# Patient Record
Sex: Female | Born: 1970
Health system: Southern US, Community
[De-identification: ages and names within clinical notes are randomized; demographics above are authoritative.]

## PROBLEM LIST (undated history)

## (undated) DIAGNOSIS — M797 Fibromyalgia: Secondary | ICD-10-CM

## (undated) DIAGNOSIS — F32A Depression, unspecified: Secondary | ICD-10-CM

## (undated) DIAGNOSIS — I2699 Other pulmonary embolism without acute cor pulmonale: Secondary | ICD-10-CM

## (undated) DIAGNOSIS — G479 Sleep disorder, unspecified: Secondary | ICD-10-CM

## (undated) DIAGNOSIS — M2550 Pain in unspecified joint: Secondary | ICD-10-CM

## (undated) DIAGNOSIS — F419 Anxiety disorder, unspecified: Secondary | ICD-10-CM

## (undated) DIAGNOSIS — M1990 Unspecified osteoarthritis, unspecified site: Secondary | ICD-10-CM

## (undated) DIAGNOSIS — Z973 Presence of spectacles and contact lenses: Secondary | ICD-10-CM

## (undated) DIAGNOSIS — I639 Cerebral infarction, unspecified: Secondary | ICD-10-CM

## (undated) DIAGNOSIS — G459 Transient cerebral ischemic attack, unspecified: Secondary | ICD-10-CM

## (undated) DIAGNOSIS — N200 Calculus of kidney: Secondary | ICD-10-CM

## (undated) DIAGNOSIS — Z87442 Personal history of urinary calculi: Secondary | ICD-10-CM

## (undated) DIAGNOSIS — R112 Nausea with vomiting, unspecified: Secondary | ICD-10-CM

## (undated) DIAGNOSIS — S161XXA Strain of muscle, fascia and tendon at neck level, initial encounter: Secondary | ICD-10-CM

## (undated) DIAGNOSIS — Z9989 Dependence on other enabling machines and devices: Principal | ICD-10-CM

## (undated) DIAGNOSIS — F329 Major depressive disorder, single episode, unspecified: Secondary | ICD-10-CM

## (undated) DIAGNOSIS — J189 Pneumonia, unspecified organism: Secondary | ICD-10-CM

## (undated) DIAGNOSIS — S0990XA Unspecified injury of head, initial encounter: Secondary | ICD-10-CM

## (undated) DIAGNOSIS — I82409 Acute embolism and thrombosis of unspecified deep veins of unspecified lower extremity: Secondary | ICD-10-CM

## (undated) DIAGNOSIS — R7989 Other specified abnormal findings of blood chemistry: Secondary | ICD-10-CM

## (undated) DIAGNOSIS — R21 Rash and other nonspecific skin eruption: Secondary | ICD-10-CM

## (undated) DIAGNOSIS — E1042 Type 1 diabetes mellitus with diabetic polyneuropathy: Secondary | ICD-10-CM

## (undated) DIAGNOSIS — K219 Gastro-esophageal reflux disease without esophagitis: Secondary | ICD-10-CM

## (undated) DIAGNOSIS — R945 Abnormal results of liver function studies: Secondary | ICD-10-CM

## (undated) DIAGNOSIS — K828 Other specified diseases of gallbladder: Secondary | ICD-10-CM

## (undated) DIAGNOSIS — E785 Hyperlipidemia, unspecified: Secondary | ICD-10-CM

## (undated) DIAGNOSIS — M6702 Short Achilles tendon (acquired), left ankle: Secondary | ICD-10-CM

## (undated) DIAGNOSIS — F4310 Post-traumatic stress disorder, unspecified: Secondary | ICD-10-CM

## (undated) DIAGNOSIS — G4733 Obstructive sleep apnea (adult) (pediatric): Secondary | ICD-10-CM

## (undated) DIAGNOSIS — E162 Hypoglycemia, unspecified: Secondary | ICD-10-CM

## (undated) HISTORY — DX: Obstructive sleep apnea (adult) (pediatric): G47.33

## (undated) HISTORY — DX: Nausea with vomiting, unspecified: R11.2

## (undated) HISTORY — DX: Hyperlipidemia, unspecified: E78.5

## (undated) HISTORY — PX: COLONOSCOPY: SHX174

## (undated) HISTORY — PX: LIVER BIOPSY: SHX301

## (undated) HISTORY — PX: APPENDECTOMY: SHX54

## (undated) HISTORY — DX: Dependence on other enabling machines and devices: Z99.89

## (undated) HISTORY — PX: OTHER SURGICAL HISTORY: SHX169

## (undated) HISTORY — DX: Anxiety disorder, unspecified: F41.9

## (undated) HISTORY — DX: Other specified diseases of gallbladder: K82.8

## (undated) HISTORY — PX: DILATION AND CURETTAGE OF UTERUS: SHX78

---

## 1997-03-29 ENCOUNTER — Ambulatory Visit (HOSPITAL_COMMUNITY): Admission: RE | Admit: 1997-03-29 | Discharge: 1997-03-29 | Payer: Self-pay | Admitting: Obstetrics & Gynecology

## 1997-04-07 ENCOUNTER — Inpatient Hospital Stay (HOSPITAL_COMMUNITY): Admission: AD | Admit: 1997-04-07 | Discharge: 1997-04-07 | Payer: Self-pay | Admitting: *Deleted

## 1997-04-19 ENCOUNTER — Encounter: Admission: RE | Admit: 1997-04-19 | Discharge: 1997-07-18 | Payer: Self-pay | Admitting: Obstetrics

## 1997-04-24 ENCOUNTER — Inpatient Hospital Stay (HOSPITAL_COMMUNITY): Admission: AD | Admit: 1997-04-24 | Discharge: 1997-04-24 | Payer: Self-pay | Admitting: *Deleted

## 1997-05-02 ENCOUNTER — Ambulatory Visit (HOSPITAL_COMMUNITY): Admission: RE | Admit: 1997-05-02 | Discharge: 1997-05-02 | Payer: Self-pay | Admitting: Obstetrics and Gynecology

## 1997-07-11 ENCOUNTER — Inpatient Hospital Stay (HOSPITAL_COMMUNITY): Admission: AD | Admit: 1997-07-11 | Discharge: 1997-07-11 | Payer: Self-pay | Admitting: Obstetrics

## 1997-07-12 ENCOUNTER — Ambulatory Visit (HOSPITAL_COMMUNITY): Admission: RE | Admit: 1997-07-12 | Discharge: 1997-07-12 | Payer: Self-pay | Admitting: Obstetrics

## 1997-08-01 ENCOUNTER — Encounter: Admission: RE | Admit: 1997-08-01 | Discharge: 1997-10-30 | Payer: Self-pay | Admitting: Obstetrics & Gynecology

## 1997-08-06 ENCOUNTER — Encounter (HOSPITAL_COMMUNITY): Admission: RE | Admit: 1997-08-06 | Discharge: 1997-08-28 | Payer: Self-pay | Admitting: Obstetrics

## 1997-08-07 ENCOUNTER — Inpatient Hospital Stay (HOSPITAL_COMMUNITY): Admission: AD | Admit: 1997-08-07 | Discharge: 1997-08-07 | Payer: Self-pay | Admitting: *Deleted

## 1997-08-08 ENCOUNTER — Inpatient Hospital Stay (HOSPITAL_COMMUNITY): Admission: AD | Admit: 1997-08-08 | Discharge: 1997-08-13 | Payer: Self-pay | Admitting: Obstetrics

## 1997-08-17 ENCOUNTER — Ambulatory Visit (HOSPITAL_COMMUNITY): Admission: RE | Admit: 1997-08-17 | Discharge: 1997-08-17 | Payer: Self-pay | Admitting: Obstetrics & Gynecology

## 1997-08-21 ENCOUNTER — Observation Stay (HOSPITAL_COMMUNITY): Admission: AD | Admit: 1997-08-21 | Discharge: 1997-08-22 | Payer: Self-pay | Admitting: *Deleted

## 1997-08-25 ENCOUNTER — Inpatient Hospital Stay (HOSPITAL_COMMUNITY): Admission: AD | Admit: 1997-08-25 | Discharge: 1997-08-31 | Payer: Self-pay | Admitting: *Deleted

## 1997-10-16 ENCOUNTER — Encounter: Admission: RE | Admit: 1997-10-16 | Discharge: 1997-10-16 | Payer: Self-pay | Admitting: Obstetrics & Gynecology

## 1997-10-16 ENCOUNTER — Other Ambulatory Visit: Admission: RE | Admit: 1997-10-16 | Discharge: 1997-10-16 | Payer: Self-pay | Admitting: Obstetrics & Gynecology

## 1997-11-09 ENCOUNTER — Emergency Department (HOSPITAL_COMMUNITY): Admission: EM | Admit: 1997-11-09 | Discharge: 1997-11-09 | Payer: Self-pay | Admitting: Emergency Medicine

## 1997-12-22 ENCOUNTER — Emergency Department (HOSPITAL_COMMUNITY): Admission: EM | Admit: 1997-12-22 | Discharge: 1997-12-22 | Payer: Self-pay | Admitting: Emergency Medicine

## 1998-05-12 ENCOUNTER — Emergency Department (HOSPITAL_COMMUNITY): Admission: EM | Admit: 1998-05-12 | Discharge: 1998-05-12 | Payer: Self-pay | Admitting: Emergency Medicine

## 1998-05-15 ENCOUNTER — Encounter: Admission: RE | Admit: 1998-05-15 | Discharge: 1998-08-13 | Payer: Self-pay | Admitting: *Deleted

## 1999-01-20 HISTORY — PX: TONSILLECTOMY: SUR1361

## 1999-01-20 HISTORY — PX: ABDOMINAL HYSTERECTOMY: SHX81

## 1999-03-29 ENCOUNTER — Emergency Department (HOSPITAL_COMMUNITY): Admission: EM | Admit: 1999-03-29 | Discharge: 1999-03-29 | Payer: Self-pay | Admitting: Emergency Medicine

## 1999-04-18 ENCOUNTER — Emergency Department (HOSPITAL_COMMUNITY): Admission: EM | Admit: 1999-04-18 | Discharge: 1999-04-18 | Payer: Self-pay | Admitting: Emergency Medicine

## 2000-02-26 ENCOUNTER — Encounter: Payer: Self-pay | Admitting: Emergency Medicine

## 2000-02-26 ENCOUNTER — Emergency Department (HOSPITAL_COMMUNITY): Admission: EM | Admit: 2000-02-26 | Discharge: 2000-02-26 | Payer: Self-pay | Admitting: Emergency Medicine

## 2000-05-22 ENCOUNTER — Emergency Department (HOSPITAL_COMMUNITY): Admission: EM | Admit: 2000-05-22 | Discharge: 2000-05-22 | Payer: Self-pay | Admitting: Emergency Medicine

## 2000-11-03 ENCOUNTER — Emergency Department (HOSPITAL_COMMUNITY): Admission: EM | Admit: 2000-11-03 | Discharge: 2000-11-03 | Payer: Self-pay | Admitting: Emergency Medicine

## 2001-01-02 ENCOUNTER — Emergency Department (HOSPITAL_COMMUNITY): Admission: EM | Admit: 2001-01-02 | Discharge: 2001-01-02 | Payer: Self-pay | Admitting: Emergency Medicine

## 2001-01-02 ENCOUNTER — Encounter: Payer: Self-pay | Admitting: Emergency Medicine

## 2001-02-24 ENCOUNTER — Ambulatory Visit (HOSPITAL_COMMUNITY): Admission: RE | Admit: 2001-02-24 | Discharge: 2001-02-24 | Payer: Self-pay | Admitting: General Surgery

## 2001-02-24 ENCOUNTER — Encounter: Payer: Self-pay | Admitting: General Surgery

## 2001-04-25 ENCOUNTER — Ambulatory Visit (HOSPITAL_BASED_OUTPATIENT_CLINIC_OR_DEPARTMENT_OTHER): Admission: RE | Admit: 2001-04-25 | Discharge: 2001-04-25 | Payer: Self-pay | Admitting: General Surgery

## 2001-04-25 ENCOUNTER — Encounter (INDEPENDENT_AMBULATORY_CARE_PROVIDER_SITE_OTHER): Payer: Self-pay | Admitting: *Deleted

## 2001-05-18 ENCOUNTER — Emergency Department (HOSPITAL_COMMUNITY): Admission: EM | Admit: 2001-05-18 | Discharge: 2001-05-18 | Payer: Self-pay | Admitting: Emergency Medicine

## 2004-01-20 HISTORY — PX: EYE SURGERY: SHX253

## 2005-02-09 ENCOUNTER — Emergency Department (HOSPITAL_COMMUNITY): Admission: EM | Admit: 2005-02-09 | Discharge: 2005-02-10 | Payer: Self-pay | Admitting: *Deleted

## 2005-02-18 ENCOUNTER — Other Ambulatory Visit: Admission: RE | Admit: 2005-02-18 | Discharge: 2005-02-18 | Payer: Self-pay | Admitting: Gynecology

## 2005-03-27 ENCOUNTER — Ambulatory Visit (HOSPITAL_COMMUNITY): Admission: RE | Admit: 2005-03-27 | Discharge: 2005-03-27 | Payer: Self-pay | Admitting: *Deleted

## 2005-03-30 ENCOUNTER — Ambulatory Visit (HOSPITAL_COMMUNITY): Admission: RE | Admit: 2005-03-30 | Discharge: 2005-03-31 | Payer: Self-pay | Admitting: *Deleted

## 2005-03-30 ENCOUNTER — Encounter (INDEPENDENT_AMBULATORY_CARE_PROVIDER_SITE_OTHER): Payer: Self-pay | Admitting: *Deleted

## 2005-05-15 ENCOUNTER — Emergency Department (HOSPITAL_COMMUNITY): Admission: EM | Admit: 2005-05-15 | Discharge: 2005-05-15 | Payer: Self-pay | Admitting: Emergency Medicine

## 2005-05-16 ENCOUNTER — Ambulatory Visit (HOSPITAL_COMMUNITY): Admission: RE | Admit: 2005-05-16 | Discharge: 2005-05-16 | Payer: Self-pay | Admitting: Emergency Medicine

## 2005-05-16 ENCOUNTER — Encounter: Payer: Self-pay | Admitting: Vascular Surgery

## 2005-10-03 ENCOUNTER — Emergency Department (HOSPITAL_COMMUNITY): Admission: EM | Admit: 2005-10-03 | Discharge: 2005-10-03 | Payer: Self-pay | Admitting: Emergency Medicine

## 2005-10-16 ENCOUNTER — Encounter (INDEPENDENT_AMBULATORY_CARE_PROVIDER_SITE_OTHER): Payer: Self-pay | Admitting: Specialist

## 2005-10-16 ENCOUNTER — Ambulatory Visit (HOSPITAL_COMMUNITY): Admission: RE | Admit: 2005-10-16 | Discharge: 2005-10-17 | Payer: Self-pay | Admitting: Otolaryngology

## 2005-11-15 ENCOUNTER — Emergency Department (HOSPITAL_COMMUNITY): Admission: EM | Admit: 2005-11-15 | Discharge: 2005-11-15 | Payer: Self-pay | Admitting: Emergency Medicine

## 2005-12-14 ENCOUNTER — Encounter (INDEPENDENT_AMBULATORY_CARE_PROVIDER_SITE_OTHER): Payer: Self-pay | Admitting: Cardiovascular Disease

## 2005-12-14 ENCOUNTER — Observation Stay (HOSPITAL_COMMUNITY): Admission: EM | Admit: 2005-12-14 | Discharge: 2005-12-15 | Payer: Self-pay | Admitting: *Deleted

## 2005-12-14 ENCOUNTER — Ambulatory Visit: Payer: Self-pay | Admitting: *Deleted

## 2005-12-24 ENCOUNTER — Emergency Department (HOSPITAL_COMMUNITY): Admission: EM | Admit: 2005-12-24 | Discharge: 2005-12-24 | Payer: Self-pay | Admitting: Family Medicine

## 2006-01-16 ENCOUNTER — Emergency Department (HOSPITAL_COMMUNITY): Admission: EM | Admit: 2006-01-16 | Discharge: 2006-01-16 | Payer: Self-pay | Admitting: Emergency Medicine

## 2006-05-10 ENCOUNTER — Emergency Department (HOSPITAL_COMMUNITY): Admission: EM | Admit: 2006-05-10 | Discharge: 2006-05-10 | Payer: Self-pay | Admitting: Emergency Medicine

## 2006-05-13 ENCOUNTER — Emergency Department (HOSPITAL_COMMUNITY): Admission: EM | Admit: 2006-05-13 | Discharge: 2006-05-13 | Payer: Self-pay | Admitting: Emergency Medicine

## 2006-07-22 ENCOUNTER — Emergency Department (HOSPITAL_COMMUNITY): Admission: EM | Admit: 2006-07-22 | Discharge: 2006-07-23 | Payer: Self-pay | Admitting: Emergency Medicine

## 2006-08-24 ENCOUNTER — Emergency Department (HOSPITAL_COMMUNITY): Admission: EM | Admit: 2006-08-24 | Discharge: 2006-08-24 | Payer: Self-pay | Admitting: Emergency Medicine

## 2006-08-28 ENCOUNTER — Emergency Department (HOSPITAL_COMMUNITY): Admission: EM | Admit: 2006-08-28 | Discharge: 2006-08-28 | Payer: Self-pay | Admitting: Family Medicine

## 2006-12-13 ENCOUNTER — Emergency Department (HOSPITAL_COMMUNITY): Admission: EM | Admit: 2006-12-13 | Discharge: 2006-12-13 | Payer: Self-pay | Admitting: Family Medicine

## 2006-12-14 IMAGING — CT CT ABDOMEN W/ CM
3 of 5 series · 14 of 32 positions shown, 19 images · IV contrast (ORAL OMNI 350 & 100 ML OMNI 300)
Comparison: 11/03/00.

CLINICAL DATA: Abdominal pain.  Diabetic.  Patient with back pain.
ABDOMEN CT WITH CONTRAST:
TECHNIQUE: Multidetector CT imaging of the abdomen was performed following the standard protocol during bolus administration of intravenous contrast.
Contrast:  100 cc Omnipaque 300.
TECHNIQUE: Multidetector CT imaging of the pelvis was performed following the standard protocol during bolus administration of intravenous contrast.

[Series 2: routine abdomen · axial · 0.70mm/px · z∈[-355,-115]mm · 4 of 82 slices shown, 9 images]
[im 17/82  soft-tissue]
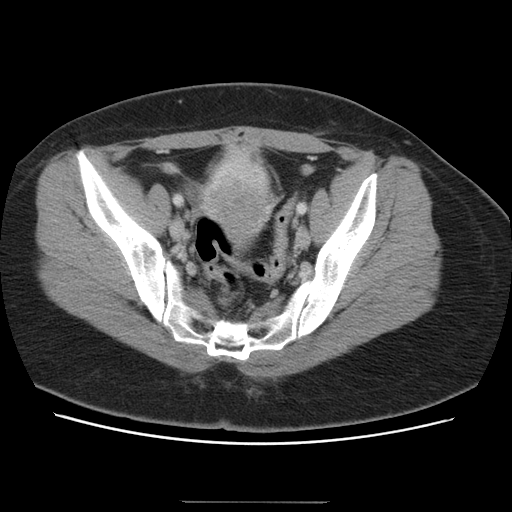
[im 17/82  lung]
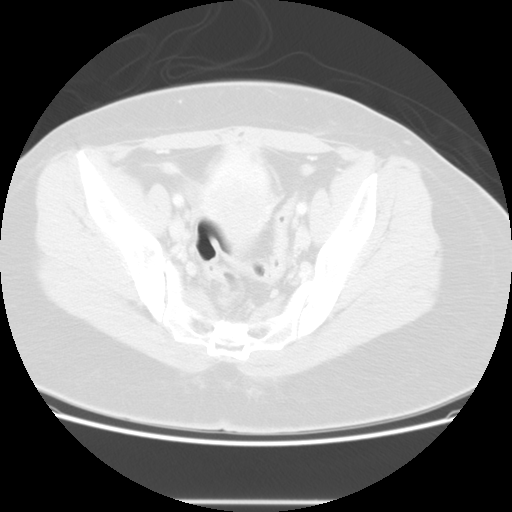
[im 17/82  bone]
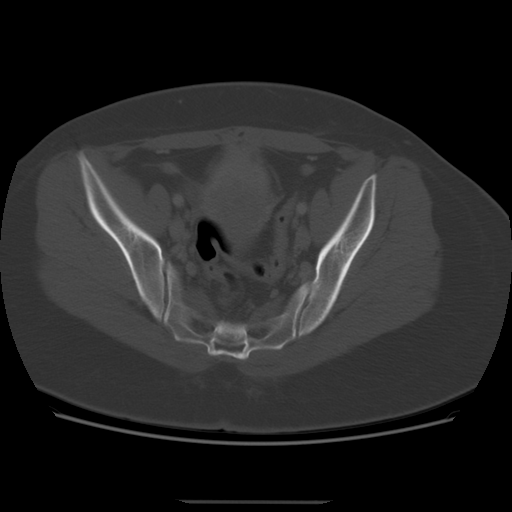
[im 33/82  soft-tissue]
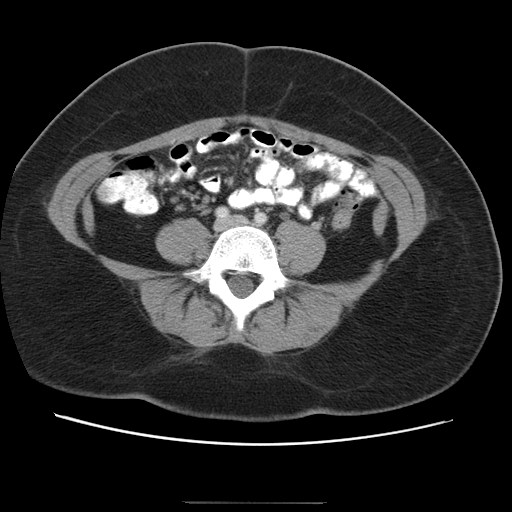
[im 33/82  lung]
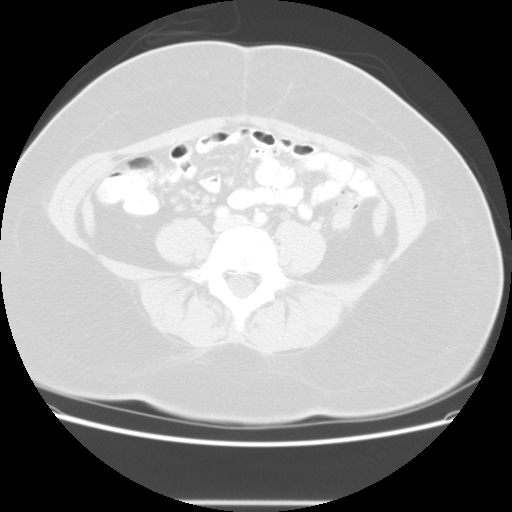
[im 49/82  soft-tissue]
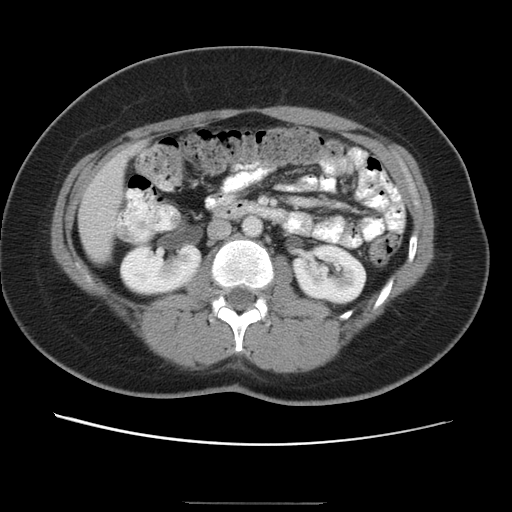
[im 49/82  lung]
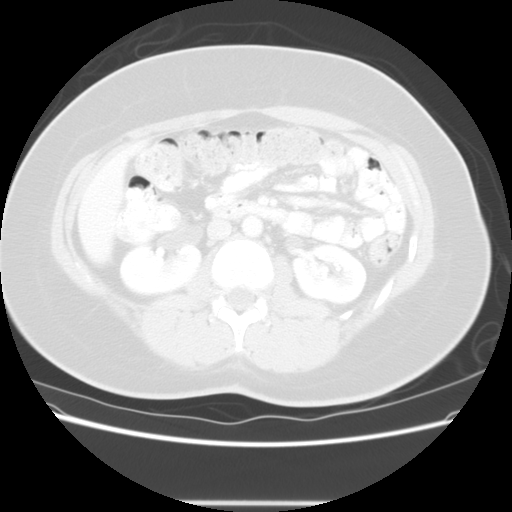
[im 65/82  soft-tissue]
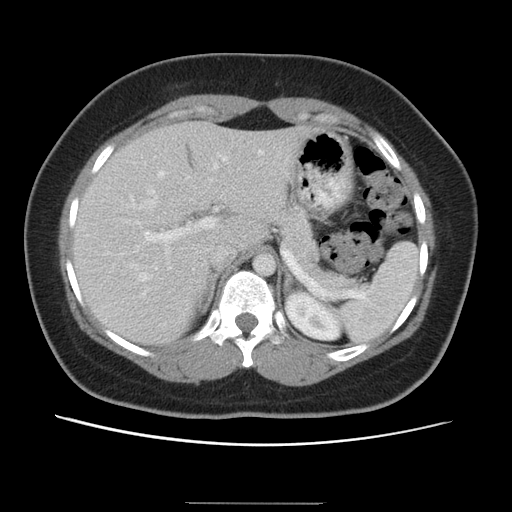
[im 65/82  lung]
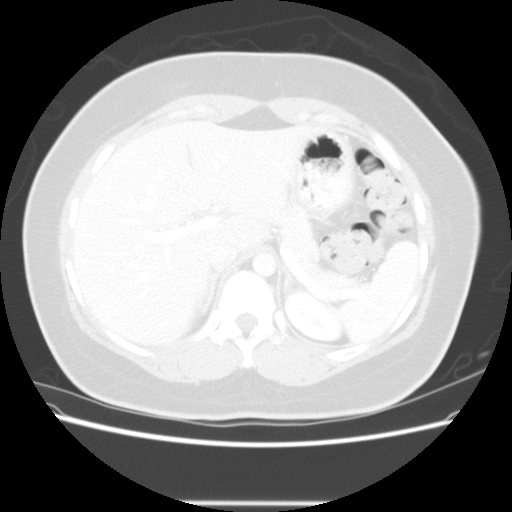

[Series 104: reformatted · sagittal · 0.88mm/px · 8 of 149 slices shown (1 of 2)]
[im 14/149  soft-tissue]
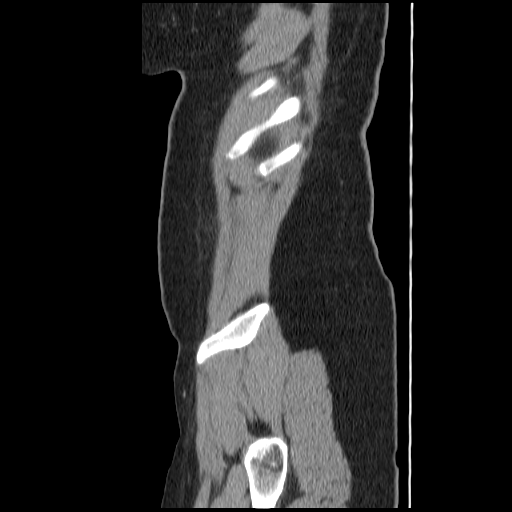
[im 27/149  soft-tissue]
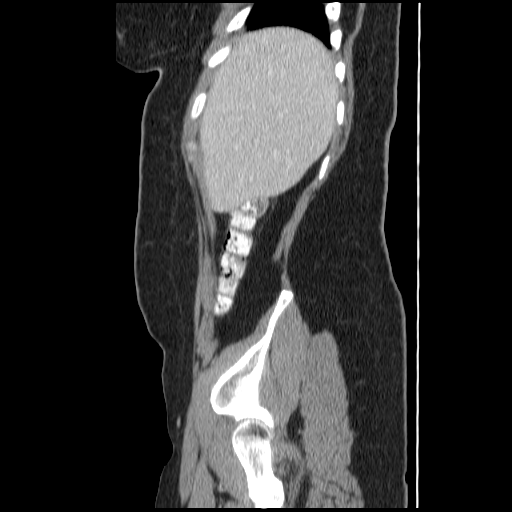
[im 54/149  soft-tissue]
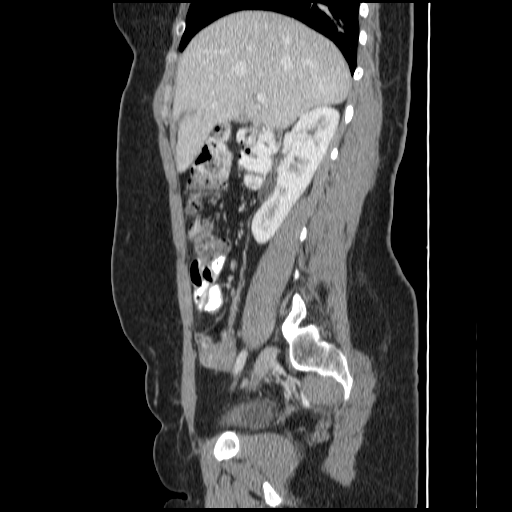
[im 68/149  soft-tissue]
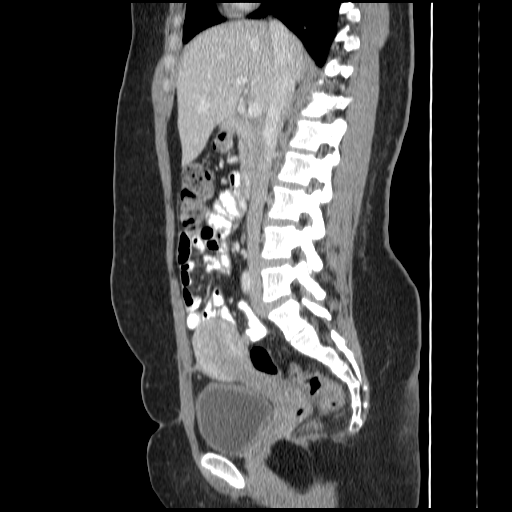
[im 81/149  soft-tissue]
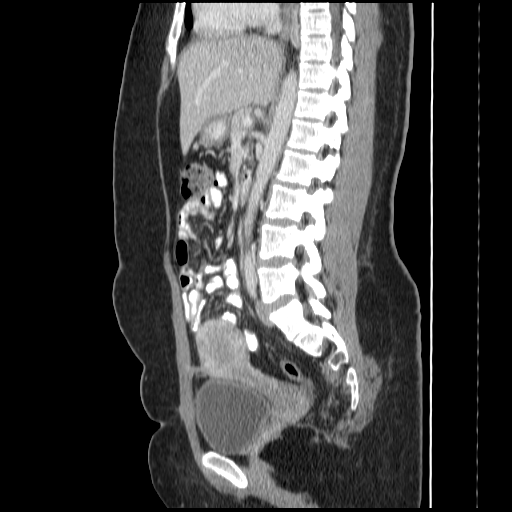
[im 95/149  soft-tissue]
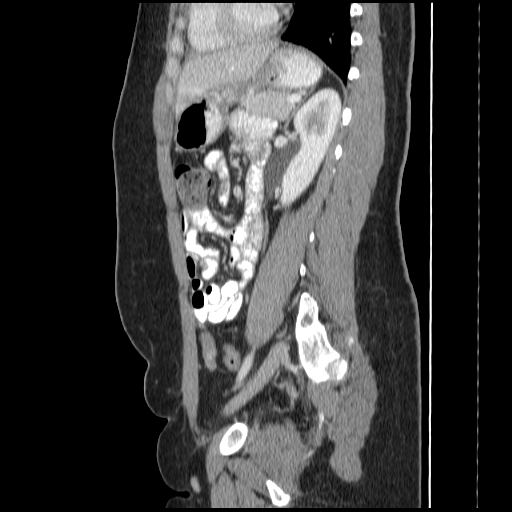
[im 122/149  soft-tissue]
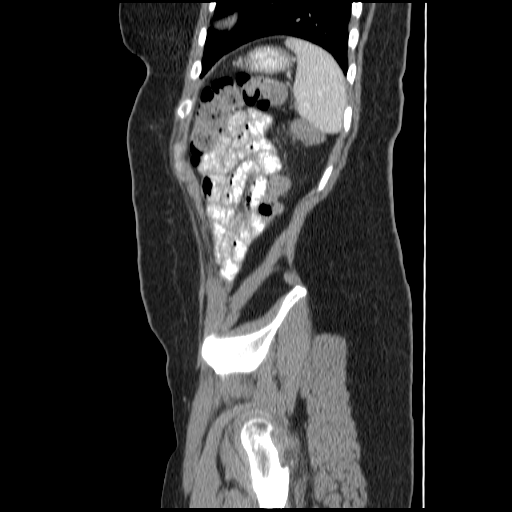
[im 135/149  soft-tissue]
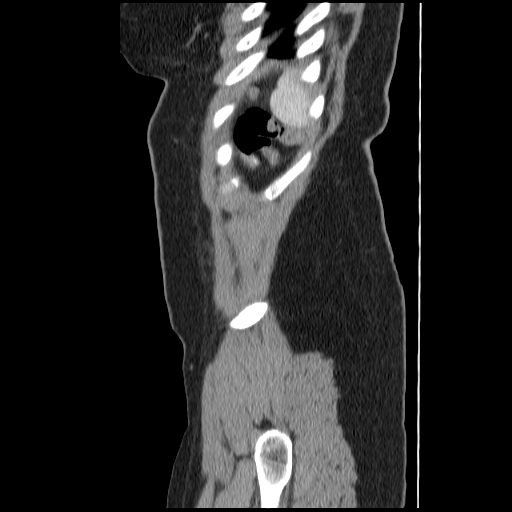

[Series 105: reformatted · coronal · 0.88mm/px · 2 of 111 slices shown (2 of 2)]
[im 14/111  soft-tissue]
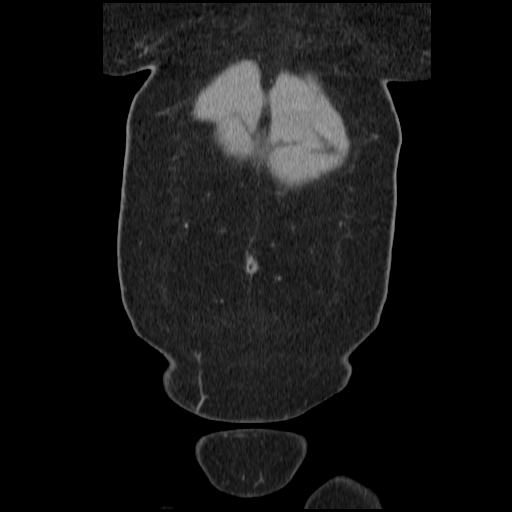
[im 28/111  soft-tissue]
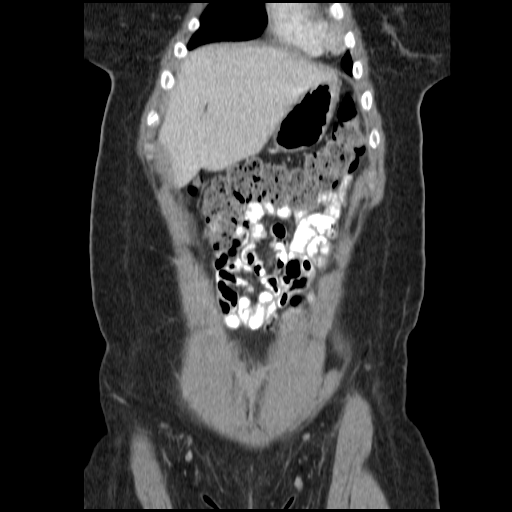

[14 of 32 positions shown; findings below may reference images not displayed]

FINDINGS: There is minimal dependent bibasilar subsegmental atelectasis.  No pleural or pericardial effusion.
The spleen, adrenal glands, gallbladder, pancreas, and liver all appear normal.  Right kidney demonstrates a somewhat anterior axis rotation, but is otherwise unremarkable.   Left kidney appears normal.  No abdominal lymphadenopathy or fluid collections.
IMPRESSION: Negative abdomen CT scan.
PELVIS CT WITH CONTRAST:
FINDINGS: A small amount of free pelvic fluid is identified which is likely physiologic.  Several small lymph nodes are identified in the right lower quadrant.  The appendix is visualized and is unremarkable.  No focal bony abnormality is identified.
IMPRESSION: No acute finding in the pelvis.

## 2007-01-28 IMAGING — CR DG CHEST 2V
2 series · 2 of 2 positions shown · non-contrast
Comparison: None.

CLINICAL DATA: Preoperative respiratory evaluation prior to gynecologic surgery.

CHEST - 2 VIEW  03/27/2005:

[view not recorded (1 of 2)]
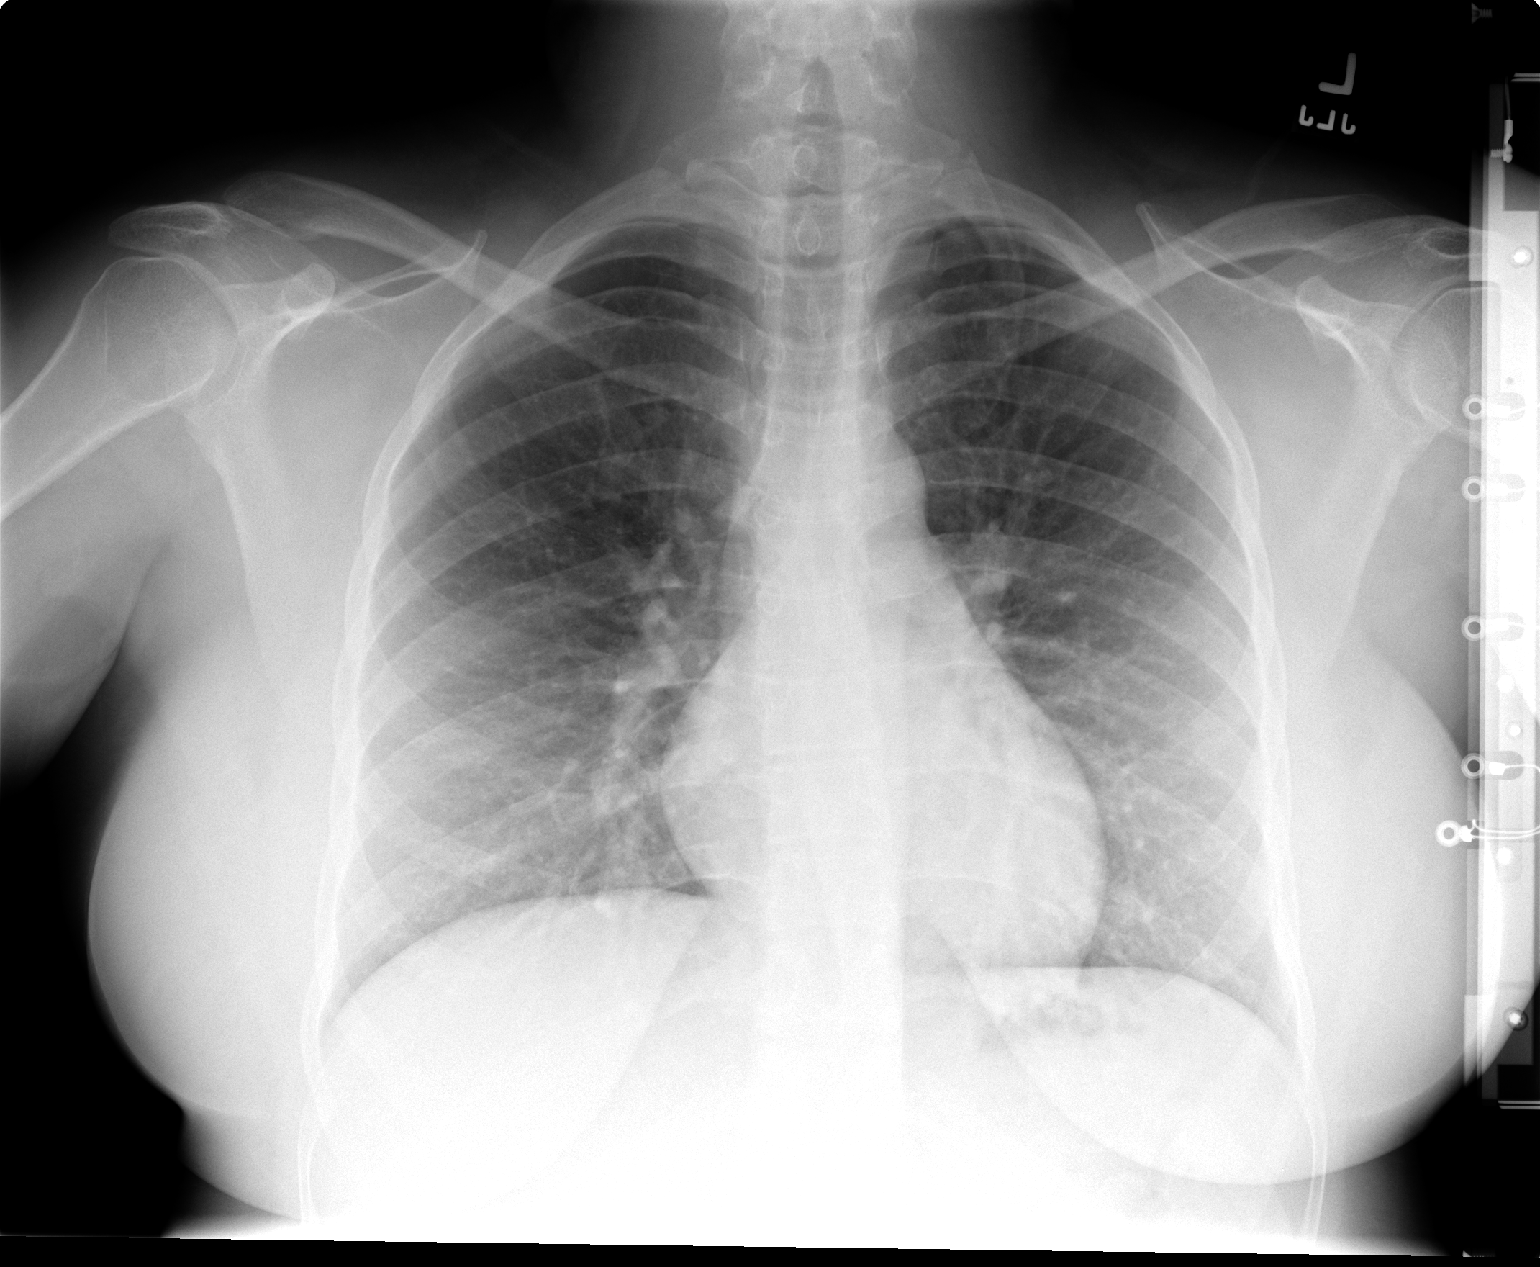

[view not recorded (2 of 2)]
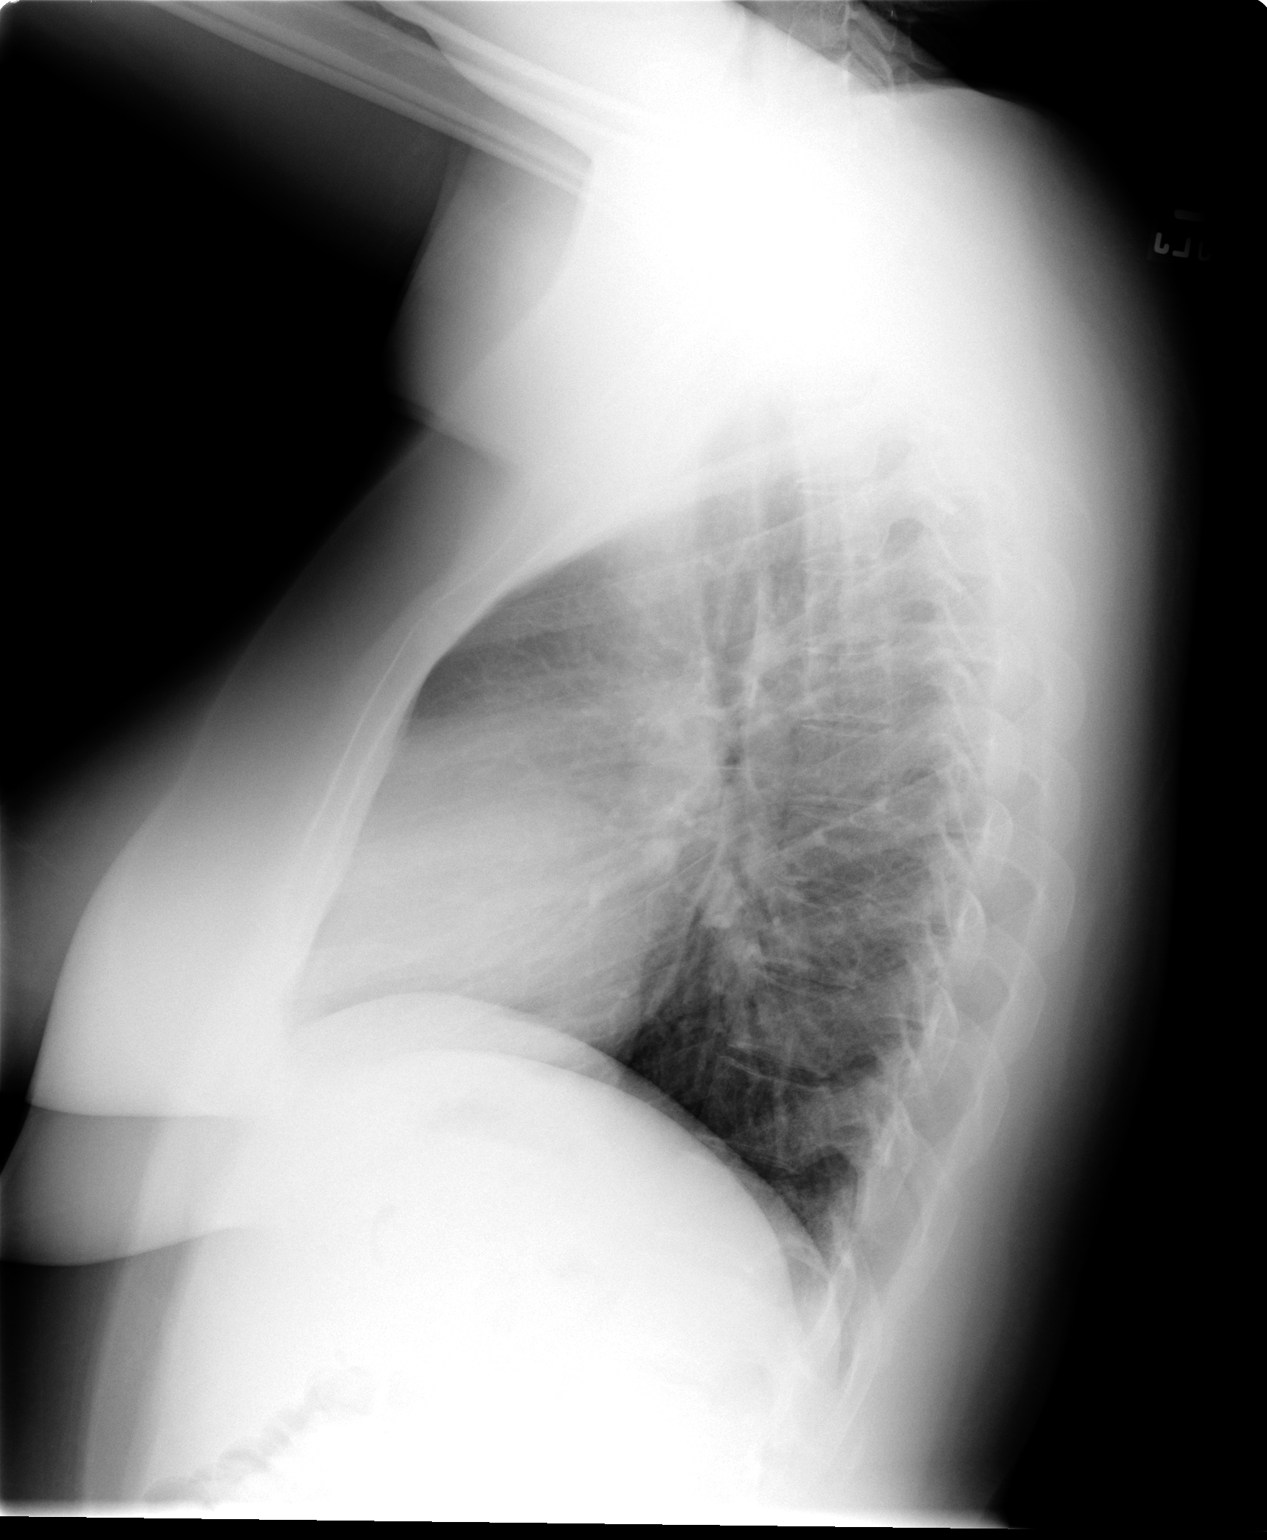

[2 of 2 positions shown; findings below may reference images not displayed]

FINDINGS: The cardiomediastinal silhouette is unremarkable. The lungs are
clear. There are no pleural effusions. The visualized bony thorax appears
intact.
IMPRESSION: Normal chest.

## 2007-01-28 IMAGING — CT CT PELVIS W/ CM
1 of 4 series · 14 of 32 positions shown, 19 images · IV contrast (omnipaque)
Comparison: 02/10/2005

ABDOMEN CT WITH CONTRAST

CLINICAL DATA: Elevated LFTs. Abnormal uterine bleeding with fibroids. Pelvic
pain.
TECHNIQUE: Multidetector CT imaging of the abdomen and pelvis was performed
following the standard protocol during bolus administration of intravenous
contrast.

Contrast:  125 cc Omnipaque 300

[Series 2: abd/pel 5.0 b30f · axial · 0.68mm/px · z∈[-438,-44]mm · 14 of 91 slices shown, 19 images]
[im 6/91  soft-tissue]
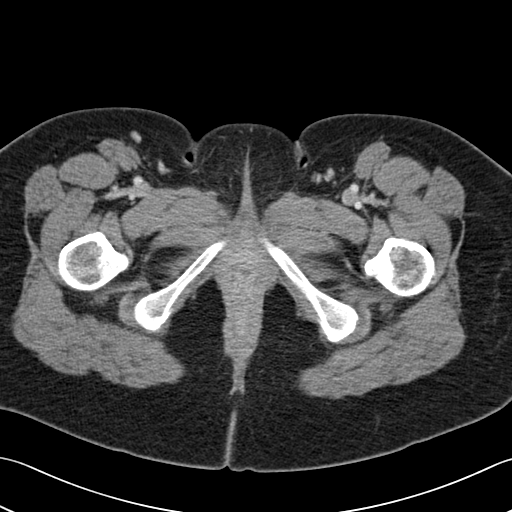
[im 6/91  bone]
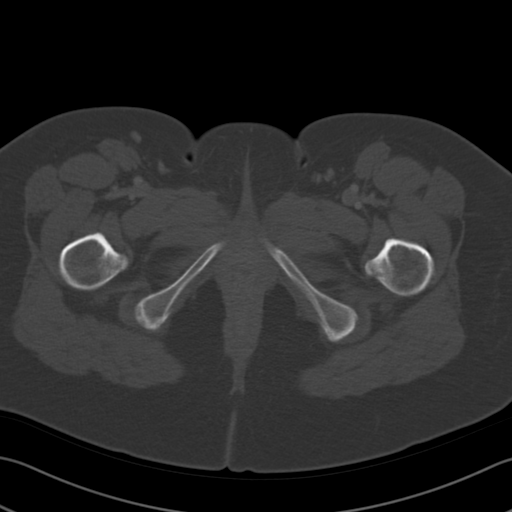
[im 12/91  soft-tissue]
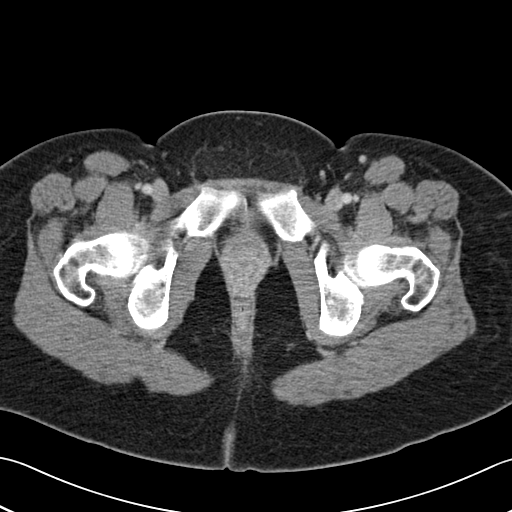
[im 17/91  soft-tissue]
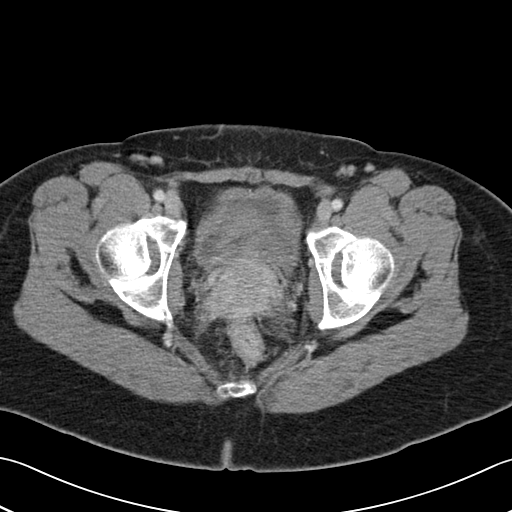
[im 29/91  soft-tissue]
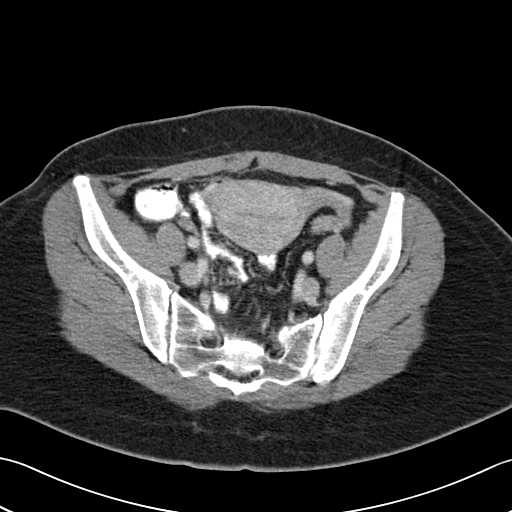
[im 34/91  soft-tissue]
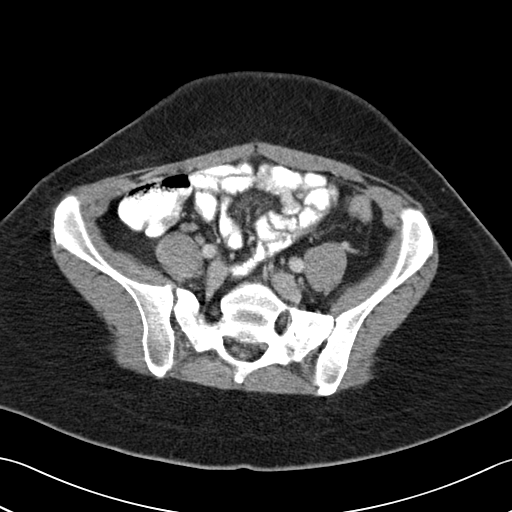
[im 40/91  soft-tissue]
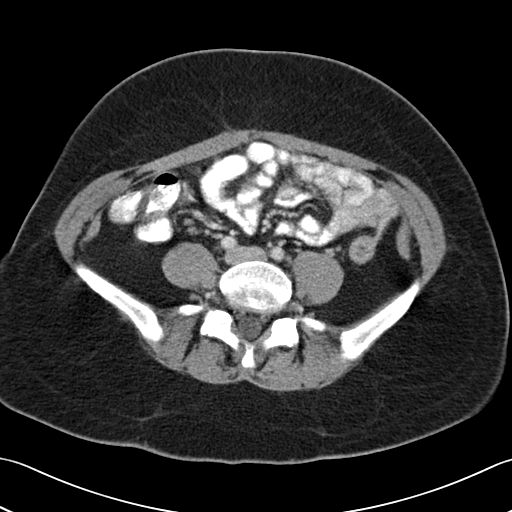
[im 46/91  soft-tissue]
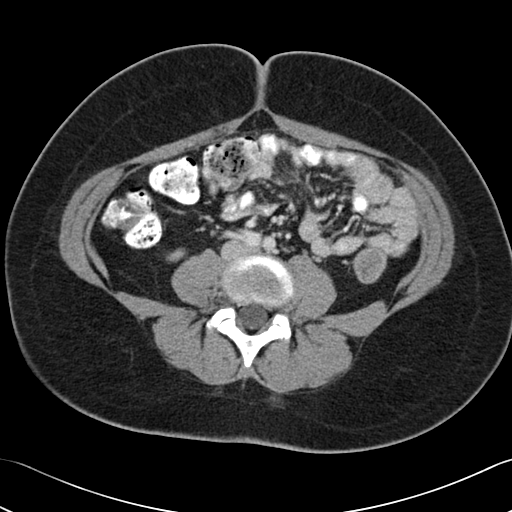
[im 51/91  soft-tissue]
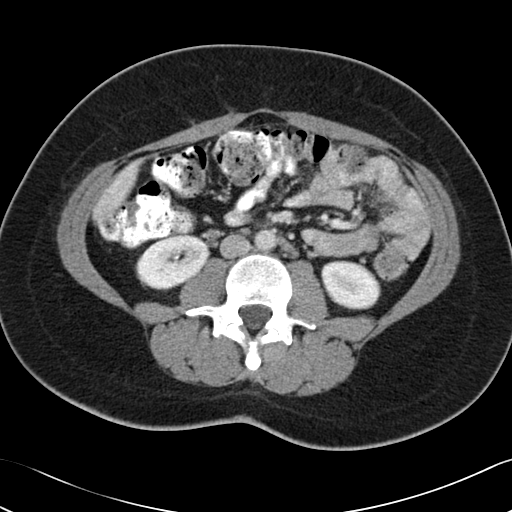
[im 57/91  soft-tissue]
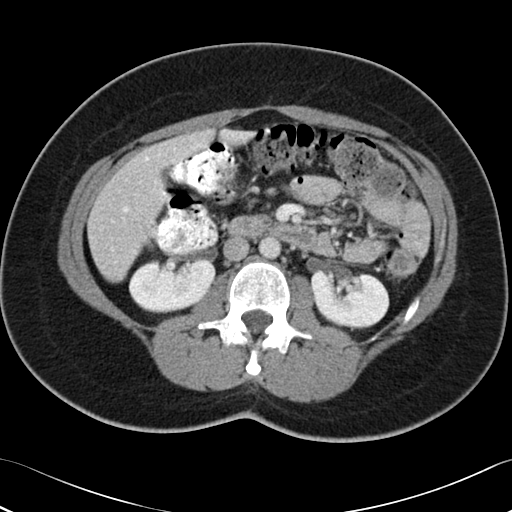
[im 57/91  bone]
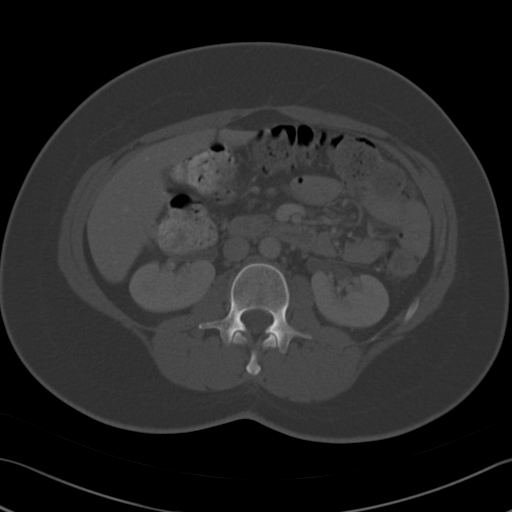
[im 62/91  soft-tissue]
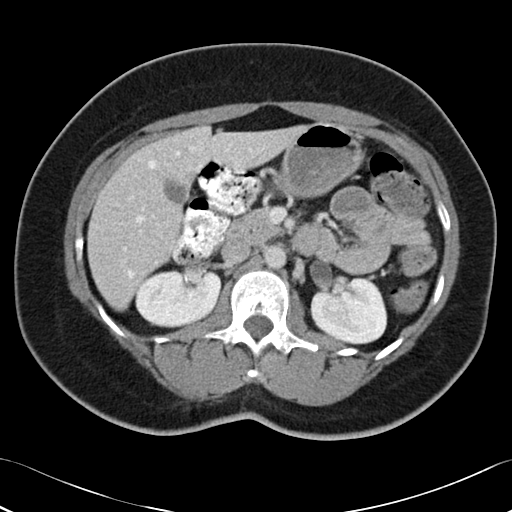
[im 68/91  lung]
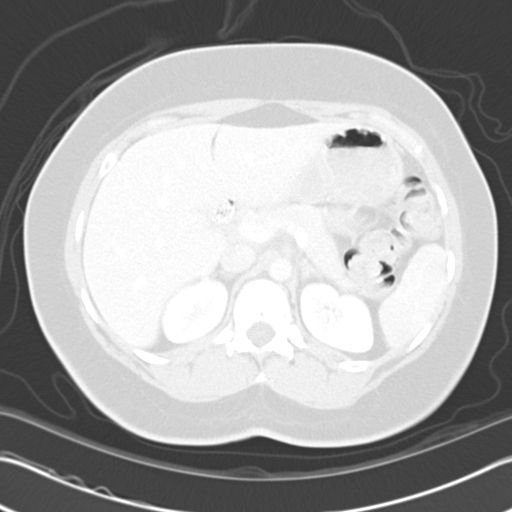
[im 74/91  soft-tissue]
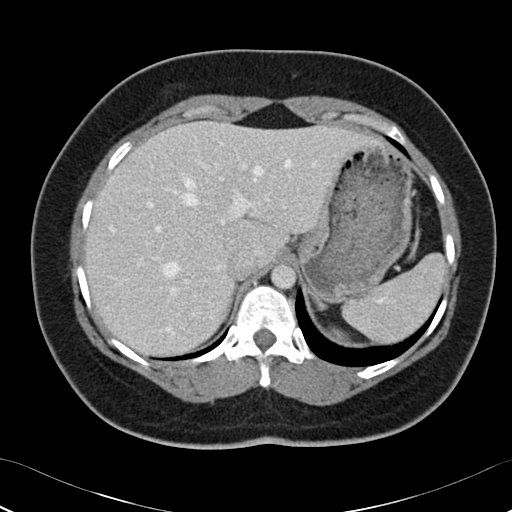
[im 74/91  lung]
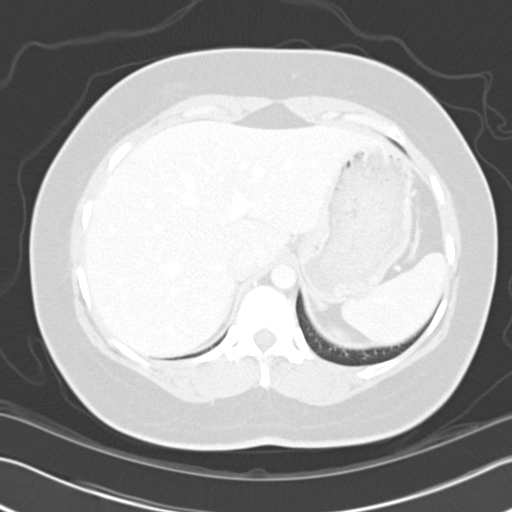
[im 79/91  soft-tissue]
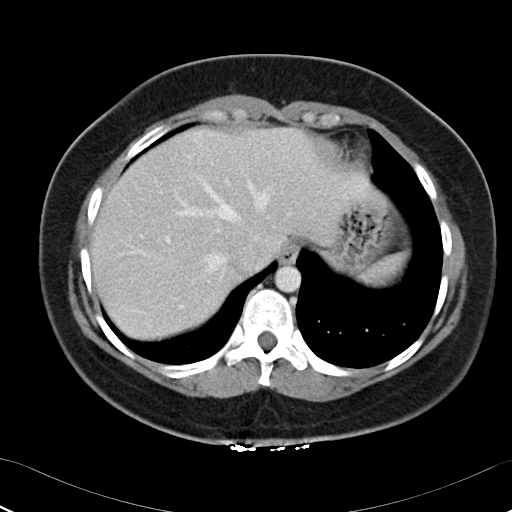
[im 79/91  lung]
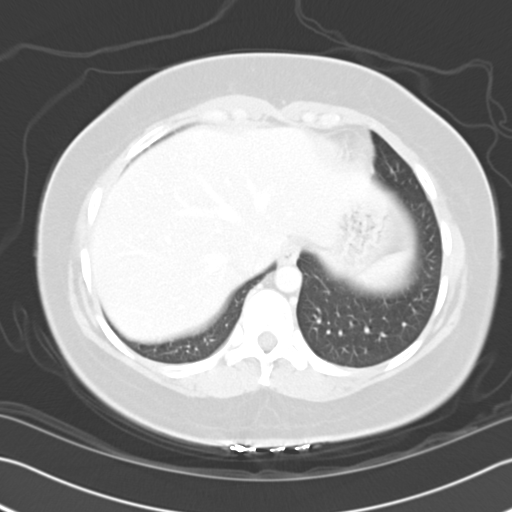
[im 85/91  soft-tissue]
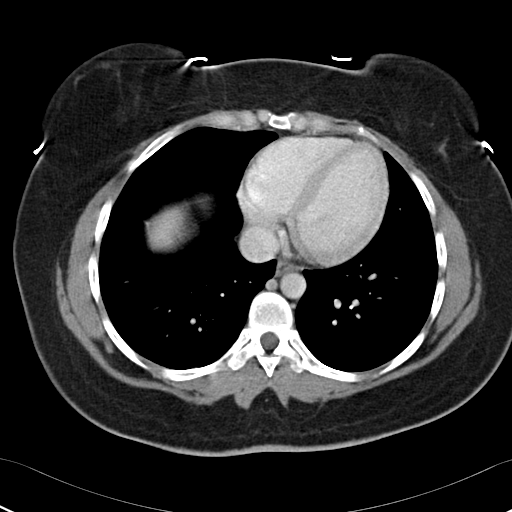
[im 85/91  lung]
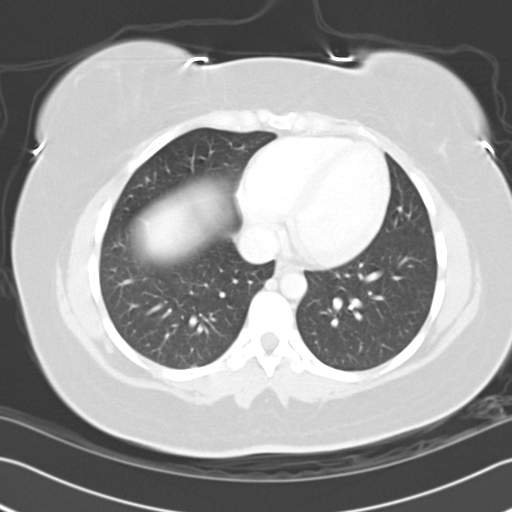

[14 of 32 positions shown; findings below may reference images not displayed]

FINDINGS: Liver, spleen, pancreas, adrenals, kidneys unremarkable. Bowel and
gallbladder grossly unremarkable. No free fluid, free air, or adenopathy.
Visualized skeleton and lung base is unremarkable.

IMPRESSION

No acute findings in the abdomen.

PELVIS CT WITH CONTRAST
FINDINGS: Uterus and adnexa have an unremarkable CT appearance. Small amount of
free fluid in the pelvis. Appendix is normal.

IMPRESSION

Small amount of free fluid, likely physiologic. No acute findings.

## 2007-02-02 ENCOUNTER — Ambulatory Visit (HOSPITAL_COMMUNITY): Admission: RE | Admit: 2007-02-02 | Discharge: 2007-02-03 | Payer: Self-pay | Admitting: Emergency Medicine

## 2007-02-24 ENCOUNTER — Emergency Department (HOSPITAL_COMMUNITY): Admission: EM | Admit: 2007-02-24 | Discharge: 2007-02-24 | Payer: Self-pay | Admitting: Emergency Medicine

## 2007-03-03 ENCOUNTER — Encounter: Admission: RE | Admit: 2007-03-03 | Discharge: 2007-03-03 | Payer: Self-pay | Admitting: Gastroenterology

## 2007-03-07 ENCOUNTER — Emergency Department (HOSPITAL_COMMUNITY): Admission: EM | Admit: 2007-03-07 | Discharge: 2007-03-07 | Payer: Self-pay | Admitting: Emergency Medicine

## 2008-01-04 ENCOUNTER — Ambulatory Visit: Payer: Self-pay | Admitting: Internal Medicine

## 2008-01-04 ENCOUNTER — Observation Stay (HOSPITAL_COMMUNITY): Admission: EM | Admit: 2008-01-04 | Discharge: 2008-01-05 | Payer: Self-pay | Admitting: Emergency Medicine

## 2008-01-20 DIAGNOSIS — S0990XA Unspecified injury of head, initial encounter: Secondary | ICD-10-CM

## 2008-01-20 HISTORY — DX: Unspecified injury of head, initial encounter: S09.90XA

## 2008-03-02 ENCOUNTER — Encounter: Payer: Self-pay | Admitting: Emergency Medicine

## 2008-03-02 ENCOUNTER — Ambulatory Visit: Payer: Self-pay | Admitting: Surgery

## 2008-03-02 ENCOUNTER — Ambulatory Visit (HOSPITAL_COMMUNITY): Admission: RE | Admit: 2008-03-02 | Discharge: 2008-03-02 | Payer: Self-pay | Admitting: Emergency Medicine

## 2008-03-12 IMAGING — CT CT ABDOMEN W/O CM
2 of 4 series · 13 of 32 positions shown, 18 images · IV contrast (agent unspecified)
Comparison: 03/27/05.

CLINICAL DATA: Acute onset of right back and flank pain.
 ABDOMEN CT WITHOUT CONTRAST:
TECHNIQUE: Multidetector CT imaging of the abdomen was performed following the standard protocol without IV contrast.   Routine stone protocol with no oral or IV contrast.
TECHNIQUE: Multidetector CT imaging of the pelvis was performed following the standard protocol without IV contrast.

[Series 2: abd pelvis · axial · 0.70mm/px · z∈[-385,-75]mm · 7 of 84 slices shown, 12 images]
[im 11/84  soft-tissue]
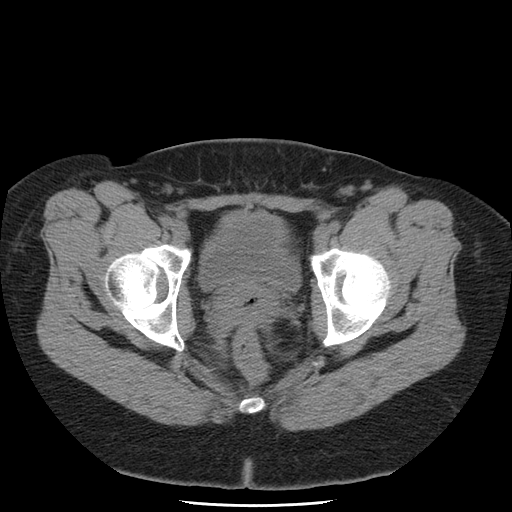
[im 11/84  bone]
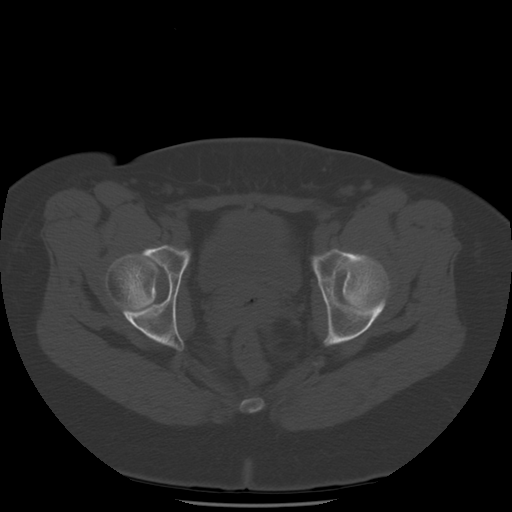
[im 21/84  soft-tissue]
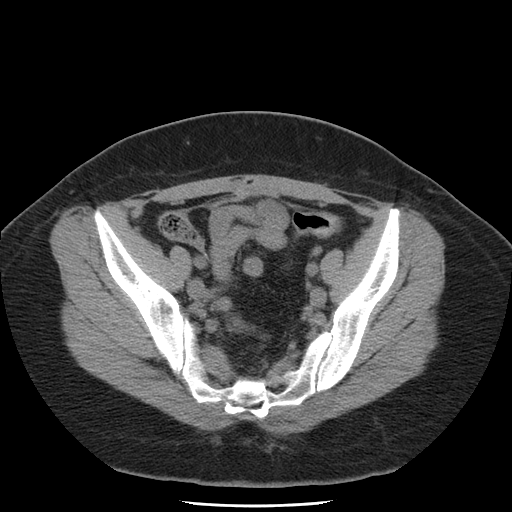
[im 32/84  soft-tissue]
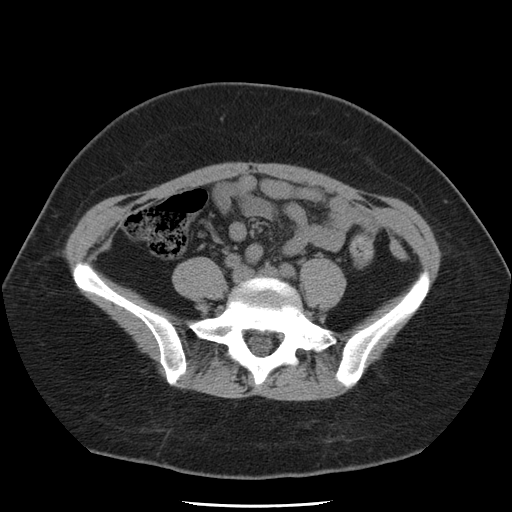
[im 42/84  soft-tissue]
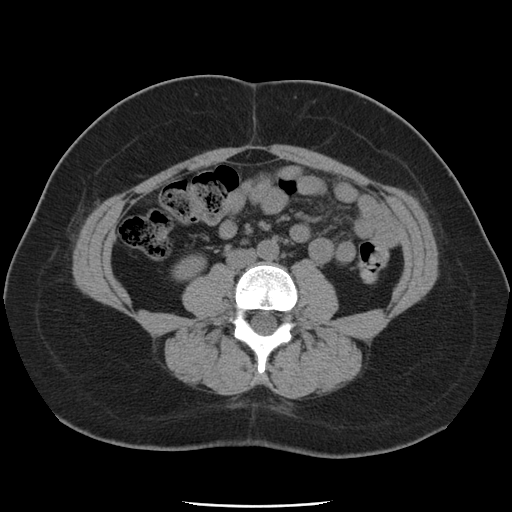
[im 42/84  lung]
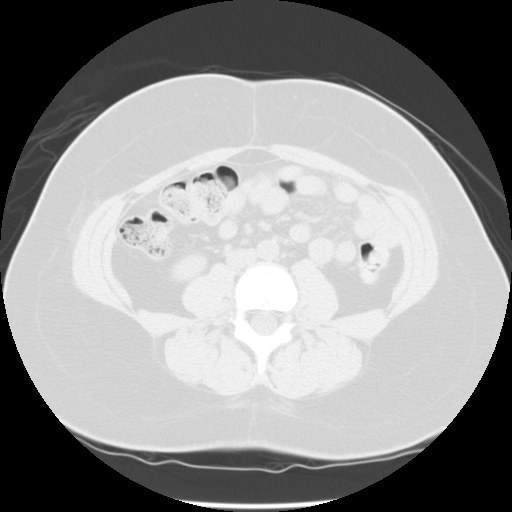
[im 52/84  soft-tissue]
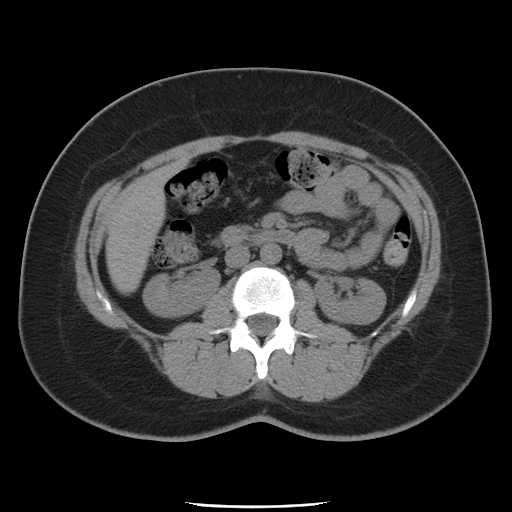
[im 52/84  lung]
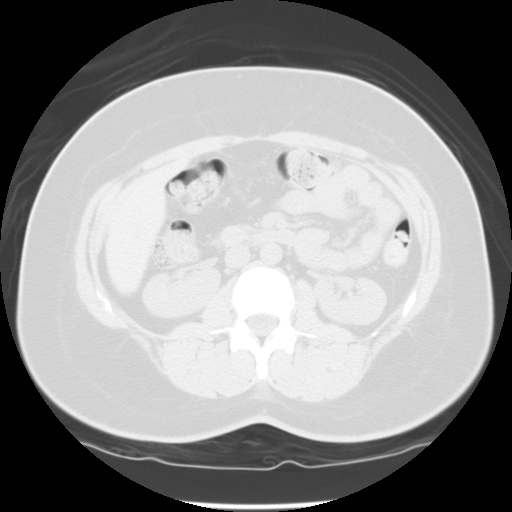
[im 63/84  soft-tissue]
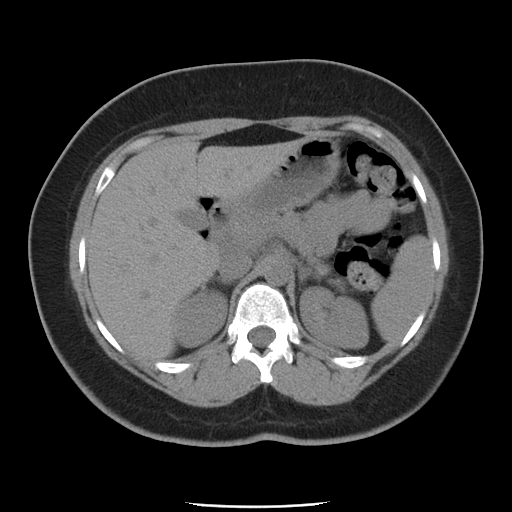
[im 63/84  lung]
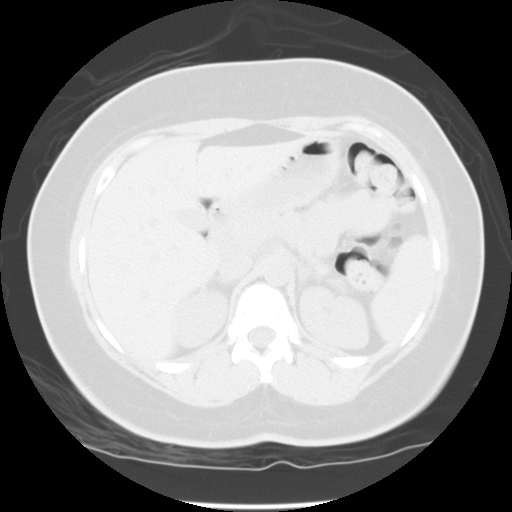
[im 73/84  soft-tissue]
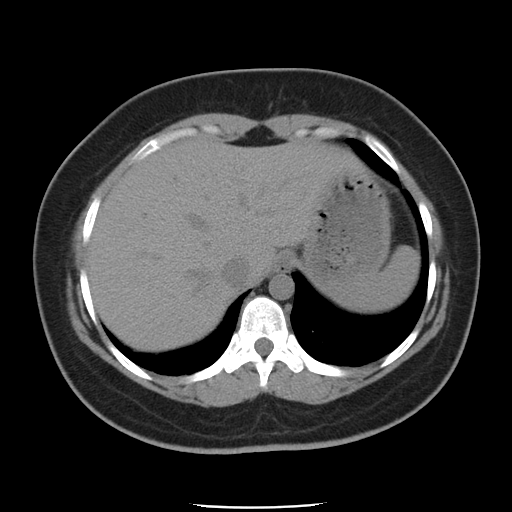
[im 73/84  lung]
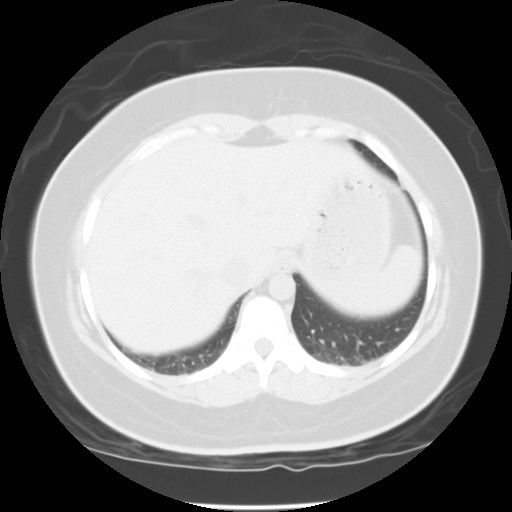

[Series 401: cor a/p · coronal · 0.86mm/px · 6 of 67 slices shown]
[im 10/67  soft-tissue]
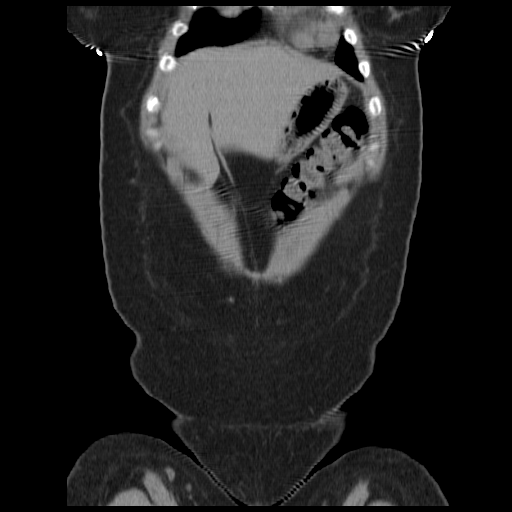
[im 19/67  soft-tissue]
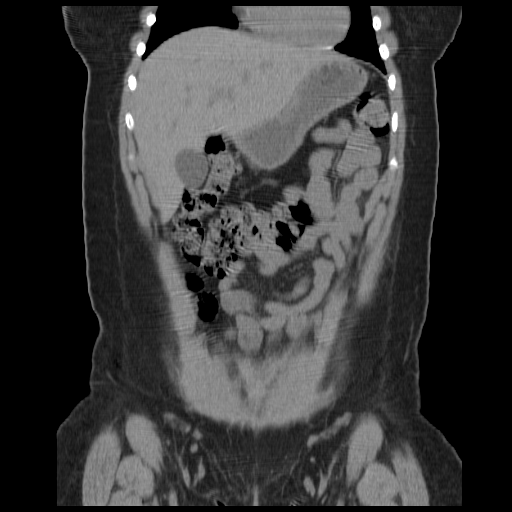
[im 29/67  soft-tissue]
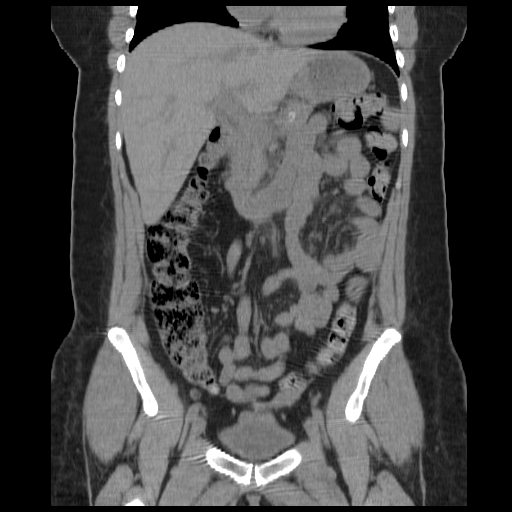
[im 38/67  soft-tissue]
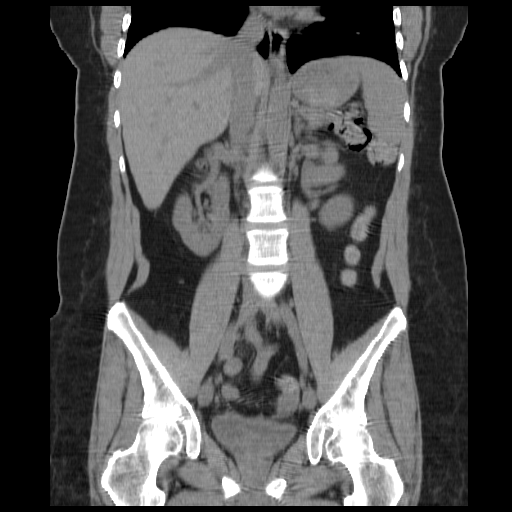
[im 48/67  soft-tissue]
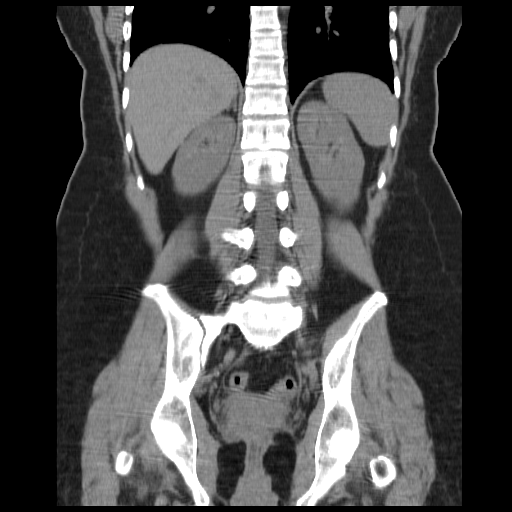
[im 57/67  soft-tissue]
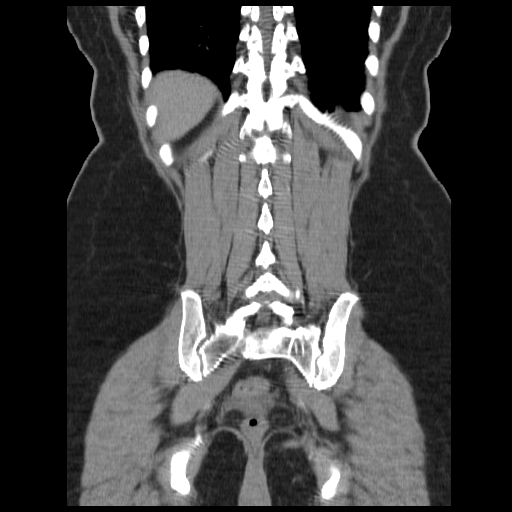

[13 of 32 positions shown; findings below may reference images not displayed]

FINDINGS: The lung bases are clear.  There are no renal calculi.  No hydronephrosis, hydroureter or perinephric stranding.  Liver, spleen, pancreas and adrenals appear normal in the unenhanced state.  No calcified gallstones.  No bile duct obstruction.  
 No adenopathy or ascites.  
 There is some calcification noted in the splenic artery. I do not see any aortoiliac calcification or other vascular calcifications.
IMPRESSION: No acute or significant findings.  
 PELVIS CT WITHOUT CONTRAST:
FINDINGS: No ureteral calculi or dilatation.  The bladder is unremarkable given the limitations of scanning without contrast.  The appendix is normal.  
 No obvious inflammatory bowel disease.   The lumbar spine is unremarkable.  There are mild degenerative changes of the SI joints.  No acute findings.
IMPRESSION: 1. No evidence for urinary tract stones or obstruction.
 2. Appendix normal.
 3. No acute or specific findings.

## 2008-03-15 IMAGING — US US ABDOMEN COMPLETE
1 series · 14 of 25 positions shown · non-contrast
Comparison: none

CLINICAL DATA: Back pain, nausea, vomiting

ABDOMEN ULTRASOUND:
TECHNIQUE: Complete abdominal ultrasound examination was performed including
evaluation of the liver, gallbladder, bile ducts, pancreas, kidneys, spleen,
IVC, and abdominal aorta.

[Series 1: unknown · 0.33mm/px · 14 of 92 slices shown]
[im 1/92]
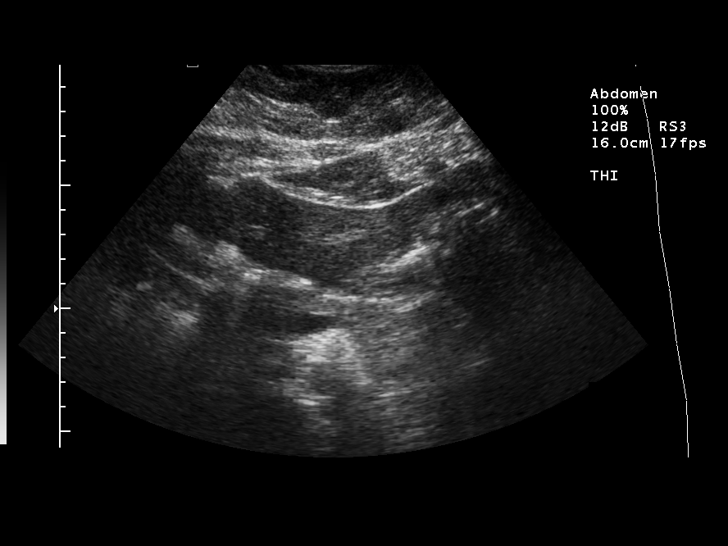
[im 8/92]
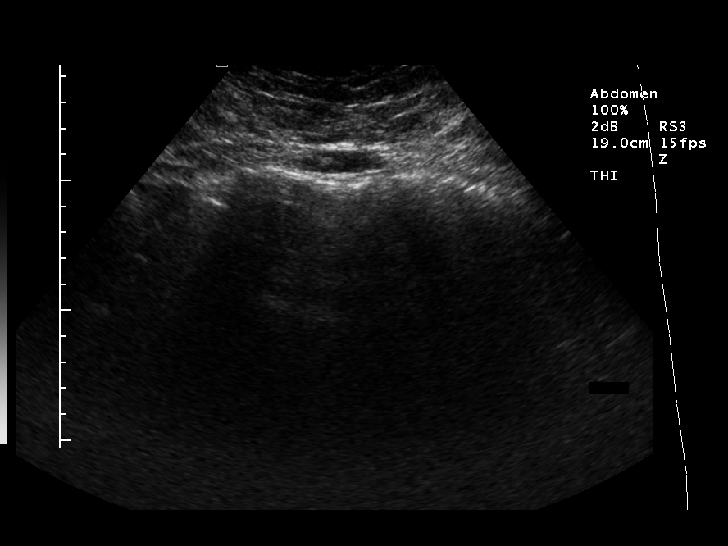
[im 16/92]
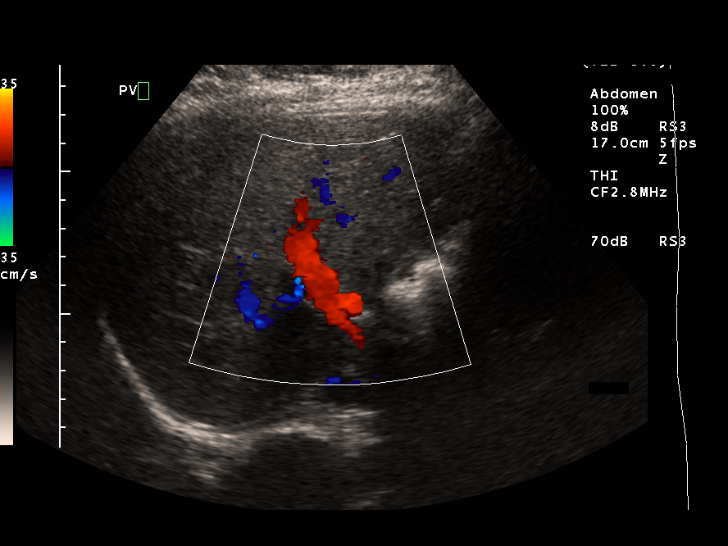
[im 23/92]
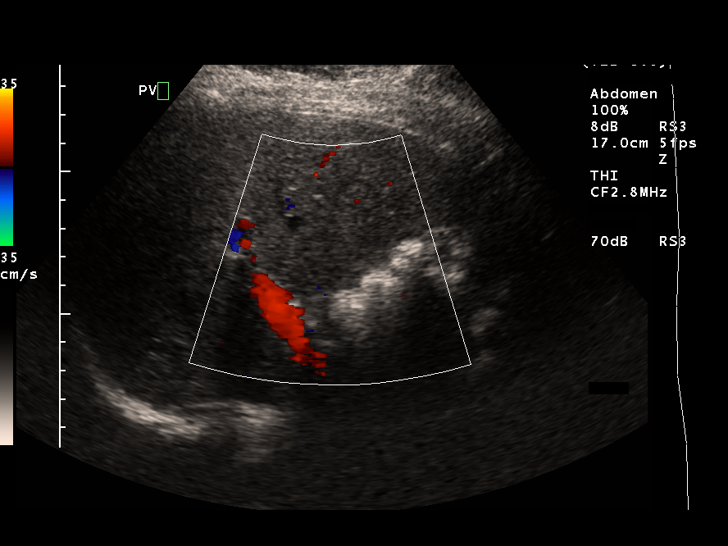
[im 31/92]
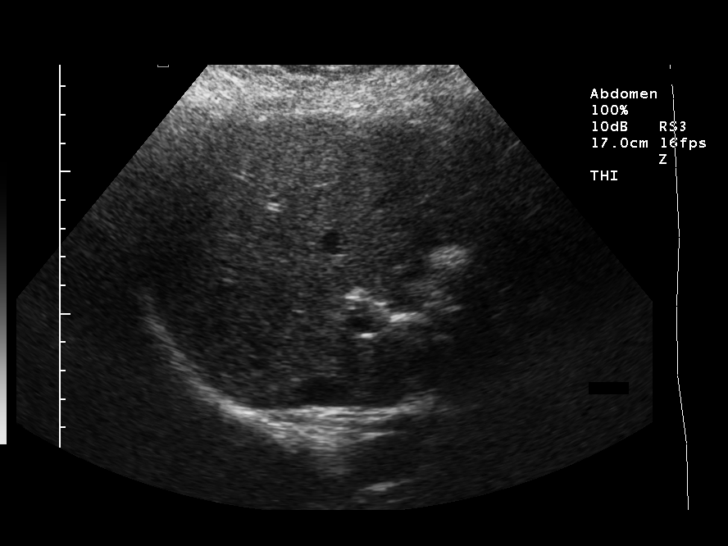
[im 35/92]
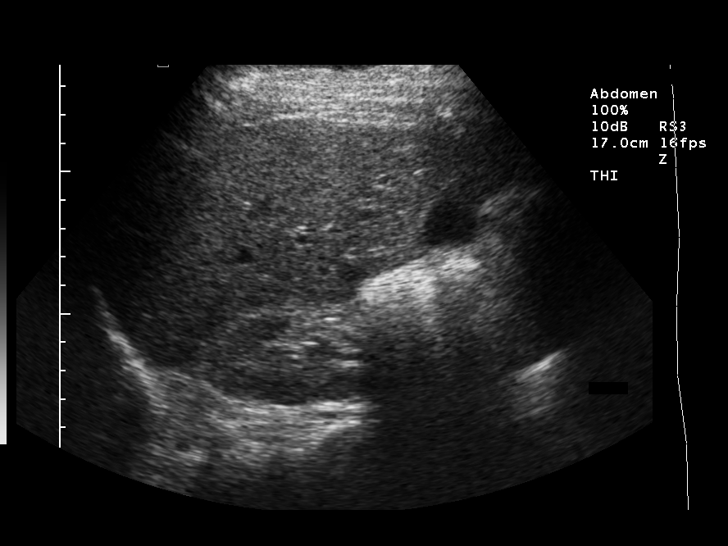
[im 42/92]
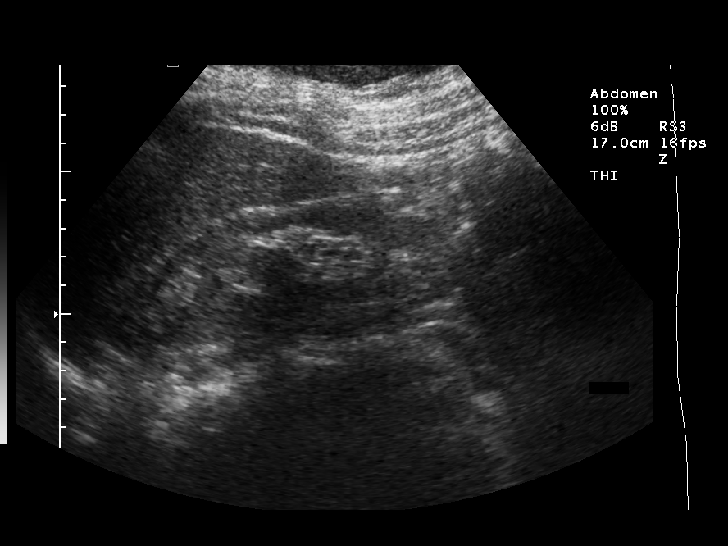
[im 50/92]
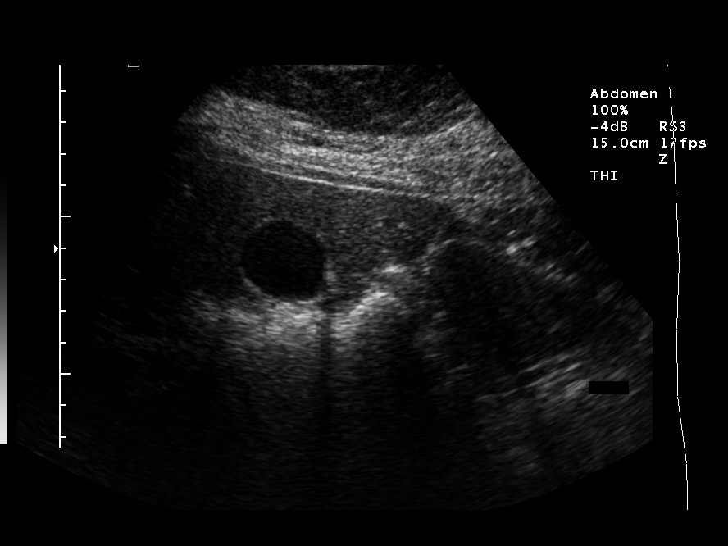
[im 57/92]
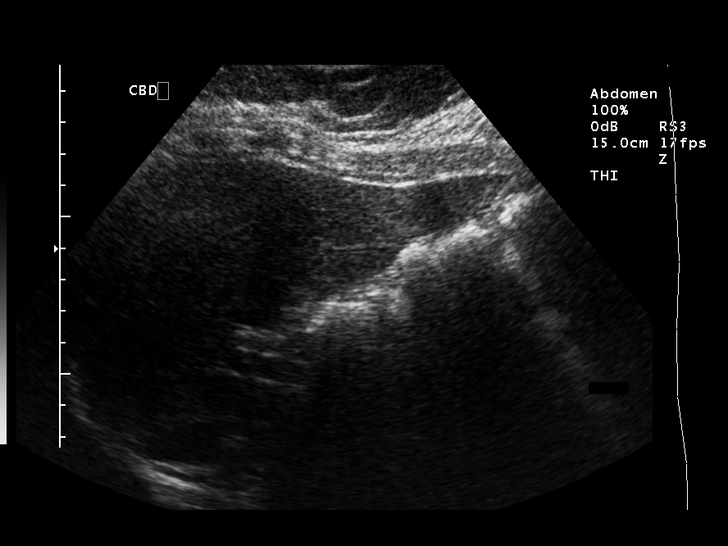
[im 61/92]
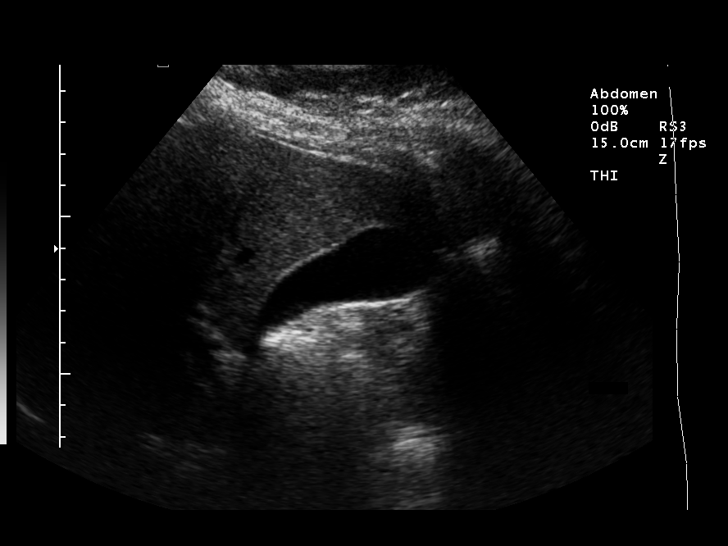
[im 69/92]
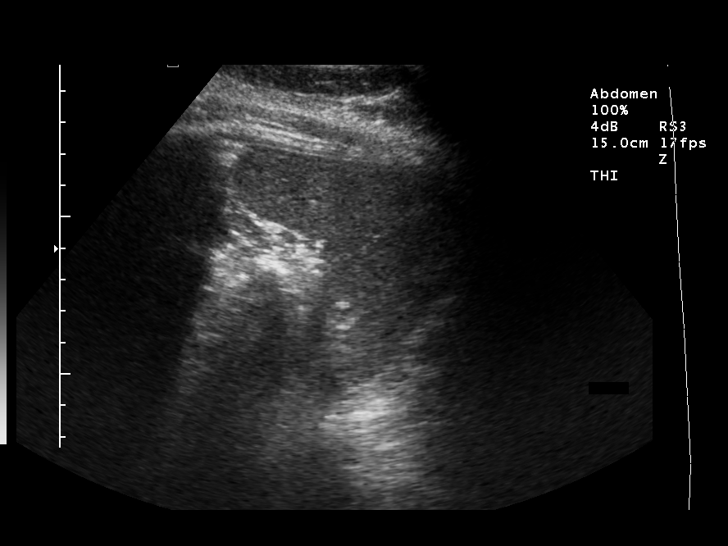
[im 76/92]
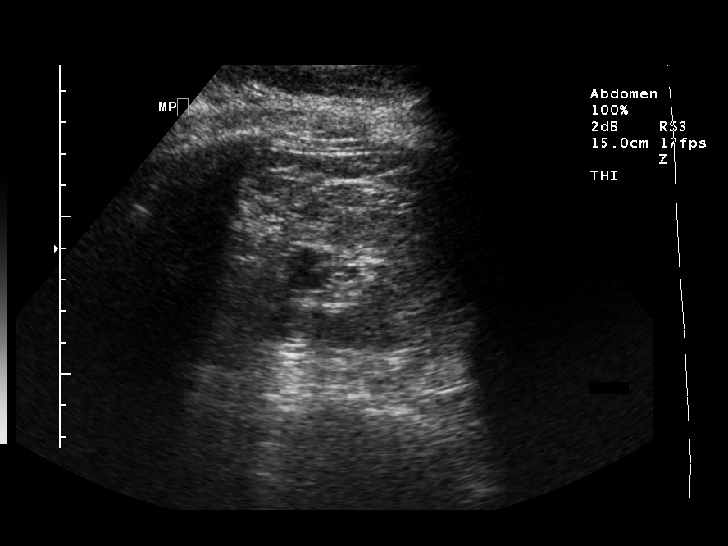
[im 84/92]
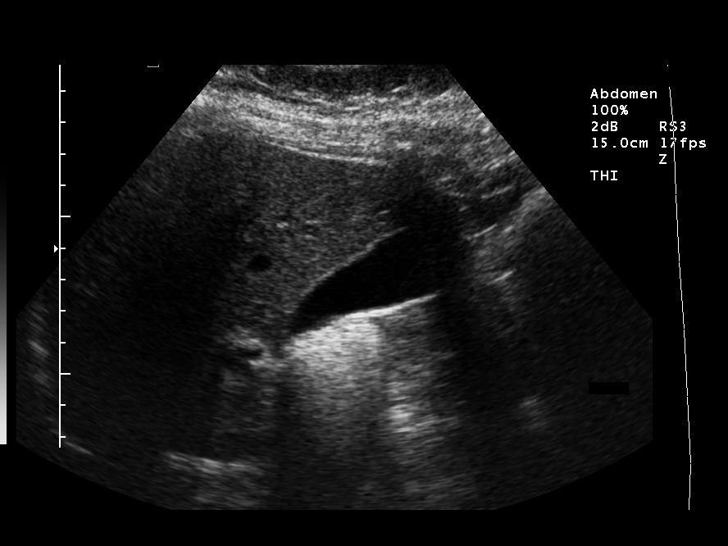
[im 92/92]
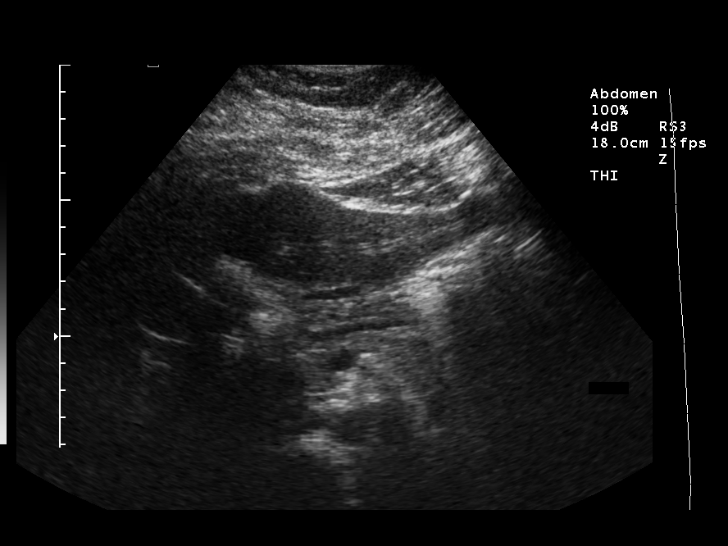

[14 of 25 positions shown; findings below may reference images not displayed]

FINDINGS: There is no evidence of gallstones or biliary ductal dilatation.  The
liver is within normal limits in echogenicity, and no focal liver lesions are
seen.  The visualized portions of the IVC and pancreas are unremarkable.

There is no evidence of splenomegaly.  The kidneys are unremarkable, and there
is no evidence of hydronephrosis.  The abdominal aorta is non-dilated.
IMPRESSION: Negative abdominal ultrasound.

## 2008-05-10 ENCOUNTER — Ambulatory Visit: Payer: Self-pay | Admitting: Hematology & Oncology

## 2008-05-11 ENCOUNTER — Encounter: Admission: RE | Admit: 2008-05-11 | Discharge: 2008-05-11 | Payer: Self-pay | Admitting: Family Medicine

## 2008-05-16 LAB — CBC WITH DIFFERENTIAL (CANCER CENTER ONLY)
BASO#: 0.1 10*3/uL (ref 0.0–0.2)
BASO%: 0.8 % (ref 0.0–2.0)
EOS%: 2.5 % (ref 0.0–7.0)
Eosinophils Absolute: 0.3 10*3/uL (ref 0.0–0.5)
HCT: 42.4 % (ref 34.8–46.6)
HGB: 14.2 g/dL (ref 11.6–15.9)
LYMPH#: 2.6 10*3/uL (ref 0.9–3.3)
LYMPH%: 22.4 % (ref 14.0–48.0)
MCH: 32.4 pg (ref 26.0–34.0)
MCHC: 33.4 g/dL (ref 32.0–36.0)
MCV: 97 fL (ref 81–101)
MONO#: 0.6 10*3/uL (ref 0.1–0.9)
MONO%: 5.2 % (ref 0.0–13.0)
NEUT#: 8 10*3/uL — ABNORMAL HIGH (ref 1.5–6.5)
NEUT%: 69.1 % (ref 39.6–80.0)
Platelets: 503 10*3/uL — ABNORMAL HIGH (ref 145–400)
RBC: 4.37 10*6/uL (ref 3.70–5.32)
RDW: 11.5 % (ref 10.5–14.6)
WBC: 11.6 10*3/uL — ABNORMAL HIGH (ref 3.9–10.0)

## 2008-05-16 LAB — TSH: TSH: 2.01 u[IU]/mL (ref 0.350–4.500)

## 2008-05-16 LAB — LACTATE DEHYDROGENASE: LDH: 229 U/L (ref 94–250)

## 2008-05-16 LAB — LUTEINIZING HORMONE: LH: 20.3 m[IU]/mL

## 2008-05-16 LAB — SEDIMENTATION RATE: Sed Rate: 48 mm/hr — ABNORMAL HIGH (ref 0–22)

## 2008-05-16 LAB — CHCC SATELLITE - SMEAR

## 2008-05-16 LAB — FOLLICLE STIMULATING HORMONE: FSH: 8.9 m[IU]/mL

## 2008-05-29 ENCOUNTER — Ambulatory Visit (HOSPITAL_COMMUNITY): Admission: RE | Admit: 2008-05-29 | Discharge: 2008-05-29 | Payer: Self-pay | Admitting: Hematology & Oncology

## 2008-05-29 ENCOUNTER — Encounter: Payer: Self-pay | Admitting: Hematology & Oncology

## 2008-06-30 IMAGING — CR DG CERVICAL SPINE 2 OR 3 VIEWS
3 series · 3 of 3 positions shown · non-contrast
Comparison: none

CLINICAL DATA: 35-year-old with neck and shoulder pain. Patient has been having neck pain for two weeks and extends to both shoulders. No known injury. 
 CERVICAL SPINE 2-3 VIEW:

[view not recorded (1 of 3)]
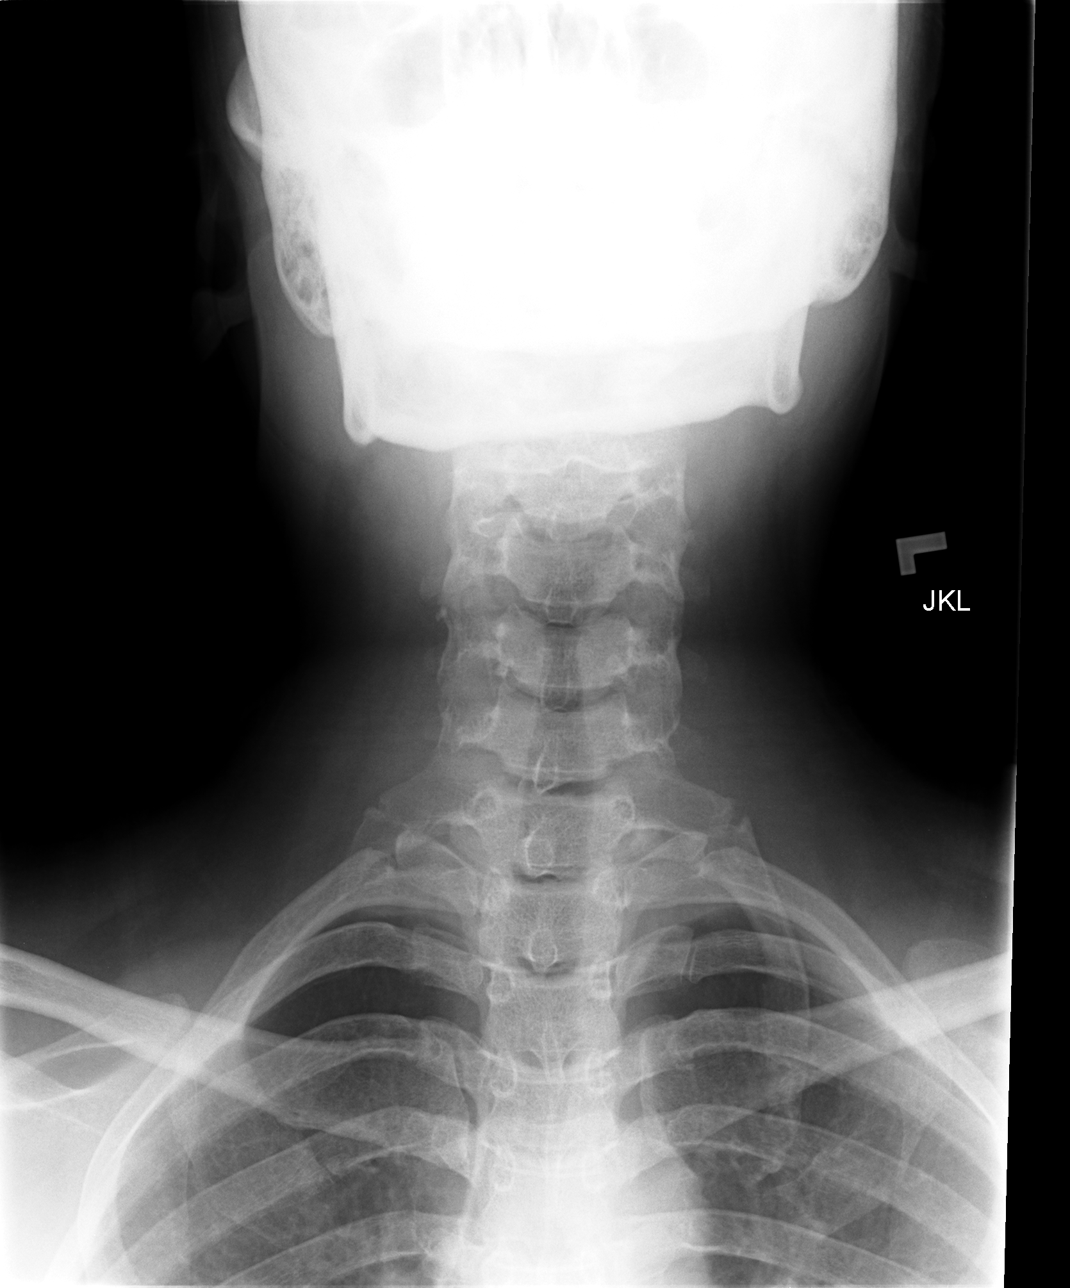

[view not recorded (2 of 3)]
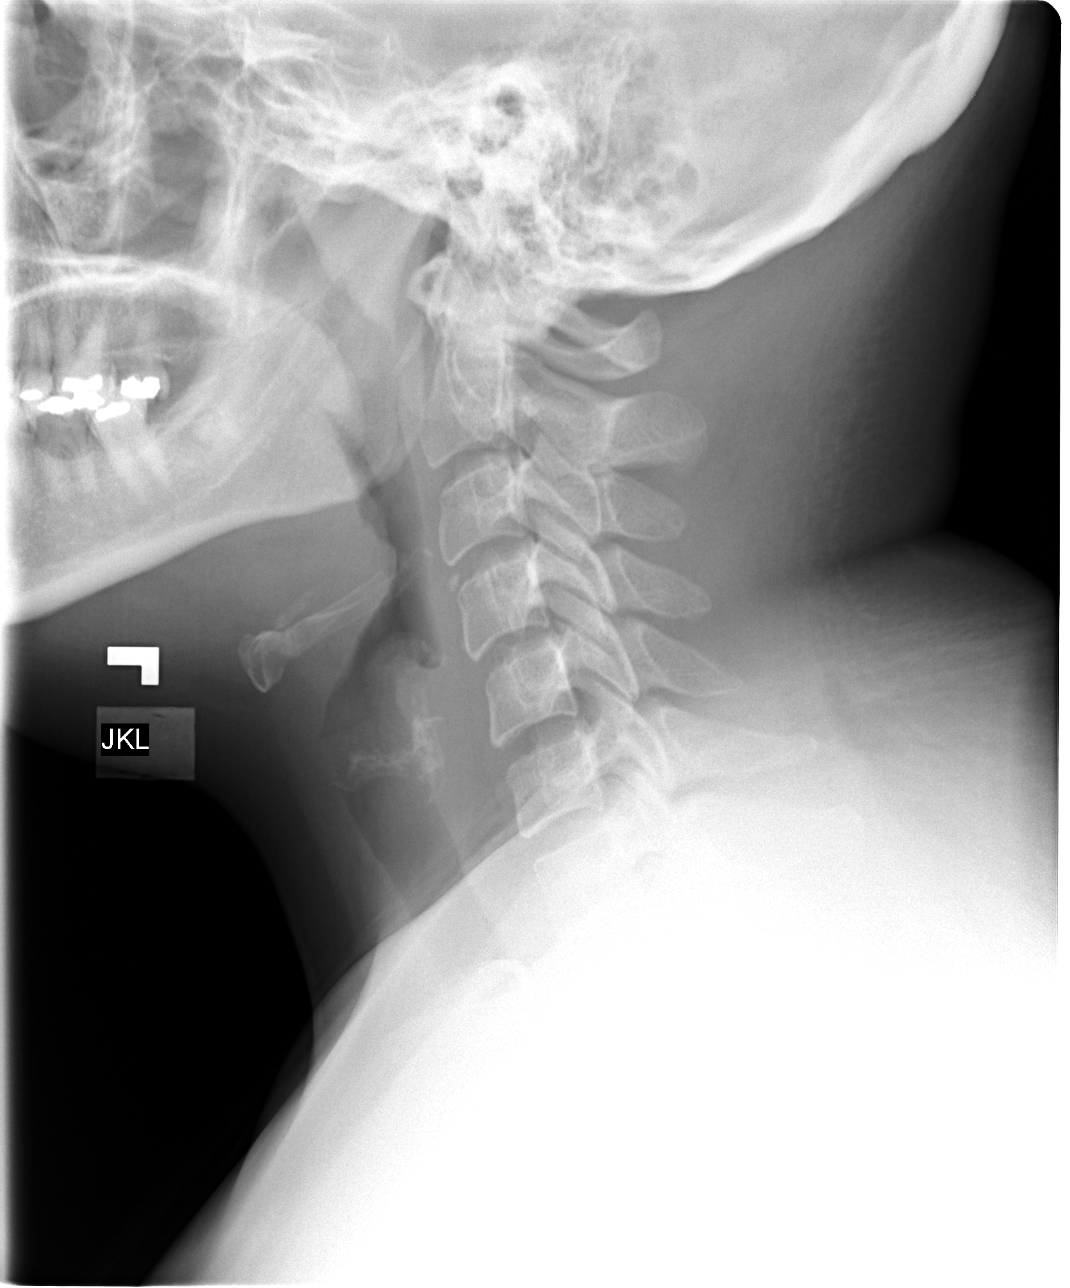

[view not recorded (3 of 3)]
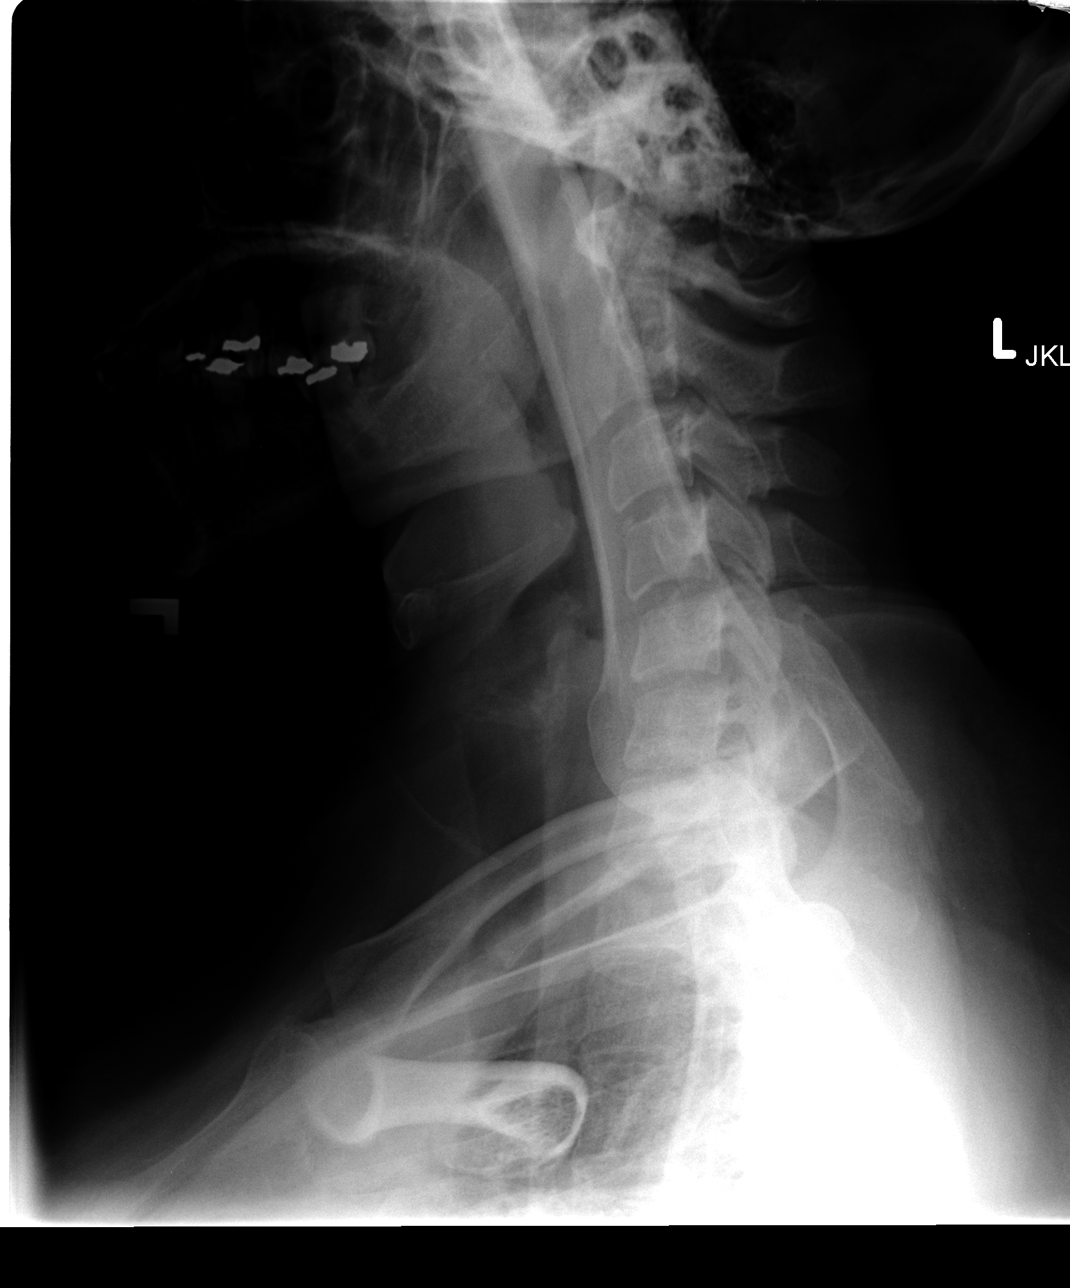

[3 of 3 positions shown; findings below may reference images not displayed]

FINDINGS: There is loss of cervical lordosis, which may be secondary to splinting, soft tissue injury or positioning. Note is made of bilateral cervical ribs at C7. No obvious fracture or dislocation identified. Lung apices are clear.
IMPRESSION: 1.  No evidence for acute abnormality.
 2.  Loss of lordosis.

## 2008-07-02 ENCOUNTER — Encounter: Admission: RE | Admit: 2008-07-02 | Discharge: 2008-07-02 | Payer: Self-pay | Admitting: Family Medicine

## 2008-07-03 ENCOUNTER — Encounter: Admission: RE | Admit: 2008-07-03 | Discharge: 2008-07-03 | Payer: Self-pay | Admitting: Family Medicine

## 2008-09-04 ENCOUNTER — Emergency Department (HOSPITAL_COMMUNITY): Admission: EM | Admit: 2008-09-04 | Discharge: 2008-09-04 | Payer: Self-pay | Admitting: Emergency Medicine

## 2008-12-05 IMAGING — CT CT PELVIS W/ CM
2 of 5 series · 17 of 46 positions shown, 19 images · IV contrast (omnipaque)
Comparison: 03/27/05.

CLINICAL DATA: Abdominal and pelvic pain.  Nausea.  Previous hysterectomy.  
 ABDOMEN CT WITH CONTRAST:
TECHNIQUE: Multidetector CT imaging of the abdomen was performed following the standard protocol during bolus administration of intravenous contrast.
 Contrast:  100 cc Omnipaque 300 IV and oral contrast.
TECHNIQUE: Multidetector CT imaging of the pelvis was performed following the standard protocol during bolus administration of intravenous contrast.

[Series 2: abd_pel 5.0 b40s · axial · 0.59mm/px · z∈[-358,+16]mm · 14 of 85 slices shown, 16 images]
[im 5/85  soft-tissue]
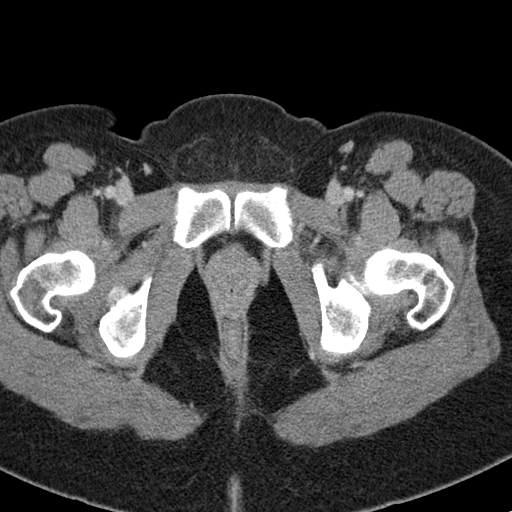
[im 5/85  bone]
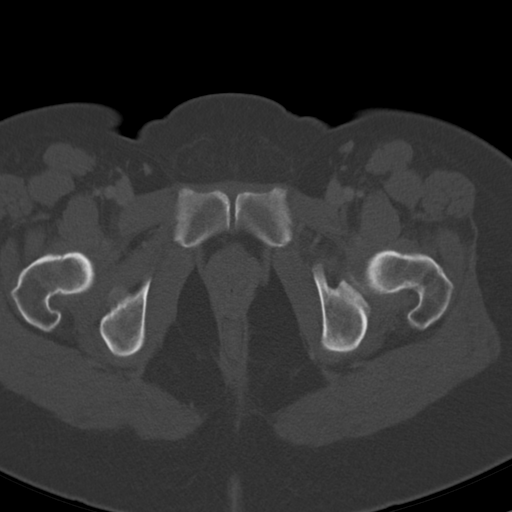
[im 10/85  soft-tissue]
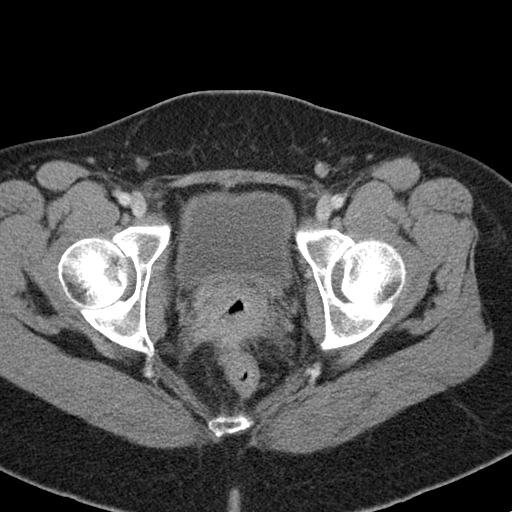
[im 19/85  soft-tissue]
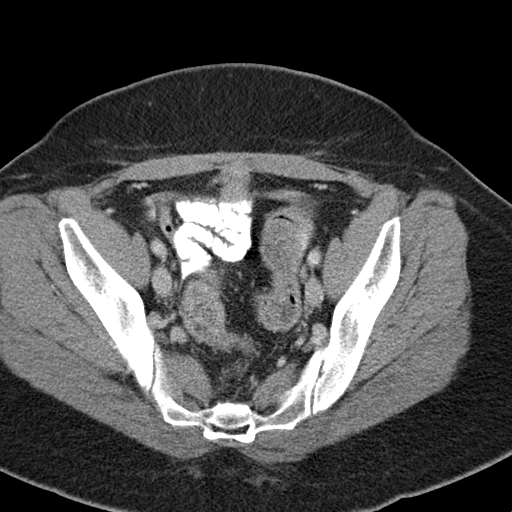
[im 24/85  soft-tissue]
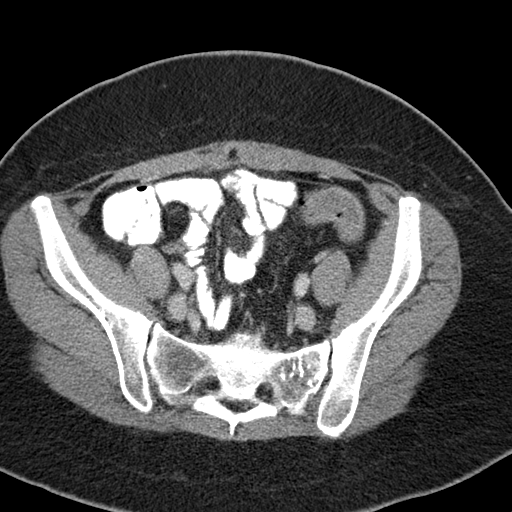
[im 29/85  soft-tissue]
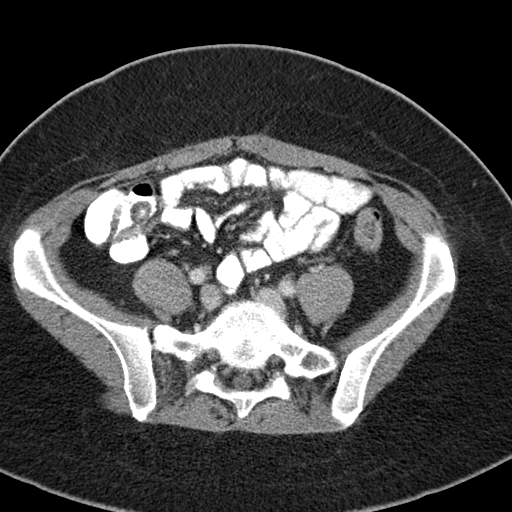
[im 33/85  soft-tissue]
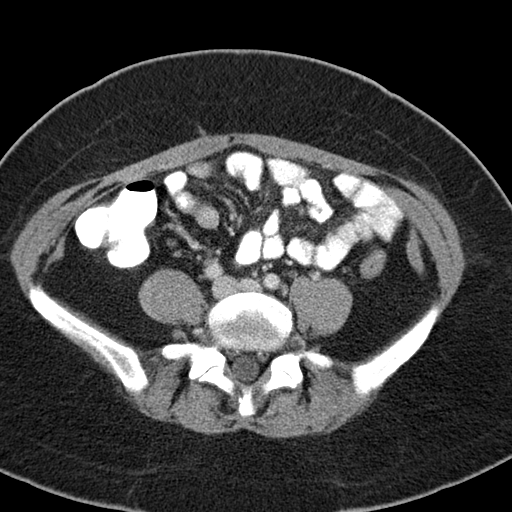
[im 38/85  soft-tissue]
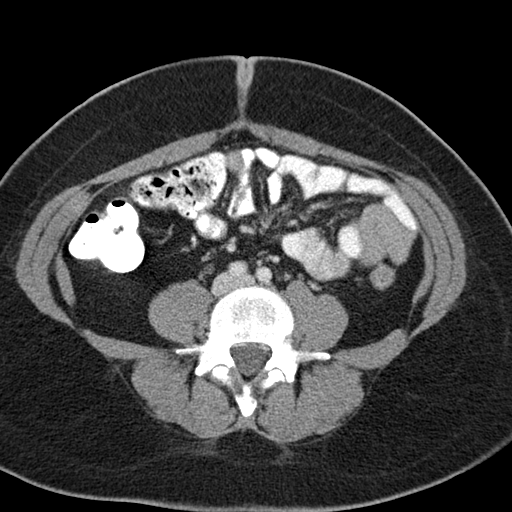
[im 47/85  soft-tissue]
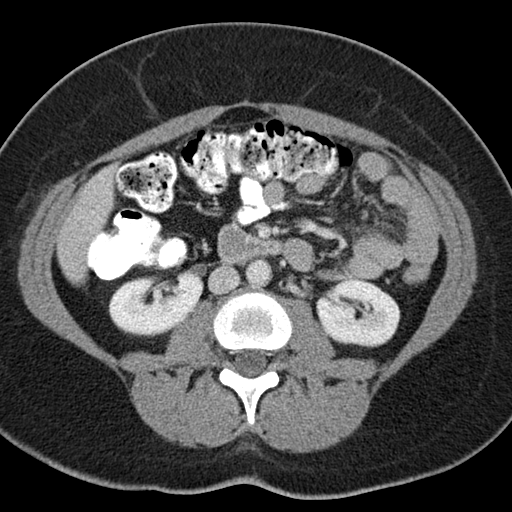
[im 52/85  soft-tissue]
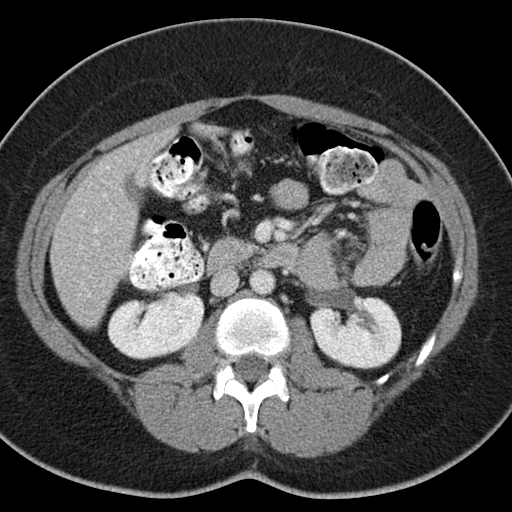
[im 52/85  bone]
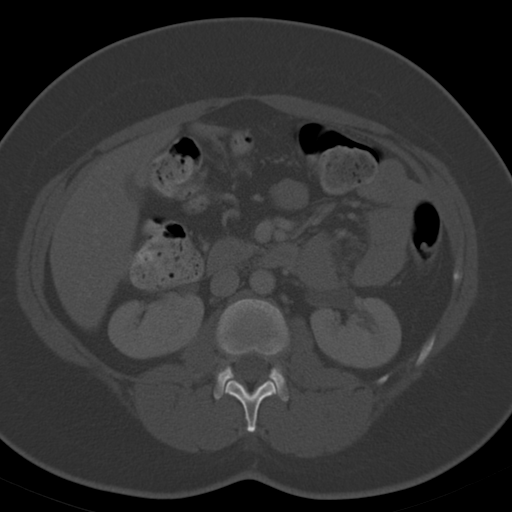
[im 57/85  soft-tissue]
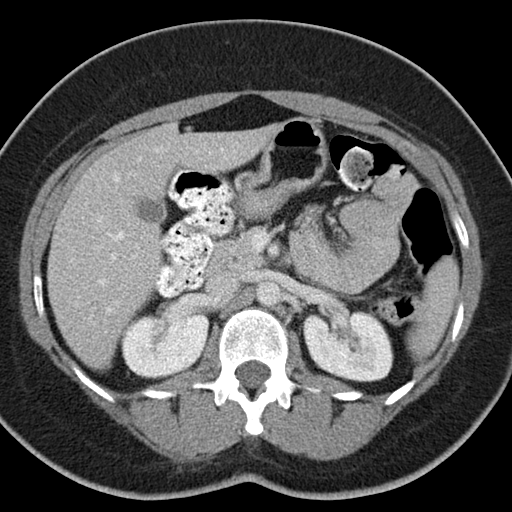
[im 61/85  soft-tissue]
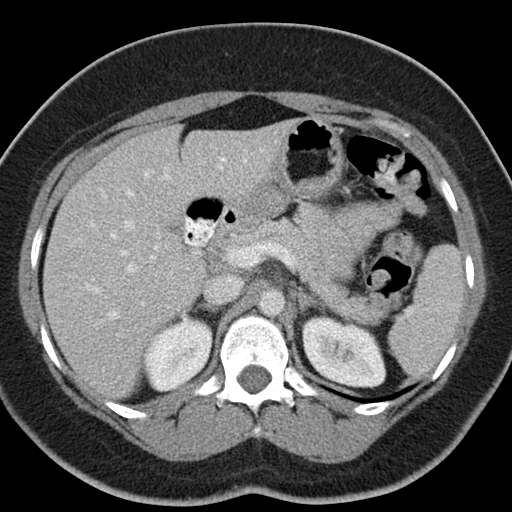
[im 66/85  soft-tissue]
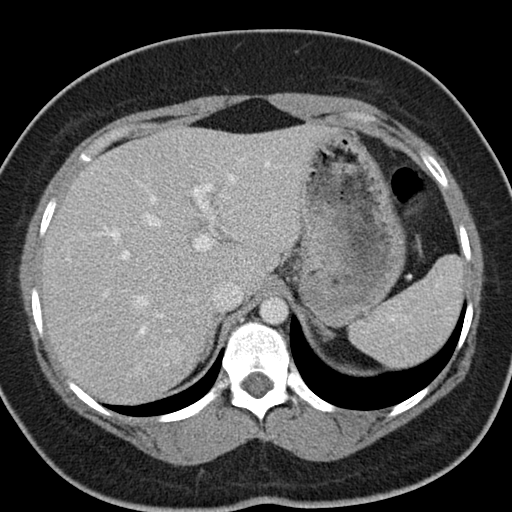
[im 75/85  soft-tissue]
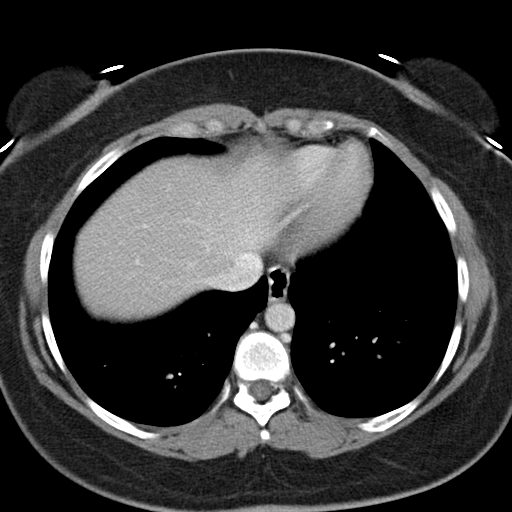
[im 80/85  soft-tissue]
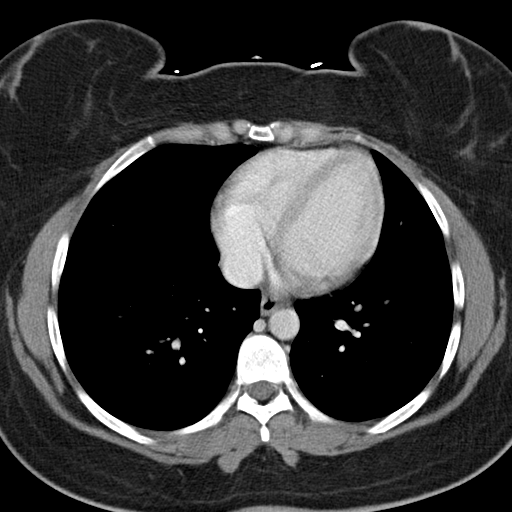

[Series 602: <mpr thick range> · coronal · 0.83mm/px · 3 of 66 slices shown]
[im 22/66  soft-tissue]
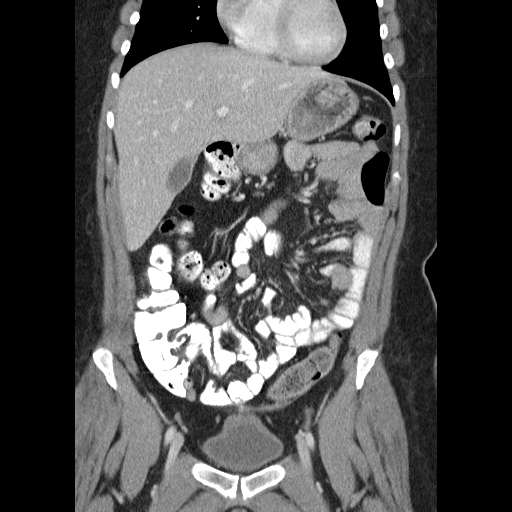
[im 29/66  soft-tissue]
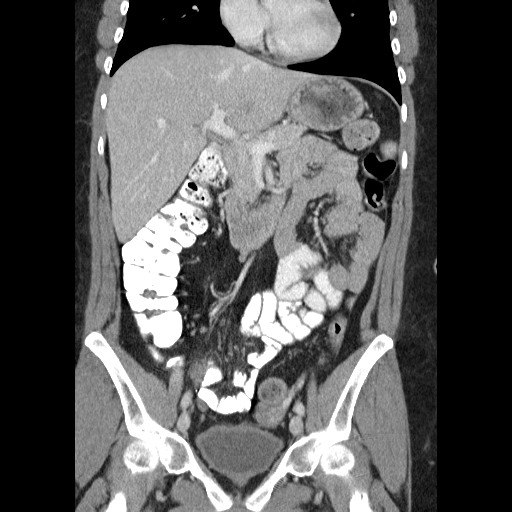
[im 37/66  soft-tissue]
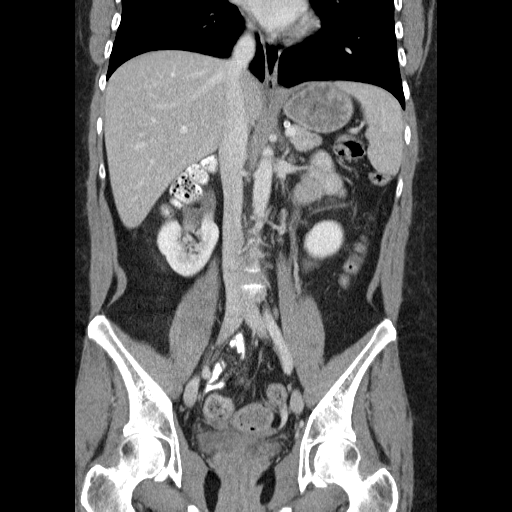

[17 of 46 positions shown; findings below may reference images not displayed]

FINDINGS: The abdominal parenchymal organs are normal in appearance.  The gallbladder is unremarkable.  There is no evidence of mass or adenopathy.  There is no evidence of inflammatory process or abnormal fluid collections.  Abdominal bowel loops are unremarkable.
IMPRESSION: Negative abdomen CT. 
 PELVIS CT WITH CONTRAST:
FINDINGS: The patient has undergone a hysterectomy since the prior study.  There is no evidence of pelvic mass or inflammatory process.  There is no evidence of bowel wall thickening or dilatation.  Appendix is normal.  No other significant abnormality identified within the pelvis.
IMPRESSION: No evidence of appendicitis or other acute findings.  Prior hysterectomy noted.

## 2008-12-18 ENCOUNTER — Emergency Department (HOSPITAL_COMMUNITY): Admission: EM | Admit: 2008-12-18 | Discharge: 2008-12-18 | Payer: Self-pay | Admitting: Family Medicine

## 2009-01-03 IMAGING — US US ABDOMEN COMPLETE
1 series · 14 of 25 positions shown · non-contrast
Comparison: none

CLINICAL DATA: Nausea, vomiting, right sided abdominal pain.   Diabetes.  
COMPLETE ABDOMINAL ULTRASOUND:
TECHNIQUE: Complete abdominal ultrasound examination was performed including evaluation of the liver, gallbladder, bile ducts, pancreas, kidneys, spleen, IVC, and abdominal aorta.

[Series 1: us abdomen complete · 0.29mm/px · 14 of 91 slices shown]
[im 1/91]
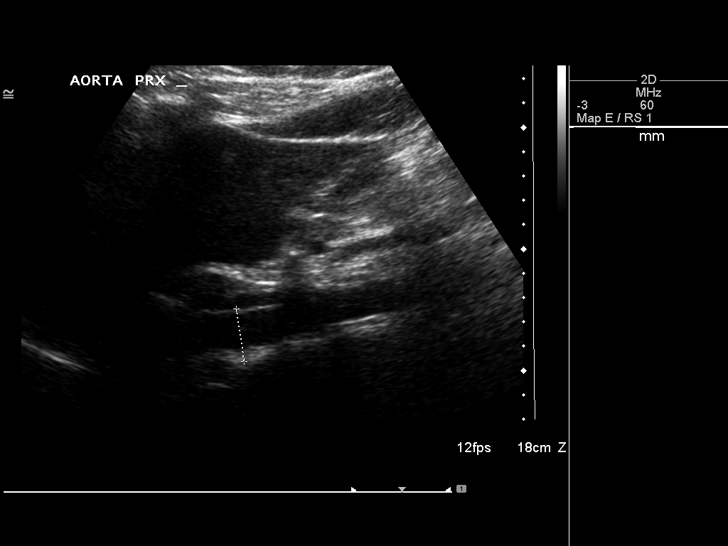
[im 8/91]
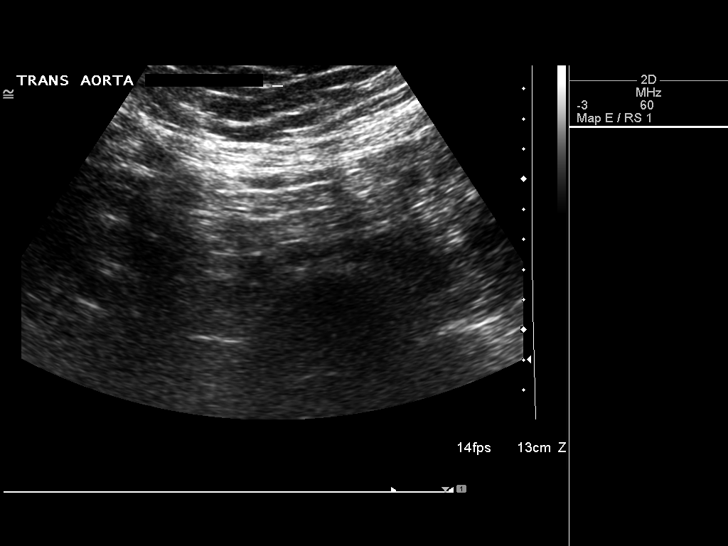
[im 16/91]
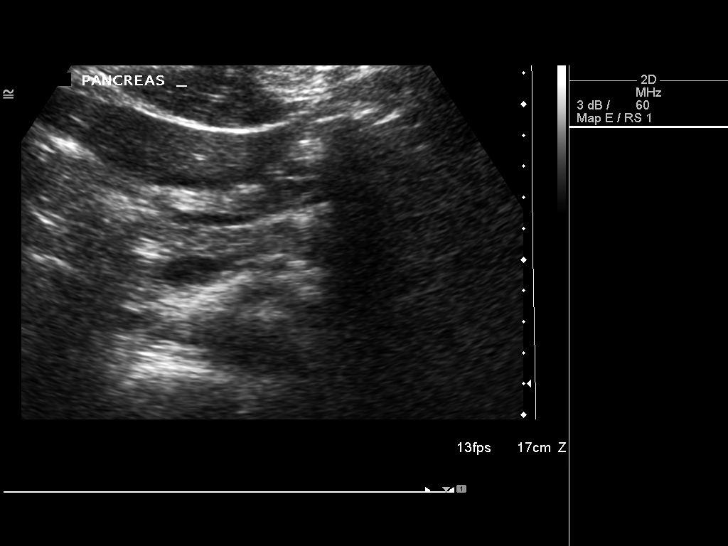
[im 23/91]
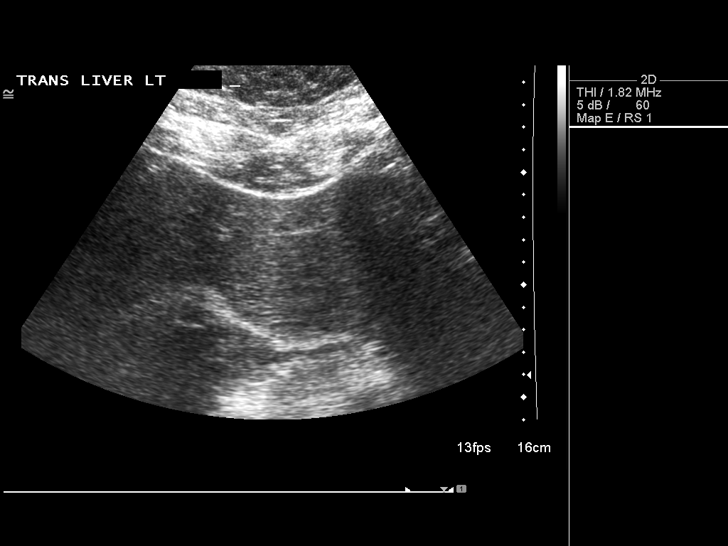
[im 31/91]
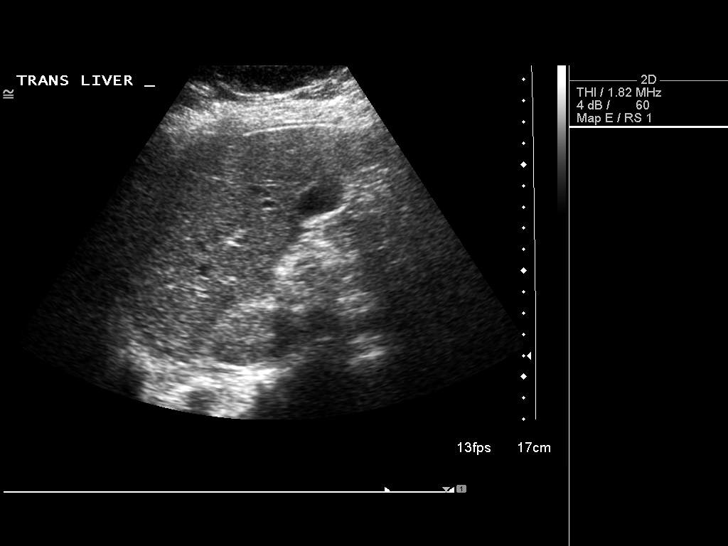
[im 34/91]
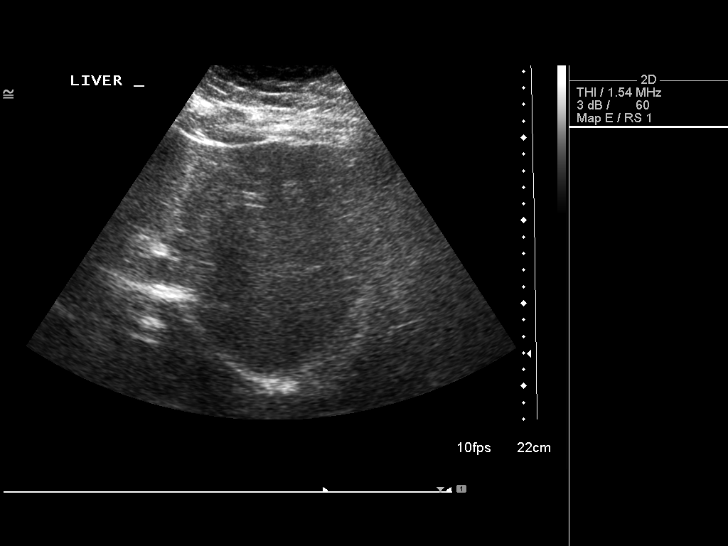
[im 42/91]
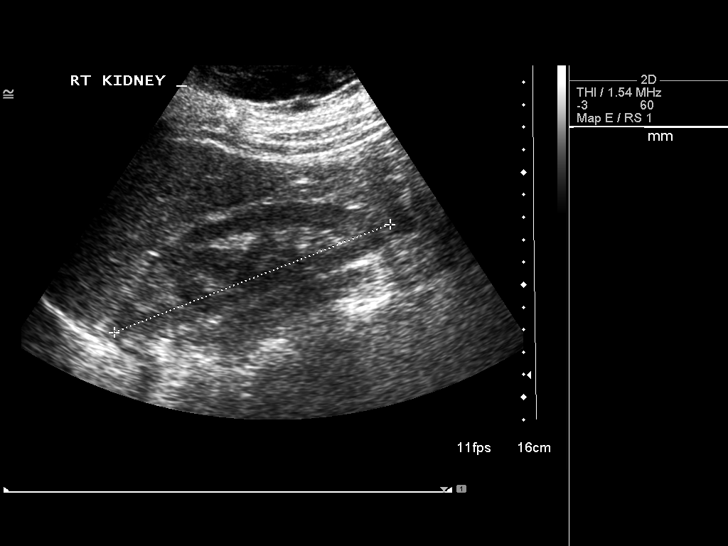
[im 49/91]
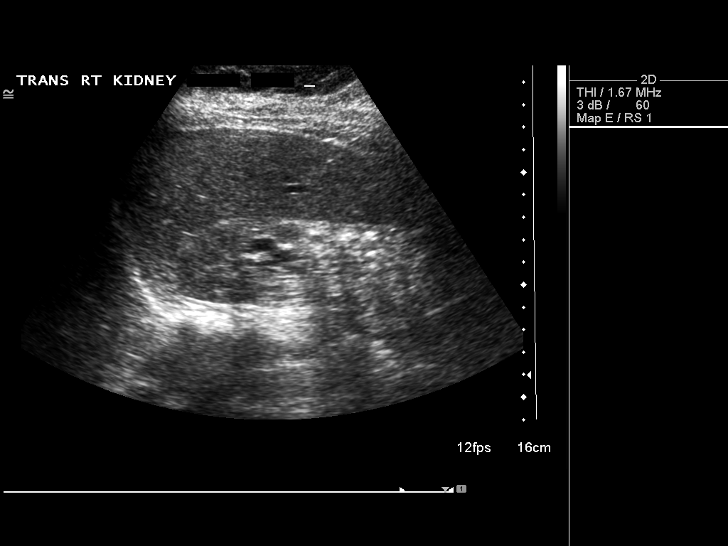
[im 57/91]
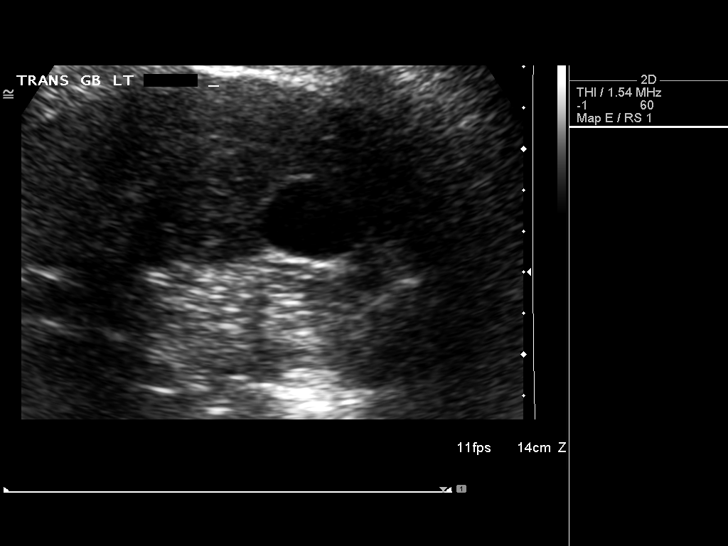
[im 61/91]
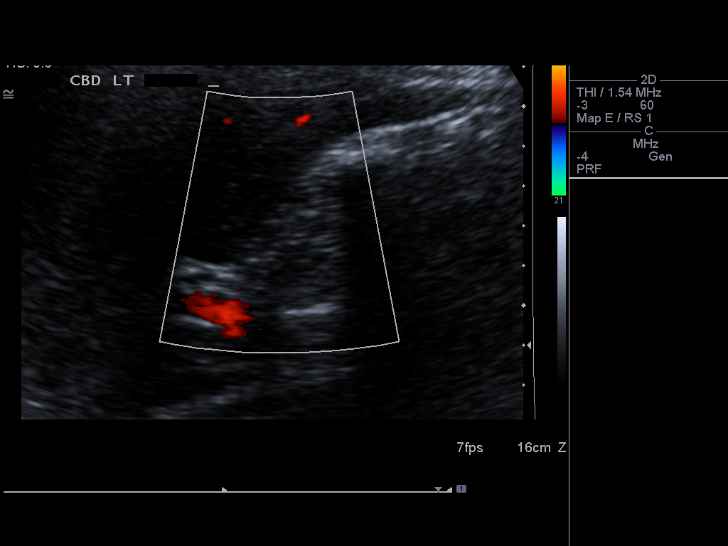
[im 68/91]
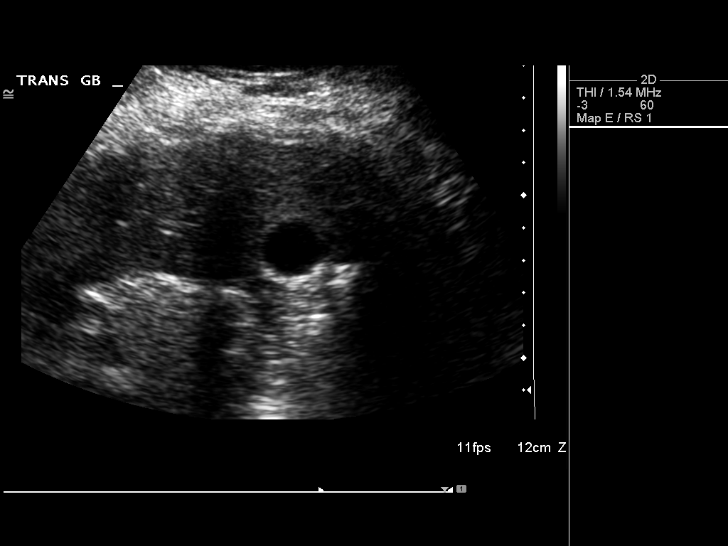
[im 76/91]
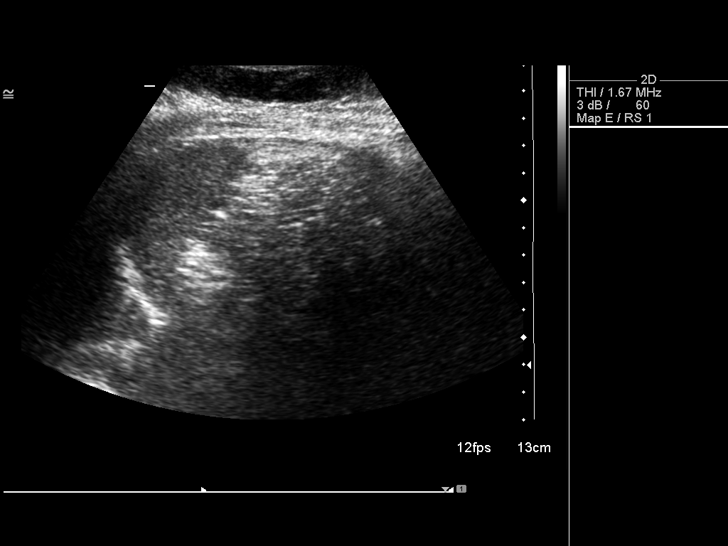
[im 83/91]
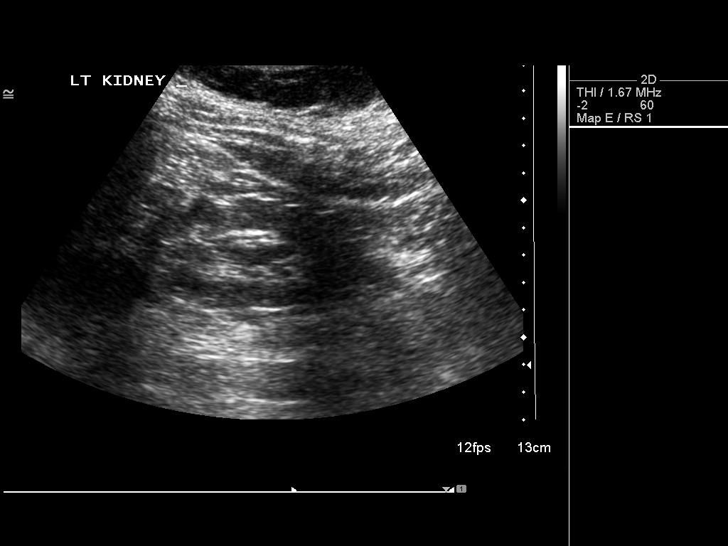
[im 91/91]
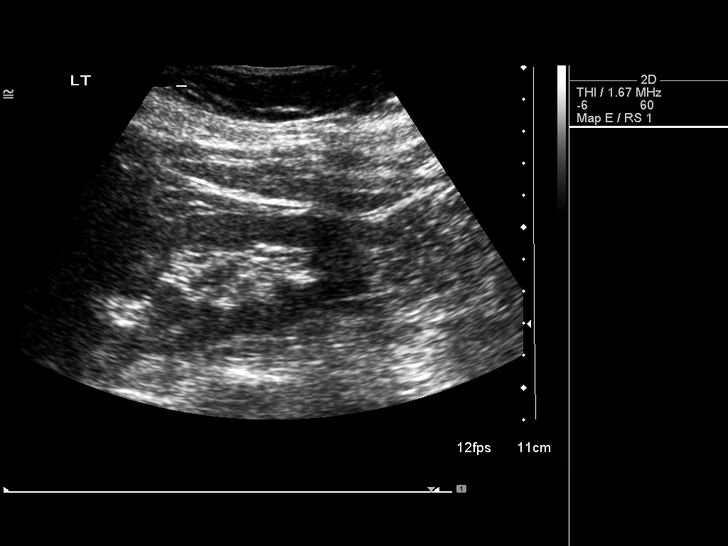

[14 of 25 positions shown; findings below may reference images not displayed]

FINDINGS: Normal gallbladder.  Gallbladder wall thickness is 1.3mm.  Common duct measures 3mm in diameter which is well within normal limits.  Mild diffuse increase in hepatic echogenicity compatible with fatty infiltration of the liver which is also appreciated on 02/02/07 CT.  That exam revealed mild fatty infiltration of the liver.  Patent IVC.  Abdominal aorta maximum diameter is 2.2cm.  Pancreatic head and body unremarkable.  Poor visualization of pancreatic tail due to adjacent bowel gas.  The spleen measures 8.6cm in length and appears unremarkable.  Unremarkable appearance of the kidneys.  The right and left kidneys measure 13.2cm and 12.5cm in length, respectively.  No evidence of abdominal mass or ascites.
IMPRESSION: Normal gallbladder.  No biliary ductal dilatation.  Mild diffuse fatty infiltration of the liver.

## 2009-01-07 IMAGING — CT CT ABDOMEN W/O CM
2 of 4 series · 17 of 46 positions shown, 19 images · non-contrast
Comparison: none

CLINICAL DATA: Diabetic.  Abdominal pain and diarrhea.
ABDOMEN CT WITHOUT CONTRAST ? 03/07/07:
TECHNIQUE: Multidetector CT imaging of the abdomen was performed following the standard protocol without IV contrast. 
Comparison is made to the 02/02/07 CT.
TECHNIQUE: Multidetector CT imaging of the pelvis was performed following the standard protocol without IV contrast.

[Series 2: <(id) w/ a/p <(id) · axial · 0.70mm/px · z∈[-465,-105]mm · 14 of 80 slices shown, 16 images]
[im 4/80  soft-tissue]
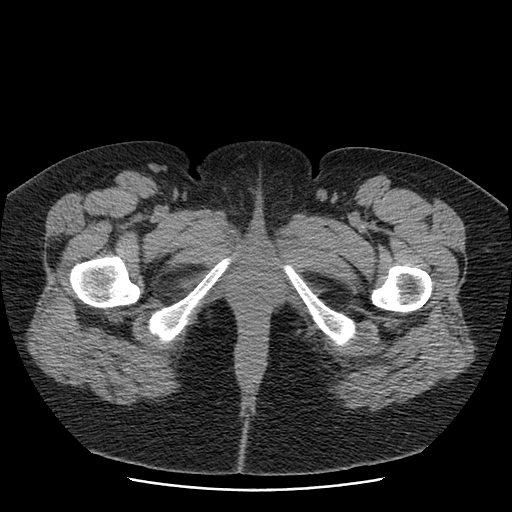
[im 4/80  bone]
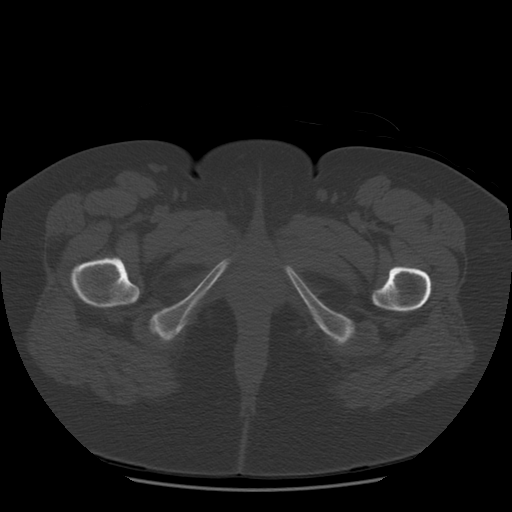
[im 10/80  soft-tissue]
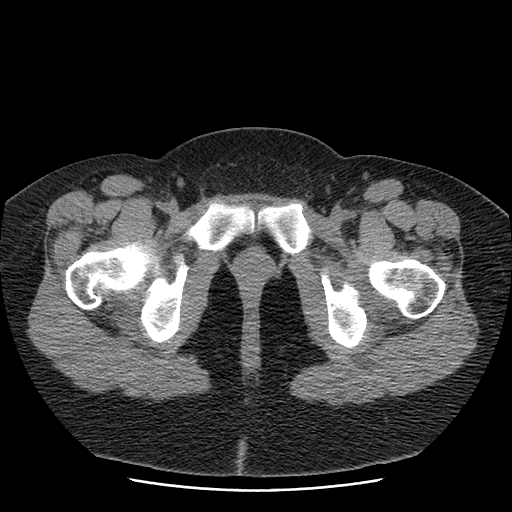
[im 16/80  soft-tissue]
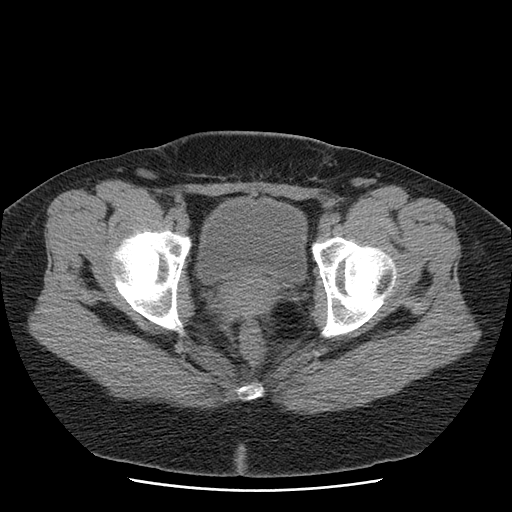
[im 22/80  soft-tissue]
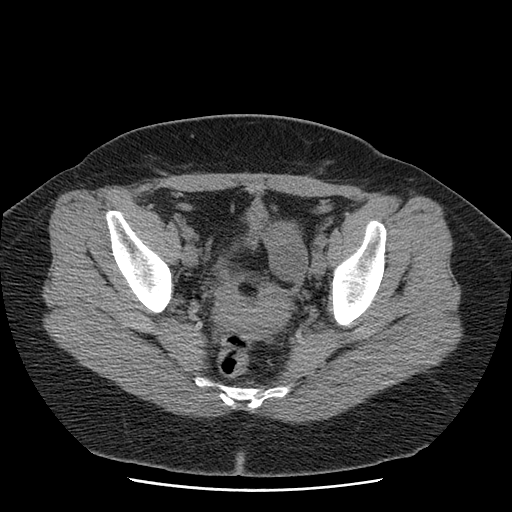
[im 28/80  soft-tissue]
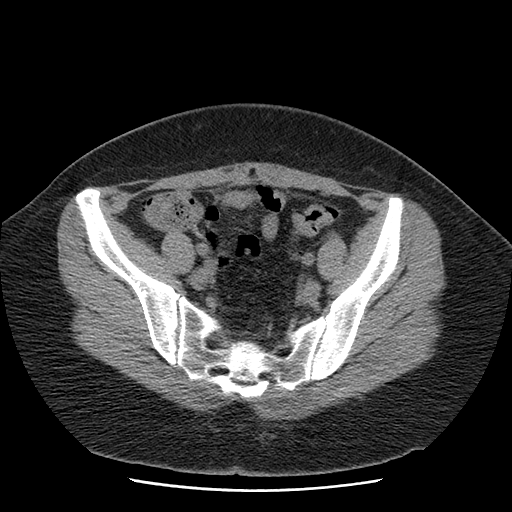
[im 31/80  soft-tissue]
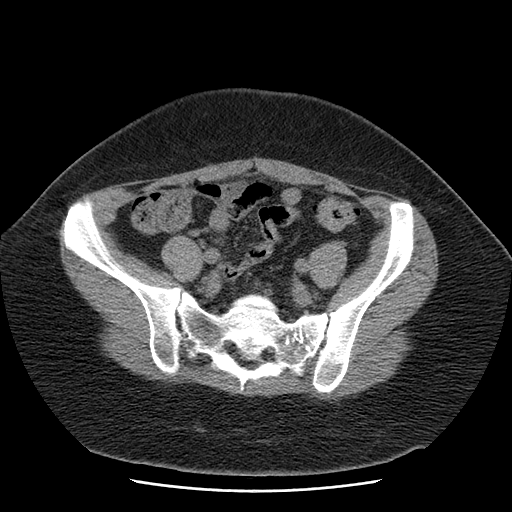
[im 37/80  soft-tissue]
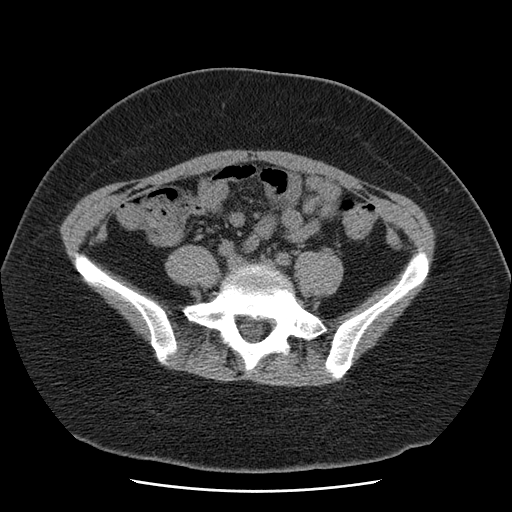
[im 43/80  soft-tissue]
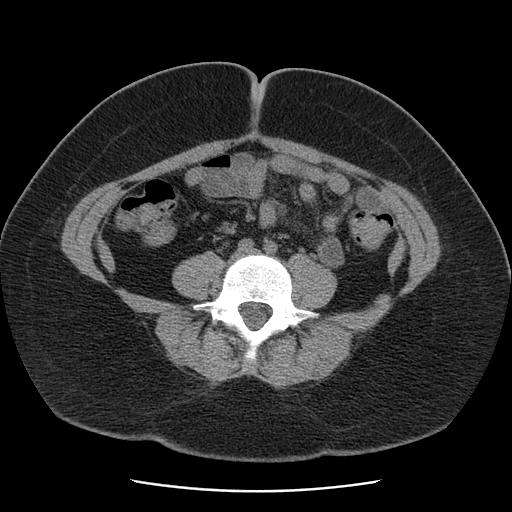
[im 49/80  soft-tissue]
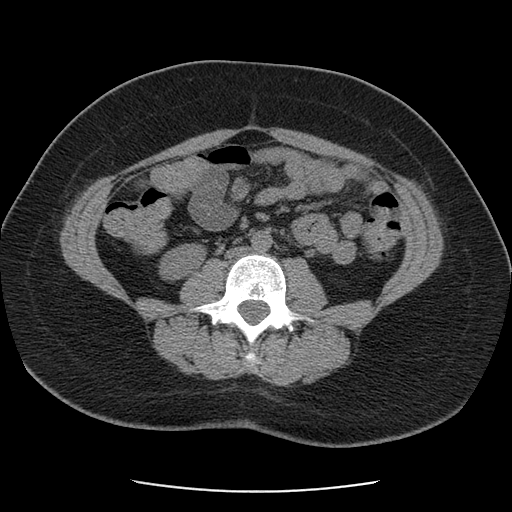
[im 49/80  bone]
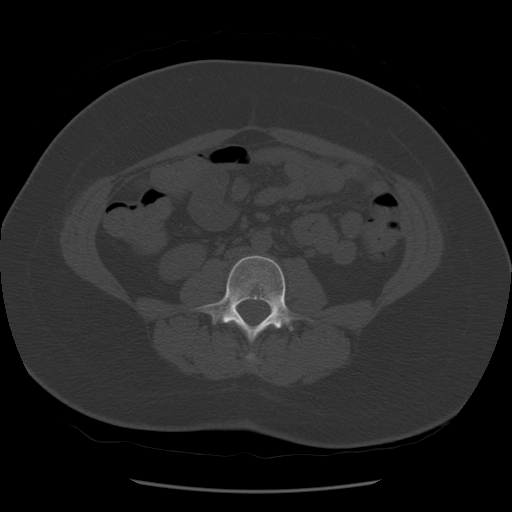
[im 52/80  soft-tissue]
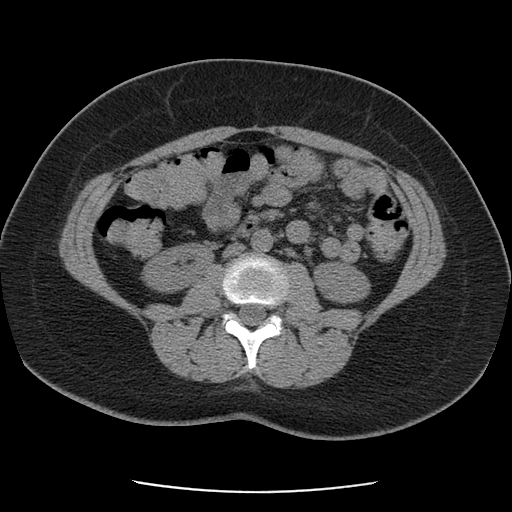
[im 58/80  soft-tissue]
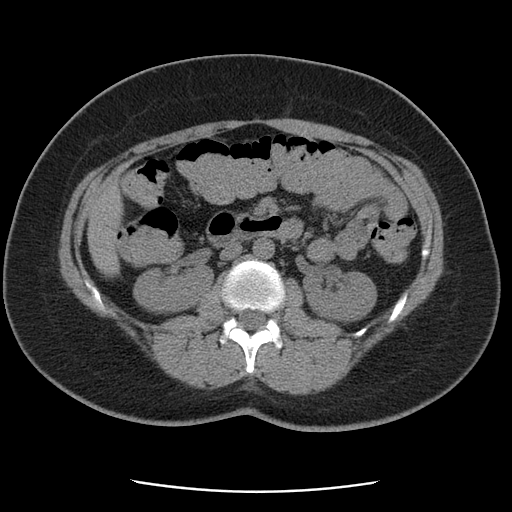
[im 64/80  soft-tissue]
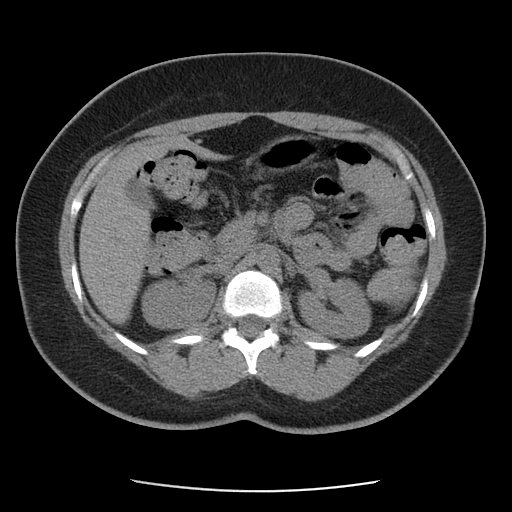
[im 70/80  soft-tissue]
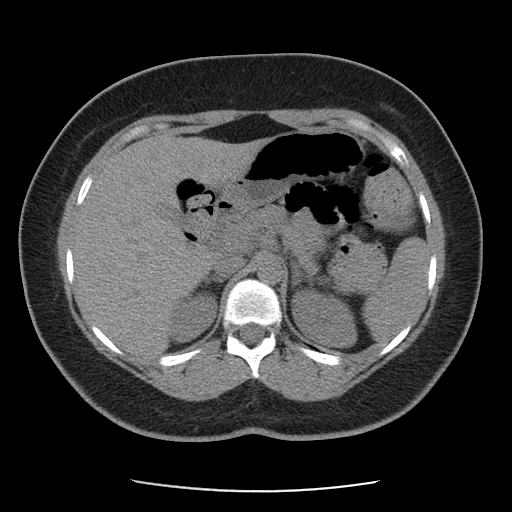
[im 76/80  soft-tissue]
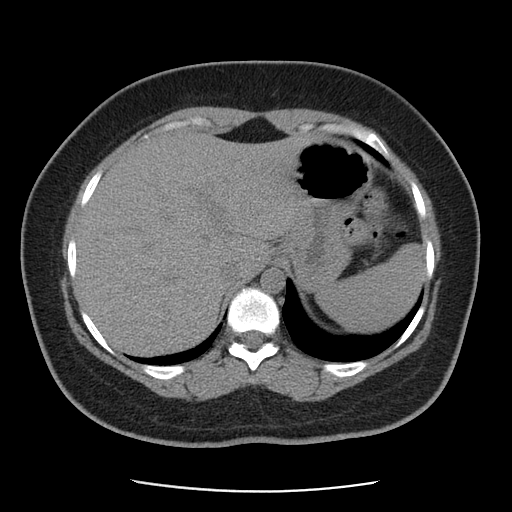

[Series 401: reformatted · coronal · 0.78mm/px · 3 of 158 slices shown]
[im 53/158  soft-tissue]
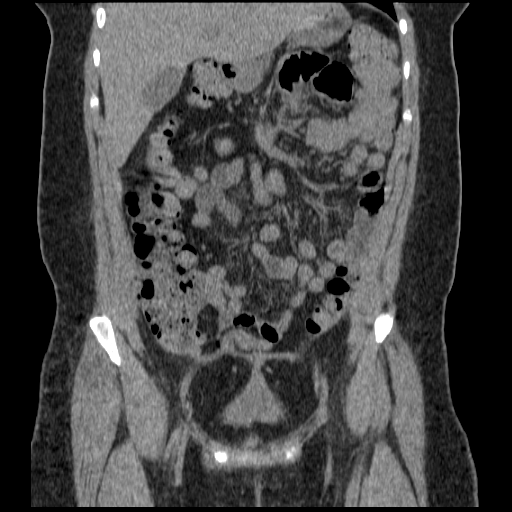
[im 70/158  soft-tissue]
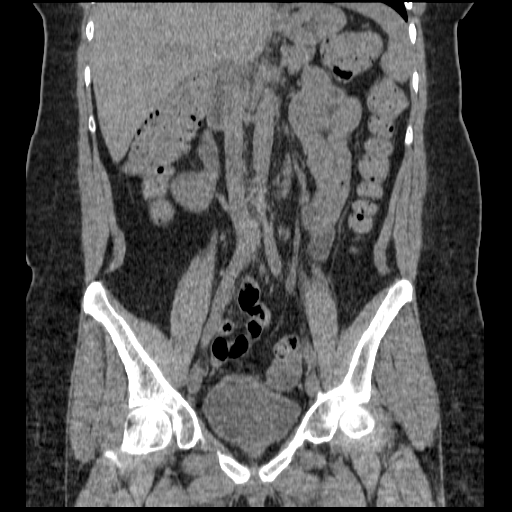
[im 88/158  soft-tissue]
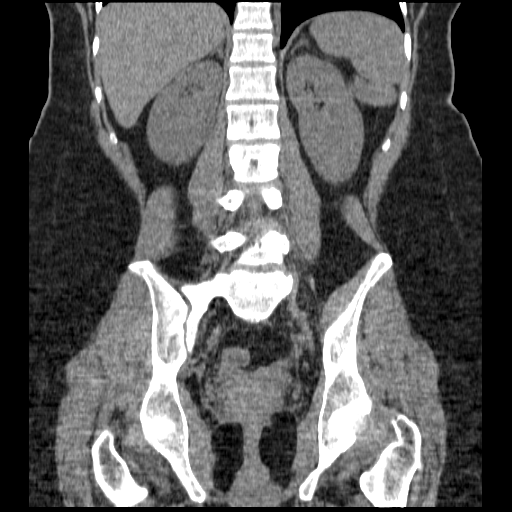

[17 of 46 positions shown; findings below may reference images not displayed]

FINDINGS: No acute renal or perirenal abnormality.  No hydroureteronephrosis.  No acute abdominal findings.  There is faint calcification of the splenic artery.
IMPRESSION: Negative abdominal CT for acute disease.
PELVIS CT WITHOUT CONTRAST ? 03/07/07:
FINDINGS: No free pelvic fluid.  There is a 2.5 cm left ovarian cyst.  The uterus and bladder are unremarkable.  There are a few sigmoid colon diverticula.
IMPRESSION: No acute pelvic findings.  Left ovarian cyst.

## 2009-01-19 ENCOUNTER — Emergency Department (HOSPITAL_COMMUNITY): Admission: EM | Admit: 2009-01-19 | Discharge: 2009-01-20 | Payer: Self-pay | Admitting: Emergency Medicine

## 2009-03-14 ENCOUNTER — Emergency Department (HOSPITAL_COMMUNITY): Admission: EM | Admit: 2009-03-14 | Discharge: 2009-03-14 | Payer: Self-pay | Admitting: Family Medicine

## 2009-04-12 ENCOUNTER — Emergency Department (HOSPITAL_COMMUNITY): Admission: EM | Admit: 2009-04-12 | Discharge: 2009-04-12 | Payer: Self-pay | Admitting: Family Medicine

## 2009-05-22 ENCOUNTER — Emergency Department (HOSPITAL_COMMUNITY): Admission: EM | Admit: 2009-05-22 | Discharge: 2009-05-22 | Payer: Self-pay | Admitting: Emergency Medicine

## 2009-06-07 ENCOUNTER — Emergency Department (HOSPITAL_COMMUNITY): Admission: EM | Admit: 2009-06-07 | Discharge: 2009-06-07 | Payer: Self-pay | Admitting: Family Medicine

## 2009-06-24 ENCOUNTER — Emergency Department (HOSPITAL_COMMUNITY): Admission: EM | Admit: 2009-06-24 | Discharge: 2009-06-25 | Payer: Self-pay | Admitting: Emergency Medicine

## 2009-11-06 IMAGING — CR DG CHEST 1V PORT
1 series · 1 of 1 positions shown · non-contrast
Comparison: 12/14/2005

CLINICAL DATA: Chest pain and shortness of breath.

PORTABLE CHEST - 1 VIEW

[view not recorded]
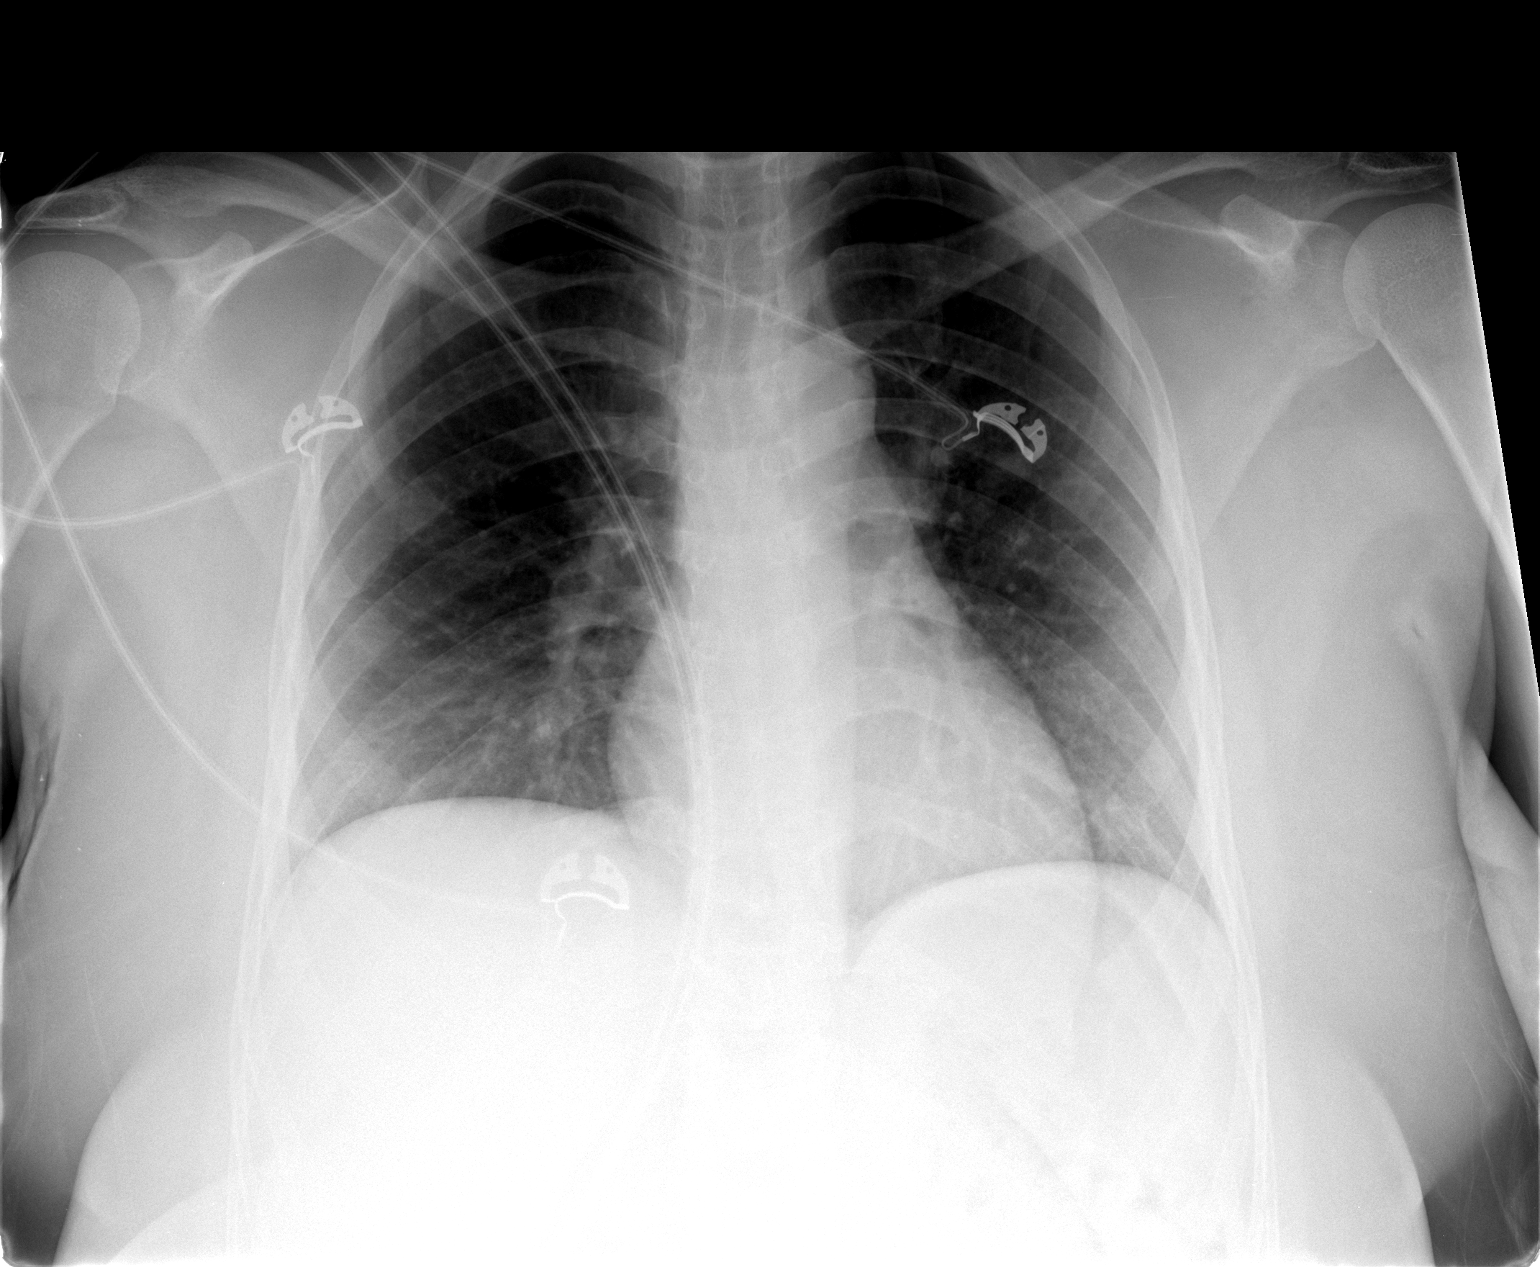

[1 of 1 positions shown; findings below may reference images not displayed]

FINDINGS: The cardiac silhouette, mediastinal and hilar contours
are within normal limits and stable. The lungs are clear. Minimal
streaky left basilar atelectasis. No pleural effusions. The bony
thorax is intact.  There is a cervical rib noted on the left.
IMPRESSION: Minimal streaky basilar atelectasis.  No infiltrates, edema or
effusions.

## 2009-11-06 IMAGING — CT CT ANGIO CHEST
2 of 7 series · 19 of 36 positions shown · IV contrast (APPLIED)
Comparison: 05/13/2006

CLINICAL DATA: Chest pain/short of breath

CT ANGIOGRAPHY CHEST
TECHNIQUE: Multidetector CT imaging of the chest using the
standard protocol during bolus administration of intravenous
contrast. Multiplanar reconstructed images including MIPs were
obtained and reviewed to evaluate the vascular anatomy.
Contrast: 80 ml [id number]-[id number]

[Series 10: pulm embolism 1.0 b25f thins · axial · 0.63mm/px · z∈[+696,+942]mm · 18 of 277 slices shown]
[im 15/277  lung]
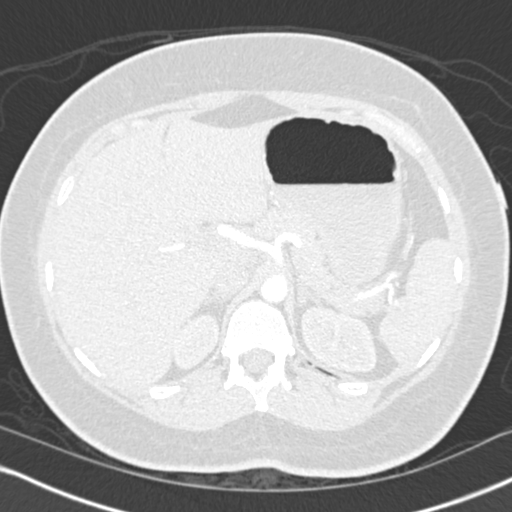
[im 30/277  mediastinal]
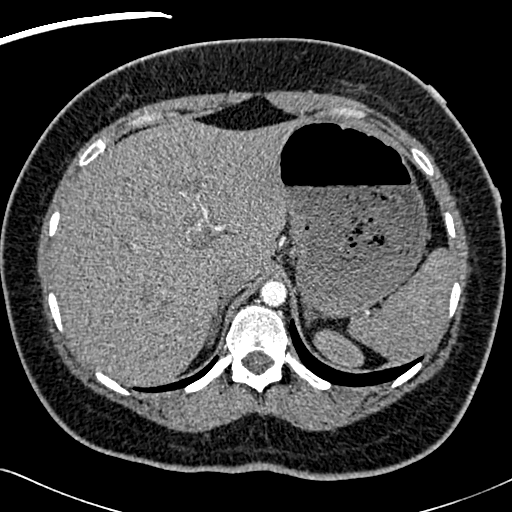
[im 44/277  lung]
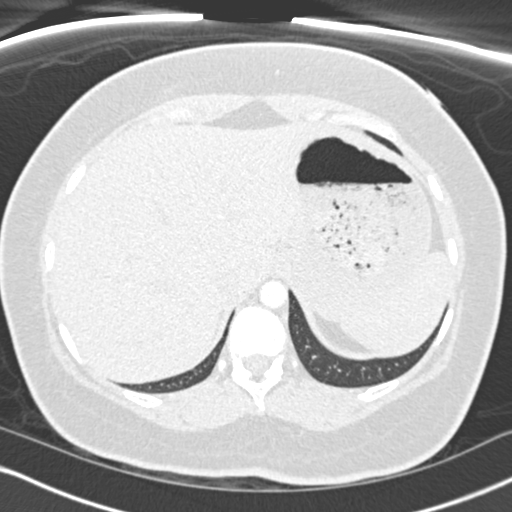
[im 59/277  mediastinal]
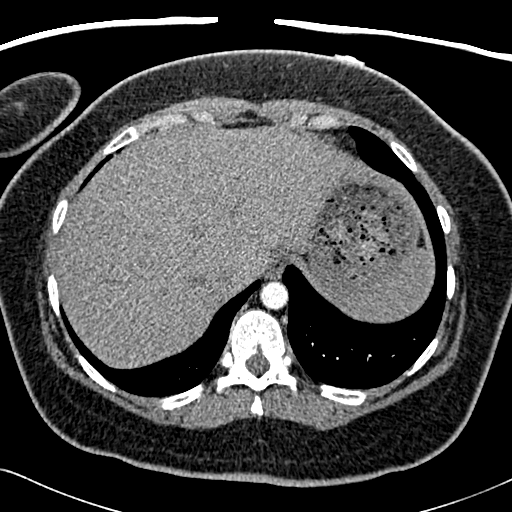
[im 73/277  lung]
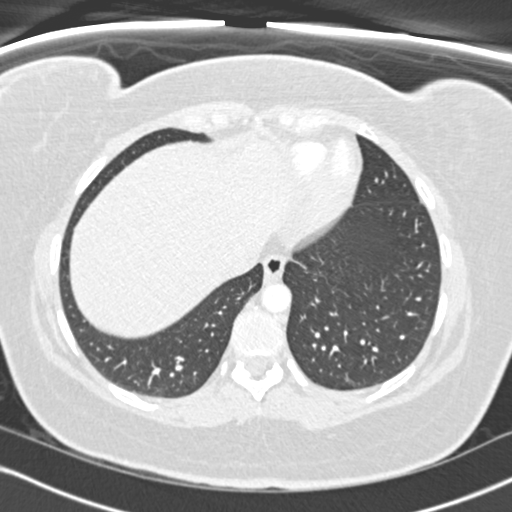
[im 88/277  mediastinal]
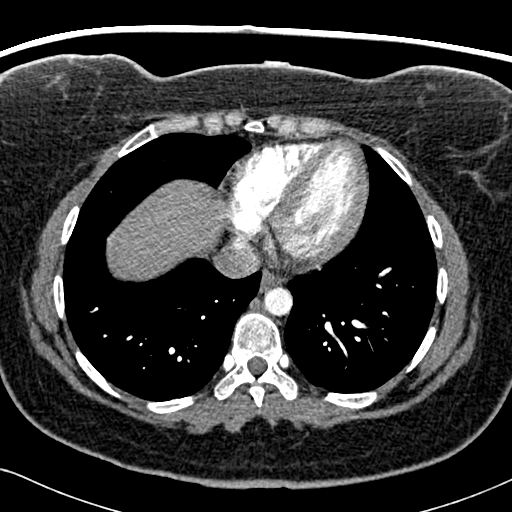
[im 102/277  lung]
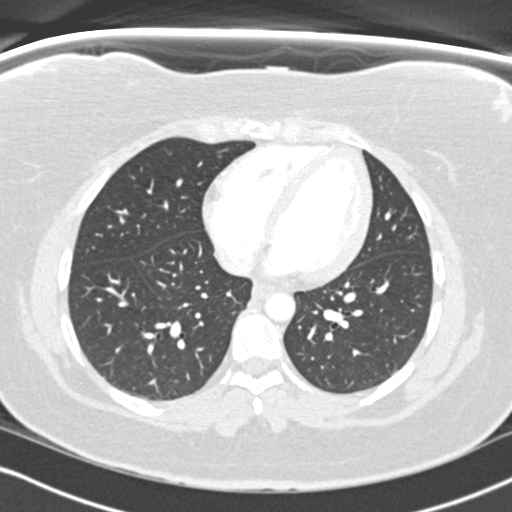
[im 117/277  mediastinal]
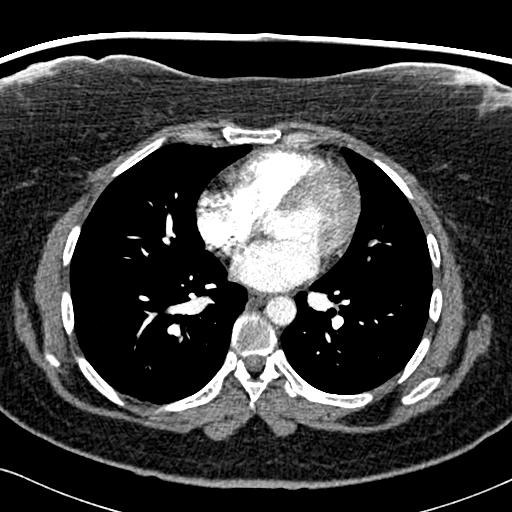
[im 131/277  lung]
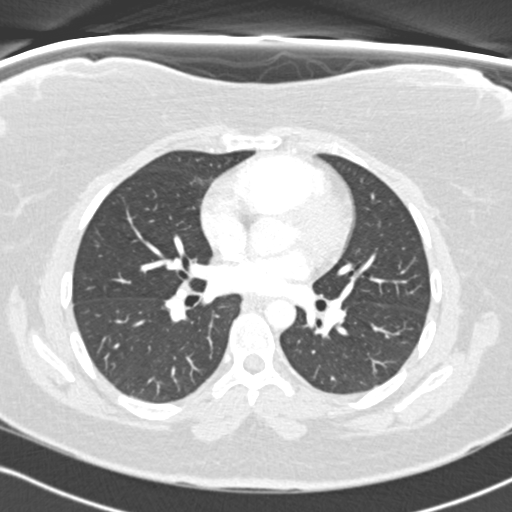
[im 146/277  mediastinal]
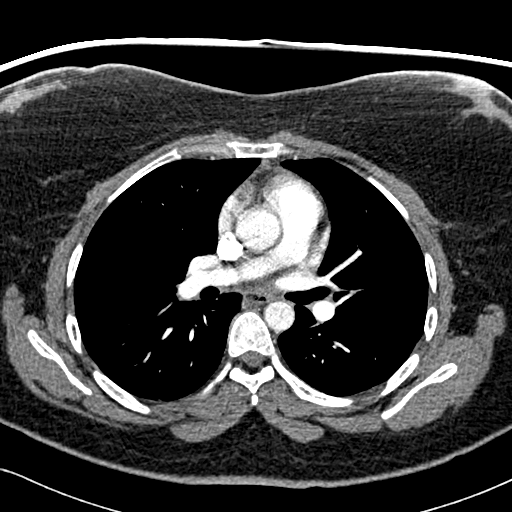
[im 160/277  lung]
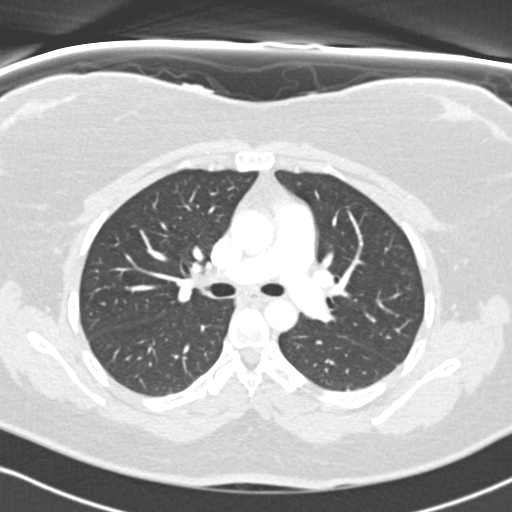
[im 175/277  mediastinal]
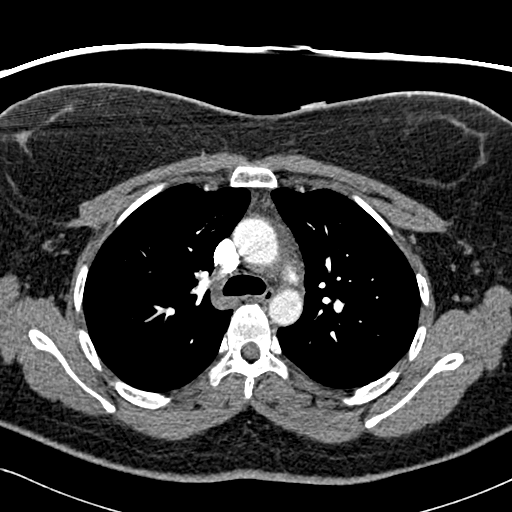
[im 189/277  lung]
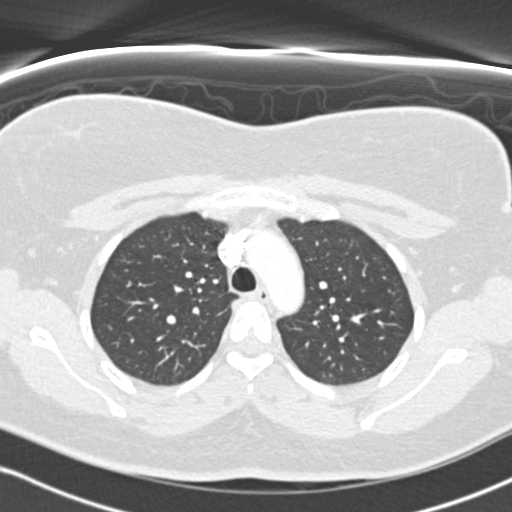
[im 204/277  mediastinal]
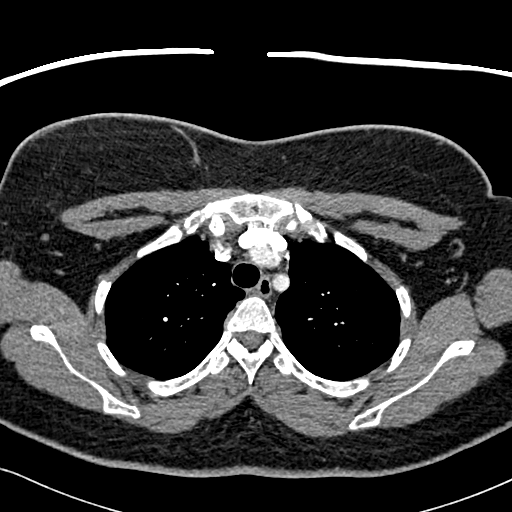
[im 218/277  lung]
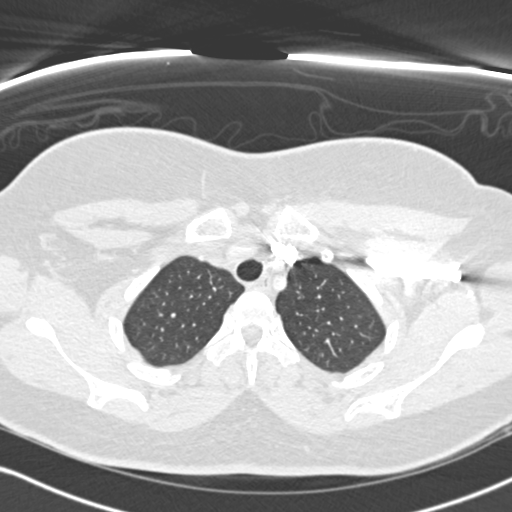
[im 233/277  mediastinal]
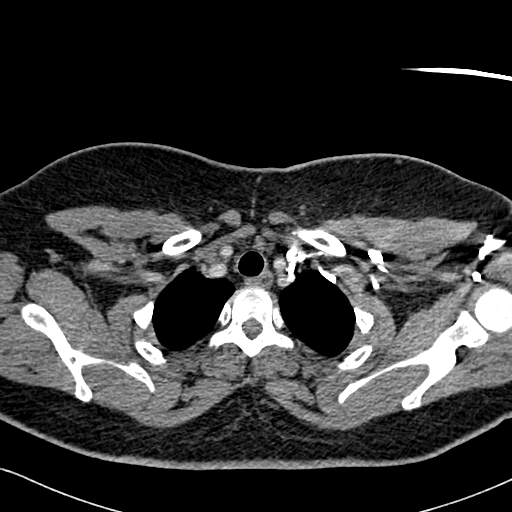
[im 247/277  lung]
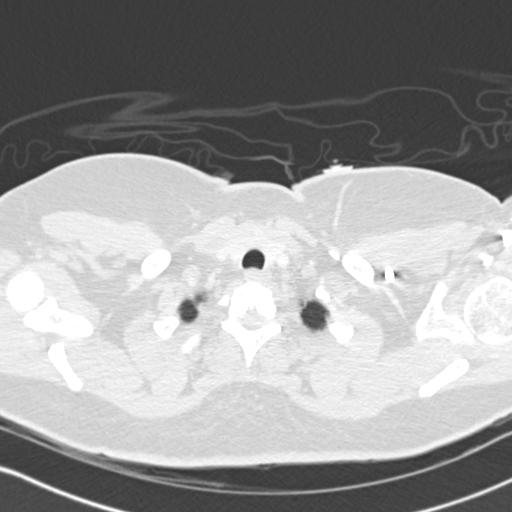
[im 262/277  mediastinal]
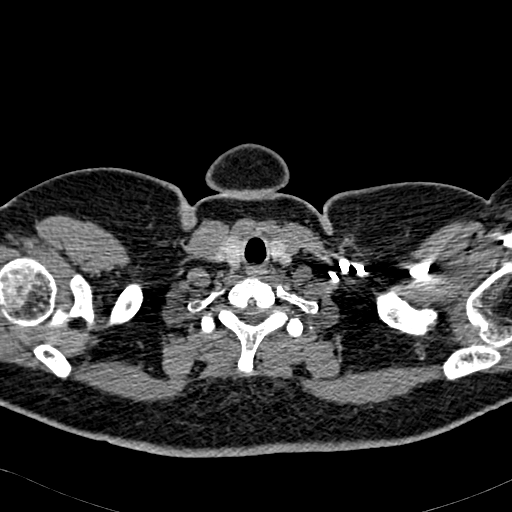

[Series 602: coronals · coronal · 0.63mm/px · 1 of 64 slices shown]
[im 32/64  mediastinal]
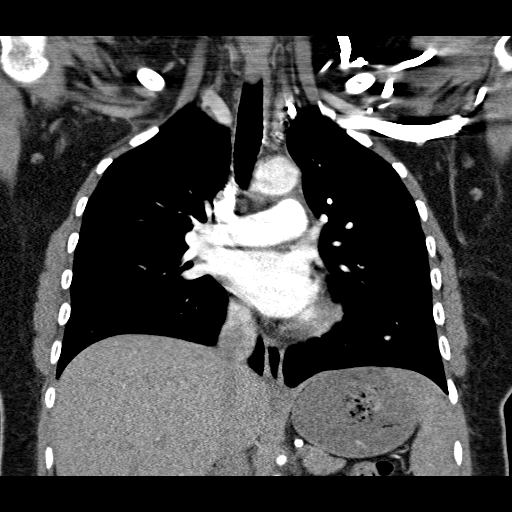

[19 of 36 positions shown; findings below may reference images not displayed]

FINDINGS: In three planes there are no pulmonary emboli.  No
pleural or pericardial fluid.  No mediastinal or hilar adenopathy.
No enlarged axillary nodes or chest wall masses.

No dissection or focal aneurysm of the thoracic aorta. The lungs
are clear.  No osseous lesions.
IMPRESSION: 1.  No evidence for pulmonary emboli.
2.  Thoracic aorta normal.
3.  Lungs clear.
4.  No acute findings.

## 2009-11-17 ENCOUNTER — Emergency Department (HOSPITAL_COMMUNITY): Admission: EM | Admit: 2009-11-17 | Discharge: 2009-11-18 | Payer: Self-pay | Admitting: Emergency Medicine

## 2009-11-17 ENCOUNTER — Emergency Department (HOSPITAL_COMMUNITY)
Admission: EM | Admit: 2009-11-17 | Discharge: 2009-11-17 | Payer: Self-pay | Source: Home / Self Care | Admitting: Family Medicine

## 2010-01-19 HISTORY — PX: KNEE SURGERY: SHX244

## 2010-03-14 IMAGING — CT CT HEAD W/O CM
2 series · 16 of 30 positions shown, 18 images · non-contrast
Comparison: None

CLINICAL DATA: Headaches and dizziness since falling 05/02/2008.

CT HEAD WITHOUT CONTRAST
TECHNIQUE: Contiguous axial images were obtained from the base of
the skull through the vertex without contrast.

[Series 2: brain · axial · 0.49mm/px · z∈[-390,-279]mm · 8 of 30 slices shown, 10 images]
[im 4/30  brain]
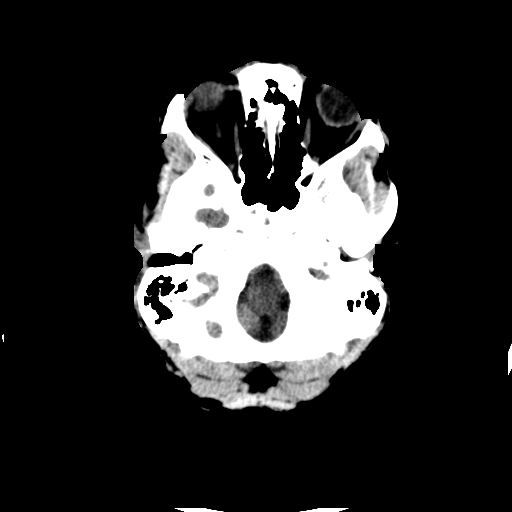
[im 4/30  bone]
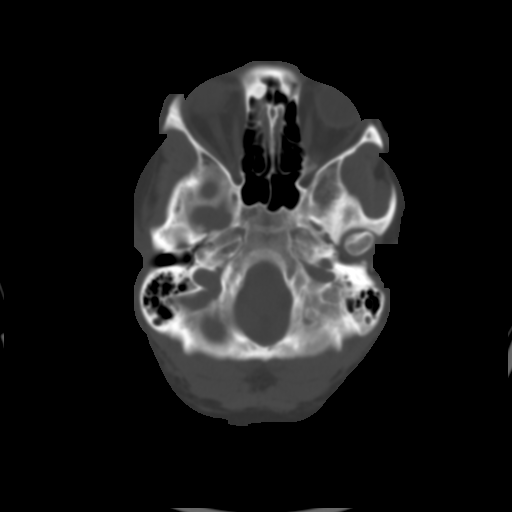
[im 7/30  brain]
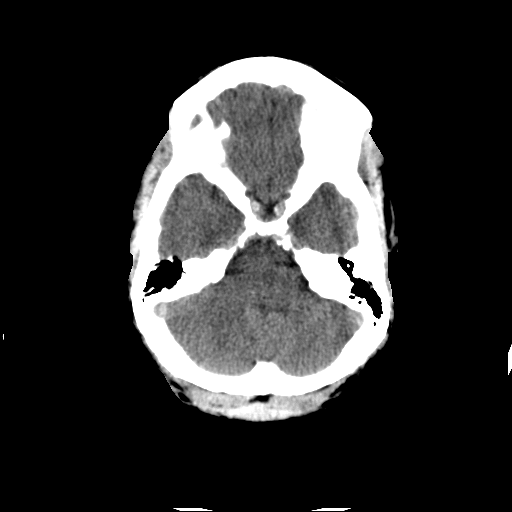
[im 10/30  brain]
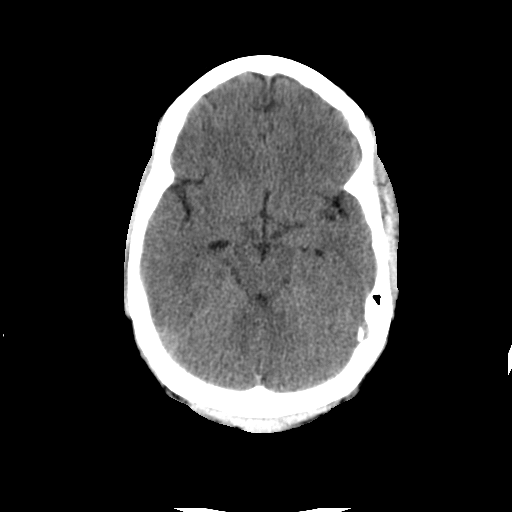
[im 13/30  brain]
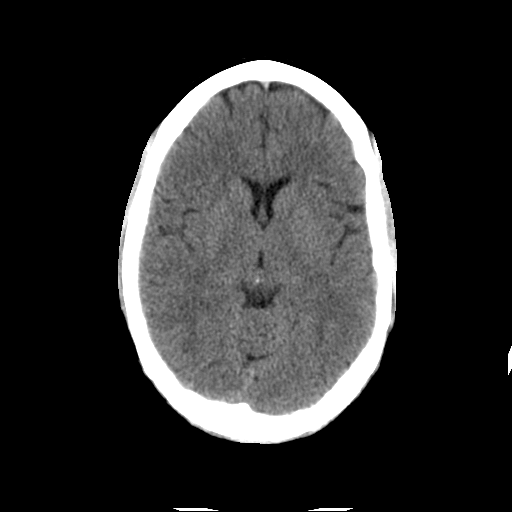
[im 17/30  brain]
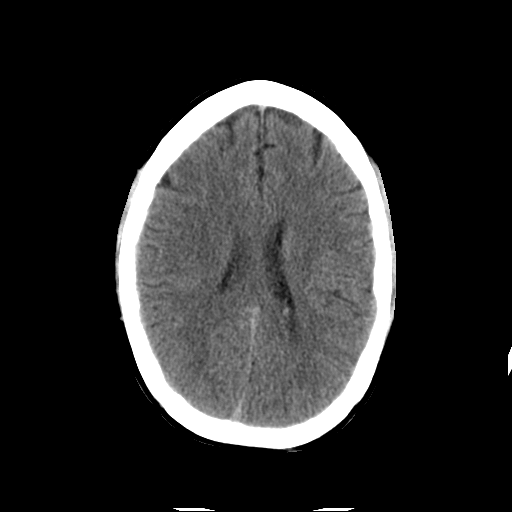
[im 17/30  bone]
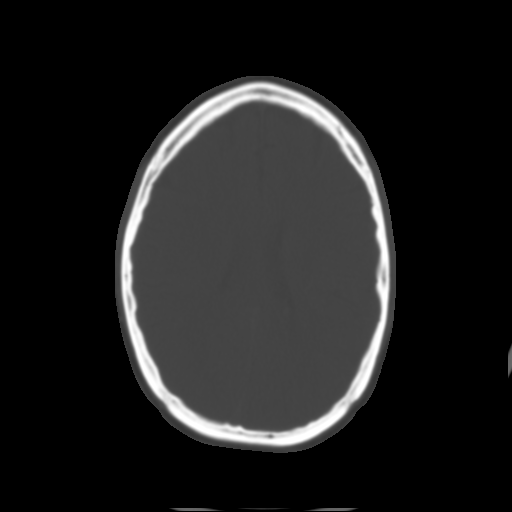
[im 20/30  brain]
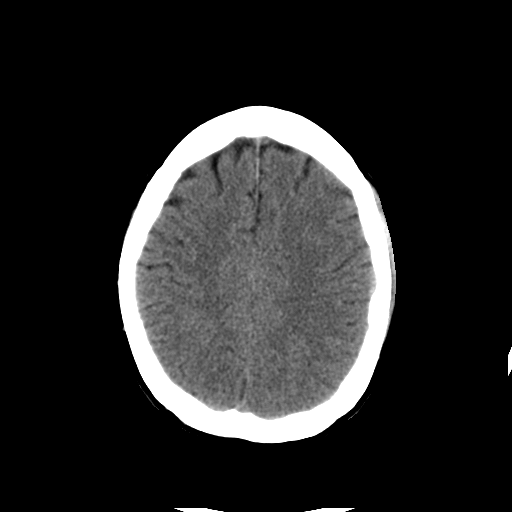
[im 23/30  brain]
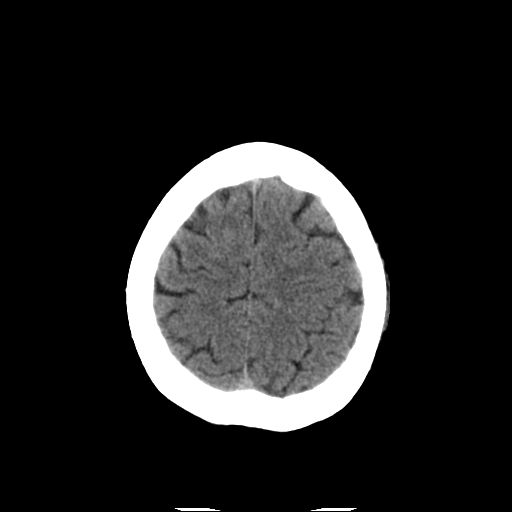
[im 26/30  brain]
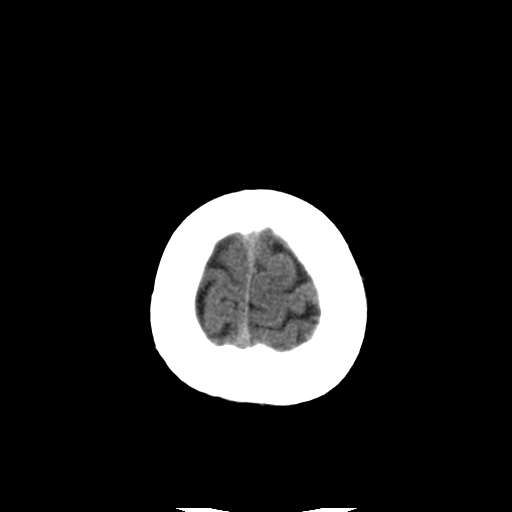

[Series 3: bone windows · axial · 0.49mm/px · z∈[-392,-275]mm · 8 of 60 slices shown]
[im 7/60  bone]
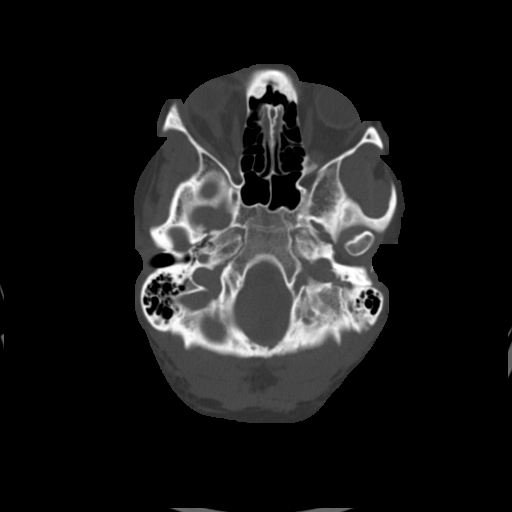
[im 13/60  bone]
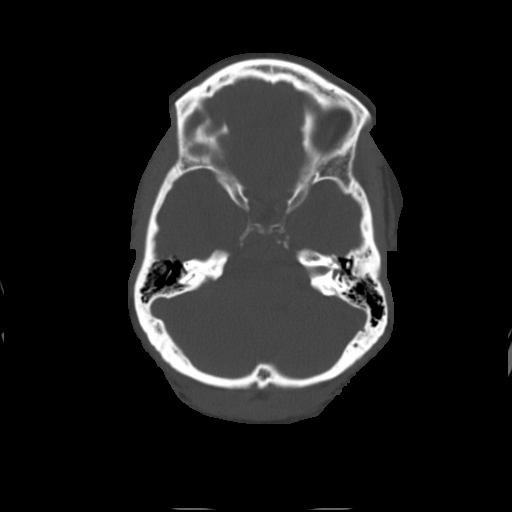
[im 19/60  bone]
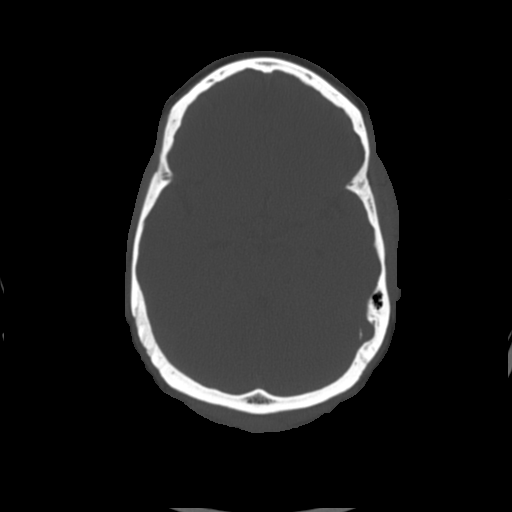
[im 25/60  bone]
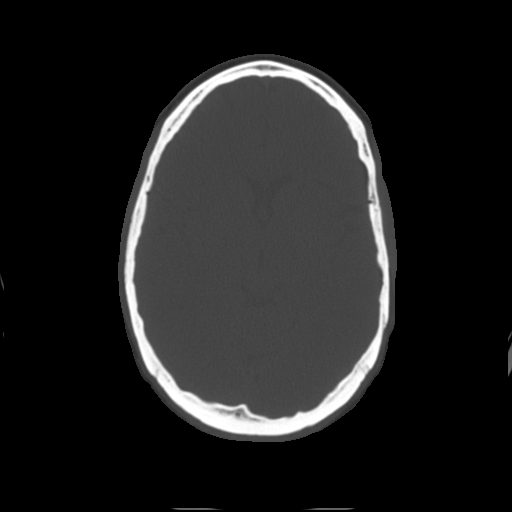
[im 35/60  bone]
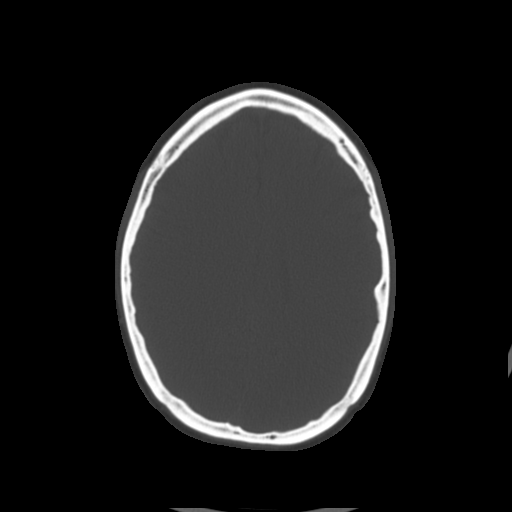
[im 41/60  bone]
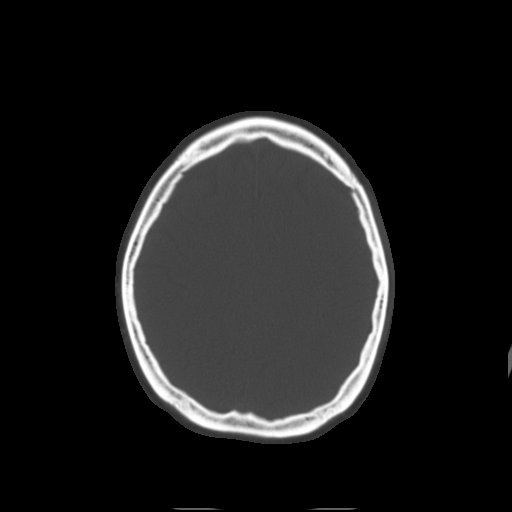
[im 47/60  bone]
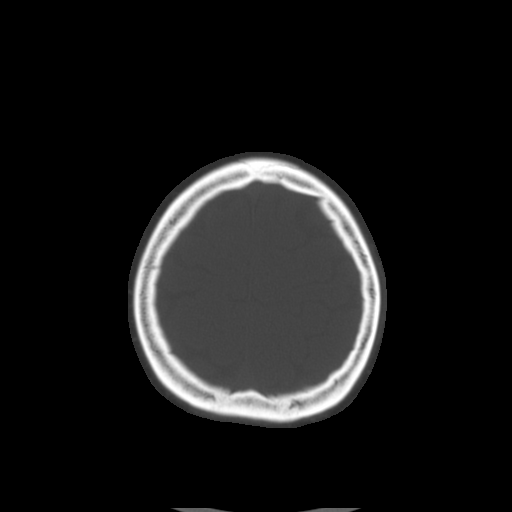
[im 53/60  bone]
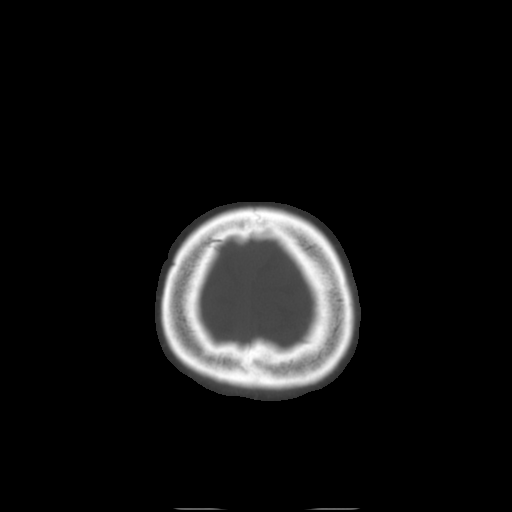

[16 of 30 positions shown; findings below may reference images not displayed]

FINDINGS: There is no evidence of acute intracranial hemorrhage,
mass lesion, brain edema or extra-axial fluid collection.  The
ventricles and subarachnoid spaces are appropriately sized for age.
There is no CT evidence of acute cortical infarction.

The visualized paranasal sinuses are clear.  The calvarium is
intact.  Mild soft tissue swelling is noted at the left parietal
scalp.
IMPRESSION: No acute intracranial or calvarial findings.  No evidence of acute
intracranial hemorrhage.

## 2010-04-02 LAB — CBC
HCT: 45.7 % (ref 36.0–46.0)
Hemoglobin: 16.1 g/dL — ABNORMAL HIGH (ref 12.0–15.0)
MCH: 33.8 pg (ref 26.0–34.0)
MCHC: 35.2 g/dL (ref 30.0–36.0)
MCV: 95.8 fL (ref 78.0–100.0)
Platelets: 351 10*3/uL (ref 150–400)
RBC: 4.77 MIL/uL (ref 3.87–5.11)
RDW: 12.3 % (ref 11.5–15.5)
WBC: 12.9 10*3/uL — ABNORMAL HIGH (ref 4.0–10.5)

## 2010-04-02 LAB — GLUCOSE, CAPILLARY
Glucose-Capillary: 68 mg/dL — ABNORMAL LOW (ref 70–99)
Glucose-Capillary: 75 mg/dL (ref 70–99)

## 2010-04-02 LAB — URINALYSIS, ROUTINE W REFLEX MICROSCOPIC
Glucose, UA: >1000 mg/dL — AB
Hgb urine dipstick: NEGATIVE
Ketones, ur: 15 mg/dL — AB
Leukocytes, UA: NEGATIVE
Nitrite: NEGATIVE
Protein, ur: 30 mg/dL — AB
Specific Gravity, Urine: 1.031 — ABNORMAL HIGH (ref 1.005–1.030)
Urobilinogen, UA: 0.2 mg/dL (ref 0.0–1.0)
pH: 5 (ref 5.0–8.0)

## 2010-04-02 LAB — COMPREHENSIVE METABOLIC PANEL
ALT: 16 U/L (ref 0–35)
AST: 17 U/L (ref 0–37)
Albumin: 3.8 g/dL (ref 3.5–5.2)
Alkaline Phosphatase: 81 U/L (ref 39–117)
BUN: 11 mg/dL (ref 6–23)
CO2: 29 mEq/L (ref 19–32)
Calcium: 9.6 mg/dL (ref 8.4–10.5)
Chloride: 106 mEq/L (ref 96–112)
Creatinine, Ser: 0.88 mg/dL (ref 0.4–1.2)
GFR calc Af Amer: >60 mL/min (ref >60)
GFR calc non Af Amer: >60 mL/min (ref >60)
Glucose, Bld: 65 mg/dL — ABNORMAL LOW (ref 70–99)
Potassium: 4.1 mEq/L (ref 3.5–5.1)
Sodium: 141 mEq/L (ref 135–145)
Total Bilirubin: 0.6 mg/dL (ref 0.3–1.2)
Total Protein: 7.3 g/dL (ref 6.0–8.3)

## 2010-04-02 LAB — URINE MICROSCOPIC-ADD ON

## 2010-04-02 LAB — DIFFERENTIAL
Basophils Absolute: 0 10*3/uL (ref 0.0–0.1)
Basophils Relative: 0 % (ref 0–1)
Eosinophils Absolute: 0.2 10*3/uL (ref 0.0–0.7)
Eosinophils Relative: 2 % (ref 0–5)
Lymphocytes Relative: 31 % (ref 12–46)
Lymphs Abs: 4 10*3/uL (ref 0.7–4.0)
Monocytes Absolute: 0.9 10*3/uL (ref 0.1–1.0)
Monocytes Relative: 7 % (ref 3–12)
Neutro Abs: 7.8 10*3/uL — ABNORMAL HIGH (ref 1.7–7.7)
Neutrophils Relative %: 60 % (ref 43–77)

## 2010-04-02 LAB — LIPASE, BLOOD: Lipase: 27 U/L (ref 11–59)

## 2010-04-02 LAB — D-DIMER, QUANTITATIVE: D-Dimer, Quant: 0.36 ug/mL-FEU (ref 0.00–0.48)

## 2010-04-03 ENCOUNTER — Emergency Department (HOSPITAL_COMMUNITY)
Admission: EM | Admit: 2010-04-03 | Discharge: 2010-04-04 | Disposition: A | Discharge status: Home / Self Care | Payer: Managed Care, Other (non HMO) | Attending: Emergency Medicine | Admitting: Emergency Medicine

## 2010-04-03 DIAGNOSIS — IMO0001 Reserved for inherently not codable concepts without codable children: Secondary | ICD-10-CM | POA: Insufficient documentation

## 2010-04-03 DIAGNOSIS — L03221 Cellulitis of neck: Secondary | ICD-10-CM | POA: Insufficient documentation

## 2010-04-03 DIAGNOSIS — R509 Fever, unspecified: Secondary | ICD-10-CM | POA: Insufficient documentation

## 2010-04-03 DIAGNOSIS — R112 Nausea with vomiting, unspecified: Secondary | ICD-10-CM | POA: Insufficient documentation

## 2010-04-03 DIAGNOSIS — Z794 Long term (current) use of insulin: Secondary | ICD-10-CM | POA: Insufficient documentation

## 2010-04-03 DIAGNOSIS — L0211 Cutaneous abscess of neck: Secondary | ICD-10-CM | POA: Insufficient documentation

## 2010-04-03 DIAGNOSIS — E119 Type 2 diabetes mellitus without complications: Secondary | ICD-10-CM | POA: Insufficient documentation

## 2010-04-03 DIAGNOSIS — R109 Unspecified abdominal pain: Secondary | ICD-10-CM | POA: Insufficient documentation

## 2010-04-03 LAB — POCT I-STAT, CHEM 8
BUN: 9 mg/dL (ref 6–23)
Calcium, Ion: 1.05 mmol/L — ABNORMAL LOW (ref 1.12–1.32)
Chloride: 103 mEq/L (ref 96–112)
Creatinine, Ser: 1 mg/dL (ref 0.4–1.2)
Glucose, Bld: 139 mg/dL — ABNORMAL HIGH (ref 70–99)
HCT: 49 % — ABNORMAL HIGH (ref 36.0–46.0)
Hemoglobin: 16.7 g/dL — ABNORMAL HIGH (ref 12.0–15.0)
Potassium: 4.4 mEq/L (ref 3.5–5.1)
Sodium: 137 mEq/L (ref 135–145)
TCO2: 25 mmol/L (ref 0–100)

## 2010-04-03 LAB — CBC
HCT: 45 % (ref 36.0–46.0)
Hemoglobin: 15.8 g/dL — ABNORMAL HIGH (ref 12.0–15.0)
MCH: 33.3 pg (ref 26.0–34.0)
MCHC: 35.1 g/dL (ref 30.0–36.0)
MCV: 94.9 fL (ref 78.0–100.0)
Platelets: 339 10*3/uL (ref 150–400)
RBC: 4.74 MIL/uL (ref 3.87–5.11)
RDW: 12.2 % (ref 11.5–15.5)
WBC: 10.4 10*3/uL (ref 4.0–10.5)

## 2010-04-03 LAB — URINALYSIS, ROUTINE W REFLEX MICROSCOPIC
Bilirubin Urine: NEGATIVE
Glucose, UA: 250 mg/dL — AB
Hgb urine dipstick: NEGATIVE
Ketones, ur: 15 mg/dL — AB
Leukocytes, UA: NEGATIVE
Nitrite: NEGATIVE
Protein, ur: 30 mg/dL — AB
Specific Gravity, Urine: 1.025 (ref 1.005–1.030)
Urobilinogen, UA: 0.2 mg/dL (ref 0.0–1.0)
pH: 5 (ref 5.0–8.0)

## 2010-04-03 LAB — URINE MICROSCOPIC-ADD ON

## 2010-04-03 LAB — GLUCOSE, CAPILLARY: Glucose-Capillary: 180 mg/dL — ABNORMAL HIGH (ref 70–99)

## 2010-04-03 LAB — POCT PREGNANCY, URINE: Preg Test, Ur: NEGATIVE

## 2010-04-06 LAB — CBC
HCT: 42.8 % (ref 36.0–46.0)
Hemoglobin: 14.8 g/dL (ref 12.0–15.0)
MCHC: 34.7 g/dL (ref 30.0–36.0)
MCV: 96.9 fL (ref 78.0–100.0)
Platelets: 383 10*3/uL (ref 150–400)
RBC: 4.42 MIL/uL (ref 3.87–5.11)
RDW: 12.4 % (ref 11.5–15.5)
WBC: 13 10*3/uL — ABNORMAL HIGH (ref 4.0–10.5)

## 2010-04-06 LAB — POCT PREGNANCY, URINE: Preg Test, Ur: NEGATIVE

## 2010-04-06 LAB — DIFFERENTIAL
Basophils Absolute: 0 10*3/uL (ref 0.0–0.1)
Basophils Relative: 0 % (ref 0–1)
Eosinophils Absolute: 0.1 10*3/uL (ref 0.0–0.7)
Eosinophils Relative: 1 % (ref 0–5)
Lymphocytes Relative: 19 % (ref 12–46)
Lymphs Abs: 2.5 10*3/uL (ref 0.7–4.0)
Monocytes Absolute: 0.2 10*3/uL (ref 0.1–1.0)
Monocytes Relative: 2 % — ABNORMAL LOW (ref 3–12)
Neutro Abs: 10.1 10*3/uL — ABNORMAL HIGH (ref 1.7–7.7)
Neutrophils Relative %: 78 % — ABNORMAL HIGH (ref 43–77)

## 2010-04-06 LAB — BASIC METABOLIC PANEL
BUN: 9 mg/dL (ref 6–23)
CO2: 29 mEq/L (ref 19–32)
Calcium: 9.8 mg/dL (ref 8.4–10.5)
Chloride: 98 mEq/L (ref 96–112)
Creatinine, Ser: 0.93 mg/dL (ref 0.4–1.2)
GFR calc Af Amer: >60 mL/min (ref >60)
GFR calc non Af Amer: >60 mL/min (ref >60)
Glucose, Bld: 372 mg/dL — ABNORMAL HIGH (ref 70–99)
Potassium: 4.2 mEq/L (ref 3.5–5.1)
Sodium: 136 mEq/L (ref 135–145)

## 2010-04-06 LAB — URINALYSIS, ROUTINE W REFLEX MICROSCOPIC
Bilirubin Urine: NEGATIVE
Glucose, UA: >1000 mg/dL — AB
Hgb urine dipstick: NEGATIVE
Ketones, ur: NEGATIVE mg/dL
Leukocytes, UA: NEGATIVE
Nitrite: NEGATIVE
Protein, ur: NEGATIVE mg/dL
Specific Gravity, Urine: 1.039 — ABNORMAL HIGH (ref 1.005–1.030)
Urobilinogen, UA: 0.2 mg/dL (ref 0.0–1.0)
pH: 5.5 (ref 5.0–8.0)

## 2010-04-06 LAB — URINE MICROSCOPIC-ADD ON

## 2010-04-23 LAB — CBC
HCT: 43.8 % (ref 36.0–46.0)
Hemoglobin: 14.9 g/dL (ref 12.0–15.0)
MCHC: 34 g/dL (ref 30.0–36.0)
MCV: 97.7 fL (ref 78.0–100.0)
Platelets: 355 10*3/uL (ref 150–400)
RBC: 4.48 MIL/uL (ref 3.87–5.11)
RDW: 12.2 % (ref 11.5–15.5)
WBC: 11.2 10*3/uL — ABNORMAL HIGH (ref 4.0–10.5)

## 2010-04-23 LAB — DIFFERENTIAL
Basophils Absolute: 0.1 10*3/uL (ref 0.0–0.1)
Basophils Relative: 1 % (ref 0–1)
Eosinophils Absolute: 0.1 10*3/uL (ref 0.0–0.7)
Eosinophils Relative: 1 % (ref 0–5)
Lymphocytes Relative: 26 % (ref 12–46)
Lymphs Abs: 2.9 10*3/uL (ref 0.7–4.0)
Monocytes Absolute: 0.6 10*3/uL (ref 0.1–1.0)
Monocytes Relative: 6 % (ref 3–12)
Neutro Abs: 7.5 10*3/uL (ref 1.7–7.7)
Neutrophils Relative %: 67 % (ref 43–77)

## 2010-04-23 LAB — POCT URINALYSIS DIP (DEVICE)
Bilirubin Urine: NEGATIVE
Glucose, UA: 500 mg/dL — AB
Hgb urine dipstick: NEGATIVE
Ketones, ur: NEGATIVE mg/dL
Nitrite: NEGATIVE
Protein, ur: NEGATIVE mg/dL
Specific Gravity, Urine: <=1.005 (ref 1.005–1.030)
Urobilinogen, UA: 0.2 mg/dL (ref 0.0–1.0)
pH: 6 (ref 5.0–8.0)

## 2010-04-23 LAB — COMPREHENSIVE METABOLIC PANEL
ALT: 16 U/L (ref 0–35)
AST: 18 U/L (ref 0–37)
Albumin: 3.6 g/dL (ref 3.5–5.2)
Alkaline Phosphatase: 102 U/L (ref 39–117)
BUN: 6 mg/dL (ref 6–23)
CO2: 27 mEq/L (ref 19–32)
Calcium: 9.4 mg/dL (ref 8.4–10.5)
Chloride: 97 mEq/L (ref 96–112)
Creatinine, Ser: 0.87 mg/dL (ref 0.4–1.2)
GFR calc Af Amer: >60 mL/min (ref >60)
GFR calc non Af Amer: >60 mL/min (ref >60)
Glucose, Bld: 406 mg/dL — ABNORMAL HIGH (ref 70–99)
Potassium: 4.1 mEq/L (ref 3.5–5.1)
Sodium: 133 mEq/L — ABNORMAL LOW (ref 135–145)
Total Bilirubin: 0.4 mg/dL (ref 0.3–1.2)
Total Protein: 7.5 g/dL (ref 6.0–8.3)

## 2010-04-23 LAB — LIPASE, BLOOD: Lipase: 17 U/L (ref 11–59)

## 2010-04-26 LAB — GLUCOSE, CAPILLARY
Glucose-Capillary: 85 mg/dL (ref 70–99)
Glucose-Capillary: 87 mg/dL (ref 70–99)

## 2010-04-29 LAB — CBC
HCT: 41.3 % (ref 36.0–46.0)
Hemoglobin: 14.5 g/dL (ref 12.0–15.0)
MCHC: 35 g/dL (ref 30.0–36.0)
MCV: 97.8 fL (ref 78.0–100.0)
Platelets: 373 10*3/uL (ref 150–400)
RBC: 4.22 MIL/uL (ref 3.87–5.11)
RDW: 13.5 % (ref 11.5–15.5)
WBC: 12.4 10*3/uL — ABNORMAL HIGH (ref 4.0–10.5)

## 2010-04-29 LAB — BONE MARROW EXAM

## 2010-04-29 LAB — DIFFERENTIAL
Basophils Absolute: 0.1 10*3/uL (ref 0.0–0.1)
Basophils Relative: 1 % (ref 0–1)
Eosinophils Absolute: 0.3 10*3/uL (ref 0.0–0.7)
Eosinophils Relative: 3 % (ref 0–5)
Lymphocytes Relative: 16 % (ref 12–46)
Lymphs Abs: 1.9 10*3/uL (ref 0.7–4.0)
Monocytes Absolute: 0.6 10*3/uL (ref 0.1–1.0)
Monocytes Relative: 5 % (ref 3–12)
Neutro Abs: 9.4 10*3/uL — ABNORMAL HIGH (ref 1.7–7.7)
Neutrophils Relative %: 76 % (ref 43–77)

## 2010-04-29 LAB — GLUCOSE, CAPILLARY: Glucose-Capillary: 84 mg/dL (ref 70–99)

## 2010-04-29 LAB — CHROMOSOME ANALYSIS, BONE MARROW

## 2010-05-25 IMAGING — MR MR [PERSON_NAME] UP JT W/O CM*R*
4 of 8 series · 20 of 40 positions shown · non-contrast
Comparison: None

CLINICAL DATA: Fell.  Persistent pain.

MRI RIGHT WRIST WITHOUT CONTRAST
TECHNIQUE: Multiplanar, multisequence MR imaging of the right
wrist was performed.  No intravenous contrast was administered.

[Series 4: T2 fat-sat · coronal · 3.0mm · 0.20mm/px · 4 of 15 slices shown (1 of 2)]
[im 1/15]
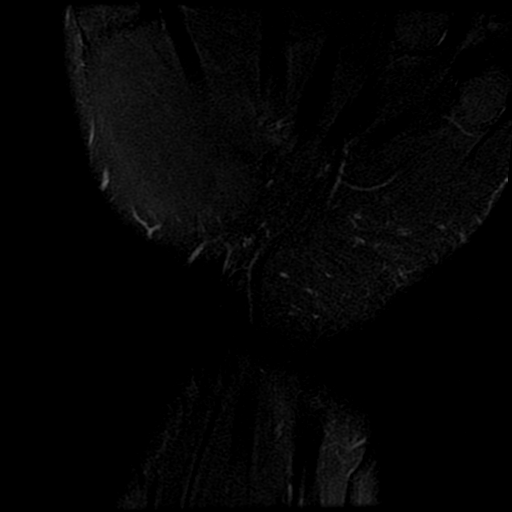
[im 5/15]
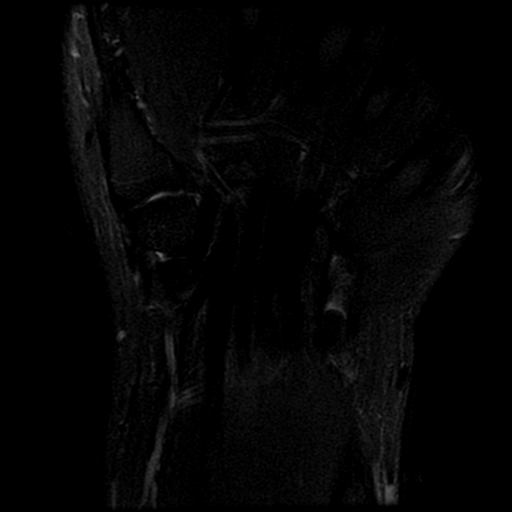
[im 10/15]
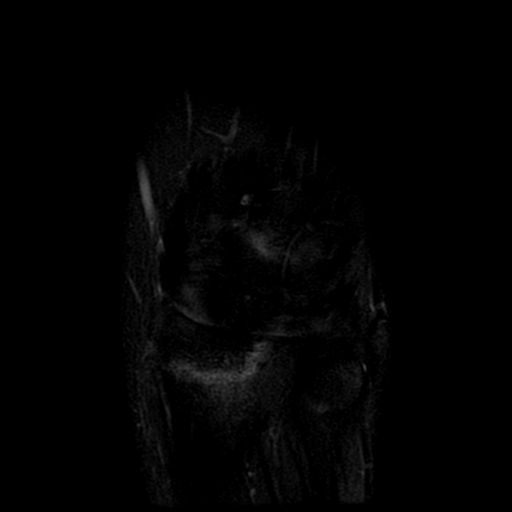
[im 15/15]
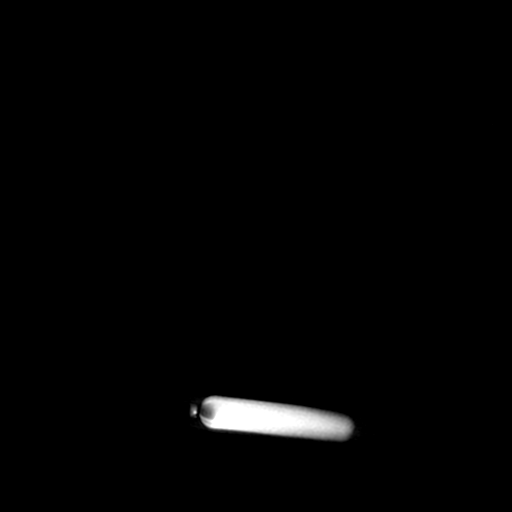

[Series 5: PD fat-sat · coronal · 3.0mm · 0.20mm/px · 5 of 16 slices shown (1 of 2)]
[im 1/16]
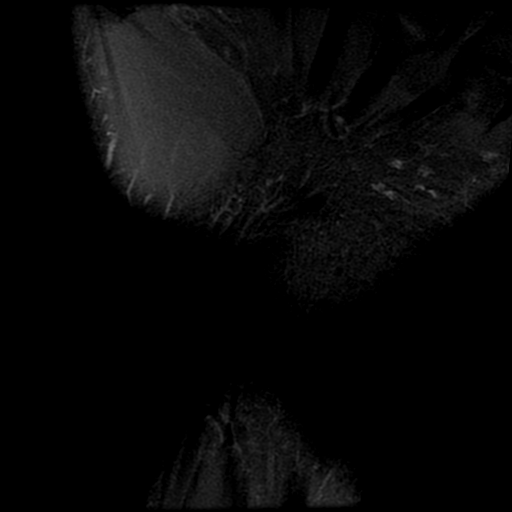
[im 4/16]
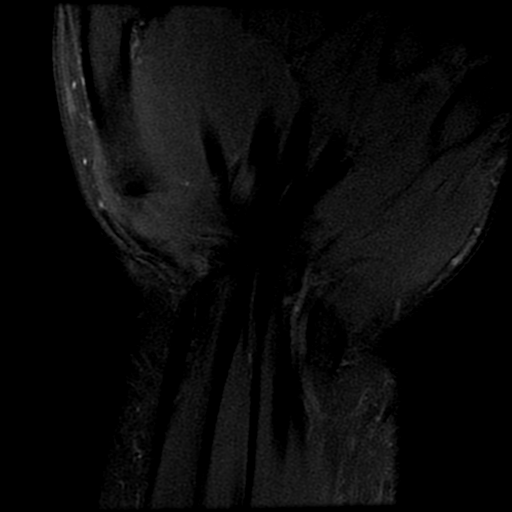
[im 8/16]
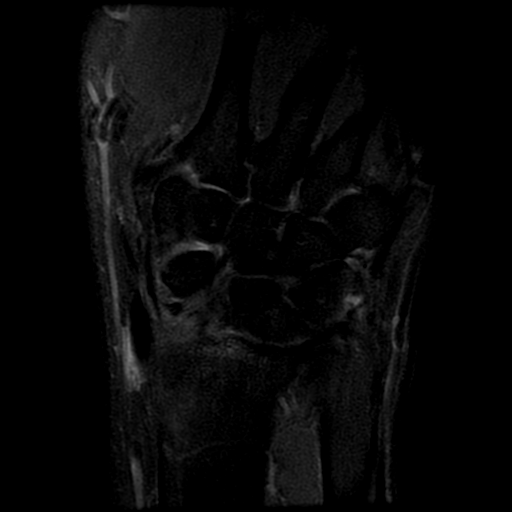
[im 12/16]
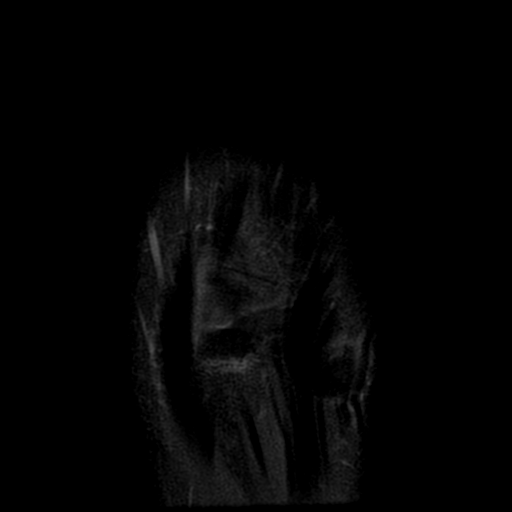
[im 16/16]
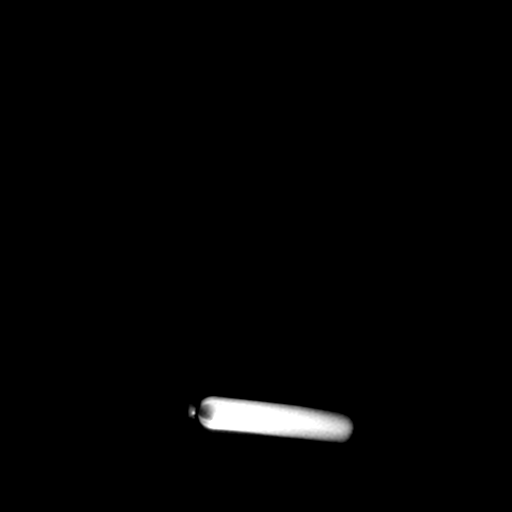

[Series 7: T2 fat-sat · axial · 3.0mm · 0.20mm/px · z∈[-25,+38]mm · 4 of 20 slices shown (2 of 2)]
[im 1/20]
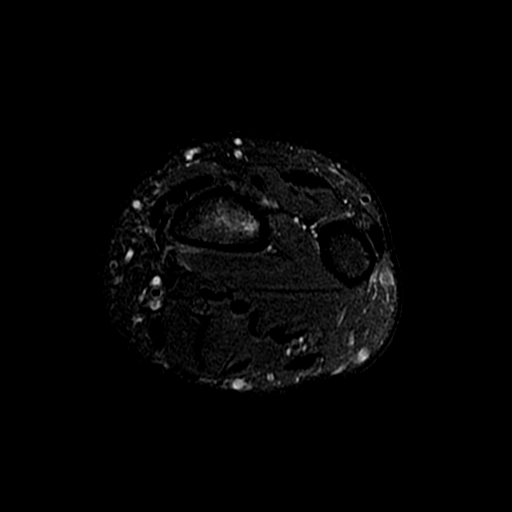
[im 4/20]
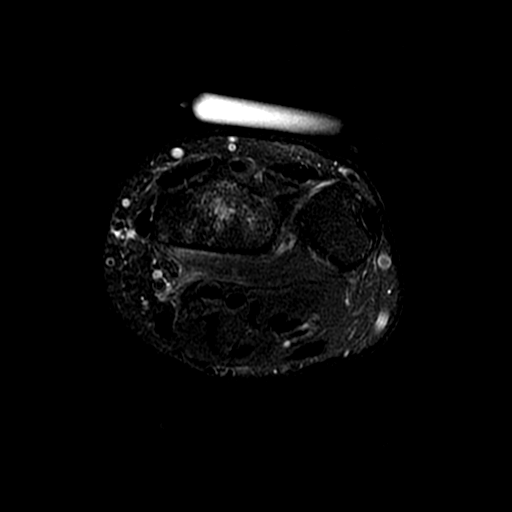
[im 12/20]
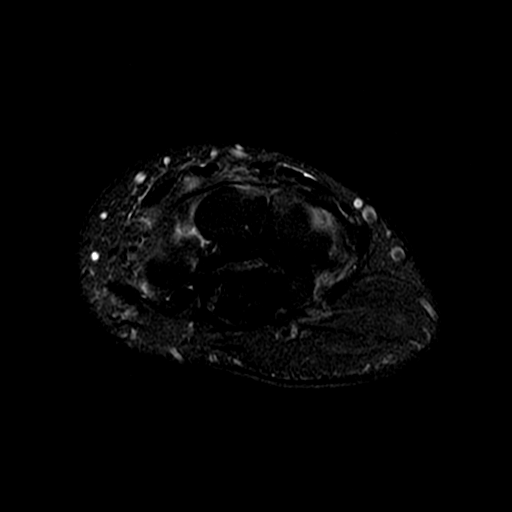
[im 20/20]
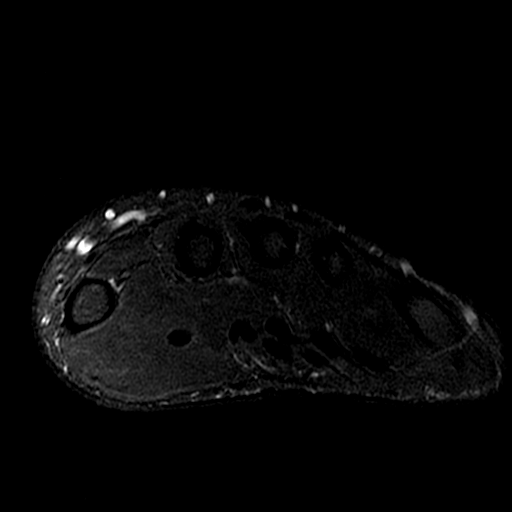

[Series 9: PD fat-sat · sagittal · 3.0mm · 0.20mm/px · 7 of 24 slices shown (2 of 2)]
[im 1/24]
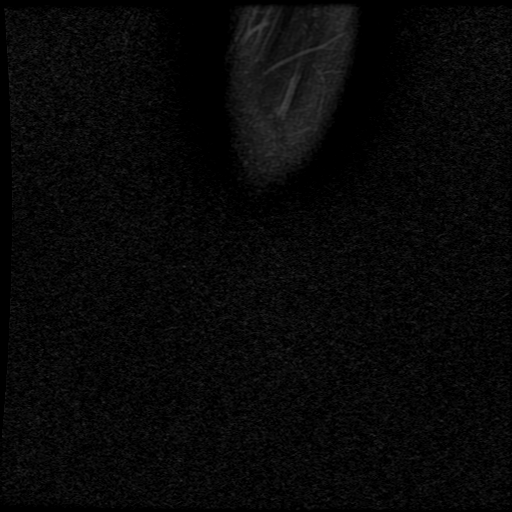
[im 4/24]
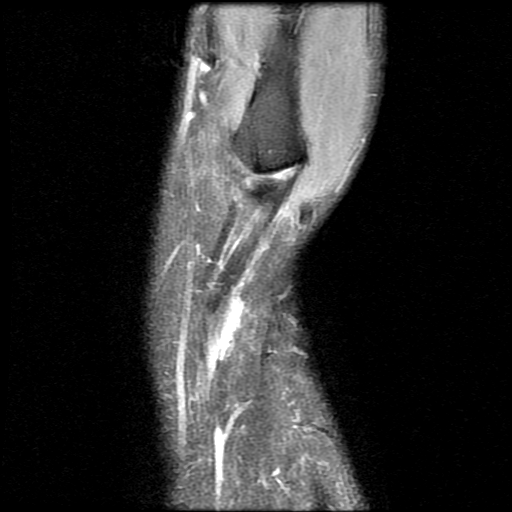
[im 8/24]
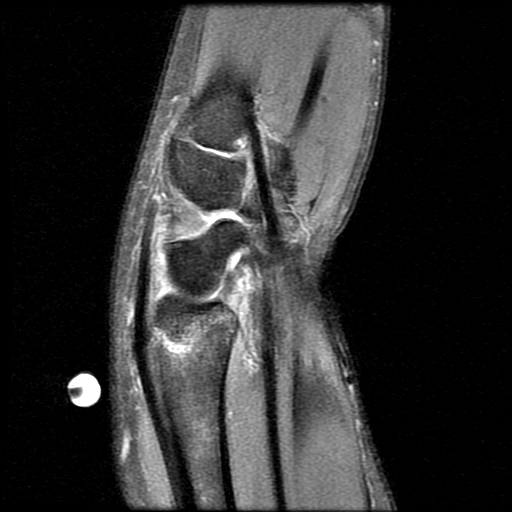
[im 12/24]
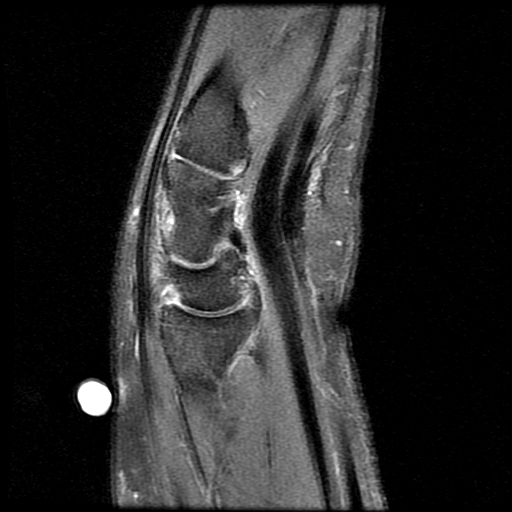
[im 16/24]
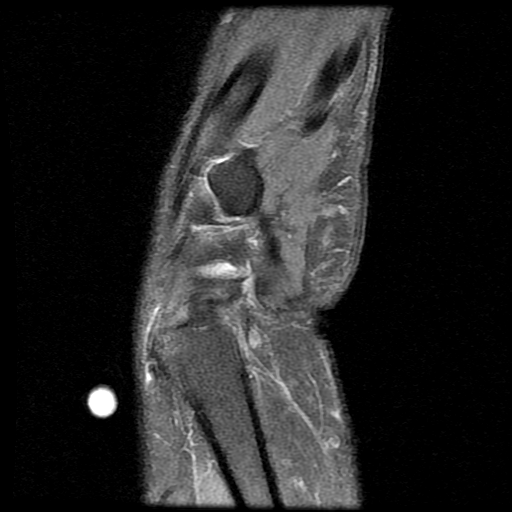
[im 20/24]
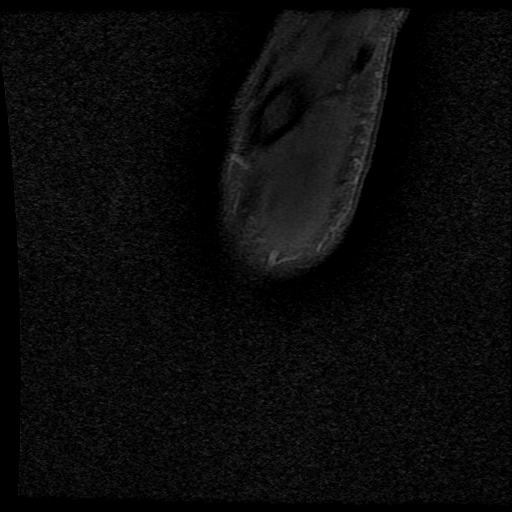
[im 24/24]
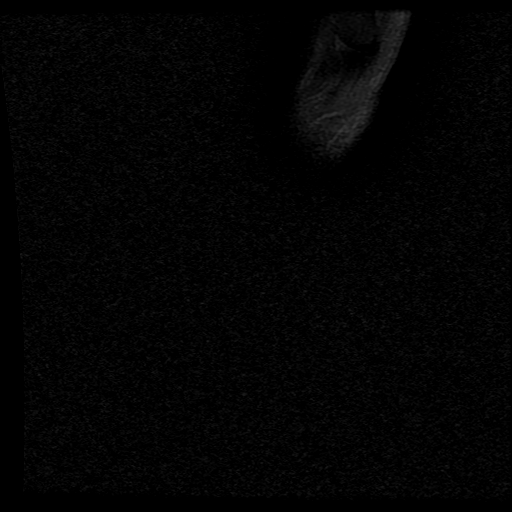

[20 of 40 positions shown; findings below may reference images not displayed]

FINDINGS: There is a nondisplaced intra-articular fracture of the
distal radius.  The radioulnar joint is intact.  No ulnar styloid
fracture is seen.  The intercarpal ligaments are intact.  The major
dorsal and volar wrist tendons are intact.
IMPRESSION: Nondisplaced intra-articular fracture of the distal radius.

## 2010-06-03 NOTE — Op Note (Signed)
NAME:  Maureen [name], Maureen [name]  ACCOUNT NO.:  [id number]   MEDICAL RECORD NO.:  [id number]          PATIENT TYPE:  AMB   LOCATION:  [facility]                         FACILITY:  [facility]   PHYSICIAN:  Rose Phi. Myna Hidalgo, M.D. DATE OF BIRTH:  1970/06/23   DATE OF PROCEDURE:  05/29/2008  DATE OF DISCHARGE:                               OPERATIVE REPORT   NATURE OF PROCEDURE:  Left posterior iliac crest bone marrow biopsy and  aspirate.   [name] was brought to the short stay unit.  She had an  IV placed without any difficulty.  Her ASA class was 1.  Her Mallampati  score was 1.   She was placed onto her right side.  She received a total of 5 mg of  Versed and 25 mg of Demerol for IV sedation.   The left posterior iliac crest region was prepped and draped in a  sterile fashion.  Ten mL of 2% lidocaine was infiltrated on the skin  down to the periosteum.  We needed to use 22 gauge spinal needles to  reach the periosteum.   A #11 scalpel was used to make an incision into the skin.  Two bone  marrow aspirates were obtained without difficulty.   We then obtained an excellent bone marrow biopsy core without  complications.   The patient tolerated the procedure well.  There were no problems.      Rose Phi. Myna Hidalgo, M.D.  Electronically Signed     PRE/MEDQ  D:  05/29/2008  T:  05/29/2008  Job:  [id number]

## 2010-06-03 NOTE — H&P (Signed)
NAME:  Maureen, Maureen [name]  ACCOUNT NO.:  [id number]   MEDICAL RECORD NO.:  [id number]          PATIENT TYPE:  INP   LOCATION:  4715                         FACILITY:  [facility]   PHYSICIAN:  Bevelyn Buckles. Bensimhon, MDDATE OF BIRTH:  04-17-1970   DATE OF ADMISSION:  01/04/2008  DATE OF DISCHARGE:                              HISTORY & PHYSICAL   PRIMARY CARDIOLOGIST:  New will be Bevelyn Buckles. Bensimhon, MD   PRIMARY CARE:  Stan Head. Cleta Alberts, MD over at [facility].   NEUROLOGY:  Marlan Palau, MD   Maureen Maureen [name] is a very pleasant 40 year old African American  female with no known history of coronary artery disease, apparently had  a stress test done in 2004 that was read by [facility].  Maureen Maureen [name] states it was negative.  She reports she was under a lot  of stress since that time and had some chest discomfort.  Thus the  stress Myoview was obtained.  She states the chest discomfort she has  been having recently is not anything like the chest discomfort she had  back then.  This chest discomfort started yesterday morning.  She was  helping her kids get ready for school.  She states she just felt sort of  achy.  She is followed with her fibromyalgia.  Her chest felt sore.  She felt more fatigued than usual and took it easy yesterday, and did  not really do much of anything and then early this morning, she had  sudden onset of substernal chest discomfort.  She states it felt like a  kicking, something was kicking her in her chest.  This lasted for  approximately 2 hours.  It occurred while walking up her steps in her  home.  She states she was talking on her cell phone, she walked up the  steps, became very dyspneic which was unusual for her, and then  experienced the chest discomfort.  She states she broke out in a sweat,  was very diaphoretic and states the back of her neck was wet.  She  checked her sugar, it was 268.  She took some extra insulin.  Her  CBG  has been elevated in the past but has Maureen [name] had symptoms of this nature  associated with high blood sugar.  She states she became a little dizzy,  denied any nausea or vomiting.  Chest discomfort was nonradiating.  Ms.  Caryl Maureen [name] is normally a very active female.  She walks her dog about a  mile every day.  She does her housework.  She does the Wii Fit and  states she Maureen [name] become short of breath or diaphoretic with activities.  She came to the ER here at [facility] around 5 o'clock this morning  after her husband came home from work.  She states her husband told her  that she looked very flushed.  Her chest pain initially was 8 here in  the emergency room.  She relieved with nitroglycerin sublingual x2 which  brought her pain down to a 2 and currently is pain free.   PAST MEDICAL HISTORY:  1. Insulin-dependent diabetes for 25 years.  2. Dyslipidemia for 10 years.  3. Peripheral neuropathy.  4. Migraines.  5. Insomnia.  6. Fibromyalgia.   Maureen Maureen [name] lives here in [place] with her husband.  She  is a stay-at-home mother.  She has 2 children.  Denies any tobacco or  EtOH drug use.  Tries to follow ADA diet.  For exercise, she does Wii  Fit and walks her dog daily.   FAMILY HISTORY:  Positive for CHF in her mother, onset in the 74s,  unclear etiology.  Father alive with history of hypertension.  Her  paternal grandfather died in his 38s secondary to MI.  She has a sibling  with a murmur.   REVIEW OF SYSTEMS:  Positive for sweats, nasal discharge, chest pain,  shortness of breath, dyspnea on exertion as stated above, and general  body aches.   ALLERGIES:  KEFLEX.   MEDICINES:  1. Lipitor 20.  2. Aspirin 81.  3. Sliding scale insulin, regular (NPH insulin 18 units in the a.m.      and 10 units in the evening). 4.  Gabapentin 100 mg at bedtime.  4. Lunesta 3 mg nightly.  5. Propranolol 10 mg daily.   PHYSICAL EXAMINATION:  VITAL SIGNS:  Temperature 97.2,  heart rate 80,  respirations 15, blood pressure 140/75, sat 98% on room air.  GENERAL:  In no acute distress.  HEENT:  Unremarkable.  NECK:  Supple without lymphadenopathy.  No bruits or no negative JVD.  CARDIOVASCULAR:  S1 and S2.  Regular rate and rhythm.  LUNGS:  Clear to auscultation bilaterally.  SKIN:  Warm and dry.  ABDOMEN:  Soft, nontender, positive bowel sounds.  LOWER EXTREMITIES:  Without clubbing, cyanosis, or edema.  NEUROLOGIC:  Alert and oriented x3.   Chest x-ray showing minimal streaky basilar atelectasis without  infiltrates, edema, or effusions.  CT of the chest negative for  pulmonary emboli or acute findings.  EKG, sinus rhythm at a rate of 80  without acute ST or T-wave changes.  I-STAT lab work H&H 15.6 and 46,  potassium 4.3, creatinine 0.8.  D-dimer 0.55.  First point of cares  negative.   IMPRESSION:  Chest pain associated with diaphoresis and dyspnea with  acute onset, relieved with nitroglycerin.  EKG without acute findings.  The patient with insulin-dependent diabetes and dyslipidemia.  The  patient will be admitted for observation, rule out cardiac  catheterization versus stress Myoview.  Options have been discussed with  the patient.  If the patient's cardiac enzymes are positive or she has  recurrent chest pain, we will proceed with cardiac catheterization;  otherwise, we will plan on discharge with outpatient Myoview.  Continue  nitroglycerin  anticoagulate.  Check fasting lipids and a hemoglobin A1c.  Continue  home meds.  Monitor blood pressure and may need to initiate  antihypertensive medication.  Dr. Arvilla Meres has been into examine  and assessed the patient and agrees with the plan of care.      Dorian Pod, ACNP      Bevelyn Buckles. Bensimhon, MD  Electronically Signed    MB/MEDQ  D:  01/04/2008  T:  01/04/2008  Job:  [id number]

## 2010-06-03 NOTE — Discharge Summary (Signed)
NAME:  Maureen [name], Maureen [name]  ACCOUNT NO.:  [id number]   MEDICAL RECORD NO.:  [id number]          PATIENT TYPE:  INP   LOCATION:  4715                         FACILITY:  [facility]   PHYSICIAN:  Bevelyn Buckles. Bensimhon, MDDATE OF BIRTH:  09-Aug-1970   DATE OF ADMISSION:  01/04/2008  DATE OF DISCHARGE:  01/05/2008                               DISCHARGE SUMMARY   PROCEDURE:  CT angiogram of the chest.   PRIMARY FINAL DISCHARGE DIAGNOSIS:  Chest pain, cardiac enzymes negative  for myocardial infarction, CT negative for pulmonary embolism, an  outpatient Myoview planned.   SECONDARY DIAGNOSES:  1. Type 1 diabetes.  2. Hyperlipidemia with a total cholesterol of 250, triglycerides 167,      HDL 55, LDL 162 on this admission.  3. Slightly elevated TSH of 5.454, free T3 and T4 pending at the time      of dictation.  4. Peripheral neuropathy.  5. History of migraines.  6. Insomnia.  7. Fibromyalgia.  8. Allergy or intolerance to [facility].  9. Family history of coronary artery disease in her grandfather at an      young age.   TIME AT DISCHARGE:  Thirty five minutes.   HOSPITAL COURSE:  Maureen [name] is a 40 year old female with no  previous history of coronary artery disease.  She had chest pain and  came to the hospital where she was admitted for further evaluation and  treatment.   Her cardiac enzymes were negative for MI.  Her hemoglobin A1c was 7.8.  Urine pregnancy was negative, and urine drug screen was positive only  for opiates.  A D-dimer was elevated at 0.55, so she had a CT angiogram  of the chest.  It showed no evidence for pulmonary emboli and clear  lungs.   On January 05, 2008, Maureen [name] was evaluated by Dr.  Gala Romney.  He felt that since her cardiac enzymes were negative and her  symptoms had resolved, she could be safely discharged home with  outpatient followup and stress testing.   DISCHARGE INSTRUCTIONS:  1. Her activity level is to be  increased gradually.  2. She is to stick to a low-sodium diabetic diet.  3. She is to follow up with Dr. Gala Romney and with Dr. Cleta Alberts and Dr.      Anne Hahn as well.   DISCHARGE MEDICATIONS:  1. Lipitor 40 mg daily.  2. Aspirin 81 mg daily.  3. Cymbalta 60 mg daily.  4. Neurontin 100 mg a day.  5. Insulin sliding scale, as at home.  6. Phenergan p.r.n.  7. Propranolol 10 mg nightly.  8. NPH insulin 18 units a.m. and 12 units p.m.  9. Lunesta 3 mg nightly.      Theodore Demark, PA-C      Bevelyn Buckles. Bensimhon, MD  Electronically Signed    RB/MEDQ  D:  01/05/2008  T:  01/05/2008  Job:  [id number]   cc:   Brett Canales A. Cleta Alberts, M.D.  Marlan Palau, M.D.

## 2010-06-06 NOTE — Op Note (Signed)
NAME:  Maureen [name], Maureen [name]  ACCOUNT NO.:  [id number]   MEDICAL RECORD NO.:  [id number]          PATIENT TYPE:  OIB   LOCATION:  1006                         FACILITY:  [facility]   PHYSICIAN:  Almedia Balls. Fore, M.D.   DATE OF BIRTH:  1970/09/27   DATE OF PROCEDURE:  03/30/2005  DATE OF DISCHARGE:                                 OPERATIVE REPORT   PREOPERATIVE DIAGNOSES:  Abnormal uterine bleeding, pelvic pain, uterine  enlargement. Slight squamous atypia of cervix.   POSTOPERATIVE DIAGNOSES:  Abnormal uterine bleeding, pelvic pain, uterine  enlargement. Slight squamous atypia of cervix, pending pathology. Extensive  pelvic adhesions, keloid of abdomen.   OPERATION:  Abdominal supracervical hysterectomy with removal of endocervix,  right salpingo-oophorectomy, extensive enterolysis, revision of abdominal  wall keloid scar.   ANESTHESIA:  General orotracheal.   OPERATOR:  Almedia Balls. Randell Patient, M.D.   FIRST ASSISTANT:  Gretta Cool, M.D.   INDICATIONS FOR SURGERY:  The patient is a 40 year old with the above-noted  problems who was fully counseled as to the need for surgery and the nature  of the procedure and the risks involved. She was fully counseled as to the  risks to include anesthesia, injury to bowel, bladder, blood vessels,  ureters, postoperative hemorrhage, infection, recuperation and possible  hormone replacement should her ovaries be removed. She is also diabetic and  has been cautioned as to the need for tight glucose control preoperatively  and postoperatively. She fully understands all these considerations and has  signed informed consent to proceed on March 30, 2005.   OPERATIVE FINDINGS:  On entry into the peritoneal cavity, it was noted that  the uterus was quite adherent to the anterior peritoneal surface and that  the bladder flap was quite adherent and advanced up over the lower uterine  segment. There were adhesions involving omentum and loops of bowel to  the  anterior peritoneal surface as well. After entry into the peritoneum and  lysis of the adhesions on the anterior abdominal wall, it was noted that the  uterus was enlarged and that the ovaries were normal except that the right  ovary had several small cysts present. Upper abdominal viscera were normal  to palpation.   PROCEDURE:  With the patient under general anesthesia, prepared and draped  in the usual sterile fashion, a Foley catheter was placed in the bladder,  and a lower abdominal transverse incision was made after excising a previous  surgical scar which was quite indurated. This incision was carried down to  the peritoneum which was entered just under the upper flap. It was then  possible to gradually dissect off the omental adhesions from the anterior  peritoneal surface and to free the loops of bowel as well. The uterus was  then elevated out of the incision so that the correct dissection planes  could be ascertained. Sharp and blunt dissection were used as well as Bovie  dissection were used to free up the uterus from the anterior peritoneal  surface initially. The round ligaments were then suture ligated and divided  using Bovie electrocoagulation. Further lysis of the adhesion from the  uterus to the anterior peritoneal surface and bladder  was then accomplished  using sharp dissection. Care was taken to avoid injury to the bladder. It  was impossible to place self-retaining retractor at which time the bowel was  packed off as well. Kelly clamps were used to clamp the uterine ovarian  anastomoses and tubes bilaterally for traction and hemostasis. The utero-  ovarian ligaments were then clamped, cut free, and doubly ligated with #1  chromic catgut. It was then possible to elevate the uterus well out of the  pelvis; the uterine vessels bilaterally were then skeletonized, clamped,  cut, and suture ligated with #0 Vicryl. Cardinal ligaments bilaterally were  then clamped,  cut, and suture ligated with #0 Vicryl. It was impossible to  core out the endocervix all the way to the exocervix leaving just a rim of  fascial tissue behind for support. This area of cervical tissue was then  reapproximated and rendered hemostatic with interrupted figure-of-eight  sutures of #0 Vicryl. The area was lavaged with copious amounts of lactated  Ringer solution, and it was noted that there was hemorrhage from the  infundibulopelvic ligament on the right. Several attempts were made  unsuccessfully to secure the vessels in this area; because of the inability  to adequately secure the vessels to ensure that the pedicle would be a  viable, Heaney clamp was placed across the infundibulopelvic ligament on the  right with excision of the right tube and ovary. Right infundibulopelvic  ligament was then doubly tied with #0 Vicryl. The surgical area in the  pelvis was reperitonealized with a continuous suture of 3-0 PDS with  elevation of the left tube and ovary out of the pelvic floor. After noting  that hemostasis was maintained and that sponge and instrument counts were  correct, the peritoneum was closed in the lower pole initially because of  the irregularity and difficulty in reapproximating the peritoneum in this  area. The upper portion of the peritoneal incision was then closed as was  the lower one with a continuous suture of #0 Vicryl each tied separately at  the mid portion. The fascia was then reapproximated with two sutures of #0  PDS which were tied together in the midline. The subcutaneous fat was  reapproximated with interrupted sutures of #0 Vicryl. The skin was closed  with subcuticular suture of 3-0 plain catgut. Each layer was lavaged with  copious amounts of lactated Ringer's solution prior to full closure.  Estimated blood loss 600 mL. The patient was taken to the recovery room in good condition with clear urine and a Foley catheter tubing. She will be  admitted  as an outpatient with extended recovery following surgery.           ______________________________  Almedia Balls Randell Patient, M.D.     SRF/MEDQ  D:  03/30/2005  T:  03/31/2005  Job:  [id number]   cc:   Gretta Cool, M.D.  Fax: [phone]

## 2010-06-06 NOTE — Discharge Summary (Signed)
NAMEROBYN, GALATI NO.:  [id number]   MEDICAL RECORD NO.:  [id number]          PATIENT TYPE:  INP   LOCATION:  2036                         FACILITY:  [facility]   PHYSICIAN:  Chauncey Reading, D.O.  DATE OF BIRTH:  09-Mar-1970   DATE OF ADMISSION:  DATE OF DISCHARGE:  12/15/2005                               DISCHARGE SUMMARY   DISCHARGE DIAGNOSES:  1. Atypical chest pain.  2. Type 1 diabetes mellitus x23 years, well controlled, on insulin      pump with Humalog.  3. Proteinuria.  4. Macular swelling, status post laser eye surgery, October 2007.  5. Dyslipidemia.  6. History of headaches, followed by neurologist in past.  7. Status post hysterectomy and right oophorectomy, March 2007,      secondary to endometriosis and fibroids.  8. Status post tonsillectomy, September 2007.  9. Status post cesarean section x2.   DISCHARGE MEDICATIONS:  1. Insulin pump Humalog.  Continue with home regimen.  2. Enalapril 10 mg by mouth once a day.  3. Lipitor 20 mg by mouth once a day.  4. Aspirin 81 mg by mouth once a day.  5. Aleve 200-mg tablets, take 1-2 tablets every 12 hours as needed for      pain.  6. Over-the-counter H2 blocker.   DISPOSITION AND FOLLOWUP:  Maureen [name] was discharged after  overnight observation in the telemetry unit with negative serial cardiac  enzymes and normal serial EKGs.  She saw Dr. Alanda Amass, who will be  contacting with an appointment for further cardiac testing.  As the pain  had resolved, patient was not discharged on a proton pump inhibitor or  an H2 blocker; however, patient has been informed that if chest pain  persists, she should start taking one of these medications over-the-  counter.  If it is persistent then after that, recommend considering an  endoscopy procedure to evaluate for possible peptic ulcer disease.  She  also has a followup appointment on December 4th at 8 a.m. with her  primary care physician, Dr.  Andi Devon, at [facility].  Her blood glucoses  have been variable during hospitalization with highs in the 300s and low  of 58.  Patient attributes this variability to her change from her home  diet, and as she is very adept at her diabetic management, will not  change her insulin regimen for now.  Patient will follow up with primary  Yosef Krogh if her elevated glucose persists.  Her LDL was tested during  hospitalization and was found to be 74 which is near goal for a diabetic  woman.  Therefore, will continue patient's current dose of Lipitor 20  mg, but her fasting lipid levels should be rechecked in about 6 months.   PROCEDURES PERFORMED:  1. Lower extremity Dopplers demonstrated no evidence of deep vein      thrombosis, SVT, Baker's cyst of the right lower extremity veins.  2. Cardiac echo demonstrated overall left ventricular systolic      function was normal, and left ventricular ejection fraction was      estimated to be 55%.  Left ventricular diastolic function      parameters __________ , with  no evidence for a mitral valve      prolapse.  Overall impression was normal echocardiographic study.   CONSULTATIONS:  Susa Griffins, cardiology.   ADMITTING HISTORY AND PHYSICAL:  This was a 40 year old with type 2  diabetes x3 years with evidence of end-organ damage, dyslipidemia, and a  strong family history of heart disease including mother with myocardial  infarction at age 65 and grandfather with myocardial infarction and  death at age 15.  She presented with new-onset chest pain of 2 days'  duration.  She also endorsed mild shortness of breath and some radiation  of pain to her shoulders and lower back.  She denied associated symptoms  but did note that she had been having decrease in appetite and nausea  for the past few weeks.   PHYSICAL EXAMINATION:  VITAL SIGNS:  Vitals in the emergency department  were:  Temperature 97.8, blood pressure 118/70, pulse 82, respiratory  rate  19, O2 sat 99% on room air.  Blood pressure in right arm was  rechecked to be 139/75 and in the left arm, 127/76.  GENERAL:  The patient is a young-appearing African American woman in no  apparent distress sitting comfortably on the edge of the bed.  HEENT:  Pupils equal, round, reactive to light.  Extraocular movements  intact.  Oropharynx is clear.  NECK:  Supple with no JVD.  LUNGS:  Respirations demonstrated good air movement.  Lungs were clear  to auscultation bilaterally.  CARDIOVASCULAR EXAM:  Reveals regular rate and rhythm with normal S1 and  physiologic splitting S2, with no murmurs, rubs, gallops.  GI EXAM:  Soft, nontender.  Normal bowel sounds.  Negative Murphy sign.  RECTAL:  Unremarkable rectal exam with negative Hemoccult.  EXTREMITIES:  The patient's right lower calf was 2 cm larger in diameter  than her left calf, and she complained of some pain in that area.  However, there was no peripheral edema nor erythema over the right calf.  SKIN:  Warm and dry, with no lesions.  MENTAL STATUS:  Normal, with appropriate affect.   ADMISSION LABORATORY DATA:  Sodium 139, potassium 3.8, chloride 107,  bicarb 27, BUN 7, creatinine 0.9, glucose 118.  White blood cell count  10.9, hemoglobin 14, hematocrit 41, platelets 366.  AST 7.  MCV 93.  Bilirubin 0.5, alk phos 100, AST 21, ALT 24, protein 7.4, albumin 3.6,  calcium 9.3.   Chest x-ray demonstrated mild bibasilar atelectasis but no acute  disease.  Patient's point-of-care enzymes in the emergency department  were within normal limits.   HOSPITAL COURSE:  1. Chest pain:  Although the patient was young and had atypical chest      pain, it is still worrisome for acute coronary syndrome in a      diabetic young female patient.  Therefore, serial EKGs, serial      cardiac enzymes, telemetry monitoring, and cardiography were      performed, all of which were negative.  Cardiology was consulted,     and they agreed with the plan.   Patient was given metoprolol 25      b.i.d., subcu Lovenox b.i.d., and 1 aspirin of 325 mg.  Patient's      Lipitor was increased to 80 mg.  Given patient's complaint of right      lower extremity pain and mild swelling, D-dimers and peripheral      venous Dopplers were performed to help rule out pulmonary embolism,      both  of which were normal.  The patient's chest x-ray showed no      signs of pneumonia.  Patient was given Protonix in the emergency      department if the pain was due to  peptic ulcer disease or GERD.      Patient was also given NSAIDs to help with pain.  Patient's pain      had resolved by the morning, and given negative results on all      tests, she was discharged with followup with cardiology and her      PCP.  2. Type 1 diabetes mellitus:  Patient's blood glucose was normal on      admission but showed some elevation, up to 300, and depressions      found to 50s during her hospitalization.  Patient states that the      hospitalization caused a change in her eating habits, and she      attributes the variation to this.  The patient showed no other      signs or symptoms of difficulty with her diabetes and has been sent      home on her previous home regimen.  Patient will follow up with her      primary care physician for further monitoring.  3. Dyslipidemia:  Patient's LDL was near goal of 74 for a patient with      diabetes with her recently increased dose of Lipitor 20 mg.      Patient will continue with her home dose and will follow up to      monitor this.  4. Right calf pain:  Resolved with bed rest and pain medication.      Given normal lower extremity Dopplers and D-dimer, this was felt to      be musculoskeletal pain requiring no further action at this time.   DISCHARGE LABORATORIES:  A CBG of 331.  CK total 64, CPK-MB 0.6,  troponin 0.02.  Urinalysis:  CD urine with urine glucose of greater than  1000 mg/dL, trace blood, 30 mg/dL protein, with many  squamous epithelial  cells, a few bacteria.  CBC:  White blood count 10.9, hemoglobin 12.7,  hematocrit 37.4, platelets 354.  PT 13.3, INR 1.0.  Chemistries:  Sodium  136, potassium 4.1, chloride 104, bicarb 26, BUN 9, creatinine 0.9,  glucose 307.  Calcium 8.7.  Cholesterol 140, triglycerides 63, HDL 53,  LDL 74, VLDL 13.     ______________________________  Emilie Rutter, MS-IV    ______________________________  Chauncey Reading, D.O.    MI/MEDQ  D:  12/15/2005  T:  12/16/2005  Job:  [id number]   cc:   Gerlene Burdock A. Alanda Amass, M.D.  Gabriel Earing, M.D.

## 2010-06-06 NOTE — H&P (Signed)
NAME:  Maureen [name], Maureen [name]  ACCOUNT NO.:  [id number]   MEDICAL RECORD NO.:  [id number]           PATIENT TYPE:   LOCATION:                                 FACILITY:   PHYSICIAN:  Almedia Balls. Fore, M.D.   DATE OF BIRTH:  1970/04/30   DATE OF ADMISSION:  03/30/2005  DATE OF DISCHARGE:                                HISTORY & PHYSICAL   HISTORY OF PRESENT ILLNESS:  The patient is a 40 year old gravida 6, para 2  with known fibroid tumors and dysplasia who has been advised that she  undergo a hysterectomy by Dr. Chevis Pretty. She was referred to [place] by him for this  procedure, and we concur that she does need this surgery. Her last menstrual  period was March 19, 2005. 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 She has been fully counseled as to the nature of the  procedure and the risks involved including risks of anesthesia, injury to  bowel, bladder, blood vessels, ureters, postoperative hemorrhage, infection,  recuperation, possible hormone replacement should her ovaries be removed.  She fully understands all these considerations and wishes to proceed on  March 30, 2005.   PAST MEDICAL HISTORY:  History of and current diabetes mellitus for which  she uses an insulin pump. Her latest hemoglobin A1C was 6.9. She also has  persisting proteinuria and takes enalapril for this problem.   ALLERGIES:  KEFLEX.   PAST SURGICAL HISTORY:  She underwent a C-section in 1997 and 1999, removal  of fatty tumor in 2002.   FAMILY HISTORY:  Include brother with diabetes mellitus. Mother, father,  grandparents with cardiovascular disease. Father with cancer of unknown  type.   REVIEW OF SYSTEMS:  HEENT: Headaches. CARDIORESPIRATORY: Negative.  GASTROINTESTINAL: Constipation. GENITOURINARY: As in present illness.  NEUROMUSCULAR:  Negative.   PHYSICAL EXAMINATION:  VITAL SIGNS:  Height 5 feet, 3-1/2 inches tall,  weight 180, blood pressure 110/60, pulse 84, respirations 18.  GENERAL: Well-developed black female in no acute distress.  HEENT: Within normal limits.  NECK: Supple without masses, adenopathy or bruits.  HEART: Regular rate and rhythm without murmurs.  LUNGS: Clear to P&A.  BREASTS:  Examined sitting and lying without mass.  ABDOMEN: Flat and soft without mass or tenderness.  PELVIC: External genitalia, Bartholin's, urethra and Skene's glands within  normal limits. Cervix is slightly inflamed and status post biopsy which is  healing well. Uterus is mid position, approximately 14-[redacted] weeks gestational  size, irregular, somewhat tender. There are no palpable adnexal masses.  EXTREMITIES: Within normal limits.  CENTRAL NERVOUS SYSTEM: Grossly intact.  SKIN: Without suspicious lesions.   IMPRESSION:  Abnormal uterine bleeding, uterine enlargement, probable  fibroids, dysplasia of cervix.   DISPOSITION:  As noted above.           ______________________________  Almedia Balls. Randell Patient, M.D.     SRF/MEDQ  D:  03/23/2005  T:  03/24/2005  Job:  [id number]

## 2010-06-06 NOTE — Op Note (Signed)
NAME:  Maureen [name], Maureen [name]  ACCOUNT NO.:  [id number]   MEDICAL RECORD NO.:  [id number]          PATIENT TYPE:  OIB   LOCATION:  2550                         FACILITY:  [facility]   PHYSICIAN:  Jefry H. Pollyann Kennedy, MD     DATE OF BIRTH:  [birth year]   DATE OF PROCEDURE:  10/16/2005  DATE OF DISCHARGE:                                 OPERATIVE REPORT   PREOPERATIVE DIAGNOSIS:  Chronic tonsillitis.   POSTOPERATIVE DIAGNOSIS:  Chronic tonsillitis.   PROCEDURE:  Tonsillectomy.   SURGEON:  Jefry H. Pollyann Kennedy, MD.   ANESTHESIA:  General endotracheal anesthesia was used.   COMPLICATIONS:  None.   BLOOD LOSS:  None.   FINDINGS:  Moderately to severely enlarged tonsils with deep cryptic spaces,  cryptic debris and microabscess seen on the left side superiorly.   HISTORY:  A 40 year old with a history of chronic and recurring  tonsillopharyngitis.  The risks, benefits, alternatives and complications of  the procedure were explained to the patient, who seemed to understand and  agreed to surgery.   DESCRIPTION OF PROCEDURE:  The patient was taken to the operating room,  placed on the operating table in the supine position.  Following induction  of general endotracheal anesthesia, the table was turned and the patient was  draped in the standard fashion.  A Crowe-Davis mouth gag was inserted into  the oral cavity and used to retract the tongue and mandible and attach the  Mayo stand.  Inspection of the palate revealed no evidence of a submucous  cleft or shortening of the soft palate.  A red rubber catheter was inserted  through the right side of the nose, which was brought out through the mouth  and used to retract the soft palate and uvula.  Tonsillectomy was performed  using electrocautery dissection bilaterally, carefully dissecting the  relatively avascular plane between the capsule  and the constrictor muscles.  The tonsils were sent together for pathologic  evaluation.  The pharynx was  suctioned of blood and secretions, irrigated  with saline solution.  An orogastric tube was used to aspirate the contents  of the stomach.  The patient was then awakened, extubated and transferred to  recovery in stable condition.      Jefry H. Pollyann Kennedy, MD  Electronically Signed     JHR/MEDQ  D:  10/16/2005  T:  10/16/2005  Job:  [id number]   cc:   Gabriel Earing, M.D.

## 2010-06-06 NOTE — Discharge Summary (Signed)
NAME:  Maureen [name], Maureen [name]  ACCOUNT NO.:  [id number]   MEDICAL RECORD NO.:  [id number]          PATIENT TYPE:  OIB   LOCATION:  1006                         FACILITY:  [facility]   PHYSICIAN:  Almedia Balls. Fore, M.D.   DATE OF BIRTH:  January 24, 1970   DATE OF ADMISSION:  03/30/2005  DATE OF DISCHARGE:  03/31/2005                                 DISCHARGE SUMMARY   HISTORY:  The patient is a 40 year old with abnormal uterine bleeding,  uterine enlargement and pelvic pain for a hysterectomy and possible  bilateral salpingo-oophorectomy on March 30, 2005.  She also has diabetes,  type 1, for which she uses an insulin pump, and continuing proteinuria, for  which she takes enalapril.   LABORATORY DATA:  Include preoperative hemoglobin of 13.3, 11,800 white  blood cells with a normal differential.  Urinalysis showed glucose at 100  mg/dL, protein greater than 213 mg/dL.  BMET panel showed glucose elevated  at 154.  Chest x-ray, CT scan and electrocardiogram were normal.   HOSPITAL COURSE:  The patient was taken to the operating room on 12 March,  2007, at which time abdominal supracervical hysterectomy, extensive  enterolysis, right salpingo-oophorectomy and revision of abdominal wall  keloid were performed.  The patient did well postoperatively.  Diet and  ambulation were progressed over the evening of 12 March and early morning of  13 March.  On the morning of 13 March, she was afebrile and experiencing no  problems, and it was felt that she could be discharged at this time.   FINAL DIAGNOSES:  1.  Abnormal uterine bleeding.  2.  Pelvic pain.  3.  Uterine enlargement.  4.  Pelvic adhesions.  5.  Abdominal wall keloid.  6.  Diabetes mellitus.  7.  Proteinuria.   OPERATION:  Abdominal supracervical hysterectomy, right salpingo-  oophorectomy, extensive enterolysis and revision of abdominal wall keloid.  Pathology report unavailable at the time of dictation.   DISPOSITION:  Discharged  home to return to the office in two weeks for  followup.  She was instructed to gradually progress her activities over  several weeks at home and to limit lifting and driving for two weeks.  She  was  fully ambulatory, on a regular low-glycemic diet and in good condition at  the time of discharge.  She was given a prescription for Percocet 10/325 mg,  #30, to be taken 1 q.6h. p.r.n. pain and doxycycline 100 mg, #12, to be  taken 1 b.i.d..           ______________________________  Almedia Balls Randell Patient, M.D.     SRF/MEDQ  D:  03/31/2005  T:  04/01/2005  Job:  [id number]   cc:   Leatha Gilding. Mezer, M.D.  Fax: [phone]

## 2010-06-06 NOTE — Op Note (Signed)
[place]  Patient:    Maureen [name], Maureen [name] Visit Number: [id number] MRN: [id number]          Service Type: DSU Location: [facility] Attending Physician:  Caleen Essex Dictated by:   Chevis Pretty, M.D. Proc. Date: 04/26/01 Admit Date:  04/25/2001 Discharge Date: 04/25/2001                             Operative Report  PREOPERATIVE DIAGNOSIS:  Lipoma on the back.  POSTOPERATIVE DIAGNOSIS:  Lipoma on the back.  OPERATION PERFORMED:  Excision of a 4 cm lipoma of the back.  SURGEON:  Chevis Pretty, M.D.  ANESTHESIA:  General endotracheal.  DESCRIPTION OF PROCEDURE:  After informed consent was obtained, the patient was brought to the operating room and left in the supine position on the hospital bed.  After adequate induction of general anesthesia, the patient was flipped into prone position and all pressure points were padded.  The patients back was then prepped with Betadine and draped in the usual sterile manner.  A small transverse incision was made overlying the mass in question with a 15 blade knife.  This incision was carried down through the skin and subcutaneous tissues using a Bovie electrocautery.  A combination of blunt finger dissection and sharp dissection with the Bovie electrocautery was then carried out until the mass in question was freed from the rest of the subcutaneous tissues and was sent to pathology.  The wound was then examined and found to be hemostatic.  The incision was then closed, the deep layer with interrupted 3-0 Vicryl and the skin was closed with a running 4-0 Monocryl subcuticular stitch.  Benzoin and Steri-Strips were applied.  The patient tolerated the procedure well.  At the end of the case all sponge, needle and instrument counts were correct.  The patient was then placed back in supine position and awakened without difficulty and taken to recovery room in stable condition. Dictated by:   Chevis Pretty,  M.D. Attending Physician:  Caleen Essex DD:  04/26/01 TD:  04/27/01 Job: [id number]

## 2010-10-10 LAB — CBC
HCT: 48.5 — ABNORMAL HIGH
Hemoglobin: 16.4 — ABNORMAL HIGH
MCHC: 33.8
MCV: 96.7
Platelets: 449 — ABNORMAL HIGH
RBC: 5.02
RDW: 12.9
WBC: 14.6 — ABNORMAL HIGH

## 2010-10-10 LAB — DIFFERENTIAL
Basophils Absolute: 0
Basophils Relative: 0
Eosinophils Absolute: 0.1
Eosinophils Relative: 1
Lymphocytes Relative: 15
Lymphs Abs: 2.1
Monocytes Absolute: 0.3
Monocytes Relative: 2 — ABNORMAL LOW
Neutro Abs: 12.1 — ABNORMAL HIGH
Neutrophils Relative %: 83 — ABNORMAL HIGH

## 2010-10-13 LAB — I-STAT 8, (EC8 V) (CONVERTED LAB)
Acid-Base Excess: 2
BUN: 7
Bicarbonate: 27.2 — ABNORMAL HIGH
Chloride: 104
Glucose, Bld: 280 — ABNORMAL HIGH
HCT: 50 — ABNORMAL HIGH
Hemoglobin: 17 — ABNORMAL HIGH
Operator id: 198171
Potassium: 4.1
Sodium: 136
TCO2: 29
pCO2, Ven: 44.8 — ABNORMAL LOW
pH, Ven: 7.391 — ABNORMAL HIGH

## 2010-10-13 LAB — POCT PREGNANCY, URINE
Operator id: 198171
Preg Test, Ur: NEGATIVE

## 2010-10-13 LAB — URINALYSIS, ROUTINE W REFLEX MICROSCOPIC
Bilirubin Urine: NEGATIVE
Glucose, UA: >1000 — AB
Ketones, ur: NEGATIVE
Leukocytes, UA: NEGATIVE
Nitrite: NEGATIVE
Protein, ur: 100 — AB
Specific Gravity, Urine: 1.03
Urobilinogen, UA: 0.2
pH: 5

## 2010-10-13 LAB — URINE MICROSCOPIC-ADD ON

## 2010-10-13 LAB — POCT I-STAT CREATININE
Creatinine, Ser: 0.9
Operator id: 198171

## 2010-10-21 IMAGING — CR DG ABDOMEN ACUTE W/ 1V CHEST
3 series · 3 of 3 positions shown · non-contrast
Comparison: CT of the abdomen and pelvis performed 03/07/2007

CLINICAL DATA: Gradual onset of right-sided flank and back pain for
3 days; nausea, vomiting and diarrhea.

ACUTE ABDOMEN SERIES (ABDOMEN 2 VIEW & CHEST 1 VIEW)

[view not recorded (1 of 3)]
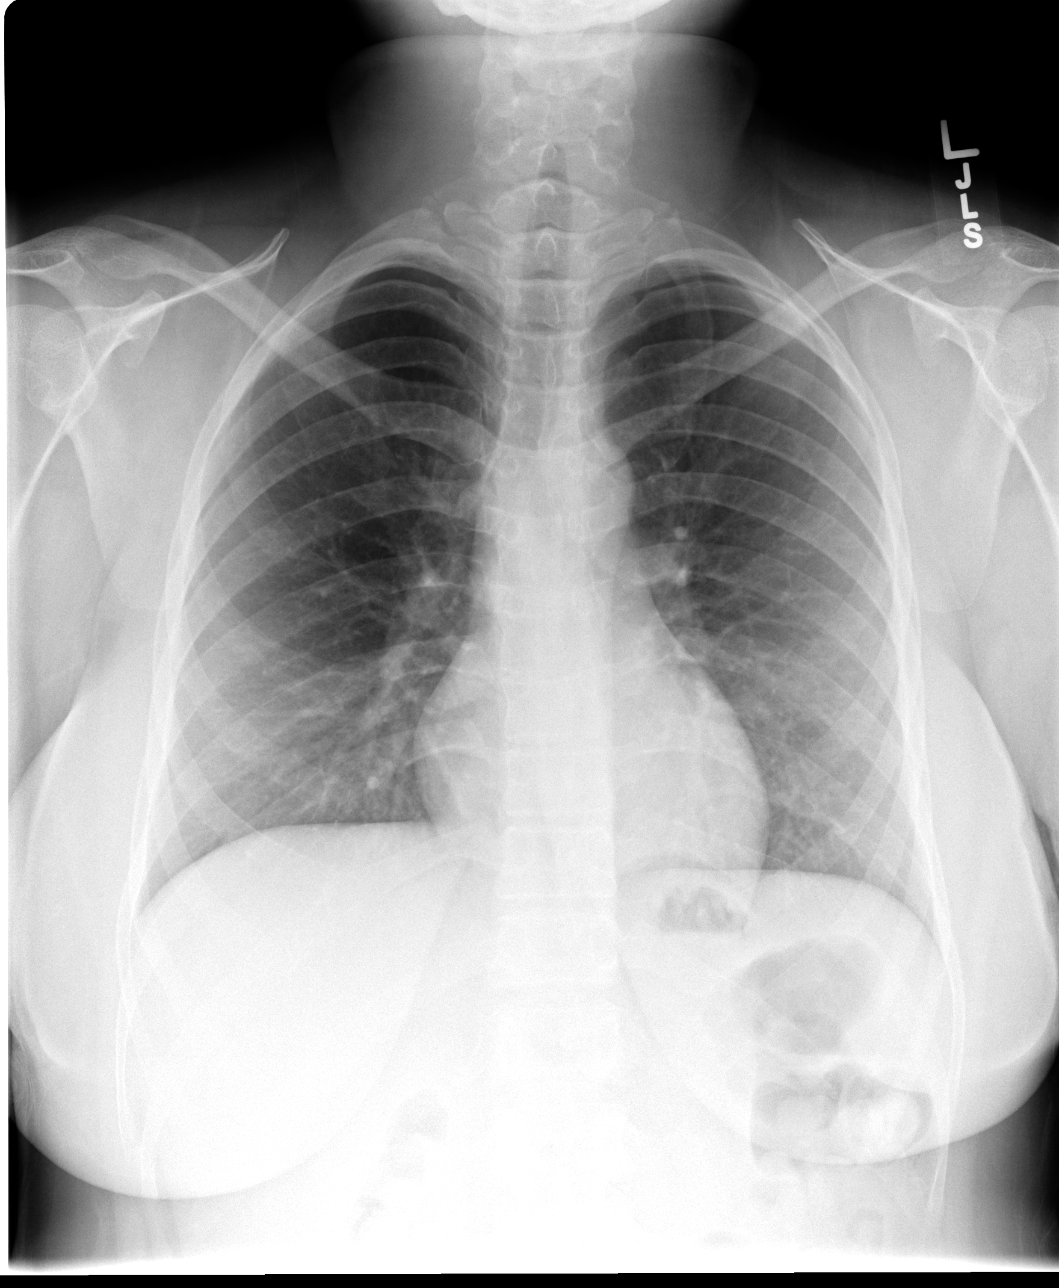

[view not recorded (2 of 3)]
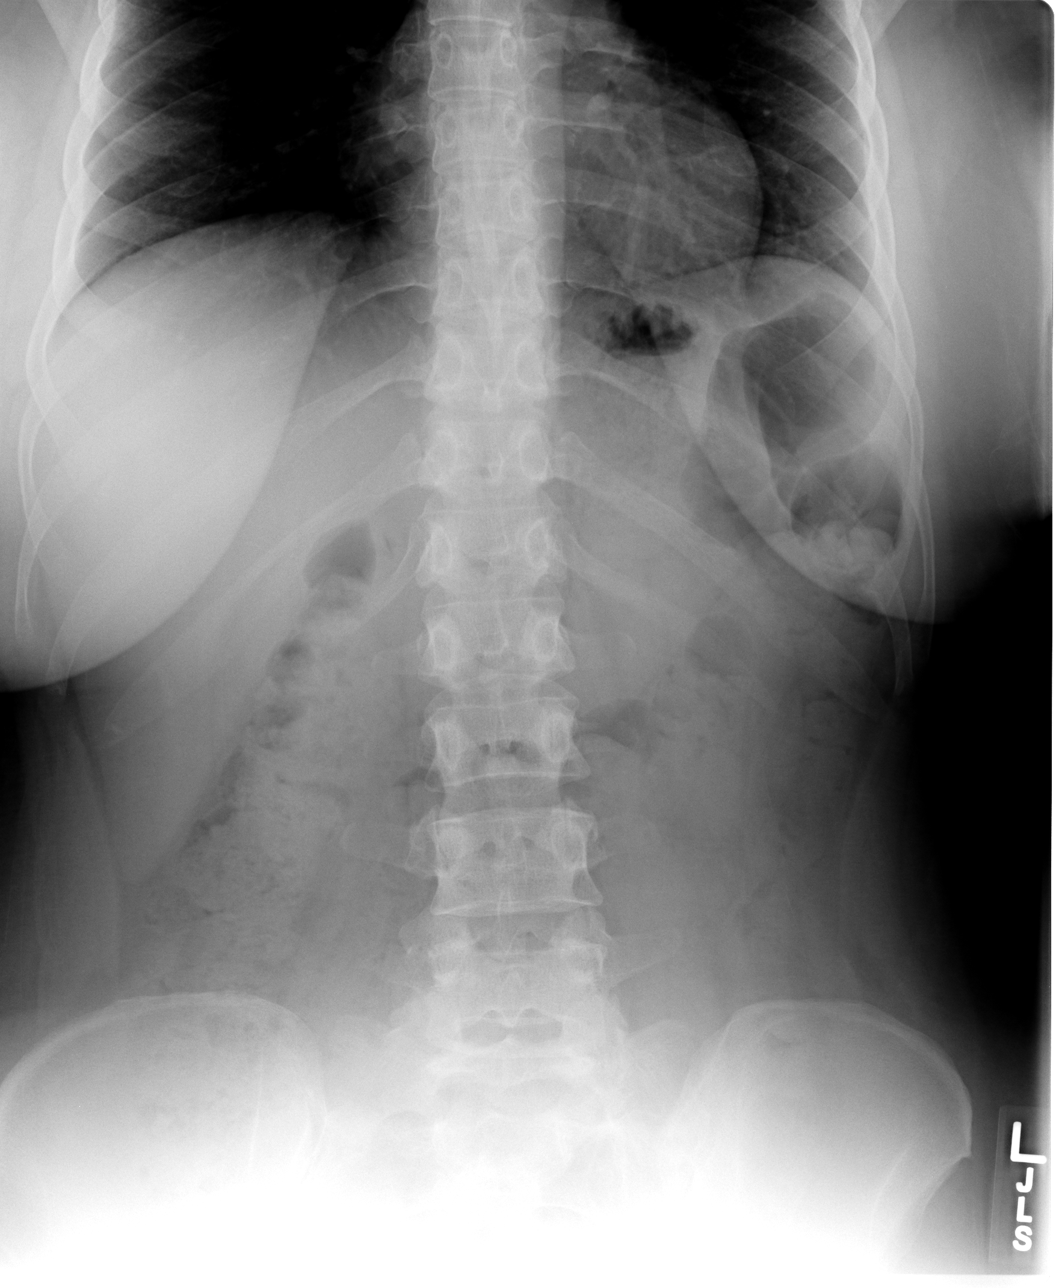

[view not recorded (3 of 3)]
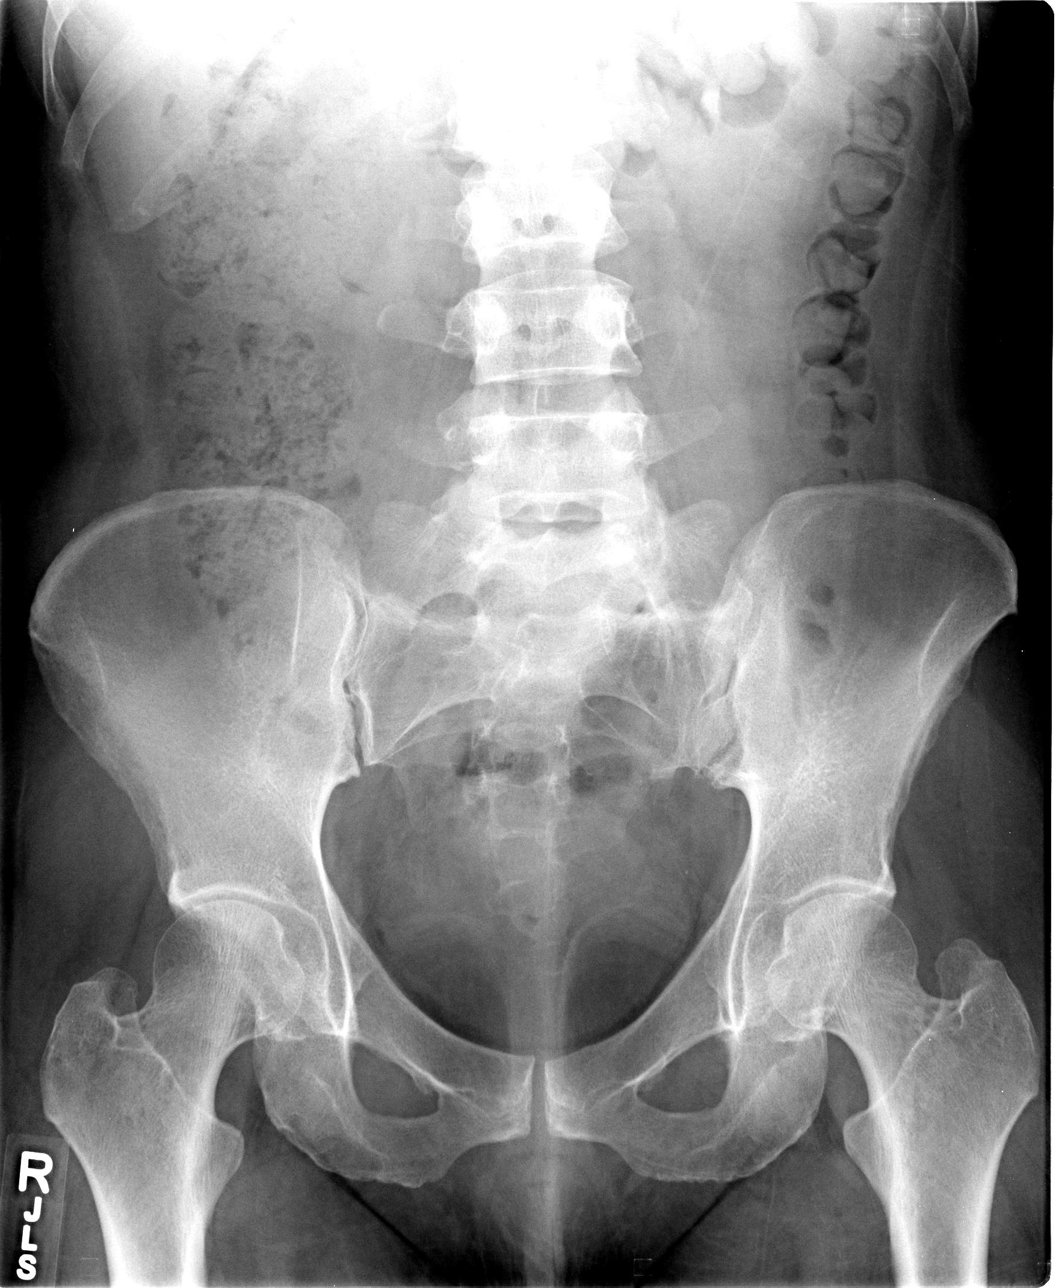

[3 of 3 positions shown; findings below may reference images not displayed]

FINDINGS: The lungs are well-aerated and clear.  There is no
evidence of focal opacification, pleural effusion or pneumothorax.
The cardiomediastinal silhouette is within normal limits.

The visualized bowel gas pattern is unremarkable. Stool and air are
noted throughout the colon; there is no evidence of small bowel
dilatation to suggest obstruction.  No free intra-abdominal air is
identified on the provided upright view.

No acute osseous abnormalities are seen; the sacroiliac joints are
unremarkable in appearance. There is partial sacralization of L5,
relatively symmetric in appearance.
IMPRESSION: 1.  Unremarkable bowel gas pattern; no free intra-abdominal air
seen.
2.  No acute cardiopulmonary process identified.

## 2010-10-24 LAB — POCT I-STAT, CHEM 8
BUN: 13 mg/dL (ref 6–23)
Calcium, Ion: 1.15 mmol/L (ref 1.12–1.32)
Chloride: 105 mEq/L (ref 96–112)
Creatinine, Ser: 0.8 mg/dL (ref 0.4–1.2)
Glucose, Bld: 200 mg/dL — ABNORMAL HIGH (ref 70–99)
HCT: 46 % (ref 36.0–46.0)
Hemoglobin: 15.6 g/dL — ABNORMAL HIGH (ref 12.0–15.0)
Potassium: 4.3 mEq/L (ref 3.5–5.1)
Sodium: 140 mEq/L (ref 135–145)
TCO2: 29 mmol/L (ref 0–100)

## 2010-10-24 LAB — HEPARIN LEVEL (UNFRACTIONATED)
Heparin Unfractionated: 0.58 IU/mL (ref 0.30–0.70)
Heparin Unfractionated: 0.87 IU/mL — ABNORMAL HIGH (ref 0.30–0.70)

## 2010-10-24 LAB — LIPID PANEL
Cholesterol: 250 mg/dL — ABNORMAL HIGH (ref 0–200)
HDL: 55 mg/dL (ref >39)
LDL Cholesterol: 162 mg/dL — ABNORMAL HIGH (ref 0–99)
Total CHOL/HDL Ratio: 4.5 RATIO
Triglycerides: 167 mg/dL — ABNORMAL HIGH (ref <150)
VLDL: 33 mg/dL (ref 0–40)

## 2010-10-24 LAB — CBC
HCT: 41.4 % (ref 36.0–46.0)
HCT: 42.9 % (ref 36.0–46.0)
Hemoglobin: 13.8 g/dL (ref 12.0–15.0)
Hemoglobin: 14.3 g/dL (ref 12.0–15.0)
MCHC: 33.2 g/dL (ref 30.0–36.0)
MCHC: 33.3 g/dL (ref 30.0–36.0)
MCV: 96.7 fL (ref 78.0–100.0)
MCV: 98.1 fL (ref 78.0–100.0)
Platelets: 354 10*3/uL (ref 150–400)
Platelets: 392 10*3/uL (ref 150–400)
RBC: 4.22 MIL/uL (ref 3.87–5.11)
RBC: 4.44 MIL/uL (ref 3.87–5.11)
RDW: 12.4 % (ref 11.5–15.5)
RDW: 12.8 % (ref 11.5–15.5)
WBC: 12.1 10*3/uL — ABNORMAL HIGH (ref 4.0–10.5)
WBC: 13.9 10*3/uL — ABNORMAL HIGH (ref 4.0–10.5)

## 2010-10-24 LAB — D-DIMER, QUANTITATIVE: D-Dimer, Quant: 0.55 ug/mL-FEU — ABNORMAL HIGH (ref 0.00–0.48)

## 2010-10-24 LAB — RAPID URINE DRUG SCREEN, HOSP PERFORMED
Amphetamines: NOT DETECTED
Barbiturates: NOT DETECTED
Benzodiazepines: NOT DETECTED
Cocaine: NOT DETECTED
Opiates: POSITIVE — AB
Tetrahydrocannabinol: NOT DETECTED

## 2010-10-24 LAB — HEMOGLOBIN A1C
Hgb A1c MFr Bld: 7.8 % — ABNORMAL HIGH (ref 4.6–6.1)
Mean Plasma Glucose: 177 mg/dL

## 2010-10-24 LAB — CARDIAC PANEL(CRET KIN+CKTOT+MB+TROPI)
CK, MB: 0.5 ng/mL (ref 0.3–4.0)
CK, MB: 0.5 ng/mL (ref 0.3–4.0)
Relative Index: INVALID (ref 0.0–2.5)
Relative Index: INVALID (ref 0.0–2.5)
Total CK: 49 U/L (ref 7–177)
Total CK: 50 U/L (ref 7–177)
Troponin I: <0.01 ng/mL (ref 0.00–0.06)
Troponin I: <0.01 ng/mL (ref 0.00–0.06)

## 2010-10-24 LAB — POCT CARDIAC MARKERS
CKMB, poc: <1 ng/mL — ABNORMAL LOW (ref 1.0–8.0)
Myoglobin, poc: 61.7 ng/mL (ref 12–200)
Troponin i, poc: <0.05 ng/mL (ref 0.00–0.09)

## 2010-10-24 LAB — GLUCOSE, CAPILLARY
Glucose-Capillary: 227 mg/dL — ABNORMAL HIGH (ref 70–99)
Glucose-Capillary: 264 mg/dL — ABNORMAL HIGH (ref 70–99)
Glucose-Capillary: 364 mg/dL — ABNORMAL HIGH (ref 70–99)

## 2010-10-24 LAB — COMPREHENSIVE METABOLIC PANEL
ALT: 16 U/L (ref 0–35)
AST: 25 U/L (ref 0–37)
Albumin: 3.1 g/dL — ABNORMAL LOW (ref 3.5–5.2)
Alkaline Phosphatase: 117 U/L (ref 39–117)
BUN: 11 mg/dL (ref 6–23)
CO2: 26 mEq/L (ref 19–32)
Calcium: 9.1 mg/dL (ref 8.4–10.5)
Chloride: 104 mEq/L (ref 96–112)
Creatinine, Ser: 0.85 mg/dL (ref 0.4–1.2)
GFR calc Af Amer: >60 mL/min (ref >60)
GFR calc non Af Amer: >60 mL/min (ref >60)
Glucose, Bld: 102 mg/dL — ABNORMAL HIGH (ref 70–99)
Potassium: 4.1 mEq/L (ref 3.5–5.1)
Sodium: 138 mEq/L (ref 135–145)
Total Bilirubin: 0.5 mg/dL (ref 0.3–1.2)
Total Protein: 6.5 g/dL (ref 6.0–8.3)

## 2010-10-24 LAB — CK TOTAL AND CKMB (NOT AT ARMC)
CK, MB: 0.5 ng/mL (ref 0.3–4.0)
Relative Index: INVALID (ref 0.0–2.5)
Total CK: 66 U/L (ref 7–177)

## 2010-10-24 LAB — TSH: TSH: 5.454 u[IU]/mL — ABNORMAL HIGH (ref 0.350–4.500)

## 2010-10-24 LAB — T3, FREE: T3, Free: 2.8 pg/mL (ref 2.3–4.2)

## 2010-10-24 LAB — APTT: aPTT: 26 seconds (ref 24–37)

## 2010-10-24 LAB — POCT PREGNANCY, URINE: Preg Test, Ur: NEGATIVE

## 2010-10-24 LAB — TROPONIN I: Troponin I: <0.01 ng/mL (ref 0.00–0.06)

## 2010-10-24 LAB — PROTIME-INR
INR: 0.8 (ref 0.00–1.49)
Prothrombin Time: 11.3 seconds — ABNORMAL LOW (ref 11.6–15.2)

## 2010-10-24 LAB — T4, FREE: Free T4: 1.01 ng/dL (ref 0.89–1.80)

## 2010-11-04 LAB — I-STAT 8, (EC8 V) (CONVERTED LAB)
Acid-Base Excess: 5 — ABNORMAL HIGH
BUN: 12
Bicarbonate: 25.6 — ABNORMAL HIGH
Chloride: 102
Glucose, Bld: 270 — ABNORMAL HIGH
HCT: 44
Hemoglobin: 15
Operator id: 284251
Potassium: 4.8
Sodium: 132 — ABNORMAL LOW
TCO2: 26
pCO2, Ven: 25.6 — ABNORMAL LOW
pH, Ven: 7.607

## 2010-11-04 LAB — CBC
HCT: 42.5
Hemoglobin: 14.2
MCHC: 33.5
MCV: 96.6
Platelets: 383
RBC: 4.41
RDW: 12.5
WBC: 13.4 — ABNORMAL HIGH

## 2010-11-04 LAB — DIFFERENTIAL
Basophils Absolute: 0
Basophils Relative: 0
Eosinophils Absolute: 0.3
Eosinophils Relative: 2
Lymphocytes Relative: 20
Lymphs Abs: 2.7
Monocytes Absolute: 0.9 — ABNORMAL HIGH
Monocytes Relative: 7
Neutro Abs: 9.4 — ABNORMAL HIGH
Neutrophils Relative %: 70

## 2010-11-04 LAB — POCT I-STAT CREATININE
Creatinine, Ser: 1
Operator id: 284251

## 2010-11-04 LAB — D-DIMER, QUANTITATIVE (NOT AT ARMC): D-Dimer, Quant: 0.32

## 2010-11-20 ENCOUNTER — Emergency Department (HOSPITAL_COMMUNITY)
Admission: EM | Admit: 2010-11-20 | Discharge: 2010-11-20 | Disposition: A | Discharge status: Home / Self Care | Payer: Managed Care, Other (non HMO) | Attending: Emergency Medicine | Admitting: Emergency Medicine

## 2010-11-20 DIAGNOSIS — Y92009 Unspecified place in unspecified non-institutional (private) residence as the place of occurrence of the external cause: Secondary | ICD-10-CM | POA: Insufficient documentation

## 2010-11-20 DIAGNOSIS — M25569 Pain in unspecified knee: Secondary | ICD-10-CM | POA: Insufficient documentation

## 2010-11-20 DIAGNOSIS — E119 Type 2 diabetes mellitus without complications: Secondary | ICD-10-CM | POA: Insufficient documentation

## 2010-11-20 DIAGNOSIS — W010XXA Fall on same level from slipping, tripping and stumbling without subsequent striking against object, initial encounter: Secondary | ICD-10-CM | POA: Insufficient documentation

## 2010-11-20 DIAGNOSIS — IMO0002 Reserved for concepts with insufficient information to code with codable children: Secondary | ICD-10-CM | POA: Insufficient documentation

## 2010-11-20 DIAGNOSIS — R51 Headache: Secondary | ICD-10-CM | POA: Insufficient documentation

## 2010-11-20 DIAGNOSIS — Z794 Long term (current) use of insulin: Secondary | ICD-10-CM | POA: Insufficient documentation

## 2010-11-20 DIAGNOSIS — IMO0001 Reserved for inherently not codable concepts without codable children: Secondary | ICD-10-CM | POA: Insufficient documentation

## 2010-11-20 DIAGNOSIS — S0990XA Unspecified injury of head, initial encounter: Secondary | ICD-10-CM | POA: Insufficient documentation

## 2010-11-23 IMAGING — US US PELVIS COMPLETE MODIFY
1 series · 14 of 25 positions shown · non-contrast
Comparison: None.

CLINICAL DATA: Pelvic pain.

TRANSABDOMINAL AND TRANSVAGINAL ULTRASOUND OF PELVIS
TECHNIQUE: Both transabdominal and transvaginal ultrasound
examinations of the pelvis were performed including evaluation of
the uterus, ovaries, adnexal regions, and pelvic cul-de-sac.

[Series 1: us pelvis complete modify · 0.33mm/px · 14 of 32 slices shown]
[im 1/32]
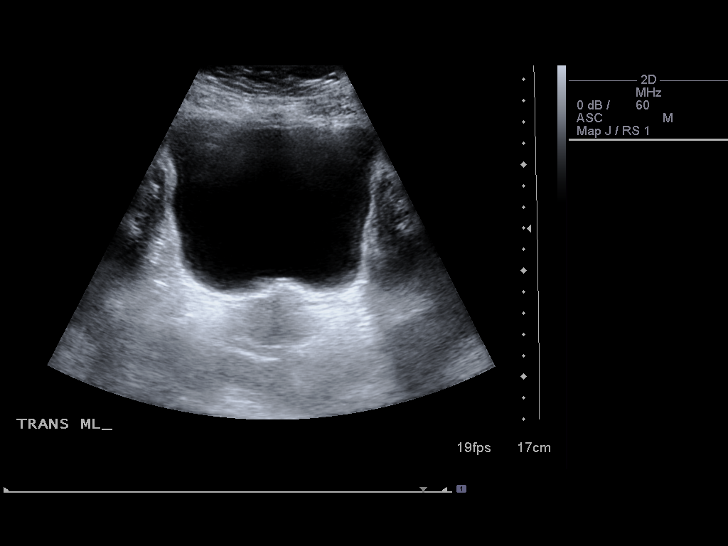
[im 3/32]
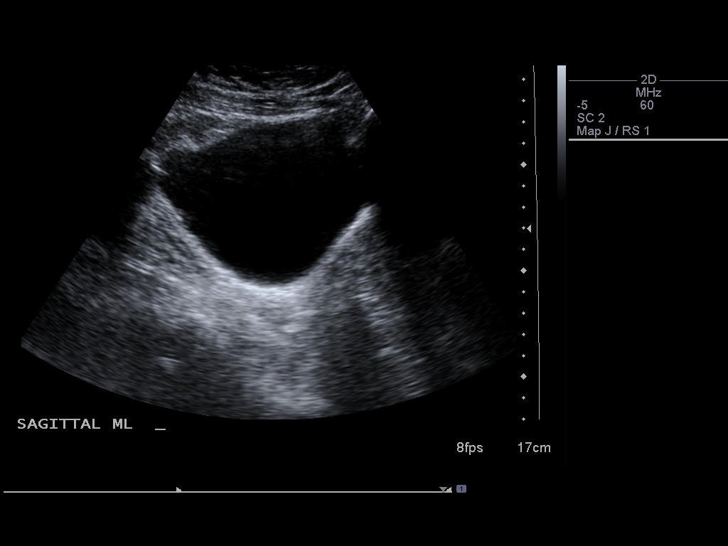
[im 6/32]
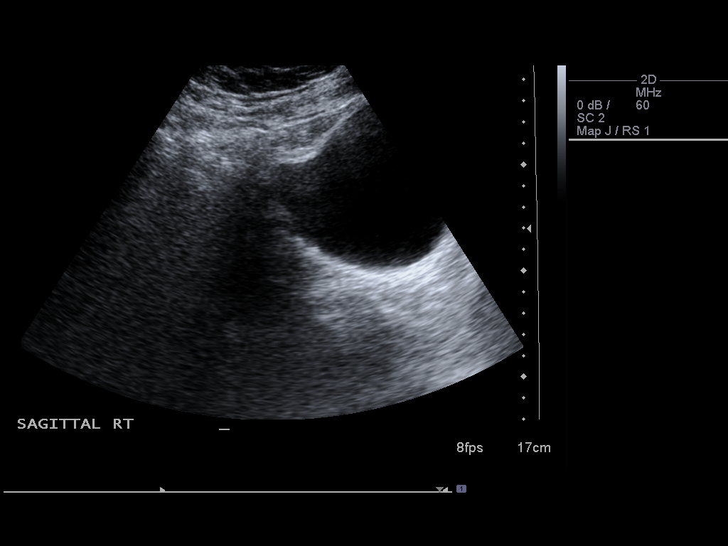
[im 8/32]
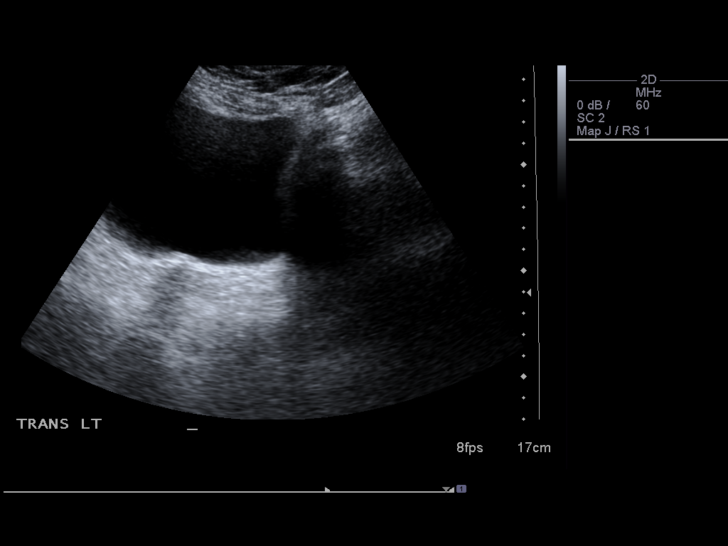
[im 11/32]
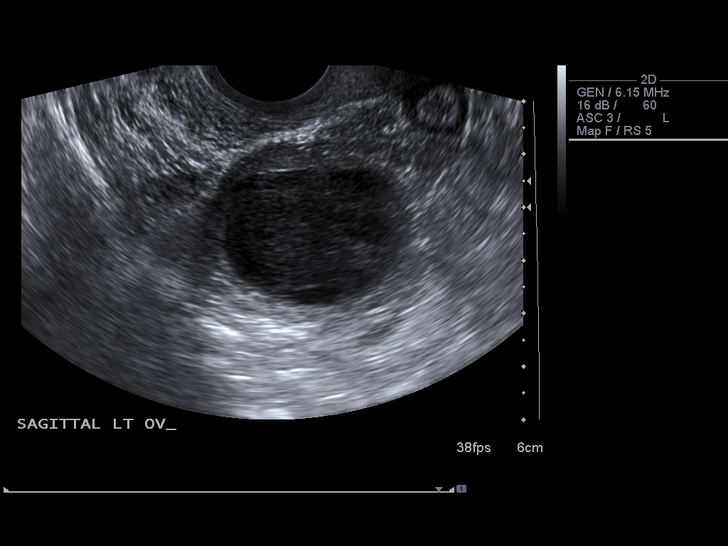
[im 12/32]
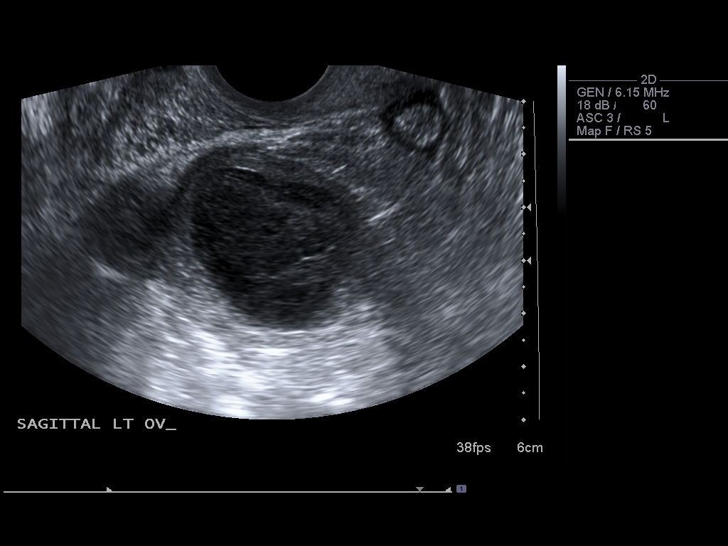
[im 15/32]
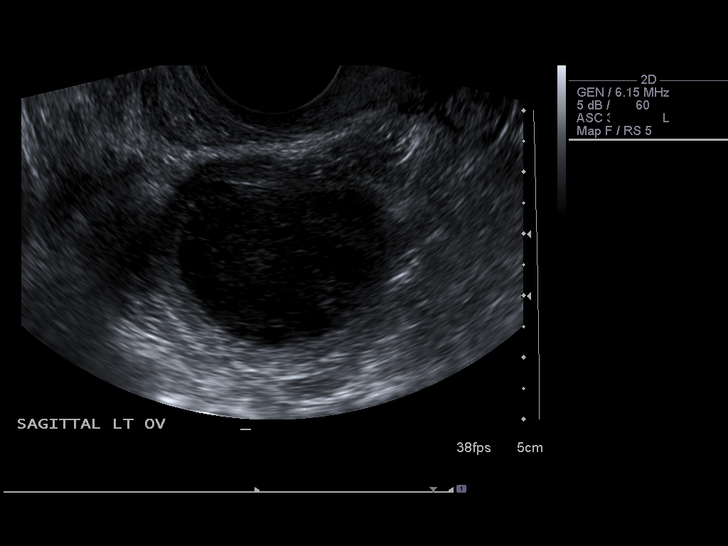
[im 17/32]
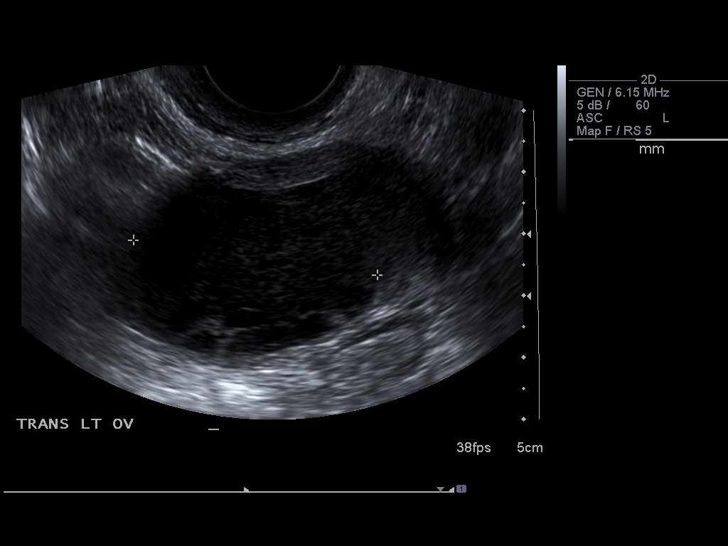
[im 20/32]
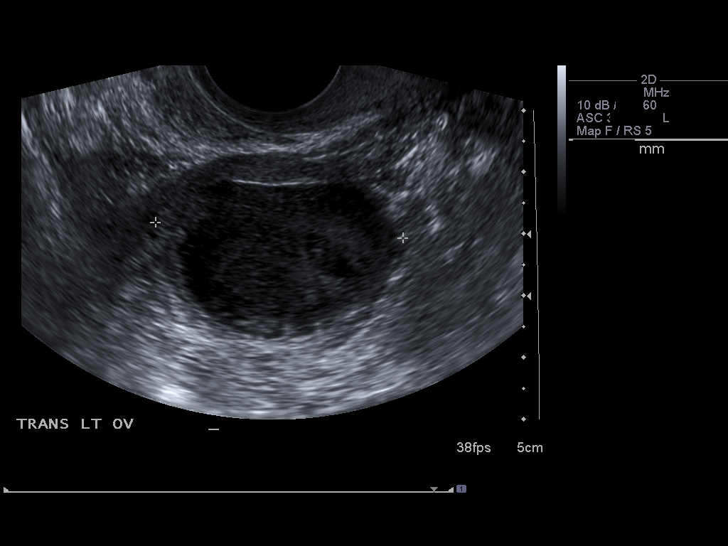
[im 21/32]
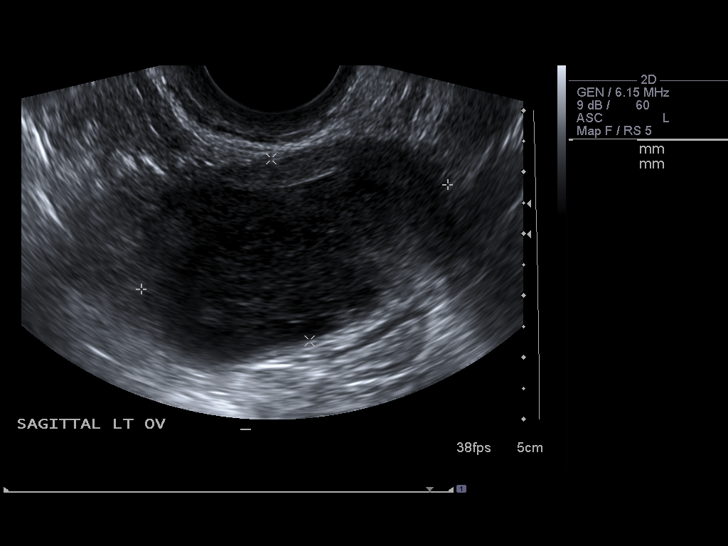
[im 24/32]
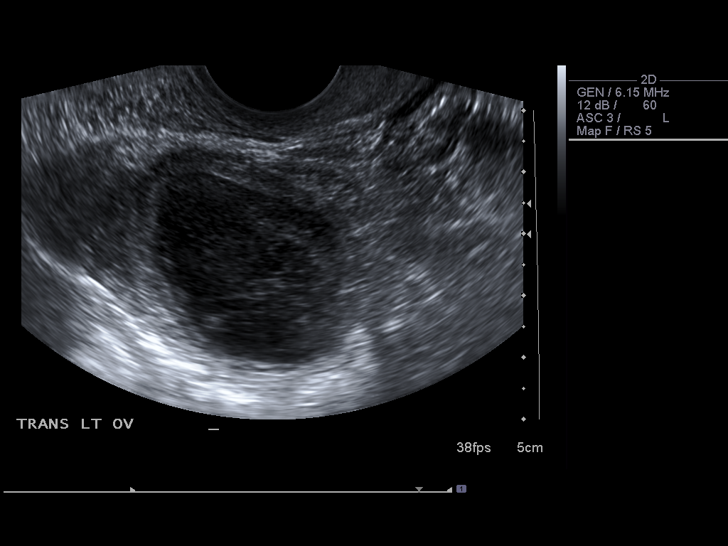
[im 26/32]
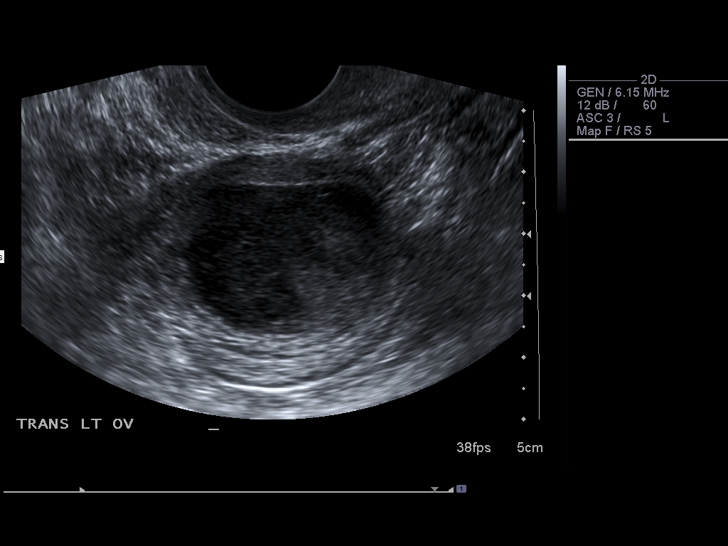
[im 29/32]
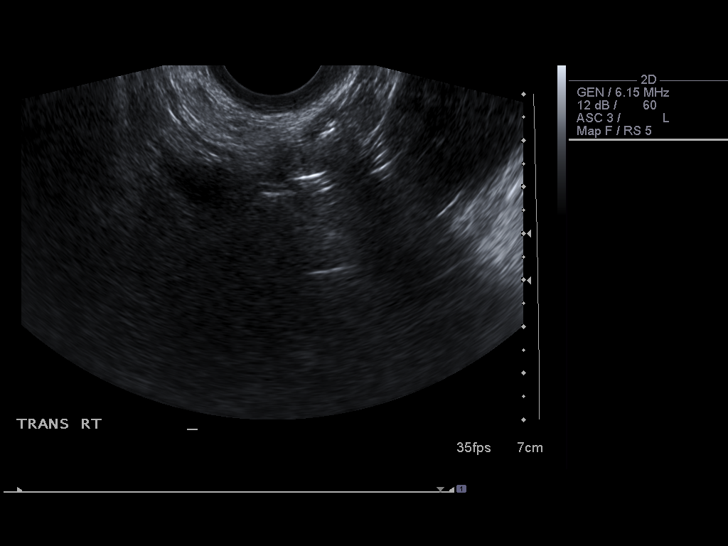
[im 32/32]
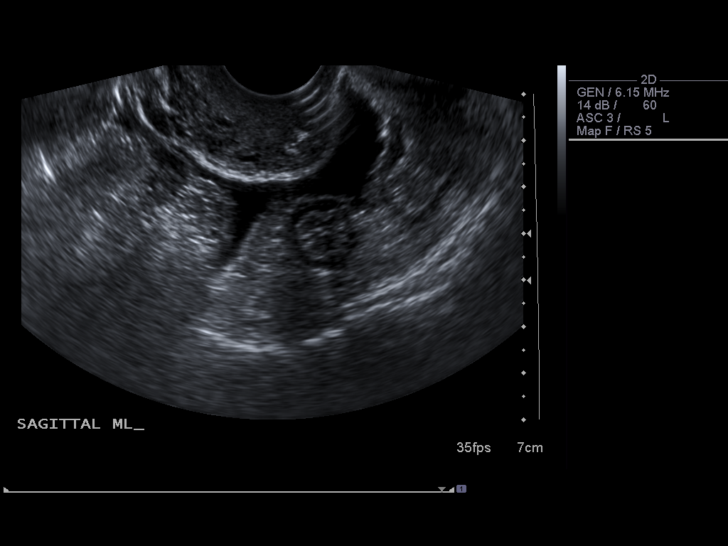

[14 of 25 positions shown; findings below may reference images not displayed]

FINDINGS: Uterus is surgically absent.

Right Ovary is not directly visualized by transabdominal or
transvaginal sonography, however no adnexal mass identified.

Left Ovary measures 5.2 x 3.0 x 4.0 cm.  A complex cyst with low-
level internal echoes and a few thin peripheral septations is seen,
measuring 3.3 x 2.5 x 4.0 cm.  No internal blood flow is seen
within this lesion on color Doppler ultrasound.

Other Findings:  Tiny amount of free fluid noted in the cul-de-sac.
IMPRESSION: 4 cm complex left ovarian cyst with indeterminate, but probably
benign features.  Main differential diagnosis includes hemorrhagic
ovarian cyst and endometrioma.  Follow-up with pelvic ultrasound is
recommended in 6-12 weeks.

## 2010-11-23 IMAGING — US US ABDOMEN COMPLETE
1 series · 14 of 25 positions shown · non-contrast
Comparison: None.

CLINICAL DATA: Abdominal pain.

ABDOMINAL ULTRASOUND COMPLETE

[Series 1: us abdomen complete · 0.37mm/px · 14 of 77 slices shown]
[im 1/77]
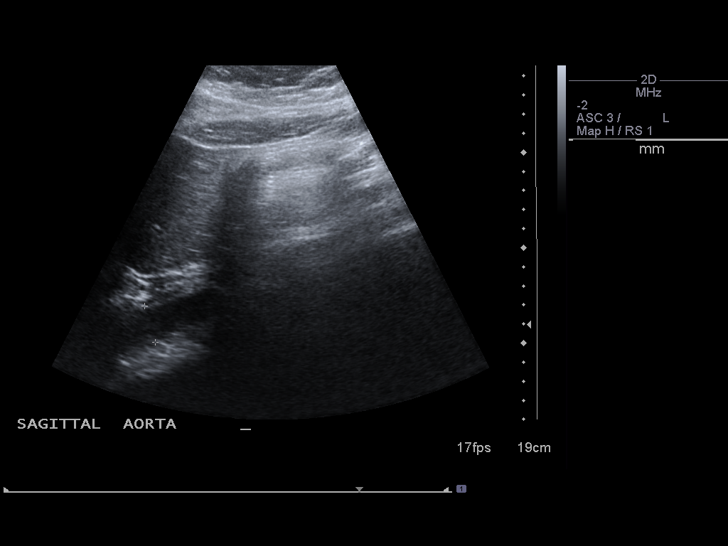
[im 7/77]
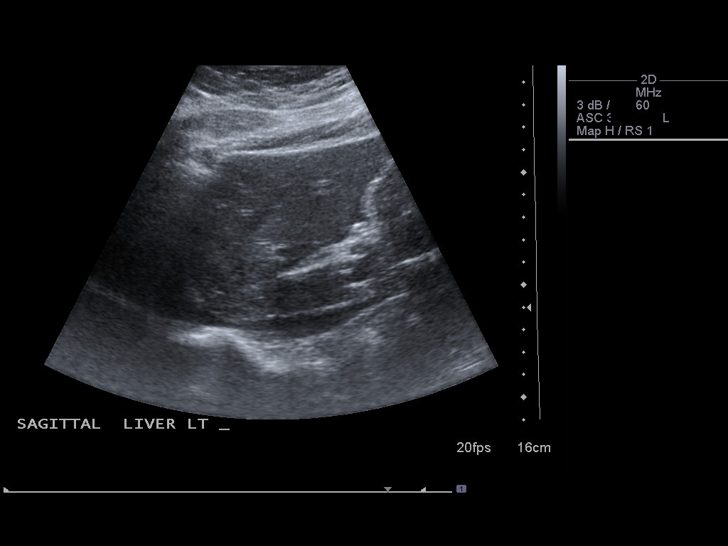
[im 13/77]
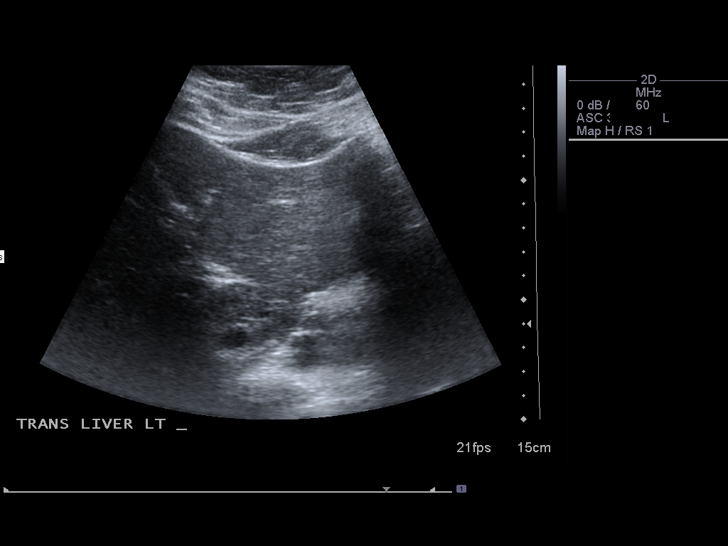
[im 20/77]
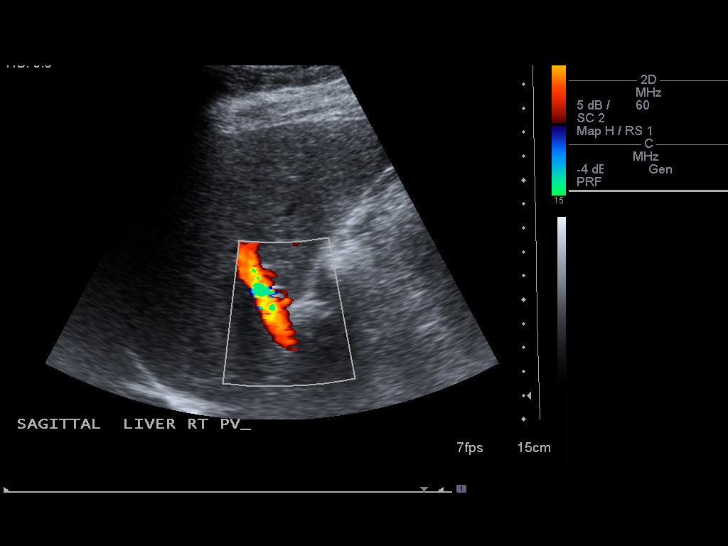
[im 26/77]
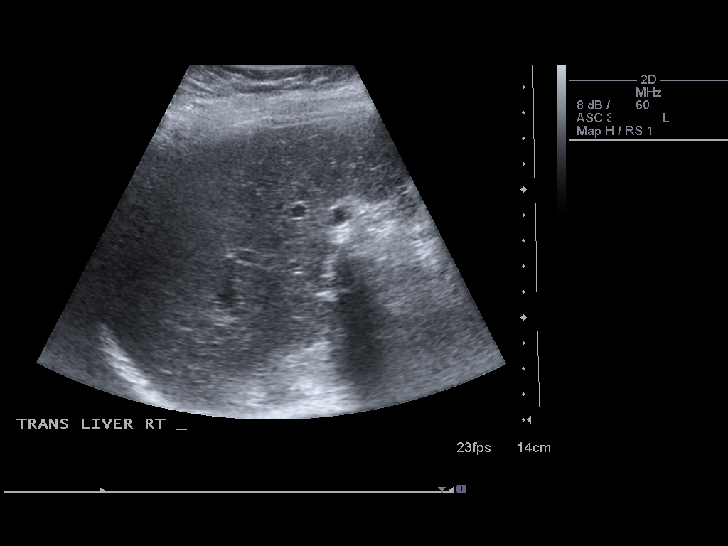
[im 29/77]
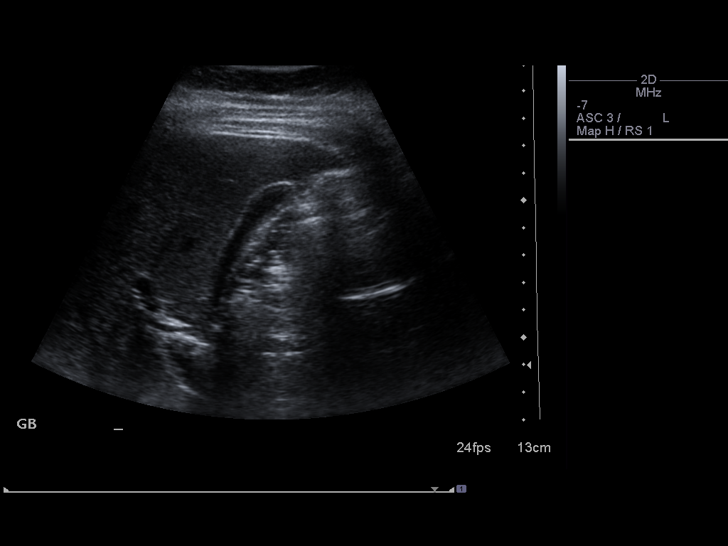
[im 35/77]
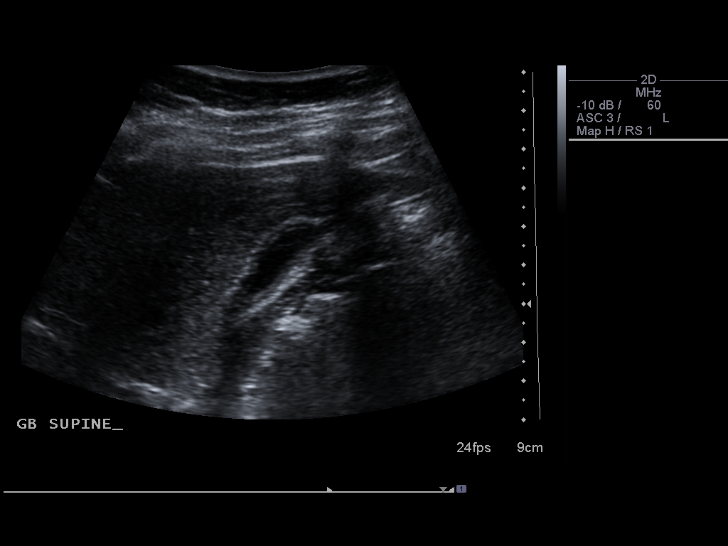
[im 42/77]
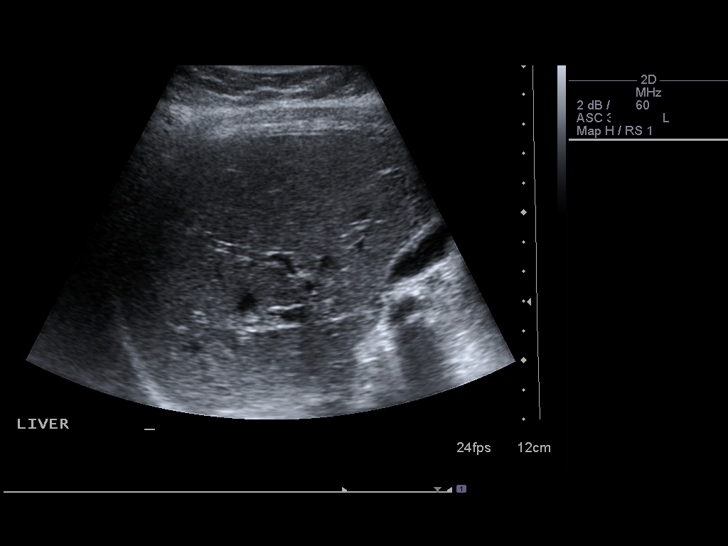
[im 48/77]
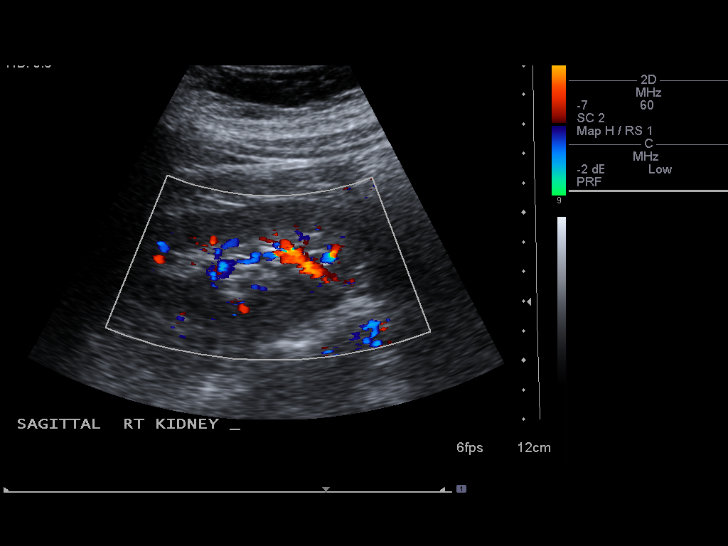
[im 51/77]
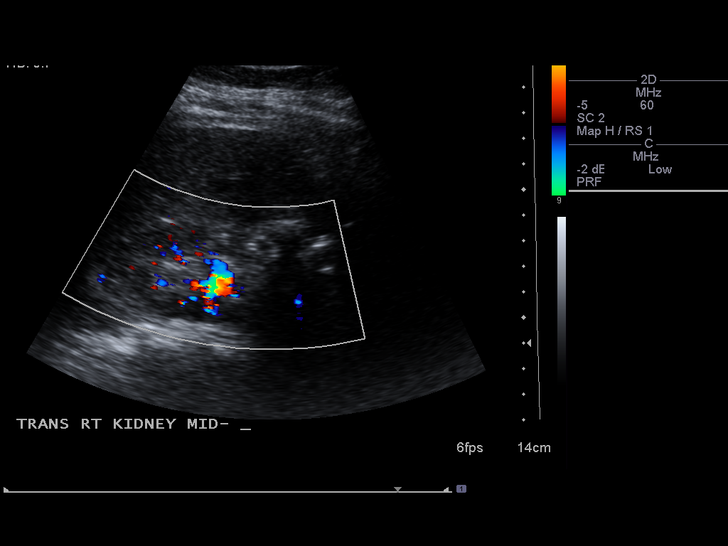
[im 58/77]
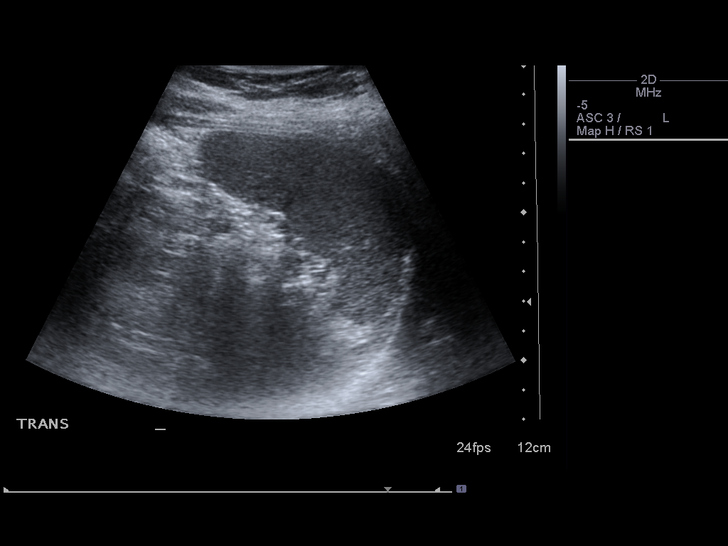
[im 64/77]
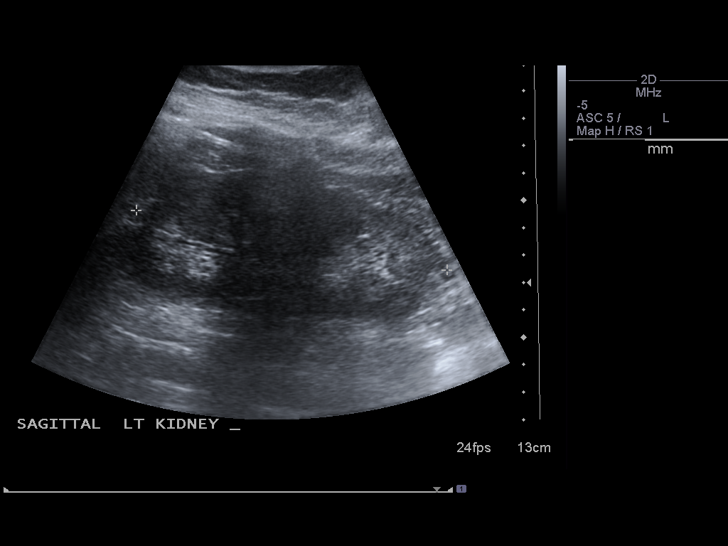
[im 70/77]
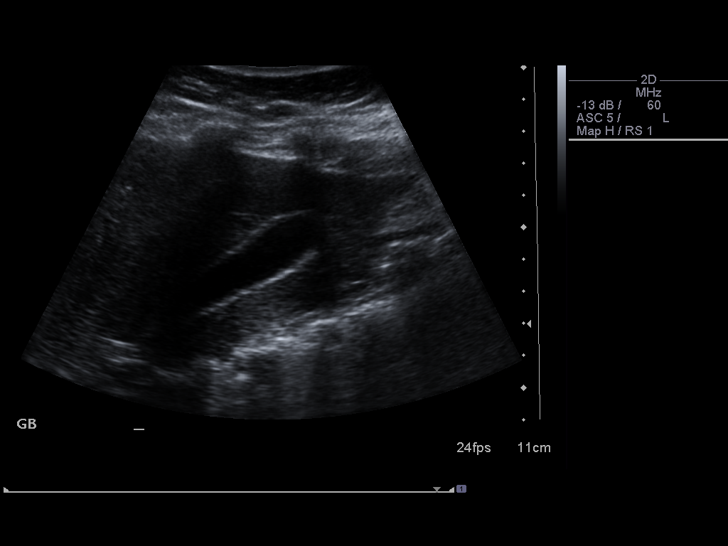
[im 77/77]
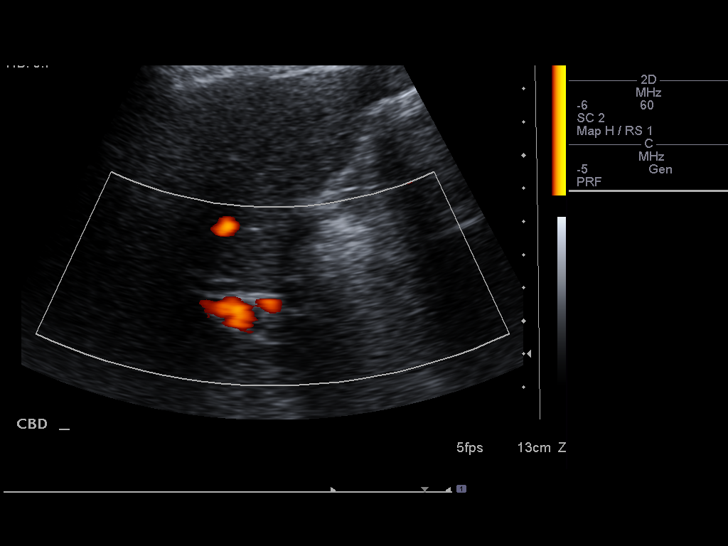

[14 of 25 positions shown; findings below may reference images not displayed]

FINDINGS: Gallbladder:  No gallstones, gallbladder wall thickening, or
pericholecystic fluid.  Incompletely distended state of gallbladder
noted.

Common Bile Duct:  Within normal limits in caliber.  Measures 4 mm
in diameter.

Liver:  No focal lesion identified.  Within normal limits in
parenchymal echogenicity.

IVC:  Appears normal.

Pancreas:  Although the pancreas is difficult to visualize in its
entirety, no focal pancreatic abnormality is identified.

Spleen:  Within normal limits in size and echotexture.

Right kidney:  Normal in size and parenchymal echogenicity.  No
evidence of mass or hydronephrosis.

Left kidney:  Normal in size and parenchymal echogenicity.  No
evidence of mass or hydronephrosis.

Abdominal Aorta:  No aneurysm identified.
IMPRESSION: Negative abdominal ultrasound.

## 2010-12-12 ENCOUNTER — Other Ambulatory Visit: Payer: Self-pay | Admitting: Emergency Medicine

## 2010-12-12 ENCOUNTER — Ambulatory Visit (HOSPITAL_COMMUNITY)
Admission: RE | Admit: 2010-12-12 | Discharge: 2010-12-12 | Disposition: A | Discharge status: Home / Self Care | Payer: Managed Care, Other (non HMO) | Source: Ambulatory Visit | Attending: Emergency Medicine | Admitting: Emergency Medicine

## 2010-12-12 DIAGNOSIS — R109 Unspecified abdominal pain: Secondary | ICD-10-CM | POA: Insufficient documentation

## 2010-12-23 ENCOUNTER — Other Ambulatory Visit (HOSPITAL_COMMUNITY): Payer: Self-pay | Admitting: Rheumatology

## 2010-12-23 ENCOUNTER — Ambulatory Visit (HOSPITAL_COMMUNITY)
Admission: RE | Admit: 2010-12-23 | Discharge: 2010-12-23 | Disposition: A | Discharge status: Home / Self Care | Payer: Managed Care, Other (non HMO) | Source: Ambulatory Visit | Attending: Rheumatology | Admitting: Rheumatology

## 2010-12-23 DIAGNOSIS — D869 Sarcoidosis, unspecified: Secondary | ICD-10-CM

## 2010-12-23 DIAGNOSIS — E119 Type 2 diabetes mellitus without complications: Secondary | ICD-10-CM | POA: Insufficient documentation

## 2010-12-29 ENCOUNTER — Other Ambulatory Visit: Payer: Self-pay | Admitting: Gastroenterology

## 2010-12-29 DIAGNOSIS — R109 Unspecified abdominal pain: Secondary | ICD-10-CM

## 2010-12-31 ENCOUNTER — Encounter: Payer: Self-pay | Admitting: *Deleted

## 2010-12-31 ENCOUNTER — Emergency Department (HOSPITAL_BASED_OUTPATIENT_CLINIC_OR_DEPARTMENT_OTHER)
Admission: EM | Admit: 2010-12-31 | Discharge: 2010-12-31 | Disposition: A | Discharge status: Home / Self Care | Payer: Managed Care, Other (non HMO) | Attending: Emergency Medicine | Admitting: Emergency Medicine

## 2010-12-31 DIAGNOSIS — E162 Hypoglycemia, unspecified: Secondary | ICD-10-CM

## 2010-12-31 DIAGNOSIS — E108 Type 1 diabetes mellitus with unspecified complications: Secondary | ICD-10-CM | POA: Insufficient documentation

## 2010-12-31 DIAGNOSIS — IMO0001 Reserved for inherently not codable concepts without codable children: Secondary | ICD-10-CM | POA: Insufficient documentation

## 2010-12-31 HISTORY — DX: Fibromyalgia: M79.7

## 2010-12-31 HISTORY — DX: Unspecified osteoarthritis, unspecified site: M19.90

## 2010-12-31 LAB — COMPREHENSIVE METABOLIC PANEL
ALT: 31 U/L (ref 0–35)
AST: 22 U/L (ref 0–37)
Albumin: 3.8 g/dL (ref 3.5–5.2)
Alkaline Phosphatase: 155 U/L — ABNORMAL HIGH (ref 39–117)
BUN: 7 mg/dL (ref 6–23)
CO2: 29 mEq/L (ref 19–32)
Calcium: 9.7 mg/dL (ref 8.4–10.5)
Chloride: 96 mEq/L (ref 96–112)
Creatinine, Ser: 0.8 mg/dL (ref 0.50–1.10)
GFR calc Af Amer: >90 mL/min (ref >90)
GFR calc non Af Amer: >90 mL/min (ref >90)
Glucose, Bld: 177 mg/dL — ABNORMAL HIGH (ref 70–99)
Potassium: 4.1 mEq/L (ref 3.5–5.1)
Sodium: 136 mEq/L (ref 135–145)
Total Bilirubin: 0.4 mg/dL (ref 0.3–1.2)
Total Protein: 8 g/dL (ref 6.0–8.3)

## 2010-12-31 LAB — DIFFERENTIAL
Basophils Absolute: 0.1 10*3/uL (ref 0.0–0.1)
Basophils Relative: 0 % (ref 0–1)
Eosinophils Absolute: 0.1 10*3/uL (ref 0.0–0.7)
Eosinophils Relative: 0 % (ref 0–5)
Lymphocytes Relative: 13 % (ref 12–46)
Lymphs Abs: 2 10*3/uL (ref 0.7–4.0)
Monocytes Absolute: 0.7 10*3/uL (ref 0.1–1.0)
Monocytes Relative: 4 % (ref 3–12)
Neutro Abs: 12.9 10*3/uL — ABNORMAL HIGH (ref 1.7–7.7)
Neutrophils Relative %: 82 % — ABNORMAL HIGH (ref 43–77)

## 2010-12-31 LAB — URINALYSIS, ROUTINE W REFLEX MICROSCOPIC
Bilirubin Urine: NEGATIVE
Glucose, UA: 500 mg/dL — AB
Hgb urine dipstick: NEGATIVE
Ketones, ur: NEGATIVE mg/dL
Leukocytes, UA: NEGATIVE
Nitrite: NEGATIVE
Protein, ur: NEGATIVE mg/dL
Specific Gravity, Urine: 1.012 (ref 1.005–1.030)
Urobilinogen, UA: 0.2 mg/dL (ref 0.0–1.0)
pH: 6 (ref 5.0–8.0)

## 2010-12-31 LAB — GLUCOSE, CAPILLARY: Glucose-Capillary: 86 mg/dL (ref 70–99)

## 2010-12-31 LAB — CBC
HCT: 41.3 % (ref 36.0–46.0)
Hemoglobin: 14.3 g/dL (ref 12.0–15.0)
MCH: 32.6 pg (ref 26.0–34.0)
MCHC: 34.6 g/dL (ref 30.0–36.0)
MCV: 94.3 fL (ref 78.0–100.0)
Platelets: 435 10*3/uL — ABNORMAL HIGH (ref 150–400)
RBC: 4.38 MIL/uL (ref 3.87–5.11)
RDW: 11.6 % (ref 11.5–15.5)
WBC: 15.8 10*3/uL — ABNORMAL HIGH (ref 4.0–10.5)

## 2010-12-31 LAB — LIPASE, BLOOD: Lipase: 10 U/L — ABNORMAL LOW (ref 11–59)

## 2010-12-31 NOTE — ED Provider Notes (Signed)
History     CSN: [id number] Arrival date & time: 12/31/2010  8:30 PM   First MD Initiated Contact with Patient 12/31/10 2128      Chief Complaint  Patient presents with  . Hypoglycemia    (Consider location/radiation/quality/duration/timing/severity/associated sxs/prior treatment) Patient is a 40 y.o. female presenting with diabetes problem. The history is provided by the patient. No language interpreter was used.  Diabetes She has type 1 diabetes mellitus. Hypoglycemia symptoms include confusion, dizziness and pallor. Hypoglycemia complications include blackouts. There are no diabetic complications. Risk factors for coronary artery disease include family history. Current diabetic treatment includes insulin injections. She is compliant with treatment all of the time. Her weight is stable. Her home blood glucose trend is decreasing rapidly.   Pt is being evaluated for possible gallbladder disease.   Pt reports she has a hidascan scheduled for Tuesday.  Pt reports her glucose dropped while she was shopping at [employer].  Pt takes 8 units of regular before meals,  26 lantus at bedtime.  She is followed b Dr. Lurene Shadow.  Pt reports she is eating less due to pain. Past Medical History  Diagnosis Date  . Diabetes mellitus   . Fibromyalgia   . Arthritis     Past Surgical History  Procedure Date  . Cesarean section   . Abdominal hysterectomy   . Tonsillectomy   . Lipoma removed   . Knee surgery     No family history on file.  History  Substance Use Topics  . Smoking status: Never Smoker   . Smokeless tobacco: Not on file  . Alcohol Use: No    OB History    Grav Para Term Preterm Abortions TAB SAB Ect Mult Living                  Review of Systems  Skin: Positive for pallor.  Neurological: Positive for dizziness.  Psychiatric/Behavioral: Positive for confusion.  All other systems reviewed and are negative.    Allergies  Ibuprofen; Tylenol; and Keflex  Home Medications    No current outpatient prescriptions on file.  BP 153/78  Pulse 93  Temp(Src) 97.8 F (36.6 C) (Oral)  Resp 18  SpO2 95%  Physical Exam  Vitals reviewed. Constitutional: She appears well-developed and well-nourished.  HENT:  Head: Normocephalic and atraumatic.  Right Ear: External ear normal.  Left Ear: External ear normal.  Eyes: Conjunctivae are normal. Pupils are equal, round, and reactive to light.  Neck: Normal range of motion. Neck supple.  Cardiovascular: Normal rate and normal heart sounds.   Pulmonary/Chest: Effort normal.  Abdominal: Soft. Bowel sounds are normal.  Musculoskeletal: Normal range of motion.  Neurological: She is alert.  Skin: Skin is warm.  Psychiatric: She has a normal mood and affect.    ED Course  Procedures (including critical care time)   Labs Reviewed  GLUCOSE, CAPILLARY  POCT CBG MONITORING   No results found.   No diagnosis found.    MDM  Pt tolerating food and drinking,  Labs pending.  I advised meal,  Drop reg insulin to 5 units before meals,  Call dr. Lurene Shadow to make sure she agrees.          Langston Masker, [place] 12/31/10 2155

## 2010-12-31 NOTE — ED Provider Notes (Signed)
Medical screening examination/treatment/procedure(s) were performed by non-physician practitioner and as supervising physician I was immediately available for consultation/collaboration.   Rajon Bisig A. Patrica Duel, MD 12/31/10 2256

## 2010-12-31 NOTE — ED Notes (Addendum)
See arrival notes.

## 2010-12-31 NOTE — ED Notes (Signed)
K. Sofia, PA at bedside.  

## 2010-12-31 NOTE — ED Notes (Signed)
Pt given soda and peanut butter/crackers to eat.

## 2010-12-31 NOTE — ED Provider Notes (Signed)
Patient was notified of normal liver function.  She was told to hold her novolog until she speaks to her PCP.  She was discharged home in good condition.  Cyndra Numbers, MD 12/31/10 [phone]

## 2010-12-31 NOTE — ED Notes (Signed)
Pt lying in bed eating peanut butter and crackers. CAO and in NAD. Family at bedside.

## 2010-12-31 NOTE — ED Notes (Signed)
Pt given frozen meal per MD order.

## 2011-01-01 MED FILL — Glucose Gel 40%: ORAL | Qty: 0.83 | Status: AC

## 2011-01-06 ENCOUNTER — Other Ambulatory Visit: Payer: Self-pay | Admitting: Gastroenterology

## 2011-01-06 ENCOUNTER — Encounter (HOSPITAL_COMMUNITY)
Admission: RE | Admit: 2011-01-06 | Discharge: 2011-01-06 | Disposition: A | Discharge status: Home / Self Care | Payer: Managed Care, Other (non HMO) | Source: Ambulatory Visit | Attending: Gastroenterology | Admitting: Gastroenterology

## 2011-01-06 DIAGNOSIS — R1013 Epigastric pain: Secondary | ICD-10-CM

## 2011-01-06 DIAGNOSIS — R109 Unspecified abdominal pain: Secondary | ICD-10-CM

## 2011-01-06 MED ORDER — SINCALIDE 5 MCG IJ SOLR
INTRAMUSCULAR | Status: AC
  Administered 2011-01-06: 1.62 ug via INTRAVENOUS
  Filled 2011-01-06: qty 5

## 2011-01-06 MED ORDER — TECHNETIUM TC 99M MEBROFENIN IV KIT: 5.0000 | PACK | Freq: Once | INTRAVENOUS | Status: AC | PRN

## 2011-01-28 ENCOUNTER — Telehealth (INDEPENDENT_AMBULATORY_CARE_PROVIDER_SITE_OTHER): Payer: Self-pay

## 2011-01-28 ENCOUNTER — Emergency Department (HOSPITAL_COMMUNITY)
Admission: EM | Admit: 2011-01-28 | Discharge: 2011-01-28 | Disposition: A | Discharge status: Home / Self Care | Payer: Managed Care, Other (non HMO) | Attending: Emergency Medicine | Admitting: Emergency Medicine

## 2011-01-28 ENCOUNTER — Encounter (HOSPITAL_COMMUNITY): Payer: Self-pay

## 2011-01-28 DIAGNOSIS — R109 Unspecified abdominal pain: Secondary | ICD-10-CM | POA: Insufficient documentation

## 2011-01-28 DIAGNOSIS — R10819 Abdominal tenderness, unspecified site: Secondary | ICD-10-CM | POA: Insufficient documentation

## 2011-01-28 DIAGNOSIS — R112 Nausea with vomiting, unspecified: Secondary | ICD-10-CM | POA: Insufficient documentation

## 2011-01-28 LAB — COMPREHENSIVE METABOLIC PANEL
ALT: 26 U/L (ref 0–35)
AST: 16 U/L (ref 0–37)
Albumin: 3.1 g/dL — ABNORMAL LOW (ref 3.5–5.2)
Alkaline Phosphatase: 146 U/L — ABNORMAL HIGH (ref 39–117)
BUN: 5 mg/dL — ABNORMAL LOW (ref 6–23)
CO2: 29 mEq/L (ref 19–32)
Calcium: 9.5 mg/dL (ref 8.4–10.5)
Chloride: 96 mEq/L (ref 96–112)
Creatinine, Ser: 0.91 mg/dL (ref 0.50–1.10)
GFR calc Af Amer: >90 mL/min (ref >90)
GFR calc non Af Amer: 79 mL/min — ABNORMAL LOW (ref >90)
Glucose, Bld: 156 mg/dL — ABNORMAL HIGH (ref 70–99)
Potassium: 3.6 mEq/L (ref 3.5–5.1)
Sodium: 138 mEq/L (ref 135–145)
Total Bilirubin: 0.3 mg/dL (ref 0.3–1.2)
Total Protein: 7.5 g/dL (ref 6.0–8.3)

## 2011-01-28 LAB — URINALYSIS, ROUTINE W REFLEX MICROSCOPIC
Bilirubin Urine: NEGATIVE
Glucose, UA: 500 mg/dL — AB
Hgb urine dipstick: NEGATIVE
Ketones, ur: NEGATIVE mg/dL
Leukocytes, UA: NEGATIVE
Nitrite: NEGATIVE
Protein, ur: NEGATIVE mg/dL
Specific Gravity, Urine: 1.013 (ref 1.005–1.030)
Urobilinogen, UA: 0.2 mg/dL (ref 0.0–1.0)
pH: 5.5 (ref 5.0–8.0)

## 2011-01-28 LAB — CBC
HCT: 42.3 % (ref 36.0–46.0)
Hemoglobin: 14.6 g/dL (ref 12.0–15.0)
MCH: 33 pg (ref 26.0–34.0)
MCHC: 34.5 g/dL (ref 30.0–36.0)
MCV: 95.5 fL (ref 78.0–100.0)
Platelets: 411 10*3/uL — ABNORMAL HIGH (ref 150–400)
RBC: 4.43 MIL/uL (ref 3.87–5.11)
RDW: 12.8 % (ref 11.5–15.5)
WBC: 13.6 10*3/uL — ABNORMAL HIGH (ref 4.0–10.5)

## 2011-01-28 LAB — DIFFERENTIAL
Basophils Absolute: 0.1 10*3/uL (ref 0.0–0.1)
Basophils Relative: 1 % (ref 0–1)
Eosinophils Absolute: 0.5 10*3/uL (ref 0.0–0.7)
Eosinophils Relative: 4 % (ref 0–5)
Lymphocytes Relative: 25 % (ref 12–46)
Lymphs Abs: 3.4 10*3/uL (ref 0.7–4.0)
Monocytes Absolute: 1.1 10*3/uL — ABNORMAL HIGH (ref 0.1–1.0)
Monocytes Relative: 8 % (ref 3–12)
Neutro Abs: 8.5 10*3/uL — ABNORMAL HIGH (ref 1.7–7.7)
Neutrophils Relative %: 62 % (ref 43–77)

## 2011-01-28 LAB — LIPASE, BLOOD: Lipase: 14 U/L (ref 11–59)

## 2011-01-28 LAB — PREGNANCY, URINE: Preg Test, Ur: NEGATIVE

## 2011-01-28 MED ORDER — OXYCODONE HCL 5 MG PO CAPS: 5.0000 mg | ORAL_CAPSULE | ORAL | Status: DC | PRN

## 2011-01-28 MED ORDER — HYDROMORPHONE HCL PF 1 MG/ML IJ SOLN
1.0000 mg | Freq: Once | INTRAMUSCULAR | Status: AC
  Administered 2011-01-28: 1 mg via INTRAVENOUS
  Filled 2011-01-28: qty 1

## 2011-01-28 MED ORDER — ONDANSETRON 8 MG PO TBDP: 8.0000 mg | ORAL_TABLET | Freq: Three times a day (TID) | ORAL | Status: DC | PRN

## 2011-01-28 MED ORDER — SODIUM CHLORIDE 0.9 % IV BOLUS (SEPSIS)
1000.0000 mL | Freq: Once | INTRAVENOUS | Status: AC
  Administered 2011-01-28: 1000 mL via INTRAVENOUS

## 2011-01-28 MED ORDER — ONDANSETRON HCL 4 MG/2ML IJ SOLN
4.0000 mg | Freq: Once | INTRAMUSCULAR | Status: AC
  Administered 2011-01-28: 4 mg via INTRAVENOUS
  Filled 2011-01-28: qty 2

## 2011-01-28 NOTE — ED Provider Notes (Deleted)
Patient presents to the emergency room with complaints of right-sided abdominal pain. She's been having trouble for an extended period of time. This has been ongoing for at least since last month. Patient states anything she eats seems to make her have this pain. She also gets nausea and vomiting. Patient has been seeing her GI doctor as well as a rheumatologist. She has had elevated liver enzymes. Her GI doctor feels that she might need to have her gallbladder taken out and she is supposed to be seeing a Careers adviser on the 15th of this month. Patient had worsening symptoms today including vomiting and pain. She called her doctors and they told her to come to the emergency room Physical Exam  BP 133/83  Pulse 101  Temp(Src) 98.3 F (36.8 C) (Oral)  Resp 16  Ht 5\' 4"  (1.626 m)  Wt 178 lb (80.74 kg)  BMI 30.55 kg/m2  SpO2 99%  Physical Exam  Constitutional: She appears well-developed and well-nourished.  HENT:  Head: Normocephalic and atraumatic.  Neck: Normal range of motion. Neck supple.  Cardiovascular: Normal rate and regular rhythm.   Pulmonary/Chest: No respiratory distress.  Abdominal: Soft. There is tenderness. There is no rebound and no guarding.    ED Course  Procedures  MDM Labs have been ordered. MS he has been initiated  1:06 PM       Celene Kras, MD 01/28/11 1306

## 2011-01-28 NOTE — Telephone Encounter (Signed)
Pt called c/o severe abd pain and vomiting. Pt has appt 1-15 with our practice for eval gb. I advised pt if she was having uncontrolled pain and vomiting she needs to go to ER for workup. Pt states Dr Elnoria Howard advised her also she may need to go to ER. Pt states she will go to ER to eval abd pain and vomiting.

## 2011-01-28 NOTE — ED Provider Notes (Signed)
History     CSN: [id number]  Arrival date & time 01/28/11  1148   First MD Initiated Contact with Patient 01/28/11 1307      Chief Complaint  Patient presents with  . Abdominal Pain    (Consider location/radiation/quality/duration/timing/severity/associated sxs/prior treatment) HPI Patient presents to the emergency room with complaints of right-sided abdominal pain. She's been having trouble for an extended period of time. This has been ongoing for at least since last month. Patient states anything she eats seems to make her have this pain. She also gets nausea and vomiting. Patient has been seeing her GI doctor as well as a rheumatologist. She has had elevated liver enzymes. Her GI doctor feels that she might need to have her gallbladder taken out and she is supposed to be seeing a Careers adviser on the 15th of this month. Patient had worsening symptoms today including vomiting and pain. She called her doctors and they told her to come to the emergency room Past Medical History  Diagnosis Date  . Diabetes mellitus   . Fibromyalgia   . Arthritis     Past Surgical History  Procedure Date  . Cesarean section   . Abdominal hysterectomy   . Tonsillectomy   . Lipoma removed   . Knee surgery     No family history on file.  History  Substance Use Topics  . Smoking status: Never Smoker   . Smokeless tobacco: Not on file  . Alcohol Use: No    OB History    Grav Para Term Preterm Abortions TAB SAB Ect Mult Living                  Review of Systems  All other systems reviewed and are negative.    Allergies  Ibuprofen; Tylenol; and Keflex  Home Medications   Current Outpatient Rx  Name Route Sig Dispense Refill  . CITALOPRAM HYDROBROMIDE 40 MG PO TABS Oral Take 40 mg by mouth daily.      . INSULIN ASPART 100 UNIT/ML [place] SOLN Subcutaneous Inject 8 Units into the skin 3 (three) times daily before meals.      . INSULIN GLARGINE 100 UNIT/ML [place] SOLN Subcutaneous Inject 28 Units  into the skin at bedtime.      Marland Kitchen ONDANSETRON 8 MG PO TBDP Oral Take 1 tablet (8 mg total) by mouth every 8 (eight) hours as needed for nausea. 20 tablet 0  . OXYCODONE HCL 5 MG PO CAPS Oral Take 1 capsule (5 mg total) by mouth every 4 (four) hours as needed. For pain 16 capsule 0    BP 133/83  Pulse 101  Temp(Src) 98.3 F (36.8 C) (Oral)  Resp 16  Ht 5\' 4"  (1.626 m)  Wt 178 lb (80.74 kg)  BMI 30.55 kg/m2  SpO2 99%  Physical Exam  Nursing note and vitals reviewed. Constitutional: She appears well-developed and well-nourished. No distress.  HENT:  Head: Normocephalic and atraumatic.  Right Ear: External ear normal.  Left Ear: External ear normal.  Eyes: Conjunctivae are normal. Right eye exhibits no discharge. Left eye exhibits no discharge. No scleral icterus.  Neck: Neck supple. No tracheal deviation present.  Cardiovascular: Normal rate, regular rhythm and intact distal pulses.   Pulmonary/Chest: Effort normal and breath sounds normal. No stridor. No respiratory distress. She has no wheezes. She has no rales.  Abdominal: Soft. Bowel sounds are normal. She exhibits no distension. There is tenderness in the right upper quadrant. There is no rebound and no guarding.  Musculoskeletal: She exhibits no edema and no tenderness.  Neurological: She is alert. She has normal strength. No sensory deficit. Cranial nerve deficit:  no gross defecits noted. She exhibits normal muscle tone. She displays no seizure activity. Coordination normal.  Skin: Skin is warm and dry. No rash noted.  Psychiatric: She has a normal mood and affect.    ED Course  Procedures (including critical care time) Patient was given pain meds and antinausea medications. I did review her old records which indicate she had a normal HIDA scan as well as a normal gallbladder ultrasound Labs Reviewed  CBC - Abnormal; Notable for the following:    WBC 13.6 (*)    Platelets 411 (*)    All other components within normal  limits  DIFFERENTIAL - Abnormal; Notable for the following:    Neutro Abs 8.5 (*)    Monocytes Absolute 1.1 (*)    All other components within normal limits  COMPREHENSIVE METABOLIC PANEL - Abnormal; Notable for the following:    Glucose, Bld 156 (*)    BUN 5 (*)    Albumin 3.1 (*)    Alkaline Phosphatase 146 (*)    GFR calc non Af Amer 79 (*)    All other components within normal limits  URINALYSIS, ROUTINE W REFLEX MICROSCOPIC - Abnormal; Notable for the following:    Glucose, UA 500 (*)    All other components within normal limits  LIPASE, BLOOD  PREGNANCY, URINE   No results found.   1. Abdominal pain       MDM  Patient's labs are reassuring today. I discussed the case with the general surgeon on call. I spoke with the PA who discussed the case with Dr. Dwain Sarna. They feel it is reasonable for the patient to followup as an outpatient. I discussed the findings with the patient and she is comfortable with discharge.        Celene Kras, MD 01/28/11 [phone]

## 2011-01-28 NOTE — ED Notes (Signed)
Rt. Side abdominal pain and n/v. Pt. Has an appointment with Dr. Rolley Sims on the 15th of this month, for an evaluation for chole and liver biopsy,  Pt. 's  Pain level has increased. Also having n/v

## 2011-01-31 ENCOUNTER — Ambulatory Visit (INDEPENDENT_AMBULATORY_CARE_PROVIDER_SITE_OTHER): Payer: Managed Care, Other (non HMO)

## 2011-01-31 DIAGNOSIS — M79609 Pain in unspecified limb: Secondary | ICD-10-CM

## 2011-01-31 DIAGNOSIS — S9030XA Contusion of unspecified foot, initial encounter: Secondary | ICD-10-CM

## 2011-02-03 ENCOUNTER — Encounter (INDEPENDENT_AMBULATORY_CARE_PROVIDER_SITE_OTHER): Payer: Self-pay | Admitting: General Surgery

## 2011-02-03 ENCOUNTER — Ambulatory Visit (INDEPENDENT_AMBULATORY_CARE_PROVIDER_SITE_OTHER): Payer: Managed Care, Other (non HMO) | Admitting: General Surgery

## 2011-02-03 ENCOUNTER — Encounter (HOSPITAL_COMMUNITY): Payer: Self-pay | Admitting: Pharmacy Technician

## 2011-02-03 DIAGNOSIS — R1011 Right upper quadrant pain: Secondary | ICD-10-CM

## 2011-02-03 NOTE — Progress Notes (Signed)
Patient ID: Maureen [name], female   DOB: 05/13/1970, 40 y.o.   MRN: [id number]  Chief Complaint  Patient presents with  . Other    Eval biliary dyskinesia    HPI Maureen [name] is a 41 y.o. female.  The patient is referred by Dr. Hung for evaluation of abdominal pain, nausea and vomiting and elevated LFTs. She has been evaluated by her primary physician and by Dr. Hung for some time working up the elevated LFTs. She states that she has nausea and vomiting and right-sided flank pain with some radiation to the front which she described as constant and "kicking" pain. She states that this is worse after eating fatty foods or creamy foods or fried foods. She states that this has been on-and-off for several years but has been pretty much nonstop since mid-December. She has lost 10 pounds over the last month due to her fear of eating. She had an EGD and a colonoscopy 6 years ago for burning chest pain and bloody stools but this was felt to be hemorrhoids and no other obvious pathology was identified per the patient report. She has had an ultrasound which demonstrated no gallstones. She had a HIDA scan which demonstrated a 53% ejection fraction. She also has elevated LFTs but hepatitis workup has been negative. She takes oxycodone daily for relief of this discomfort. HPI  Past Medical History  Diagnosis Date  . Diabetes mellitus   . Fibromyalgia   . Arthritis   . Hyperlipidemia   . Chills   . Fever   . Abdominal pain   . Nausea & vomiting   . Biliary dyskinesia     Past Surgical History  Procedure Date  . Tonsillectomy 2001  . Lipoma removed   . Knee surgery   . Cesarean section 1997, 1999  . Abdominal hysterectomy 2001  . Tumor removal 2000    lower back    Family History  Problem Relation Age of Onset  . Cancer Father     lymphoma  . Cancer Maternal Grandmother     colon    Social History History  Substance Use Topics  . Smoking status: Never Smoker   . Smokeless  tobacco: Never Used  . Alcohol Use: No    Allergies  Allergen Reactions  . Ibuprofen Nausea And Vomiting  . Tylenol (Acetaminophen) Other (See Comments)    Liver enzymes elevated.  . Keflex Rash    Current Outpatient Prescriptions  Medication Sig Dispense Refill  . carisoprodol (SOMA) 350 MG tablet as needed.      . imipramine (TOFRANIL-PM) 100 MG capsule Daily.      . citalopram (CELEXA) 40 MG tablet Take 40 mg by mouth daily.        . insulin aspart (NOVOLOG) 100 UNIT/ML injection Inject 8 Units into the skin 3 (three) times daily before meals.        . insulin glargine (LANTUS) 100 UNIT/ML injection Inject 28 Units into the skin at bedtime.        . ondansetron (ZOFRAN ODT) 8 MG disintegrating tablet Take 1 tablet (8 mg total) by mouth every 8 (eight) hours as needed for nausea.  20 tablet  0  . oxycodone (OXY-IR) 5 MG capsule Take 1 capsule (5 mg total) by mouth every 4 (four) hours as needed. For pain  16 capsule  0    Review of Systems Review of Systems All other review of systems negative or noncontributory except as stated in the HPI  Blood pressure   130/76, pulse 68, temperature 98.2 F (36.8 C), temperature source Temporal, resp. rate 18, height 5' 4" (1.626 m), weight 179 lb 6.4 oz (81.375 kg).  Physical Exam Physical Exam Physical Exam  Vitals reviewed. Constitutional: He is oriented to person, place, and time. He appears well-developed and well-nourished. No distress.  HENT:  Head: Normocephalic and atraumatic.  Mouth/Throat: No oropharyngeal exudate.  Eyes: Conjunctivae and EOM are normal. Pupils are equal, round, and reactive to light. Right eye exhibits no discharge. Left eye exhibits no discharge. No scleral icterus.  Neck: Normal range of motion. No tracheal deviation present.  Cardiovascular: Normal rate, regular rhythm and normal heart sounds.   Pulmonary/Chest: Effort normal and breath sounds normal. No stridor. No respiratory distress. He has no wheezes.  He has no rales. He exhibits no tenderness.  Abdominal: Soft. Bowel sounds are normal. He exhibits no distension and no mass. There is no tenderness. There is no rebound and no guarding.  Musculoskeletal: Normal range of motion. He exhibits no edema and no tenderness.  Neurological: He is alert and oriented to person, place, and time.  Skin: Skin is warm and dry. No rash noted. He is not diaphoretic. No erythema. No pallor.  Psychiatric: He has a normal mood and affect. His behavior is normal. Judgment and thought content normal.    Data Reviewed [place], [place], I have personally discussed this case with Dr. Hung  Assessment    Right-sided abdominal pain of uncertain etiology. I have discussed this case with her gastroenterologist and he is concerned that this may be due to biliary dyskinesia or due to sphincter of Odi dysfunction. I had a long discussion with the patient regarding the options of continued observation versus cholecystectomy versus ERCP for treatment of this problem. I am not convinced that this is due to her gallbladder although she does have some symptoms which certainly could be due to this given her postprandial pain and nausea. We discussed the option for cholecystectomy and I explained that we did not know if this will fix her problem and would really be 50-50 chance of success. I explained that her biggest risk of the surgery would be that she has persistent symptoms. We also discussed the risk of diarrhea, bile duct injury, need for open surgery, infection, bleeding, pain, scarring, need for future surgery, injury to the bowels and she states understanding and would like to proceed with cholecystectomy. Again, she did express understanding that treatment is based solely on her symptoms and the hoped for relief of her symptoms. Given her LFTs, we will also plan for a liver biopsy as requested by her gastroenterologist at the same time of her procedure.    Plan    We will plan for  fluoroscopic cholecystectomy with cholangiogram and liver biopsy when available.       Naje Rice DAVID 02/03/2011, 2:23 PM    

## 2011-02-06 ENCOUNTER — Inpatient Hospital Stay (HOSPITAL_COMMUNITY)
Admission: RE | Admit: 2011-02-06 | Discharge: 2011-02-06 | Payer: Managed Care, Other (non HMO) | Source: Ambulatory Visit

## 2011-02-06 ENCOUNTER — Other Ambulatory Visit (HOSPITAL_COMMUNITY): Payer: Self-pay | Admitting: *Deleted

## 2011-02-06 NOTE — Pre-Procedure Instructions (Signed)
20 Maureen [name]  02/06/2011   Your procedure is scheduled on: Tuesday, January 22nd.  Report to [facility] at 8:00 AM.   Call this number if you have problems the morning of surgery: [phone]   Remember:   Do not eat food:After Midnight.   May have clear liquids: up to 4 Hours before arrival.    Clear liquids include soda, tea, black coffee, apple or grape juice, broth.    Take these medicines the morning of surgery with A SIP OF WATER: Zofran and Oxycodone, if needed.    Do not wear jewelry, make-up or nail polish.  Do not wear lotions, powders, or perfumes. You may wear deodorant.  Do not shave 48 hours prior to surgery.  Do not bring valuables to the hospital.  Contacts, dentures or bridgework may not be worn into surgery.  Leave suitcase in the car. After surgery it may be brought to your room.  For patients admitted to the hospital, checkout time is 11:00 AM the day of discharge.   Patients discharged the day of surgery will not be allowed to drive home.  Name and phone number of your driver: --  Special Instructions: CHG Shower Use Special Wash: 1/2 bottle night before surgery and 1/2 bottle morning of surgery.   Please read over the following fact sheets that you were given: Pain Booklet, Coughing and Deep Breathing, MRSA Information and Surgical Site Infection Prevention

## 2011-02-09 ENCOUNTER — Ambulatory Visit (HOSPITAL_COMMUNITY): Payer: Managed Care, Other (non HMO)

## 2011-02-09 ENCOUNTER — Encounter (HOSPITAL_COMMUNITY): Payer: Self-pay

## 2011-02-09 ENCOUNTER — Other Ambulatory Visit: Payer: Self-pay

## 2011-02-09 LAB — CBC
HCT: 40.9 % (ref 36.0–46.0)
Hemoglobin: 13.9 g/dL (ref 12.0–15.0)
MCH: 32.9 pg (ref 26.0–34.0)
MCHC: 34 g/dL (ref 30.0–36.0)
MCV: 96.7 fL (ref 78.0–100.0)
Platelets: 467 10*3/uL — ABNORMAL HIGH (ref 150–400)
RBC: 4.23 MIL/uL (ref 3.87–5.11)
RDW: 12.5 % (ref 11.5–15.5)
WBC: 12.4 10*3/uL — ABNORMAL HIGH (ref 4.0–10.5)

## 2011-02-09 LAB — SURGICAL PCR SCREEN
MRSA, PCR: NEGATIVE
Staphylococcus aureus: NEGATIVE

## 2011-02-09 LAB — BASIC METABOLIC PANEL
BUN: 9 mg/dL (ref 6–23)
CO2: 29 mEq/L (ref 19–32)
Calcium: 9.3 mg/dL (ref 8.4–10.5)
Chloride: 97 mEq/L (ref 96–112)
Creatinine, Ser: 0.75 mg/dL (ref 0.50–1.10)
GFR calc Af Amer: >90 mL/min (ref >90)
GFR calc non Af Amer: >90 mL/min (ref >90)
Glucose, Bld: 333 mg/dL — ABNORMAL HIGH (ref 70–99)
Potassium: 4 mEq/L (ref 3.5–5.1)
Sodium: 133 mEq/L — ABNORMAL LOW (ref 135–145)

## 2011-02-09 MED ORDER — CIPROFLOXACIN IN D5W 400 MG/200ML IV SOLN: 400.0000 mg | INTRAVENOUS | Status: DC

## 2011-02-09 NOTE — Consult Note (Signed)
Anesthesia:  Patient is a 41 year old female scheduled for a lap cholecystectomy, possible open and liver biopsy.  Her history includes DM on insulin, fibromyalgia, HLD, depression, migraines, PTSD, and "sleep trouble".  Her Gastroenterologist is Dr.Hung, who has requested the liver biopsy due to a history of elevated LFTs.  Preoperative labs are significant for a glucose of 333 (were previously 139, 177 and 156 in 2012).  WBC were 12.4, PLT 467.  LFTs weren't done today, but we do have results that were done within the last 14 days that show a normal AST and ALT of 16 and 26, respectively, on 01/28/11.  Alk Ph was elevated, but stable at 146.  No coags were done.  Since she is having a liver biopsy, I will order a PT/PTT for the day of surgery.  She will also get her glucose rechecked on arrival.  I called her glucose result to [facility], Triage nurse at [facility].  EKG showed NSR, possible anterior infarct, age undetermined.  No CV symptoms reported at PAT. Clinical correlation on the day of surgery.  She had a normal echo on 12/14/05.  CXR on 12/23/10 showed no acute abnormalities.  If follow-up labs reasonable, and she remains asymptomatic from a CV standpoint, then anticipate she can proceed.

## 2011-02-09 NOTE — Pre-Procedure Instructions (Signed)
Maureen [name]  02/09/2011   Your procedure is scheduled on: Tuesday, January 22nd.  Report to [facility] at 8:00 AM.   Call this number if you have problems the morning of surgery: [phone]   Remember:   Do not eat food:After Midnight.   May have clear liquids: up to 4 Hours before arrival.    Clear liquids include soda, tea, black coffee, apple or grape juice, broth.    Take these medicines the morning of surgery with A SIP OF WATER: Zofran and Oxycodone, if needed.    Do not wear jewelry, make-up or nail polish.  Do not wear lotions, powders, or perfumes. You may wear deodorant.  Do not shave 48 hours prior to surgery.  Do not bring valuables to the hospital.  Contacts, dentures or bridgework may not be worn into surgery.  Leave suitcase in the car. After surgery it may be brought to your room.  For patients admitted to the hospital, checkout time is 11:00 AM the day of discharge.   Patients discharged the day of surgery will not be allowed to drive home.  Name and phone number of your driver: Maureen [name]  [phone]  Special Instructions: CHG Shower Use Special Wash: 1/2 bottle night before surgery and 1/2 bottle morning of surgery.   Please read over the following fact sheets that you were given: Pain Booklet, Coughing and Deep Breathing, MRSA Information and Surgical Site Infection Prevention

## 2011-02-10 ENCOUNTER — Encounter (HOSPITAL_COMMUNITY)
Admission: RE | Disposition: A | Discharge status: Home / Self Care | Payer: Self-pay | Source: Ambulatory Visit | Attending: General Surgery

## 2011-02-10 ENCOUNTER — Encounter (HOSPITAL_COMMUNITY): Payer: Self-pay | Admitting: Vascular Surgery

## 2011-02-10 ENCOUNTER — Encounter (HOSPITAL_COMMUNITY): Payer: Self-pay | Admitting: *Deleted

## 2011-02-10 ENCOUNTER — Encounter (HOSPITAL_COMMUNITY): Payer: Self-pay | Admitting: Anesthesiology

## 2011-02-10 ENCOUNTER — Other Ambulatory Visit (INDEPENDENT_AMBULATORY_CARE_PROVIDER_SITE_OTHER): Payer: Self-pay | Admitting: General Surgery

## 2011-02-10 ENCOUNTER — Ambulatory Visit (HOSPITAL_COMMUNITY)
Admission: RE | Admit: 2011-02-10 | Discharge: 2011-02-10 | Disposition: A | Discharge status: Home / Self Care | Payer: Managed Care, Other (non HMO) | Source: Ambulatory Visit | Attending: General Surgery | Admitting: General Surgery

## 2011-02-10 ENCOUNTER — Ambulatory Visit (HOSPITAL_COMMUNITY): Payer: Managed Care, Other (non HMO) | Admitting: Vascular Surgery

## 2011-02-10 ENCOUNTER — Ambulatory Visit (HOSPITAL_COMMUNITY): Payer: Managed Care, Other (non HMO)

## 2011-02-10 DIAGNOSIS — R112 Nausea with vomiting, unspecified: Secondary | ICD-10-CM | POA: Insufficient documentation

## 2011-02-10 DIAGNOSIS — R945 Abnormal results of liver function studies: Secondary | ICD-10-CM

## 2011-02-10 DIAGNOSIS — R109 Unspecified abdominal pain: Secondary | ICD-10-CM | POA: Insufficient documentation

## 2011-02-10 DIAGNOSIS — K811 Chronic cholecystitis: Secondary | ICD-10-CM

## 2011-02-10 DIAGNOSIS — K828 Other specified diseases of gallbladder: Secondary | ICD-10-CM | POA: Insufficient documentation

## 2011-02-10 DIAGNOSIS — Z01812 Encounter for preprocedural laboratory examination: Secondary | ICD-10-CM | POA: Insufficient documentation

## 2011-02-10 DIAGNOSIS — IMO0001 Reserved for inherently not codable concepts without codable children: Secondary | ICD-10-CM | POA: Insufficient documentation

## 2011-02-10 DIAGNOSIS — E785 Hyperlipidemia, unspecified: Secondary | ICD-10-CM | POA: Insufficient documentation

## 2011-02-10 DIAGNOSIS — E119 Type 2 diabetes mellitus without complications: Secondary | ICD-10-CM | POA: Insufficient documentation

## 2011-02-10 HISTORY — DX: Pain in unspecified joint: M25.50

## 2011-02-10 HISTORY — DX: Sleep disorder, unspecified: G47.9

## 2011-02-10 HISTORY — DX: Depression, unspecified: F32.A

## 2011-02-10 HISTORY — DX: Post-traumatic stress disorder, unspecified: F43.10

## 2011-02-10 HISTORY — DX: Major depressive disorder, single episode, unspecified: F32.9

## 2011-02-10 HISTORY — PX: CHOLECYSTECTOMY: SHX55

## 2011-02-10 HISTORY — DX: Rash and other nonspecific skin eruption: R21

## 2011-02-10 LAB — GLUCOSE, CAPILLARY
Glucose-Capillary: 119 mg/dL — ABNORMAL HIGH (ref 70–99)
Glucose-Capillary: 243 mg/dL — ABNORMAL HIGH (ref 70–99)
Glucose-Capillary: 306 mg/dL — ABNORMAL HIGH (ref 70–99)

## 2011-02-10 LAB — PROTIME-INR
INR: 0.89 (ref 0.00–1.49)
Prothrombin Time: 12.2 seconds (ref 11.6–15.2)

## 2011-02-10 LAB — APTT: aPTT: 28 seconds (ref 24–37)

## 2011-02-10 SURGERY — LAPAROSCOPIC CHOLECYSTECTOMY WITH INTRAOPERATIVE CHOLANGIOGRAM
Anesthesia: General | Site: Abdomen | Wound class: Clean Contaminated

## 2011-02-10 MED ORDER — MIDAZOLAM HCL 5 MG/5ML IJ SOLN
INTRAMUSCULAR | Status: DC | PRN
  Administered 2011-02-10: 1 mg via INTRAVENOUS

## 2011-02-10 MED ORDER — CIPROFLOXACIN IN D5W 400 MG/200ML IV SOLN
400.0000 mg | INTRAVENOUS | Status: AC
  Administered 2011-02-10: 400 mg via INTRAVENOUS
  Filled 2011-02-10: qty 200

## 2011-02-10 MED ORDER — LABETALOL HCL 5 MG/ML IV SOLN
INTRAVENOUS | Status: DC | PRN
  Administered 2011-02-10: 5 mg via INTRAVENOUS

## 2011-02-10 MED ORDER — LACTATED RINGERS IV SOLN
INTRAVENOUS | Status: DC | PRN
  Administered 2011-02-10 (×2): via INTRAVENOUS

## 2011-02-10 MED ORDER — LIDOCAINE HCL 4 % MT SOLN
OROMUCOSAL | Status: DC | PRN
  Administered 2011-02-10: 4 mL via TOPICAL

## 2011-02-10 MED ORDER — 0.9 % SODIUM CHLORIDE (POUR BTL) OPTIME
TOPICAL | Status: DC | PRN
  Administered 2011-02-10: 1000 mL

## 2011-02-10 MED ORDER — IOHEXOL 300 MG/ML  SOLN
INTRAMUSCULAR | Status: DC | PRN
  Administered 2011-02-10: 12 mL

## 2011-02-10 MED ORDER — FENTANYL CITRATE 0.05 MG/ML IJ SOLN
INTRAMUSCULAR | Status: DC | PRN
  Administered 2011-02-10 (×3): 100 ug via INTRAVENOUS

## 2011-02-10 MED ORDER — PROPOFOL 10 MG/ML IV EMUL
INTRAVENOUS | Status: DC | PRN
  Administered 2011-02-10: 200 mg via INTRAVENOUS

## 2011-02-10 MED ORDER — LIDOCAINE-EPINEPHRINE 1 %-1:100000 IJ SOLN
INTRAMUSCULAR | Status: DC | PRN
  Administered 2011-02-10: 17 mL

## 2011-02-10 MED ORDER — SODIUM CHLORIDE 0.9 % IR SOLN
Status: DC | PRN
  Administered 2011-02-10: 1

## 2011-02-10 MED ORDER — ROCURONIUM BROMIDE 100 MG/10ML IV SOLN
INTRAVENOUS | Status: DC | PRN
  Administered 2011-02-10 (×2): 10 mg via INTRAVENOUS
  Administered 2011-02-10: 30 mg via INTRAVENOUS

## 2011-02-10 MED ORDER — OXYCODONE HCL 5 MG PO TABS: 5.0000 mg | ORAL_TABLET | ORAL | Status: AC | PRN

## 2011-02-10 MED ORDER — NEOSTIGMINE METHYLSULFATE 1 MG/ML IJ SOLN
INTRAMUSCULAR | Status: DC | PRN
  Administered 2011-02-10: 4 mg via INTRAVENOUS

## 2011-02-10 MED ORDER — HYDROMORPHONE HCL PF 1 MG/ML IJ SOLN
0.2500 mg | INTRAMUSCULAR | Status: DC | PRN
  Administered 2011-02-10 (×4): 0.5 mg via INTRAVENOUS

## 2011-02-10 MED ORDER — BUPIVACAINE HCL (PF) 0.25 % IJ SOLN
INTRAMUSCULAR | Status: DC | PRN
  Administered 2011-02-10: 17 mL

## 2011-02-10 MED ORDER — ONDANSETRON HCL 4 MG/2ML IJ SOLN: 4.0000 mg | Freq: Once | INTRAMUSCULAR | Status: DC | PRN

## 2011-02-10 MED ORDER — HEMOSTATIC AGENTS (NO CHARGE) OPTIME
TOPICAL | Status: DC | PRN
  Administered 2011-02-10: 1 via TOPICAL

## 2011-02-10 MED ORDER — LIDOCAINE HCL (CARDIAC) 20 MG/ML IV SOLN
INTRAVENOUS | Status: DC | PRN
  Administered 2011-02-10: 40 mg via INTRAVENOUS

## 2011-02-10 MED ORDER — GLYCOPYRROLATE 0.2 MG/ML IJ SOLN
INTRAMUSCULAR | Status: DC | PRN
  Administered 2011-02-10: .6 mg via INTRAVENOUS

## 2011-02-10 MED ORDER — HYDROMORPHONE HCL PF 1 MG/ML IJ SOLN
INTRAMUSCULAR | Status: AC
  Filled 2011-02-10: qty 1

## 2011-02-10 MED ORDER — OXYCODONE HCL 5 MG PO TABS
5.0000 mg | ORAL_TABLET | Freq: Once | ORAL | Status: AC
  Administered 2011-02-10: 5 mg via ORAL

## 2011-02-10 MED ORDER — ONDANSETRON HCL 4 MG/2ML IJ SOLN
INTRAMUSCULAR | Status: DC | PRN
  Administered 2011-02-10: 4 mg via INTRAVENOUS

## 2011-02-10 MED ORDER — OXYCODONE HCL 5 MG PO TABS
ORAL_TABLET | ORAL | Status: AC
  Filled 2011-02-10: qty 1

## 2011-02-10 SURGICAL SUPPLY — 54 items
APPLIER CLIP ROT 10 11.4 M/L (STAPLE) ×2
BLADE SURG ROTATE 9660 (MISCELLANEOUS) IMPLANT
CANISTER SUCTION 2500CC (MISCELLANEOUS) ×2 IMPLANT
CATH REDDICK CHOLANGI 4FR 50CM (CATHETERS) ×2 IMPLANT
CHLORAPREP W/TINT 26ML (MISCELLANEOUS) ×2 IMPLANT
CLIP APPLIE ROT 10 11.4 M/L (STAPLE) ×1 IMPLANT
CLOTH BEACON ORANGE TIMEOUT ST (SAFETY) ×2 IMPLANT
COVER SURGICAL LIGHT HANDLE (MISCELLANEOUS) ×4 IMPLANT
DECANTER SPIKE VIAL GLASS SM (MISCELLANEOUS) ×4 IMPLANT
DERMABOND ADVANCED (GAUZE/BANDAGES/DRESSINGS)
DERMABOND ADVANCED .7 DNX12 (GAUZE/BANDAGES/DRESSINGS) IMPLANT
DRAPE C-ARM 42X72 X-RAY (DRAPES) ×2 IMPLANT
ELECT CAUTERY BLADE 6.4 (BLADE) ×2 IMPLANT
ELECT REM PT RETURN 9FT ADLT (ELECTROSURGICAL) ×2
ELECTRODE REM PT RTRN 9FT ADLT (ELECTROSURGICAL) ×1 IMPLANT
GLOVE BIO SURGEON STRL SZ7 (GLOVE) ×2 IMPLANT
GLOVE BIO SURGEON STRL SZ7.5 (GLOVE) ×2 IMPLANT
GLOVE BIO SURGEON STRL SZ8 (GLOVE) ×2 IMPLANT
GLOVE BIOGEL PI IND STRL 6.5 (GLOVE) ×1 IMPLANT
GLOVE BIOGEL PI IND STRL 7.0 (GLOVE) ×1 IMPLANT
GLOVE BIOGEL PI IND STRL 7.5 (GLOVE) ×1 IMPLANT
GLOVE BIOGEL PI IND STRL 8 (GLOVE) ×1 IMPLANT
GLOVE BIOGEL PI INDICATOR 6.5 (GLOVE) ×1
GLOVE BIOGEL PI INDICATOR 7.0 (GLOVE) ×1
GLOVE BIOGEL PI INDICATOR 7.5 (GLOVE) ×1
GLOVE BIOGEL PI INDICATOR 8 (GLOVE) ×1
GLOVE ECLIPSE 6.5 STRL STRAW (GLOVE) ×2 IMPLANT
GLOVE SURG SS PI 6.5 STRL IVOR (GLOVE) ×2 IMPLANT
GLOVE SURG SS PI 7.5 STRL IVOR (GLOVE) ×4 IMPLANT
GOWN PREVENTION PLUS XLARGE (GOWN DISPOSABLE) ×2 IMPLANT
GOWN STRL NON-REIN LRG LVL3 (GOWN DISPOSABLE) ×4 IMPLANT
HEMOSTAT NU-KNIT SURGICAL 3X4 (HEMOSTASIS) ×2 IMPLANT
IV CATH 14GX2 1/4 (CATHETERS) ×2 IMPLANT
KIT BASIN OR (CUSTOM PROCEDURE TRAY) ×2 IMPLANT
KIT ROOM TURNOVER OR (KITS) ×2 IMPLANT
NS IRRIG 1000ML POUR BTL (IV SOLUTION) ×2 IMPLANT
PAD ARMBOARD 7.5X6 YLW CONV (MISCELLANEOUS) ×4 IMPLANT
PENCIL BUTTON HOLSTER BLD 10FT (ELECTRODE) ×2 IMPLANT
POUCH SPECIMEN RETRIEVAL 10MM (ENDOMECHANICALS) ×2 IMPLANT
SCISSORS LAP 5X35 DISP (ENDOMECHANICALS) IMPLANT
SET CHOLANGIOGRAPH 5 50 .035 (SET/KITS/TRAYS/PACK) ×2 IMPLANT
SET IRRIG TUBING LAPAROSCOPIC (IRRIGATION / IRRIGATOR) ×2 IMPLANT
SLEEVE ENDOPATH XCEL 5M (ENDOMECHANICALS) ×2 IMPLANT
SPECIMEN JAR SMALL (MISCELLANEOUS) ×2 IMPLANT
SUT MNCRL AB 4-0 PS2 18 (SUTURE) ×4 IMPLANT
SUT VICRYL 0 UR6 27IN ABS (SUTURE) ×4 IMPLANT
TOWEL OR 17X24 6PK STRL BLUE (TOWEL DISPOSABLE) ×2 IMPLANT
TOWEL OR 17X26 10 PK STRL BLUE (TOWEL DISPOSABLE) ×2 IMPLANT
TRAY FOLEY CATH 14FR (SET/KITS/TRAYS/PACK) IMPLANT
TRAY LAPAROSCOPIC (CUSTOM PROCEDURE TRAY) ×2 IMPLANT
TROCAR HASSON GELL 12X100 (TROCAR) ×2 IMPLANT
TROCAR XCEL NON-BLD 11X100MML (ENDOMECHANICALS) ×2 IMPLANT
TROCAR XCEL NON-BLD 5MMX100MML (ENDOMECHANICALS) ×2 IMPLANT
WATER STERILE IRR 1000ML POUR (IV SOLUTION) IMPLANT

## 2011-02-10 NOTE — H&P (View-Only) (Signed)
Patient ID: Maureen [name], female   DOB: May 19, 1970, 41 y.o.   MRN: [id number]  Chief Complaint  Patient presents with  . Other    Eval biliary dyskinesia    HPI Maureen [name] is a 41 y.o. female.  The patient is referred by Dr. Elnoria Howard for evaluation of abdominal pain, nausea and vomiting and elevated LFTs. She has been evaluated by her primary physician and by Dr. Elnoria Howard for some time working up the elevated LFTs. She states that she has nausea and vomiting and right-sided flank pain with some radiation to the front which she described as constant and "kicking" pain. She states that this is worse after eating fatty foods or creamy foods or fried foods. She states that this has been on-and-off for several years but has been pretty much nonstop since mid-December. She has lost 10 pounds over the last month due to her fear of eating. She had an EGD and a colonoscopy 6 years ago for burning chest pain and bloody stools but this was felt to be hemorrhoids and no other obvious pathology was identified per the patient report. She has had an ultrasound which demonstrated no gallstones. She had a HIDA scan which demonstrated a 53% ejection fraction. She also has elevated LFTs but hepatitis workup has been negative. She takes oxycodone daily for relief of this discomfort. HPI  Past Medical History  Diagnosis Date  . Diabetes mellitus   . Fibromyalgia   . Arthritis   . Hyperlipidemia   . Chills   . Fever   . Abdominal pain   . Nausea & vomiting   . Biliary dyskinesia     Past Surgical History  Procedure Date  . Tonsillectomy 2001  . Lipoma removed   . Knee surgery   . Cesarean section 1997, 1999  . Abdominal hysterectomy 2001  . Tumor removal 2000    lower back    Family History  Problem Relation Age of Onset  . Cancer Father     lymphoma  . Cancer Maternal Grandmother     colon    Social History History  Substance Use Topics  . Smoking status: Never Smoker   . Smokeless  tobacco: Never Used  . Alcohol Use: No    Allergies  Allergen Reactions  . Ibuprofen Nausea And Vomiting  . Tylenol (Acetaminophen) Other (See Comments)    Liver enzymes elevated.  Marland Kitchen Keflex Rash    Current Outpatient Prescriptions  Medication Sig Dispense Refill  . carisoprodol (SOMA) 350 MG tablet as needed.      Marland Kitchen imipramine (TOFRANIL-PM) 100 MG capsule Daily.      . citalopram (CELEXA) 40 MG tablet Take 40 mg by mouth daily.        . insulin aspart (NOVOLOG) 100 UNIT/ML injection Inject 8 Units into the skin 3 (three) times daily before meals.        . insulin glargine (LANTUS) 100 UNIT/ML injection Inject 28 Units into the skin at bedtime.        . ondansetron (ZOFRAN ODT) 8 MG disintegrating tablet Take 1 tablet (8 mg total) by mouth every 8 (eight) hours as needed for nausea.  20 tablet  0  . oxycodone (OXY-IR) 5 MG capsule Take 1 capsule (5 mg total) by mouth every 4 (four) hours as needed. For pain  16 capsule  0    Review of Systems Review of Systems All other review of systems negative or noncontributory except as stated in the HPI  Blood pressure  130/76, pulse 68, temperature 98.2 F (36.8 C), temperature source Temporal, resp. rate 18, height 5\' 4"  (1.626 m), weight 179 lb 6.4 oz (81.375 kg).  Physical Exam Physical Exam Physical Exam  Vitals reviewed. Constitutional: He is oriented to person, place, and time. He appears well-developed and well-nourished. No distress.  HENT:  Head: Normocephalic and atraumatic.  Mouth/Throat: No oropharyngeal exudate.  Eyes: Conjunctivae and EOM are normal. Pupils are equal, round, and reactive to light. Right eye exhibits no discharge. Left eye exhibits no discharge. No scleral icterus.  Neck: Normal range of motion. No tracheal deviation present.  Cardiovascular: Normal rate, regular rhythm and normal heart sounds.   Pulmonary/Chest: Effort normal and breath sounds normal. No stridor. No respiratory distress. He has no wheezes.  He has no rales. He exhibits no tenderness.  Abdominal: Soft. Bowel sounds are normal. He exhibits no distension and no mass. There is no tenderness. There is no rebound and no guarding.  Musculoskeletal: Normal range of motion. He exhibits no edema and no tenderness.  Neurological: He is alert and oriented to person, place, and time.  Skin: Skin is warm and dry. No rash noted. He is not diaphoretic. No erythema. No pallor.  Psychiatric: He has a normal mood and affect. His behavior is normal. Judgment and thought content normal.    Data Reviewed [place], [place], I have personally discussed this case with Dr. Elnoria Howard  Assessment    Right-sided abdominal pain of uncertain etiology. I have discussed this case with her gastroenterologist and he is concerned that this may be due to biliary dyskinesia or due to sphincter of Odi dysfunction. I had a long discussion with the patient regarding the options of continued observation versus cholecystectomy versus ERCP for treatment of this problem. I am not convinced that this is due to her gallbladder although she does have some symptoms which certainly could be due to this given her postprandial pain and nausea. We discussed the option for cholecystectomy and I explained that we did not know if this will fix her problem and would really be 50-50 chance of success. I explained that her biggest risk of the surgery would be that she has persistent symptoms. We also discussed the risk of diarrhea, bile duct injury, need for open surgery, infection, bleeding, pain, scarring, need for future surgery, injury to the bowels and she states understanding and would like to proceed with cholecystectomy. Again, she did express understanding that treatment is based solely on her symptoms and the hoped for relief of her symptoms. Given her LFTs, we will also plan for a liver biopsy as requested by her gastroenterologist at the same time of her procedure.    Plan    We will plan for  fluoroscopic cholecystectomy with cholangiogram and liver biopsy when available.       Lodema Pilot DAVID 02/03/2011, 2:23 PM

## 2011-02-10 NOTE — Preoperative (Signed)
Beta Blockers   Reason not to administer Beta Blockers:Not Applicable 

## 2011-02-10 NOTE — Brief Op Note (Signed)
02/10/2011  12:03 PM  PATIENT:  Coralie Carpen  41 y.o. female  PRE-OPERATIVE DIAGNOSIS:  Gallstones  POST-OPERATIVE DIAGNOSIS:  Gallstones  PROCEDURE:  Procedure(s): LAPAROSCOPIC CHOLECYSTECTOMY WITH INTRAOPERATIVE CHOLANGIOGRAM DIAGNOSTIC LAPAROSCOPIC LIVER BIOPSY  SURGEON:  Surgeon(s): Rulon Abide, DO  PHYSICIAN ASSISTANT:   ASSISTANTS: none   ANESTHESIA:   general  EBL:  Total I/O In: 1000 [I.V.:1000] Out: 30 [Blood:30]  BLOOD ADMINISTERED:none  DRAINS: none   LOCAL MEDICATIONS USED:  MARCAINE 17CC and LIDOCAINE 17CC  SPECIMEN:  Source of Specimen:  gallbladder and liver wedge biopsy  DISPOSITION OF SPECIMEN:  PATHOLOGY  COUNTS:  YES  TOURNIQUET:  * No tourniquets in log *  DICTATION: .Other Dictation: Dictation Number [id number]  PLAN OF CARE: Discharge to home after PACU  PATIENT DISPOSITION:  PACU - hemodynamically stable.   Delay start of Pharmacological VTE agent (>24hrs) due to surgical blood loss or risk of bleeding:  {YES/NO/NOT APPLICABLE:20182

## 2011-02-10 NOTE — Transfer of Care (Signed)
Immediate Anesthesia Transfer of Care Note  Patient: Maureen [name]  Procedure(s) Performed:  LAPAROSCOPIC CHOLECYSTECTOMY WITH INTRAOPERATIVE CHOLANGIOGRAM; DIAGNOSTIC LAPAROSCOPIC LIVER BIOPSY  Patient Location: PACU  Anesthesia Type: General  Level of Consciousness: awake and alert   Airway & Oxygen Therapy: Patient Spontanous Breathing and Patient connected to face mask oxygen  Post-op Assessment: Report given to PACU RN  Post vital signs: Reviewed and stable  Complications: No apparent anesthesia complications

## 2011-02-10 NOTE — Interval H&P Note (Signed)
History and Physical Interval Note:  02/10/2011 9:37 AM  Maureen [name]  has presented today for surgery, with the diagnosis of Gallstones.  The various methods of treatment have been discussed with the patient and family. After consideration of risks, benefits and other options for treatment, the patient has consented to  Procedure(s): LAPAROSCOPIC CHOLECYSTECTOMY WITH INTRAOPERATIVE CHOLANGIOGRAM as a surgical intervention .  The patients' history has been reviewed, patient examined, no change in status, stable for surgery.  I have reviewed the patients' chart and labs.  Questions were answered to the patient's satisfaction.  The risks of infection, bleeding, pain, persistent symptoms, scarring, injury to bowel or bile ducts, retained stone, diarrhea, need for additional procedures, and need for open surgery discussed with the patient. I explained that her biggest risk is persistent symptoms given the negative workup to date.  I also explained that since we are adding a liver biopsy, her risk of bleeding is increased as well.  She desires to proceed with lap chole, IOC, and liver biopsy.    Lodema Pilot DAVID

## 2011-02-10 NOTE — Anesthesia Preprocedure Evaluation (Signed)
Anesthesia Evaluation  Patient identified by MRN, date of birth, ID band Patient awake    Reviewed: Allergy & Precautions, H&P , NPO status , Patient's Chart, lab work & pertinent test results, Unable to perform ROS - Chart review only  History of Anesthesia Complications (+) AWARENESS UNDER ANESTHESIA  Airway Mallampati: I TM Distance: >3 FB Neck ROM: full    Dental  (+) Teeth Intact   Pulmonary          Cardiovascular Exercise Tolerance: Good regular Normal    Neuro/Psych  Headaches,  Neuromuscular disease    GI/Hepatic negative GI ROS, Neg liver ROS,   Endo/Other  Well Controlled, Type 1, Insulin Dependent  Renal/GU negative Renal ROS  Genitourinary negative   Musculoskeletal   Abdominal   Peds  Hematology negative hematology ROS (+)   Anesthesia Other Findings   Reproductive/Obstetrics                           Anesthesia Physical Anesthesia Plan  ASA: III  Anesthesia Plan: General   Post-op Pain Management:    Induction: Intravenous  Airway Management Planned: Oral ETT  Additional Equipment:   Intra-op Plan:   Post-operative Plan: Extubation in OR  Informed Consent: I have reviewed the patients History and Physical, chart, labs and discussed the procedure including the risks, benefits and alternatives for the proposed anesthesia with the patient or authorized representative who has indicated his/her understanding and acceptance.     Plan Discussed with: CRNA, Anesthesiologist and Surgeon  Anesthesia Plan Comments:         Anesthesia Quick Evaluation

## 2011-02-10 NOTE — Progress Notes (Signed)
Called Dr. Biagio Quint and reported pt's CBG of 306.   Dr. Biagio Quint okay with pt being discharged and for pt to cover herself at home if that is what the pt requests.  I spoke with the pt and she is comfortable covering her blood sugar at home once she is able to eat.  She stated that she would recheck her sugar once she got home.  Reviewed pt D/C instructions with her and she verbalized understanding. Pt d/c via wheelchair with family.

## 2011-02-10 NOTE — Op Note (Signed)
NAME:  Maureen [name], Maureen [name] NO.:  [id number]  MEDICAL RECORD NO.:  [id number]  LOCATION:  [facility]                         FACILITY:  [facility]  PHYSICIAN:  Lodema Pilot, MD       DATE OF BIRTH:  [birth year]  DATE OF PROCEDURE:  02/10/2011 DATE OF DISCHARGE:  02/10/2011                              OPERATIVE REPORT   PROCEDURE:  Laparoscopic cholecystectomy with intraoperative cholangiogram and liver biopsy.  PREOPERATIVE DIAGNOSES:  Abdominal pain, nausea, and vomiting.  POSTOP DIAGNOSIS:  Abdominal pain, nausea, and vomiting.  SURGEON:  Lodema Pilot, MD  ASSISTANT:  None.  ANESTHESIA:  General endotracheal tube anesthesia with 34 mL of 1% lidocaine with epinephrine and 0.25% Marcaine in a 50:50 mixture.  FLUIDS:  1500 mL of crystalloid.  ESTIMATED BLOOD LOSS:  Minimal.  DRAINS:  None.  SPECIMENS: 1. Gallbladder and contents sent to Pathology for permanent     sectioning. 2. Liver biopsy.  This was sent to Pathology for permanent pathology.  FINDINGS:  Normal-appearing gallbladder and anatomy.  No evidence of cholelithiasis.  Normal-appearing liver, some lower abdominal pelvic adhesions, but no other intra-abdominal pathologies identified.  Normal cholangiogram.  Surgicel gauze left in the gallbladder fossa and at the liver biopsy site.  INDICATION FOR PROCEDURE:  Ms. Mchale is a 41 year old female with nausea, vomiting, and abdominal pain.  Her workup has been negative including ultrasound and HIDA scan.  However, she has been followed by Dr. Elnoria Howard, her gastroenterologist for evaluation of this, and he was concerned that this might be her symptoms may be due to gallbladder or sphincter of Oddi dysfunction.  She was offered cholecystectomy or ERCP, and she elected for cholecystectomy.  She had elevated LFTs as reason for her liver biopsy.  OPERATIVE DETAILS:  Ms. Gaultney was seen and evaluated in the preoperative area, and risks and benefits of procedure  were again discussed in lay terms.  Informed consent was obtained.  I again discussed with her the risk of persistent symptoms and especially given the negative workup to this day and discussed the risk of bleeding from liver biopsy and she expressed understanding and desire to proceed, prophylactic antibiotics were given, and she was taken to the operating room, placed on table in supine position and general endotracheal anesthesia was obtained.  Her abdomen was prepped and draped in a standard surgical fashion, and procedure time-out was performed with all operative team members to confirm proper patient and procedure.  A semicircular infraumbilical incision was made in the skin and dissection carried down through the subcutaneous tissue using blunt dissection. The umbilical stalk was elevated and sharply incised, and the peritoneum entered bluntly.  An 0 Vicryl suture was placed for a stay stitch, and a Hasson trocar was placed.  An 11-mm epigastric trocar was placed under direct visualization.  Two 5-mm right lateral abdominal trocars were placed, and the gallbladder was retracted cephalad.  She had some adhesions from the colon to the gallbladder, which were taken down with sharp dissection.  No cautery was used for the adhesiolysis.  This appeared to be fairly thin adhesions, although colon was nearby, was not immediately stuck to the gallbladder.  After the gallbladder was freed of  the peritoneal adhesions, the Hartmann's pouch was retracted laterally, and she had fairly clear anatomy with a single cystic artery coursing up onto the gallbladder in its usual medial anatomic position. This was skeletonized and divided between hemoclips, and this opened up further the triangle of Calot, and I was able to visualize a single cystic duct coursing into the gallbladder and liver parenchyma visualized through the triangle of Calot.  A clip was placed on the gallbladder side, and a  cholangiogram catheter was placed through a separate stab incision through the abdominal wall, and a small cystic ductotomy was made, and the cholangiogram catheter placed in the cystic duct and cholangiogram was performed, which demonstrated normal cystic duct with no filling defects in the common bile duct, and normal right and left hepatic ducts, and free flow of bile into the duodenum. Catheters were removed, and two clips were placed on the stay side of the cystic duct, and the duct was transected.  Then, the gallbladder was removed from the gallbladder fossa using Bovie electrocautery, and the gallbladder was not entered during the dissection.  Gallbladder was completely removed and placed in an EndoCatch bag.  The gallbladder fossa was inspected for hemostasis, and hemostasis was obtained with Bovie electrocautery from the venous bleeding from the gallbladder fossa.  After the gallbladder fossa was noted be hemostatic, liver biopsy was performed.  At the edge of the liver near the gallbladder fossa, the edge of the liver was elevated and took a wedge of liver from the edge of the liver with sharp dissection and removed, and passed off the table and sent to Pathology for permanent sectioning.  Then, hemostasis obtained with Bovie electrocautery at the cut edge of the liver, which is noted to be adequate.  The gallbladder fossa was again noted to be hemostatic.  The right upper quadrant was irrigated with sterile saline solution until the irrigation returned clear, and a Surgicel gauze was placed in the gallbladder fossa as well at the wedge biopsy site of the liver.  The gallbladder was removed from the umbilical trocar site, and there was no palpable stones, and the gallbladder was sent to Pathology for permanent sectioning.  The umbilical fascia was approximated with interrupted 0 Vicryl sutures, and the abdomen was re-insufflated with carbon dioxide gas to the epigastric trocar  site, and the umbilical closure was noted be adequate without any evidence of bowel injury.  The right upper quadrant appeared hemostatic with no evidence of bile or bowel injury as well.  The 5-mm trocars were removed under direct visualization and abdomen also noted to be hemostatic.  The gas was removed, and the epigastric trocar was removed. The wounds were injected with a total of 34 mL of 1% lidocaine with epinephrine, 0.25% Marcaine in a 50:50 mixture, and the skin edges were approximated with 4-0 Monocryl subcuticular suture.  Skin was washed and dried and Dermabond was applied.  All sponge, needle, and instrument counts were correct at the end of the case.  The patient tolerated the procedure well without apparent complications.          ______________________________ Lodema Pilot, MD     BL/MEDQ  D:  02/10/2011  T:  02/10/2011  Job:  [id number]

## 2011-02-10 NOTE — Anesthesia Postprocedure Evaluation (Signed)
  Anesthesia Post-op Note  Patient: Maureen [name]  Procedure(s) Performed:  LAPAROSCOPIC CHOLECYSTECTOMY WITH INTRAOPERATIVE CHOLANGIOGRAM; DIAGNOSTIC LAPAROSCOPIC LIVER BIOPSY  Patient Location: PACU  Anesthesia Type: General  Level of Consciousness: awake, oriented, sedated and patient cooperative  Airway and Oxygen Therapy: Patient Spontanous Breathing and Patient connected to nasal cannula oxygen  Post-op Pain: mild  Post-op Assessment: Post-op Vital signs reviewed, Patient's Cardiovascular Status Stable, Respiratory Function Stable, Patent Airway, No signs of Nausea or vomiting and Pain level controlled  Post-op Vital Signs: stable  Complications: No apparent anesthesia complications

## 2011-02-11 ENCOUNTER — Encounter (HOSPITAL_COMMUNITY): Payer: Self-pay | Admitting: General Surgery

## 2011-02-12 ENCOUNTER — Telehealth (INDEPENDENT_AMBULATORY_CARE_PROVIDER_SITE_OTHER): Payer: Self-pay | Admitting: General Surgery

## 2011-02-12 NOTE — Telephone Encounter (Signed)
Patient called status post lap chole on Tuesday. Doing okay, but states when she takes the pain medicine it is only lasting for about an hour before she starts having pain again. She is taking one oxycodone 5 mg every 4 hours. She can not take tylenol or ibuprofen due to elevated liver enzymes. I advised patient to try to take two of her pills every 4 hours to see if that helps with the pain. Patient was instructed that if that does not help to call us back. She agrees with this plan and will call back if needed.

## 2011-02-18 ENCOUNTER — Telehealth (INDEPENDENT_AMBULATORY_CARE_PROVIDER_SITE_OTHER): Payer: Self-pay | Admitting: General Surgery

## 2011-02-19 ENCOUNTER — Telehealth (INDEPENDENT_AMBULATORY_CARE_PROVIDER_SITE_OTHER): Payer: Self-pay

## 2011-02-19 NOTE — Telephone Encounter (Signed)
Patient called requesting a refill on her pain medication, she need's to be seen per Dr. Biagio Quint.  Pt scheduled w/Dr. Biagio Quint on 02/20/11 @ 9:15.

## 2011-02-19 NOTE — Telephone Encounter (Signed)
Spoke to patient she's having nausea, vomiting, fever and pain.  Scheduled appointment for 02/20/11 @ 9:15 w/Dr. Biagio Quint.  Patient states Dr. Elnoria Howard called with pathology results from liver biopsy.

## 2011-02-20 ENCOUNTER — Encounter (INDEPENDENT_AMBULATORY_CARE_PROVIDER_SITE_OTHER): Payer: Self-pay | Admitting: General Surgery

## 2011-02-20 ENCOUNTER — Ambulatory Visit (INDEPENDENT_AMBULATORY_CARE_PROVIDER_SITE_OTHER): Payer: Managed Care, Other (non HMO) | Admitting: General Surgery

## 2011-02-20 VITALS — BP 132/80 | HR 104 | Temp 97.3°F | Ht 64.0 in | Wt 176.0 lb

## 2011-02-20 DIAGNOSIS — Z4889 Encounter for other specified surgical aftercare: Secondary | ICD-10-CM

## 2011-02-20 LAB — CBC WITH DIFFERENTIAL/PLATELET
Basophils Absolute: 0 10*3/uL (ref 0.0–0.1)
Basophils Relative: 0 % (ref 0–1)
Eosinophils Absolute: 0.2 10*3/uL (ref 0.0–0.7)
Eosinophils Relative: 1 % (ref 0–5)
HCT: 43.7 % (ref 36.0–46.0)
Hemoglobin: 14.7 g/dL (ref 12.0–15.0)
Lymphocytes Relative: 17 % (ref 12–46)
Lymphs Abs: 2.1 10*3/uL (ref 0.7–4.0)
MCH: 32.7 pg (ref 26.0–34.0)
MCHC: 33.6 g/dL (ref 30.0–36.0)
MCV: 97.3 fL (ref 78.0–100.0)
Monocytes Absolute: 0.6 10*3/uL (ref 0.1–1.0)
Monocytes Relative: 5 % (ref 3–12)
Neutro Abs: 9.8 10*3/uL — ABNORMAL HIGH (ref 1.7–7.7)
Neutrophils Relative %: 77 % (ref 43–77)
Platelets: 632 10*3/uL — ABNORMAL HIGH (ref 150–400)
RBC: 4.49 MIL/uL (ref 3.87–5.11)
RDW: 12.5 % (ref 11.5–15.5)
WBC: 12.7 10*3/uL — ABNORMAL HIGH (ref 4.0–10.5)

## 2011-02-20 MED ORDER — OXYCODONE HCL 5 MG PO TABS: 5.0000 mg | ORAL_TABLET | Freq: Four times a day (QID) | ORAL | Status: DC | PRN

## 2011-02-20 NOTE — Progress Notes (Unsigned)
Subjective:     Patient ID: Maureen [name], female   DOB: [birth year], 41 y.o.   MRN: [id number]  HPI This patient follows up status post microscopic cholecystectomy with intraoperative cholangiogram for suspected biliary dyskinesia. She has had some nausea and vomiting and continued abdominal pain since her surgery as well as some fevers on Wednesday which she measured at 102 but she says she feels fine today. However, she states that she actually feels better since her surgery and she did prior to her surgery. She did have some vomiting after eating some [employer]. And she has been out of her pain medication.  Review of Systems     Objective:   Physical Exam She is in no acute distress and nontoxic-appearing she sitting up comfortably on the table. Her abdominal exam is benign. Her incisions are healing well without sign of infection she has no significant abdominal tenderness or distention on exam today. There's no peritoneal signs and no evidence of any postoperative complication.    Assessment:     Status post arthroscopic cholecystectomy-improving When she really described her symptoms I was very concerning for a possible postoperative complication. However, she says that she feels better actually since her surgery. She did prior to her procedure. She is now taken pain medications since Tuesday and today she says that she feels well. I am not convinced that her gallbladder was her only issue since she still is having significant symptoms even though she is saying that she has improved. She is already scheduled to followup with her gastroenterologist for further evaluation and she will likely proceed with ERCP to evaluate for possible sphincter of Odie dysfunction. I have recommended laboratory studies today to ensure that there is no evidence of any postoperative complications although as I stated, she looks very well today. We will check a CBC and liver enzymes and she will follow up  with her gastroenterologist for further evaluation.     Plan:     We will check her laboratory studies today and she will follow up with Dr. Elnoria Howard for further evaluation. She will likely need ERCP to evaluate for sphincter and of Oddi and dysfunction.

## 2011-02-21 LAB — COMPREHENSIVE METABOLIC PANEL
ALT: 16 U/L (ref 0–35)
AST: 14 U/L (ref 0–37)
Albumin: 4.3 g/dL (ref 3.5–5.2)
Alkaline Phosphatase: 144 U/L — ABNORMAL HIGH (ref 39–117)
BUN: 9 mg/dL (ref 6–23)
CO2: 28 mEq/L (ref 19–32)
Calcium: 9.7 mg/dL (ref 8.4–10.5)
Chloride: 96 mEq/L (ref 96–112)
Creat: 0.95 mg/dL (ref 0.50–1.10)
Glucose, Bld: 252 mg/dL — ABNORMAL HIGH (ref 70–99)
Potassium: 4.4 mEq/L (ref 3.5–5.3)
Sodium: 134 mEq/L — ABNORMAL LOW (ref 135–145)
Total Bilirubin: 0.4 mg/dL (ref 0.3–1.2)
Total Protein: 7.5 g/dL (ref 6.0–8.3)

## 2011-02-25 ENCOUNTER — Telehealth (INDEPENDENT_AMBULATORY_CARE_PROVIDER_SITE_OTHER): Payer: Self-pay

## 2011-02-25 NOTE — Telephone Encounter (Signed)
Left message for patient to call our office in the morning regarding her appointment w/Dr. Biagio Quint.

## 2011-02-26 ENCOUNTER — Encounter (INDEPENDENT_AMBULATORY_CARE_PROVIDER_SITE_OTHER): Payer: Managed Care, Other (non HMO) | Admitting: General Surgery

## 2011-03-03 ENCOUNTER — Telehealth (INDEPENDENT_AMBULATORY_CARE_PROVIDER_SITE_OTHER): Payer: Self-pay

## 2011-03-03 NOTE — Telephone Encounter (Signed)
Patient called requesting a refill on her Zofran, she continues to have nausea and states the Zofran seem's to help.  Patient pharmacy [facility] on [address]

## 2011-03-16 ENCOUNTER — Encounter (INDEPENDENT_AMBULATORY_CARE_PROVIDER_SITE_OTHER): Payer: Self-pay

## 2011-04-13 ENCOUNTER — Encounter (HOSPITAL_COMMUNITY): Payer: Self-pay

## 2011-04-13 ENCOUNTER — Emergency Department (INDEPENDENT_AMBULATORY_CARE_PROVIDER_SITE_OTHER)
Admission: EM | Admit: 2011-04-13 | Discharge: 2011-04-13 | Disposition: A | Discharge status: Home / Self Care | Payer: Managed Care, Other (non HMO) | Source: Home / Self Care | Attending: Family Medicine | Admitting: Family Medicine

## 2011-04-13 DIAGNOSIS — R059 Cough, unspecified: Secondary | ICD-10-CM

## 2011-04-13 DIAGNOSIS — R05 Cough: Secondary | ICD-10-CM

## 2011-04-13 MED ORDER — HYDROCOD POLST-CHLORPHEN POLST 10-8 MG/5ML PO LQCR: 5.0000 mL | Freq: Two times a day (BID) | ORAL | Status: DC | PRN

## 2011-04-13 NOTE — ED Notes (Signed)
Pt. C/o of pain in left ear, sore throat, and cough X2 weeks. States that symptoms got worse last night - "I couldn't stop coughing". Reports low grade fevers 99.9, but currently afebrile. Denies congestion or productive cough.  Has tried hot tea and warm cloth on neck for sore throat with some relief.  Has taken Diabetitussin for cough - last dose yesterday PM.  Denies ear drainage and states that it feels more "itchy" than painful. Uses cuetips to scratch ear.  Reports pressure and pain in head/sinus.

## 2011-04-13 NOTE — ED Provider Notes (Cosign Needed)
History     CSN: [id number]  Arrival date & time 04/13/11  1321   First MD Initiated Contact with Patient 04/13/11 1414      Chief Complaint  Patient presents with  . Sore Throat  . Otalgia    (Consider location/radiation/quality/duration/timing/severity/associated sxs/prior treatment) HPI Comments: Maureen [name] presents for evaluation of persistent cough, and congestion, over the last 2 weeks. She is taking some diabetic cough medicine over-the-counter without much relief. She reports she came in because she an episode of coughing. Last night. That was more severe than the others. Stating that she "could not stop coughing." She denies any high fevers, but does report some low-grade fevers. She cannot take ibuprofen or Tylenol, secondary to some elevated liver enzymes for an unknown reason. This was discovered to her cholecystectomy.  Patient is a 41 y.o. female presenting with cough. The history is provided by the patient.  Cough This is a new problem. The current episode started more than 1 week ago. The problem occurs constantly. The cough is productive of sputum. There has been no fever. Associated symptoms include rhinorrhea. Pertinent negatives include no chest pain, no chills, no sore throat and no shortness of breath. She has tried decongestants and cough syrup for the symptoms. The treatment provided no relief.    Past Medical History  Diagnosis Date  . Diabetes mellitus   . Fibromyalgia   . Arthritis   . Hyperlipidemia   . Chills   . Abdominal pain   . Nausea & vomiting   . Biliary dyskinesia   . Depression   . Migraine   . Joint pain   . Skin rash     due to medications  . Urination, excessive at night   . Post traumatic stress disorder (PTSD)   . Sleep trouble     Past Surgical History  Procedure Date  . Tonsillectomy 2001  . Lipoma removed   . Knee surgery   . Cesarean section 1997, 1999  . Abdominal hysterectomy 2001  . Tumor removal 2000    lower back  .  Eye surgery 2006    laser eye surgery left eye  . Cholecystectomy 02/10/2011    Procedure: LAPAROSCOPIC CHOLECYSTECTOMY WITH INTRAOPERATIVE CHOLANGIOGRAM;  Surgeon: Rulon Abide, DO;  Location: [facility] OR;  Service: General;  Laterality: N/A;    Family History  Problem Relation Age of Onset  . Cancer Father     lymphoma  . Cancer Maternal Grandmother     colon    History  Substance Use Topics  . Smoking status: Never Smoker   . Smokeless tobacco: Never Used  . Alcohol Use: No    OB History    Grav Para Term Preterm Abortions TAB SAB Ect Mult Living                  Review of Systems  Constitutional: Positive for fever. Negative for chills.  HENT: Positive for congestion and rhinorrhea. Negative for sore throat.   Eyes: Negative.   Respiratory: Positive for cough. Negative for shortness of breath.   Cardiovascular: Negative for chest pain.  Gastrointestinal: Negative.   Genitourinary: Negative.   Skin: Negative.   Neurological: Negative.     Allergies  Ibuprofen; Tylenol; and Keflex  Home Medications   Current Outpatient Rx  Name Route Sig Dispense Refill  . INSULIN ASPART 100 UNIT/ML [place] SOLN Subcutaneous Inject 0-5 Units into the skin 3 (three) times daily before meals.     . INSULIN GLARGINE 100  UNIT/ML [place] SOLN Subcutaneous Inject 24 Units into the skin at bedtime.     Marland Kitchen CARISOPRODOL 350 MG PO TABS Oral Take 350 mg by mouth daily as needed. For sleep    . HYDROCOD POLST-CPM POLST ER 10-8 MG/5ML PO LQCR Oral Take 5 mLs by mouth every 12 (twelve) hours as needed. 140 mL 0  . CITALOPRAM HYDROBROMIDE 40 MG PO TABS Oral Take 40 mg by mouth at bedtime.     . IMIPRAMINE PAMOATE 100 MG PO CAPS Oral Take 100 mg by mouth at bedtime.     Marland Kitchen ONDANSETRON HCL 4 MG PO TABS Oral Take 4 mg by mouth every 8 (eight) hours as needed. For nausea      BP 140/73  Pulse 83  Temp(Src) 98.3 F (36.8 C) (Oral)  Resp 14  SpO2 99%  Physical Exam  Nursing note and vitals  reviewed. Constitutional: She is oriented to person, place, and time. She appears well-developed and well-nourished.  HENT:  Head: Normocephalic and atraumatic.  Right Ear: Tympanic membrane normal.  Left Ear: Tympanic membrane normal.  Mouth/Throat: Uvula is midline, oropharynx is clear and moist and mucous membranes are normal.  Eyes: EOM are normal.  Neck: Normal range of motion.  Cardiovascular: Normal rate and regular rhythm.   Pulmonary/Chest: Effort normal and breath sounds normal. She has no decreased breath sounds. She has no wheezes. She has no rhonchi.  Musculoskeletal: Normal range of motion.  Neurological: She is alert and oriented to person, place, and time.  Skin: Skin is warm and dry.  Psychiatric: Her behavior is normal.    ED Course  Procedures (including critical care time)  Labs Reviewed - No data to display No results found.   1. Cough       MDM  Given rx for Tussionex syrup; supportive care        Renaee Munda, MD 04/13/11 1609

## 2011-04-13 NOTE — Discharge Instructions (Signed)
Use an over the counter nasal saline spray as directed for congestion as needed. Use the prescription cough syrup, as needed and as directed. Also, stay hydrated with clear liquids. Return to care should your symptoms not improve, or worsen in any way

## 2011-04-22 ENCOUNTER — Telehealth: Payer: Self-pay

## 2011-04-22 DIAGNOSIS — M797 Fibromyalgia: Secondary | ICD-10-CM

## 2011-04-22 NOTE — Telephone Encounter (Signed)
Pt just got her sed rate back from the rhematologist that we referred her to and she would like to talk with someone here about it since we did send her  Best number [phone]

## 2011-04-22 NOTE — Telephone Encounter (Signed)
Pt states that she had received a message from rheumatologist office that her sed rate was better and that she has fibromyalgia.  They told her to take ibuprofen for her pain and pt cannot take it because it upsets her stomach and vomiting.  She feels that their office is not listening to her or paying attention to her chart.  She would like some explanation (answers) on what is causing her pain.  Will call rheumatologist in the morning to get chart to see where we can go from there.  Chart is at nurses station.

## 2011-04-23 NOTE — Telephone Encounter (Signed)
We have not seen her for this. Not sure what we can offer. Advise her that she can come in for evaluation if she would like, but since we do not have any data on this from our evaluation not sure what we can offer.  Maureen [name]

## 2011-04-23 NOTE — Telephone Encounter (Signed)
SPOKE WITH PT AND SHE WANTS TO BE REFERRED TO [facility]. SHE ALSO WANTS ANOTHER KIND OF MUSCLE RELAXER THAT IS NON-NARCOTIC BECAUSE THE SOMA AND THE SKELAXIN DIDN'T HELP HER. PLEASE ADVISE.

## 2011-04-23 NOTE — Telephone Encounter (Signed)
It looks like pt was diagnosed with fibromyalgia. There are many things that can be done that pt is not currently doing.  If pt was not happy with [facility] and the explaination or care, I would be more than happy to send to [facility] I have had good success with them.  Other options are a different muscle relaxer, nerve pain meds (not narcotics), meds that help with chronic pain syndromes (not narcotics).  Ibuprofen is typically not a meds that we use routinely and esp for her with her allergy that would not be a good option.

## 2011-04-23 NOTE — Telephone Encounter (Signed)
Received notes from Rheumatologist.  Please review and see what we can do for her.

## 2011-04-24 MED ORDER — CYCLOBENZAPRINE HCL 5 MG PO TABS: 5.0000 mg | ORAL_TABLET | Freq: Three times a day (TID) | ORAL | Status: AC | PRN

## 2011-04-24 NOTE — Telephone Encounter (Signed)
Left message on cell of info

## 2011-04-24 NOTE — Telephone Encounter (Signed)
I sent in flexeril - I have many patients on it and it really helps.  I did not give her a full month but if it works and she runs out before she gets an appt let me know I am can refill.  It may make her sleepy, please advise her.

## 2011-05-17 ENCOUNTER — Emergency Department (HOSPITAL_COMMUNITY)
Admission: EM | Admit: 2011-05-17 | Discharge: 2011-05-18 | Disposition: A | Discharge status: Home / Self Care | Payer: Managed Care, Other (non HMO) | Attending: Emergency Medicine | Admitting: Emergency Medicine

## 2011-05-17 ENCOUNTER — Encounter (HOSPITAL_COMMUNITY): Payer: Self-pay | Admitting: Emergency Medicine

## 2011-05-17 ENCOUNTER — Emergency Department (HOSPITAL_COMMUNITY): Payer: Managed Care, Other (non HMO)

## 2011-05-17 DIAGNOSIS — R079 Chest pain, unspecified: Secondary | ICD-10-CM | POA: Insufficient documentation

## 2011-05-17 DIAGNOSIS — E785 Hyperlipidemia, unspecified: Secondary | ICD-10-CM | POA: Insufficient documentation

## 2011-05-17 DIAGNOSIS — R739 Hyperglycemia, unspecified: Secondary | ICD-10-CM

## 2011-05-17 DIAGNOSIS — R0602 Shortness of breath: Secondary | ICD-10-CM | POA: Insufficient documentation

## 2011-05-17 DIAGNOSIS — R7309 Other abnormal glucose: Secondary | ICD-10-CM | POA: Insufficient documentation

## 2011-05-17 DIAGNOSIS — IMO0001 Reserved for inherently not codable concepts without codable children: Secondary | ICD-10-CM | POA: Insufficient documentation

## 2011-05-17 LAB — DIFFERENTIAL
Basophils Absolute: 0.1 10*3/uL (ref 0.0–0.1)
Basophils Relative: 1 % (ref 0–1)
Eosinophils Absolute: 0.2 10*3/uL (ref 0.0–0.7)
Eosinophils Relative: 2 % (ref 0–5)
Lymphocytes Relative: 31 % (ref 12–46)
Lymphs Abs: 4.4 10*3/uL — ABNORMAL HIGH (ref 0.7–4.0)
Monocytes Absolute: 0.7 10*3/uL (ref 0.1–1.0)
Monocytes Relative: 5 % (ref 3–12)
Neutro Abs: 9 10*3/uL — ABNORMAL HIGH (ref 1.7–7.7)
Neutrophils Relative %: 62 % (ref 43–77)

## 2011-05-17 LAB — COMPREHENSIVE METABOLIC PANEL
ALT: 36 U/L — ABNORMAL HIGH (ref 0–35)
AST: 31 U/L (ref 0–37)
Albumin: 3.5 g/dL (ref 3.5–5.2)
Alkaline Phosphatase: 148 U/L — ABNORMAL HIGH (ref 39–117)
BUN: 9 mg/dL (ref 6–23)
CO2: 27 mEq/L (ref 19–32)
Calcium: 9.5 mg/dL (ref 8.4–10.5)
Chloride: 94 mEq/L — ABNORMAL LOW (ref 96–112)
Creatinine, Ser: 0.81 mg/dL (ref 0.50–1.10)
GFR calc Af Amer: >90 mL/min (ref >90)
GFR calc non Af Amer: >90 mL/min (ref >90)
Glucose, Bld: 341 mg/dL — ABNORMAL HIGH (ref 70–99)
Potassium: 3.8 mEq/L (ref 3.5–5.1)
Sodium: 134 mEq/L — ABNORMAL LOW (ref 135–145)
Total Bilirubin: 0.2 mg/dL — ABNORMAL LOW (ref 0.3–1.2)
Total Protein: 7.8 g/dL (ref 6.0–8.3)

## 2011-05-17 LAB — CBC
HCT: 42.3 % (ref 36.0–46.0)
Hemoglobin: 15.2 g/dL — ABNORMAL HIGH (ref 12.0–15.0)
MCH: 33 pg (ref 26.0–34.0)
MCHC: 35.9 g/dL (ref 30.0–36.0)
MCV: 92 fL (ref 78.0–100.0)
Platelets: 392 10*3/uL (ref 150–400)
RBC: 4.6 MIL/uL (ref 3.87–5.11)
RDW: 12.3 % (ref 11.5–15.5)
WBC: 14.4 10*3/uL — ABNORMAL HIGH (ref 4.0–10.5)

## 2011-05-17 LAB — POCT I-STAT TROPONIN I: Troponin i, poc: 0.01 ng/mL (ref 0.00–0.08)

## 2011-05-17 LAB — LIPASE, BLOOD: Lipase: 19 U/L (ref 11–59)

## 2011-05-17 MED ORDER — SODIUM CHLORIDE 0.9 % IV BOLUS (SEPSIS)
1000.0000 mL | Freq: Once | INTRAVENOUS | Status: AC
  Administered 2011-05-17: 1000 mL via INTRAVENOUS

## 2011-05-17 MED ORDER — INSULIN ASPART 100 UNIT/ML ~~LOC~~ SOLN
5.0000 [IU] | Freq: Once | SUBCUTANEOUS | Status: AC
  Administered 2011-05-17: 5 [IU] via INTRAVENOUS
  Filled 2011-05-17: qty 1

## 2011-05-17 MED ORDER — ASPIRIN 81 MG PO CHEW
162.0000 mg | CHEWABLE_TABLET | Freq: Once | ORAL | Status: AC
  Administered 2011-05-17: 162 mg via ORAL
  Filled 2011-05-17: qty 2

## 2011-05-17 MED ORDER — SODIUM CHLORIDE 0.9 % IV SOLN
Freq: Once | INTRAVENOUS | Status: AC
  Administered 2011-05-17: 22:00:00 via INTRAVENOUS

## 2011-05-17 NOTE — ED Notes (Signed)
Patient with chest pain that started last night, went away this am and came back this afternoon.  Patient does have some shortness of breath, nausea.

## 2011-05-17 NOTE — ED Notes (Signed)
Pt states she awoke last night with chest pain located underneath left breast. Pt states "It feels like a pen is being stabbed into my chest." Pt states she has associated SOB and nausea. Pt states she does feel some of the pain in her left shoulder. Pain does not inc with exertion, but it does inc with deep inspiration.

## 2011-05-17 NOTE — ED Provider Notes (Signed)
History     CSN: [id number]  Arrival date & time 05/17/11  2046   First MD Initiated Contact with Patient 05/17/11 2132      Chief Complaint  Patient presents with  . Chest Pain    (Consider location/radiation/quality/duration/timing/severity/associated sxs/prior treatment) HPI Comments: Patient is an insulin-dependent diabetic, who has had intermittent left chest pain since yesterday associated with some mild shortness of breath.  When the pain hits.  She also reports 3 days of loose stools worse on Friday decreasing in frequency since then.  Denies nausea or vomiting.  Patient is a 41 y.o. female presenting with chest pain. The history is provided by the patient.  Chest Pain The chest pain began 12 - 24 hours ago. Chest pain occurs intermittently. The chest pain is improving. At its most intense, the pain is at 7/10. The pain is currently at 2/10. The severity of the pain is moderate. The quality of the pain is described as aching. The pain does not radiate. Primary symptoms include shortness of breath. Pertinent negatives for primary symptoms include no fever, no syncope, no cough, no wheezing, no palpitations, no abdominal pain, no nausea, no vomiting and no dizziness.  Pertinent negatives for associated symptoms include no weakness. She tried nothing for the symptoms.     Past Medical History  Diagnosis Date  . Diabetes mellitus   . Fibromyalgia   . Arthritis   . Hyperlipidemia   . Chills   . Abdominal pain   . Nausea & vomiting   . Biliary dyskinesia   . Depression   . Migraine   . Joint pain   . Skin rash     due to medications  . Urination, excessive at night   . Post traumatic stress disorder (PTSD)   . Sleep trouble     Past Surgical History  Procedure Date  . Tonsillectomy 2001  . Lipoma removed   . Knee surgery   . Cesarean section 1997, 1999  . Abdominal hysterectomy 2001  . Tumor removal 2000    lower back  . Eye surgery 2006    laser eye surgery  left eye  . Cholecystectomy 02/10/2011    Procedure: LAPAROSCOPIC CHOLECYSTECTOMY WITH INTRAOPERATIVE CHOLANGIOGRAM;  Surgeon: Rulon Abide, DO;  Location: [facility] OR;  Service: General;  Laterality: N/A;    Family History  Problem Relation Age of Onset  . Cancer Father     lymphoma  . Cancer Maternal Grandmother     colon    History  Substance Use Topics  . Smoking status: Never Smoker   . Smokeless tobacco: Never Used  . Alcohol Use: No    OB History    Grav Para Term Preterm Abortions TAB SAB Ect Mult Living                  Review of Systems  Constitutional: Negative for fever.  Respiratory: Positive for shortness of breath. Negative for cough and wheezing.   Cardiovascular: Positive for chest pain. Negative for palpitations and syncope.  Gastrointestinal: Negative for nausea, vomiting and abdominal pain.  Genitourinary: Negative for dysuria.  Neurological: Negative for dizziness, weakness and headaches.    Allergies  Ibuprofen; Tylenol; and Keflex  Home Medications   Current Outpatient Rx  Name Route Sig Dispense Refill  . CITALOPRAM HYDROBROMIDE 40 MG PO TABS Oral Take 40 mg by mouth at bedtime.     . CYCLOBENZAPRINE HCL 10 MG PO TABS Oral Take 10 mg by mouth 3 (three)  times daily as needed. For muscle spasms and pain    . IMIPRAMINE PAMOATE 100 MG PO CAPS Oral Take 100 mg by mouth at bedtime.     . INSULIN ASPART 100 UNIT/ML [place] SOLN Subcutaneous Inject 0-5 Units into the skin 3 (three) times daily before meals.     . INSULIN GLARGINE 100 UNIT/ML  SOLN Subcutaneous Inject 24 Units into the skin at bedtime.       BP 148/97  Pulse 101  Temp(Src) 98.8 F (37.1 C) (Oral)  Resp 17  SpO2 100%  Physical Exam  Constitutional: She is oriented to person, place, and time. She appears well-developed and well-nourished.  HENT:  Head: Normocephalic.  Eyes: Pupils are equal, round, and reactive to light.  Neck: Normal range of motion.  Cardiovascular: Normal  rate and regular rhythm.   Pulmonary/Chest: Effort normal and breath sounds normal.  Abdominal: Soft. Bowel sounds are normal. She exhibits no distension.    Musculoskeletal: Normal range of motion.  Neurological: She is alert and oriented to person, place, and time.  Skin: Skin is warm.  ED ECG REPORT   Date: 05/17/2011  EKG Time: 9:50 PM  Rate: 99  Rhythm: normal sinus rhythm,  normal EKG, normal sinus rhythm, unchanged from previous tracings  Axis: right  Intervals:none  ST&T Change: none  Narrative Interpretation: abnormal but stable            ED Course  Procedures (including critical care time)   Labs Reviewed  CBC  DIFFERENTIAL  COMPREHENSIVE METABOLIC PANEL  LIPASE, BLOOD   No results found.   No diagnosis found.  Of note, patient does not have an allergy to Tylenol or ibuprofen.  She was told before she had her gallbladder removed.  Due to moderately elevated LFTs to avoid these products  MDM  Will evaluate for coronary cause for her chest pain, but this is tender in her left upper quadrant.  She has been evaluated for pancreatitis in the past.  All this was negative and she had her gallbladder and it is to have the episodes have become less frequent in nature, but this is the same type of pain she was having before her surgery.  12:37 a.m. labs reviewed.  EKG is unchanged.  Chest x-ray is normal cardiac troponin is negative.  She was given a liter of fluid, and 5 units of IV insulin.  Her blood sugar is about 188.  An early discharge.  This patient home to followup with her primary care provider       Arman Filter, NP 05/18/11 [phone]

## 2011-05-18 LAB — GLUCOSE, CAPILLARY: Glucose-Capillary: 186 mg/dL — ABNORMAL HIGH (ref 70–99)

## 2011-05-18 MED ORDER — HYDROCODONE-ACETAMINOPHEN 5-325 MG PO TABS
1.0000 | ORAL_TABLET | Freq: Once | ORAL | Status: AC
  Administered 2011-05-18: 1 via ORAL
  Filled 2011-05-18: qty 1

## 2011-05-18 MED ORDER — HYDROCODONE-ACETAMINOPHEN 5-325 MG PO TABS: 1.0000 | ORAL_TABLET | Freq: Once | ORAL | Status: AC

## 2011-05-18 NOTE — ED Notes (Signed)
PharmD at [facility] on [address] called to see what the instructions where for the [facility] Rx.  Maureen Spry NP sts Take 1 tab q 6 hrs PRN pain.  This information was given to the pharmacist at [phone].

## 2011-05-18 NOTE — Discharge Instructions (Signed)
Chest Pain (Nonspecific) Chest pain has many causes. Your pain could be caused by something serious, such as a heart attack or a blood clot in the lungs. It could also be caused by something less serious, such as a chest bruise or a virus. Follow up with your doctor. More lab tests or other studies may be needed to find the cause of your pain. Most of the time, nonspecific chest pain will improve within 2 to 3 days of rest and mild pain medicine. HOME CARE  For chest bruises, you may put ice on the sore area for 15 to 20 minutes, 3 to 4 times a day. Do this only if it makes you or your child feel better.   Put ice in a plastic bag.   Place a towel between the skin and the bag.   Rest for the next 2 to 3 days.   Go back to work if the pain improves.   See your doctor if the pain lasts longer than 1 to 2 weeks.   Only take medicine as told by your doctor.   Quit smoking if you smoke.  GET HELP RIGHT AWAY IF:   There is more pain or pain that spreads to the arm, neck, jaw, back, or belly (abdomen).   You or your child has shortness of breath.   You or your child coughs more than usual or coughs up blood.   You or your child has very bad back or belly pain, feels sick to his or her stomach (nauseous), or throws up (vomits).   You or your child has very bad weakness.   You or your child passes out (faints).   You or your child has a temperature by mouth above 102 F (38.9 C), not controlled by medicine.  Any of these problems may be serious and may be an emergency. Do not wait to see if the problems will go away. Get medical help right away. Call your local emergency services 911 in [place] Do not drive yourself to the hospital. MAKE SURE YOU:   Understand these instructions.   Will watch this condition.   Will get help right away if you or your child is not doing well or gets worse.  Document Released: 06/24/2007 Document Revised: 12/25/2010 Document Reviewed:  06/24/2007 [employer] Patient Information 2012 [employer]. 

## 2011-05-18 NOTE — ED Notes (Signed)
RX x1 given to pt

## 2011-05-19 NOTE — ED Provider Notes (Signed)
Medical screening examination/treatment/procedure(s) were performed by non-physician practitioner and as supervising physician I was immediately available for consultation/collaboration.   Celene Kras, MD 05/19/11 [phone]

## 2011-05-21 ENCOUNTER — Telehealth: Payer: Self-pay

## 2011-05-21 NOTE — Telephone Encounter (Signed)
Pt is needing a refill on flexeril   Please call [phone]

## 2011-05-22 MED ORDER — CYCLOBENZAPRINE HCL 10 MG PO TABS: 10.0000 mg | ORAL_TABLET | Freq: Three times a day (TID) | ORAL | Status: DC | PRN

## 2011-05-22 NOTE — Telephone Encounter (Signed)
rx authorized

## 2011-05-24 NOTE — Telephone Encounter (Signed)
LMOM THAT RX WAS SENT INTO PHARMACY 

## 2011-06-05 ENCOUNTER — Encounter (HOSPITAL_BASED_OUTPATIENT_CLINIC_OR_DEPARTMENT_OTHER): Payer: Self-pay

## 2011-06-05 ENCOUNTER — Telehealth: Payer: Self-pay

## 2011-06-05 ENCOUNTER — Emergency Department (HOSPITAL_BASED_OUTPATIENT_CLINIC_OR_DEPARTMENT_OTHER)
Admission: EM | Admit: 2011-06-05 | Discharge: 2011-06-05 | Disposition: A | Discharge status: Home / Self Care | Payer: Managed Care, Other (non HMO) | Attending: Emergency Medicine | Admitting: Emergency Medicine

## 2011-06-05 DIAGNOSIS — M129 Arthropathy, unspecified: Secondary | ICD-10-CM | POA: Insufficient documentation

## 2011-06-05 DIAGNOSIS — E162 Hypoglycemia, unspecified: Secondary | ICD-10-CM

## 2011-06-05 DIAGNOSIS — F4310 Post-traumatic stress disorder, unspecified: Secondary | ICD-10-CM | POA: Insufficient documentation

## 2011-06-05 DIAGNOSIS — E119 Type 2 diabetes mellitus without complications: Secondary | ICD-10-CM | POA: Insufficient documentation

## 2011-06-05 DIAGNOSIS — G43909 Migraine, unspecified, not intractable, without status migrainosus: Secondary | ICD-10-CM

## 2011-06-05 DIAGNOSIS — M542 Cervicalgia: Secondary | ICD-10-CM | POA: Insufficient documentation

## 2011-06-05 DIAGNOSIS — E785 Hyperlipidemia, unspecified: Secondary | ICD-10-CM | POA: Insufficient documentation

## 2011-06-05 DIAGNOSIS — IMO0001 Reserved for inherently not codable concepts without codable children: Secondary | ICD-10-CM | POA: Insufficient documentation

## 2011-06-05 DIAGNOSIS — Z794 Long term (current) use of insulin: Secondary | ICD-10-CM | POA: Insufficient documentation

## 2011-06-05 DIAGNOSIS — Z79899 Other long term (current) drug therapy: Secondary | ICD-10-CM | POA: Insufficient documentation

## 2011-06-05 LAB — GLUCOSE, CAPILLARY
Glucose-Capillary: 42 mg/dL — CL (ref 70–99)
Glucose-Capillary: 126 mg/dL — ABNORMAL HIGH (ref 70–99)

## 2011-06-05 MED ORDER — DIPHENHYDRAMINE HCL 50 MG/ML IJ SOLN
25.0000 mg | Freq: Once | INTRAMUSCULAR | Status: AC
  Administered 2011-06-05: 25 mg via INTRAVENOUS
  Filled 2011-06-05: qty 1

## 2011-06-05 MED ORDER — DEXTROSE 50 % IV SOLN
12.5000 g | Freq: Once | INTRAVENOUS | Status: AC
  Administered 2011-06-05: 12.5 g via INTRAVENOUS
  Filled 2011-06-05: qty 50

## 2011-06-05 MED ORDER — KETOROLAC TROMETHAMINE 30 MG/ML IJ SOLN
30.0000 mg | Freq: Once | INTRAMUSCULAR | Status: AC
  Administered 2011-06-05: 30 mg via INTRAVENOUS
  Filled 2011-06-05: qty 1

## 2011-06-05 MED ORDER — PROMETHAZINE HCL 25 MG PO TABS: 25.0000 mg | ORAL_TABLET | Freq: Four times a day (QID) | ORAL | Status: DC | PRN

## 2011-06-05 MED ORDER — METOCLOPRAMIDE HCL 5 MG/ML IJ SOLN
10.0000 mg | Freq: Once | INTRAMUSCULAR | Status: AC
  Administered 2011-06-05: 10 mg via INTRAVENOUS
  Filled 2011-06-05: qty 2

## 2011-06-05 MED ORDER — SODIUM CHLORIDE 0.9 % IV BOLUS (SEPSIS)
1000.0000 mL | Freq: Once | INTRAVENOUS | Status: AC
  Administered 2011-06-05: 1000 mL via INTRAVENOUS

## 2011-06-05 NOTE — Discharge Instructions (Signed)

## 2011-06-05 NOTE — Telephone Encounter (Signed)
Patient saw [place] and referred her to a rheumatologist but hasn't heard from [place] for the referral.  She also says the Flexeril is not helping so while she waits for the referral is there anything else she can take

## 2011-06-05 NOTE — ED Provider Notes (Signed)
History     CSN: [id number]  Arrival date & time 06/05/11  1340   First MD Initiated Contact with Patient 06/05/11 1352      Chief Complaint  Patient presents with  . Migraine  . Neck Pain    (Consider location/radiation/quality/duration/timing/severity/associated sxs/prior treatment) HPI Comments: Consistent with prior migraine HA.  Gradually worsening  Patient is a 41 y.o. female presenting with migraine.  Migraine This is a new problem. The current episode started 2 days ago. The problem occurs constantly. The problem has been gradually worsening. Pertinent negatives include no chest pain, no abdominal pain, no headaches and no shortness of breath. Exacerbated by: light and sound. The symptoms are relieved by nothing. She has tried nothing for the symptoms. The treatment provided no relief.    Past Medical History  Diagnosis Date  . Diabetes mellitus   . Fibromyalgia   . Arthritis   . Hyperlipidemia   . Chills   . Abdominal pain   . Nausea & vomiting   . Biliary dyskinesia   . Depression   . Migraine   . Joint pain   . Skin rash     due to medications  . Urination, excessive at night   . Post traumatic stress disorder (PTSD)   . Sleep trouble     Past Surgical History  Procedure Date  . Tonsillectomy 2001  . Lipoma removed   . Knee surgery   . Cesarean section 1997, 1999  . Abdominal hysterectomy 2001  . Tumor removal 2000    lower back  . Eye surgery 2006    laser eye surgery left eye  . Cholecystectomy 02/10/2011    Procedure: LAPAROSCOPIC CHOLECYSTECTOMY WITH INTRAOPERATIVE CHOLANGIOGRAM;  Surgeon: Rulon Abide, DO;  Location: [facility] OR;  Service: General;  Laterality: N/A;    Family History  Problem Relation Age of Onset  . Cancer Father     lymphoma  . Cancer Maternal Grandmother     colon    History  Substance Use Topics  . Smoking status: Never Smoker   . Smokeless tobacco: Never Used  . Alcohol Use: No    OB History    Grav Para  Term Preterm Abortions TAB SAB Ect Mult Living                  Review of Systems  Constitutional: Negative for fever, chills, activity change, appetite change and fatigue.  HENT: Positive for neck pain. Negative for congestion, sore throat, rhinorrhea and neck stiffness.   Eyes: Positive for photophobia. Negative for pain, redness and visual disturbance.  Respiratory: Negative for cough and shortness of breath.   Cardiovascular: Negative for chest pain and palpitations.  Gastrointestinal: Negative for nausea, vomiting and abdominal pain.  Genitourinary: Negative for dysuria, urgency, frequency and flank pain.  Musculoskeletal: Negative for myalgias, back pain and arthralgias.  Neurological: Negative for dizziness, weakness, light-headedness, numbness and headaches.  All other systems reviewed and are negative.    Allergies  Ibuprofen; Tylenol; and Cephalexin  Home Medications   Current Outpatient Rx  Name Route Sig Dispense Refill  . CITALOPRAM HYDROBROMIDE 40 MG PO TABS Oral Take 40 mg by mouth at bedtime.     . CYCLOBENZAPRINE HCL 10 MG PO TABS Oral Take 1 tablet (10 mg total) by mouth 3 (three) times daily as needed. For muscle spasms and pain 30 tablet 0  . IMIPRAMINE PAMOATE 100 MG PO CAPS Oral Take 100 mg by mouth at bedtime.     Marland Kitchen  INSULIN ASPART 100 UNIT/ML [place] SOLN Subcutaneous Inject 0-5 Units into the skin 3 (three) times daily before meals.     . INSULIN GLARGINE 100 UNIT/ML [place] SOLN Subcutaneous Inject 24 Units into the skin at bedtime.       BP 156/69  Pulse 92  Temp(Src) 98.5 F (36.9 C) (Oral)  Resp 16  Ht 5\' 4"  (1.626 m)  Wt 175 lb (79.379 kg)  BMI 30.04 kg/m2  SpO2 99%  Physical Exam  Nursing note and vitals reviewed. Constitutional: She is oriented to person, place, and time. She appears well-developed and well-nourished. No distress.  HENT:  Head: Normocephalic and atraumatic.  Mouth/Throat: Oropharynx is clear and moist.  Eyes: Conjunctivae and EOM  are normal. Pupils are equal, round, and reactive to light.  Neck: Normal range of motion. Neck supple.  Cardiovascular: Normal rate, regular rhythm, normal heart sounds and intact distal pulses.  Exam reveals no gallop and no friction rub.   No murmur heard. Pulmonary/Chest: Effort normal and breath sounds normal. No respiratory distress. She exhibits no tenderness.  Abdominal: Soft. Bowel sounds are normal. There is no tenderness.  Musculoskeletal: Normal range of motion.  Neurological: She is alert and oriented to person, place, and time. She has normal strength. No cranial nerve deficit or sensory deficit.  Skin: Skin is warm and dry. No rash noted.    ED Course  Procedures (including critical care time)  Labs Reviewed  GLUCOSE, CAPILLARY - Abnormal; Notable for the following:    Glucose-Capillary 42 (*)    All other components within normal limits   No results found.   1. Migraine headache       MDM  Consistent with a migraine-type headache. Consistent with her prior migraines. There is no indication for imaging at this time. I have no concern about a malignant cause of headache such as subarachnoid hemorrhage or meningitis. She was found to be hypoglycemic. She will receive a dose of dextrose as well as food. Will be discharged home with instructions to followup with her primary care physician. Recheck improved CBG        Dayton Bailiff, MD 06/05/11 1525

## 2011-06-05 NOTE — ED Notes (Signed)
Pt c/o migraine onset today, neck pain for past 3 days.  Pt has HX of migraine but this is 1st episode in months.  Pt states she used to take Phenergan but it made her sleepy, pt has had no meds PTA.

## 2011-06-06 MED ORDER — TRAMADOL HCL 50 MG PO TABS: 50.0000 mg | ORAL_TABLET | Freq: Three times a day (TID) | ORAL | Status: DC | PRN

## 2011-06-06 NOTE — Telephone Encounter (Signed)
PLEASE ADVISE ON PT'S REFERRAL. LOOKS LIKE THIS HAS BEEN GOING ON FOR TOO LONG. ALSO, IS THERE SOMETHING ELSE WE CAN PRESCRIBE BESIDES THE FLEXERIL?

## 2011-06-06 NOTE — Telephone Encounter (Signed)
Referral has been made. Referrals please check on this. Will send in some ultram.

## 2011-06-10 ENCOUNTER — Ambulatory Visit (INDEPENDENT_AMBULATORY_CARE_PROVIDER_SITE_OTHER): Payer: Managed Care, Other (non HMO) | Admitting: Family Medicine

## 2011-06-10 VITALS — BP 160/92 | HR 67 | Temp 99.0°F | Resp 16 | Ht 64.0 in | Wt 168.0 lb

## 2011-06-10 DIAGNOSIS — B370 Candidal stomatitis: Secondary | ICD-10-CM

## 2011-06-10 DIAGNOSIS — J029 Acute pharyngitis, unspecified: Secondary | ICD-10-CM

## 2011-06-10 DIAGNOSIS — G8929 Other chronic pain: Secondary | ICD-10-CM

## 2011-06-10 LAB — POCT RAPID STREP A (OFFICE): Rapid Strep A Screen: NEGATIVE

## 2011-06-10 LAB — POCT SKIN KOH: Skin KOH, POC: POSITIVE

## 2011-06-10 MED ORDER — OXYCODONE HCL 5 MG PO TABS: 5.0000 mg | ORAL_TABLET | Freq: Two times a day (BID) | ORAL | Status: DC | PRN

## 2011-06-10 MED ORDER — CLOTRIMAZOLE 10 MG MT TROC: 10.0000 mg | Freq: Every day | OROMUCOSAL | Status: DC

## 2011-06-10 NOTE — Progress Notes (Signed)
Patient Name: Maureen [name] Date of Birth: 1970-12-13 Medical Record Number: [id number] Gender: female Date of Encounter: 06/10/2011  History of Present Illness:  Maureen [name] is a 41 y.o. very pleasant female patient who presents with the following:  She has had a ST for the last couple of days.  She has not noted a fever but she has not checked.  She has noted a runny nose but no cough.  No GI symptoms.  She feels that her ST is more in the roof of her mouth than in the back of her throat.  She sees an endocrinologist for her DM- she was last there about 3 months ago.  Her last A1c was 7.3.  She feels that her glucose has been worse over the last couple of weeks- this morning it was 223 fasting.    She also is supposed to be seeing a rheumatologist for her FBM but has not heard about this yet.  She is still having a lot of FBM pain especially in her neck. She would also like to have a few more oxycodone if possible  She has been HIV tested in the past when she had shingles and has no new sexual partners or other risk factors when asked.   There is no problem list on file for this patient.  Past Medical History  Diagnosis Date  . Diabetes mellitus   . Fibromyalgia   . Arthritis   . Hyperlipidemia   . Chills   . Abdominal pain   . Nausea & vomiting   . Biliary dyskinesia   . Depression   . Migraine   . Joint pain   . Skin rash     due to medications  . Urination, excessive at night   . Post traumatic stress disorder (PTSD)   . Sleep trouble    Past Surgical History  Procedure Date  . Tonsillectomy 2001  . Lipoma removed   . Knee surgery   . Cesarean section 1997, 1999  . Abdominal hysterectomy 2001  . Tumor removal 2000    lower back  . Eye surgery 2006    laser eye surgery left eye  . Cholecystectomy 02/10/2011    Procedure: LAPAROSCOPIC CHOLECYSTECTOMY WITH INTRAOPERATIVE CHOLANGIOGRAM;  Surgeon: Rulon Abide, DO;  Location: [facility] OR;  Service: General;   Laterality: N/A;   History  Substance Use Topics  . Smoking status: Never Smoker   . Smokeless tobacco: Never Used  . Alcohol Use: No   Family History  Problem Relation Age of Onset  . Cancer Father     lymphoma  . Cancer Maternal Grandmother     colon   Allergies  Allergen Reactions  . Ibuprofen Nausea And Vomiting  . Tylenol (Acetaminophen) Other (See Comments)    Liver enzymes elevated.  . Cephalexin Rash    Medication list has been reviewed and updated.  Review of Systems: As per HPI- otherwise negative.Marland Kitchen   Physical Examination: Filed Vitals:   06/10/11 1927  BP: 160/92  Pulse: 67  Temp: 99 F (37.2 C)  TempSrc: Oral  Resp: 16  Height: 5\' 4"  (1.626 m)  Weight: 168 lb (76.204 kg)  SpO2: 98%    Body mass index is 28.84 kg/(m^2).  GEN: WDWN, NAD, Non-toxic, A & O x 3, overweight HEENT: Atraumatic, Normocephalic. Neck supple. No masses, No LAD.  TM wnl bilaterally, oropharynx appears consistent with oral thrush.  No exudate or tonsillar enlargement Ears and Nose: No external deformity. CV: RRR,  No M/G/R. No JVD. No thrill. No extra heart sounds. PULM: CTA B, no wheezes, crackles, rhonchi. No retractions. No resp. distress. No accessory muscle use. ABD: S, NT, ND, +BS. No rebound. No HSM. EXTR: No c/c/e NEURO Normal gait.  PSYCH: Normally interactive. Conversant. Not depressed or anxious appearing.  Calm demeanor.  Stiffness and soreness in her trapezius muscles which she attributes to her FBM- this is chroinic  Results for orders placed in visit on 06/10/11  POCT RAPID STREP A (OFFICE)      Component Value Range   Rapid Strep A Screen Negative  Negative   POCT SKIN KOH      Component Value Range   Skin KOH, POC Positive    did KOH of throat today Assessment and Plan: 1. Sore throat  POCT rapid strep A, POCT Skin KOH  2. Oral thrush  clotrimazole (MYCELEX) 10 MG troche  3. Chronic pain  oxyCODONE (ROXICODONE) 5 MG immediate release tablet   Treat  oral thrush as above- presume due to her poorly controlled DM. Patient (or parent if minor) instructed to return to clinic or call if not better in 3-4 day(s).  Did give a small supply of oxycodone- will work on seeing what happened with her rheumatology referral- she has not yet heard anything from a referral a month ago.

## 2011-06-15 ENCOUNTER — Telehealth: Payer: Self-pay

## 2011-06-15 DIAGNOSIS — B370 Candidal stomatitis: Secondary | ICD-10-CM

## 2011-06-15 MED ORDER — FLUCONAZOLE 100 MG PO TABS: 100.0000 mg | ORAL_TABLET | Freq: Every day | ORAL | Status: DC

## 2011-06-15 NOTE — Telephone Encounter (Signed)
Called and tried to reach her- I will call her tomorrow and we can try to work something out.  Also, take 1/2 of her celexa while she is on the diflucan as it can increase SSRI levels.

## 2011-06-15 NOTE — Telephone Encounter (Signed)
Pt called back to ask about message she left yesterday. She had left message on 5/26 and it was put into a duplicate chart for the pt under #[id number] (sent message asking for chart to be combined). Pt had stated in yesterdays message that she still had a fever and that she needed a RF on med. When I spoke w/pt today she clarified that she does not have a fever today, but did yesterday. She had to stop taking the clotrimazole today  bc it is causing her a lot of nausea and abd pain. Also, she wondered if Dr Patsy Lager can get her a RF on the Oxycodone since she has about #6 left out of the #20 Rx'd and she doesn't think she will get in to see rheumatologist this week? Dr Patsy Lager, please advise.

## 2011-06-15 NOTE — Telephone Encounter (Signed)
Advised pt that Rx for Diflucan has been sent in and pt stated that there is no chance that she is pregnant. D/W pt referral w/rheumatologist and that [facility] told our Referral dept that they were going to schedule pt to be seen in June, and that they received the orig referral last month but records didn't go through clearly. Told pt per Dr Patsy Lager that we can not Rx any more Oxycodone, and that she should RTC if the #6 she has left will not last her until appt. Pt asked if she would have to pay another co-pay and stated that she just can't afford another co-pay, bc when she didn't get the referral last month she had to go to the ED which cost $150 and then she was here on 5/22. Is there anything else we can Rx for her pain that might help w/out another OV?

## 2011-06-16 ENCOUNTER — Emergency Department (HOSPITAL_COMMUNITY)
Admission: EM | Admit: 2011-06-16 | Discharge: 2011-06-16 | Disposition: A | Discharge status: Home / Self Care | Payer: Managed Care, Other (non HMO) | Attending: Emergency Medicine | Admitting: Emergency Medicine

## 2011-06-16 DIAGNOSIS — T50905A Adverse effect of unspecified drugs, medicaments and biological substances, initial encounter: Secondary | ICD-10-CM

## 2011-06-16 DIAGNOSIS — I10 Essential (primary) hypertension: Secondary | ICD-10-CM | POA: Insufficient documentation

## 2011-06-16 DIAGNOSIS — E119 Type 2 diabetes mellitus without complications: Secondary | ICD-10-CM | POA: Insufficient documentation

## 2011-06-16 DIAGNOSIS — T887XXA Unspecified adverse effect of drug or medicament, initial encounter: Secondary | ICD-10-CM | POA: Insufficient documentation

## 2011-06-16 DIAGNOSIS — Z794 Long term (current) use of insulin: Secondary | ICD-10-CM | POA: Insufficient documentation

## 2011-06-16 DIAGNOSIS — N390 Urinary tract infection, site not specified: Secondary | ICD-10-CM

## 2011-06-16 DIAGNOSIS — R197 Diarrhea, unspecified: Secondary | ICD-10-CM | POA: Insufficient documentation

## 2011-06-16 DIAGNOSIS — R109 Unspecified abdominal pain: Secondary | ICD-10-CM | POA: Insufficient documentation

## 2011-06-16 DIAGNOSIS — Z9089 Acquired absence of other organs: Secondary | ICD-10-CM | POA: Insufficient documentation

## 2011-06-16 DIAGNOSIS — R10819 Abdominal tenderness, unspecified site: Secondary | ICD-10-CM | POA: Insufficient documentation

## 2011-06-16 DIAGNOSIS — R110 Nausea: Secondary | ICD-10-CM | POA: Insufficient documentation

## 2011-06-16 LAB — URINALYSIS, ROUTINE W REFLEX MICROSCOPIC
Bilirubin Urine: NEGATIVE
Glucose, UA: >1000 mg/dL — AB
Ketones, ur: NEGATIVE mg/dL
Nitrite: NEGATIVE
Protein, ur: 30 mg/dL — AB
Specific Gravity, Urine: 1.03 (ref 1.005–1.030)
Urobilinogen, UA: 1 mg/dL (ref 0.0–1.0)
pH: 6.5 (ref 5.0–8.0)

## 2011-06-16 LAB — PREGNANCY, URINE: Preg Test, Ur: NEGATIVE

## 2011-06-16 LAB — URINE MICROSCOPIC-ADD ON

## 2011-06-16 MED ORDER — TRAMADOL HCL 50 MG PO TABS: 50.0000 mg | ORAL_TABLET | Freq: Four times a day (QID) | ORAL | Status: DC | PRN

## 2011-06-16 MED ORDER — OXYCODONE HCL 5 MG PO TABS: 5.0000 mg | ORAL_TABLET | ORAL | Status: AC | PRN

## 2011-06-16 MED ORDER — PROMETHAZINE HCL 25 MG PO TABS: 25.0000 mg | ORAL_TABLET | Freq: Four times a day (QID) | ORAL | Status: DC | PRN

## 2011-06-16 MED ORDER — NITROFURANTOIN MONOHYD MACRO 100 MG PO CAPS: 100.0000 mg | ORAL_CAPSULE | Freq: Two times a day (BID) | ORAL | Status: AC

## 2011-06-16 NOTE — Discharge Instructions (Signed)
You appear to have a urinary tract infection. We will give you a prescription for antibiotics to treat this. You have also been given a medicaiton for nausea to take. Take ALL of the antibiotic until gone. STOP the medication for thrush. Return to the ED with worsening or changing abdominal pain, increased nausea/vomiting, or any other worrisome symptoms.  RESOURCE GUIDE  Dental Problems  Patients with Medicaid: [facility] [phone]                                           [phone] [address] Phone:  [phone]                                                  Phone:  [phone]  If unable to pay or uninsured, contact:  [facility] or [facility]. to become qualified for the adult dental clinic.  Chronic Pain Problems Contact [facility]  [phone] Patients need to be referred by their primary care doctor.  Insufficient Money for Medicine Contact United Way:  call "211" or [facility] 312 043 3451.  No Primary Care Doctor Call [facility]  [phone] Other agencies that provide inexpensive medical care    [facility]  [phone]    [facility]  [phone]    [facility]  [phone]    [facility]  [phone]    [facility]  [phone]    [facility]  [phone]  [facility]  [phone] [facility]  5201220591 [facility]   [phone] (emergency services [phone])  Substance Abuse Resources [facility]  801-676-5668 [facility] 334-510-4392 [facility] [phone] Floydene Flock [phone] Residential & Outpatient Substance Abuse Program  [phone]  Abuse/Neglect [facility] Child Abuse Hotline 864-692-8120 [facility] Child Abuse Hotline 325-814-8442 (After Hours)  Emergency Shelter [facility]  [phone]  [facility] [phone] [facility] [phone]) [phone]  MRSA Hotline #:   906-142-2302    [facility] Resources  [facility]     United Way                          [facility] Dept. [address] [place]                       [address]      [address]  [place]                                                Cristobal Goldmann Phone:  [phone]  Phone:  [phone]                 Phone:  [phone]  [facility] Mental [facility] Phone:  [phone]  [facility] Child Abuse Hotline 650-878-4822 279-614-3007 (After Hours)  Urinary Tract Infection Infections of the urinary tract can start in several places. A bladder infection (cystitis), a kidney infection (pyelonephritis), and a prostate infection (prostatitis) are different types of urinary tract infections (UTIs). They usually get better if treated with medicines (antibiotics) that kill germs. Take all the medicine until it is gone. You or your child may feel better in a few days, but TAKE ALL MEDICINE or the infection may not respond and may become more difficult to treat. HOME CARE INSTRUCTIONS   Drink enough water and fluids to keep the urine clear or pale yellow. Cranberry juice is especially recommended, in addition to large amounts of water.   Avoid caffeine, tea, and carbonated beverages. They tend to irritate the bladder.   Alcohol may irritate the prostate.   Only take over-the-counter or prescription medicines for pain, discomfort, or fever as directed by your caregiver.  To prevent further infections:  Empty the bladder often. Avoid holding urine for long periods of time.   After a bowel movement, women should cleanse from front to back. Use each tissue only once.   Empty the bladder before and after sexual intercourse.  FINDING  OUT THE RESULTS OF YOUR TEST Not all test results are available during your visit. If your or your child's test results are not back during the visit, make an appointment with your caregiver to find out the results. Do not assume everything is normal if you have not heard from your caregiver or the medical facility. It is important for you to follow up on all test results. SEEK MEDICAL CARE IF:   There is back pain.   Your baby is older than 3 months with a rectal temperature of 100.5 F (38.1 C) or higher for more than 1 day.   Your or your child's problems (symptoms) are no better in 3 days. Return sooner if you or your child is getting worse.  SEEK IMMEDIATE MEDICAL CARE IF:   There is severe back pain or lower abdominal pain.   You or your child develops chills.   You have a fever.   Your baby is older than 3 months with a rectal temperature of 102 F (38.9 C) or higher.   Your baby is 27 months old or younger with a rectal temperature of 100.4 F (38 C) or higher.   There is nausea or vomiting.   There is continued burning or discomfort with urination.  MAKE SURE YOU:   Understand these instructions.   Will watch your condition.   Will get help right away if you are not doing well or get worse.  Document Released: 10/15/2004 Document Revised: 12/25/2010 Document Reviewed: 05/20/2006 [facility] Patient Information 2012 [place], [place].

## 2011-06-16 NOTE — ED Provider Notes (Signed)
Medical screening examination/treatment/procedure(s) were performed by non-physician practitioner and as supervising physician I was immediately available for consultation/collaboration.    Klinton Candelas R Pavel Gadd, MD 06/16/11 2207 

## 2011-06-16 NOTE — ED Notes (Signed)
Pt was given antibiotics for thrush and after taking the meds she became nausea, no vomiting and stopped taking the medication yesterday unsure of the medication. No c/o of urinary sx,

## 2011-06-16 NOTE — ED Provider Notes (Signed)
History     CSN: [id number]  Arrival date & time 06/16/11  1351   First MD Initiated Contact with Patient 06/16/11 1505      Chief Complaint  Patient presents with  . Abdominal Pain    (Consider location/radiation/quality/duration/timing/severity/associated sxs/prior treatment) HPI History from patient. 41 year old female who presents with complaint of abdominal pain. States she was seen in urgent care about a week ago and given a prescription for oral antibiotics, Lotrimin, to treat oropharyngeal thrush. States that she began to have nausea and diarrhea after this. Has not had any episodes of emesis. States that this is accompanied by a sensation of discomfort to her lower abdomen and at the umbilicus, described as a grabbing sensation. Pain is not significantly changed since onset and does not radiate. She has had some accompanying increase in urination without any pyuria. Denies a strong odor to urine. She is status post hysterectomy and has not had any vaginal bleeding or discharge. Surgical history pertinent for cholecystectomy.  She stopped taking the medication yesterday but has not had any relief of her symptoms.  Past Medical History  Diagnosis Date  . Diabetes mellitus   . Fibromyalgia   . Arthritis   . Hyperlipidemia   . Chills   . Abdominal pain   . Nausea & vomiting   . Biliary dyskinesia   . Depression   . Migraine   . Joint pain   . Skin rash     due to medications  . Urination, excessive at night   . Post traumatic stress disorder (PTSD)   . Sleep trouble     Past Surgical History  Procedure Date  . Tonsillectomy 2001  . Lipoma removed   . Knee surgery   . Cesarean section 1997, 1999  . Abdominal hysterectomy 2001  . Tumor removal 2000    lower back  . Eye surgery 2006    laser eye surgery left eye  . Cholecystectomy 02/10/2011    Procedure: LAPAROSCOPIC CHOLECYSTECTOMY WITH INTRAOPERATIVE CHOLANGIOGRAM;  Surgeon: Rulon Abide, DO;  Location:  [facility] OR;  Service: General;  Laterality: N/A;    Family History  Problem Relation Age of Onset  . Cancer Father     lymphoma  . Cancer Maternal Grandmother     colon    History  Substance Use Topics  . Smoking status: Never Smoker   . Smokeless tobacco: Never Used  . Alcohol Use: No    OB History    Grav Para Term Preterm Abortions TAB SAB Ect Mult Living                  Review of Systems  Constitutional: Positive for appetite change. Negative for fever, chills and activity change.  Respiratory: Negative for cough and shortness of breath.   Cardiovascular: Negative for chest pain and palpitations.  Gastrointestinal: Positive for nausea, abdominal pain and diarrhea. Negative for vomiting, blood in stool, anal bleeding and rectal pain.  Genitourinary: Positive for frequency. Negative for urgency, decreased urine volume, vaginal bleeding and vaginal discharge.  Musculoskeletal: Negative for myalgias and joint swelling.  Skin: Negative for color change.  Neurological: Negative for dizziness and weakness.    Allergies  Ibuprofen; Tylenol; and Cephalexin  Home Medications   Current Outpatient Rx  Name Route Sig Dispense Refill  . CITALOPRAM HYDROBROMIDE 40 MG PO TABS Oral Take 40 mg by mouth at bedtime.     . INSULIN ASPART 100 UNIT/ML [place] SOLN Subcutaneous Inject 0-5 Units into the  skin 3 (three) times daily before meals.     . INSULIN GLARGINE 100 UNIT/ML [place] SOLN Subcutaneous Inject 24 Units into the skin at bedtime.     . OXYCODONE HCL 5 MG PO TABS Oral Take 5 mg by mouth 2 (two) times daily as needed.    Marland Kitchen PROMETHAZINE HCL 25 MG PO TABS Oral Take 1 tablet (25 mg total) by mouth every 6 (six) hours as needed for nausea. 20 tablet 0    BP 143/90  Pulse 101  Temp(Src) 98.9 F (37.2 C) (Oral)  Resp 14  SpO2 100%  Physical Exam  Nursing note and vitals reviewed. Constitutional: She appears well-developed and well-nourished. No distress.  HENT:  Head: Normocephalic  and atraumatic.  Mouth/Throat: Oropharynx is clear and moist. No oropharyngeal exudate.       No evidence of thrush in the oropharynx.  Neck: Normal range of motion.  Cardiovascular: Normal rate, regular rhythm and normal heart sounds.   Pulmonary/Chest: Effort normal and breath sounds normal. She exhibits no tenderness.  Abdominal: Soft. Bowel sounds are normal. She exhibits no distension. There is tenderness. There is no rebound and no guarding.       Very slight tenderness to suprapubic region  Musculoskeletal: Normal range of motion.  Neurological: She is alert.  Skin: Skin is warm and dry. She is not diaphoretic.  Psychiatric: She has a normal mood and affect.    ED Course  Procedures (including critical care time)  Labs Reviewed  URINALYSIS, ROUTINE W REFLEX MICROSCOPIC - Abnormal; Notable for the following:    APPearance CLOUDY (*)    Glucose, UA >1000 (*)    Hgb urine dipstick SMALL (*)    Protein, ur 30 (*)    Leukocytes, UA MODERATE (*)    All other components within normal limits  URINE MICROSCOPIC-ADD ON - Abnormal; Notable for the following:    Bacteria, UA FEW (*)    All other components within normal limits  PREGNANCY, URINE   No results found.   1. Medication reaction   2. Urinary tract infection       MDM  Patient presents with nausea and diarrhea and slight abdominal discomfort after being started on an antifungal for possible thrush last week. There is slight tenderness to exam in the suprapubic area. Urine appears consistent with UTI. Will treat. Reasons to return to the ED discussed.     Vali Capano, [place] 06/16/11 1732

## 2011-06-16 NOTE — Telephone Encounter (Signed)
Called no answer.  Left message on machine.

## 2011-06-18 LAB — URINE CULTURE
Colony Count: >=100000
Culture  Setup Time: 201305290346

## 2011-06-19 NOTE — ED Notes (Signed)
+  Urine. Patient treated with Macrobid. Sensitive to same. Per protocol MD. °

## 2011-06-23 ENCOUNTER — Emergency Department (HOSPITAL_COMMUNITY)
Admission: EM | Admit: 2011-06-23 | Discharge: 2011-06-24 | Disposition: A | Discharge status: Home / Self Care | Payer: Managed Care, Other (non HMO) | Attending: Emergency Medicine | Admitting: Emergency Medicine

## 2011-06-23 ENCOUNTER — Encounter (HOSPITAL_COMMUNITY): Payer: Self-pay | Admitting: *Deleted

## 2011-06-23 ENCOUNTER — Emergency Department (HOSPITAL_COMMUNITY): Payer: Managed Care, Other (non HMO)

## 2011-06-23 DIAGNOSIS — E785 Hyperlipidemia, unspecified: Secondary | ICD-10-CM | POA: Insufficient documentation

## 2011-06-23 DIAGNOSIS — E109 Type 1 diabetes mellitus without complications: Secondary | ICD-10-CM | POA: Insufficient documentation

## 2011-06-23 DIAGNOSIS — R0602 Shortness of breath: Secondary | ICD-10-CM | POA: Insufficient documentation

## 2011-06-23 DIAGNOSIS — Z79899 Other long term (current) drug therapy: Secondary | ICD-10-CM | POA: Insufficient documentation

## 2011-06-23 DIAGNOSIS — R071 Chest pain on breathing: Secondary | ICD-10-CM | POA: Insufficient documentation

## 2011-06-23 DIAGNOSIS — M549 Dorsalgia, unspecified: Secondary | ICD-10-CM | POA: Insufficient documentation

## 2011-06-23 DIAGNOSIS — R110 Nausea: Secondary | ICD-10-CM | POA: Insufficient documentation

## 2011-06-23 DIAGNOSIS — Z8739 Personal history of other diseases of the musculoskeletal system and connective tissue: Secondary | ICD-10-CM | POA: Insufficient documentation

## 2011-06-23 DIAGNOSIS — R0789 Other chest pain: Secondary | ICD-10-CM

## 2011-06-23 DIAGNOSIS — IMO0001 Reserved for inherently not codable concepts without codable children: Secondary | ICD-10-CM | POA: Insufficient documentation

## 2011-06-23 LAB — URINALYSIS, ROUTINE W REFLEX MICROSCOPIC
Bilirubin Urine: NEGATIVE
Glucose, UA: >1000 mg/dL — AB
Hgb urine dipstick: NEGATIVE
Ketones, ur: NEGATIVE mg/dL
Leukocytes, UA: NEGATIVE
Nitrite: NEGATIVE
Protein, ur: 30 mg/dL — AB
Specific Gravity, Urine: 1.037 — ABNORMAL HIGH (ref 1.005–1.030)
Urobilinogen, UA: 0.2 mg/dL (ref 0.0–1.0)
pH: 5.5 (ref 5.0–8.0)

## 2011-06-23 LAB — BASIC METABOLIC PANEL
BUN: 8 mg/dL (ref 6–23)
CO2: 26 mEq/L (ref 19–32)
Calcium: 9.5 mg/dL (ref 8.4–10.5)
Chloride: 94 mEq/L — ABNORMAL LOW (ref 96–112)
Creatinine, Ser: 0.74 mg/dL (ref 0.50–1.10)
GFR calc Af Amer: >90 mL/min (ref >90)
GFR calc non Af Amer: >90 mL/min (ref >90)
Glucose, Bld: 204 mg/dL — ABNORMAL HIGH (ref 70–99)
Potassium: 5.9 mEq/L — ABNORMAL HIGH (ref 3.5–5.1)
Sodium: 131 mEq/L — ABNORMAL LOW (ref 135–145)

## 2011-06-23 LAB — URINE MICROSCOPIC-ADD ON

## 2011-06-23 LAB — DIFFERENTIAL
Basophils Absolute: 0.1 10*3/uL (ref 0.0–0.1)
Basophils Relative: 1 % (ref 0–1)
Eosinophils Absolute: 0.2 10*3/uL (ref 0.0–0.7)
Eosinophils Relative: 1 % (ref 0–5)
Lymphocytes Relative: 29 % (ref 12–46)
Lymphs Abs: 3.8 10*3/uL (ref 0.7–4.0)
Monocytes Absolute: 0.9 10*3/uL (ref 0.1–1.0)
Monocytes Relative: 6 % (ref 3–12)
Neutro Abs: 8.3 10*3/uL — ABNORMAL HIGH (ref 1.7–7.7)
Neutrophils Relative %: 63 % (ref 43–77)

## 2011-06-23 LAB — CBC
HCT: 45.7 % (ref 36.0–46.0)
Hemoglobin: 16.2 g/dL — ABNORMAL HIGH (ref 12.0–15.0)
MCH: 32.9 pg (ref 26.0–34.0)
MCHC: 35.4 g/dL (ref 30.0–36.0)
MCV: 92.7 fL (ref 78.0–100.0)
Platelets: 417 10*3/uL — ABNORMAL HIGH (ref 150–400)
RBC: 4.93 MIL/uL (ref 3.87–5.11)
RDW: 12.2 % (ref 11.5–15.5)
WBC: 13.3 10*3/uL — ABNORMAL HIGH (ref 4.0–10.5)

## 2011-06-23 LAB — POCT I-STAT TROPONIN I: Troponin i, poc: 0.01 ng/mL (ref 0.00–0.08)

## 2011-06-23 MED ORDER — SODIUM CHLORIDE 0.9 % IV BOLUS (SEPSIS)
1000.0000 mL | Freq: Once | INTRAVENOUS | Status: AC
  Administered 2011-06-23: 1000 mL via INTRAVENOUS

## 2011-06-23 NOTE — ED Notes (Signed)
Pt reports recent treatment for UTI, states finished course of antibiotics on Sunday. States fever persists with low back pain and nausea.

## 2011-06-23 NOTE — ED Notes (Signed)
Attempt IVx2 no success. Iv therapy paged.

## 2011-06-23 NOTE — ED Provider Notes (Addendum)
History     CSN: [id number]  Arrival date & time 06/23/11  2057   First MD Initiated Contact with Patient 06/23/11 2151      Chief Complaint  Patient presents with  . Back Pain  . Fever    (Consider location/radiation/quality/duration/timing/severity/associated sxs/prior treatment) HPI Comments: Patient states, that for the last week, and a half.  She has been ill with wanting or the other started with oral thrush.  She was treated with an oral medication, but really didn't help her sore throat, then.  She was seen in the emergency room and diagnosed with a UTI treated with Macrobid, which was the appropriate medication.  Once the culture was reviewed.  This did initially improve her symptoms until she finished the medication.  One day later, she awakened I can with left posterior upper chest pain, cough, nausea, and subjective fevers with diaphoresis.  She is a well controlled type I diabetic, who is well-versed in her medication.  She states that her sugars have been running over 300 despite her not eating as well as normal and increasing her insulin coverage.  The history is provided by the patient.    Past Medical History  Diagnosis Date  . Diabetes mellitus   . Fibromyalgia   . Arthritis   . Hyperlipidemia   . Chills   . Abdominal pain   . Nausea & vomiting   . Biliary dyskinesia   . Depression   . Migraine   . Joint pain   . Skin rash     due to medications  . Urination, excessive at night   . Post traumatic stress disorder (PTSD)   . Sleep trouble     Past Surgical History  Procedure Date  . Tonsillectomy 2001  . Lipoma removed   . Knee surgery   . Cesarean section 1997, 1999  . Abdominal hysterectomy 2001  . Tumor removal 2000    lower back  . Eye surgery 2006    laser eye surgery left eye  . Cholecystectomy 02/10/2011    Procedure: LAPAROSCOPIC CHOLECYSTECTOMY WITH INTRAOPERATIVE CHOLANGIOGRAM;  Surgeon: Rulon Abide, DO;  Location: [facility] OR;  Service:  General;  Laterality: N/A;    Family History  Problem Relation Age of Onset  . Cancer Father     lymphoma  . Cancer Maternal Grandmother     colon    History  Substance Use Topics  . Smoking status: Never Smoker   . Smokeless tobacco: Never Used  . Alcohol Use: No    OB History    Grav Para Term Preterm Abortions TAB SAB Ect Mult Living                  Review of Systems  Constitutional: Positive for fever and chills.  HENT: Negative for rhinorrhea.   Eyes: Negative for visual disturbance.  Respiratory: Positive for cough and shortness of breath. Negative for wheezing.   Cardiovascular: Positive for chest pain. Negative for palpitations.  Gastrointestinal: Positive for nausea. Negative for vomiting, abdominal pain, diarrhea and constipation.  Genitourinary: Negative for dysuria, frequency and vaginal discharge.  Musculoskeletal: Positive for myalgias and back pain.  Skin: Positive for pallor. Negative for rash and wound.  Neurological: Negative for dizziness, weakness and headaches.    Allergies  Ibuprofen; Tylenol; and Cephalexin  Home Medications   Current Outpatient Rx  Name Route Sig Dispense Refill  . CITALOPRAM HYDROBROMIDE 40 MG PO TABS Oral Take 40 mg by mouth at bedtime.     Marland Kitchen  INSULIN ASPART 100 UNIT/ML [place] SOLN Subcutaneous Inject 0-5 Units into the skin 3 (three) times daily before meals.     . INSULIN GLARGINE 100 UNIT/ML [place] SOLN Subcutaneous Inject 24 Units into the skin at bedtime.     Marland Kitchen NAPROXEN SODIUM 220 MG PO TABS Oral Take 220 mg by mouth 2 (two) times daily as needed. For pain.    . OXYCODONE HCL 5 MG PO TABS Oral Take 1 tablet (5 mg total) by mouth every 4 (four) hours as needed for pain. 9 tablet 0  . PROMETHAZINE HCL 25 MG PO TABS Oral Take 1 tablet (25 mg total) by mouth every 6 (six) hours as needed for nausea. 30 tablet 0  . NITROFURANTOIN MONOHYD MACRO 100 MG PO CAPS Oral Take 1 capsule (100 mg total) by mouth 2 (two) times daily. 10 capsule  0    BP 138/63  Pulse 81  Temp(Src) 98.6 F (37 C) (Oral)  Resp 17  SpO2 100%  Physical Exam  Constitutional: She appears well-developed.  Eyes: Pupils are equal, round, and reactive to light.  Neck: Normal range of motion.  Cardiovascular: Normal rate.   Abdominal: Soft. She exhibits no distension. There is no tenderness.  Musculoskeletal: Normal range of motion.  Neurological: She is alert.  Skin: Skin is warm.    ED Course  Procedures (including critical care time)  Labs Reviewed  CBC - Abnormal; Notable for the following:    WBC 13.3 (*)    Hemoglobin 16.2 (*)    Platelets 417 (*)    All other components within normal limits  DIFFERENTIAL - Abnormal; Notable for the following:    Neutro Abs 8.3 (*)    All other components within normal limits  BASIC METABOLIC PANEL - Abnormal; Notable for the following:    Sodium 131 (*)    Potassium 5.9 (*) MODERATE HEMOLYSIS   Chloride 94 (*)    Glucose, Bld 204 (*)    All other components within normal limits  URINALYSIS, ROUTINE W REFLEX MICROSCOPIC - Abnormal; Notable for the following:    APPearance CLOUDY (*)    Specific Gravity, Urine 1.037 (*)    Glucose, UA >1000 (*)    Protein, ur 30 (*)    All other components within normal limits  URINE MICROSCOPIC-ADD ON - Abnormal; Notable for the following:    Squamous Epithelial / LPF MANY (*)    All other components within normal limits  GLUCOSE, CAPILLARY - Abnormal; Notable for the following:    Glucose-Capillary 56 (*)    All other components within normal limits  GLUCOSE, CAPILLARY - Abnormal; Notable for the following:    Glucose-Capillary 108 (*)    All other components within normal limits  POCT I-STAT TROPONIN I  LIPASE, BLOOD  POTASSIUM   Dg Chest 2 View  06/23/2011  *RADIOLOGY REPORT*  Clinical Data: The left posterior back pain.  Fever and shortness of breath.  CHEST - 2 VIEW  Comparison: Chest x-ray 05/17/2011.  Findings: Lung volumes are normal.  No  consolidative airspace disease.  No pleural effusions.  No pneumothorax.  No pulmonary nodule or mass noted.  Pulmonary vasculature and the cardiomediastinal silhouette are within normal limits.  Surgical clips project over the right upper quadrant of the abdomen, likely from prior cholecystectomy.  IMPRESSION: 1. No radiographic evidence of acute cardiopulmonary disease.  Original Report Authenticated By: Florencia Reasons, M.D.     1. Chest wall pain   2. Nausea  Patient says she feels much, better, informed her that her labs and x-ray were normal.  She is relieved.  She will follow up with her primary care physician as needed   MDM   Is some concern for pneumonia with this patient, despite her recent antibiotic use 2 to posterior left upper chest pain with cough and subjective feeling of shortness of breath, myalgias, fever, and diaphoresis        Arman Filter, NP 06/25/11 0331  Arman Filter, NP 06/25/11 0331  Arman Filter, NP 10/02/11 0130

## 2011-06-23 NOTE — ED Notes (Signed)
Pt states seen for the same symptoms last week. Pt c/o of upper and lower back pain since Monday. Pt states had fever at home 99.8. Denies vomiting states had nausea.

## 2011-06-24 ENCOUNTER — Telehealth: Payer: Self-pay

## 2011-06-24 LAB — GLUCOSE, CAPILLARY
Glucose-Capillary: 56 mg/dL — ABNORMAL LOW (ref 70–99)
Glucose-Capillary: 108 mg/dL — ABNORMAL HIGH (ref 70–99)

## 2011-06-24 LAB — POTASSIUM: Potassium: 4.1 mEq/L (ref 3.5–5.1)

## 2011-06-24 LAB — LIPASE, BLOOD: Lipase: 17 U/L (ref 11–59)

## 2011-06-24 NOTE — ED Notes (Signed)
Fsbs 108

## 2011-06-24 NOTE — ED Notes (Signed)
Pt called per call light states BS is dropping. Pt alert x3 skin clammy. Pt given OJ x2 and sandwich. Dondra Spry PA informed.

## 2011-06-24 NOTE — ED Notes (Signed)
Pt states feeling better. Skin w/d alert x3

## 2011-06-24 NOTE — ED Notes (Signed)
Ambulate without distress with husband.

## 2011-06-24 NOTE — Telephone Encounter (Signed)
Pt went to ER last night, requesting refill on oxyCODONE (ROXICODONE) 5 MG immediate release tablet For pain.

## 2011-06-24 NOTE — Telephone Encounter (Signed)
Please advise, Dr. Patsy Lager OOT.

## 2011-06-25 NOTE — Telephone Encounter (Signed)
Sorry.  We can not prescribe this.  Dr. Patsy Lager will have to address.

## 2011-06-25 NOTE — Telephone Encounter (Signed)
It looks like she received more from the ED on 5/28.  I really do not think that narcotics are appropriate for fibromyalgia pain.  She may come in for a recheck if she wants, but I am not going to refill this medication especially without seeing her

## 2011-06-25 NOTE — Telephone Encounter (Signed)
Spoke with patient and notified info below.  Patient does not have appt with rheum until next Wednesday.  Will go to ER in meanwhile if pain gets too severe--note to MD to advise.

## 2011-06-25 NOTE — Telephone Encounter (Signed)
Called patient, n/a or VM. 

## 2011-06-26 NOTE — ED Provider Notes (Signed)
Medical screening examination/treatment/procedure(s) were performed by non-physician practitioner and as supervising physician I was immediately available for consultation/collaboration.   Lyanne Co, MD 06/26/11 0130

## 2011-06-26 NOTE — Telephone Encounter (Signed)
Dr Patsy Lager, this was in my Open Encounters folder, but I didn't see it still in your In Basket when I checked. Are you through w/this message?

## 2011-06-26 NOTE — Telephone Encounter (Signed)
Lm on mobile to cb.

## 2011-06-26 NOTE — Telephone Encounter (Signed)
Yes- I had sent a reply back to I think Maureen [name]- she had called requesting more oxycodone for her fibromyalgia pain.  If she has not been called back yet please give her a call.  I am sorry that she is having pain, but I am not comfortable giving her more oxycodone.  I saw that she had gotten more oxycodone at the ED since our last clinic vist.  I do not think that she should be regularly using oxycodone for this pain, and had meant her to use it only for emergencies.  If she would like to come in for a recheck we are glad to see her, but again I do not think that twice daily oxycodone is a good plan for her pain.

## 2011-06-28 NOTE — Telephone Encounter (Signed)
Called patient, no VM set up.

## 2011-06-29 NOTE — Telephone Encounter (Signed)
Gave pt info from Dr Patsy Lager, and she said that she understands and luckily she doesn't hurt today and it was a good weekend. She is frustrated bc she doesn't have the money to keep coming in here and to the ED, and she has 4 chronic conditions that cause her pain. States she doesn't need anything QD but when she has flare-ups she does, and she feels like the MDs do not look at the entire picture. I advised pt that we would be happy to refer her to a pain clinic if she does not get resolution from rheum, and advised pt to CB if she would like [place] to do that. Pt agreed.

## 2011-07-16 ENCOUNTER — Encounter (HOSPITAL_COMMUNITY): Payer: Self-pay | Admitting: *Deleted

## 2011-07-16 ENCOUNTER — Emergency Department (HOSPITAL_COMMUNITY)
Admission: EM | Admit: 2011-07-16 | Discharge: 2011-07-17 | Disposition: A | Discharge status: Home / Self Care | Payer: Managed Care, Other (non HMO) | Attending: Emergency Medicine | Admitting: Emergency Medicine

## 2011-07-16 DIAGNOSIS — Z8739 Personal history of other diseases of the musculoskeletal system and connective tissue: Secondary | ICD-10-CM | POA: Insufficient documentation

## 2011-07-16 DIAGNOSIS — R109 Unspecified abdominal pain: Secondary | ICD-10-CM

## 2011-07-16 DIAGNOSIS — R112 Nausea with vomiting, unspecified: Secondary | ICD-10-CM | POA: Insufficient documentation

## 2011-07-16 DIAGNOSIS — E785 Hyperlipidemia, unspecified: Secondary | ICD-10-CM | POA: Insufficient documentation

## 2011-07-16 DIAGNOSIS — IMO0001 Reserved for inherently not codable concepts without codable children: Secondary | ICD-10-CM | POA: Insufficient documentation

## 2011-07-16 DIAGNOSIS — Z794 Long term (current) use of insulin: Secondary | ICD-10-CM | POA: Insufficient documentation

## 2011-07-16 DIAGNOSIS — E119 Type 2 diabetes mellitus without complications: Secondary | ICD-10-CM | POA: Insufficient documentation

## 2011-07-16 LAB — URINALYSIS, ROUTINE W REFLEX MICROSCOPIC
Bilirubin Urine: NEGATIVE
Glucose, UA: >1000 mg/dL — AB
Ketones, ur: NEGATIVE mg/dL
Leukocytes, UA: NEGATIVE
Nitrite: NEGATIVE
Protein, ur: 100 mg/dL — AB
Specific Gravity, Urine: 1.031 — ABNORMAL HIGH (ref 1.005–1.030)
Urobilinogen, UA: 0.2 mg/dL (ref 0.0–1.0)
pH: 5 (ref 5.0–8.0)

## 2011-07-16 LAB — CBC WITH DIFFERENTIAL/PLATELET
Basophils Absolute: 0.1 10*3/uL (ref 0.0–0.1)
Basophils Relative: 0 % (ref 0–1)
Eosinophils Absolute: 0.2 10*3/uL (ref 0.0–0.7)
Eosinophils Relative: 1 % (ref 0–5)
HCT: 41.6 % (ref 36.0–46.0)
Hemoglobin: 14.6 g/dL (ref 12.0–15.0)
Lymphocytes Relative: 26 % (ref 12–46)
Lymphs Abs: 3.7 10*3/uL (ref 0.7–4.0)
MCH: 32.7 pg (ref 26.0–34.0)
MCHC: 35.1 g/dL (ref 30.0–36.0)
MCV: 93.3 fL (ref 78.0–100.0)
Monocytes Absolute: 0.9 10*3/uL (ref 0.1–1.0)
Monocytes Relative: 6 % (ref 3–12)
Neutro Abs: 9.5 10*3/uL — ABNORMAL HIGH (ref 1.7–7.7)
Neutrophils Relative %: 67 % (ref 43–77)
Platelets: 431 10*3/uL — ABNORMAL HIGH (ref 150–400)
RBC: 4.46 MIL/uL (ref 3.87–5.11)
RDW: 12.4 % (ref 11.5–15.5)
WBC: 14.3 10*3/uL — ABNORMAL HIGH (ref 4.0–10.5)

## 2011-07-16 LAB — COMPREHENSIVE METABOLIC PANEL
ALT: 23 U/L (ref 0–35)
AST: 14 U/L (ref 0–37)
Albumin: 3.5 g/dL (ref 3.5–5.2)
Alkaline Phosphatase: 130 U/L — ABNORMAL HIGH (ref 39–117)
BUN: 8 mg/dL (ref 6–23)
CO2: 25 mEq/L (ref 19–32)
Calcium: 9.5 mg/dL (ref 8.4–10.5)
Chloride: 99 mEq/L (ref 96–112)
Creatinine, Ser: 0.77 mg/dL (ref 0.50–1.10)
GFR calc Af Amer: >90 mL/min (ref >90)
GFR calc non Af Amer: >90 mL/min (ref >90)
Glucose, Bld: 209 mg/dL — ABNORMAL HIGH (ref 70–99)
Potassium: 3.6 mEq/L (ref 3.5–5.1)
Sodium: 134 mEq/L — ABNORMAL LOW (ref 135–145)
Total Bilirubin: 0.3 mg/dL (ref 0.3–1.2)
Total Protein: 7.7 g/dL (ref 6.0–8.3)

## 2011-07-16 LAB — URINE MICROSCOPIC-ADD ON

## 2011-07-16 MED ORDER — ONDANSETRON 8 MG PO TBDP
8.0000 mg | ORAL_TABLET | Freq: Once | ORAL | Status: AC
  Administered 2011-07-16: 8 mg via ORAL
  Filled 2011-07-16: qty 1

## 2011-07-16 NOTE — ED Notes (Signed)
Pt states she started to have right side flank pain last night. Pt states she is having nausea but no emesis. Pt states pain radiates to her pelvic area

## 2011-07-17 ENCOUNTER — Emergency Department (HOSPITAL_COMMUNITY): Payer: Managed Care, Other (non HMO)

## 2011-07-17 MED ORDER — OXYCODONE HCL 5 MG PO TABS: 5.0000 mg | ORAL_TABLET | ORAL | Status: DC | PRN

## 2011-07-17 NOTE — Discharge Instructions (Signed)
Your ct scan was read as follows:   Pelvis: The uterus is retroverted and is not enlarged. There is gas in the lower uterine segment endometrium. Cystic structure in the left adnexal region measuring 2.5 cm and likely representing an ovarian cyst. This is stable since previous study. The bladder wall appears mildly thickened and there is tenting of the dome of the bladder. This suggest urachal remnant. There is infiltration in the soft tissues anterior to the dome of the bladder with focal nodular enlargement of the urachal remnant. This could represent infection, hematoma, or mass lesion. Urachal cancer should be excluded. Changes demonstrate some progression since the previous study. No free pelvic fluid collections. The appendix is normal. No evidence of diverticulitis. Normal alignment of the lumbar vertebrae.  IMPRESSION: Punctate nonobstructing stone in the left kidney. No obstructing ureteral stones are demonstrated. The bladder wall is thickened which may suggest cystitis or bladder hypertrophy. 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 Urachal carcinoma or infection should be excluded. Gas in the lower uterine segment endometrium.   For concern of and further evaluation of the noted bladder wall/urachal thickening, follow up with urologist in the next couple weeks - call office tomorrow to arrange follow up - have them review your ct scan at follow up.  Your blood sugar is high (209). Drink plenty of water, follow diabetic diet, take your diabetic medication as prescribed, check sugars regularly and record values, and follow up with primary care doctor for recheck in the next couple days.  You may take oxycodone as need for pain - no driving when taking this medication.  Follow up with your doctor in the next 1-2 days for recheck.  Return to ER if worse, worsening or severe pain, fevers,  vomiting, other concern.     Flank Pain Flank pain refers to pain that is located on the side of the body between the upper abdomen and the back. It can be caused by many things. CAUSES  Some of the more common causes of flank pain include:  Muscle strain.   Muscle spasms.   A disease of your spine (vertebral disk disease).   A lung infection (pneumonia).   Fluid around your lungs (pulmonary edema).   A kidney infection.   Kidney stones.   A very painful skin rash on only one side of your body (shingles).   Gallbladder disease.  DIAGNOSIS  Blood tests, urine tests, and X-rays may help your caregiver determine what is wrong. TREATMENT  The treatment of pain depends on the cause. Your caregiver will determine what treatment will work best for you. HOME CARE INSTRUCTIONS   Home care will depend on the cause of your pain.   Some medications may help relieve the pain. Take medication for relief of pain as directed by your caregiver.   Tell your caregiver about any changes in your pain.   Follow up with your caregiver.  SEEK IMMEDIATE MEDICAL CARE IF:   Your pain is not controlled with medication.   The pain increases.   You have abdominal pain.   You have shortness of breath.   You have persistent nausea or vomiting.   You have swelling in your abdomen.   You feel faint or pass out.   You have a temperature by mouth above 102 F (38.9 C), not controlled by medicine.  MAKE SURE YOU:   Understand these instructions.   Will watch  your condition.   Will get help right away if you are not doing well or get worse.  Document Released: 02/26/2005 Document Revised: 12/25/2010 Document Reviewed: 06/22/2009 [facility] Patient Information 2012 [place], [place].      Hyperglycemia Hyperglycemia occurs when the glucose (sugar) in your blood is too high. Hyperglycemia can happen for many reasons, but it most often happens to people who do not know they have diabetes  or are not managing their diabetes properly.  CAUSES  Whether you have diabetes or not, there are other causes of hyperglycemia. Hyperglycemia can occur when you have diabetes, but it can also occur in other situations that you might not be as aware of, such as: Diabetes  If you have diabetes and are having problems controlling your blood glucose, hyperglycemia could occur because of some of the following reasons:   Not following your meal plan.   Not taking your diabetes medications or not taking it properly.   Exercising less or doing less activity than you normally do.   Being sick.  Pre-diabetes  This cannot be ignored. Before people develop Type 2 diabetes, they almost always have "pre-diabetes." This is when your blood glucose levels are higher than normal, but not yet high enough to be diagnosed as diabetes. Research has shown that some long-term damage to the body, especially the heart and circulatory system, may already be occurring during pre-diabetes. If you take action to manage your blood glucose when you have pre-diabetes, you may delay or prevent Type 2 diabetes from developing.  Stress  If you have diabetes, you may be "diet" controlled or on oral medications or insulin to control your diabetes. However, you may find that your blood glucose is higher than usual in the hospital whether you have diabetes or not. This is often referred to as "stress hyperglycemia." Stress can elevate your blood glucose. This happens because of hormones put out by the body during times of stress. If stress has been the cause of your high blood glucose, it can be followed regularly by your caregiver. That way he/she can make sure your hyperglycemia does not continue to get worse or progress to diabetes.  Steroids  Steroids are medications that act on the infection fighting system (immune system) to block inflammation or infection. One side effect can be a rise in blood glucose. Most people can  produce enough extra insulin to allow for this rise, but for those who cannot, steroids make blood glucose levels go even higher. It is not unusual for steroid treatments to "uncover" diabetes that is developing. It is not always possible to determine if the hyperglycemia will go away after the steroids are stopped. A special blood test called an A1c is sometimes done to determine if your blood glucose was elevated before the steroids were started.  SYMPTOMS  Thirsty.   Frequent urination.   Dry mouth.   Blurred vision.   Tired or fatigue.   Weakness.   Sleepy.   Tingling in feet or leg.  DIAGNOSIS  Diagnosis is made by monitoring blood glucose in one or all of the following ways:  A1c test. This is a chemical found in your blood.   Fingerstick blood glucose monitoring.   Laboratory results.  TREATMENT  First, knowing the cause of the hyperglycemia is important before the hyperglycemia can be treated. Treatment may include, but is not be limited to:  Education.   Change or adjustment in medications.   Change or adjustment in meal plan.  Treatment for an illness, infection, etc.   More frequent blood glucose monitoring.   Change in exercise plan.   Decreasing or stopping steroids.   Lifestyle changes.  HOME CARE INSTRUCTIONS   Test your blood glucose as directed.   Exercise regularly. Your caregiver will give you instructions about exercise. Pre-diabetes or diabetes which comes on with stress is helped by exercising.   Eat wholesome, balanced meals. Eat often and at regular, fixed times. Your caregiver or nutritionist will give you a meal plan to guide your sugar intake.   Being at an ideal weight is important. If needed, losing as little as 10 to 15 pounds may help improve blood glucose levels.  SEEK MEDICAL CARE IF:   You have questions about medicine, activity, or diet.   You continue to have symptoms (problems such as increased thirst, urination, or weight  gain).  SEEK IMMEDIATE MEDICAL CARE IF:   You are vomiting or have diarrhea.   Your breath smells fruity.   You are breathing faster or slower.   You are very sleepy or incoherent.   You have numbness, tingling, or pain in your feet or hands.   You have chest pain.   Your symptoms get worse even though you have been following your caregiver's orders.   If you have any other questions or concerns.  Document Released: 07/01/2000 Document Revised: 12/25/2010 Document Reviewed: 08/27/2008 [facility] Patient Information 2012 [place], [place].     Ovarian Cyst The ovaries are small organs that are on each side of the uterus. The ovaries are the organs that produce the female hormones, estrogen and progesterone. An ovarian cyst is a sac filled with fluid that can vary in its size. It is normal for a small cyst to form in women who are in the childbearing age and who have menstrual periods. This type of cyst is called a follicle cyst that becomes an ovulation cyst (corpus luteum cyst) after it produces the women's egg. It later goes away on its own if the woman does not become pregnant. There are other kinds of ovarian cysts that may cause problems and may need to be treated. The most serious problem is a cyst with cancer. It should be noted that menopausal women who have an ovarian cyst are at a higher risk of it being a cancer cyst. They should be evaluated very quickly, thoroughly and followed closely. This is especially true in menopausal women because of the high rate of ovarian cancer in women in menopause. CAUSES AND TYPES OF OVARIAN CYSTS:  FUNCTIONAL CYST: The follicle/corpus luteum cyst is a functional cyst that occurs every month during ovulation with the menstrual cycle. They go away with the next menstrual cycle if the woman does not get pregnant. Usually, there are no symptoms with a functional cyst.   ENDOMETRIOMA CYST: This cyst develops from the lining of the uterus tissue. This  cyst gets in or on the ovary. It grows every month from the bleeding during the menstrual period. It is also called a "chocolate cyst" because it becomes filled with blood that turns brown. This cyst can cause pain in the lower abdomen during intercourse and with your menstrual period.   CYSTADENOMA CYST: This cyst develops from the cells on the outside of the ovary. They usually are not cancerous. They can get very big and cause lower abdomen pain and pain with intercourse. This type of cyst can twist on itself, cut off its blood supply and cause severe pain. It  also can easily rupture and cause a lot of pain.   DERMOID CYST: This type of cyst is sometimes found in both ovaries. They are found to have different kinds of body tissue in the cyst. The tissue includes skin, teeth, hair, and/or cartilage. They usually do not have symptoms unless they get very big. Dermoid cysts are rarely cancerous.   POLYCYSTIC OVARY: This is a rare condition with hormone problems that produces many small cysts on both ovaries. The cysts are follicle-like cysts that never produce an egg and become a corpus luteum. It can cause an increase in body weight, infertility, acne, increase in body and facial hair and lack of menstrual periods or rare menstrual periods. Many women with this problem develop type 2 diabetes. The exact cause of this problem is unknown. A polycystic ovary is rarely cancerous.   THECA LUTEIN CYST: Occurs when too much hormone (human chorionic gonadotropin) is produced and over-stimulates the ovaries to produce an egg. They are frequently seen when doctors stimulate the ovaries for invitro-fertilization (test tube babies).   LUTEOMA CYST: This cyst is seen during pregnancy. Rarely it can cause an obstruction to the birth canal during labor and delivery. They usually go away after delivery.  SYMPTOMS   Pelvic pain or pressure.   Pain during sexual intercourse.   Increasing girth (swelling) of the  abdomen.   Abnormal menstrual periods.   Increasing pain with menstrual periods.   You stop having menstrual periods and you are not pregnant.  DIAGNOSIS  The diagnosis can be made during:  Routine or annual pelvic examination (common).   Ultrasound.   X-ray of the pelvis.   CT Scan.   MRI.   Blood tests.  TREATMENT   Treatment may only be to follow the cyst monthly for 2 to 3 months with your caregiver. Many go away on their own, especially functional cysts.   May be aspirated (drained) with a long needle with ultrasound, or by laparoscopy (inserting a tube into the pelvis through a small incision).   The whole cyst can be removed by laparoscopy.   Sometimes the cyst may need to be removed through an incision in the lower abdomen.   Hormone treatment is sometimes used to help dissolve certain cysts.   Birth control pills are sometimes used to help dissolve certain cysts.  HOME CARE INSTRUCTIONS  Follow your caregiver's advice regarding:  Medicine.   Follow up visits to evaluate and treat the cyst.   You may need to come back or make an appointment with another caregiver, to find the exact cause of your cyst, if your caregiver is not a gynecologist.   Get your yearly and recommended pelvic examinations and Pap tests.   Let your caregiver know if you have had an ovarian cyst in the past.  SEEK MEDICAL CARE IF:   Your periods are late, irregular, they stop, or are painful.   Your stomach (abdomen) or pelvic pain does not go away.   Your stomach becomes larger or swollen.   You have pressure on your bladder or trouble emptying your bladder completely.   You have painful sexual intercourse.   You have feelings of fullness, pressure, or discomfort in your stomach.   You lose weight for no apparent reason.   You feel generally ill.   You become constipated.   You lose your appetite.   You develop acne.   You have an increase in body and facial hair.    You are  gaining weight, without changing your exercise and eating habits.   You think you are pregnant.  SEEK IMMEDIATE MEDICAL CARE IF:   You have increasing abdominal pain.   You feel sick to your stomach (nausea) and/or vomit.   You develop a fever that comes on suddenly.   You develop abdominal pain during a bowel movement.   Your menstrual periods become heavier than usual.  Document Released: 01/05/2005 Document Revised: 12/25/2010 Document Reviewed: 11/08/2008 [facility] Patient Information 2012 [place], [place].

## 2011-07-17 NOTE — ED Provider Notes (Signed)
History     CSN: [id number]  Arrival date & time 07/16/11  2217   First MD Initiated Contact with Patient 07/17/11 0000      Chief Complaint  Patient presents with  . Flank Pain    (Consider location/radiation/quality/duration/timing/severity/associated sxs/prior treatment) Patient is a 41 y.o. female presenting with flank pain. The history is provided by the patient.  Flank Pain Pertinent negatives include no chest pain, no abdominal pain, no headaches and no shortness of breath.  pt c/o right flank pain laterally/post onset last night. Acute onset. Constant. Dull. No specific exacerbating or alleviating factors. No anterior abd/pelvic pain. Hx hysterectomy, cholecystectomy. No hx kidney stones. No dysuria or hematuria. No vaginal discharge or bleeding. No abd or back trauma or strain. No cp or sob. No cough or uri c/o. Denies fever or chills. Normal appetite.   Past Medical History  Diagnosis Date  . Diabetes mellitus   . Fibromyalgia   . Arthritis   . Hyperlipidemia   . Chills   . Abdominal pain   . Nausea & vomiting   . Biliary dyskinesia   . Depression   . Migraine   . Joint pain   . Skin rash     due to medications  . Urination, excessive at night   . Post traumatic stress disorder (PTSD)   . Sleep trouble     Past Surgical History  Procedure Date  . Tonsillectomy 2001  . Lipoma removed   . Knee surgery   . Cesarean section 1997, 1999  . Abdominal hysterectomy 2001  . Tumor removal 2000    lower back  . Eye surgery 2006    laser eye surgery left eye  . Cholecystectomy 02/10/2011    Procedure: LAPAROSCOPIC CHOLECYSTECTOMY WITH INTRAOPERATIVE CHOLANGIOGRAM;  Surgeon: Rulon Abide, DO;  Location: [facility] OR;  Service: General;  Laterality: N/A;    Family History  Problem Relation Age of Onset  . Cancer Father     lymphoma  . Cancer Maternal Grandmother     colon    History  Substance Use Topics  . Smoking status: Never Smoker   . Smokeless tobacco:  Never Used  . Alcohol Use: No    OB History    Grav Para Term Preterm Abortions TAB SAB Ect Mult Living                  Review of Systems  Constitutional: Negative for fever and chills.  HENT: Negative for neck pain.   Eyes: Negative for redness.  Respiratory: Negative for cough and shortness of breath.   Cardiovascular: Negative for chest pain and leg swelling.  Gastrointestinal: Negative for vomiting, abdominal pain, diarrhea and constipation.  Genitourinary: Positive for flank pain. Negative for dysuria.  Musculoskeletal: Negative for arthralgias.  Skin: Negative for rash.  Neurological: Negative for headaches.  Hematological: Does not bruise/bleed easily.  Psychiatric/Behavioral: Negative for confusion.    Allergies  Ibuprofen; Tylenol; and Cephalexin  Home Medications   Current Outpatient Rx  Name Route Sig Dispense Refill  . DULOXETINE HCL 60 MG PO CPEP Oral Take 60 mg by mouth daily.    . INSULIN ASPART 100 UNIT/ML [place] SOLN Subcutaneous Inject 0-5 Units into the skin 3 (three) times daily before meals.     . INSULIN GLARGINE 100 UNIT/ML [place] SOLN Subcutaneous Inject 24 Units into the skin at bedtime.       BP 154/78  Pulse 83  Temp 99.2 F (37.3 C) (Oral)  Resp 22  Ht 5\' 4"  (1.626 m)  Wt 180 lb (81.647 kg)  BMI 30.90 kg/m2  SpO2 100%  Physical Exam  Nursing note and vitals reviewed. Constitutional: She is oriented to person, place, and time. She appears well-developed and well-nourished. No distress.  HENT:  Nose: Nose normal.  Mouth/Throat: Oropharynx is clear and moist.  Eyes: Conjunctivae are normal. No scleral icterus.  Neck: Neck supple. No tracheal deviation present.  Cardiovascular: Normal rate, regular rhythm, normal heart sounds and intact distal pulses.  Exam reveals no gallop and no friction rub.   No murmur heard. Pulmonary/Chest: Effort normal and breath sounds normal. No respiratory distress.  Abdominal: Soft. Normal appearance and bowel  sounds are normal. She exhibits no distension and no mass. There is no tenderness. There is no rebound and no guarding.       No hernia.   Genitourinary:       No cva tenderness  Musculoskeletal: She exhibits no edema and no tenderness.       Spine non tender  Neurological: She is alert and oriented to person, place, and time.       Motor intact bil.   Skin: Skin is warm and dry. No rash noted.       No skin changes, rash/vesicles, or erythema in area of pain  Psychiatric: She has a normal mood and affect.    ED Course  Procedures (including critical care time)  Results for orders placed during the hospital encounter of 07/16/11  CBC WITH DIFFERENTIAL      Component Value Range   WBC 14.3 (*) 4.0 - 10.5 K/uL   RBC 4.46  3.87 - 5.11 MIL/uL   Hemoglobin 14.6  12.0 - 15.0 g/dL   HCT 16.1  09.6 - 04.5 %   MCV 93.3  78.0 - 100.0 fL   MCH 32.7  26.0 - 34.0 pg   MCHC 35.1  30.0 - 36.0 g/dL   RDW 40.9  81.1 - 91.4 %   Platelets 431 (*) 150 - 400 K/uL   Neutrophils Relative 67  43 - 77 %   Neutro Abs 9.5 (*) 1.7 - 7.7 K/uL   Lymphocytes Relative 26  12 - 46 %   Lymphs Abs 3.7  0.7 - 4.0 K/uL   Monocytes Relative 6  3 - 12 %   Monocytes Absolute 0.9  0.1 - 1.0 K/uL   Eosinophils Relative 1  0 - 5 %   Eosinophils Absolute 0.2  0.0 - 0.7 K/uL   Basophils Relative 0  0 - 1 %   Basophils Absolute 0.1  0.0 - 0.1 K/uL  COMPREHENSIVE METABOLIC PANEL      Component Value Range   Sodium 134 (*) 135 - 145 mEq/L   Potassium 3.6  3.5 - 5.1 mEq/L   Chloride 99  96 - 112 mEq/L   CO2 25  19 - 32 mEq/L   Glucose, Bld 209 (*) 70 - 99 mg/dL   BUN 8  6 - 23 mg/dL   Creatinine, Ser 7.82  0.50 - 1.10 mg/dL   Calcium 9.5  8.4 - 95.6 mg/dL   Total Protein 7.7  6.0 - 8.3 g/dL   Albumin 3.5  3.5 - 5.2 g/dL   AST 14  0 - 37 U/L   ALT 23  0 - 35 U/L   Alkaline Phosphatase 130 (*) 39 - 117 U/L   Total Bilirubin 0.3  0.3 - 1.2 mg/dL   GFR calc non Af Amer >90  >  90 mL/min   GFR calc Af Amer >90  >90  mL/min  URINALYSIS, ROUTINE W REFLEX MICROSCOPIC      Component Value Range   Color, Urine YELLOW  YELLOW   APPearance CLEAR  CLEAR   Specific Gravity, Urine 1.031 (*) 1.005 - 1.030   pH 5.0  5.0 - 8.0   Glucose, UA >1000 (*) NEGATIVE mg/dL   Hgb urine dipstick TRACE (*) NEGATIVE   Bilirubin Urine NEGATIVE  NEGATIVE   Ketones, ur NEGATIVE  NEGATIVE mg/dL   Protein, ur 454 (*) NEGATIVE mg/dL   Urobilinogen, UA 0.2  0.0 - 1.0 mg/dL   Nitrite NEGATIVE  NEGATIVE   Leukocytes, UA NEGATIVE  NEGATIVE  URINE MICROSCOPIC-ADD ON      Component Value Range   Squamous Epithelial / LPF FEW (*) RARE   RBC / HPF 0-2  <3 RBC/hpf   Bacteria, UA RARE  RARE   Urine-Other MUCOUS PRESENT     Ct Abdomen Pelvis Wo Contrast  07/17/2011  *RADIOLOGY REPORT*  Clinical Data: Right flank pain.  Nausea and vomiting.  White cell count 14.3.  Microhematuria.  CT ABDOMEN AND PELVIS WITHOUT CONTRAST  Technique:  Multidetector CT imaging of the abdomen and pelvis was performed following the standard protocol without intravenous contrast.  Comparison: 03/07/2007  Findings: The lung bases are clear.  The kidneys appear symmetrical in size and shape.  There is a punctate stone in the upper mid pole of the left kidney.  No pyelocaliectasis or ureterectasis on either side.  No ureteral stones or bladder stones are visualized.  Surgical absence of the gallbladder.  The unenhanced appearance of the liver, spleen, pancreas, adrenal glands, abdominal aorta, and retroperitoneal lymph nodes is unremarkable.  The stomach and small bowel are mostly decompressed.  Stool filled colon without distension.  No free air or free fluid in the abdomen.  Scattered mesenteric lymph nodes are not pathologically enlarged and may be reactive.  Minimal mesenteric infiltration or edema.  Pelvis:  The uterus is retroverted and is not enlarged.  There is gas in the lower uterine segment endometrium.  Cystic structure in the left adnexal region measuring 2.5  cm and likely representing an ovarian cyst.  This is stable since previous study.  The bladder wall appears mildly thickened and there is tenting of the dome of the bladder.  This suggest urachal remnant.  There is infiltration in the soft tissues anterior to the dome of the bladder with focal nodular enlargement of the urachal remnant.  This could represent infection, hematoma, or mass lesion. Urachal cancer should be excluded.  Changes demonstrate some progression since the previous study.  No free pelvic fluid collections.  The appendix is normal. No evidence of diverticulitis.  Normal alignment of the lumbar vertebrae.  IMPRESSION: Punctate nonobstructing stone in the left kidney.  No obstructing ureteral stones are demonstrated.  The bladder wall is thickened which may suggest cystitis or bladder hypertrophy.  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  Urachal carcinoma or infection should be excluded.  Gas in the lower uterine segment endometrium.  Original Report Authenticated By: Marlon Pel, M.D.   Dg Chest 2 View  06/23/2011  *RADIOLOGY REPORT*  Clinical Data: The left posterior back pain.  Fever and shortness of breath.  CHEST - 2 VIEW  Comparison: Chest x-ray 05/17/2011.  Findings: Lung volumes are normal.  No consolidative airspace disease.  No pleural  effusions.  No pneumothorax.  No pulmonary nodule or mass noted.  Pulmonary vasculature and the cardiomediastinal silhouette are within normal limits.  Surgical clips project over the right upper quadrant of the abdomen, likely from prior cholecystectomy.  IMPRESSION: 1. No radiographic evidence of acute cardiopulmonary disease.  Original Report Authenticated By: Florencia Reasons, M.D.       MDM  Labs. Ct.    Po fluids, no nv. Pt drove self and has to drive home, reports allergy to nsaid and tylenol, will defer pain rx for now.  Recheck abd soft  nt. Pt comfortable, talking on cell phone, no distress.  Discussed radiologist interpretation of ct, and need for urology follow up.  bs elev, on insulin, hco3 normal. No nv. abd soft nt on recheck.   Discussed pcp follow up. Requests pain rx for home.        Suzi Roots, MD 07/18/11 [phone]

## 2011-07-18 ENCOUNTER — Telehealth: Payer: Self-pay

## 2011-07-18 ENCOUNTER — Ambulatory Visit (INDEPENDENT_AMBULATORY_CARE_PROVIDER_SITE_OTHER): Payer: Managed Care, Other (non HMO) | Admitting: Family Medicine

## 2011-07-18 VITALS — BP 139/84 | HR 89 | Temp 98.3°F | Resp 16 | Ht 65.0 in | Wt 175.0 lb

## 2011-07-18 DIAGNOSIS — R109 Unspecified abdominal pain: Secondary | ICD-10-CM

## 2011-07-18 DIAGNOSIS — N200 Calculus of kidney: Secondary | ICD-10-CM

## 2011-07-18 DIAGNOSIS — R110 Nausea: Secondary | ICD-10-CM

## 2011-07-18 DIAGNOSIS — E1165 Type 2 diabetes mellitus with hyperglycemia: Secondary | ICD-10-CM | POA: Insufficient documentation

## 2011-07-18 DIAGNOSIS — R300 Dysuria: Secondary | ICD-10-CM

## 2011-07-18 DIAGNOSIS — E109 Type 1 diabetes mellitus without complications: Secondary | ICD-10-CM

## 2011-07-18 LAB — POCT CBC
Granulocyte percent: 69.2 %G (ref 37–80)
HCT, POC: 47.4 % (ref 37.7–47.9)
Hemoglobin: 14.9 g/dL (ref 12.2–16.2)
Lymph, poc: 3.2 (ref 0.6–3.4)
MCH, POC: 31.6 pg — AB (ref 27–31.2)
MCHC: 31.4 g/dL — AB (ref 31.8–35.4)
MCV: 100.4 fL — AB (ref 80–97)
MID (cbc): 0.6 (ref 0–0.9)
MPV: 8.8 fL (ref 0–99.8)
POC Granulocyte: 8.4 — AB (ref 2–6.9)
POC LYMPH PERCENT: 26 %L (ref 10–50)
POC MID %: 4.8 %M (ref 0–12)
Platelet Count, POC: 543 10*3/uL — AB (ref 142–424)
RBC: 4.72 M/uL (ref 4.04–5.48)
RDW, POC: 12.8 %
WBC: 12.2 10*3/uL — AB (ref 4.6–10.2)

## 2011-07-18 LAB — GLUCOSE, POCT (MANUAL RESULT ENTRY): POC Glucose: 90 mg/dl (ref 70–99)

## 2011-07-18 LAB — POCT URINALYSIS DIPSTICK
Glucose, UA: NEGATIVE
Ketones, UA: NEGATIVE
Leukocytes, UA: NEGATIVE
Nitrite, UA: NEGATIVE
Protein, UA: >=300
Spec Grav, UA: >=1.03
Urobilinogen, UA: 0.2
pH, UA: 5.5

## 2011-07-18 MED ORDER — TAMSULOSIN HCL 0.4 MG PO CAPS: 0.4000 mg | ORAL_CAPSULE | Freq: Every day | ORAL | Status: DC

## 2011-07-18 MED ORDER — ONDANSETRON HCL 4 MG/2ML IJ SOLN
4.0000 mg | Freq: Once | INTRAMUSCULAR | Status: AC
  Administered 2011-07-18: 4 mg via INTRAMUSCULAR

## 2011-07-18 MED ORDER — CIPROFLOXACIN HCL 250 MG PO TABS: 250.0000 mg | ORAL_TABLET | Freq: Two times a day (BID) | ORAL | Status: AC

## 2011-07-18 MED ORDER — OXYCODONE HCL 5 MG PO TABS: 5.0000 mg | ORAL_TABLET | ORAL | Status: DC | PRN

## 2011-07-18 NOTE — Patient Instructions (Signed)

## 2011-07-18 NOTE — Addendum Note (Signed)
Addended by: Marinus Maw on: 07/18/2011 06:30 PM   Modules accepted: Orders

## 2011-07-18 NOTE — Progress Notes (Signed)
41 yo woman with kidney stone pain beginning 4 days ago which worsened 2 days ago and was evaluated at [facility] ED with CT showing the stone.  Since then, the pain decreased and the Zofran helped with the Zofran.  Urologist cannot see for 1 week.  Pain recommenced on left side this morning.  She tried to get a urologist to see her but they said that since she is not a patient there, she would have to see someone else.  Low grade fever last Wednesday.   Nausea was significant as was the pain this morning.  No vomiting.  Dysuria started today.  Color of urine became cloudy with reddish tint today.  Sugar has not been well-controlled. Diabetes x 30 years.  Had h/o left retina laser rx 5 years ago.  Her fibromyalgia was better controlled on the Cymbalta until last couple days.  O: NAD, alert and cooperative HEENT: no abnormalities noted on fundoscopic, TM's, oroph Neck supple no adenop or thyromegaly Chest:  Clear Heart:  Reg with IVI SEM Abdomen:  Soft, ;nontender without HSM No cvat  Results for orders placed in visit on 07/18/11  POCT URINALYSIS DIPSTICK      Component Value Range   Color, UA dark yellow     Clarity, UA       Glucose, UA neg     Bilirubin, UA small     Ketones, UA neg     Spec Grav, UA >=1.030     Blood, UA small     pH, UA 5.5     Protein, UA >=300     Urobilinogen, UA 0.2     Nitrite, UA neg     Leukocytes, UA Negative    GLUCOSE, POCT (MANUAL RESULT ENTRY)      Component Value Range   POC Glucose 90  70 - 99 mg/dl  POCT CBC      Component Value Range   WBC 12.2 (*) 4.6 - 10.2 K/uL   Lymph, poc 3.2  0.6 - 3.4   POC LYMPH PERCENT 26.0  10 - 50 %L   MID (cbc) 0.6  0 - 0.9   POC MID % 4.8  0 - 12 %M   POC Granulocyte 8.4 (*) 2 - 6.9   Granulocyte percent 69.2  37 - 80 %G   RBC 4.72  4.04 - 5.48 M/uL   Hemoglobin 14.9  12.2 - 16.2 g/dL   HCT, POC 16.1  09.6 - 47.9 %   MCV 100.4 (*) 80 - 97 fL   MCH, POC 31.6 (*) 27 - 31.2 pg   MCHC 31.4 (*) 31.8 - 35.4  g/dL   RDW, POC 04.5     Platelet Count, POC 543 (*) 142 - 424 K/uL   MPV 8.8  0 - 99.8 fL  patient vomited in office Observed for 45 minutes, intermittently face to face  Assessment:  Presumed kidney stone, nausea.  Plan:   1. Flank pain  POCT UA - Microscopic Only, POCT urinalysis dipstick, POCT glucose (manual entry), POCT CBC, Urine culture, Ambulatory referral to Urology, Tamsulosin HCl (FLOMAX) 0.4 MG CAPS  2. Dysuria  POCT UA - Microscopic Only, POCT urinalysis dipstick, POCT glucose (manual entry), POCT CBC, Urine culture, Ambulatory referral to Urology, ciprofloxacin (CIPRO) 250 MG tablet  3. Nausea  POCT UA - Microscopic Only, POCT urinalysis dipstick, POCT glucose (manual entry), POCT CBC, Urine culture, Ambulatory referral to Urology  4. Type 2 diabetes mellitus, uncontrolled    5.  Kidney stone on left side  Ambulatory referral to Urology, Tamsulosin HCl (FLOMAX) 0.4 MG CAPS, oxyCODONE (ROXICODONE) 5 MG immediate release tablet  to ER if symptoms worsen

## 2011-07-18 NOTE — Telephone Encounter (Signed)
Pt was seen at ER this past Thursday and was diagnosed with kidney stone and is supposed to go to urologist Friday 07-24-11 for sidoscopy, she is not in a lot of pain but wants to know if we could do a refill for oxycodone she feels that she already knows what going on and doesn't have the money to keep coming to dr office. Please contact.

## 2011-07-24 ENCOUNTER — Other Ambulatory Visit: Payer: Self-pay | Admitting: Urology

## 2011-07-24 ENCOUNTER — Telehealth: Payer: Self-pay

## 2011-07-24 DIAGNOSIS — R109 Unspecified abdominal pain: Secondary | ICD-10-CM

## 2011-07-24 DIAGNOSIS — N200 Calculus of kidney: Secondary | ICD-10-CM

## 2011-07-24 NOTE — Telephone Encounter (Signed)
PT REQUESTING OXY. REFILL   BEST PHONE [phone]  [facility]

## 2011-07-25 NOTE — Telephone Encounter (Signed)
Dr. Milus Glazier, please review.

## 2011-07-26 MED ORDER — OXYCODONE HCL 5 MG PO TABS: ORAL_TABLET | ORAL | Status: DC

## 2011-07-26 NOTE — Telephone Encounter (Signed)
Please call in percocet 5/325 #12 to be taken 1 q6h prn pain.  I would like patient to go to ED for a CT scan today

## 2011-07-26 NOTE — Telephone Encounter (Signed)
CAN YOU WRITE AN RX FOR PATIENT

## 2011-07-26 NOTE — Telephone Encounter (Signed)
I did the prescription for 5mg  oxycodone due to acetaminophen listed as an allergy and it is ready for patient to pick up.

## 2011-07-26 NOTE — Telephone Encounter (Signed)
LMOM THAT RX IS READY FOR PICKUP 

## 2011-07-28 ENCOUNTER — Ambulatory Visit (HOSPITAL_COMMUNITY)
Admission: RE | Admit: 2011-07-28 | Discharge: 2011-07-28 | Disposition: A | Discharge status: Home / Self Care | Payer: Managed Care, Other (non HMO) | Source: Ambulatory Visit | Attending: Urology | Admitting: Urology

## 2011-07-28 DIAGNOSIS — R109 Unspecified abdominal pain: Secondary | ICD-10-CM

## 2011-07-28 DIAGNOSIS — Z90710 Acquired absence of both cervix and uterus: Secondary | ICD-10-CM | POA: Insufficient documentation

## 2011-07-28 MED ORDER — GADOBENATE DIMEGLUMINE 529 MG/ML IV SOLN
17.0000 mL | Freq: Once | INTRAVENOUS | Status: AC | PRN
  Administered 2011-07-28: 17 mL via INTRAVENOUS

## 2011-07-31 ENCOUNTER — Ambulatory Visit (INDEPENDENT_AMBULATORY_CARE_PROVIDER_SITE_OTHER): Payer: Managed Care, Other (non HMO) | Admitting: Family Medicine

## 2011-07-31 ENCOUNTER — Telehealth: Payer: Self-pay

## 2011-07-31 VITALS — BP 158/80 | HR 88 | Temp 99.0°F | Resp 18 | Ht 64.8 in | Wt 175.0 lb

## 2011-07-31 DIAGNOSIS — N200 Calculus of kidney: Secondary | ICD-10-CM

## 2011-07-31 DIAGNOSIS — R1084 Generalized abdominal pain: Secondary | ICD-10-CM

## 2011-07-31 DIAGNOSIS — R110 Nausea: Secondary | ICD-10-CM

## 2011-07-31 MED ORDER — ONDANSETRON 8 MG PO TBDP: 8.0000 mg | ORAL_TABLET | Freq: Three times a day (TID) | ORAL | Status: AC | PRN

## 2011-07-31 MED ORDER — OXYCODONE HCL 5 MG PO TABS: ORAL_TABLET | ORAL | Status: DC

## 2011-07-31 NOTE — Telephone Encounter (Signed)
NEEDS A PRESCRIPTION REFILL,DOESN'T KNOW IF SHE NEEDS TO SEE A PHYSICIAN AGAIN. HAS A QUESTION ABOUT BEING REFERRED TO A UROLOGIST.

## 2011-07-31 NOTE — Progress Notes (Signed)
41 yo with abdominal pain.  She recently saw the urologist who said he did not think the pain was coming from a stone and the bladder cystoscopy showed no abnormality.  The flomax seems to help  Currently, patient is having intermittent pain.  Today, the pain is worse.  She also notes nausea.  The oxycodone does make her sleepy.  Blood sugars are well controlled.  Objective:  NAD, alert Neck: supple Chest:  Clear Heart;  Reg, no murmur Abdomen: soft without masses or HSM, nontender.

## 2011-08-10 ENCOUNTER — Telehealth: Payer: Self-pay

## 2011-08-10 NOTE — Telephone Encounter (Signed)
Pt states that she is currently out of flomax pt would like to know if she should refill this rx or not, pt states that she is feeling a little better but she is still in pain.  [phone]

## 2011-08-11 NOTE — Telephone Encounter (Signed)
She should check with the pharmacy - looks like there are 3 refills on the Flomax written on 07/18/11

## 2011-08-11 NOTE — Telephone Encounter (Signed)
Called pt Maureen [name] to CB. 

## 2011-08-12 ENCOUNTER — Emergency Department (HOSPITAL_COMMUNITY)
Admission: EM | Admit: 2011-08-12 | Discharge: 2011-08-12 | Disposition: A | Discharge status: Home / Self Care | Payer: Managed Care, Other (non HMO) | Attending: Emergency Medicine | Admitting: Emergency Medicine

## 2011-08-12 ENCOUNTER — Encounter (HOSPITAL_COMMUNITY): Payer: Self-pay | Admitting: *Deleted

## 2011-08-12 ENCOUNTER — Emergency Department (HOSPITAL_COMMUNITY): Payer: Managed Care, Other (non HMO)

## 2011-08-12 DIAGNOSIS — IMO0001 Reserved for inherently not codable concepts without codable children: Secondary | ICD-10-CM | POA: Insufficient documentation

## 2011-08-12 DIAGNOSIS — R197 Diarrhea, unspecified: Secondary | ICD-10-CM | POA: Insufficient documentation

## 2011-08-12 DIAGNOSIS — D72829 Elevated white blood cell count, unspecified: Secondary | ICD-10-CM | POA: Insufficient documentation

## 2011-08-12 DIAGNOSIS — Z79899 Other long term (current) drug therapy: Secondary | ICD-10-CM | POA: Insufficient documentation

## 2011-08-12 DIAGNOSIS — Z794 Long term (current) use of insulin: Secondary | ICD-10-CM | POA: Insufficient documentation

## 2011-08-12 DIAGNOSIS — R109 Unspecified abdominal pain: Secondary | ICD-10-CM | POA: Insufficient documentation

## 2011-08-12 DIAGNOSIS — F329 Major depressive disorder, single episode, unspecified: Secondary | ICD-10-CM | POA: Insufficient documentation

## 2011-08-12 DIAGNOSIS — E785 Hyperlipidemia, unspecified: Secondary | ICD-10-CM | POA: Insufficient documentation

## 2011-08-12 DIAGNOSIS — F3289 Other specified depressive episodes: Secondary | ICD-10-CM | POA: Insufficient documentation

## 2011-08-12 DIAGNOSIS — E119 Type 2 diabetes mellitus without complications: Secondary | ICD-10-CM | POA: Insufficient documentation

## 2011-08-12 DIAGNOSIS — R110 Nausea: Secondary | ICD-10-CM | POA: Insufficient documentation

## 2011-08-12 LAB — COMPREHENSIVE METABOLIC PANEL
ALT: 13 U/L (ref 0–35)
AST: 12 U/L (ref 0–37)
Albumin: 4 g/dL (ref 3.5–5.2)
Alkaline Phosphatase: 132 U/L — ABNORMAL HIGH (ref 39–117)
BUN: 13 mg/dL (ref 6–23)
CO2: 25 mEq/L (ref 19–32)
Calcium: 9.8 mg/dL (ref 8.4–10.5)
Chloride: 92 mEq/L — ABNORMAL LOW (ref 96–112)
Creatinine, Ser: 0.89 mg/dL (ref 0.50–1.10)
GFR calc Af Amer: >90 mL/min (ref >90)
GFR calc non Af Amer: 81 mL/min — ABNORMAL LOW (ref >90)
Glucose, Bld: 369 mg/dL — ABNORMAL HIGH (ref 70–99)
Potassium: 3.6 mEq/L (ref 3.5–5.1)
Sodium: 131 mEq/L — ABNORMAL LOW (ref 135–145)
Total Bilirubin: 0.5 mg/dL (ref 0.3–1.2)
Total Protein: 8.3 g/dL (ref 6.0–8.3)

## 2011-08-12 LAB — CBC WITH DIFFERENTIAL/PLATELET
Basophils Absolute: 0 10*3/uL (ref 0.0–0.1)
Basophils Relative: 0 % (ref 0–1)
Eosinophils Absolute: 0.1 10*3/uL (ref 0.0–0.7)
Eosinophils Relative: 0 % (ref 0–5)
HCT: 41.1 % (ref 36.0–46.0)
Hemoglobin: 14.3 g/dL (ref 12.0–15.0)
Lymphocytes Relative: 26 % (ref 12–46)
Lymphs Abs: 4.1 10*3/uL — ABNORMAL HIGH (ref 0.7–4.0)
MCH: 31.6 pg (ref 26.0–34.0)
MCHC: 34.8 g/dL (ref 30.0–36.0)
MCV: 90.7 fL (ref 78.0–100.0)
Monocytes Absolute: 1 10*3/uL (ref 0.1–1.0)
Monocytes Relative: 6 % (ref 3–12)
Neutro Abs: 10.7 10*3/uL — ABNORMAL HIGH (ref 1.7–7.7)
Neutrophils Relative %: 68 % (ref 43–77)
Platelets: 483 10*3/uL — ABNORMAL HIGH (ref 150–400)
RBC: 4.53 MIL/uL (ref 3.87–5.11)
RDW: 12.3 % (ref 11.5–15.5)
WBC: 15.8 10*3/uL — ABNORMAL HIGH (ref 4.0–10.5)

## 2011-08-12 LAB — URINALYSIS, ROUTINE W REFLEX MICROSCOPIC
Bilirubin Urine: NEGATIVE
Glucose, UA: >1000 mg/dL — AB
Hgb urine dipstick: NEGATIVE
Ketones, ur: NEGATIVE mg/dL
Leukocytes, UA: NEGATIVE
Nitrite: NEGATIVE
Protein, ur: 100 mg/dL — AB
Specific Gravity, Urine: 1.036 — ABNORMAL HIGH (ref 1.005–1.030)
Urobilinogen, UA: 0.2 mg/dL (ref 0.0–1.0)
pH: 6 (ref 5.0–8.0)

## 2011-08-12 LAB — GLUCOSE, CAPILLARY
Glucose-Capillary: 51 mg/dL — ABNORMAL LOW (ref 70–99)
Glucose-Capillary: 100 mg/dL — ABNORMAL HIGH (ref 70–99)
Glucose-Capillary: 44 mg/dL — CL (ref 70–99)
Glucose-Capillary: 158 mg/dL — ABNORMAL HIGH (ref 70–99)

## 2011-08-12 LAB — URINE MICROSCOPIC-ADD ON

## 2011-08-12 MED ORDER — SODIUM CHLORIDE 0.9 % IV BOLUS (SEPSIS)
1000.0000 mL | Freq: Once | INTRAVENOUS | Status: AC
  Administered 2011-08-12: 1000 mL via INTRAVENOUS

## 2011-08-12 MED ORDER — INSULIN REGULAR HUMAN 100 UNIT/ML IJ SOLN: 10.0000 [IU] | Freq: Once | INTRAMUSCULAR | Status: DC

## 2011-08-12 MED ORDER — TRAMADOL HCL 50 MG PO TABS
50.0000 mg | ORAL_TABLET | Freq: Four times a day (QID) | ORAL | Status: DC | PRN
Start: 2011-08-12 — End: 2011-08-19

## 2011-08-12 MED ORDER — HYDROMORPHONE HCL PF 1 MG/ML IJ SOLN
1.0000 mg | Freq: Once | INTRAMUSCULAR | Status: AC
Start: 2011-08-12 — End: 2011-08-12
  Administered 2011-08-12: 1 mg via INTRAVENOUS
  Filled 2011-08-12: qty 1

## 2011-08-12 MED ORDER — IOHEXOL 300 MG/ML  SOLN
100.0000 mL | Freq: Once | INTRAMUSCULAR | Status: AC | PRN
  Administered 2011-08-12: 100 mL via INTRAVENOUS

## 2011-08-12 MED ORDER — DICYCLOMINE HCL 10 MG PO CAPS
20.0000 mg | ORAL_CAPSULE | Freq: Once | ORAL | Status: AC
  Administered 2011-08-12: 20 mg via ORAL
  Filled 2011-08-12: qty 2

## 2011-08-12 MED ORDER — DICYCLOMINE HCL 20 MG PO TABS: 20.0000 mg | ORAL_TABLET | Freq: Four times a day (QID) | ORAL | Status: DC | PRN

## 2011-08-12 MED ORDER — INSULIN ASPART 100 UNIT/ML ~~LOC~~ SOLN
10.0000 [IU] | Freq: Once | SUBCUTANEOUS | Status: AC
  Administered 2011-08-12: 10 [IU] via SUBCUTANEOUS
  Filled 2011-08-12 (×2): qty 10

## 2011-08-12 NOTE — ED Provider Notes (Signed)
History     CSN: [id number]  Arrival date & time 08/12/11  4098   First MD Initiated Contact with Patient 08/12/11 0144      Chief Complaint  Patient presents with  . Abdominal Pain    (Consider location/radiation/quality/duration/timing/severity/associated sxs/prior treatment) HPI 41 year old female presents from his part complaining of abdominal pain. Patient reports pain is on her right side. Abdominal pain has been going on for some time worse over the last 3 days. She now has nausea and diarrhea associated with the pain. Patient seen recently for kidney stone, reports she's been seen by urology and told she had scarring of her right kidney. Patient reports she thinks she might have a urinary tract infection, has urgency and frequency and some burning. Patient reports her blood sugars have been over 200, which is unusual for her. She denies any vomiting. She's been taking her medications as prescribed. Patient is concerned that "something is going wrong with my body and no one is fixing it."  Past Medical History  Diagnosis Date  . Diabetes mellitus   . Fibromyalgia   . Arthritis   . Hyperlipidemia   . Chills   . Abdominal pain   . Nausea & vomiting   . Biliary dyskinesia   . Depression   . Migraine   . Joint pain   . Skin rash     due to medications  . Urination, excessive at night   . Post traumatic stress disorder (PTSD)   . Sleep trouble     Past Surgical History  Procedure Date  . Tonsillectomy 2001  . Lipoma removed   . Knee surgery   . Cesarean section 1997, 1999  . Abdominal hysterectomy 2001  . Tumor removal 2000    lower back  . Eye surgery 2006    laser eye surgery left eye  . Cholecystectomy 02/10/2011    Procedure: LAPAROSCOPIC CHOLECYSTECTOMY WITH INTRAOPERATIVE CHOLANGIOGRAM;  Surgeon: Rulon Abide, DO;  Location: [facility] OR;  Service: General;  Laterality: N/A;    Family History  Problem Relation Age of Onset  . Cancer Father     lymphoma    . Nephrolithiasis Father   . Cancer Maternal Grandmother     colon  . Hypertension Mother   . Heart disease Mother   . Diabetes Brother     History  Substance Use Topics  . Smoking status: Never Smoker   . Smokeless tobacco: Never Used  . Alcohol Use: No    OB History    Grav Para Term Preterm Abortions TAB SAB Ect Mult Living                  Review of Systems  Constitutional: Positive for fatigue.  Genitourinary: Positive for dysuria, urgency, frequency, flank pain and difficulty urinating.  Musculoskeletal: Positive for myalgias and arthralgias.    Allergies  Ibuprofen; Tylenol; and Cephalexin  Home Medications   Current Outpatient Rx  Name Route Sig Dispense Refill  . INSULIN ASPART 100 UNIT/ML [place] SOLN Subcutaneous Inject 0-5 Units into the skin 3 (three) times daily before meals.     . INSULIN GLARGINE 100 UNIT/ML [place] SOLN Subcutaneous Inject 24 Units into the skin at bedtime.     . TAMSULOSIN HCL 0.4 MG PO CAPS Oral Take 1 capsule (0.4 mg total) by mouth daily. 30 capsule 3  . DICYCLOMINE HCL 20 MG PO TABS Oral Take 1 tablet (20 mg total) by mouth every 6 (six) hours as needed (for abdominal  pain and cramping). 20 tablet 0    BP 143/79  Pulse 83  Temp 98.5 F (36.9 C)  Resp 20  SpO2 99%  Physical Exam  Constitutional: She is oriented to person, place, and time. She appears well-developed and well-nourished. She appears distressed (uncomfortable appearing).       Obesity noted  HENT:  Head: Normocephalic and atraumatic.  Nose: Nose normal.  Mouth/Throat: Oropharynx is clear and moist. No oropharyngeal exudate.  Neck: Normal range of motion. Neck supple. No JVD present. No tracheal deviation present. No thyromegaly present.  Cardiovascular: Normal rate, regular rhythm, normal heart sounds and intact distal pulses.  Exam reveals no gallop and no friction rub.   No murmur heard. Pulmonary/Chest: Effort normal and breath sounds normal. No stridor. No  respiratory distress. She has no wheezes. She has no rales. She exhibits no tenderness.  Abdominal: Soft. Bowel sounds are normal. She exhibits no distension and no mass. There is tenderness (Diffuse tenderness worse on the right, suprapubic and right CVA area). There is no rebound and no guarding.  Musculoskeletal: Normal range of motion. She exhibits no edema and no tenderness.  Lymphadenopathy:    She has no cervical adenopathy.  Neurological: She is alert and oriented to person, place, and time.  Skin: Skin is warm and dry. No rash noted. No erythema. No pallor.  Psychiatric: Her behavior is normal. Judgment and thought content normal.       Anxious    ED Course  Procedures (including critical care time)  Labs Reviewed  CBC WITH DIFFERENTIAL - Abnormal; Notable for the following:    WBC 15.8 (*)     Platelets 483 (*)     Neutro Abs 10.7 (*)     Lymphs Abs 4.1 (*)     All other components within normal limits  COMPREHENSIVE METABOLIC PANEL - Abnormal; Notable for the following:    Sodium 131 (*)     Chloride 92 (*)     Glucose, Bld 369 (*)     Alkaline Phosphatase 132 (*)     GFR calc non Af Amer 81 (*)     All other components within normal limits  URINALYSIS, ROUTINE W REFLEX MICROSCOPIC - Abnormal; Notable for the following:    Specific Gravity, Urine 1.036 (*)     Glucose, UA >1000 (*)     Protein, ur 100 (*)     All other components within normal limits  URINE MICROSCOPIC-ADD ON - Abnormal; Notable for the following:    Squamous Epithelial / LPF FEW (*)     All other components within normal limits   Ct Abdomen Pelvis W Contrast  08/12/2011  *RADIOLOGY REPORT*  Clinical Data: Nausea, abdominal pain, and diarrhea.  Elevated white count.  CT ABDOMEN AND PELVIS WITH CONTRAST  Technique:  Multidetector CT imaging of the abdomen and pelvis was performed following the standard protocol during bolus administration of intravenous contrast.  Contrast: OMNIPAQUE IOHEXOL 300  MG/ML  SOLN  Comparison: MRI pelvis 07/28/2011.  CT abdomen 07/17/2011.  Findings: Clear lung bases.  No focal hepatic lesions.  No biliary ductal dilatation.  Prior cholecystectomy.  Normal spleen, adrenal glands, and pancreas.  No renal masses or obstruction.  Small calculus noted previously not clearly identified.  Nonaneurysmal atherosclerotic calcification of the aorta.  No retroperitoneal adenopathy.  No bowel obstruction, free fluid, or free air. Stomach, small bowel and large bowel show no inflammatory process or mass.  No appendiceal inflammation.  Postoperative scarring anterior  abdominal wall continuous with the bladder redemonstrated and stable.  Slightly greater than 3 cm left ovarian cyst, slightly increased from priors.  Normal alignment of the lumbar spine.  No evidence for diverticulitis.  Air in the lower uterine segment persists with slight pelvic stranding similar to priors, status post hysterectomy.  IMPRESSION: No acute abdominal or pelvic findings.  No bowel obstruction, free fluid or free air. Slight increase size left ovarian cyst. Anterior abdominal wall scarring stable compared with prior CT and MR.  Original Report Authenticated By: Elsie Stain, M.D.     1. Abdominal pain       MDM  41 year old female with history of diabetes and ongoing abdominal pain with recent passed kidney stone. Ration with slight leukocytosis, reports she has history of same. She has had some worsening diarrhea and abdominal pain. Some concern for colitis given her symptoms and tenderness on exam. CT scan unremarkable. We'll start Bentyl for abdominal cramping and pain. Patient has good followup with local primary care doctor.  Pt was updated on findings and plan.        Olivia Mackie, MD 08/12/11 [id number]

## 2011-08-12 NOTE — ED Notes (Signed)
CBG 51. MD [facility] notified. Advised to give pt. OJ and crackers with peanut butter. -- Will check CBG again at 09:45.

## 2011-08-12 NOTE — ED Notes (Signed)
Pt diagnosed a few wks ago with kidney stones; three days ago started with abd pain; worse since then; nausea; diarrhea

## 2011-08-12 NOTE — ED Provider Notes (Signed)
Patient turned over to me by Dr. Ruthann Cancer her.  She was initially hyperglycemic given a dose of insulin.  Her blood sugar decreased to 50s and then the 40s.  Patient was able to maintain by mouth intake and has now been monitored for several hours after that.  She has had 2 sugars that are now 100 and in the 150s.  Patient feels well and is tolerating by mouth and I believe is safe for discharge home.  Nat Christen, MD 08/12/11 1146

## 2011-08-12 NOTE — ED Notes (Signed)
Advised RN French Ana of situation

## 2011-08-13 NOTE — Telephone Encounter (Signed)
FYI: Lauenstein poke with pt in regards to her phone message from early this week.  She states that she ended up having to go to [facility] ER, because her pain just kept getting worse.  They did CT A/P. She states report was normal.  They diagnosed her with possible Irritable Bowel, she will be coming in office Friday to see Lauenstein to follow up because she is still having nausea and vomiting sxs.

## 2011-08-13 NOTE — Telephone Encounter (Signed)
Called, no answer. Try again later.

## 2011-08-14 ENCOUNTER — Ambulatory Visit (INDEPENDENT_AMBULATORY_CARE_PROVIDER_SITE_OTHER): Payer: Managed Care, Other (non HMO) | Admitting: Family Medicine

## 2011-08-14 VITALS — BP 118/60 | HR 78 | Temp 98.5°F | Resp 16 | Ht 64.25 in | Wt 177.6 lb

## 2011-08-14 DIAGNOSIS — R109 Unspecified abdominal pain: Secondary | ICD-10-CM

## 2011-08-14 LAB — POCT URINALYSIS DIPSTICK
Bilirubin, UA: NEGATIVE
Glucose, UA: NEGATIVE
Ketones, UA: NEGATIVE
Leukocytes, UA: NEGATIVE
Nitrite, UA: NEGATIVE
Protein, UA: 100
Spec Grav, UA: >=1.03
Urobilinogen, UA: 0.2
pH, UA: 5

## 2011-08-14 LAB — IRON: Iron: 37 ug/dL — ABNORMAL LOW (ref 42–145)

## 2011-08-14 LAB — POCT CBC
Granulocyte percent: 60.3 %G (ref 37–80)
HCT, POC: 43.1 % (ref 37.7–47.9)
Hemoglobin: 13.5 g/dL (ref 12.2–16.2)
Lymph, poc: 3.9 — AB (ref 0.6–3.4)
MCH, POC: 31.3 pg — AB (ref 27–31.2)
MCHC: 31.3 g/dL — AB (ref 31.8–35.4)
MCV: 99.9 fL — AB (ref 80–97)
MID (cbc): 0.9 (ref 0–0.9)
MPV: 9.1 fL (ref 0–99.8)
POC Granulocyte: 7.3 — AB (ref 2–6.9)
POC LYMPH PERCENT: 32.4 %L (ref 10–50)
POC MID %: 7.3 %M (ref 0–12)
Platelet Count, POC: 484 10*3/uL — AB (ref 142–424)
RBC: 4.31 M/uL (ref 4.04–5.48)
RDW, POC: 12.9 %
WBC: 12.1 10*3/uL — AB (ref 4.6–10.2)

## 2011-08-14 LAB — POCT UA - MICROSCOPIC ONLY
Casts, Ur, LPF, POC: NEGATIVE
Crystals, Ur, HPF, POC: NEGATIVE
Mucus, UA: POSITIVE
Yeast, UA: NEGATIVE

## 2011-08-14 LAB — COMPREHENSIVE METABOLIC PANEL
ALT: 12 U/L (ref 0–35)
AST: 11 U/L (ref 0–37)
Albumin: 3.8 g/dL (ref 3.5–5.2)
Alkaline Phosphatase: 102 U/L (ref 39–117)
BUN: 11 mg/dL (ref 6–23)
CO2: 29 mEq/L (ref 19–32)
Calcium: 9.3 mg/dL (ref 8.4–10.5)
Chloride: 101 mEq/L (ref 96–112)
Creat: 0.78 mg/dL (ref 0.50–1.10)
Glucose, Bld: 97 mg/dL (ref 70–99)
Potassium: 4.4 mEq/L (ref 3.5–5.3)
Sodium: 138 mEq/L (ref 135–145)
Total Bilirubin: 0.4 mg/dL (ref 0.3–1.2)
Total Protein: 6.6 g/dL (ref 6.0–8.3)

## 2011-08-14 LAB — POCT SEDIMENTATION RATE: POCT SED RATE: 45 mm/hr — AB (ref 0–22)

## 2011-08-14 LAB — GLUCOSE, POCT (MANUAL RESULT ENTRY): POC Glucose: 94 mg/dl (ref 70–99)

## 2011-08-14 LAB — LIPASE: Lipase: 41 U/L (ref 0–75)

## 2011-08-14 MED ORDER — PROMETHAZINE HCL 25 MG PO TABS: 25.0000 mg | ORAL_TABLET | Freq: Three times a day (TID) | ORAL | Status: DC | PRN

## 2011-08-14 NOTE — Progress Notes (Signed)
41 yo with abdominal pain, nausea, vomiting intermittently for 2 weeks.  Evaluated last Sunday at the ED with CT scan negative.  Nausea, vomiting and diarrhea have persisted all week along with intermittent abdominal pain.  Blood sugars have fluctuated markedly.  She has been worked up with colonoscopy, lupus work-up in [place].  No unusual vaginal bleeding.  She has had episodes like this in the past.  She has had elevated WBC in past as well with negative bone marrow biopsy.  Itchy papular shoulder rash, mild chest pain No shortness of breath, cough, dysuria, blurry vision,   Objective:  NAD, alert and a good historian Chest: clear Heart:  Reg, no murmur Abdomen: tender left side, no mass, no guarding, no rebound, no HSM  Ext:  No edema, good pulses Skin:  Excoriated shoulders  Results for orders placed during the hospital encounter of 08/12/11  CBC WITH DIFFERENTIAL      Component Value Range   WBC 15.8 (*) 4.0 - 10.5 K/uL   RBC 4.53  3.87 - 5.11 MIL/uL   Hemoglobin 14.3  12.0 - 15.0 g/dL   HCT 16.1  09.6 - 04.5 %   MCV 90.7  78.0 - 100.0 fL   MCH 31.6  26.0 - 34.0 pg   MCHC 34.8  30.0 - 36.0 g/dL   RDW 40.9  81.1 - 91.4 %   Platelets 483 (*) 150 - 400 K/uL   Neutrophils Relative 68  43 - 77 %   Neutro Abs 10.7 (*) 1.7 - 7.7 K/uL   Lymphocytes Relative 26  12 - 46 %   Lymphs Abs 4.1 (*) 0.7 - 4.0 K/uL   Monocytes Relative 6  3 - 12 %   Monocytes Absolute 1.0  0.1 - 1.0 K/uL   Eosinophils Relative 0  0 - 5 %   Eosinophils Absolute 0.1  0.0 - 0.7 K/uL   Basophils Relative 0  0 - 1 %   Basophils Absolute 0.0  0.0 - 0.1 K/uL  COMPREHENSIVE METABOLIC PANEL      Component Value Range   Sodium 131 (*) 135 - 145 mEq/L   Potassium 3.6  3.5 - 5.1 mEq/L   Chloride 92 (*) 96 - 112 mEq/L   CO2 25  19 - 32 mEq/L   Glucose, Bld 369 (*) 70 - 99 mg/dL   BUN 13  6 - 23 mg/dL   Creatinine, Ser 7.82  0.50 - 1.10 mg/dL   Calcium 9.8  8.4 - 95.6 mg/dL   Total Protein 8.3  6.0 - 8.3  g/dL   Albumin 4.0  3.5 - 5.2 g/dL   AST 12  0 - 37 U/L   ALT 13  0 - 35 U/L   Alkaline Phosphatase 132 (*) 39 - 117 U/L   Total Bilirubin 0.5  0.3 - 1.2 mg/dL   GFR calc non Af Amer 81 (*) >90 mL/min   GFR calc Af Amer >90  >90 mL/min  URINALYSIS, ROUTINE W REFLEX MICROSCOPIC      Component Value Range   Color, Urine YELLOW  YELLOW   APPearance CLEAR  CLEAR   Specific Gravity, Urine 1.036 (*) 1.005 - 1.030   pH 6.0  5.0 - 8.0   Glucose, UA >1000 (*) NEGATIVE mg/dL   Hgb urine dipstick NEGATIVE  NEGATIVE   Bilirubin Urine NEGATIVE  NEGATIVE   Ketones, ur NEGATIVE  NEGATIVE mg/dL   Protein, ur 213 (*) NEGATIVE mg/dL   Urobilinogen, UA 0.2  0.0 -  1.0 mg/dL   Nitrite NEGATIVE  NEGATIVE   Leukocytes, UA NEGATIVE  NEGATIVE  URINE MICROSCOPIC-ADD ON      Component Value Range   Squamous Epithelial / LPF FEW (*) RARE  GLUCOSE, CAPILLARY      Component Value Range   Glucose-Capillary 51 (*) 70 - 99 mg/dL   Comment 1 Notify RN    GLUCOSE, CAPILLARY      Component Value Range   Glucose-Capillary 44 (*) 70 - 99 mg/dL  GLUCOSE, CAPILLARY      Component Value Range   Glucose-Capillary 100 (*) 70 - 99 mg/dL  GLUCOSE, CAPILLARY      Component Value Range   Glucose-Capillary 158 (*) 70 - 99 mg/dL    Results for orders placed in visit on 08/14/11  POCT CBC      Component Value Range   WBC 12.1 (*) 4.6 - 10.2 K/uL   Lymph, poc 3.9 (*) 0.6 - 3.4   POC LYMPH PERCENT 32.4  10 - 50 %L   MID (cbc) 0.9  0 - 0.9   POC MID % 7.3  0 - 12 %M   POC Granulocyte 7.3 (*) 2 - 6.9   Granulocyte percent 60.3  37 - 80 %G   RBC 4.31  4.04 - 5.48 M/uL   Hemoglobin 13.5  12.2 - 16.2 g/dL   HCT, POC 16.1  09.6 - 47.9 %   MCV 99.9 (*) 80 - 97 fL   MCH, POC 31.3 (*) 27 - 31.2 pg   MCHC 31.3 (*) 31.8 - 35.4 g/dL   RDW, POC 04.5     Platelet Count, POC 484 (*) 142 - 424 K/uL   MPV 9.1  0 - 99.8 fL  POCT UA - MICROSCOPIC ONLY      Component Value Range   WBC, Ur, HPF, POC 0-1     RBC, urine,  microscopic rare     Bacteria, U Microscopic 1+     Mucus, UA positive     Epithelial cells, urine per micros 3-6     Crystals, Ur, HPF, POC neg     Casts, Ur, LPF, POC neg     Yeast, UA neg    POCT URINALYSIS DIPSTICK      Component Value Range   Color, UA yellow     Clarity, UA sl cloudy     Glucose, UA neg     Bilirubin, UA neg     Ketones, UA neg     Spec Grav, UA >=1.030     Blood, UA trace lysed     pH, UA 5.0     Protein, UA 100     Urobilinogen, UA 0.2     Nitrite, UA neg     Leukocytes, UA Negative    GLUCOSE, POCT (MANUAL RESULT ENTRY)      Component Value Range   POC Glucose 94  70 - 99 mg/dl    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  I am concerned about a hereditary  Problem like porphyria, familial mediterranean fever  P: 1. Abdominal pain  POCT CBC, POCT UA - Microscopic Only, POCT urinalysis dipstick, POCT glucose (manual entry), Comprehensive metabolic panel, Lipase, Celiac panel, Iron, POCT SEDIMENTATION RATE   Trial of Benadryl tid to see if this could be histamine induced problem

## 2011-08-14 NOTE — Addendum Note (Signed)
Addended by: Elvina Sidle on: 08/14/2011 08:57 AM   Modules accepted: Orders

## 2011-08-19 ENCOUNTER — Encounter (HOSPITAL_COMMUNITY): Payer: Self-pay | Admitting: Emergency Medicine

## 2011-08-19 ENCOUNTER — Emergency Department (HOSPITAL_COMMUNITY)
Admission: EM | Admit: 2011-08-19 | Discharge: 2011-08-19 | Disposition: A | Discharge status: Home / Self Care | Payer: Managed Care, Other (non HMO) | Attending: Emergency Medicine | Admitting: Emergency Medicine

## 2011-08-19 DIAGNOSIS — R109 Unspecified abdominal pain: Secondary | ICD-10-CM

## 2011-08-19 DIAGNOSIS — M129 Arthropathy, unspecified: Secondary | ICD-10-CM | POA: Insufficient documentation

## 2011-08-19 DIAGNOSIS — D72829 Elevated white blood cell count, unspecified: Secondary | ICD-10-CM | POA: Insufficient documentation

## 2011-08-19 DIAGNOSIS — F329 Major depressive disorder, single episode, unspecified: Secondary | ICD-10-CM | POA: Insufficient documentation

## 2011-08-19 DIAGNOSIS — R1013 Epigastric pain: Secondary | ICD-10-CM | POA: Insufficient documentation

## 2011-08-19 DIAGNOSIS — F4310 Post-traumatic stress disorder, unspecified: Secondary | ICD-10-CM | POA: Insufficient documentation

## 2011-08-19 DIAGNOSIS — E119 Type 2 diabetes mellitus without complications: Secondary | ICD-10-CM | POA: Insufficient documentation

## 2011-08-19 DIAGNOSIS — Z794 Long term (current) use of insulin: Secondary | ICD-10-CM | POA: Insufficient documentation

## 2011-08-19 DIAGNOSIS — IMO0001 Reserved for inherently not codable concepts without codable children: Secondary | ICD-10-CM | POA: Insufficient documentation

## 2011-08-19 DIAGNOSIS — F3289 Other specified depressive episodes: Secondary | ICD-10-CM | POA: Insufficient documentation

## 2011-08-19 DIAGNOSIS — E785 Hyperlipidemia, unspecified: Secondary | ICD-10-CM | POA: Insufficient documentation

## 2011-08-19 LAB — CBC WITH DIFFERENTIAL/PLATELET
Basophils Absolute: 0 10*3/uL (ref 0.0–0.1)
Basophils Relative: 0 % (ref 0–1)
Eosinophils Absolute: 0.2 10*3/uL (ref 0.0–0.7)
Eosinophils Relative: 2 % (ref 0–5)
HCT: 45.1 % (ref 36.0–46.0)
Hemoglobin: 16 g/dL — ABNORMAL HIGH (ref 12.0–15.0)
Lymphocytes Relative: 21 % (ref 12–46)
Lymphs Abs: 2.8 10*3/uL (ref 0.7–4.0)
MCH: 33.4 pg (ref 26.0–34.0)
MCHC: 35.5 g/dL (ref 30.0–36.0)
MCV: 94.2 fL (ref 78.0–100.0)
Monocytes Absolute: 0.7 10*3/uL (ref 0.1–1.0)
Monocytes Relative: 5 % (ref 3–12)
Neutro Abs: 9.7 10*3/uL — ABNORMAL HIGH (ref 1.7–7.7)
Neutrophils Relative %: 72 % (ref 43–77)
Platelets: 382 10*3/uL (ref 150–400)
RBC: 4.79 MIL/uL (ref 3.87–5.11)
RDW: 12.7 % (ref 11.5–15.5)
WBC: 13.5 10*3/uL — ABNORMAL HIGH (ref 4.0–10.5)

## 2011-08-19 LAB — COMPREHENSIVE METABOLIC PANEL
ALT: 10 U/L (ref 0–35)
AST: 14 U/L (ref 0–37)
Albumin: 3.7 g/dL (ref 3.5–5.2)
Alkaline Phosphatase: 116 U/L (ref 39–117)
BUN: 7 mg/dL (ref 6–23)
CO2: 27 mEq/L (ref 19–32)
Calcium: 9.7 mg/dL (ref 8.4–10.5)
Chloride: 99 mEq/L (ref 96–112)
Creatinine, Ser: 0.73 mg/dL (ref 0.50–1.10)
GFR calc Af Amer: >90 mL/min (ref >90)
GFR calc non Af Amer: >90 mL/min (ref >90)
Glucose, Bld: 201 mg/dL — ABNORMAL HIGH (ref 70–99)
Potassium: 3.9 mEq/L (ref 3.5–5.1)
Sodium: 138 mEq/L (ref 135–145)
Total Bilirubin: 0.5 mg/dL (ref 0.3–1.2)
Total Protein: 8.4 g/dL — ABNORMAL HIGH (ref 6.0–8.3)

## 2011-08-19 LAB — URINE MICROSCOPIC-ADD ON

## 2011-08-19 LAB — URINALYSIS, ROUTINE W REFLEX MICROSCOPIC
Bilirubin Urine: NEGATIVE
Glucose, UA: >1000 mg/dL — AB
Hgb urine dipstick: NEGATIVE
Ketones, ur: NEGATIVE mg/dL
Leukocytes, UA: NEGATIVE
Nitrite: NEGATIVE
Protein, ur: NEGATIVE mg/dL
Specific Gravity, Urine: 1.037 — ABNORMAL HIGH (ref 1.005–1.030)
Urobilinogen, UA: 0.2 mg/dL (ref 0.0–1.0)
pH: 5.5 (ref 5.0–8.0)

## 2011-08-19 LAB — LIPASE, BLOOD: Lipase: 19 U/L (ref 11–59)

## 2011-08-19 MED ORDER — METOCLOPRAMIDE HCL 10 MG PO TABS
10.0000 mg | ORAL_TABLET | Freq: Once | ORAL | Status: AC
  Administered 2011-08-19: 10 mg via ORAL
  Filled 2011-08-19: qty 1

## 2011-08-19 MED ORDER — METOCLOPRAMIDE HCL 10 MG PO TABS: 10.0000 mg | ORAL_TABLET | Freq: Four times a day (QID) | ORAL | Status: DC

## 2011-08-19 NOTE — ED Notes (Signed)
Pt reports ongoing abdominal pain with nausea. Pt reports seen at [facility] for same last week. Pt reports pain got worse yesterday.

## 2011-08-19 NOTE — ED Notes (Signed)
Pt d/c home in NAD. Pt voiced understanding of d/c instructions and follow up care. Pt ambulated with quick steady gait,

## 2011-08-20 ENCOUNTER — Other Ambulatory Visit: Payer: Self-pay | Admitting: Family Medicine

## 2011-08-20 ENCOUNTER — Telehealth: Payer: Self-pay

## 2011-08-20 DIAGNOSIS — R109 Unspecified abdominal pain: Secondary | ICD-10-CM

## 2011-08-20 LAB — GLIA (IGA/G) + TTG IGA
Gliadin IgA: 9.4 U/mL (ref <20)
Gliadin IgG: 4.3 U/mL (ref <20)
Tissue Transglutaminase Ab, IgA: 7.5 U/mL (ref <20)

## 2011-08-20 NOTE — Telephone Encounter (Signed)
Pt states that she still has the abdominal pain even when her blood sugar is under 100. Pt wants to know if you still think it's her diabetes. Pt feels frustrated because no one knows why she's having the abd pain and diarrhea. Her BS today is 133 and she is in a lot of pain today. States she doesn't want any pain meds. Please advise

## 2011-08-20 NOTE — ED Provider Notes (Signed)
History     CSN: [id number]  Arrival date & time 08/19/11  7829   First MD Initiated Contact with Patient 08/19/11 [phone]      Chief Complaint  Patient presents with  . Abdominal Pain    (Consider location/radiation/quality/duration/timing/severity/associated sxs/prior treatment) HPI Comments: Maureen [name] presents with a 2 week history of near constant right sided and epigastric abdominal pain and nausea. Pain is aching with intermittent escalations of sharp pain which worsens her nausea.  She was seen here 1 week ago at which time her lab tests were unremarkable except for leukocytosis,  Which she states is chronic.  A Ct scan that day was positive for a left lowr ovarian cyst and anterior abdominal wall scarring.  Her surgical history is significant for cholecystectomy and abdominal hysterectomy.  She was prescribed bentyl at her last visit which she states made her pain worse.  She denies fevers and chills and reports has a reduced appetite, but no weight loss.  She has not noticed a pattern suggesting eating makes her pain worse.   She does use phenergan prn nausea. She was also started on benadryl at her last pcp visit to rule out a histamine type reaction as the source of symptoms which she states does not seem to have helped.  The history is provided by the patient.    Past Medical History  Diagnosis Date  . Diabetes mellitus   . Fibromyalgia   . Arthritis   . Hyperlipidemia   . Chills   . Abdominal pain   . Nausea & vomiting   . Biliary dyskinesia   . Depression   . Migraine   . Joint pain   . Skin rash     due to medications  . Post traumatic stress disorder (PTSD)   . Sleep trouble     Past Surgical History  Procedure Date  . Tonsillectomy 2001  . Lipoma removed   . Knee surgery   . Cesarean section 1997, 1999  . Abdominal hysterectomy 2001  . Tumor removal 2000    lower back  . Eye surgery 2006    laser eye surgery left eye  . Cholecystectomy  02/10/2011    Procedure: LAPAROSCOPIC CHOLECYSTECTOMY WITH INTRAOPERATIVE CHOLANGIOGRAM;  Surgeon: Rulon Abide, DO;  Location: [facility] OR;  Service: General;  Laterality: N/A;    Family History  Problem Relation Age of Onset  . Cancer Father     lymphoma  . Nephrolithiasis Father   . Cancer Maternal Grandmother     colon  . Hypertension Mother   . Heart disease Mother   . Diabetes Brother     History  Substance Use Topics  . Smoking status: Never Smoker   . Smokeless tobacco: Never Used  . Alcohol Use: No    OB History    Grav Para Term Preterm Abortions TAB SAB Ect Mult Living                  Review of Systems  Constitutional: Negative for fever and diaphoresis.  HENT: Negative for congestion, sore throat and neck pain.   Eyes: Negative.   Respiratory: Negative for chest tightness and shortness of breath.   Cardiovascular: Negative for chest pain.  Gastrointestinal: Positive for nausea and abdominal pain. Negative for vomiting and abdominal distention.  Genitourinary: Negative.   Musculoskeletal: Negative for joint swelling and arthralgias.  Skin: Negative.  Negative for rash and wound.  Neurological: Negative for dizziness, weakness, light-headedness, numbness and headaches.  Hematological: Negative.   Psychiatric/Behavioral: Negative.     Allergies  Ibuprofen; Tylenol; and Cephalexin  Home Medications   Current Outpatient Rx  Name Route Sig Dispense Refill  . INSULIN ASPART 100 UNIT/ML [place] SOLN Subcutaneous Inject 0-8 Units into the skin 3 (three) times daily before meals. Sliding scale per MD    . INSULIN GLARGINE 100 UNIT/ML [place] SOLN Subcutaneous Inject 28 Units into the skin at bedtime.     Marland Kitchen METOCLOPRAMIDE HCL 10 MG PO TABS Oral Take 1 tablet (10 mg total) by mouth every 6 (six) hours. 30 tablet 0    BP 112/72  Pulse 82  Temp 98.8 F (37.1 C) (Oral)  Resp 20  SpO2 98%  Physical Exam  Nursing note and vitals reviewed. Constitutional: She appears  well-developed and well-nourished.       obese  HENT:  Head: Normocephalic and atraumatic.  Eyes: Conjunctivae are normal.  Neck: Normal range of motion.  Cardiovascular: Normal rate, regular rhythm, normal heart sounds and intact distal pulses.   Pulmonary/Chest: Effort normal and breath sounds normal. She has no wheezes.  Abdominal: Soft. Bowel sounds are normal. She exhibits no distension and no mass. There is tenderness. There is no rebound and no guarding.       Diffuse tenderness epigastric,  Right upper quadrant and periumbilical without focal tenderness.  Musculoskeletal: Normal range of motion.  Neurological: She is alert.  Skin: Skin is warm and dry.  Psychiatric: She has a normal mood and affect.    ED Course  Procedures (including critical care time)  Labs Reviewed  COMPREHENSIVE METABOLIC PANEL - Abnormal; Notable for the following:    Glucose, Bld 201 (*)     Total Protein 8.4 (*)     All other components within normal limits  URINALYSIS, ROUTINE W REFLEX MICROSCOPIC - Abnormal; Notable for the following:    APPearance CLOUDY (*)     Specific Gravity, Urine 1.037 (*)     Glucose, UA >1000 (*)     All other components within normal limits  CBC WITH DIFFERENTIAL - Abnormal; Notable for the following:    WBC 13.5 (*)     Hemoglobin 16.0 (*)     Neutro Abs 9.7 (*)     All other components within normal limits  URINE MICROSCOPIC-ADD ON - Abnormal; Notable for the following:    Squamous Epithelial / LPF MANY (*)     Bacteria, UA FEW (*)     All other components within normal limits  LIPASE, BLOOD  LAB REPORT - SCANNED   No results found.   1. Abdominal pain      MDM  Chronic abdominal pain with no significant changes in labs,  Leukocytosis which is less than previous visit,  Pt reports chronic.  ?stress/pain reaction.  Recent CT scan reviewed.  No exam or lab findings suggesting need for further imaging.  Pt reports bentyl worsened pain ,  She was given dose  of reglan here with modest improvement.  Possible gastroparesis , will prescribe reglan to use in place of her phenergan.  Pt to f/u with her pcp for a recheck within 1 week.     Burgess Amor, PA 08/20/11 1328

## 2011-08-20 NOTE — Telephone Encounter (Signed)
Clearly this is frustrating for everyone.  Often, diabetes can cause problems when it has not been controlled for awhile and it takes time for the symptoms to go away after the sugars have been so chaotic.  I will refer patient back to gastroenterology to see if they have any other suggestions.

## 2011-08-21 NOTE — ED Provider Notes (Signed)
Medical screening examination/treatment/procedure(s) were performed by non-physician practitioner and as supervising physician I was immediately available for consultation/collaboration.   Carleene Cooper III, MD 08/21/11 [phone]

## 2011-09-19 ENCOUNTER — Encounter (HOSPITAL_COMMUNITY): Payer: Self-pay | Admitting: *Deleted

## 2011-09-19 DIAGNOSIS — R070 Pain in throat: Secondary | ICD-10-CM | POA: Insufficient documentation

## 2011-09-19 DIAGNOSIS — R51 Headache: Secondary | ICD-10-CM | POA: Insufficient documentation

## 2011-09-19 LAB — COMPREHENSIVE METABOLIC PANEL
ALT: 45 U/L — ABNORMAL HIGH (ref 0–35)
AST: 62 U/L — ABNORMAL HIGH (ref 0–37)
Albumin: 3.3 g/dL — ABNORMAL LOW (ref 3.5–5.2)
Alkaline Phosphatase: 121 U/L — ABNORMAL HIGH (ref 39–117)
BUN: 8 mg/dL (ref 6–23)
CO2: 24 mEq/L (ref 19–32)
Calcium: 9.5 mg/dL (ref 8.4–10.5)
Chloride: 100 mEq/L (ref 96–112)
Creatinine, Ser: 0.73 mg/dL (ref 0.50–1.10)
GFR calc Af Amer: >90 mL/min (ref >90)
GFR calc non Af Amer: >90 mL/min (ref >90)
Glucose, Bld: 170 mg/dL — ABNORMAL HIGH (ref 70–99)
Potassium: 4.4 mEq/L (ref 3.5–5.1)
Sodium: 136 mEq/L (ref 135–145)
Total Bilirubin: 0.3 mg/dL (ref 0.3–1.2)
Total Protein: 7.5 g/dL (ref 6.0–8.3)

## 2011-09-19 LAB — CBC WITH DIFFERENTIAL/PLATELET
Basophils Absolute: 0 10*3/uL (ref 0.0–0.1)
Basophils Relative: 0 % (ref 0–1)
Eosinophils Absolute: 0.3 10*3/uL (ref 0.0–0.7)
Eosinophils Relative: 2 % (ref 0–5)
HCT: 43.1 % (ref 36.0–46.0)
Hemoglobin: 15.5 g/dL — ABNORMAL HIGH (ref 12.0–15.0)
Lymphocytes Relative: 23 % (ref 12–46)
Lymphs Abs: 2.9 10*3/uL (ref 0.7–4.0)
MCH: 34.1 pg — ABNORMAL HIGH (ref 26.0–34.0)
MCHC: 36 g/dL (ref 30.0–36.0)
MCV: 94.9 fL (ref 78.0–100.0)
Monocytes Absolute: 0.7 10*3/uL (ref 0.1–1.0)
Monocytes Relative: 6 % (ref 3–12)
Neutro Abs: 8.8 10*3/uL — ABNORMAL HIGH (ref 1.7–7.7)
Neutrophils Relative %: 69 % (ref 43–77)
Platelets: 335 10*3/uL (ref 150–400)
RBC: 4.54 MIL/uL (ref 3.87–5.11)
RDW: 13 % (ref 11.5–15.5)
WBC: 12.7 10*3/uL — ABNORMAL HIGH (ref 4.0–10.5)

## 2011-09-19 LAB — URINE MICROSCOPIC-ADD ON

## 2011-09-19 LAB — URINALYSIS, ROUTINE W REFLEX MICROSCOPIC
Bilirubin Urine: NEGATIVE
Glucose, UA: >1000 mg/dL — AB
Ketones, ur: NEGATIVE mg/dL
Leukocytes, UA: NEGATIVE
Nitrite: NEGATIVE
Protein, ur: 30 mg/dL — AB
Specific Gravity, Urine: 1.028 (ref 1.005–1.030)
Urobilinogen, UA: 0.2 mg/dL (ref 0.0–1.0)
pH: 5 (ref 5.0–8.0)

## 2011-09-19 LAB — RAPID STREP SCREEN (MED CTR MEBANE ONLY): Streptococcus, Group A Screen (Direct): NEGATIVE

## 2011-09-19 LAB — GLUCOSE, CAPILLARY: Glucose-Capillary: 178 mg/dL — ABNORMAL HIGH (ref 70–99)

## 2011-09-19 NOTE — ED Notes (Signed)
The pt has had a headache with a sorethroat an elevated temp and her bld sugar has been elevated for 2 days

## 2011-09-20 ENCOUNTER — Emergency Department (HOSPITAL_COMMUNITY)
Admission: EM | Admit: 2011-09-20 | Discharge: 2011-09-20 | Discharge status: Against Medical Advice | Payer: Managed Care, Other (non HMO) | Attending: Emergency Medicine | Admitting: Emergency Medicine

## 2011-09-20 ENCOUNTER — Encounter (HOSPITAL_COMMUNITY): Payer: Self-pay | Admitting: *Deleted

## 2011-09-20 ENCOUNTER — Emergency Department (HOSPITAL_COMMUNITY)
Admission: EM | Admit: 2011-09-20 | Discharge: 2011-09-20 | Disposition: A | Discharge status: Home / Self Care | Payer: Managed Care, Other (non HMO) | Source: Home / Self Care | Attending: Family Medicine | Admitting: Family Medicine

## 2011-09-20 DIAGNOSIS — J029 Acute pharyngitis, unspecified: Secondary | ICD-10-CM

## 2011-09-20 DIAGNOSIS — J069 Acute upper respiratory infection, unspecified: Secondary | ICD-10-CM

## 2011-09-20 HISTORY — DX: Strain of muscle, fascia and tendon at neck level, initial encounter: S16.1XXA

## 2011-09-20 HISTORY — DX: Major depressive disorder, single episode, unspecified: F32.9

## 2011-09-20 IMAGING — CR DG CHEST 2V
2 series · 2 of 2 positions shown · non-contrast
Comparison: CT and radiographs 01/04/2008. Cervical radiographs
08/28/2006.

CLINICAL DATA: Right chest pain with cough and shortness of breath.

CHEST - 2 VIEW

[view not recorded (1 of 2)]
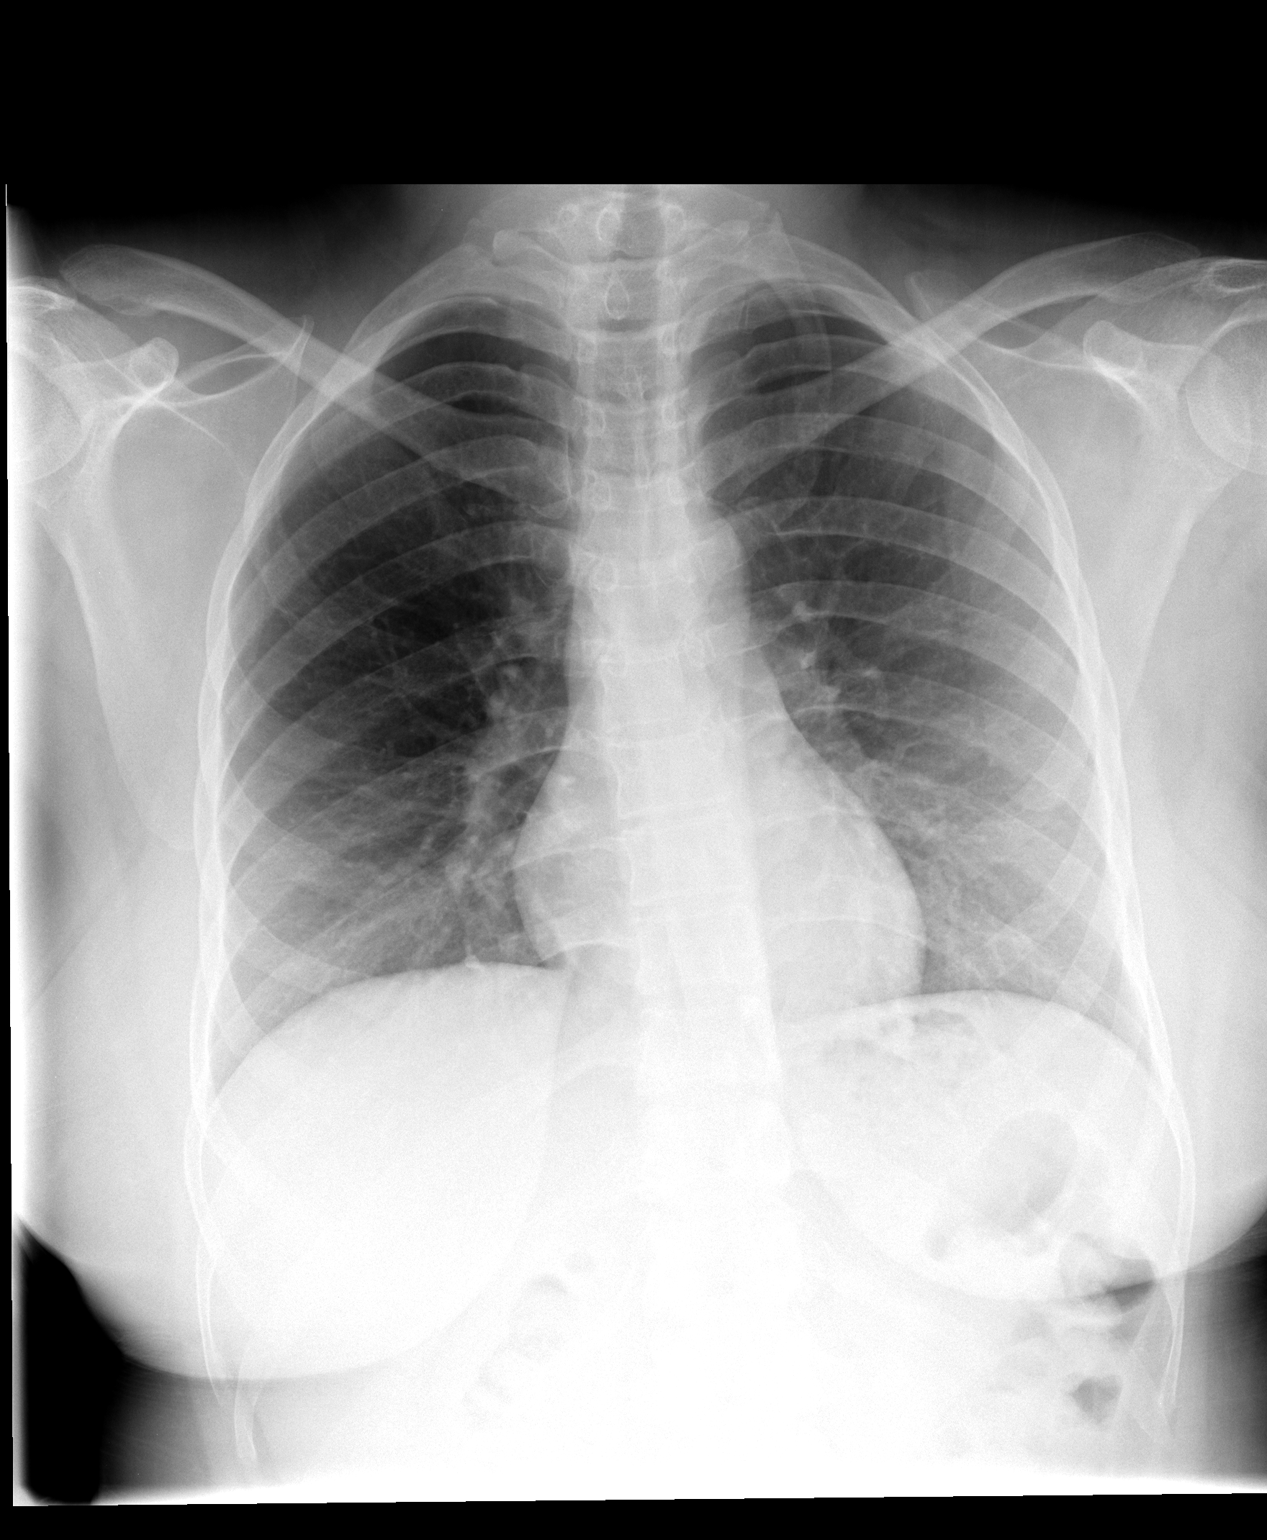

[view not recorded (2 of 2)]
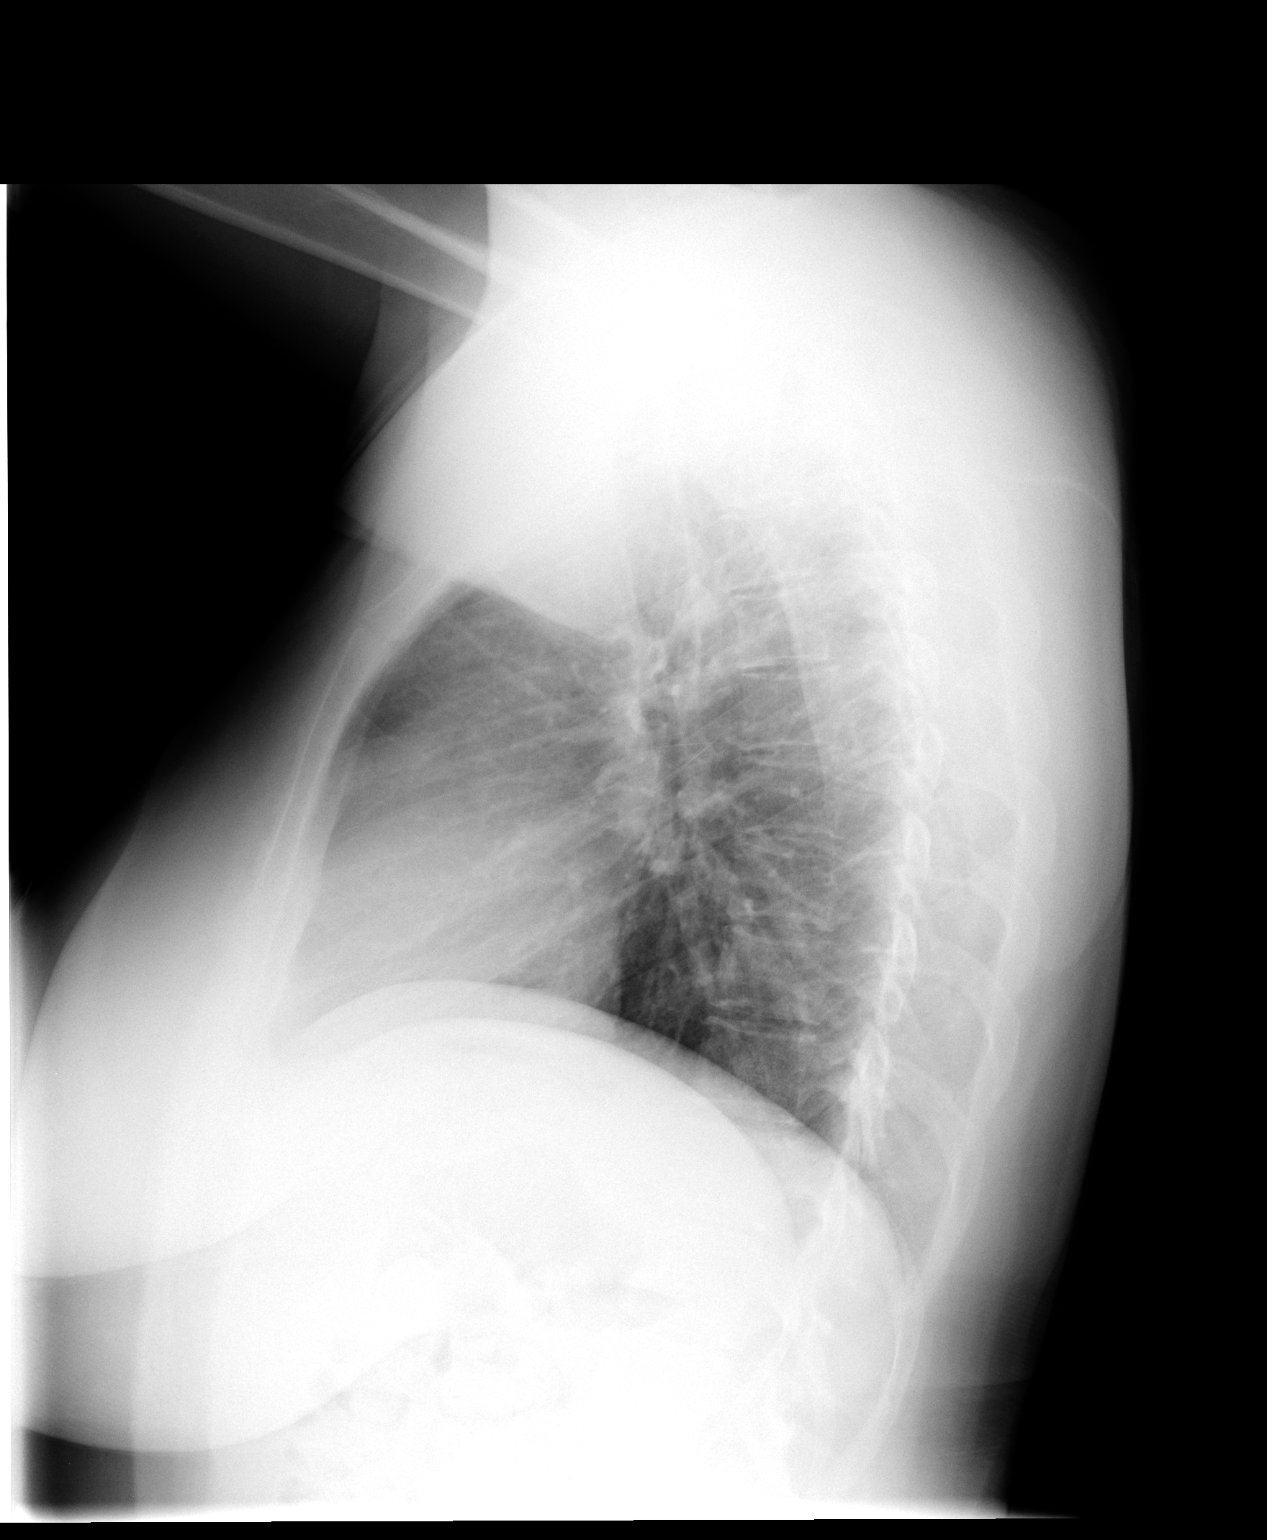

[2 of 2 positions shown; findings below may reference images not displayed]

FINDINGS: The heart size and mediastinal contours are stable.  The
lungs are clear.  There is no pleural effusion or pneumothorax.
There is a large left-sided cervical rib which appears stable.
IMPRESSION: No acute cardiopulmonary process.  Large left-sided cervical rib.

## 2011-09-20 MED ORDER — SALINE NASAL SPRAY 0.65 % NA SOLN: 1.0000 | NASAL | Status: DC | PRN

## 2011-09-20 MED ORDER — CETIRIZINE-PSEUDOEPHEDRINE ER 5-120 MG PO TB12: 1.0000 | ORAL_TABLET | Freq: Two times a day (BID) | ORAL | Status: DC

## 2011-09-20 NOTE — ED Notes (Signed)
Call to waiting area without reply pt assumed to have eloped 

## 2011-09-20 NOTE — Discharge Instructions (Signed)
Pharyngitis, Viral and Bacterial Pharyngitis is soreness (inflammation) or infection of the pharynx. It is also called a sore throat. CAUSES  Most sore throats are caused by viruses and are part of a cold. However, some sore throats are caused by strep and other bacteria. Sore throats can also be caused by post nasal drip from draining sinuses, allergies and sometimes from sleeping with an open mouth. Infectious sore throats can be spread from person to person by coughing, sneezing and sharing cups or eating utensils. TREATMENT  Sore throats that are viral usually last 3-4 days. Viral illness will get better without medications (antibiotics). Strep throat and other bacterial infections will usually begin to get better about 24-48 hours after you begin to take antibiotics. HOME CARE INSTRUCTIONS   If the caregiver feels there is a bacterial infection or if there is a positive strep test, they will prescribe an antibiotic. The full course of antibiotics must be taken. If the full course of antibiotic is not taken, you or your child may become ill again. If you or your child has strep throat and do not finish all of the medication, serious heart or kidney diseases may develop.   Drink enough water and fluids to keep your urine clear or pale yellow.   Only take over-the-counter or prescription medicines for pain, discomfort or fever as directed by your caregiver.   Get lots of rest.   Gargle with salt water ( tsp. of salt in a glass of water) as often as every 1-2 hours as you need for comfort.   Hard candies may soothe the throat if individual is not at risk for choking. Throat sprays or lozenges may also be used.  SEEK MEDICAL CARE IF:   Large, tender lumps in the neck develop.   A rash develops.   Green, yellow-brown or bloody sputum is coughed up.   Your baby is older than 3 months with a rectal temperature of 100.5 F (38.1 C) or higher for more than 1 day.  SEEK IMMEDIATE MEDICAL CARE  IF:   A stiff neck develops.   You or your child are drooling or unable to swallow liquids.   You or your child are vomiting, unable to keep medications or liquids down.   You or your child has severe pain, unrelieved with recommended medications.   You or your child are having difficulty breathing (not due to stuffy nose).   You or your child are unable to fully open your mouth.   You or your child develop redness, swelling, or severe pain anywhere on the neck.   You have a fever.   Your baby is older than 3 months with a rectal temperature of 102 F (38.9 C) or higher.   Your baby is 49 months old or younger with a rectal temperature of 100.4 F (38 C) or higher.  MAKE SURE YOU:   Understand these instructions.   Will watch your condition.   Will get help right away if you are not doing well or get worse.  Document Released: 01/05/2005 Document Revised: 12/25/2010 Document Reviewed: 04/04/2007 [facility] Patient Information 2012 [place], [place].Upper Respiratory Infection, Adult An upper respiratory infection (URI) is also known as the common cold. It is often caused by a type of germ (virus). Colds are easily spread (contagious). You can pass it to others by kissing, coughing, sneezing, or drinking out of the same glass. Usually, you get better in 1 or 2 weeks.  HOME CARE   Only take  medicine as told by your doctor.   Use a warm mist humidifier or breathe in steam from a hot shower.   Drink enough water and fluids to keep your pee (urine) clear or pale yellow.   Get plenty of rest.   Return to work when your temperature is back to normal or as told by your doctor. You may use a face mask and wash your hands to stop your cold from spreading.  GET HELP RIGHT AWAY IF:   After the first few days, you feel you are getting worse.   You have questions about your medicine.   You have chills, shortness of breath, or brown or red spit (mucus).   You have yellow or brown snot  (nasal discharge) or pain in the face, especially when you bend forward.   You have a fever, puffy (swollen) neck, pain when you swallow, or white spots in the back of your throat.   You have a bad headache, ear pain, sinus pain, or chest pain.   You have a high-pitched whistling sound when you breathe in and out (wheezing).   You have a lasting cough or cough up blood.   You have sore muscles or a stiff neck.  MAKE SURE YOU:   Understand these instructions.   Will watch your condition.   Will get help right away if you are not doing well or get worse.  Document Released: 06/24/2007 Document Revised: 12/25/2010 Document Reviewed: 05/12/2010 [facility] Patient Information 2012 [place], [place].

## 2011-09-20 NOTE — ED Provider Notes (Signed)
History     CSN: [id number]  Arrival date & time 09/20/11  1125   First MD Initiated Contact with Patient 09/20/11 1324      Chief Complaint  Patient presents with  . Sore Throat  . Headache    (Consider location/radiation/quality/duration/timing/severity/associated sxs/prior treatment) Patient is a 41 y.o. female presenting with pharyngitis and headaches. The history is provided by the patient.  Sore Throat This is a new problem. Associated symptoms include headaches.  Headache The primary symptoms include headaches.  Maureen [name] is a 41 y.o. female who complains of onset of sore throat and headache symptoms for 2 days.  Scratcy throat + cough, non productive No pleuritic pain No wheezing + nasal congestion No post-nasal drainage + sinus pain/pressure No voice changes No chest congestion No itchy/red eyes No earache No hemoptysis No SOB + chills/sweats No fever No nausea No vomiting No abdominal pain No diarrhea No skin rashes + fatigue No myalgias + headache  No ill contacts  Past Medical History  Diagnosis Date  . Diabetes mellitus   . Fibromyalgia   . Arthritis   . Hyperlipidemia   . Chills   . Abdominal pain   . Nausea & vomiting   . Biliary dyskinesia   . Depression   . Migraine   . Joint pain   . Skin rash     due to medications  . Post traumatic stress disorder (PTSD)   . Sleep trouble   . Major depressive disorder   . Cervical strain     Past Surgical History  Procedure Date  . Tonsillectomy 2001  . Lipoma removed   . Knee surgery   . Cesarean section 1997, 1999  . Abdominal hysterectomy 2001  . Tumor removal 2000    lower back  . Eye surgery 2006    laser eye surgery left eye  . Cholecystectomy 02/10/2011    Procedure: LAPAROSCOPIC CHOLECYSTECTOMY WITH INTRAOPERATIVE CHOLANGIOGRAM;  Surgeon: Rulon Abide, DO;  Location: [facility] OR;  Service: General;  Laterality: N/A;    Family History  Problem Relation Age of Onset  . Cancer  Father     lymphoma  . Nephrolithiasis Father   . Cancer Maternal Grandmother     colon  . Hypertension Mother   . Heart disease Mother   . Diabetes Brother     History  Substance Use Topics  . Smoking status: Never Smoker   . Smokeless tobacco: Never Used  . Alcohol Use: No    OB History    Grav Para Term Preterm Abortions TAB SAB Ect Mult Living                  Review of Systems  Neurological: Positive for headaches.  All other systems reviewed and are negative.    Allergies  Tylenol; Cephalexin; and Ibuprofen  Home Medications   Current Outpatient Rx  Name Route Sig Dispense Refill  . CETIRIZINE-PSEUDOEPHEDRINE ER 5-120 MG PO TB12 Oral Take 1 tablet by mouth 2 (two) times daily. 30 tablet 0  . INSULIN ASPART 100 UNIT/ML [place] SOLN Subcutaneous Inject 0-8 Units into the skin 3 (three) times daily before meals. Sliding scale per MD    . INSULIN GLARGINE 100 UNIT/ML [place] SOLN Subcutaneous Inject 24 Units into the skin at bedtime.     Marland Kitchen METOCLOPRAMIDE HCL 10 MG PO TABS Oral Take 1 tablet (10 mg total) by mouth every 6 (six) hours. 30 tablet 0  . SALINE NASAL SPRAY 0.65 % NA SOLN  Nasal Place 1 spray into the nose as needed for congestion. 30 mL 12    BP 131/72  Pulse 90  Temp 98.6 F (37 C) (Oral)  Resp 18  SpO2 100%  Physical Exam  Nursing note and vitals reviewed. Constitutional: She is oriented to person, place, and time. Vital signs are normal. She appears well-developed and well-nourished. She is active and cooperative.  HENT:  Head: Normocephalic.  Right Ear: Hearing, tympanic membrane, external ear and ear canal normal.  Left Ear: Hearing, tympanic membrane, external ear and ear canal normal.  Nose: Rhinorrhea present.  Mouth/Throat: Uvula is midline, oropharynx is clear and moist and mucous membranes are normal.  Eyes: Conjunctivae are normal. Pupils are equal, round, and reactive to light. No scleral icterus.  Neck: Trachea normal. Neck supple.    Cardiovascular: Normal rate, regular rhythm, normal heart sounds and normal pulses.   Pulmonary/Chest: Effort normal and breath sounds normal.  Lymphadenopathy:       Head (right side): No submental, no submandibular, no tonsillar, no preauricular, no posterior auricular and no occipital adenopathy present.       Head (left side): No submental, no submandibular, no tonsillar, no preauricular, no posterior auricular and no occipital adenopathy present.    She has no cervical adenopathy.  Neurological: She is alert and oriented to person, place, and time. No cranial nerve deficit or sensory deficit.  Skin: Skin is warm and dry.  Psychiatric: She has a normal mood and affect. Her speech is normal and behavior is normal. Judgment and thought content normal. Cognition and memory are normal.    ED Course  Procedures (including critical care time)  Labs Reviewed - No data to display No results found.   1. URI (upper respiratory infection)   2. Pharyngitis       MDM  Labs reviewed from ER visit, pt left without being seen by provider.  Increase fluid intake, rest.  NO antibiotics indicated.  Begin expectorant/decongestant, saline nasal spray and/or saline irrigation, and cough suppressant at bedtime. Antihistamines of your choice (Claritin or Zyrtec).  Tylenol or Motrin for fever/discomfort.  Followup with PCP if not improving 7 to 10 days.        Maureen Kindred, NP 09/20/11 1418

## 2011-09-20 NOTE — ED Notes (Signed)
C/O sore throat and HA on Friday, which resolved by yesterday.  Last night, started w/ severe sore throat again along with HA.  Had "low-grade" fevers.  Went to ED last night - had labs, rapid strep done, but left prior to discharge due to very long wait.  Has been applying ice packs and resting.  CBG = 183 @ 0900 this AM.  Reports long-standing hx of chronic pain, neck pain, abd pain - states no different than usual.

## 2011-09-22 NOTE — ED Provider Notes (Signed)
Medical screening examination/treatment/procedure(s) were performed by non-physician practitioner and as supervising physician I was immediately available for consultation/collaboration.   [facility]; MD   Sharin Grave, MD 09/22/11 [phone]

## 2011-10-02 NOTE — ED Provider Notes (Signed)
Medical screening examination/treatment/procedure(s) were performed by non-physician practitioner and as supervising physician I was immediately available for consultation/collaboration.   Lyanne Co, MD 10/02/11 [phone]

## 2011-10-12 ENCOUNTER — Ambulatory Visit (INDEPENDENT_AMBULATORY_CARE_PROVIDER_SITE_OTHER): Payer: Managed Care, Other (non HMO) | Admitting: Physician Assistant

## 2011-10-12 VITALS — BP 142/88 | HR 85 | Temp 98.4°F | Resp 16 | Ht 64.5 in | Wt 191.2 lb

## 2011-10-12 DIAGNOSIS — W57XXXA Bitten or stung by nonvenomous insect and other nonvenomous arthropods, initial encounter: Secondary | ICD-10-CM

## 2011-10-12 DIAGNOSIS — M7989 Other specified soft tissue disorders: Secondary | ICD-10-CM

## 2011-10-12 DIAGNOSIS — T148 Other injury of unspecified body region: Secondary | ICD-10-CM

## 2011-10-13 ENCOUNTER — Encounter: Payer: Self-pay | Admitting: Physician Assistant

## 2011-10-13 DIAGNOSIS — E109 Type 1 diabetes mellitus without complications: Secondary | ICD-10-CM | POA: Insufficient documentation

## 2011-10-13 NOTE — Progress Notes (Signed)
  Subjective:    Patient ID: Maureen [name], female    DOB: 12-19-1970, 41 y.o.   MRN: [id number]  HPI  Pt presents to clinic with leg swelling and ant bites to boot lower legs and feet.  She got the ant bites 2 days ago and since then she has been itching and her feet have been swelling.  This has happened to her before with ant bites.  She has been using no oral otc medications, she has tried some otc benadryl cream to help with itching.  Her glucose readings have been ok.  She has no SOB, CP.  She has noticed that the swelling is significantly better today than yesterday.  Her feet hurt because the skin is tight.  Review of Systems  Respiratory: Negative for chest tightness and shortness of breath.   Cardiovascular: Positive for leg swelling. Negative for chest pain.  Skin: Positive for rash.       Itching       Objective:   Physical Exam  Vitals reviewed. Constitutional: She appears well-developed and well-nourished.  HENT:  Head: Normocephalic and atraumatic.  Right Ear: External ear normal.  Left Ear: External ear normal.  Eyes: Conjunctivae normal are normal.  Cardiovascular: Normal rate, regular rhythm and normal heart sounds.   Pulses:      Dorsalis pedis pulses are 2+ on the right side, and 2+ on the left side.  Pulmonary/Chest: Effort normal and breath sounds normal.  Musculoskeletal:       Right foot: She exhibits swelling (with her feet, nonpitting.). She exhibits normal capillary refill.       Left foot: She exhibits swelling. She exhibits normal capillary refill.  Skin:       Multiple insect bites seen on bilateral legs.  Some are excoriated and others are just papules.  No signs of infection.          Assessment & Plan:   1. Insect bite   2. Bilateral swelling of feet    Due to her past history of this reaction to ant bites and the fact that her swelling has improved today.  Will have pt use OTC zyrtec daily and benadryl at night to help with the  histamine release and reaction. Pt is to RTC in 48h if not almost completely resolved for a LE edema work-up. If pt developed SOB or CP she should RTC sooner.  Pt understands and agrees.

## 2011-10-23 ENCOUNTER — Encounter: Payer: Self-pay | Admitting: Family Medicine

## 2011-10-23 DIAGNOSIS — M797 Fibromyalgia: Secondary | ICD-10-CM | POA: Insufficient documentation

## 2011-10-23 DIAGNOSIS — G8929 Other chronic pain: Secondary | ICD-10-CM | POA: Insufficient documentation

## 2011-10-31 ENCOUNTER — Emergency Department (HOSPITAL_COMMUNITY)
Admission: EM | Admit: 2011-10-31 | Discharge: 2011-10-31 | Disposition: A | Discharge status: Home / Self Care | Payer: Managed Care, Other (non HMO) | Source: Home / Self Care | Attending: Family Medicine | Admitting: Family Medicine

## 2011-10-31 ENCOUNTER — Encounter (HOSPITAL_COMMUNITY): Payer: Self-pay | Admitting: Emergency Medicine

## 2011-10-31 DIAGNOSIS — R102 Pelvic and perineal pain: Secondary | ICD-10-CM

## 2011-10-31 DIAGNOSIS — N949 Unspecified condition associated with female genital organs and menstrual cycle: Secondary | ICD-10-CM

## 2011-10-31 MED ORDER — DICLOFENAC POTASSIUM 50 MG PO TABS: 50.0000 mg | ORAL_TABLET | Freq: Three times a day (TID) | ORAL | Status: DC

## 2011-10-31 NOTE — ED Notes (Signed)
Pt c/o lower left/flank pain x 3 days that comes and goes. Pt has hx of kidney stones a month ago.  Pt denies nausea, fever, and pain with urinating.

## 2011-10-31 NOTE — ED Provider Notes (Signed)
History     CSN: [id number]  Arrival date & time 10/31/11  1844   First MD Initiated Contact with Patient 10/31/11 1855      Chief Complaint  Patient presents with  . Back Pain    (Consider location/radiation/quality/duration/timing/severity/associated sxs/prior treatment) Patient is a 41 y.o. female presenting with abdominal pain. The history is provided by the patient.  Abdominal Pain The primary symptoms of the illness include abdominal pain. The primary symptoms of the illness do not include fever, nausea, vomiting, diarrhea, hematemesis, vaginal discharge or vaginal bleeding. The current episode started more than 2 days ago. The onset of the illness was gradual. The problem has not changed since onset. The patient states that she believes she is currently not pregnant. The patient has not had a change in bowel habit. Additional symptoms associated with the illness include back pain. Symptoms associated with the illness do not include urgency or frequency. Associated medical issues comments: sev mos ago had ct for presumed stone and told she had a lot of scar tissue from hysterectomy,current sx not as bad as that episode..    Past Medical History  Diagnosis Date  . Diabetes mellitus   . Fibromyalgia   . Arthritis   . Hyperlipidemia   . Chills   . Abdominal pain   . Nausea & vomiting   . Biliary dyskinesia   . Depression   . Migraine   . Joint pain   . Skin rash     due to medications  . Post traumatic stress disorder (PTSD)   . Sleep trouble   . Major depressive disorder   . Cervical strain     Past Surgical History  Procedure Date  . Tonsillectomy 2001  . Lipoma removed   . Knee surgery   . Cesarean section 1997, 1999  . Abdominal hysterectomy 2001  . Tumor removal 2000    lower back  . Eye surgery 2006    laser eye surgery left eye  . Cholecystectomy 02/10/2011    Procedure: LAPAROSCOPIC CHOLECYSTECTOMY WITH INTRAOPERATIVE CHOLANGIOGRAM;  Surgeon: Rulon Abide, DO;  Location: [facility] OR;  Service: General;  Laterality: N/A;    Family History  Problem Relation Age of Onset  . Cancer Father     lymphoma  . Nephrolithiasis Father   . Cancer Maternal Grandmother     colon  . Hypertension Mother   . Heart disease Mother   . Diabetes Brother     History  Substance Use Topics  . Smoking status: Never Smoker   . Smokeless tobacco: Never Used  . Alcohol Use: No    OB History    Grav Para Term Preterm Abortions TAB SAB Ect Mult Living                  Review of Systems  Constitutional: Negative.  Negative for fever.  Gastrointestinal: Positive for abdominal pain. Negative for nausea, vomiting, diarrhea and hematemesis.  Genitourinary: Positive for pelvic pain. Negative for urgency, frequency, flank pain, decreased urine volume, vaginal bleeding, vaginal discharge and vaginal pain.  Musculoskeletal: Positive for back pain.    Allergies  Tylenol; Cephalexin; and Ibuprofen  Home Medications   Current Outpatient Rx  Name Route Sig Dispense Refill  . CETIRIZINE-PSEUDOEPHEDRINE ER 5-120 MG PO TB12 Oral Take 1 tablet by mouth 2 (two) times daily. 30 tablet 0  . DICLOFENAC POTASSIUM 50 MG PO TABS Oral Take 1 tablet (50 mg total) by mouth 3 (three) times daily. As needed  for pain 30 tablet 0  . INSULIN ASPART 100 UNIT/ML [place] SOLN Subcutaneous Inject 0-8 Units into the skin 3 (three) times daily before meals. Sliding scale per MD    . INSULIN GLARGINE 100 UNIT/ML [place] SOLN Subcutaneous Inject 24 Units into the skin at bedtime.     Marland Kitchen METOCLOPRAMIDE HCL 10 MG PO TABS Oral Take 1 tablet (10 mg total) by mouth every 6 (six) hours. 30 tablet 0  . SALINE NASAL SPRAY 0.65 % NA SOLN Nasal Place 1 spray into the nose as needed for congestion. 30 mL 12    BP 156/71  Pulse 89  Temp 98.1 F (36.7 C) (Oral)  Resp 18  SpO2 99%  Physical Exam  Nursing note and vitals reviewed. Constitutional: She is oriented to person, place, and time. She  appears well-developed and well-nourished. No distress.  Neck: Normal range of motion. Neck supple.  Abdominal: Soft. Bowel sounds are normal. She exhibits no distension and no mass. There is no hepatosplenomegaly. There is tenderness in the right lower quadrant, suprapubic area and left lower quadrant. There is no rigidity, no rebound, no guarding, no CVA tenderness and no tenderness at McBurney's point.  Lymphadenopathy:    She has no cervical adenopathy.  Neurological: She is alert and oriented to person, place, and time.  Skin: Skin is warm and dry.    ED Course  Procedures (including critical care time)  Labs Reviewed - No data to display No results found.   1. Pelvic pain in female       MDM          Linna Hoff, MD 10/31/11 [phone]

## 2011-11-01 LAB — POCT URINALYSIS DIP (DEVICE)
Bilirubin Urine: NEGATIVE
Glucose, UA: 500 mg/dL — AB
Hgb urine dipstick: NEGATIVE
Ketones, ur: NEGATIVE mg/dL
Leukocytes, UA: NEGATIVE
Nitrite: NEGATIVE
Protein, ur: 30 mg/dL — AB
Specific Gravity, Urine: 1.015 (ref 1.005–1.030)
Urobilinogen, UA: 0.2 mg/dL (ref 0.0–1.0)
pH: 6 (ref 5.0–8.0)

## 2011-11-26 ENCOUNTER — Ambulatory Visit: Payer: Managed Care, Other (non HMO) | Admitting: Family Medicine

## 2011-12-08 ENCOUNTER — Ambulatory Visit (INDEPENDENT_AMBULATORY_CARE_PROVIDER_SITE_OTHER): Payer: Managed Care, Other (non HMO) | Admitting: Family Medicine

## 2011-12-08 ENCOUNTER — Encounter: Payer: Self-pay | Admitting: Family Medicine

## 2011-12-08 VITALS — BP 138/74 | HR 86 | Temp 98.3°F | Resp 16 | Ht 64.5 in | Wt 194.0 lb

## 2011-12-08 DIAGNOSIS — E11319 Type 2 diabetes mellitus with unspecified diabetic retinopathy without macular edema: Secondary | ICD-10-CM | POA: Insufficient documentation

## 2011-12-08 DIAGNOSIS — Z7189 Other specified counseling: Secondary | ICD-10-CM

## 2011-12-08 DIAGNOSIS — M1712 Unilateral primary osteoarthritis, left knee: Secondary | ICD-10-CM

## 2011-12-08 DIAGNOSIS — E109 Type 1 diabetes mellitus without complications: Secondary | ICD-10-CM

## 2011-12-08 DIAGNOSIS — N809 Endometriosis, unspecified: Secondary | ICD-10-CM | POA: Insufficient documentation

## 2011-12-08 DIAGNOSIS — Z23 Encounter for immunization: Secondary | ICD-10-CM

## 2011-12-08 DIAGNOSIS — E785 Hyperlipidemia, unspecified: Secondary | ICD-10-CM

## 2011-12-08 MED ORDER — PNEUMOCOCCAL VAC POLYVALENT 25 MCG/0.5ML IJ INJ: 0.5000 mL | INJECTION | INTRAMUSCULAR | Status: DC

## 2011-12-08 NOTE — Progress Notes (Signed)
@[email or web address] @  Patient ID: Maureen [name] MRN: [id number], DOB: 12/22/1970, 41 y.o. Date of Encounter: 12/08/2011, 2:34 PM  Primary Physician: Elvina Sidle, MD  Chief Complaint: Diabetes follow up  HPI: 41 y.o. year old female with history below presents for follow up of diabetes mellitus. Doing well. No issues or complaints. Taking medications daily without adverse effects. No polydipsia, polyphagia, polyuria, or nocturia.  Blood sugars at home: vary, but most are under 150 Diet consists of: some indiscretions lately Exercising regularly now with son Last A1C:  3 months ago \\IDDM  x 30 years with positive F/H, diabetic retinopathy  Eye MD: due this December DDS: dental works on Radiation protection practitioner, last seen last summer Influenza vaccine: this month Pneumococcal vaccine: Last CPE: last year Seeing Noland Fordyce for the abdominal pain.  Has a h/o kidney stone, hysterectomy and R oophorectomy.  Has Left ovarian cyst and had U/S 3 weeks ago with another due in 3 weeks.  Past Medical History  Diagnosis Date  . Diabetes mellitus   . Fibromyalgia   . Arthritis   . Hyperlipidemia   . Chills   . Abdominal pain   . Nausea & vomiting   . Biliary dyskinesia   . Depression   . Migraine   . Joint pain   . Skin rash     due to medications  . Post traumatic stress disorder (PTSD)   . Sleep trouble   . Major depressive disorder   . Cervical strain      Home Meds: Prior to Admission medications   Medication Sig Start Date End Date Taking? Authorizing Provider  insulin aspart (NOVOLOG) 100 UNIT/ML injection Inject 0-8 Units into the skin 3 (three) times daily before meals. Sliding scale per MD   Yes Historical Provider, MD  cetirizine-pseudoephedrine (ZYRTEC-D) 5-120 MG per tablet Take 1 tablet by mouth 2 (two) times daily. 09/20/11 09/19/12  Johnsie Kindred, NP  diclofenac (CATAFLAM) 50 MG tablet Take 1 tablet (50 mg total) by mouth 3 (three) times daily. As needed for pain 10/31/11   Linna Hoff, MD  insulin glargine (LANTUS) 100 UNIT/ML injection Inject 24 Units into the skin at bedtime.     Historical Provider, MD  metoCLOPramide (REGLAN) 10 MG tablet Take 1 tablet (10 mg total) by mouth every 6 (six) hours. 08/19/11 08/29/11  Burgess Amor, PA  sodium chloride (OCEAN NASAL SPRAY) 0.65 % nasal spray Place 1 spray into the nose as needed for congestion. 09/20/11 09/19/12  Johnsie Kindred, NP    Allergies:  Allergies  Allergen Reactions  . Tylenol (Acetaminophen) Other (See Comments)    Liver enzymes elevated.  . Cephalexin Rash  . Ibuprofen Nausea And Vomiting and Rash    History   Social History  . Marital Status: Married    Spouse Name: N/A    Number of Children: N/A  . Years of Education: N/A   Occupational History  . Not on file.   Social History Main Topics  . Smoking status: Never Smoker   . Smokeless tobacco: Never Used  . Alcohol Use: No  . Drug Use: No  . Sexually Active: Yes    Birth Control/ Protection: Surgical   Other Topics Concern  . Not on file   Social History Narrative  . No narrative on file     Review of Systems: Constitutional: negative for chills, fever, night sweats, weight changes, or fatigue  HEENT: negative for vision changes, hearing loss, congestion, rhinorrhea, or epistaxis Cardiovascular: negative for chest  pain, palpitations, diaphoresis, DOE, orthopnea, or edema Respiratory: negative for hemoptysis, wheezing, shortness of breath, dyspnea, or cough Abdominal: negative for abdominal pain, nausea, vomiting, diarrhea, or constipation Dermatological: negative for rash, erythema, or wounds Positive for abdominal pain (endometriosis) Neurologic: negative for headache, dizziness, or syncope Renal:  Negative for polyuria, polydipsia, or dysuria All other systems reviewed and are otherwise negative with the exception to those above and in the HPI.   Physical Exam: Blood pressure 138/74, pulse 86, temperature 98.3 F (36.8 C),  resp. rate 16, height 5' 4.5" (1.638 m), weight 194 lb (87.998 kg)., Body mass index is 32.79 kg/(m^2). General: Well developed, well nourished, in no acute distress. Head: Normocephalic, atraumatic, eyes without discharge, sclera non-icteric, nares are without discharge. Old scars from laser, increased cupping bilaterally, no hemorrhages. Bilateral auditory canals clear, TM's are without perforation, pearly grey and translucent with reflective cone of light bilaterally. Oral cavity moist, posterior pharynx without exudate, erythema, peritonsillar abscess, or post nasal drip.  Neck: Supple. No thyromegaly. Full ROM. No lymphadenopathy. Lungs: Clear bilaterally to auscultation without wheezes, rales, or rhonchi. Breathing is unlabored. Heart: RRR with S1 S2. No murmurs, rubs, or gallops appreciated. Abdomen: Soft, non-tender, non-distended with normoactive bowel sounds. No hepatosplenomegaly. No rebound/guarding. No obvious abdominal masses. Msk:  Strength and tone normal for age. Extremities/Skin: Warm and dry. No clubbing or cyanosis. No edema. No rashes, wounds, or suspicious lesions. Monofilament exam unremarkable bilaterally.  Neuro: Alert and oriented X 3. Moves all extremities spontaneously. Gait is normal. CNII-XII grossly in tact.  Normal filament test Psych:  Responds to questions appropriately with a normal affect.   Labs:   ASSESSMENT AND PLAN:  40 y.o. year old female with type I diabetes.  -  Signed, Elvina Sidle, MD 12/08/2011 2:34 PM

## 2011-12-09 LAB — MICROALBUMIN, URINE: Microalb, Ur: 71.7 mg/dL — ABNORMAL HIGH (ref 0.00–1.89)

## 2011-12-25 ENCOUNTER — Emergency Department (HOSPITAL_COMMUNITY)
Admission: EM | Admit: 2011-12-25 | Discharge: 2011-12-25 | Disposition: A | Discharge status: Home / Self Care | Payer: Managed Care, Other (non HMO) | Attending: Emergency Medicine | Admitting: Emergency Medicine

## 2011-12-25 ENCOUNTER — Emergency Department (HOSPITAL_COMMUNITY): Payer: Managed Care, Other (non HMO)

## 2011-12-25 ENCOUNTER — Encounter (HOSPITAL_COMMUNITY): Payer: Self-pay | Admitting: *Deleted

## 2011-12-25 DIAGNOSIS — Z794 Long term (current) use of insulin: Secondary | ICD-10-CM | POA: Insufficient documentation

## 2011-12-25 DIAGNOSIS — M5412 Radiculopathy, cervical region: Secondary | ICD-10-CM | POA: Insufficient documentation

## 2011-12-25 DIAGNOSIS — Z79899 Other long term (current) drug therapy: Secondary | ICD-10-CM | POA: Insufficient documentation

## 2011-12-25 DIAGNOSIS — Z8659 Personal history of other mental and behavioral disorders: Secondary | ICD-10-CM | POA: Insufficient documentation

## 2011-12-25 DIAGNOSIS — Z8739 Personal history of other diseases of the musculoskeletal system and connective tissue: Secondary | ICD-10-CM | POA: Insufficient documentation

## 2011-12-25 DIAGNOSIS — E119 Type 2 diabetes mellitus without complications: Secondary | ICD-10-CM | POA: Insufficient documentation

## 2011-12-25 DIAGNOSIS — Z9889 Other specified postprocedural states: Secondary | ICD-10-CM | POA: Insufficient documentation

## 2011-12-25 DIAGNOSIS — M79609 Pain in unspecified limb: Secondary | ICD-10-CM

## 2011-12-25 HISTORY — DX: Unspecified injury of head, initial encounter: S09.90XA

## 2011-12-25 LAB — GLUCOSE, CAPILLARY: Glucose-Capillary: 86 mg/dL (ref 70–99)

## 2011-12-25 MED ORDER — OXYCODONE HCL 5 MG PO TABS
5.0000 mg | ORAL_TABLET | Freq: Once | ORAL | Status: AC
  Administered 2011-12-25: 5 mg via ORAL
  Filled 2011-12-25: qty 1

## 2011-12-25 MED ORDER — OXYCODONE HCL 5 MG PO TABS: 5.0000 mg | ORAL_TABLET | ORAL | Status: DC | PRN

## 2011-12-25 NOTE — ED Notes (Signed)
Pt c/o numbness in a "strip" down inner aspect of right arm.Fingers are numb. Also has right neck pain and headache. Hx of CHI 4 yrs ago.

## 2011-12-25 NOTE — Progress Notes (Signed)
VASCULAR LAB PRELIMINARY  PRELIMINARY  PRELIMINARY  PRELIMINARY  Right upper extremity venous duplex completed.    Preliminary report:  Right:  No evidence of DVT or superficial thrombosis.    Reon Hunley, RVT 12/25/2011, 5:10 PM

## 2011-12-25 NOTE — ED Provider Notes (Signed)
History   This chart was scribed for Glynn Octave, MD by Toya Smothers, ED Scribe. The patient was seen in room TR07C/TR07C. Patient's care was started at 1325.  CSN: [id number]  Arrival date & time 12/25/11  1325   First MD Initiated Contact with Patient 12/25/11 1355      Chief Complaint  Patient presents with  . Numbness  . Tingling    HPI   Maureen [name] is a 41 y.o. female who presents to the Emergency Department complaining of 12 hours of gradual onset numbness and tingling to the right arm, with associate intermittent nause. Pain is unlike any before, worse with exertion, and described as a "swelling soreness" that radiates from the neck to the wrist. No injury mechanism is denoted. No fever, chills, cough, congestion, rhinorrhea, chest pain, SOB, or n/v/d. Pt denies use of tobacco products, consumption of alcohol, and use of illicit drugs. Medical Hx includes Diabetes mellitus, fibromyalgia, joint pain, and cervical strain. Surgical Hx includes Cholecystectomy, eye surgery, tumor removal, hysterectomy, and knee surgery.      Past Medical History  Diagnosis Date  . Diabetes mellitus   . Fibromyalgia   . Arthritis   . Hyperlipidemia   . Chills   . Abdominal pain   . Nausea & vomiting   . Biliary dyskinesia   . Depression   . Migraine   . Joint pain   . Skin rash     due to medications  . Post traumatic stress disorder (PTSD)   . Sleep trouble   . Major depressive disorder   . Cervical strain   . Closed head injury     Past Surgical History  Procedure Date  . Tonsillectomy 2001  . Lipoma removed   . Knee surgery   . Cesarean section 1997, 1999  . Abdominal hysterectomy 2001  . Tumor removal 2000    lower back  . Eye surgery 2006    laser eye surgery left eye  . Cholecystectomy 02/10/2011    Procedure: LAPAROSCOPIC CHOLECYSTECTOMY WITH INTRAOPERATIVE CHOLANGIOGRAM;  Surgeon: Rulon Abide, DO;  Location: [facility] OR;  Service: General;  Laterality: N/A;     Family History  Problem Relation Age of Onset  . Cancer Father     lymphoma  . Nephrolithiasis Father   . Cancer Maternal Grandmother     colon  . Hypertension Mother   . Heart disease Mother   . Diabetes Brother     History  Substance Use Topics  . Smoking status: Never Smoker   . Smokeless tobacco: Never Used  . Alcohol Use: No   Review of Systems  Cardiovascular: Negative for chest pain.  Gastrointestinal: Positive for nausea.    Allergies  Tylenol; Cephalexin; and Ibuprofen  Home Medications   Current Outpatient Rx  Name  Route  Sig  Dispense  Refill  . INSULIN ASPART 100 UNIT/ML [place] SOLN   Subcutaneous   Inject 0-8 Units into the skin 3 (three) times daily before meals. Sliding scale per MD         . INSULIN GLARGINE 100 UNIT/ML [place] SOLN   Subcutaneous   Inject 24 Units into the skin at bedtime.          . OXYCODONE HCL 5 MG PO TABS   Oral   Take 5 mg by mouth every 4 (four) hours as needed. For pain         . PRESCRIPTION MEDICATION   Oral   Take 1 tablet by  mouth daily. Estrogen pill           BP 173/91  Pulse 94  Temp 98.4 F (36.9 C) (Oral)  Resp 16  SpO2 99%  Physical Exam  Nursing note and vitals reviewed. Constitutional: She appears well-developed and well-nourished.  HENT:  Head: Normocephalic and atraumatic.  Eyes: Conjunctivae normal are normal. Pupils are equal, round, and reactive to light.  Neck: Neck supple. No tracheal deviation present. No thyromegaly present.  Cardiovascular: Normal rate and regular rhythm.   No murmur heard.      +2 radial pulse. Less than 2 sec capillary refill.  Pulmonary/Chest: Effort normal and breath sounds normal.  Abdominal: Soft. Bowel sounds are normal. She exhibits no distension. There is no tenderness.  Musculoskeletal: Normal range of motion. She exhibits no edema.       Grip strength is good.Pain to palpation over proximal forearm.   Neurological: She is alert. Coordination normal.        Cardinal hand movements are intact.   Skin: Skin is warm and dry. No rash noted.  Psychiatric: She has a normal mood and affect.    ED Course  Procedures DIAGNOSTIC STUDIES: Oxygen Saturation is 99% on room air, normal by my interpretation.    COORDINATION OF CARE: 13:57- Ordered POCT CBG monitoring Once. 14:03- Evaluated Pt. Pt is awake, alert, and without distress. 14:08- Patient understand and agree with initial ED impression and plan with expectations set for ED visit. 14:10- Ordered DG Cervical Spine Complete 1 time imaging.    Labs Reviewed  GLUCOSE, CAPILLARY   Dg Cervical Spine Complete  12/25/2011  *RADIOLOGY REPORT*  Clinical Data: Right arm pain and weakness.  CERVICAL SPINE - COMPLETE 4+ VIEW  Comparison: MRI of the cervical spine 10/17/2009.  Findings: The cervical spine is visualized from skull base through the cervicothoracic junction.  The prevertebral soft tissues are normal.  Vertebral body heights and alignment are normal. Straightening of the normal cervical lordosis is similar to the prior exam.  The foramina are patent bilaterally.  Bilateral cervical ribs are noted at C7.  The lung apices are clear.  IMPRESSION:  1.  The no acute abnormality. 2.  Bilateral cervical ribs.   Original Report Authenticated By: Marin Roberts, M.D.      No diagnosis found.    MDM  Right arm pain with tingling and numbness for the past 2 days.  Denies injury. Pain radiates from neck down arm.  TTP proximal forearm. No neuro or pulse deficits. Equal grip strengths.   Suspect cervical radiculopathy. No weakness on exam. C spine Xray neg.  Doppler neg for DVT.\ EKG nonischemic. History not consistent with ACS or PE. Avoiding steroids in setting of DM.  Antiinflammatories and steroids given.    Date: 12/25/2011  Rate: 85  Rhythm: normal sinus rhythm  QRS Axis: normal  Intervals: normal  ST/T Wave abnormalities: normal  Conduction Disutrbances:none  Narrative  Interpretation:   Old EKG Reviewed: none available     I personally performed the services described in this documentation, which was scribed in my presence. The recorded information has been reviewed and is accurate.    Glynn Octave, MD 12/25/11 [phone]

## 2011-12-25 NOTE — ED Notes (Signed)
Pt asking for snack to prevent hypoglycemia. Given PB crackers. MD made aware.

## 2011-12-25 NOTE — ED Notes (Signed)
To ED for eval of intermittent right arm tingling and pain. States sensation changes with position. Denies neck injury. Mae x4 freely. Radial pulses equal and strong.

## 2011-12-25 NOTE — ED Notes (Signed)
Patient transported to X-ray 

## 2011-12-25 NOTE — ED Notes (Signed)
US at bedside

## 2011-12-28 ENCOUNTER — Ambulatory Visit (INDEPENDENT_AMBULATORY_CARE_PROVIDER_SITE_OTHER): Payer: Managed Care, Other (non HMO) | Admitting: Internal Medicine

## 2011-12-28 VITALS — BP 173/90 | HR 92 | Temp 98.7°F | Resp 18 | Ht 64.0 in | Wt 191.8 lb

## 2011-12-28 DIAGNOSIS — M5412 Radiculopathy, cervical region: Secondary | ICD-10-CM

## 2011-12-28 MED ORDER — OXYCODONE HCL 5 MG PO TABS: 5.0000 mg | ORAL_TABLET | ORAL | Status: DC | PRN

## 2011-12-28 MED ORDER — CYCLOBENZAPRINE HCL 5 MG PO TABS: 5.0000 mg | ORAL_TABLET | Freq: Three times a day (TID) | ORAL | Status: DC | PRN

## 2011-12-28 NOTE — Progress Notes (Signed)
  Subjective:    Patient ID: Maureen [name], female    DOB: 02-04-1970, 41 y.o.   MRN: [id number]  HPI Neck pain with radiation down the right arm for the past week. Was seen in the er on 12/6 and rx with narcotic pain meds for 15 doses . Is to f/up with dr Milus Glazier. Not started on steroids because of diabetes. Pain was improving now is increased again. Tingling in fingers on the left hand. Pain 5/10. No increase with motion.    Review of Systems  Constitutional: Negative.   HENT: Negative.   Eyes: Negative.   Respiratory: Negative.   Cardiovascular: Negative.   Gastrointestinal: Negative.   Genitourinary: Negative.   Musculoskeletal: Positive for back pain.  Skin: Negative.   Neurological: Positive for numbness.  Hematological: Negative.   Psychiatric/Behavioral: Negative.   All other systems reviewed and are negative.       Objective:   Physical Exam  Constitutional: She is oriented to person, place, and time. She appears well-developed and well-nourished.  HENT:  Head: Normocephalic and atraumatic.  Right Ear: External ear normal.  Left Ear: External ear normal.  Nose: Nose normal.  Mouth/Throat: Oropharynx is clear and moist.  Eyes: Conjunctivae normal and EOM are normal. Pupils are equal, round, and reactive to light.  Neck: Normal range of motion. Neck supple.       Tender muscle spasm of the cervical spine. Lipoma at nape of neck. Acanthosis nigrans  Cardiovascular: Normal rate, regular rhythm, normal heart sounds and intact distal pulses.   Pulmonary/Chest: Effort normal and breath sounds normal.  Abdominal: Soft.  Musculoskeletal: She exhibits tenderness.       muscle spasm cervical muscles  Lymphadenopathy:    She has no cervical adenopathy.  Neurological: She is alert and oriented to person, place, and time. She has normal reflexes.  Skin: Skin is warm and dry.  Psychiatric: She has a normal mood and affect. Her behavior is normal. Judgment and thought  content normal.          Assessment & Plan:  Cervical radiculopathy. Will rx with narcotic analgesics and muscle relaxants. Will avoid steroids because  of diabetes. If no improvement with meds suggest mri c spine.

## 2011-12-28 NOTE — Patient Instructions (Signed)
Take meds as directed. followup in one week .

## 2011-12-31 ENCOUNTER — Telehealth: Payer: Self-pay

## 2011-12-31 NOTE — Telephone Encounter (Signed)
This patient was seen Monday 12/28/11 by dr Mindi Junker, and has scheduled an appointment on 01/05/12 to see chelle jeffrey for follow up. However, patient states she will be out of oxycontin and wants to know if rx can be refilled

## 2011-12-31 NOTE — Telephone Encounter (Signed)
Chelle, please advise 

## 2011-12-31 NOTE — Telephone Encounter (Signed)
I believe the patient should RTC for re-evaluation.  1. I haven't seen her (at least not since 02/17/2011), so I'm not comfortable refilling oxycodone.  It appears that Dr. Milus Glazier did her last chronic illness evaluation last month, and is listed as her PCP.  2. She scheduled an appointment with me for today, but didn't come/cancelled and did not reschedule.

## 2012-01-01 NOTE — Telephone Encounter (Signed)
Called patient to advise. She is in a lot of pain with her neck, she will try to come in and see you soon. She is given Chelle's hours. Patient is asking if we can proceed with a MRI Scan of her neck, she states she is in severe pain, has seen Dr Glenis Smoker and ER.

## 2012-01-02 NOTE — Telephone Encounter (Signed)
She'll still need to come in.  If an MRI is appropriate on re-evaluation, we can schedule that.

## 2012-01-04 NOTE — Telephone Encounter (Signed)
Called patient and she was advised.

## 2012-01-04 NOTE — Telephone Encounter (Signed)
Left message on machine to call back  

## 2012-01-05 ENCOUNTER — Encounter: Payer: Self-pay | Admitting: Physician Assistant

## 2012-01-05 ENCOUNTER — Ambulatory Visit (INDEPENDENT_AMBULATORY_CARE_PROVIDER_SITE_OTHER): Payer: Managed Care, Other (non HMO) | Admitting: Physician Assistant

## 2012-01-05 VITALS — BP 144/86 | HR 98 | Temp 98.6°F | Resp 18 | Ht 64.0 in | Wt 190.4 lb

## 2012-01-05 DIAGNOSIS — M5412 Radiculopathy, cervical region: Secondary | ICD-10-CM

## 2012-01-05 DIAGNOSIS — L723 Sebaceous cyst: Secondary | ICD-10-CM

## 2012-01-05 MED ORDER — CELECOXIB 200 MG PO CAPS: 200.0000 mg | ORAL_CAPSULE | Freq: Two times a day (BID) | ORAL | Status: DC

## 2012-01-05 MED ORDER — OXYCODONE HCL 5 MG PO TABS: 30.0000 mg | ORAL_TABLET | Freq: Three times a day (TID) | ORAL | Status: DC | PRN

## 2012-01-05 NOTE — Progress Notes (Signed)
Subjective:    Patient ID: Maureen [name], female    DOB: 29-May-1970, 41 y.o.   MRN: [id number]  HPI This 41 y.o. female presents for evaluation of RIGHT arm pain and numbess.  Symptoms began several days prior to an ED visit 12/25/2011.  A cspine series there was negative, and the diagnosis was cervical radiculopathy.  She was given OxyIR for pain relief (she doesn't tolerate acetaminophen or ibuprofen, nor meloxicam).  She followed up here on 12/28/2011 and the OxyIR was refilled and she was also given Flexeril, which is ineffective, even at 10 mg TID.  The pain and numbness are intermittent, but can get severe and wake her from sleep.  She wants an MRI of her neck.  She is not dropping things, can feel with her finger tips though the sensation is diminished compared with the LEFT.  No swelling.  She wonders if a lump under her RIGHT arm could be causing the pain.  The lump has been there for several months, not changing in size or draining.  Has had problems with her neck previously, and has seen ortho previously, but doesn't recall which practice.   Past Medical History  Diagnosis Date  . Diabetes mellitus-type 1   . Fibromyalgia   . Arthritis   . Hyperlipidemia   . Chills   . Abdominal pain   . Nausea & vomiting   . Biliary dyskinesia   . Depression   . Migraine   . Joint pain   . Skin rash     due to medications  . Post traumatic stress disorder (PTSD)   . Sleep trouble   . Major depressive disorder   . Cervical strain   . Closed head injury     Past Surgical History  Procedure Date  . Tonsillectomy 2001  . Lipoma removed   . Knee surgery   . Cesarean section 1997, 1999  . Abdominal hysterectomy 2001  . Tumor removal 2000    lower back  . Eye surgery 2006    laser eye surgery left eye  . Cholecystectomy 02/10/2011    Procedure: LAPAROSCOPIC CHOLECYSTECTOMY WITH INTRAOPERATIVE CHOLANGIOGRAM;  Surgeon: Rulon Abide, DO;  Location: [facility] OR;  Service: General;   Laterality: N/A;    Prior to Admission medications   Medication Sig Start Date End Date Taking? Authorizing Provider  cyclobenzaprine (FLEXERIL) 5 MG tablet Take 1 tablet (5 mg total) by mouth 3 (three) times daily as needed for muscle spasms. 12/28/11  Yes Sherlyn Hay, MD  insulin aspart (NOVOLOG) 100 UNIT/ML injection Inject 0-8 Units into the skin 3 (three) times daily before meals. Sliding scale per MD   Yes Historical Provider, MD  insulin glargine (LANTUS) 100 UNIT/ML injection Inject 24 Units into the skin at bedtime.    Yes Historical Provider, MD  leuprolide (LUPRON) 3.75 MG injection Inject 3.75 mg into the muscle every 28 (twenty-eight) days.   Yes Historical Provider, MD  PRESCRIPTION MEDICATION Take 1 tablet by mouth daily. Estrogen pill   Yes Historical Provider, MD    Allergies  Allergen Reactions  . Meloxicam Nausea Only  . Tylenol (Acetaminophen) Other (See Comments)    Liver enzymes elevated.  . Cephalexin Rash  . Ibuprofen Nausea And Vomiting and Rash    History   Social History  . Marital Status: Married    Spouse Name: Haani Bakula    Number of Children: 2  . Years of Education: 12+   Occupational History  . Student-[employer]   Social History Main Topics  . Smoking status: Never Smoker   . Smokeless tobacco: Never Used  . Alcohol Use: No  . Drug Use: No  . Sexually Active: Yes -- Female partner(s)    Birth Control/ Protection: Surgical   Other Topics Concern  . Not on file   Social History Narrative   Lives with husband and two sons.  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  Stop working when she was diagnosed with endometriosis and had a TIA.    Family History  Problem Relation Age of Onset  . Cancer Father     lymphoma  . Nephrolithiasis Father   . Cancer Maternal Grandmother     colon  . Hypertension Mother   . Heart disease Mother   . Diabetes Brother     Review of  Systems As above.    Objective:   Physical Exam  Blood pressure 144/86, pulse 98, temperature 98.6 F (37 C), temperature source Oral, resp. rate 18, height 5\' 4"  (1.626 m), weight 190 lb 6.4 oz (86.365 kg), SpO2 99.00%. Body mass index is 32.68 kg/(m^2). Well-developed, well nourished BF who is awake, alert and oriented, in NAD. HEENT: [place]/AT, sclera and conjunctiva are clear.   Neck: supple, non-tender, no lymphadenopathy, thyromegaly. Heart: RRR, no murmur Lungs: normal effort, CTA Abdomen: normo-active bowel sounds, supple, non-tender, no mass or organomegaly. Extremities: no cyanosis, clubbing or edema. The RIGHT axilla is notable for a pea-sized lump, consistent with sebaceous cyst.  Strength is symmetrically strong in the upper extremities and DTRs aw well.  She has subjective decreased sensation of the RIGHT upper arm and hand compared with the LEFT.  Radial pulses are symmetrically strong. Skin: warm and dry without rash. Psychologic: good mood and appropriate affect, normal speech and behavior.     Assessment & Plan:   1. Cervical radiculopathy  celecoxib (CELEBREX) 200 MG capsule, Nerve conduction test, oxyCODONE (ROXICODONE) 5 MG immediate release tablet; MRI vs. Referral pending NCS results.  2. Sebaceous cyst of right axilla  Reassured.  Anticipatory guidance provided.   Follow-up with PCP (Dr. Milus Glazier) regarding chronic medical problems.

## 2012-01-05 NOTE — Patient Instructions (Addendum)
If the lump under your arm becomes swollen, red and/or painful, return for re-evaluation. Call if you haven't heard about the nerve conduction test by Thursday, call us.  Depending on the results, we will refer you or order an MRI.

## 2012-01-11 ENCOUNTER — Telehealth: Payer: Self-pay

## 2012-01-11 DIAGNOSIS — M542 Cervicalgia: Secondary | ICD-10-CM

## 2012-01-11 MED ORDER — TRAMADOL HCL 50 MG PO TABS: 50.0000 mg | ORAL_TABLET | Freq: Three times a day (TID) | ORAL | Status: DC | PRN

## 2012-01-11 NOTE — Telephone Encounter (Signed)
Pt is needing are refill on her pain meds  Best number [phone]

## 2012-01-11 NOTE — Telephone Encounter (Signed)
Do not want to refill oxycodone

## 2012-01-11 NOTE — Telephone Encounter (Signed)
I sent the meds to the pharmacy.

## 2012-01-11 NOTE — Telephone Encounter (Signed)
Nerve study put in as referral to Neurology. Please advise on renewal of Oxycodone

## 2012-01-11 NOTE — Telephone Encounter (Signed)
Forward to Dr. Milus Glazier.  We're working on getting her the NCS, and I refilled the oxycodone at the visit she had with me, but am not sure you intended to refill it this long.  Please advise.

## 2012-01-11 NOTE — Telephone Encounter (Signed)
Tramadol 50 q8h prn #21

## 2012-01-11 NOTE — Telephone Encounter (Signed)
Do you want to try an alternative?

## 2012-01-14 NOTE — Telephone Encounter (Signed)
Called patient to advise. Left message for her on voice mail.

## 2012-01-17 ENCOUNTER — Emergency Department (HOSPITAL_COMMUNITY): Payer: Managed Care, Other (non HMO)

## 2012-01-17 ENCOUNTER — Emergency Department (HOSPITAL_COMMUNITY)
Admission: EM | Admit: 2012-01-17 | Discharge: 2012-01-18 | Disposition: A | Discharge status: Home / Self Care | Payer: Managed Care, Other (non HMO) | Attending: Emergency Medicine | Admitting: Emergency Medicine

## 2012-01-17 ENCOUNTER — Encounter (HOSPITAL_COMMUNITY): Payer: Self-pay | Admitting: *Deleted

## 2012-01-17 ENCOUNTER — Other Ambulatory Visit: Payer: Self-pay | Admitting: Physician Assistant

## 2012-01-17 DIAGNOSIS — E785 Hyperlipidemia, unspecified: Secondary | ICD-10-CM | POA: Insufficient documentation

## 2012-01-17 DIAGNOSIS — Z8739 Personal history of other diseases of the musculoskeletal system and connective tissue: Secondary | ICD-10-CM | POA: Insufficient documentation

## 2012-01-17 DIAGNOSIS — Z8719 Personal history of other diseases of the digestive system: Secondary | ICD-10-CM | POA: Insufficient documentation

## 2012-01-17 DIAGNOSIS — Z87828 Personal history of other (healed) physical injury and trauma: Secondary | ICD-10-CM | POA: Insufficient documentation

## 2012-01-17 DIAGNOSIS — E1169 Type 2 diabetes mellitus with other specified complication: Secondary | ICD-10-CM | POA: Insufficient documentation

## 2012-01-17 DIAGNOSIS — R739 Hyperglycemia, unspecified: Secondary | ICD-10-CM

## 2012-01-17 DIAGNOSIS — Z794 Long term (current) use of insulin: Secondary | ICD-10-CM | POA: Insufficient documentation

## 2012-01-17 DIAGNOSIS — Z8659 Personal history of other mental and behavioral disorders: Secondary | ICD-10-CM | POA: Insufficient documentation

## 2012-01-17 DIAGNOSIS — J069 Acute upper respiratory infection, unspecified: Secondary | ICD-10-CM

## 2012-01-17 DIAGNOSIS — Z8669 Personal history of other diseases of the nervous system and sense organs: Secondary | ICD-10-CM | POA: Insufficient documentation

## 2012-01-17 LAB — COMPREHENSIVE METABOLIC PANEL
ALT: 17 U/L (ref 0–35)
AST: 23 U/L (ref 0–37)
Albumin: 3.6 g/dL (ref 3.5–5.2)
Alkaline Phosphatase: 106 U/L (ref 39–117)
BUN: 11 mg/dL (ref 6–23)
CO2: 22 mEq/L (ref 19–32)
Calcium: 9.3 mg/dL (ref 8.4–10.5)
Chloride: 94 mEq/L — ABNORMAL LOW (ref 96–112)
Creatinine, Ser: 0.74 mg/dL (ref 0.50–1.10)
GFR calc Af Amer: >90 mL/min (ref >90)
GFR calc non Af Amer: >90 mL/min (ref >90)
Glucose, Bld: 527 mg/dL — ABNORMAL HIGH (ref 70–99)
Potassium: 6 mEq/L — ABNORMAL HIGH (ref 3.5–5.1)
Sodium: 130 mEq/L — ABNORMAL LOW (ref 135–145)
Total Bilirubin: 0.5 mg/dL (ref 0.3–1.2)
Total Protein: 8.4 g/dL — ABNORMAL HIGH (ref 6.0–8.3)

## 2012-01-17 LAB — CBC WITH DIFFERENTIAL/PLATELET
Basophils Absolute: 0.1 10*3/uL (ref 0.0–0.1)
Basophils Relative: 0 % (ref 0–1)
Eosinophils Absolute: 0.1 10*3/uL (ref 0.0–0.7)
Eosinophils Relative: 1 % (ref 0–5)
HCT: 43.8 % (ref 36.0–46.0)
Hemoglobin: 15.6 g/dL — ABNORMAL HIGH (ref 12.0–15.0)
Lymphocytes Relative: 14 % (ref 12–46)
Lymphs Abs: 2.3 10*3/uL (ref 0.7–4.0)
MCH: 33 pg (ref 26.0–34.0)
MCHC: 35.6 g/dL (ref 30.0–36.0)
MCV: 92.6 fL (ref 78.0–100.0)
Monocytes Absolute: 0.9 10*3/uL (ref 0.1–1.0)
Monocytes Relative: 5 % (ref 3–12)
Neutro Abs: 12.6 10*3/uL — ABNORMAL HIGH (ref 1.7–7.7)
Neutrophils Relative %: 79 % — ABNORMAL HIGH (ref 43–77)
Platelets: 413 10*3/uL — ABNORMAL HIGH (ref 150–400)
RBC: 4.73 MIL/uL (ref 3.87–5.11)
RDW: 12 % (ref 11.5–15.5)
WBC: 15.9 10*3/uL — ABNORMAL HIGH (ref 4.0–10.5)

## 2012-01-17 LAB — URINALYSIS, ROUTINE W REFLEX MICROSCOPIC
Bilirubin Urine: NEGATIVE
Glucose, UA: >1000 mg/dL — AB
Hgb urine dipstick: NEGATIVE
Ketones, ur: NEGATIVE mg/dL
Leukocytes, UA: NEGATIVE
Nitrite: NEGATIVE
Protein, ur: NEGATIVE mg/dL
Specific Gravity, Urine: 1.029 (ref 1.005–1.030)
Urobilinogen, UA: 0.2 mg/dL (ref 0.0–1.0)
pH: 5.5 (ref 5.0–8.0)

## 2012-01-17 LAB — URINE MICROSCOPIC-ADD ON

## 2012-01-17 LAB — GLUCOSE, CAPILLARY: Glucose-Capillary: 591 mg/dL (ref 70–99)

## 2012-01-17 LAB — TROPONIN I: Troponin I: <0.3 ng/mL (ref <0.30)

## 2012-01-17 LAB — KETONES, QUALITATIVE

## 2012-01-17 MED ORDER — SODIUM CHLORIDE 0.9 % IV SOLN
Freq: Once | INTRAVENOUS | Status: AC
  Administered 2012-01-18: 01:00:00 via INTRAVENOUS

## 2012-01-17 MED ORDER — INSULIN ASPART 100 UNIT/ML ~~LOC~~ SOLN
10.0000 [IU] | Freq: Once | SUBCUTANEOUS | Status: AC
  Administered 2012-01-17: 10 [IU] via SUBCUTANEOUS
  Filled 2012-01-17: qty 1

## 2012-01-17 MED ORDER — SODIUM CHLORIDE 0.9 % IV BOLUS (SEPSIS)
1000.0000 mL | Freq: Once | INTRAVENOUS | Status: AC
  Administered 2012-01-17: 1000 mL via INTRAVENOUS

## 2012-01-17 MED ORDER — ONDANSETRON HCL 4 MG/2ML IJ SOLN
4.0000 mg | Freq: Once | INTRAMUSCULAR | Status: AC
  Administered 2012-01-17: 4 mg via INTRAVENOUS
  Filled 2012-01-17: qty 2

## 2012-01-17 MED ORDER — SODIUM CHLORIDE 0.9 % IV SOLN
1.0000 g | Freq: Once | INTRAVENOUS | Status: AC
  Administered 2012-01-17: 1 g via INTRAVENOUS
  Filled 2012-01-17: qty 10

## 2012-01-17 NOTE — ED Provider Notes (Signed)
History     CSN: [id number]  Arrival date & time 01/17/12  2036   First MD Initiated Contact with Patient 01/17/12 2304      No chief complaint on file.   (Consider location/radiation/quality/duration/timing/severity/associated sxs/prior treatment) HPINicolle B [name] is a 41 y.o. female history of diabetes for 30 years presents with elevated blood sugars and rapid breathing. She denies any chest pain to me on my interview. She is most concerned that over the course of the evening she has developed rapid and deep breathing that was reminiscent of prior episodes of DKA. Patient has some diffuse abdominal pain as well. She does have a history of lower colic abdominal pain that she says is caused by a fusion of her cervix to latter which she is taking Lupron 4 and is being seen by Dr. Algie Coffer.  She has had troubles controlling her blood sugars over the course of the last week and complains of sneezing and cough that started on Friday and congestion of her nose which started Saturday morning. Sugars yesterday morning were 412 and this morning were 314. She is mildly nauseous and denies any vomiting or diarrhea. She denies any fevers or chills or productive cough. She says "feels like early DKA."     Past Medical History  Diagnosis Date  . Diabetes mellitus   . Fibromyalgia   . Arthritis   . Hyperlipidemia   . Chills   . Abdominal pain   . Nausea & vomiting   . Biliary dyskinesia   . Depression   . Migraine   . Joint pain   . Skin rash     due to medications  . Post traumatic stress disorder (PTSD)   . Sleep trouble   . Major depressive disorder   . Cervical strain   . Closed head injury     Past Surgical History  Procedure Date  . Tonsillectomy 2001  . Lipoma removed   . Knee surgery   . Cesarean section 1997, 1999  . Abdominal hysterectomy 2001  . Tumor removal 2000    lower back  . Eye surgery 2006    laser eye surgery left eye  . Cholecystectomy 02/10/2011   Procedure: LAPAROSCOPIC CHOLECYSTECTOMY WITH INTRAOPERATIVE CHOLANGIOGRAM;  Surgeon: Rulon Abide, DO;  Location: [facility] OR;  Service: General;  Laterality: N/A;    Family History  Problem Relation Age of Onset  . Cancer Father     lymphoma  . Nephrolithiasis Father   . Cancer Maternal Grandmother     colon  . Hypertension Mother   . Heart disease Mother   . Diabetes Brother     History  Substance Use Topics  . Smoking status: Never Smoker   . Smokeless tobacco: Never Used  . Alcohol Use: No    OB History    Grav Para Term Preterm Abortions TAB SAB Ect Mult Living                  Review of Systems At least 10pt or greater review of systems completed and are negative except where specified in the HPI.  Allergies  Meloxicam; Tylenol; Cephalexin; and Ibuprofen  Home Medications   Current Outpatient Rx  Name  Route  Sig  Dispense  Refill  . INSULIN ASPART 100 UNIT/ML [place] SOLN   Subcutaneous   Inject 6-8 Units into the skin 3 (three) times daily before meals. Sliding scale per MD         . INSULIN GLARGINE 100 UNIT/ML  [place] SOLN   Subcutaneous   Inject 26 Units into the skin at bedtime.          Marland Kitchen LEUPROLIDE ACETATE 3.75 MG IM KIT   Intramuscular   Inject 3.75 mg into the muscle every 28 (twenty-eight) days.         Marland Kitchen PRESCRIPTION MEDICATION   Oral   Take 1 tablet by mouth daily. Estrogen pill           BP 138/75  Pulse 82  Temp 97.7 F (36.5 C) (Oral)  Resp 18  SpO2 99%  Physical Exam  Nursing notes reviewed.  Electronic medical record reviewed. VITAL SIGNS:   Filed Vitals:   01/18/12 0019 01/18/12 0212 01/18/12 0337 01/18/12 0519  BP: 192/80 155/87 163/79 138/75  Pulse: 92 97  82  Temp:   98.9 F (37.2 C) 97.7 F (36.5 C)  TempSrc:   Oral Oral  Resp:  15 20 18   SpO2: 99% 100% 99% 99%   CONSTITUTIONAL: Awake, oriented, appears non-toxic HENT: Atraumatic, normocephalic, oral mucosa pink and moist, airway patent. Nares patent with some  mild congestion and erythematous turbinates. Some clear drainage the. External ears normal. EYES: Conjunctiva clear, EOMI, PERRLA NECK: Trachea midline, non-tender, supple CARDIOVASCULAR: Normal heart rate, Normal rhythm, No murmurs, rubs, gallops PULMONARY/CHEST: Clear to auscultation, no rhonchi, wheezes, or rales. Symmetrical breath sounds. Non-tender. ABDOMINAL: Non-distended, soft, obese, mildly tender to palpation in the suprapubic region and diffusely otherwise, no rebound or guarding.  BS normal. NEUROLOGIC: Non-focal, moving all four extremities, no gross sensory or motor deficits. EXTREMITIES: No clubbing, cyanosis, or edema SKIN: Warm, Dry, No erythema, No rash  ED Course  Procedures (including critical care time)  Labs Reviewed  GLUCOSE, CAPILLARY - Abnormal; Notable for the following:    Glucose-Capillary 591 (*)     All other components within normal limits  CBC WITH DIFFERENTIAL - Abnormal; Notable for the following:    WBC 15.9 (*)     Hemoglobin 15.6 (*)     Platelets 413 (*)     Neutrophils Relative 79 (*)     Neutro Abs 12.6 (*)     All other components within normal limits  COMPREHENSIVE METABOLIC PANEL - Abnormal; Notable for the following:    Sodium 130 (*)     Potassium 6.0 (*)     Chloride 94 (*)     Glucose, Bld 527 (*)     Total Protein 8.4 (*)     All other components within normal limits  KETONES, QUALITATIVE - Abnormal; Notable for the following:    Acetone, Bld SMALL (*)     All other components within normal limits  URINALYSIS, ROUTINE W REFLEX MICROSCOPIC - Abnormal; Notable for the following:    Glucose, UA >1000 (*)     All other components within normal limits  GLUCOSE, CAPILLARY - Abnormal; Notable for the following:    Glucose-Capillary 414 (*)     All other components within normal limits  GLUCOSE, CAPILLARY - Abnormal; Notable for the following:    Glucose-Capillary 255 (*)     All other components within normal limits  POCT I-STAT,  CHEM 8 - Abnormal; Notable for the following:    Glucose, Bld 230 (*)     Calcium, Ion 1.06 (*)     Hemoglobin 15.6 (*)     All other components within normal limits  TROPONIN I  URINE MICROSCOPIC-ADD ON   Dg Chest 2 View  01/17/2012  *RADIOLOGY REPORT*  Clinical  Data: Chest pain.  CHEST - 2 VIEW  Comparison: Chest radiograph performed 06/23/2011  Findings: The lungs are well-aerated and clear.  There is no evidence of focal opacification, pleural effusion or pneumothorax.  The heart is normal in size; the mediastinal contour is within normal limits.  No acute osseous abnormalities are seen.  Small nodular densities overlying the left axilla are thought to be outside the patient.  IMPRESSION: No acute cardiopulmonary process seen.   Original Report Authenticated By: Tonia Ghent, M.D.      1. Hyperglycemia   2. Viral upper respiratory illness       MDM  Maureen [name] is a 41 y.o. female presents with elevated blood sugars have symptoms consistent with viral upper respiratory syndrome. No fevers, chest x-ray is clear, clear to auscultation-do not think the patient has pneumonia, UA does not indicate urinary tract infection. Hyperglycemia may be triggered by this viral syndrome, she has no other  sources of infection, she is afebrile and nontoxic. Patient does have mild white count of 15.9, she's also hemoconcentrated with hemoglobin 15.6 and hematocrit 43.8. She is small ketones is not acidotic does not have a gap. She's dehydrated sodium of 1:30 and a potassium slightly elevated at 6.0 however there is some associated moderate hemolysis and no associated EKG changes including no peak T waves. She's given 1 g of calcium gluconate to look and obtain a new potassium. She's given fluids, and some insulin to help reduce her blood sugars.  PT BMP rechecked - BG is 230. VS normalized.  Pt given Rx for insulin and will have her f/u with [facility] clinic for DM1.  I explained the diagnosis and have  given explicit precautions to return to the ER for any other new or worsening symptoms. The patient understands and accepts the medical plan as it's been dictated and I have answered their questions. Discharge instructions concerning home care and prescriptions have been given.  The patient is STABLE and is discharged to home in good condition.       Maureen Skene, MD 01/18/12 2145

## 2012-01-17 NOTE — ED Notes (Signed)
Pt c/o elevated blood sugars x 2 wks; tonight felt like having trouble breathing and chest felt "uncomfortable"; cbg 591

## 2012-01-18 ENCOUNTER — Emergency Department (HOSPITAL_COMMUNITY): Payer: Managed Care, Other (non HMO)

## 2012-01-18 LAB — POCT I-STAT, CHEM 8
BUN: 9 mg/dL (ref 6–23)
Calcium, Ion: 1.06 mmol/L — ABNORMAL LOW (ref 1.12–1.23)
Chloride: 105 mEq/L (ref 96–112)
Creatinine, Ser: 0.8 mg/dL (ref 0.50–1.10)
Glucose, Bld: 230 mg/dL — ABNORMAL HIGH (ref 70–99)
HCT: 46 % (ref 36.0–46.0)
Hemoglobin: 15.6 g/dL — ABNORMAL HIGH (ref 12.0–15.0)
Potassium: 3.7 mEq/L (ref 3.5–5.1)
Sodium: 138 mEq/L (ref 135–145)
TCO2: 23 mmol/L (ref 0–100)

## 2012-01-18 LAB — GLUCOSE, CAPILLARY
Glucose-Capillary: 255 mg/dL — ABNORMAL HIGH (ref 70–99)
Glucose-Capillary: 414 mg/dL — ABNORMAL HIGH (ref 70–99)

## 2012-01-18 MED ORDER — PROMETHAZINE HCL 25 MG/ML IJ SOLN
25.0000 mg | Freq: Once | INTRAMUSCULAR | Status: AC
  Administered 2012-01-18: 25 mg via INTRAVENOUS
  Filled 2012-01-18: qty 1

## 2012-01-18 MED ORDER — INSULIN ASPART 100 UNIT/ML ~~LOC~~ SOLN
6.0000 [IU] | Freq: Three times a day (TID) | SUBCUTANEOUS | Status: DC
Start: 2012-01-18 — End: 2012-02-19

## 2012-01-18 MED ORDER — HYDROMORPHONE HCL PF 1 MG/ML IJ SOLN
1.0000 mg | Freq: Once | INTRAMUSCULAR | Status: AC
  Administered 2012-01-18: 1 mg via INTRAVENOUS
  Filled 2012-01-18: qty 1

## 2012-01-18 MED ORDER — IOHEXOL 300 MG/ML  SOLN
100.0000 mL | Freq: Once | INTRAMUSCULAR | Status: AC | PRN
  Administered 2012-01-18: 100 mL via INTRAVENOUS

## 2012-01-18 MED ORDER — HYDROCODONE-ACETAMINOPHEN 5-325 MG PO TABS: 1.0000 | ORAL_TABLET | Freq: Four times a day (QID) | ORAL | Status: DC | PRN

## 2012-01-18 MED ORDER — INSULIN GLARGINE 100 UNIT/ML ~~LOC~~ SOLN: 26.0000 [IU] | Freq: Every day | SUBCUTANEOUS | Status: DC

## 2012-01-18 NOTE — ED Notes (Signed)
CT notified that pt has finished contrast.  

## 2012-01-18 NOTE — ED Notes (Signed)
Patient returned from CT

## 2012-02-01 ENCOUNTER — Telehealth: Payer: Self-pay

## 2012-02-01 ENCOUNTER — Ambulatory Visit (INDEPENDENT_AMBULATORY_CARE_PROVIDER_SITE_OTHER): Payer: Managed Care, Other (non HMO) | Admitting: Family Medicine

## 2012-02-01 VITALS — BP 140/82 | HR 96 | Temp 98.8°F | Resp 18 | Ht 64.0 in | Wt 192.0 lb

## 2012-02-01 DIAGNOSIS — G8929 Other chronic pain: Secondary | ICD-10-CM

## 2012-02-01 DIAGNOSIS — R82998 Other abnormal findings in urine: Secondary | ICD-10-CM

## 2012-02-01 DIAGNOSIS — R8281 Pyuria: Secondary | ICD-10-CM

## 2012-02-01 DIAGNOSIS — R1011 Right upper quadrant pain: Secondary | ICD-10-CM

## 2012-02-01 LAB — POCT URINALYSIS DIPSTICK
Bilirubin, UA: NEGATIVE
Blood, UA: NEGATIVE
Glucose, UA: 250
Ketones, UA: NEGATIVE
Leukocytes, UA: NEGATIVE
Nitrite, UA: NEGATIVE
Protein, UA: 300
Spec Grav, UA: 1.025
Urobilinogen, UA: 0.2
pH, UA: 7

## 2012-02-01 LAB — POCT UA - MICROSCOPIC ONLY
Casts, Ur, LPF, POC: NEGATIVE
Crystals, Ur, HPF, POC: NEGATIVE
Mucus, UA: NEGATIVE
RBC, urine, microscopic: NEGATIVE
Yeast, UA: NEGATIVE

## 2012-02-01 MED ORDER — TRAMADOL HCL 50 MG PO TABS: 50.0000 mg | ORAL_TABLET | Freq: Three times a day (TID) | ORAL | Status: DC | PRN

## 2012-02-01 MED ORDER — CIPROFLOXACIN HCL 500 MG PO TABS
500.0000 mg | ORAL_TABLET | Freq: Two times a day (BID) | ORAL | Status: DC
Start: 2012-02-01 — End: 2012-02-19

## 2012-02-01 MED ORDER — DICYCLOMINE HCL 10 MG PO CAPS: 10.0000 mg | ORAL_CAPSULE | Freq: Three times a day (TID) | ORAL | Status: DC | PRN

## 2012-02-01 MED ORDER — HYDROCODONE-ACETAMINOPHEN 5-500 MG PO TABS: 1.0000 | ORAL_TABLET | Freq: Four times a day (QID) | ORAL | Status: DC | PRN

## 2012-02-01 NOTE — Telephone Encounter (Signed)
Refill sent in. No further refills without recheck.

## 2012-02-01 NOTE — Progress Notes (Signed)
42 yo with progressive episodes of right sided abdominal pain for 3 months after having had cholecystectomy and supracervical hysterectomy, thought to possibly be adhesions.  She is followed by Dr. Ernestina Penna and will have follow up ultrasound on Wednesday.  She is in school, but the pain is interfering with her studies.    Her pain is made worse by eating.  Sugars have been better since last month's ED visit   Objective:  Tearful, alert with good eye contact Fullness RUQ with some tenderness with deep palpation.  No definite mass or HSM Results for orders placed during the hospital encounter of 01/17/12  GLUCOSE, CAPILLARY      Component Value Range   Glucose-Capillary 591 (*) 70 - 99 mg/dL   Comment 1 Documented in Chart    CBC WITH DIFFERENTIAL      Component Value Range   WBC 15.9 (*) 4.0 - 10.5 K/uL   RBC 4.73  3.87 - 5.11 MIL/uL   Hemoglobin 15.6 (*) 12.0 - 15.0 g/dL   HCT 16.1  09.6 - 04.5 %   MCV 92.6  78.0 - 100.0 fL   MCH 33.0  26.0 - 34.0 pg   MCHC 35.6  30.0 - 36.0 g/dL   RDW 40.9  81.1 - 91.4 %   Platelets 413 (*) 150 - 400 K/uL   Neutrophils Relative 79 (*) 43 - 77 %   Neutro Abs 12.6 (*) 1.7 - 7.7 K/uL   Lymphocytes Relative 14  12 - 46 %   Lymphs Abs 2.3  0.7 - 4.0 K/uL   Monocytes Relative 5  3 - 12 %   Monocytes Absolute 0.9  0.1 - 1.0 K/uL   Eosinophils Relative 1  0 - 5 %   Eosinophils Absolute 0.1  0.0 - 0.7 K/uL   Basophils Relative 0  0 - 1 %   Basophils Absolute 0.1  0.0 - 0.1 K/uL  COMPREHENSIVE METABOLIC PANEL      Component Value Range   Sodium 130 (*) 135 - 145 mEq/L   Potassium 6.0 (*) 3.5 - 5.1 mEq/L   Chloride 94 (*) 96 - 112 mEq/L   CO2 22  19 - 32 mEq/L   Glucose, Bld 527 (*) 70 - 99 mg/dL   BUN 11  6 - 23 mg/dL   Creatinine, Ser 7.82  0.50 - 1.10 mg/dL   Calcium 9.3  8.4 - 95.6 mg/dL   Total Protein 8.4 (*) 6.0 - 8.3 g/dL   Albumin 3.6  3.5 - 5.2 g/dL   AST 23  0 - 37 U/L   ALT 17  0 - 35 U/L   Alkaline Phosphatase 106  39 - 117 U/L   Total Bilirubin 0.5  0.3 - 1.2 mg/dL   GFR calc non Af Amer >90  >90 mL/min   GFR calc Af Amer >90  >90 mL/min  TROPONIN I      Component Value Range   Troponin I <0.30  <0.30 ng/mL  KETONES, QUALITATIVE      Component Value Range   Acetone, Bld SMALL (*) NEGATIVE  URINALYSIS, ROUTINE W REFLEX MICROSCOPIC      Component Value Range   Color, Urine YELLOW  YELLOW   APPearance CLEAR  CLEAR   Specific Gravity, Urine 1.029  1.005 - 1.030   pH 5.5  5.0 - 8.0   Glucose, UA >1000 (*) NEGATIVE mg/dL   Hgb urine dipstick NEGATIVE  NEGATIVE   Bilirubin Urine NEGATIVE  NEGATIVE  Ketones, ur NEGATIVE  NEGATIVE mg/dL   Protein, ur NEGATIVE  NEGATIVE mg/dL   Urobilinogen, UA 0.2  0.0 - 1.0 mg/dL   Nitrite NEGATIVE  NEGATIVE   Leukocytes, UA NEGATIVE  NEGATIVE  URINE MICROSCOPIC-ADD ON      Component Value Range   Squamous Epithelial / LPF RARE  RARE   RBC / HPF 0-2  <3 RBC/hpf  GLUCOSE, CAPILLARY      Component Value Range   Glucose-Capillary 414 (*) 70 - 99 mg/dL   Comment 1 Documented in Chart     Comment 2 Notify RN    GLUCOSE, CAPILLARY      Component Value Range   Glucose-Capillary 255 (*) 70 - 99 mg/dL   Comment 1 Notify RN     Comment 2 Documented in Chart    POCT I-STAT, CHEM 8      Component Value Range   Sodium 138  135 - 145 mEq/L   Potassium 3.7  3.5 - 5.1 mEq/L   Chloride 105  96 - 112 mEq/L   BUN 9  6 - 23 mg/dL   Creatinine, Ser 1.61  0.50 - 1.10 mg/dL   Glucose, Bld 096 (*) 70 - 99 mg/dL   Calcium, Ion 0.45 (*) 1.12 - 1.23 mmol/L   TCO2 23  0 - 100 mmol/L   Hemoglobin 15.6 (*) 12.0 - 15.0 g/dL   HCT 40.9  81.1 - 91.4 %   Results for orders placed in visit on 02/01/12  POCT URINALYSIS DIPSTICK      Component Value Range   Color, UA yellow     Clarity, UA clear     Glucose, UA 250     Bilirubin, UA neg     Ketones, UA neg     Spec Grav, UA 1.025     Blood, UA neg     pH, UA 7.0     Protein, UA 300     Urobilinogen, UA 0.2     Nitrite, UA neg      Leukocytes, UA Negative    POCT UA - MICROSCOPIC ONLY      Component Value Range   WBC, Ur, HPF, POC 8-12     RBC, urine, microscopic neg     Bacteria, U Microscopic trace     Mucus, UA neg     Epithelial cells, urine per micros 0-3     Crystals, Ur, HPF, POC neg     Casts, Ur, LPF, POC neg     Yeast, UA neg       Assessment: Chronic recurrent abdominal pain, possibly related to fatty liver or adhesions.  Plan: Followup ultrasound in 2 today's, pain medicines for now I will cover the pyuria with cipro and order a urine culture as well

## 2012-02-01 NOTE — Telephone Encounter (Signed)
Called patient she is asking about her abdominal adhesions and if this can be causing her pain on her right side, I have advised her not likely to cause arm pain, she advised the pain is her entire right side. She states Tramadol not much relief, I have advised her the Oxycodone can no longer be refilled, so she is asking for the Tramadol, please advise.

## 2012-02-01 NOTE — Telephone Encounter (Signed)
She has been on several different pain medications recently from several different prescribes. Which medication is she requesting?

## 2012-02-01 NOTE — Telephone Encounter (Signed)
Pended please advise.  

## 2012-02-01 NOTE — Telephone Encounter (Signed)
Pt is needing a refill on pain medication the neuro referral has not been scheduled as of yet  Best number [phone]

## 2012-02-02 NOTE — Telephone Encounter (Signed)
Patient advised, she did come in and saw Dr Milus Glazier last pm.

## 2012-02-02 NOTE — Telephone Encounter (Signed)
Thanks, called patient left message, when we spoke yesterday she states she does plan to follow up with the Neuro surgeon about this. Will advise.

## 2012-02-04 LAB — URINE CULTURE
Colony Count: NO GROWTH
Organism ID, Bacteria: NO GROWTH

## 2012-02-19 ENCOUNTER — Encounter (HOSPITAL_COMMUNITY): Payer: Self-pay | Admitting: Pharmacist

## 2012-02-22 ENCOUNTER — Other Ambulatory Visit: Payer: Self-pay | Admitting: Obstetrics

## 2012-02-25 ENCOUNTER — Encounter (HOSPITAL_COMMUNITY): Payer: Self-pay

## 2012-02-25 ENCOUNTER — Inpatient Hospital Stay (HOSPITAL_COMMUNITY): Admission: RE | Admit: 2012-02-25 | Payer: Managed Care, Other (non HMO) | Source: Ambulatory Visit

## 2012-02-25 ENCOUNTER — Encounter (HOSPITAL_COMMUNITY)
Admission: RE | Admit: 2012-02-25 | Discharge: 2012-02-25 | Disposition: A | Discharge status: Home / Self Care | Payer: Managed Care, Other (non HMO) | Source: Ambulatory Visit | Attending: Obstetrics | Admitting: Obstetrics

## 2012-02-25 LAB — CBC
HCT: 45.5 % (ref 36.0–46.0)
Hemoglobin: 15.6 g/dL — ABNORMAL HIGH (ref 12.0–15.0)
MCH: 32.2 pg (ref 26.0–34.0)
MCHC: 34.3 g/dL (ref 30.0–36.0)
MCV: 93.8 fL (ref 78.0–100.0)
Platelets: 395 10*3/uL (ref 150–400)
RBC: 4.85 MIL/uL (ref 3.87–5.11)
RDW: 12.4 % (ref 11.5–15.5)
WBC: 13.3 10*3/uL — ABNORMAL HIGH (ref 4.0–10.5)

## 2012-02-25 LAB — BASIC METABOLIC PANEL
BUN: 9 mg/dL (ref 6–23)
CO2: 28 mEq/L (ref 19–32)
Calcium: 9.6 mg/dL (ref 8.4–10.5)
Chloride: 97 mEq/L (ref 96–112)
Creatinine, Ser: 0.82 mg/dL (ref 0.50–1.10)
GFR calc Af Amer: >90 mL/min (ref >90)
GFR calc non Af Amer: 89 mL/min — ABNORMAL LOW (ref >90)
Glucose, Bld: 51 mg/dL — ABNORMAL LOW (ref 70–99)
Potassium: 3.3 mEq/L — ABNORMAL LOW (ref 3.5–5.1)
Sodium: 137 mEq/L (ref 135–145)

## 2012-02-25 LAB — SURGICAL PCR SCREEN
MRSA, PCR: NEGATIVE
Staphylococcus aureus: NEGATIVE

## 2012-02-25 NOTE — Patient Instructions (Addendum)
[name]  02/25/2012   Your procedure is scheduled on:  03/04/12  Enter through the Main Entrance of [facility] at 6 AM.  Pick up the phone at the desk and dial [phone].   Call this number if you have problems the morning of surgery: [phone]   Remember:   Do not eat food:After Midnight.  Do not drink clear liquids: After Midnight.  Take these medicines the morning of surgery with A SIP OF WATER: NA  Use 1/2 bedtime dosage of Insulin and none the AM of surgery.   Do not wear jewelry, make-up or nail polish.  Do not wear lotions, powders, or perfumes. You may wear deodorant.  Do not shave 48 hours prior to surgery.  Do not bring valuables to the hospital.  Contacts, dentures or bridgework may not be worn into surgery.  Leave suitcase in the car. After surgery it may be brought to your room.  For patients admitted to the hospital, checkout time is 11:00 AM the day of discharge.   Patients discharged the day of surgery will not be allowed to drive home.  Name and phone number of your driver: NA  Special Instructions: Shower using CHG 2 nights before surgery and the night before surgery.  If you shower the day of surgery use CHG.  Use special wash - you have one bottle of CHG for all showers.  You should use approximately 1/3 of the bottle for each shower.   Please read over the following fact sheets that you were given: MRSA Information

## 2012-02-26 LAB — HEMOGLOBIN A1C
Hgb A1c MFr Bld: 9.1 % — ABNORMAL HIGH (ref <5.7)
Mean Plasma Glucose: 214 mg/dL — ABNORMAL HIGH (ref <117)

## 2012-03-04 ENCOUNTER — Encounter (HOSPITAL_COMMUNITY): Payer: Self-pay | Admitting: Anesthesiology

## 2012-03-04 ENCOUNTER — Encounter (HOSPITAL_COMMUNITY): Payer: Self-pay

## 2012-03-04 ENCOUNTER — Ambulatory Visit (HOSPITAL_COMMUNITY)
Admission: RE | Admit: 2012-03-04 | Discharge: 2012-03-04 | Disposition: A | Discharge status: Home / Self Care | Payer: Managed Care, Other (non HMO) | Source: Ambulatory Visit | Attending: Obstetrics | Admitting: Obstetrics

## 2012-03-04 ENCOUNTER — Ambulatory Visit (HOSPITAL_COMMUNITY): Payer: Managed Care, Other (non HMO)

## 2012-03-04 ENCOUNTER — Encounter (HOSPITAL_COMMUNITY)
Admission: RE | Disposition: A | Discharge status: Home / Self Care | Payer: Self-pay | Source: Ambulatory Visit | Attending: Obstetrics

## 2012-03-04 DIAGNOSIS — N736 Female pelvic peritoneal adhesions (postinfective): Secondary | ICD-10-CM | POA: Insufficient documentation

## 2012-03-04 DIAGNOSIS — N949 Unspecified condition associated with female genital organs and menstrual cycle: Secondary | ICD-10-CM | POA: Insufficient documentation

## 2012-03-04 DIAGNOSIS — Z90710 Acquired absence of both cervix and uterus: Secondary | ICD-10-CM | POA: Insufficient documentation

## 2012-03-04 HISTORY — PX: OTHER SURGICAL HISTORY: SHX169

## 2012-03-04 HISTORY — PX: ROBOTIC ASSISTED LAPAROSCOPIC LYSIS OF ADHESION: SHX6080

## 2012-03-04 LAB — GLUCOSE, CAPILLARY
Glucose-Capillary: 76 mg/dL (ref 70–99)
Glucose-Capillary: 61 mg/dL — ABNORMAL LOW (ref 70–99)
Glucose-Capillary: 208 mg/dL — ABNORMAL HIGH (ref 70–99)
Glucose-Capillary: 231 mg/dL — ABNORMAL HIGH (ref 70–99)
Glucose-Capillary: 233 mg/dL — ABNORMAL HIGH (ref 70–99)
Glucose-Capillary: 253 mg/dL — ABNORMAL HIGH (ref 70–99)

## 2012-03-04 LAB — ABO/RH: ABO/RH(D): B POS

## 2012-03-04 LAB — TYPE AND SCREEN
ABO/RH(D): B POS
Antibody Screen: NEGATIVE

## 2012-03-04 SURGERY — ROBOTIC ASSISTED LAPAROSCOPIC LYSIS OF ADHESION
Anesthesia: General | Wound class: Clean Contaminated

## 2012-03-04 MED ORDER — FENTANYL CITRATE 0.05 MG/ML IJ SOLN
INTRAMUSCULAR | Status: DC | PRN
  Administered 2012-03-04 (×2): 50 ug via INTRAVENOUS
  Administered 2012-03-04: 100 ug via INTRAVENOUS
  Administered 2012-03-04: 50 ug via INTRAVENOUS

## 2012-03-04 MED ORDER — ONDANSETRON HCL 4 MG/2ML IJ SOLN
INTRAMUSCULAR | Status: AC
  Filled 2012-03-04: qty 2

## 2012-03-04 MED ORDER — CLINDAMYCIN PHOSPHATE 900 MG/50ML IV SOLN: 900.0000 mg | Freq: Once | INTRAVENOUS | Status: DC

## 2012-03-04 MED ORDER — LACTATED RINGERS IV SOLN
INTRAVENOUS | Status: DC
  Administered 2012-03-04 (×2): via INTRAVENOUS

## 2012-03-04 MED ORDER — OXYCODONE-ACETAMINOPHEN 5-325 MG PO TABS: 2.0000 | ORAL_TABLET | ORAL | Status: DC | PRN

## 2012-03-04 MED ORDER — CEFAZOLIN SODIUM-DEXTROSE 2-3 GM-% IV SOLR: 2.0000 g | INTRAVENOUS | Status: DC

## 2012-03-04 MED ORDER — PHENYLEPHRINE 40 MCG/ML (10ML) SYRINGE FOR IV PUSH (FOR BLOOD PRESSURE SUPPORT)
PREFILLED_SYRINGE | INTRAVENOUS | Status: AC
  Filled 2012-03-04: qty 5

## 2012-03-04 MED ORDER — PROPOFOL 10 MG/ML IV EMUL
INTRAVENOUS | Status: AC
  Filled 2012-03-04: qty 20

## 2012-03-04 MED ORDER — FENTANYL CITRATE 0.05 MG/ML IJ SOLN
INTRAMUSCULAR | Status: AC
  Filled 2012-03-04: qty 5

## 2012-03-04 MED ORDER — LIDOCAINE HCL (CARDIAC) 20 MG/ML IV SOLN
INTRAVENOUS | Status: DC | PRN
  Administered 2012-03-04: 30 mg via INTRAVENOUS
  Administered 2012-03-04: 50 mg via INTRAVENOUS

## 2012-03-04 MED ORDER — BUPIVACAINE HCL (PF) 0.25 % IJ SOLN
INTRAMUSCULAR | Status: AC
  Filled 2012-03-04: qty 30

## 2012-03-04 MED ORDER — DEXTROSE IN LACTATED RINGERS 5 % IV SOLN
INTRAVENOUS | Status: DC | PRN
  Administered 2012-03-04: 08:00:00 via INTRAVENOUS

## 2012-03-04 MED ORDER — GLYCOPYRROLATE 0.2 MG/ML IJ SOLN
INTRAMUSCULAR | Status: AC
  Filled 2012-03-04: qty 2

## 2012-03-04 MED ORDER — FENTANYL CITRATE 0.05 MG/ML IJ SOLN
25.0000 ug | INTRAMUSCULAR | Status: DC | PRN
  Administered 2012-03-04 (×2): 50 ug via INTRAVENOUS

## 2012-03-04 MED ORDER — PROPOFOL 10 MG/ML IV EMUL
INTRAVENOUS | Status: DC | PRN
  Administered 2012-03-04: 180 mg via INTRAVENOUS

## 2012-03-04 MED ORDER — ROCURONIUM BROMIDE 100 MG/10ML IV SOLN
INTRAVENOUS | Status: DC | PRN
  Administered 2012-03-04: 50 mg via INTRAVENOUS

## 2012-03-04 MED ORDER — EPHEDRINE SULFATE 50 MG/ML IJ SOLN
INTRAMUSCULAR | Status: DC | PRN
  Administered 2012-03-04 (×2): 5 mg via INTRAVENOUS

## 2012-03-04 MED ORDER — GENTAMICIN SULFATE 40 MG/ML IJ SOLN
Freq: Once | INTRAVENOUS | Status: AC
  Administered 2012-03-04 (×2): via INTRAVENOUS
  Filled 2012-03-04: qty 3.18

## 2012-03-04 MED ORDER — ONDANSETRON HCL 4 MG/2ML IJ SOLN
INTRAMUSCULAR | Status: DC | PRN
  Administered 2012-03-04: 4 mg via INTRAVENOUS

## 2012-03-04 MED ORDER — CEFAZOLIN SODIUM-DEXTROSE 2-3 GM-% IV SOLR
INTRAVENOUS | Status: AC
  Filled 2012-03-04: qty 50

## 2012-03-04 MED ORDER — DEXAMETHASONE SODIUM PHOSPHATE 10 MG/ML IJ SOLN
INTRAMUSCULAR | Status: AC
  Filled 2012-03-04: qty 1

## 2012-03-04 MED ORDER — GLYCOPYRROLATE 0.2 MG/ML IJ SOLN
INTRAMUSCULAR | Status: DC | PRN
  Administered 2012-03-04: 0.4 mg via INTRAVENOUS

## 2012-03-04 MED ORDER — FENTANYL CITRATE 0.05 MG/ML IJ SOLN
INTRAMUSCULAR | Status: AC
  Administered 2012-03-04: 50 ug via INTRAVENOUS
  Filled 2012-03-04: qty 2

## 2012-03-04 MED ORDER — LIDOCAINE HCL (CARDIAC) 20 MG/ML IV SOLN
INTRAVENOUS | Status: AC
  Filled 2012-03-04: qty 5

## 2012-03-04 MED ORDER — DEXAMETHASONE SODIUM PHOSPHATE 4 MG/ML IJ SOLN
INTRAMUSCULAR | Status: DC | PRN
  Administered 2012-03-04: 10 mg via INTRAVENOUS

## 2012-03-04 MED ORDER — LACTATED RINGERS IR SOLN
Status: DC | PRN
  Administered 2012-03-04: 3000 mL

## 2012-03-04 MED ORDER — EPHEDRINE 5 MG/ML INJ
INTRAVENOUS | Status: AC
  Filled 2012-03-04: qty 10

## 2012-03-04 MED ORDER — BUPIVACAINE HCL (PF) 0.25 % IJ SOLN
INTRAMUSCULAR | Status: DC | PRN
  Administered 2012-03-04: 18 mL

## 2012-03-04 MED ORDER — ROCURONIUM BROMIDE 50 MG/5ML IV SOLN
INTRAVENOUS | Status: AC
  Filled 2012-03-04: qty 1

## 2012-03-04 MED ORDER — ARTIFICIAL TEARS OP OINT
TOPICAL_OINTMENT | OPHTHALMIC | Status: AC
  Filled 2012-03-04: qty 3.5

## 2012-03-04 MED ORDER — LACTATED RINGERS IV SOLN
INTRAVENOUS | Status: DC | PRN
  Administered 2012-03-04 (×2): via INTRAVENOUS

## 2012-03-04 MED ORDER — MIDAZOLAM HCL 2 MG/2ML IJ SOLN
INTRAMUSCULAR | Status: AC
  Filled 2012-03-04: qty 2

## 2012-03-04 MED ORDER — NEOSTIGMINE METHYLSULFATE 1 MG/ML IJ SOLN
INTRAMUSCULAR | Status: DC | PRN
  Administered 2012-03-04: 3 mg via INTRAVENOUS

## 2012-03-04 MED ORDER — PHENYLEPHRINE HCL 10 MG/ML IJ SOLN
INTRAMUSCULAR | Status: DC | PRN
  Administered 2012-03-04: 80 ug via INTRAVENOUS
  Administered 2012-03-04: 160 ug via INTRAVENOUS
  Administered 2012-03-04: 120 ug via INTRAVENOUS
  Administered 2012-03-04: 40 ug via INTRAVENOUS

## 2012-03-04 MED ORDER — NEOSTIGMINE METHYLSULFATE 1 MG/ML IJ SOLN
INTRAMUSCULAR | Status: AC
  Filled 2012-03-04: qty 1

## 2012-03-04 MED ORDER — MIDAZOLAM HCL 5 MG/5ML IJ SOLN
INTRAMUSCULAR | Status: DC | PRN
  Administered 2012-03-04: 2 mg via INTRAVENOUS

## 2012-03-04 SURGICAL SUPPLY — 66 items
BAG URINE DRAINAGE (UROLOGICAL SUPPLIES) ×3 IMPLANT
BARRIER ADHS 3X4 INTERCEED (GAUZE/BANDAGES/DRESSINGS) ×3 IMPLANT
BLADE LAPAROSCOPIC MORCELL KIT (BLADE) IMPLANT
CATH FOLEY 3WAY  5CC 16FR (CATHETERS) ×1
CATH FOLEY 3WAY 5CC 16FR (CATHETERS) ×2 IMPLANT
CHLORAPREP W/TINT 26ML (MISCELLANEOUS) ×3 IMPLANT
CLOTH BEACON ORANGE TIMEOUT ST (SAFETY) ×3 IMPLANT
CONT PATH 16OZ SNAP LID 3702 (MISCELLANEOUS) ×3 IMPLANT
COVER MAYO STAND STRL (DRAPES) ×3 IMPLANT
COVER TABLE BACK 60X90 (DRAPES) ×6 IMPLANT
COVER TIP SHEARS 8 DVNC (MISCELLANEOUS) ×2 IMPLANT
COVER TIP SHEARS 8MM DA VINCI (MISCELLANEOUS) ×1
DECANTER SPIKE VIAL GLASS SM (MISCELLANEOUS) ×3 IMPLANT
DERMABOND ADVANCED (GAUZE/BANDAGES/DRESSINGS) ×2
DERMABOND ADVANCED .7 DNX12 (GAUZE/BANDAGES/DRESSINGS) ×4 IMPLANT
DRAPE HUG U DISPOSABLE (DRAPE) ×3 IMPLANT
DRAPE LG THREE QUARTER DISP (DRAPES) ×6 IMPLANT
DRAPE WARM FLUID 44X44 (DRAPE) ×3 IMPLANT
ELECT REM PT RETURN 9FT ADLT (ELECTROSURGICAL) ×3
ELECTRODE REM PT RTRN 9FT ADLT (ELECTROSURGICAL) ×2 IMPLANT
EVACUATOR SMOKE 8.L (FILTER) ×3 IMPLANT
GAUZE VASELINE 3X9 (GAUZE/BANDAGES/DRESSINGS) ×3 IMPLANT
GLOVE BIO SURGEON STRL SZ 6.5 (GLOVE) ×12 IMPLANT
GLOVE BIOGEL PI IND STRL 7.0 (GLOVE) ×12 IMPLANT
GLOVE BIOGEL PI INDICATOR 7.0 (GLOVE) ×6
GOWN STRL REIN XL XLG (GOWN DISPOSABLE) ×24 IMPLANT
IV STOPCOCK 4 WAY 40  W/Y SET (IV SOLUTION) ×1
IV STOPCOCK 4 WAY 40 W/Y SET (IV SOLUTION) ×2 IMPLANT
KIT ACCESSORY DA VINCI DISP (KITS) ×1
KIT ACCESSORY DVNC DISP (KITS) ×2 IMPLANT
LEGGING LITHOTOMY PAIR STRL (DRAPES) ×3 IMPLANT
NEEDLE HYPO 22GX1.5 SAFETY (NEEDLE) IMPLANT
NEEDLE INSUFFLATION 120MM (ENDOMECHANICALS) ×3 IMPLANT
OCCLUDER COLPOPNEUMO (BALLOONS) IMPLANT
PACK LAVH (CUSTOM PROCEDURE TRAY) ×3 IMPLANT
PAD PREP 24X48 CUFFED NSTRL (MISCELLANEOUS) ×6 IMPLANT
PLUG CATH AND CAP STER (CATHETERS) ×3 IMPLANT
PROTECTOR NERVE ULNAR (MISCELLANEOUS) ×6 IMPLANT
SET CYSTO W/LG BORE CLAMP LF (SET/KITS/TRAYS/PACK) ×3 IMPLANT
SET IRRIG TUBING LAPAROSCOPIC (IRRIGATION / IRRIGATOR) ×3 IMPLANT
SOLUTION ELECTROLUBE (MISCELLANEOUS) ×3 IMPLANT
SUT VIC AB 0 CT1 27 (SUTURE) ×2
SUT VIC AB 0 CT1 27XBRD ANBCTR (SUTURE) ×4 IMPLANT
SUT VIC AB 0 CT1 27XBRD ANTBC (SUTURE) IMPLANT
SUT VIC AB 0 CT2 27 (SUTURE) IMPLANT
SUT VIC AB 2-0 CT1 27 (SUTURE)
SUT VIC AB 2-0 CT1 TAPERPNT 27 (SUTURE) IMPLANT
SUT VIC AB 4-0 PS2 27 (SUTURE) ×6 IMPLANT
SUT VICRYL 0 27 CT2 27 ABS (SUTURE) IMPLANT
SUT VICRYL 0 UR6 27IN ABS (SUTURE) ×6 IMPLANT
SYR 50ML LL SCALE MARK (SYRINGE) ×3 IMPLANT
SYSTEM CONVERTIBLE TROCAR (TROCAR) IMPLANT
TIP UTERINE 5.1X6CM LAV DISP (MISCELLANEOUS) IMPLANT
TIP UTERINE 6.7X10CM GRN DISP (MISCELLANEOUS) IMPLANT
TIP UTERINE 6.7X6CM WHT DISP (MISCELLANEOUS) IMPLANT
TIP UTERINE 6.7X8CM BLUE DISP (MISCELLANEOUS) IMPLANT
TOWEL OR 17X24 6PK STRL BLUE (TOWEL DISPOSABLE) ×9 IMPLANT
TROCAR 12M 150ML BLUNT (TROCAR) ×3 IMPLANT
TROCAR DISP BLADELESS 8 DVNC (TROCAR) ×2 IMPLANT
TROCAR DISP BLADELESS 8MM (TROCAR) ×1
TROCAR OPTI TIP 12M 100M (ENDOMECHANICALS) ×3 IMPLANT
TROCAR XCEL 12X100 BLDLESS (ENDOMECHANICALS) IMPLANT
TROCAR XCEL NON-BLD 5MMX100MML (ENDOMECHANICALS) ×3 IMPLANT
TUBING FILTER THERMOFLATOR (ELECTROSURGICAL) ×3 IMPLANT
WARMER LAPAROSCOPE (MISCELLANEOUS) ×3 IMPLANT
WATER STERILE IRR 1000ML POUR (IV SOLUTION) ×9 IMPLANT

## 2012-03-04 NOTE — Brief Op Note (Signed)
03/04/2012  11:12 AM  PATIENT:  Coralie Carpen  42 y.o. female  PRE-OPERATIVE DIAGNOSIS:  Pelvic Pain; Pelvic Adhesions  POST-OPERATIVE DIAGNOSIS:  Pelvic Pain; Pelvic Adhesions   PROCEDURE:  Procedure(s): ROBOTIC ASSISTED LAPAROSCOPIC LYSIS OF EXTENSIVE ADHESIONS (N/A)  SURGEON:  Surgeon(s) and Role:    Tresa Endo A. Ernestina Penna, MD - Primary    * Genia Del, MD - Assisting  PHYSICIAN ASSISTANT:   ASSISTANTSSeymour Bars, MA   ANESTHESIA:   general  EBL:  Total I/O In: 1100 [I.V.:1100] Out: 300 [Urine:300]  BLOOD ADMINISTERED:none  DRAINS: none   LOCAL MEDICATIONS USED:  MARCAINE     SPECIMEN:  No Specimen  DISPOSITION OF SPECIMEN:  N/A  COUNTS:  YES  TOURNIQUET:  * No tourniquets in log *  DICTATION: .Note written in EPIC  PLAN OF CARE: Discharge to home after PACU  PATIENT DISPOSITION:  PACU - hemodynamically stable.   Delay start of Pharmacological VTE agent (>24hrs) due to surgical blood loss or risk of bleeding: yes

## 2012-03-04 NOTE — Anesthesia Preprocedure Evaluation (Addendum)
Anesthesia Evaluation  Patient identified by MRN, date of birth, ID band Patient awake    Reviewed: Allergy & Precautions, H&P , Patient's Chart, lab work & pertinent test results, reviewed documented beta blocker date and time   Airway Mallampati: II TM Distance: >3 FB Neck ROM: full    Dental no notable dental hx.    Pulmonary  breath sounds clear to auscultation  Pulmonary exam normal       Cardiovascular Rhythm:regular Rate:Normal     Neuro/Psych    GI/Hepatic   Endo/Other  diabetes, Well Controlled  Renal/GU      Musculoskeletal   Abdominal   Peds  Hematology   Anesthesia Other Findings BS 78 this am  Reproductive/Obstetrics                          Anesthesia Physical Anesthesia Plan  ASA: III  Anesthesia Plan: General   Post-op Pain Management:    Induction: Intravenous  Airway Management Planned: Oral ETT  Additional Equipment:   Intra-op Plan:   Post-operative Plan:   Informed Consent: I have reviewed the patients History and Physical, chart, labs and discussed the procedure including the risks, benefits and alternatives for the proposed anesthesia with the patient or authorized representative who has indicated his/her understanding and acceptance.   Dental Advisory Given and Dental advisory given  Plan Discussed with: CRNA and Surgeon  Anesthesia Plan Comments: (  Discussed  general anesthesia, including possible nausea, instrumentation of airway, sore throat,pulmonary aspiration, etc. I asked if the were any outstanding questions, or  concerns before we proceeded. )        Anesthesia Quick Evaluation

## 2012-03-04 NOTE — Anesthesia Postprocedure Evaluation (Signed)
  Anesthesia Post-op Note  Patient: Maureen [name]  Procedure(s) Performed: Procedure(s): ROBOTIC ASSISTED LAPAROSCOPIC LYSIS OF EXTENSIVE ADHESIONS (N/A)  Patient is awake and responsive. Pain and nausea are reasonably well controlled. Vital signs are stable and clinically acceptable. Oxygen saturation is clinically acceptable. There are no apparent anesthetic complications at this time. Patient is ready for discharge.

## 2012-03-04 NOTE — H&P (Signed)
See scanned docs: pelvic pain, DM (A1C 9.1) with continued pelvic pain. S/p TLH/ RSO. Multiple other pelvic surgeries and known pelvic adhesions. For diagnostic l'scope, LOA, possible trachelectomy. Possible LSO but attempting to keep L ovary intact. Multiple risks reviewed with patient.    Maureen [name] A. 03/04/2012 7:50 AM

## 2012-03-04 NOTE — Op Note (Signed)
03/04/2012  11:12 AM  PATIENT:  Maureen [name]  42 y.o. female  PRE-OPERATIVE DIAGNOSIS:  Pelvic Pain; Pelvic Adhesions  POST-OPERATIVE DIAGNOSIS:  Pelvic Pain; Pelvic Adhesions   PROCEDURE:  Procedure(s): ROBOTIC ASSISTED LAPAROSCOPIC LYSIS OF EXTENSIVE ADHESIONS (N/A)  SURGEON:  Surgeon(s) and Role:    Tresa Endo A. Ernestina Penna, MD - Primary    * Genia Del, MD - Assisting  PHYSICIAN ASSISTANT:   ASSISTANTSSeymour Bars, MA   ANESTHESIA:   general  EBL:  Total I/O In: 1100 [I.V.:1100] Out: 300 [Urine:300]  BLOOD ADMINISTERED:none  DRAINS: none   LOCAL MEDICATIONS USED:  MARCAINE     SPECIMEN:  No Specimen  DISPOSITION OF SPECIMEN:  N/A  COUNTS:  YES  TOURNIQUET:  * No tourniquets in log *  DICTATION: .Note written in EPIC  PLAN OF CARE: Discharge to home after PACU  PATIENT DISPOSITION:  PACU - hemodynamically stable.   Delay start of Pharmacological VTE agent (>24hrs) due to surgical blood loss or risk of bleeding: yes  Antibiotics: Gentamicin and clindamycin Complications: None  Findings: Normal appendix densely  adhered to the right pelvic sidewall, multiple omental and bowel adhesions to the anterior abdominal wall, absence of uterus, no apparent cervix, absence of right ovary, normal-appearing left ovary, normal left fallopian tube densely adherent to the left pelvic sidewall, bladder densely adherent to small bowel in the midline just at the level of the vaginal cuff.   Indications: This is a 42 year old G5 P2 patient with persistent pelvic pain. Patient is status post supracervical hysterectomy and right salpingo-oophorectomy about 7 years ago. Patient was noted to have dense bowel adhesions at that time with inability to remove her cervix. Patient's pain improved postoperatively but had recurrence of pain over the past several years. Patient had her gallbladder out in January of 2013 and had improvement in pain. However sharp increase in patient's pain  in fall of 2014 to the point where the patient has been, narcotic requiring on a daily basis. Patient now admitted for lysis of adhesions with possible trachelectomy.  Procedure: After informed consent and discussion of alternatives for pain control, the patient was taken to the operating room where general anesthesia was initiated without difficulty. She was prepped and draped in normal sterile fashion in the dorsal supine lithotomy position. A three-way Foley catheter was inserted sterilely into the bladder. Rectal probe was placed in the vagina to use for manipulation to help identify vagina from bladder and rectum.  Attention was then turned to the patient's abdomen. 0.5 % marcaine was used prior to all incision.A 10 mm incision was made in the umbilicus and blunt and sharp dissection was done until the fascia was identified. Attempt was made to enter the fascia with the Mayo scissors however the fascia was palpated to be quite thick. Palpation of the surrounding tissues revealed probable omental adhesions. At this time the decision was made to place a left upper quadrant port to help with visualization. An OG tube had been placed previously. Marcaine was used and a 5 mm skin incision was made with the scalpel. The varies needle was inserted through this incision. Proper intra-abdominal placement could not be confirmed with the use of the varies needle. The varies needle was passed 3 separate times and each time we could not confirm proper placement. Attention was then turned back to the umbilicus for entry. With sharp and blunt dissection and eventual identification of the peritoneum this was entered sharply. The [facility] trocar was placed under direct  visualization and pneumoperitoneum was created. The camera was then used to survey the patient's abdomen. Multiple omental adhesions were noted surrounding the [facility] trocar. With Trendelenburg position and pneumoperitoneum a small window was noted in both the  left and right lower quadrants.  8 mm trochars were then placed under direct visualization. About 10 minutes were spent in evaluating the pelvis with findings as above prior to placing the 8 mm trochars. Decision was made to push foward with the case and robotic assistance.   The robot was brought to the patient's side and attached with the right side docking. The robotic instruments were placed under direct visualization until proper placement just over the uterus.  I then went to the robotic console. Brief survey of the patient's abdomen and pelvis revealed  findings as above. I started with the omental and bowel adhesions to the anterior abdominal wall. These were taken down with a combination of sharp dissection with the monopolar scissors. A very limited use of cautery was used at this point. Very dense adhesions were noted on the right pelvic sidewall and these were carefully removed again with a combination of monopolar cautery and scissors. After further dissection it was identified that the appendix was the structure most closely scarred to the pelvic sidewall this was taken down sharply. Good hemostasis was noted. The bladder was noted to be densely adherent to the bowel just at the level of the vaginal cuff using a combination of traction and backfilling the bladder I was able to bluntly and sharply dissected this plane this dissection took approximately one hour. In order to add additional traction in this process the decision was made to place an assistive port. Additional omental adhesions needed to be taken down on the anterior abdominal wall. Once these adhesions were free and we could find another window to advance the trocar this was done under direct visualization. With the use of the assistant port and the [place] suction irrigator additional hydrodissection of the plane between the bladder and the bowel was created. This greatly helped to keep a safe plane. Using the rectal probe in the  vagina we were able to elevate the vaginal cuff and see a free and clear plane by the end of the case between the bladder the vaginal cuff and the bowel. The left ovary was also hidden behind palate lesions. These were taken down. The ovary was identified and found to be normal in appearance. The fallopian tube was scarred quite densely to the pelvic sidewall. It was difficult to distinguish the left IP from the scarred left fallopian tube and the decision was made to leave the fallopian tube intact as I did not want to jeopardize the left ovary. Once all pelvic adhesions were taken down hemostasis was assured the robotic portion of the case was completed. Total dissection time was about 2 hours and 15 minutes. Using standard laparoscopy Interceed was placed over the vaginal cuff and on the right pelvic sidewall where the appendix was scarred.  All instruments were removed under direct visualization. Pneumoperitoneum was removed. The umbilical incision was evaluated in a figure-of-eight suture of 0 Vicryl is placed on the fascial edges. The remainder of the skin sites were closed with 4-0 Vicryl and Dermabond. A Foley catheter was removed.  Of note the patient did have the blood sugar into the 60s early in the case. D5 was given IV and the patient's blood sugar subsequently increased to the low 200s anesthesia was monitoring her blood sugars throughout  the case and plans for surveillance in the postoperative area.  The patient was taken to the recovery room in stable condition. Sponge lap and needle counts are correct x3 and patient was taken to the recovery room in a stable condition.  Nichoals Heyde A. 03/04/2012 11:39 AM

## 2012-03-04 NOTE — Transfer of Care (Signed)
Immediate Anesthesia Transfer of Care Note  Patient: Maureen [name]  Procedure(s) Performed: Procedure(s): ROBOTIC ASSISTED LAPAROSCOPIC LYSIS OF EXTENSIVE ADHESIONS (N/A)  Patient Location: PACU  Anesthesia Type:General  Level of Consciousness: awake and sedated  Airway & Oxygen Therapy: Patient Spontanous Breathing and Patient connected to nasal cannula oxygen  Post-op Assessment: Report given to PACU RN  Post vital signs: Reviewed and stable  Complications: No apparent anesthesia complications

## 2012-03-07 ENCOUNTER — Encounter (HOSPITAL_COMMUNITY): Payer: Self-pay | Admitting: Obstetrics

## 2012-03-30 ENCOUNTER — Ambulatory Visit (INDEPENDENT_AMBULATORY_CARE_PROVIDER_SITE_OTHER): Payer: Managed Care, Other (non HMO) | Admitting: Family Medicine

## 2012-03-30 VITALS — BP 148/68 | HR 90 | Temp 98.5°F | Resp 16 | Ht 64.0 in | Wt 187.6 lb

## 2012-03-30 DIAGNOSIS — R1011 Right upper quadrant pain: Secondary | ICD-10-CM

## 2012-03-30 DIAGNOSIS — Z760 Encounter for issue of repeat prescription: Secondary | ICD-10-CM

## 2012-03-30 MED ORDER — HYDROCODONE-ACETAMINOPHEN 5-325 MG PO TABS: 1.0000 | ORAL_TABLET | Freq: Three times a day (TID) | ORAL | Status: DC | PRN

## 2012-03-30 NOTE — Patient Instructions (Signed)
Very nice to meet you I will tell Dr. Elbert Ewings about your visit.  Please make her appointments with the general surgeon. I wished you the best

## 2012-03-30 NOTE — Progress Notes (Signed)
Maureen [name] is here for continued abdominal pain. Maureen [name] is a 42 year old female with a long-standing history of abdominal pain. Maureen [name] has had an multiple different workups including CAT scans and MRIs.. 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 Maureen [name] is scheduled to have a appendectomy at sometime in the near future but unfortunately her office visit with Dr. Darylene Price has been postponed. Maureen [name] is out of her pain medications at this time he would need a refill. Maureen [name] has been on pain medication for quite some time and tries to use them sparingly if possible. Maureen [name] states that the abdominal pain at this present moment seems to be doing relatively well. Maureen [name] unfortunately though when she comes off of pain medications usually has worsening pain. Maureen [name] denies any radiation, denies any nausea vomiting, and is able to tolerate food intake at this time. Please look through past medical history for further detail.  Physical exam Blood pressure 148/68, pulse 90, temperature 98.5 F (36.9 C), temperature source Oral, resp. rate 16, height 5\' 4"  (1.626 m), weight 187 lb 9.6 oz (85.095 kg), SpO2 99.00%.  General: No apparent distress the Maureen [name] is somewhat anxious Abdominal exam: Bowel sounds positive Maureen [name] is minimally tender diffusely. Maureen [name] does have some mild increased pain in the right lower quadrant. Extremities: No Maureen [name] has 5 out of 5 strength and is neurovascularly intact in all extremities..   Assessment Abdominal pain, diagnosis still not definitive.   Plan: I did look into Maureen [name]'s appointment and it was canceled as well as rescheduled. I do feel that Maureen [name] is very genuine and has had sufficient amount workup. Maureen [name] did have a refill of her hydrocodone and was given 40 tablets. Maureen [name] will follow up on an as needed basis here but more importantly with the general surgery to see if a  appendectomy could be curative.

## 2012-03-31 ENCOUNTER — Encounter (INDEPENDENT_AMBULATORY_CARE_PROVIDER_SITE_OTHER): Payer: Managed Care, Other (non HMO) | Admitting: General Surgery

## 2012-04-13 ENCOUNTER — Ambulatory Visit (INDEPENDENT_AMBULATORY_CARE_PROVIDER_SITE_OTHER): Payer: Managed Care, Other (non HMO) | Admitting: General Surgery

## 2012-04-13 ENCOUNTER — Encounter (INDEPENDENT_AMBULATORY_CARE_PROVIDER_SITE_OTHER): Payer: Self-pay | Admitting: General Surgery

## 2012-04-13 VITALS — BP 124/85 | HR 70 | Temp 98.1°F | Resp 16 | Ht 64.0 in | Wt 188.0 lb

## 2012-04-13 DIAGNOSIS — R109 Unspecified abdominal pain: Secondary | ICD-10-CM

## 2012-04-13 NOTE — Progress Notes (Signed)
Subjective:     Patient ID: Maureen [name], female   DOB: 1970-02-18, 42 y.o.   MRN: [id number]  HPI This patient comes back for evaluation of right-sided abdominal pain. This has been chronic in nature and didn't present for several years now. Year ago we took out her gallbladder with the hope that this would help with her symptoms but she had relief of her symptoms for about 6 months now she continues to have this. In February of sheath underwent diagnostic laparoscopy by Dr. Algie Coffer for evaluation of and she was noted to have some adhesions as well as a normal-appearing appendix which was adhered to the pelvic sidewall and she was referred for possible appendectomy. Currently she takes OxyContin for this chronic pain.  Review of Systems     Objective:   Physical Exam No distress and nontoxic-appearing sitting completely on the bed She has some suprapubic and right upper quadrant abdominal pain 2 palpation but this appears very mild. Her incisions are well-healed and there is no sign of hernia    Assessment:     Chronic abdominal pain I'm not certain what is causing her abdominal pain. I do not think that her appendix or adhesions would be causing this pain.  We discussed the possibility for diagnostic laparoscopy with adhesiolysis and appendectomy but again I stressed the fact that I do not feel very confident that this would make any difference in her symptoms. I would not recommend this route. She says that Dr. Algie Coffer has some video or pictures from her surgery showing an abnormal appendix and we will try to obtain these pictures although I have reviewed her operative note and she comments on a normal appendix adhered to the pelvic sidewall which may be a normal variant.    Plan:     We will try to obtain the images from Dr. Algie Coffer and we will discuss the results after this. Again, unless these photos show some gross abnormalities, I would not recommend any further surgical  intervention or appendectomy

## 2012-04-24 ENCOUNTER — Emergency Department (HOSPITAL_COMMUNITY)
Admission: EM | Admit: 2012-04-24 | Discharge: 2012-04-25 | Disposition: A | Discharge status: Home / Self Care | Payer: Managed Care, Other (non HMO) | Attending: Emergency Medicine | Admitting: Emergency Medicine

## 2012-04-24 DIAGNOSIS — Z8739 Personal history of other diseases of the musculoskeletal system and connective tissue: Secondary | ICD-10-CM | POA: Insufficient documentation

## 2012-04-24 DIAGNOSIS — Z87828 Personal history of other (healed) physical injury and trauma: Secondary | ICD-10-CM | POA: Insufficient documentation

## 2012-04-24 DIAGNOSIS — Z794 Long term (current) use of insulin: Secondary | ICD-10-CM | POA: Insufficient documentation

## 2012-04-24 DIAGNOSIS — Z8679 Personal history of other diseases of the circulatory system: Secondary | ICD-10-CM | POA: Insufficient documentation

## 2012-04-24 DIAGNOSIS — G8929 Other chronic pain: Secondary | ICD-10-CM

## 2012-04-24 DIAGNOSIS — E119 Type 2 diabetes mellitus without complications: Secondary | ICD-10-CM | POA: Insufficient documentation

## 2012-04-24 DIAGNOSIS — Z8639 Personal history of other endocrine, nutritional and metabolic disease: Secondary | ICD-10-CM | POA: Insufficient documentation

## 2012-04-24 DIAGNOSIS — R109 Unspecified abdominal pain: Secondary | ICD-10-CM | POA: Insufficient documentation

## 2012-04-24 DIAGNOSIS — Z8719 Personal history of other diseases of the digestive system: Secondary | ICD-10-CM | POA: Insufficient documentation

## 2012-04-24 DIAGNOSIS — Z862 Personal history of diseases of the blood and blood-forming organs and certain disorders involving the immune mechanism: Secondary | ICD-10-CM | POA: Insufficient documentation

## 2012-04-24 DIAGNOSIS — Z8659 Personal history of other mental and behavioral disorders: Secondary | ICD-10-CM | POA: Insufficient documentation

## 2012-04-24 DIAGNOSIS — M129 Arthropathy, unspecified: Secondary | ICD-10-CM | POA: Insufficient documentation

## 2012-04-24 DIAGNOSIS — Z9889 Other specified postprocedural states: Secondary | ICD-10-CM | POA: Insufficient documentation

## 2012-04-24 DIAGNOSIS — Z90710 Acquired absence of both cervix and uterus: Secondary | ICD-10-CM | POA: Insufficient documentation

## 2012-04-24 DIAGNOSIS — Z872 Personal history of diseases of the skin and subcutaneous tissue: Secondary | ICD-10-CM | POA: Insufficient documentation

## 2012-04-24 DIAGNOSIS — Z9089 Acquired absence of other organs: Secondary | ICD-10-CM | POA: Insufficient documentation

## 2012-04-24 LAB — CBC WITH DIFFERENTIAL/PLATELET
Basophils Absolute: 0 10*3/uL (ref 0.0–0.1)
Basophils Relative: 0 % (ref 0–1)
Eosinophils Absolute: 0.1 10*3/uL (ref 0.0–0.7)
Eosinophils Relative: 1 % (ref 0–5)
HCT: 41.6 % (ref 36.0–46.0)
Hemoglobin: 15 g/dL (ref 12.0–15.0)
Lymphocytes Relative: 24 % (ref 12–46)
Lymphs Abs: 3 10*3/uL (ref 0.7–4.0)
MCH: 33.2 pg (ref 26.0–34.0)
MCHC: 36.1 g/dL — ABNORMAL HIGH (ref 30.0–36.0)
MCV: 92 fL (ref 78.0–100.0)
Monocytes Absolute: 0.9 10*3/uL (ref 0.1–1.0)
Monocytes Relative: 7 % (ref 3–12)
Neutro Abs: 8.4 10*3/uL — ABNORMAL HIGH (ref 1.7–7.7)
Neutrophils Relative %: 68 % (ref 43–77)
Platelets: 483 10*3/uL — ABNORMAL HIGH (ref 150–400)
RBC: 4.52 MIL/uL (ref 3.87–5.11)
RDW: 12.1 % (ref 11.5–15.5)
WBC: 12.4 10*3/uL — ABNORMAL HIGH (ref 4.0–10.5)

## 2012-04-24 LAB — COMPREHENSIVE METABOLIC PANEL
ALT: 17 U/L (ref 0–35)
AST: 39 U/L — ABNORMAL HIGH (ref 0–37)
Albumin: 3.5 g/dL (ref 3.5–5.2)
Alkaline Phosphatase: 118 U/L — ABNORMAL HIGH (ref 39–117)
BUN: 9 mg/dL (ref 6–23)
CO2: 28 mEq/L (ref 19–32)
Calcium: 9.6 mg/dL (ref 8.4–10.5)
Chloride: 98 mEq/L (ref 96–112)
Creatinine, Ser: 0.8 mg/dL (ref 0.50–1.10)
GFR calc Af Amer: >90 mL/min (ref >90)
GFR calc non Af Amer: >90 mL/min (ref >90)
Glucose, Bld: 219 mg/dL — ABNORMAL HIGH (ref 70–99)
Potassium: 5 mEq/L (ref 3.5–5.1)
Sodium: 137 mEq/L (ref 135–145)
Total Bilirubin: 0.3 mg/dL (ref 0.3–1.2)
Total Protein: 8.1 g/dL (ref 6.0–8.3)

## 2012-04-24 LAB — URINALYSIS, ROUTINE W REFLEX MICROSCOPIC
Bilirubin Urine: NEGATIVE
Glucose, UA: >1000 mg/dL — AB
Ketones, ur: NEGATIVE mg/dL
Leukocytes, UA: NEGATIVE
Nitrite: NEGATIVE
Protein, ur: 100 mg/dL — AB
Specific Gravity, Urine: 1.033 — ABNORMAL HIGH (ref 1.005–1.030)
Urobilinogen, UA: 0.2 mg/dL (ref 0.0–1.0)
pH: 6 (ref 5.0–8.0)

## 2012-04-24 LAB — URINE MICROSCOPIC-ADD ON

## 2012-04-24 LAB — LIPASE, BLOOD: Lipase: 27 U/L (ref 11–59)

## 2012-04-24 MED ORDER — HYDROCODONE-ACETAMINOPHEN 5-325 MG PO TABS: 2.0000 | ORAL_TABLET | ORAL | Status: DC | PRN

## 2012-04-24 MED ORDER — MORPHINE SULFATE 4 MG/ML IJ SOLN
8.0000 mg | Freq: Once | INTRAMUSCULAR | Status: AC
  Administered 2012-04-24: 8 mg via INTRAVENOUS
  Filled 2012-04-24 (×2): qty 1

## 2012-04-24 MED ORDER — SODIUM CHLORIDE 0.9 % IV BOLUS (SEPSIS)
1000.0000 mL | Freq: Once | INTRAVENOUS | Status: AC
  Administered 2012-04-24: 1000 mL via INTRAVENOUS

## 2012-04-24 NOTE — ED Provider Notes (Signed)
History     CSN: [id number]  Arrival date & time 04/24/12  2056   First MD Initiated Contact with Patient 04/24/12 2113      No chief complaint on file.   (Consider location/radiation/quality/duration/timing/severity/associated sxs/prior treatment) The history is provided by the patient.  ZOEJANE GAULIN is a 42 y.o. female history of fibromyalgia, diabetes, chronic abdominal pain,  Adhesions from previous surgery but no history of SBO, here presenting with abdominal pain. She has chronic abdominal pain but got worse today. Says worse on the right side but also has some pain on the left. Denies any vomiting or urinary symptoms or constipation or diarrhea. She had extensive workup with previous normal CTs as well as recent adhesion repair and surgical evaluation and appendectomy not recommended. She came here because she wanted to find out why she is having this pain and is frustrated. She denies fever. She said that she has persistently elevated WBC and ESR with no cause.    Past Medical History  Diagnosis Date  . Diabetes mellitus   . Fibromyalgia   . Arthritis   . Hyperlipidemia   . Chills   . Abdominal pain   . Nausea & vomiting   . Biliary dyskinesia   . Depression   . Migraine   . Joint pain   . Skin rash     due to medications  . Post traumatic stress disorder (PTSD)   . Sleep trouble   . Major depressive disorder   . Cervical strain   . Closed head injury     Past Surgical History  Procedure Laterality Date  . Tonsillectomy  2001  . Lipoma removed    . Knee surgery    . Cesarean section  1997, 1999  . Abdominal hysterectomy  2001  . Tumor removal  2000    lower back  . Eye surgery  2006    laser eye surgery left eye  . Cholecystectomy  02/10/2011    Procedure: LAPAROSCOPIC CHOLECYSTECTOMY WITH INTRAOPERATIVE CHOLANGIOGRAM;  Surgeon: Rulon Abide, DO;  Location: [facility] OR;  Service: General;  Laterality: N/A;  . Robotic assisted laparoscopic lysis of  adhesion N/A 03/04/2012    Procedure: ROBOTIC ASSISTED LAPAROSCOPIC LYSIS OF EXTENSIVE ADHESIONS;  Surgeon: Tresa Endo A. Ernestina Penna, MD;  Location: [facility] ORS;  Service: Gynecology;  Laterality: N/A;    Family History  Problem Relation Age of Onset  . Cancer Father     lymphoma  . Nephrolithiasis Father   . Vascular Disease Father   . Cancer Maternal Grandmother     colon  . Hypertension Mother   . Heart disease Mother   . Cataracts Mother   . Diabetes Brother   . Mental illness Maternal Grandfather   . Cancer Maternal Grandfather   . Heart disease Paternal Grandmother   . Heart disease Paternal Grandfather     History  Substance Use Topics  . Smoking status: Never Smoker   . Smokeless tobacco: Never Used  . Alcohol Use: No    OB History   Grav Para Term Preterm Abortions TAB SAB Ect Mult Living                  Review of Systems  Gastrointestinal: Positive for abdominal pain.  All other systems reviewed and are negative.    Allergies  Meloxicam; Cephalexin; and Ibuprofen  Home Medications   Current Outpatient Rx  Name  Route  Sig  Dispense  Refill  . acetaminophen (TYLENOL) 500 MG tablet  Oral   Take 1,000 mg by mouth every 6 (six) hours as needed for pain.         Marland Kitchen insulin aspart (NOVOLOG) 100 UNIT/ML injection   Subcutaneous   Inject 8 Units into the skin 3 (three) times daily before meals.         . insulin glargine (LANTUS) 100 UNIT/ML injection   Subcutaneous   Inject 28 Units into the skin at bedtime.            There were no vitals taken for this visit.  Physical Exam  Nursing note and vitals reviewed. Constitutional: She is oriented to person, place, and time. She appears well-developed and well-nourished.  HENT:  Head: Normocephalic.  Mouth/Throat: Oropharynx is clear and moist.  Eyes: Conjunctivae are normal. Pupils are equal, round, and reactive to light.  Neck: Normal range of motion. Neck supple.  Cardiovascular: Normal rate, regular  rhythm and normal heart sounds.   Pulmonary/Chest: Effort normal and breath sounds normal. No respiratory distress. She has no wheezes. She has no rales.  Abdominal: Soft. Bowel sounds are normal.  Not distended, nontender. No CVAT   Musculoskeletal: Normal range of motion.  Neurological: She is alert and oriented to person, place, and time.  Skin: Skin is warm and dry.  Psychiatric: She has a normal mood and affect. Her behavior is normal. Judgment and thought content normal.    ED Course  Procedures (including critical care time)  Labs Reviewed  CBC WITH DIFFERENTIAL - Abnormal; Notable for the following:    WBC 12.4 (*)    MCHC 36.1 (*)    Platelets 483 (*)    Neutro Abs 8.4 (*)    All other components within normal limits  COMPREHENSIVE METABOLIC PANEL - Abnormal; Notable for the following:    Glucose, Bld 219 (*)    AST 39 (*)    Alkaline Phosphatase 118 (*)    All other components within normal limits  URINALYSIS, ROUTINE W REFLEX MICROSCOPIC - Abnormal; Notable for the following:    Specific Gravity, Urine 1.033 (*)    Glucose, UA >1000 (*)    Hgb urine dipstick TRACE (*)    Protein, ur 100 (*)    All other components within normal limits  URINE MICROSCOPIC-ADD ON - Abnormal; Notable for the following:    Squamous Epithelial / LPF FEW (*)    All other components within normal limits  LIPASE, BLOOD   No results found.   No diagnosis found.    MDM  IRISHA GRANDMAISON is a 42 y.o. female here with worsening chronic abdominal pain. Will give pain meds and get basic labs. I counseled her that she had many normal previous CTs. She agreed that we should do more pain control first. She agreed that if her labs are at baseline, then she doesn't want another CT and can f/u with her specialists. Her abdomen exam is benign and I think she has worsening of her chronic pain from the adhesions.   11:25 PM Pain improved. I had extensive discussions with her regarding her pain.  She has been refusing to take long acting pain meds in the past. I told her that long acting pain meds is more preferred than short acting ones. Will give some vicodin for break through pain. She will see a pain specialist and her other specialists soon. Return precautions given. Repeat abdomen exam is soft and nontender.         Richardean Canal, MD 04/24/12  2327 

## 2012-04-24 NOTE — ED Notes (Signed)
Pt presents with chronic abdominal pain recurrent today. States she had an abdominal surgery in February finding adhesions and scarring on her appendix.

## 2012-05-09 ENCOUNTER — Telehealth (INDEPENDENT_AMBULATORY_CARE_PROVIDER_SITE_OTHER): Payer: Self-pay

## 2012-05-09 ENCOUNTER — Telehealth (INDEPENDENT_AMBULATORY_CARE_PROVIDER_SITE_OTHER): Payer: Self-pay | Admitting: General Surgery

## 2012-05-09 ENCOUNTER — Encounter (INDEPENDENT_AMBULATORY_CARE_PROVIDER_SITE_OTHER): Payer: Self-pay

## 2012-05-09 NOTE — Telephone Encounter (Signed)
The pt called and said she is waiting to hear from Dr Biagio Quint after he reviewed some films.  She wants to find out if surgery can be done.  Please call

## 2012-05-09 NOTE — Telephone Encounter (Signed)
Paged Dr. Biagio Quint to call patient to address any concerns and questions she may have.

## 2012-05-09 NOTE — Telephone Encounter (Signed)
I called the patient to discuss the option for appendectomy.  I have reviewed the images that were sent over from Dr. Algie Coffer at the time of her dx laparoscopy.  I again discussed with her the option for appendectomy vs. Observation and I would not recommend appendectomy for her because I think that it would be low yield and small chance of improving her symptoms.  However, since she has had no other potential cause of her symptoms, this is an option.  I really do not have another suggestion for her.  I did offer to refer her to another surgeon for a second opinion and possible appendectomy but she declined on this for now.

## 2012-05-16 ENCOUNTER — Encounter (HOSPITAL_COMMUNITY): Payer: Self-pay | Admitting: *Deleted

## 2012-05-16 DIAGNOSIS — R1110 Vomiting, unspecified: Secondary | ICD-10-CM | POA: Insufficient documentation

## 2012-05-16 DIAGNOSIS — Z3202 Encounter for pregnancy test, result negative: Secondary | ICD-10-CM | POA: Insufficient documentation

## 2012-05-16 DIAGNOSIS — Z79899 Other long term (current) drug therapy: Secondary | ICD-10-CM | POA: Insufficient documentation

## 2012-05-16 DIAGNOSIS — E1169 Type 2 diabetes mellitus with other specified complication: Secondary | ICD-10-CM | POA: Insufficient documentation

## 2012-05-16 DIAGNOSIS — Z794 Long term (current) use of insulin: Secondary | ICD-10-CM | POA: Insufficient documentation

## 2012-05-16 DIAGNOSIS — D72829 Elevated white blood cell count, unspecified: Secondary | ICD-10-CM | POA: Insufficient documentation

## 2012-05-16 DIAGNOSIS — Z87828 Personal history of other (healed) physical injury and trauma: Secondary | ICD-10-CM | POA: Insufficient documentation

## 2012-05-16 DIAGNOSIS — R5383 Other fatigue: Secondary | ICD-10-CM | POA: Insufficient documentation

## 2012-05-16 DIAGNOSIS — Z8659 Personal history of other mental and behavioral disorders: Secondary | ICD-10-CM | POA: Insufficient documentation

## 2012-05-16 DIAGNOSIS — E785 Hyperlipidemia, unspecified: Secondary | ICD-10-CM | POA: Insufficient documentation

## 2012-05-16 DIAGNOSIS — R1031 Right lower quadrant pain: Secondary | ICD-10-CM | POA: Insufficient documentation

## 2012-05-16 DIAGNOSIS — R5381 Other malaise: Secondary | ICD-10-CM | POA: Insufficient documentation

## 2012-05-16 DIAGNOSIS — Z8739 Personal history of other diseases of the musculoskeletal system and connective tissue: Secondary | ICD-10-CM | POA: Insufficient documentation

## 2012-05-16 LAB — URINALYSIS, MICROSCOPIC ONLY
Bilirubin Urine: NEGATIVE
Glucose, UA: >1000 mg/dL — AB
Ketones, ur: NEGATIVE mg/dL
Leukocytes, UA: NEGATIVE
Nitrite: NEGATIVE
Protein, ur: 30 mg/dL — AB
Specific Gravity, Urine: 1.035 — ABNORMAL HIGH (ref 1.005–1.030)
Urobilinogen, UA: 0.2 mg/dL (ref 0.0–1.0)
pH: 5 (ref 5.0–8.0)

## 2012-05-16 LAB — COMPREHENSIVE METABOLIC PANEL
ALT: 18 U/L (ref 0–35)
AST: 14 U/L (ref 0–37)
Albumin: 3.9 g/dL (ref 3.5–5.2)
Alkaline Phosphatase: 157 U/L — ABNORMAL HIGH (ref 39–117)
BUN: 12 mg/dL (ref 6–23)
CO2: 27 mEq/L (ref 19–32)
Calcium: 9.9 mg/dL (ref 8.4–10.5)
Chloride: 94 mEq/L — ABNORMAL LOW (ref 96–112)
Creatinine, Ser: 0.93 mg/dL (ref 0.50–1.10)
GFR calc Af Amer: 89 mL/min — ABNORMAL LOW (ref >90)
GFR calc non Af Amer: 76 mL/min — ABNORMAL LOW (ref >90)
Glucose, Bld: 362 mg/dL — ABNORMAL HIGH (ref 70–99)
Potassium: 3.8 mEq/L (ref 3.5–5.1)
Sodium: 136 mEq/L (ref 135–145)
Total Bilirubin: 0.5 mg/dL (ref 0.3–1.2)
Total Protein: 8.5 g/dL — ABNORMAL HIGH (ref 6.0–8.3)

## 2012-05-16 LAB — CBC WITH DIFFERENTIAL/PLATELET
Basophils Absolute: 0.1 10*3/uL (ref 0.0–0.1)
Basophils Relative: 0 % (ref 0–1)
Eosinophils Absolute: 0.1 10*3/uL (ref 0.0–0.7)
Eosinophils Relative: 1 % (ref 0–5)
HCT: 39.5 % (ref 36.0–46.0)
Hemoglobin: 14.3 g/dL (ref 12.0–15.0)
Lymphocytes Relative: 22 % (ref 12–46)
Lymphs Abs: 3.5 10*3/uL (ref 0.7–4.0)
MCH: 32.5 pg (ref 26.0–34.0)
MCHC: 36.2 g/dL — ABNORMAL HIGH (ref 30.0–36.0)
MCV: 89.8 fL (ref 78.0–100.0)
Monocytes Absolute: 0.7 10*3/uL (ref 0.1–1.0)
Monocytes Relative: 4 % (ref 3–12)
Neutro Abs: 11.8 10*3/uL — ABNORMAL HIGH (ref 1.7–7.7)
Neutrophils Relative %: 73 % (ref 43–77)
Platelets: 367 10*3/uL (ref 150–400)
RBC: 4.4 MIL/uL (ref 3.87–5.11)
RDW: 12.2 % (ref 11.5–15.5)
WBC: 16.2 10*3/uL — ABNORMAL HIGH (ref 4.0–10.5)

## 2012-05-16 LAB — GLUCOSE, CAPILLARY
Glucose-Capillary: 338 mg/dL — ABNORMAL HIGH (ref 70–99)
Glucose-Capillary: 362 mg/dL — ABNORMAL HIGH (ref 70–99)

## 2012-05-16 LAB — LIPASE, BLOOD: Lipase: 23 U/L (ref 11–59)

## 2012-05-16 LAB — POCT PREGNANCY, URINE: Preg Test, Ur: NEGATIVE

## 2012-05-16 NOTE — ED Notes (Signed)
Pt c/o n/v and hyperglycemia since last Wednesday when her Fentanyl patch dose was increased from 12.5 mcg to 50 mcg.  Last vomiting tonight at 7 pm.

## 2012-05-17 ENCOUNTER — Observation Stay (HOSPITAL_COMMUNITY)
Admission: EM | Admit: 2012-05-17 | Discharge: 2012-05-18 | Disposition: A | Discharge status: Home / Self Care | Payer: Managed Care, Other (non HMO) | Attending: Family Medicine | Admitting: Family Medicine

## 2012-05-17 ENCOUNTER — Encounter (HOSPITAL_COMMUNITY): Payer: Self-pay | Admitting: Emergency Medicine

## 2012-05-17 ENCOUNTER — Emergency Department (HOSPITAL_COMMUNITY)
Admission: EM | Admit: 2012-05-17 | Discharge: 2012-05-17 | Disposition: A | Discharge status: Home / Self Care | Payer: Managed Care, Other (non HMO) | Attending: Emergency Medicine | Admitting: Emergency Medicine

## 2012-05-17 DIAGNOSIS — R0789 Other chest pain: Principal | ICD-10-CM | POA: Insufficient documentation

## 2012-05-17 DIAGNOSIS — E669 Obesity, unspecified: Secondary | ICD-10-CM

## 2012-05-17 DIAGNOSIS — R197 Diarrhea, unspecified: Secondary | ICD-10-CM

## 2012-05-17 DIAGNOSIS — Z794 Long term (current) use of insulin: Secondary | ICD-10-CM | POA: Insufficient documentation

## 2012-05-17 DIAGNOSIS — IMO0001 Reserved for inherently not codable concepts without codable children: Secondary | ICD-10-CM | POA: Insufficient documentation

## 2012-05-17 DIAGNOSIS — R079 Chest pain, unspecified: Secondary | ICD-10-CM | POA: Diagnosis present

## 2012-05-17 DIAGNOSIS — R739 Hyperglycemia, unspecified: Secondary | ICD-10-CM

## 2012-05-17 DIAGNOSIS — R1013 Epigastric pain: Secondary | ICD-10-CM | POA: Insufficient documentation

## 2012-05-17 DIAGNOSIS — E109 Type 1 diabetes mellitus without complications: Secondary | ICD-10-CM | POA: Insufficient documentation

## 2012-05-17 DIAGNOSIS — G8929 Other chronic pain: Secondary | ICD-10-CM | POA: Insufficient documentation

## 2012-05-17 DIAGNOSIS — F4310 Post-traumatic stress disorder, unspecified: Secondary | ICD-10-CM | POA: Insufficient documentation

## 2012-05-17 DIAGNOSIS — M797 Fibromyalgia: Secondary | ICD-10-CM

## 2012-05-17 DIAGNOSIS — E119 Type 2 diabetes mellitus without complications: Secondary | ICD-10-CM

## 2012-05-17 DIAGNOSIS — D72829 Elevated white blood cell count, unspecified: Secondary | ICD-10-CM

## 2012-05-17 DIAGNOSIS — M79609 Pain in unspecified limb: Secondary | ICD-10-CM | POA: Insufficient documentation

## 2012-05-17 DIAGNOSIS — R109 Unspecified abdominal pain: Secondary | ICD-10-CM

## 2012-05-17 LAB — GLUCOSE, CAPILLARY: Glucose-Capillary: 215 mg/dL — ABNORMAL HIGH (ref 70–99)

## 2012-05-17 LAB — URINE CULTURE
Colony Count: NO GROWTH
Culture: NO GROWTH

## 2012-05-17 MED ORDER — SODIUM CHLORIDE 0.9 % IV BOLUS (SEPSIS)
1000.0000 mL | Freq: Once | INTRAVENOUS | Status: AC
  Administered 2012-05-17: 1000 mL via INTRAVENOUS

## 2012-05-17 NOTE — ED Notes (Signed)
PT. REPORTS MID CHEST PAIN ONSET 10 PM THIS EVENING , ALSO REPORTS RIGHT/MID ABDOMINAL PAIN WITH NAUSEA AND PRODUCTIVE COUGH ONSET LAST NIGHT , SEEN HERE LAST NIGHT AND WAS DISCHARGED HOME ,  NO MEDS PTA.

## 2012-05-17 NOTE — ED Notes (Signed)
Pt st's nausea and diarrhea started her Fentanyl patch was increased from 12.31mcg to .

## 2012-05-17 NOTE — ED Provider Notes (Signed)
History     CSN: [id number]  Arrival date & time 05/16/12  2220   First MD Initiated Contact with Patient 05/17/12 0035      Chief Complaint  Patient presents with  . Emesis  . Hyperglycemia    (Consider location/radiation/quality/duration/timing/severity/associated sxs/prior treatment) HPI  Patient is a very pleasant 41 yo DM type I who presents with complaints of general malaise preceded by 5d of nonbloody, watery diarrhea. At first, patient had approx 10 episodes per day of diarrhea. This has gradually improved. The patient has had fleeting episodes of nausea and has vomited one time. No fever. PO intake mildly diminished.   Patient has some intermittent RLQ abdominal pain. However, she has had chronic pain in this region for "years" and is treated with Fentanyl patch for this. She notes that her dose was increased from to per hour at appt 5d ago.  No fever.   Patient is noted to be hyperglycemic in ED with glucose of 360 mg/dL. She skipped her evening dose of lantus because she was unable to keep down fluids. Instead, she took 10 units of Novolog for an accucheck of 250 mg/dL at 4098.  She says that this last dose of Lantus is the only dose she has missed. Patient reports that her glucose is generally very well controlled. Last episode of DKA was > 23 years ago.     Past Medical History  Diagnosis Date  . Diabetes mellitus   . Fibromyalgia   . Arthritis   . Hyperlipidemia   . Chills   . Abdominal pain   . Nausea & vomiting   . Biliary dyskinesia   . Depression   . Migraine   . Joint pain   . Skin rash     due to medications  . Post traumatic stress disorder (PTSD)   . Sleep trouble   . Major depressive disorder   . Cervical strain   . Closed head injury     Past Surgical History  Procedure Laterality Date  . Tonsillectomy  2001  . Lipoma removed    . Knee surgery    . Cesarean section  1997, 1999  . Abdominal hysterectomy  2001  . Tumor removal   2000    lower back  . Eye surgery  2006    laser eye surgery left eye  . Cholecystectomy  02/10/2011    Procedure: LAPAROSCOPIC CHOLECYSTECTOMY WITH INTRAOPERATIVE CHOLANGIOGRAM;  Surgeon: Rulon Abide, DO;  Location: [facility] OR;  Service: General;  Laterality: N/A;  . Robotic assisted laparoscopic lysis of adhesion N/A 03/04/2012    Procedure: ROBOTIC ASSISTED LAPAROSCOPIC LYSIS OF EXTENSIVE ADHESIONS;  Surgeon: Tresa Endo A. Ernestina Penna, MD;  Location: [facility] ORS;  Service: Gynecology;  Laterality: N/A;    Family History  Problem Relation Age of Onset  . Cancer Father     lymphoma  . Nephrolithiasis Father   . Vascular Disease Father   . Cancer Maternal Grandmother     colon  . Hypertension Mother   . Heart disease Mother   . Cataracts Mother   . Diabetes Brother   . Mental illness Maternal Grandfather   . Cancer Maternal Grandfather   . Heart disease Paternal Grandmother   . Heart disease Paternal Grandfather     History  Substance Use Topics  . Smoking status: Never Smoker   . Smokeless tobacco: Never Used  . Alcohol Use: No    OB History   Grav Para Term Preterm Abortions TAB SAB  Ect Mult Living                  Review of Systems  Gen: no weight loss, fevers, chills, night sweats Eyes: no discharge or drainage, no occular pain or visual changes Nose: no epistaxis or rhinorrhea Mouth: no dental pain, no sore throat Neck: no neck pain Lungs: no SOB, cough, wheezing CV: no chest pain, palpitations, dependent edema or orthopnea Abd: As per history of present illness, otherwise negative GU: no dysuria or gross hematuria MSK: no myalgias or arthralgias Neuro: no headache, no focal neurologic deficits Skin: no rash Psyche: negative. Endo: as per hpi, otherwise negative  Allergies  Meloxicam; Cephalexin; and Ibuprofen  Home Medications   Current Outpatient Rx  Name  Route  Sig  Dispense  Refill  . acetaminophen (TYLENOL) 500 MG tablet   Oral   Take 1,000 mg by mouth  every 6 (six) hours as needed for pain.         . fentaNYL (DURAGESIC - DOSED MCG/HR) 25 MCG/HR   Transdermal   Place 1 patch onto the skin every 3 (three) days.         Marland Kitchen HYDROcodone-acetaminophen (NORCO/VICODIN) 5-325 MG per tablet   Oral   Take 2 tablets by mouth every 4 (four) hours as needed for pain.   15 tablet   0   . insulin aspart (NOVOLOG) 100 UNIT/ML injection   Subcutaneous   Inject 8 Units into the skin 3 (three) times daily before meals.         . insulin glargine (LANTUS) 100 UNIT/ML injection   Subcutaneous   Inject 28 Units into the skin at bedtime.          . pregabalin (LYRICA) 25 MG capsule   Oral   Take 25 mg by mouth 2 (two) times daily.         . traZODone (DESYREL) 50 MG tablet   Oral   Take 50 mg by mouth at bedtime.           BP 148/83  Pulse 86  Temp(Src) 98.3 F (36.8 C) (Oral)  Resp 20  SpO2 97%  Physical Exam Gen: well developed and well nourished appearing Head: NCAT Eyes: PERL, EOMI Nose: no epistaixis or rhinorrhea Mouth/throat: mucosa is moist and pink Neck: supple, no stridor Lungs: CTA B, no wheezing, rhonchi or rales CV: RRR, no murmur Abd: soft, notender, mildly tender over the RLQ, obese Back: no ttp, no cva ttp Skin: no rashese, wnl Neuro: CN ii-xii grossly intact, no focal deficits Psyche; normal affect,  calm and cooperative.   ED Course  Procedures (including critical care time)  Results for orders placed during the hospital encounter of 05/17/12 (from the past 24 hour(s))  COMPREHENSIVE METABOLIC PANEL     Status: Abnormal   Collection Time    05/16/12 10:52 PM      Result Value Range   Sodium 136  135 - 145 mEq/L   Potassium 3.8  3.5 - 5.1 mEq/L   Chloride 94 (*) 96 - 112 mEq/L   CO2 27  19 - 32 mEq/L   Glucose, Bld 362 (*) 70 - 99 mg/dL   BUN 12  6 - 23 mg/dL   Creatinine, Ser 4.69  0.50 - 1.10 mg/dL   Calcium 9.9  8.4 - 62.9 mg/dL   Total Protein 8.5 (*) 6.0 - 8.3 g/dL   Albumin 3.9  3.5 -  5.2 g/dL   AST 14  0 -  37 U/L   ALT 18  0 - 35 U/L   Alkaline Phosphatase 157 (*) 39 - 117 U/L   Total Bilirubin 0.5  0.3 - 1.2 mg/dL   GFR calc non Af Amer 76 (*) >90 mL/min   GFR calc Af Amer 89 (*) >90 mL/min  CBC WITH DIFFERENTIAL     Status: Abnormal   Collection Time    05/16/12 10:52 PM      Result Value Range   WBC 16.2 (*) 4.0 - 10.5 K/uL   RBC 4.40  3.87 - 5.11 MIL/uL   Hemoglobin 14.3  12.0 - 15.0 g/dL   HCT 16.1  09.6 - 04.5 %   MCV 89.8  78.0 - 100.0 fL   MCH 32.5  26.0 - 34.0 pg   MCHC 36.2 (*) 30.0 - 36.0 g/dL   RDW 40.9  81.1 - 91.4 %   Platelets 367  150 - 400 K/uL   Neutrophils Relative 73  43 - 77 %   Neutro Abs 11.8 (*) 1.7 - 7.7 K/uL   Lymphocytes Relative 22  12 - 46 %   Lymphs Abs 3.5  0.7 - 4.0 K/uL   Monocytes Relative 4  3 - 12 %   Monocytes Absolute 0.7  0.1 - 1.0 K/uL   Eosinophils Relative 1  0 - 5 %   Eosinophils Absolute 0.1  0.0 - 0.7 K/uL   Basophils Relative 0  0 - 1 %   Basophils Absolute 0.1  0.0 - 0.1 K/uL  LIPASE, BLOOD     Status: None   Collection Time    05/16/12 10:52 PM      Result Value Range   Lipase 23  11 - 59 U/L  GLUCOSE, CAPILLARY     Status: Abnormal   Collection Time    05/16/12 11:10 PM      Result Value Range   Glucose-Capillary 338 (*) 70 - 99 mg/dL  GLUCOSE, CAPILLARY     Status: Abnormal   Collection Time    05/16/12 11:13 PM      Result Value Range   Glucose-Capillary 362 (*) 70 - 99 mg/dL   Comment 1 Documented in Chart     Comment 2 Notify RN    POCT PREGNANCY, URINE     Status: None   Collection Time    05/16/12 11:23 PM      Result Value Range   Preg Test, Ur NEGATIVE  NEGATIVE  URINALYSIS, MICROSCOPIC ONLY     Status: Abnormal   Collection Time    05/16/12 11:25 PM      Result Value Range   Color, Urine STRAW (*) YELLOW   APPearance CLEAR  CLEAR   Specific Gravity, Urine 1.035 (*) 1.005 - 1.030   pH 5.0  5.0 - 8.0   Glucose, UA >1000 (*) NEGATIVE mg/dL   Hgb urine dipstick TRACE (*) NEGATIVE    Bilirubin Urine NEGATIVE  NEGATIVE   Ketones, ur NEGATIVE  NEGATIVE mg/dL   Protein, ur 30 (*) NEGATIVE mg/dL   Urobilinogen, UA 0.2  0.0 - 1.0 mg/dL   Nitrite NEGATIVE  NEGATIVE   Leukocytes, UA NEGATIVE  NEGATIVE   WBC, UA 3-6  <3 WBC/hpf   RBC / HPF 0-2  <3 RBC/hpf   Bacteria, UA FEW (*) RARE   Squamous Epithelial / LPF FEW (*) RARE  GLUCOSE, CAPILLARY     Status: Abnormal   Collection Time    05/17/12  2:00 AM  Result Value Range   Glucose-Capillary 215 (*) 70 - 99 mg/dL   Comment 1 Documented in Chart     Comment 2 Notify RN       MDM  Patient with diarrhea and non-ketotic hyperglycemia which may be due to missed last dose of Lantus insulin. Patient has chronic abdominal pain and has a chronic leukocytosis. Noted to have WBC of 16K tonight which is within range of previous studies. We have treated with NS in the ED and the patient's BG is down to 200 mg/dL. She is able to tolerate po intake. VS normal. No C.Diff risk factors. Stable for discharge with plan for outpatient follow up. Counsel to return to the ED for red flag sx. We will send home with a specimen container for stool sample collection to help with outpatient work up should sx continue.         Brandt Loosen, MD 05/17/12 [id number]

## 2012-05-18 ENCOUNTER — Emergency Department (HOSPITAL_COMMUNITY): Payer: Managed Care, Other (non HMO)

## 2012-05-18 ENCOUNTER — Encounter (HOSPITAL_COMMUNITY): Payer: Self-pay | Admitting: General Practice

## 2012-05-18 DIAGNOSIS — M79609 Pain in unspecified limb: Secondary | ICD-10-CM

## 2012-05-18 DIAGNOSIS — M7989 Other specified soft tissue disorders: Secondary | ICD-10-CM

## 2012-05-18 DIAGNOSIS — E669 Obesity, unspecified: Secondary | ICD-10-CM

## 2012-05-18 DIAGNOSIS — E109 Type 1 diabetes mellitus without complications: Secondary | ICD-10-CM

## 2012-05-18 DIAGNOSIS — R079 Chest pain, unspecified: Secondary | ICD-10-CM

## 2012-05-18 DIAGNOSIS — IMO0001 Reserved for inherently not codable concepts without codable children: Secondary | ICD-10-CM

## 2012-05-18 LAB — D-DIMER, QUANTITATIVE (NOT AT ARMC): D-Dimer, Quant: 0.4 ug/mL-FEU (ref 0.00–0.48)

## 2012-05-18 LAB — CBC WITH DIFFERENTIAL/PLATELET
Basophils Absolute: 0.1 10*3/uL (ref 0.0–0.1)
Basophils Relative: 0 % (ref 0–1)
Eosinophils Absolute: 0.2 10*3/uL (ref 0.0–0.7)
Eosinophils Relative: 1 % (ref 0–5)
HCT: 40.5 % (ref 36.0–46.0)
Hemoglobin: 14.8 g/dL (ref 12.0–15.0)
Lymphocytes Relative: 26 % (ref 12–46)
Lymphs Abs: 3.4 10*3/uL (ref 0.7–4.0)
MCH: 32.7 pg (ref 26.0–34.0)
MCHC: 36.5 g/dL — ABNORMAL HIGH (ref 30.0–36.0)
MCV: 89.6 fL (ref 78.0–100.0)
Monocytes Absolute: 0.6 10*3/uL (ref 0.1–1.0)
Monocytes Relative: 5 % (ref 3–12)
Neutro Abs: 8.8 10*3/uL — ABNORMAL HIGH (ref 1.7–7.7)
Neutrophils Relative %: 68 % (ref 43–77)
Platelets: 256 10*3/uL (ref 150–400)
RBC: 4.52 MIL/uL (ref 3.87–5.11)
RDW: 12.2 % (ref 11.5–15.5)
WBC: 13 10*3/uL — ABNORMAL HIGH (ref 4.0–10.5)

## 2012-05-18 LAB — COMPREHENSIVE METABOLIC PANEL
ALT: 16 U/L (ref 0–35)
AST: 22 U/L (ref 0–37)
Albumin: 3.4 g/dL — ABNORMAL LOW (ref 3.5–5.2)
Alkaline Phosphatase: 145 U/L — ABNORMAL HIGH (ref 39–117)
BUN: 8 mg/dL (ref 6–23)
CO2: 26 mEq/L (ref 19–32)
Calcium: 9.6 mg/dL (ref 8.4–10.5)
Chloride: 99 mEq/L (ref 96–112)
Creatinine, Ser: 0.86 mg/dL (ref 0.50–1.10)
GFR calc Af Amer: >90 mL/min (ref >90)
GFR calc non Af Amer: 84 mL/min — ABNORMAL LOW (ref >90)
Glucose, Bld: 190 mg/dL — ABNORMAL HIGH (ref 70–99)
Potassium: 3.7 mEq/L (ref 3.5–5.1)
Sodium: 136 mEq/L (ref 135–145)
Total Bilirubin: 0.3 mg/dL (ref 0.3–1.2)
Total Protein: 7.8 g/dL (ref 6.0–8.3)

## 2012-05-18 LAB — CBC
HCT: 41.5 % (ref 36.0–46.0)
Hemoglobin: 15 g/dL (ref 12.0–15.0)
MCH: 32.5 pg (ref 26.0–34.0)
MCHC: 36.1 g/dL — ABNORMAL HIGH (ref 30.0–36.0)
MCV: 89.8 fL (ref 78.0–100.0)
Platelets: 341 10*3/uL (ref 150–400)
RBC: 4.62 MIL/uL (ref 3.87–5.11)
RDW: 12.1 % (ref 11.5–15.5)
WBC: 13.7 10*3/uL — ABNORMAL HIGH (ref 4.0–10.5)

## 2012-05-18 LAB — GLUCOSE, CAPILLARY
Glucose-Capillary: 76 mg/dL (ref 70–99)
Glucose-Capillary: 89 mg/dL (ref 70–99)
Glucose-Capillary: 147 mg/dL — ABNORMAL HIGH (ref 70–99)
Glucose-Capillary: 274 mg/dL — ABNORMAL HIGH (ref 70–99)

## 2012-05-18 LAB — TROPONIN I
Troponin I: <0.3 ng/mL (ref <0.30)
Troponin I: <0.3 ng/mL (ref <0.30)

## 2012-05-18 LAB — TSH: TSH: 5.216 u[IU]/mL — ABNORMAL HIGH (ref 0.350–4.500)

## 2012-05-18 LAB — HEMOGLOBIN A1C
Hgb A1c MFr Bld: 8.5 % — ABNORMAL HIGH (ref <5.7)
Mean Plasma Glucose: 197 mg/dL — ABNORMAL HIGH (ref <117)

## 2012-05-18 LAB — POCT I-STAT TROPONIN I: Troponin i, poc: 0 ng/mL (ref 0.00–0.08)

## 2012-05-18 LAB — LIPID PANEL
Cholesterol: 276 mg/dL — ABNORMAL HIGH (ref 0–200)
HDL: 68 mg/dL (ref >39)
LDL Cholesterol: 179 mg/dL — ABNORMAL HIGH (ref 0–99)
Total CHOL/HDL Ratio: 4.1 RATIO
Triglycerides: 145 mg/dL (ref <150)
VLDL: 29 mg/dL (ref 0–40)

## 2012-05-18 LAB — MAGNESIUM: Magnesium: 2.1 mg/dL (ref 1.5–2.5)

## 2012-05-18 LAB — LIPASE, BLOOD: Lipase: 35 U/L (ref 11–59)

## 2012-05-18 MED ORDER — INSULIN ASPART 100 UNIT/ML ~~LOC~~ SOLN: 0.0000 [IU] | Freq: Every day | SUBCUTANEOUS | Status: DC

## 2012-05-18 MED ORDER — PREGABALIN 25 MG PO CAPS
25.0000 mg | ORAL_CAPSULE | Freq: Two times a day (BID) | ORAL | Status: DC
  Administered 2012-05-18: 25 mg via ORAL
  Filled 2012-05-18: qty 1

## 2012-05-18 MED ORDER — INSULIN ASPART 100 UNIT/ML ~~LOC~~ SOLN
4.0000 [IU] | Freq: Three times a day (TID) | SUBCUTANEOUS | Status: DC
  Administered 2012-05-18: 4 [IU] via SUBCUTANEOUS

## 2012-05-18 MED ORDER — MORPHINE SULFATE 4 MG/ML IJ SOLN
4.0000 mg | Freq: Once | INTRAMUSCULAR | Status: AC
  Administered 2012-05-18: 4 mg via INTRAVENOUS
  Filled 2012-05-18: qty 1

## 2012-05-18 MED ORDER — HEPARIN SODIUM (PORCINE) 5000 UNIT/ML IJ SOLN
5000.0000 [IU] | Freq: Three times a day (TID) | INTRAMUSCULAR | Status: DC
  Filled 2012-05-18 (×4): qty 1

## 2012-05-18 MED ORDER — ASPIRIN EC 81 MG PO TBEC: 81.0000 mg | DELAYED_RELEASE_TABLET | Freq: Every day | ORAL | Status: DC

## 2012-05-18 MED ORDER — ACETAMINOPHEN 325 MG PO TABS: 650.0000 mg | ORAL_TABLET | ORAL | Status: DC | PRN

## 2012-05-18 MED ORDER — ATORVASTATIN CALCIUM 40 MG PO TABS
40.0000 mg | ORAL_TABLET | Freq: Every day | ORAL | Status: DC
  Administered 2012-05-18: 40 mg via ORAL
  Filled 2012-05-18: qty 1

## 2012-05-18 MED ORDER — ASPIRIN 81 MG PO TBEC: 81.0000 mg | DELAYED_RELEASE_TABLET | Freq: Every day | ORAL | Status: DC

## 2012-05-18 MED ORDER — INSULIN GLARGINE 100 UNIT/ML ~~LOC~~ SOLN
20.0000 [IU] | Freq: Every day | SUBCUTANEOUS | Status: DC
  Filled 2012-05-18: qty 0.2

## 2012-05-18 MED ORDER — NITROGLYCERIN 0.4 MG SL SUBL: 0.4000 mg | SUBLINGUAL_TABLET | SUBLINGUAL | Status: DC | PRN

## 2012-05-18 MED ORDER — ASPIRIN 81 MG PO CHEW
324.0000 mg | CHEWABLE_TABLET | Freq: Once | ORAL | Status: AC
  Administered 2012-05-18: 324 mg via ORAL
  Filled 2012-05-18: qty 4

## 2012-05-18 MED ORDER — PANTOPRAZOLE SODIUM 40 MG PO TBEC: 40.0000 mg | DELAYED_RELEASE_TABLET | Freq: Every day | ORAL | Status: DC

## 2012-05-18 MED ORDER — ONDANSETRON HCL 4 MG/2ML IJ SOLN
4.0000 mg | Freq: Once | INTRAMUSCULAR | Status: AC
  Administered 2012-05-18: 4 mg via INTRAVENOUS
  Filled 2012-05-18: qty 2

## 2012-05-18 MED ORDER — MORPHINE SULFATE 2 MG/ML IJ SOLN
2.0000 mg | Freq: Once | INTRAMUSCULAR | Status: AC
  Administered 2012-05-18: 2 mg via INTRAVENOUS
  Filled 2012-05-18: qty 1

## 2012-05-18 MED ORDER — TRAZODONE HCL 50 MG PO TABS
50.0000 mg | ORAL_TABLET | Freq: Every day | ORAL | Status: DC
  Filled 2012-05-18: qty 1

## 2012-05-18 MED ORDER — ATORVASTATIN CALCIUM 40 MG PO TABS: 40.0000 mg | ORAL_TABLET | Freq: Every day | ORAL | Status: DC

## 2012-05-18 MED ORDER — INSULIN ASPART 100 UNIT/ML ~~LOC~~ SOLN
0.0000 [IU] | Freq: Three times a day (TID) | SUBCUTANEOUS | Status: DC
  Administered 2012-05-18: 11 [IU] via SUBCUTANEOUS
  Administered 2012-05-18: 3 [IU] via SUBCUTANEOUS

## 2012-05-18 MED ORDER — PANTOPRAZOLE SODIUM 40 MG PO TBEC
40.0000 mg | DELAYED_RELEASE_TABLET | Freq: Every day | ORAL | Status: DC
  Administered 2012-05-18: 40 mg via ORAL
  Filled 2012-05-18: qty 1

## 2012-05-18 MED ORDER — HYDROCODONE-ACETAMINOPHEN 5-325 MG PO TABS
2.0000 | ORAL_TABLET | ORAL | Status: DC | PRN
  Administered 2012-05-18: 2 via ORAL
  Filled 2012-05-18: qty 2

## 2012-05-18 MED ORDER — ONDANSETRON HCL 4 MG/2ML IJ SOLN: 4.0000 mg | Freq: Four times a day (QID) | INTRAMUSCULAR | Status: DC | PRN

## 2012-05-18 NOTE — H&P (Signed)
I have seen and examined this patient. I have discussed with Dr Mikel Cella and Dr Ermalinda Memos.  I agree with their findings and plans as documented in their admission note.  Acute Issues 1. Chest Pain, acute, substernal - moderate CAD risk given DMT1 and hyperlipidemia.  - Events prior to chest pain onset: Pt was eating, onset of old right lumbar quadrant pain and new epigastric pain, followed by an unusual paroxysm of coughing that was followed by the substernal pain.   No dysphagia. Patient had EGD and colonoscopy several years ago (~2009) for workup of recurrent abdominal pain.  No recalled abnormalities.  - Chest pain lasted about an hour and  resolved soon after Morphine in ED - Patient noted right calf pain (new) and patient's right calf is 2 to 3 cm larger in circumference compared to left calf. Pt does not know if this is an old finding.  - Pt has long standing early satiety. -  Working Differential Diagnosis: Esophageal origin of pain (spasm, GERD with esophagitis), Stomach origin or pain (PUD, gastroparesis, functional UGI disorder), Chest Wall origin of pain (ligament/muscular strain), pancreatitis, Rule out ACS, Risk stratify for VTE  Plan: - complete ACS rule out - Start empiric full dose PPI for 8 weeks for possibility of esophagitis. - Feed patient, observe for recurrence of postprandial events from yesterday.  - Right leg Lower Extremity venous doppler to rule out DVT.  - Check Lipase. - Outpatient CAD assessment by [facility] unless patient has recurrence of the pain.  - Patient may benefit from Gastric Emptying study at some point to look for evidence of diabetic gastroparesis.

## 2012-05-18 NOTE — Progress Notes (Signed)
Right lower extremity venous duplex completed.  Right:  No evidence of DVT, superficial thrombosis, or a Baker's cyst.  Left:  Negative for DVT in the common femoral vein. 

## 2012-05-18 NOTE — H&P (Signed)
Family Medicine Teaching [facility] Admission History and Physical Service Pager: [phone]  Patient name: Maureen [name] Medical record number: [id number] Date of birth: [birth year] Age: 42 y.o. Gender: female  Primary Care Provider: Elvina Sidle, MD  Chief Complaint:Chest pain  Assessment and Plan: Maureen [name] is a 42 y.o. year old female with PMHx of T1DM, HLD, fibromyalgia, chronic abd pain, and PTSD presenting with Chest pain for 7 hours. Considering her risk factors of DM1, Family Hx of CAD, and HLD the ED has asked [place] to admit her for chest pain rule out. In addition to cardiac etiology we will consider PE, anxiety, and referred abdominal pain. PE is a consideration with her new onset leg pain however seems very unlikely given without pleuritic chest pain, tachycardia, or dyspnea. Anxiety also seems likely with concurrent PTSD. Referred abdominal pain from another chronic process is also very likely.   Chest pain - Typical and atypical features - admit to family practice teaching service to Telemetry - As above, we will plan to rule out ACS. It's likely that anxiety and/or chronic abdominal pain is playing a role in her current state.  - POC troponin negative X1, given tuime coarse we will cycle 2 more troponins - repeat EKG in 12 hours - Risk stratification labs: FAsting lipids, A1C, TSH\ - D Dimer as I feel PE is unlikely - PRN nitro, zofran, morphine - Start daily aspirin, lipitor 40 given her DM1 - monitor, consult [facility] if she rules in, consider setting up OP stress test  T1DM - Glucose 190 tonight, A1C 9.1 in 2/14 - takes lantus 28 U nightly at home + 8 u  Pre-meal novalog + SSI at home - decrease some while IP- 20 units lantus qHS + SSI - monitor CBGs  R calf pain - with grossly larger R calf but no tenderness/erythema on exam DVT is possible but still seems unlikely.  - Will consider LE venous duplex later this am pending a  - D dimer as  I feel DVT is also unlikely  Chronic Abdominal pain - Seen recently by Dr. Biagio Quint at [facility] who offered apendectomy but felt it would be low yield. Elective Chole in 02/10/2011 which resolved pain fo rabout 6 months and now hs returned - Benign abdominal exam - Alk phos 145 - monitor  Fibromyalgia - no new complaints - continue Lyrica and Vicodin per home dose  PTSD - At baseline, no treatment OP, monitor  FEN/GI: Carb modified, SLIV Prophylaxis: sub q heparin Disposition: Tele, home when ACS ruled out Code Status: Full  History of Present Illness: AURI Maureen [name] is a 42 y.o. year old female presenting with chest pain starting about 9 om last night. She had abdominal the previous night which brought her to the ED. She went out tonight to dinner with her children around 7pm at which time her abd pain returned. On the way home she began coughing (9pm) and could not stop leading to the onset of her chest pain. She describes it as pressure like pain centrally located radiating up to her neck and to her L shoulder. Sh ealso c/o neck and BL eye pain at this time. The pain resolved in the ed with aspirin, nitro, and morphine about 7 hours later. It is relieved with lying back and exacerbated with sitting up, it is not increased with deep inspiration. She denies sweating and nausea associated with this. She denies chest pain like this in the past. She has HLD and her  mother had an MI at age 7. She denies smoking and HTN. She has seen [facility] in the past.   She has had some nausea that seems to be more associated with her abdominal pain this evening. She had exploratory surgery Mar 04, 2012 for lysis of adhesions when her appendix was found to be severely adhered to the abdominal wall. She was sent to pain management who put her on lyrica, fentanyl, and trazodone. Her nausea improves with removing her fentanyl patch and so she has not been wearing it.    She endorses headache, sore throat,  and new pain in her legs. She denies vision changes, diarrhea, and dysuria. Her leg pain is R sided and localized to her calf.   Patient Active Problem List   Diagnosis Date Noted  . Endometriosis 12/08/2011  . Arthritis of left knee 12/08/2011  . Diabetic retinopathy 12/08/2011  . Fibromyalgia 10/23/2011  . DM type 1 (diabetes mellitus, type 1) 10/13/2011   Past Medical History: Past Medical History  Diagnosis Date  . Diabetes mellitus   . Fibromyalgia   . Arthritis   . Hyperlipidemia   . Chills   . Abdominal pain   . Nausea & vomiting   . Biliary dyskinesia   . Depression   . Migraine   . Joint pain   . Skin rash     due to medications  . Post traumatic stress disorder (PTSD)   . Sleep trouble   . Major depressive disorder   . Cervical strain   . Closed head injury    Past Surgical History: Past Surgical History  Procedure Laterality Date  . Tonsillectomy  2001  . Lipoma removed    . Knee surgery    . Cesarean section  1997, 1999  . Abdominal hysterectomy  2001  . Tumor removal  2000    lower back  . Eye surgery  2006    laser eye surgery left eye  . Cholecystectomy  02/10/2011    Procedure: LAPAROSCOPIC CHOLECYSTECTOMY WITH INTRAOPERATIVE CHOLANGIOGRAM;  Surgeon: Rulon Abide, DO;  Location: [facility] OR;  Service: General;  Laterality: N/A;  . Robotic assisted laparoscopic lysis of adhesion N/A 03/04/2012    Procedure: ROBOTIC ASSISTED LAPAROSCOPIC LYSIS OF EXTENSIVE ADHESIONS;  Surgeon: Tresa Endo A. Ernestina Penna, MD;  Location: [facility] ORS;  Service: Gynecology;  Laterality: N/A;   Social History: History  Substance Use Topics  . Smoking status: Never Smoker   . Smokeless tobacco: Never Used  . Alcohol Use: No   For any additional social history documentation, please refer to relevant sections of EMR.  Family History: Family History  Problem Relation Age of Onset  . Cancer Father     lymphoma  . Nephrolithiasis Father   . Vascular Disease Father   . Cancer  Maternal Grandmother     colon  . Hypertension Mother   . Heart disease Mother   . Cataracts Mother   . Diabetes Brother   . Mental illness Maternal Grandfather   . Cancer Maternal Grandfather   . Heart disease Paternal Grandmother   . Heart disease Paternal Grandfather    Allergies: Allergies  Allergen Reactions  . Meloxicam Nausea Only  . Cephalexin Rash  . Ibuprofen Nausea And Vomiting and Rash   No current facility-administered medications on file prior to encounter.   Current Outpatient Prescriptions on File Prior to Encounter  Medication Sig Dispense Refill  . acetaminophen (TYLENOL) 500 MG tablet Take 1,000 mg by mouth every 6 (six)  hours as needed for pain.      . fentaNYL (DURAGESIC - DOSED MCG/HR) 25 MCG/HR Place 1 patch onto the skin every 3 (three) days.      Marland Kitchen HYDROcodone-acetaminophen (NORCO/VICODIN) 5-325 MG per tablet Take 2 tablets by mouth every 4 (four) hours as needed for pain.  15 tablet  0  . insulin aspart (NOVOLOG) 100 UNIT/ML injection Inject 8 Units into the skin 3 (three) times daily before meals.      . insulin glargine (LANTUS) 100 UNIT/ML injection Inject 28 Units into the skin at bedtime.       . pregabalin (LYRICA) 25 MG capsule Take 25 mg by mouth 2 (two) times daily.      . traZODone (DESYREL) 50 MG tablet Take 50 mg by mouth at bedtime.       Review Of Systems: Per HPI   Physical Exam: BP 135/83  Pulse 79  Temp(Src) 97.8 F (36.6 C) (Oral)  Resp 16  SpO2 98% Exam: Gen: NAD, alert, cooperative with exam HEENT: NCAT, EOMI, PERRL CV: RRR, good S1/S2, no murmur Resp: CTABL, no wheezes, non-labored Abd: SNTND, BS present, no guarding or organomegaly, 3 well healed hyperpigmented scars consistent with previous port holes Ext: trace BL LE edema, R calf is grossly larger than L, BL calves non tender to palption, without erythema.  Neuro: Alert and oriented, No gross deficits   Labs and Imaging: CBC BMET   Recent Labs Lab 05/18/12 0040   WBC 13.0*  HGB 14.8  HCT 40.5  PLT 256    Recent Labs Lab 05/18/12 0040  NA 136  K 3.7  CL 99  CO2 26  BUN 8  CREATININE 0.86  GLUCOSE 190*  CALCIUM 9.6     POC Troponin 0.00  Recent Labs Lab 05/16/12 2252 05/18/12 0040  AST 14 22  ALT 18 16  ALKPHOS 157* 145*  BILITOT 0.5 0.3  PROT 8.5* 7.8  ALBUMIN 3.9 3.4*     DG Chest 2 view 05/18/2012 IMPRESSION:  No evidence of active pulmonary disease.   Kevin Fenton, MD 05/18/2012, 4:04 AM   I have seen and examined Ms. Mayford Knife and agree with Dr. Felipa Emory note above. She is admitted for chest pain rule out, however as far as I can tell, her HEART score is 2. She does have some unilateral leg pain on ROS so we will add on d.dimer. Continue to cycle troponins, do risk stratification labs and will likely need outpatient stress test. Amber M. Hairford, M.D. 05/18/2012 7:15 AM

## 2012-05-18 NOTE — ED Notes (Signed)
IV TEAM NOTIFIED

## 2012-05-18 NOTE — H&P (Deleted)
Family Medicine Teaching [facility] Discharge Summary  Patient name: Maureen [name] Medical record number: [id number] Date of birth: [birth year] Age: 42 y.o. Gender: female Date of Admission: 05/17/2012  Date of Discharge: 05/18/2012 Admitting Physician: Leighton Roach McDiarmid, MD  Primary Care Provider: Elvina Sidle, MD  Indication for Hospitalization:Chest pain Discharge Diagnoses:  1. Chest pain, non cardiac in origin 2. T1Dm 3. R calf pain 4. Chronic abdominal  5. Fibromyalgia 6. PTSD  Consultations: None  Significant Labs and Imaging:    Recent Labs Lab 05/18/12 0640  TROPONINI <0.30    Recent Labs Lab 05/16/12 2252 05/18/12 0040 05/18/12 0640  HGB 14.3 14.8 15.0  HCT 39.5 40.5 41.5  WBC 16.2* 13.0* 13.7*  PLT 367 256 341    Recent Labs Lab 05/16/12 2252 05/18/12 0040 05/18/12 0640  NA 136 136  --   K 3.8 3.7  --   CL 94* 99  --   CO2 27 26  --   GLUCOSE 362* 190*  --   BUN 12 8  --   CREATININE 0.93 0.86  --   CALCIUM 9.9 9.6  --   MG  --   --  2.1     Recent Labs Lab 05/16/12 2252 05/18/12 0040  AST 14 22  ALT 18 16  ALKPHOS 157* 145*  BILITOT 0.5 0.3  PROT 8.5* 7.8  ALBUMIN 3.9 3.4*    Recent Labs Lab 05/17/12 0200 05/18/12 0621 05/18/12 0728 05/18/12 1129 05/18/12 1707  GLUCAP 215* 76 89 147* 274*   Lipase 35 (11-59) D Dimer 0.40 (0-0.48)  DG chest 2 view 05/17/2012 IMPRESSION:  No evidence of active pulmonary disease.  Procedures: None  Brief Hospital Course:  Maureen [name] is a 42 y.o. year old female with PMHx of T1DM, HLD, fibromyalgia, chronic abd pain, and PTSD presenting with Chest pain for 7 hours. Ruled out for ACS after inpatient work-up.   Chest pain  Her chest pain began after eating, relieved with lying backk and worsened with sitting up, and lasted for hours. With radiation to the L shoulder and pressure like character she had typical and atypical features. She has risk factors of a mother with an  MI at age 27, HLD, and DM1. She was admitted to tele, cardiac enzymes cycled, and follow up ekg ordered. She ruled out for ACS with negative enzymes and stable EKG. She was advised to contact her cardiologist for stress test and follow up with her PCP. Additional risk stratification labs and preventative meds are detailed below.   After work up we feel that and esophageal or gastric cause is likely to blame, considering PUD, esophageal spasm, GERD with esophagitis, gastroperesis. We started her on a 8 week coarse of PPI.  - FAsting lipids TChol 276, LDL 179, HDL 68 - A1C 8.5 - TSH 5.2 - D Dimer 0.4 - Lipase 35 - Start daily aspirin and lipitor 40   T1DM  She was managed inpatient with a slightly reduced insulin from her home doses, which was restarted on DC. Her A1C was found to be 8.5.   R calf pain  Grossly larger on exam so a D dimer was drawn that was WNL. ALso a venous duplex was performed prior to dc which was preliminarily negative.   Chronic Abdominal pain  Benign abdominal exam, no acute interventions required. Started Protonix as explained above.   Fibromyalgia  We continued he home vicoden and lyrica.   PTSD - No acute treatments  Discharge Medications:  Medication List    STOP taking these medications       fentaNYL 25 MCG/HR  Commonly known as:  DURAGESIC - dosed mcg/hr      TAKE these medications       acetaminophen 500 MG tablet  Commonly known as:  TYLENOL  Take 1,000 mg by mouth every 6 (six) hours as needed for pain.     aspirin 81 MG EC tablet  Take 1 tablet (81 mg total) by mouth daily.  Start taking on:  05/19/2012     atorvastatin 40 MG tablet  Commonly known as:  LIPITOR  Take 1 tablet (40 mg total) by mouth daily at 6 PM.     HYDROcodone-acetaminophen 5-325 MG per tablet  Commonly known as:  NORCO/VICODIN  Take 2 tablets by mouth every 4 (four) hours as needed for pain.     insulin aspart 100 UNIT/ML injection  Commonly known as:  novoLOG   Inject 8 Units into the skin 3 (three) times daily before meals.     insulin glargine 100 UNIT/ML injection  Commonly known as:  LANTUS  Inject 28 Units into the skin at bedtime.     pantoprazole 40 MG tablet  Commonly known as:  PROTONIX  Take 1 tablet (40 mg total) by mouth daily at 6 (six) AM.     pregabalin 25 MG capsule  Commonly known as:  LYRICA  Take 25 mg by mouth 2 (two) times daily.     traZODone 50 MG tablet  Commonly known as:  DESYREL  Take 50 mg by mouth at bedtime.       Issues for Follow Up:  - Consider cardiac work up with stress test considering her risk factors - Consider gastric emptying study to look for diabetic gastroperesis  Outstanding Results: None  Discharge Instructions: Please refer to Patient Instructions section of EMR for full details.  Patient was counseled important signs and symptoms that should prompt return to medical care, changes in medications, dietary instructions, activity restrictions, and follow up appointments.   Follow-up Information   Follow up with Elvina Sidle, MD. (call today or tomorrow for appointment in 1 week for hospital follow up)    Contact information:   [address] [place] [address] [phone]       Follow up with [facility]. (Call for appt when available for hospital follow-up)    Contact information:   [address] [place] [address]       Discharge Condition: Carlena Sax, MD 05/18/2012, 11:57 AM

## 2012-05-18 NOTE — ED Provider Notes (Signed)
History     CSN: [id number]  Arrival date & time 05/17/12  2354   First MD Initiated Contact with Patient 05/18/12 0019      Chief Complaint  Patient presents with  . Chest Pain  . Abdominal Pain    (Consider location/radiation/quality/duration/timing/severity/associated sxs/prior treatment) HPI  Maureen [name] is a very pleasant 42 yo woman with multiple chronic medical problems including type I DM, chronic abdominal pain, fibromyalgia, migraine headaches and PTSD.   She was seen by me in the ED last night with complaints of diarrhea, abdominal pain and hyperglycemia. She was found to have nonketotic hyperglycemia which improved significantly with IVF. She ultimately felt better, asked to go home and was discharged. She called called to make f/u with her PCP and has an appt at 1315 today.   She comes in tonight because she developed chest pain after a prolonged coughing spell which began around 2000 while she was on the way home from dinner with her family. The patient says her chest pain is still lingering. It is centrally located and radiates "all the way up to my throat".  "It feels like someone is sitting there". Patient denies nausea and vomiting, diaphoresis, SOB, hemoptysis.   Pain is 8/10, has been constant in severity. The patient notes that she has a high tolerance for pain but says, "it really hurts".  She says pain in chest feels worse when she sits up. Her abdominal pain feels worse when her legs are extended.   Coughing was preceded by diffuse abdominal pain. No fever. Diarrhea has resolved.   Patient has no history of CAD. She says she had a normal cardiac stress test at approximately 88 yrs of age. She has never been a smoker. Denies history of hypertension. Has hypercholesterolemia and a positive FH. Mom had MI at age 44 yo.   Past Medical History  Diagnosis Date  . Diabetes mellitus   . Fibromyalgia   . Arthritis   . Hyperlipidemia   . Chills   . Abdominal pain    . Nausea & vomiting   . Biliary dyskinesia   . Depression   . Migraine   . Joint pain   . Skin rash     due to medications  . Post traumatic stress disorder (PTSD)   . Sleep trouble   . Major depressive disorder   . Cervical strain   . Closed head injury     Past Surgical History  Procedure Laterality Date  . Tonsillectomy  2001  . Lipoma removed    . Knee surgery    . Cesarean section  1997, 1999  . Abdominal hysterectomy  2001  . Tumor removal  2000    lower back  . Eye surgery  2006    laser eye surgery left eye  . Cholecystectomy  02/10/2011    Procedure: LAPAROSCOPIC CHOLECYSTECTOMY WITH INTRAOPERATIVE CHOLANGIOGRAM;  Surgeon: Rulon Abide, DO;  Location: [facility] OR;  Service: General;  Laterality: N/A;  . Robotic assisted laparoscopic lysis of adhesion N/A 03/04/2012    Procedure: ROBOTIC ASSISTED LAPAROSCOPIC LYSIS OF EXTENSIVE ADHESIONS;  Surgeon: Tresa Endo A. Ernestina Penna, MD;  Location: [facility] ORS;  Service: Gynecology;  Laterality: N/A;    Family History  Problem Relation Age of Onset  . Cancer Father     lymphoma  . Nephrolithiasis Father   . Vascular Disease Father   . Cancer Maternal Grandmother     colon  . Hypertension Mother   . Heart disease Mother   .  Cataracts Mother   . Diabetes Brother   . Mental illness Maternal Grandfather   . Cancer Maternal Grandfather   . Heart disease Paternal Grandmother   . Heart disease Paternal Grandfather     History  Substance Use Topics  . Smoking status: Never Smoker   . Smokeless tobacco: Never Used  . Alcohol Use: No    OB History   Grav Para Term Preterm Abortions TAB SAB Ect Mult Living                  Review of Systems Gen: no weight loss, fevers, chills, night sweats Eyes: no discharge or drainage, no occular pain or visual changes Nose: no epistaxis or rhinorrhea Mouth: no dental pain, no sore throat Neck: no neck pain Lungs: As per history of present illness, otherwise negative CV: As per history  of present illness, otherwise negative Abd: As per history of present illness, otherwise negative GU: no dysuria or gross hematuria MSK: no myalgias or arthralgias Neuro: no headache, no focal neurologic deficits Skin: no rash Psyche: negative. Endo: acuchecks have been in the 180-200 range since patient was discharged from ED  Yesterday. Patient took her dose of Lantus.   Allergies  Meloxicam; Cephalexin; and Ibuprofen  Home Medications   Current Outpatient Rx  Name  Route  Sig  Dispense  Refill  . acetaminophen (TYLENOL) 500 MG tablet   Oral   Take 1,000 mg by mouth every 6 (six) hours as needed for pain.         . fentaNYL (DURAGESIC - DOSED MCG/HR) 25 MCG/HR   Transdermal   Place 1 patch onto the skin every 3 (three) days.         Marland Kitchen HYDROcodone-acetaminophen (NORCO/VICODIN) 5-325 MG per tablet   Oral   Take 2 tablets by mouth every 4 (four) hours as needed for pain.   15 tablet   0   . insulin aspart (NOVOLOG) 100 UNIT/ML injection   Subcutaneous   Inject 8 Units into the skin 3 (three) times daily before meals.         . insulin glargine (LANTUS) 100 UNIT/ML injection   Subcutaneous   Inject 28 Units into the skin at bedtime.          . pregabalin (LYRICA) 25 MG capsule   Oral   Take 25 mg by mouth 2 (two) times daily.         . traZODone (DESYREL) 50 MG tablet   Oral   Take 50 mg by mouth at bedtime.           BP 166/71  Pulse 92  Temp(Src) 97.8 F (36.6 C) (Oral)  Resp 18  SpO2 98%  Physical Exam Gen: well developed and well nourished appearing Head: NCAT Eyes: PERL, EOMI Nose: no epistaixis or rhinorrhea Mouth/throat: mucosa is moist and pink Neck: supple, no stridor Lungs: CTA B, no wheezing, rhonchi or rales CV: RRR, no murmur, extremities well perfused Abd: soft, notender, nondistended Back: no ttp, no cva ttp Skin: no rashese, wnl Neuro: CN ii-xii grossly intact, no focal deficits Psyche; normal affect,  calm and cooperative.    ED Course  Procedures (including critical care time)  Results for orders placed during the hospital encounter of 05/17/12 (from the past 24 hour(s))  CBC WITH DIFFERENTIAL     Status: Abnormal   Collection Time    05/18/12 12:40 AM      Result Value Range   WBC 13.0 (*) 4.0 -  10.5 K/uL   RBC 4.52  3.87 - 5.11 MIL/uL   Hemoglobin 14.8  12.0 - 15.0 g/dL   HCT 40.9  81.1 - 91.4 %   MCV 89.6  78.0 - 100.0 fL   MCH 32.7  26.0 - 34.0 pg   MCHC 36.5 (*) 30.0 - 36.0 g/dL   RDW 78.2  95.6 - 21.3 %   Platelets 256  150 - 400 K/uL   Neutrophils Relative 68  43 - 77 %   Neutro Abs 8.8 (*) 1.7 - 7.7 K/uL   Lymphocytes Relative 26  12 - 46 %   Lymphs Abs 3.4  0.7 - 4.0 K/uL   Monocytes Relative 5  3 - 12 %   Monocytes Absolute 0.6  0.1 - 1.0 K/uL   Eosinophils Relative 1  0 - 5 %   Eosinophils Absolute 0.2  0.0 - 0.7 K/uL   Basophils Relative 0  0 - 1 %   Basophils Absolute 0.1  0.0 - 0.1 K/uL  COMPREHENSIVE METABOLIC PANEL     Status: Abnormal   Collection Time    05/18/12 12:40 AM      Result Value Range   Sodium 136  135 - 145 mEq/L   Potassium 3.7  3.5 - 5.1 mEq/L   Chloride 99  96 - 112 mEq/L   CO2 26  19 - 32 mEq/L   Glucose, Bld 190 (*) 70 - 99 mg/dL   BUN 8  6 - 23 mg/dL   Creatinine, Ser 0.86  0.50 - 1.10 mg/dL   Calcium 9.6  8.4 - 57.8 mg/dL   Total Protein 7.8  6.0 - 8.3 g/dL   Albumin 3.4 (*) 3.5 - 5.2 g/dL   AST 22  0 - 37 U/L   ALT 16  0 - 35 U/L   Alkaline Phosphatase 145 (*) 39 - 117 U/L   Total Bilirubin 0.3  0.3 - 1.2 mg/dL   GFR calc non Af Amer 84 (*) >90 mL/min   GFR calc Af Amer >90  >90 mL/min  POCT I-STAT TROPONIN I     Status: None   Collection Time    05/18/12  1:11 AM      Result Value Range   Troponin i, poc 0.00  0.00 - 0.08 ng/mL   Comment 3            EKG: nsr, no acute ischemic changes, normal intervals, normal axis, normal qrs complex  Dg Chest 2 View  05/18/2012  *RADIOLOGY REPORT*  Clinical Data: Complaint of abdominal pain, chest  pain, cough tonight.  CHEST - 2 VIEW  Comparison: 01/17/2012  Findings: The heart size and pulmonary vascularity are normal. The lungs appear clear and expanded without focal air space disease or consolidation. No blunting of the costophrenic angles.  No pneumothorax.  Mediastinal contours appear intact.  Cervical ribs. Right upper quadrant surgical clips.  No significant change since previous study.  IMPRESSION: No evidence of active pulmonary disease.   Original Report Authenticated By: Burman Nieves, M.D.         MDM  Emergency department workup is nondiagnostic. However, in light of the patient's complaint of new-onset chest pain this afternoon and risk factors for coronary artery disease, I believe that she will be best served by admission for a full rule out and functional study. The patient has been treated with aspirin and MS and is currently pain free. We will tx with BB.  Case discussed with FP resident  who has accepted the patient for admission.         Brandt Loosen, MD 05/18/12 [phone]

## 2012-05-20 NOTE — Discharge Summary (Signed)
Family Medicine Teaching [facility] Discharge Summary  Patient name: Maureen [name] Medical record number: [id number] Date of birth: July 17, 1970 Age: 42 y.o. Gender: female Date of Admission: 05/17/2012  Date of Discharge: 05/18/2012 Admitting Physician: Leighton Roach McDiarmid, MD  Primary Care Provider: Elvina Sidle, MD  Indication for Hospitalization: Chest pain Discharge Diagnoses:  1. Chest pain, non cardiac in origin 2. T1Dm 3. R calf pain 4. Chronic abdominal  5. Fibromyalgia 6. PTSD  Consultations: None  Significant Labs and Imaging:   Recent Labs  Lab  05/18/12 0640   TROPONINI  <0.30     Recent Labs  Lab  05/16/12 2252  05/18/12 0040  05/18/12 0640   HGB  14.3  14.8  15.0   HCT  39.5  40.5  41.5   WBC  16.2*  13.0*  13.7*   PLT  367  256  341     Recent Labs  Lab  05/16/12 2252  05/18/12 0040  05/18/12 0640   NA  136  136  --   K  3.8  3.7  --   CL  94*  99  --   CO2  27  26  --   GLUCOSE  362*  190*  --   BUN  12  8  --   CREATININE  0.93  0.86  --   CALCIUM  9.9  9.6  --   MG  --  --  2.1     Recent Labs  Lab  05/16/12 2252  05/18/12 0040   AST  14  22   ALT  18  16   ALKPHOS  157*  145*   BILITOT  0.5  0.3   PROT  8.5*  7.8   ALBUMIN  3.9  3.4*     Recent Labs  Lab  05/17/12 0200  05/18/12 0621  05/18/12 0728  05/18/12 1129  05/18/12 1707   GLUCAP  215*  76  89  147*  274*    Lipase 35 (11-59)  D Dimer 0.40 (0-0.48)  DG chest 2 view 05/17/2012  IMPRESSION:  No evidence of active pulmonary disease.   Procedures: None  Brief Hospital Course:  Maureen [name] is a 42 y.o. year old female with PMHx of T1DM, HLD, fibromyalgia, chronic abd pain, and PTSD presenting with Chest pain for 7 hours. Ruled out for ACS after inpatient work-up.   Chest pain  Her chest pain began after eating, relieved with lying backk and worsened with sitting up, and lasted for hours. With radiation to the L shoulder and pressure like  character she had typical and atypical features. She has risk factors of a mother with an MI at age 35, HLD, and DM1. She was admitted to tele, cardiac enzymes cycled, and follow up ekg ordered. She ruled out for ACS with negative enzymes and stable EKG. She was advised to contact her cardiologist for stress test and follow up with her PCP. Additional risk stratification labs and preventative meds are detailed below.   After work up we feel that and esophageal or gastric cause is likely to blame, considering PUD, esophageal spasm, GERD with esophagitis, gastroperesis. We started her on a 8 week coarse of PPI.  - Fasting lipids TChol 276, LDL 179, HDL 68  - A1C 8.5  - TSH 5.2  - D Dimer 0.4  - Lipase 35  - Start daily aspirin and lipitor 40   T1DM  She was managed inpatient with a slightly  reduced insulin from her home doses, which was restarted on DC. Her A1C was found to be 8.5.   R calf pain  Grossly larger on exam so a D dimer was drawn that was WNL. ALso a venous duplex was performed prior to dc which was preliminarily negative.   Chronic Abdominal pain  Benign abdominal exam, no acute interventions required. Started Protonix as explained above.   Fibromyalgia  We continued he home vicoden and lyrica.   PTSD - No acute treatments   Discharge Medications:    Medication List    STOP taking these medications       fentaNYL 25 MCG/HR  Commonly known as:  DURAGESIC - dosed mcg/hr      TAKE these medications       acetaminophen 500 MG tablet  Commonly known as:  TYLENOL  Take 1,000 mg by mouth every 6 (six) hours as needed for pain.     aspirin 81 MG EC tablet  Take 1 tablet (81 mg total) by mouth daily.     atorvastatin 40 MG tablet  Commonly known as:  LIPITOR  Take 1 tablet (40 mg total) by mouth daily at 6 PM.     HYDROcodone-acetaminophen 5-325 MG per tablet  Commonly known as:  NORCO/VICODIN  Take 2 tablets by mouth every 4 (four) hours as needed for pain.      insulin aspart 100 UNIT/ML injection  Commonly known as:  novoLOG  Inject 8 Units into the skin 3 (three) times daily before meals.     insulin glargine 100 UNIT/ML injection  Commonly known as:  LANTUS  Inject 28 Units into the skin at bedtime.     pantoprazole 40 MG tablet  Commonly known as:  PROTONIX  Take 1 tablet (40 mg total) by mouth daily at 6 (six) AM.     pregabalin 25 MG capsule  Commonly known as:  LYRICA  Take 25 mg by mouth 2 (two) times daily.     traZODone 50 MG tablet  Commonly known as:  DESYREL  Take 50 mg by mouth at bedtime.       Issues for Follow Up:  - Consider cardiac work up with stress test considering her risk factors  - Consider gastric emptying study to look for diabetic gastroperesis  Outstanding Results: None  Discharge Instructions: Please refer to Patient Instructions section of EMR for full details.  Patient was counseled important signs and symptoms that should prompt return to medical care, changes in medications, dietary instructions, activity restrictions, and follow up appointments.       Follow-up Information   Follow up with Elvina Sidle, MD. (call today or tomorrow for appointment in 1 week for hospital follow up)    Contact information:   [address] [place] [address] [phone]       Follow up with [facility]. (Call for appt when available for hospital follow-up)    Contact information:   [address] [place] [address]       Discharge Condition: Carlena Sax, MD 05/20/2012, 3:12 PM

## 2012-05-26 ENCOUNTER — Encounter (HOSPITAL_COMMUNITY): Payer: Self-pay | Admitting: Emergency Medicine

## 2012-05-26 ENCOUNTER — Emergency Department (HOSPITAL_COMMUNITY): Payer: Managed Care, Other (non HMO)

## 2012-05-26 ENCOUNTER — Emergency Department (HOSPITAL_COMMUNITY)
Admission: EM | Admit: 2012-05-26 | Discharge: 2012-05-26 | Disposition: A | Discharge status: Home / Self Care | Payer: Managed Care, Other (non HMO) | Attending: Emergency Medicine | Admitting: Emergency Medicine

## 2012-05-26 DIAGNOSIS — Z7982 Long term (current) use of aspirin: Secondary | ICD-10-CM | POA: Insufficient documentation

## 2012-05-26 DIAGNOSIS — E119 Type 2 diabetes mellitus without complications: Secondary | ICD-10-CM | POA: Insufficient documentation

## 2012-05-26 DIAGNOSIS — Z794 Long term (current) use of insulin: Secondary | ICD-10-CM | POA: Insufficient documentation

## 2012-05-26 DIAGNOSIS — Z8679 Personal history of other diseases of the circulatory system: Secondary | ICD-10-CM | POA: Insufficient documentation

## 2012-05-26 DIAGNOSIS — Z8639 Personal history of other endocrine, nutritional and metabolic disease: Secondary | ICD-10-CM | POA: Insufficient documentation

## 2012-05-26 DIAGNOSIS — Z8739 Personal history of other diseases of the musculoskeletal system and connective tissue: Secondary | ICD-10-CM | POA: Insufficient documentation

## 2012-05-26 DIAGNOSIS — Z3202 Encounter for pregnancy test, result negative: Secondary | ICD-10-CM | POA: Insufficient documentation

## 2012-05-26 DIAGNOSIS — R1084 Generalized abdominal pain: Secondary | ICD-10-CM | POA: Insufficient documentation

## 2012-05-26 DIAGNOSIS — Z79899 Other long term (current) drug therapy: Secondary | ICD-10-CM | POA: Insufficient documentation

## 2012-05-26 DIAGNOSIS — E785 Hyperlipidemia, unspecified: Secondary | ICD-10-CM | POA: Insufficient documentation

## 2012-05-26 DIAGNOSIS — R109 Unspecified abdominal pain: Secondary | ICD-10-CM

## 2012-05-26 DIAGNOSIS — Z862 Personal history of diseases of the blood and blood-forming organs and certain disorders involving the immune mechanism: Secondary | ICD-10-CM | POA: Insufficient documentation

## 2012-05-26 LAB — CBC WITH DIFFERENTIAL/PLATELET
Basophils Absolute: 0 10*3/uL (ref 0.0–0.1)
Basophils Relative: 0 % (ref 0–1)
Eosinophils Absolute: 0.3 10*3/uL (ref 0.0–0.7)
Eosinophils Relative: 2 % (ref 0–5)
HCT: 41 % (ref 36.0–46.0)
Hemoglobin: 14.2 g/dL (ref 12.0–15.0)
Lymphocytes Relative: 24 % (ref 12–46)
Lymphs Abs: 3.1 10*3/uL (ref 0.7–4.0)
MCH: 32.1 pg (ref 26.0–34.0)
MCHC: 34.6 g/dL (ref 30.0–36.0)
MCV: 92.6 fL (ref 78.0–100.0)
Monocytes Absolute: 1 10*3/uL (ref 0.1–1.0)
Monocytes Relative: 8 % (ref 3–12)
Neutro Abs: 8.5 10*3/uL — ABNORMAL HIGH (ref 1.7–7.7)
Neutrophils Relative %: 66 % (ref 43–77)
Platelets: 408 10*3/uL — ABNORMAL HIGH (ref 150–400)
RBC: 4.43 MIL/uL (ref 3.87–5.11)
RDW: 12.1 % (ref 11.5–15.5)
WBC: 13 10*3/uL — ABNORMAL HIGH (ref 4.0–10.5)

## 2012-05-26 LAB — COMPREHENSIVE METABOLIC PANEL
ALT: 38 U/L — ABNORMAL HIGH (ref 0–35)
AST: 24 U/L (ref 0–37)
Albumin: 3.1 g/dL — ABNORMAL LOW (ref 3.5–5.2)
Alkaline Phosphatase: 187 U/L — ABNORMAL HIGH (ref 39–117)
BUN: 7 mg/dL (ref 6–23)
CO2: 28 mEq/L (ref 19–32)
Calcium: 9.6 mg/dL (ref 8.4–10.5)
Chloride: 101 mEq/L (ref 96–112)
Creatinine, Ser: 0.75 mg/dL (ref 0.50–1.10)
GFR calc Af Amer: >90 mL/min (ref >90)
GFR calc non Af Amer: >90 mL/min (ref >90)
Glucose, Bld: 90 mg/dL (ref 70–99)
Potassium: 3.5 mEq/L (ref 3.5–5.1)
Sodium: 139 mEq/L (ref 135–145)
Total Bilirubin: 0.2 mg/dL — ABNORMAL LOW (ref 0.3–1.2)
Total Protein: 7.7 g/dL (ref 6.0–8.3)

## 2012-05-26 LAB — URINALYSIS, ROUTINE W REFLEX MICROSCOPIC
Bilirubin Urine: NEGATIVE
Glucose, UA: NEGATIVE mg/dL
Hgb urine dipstick: NEGATIVE
Ketones, ur: NEGATIVE mg/dL
Leukocytes, UA: NEGATIVE
Nitrite: NEGATIVE
Protein, ur: NEGATIVE mg/dL
Specific Gravity, Urine: 1.01 (ref 1.005–1.030)
Urobilinogen, UA: 0.2 mg/dL (ref 0.0–1.0)
pH: 8 (ref 5.0–8.0)

## 2012-05-26 LAB — POCT PREGNANCY, URINE: Preg Test, Ur: NEGATIVE

## 2012-05-26 LAB — LIPASE, BLOOD: Lipase: 10 U/L — ABNORMAL LOW (ref 11–59)

## 2012-05-26 MED ORDER — HYDROCODONE-ACETAMINOPHEN 5-325 MG PO TABS: 1.0000 | ORAL_TABLET | Freq: Four times a day (QID) | ORAL | Status: DC | PRN

## 2012-05-26 MED ORDER — HYDROMORPHONE HCL PF 1 MG/ML IJ SOLN
0.5000 mg | Freq: Once | INTRAMUSCULAR | Status: DC
  Filled 2012-05-26: qty 1

## 2012-05-26 MED ORDER — HYDROMORPHONE HCL PF 1 MG/ML IJ SOLN
0.5000 mg | Freq: Once | INTRAMUSCULAR | Status: AC
  Administered 2012-05-26: 0.5 mg via INTRAMUSCULAR

## 2012-05-26 NOTE — ED Notes (Signed)
Dr. Bonk at bedside 

## 2012-05-26 NOTE — ED Notes (Signed)
Patient transported to X-ray 

## 2012-05-26 NOTE — ED Notes (Signed)
Pt c/o RUQ pain radiating to RLQ and R flank onset last night. Denies urinary s/s, denies n/v/d.

## 2012-05-26 NOTE — ED Notes (Signed)
Pt denies any urinary symptoms.

## 2012-05-26 NOTE — ED Provider Notes (Signed)
History     CSN: [id number]  Arrival date & time 05/26/12  0109   First MD Initiated Contact with Patient 05/26/12 [phone]      Chief Complaint  Patient presents with  . Abdominal Pain    HPI Maureen [name] is a 42 y.o. female is a patient with chronic abdominal pain, diabetes, fibromyalgia, history of biliary dyskinesia status post cholecystectomy presents with 8/10 pain in the right upper quadrant it radiates to the right lower quadrant (status post appendectomy) been intermittent, turning over makes it better, she has not taken anything for it, she denies any nausea vomiting diarrhea, no shaking chills, no fever, no stiff neck, muscle aches or rash. She does describe associated throbbing mild headache. She denies any vaginal discharge, dysuria or frequency.   Past Medical History  Diagnosis Date  . Diabetes mellitus   . Fibromyalgia   . Arthritis   . Hyperlipidemia   . Chills   . Abdominal pain   . Nausea & vomiting   . Biliary dyskinesia   . Depression   . Migraine   . Joint pain   . Skin rash     due to medications  . Post traumatic stress disorder (PTSD)   . Sleep trouble   . Major depressive disorder   . Cervical strain   . Closed head injury     Past Surgical History  Procedure Laterality Date  . Tonsillectomy  2001  . Lipoma removed    . Knee surgery    . Cesarean section  1997, 1999  . Abdominal hysterectomy  2001  . Tumor removal  2000    lower back  . Eye surgery  2006    laser eye surgery left eye  . Cholecystectomy  02/10/2011    Procedure: LAPAROSCOPIC CHOLECYSTECTOMY WITH INTRAOPERATIVE CHOLANGIOGRAM;  Surgeon: Rulon Abide, DO;  Location: [facility] OR;  Service: General;  Laterality: N/A;  . Robotic assisted laparoscopic lysis of adhesion N/A 03/04/2012    Procedure: ROBOTIC ASSISTED LAPAROSCOPIC LYSIS OF EXTENSIVE ADHESIONS;  Surgeon: Tresa Endo A. Ernestina Penna, MD;  Location: [facility];  Service: Gynecology;  Laterality: N/A;    Family History  Problem  Relation Age of Onset  . Cancer Father     lymphoma  . Nephrolithiasis Father   . Vascular Disease Father   . Cancer Maternal Grandmother     colon  . Hypertension Mother   . Heart disease Mother   . Cataracts Mother   . Diabetes Brother   . Mental illness Maternal Grandfather   . Cancer Maternal Grandfather   . Heart disease Paternal Grandmother   . Heart disease Paternal Grandfather     History  Substance Use Topics  . Smoking status: Never Smoker   . Smokeless tobacco: Never Used  . Alcohol Use: No    OB History   Grav Para Term Preterm Abortions TAB SAB Ect Mult Living                  Review of Systems At least 10pt or greater review of systems completed and are negative except where specified in the HPI.  Allergies  Meloxicam; Cephalexin; and Ibuprofen  Home Medications   Current Outpatient Rx  Name  Route  Sig  Dispense  Refill  . acetaminophen (TYLENOL) 500 MG tablet   Oral   Take 1,000 mg by mouth every 6 (six) hours as needed for pain.         Marland Kitchen aspirin EC 81 MG EC  tablet   Oral   Take 1 tablet (81 mg total) by mouth daily.   30 tablet   3   . atorvastatin (LIPITOR) 40 MG tablet   Oral   Take 1 tablet (40 mg total) by mouth daily at 6 PM.   30 tablet   3   . insulin aspart (NOVOLOG) 100 UNIT/ML injection   Subcutaneous   Inject 8 Units into the skin 3 (three) times daily before meals.         . insulin glargine (LANTUS) 100 UNIT/ML injection   Subcutaneous   Inject 28 Units into the skin at bedtime.          . pantoprazole (PROTONIX) 40 MG tablet   Oral   Take 1 tablet (40 mg total) by mouth daily at 6 (six) AM.   30 tablet   1   . pregabalin (LYRICA) 25 MG capsule   Oral   Take 25 mg by mouth 2 (two) times daily.         . traZODone (DESYREL) 50 MG tablet   Oral   Take 50 mg by mouth at bedtime.           BP 167/71  Pulse 94  Temp(Src) 98.7 F (37.1 C) (Oral)  Resp 17  Ht 5\' 4"  (1.626 m)  Wt 180 lb (81.647 kg)   BMI 30.88 kg/m2  SpO2 100%  Physical Exam  Nursing notes reviewed.  Electronic medical record reviewed. VITAL SIGNS:   Filed Vitals:   05/26/12 0128 05/26/12 0548 05/26/12 0712  BP: 167/71 144/66 145/74  Pulse: 94 87 77  Temp: 98.7 F (37.1 C) 98.4 F (36.9 C)   TempSrc: Oral Oral   Resp: 17 18 18   Height: 5\' 4"  (1.626 m)    Weight: 180 lb (81.647 kg)    SpO2: 100% 100% 98%   CONSTITUTIONAL: Awake, oriented, appears non-toxic HENT: Atraumatic, normocephalic, oral mucosa pink and moist, airway patent. Nares patent without drainage. External ears normal. EYES: Conjunctiva clear, EOMI, PERRLA NECK: Trachea midline, non-tender, supple CARDIOVASCULAR: Normal heart rate, Normal rhythm, No murmurs, rubs, gallops PULMONARY/CHEST: Clear to auscultation, no rhonchi, wheezes, or rales. Symmetrical breath sounds. Non-tender. ABDOMINAL: Non-distended, soft, non-tender - no rebound or guarding.  BS normal. NEUROLOGIC: Non-focal, moving all four extremities, no gross sensory or motor deficits. EXTREMITIES: No clubbing, cyanosis, or edema SKIN: Warm, Dry, No erythema, No rash  ED Course  Procedures (including critical care time)  Labs Reviewed  CBC WITH DIFFERENTIAL - Abnormal; Notable for the following:    WBC 13.0 (*)    Platelets 408 (*)    Neutro Abs 8.5 (*)    All other components within normal limits  COMPREHENSIVE METABOLIC PANEL - Abnormal; Notable for the following:    Albumin 3.1 (*)    ALT 38 (*)    Alkaline Phosphatase 187 (*)    Total Bilirubin 0.2 (*)    All other components within normal limits  LIPASE, BLOOD - Abnormal; Notable for the following:    Lipase 10 (*)    All other components within normal limits  URINALYSIS, ROUTINE W REFLEX MICROSCOPIC  POCT PREGNANCY, URINE   Dg Abd 2 Views  05/26/2012  *RADIOLOGY REPORT*  Clinical Data: 41 year old female with increasing right flank pain.  ABDOMEN - 2 VIEW  Comparison: CT abdomen and pelvis 01/18/2012 and earlier.   Findings: Stable right upper quadrant surgical clips.  Lung bases appear negative.  No pneumoperitoneum. Nonobstructed bowel gas pattern.  Retained  stool throughout much of the colon. No acute osseous abnormality identified.  IMPRESSION: Nonobstructed bowel gas pattern, no free air.   Original Report Authenticated By: Erskine Speed, M.D.      1. Right sided abdominal pain     Medications  HYDROmorphone (DILAUDID) injection 0.5 mg (0.5 mg Intramuscular Given 05/26/12 0544)     MDM  Patient presents with right-sided abdominal pain. Patient is status post cholecystectomy and appendectomy.  Pain is diffuse, she is nontoxic, afebrile, and initially refused pain medicines.  Patient is concerned because this pain has been different than her typical chronic abdominal pains. Labs are unremarkable, and x-ray of the patient's abdomen shows a nonobstructive bowel gas pattern are quite a bit of stool in the colon.  Patient's pain may be due to constipation.  Do not think there is any surgical emergency at this time.       Jones Skene, MD 05/27/12 [phone]

## 2012-07-14 ENCOUNTER — Ambulatory Visit (INDEPENDENT_AMBULATORY_CARE_PROVIDER_SITE_OTHER): Payer: Managed Care, Other (non HMO) | Admitting: Family Medicine

## 2012-07-14 VITALS — BP 130/78 | HR 91 | Temp 97.9°F | Resp 18 | Ht 64.5 in | Wt 184.0 lb

## 2012-07-14 DIAGNOSIS — M79609 Pain in unspecified limb: Secondary | ICD-10-CM

## 2012-07-14 DIAGNOSIS — M79604 Pain in right leg: Secondary | ICD-10-CM

## 2012-07-14 DIAGNOSIS — E109 Type 1 diabetes mellitus without complications: Secondary | ICD-10-CM

## 2012-07-14 LAB — D-DIMER, QUANTITATIVE: D-Dimer, Quant: 0.33 ug/mL-FEU (ref 0.00–0.48)

## 2012-07-14 MED ORDER — METHOCARBAMOL 500 MG PO TABS: ORAL_TABLET | ORAL | Status: DC

## 2012-07-14 MED ORDER — TRAMADOL HCL 50 MG PO TABS: 50.0000 mg | ORAL_TABLET | Freq: Three times a day (TID) | ORAL | Status: DC | PRN

## 2012-07-14 NOTE — Patient Instructions (Signed)
Try to rest your legs the next couple of days  Take the muscle relaxant  I will let you know if the d-dimer shows anything of concern.  Return if worse

## 2012-07-14 NOTE — Progress Notes (Signed)
Subjective 42 year old lady who is a type I diabetic. She has been having problems also off and on with leg pains. She was in the emergency room and had a ultrasound of her right leg about 2 months ago. She has not had clots. Yesterday and today she's been hurting in the calves of both legs. Worse on the left than the right. She had a hard time resting last night due to the pain. Her sugars have been variable, but she's been having more very low blood sugars recently. Today it was down to 45. She did not pass out but she ate something and brought back up. She is not a complainer. She does exercise, walking a good deal. More this week.  Objective: Pleasant lady in no major distress. Abdomen soft without masses or tenderness. Still has the chronic abdominal pain on the right side. Straight leg raising test negative. Nose mildly tender left worse than the right. Left felt a little tighter than the right. She has no edema however. No warmth to it. Pedal pulses are adequate. Homans sign negative.  Assessment: Bilateral leg pain, left worse than right Type 1 diabetes  Plan: Although it is probably not a blood clot, we need to be certain. Will check a d-dimer rather than getting an ultrasound yet.

## 2012-07-18 ENCOUNTER — Emergency Department (HOSPITAL_COMMUNITY)
Admission: EM | Admit: 2012-07-18 | Discharge: 2012-07-19 | Disposition: A | Discharge status: Home / Self Care | Payer: Managed Care, Other (non HMO) | Attending: Emergency Medicine | Admitting: Emergency Medicine

## 2012-07-18 ENCOUNTER — Encounter (HOSPITAL_COMMUNITY): Payer: Self-pay | Admitting: Emergency Medicine

## 2012-07-18 ENCOUNTER — Emergency Department (HOSPITAL_COMMUNITY)
Admission: EM | Admit: 2012-07-18 | Discharge: 2012-07-18 | Disposition: A | Discharge status: Nursing Home (Includes ICF) | Payer: Managed Care, Other (non HMO) | Source: Home / Self Care | Attending: Emergency Medicine | Admitting: Emergency Medicine

## 2012-07-18 ENCOUNTER — Encounter (HOSPITAL_COMMUNITY): Payer: Self-pay | Admitting: *Deleted

## 2012-07-18 DIAGNOSIS — M129 Arthropathy, unspecified: Secondary | ICD-10-CM | POA: Insufficient documentation

## 2012-07-18 DIAGNOSIS — Z888 Allergy status to other drugs, medicaments and biological substances status: Secondary | ICD-10-CM | POA: Insufficient documentation

## 2012-07-18 DIAGNOSIS — R6883 Chills (without fever): Secondary | ICD-10-CM | POA: Insufficient documentation

## 2012-07-18 DIAGNOSIS — E785 Hyperlipidemia, unspecified: Secondary | ICD-10-CM | POA: Insufficient documentation

## 2012-07-18 DIAGNOSIS — F329 Major depressive disorder, single episode, unspecified: Secondary | ICD-10-CM | POA: Insufficient documentation

## 2012-07-18 DIAGNOSIS — G479 Sleep disorder, unspecified: Secondary | ICD-10-CM | POA: Insufficient documentation

## 2012-07-18 DIAGNOSIS — R197 Diarrhea, unspecified: Secondary | ICD-10-CM | POA: Insufficient documentation

## 2012-07-18 DIAGNOSIS — R109 Unspecified abdominal pain: Secondary | ICD-10-CM

## 2012-07-18 DIAGNOSIS — R110 Nausea: Secondary | ICD-10-CM | POA: Insufficient documentation

## 2012-07-18 DIAGNOSIS — Z8719 Personal history of other diseases of the digestive system: Secondary | ICD-10-CM | POA: Insufficient documentation

## 2012-07-18 DIAGNOSIS — D72829 Elevated white blood cell count, unspecified: Secondary | ICD-10-CM | POA: Insufficient documentation

## 2012-07-18 DIAGNOSIS — Z7982 Long term (current) use of aspirin: Secondary | ICD-10-CM | POA: Insufficient documentation

## 2012-07-18 DIAGNOSIS — IMO0001 Reserved for inherently not codable concepts without codable children: Secondary | ICD-10-CM | POA: Insufficient documentation

## 2012-07-18 DIAGNOSIS — Z881 Allergy status to other antibiotic agents status: Secondary | ICD-10-CM | POA: Insufficient documentation

## 2012-07-18 DIAGNOSIS — E119 Type 2 diabetes mellitus without complications: Secondary | ICD-10-CM | POA: Insufficient documentation

## 2012-07-18 DIAGNOSIS — F4310 Post-traumatic stress disorder, unspecified: Secondary | ICD-10-CM | POA: Insufficient documentation

## 2012-07-18 DIAGNOSIS — Z8679 Personal history of other diseases of the circulatory system: Secondary | ICD-10-CM | POA: Insufficient documentation

## 2012-07-18 DIAGNOSIS — Z87442 Personal history of urinary calculi: Secondary | ICD-10-CM | POA: Insufficient documentation

## 2012-07-18 DIAGNOSIS — Z79899 Other long term (current) drug therapy: Secondary | ICD-10-CM | POA: Insufficient documentation

## 2012-07-18 DIAGNOSIS — Z794 Long term (current) use of insulin: Secondary | ICD-10-CM | POA: Insufficient documentation

## 2012-07-18 HISTORY — DX: Calculus of kidney: N20.0

## 2012-07-18 LAB — COMPREHENSIVE METABOLIC PANEL
ALT: 12 U/L (ref 0–35)
AST: 16 U/L (ref 0–37)
Albumin: 3.8 g/dL (ref 3.5–5.2)
Alkaline Phosphatase: 159 U/L — ABNORMAL HIGH (ref 39–117)
BUN: 10 mg/dL (ref 6–23)
CO2: 29 mEq/L (ref 19–32)
Calcium: 9.8 mg/dL (ref 8.4–10.5)
Chloride: 97 mEq/L (ref 96–112)
Creatinine, Ser: 0.88 mg/dL (ref 0.50–1.10)
GFR calc Af Amer: >90 mL/min (ref >90)
GFR calc non Af Amer: 82 mL/min — ABNORMAL LOW (ref >90)
Glucose, Bld: 270 mg/dL — ABNORMAL HIGH (ref 70–99)
Potassium: 4 mEq/L (ref 3.5–5.1)
Sodium: 136 mEq/L (ref 135–145)
Total Bilirubin: 0.6 mg/dL (ref 0.3–1.2)
Total Protein: 8.4 g/dL — ABNORMAL HIGH (ref 6.0–8.3)

## 2012-07-18 LAB — CBC WITH DIFFERENTIAL/PLATELET
Basophils Absolute: 0.1 10*3/uL (ref 0.0–0.1)
Basophils Relative: 0 % (ref 0–1)
Eosinophils Absolute: 0.1 10*3/uL (ref 0.0–0.7)
Eosinophils Relative: 1 % (ref 0–5)
HCT: 41.1 % (ref 36.0–46.0)
Hemoglobin: 14.8 g/dL (ref 12.0–15.0)
Lymphocytes Relative: 19 % (ref 12–46)
Lymphs Abs: 2.9 10*3/uL (ref 0.7–4.0)
MCH: 32.3 pg (ref 26.0–34.0)
MCHC: 36 g/dL (ref 30.0–36.0)
MCV: 89.7 fL (ref 78.0–100.0)
Monocytes Absolute: 0.8 10*3/uL (ref 0.1–1.0)
Monocytes Relative: 5 % (ref 3–12)
Neutro Abs: 11.4 10*3/uL — ABNORMAL HIGH (ref 1.7–7.7)
Neutrophils Relative %: 75 % (ref 43–77)
Platelets: 456 10*3/uL — ABNORMAL HIGH (ref 150–400)
RBC: 4.58 MIL/uL (ref 3.87–5.11)
RDW: 12.1 % (ref 11.5–15.5)
WBC: 15.3 10*3/uL — ABNORMAL HIGH (ref 4.0–10.5)

## 2012-07-18 LAB — URINALYSIS, ROUTINE W REFLEX MICROSCOPIC
Bilirubin Urine: NEGATIVE
Glucose, UA: >1000 mg/dL — AB
Ketones, ur: NEGATIVE mg/dL
Leukocytes, UA: NEGATIVE
Nitrite: NEGATIVE
Protein, ur: 100 mg/dL — AB
Specific Gravity, Urine: 1.03 (ref 1.005–1.030)
Urobilinogen, UA: 0.2 mg/dL (ref 0.0–1.0)
pH: 5.5 (ref 5.0–8.0)

## 2012-07-18 LAB — URINE MICROSCOPIC-ADD ON

## 2012-07-18 NOTE — ED Provider Notes (Signed)
Medical screening examination/treatment/procedure(s) were performed by non-physician practitioner and as supervising physician I was immediately available for consultation/collaboration.  Leslee Home, M.D.  Reuben Likes, MD 07/18/12 2200

## 2012-07-18 NOTE — ED Provider Notes (Signed)
History    CSN: [id number] Arrival date & time 07/18/12  3557  First MD Initiated Contact with Patient 07/18/12 1958     Chief Complaint  Patient presents with  . Flank Pain   (Consider location/radiation/quality/duration/timing/severity/associated sxs/prior Treatment) HPI Comments: 42 year old female presents complaining of abdominal pain in the right mid to upper quadrant as well as right flank pain that started this morning. She has a history of chronic abdominal pain but she states this is much worse than it has ever been before. Additionally, she has subjective fever and chills as well as nausea and diarrhea.  She also has a history of kidney stones but she says this pain in the flank is also much worse than any kidney stone pain she has experienced previously. she is worried because she states she has never had abdominal pain this bad before. Denies vomiting, hematuria. She has not been able to eat all day because of the pain and nausea. Her blood sugar was 254 prior to arrival.  Patient is status post cholecystectomy but not status post appendectomy as a previous note  indicated. She had an exploratory laparoscopy and lysis of adhesions  in March of 2014. At this time she was noted that she had an inflamed appendix. She was referred to general surgeon who apparently has refused to remove the appendix.   Patient is a 42 y.o. female presenting with flank pain.  Flank Pain Associated symptoms include abdominal pain. Pertinent negatives include no chest pain and no shortness of breath.   Past Medical History  Diagnosis Date  . Diabetes mellitus   . Fibromyalgia   . Arthritis   . Hyperlipidemia   . Chills   . Abdominal pain   . Nausea & vomiting   . Biliary dyskinesia   . Depression   . Migraine   . Joint pain   . Skin rash     due to medications  . Post traumatic stress disorder (PTSD)   . Sleep trouble   . Major depressive disorder   . Cervical strain   . Closed head  injury   . Renal stones    Past Surgical History  Procedure Laterality Date  . Tonsillectomy  2001  . Lipoma removed    . Knee surgery    . Cesarean section  1997, 1999  . Abdominal hysterectomy  2001  . Tumor removal  2000    lower back  . Eye surgery  2006    laser eye surgery left eye  . Cholecystectomy  02/10/2011    Procedure: LAPAROSCOPIC CHOLECYSTECTOMY WITH INTRAOPERATIVE CHOLANGIOGRAM;  Surgeon: Rulon Abide, DO;  Location: [facility] OR;  Service: General;  Laterality: N/A;  . Robotic assisted laparoscopic lysis of adhesion N/A 03/04/2012    Procedure: ROBOTIC ASSISTED LAPAROSCOPIC LYSIS OF EXTENSIVE ADHESIONS;  Surgeon: Tresa Endo A. Ernestina Penna, MD;  Location: [facility];  Service: Gynecology;  Laterality: N/A;   Family History  Problem Relation Age of Onset  . Cancer Father     lymphoma  . Nephrolithiasis Father   . Vascular Disease Father   . Cancer Maternal Grandmother     colon  . Hypertension Mother   . Heart disease Mother   . Cataracts Mother   . Diabetes Brother   . Mental illness Maternal Grandfather   . Cancer Maternal Grandfather   . Heart disease Paternal Grandmother   . Heart disease Paternal Grandfather    History  Substance Use Topics  . Smoking status: Never Smoker   .  Smokeless tobacco: Never Used  . Alcohol Use: No   OB History   Grav Para Term Preterm Abortions TAB SAB Ect Mult Living                 Review of Systems  Constitutional: Positive for appetite change. Negative for fever and chills.  Eyes: Negative for visual disturbance.  Respiratory: Negative for cough and shortness of breath.   Cardiovascular: Negative for chest pain, palpitations and leg swelling.  Gastrointestinal: Positive for nausea, abdominal pain and diarrhea. Negative for vomiting and constipation.  Endocrine: Negative for polydipsia and polyuria.  Genitourinary: Positive for flank pain. Negative for dysuria, urgency and frequency.  Musculoskeletal: Negative for myalgias and  arthralgias.  Skin: Negative for rash.  Neurological: Negative for dizziness, weakness and light-headedness.    Allergies  Meloxicam; Cephalexin; and Ibuprofen  Home Medications   Current Outpatient Rx  Name  Route  Sig  Dispense  Refill  . acetaminophen (TYLENOL) 500 MG tablet   Oral   Take 1,000 mg by mouth every 6 (six) hours as needed for pain.         Marland Kitchen aspirin EC 81 MG EC tablet   Oral   Take 1 tablet (81 mg total) by mouth daily.   30 tablet   3   . atorvastatin (LIPITOR) 40 MG tablet   Oral   Take 1 tablet (40 mg total) by mouth daily at 6 PM.   30 tablet   3   . insulin aspart (NOVOLOG) 100 UNIT/ML injection   Subcutaneous   Inject 8 Units into the skin 3 (three) times daily before meals.         . insulin glargine (LANTUS) 100 UNIT/ML injection   Subcutaneous   Inject 28 Units into the skin at bedtime.          . pantoprazole (PROTONIX) 40 MG tablet   Oral   Take 1 tablet (40 mg total) by mouth daily at 6 (six) AM.   30 tablet   1    BP 135/75  Pulse 89  Temp(Src) 98.6 F (37 C) (Oral)  Resp 16  SpO2 100% Physical Exam  Nursing note and vitals reviewed. Constitutional: She is oriented to person, place, and time. Vital signs are normal. She appears well-developed and well-nourished. No distress.  HENT:  Head: Atraumatic.  Eyes: EOM are normal. Pupils are equal, round, and reactive to light.  Cardiovascular: Normal rate, regular rhythm and normal heart sounds.  Exam reveals no gallop and no friction rub.   No murmur heard. Pulmonary/Chest: Effort normal and breath sounds normal. No respiratory distress. She has no wheezes. She has no rales.  Abdominal: Soft. There is tenderness in the right upper quadrant and right lower quadrant. There is CVA tenderness (right sided), tenderness at McBurney's point and positive Murphy's sign. There is no rigidity and no guarding.  Neurological: She is alert and oriented to person, place, and time. She has  normal strength.  Skin: Skin is warm and dry. She is not diaphoretic.  Psychiatric: She has a normal mood and affect. Her behavior is normal. Judgment normal.    ED Course  Procedures (including critical care time) Labs Reviewed - No data to display No results found. 1. Abdominal pain   2. Flank pain     MDM  Patient sent to the ER for further workup of her abdominal pain. She'll probably need a CT scan  Graylon Good, PA-C 07/18/12 2110  Adrian Blackwater  Marvell Tamer, PA-C 07/18/12 2123

## 2012-07-18 NOTE — ED Notes (Signed)
C/o R flank pain that radiates to her R lower back onset today.  Hx. of inflammed appendix back in Feb. She had surgery Has scar tissue from 2 c-sections and GB removed and had it removed by OB-GYN.  She told her she could not take out her appendix.  C/o nausea.  Had diarrhea x 5 today in small amounts.  No vomiting.  C/o chills.  Hx. of kidney stones.

## 2012-07-18 NOTE — ED Notes (Signed)
Pt. has notified someone by phone of her transfer to the ED.

## 2012-07-18 NOTE — ED Notes (Signed)
PT. TRANSFERRED FROM [facility] , REPORTS RIGHT FLANK PAIN ONSET TODAY WITH NAUSEA , DENIES HEMATURIA /DYSURIA OR  VOMITTING . PT. STATED HISTORY OF KIDNEY STONE.

## 2012-07-19 ENCOUNTER — Encounter (HOSPITAL_COMMUNITY): Payer: Self-pay | Admitting: Radiology

## 2012-07-19 ENCOUNTER — Emergency Department (HOSPITAL_COMMUNITY): Payer: Managed Care, Other (non HMO)

## 2012-07-19 MED ORDER — IOHEXOL 300 MG/ML  SOLN
100.0000 mL | Freq: Once | INTRAMUSCULAR | Status: AC | PRN
  Administered 2012-07-19: 100 mL via INTRAVENOUS

## 2012-07-19 MED ORDER — MORPHINE SULFATE 4 MG/ML IJ SOLN
4.0000 mg | Freq: Once | INTRAMUSCULAR | Status: AC
Start: 2012-07-19 — End: 2012-07-19
  Administered 2012-07-19: 4 mg via INTRAVENOUS
  Filled 2012-07-19: qty 1

## 2012-07-19 MED ORDER — METOCLOPRAMIDE HCL 5 MG/ML IJ SOLN
10.0000 mg | Freq: Once | INTRAMUSCULAR | Status: AC
  Administered 2012-07-19: 10 mg via INTRAVENOUS
  Filled 2012-07-19: qty 2

## 2012-07-19 MED ORDER — IOHEXOL 300 MG/ML  SOLN
50.0000 mL | Freq: Once | INTRAMUSCULAR | Status: AC | PRN
  Administered 2012-07-19: 50 mL via ORAL

## 2012-07-19 MED ORDER — ONDANSETRON 4 MG PO TBDP
8.0000 mg | ORAL_TABLET | ORAL | Status: AC
  Administered 2012-07-19: 8 mg via ORAL
  Filled 2012-07-19: qty 2

## 2012-07-19 MED ORDER — ONDANSETRON 8 MG PO TBDP: ORAL_TABLET | ORAL | Status: DC

## 2012-07-19 NOTE — ED Provider Notes (Signed)
History    CSN: [id number] Arrival date & time 07/18/12  2057  First MD Initiated Contact with Patient 07/19/12 0051     Chief Complaint  Patient presents with  . Flank Pain   HPI  History provided by the patient. Patient is a 42 year old female with history of diabetes, fibromyalgia, cholecystectomy, hysterectomy who presents with complaints of worsened right lower abdomen and side pain. Pain began earlier in the day and has been worsening significantly. Pain is sharp with some waxing and waning. She denies any specific aggravating or alleviating factors. She reports having similar symptoms off and on for the past several months. Patient did have a laparoscopic procedure performed by her OB/GYN who remove adhesions from her prior hysterectomy. She was told during that surgery in February that she her appendix looked enlarged and possibly inflamed. Patient did followup with a general surgeon who did not feel she had any signs of appendicitis. She has continued to have some pains since that time. States pain has never been as worse as it has been today. Pain is associated with some chills at home and nausea. Denies any fever. No episodes of vomiting. She also reports having 5 loose soft bowel movements without blood or mucus. Denies any constipation. Denies any urinary complaints. No vaginal bleeding or discharge. No other associated symptoms.    Past Medical History  Diagnosis Date  . Diabetes mellitus   . Fibromyalgia   . Arthritis   . Hyperlipidemia   . Chills   . Abdominal pain   . Nausea & vomiting   . Biliary dyskinesia   . Depression   . Migraine   . Joint pain   . Skin rash     due to medications  . Post traumatic stress disorder (PTSD)   . Sleep trouble   . Major depressive disorder   . Cervical strain   . Closed head injury   . Renal stones    Past Surgical History  Procedure Laterality Date  . Tonsillectomy  2001  . Lipoma removed    . Knee surgery    . Cesarean  section  1997, 1999  . Abdominal hysterectomy  2001  . Tumor removal  2000    lower back  . Eye surgery  2006    laser eye surgery left eye  . Cholecystectomy  02/10/2011    Procedure: LAPAROSCOPIC CHOLECYSTECTOMY WITH INTRAOPERATIVE CHOLANGIOGRAM;  Surgeon: Rulon Abide, DO;  Location: [facility] OR;  Service: General;  Laterality: N/A;  . Robotic assisted laparoscopic lysis of adhesion N/A 03/04/2012    Procedure: ROBOTIC ASSISTED LAPAROSCOPIC LYSIS OF EXTENSIVE ADHESIONS;  Surgeon: Tresa Endo A. Ernestina Penna, MD;  Location: [facility] ORS;  Service: Gynecology;  Laterality: N/A;   Family History  Problem Relation Age of Onset  . Cancer Father     lymphoma  . Nephrolithiasis Father   . Vascular Disease Father   . Cancer Maternal Grandmother     colon  . Hypertension Mother   . Heart disease Mother   . Cataracts Mother   . Diabetes Brother   . Mental illness Maternal Grandfather   . Cancer Maternal Grandfather   . Heart disease Paternal Grandmother   . Heart disease Paternal Grandfather    History  Substance Use Topics  . Smoking status: Never Smoker   . Smokeless tobacco: Never Used  . Alcohol Use: No   OB History   Grav Para Term Preterm Abortions TAB SAB Ect Mult Living  Review of Systems  Constitutional: Positive for chills. Negative for fever.  Respiratory: Negative for shortness of breath.   Cardiovascular: Negative for chest pain.  Gastrointestinal: Positive for nausea, abdominal pain and diarrhea. Negative for vomiting, constipation and blood in stool.  Genitourinary: Negative for dysuria, frequency, hematuria, flank pain, vaginal bleeding, vaginal discharge and vaginal pain.  All other systems reviewed and are negative.    Allergies  Meloxicam; Cephalexin; and Ibuprofen  Home Medications   Current Outpatient Rx  Name  Route  Sig  Dispense  Refill  . acetaminophen (TYLENOL) 500 MG tablet   Oral   Take 1,000 mg by mouth every 6 (six) hours as needed for  pain.         Marland Kitchen aspirin EC 81 MG EC tablet   Oral   Take 1 tablet (81 mg total) by mouth daily.   30 tablet   3   . atorvastatin (LIPITOR) 40 MG tablet   Oral   Take 1 tablet (40 mg total) by mouth daily at 6 PM.   30 tablet   3   . insulin aspart (NOVOLOG) 100 UNIT/ML injection   Subcutaneous   Inject 8 Units into the skin 3 (three) times daily before meals.         . insulin glargine (LANTUS) 100 UNIT/ML injection   Subcutaneous   Inject 28 Units into the skin at bedtime.          . pantoprazole (PROTONIX) 40 MG tablet   Oral   Take 1 tablet (40 mg total) by mouth daily at 6 (six) AM.   30 tablet   1    BP 155/70  Pulse 77  Temp(Src) 98.2 F (36.8 C) (Oral)  Resp 18  SpO2 100% Physical Exam  Nursing note and vitals reviewed. Constitutional: She is oriented to person, place, and time. She appears well-developed and well-nourished. No distress.  HENT:  Head: Normocephalic.  Cardiovascular: Normal rate and regular rhythm.   Pulmonary/Chest: Effort normal and breath sounds normal. No respiratory distress. She has no wheezes. She has no rales.  Abdominal: Soft. She exhibits no distension. There is tenderness in the right lower quadrant. There is no rigidity, no rebound, no guarding, no tenderness at McBurney's point and negative Murphy's sign.    Musculoskeletal: Normal range of motion.  Neurological: She is alert and oriented to person, place, and time.  Skin: Skin is warm and dry. No rash noted.  Psychiatric: She has a normal mood and affect. Her behavior is normal.    ED Course  Procedures  Results for orders placed during the hospital encounter of 07/18/12  URINALYSIS, ROUTINE W REFLEX MICROSCOPIC      Result Value Range   Color, Urine YELLOW  YELLOW   APPearance CLEAR  CLEAR   Specific Gravity, Urine 1.030  1.005 - 1.030   pH 5.5  5.0 - 8.0   Glucose, UA >1000 (*) NEGATIVE mg/dL   Hgb urine dipstick TRACE (*) NEGATIVE   Bilirubin Urine NEGATIVE   NEGATIVE   Ketones, ur NEGATIVE  NEGATIVE mg/dL   Protein, ur 191 (*) NEGATIVE mg/dL   Urobilinogen, UA 0.2  0.0 - 1.0 mg/dL   Nitrite NEGATIVE  NEGATIVE   Leukocytes, UA NEGATIVE  NEGATIVE  CBC WITH DIFFERENTIAL      Result Value Range   WBC 15.3 (*) 4.0 - 10.5 K/uL   RBC 4.58  3.87 - 5.11 MIL/uL   Hemoglobin 14.8  12.0 - 15.0 g/dL  HCT 41.1  36.0 - 46.0 %   MCV 89.7  78.0 - 100.0 fL   MCH 32.3  26.0 - 34.0 pg   MCHC 36.0  30.0 - 36.0 g/dL   RDW 29.5  62.1 - 30.8 %   Platelets 456 (*) 150 - 400 K/uL   Neutrophils Relative % 75  43 - 77 %   Neutro Abs 11.4 (*) 1.7 - 7.7 K/uL   Lymphocytes Relative 19  12 - 46 %   Lymphs Abs 2.9  0.7 - 4.0 K/uL   Monocytes Relative 5  3 - 12 %   Monocytes Absolute 0.8  0.1 - 1.0 K/uL   Eosinophils Relative 1  0 - 5 %   Eosinophils Absolute 0.1  0.0 - 0.7 K/uL   Basophils Relative 0  0 - 1 %   Basophils Absolute 0.1  0.0 - 0.1 K/uL  COMPREHENSIVE METABOLIC PANEL      Result Value Range   Sodium 136  135 - 145 mEq/L   Potassium 4.0  3.5 - 5.1 mEq/L   Chloride 97  96 - 112 mEq/L   CO2 29  19 - 32 mEq/L   Glucose, Bld 270 (*) 70 - 99 mg/dL   BUN 10  6 - 23 mg/dL   Creatinine, Ser 6.57  0.50 - 1.10 mg/dL   Calcium 9.8  8.4 - 84.6 mg/dL   Total Protein 8.4 (*) 6.0 - 8.3 g/dL   Albumin 3.8  3.5 - 5.2 g/dL   AST 16  0 - 37 U/L   ALT 12  0 - 35 U/L   Alkaline Phosphatase 159 (*) 39 - 117 U/L   Total Bilirubin 0.6  0.3 - 1.2 mg/dL   GFR calc non Af Amer 82 (*) >90 mL/min   GFR calc Af Amer >90  >90 mL/min  URINE MICROSCOPIC-ADD ON      Result Value Range   Squamous Epithelial / LPF RARE  RARE   WBC, UA 0-2  <3 WBC/hpf   RBC / HPF 0-2  <3 RBC/hpf   Bacteria, UA RARE  RARE   Urine-Other MICROSCOPIC EXAM PERFORMED ON UNCONCENTRATED URINE          Ct Abdomen Pelvis W Contrast  07/19/2012   *RADIOLOGY REPORT*  Clinical Data: Right flank pain.  Nausea.Appendicitis.  CT ABDOMEN AND PELVIS WITH CONTRAST  Technique:  Multidetector CT imaging of  the abdomen and pelvis was performed following the standard protocol during bolus administration of intravenous contrast.  Contrast: OMNIPAQUE IOHEXOL 300 MG/ML  SOLN  Comparison: None.  Findings: Lung Bases: Clear.  Liver:  Normal.  Spleen:  Normal.  Gallbladder:  Cholecystectomy.  Common bile duct:  Normal.  Pancreas:  Normal.  Adrenal glands:  Normal.  Kidneys:  Normal renal enhancement.  Both ureters appear within normal limits.  Stomach:  Normal.  Small bowel:  Duodenum normal.  No small bowel obstruction.  No mesenteric adenopathy.  Colon:   Normal appendix.  Distal colon is decompressed.  No inflammatory changes or obstruction.  Pelvic Genitourinary:  Hysterectomy.  Mural thickening of the urinary bladder may be due to under distention.  Recommend correlation with urinalysis.  No free fluid.  No pelvic adenopathy.  Bones:  No aggressive osseous lesions.  Lumbosacral transitional anatomy.  Vasculature: Mild atherosclerosis.  Body Wall: Normal.  IMPRESSION:  1.  Mild thickening of the urinary bladder is probably due to under distention.  Correlation with urinalysis recommended to exclude cystitis.  2.  Cholecystectomy and hysterectomy. 3.  Normal appendix.   Original Report Authenticated By: Andreas Newport, M.D.     1. Right flank pain   2. Leukocytosis     MDM  Patient seen and evaluated. Patient appears uncomfortable does not appear in any acute distress.  Patient is afebrile. She does report a history of similar pain in the past however symptoms have worsened. She also has increased WBC. She had a recent CAT scan 6 months ago. I discuss options for further evaluation to rule out appendicitis. Patient is concerned about her appendix and wishes to proceed with CT scan.   CT scan unremarkable. No signs of acute or emergent condition explaining her symptoms. At this time we'll discharge with her PCP and specialty followup.  Angus Seller, PA-C 07/19/12 [phone]

## 2012-07-20 NOTE — ED Provider Notes (Signed)
Medical screening examination/treatment/procedure(s) were performed by non-physician practitioner and as supervising physician I was immediately available for consultation/collaboration.  Doug Sou, MD 07/20/12 [phone]

## 2012-08-23 ENCOUNTER — Encounter (HOSPITAL_COMMUNITY): Payer: Self-pay | Admitting: *Deleted

## 2012-08-23 ENCOUNTER — Emergency Department (HOSPITAL_COMMUNITY): Payer: Managed Care, Other (non HMO)

## 2012-08-23 ENCOUNTER — Emergency Department (HOSPITAL_COMMUNITY)
Admission: EM | Admit: 2012-08-23 | Discharge: 2012-08-23 | Disposition: A | Discharge status: Home / Self Care | Payer: Managed Care, Other (non HMO) | Attending: Emergency Medicine | Admitting: Emergency Medicine

## 2012-08-23 DIAGNOSIS — Z794 Long term (current) use of insulin: Secondary | ICD-10-CM | POA: Insufficient documentation

## 2012-08-23 DIAGNOSIS — R509 Fever, unspecified: Secondary | ICD-10-CM | POA: Insufficient documentation

## 2012-08-23 DIAGNOSIS — R059 Cough, unspecified: Secondary | ICD-10-CM | POA: Insufficient documentation

## 2012-08-23 DIAGNOSIS — Z888 Allergy status to other drugs, medicaments and biological substances status: Secondary | ICD-10-CM | POA: Insufficient documentation

## 2012-08-23 DIAGNOSIS — E860 Dehydration: Secondary | ICD-10-CM | POA: Insufficient documentation

## 2012-08-23 DIAGNOSIS — G8929 Other chronic pain: Secondary | ICD-10-CM | POA: Insufficient documentation

## 2012-08-23 DIAGNOSIS — Z79899 Other long term (current) drug therapy: Secondary | ICD-10-CM | POA: Insufficient documentation

## 2012-08-23 DIAGNOSIS — R197 Diarrhea, unspecified: Secondary | ICD-10-CM | POA: Insufficient documentation

## 2012-08-23 DIAGNOSIS — Z3202 Encounter for pregnancy test, result negative: Secondary | ICD-10-CM | POA: Insufficient documentation

## 2012-08-23 DIAGNOSIS — R109 Unspecified abdominal pain: Secondary | ICD-10-CM | POA: Insufficient documentation

## 2012-08-23 DIAGNOSIS — R42 Dizziness and giddiness: Secondary | ICD-10-CM | POA: Insufficient documentation

## 2012-08-23 DIAGNOSIS — H9209 Otalgia, unspecified ear: Secondary | ICD-10-CM | POA: Insufficient documentation

## 2012-08-23 DIAGNOSIS — Z87442 Personal history of urinary calculi: Secondary | ICD-10-CM | POA: Insufficient documentation

## 2012-08-23 DIAGNOSIS — E785 Hyperlipidemia, unspecified: Secondary | ICD-10-CM | POA: Insufficient documentation

## 2012-08-23 DIAGNOSIS — D72829 Elevated white blood cell count, unspecified: Secondary | ICD-10-CM | POA: Insufficient documentation

## 2012-08-23 DIAGNOSIS — M129 Arthropathy, unspecified: Secondary | ICD-10-CM | POA: Insufficient documentation

## 2012-08-23 DIAGNOSIS — R0602 Shortness of breath: Secondary | ICD-10-CM | POA: Insufficient documentation

## 2012-08-23 DIAGNOSIS — R51 Headache: Secondary | ICD-10-CM | POA: Insufficient documentation

## 2012-08-23 DIAGNOSIS — Z7982 Long term (current) use of aspirin: Secondary | ICD-10-CM | POA: Insufficient documentation

## 2012-08-23 DIAGNOSIS — Z881 Allergy status to other antibiotic agents status: Secondary | ICD-10-CM | POA: Insufficient documentation

## 2012-08-23 DIAGNOSIS — IMO0001 Reserved for inherently not codable concepts without codable children: Secondary | ICD-10-CM | POA: Insufficient documentation

## 2012-08-23 DIAGNOSIS — F4310 Post-traumatic stress disorder, unspecified: Secondary | ICD-10-CM | POA: Insufficient documentation

## 2012-08-23 DIAGNOSIS — F329 Major depressive disorder, single episode, unspecified: Secondary | ICD-10-CM | POA: Insufficient documentation

## 2012-08-23 DIAGNOSIS — M791 Myalgia, unspecified site: Secondary | ICD-10-CM

## 2012-08-23 DIAGNOSIS — R05 Cough: Secondary | ICD-10-CM | POA: Insufficient documentation

## 2012-08-23 DIAGNOSIS — Z8719 Personal history of other diseases of the digestive system: Secondary | ICD-10-CM | POA: Insufficient documentation

## 2012-08-23 DIAGNOSIS — Z8669 Personal history of other diseases of the nervous system and sense organs: Secondary | ICD-10-CM | POA: Insufficient documentation

## 2012-08-23 DIAGNOSIS — G479 Sleep disorder, unspecified: Secondary | ICD-10-CM | POA: Insufficient documentation

## 2012-08-23 DIAGNOSIS — R112 Nausea with vomiting, unspecified: Secondary | ICD-10-CM | POA: Insufficient documentation

## 2012-08-23 LAB — COMPREHENSIVE METABOLIC PANEL
ALT: 12 U/L (ref 0–35)
AST: 19 U/L (ref 0–37)
Albumin: 3.9 g/dL (ref 3.5–5.2)
Alkaline Phosphatase: 149 U/L — ABNORMAL HIGH (ref 39–117)
BUN: 8 mg/dL (ref 6–23)
CO2: 21 mEq/L (ref 19–32)
Calcium: 10.1 mg/dL (ref 8.4–10.5)
Chloride: 98 mEq/L (ref 96–112)
Creatinine, Ser: 0.87 mg/dL (ref 0.50–1.10)
GFR calc Af Amer: >90 mL/min (ref >90)
GFR calc non Af Amer: 83 mL/min — ABNORMAL LOW (ref >90)
Glucose, Bld: 220 mg/dL — ABNORMAL HIGH (ref 70–99)
Potassium: 4.5 mEq/L (ref 3.5–5.1)
Sodium: 135 mEq/L (ref 135–145)
Total Bilirubin: 0.7 mg/dL (ref 0.3–1.2)
Total Protein: 8.9 g/dL — ABNORMAL HIGH (ref 6.0–8.3)

## 2012-08-23 LAB — CBC WITH DIFFERENTIAL/PLATELET
Basophils Absolute: 0.1 10*3/uL (ref 0.0–0.1)
Basophils Relative: 0 % (ref 0–1)
Eosinophils Absolute: 0 10*3/uL (ref 0.0–0.7)
Eosinophils Relative: 0 % (ref 0–5)
HCT: 47.2 % — ABNORMAL HIGH (ref 36.0–46.0)
Hemoglobin: 16.5 g/dL — ABNORMAL HIGH (ref 12.0–15.0)
Lymphocytes Relative: 16 % (ref 12–46)
Lymphs Abs: 2.1 10*3/uL (ref 0.7–4.0)
MCH: 32.4 pg (ref 26.0–34.0)
MCHC: 35 g/dL (ref 30.0–36.0)
MCV: 92.5 fL (ref 78.0–100.0)
Monocytes Absolute: 0.7 10*3/uL (ref 0.1–1.0)
Monocytes Relative: 6 % (ref 3–12)
Neutro Abs: 10.4 10*3/uL — ABNORMAL HIGH (ref 1.7–7.7)
Neutrophils Relative %: 78 % — ABNORMAL HIGH (ref 43–77)
Platelets: 444 10*3/uL — ABNORMAL HIGH (ref 150–400)
RBC: 5.1 MIL/uL (ref 3.87–5.11)
RDW: 13.3 % (ref 11.5–15.5)
WBC: 13.3 10*3/uL — ABNORMAL HIGH (ref 4.0–10.5)

## 2012-08-23 LAB — POCT I-STAT TROPONIN I: Troponin i, poc: 0 ng/mL (ref 0.00–0.08)

## 2012-08-23 LAB — URINALYSIS, ROUTINE W REFLEX MICROSCOPIC
Glucose, UA: 500 mg/dL — AB
Hgb urine dipstick: NEGATIVE
Ketones, ur: 15 mg/dL — AB
Nitrite: NEGATIVE
Protein, ur: 100 mg/dL — AB
Specific Gravity, Urine: 1.036 — ABNORMAL HIGH (ref 1.005–1.030)
Urobilinogen, UA: 0.2 mg/dL (ref 0.0–1.0)
pH: 5.5 (ref 5.0–8.0)

## 2012-08-23 LAB — URINE MICROSCOPIC-ADD ON

## 2012-08-23 LAB — POCT PREGNANCY, URINE: Preg Test, Ur: NEGATIVE

## 2012-08-23 LAB — GLUCOSE, CAPILLARY: Glucose-Capillary: 202 mg/dL — ABNORMAL HIGH (ref 70–99)

## 2012-08-23 LAB — LIPASE, BLOOD: Lipase: 18 U/L (ref 11–59)

## 2012-08-23 MED ORDER — HYDROCODONE-ACETAMINOPHEN 5-325 MG PO TABS: ORAL_TABLET | ORAL | Status: DC

## 2012-08-23 MED ORDER — PROMETHAZINE HCL 25 MG PO TABS: 25.0000 mg | ORAL_TABLET | Freq: Four times a day (QID) | ORAL | Status: DC | PRN

## 2012-08-23 MED ORDER — MORPHINE SULFATE 4 MG/ML IJ SOLN
4.0000 mg | Freq: Once | INTRAMUSCULAR | Status: AC
  Administered 2012-08-23: 4 mg via INTRAVENOUS
  Filled 2012-08-23: qty 1

## 2012-08-23 MED ORDER — ACETAMINOPHEN 325 MG PO TABS
650.0000 mg | ORAL_TABLET | Freq: Once | ORAL | Status: AC
  Administered 2012-08-23: 650 mg via ORAL
  Filled 2012-08-23: qty 2

## 2012-08-23 MED ORDER — ONDANSETRON HCL 4 MG/2ML IJ SOLN
4.0000 mg | Freq: Once | INTRAMUSCULAR | Status: AC
  Administered 2012-08-23: 4 mg via INTRAVENOUS
  Filled 2012-08-23: qty 2

## 2012-08-23 MED ORDER — SODIUM CHLORIDE 0.9 % IV BOLUS (SEPSIS)
1000.0000 mL | Freq: Once | INTRAVENOUS | Status: AC
  Administered 2012-08-23: 1000 mL via INTRAVENOUS

## 2012-08-23 NOTE — ED Notes (Signed)
Iv nurse at the bedside

## 2012-08-23 NOTE — ED Notes (Signed)
Diet gingerale.

## 2012-08-23 NOTE — ED Notes (Signed)
Pt ambulated to restroom with no issues.  

## 2012-08-23 NOTE — ED Provider Notes (Signed)
CSN: [id number]     Arrival date & time 08/23/12  1316 History     First MD Initiated Contact with Patient 08/23/12 1653     No chief complaint on file.  (Consider location/radiation/quality/duration/timing/severity/associated sxs/prior Treatment) HPI  Maureen [name] is a 42 y.o. female complaining of fever (Tmax 100.5) with several episodes of nonbloody, nonbilious, no coffee ground emesis associated with loose stools and yesterday. She is tolerating by mouth today she endorses a dry cough and lightheaded sensation. Associated with myalgia, bilateral frontal headache, bilateral earaches and shortness of breath. Patient has chronic right-sided abdominal pain she states is different from normal with radiation to the back and scapular area. Patient denies rash, recent travel, camping, bug or tick bite.    Past Medical History  Diagnosis Date  . Diabetes mellitus   . Fibromyalgia   . Arthritis   . Hyperlipidemia   . Chills   . Abdominal pain   . Nausea & vomiting   . Biliary dyskinesia   . Depression   . Migraine   . Joint pain   . Skin rash     due to medications  . Post traumatic stress disorder (PTSD)   . Sleep trouble   . Major depressive disorder   . Cervical strain   . Closed head injury   . Renal stones    Past Surgical History  Procedure Laterality Date  . Tonsillectomy  2001  . Lipoma removed    . Knee surgery    . Cesarean section  1997, 1999  . Abdominal hysterectomy  2001  . Tumor removal  2000    lower back  . Eye surgery  2006    laser eye surgery left eye  . Cholecystectomy  02/10/2011    Procedure: LAPAROSCOPIC CHOLECYSTECTOMY WITH INTRAOPERATIVE CHOLANGIOGRAM;  Surgeon: Rulon Abide, DO;  Location: [facility] OR;  Service: General;  Laterality: N/A;  . Robotic assisted laparoscopic lysis of adhesion N/A 03/04/2012    Procedure: ROBOTIC ASSISTED LAPAROSCOPIC LYSIS OF EXTENSIVE ADHESIONS;  Surgeon: Tresa Endo A. Ernestina Penna, MD;  Location: [facility] ORS;  Service:  Gynecology;  Laterality: N/A;   Family History  Problem Relation Age of Onset  . Cancer Father     lymphoma  . Nephrolithiasis Father   . Vascular Disease Father   . Cancer Maternal Grandmother     colon  . Hypertension Mother   . Heart disease Mother   . Cataracts Mother   . Diabetes Brother   . Mental illness Maternal Grandfather   . Cancer Maternal Grandfather   . Heart disease Paternal Grandmother   . Heart disease Paternal Grandfather    History  Substance Use Topics  . Smoking status: Never Smoker   . Smokeless tobacco: Never Used  . Alcohol Use: No   OB History   Grav Para Term Preterm Abortions TAB SAB Ect Mult Living                 Review of Systems 10 systems reviewed and found to be negative, except as noted in the HPI  Allergies  Meloxicam; Cephalexin; and Ibuprofen  Home Medications   Current Outpatient Rx  Name  Route  Sig  Dispense  Refill  . acetaminophen (TYLENOL) 500 MG tablet   Oral   Take 1,000 mg by mouth every 6 (six) hours as needed for pain.         Marland Kitchen aspirin EC 81 MG tablet   Oral   Take 81 mg by mouth  daily.         . atorvastatin (LIPITOR) 40 MG tablet   Oral   Take 40 mg by mouth every morning.         . insulin aspart (NOVOLOG) 100 UNIT/ML injection   Subcutaneous   Inject 8 Units into the skin 3 (three) times daily before meals.         . insulin glargine (LANTUS) 100 UNIT/ML injection   Subcutaneous   Inject 28 Units into the skin at bedtime.           BP 135/76  Pulse 96  Temp(Src) 98.5 F (36.9 C) (Oral)  Resp 16  SpO2 99% Physical Exam  Nursing note and vitals reviewed. Constitutional: She is oriented to person, place, and time. She appears well-developed and well-nourished. No distress.  HENT:  Head: Normocephalic.  Mildly dry MM,   Bilateral tympanic membranes show normal architecture with no bulging and good light reflex  Eyes: Conjunctivae and EOM are normal. Pupils are equal, round, and  reactive to light.  Neck: Normal range of motion. Neck supple.  Cardiovascular: Normal rate, regular rhythm and intact distal pulses.   Pulmonary/Chest: Effort normal and breath sounds normal. No stridor. No respiratory distress. She has no wheezes. She has no rales. She exhibits no tenderness.  Abdominal: Soft. She exhibits no distension. There is no tenderness. There is no rebound and no guarding.  Musculoskeletal: Normal range of motion. She exhibits no edema.  No calf asymmetry, superficial collaterals, palpable cords, edema, Homans sign negative bilaterally.    Neurological: She is alert and oriented to person, place, and time.  Psychiatric: She has a normal mood and affect.    ED Course   Procedures (including critical care time)  Labs Reviewed  CBC WITH DIFFERENTIAL - Abnormal; Notable for the following:    WBC 13.3 (*)    Hemoglobin 16.5 (*)    HCT 47.2 (*)    Platelets 444 (*)    Neutrophils Relative % 78 (*)    Neutro Abs 10.4 (*)    All other components within normal limits  COMPREHENSIVE METABOLIC PANEL - Abnormal; Notable for the following:    Glucose, Bld 220 (*)    Total Protein 8.9 (*)    Alkaline Phosphatase 149 (*)    GFR calc non Af Amer 83 (*)    All other components within normal limits  URINALYSIS, ROUTINE W REFLEX MICROSCOPIC - Abnormal; Notable for the following:    Color, Urine AMBER (*)    APPearance TURBID (*)    Specific Gravity, Urine 1.036 (*)    Glucose, UA 500 (*)    Bilirubin Urine SMALL (*)    Ketones, ur 15 (*)    Protein, ur 100 (*)    Leukocytes, UA TRACE (*)    All other components within normal limits  GLUCOSE, CAPILLARY - Abnormal; Notable for the following:    Glucose-Capillary 202 (*)    All other components within normal limits  URINE MICROSCOPIC-ADD ON - Abnormal; Notable for the following:    Squamous Epithelial / LPF MANY (*)    Bacteria, UA MANY (*)    All other components within normal limits  URINE CULTURE  LIPASE,  BLOOD  POCT I-STAT TROPONIN I  POCT PREGNANCY, URINE   Dg Chest 2 View  08/23/2012   *RADIOLOGY REPORT*  Clinical Data: Coughing and fever with shortness of breath.  CHEST - 2 VIEW  Comparison: 05/18/2012.  Findings: The cardiac silhouette is normal size  and shape. Mediastinal and hilar contours appear stable and normal.  No pulmonary infiltrates or masses are seen. No pleural abnormality is evident. Bones appear average for age.  IMPRESSION: No acute or active cardiopulmonary or pleural abnormalities are identified.   Original Report Authenticated By: Onalee Hua Call     Date: 08/23/2012  Rate:[id number]  Rhythm: normal sinus rhythm  QRS Axis: normal  Intervals: normal  ST/T Wave abnormalities: normal  Conduction Disutrbances:none  Narrative Interpretation:   Old EKG Reviewed: None available   1. Nausea vomiting and diarrhea   2. Myalgia   3. Chronic abdominal pain   4. Dehydration, mild     MDM   Filed Vitals:   08/23/12 1331 08/23/12 1529  BP: 139/73 135/76  Pulse: 98 96  Temp: 98.5 F (36.9 C) 98.5 F (36.9 C)  TempSrc: Oral Oral  Resp: 16 16  SpO2: 100% 99%     Maureen [name] is a 42 y.o. female  with flulike symptoms, doubt [facility] spotted fever as there has been no tick exposure or rash. Patient does not meet SIRS criteria.  I think this is a viral syndrome overlaid on her chronic abdominal pain and fibromyalgia, with slight dehydration. Mild leukocytosis but blood work is otherwise unremarkable, she is a mildly elevated CBG with no anion gap, H&H appears hemoconcentrated. Chest x-ray shows no infiltrates. Plan is pain medications IV fluid and by mouth challenge.    Medications  sodium chloride 0.9 % bolus 1,000 mL (not administered)  ondansetron (ZOFRAN) injection 4 mg (not administered)  morphine 4 MG/ML injection 4 mg (not administered)  acetaminophen (TYLENOL) tablet 650 mg (not administered)    Patient is tolerating by mouth, Pt is hemodynamically stable,  appropriate for, and amenable to discharge at this time. Pt verbalized understanding and agrees with care plan. All questions answered. Outpatient follow-up and specific return precautions discussed.    New Prescriptions   HYDROCODONE-ACETAMINOPHEN (NORCO/VICODIN) 5-325 MG PER TABLET    Take 1-2 tablets by mouth every 6 hours as needed for pain.   PROMETHAZINE (PHENERGAN) 25 MG TABLET    Take 1 tablet (25 mg total) by mouth every 6 (six) hours as needed for nausea.    Note: Portions of this report may have been transcribed using voice recognition software. Every effort was made to ensure accuracy; however, inadvertent computerized transcription errors may be present    Wynetta Emery, PA-C 08/23/12 2109

## 2012-08-23 NOTE — ED Notes (Signed)
Pt. Placed in Pod A 1

## 2012-08-23 NOTE — ED Notes (Signed)
Pt states for the last couple of days has been having chills and fever.  Pt states vomiting and diarrhea yesterday.  Pt just feels weak like she is going to pass out.  Pt has chronic right abdominal pain.  Pt states the pain now radiates up to right shoulder and middle of back.  Pt reports cough.  Pt reports chest pain

## 2012-08-23 NOTE — ED Notes (Signed)
Spoke with IV nurse she will be down to insert IV

## 2012-08-23 NOTE — ED Notes (Signed)
Pt. Is aware of needing urine specimen.  Unable to go at this time.

## 2012-08-23 NOTE — ED Notes (Signed)
Pt c/o chills and fever that started on Sunday, along with muscle aches yesterday. sts this morning the aches became much worse and felt she could barely get up this morning. Pt sts she does have some right lower abd pain, but it is chronic and doesn't think its different that normal. sts she has felt a little sob since Sunday. Pt in nad , skin warm and dry, resp e/u. Ambulated to room with no issues.

## 2012-08-25 LAB — URINE CULTURE: Colony Count: >=100000

## 2012-08-26 ENCOUNTER — Ambulatory Visit: Payer: Managed Care, Other (non HMO)

## 2012-08-26 ENCOUNTER — Ambulatory Visit (INDEPENDENT_AMBULATORY_CARE_PROVIDER_SITE_OTHER): Payer: Managed Care, Other (non HMO) | Admitting: Family Medicine

## 2012-08-26 VITALS — BP 120/64 | HR 85 | Temp 98.0°F | Resp 18 | Ht 64.5 in | Wt 179.0 lb

## 2012-08-26 DIAGNOSIS — M25519 Pain in unspecified shoulder: Secondary | ICD-10-CM

## 2012-08-26 DIAGNOSIS — M25511 Pain in right shoulder: Secondary | ICD-10-CM

## 2012-08-26 DIAGNOSIS — T148XXA Other injury of unspecified body region, initial encounter: Secondary | ICD-10-CM

## 2012-08-26 MED ORDER — HYDROCODONE-ACETAMINOPHEN 5-325 MG PO TABS: ORAL_TABLET | ORAL | Status: DC

## 2012-08-26 MED ORDER — METHOCARBAMOL 500 MG PO TABS: 500.0000 mg | ORAL_TABLET | Freq: Every evening | ORAL | Status: DC | PRN

## 2012-08-26 NOTE — Progress Notes (Signed)
[facility]:  Office Visit  Chief Complaint:  Chief Complaint  Patient presents with  . Follow-up    hospital     HPI: Maureen [name] is a 42 y.o. female who complains of  Here for hosptial f/u  From 8/5 for fever, cough, vomiting, nausea and diarrhea and muscle aches. She was dx with viral illness, CBC showed mild leukocytosis and chest xray unremarkable. She feels better today. Her CBG was elevated without anion gap.The sxs are better, fever is gone, no more vomiting, nonbloody diarrhea, she still has chills intermittently. She felt better after her IVF.  She continues to have right shoulder blade pain and has a lump under her arm which is where the pain radiates from. The bump on her right armpit has been a chronic problem, she mentioned it about a year ago and was told to change her underarm deodorant. She has switched antidperessant, this was 1 year ago and the bump under her right armpit is still present and may be  bigger. She has been rubbing on it There is pain on touching it and intermittent pain with certain movements, she has sharp pain 10/10 at worse pain and can last several days. This episode started 1 week ago. She has occ numbness and tingling. She has a history of Type 1 DM, fibro, arthritis, MDD. She cannot take NSAIDs. Denies repetitive motion or heavy lifting.    Past Medical History  Diagnosis Date  . Diabetes mellitus   . Fibromyalgia   . Arthritis   . Hyperlipidemia   . Chills   . Abdominal pain   . Nausea & vomiting   . Biliary dyskinesia   . Depression   . Migraine   . Joint pain   . Skin rash     due to medications  . Post traumatic stress disorder (PTSD)   . Sleep trouble   . Major depressive disorder   . Cervical strain   . Closed head injury   . Renal stones    Past Surgical History  Procedure Laterality Date  . Tonsillectomy  2001  . Lipoma removed    . Knee surgery    . Cesarean section  1997, 1999  . Abdominal  hysterectomy  2001  . Tumor removal  2000    lower back  . Eye surgery  2006    laser eye surgery left eye  . Cholecystectomy  02/10/2011    Procedure: LAPAROSCOPIC CHOLECYSTECTOMY WITH INTRAOPERATIVE CHOLANGIOGRAM;  Surgeon: Rulon Abide, DO;  Location: [facility];  Service: General;  Laterality: N/A;  . Robotic assisted laparoscopic lysis of adhesion N/A 03/04/2012    Procedure: ROBOTIC ASSISTED LAPAROSCOPIC LYSIS OF EXTENSIVE ADHESIONS;  Surgeon: Tresa Endo A. Ernestina Penna, MD;  Location: [facility]S;  Service: Gynecology;  Laterality: N/A;   History   Social History  . Marital Status: Married    Spouse Name: Jimesha Rising    Number of Children: 2  . Years of Education: 12+   Occupational History  . Student-[employer]     [employer]   Social History Main Topics  . Smoking status: Never Smoker   . Smokeless tobacco: Never Used  . Alcohol Use: No  . Drug Use: No  . Sexually Active: Yes -- Female partner(s)    Birth Control/ Protection: Surgical   Other Topics Concern  . None   Social History Narrative   Lives with husband and two sons.  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  Stop working when she was diagnosed with endometriosis and had a TIA.   Family History  Problem Relation Age of Onset  . Cancer Father     lymphoma  . Nephrolithiasis Father   . Vascular Disease Father   . Cancer Maternal Grandmother     colon  . Hypertension Mother   . Heart disease Mother   . Cataracts Mother   . Diabetes Brother   . Mental illness Maternal Grandfather   . Cancer Maternal Grandfather   . Heart disease Paternal Grandmother   . Heart disease Paternal Grandfather    Allergies  Allergen Reactions  . Meloxicam Nausea Only  . Cephalexin Rash  . Ibuprofen Nausea And Vomiting and Rash   Prior to Admission medications   Medication Sig Start Date End Date Taking? Authorizing Provider  acetaminophen (TYLENOL) 500 MG tablet Take 1,000 mg by mouth  every 6 (six) hours as needed for pain.   Yes Historical Provider, MD  aspirin EC 81 MG tablet Take 81 mg by mouth daily.   Yes Historical Provider, MD  atorvastatin (LIPITOR) 40 MG tablet Take 40 mg by mouth every morning.   Yes Historical Provider, MD  insulin aspart (NOVOLOG) 100 UNIT/ML injection Inject 8 Units into the skin 3 (three) times daily before meals.   Yes Historical Provider, MD  insulin glargine (LANTUS) 100 UNIT/ML injection Inject 28 Units into the skin at bedtime.    Yes Historical Provider, MD  promethazine (PHENERGAN) 25 MG tablet Take 1 tablet (25 mg total) by mouth every 6 (six) hours as needed for nausea. 08/23/12  Yes Nicole Pisciotta, PA-C  HYDROcodone-acetaminophen (NORCO/VICODIN) 5-325 MG per tablet Take 1-2 tablets by mouth every 6 hours as needed for pain. 08/23/12   Nicole Pisciotta, PA-C     ROS: The patient denies fevers, chills, night sweats, unintentional weight loss, chest pain, palpitations, wheezing, dyspnea on exertion, nausea, vomiting, abdominal pain, dysuria, hematuria, melena, + numbness, or tingling.  All other systems have been reviewed and were otherwise negative with the exception of those mentioned in the HPI and as above.    PHYSICAL EXAM: Filed Vitals:   08/26/12 1005  BP: 120/64  Pulse: 85  Temp: 98 F (36.7 C)  Resp: 18   Filed Vitals:   08/26/12 1005  Height: 5' 4.5" (1.638 m)  Weight: 179 lb (81.194 kg)   Body mass index is 30.26 kg/(m^2).  General: Alert, no acute distress HEENT:  Normocephalic, atraumatic, oropharynx patent.  Cardiovascular:  Regular rate and rhythm, no rubs murmurs or gallops.  No Carotid bruits, radial pulse intact. No pedal edema.  Respiratory: Clear to auscultation bilaterally.  No wheezes, rales, or rhonchi.  No cyanosis, no use of accessory musculature GI: No organomegaly, abdomen is soft and non-tender, positive bowel sounds.  No masses. Skin: No rashes. Neurologic: Facial musculature  symmetric. Psychiatric: Patient is appropriate throughout our interaction. Lymphatic: No cervical lymphadenopathy Musculoskeletal: Gait intact. Breast exam normal Neck-full ROm, neg Spurling Right axillary-hard knot, fibrous tissue, about 1/2 inch in length and  5 mm in diameter, scar tissue or keloid like in appearance, superficial surface not LAD Right shoulder-minimal decrease ROM and pain with IR/ER/flexion, 5/5 strength, tender along scapula and also trapezius but no appreciable scapular dyskinesia, neg Speeds, neg liftoff   LABS:    EKG/XRAY:   Primary read interpreted by Dr. Conley Rolls at [facility]. No fractures or dilocation   ASSESSMENT/PLAN: Encounter Diagnoses  Name Primary?  . Right  shoulder pain Yes  . Sprain and strain    Exercises given for possible rotator cuff impingement, strengthening exercise Refilled Norco Rx Robaxin Can't take NSAIDs F/u prn if no improvement She does have an axillary lesion on right side most likely keloid or scar tissue from old wound, continue to monitor, she will go to breast center for screening mammo since she will be 42 in October Gross sideeffects, risk and benefits, and alternatives of medications d/w patient. Patient is aware that all medications have potential sideeffects and we are unable to predict every sideeffect or drug-drug interaction that may occur.     Anaissa Macfadden PHUONG, DO 08/26/2012 1:05 PM

## 2012-08-26 NOTE — Patient Instructions (Signed)

## 2012-08-27 NOTE — ED Provider Notes (Signed)
Medical screening examination/treatment/procedure(s) were performed by non-physician practitioner and as supervising physician I was immediately available for consultation/collaboration.   Shelda Jakes, MD 08/27/12 [phone]

## 2012-09-13 ENCOUNTER — Encounter (HOSPITAL_BASED_OUTPATIENT_CLINIC_OR_DEPARTMENT_OTHER): Payer: Self-pay | Admitting: *Deleted

## 2012-09-13 ENCOUNTER — Emergency Department (HOSPITAL_BASED_OUTPATIENT_CLINIC_OR_DEPARTMENT_OTHER)
Admission: EM | Admit: 2012-09-13 | Discharge: 2012-09-13 | Disposition: A | Discharge status: Home / Self Care | Payer: Managed Care, Other (non HMO) | Attending: Emergency Medicine | Admitting: Emergency Medicine

## 2012-09-13 ENCOUNTER — Ambulatory Visit (INDEPENDENT_AMBULATORY_CARE_PROVIDER_SITE_OTHER): Payer: Managed Care, Other (non HMO) | Admitting: Family Medicine

## 2012-09-13 ENCOUNTER — Emergency Department (HOSPITAL_BASED_OUTPATIENT_CLINIC_OR_DEPARTMENT_OTHER): Payer: Managed Care, Other (non HMO)

## 2012-09-13 ENCOUNTER — Ambulatory Visit: Payer: Managed Care, Other (non HMO)

## 2012-09-13 VITALS — BP 124/82 | HR 100 | Temp 98.5°F | Resp 20 | Ht 64.0 in | Wt 176.4 lb

## 2012-09-13 DIAGNOSIS — N898 Other specified noninflammatory disorders of vagina: Secondary | ICD-10-CM

## 2012-09-13 DIAGNOSIS — Z7982 Long term (current) use of aspirin: Secondary | ICD-10-CM | POA: Insufficient documentation

## 2012-09-13 DIAGNOSIS — M545 Low back pain, unspecified: Secondary | ICD-10-CM | POA: Insufficient documentation

## 2012-09-13 DIAGNOSIS — Z8719 Personal history of other diseases of the digestive system: Secondary | ICD-10-CM | POA: Insufficient documentation

## 2012-09-13 DIAGNOSIS — M549 Dorsalgia, unspecified: Secondary | ICD-10-CM

## 2012-09-13 DIAGNOSIS — E119 Type 2 diabetes mellitus without complications: Secondary | ICD-10-CM | POA: Insufficient documentation

## 2012-09-13 DIAGNOSIS — Z8739 Personal history of other diseases of the musculoskeletal system and connective tissue: Secondary | ICD-10-CM | POA: Insufficient documentation

## 2012-09-13 DIAGNOSIS — Z8679 Personal history of other diseases of the circulatory system: Secondary | ICD-10-CM | POA: Insufficient documentation

## 2012-09-13 DIAGNOSIS — R509 Fever, unspecified: Secondary | ICD-10-CM | POA: Insufficient documentation

## 2012-09-13 DIAGNOSIS — E785 Hyperlipidemia, unspecified: Secondary | ICD-10-CM | POA: Insufficient documentation

## 2012-09-13 DIAGNOSIS — Z79899 Other long term (current) drug therapy: Secondary | ICD-10-CM | POA: Insufficient documentation

## 2012-09-13 DIAGNOSIS — E109 Type 1 diabetes mellitus without complications: Secondary | ICD-10-CM

## 2012-09-13 DIAGNOSIS — Z794 Long term (current) use of insulin: Secondary | ICD-10-CM | POA: Insufficient documentation

## 2012-09-13 DIAGNOSIS — M129 Arthropathy, unspecified: Secondary | ICD-10-CM | POA: Insufficient documentation

## 2012-09-13 DIAGNOSIS — L299 Pruritus, unspecified: Secondary | ICD-10-CM

## 2012-09-13 DIAGNOSIS — Z87828 Personal history of other (healed) physical injury and trauma: Secondary | ICD-10-CM | POA: Insufficient documentation

## 2012-09-13 DIAGNOSIS — Z9889 Other specified postprocedural states: Secondary | ICD-10-CM | POA: Insufficient documentation

## 2012-09-13 DIAGNOSIS — Z87442 Personal history of urinary calculi: Secondary | ICD-10-CM | POA: Insufficient documentation

## 2012-09-13 DIAGNOSIS — Z8659 Personal history of other mental and behavioral disorders: Secondary | ICD-10-CM | POA: Insufficient documentation

## 2012-09-13 LAB — POCT UA - MICROSCOPIC ONLY
Casts, Ur, LPF, POC: NEGATIVE
Crystals, Ur, HPF, POC: NEGATIVE
Mucus, UA: NEGATIVE
Yeast, UA: NEGATIVE

## 2012-09-13 LAB — POCT URINALYSIS DIPSTICK
Glucose, UA: >1000
Ketones, UA: 40
Leukocytes, UA: NEGATIVE
Nitrite, UA: NEGATIVE
Protein, UA: >300
Spec Grav, UA: >=1.03
Urobilinogen, UA: 0.2
pH, UA: 5

## 2012-09-13 LAB — CBC WITH DIFFERENTIAL/PLATELET
Basophils Absolute: 0.1 10*3/uL (ref 0.0–0.1)
Basophils Relative: 0 % (ref 0–1)
Eosinophils Absolute: 0 10*3/uL (ref 0.0–0.7)
Eosinophils Relative: 0 % (ref 0–5)
HCT: 45.9 % (ref 36.0–46.0)
Hemoglobin: 15.9 g/dL — ABNORMAL HIGH (ref 12.0–15.0)
Lymphocytes Relative: 11 % — ABNORMAL LOW (ref 12–46)
Lymphs Abs: 1.8 10*3/uL (ref 0.7–4.0)
MCH: 31.7 pg (ref 26.0–34.0)
MCHC: 34.6 g/dL (ref 30.0–36.0)
MCV: 91.4 fL (ref 78.0–100.0)
Monocytes Absolute: 0.7 10*3/uL (ref 0.1–1.0)
Monocytes Relative: 4 % (ref 3–12)
Neutro Abs: 13.7 10*3/uL — ABNORMAL HIGH (ref 1.7–7.7)
Neutrophils Relative %: 84 % — ABNORMAL HIGH (ref 43–77)
Platelets: 432 10*3/uL — ABNORMAL HIGH (ref 150–400)
RBC: 5.02 MIL/uL (ref 3.87–5.11)
RDW: 12.9 % (ref 11.5–15.5)
WBC: 16.3 10*3/uL — ABNORMAL HIGH (ref 4.0–10.5)

## 2012-09-13 LAB — POCT WET PREP WITH KOH
Clue Cells Wet Prep HPF POC: 100
KOH Prep POC: NEGATIVE
RBC Wet Prep HPF POC: NEGATIVE
Trichomonas, UA: NEGATIVE
WBC Wet Prep HPF POC: NEGATIVE
Yeast Wet Prep HPF POC: NEGATIVE

## 2012-09-13 LAB — COMPREHENSIVE METABOLIC PANEL
ALT: 27 U/L (ref 0–35)
AST: 16 U/L (ref 0–37)
Albumin: 4 g/dL (ref 3.5–5.2)
Alkaline Phosphatase: 154 U/L — ABNORMAL HIGH (ref 39–117)
BUN: 9 mg/dL (ref 6–23)
CO2: 28 mEq/L (ref 19–32)
Calcium: 11 mg/dL — ABNORMAL HIGH (ref 8.4–10.5)
Chloride: 95 mEq/L — ABNORMAL LOW (ref 96–112)
Creatinine, Ser: 0.9 mg/dL (ref 0.50–1.10)
GFR calc Af Amer: >90 mL/min (ref >90)
GFR calc non Af Amer: 80 mL/min — ABNORMAL LOW (ref >90)
Glucose, Bld: 299 mg/dL — ABNORMAL HIGH (ref 70–99)
Potassium: 4.1 mEq/L (ref 3.5–5.1)
Sodium: 136 mEq/L (ref 135–145)
Total Bilirubin: 0.7 mg/dL (ref 0.3–1.2)
Total Protein: 8.8 g/dL — ABNORMAL HIGH (ref 6.0–8.3)

## 2012-09-13 LAB — POCT CBC
Granulocyte percent: 85.2 %G — AB (ref 37–80)
HCT, POC: 46 % (ref 37.7–47.9)
Hemoglobin: 14.8 g/dL (ref 12.2–16.2)
Lymph, poc: 1.6 (ref 0.6–3.4)
MCH, POC: 31.8 pg — AB (ref 27–31.2)
MCHC: 32.2 g/dL (ref 31.8–35.4)
MCV: 98.7 fL — AB (ref 80–97)
MID (cbc): 0.6 (ref 0–0.9)
MPV: 9 fL (ref 0–99.8)
POC Granulocyte: 13 — AB (ref 2–6.9)
POC LYMPH PERCENT: 10.8 %L (ref 10–50)
POC MID %: 4 %M (ref 0–12)
Platelet Count, POC: 450 10*3/uL — AB (ref 142–424)
RBC: 4.66 M/uL (ref 4.04–5.48)
RDW, POC: 14.6 %
WBC: 15.2 10*3/uL — AB (ref 4.6–10.2)

## 2012-09-13 LAB — GLUCOSE, POCT (MANUAL RESULT ENTRY): POC Glucose: 260 mg/dl — AB (ref 70–99)

## 2012-09-13 MED ORDER — SODIUM CHLORIDE 0.9 % IV SOLN
1000.0000 mL | Freq: Once | INTRAVENOUS | Status: AC
  Administered 2012-09-13: 1000 mL via INTRAVENOUS

## 2012-09-13 MED ORDER — HYDROMORPHONE HCL PF 1 MG/ML IJ SOLN
1.0000 mg | Freq: Once | INTRAMUSCULAR | Status: AC
  Administered 2012-09-13: 1 mg via INTRAVENOUS
  Filled 2012-09-13: qty 1

## 2012-09-13 MED ORDER — HYDROCODONE-ACETAMINOPHEN 5-325 MG PO TABS: 2.0000 | ORAL_TABLET | ORAL | Status: DC | PRN

## 2012-09-13 MED ORDER — SODIUM CHLORIDE 0.9 % IV SOLN: 1000.0000 mL | Freq: Once | INTRAVENOUS | Status: DC

## 2012-09-13 MED ORDER — ONDANSETRON HCL 4 MG/2ML IJ SOLN
4.0000 mg | Freq: Once | INTRAMUSCULAR | Status: AC
  Administered 2012-09-13: 4 mg via INTRAVENOUS
  Filled 2012-09-13: qty 2

## 2012-09-13 NOTE — ED Notes (Signed)
Patient transported to CT 

## 2012-09-13 NOTE — ED Notes (Signed)
Back pain since yesterday, SOB, headache

## 2012-09-13 NOTE — Progress Notes (Addendum)
[facility] [address], [place] [address] [phone]  Date:  09/13/2012   Name:  JANNETH KRASNER   DOB:  September 14, 1970   MRN:  [id number]  PCP:  Elvina Sidle, MD    Chief Complaint: Follow-up, Headache and General itching   History of Present Illness:  GRACELEE STEMMLER is a 42 y.o. very pleasant female patient who presents with the following:  Extensive medical history including DMI, arthritis,FBM, depression, kidney stones.  Last here on 08/26/12 for hospital follow up (at the ER 8/5) with fever, cough, nausea, diarrhea and aches.  She hd been dx with a viral illness.  She felt better at her follow- up appt.  Was noted to have a chronic lump in hr right axilla and pain in her right shoulder. She has had this lump under her arm for over a year and is concerned that it could indicate something serious.     She states that she felt better, but last week she "started to get worse" again.  States she is here today because this bump is bothering her again.  However, she has other complaints as well today; "it's everything." She states she did not sleep last night because "everything hurts."  She thinks this might be why she feels SOB.  She has noted SOB for the last few days.   She has noted a headache since yesterday.  Tylenol did help.  She has a history of migraine HA.  However, current HA is not as severe as a migraine.    She also notes she has generalized itching over her body for several days. She has scratched herself until she left marks on her skin.   She also notes that her back pain continues to hurt- this time it is more her upper back than her lower back that is painful.  She has had back pain again for less than a week.    She states she is not currently having any urinary sx.  She has had a hysterectomy.  Last week she noted blood in her urine and from her vagina.  She thought she might have had a kidney stone.  She used her strainer but did not  find anything. The blood has now resolved, no dysuria.   No ST, no cough.   She felt nauseated this am but did not vomit.  States she has been unable to take PO due to nausea.  She took a phenergan this morning.    Of note she has had a high white blood cell count intermittently for several years, and actually had a bone marrow bx in the past which she reports was normal.   She reports that her wbc count tends to go up and down.  She also reports that she was told her appendix was "inflamed" when she had her hysterectomy in 2001- however, when she followed up with a surgeon she was told her appendix did not need to be removed.  She was actually seen with RLQ tenderness and leukocytosis 2 months ago and had another CT scan which did not show appendicitis.   Patient Active Problem List   Diagnosis Date Noted  . Chest pain 05/18/2012  . Endometriosis 12/08/2011  . Arthritis of left knee 12/08/2011  . Diabetic retinopathy 12/08/2011  . Fibromyalgia 10/23/2011  . DM type 1 (diabetes mellitus, type 1) 10/13/2011    Past Medical History  Diagnosis Date  . Diabetes mellitus   . Fibromyalgia   .  Arthritis   . Hyperlipidemia   . Chills   . Abdominal pain   . Nausea & vomiting   . Biliary dyskinesia   . Depression   . Migraine   . Joint pain   . Skin rash     due to medications  . Post traumatic stress disorder (PTSD)   . Sleep trouble   . Major depressive disorder   . Cervical strain   . Closed head injury   . Renal stones     Past Surgical History  Procedure Laterality Date  . Tonsillectomy  2001  . Lipoma removed    . Knee surgery    . Cesarean section  1997, 1999  . Abdominal hysterectomy  2001  . Tumor removal  2000    lower back  . Eye surgery  2006    laser eye surgery left eye  . Cholecystectomy  02/10/2011    Procedure: LAPAROSCOPIC CHOLECYSTECTOMY WITH INTRAOPERATIVE CHOLANGIOGRAM;  Surgeon: Rulon Abide, DO;  Location: [facility] OR;  Service: General;  Laterality:  N/A;  . Robotic assisted laparoscopic lysis of adhesion N/A 03/04/2012    Procedure: ROBOTIC ASSISTED LAPAROSCOPIC LYSIS OF EXTENSIVE ADHESIONS;  Surgeon: Tresa Endo A. Ernestina Penna, MD;  Location: [facility];  Service: Gynecology;  Laterality: N/A;    History  Substance Use Topics  . Smoking status: Never Smoker   . Smokeless tobacco: Never Used  . Alcohol Use: No    Family History  Problem Relation Age of Onset  . Cancer Father     lymphoma  . Nephrolithiasis Father   . Vascular Disease Father   . Cancer Maternal Grandmother     colon  . Hypertension Mother   . Heart disease Mother   . Cataracts Mother   . Diabetes Brother   . Mental illness Maternal Grandfather   . Cancer Maternal Grandfather   . Heart disease Paternal Grandmother   . Heart disease Paternal Grandfather     Allergies  Allergen Reactions  . Meloxicam Nausea Only  . Cephalexin Rash  . Ibuprofen Nausea And Vomiting and Rash    Medication list has been reviewed and updated.  Current Outpatient Prescriptions on File Prior to Visit  Medication Sig Dispense Refill  . acetaminophen (TYLENOL) 500 MG tablet Take 1,000 mg by mouth every 6 (six) hours as needed for pain.      Marland Kitchen insulin aspart (NOVOLOG) 100 UNIT/ML injection Inject 8 Units into the skin 3 (three) times daily before meals.      . insulin glargine (LANTUS) 100 UNIT/ML injection Inject 28 Units into the skin at bedtime.       . promethazine (PHENERGAN) 25 MG tablet Take 1 tablet (25 mg total) by mouth every 6 (six) hours as needed for nausea.  12 tablet  0  . aspirin EC 81 MG tablet Take 81 mg by mouth daily.      Marland Kitchen atorvastatin (LIPITOR) 40 MG tablet Take 40 mg by mouth every morning.      Marland Kitchen HYDROcodone-acetaminophen (NORCO/VICODIN) 5-325 MG per tablet Take 1 tablets by mouth every 8  hours as needed for pain.  30 tablet  0  . methocarbamol (ROBAXIN) 500 MG tablet Take 1 tablet (500 mg total) by mouth at bedtime as needed.  30 tablet  0   No current  facility-administered medications on file prior to visit.    Review of Systems:  As per HPI- otherwise negative.   Physical Examination: Filed Vitals:   09/13/12 1313  BP: 124/82  Pulse: 100  Temp: 98.5 F (36.9 C)  Resp: 20   Filed Vitals:   09/13/12 1313  Height: 5\' 4"  (1.626 m)  Weight: 176 lb 6.4 oz (80.015 kg)   Body mass index is 30.26 kg/(m^2). Ideal Body Weight: Weight in (lb) to have BMI = 25: 145.3  GEN: WDWN, NAD, Non-toxic, A & O x 3, overweight HEENT: Atraumatic, Normocephalic. Neck supple. No masses, No LAD.  Bilateral TM wnl, oropharynx normal.  PEERL,EOMI.   Ears and Nose: No external deformity. CV: RRR, No M/G/R. No JVD. No thrill. No extra heart sounds. PULM: CTA B, no wheezes, crackles, rhonchi. No retractions. No resp. distress. No accessory muscle use. ABD: S, NT, ND, +BS. No rebound. No HSM. EXTR: No c/c/e NEURO Normal gait.  PSYCH: Normally interactive. Conversant. Not depressed or anxious appearing.  Calm demeanor.    Results for orders placed in visit on 09/13/12  POCT CBC      Result Value Range   WBC 15.2 (*) 4.6 - 10.2 K/uL   Lymph, poc 1.6  0.6 - 3.4   POC LYMPH PERCENT 10.8  10 - 50 %L   MID (cbc) 0.6  0 - 0.9   POC MID % 4.0  0 - 12 %M   POC Granulocyte 13.0 (*) 2 - 6.9   Granulocyte percent 85.2 (*) 37 - 80 %G   RBC 4.66  4.04 - 5.48 M/uL   Hemoglobin 14.8  12.2 - 16.2 g/dL   HCT, POC 16.1  09.6 - 47.9 %   MCV 98.7 (*) 80 - 97 fL   MCH, POC 31.8 (*) 27 - 31.2 pg   MCHC 32.2  31.8 - 35.4 g/dL   RDW, POC 04.5     Platelet Count, POC 450 (*) 142 - 424 K/uL   MPV 9.0  0 - 99.8 fL  GLUCOSE, POCT (MANUAL RESULT ENTRY)      Result Value Range   POC Glucose 260 (*) 70 - 99 mg/dl  POCT URINALYSIS DIPSTICK      Result Value Range   Color, UA amber     Clarity, UA cloudy     Glucose, UA >1000     Bilirubin, UA small     Ketones, UA 40     Spec Grav, UA >=1.030     Blood, UA trace-lysed     pH, UA 5.0     Protein, UA >300      Urobilinogen, UA 0.2     Nitrite, UA neg     Leukocytes, UA Negative    POCT UA - MICROSCOPIC ONLY      Result Value Range   WBC, Ur, HPF, POC 3-4     RBC, urine, microscopic 0-2     Bacteria, U Microscopic 1+     Mucus, UA neg     Epithelial cells, urine per micros 6-8     Crystals, Ur, HPF, POC neg     Casts, Ur, LPF, POC neg     Yeast, UA neg      [facility] reading (PRIMARY) by  Dr. Patsy Lager. Lumbar spine:  negative Thoracic spine:   negative  THORACIC SPINE - 2 VIEW  Comparison: None  Findings: Negative for fracture or mass. Mild thoracic disc degeneration with mild disc space narrowing and minimal spurring. No focal bony lesion. Minimal scoliosis may be positional.  IMPRESSION: Negative  Clinically significant discrepancy from primary report, if provided: None  LUMBAR SPINE - 2-3 VIEW  Comparison: None  Findings: Normal alignment. Negative for fracture or mass. No pars defect. Disc spaces are normal. Surgical clips in the gallbladder fossa.  Transitional anatomy. L5 is partially incorporated into the sacrum.  IMPRESSION: Negative lumbar spine.  Clinically significant discrepancy from primary report, if provided: None   Assessment and Plan: Back pain - Plan: DG Lumbar Spine 2-3 Views, DG Thoracic Spine 2 View, POCT urinalysis dipstick, POCT UA - Microscopic Only, Urine culture  Diabetes mellitus type 1 - Plan: POCT glucose (manual entry)  Itching - Plan: POCT CBC, CANCELED: Comprehensive metabolic panel  Vaginal discharge - Plan: POCT Wet Prep with KOH  Jaquisha is here today with vague sx of malaise, fatigue, pain all over her body, as well as ketones and glucose in her urine, nausea and poor po intake, and leukocytosis (which may be her baseline).  I am concerned that she could have ketosis and may need IV hydration or other treatment for her DM.  We are not able to do a chemistry profile here.  She will proceed to the ER for further evaluation.  Appreciate  ER care of this patient.    I have ordered a urine culture but canceled her CMP as this will not be received today   Signed Abbe Amsterdam, MD

## 2012-09-13 NOTE — Patient Instructions (Addendum)
Please proceed to the ER at [facility]  Address: [address] [place], [place] [address] [phone]  They can check your electrolytes and kidney function, and give you fluids if necessary.    I will order a culture on your urine and let you know when I receive these results.

## 2012-09-13 NOTE — ED Provider Notes (Signed)
CSN: [id number]     Arrival date & time 09/13/12  1543 History   First MD Initiated Contact with Patient 09/13/12 1625     Chief Complaint  Patient presents with  . Back Pain   (Consider location/radiation/quality/duration/timing/severity/associated sxs/prior Treatment) Patient is a 42 y.o. female presenting with back pain. The history is provided by the patient. No language interpreter was used.  Back Pain Location:  Lumbar spine Quality:  Aching and burning Pain severity:  Moderate Pain is:  Same all the time Onset quality:  Gradual Duration:  2 days Timing:  Constant Progression:  Worsening Chronicity:  New Relieved by:  Nothing Worsened by:  Nothing tried Associated symptoms: fever   Associated symptoms: no abdominal pain     Past Medical History  Diagnosis Date  . Diabetes mellitus   . Fibromyalgia   . Arthritis   . Hyperlipidemia   . Chills   . Abdominal pain   . Nausea & vomiting   . Biliary dyskinesia   . Depression   . Migraine   . Joint pain   . Skin rash     due to medications  . Post traumatic stress disorder (PTSD)   . Sleep trouble   . Major depressive disorder   . Cervical strain   . Closed head injury   . Renal stones    Past Surgical History  Procedure Laterality Date  . Tonsillectomy  2001  . Lipoma removed    . Knee surgery    . Cesarean section  1997, 1999  . Abdominal hysterectomy  2001  . Tumor removal  2000    lower back  . Eye surgery  2006    laser eye surgery left eye  . Cholecystectomy  02/10/2011    Procedure: LAPAROSCOPIC CHOLECYSTECTOMY WITH INTRAOPERATIVE CHOLANGIOGRAM;  Surgeon: Rulon Abide, DO;  Location: [facility] OR;  Service: General;  Laterality: N/A;  . Robotic assisted laparoscopic lysis of adhesion N/A 03/04/2012    Procedure: ROBOTIC ASSISTED LAPAROSCOPIC LYSIS OF EXTENSIVE ADHESIONS;  Surgeon: Tresa Endo A. Ernestina Penna, MD;  Location: [facility] OR[facility];  Service: Gynecology;  Laterality: N/A;   Family History  Problem Relation Age  of Onset  . Cancer Father     lymphoma  . Nephrolithiasis Father   . Vascular Disease Father   . Cancer Maternal Grandmother     colon  . Hypertension Mother   . Heart disease Mother   . Cataracts Mother   . Diabetes Brother   . Mental illness Maternal Grandfather   . Cancer Maternal Grandfather   . Heart disease Paternal Grandmother   . Heart disease Paternal Grandfather    History  Substance Use Topics  . Smoking status: Never Smoker   . Smokeless tobacco: Never Used  . Alcohol Use: No   OB History   Grav Para Term Preterm Abortions TAB SAB Ect Mult Living                 Review of Systems  Constitutional: Positive for fever.  Gastrointestinal: Negative for abdominal pain.  Musculoskeletal: Positive for back pain.  All other systems reviewed and are negative.    Allergies  Meloxicam; Cephalexin; and Ibuprofen  Home Medications   Current Outpatient Rx  Name  Route  Sig  Dispense  Refill  . acetaminophen (TYLENOL) 500 MG tablet   Oral   Take 1,000 mg by mouth every 6 (six) hours as needed for pain.         Marland Kitchen aspirin EC 81  MG tablet   Oral   Take 81 mg by mouth daily.         Marland Kitchen atorvastatin (LIPITOR) 40 MG tablet   Oral   Take 40 mg by mouth every morning.         Marland Kitchen HYDROcodone-acetaminophen (NORCO/VICODIN) 5-325 MG per tablet      Take 1 tablets by mouth every 8  hours as needed for pain.   30 tablet   0   . insulin aspart (NOVOLOG) 100 UNIT/ML injection   Subcutaneous   Inject 8 Units into the skin 3 (three) times daily before meals.         . insulin glargine (LANTUS) 100 UNIT/ML injection   Subcutaneous   Inject 28 Units into the skin at bedtime.          . methocarbamol (ROBAXIN) 500 MG tablet   Oral   Take 1 tablet (500 mg total) by mouth at bedtime as needed.   30 tablet   0   . promethazine (PHENERGAN) 25 MG tablet   Oral   Take 1 tablet (25 mg total) by mouth every 6 (six) hours as needed for nausea.   12 tablet   0     BP 127/84  Pulse 98  Temp(Src) 98.4 F (36.9 C) (Oral)  Resp 16  SpO2 100% Physical Exam  Nursing note and vitals reviewed. Constitutional: She appears well-developed and well-nourished.  HENT:  Head: Normocephalic.  Right Ear: External ear normal.  Left Ear: External ear normal.  Eyes: Conjunctivae are normal. Pupils are equal, round, and reactive to light.  Neck: Normal range of motion. Neck supple.  Cardiovascular: Normal rate and normal heart sounds.   Pulmonary/Chest: Effort normal and breath sounds normal.  Abdominal: Soft. There is no tenderness.  Musculoskeletal:  Multiple scratches low back,  No evidence of infection  Neurological: She is alert.  Skin: Skin is warm.  Psychiatric: She has a normal mood and affect.    ED Course  Procedures (including critical care time) Labs Review Labs Reviewed  COMPREHENSIVE METABOLIC PANEL - Abnormal; Notable for the following:    Chloride 95 (*)    Glucose, Bld 299 (*)    Calcium 11.0 (*)    Total Protein 8.8 (*)    Alkaline Phosphatase 154 (*)    GFR calc non Af Amer 80 (*)    All other components within normal limits  CBC WITH DIFFERENTIAL - Abnormal; Notable for the following:    WBC 16.3 (*)    Hemoglobin 15.9 (*)    Platelets 432 (*)    Neutrophils Relative % 84 (*)    Neutro Abs 13.7 (*)    Lymphocytes Relative 11 (*)    All other components within normal limits   Imaging Review Ct Abdomen Pelvis Wo Contrast  09/13/2012   *RADIOLOGY REPORT*  Clinical Data: Back pain, hematuria  CT ABDOMEN AND PELVIS WITHOUT CONTRAST  Technique:  Multidetector CT imaging of the abdomen and pelvis was performed following the standard protocol without intravenous contrast.  Comparison: 07/19/2012, 07/17/2011  Findings: Limited images through the lung bases demonstrate no significant appreciable abnormality. The heart size is within normal limits. No pleural or pericardial effusion.  Organ abnormality/lesion detection is limited in the  absence of intravenous contrast. Within this limitation, unremarkable liver. Cholecystectomy.  No biliary ductal dilatation.  Unremarkable spleen, pancreas, adrenal glands.  Symmetric renal size.  No hydroureteronephrosis. The left renal calcification is favored to be vascular though a tiny nonobstructing  stone is not excluded.  No ureteral calculi identified.  Decompressed colon limits evaluation.  No overt colitis.  The appendix is upper normal to mildly dilated at 7 mm as seen on coronal image 47.  No periappendiceal inflammation.  Small bowel loops are normal course and caliber.  No free intraperitoneal air. Trace free fluid within the pelvis.  No lymphadenopathy. Mild stranding within the small bowel root mesentery as seen on series 2 image 43.  There is scattered atherosclerotic calcification of the aorta and its branches. No aneurysmal dilatation.  Circumferential bladder wall thickening, nonspecific given incomplete distension.  There is nonspecific thickening at the vaginal cuff with mild surrounding fat stranding.  Nodular soft tissue along the ventral peritoneal surface at the level of the bladder image 66 series 2 is similar to prior.  No acute osseous finding.  IMPRESSION: No ureteral calculi or hydroureteronephrosis.  Appendix upper normal to mildly prominent in diameter, similar to prior.  No periappendiceal  fat stranding to favor acute inflammation.  Correlate clinically.  Mild fat stranding within the pelvis abuts the vaginal cuff and posterior bladder wall.  Correlate clinically for pelvic inflammatory/infectious conditions or cystitis.  Unchanged nodular soft tissue along the midline anterior pelvic wall.  This is favored to reflect scar tissue in the setting of prior C-sections and hysterectomy.  Endometrial implant if there is a history of endometriosis is also a consideration; appears similar to priors.  Mild mesenteric root fat stranding may be tracking superiorly from the pelvic process or  reflect a nonspecific mesenteritis or enteritis.   Original Report Authenticated By: Jearld Lesch, M.D.   Dg Thoracic Spine 2 View  09/13/2012   *RADIOLOGY REPORT*  Clinical Data: Back pain  THORACIC SPINE - 2 VIEW  Comparison: None  Findings: Negative for fracture or mass.  Mild thoracic disc degeneration with mild disc space narrowing and minimal spurring. No focal bony lesion.  Minimal scoliosis may be positional.  IMPRESSION: Negative  Clinically significant discrepancy from primary report, if provided: None   Original Report Authenticated By: Janeece Riggers, M.D.   Dg Lumbar Spine 2-3 Views  09/13/2012   *RADIOLOGY REPORT*  Clinical Data: Back pain  LUMBAR SPINE - 2-3 VIEW  Comparison: None  Findings: Normal alignment.  Negative for fracture or mass.  No pars defect.  Disc spaces are normal.  Surgical clips in the gallbladder fossa.  Transitional anatomy.  L5 is partially incorporated into the sacrum.  IMPRESSION: Negative lumbar spine.  Clinically significant discrepancy from primary report, if provided: None   Original Report Authenticated By: Janeece Riggers, M.D.    MDM   1. Back pain    Pt has slightly evevated wbc's.  Ua at Urgent care is negative.   I obtained ct no evidence of stone.   Pt does not have abdominal pain,  I don't think this is appendix as all pt is in back.   I will treat pain.   Pt advised to recheck here in 12 hours.   If rash develops may be shingles,   Pt has a history of appendix being enlarged,  Seen at [facility] for the same   Elson Areas, PA-C 09/13/12 2142  Lonia Skinner [place], PA-C 09/13/12 2158

## 2012-09-14 ENCOUNTER — Emergency Department (HOSPITAL_BASED_OUTPATIENT_CLINIC_OR_DEPARTMENT_OTHER)
Admission: EM | Admit: 2012-09-14 | Discharge: 2012-09-14 | Disposition: A | Discharge status: Home / Self Care | Payer: Managed Care, Other (non HMO) | Attending: Emergency Medicine | Admitting: Emergency Medicine

## 2012-09-14 ENCOUNTER — Encounter (HOSPITAL_BASED_OUTPATIENT_CLINIC_OR_DEPARTMENT_OTHER): Payer: Self-pay | Admitting: *Deleted

## 2012-09-14 DIAGNOSIS — Z8679 Personal history of other diseases of the circulatory system: Secondary | ICD-10-CM | POA: Insufficient documentation

## 2012-09-14 DIAGNOSIS — Z8659 Personal history of other mental and behavioral disorders: Secondary | ICD-10-CM | POA: Insufficient documentation

## 2012-09-14 DIAGNOSIS — M129 Arthropathy, unspecified: Secondary | ICD-10-CM | POA: Insufficient documentation

## 2012-09-14 DIAGNOSIS — Z872 Personal history of diseases of the skin and subcutaneous tissue: Secondary | ICD-10-CM | POA: Insufficient documentation

## 2012-09-14 DIAGNOSIS — Z794 Long term (current) use of insulin: Secondary | ICD-10-CM | POA: Insufficient documentation

## 2012-09-14 DIAGNOSIS — E785 Hyperlipidemia, unspecified: Secondary | ICD-10-CM | POA: Insufficient documentation

## 2012-09-14 DIAGNOSIS — M549 Dorsalgia, unspecified: Secondary | ICD-10-CM | POA: Insufficient documentation

## 2012-09-14 DIAGNOSIS — Z09 Encounter for follow-up examination after completed treatment for conditions other than malignant neoplasm: Secondary | ICD-10-CM | POA: Insufficient documentation

## 2012-09-14 DIAGNOSIS — Z8719 Personal history of other diseases of the digestive system: Secondary | ICD-10-CM | POA: Insufficient documentation

## 2012-09-14 DIAGNOSIS — Z87828 Personal history of other (healed) physical injury and trauma: Secondary | ICD-10-CM | POA: Insufficient documentation

## 2012-09-14 DIAGNOSIS — Z7982 Long term (current) use of aspirin: Secondary | ICD-10-CM | POA: Insufficient documentation

## 2012-09-14 DIAGNOSIS — Z79899 Other long term (current) drug therapy: Secondary | ICD-10-CM | POA: Insufficient documentation

## 2012-09-14 DIAGNOSIS — E109 Type 1 diabetes mellitus without complications: Secondary | ICD-10-CM | POA: Insufficient documentation

## 2012-09-14 DIAGNOSIS — Z87442 Personal history of urinary calculi: Secondary | ICD-10-CM | POA: Insufficient documentation

## 2012-09-14 DIAGNOSIS — Z8669 Personal history of other diseases of the nervous system and sense organs: Secondary | ICD-10-CM | POA: Insufficient documentation

## 2012-09-14 DIAGNOSIS — Z8739 Personal history of other diseases of the musculoskeletal system and connective tissue: Secondary | ICD-10-CM | POA: Insufficient documentation

## 2012-09-14 LAB — CBC WITH DIFFERENTIAL/PLATELET
Basophils Absolute: 0 10*3/uL (ref 0.0–0.1)
Basophils Relative: 0 % (ref 0–1)
Eosinophils Absolute: 0.1 10*3/uL (ref 0.0–0.7)
Eosinophils Relative: 1 % (ref 0–5)
HCT: 42.9 % (ref 36.0–46.0)
Hemoglobin: 14.7 g/dL (ref 12.0–15.0)
Lymphocytes Relative: 25 % (ref 12–46)
Lymphs Abs: 3.2 10*3/uL (ref 0.7–4.0)
MCH: 31.9 pg (ref 26.0–34.0)
MCHC: 34.3 g/dL (ref 30.0–36.0)
MCV: 93.1 fL (ref 78.0–100.0)
Monocytes Absolute: 0.8 10*3/uL (ref 0.1–1.0)
Monocytes Relative: 6 % (ref 3–12)
Neutro Abs: 8.7 10*3/uL — ABNORMAL HIGH (ref 1.7–7.7)
Neutrophils Relative %: 68 % (ref 43–77)
Platelets: 404 10*3/uL — ABNORMAL HIGH (ref 150–400)
RBC: 4.61 MIL/uL (ref 3.87–5.11)
RDW: 13 % (ref 11.5–15.5)
WBC: 12.8 10*3/uL — ABNORMAL HIGH (ref 4.0–10.5)

## 2012-09-14 NOTE — ED Provider Notes (Addendum)
CSN: [id number]     Arrival date & time 09/14/12  1253 History   First MD Initiated Contact with Patient 09/14/12 1303     Chief Complaint  Patient presents with  . sched return    (Consider location/radiation/quality/duration/timing/severity/associated sxs/prior Treatment) HPI Comments: Pt returns for recheck.  Seen here yesterday and advised to return to recheck WBC count.  Pt had CT yesterday which showed same findings as prior and no kidney stone found however pt does have type 1 DM and was having N/V/D.  Pt was hydrated and given meds and today states she feels much better.  Denies any further vomiting or diarrhea.  Was able to eat and drink at home.  Pt states back pain has significantly improved.  The history is provided by the patient.    Past Medical History  Diagnosis Date  . Diabetes mellitus   . Fibromyalgia   . Arthritis   . Hyperlipidemia   . Chills   . Abdominal pain   . Nausea & vomiting   . Biliary dyskinesia   . Depression   . Migraine   . Joint pain   . Skin rash     due to medications  . Post traumatic stress disorder (PTSD)   . Sleep trouble   . Major depressive disorder   . Cervical strain   . Closed head injury   . Renal stones    Past Surgical History  Procedure Laterality Date  . Tonsillectomy  2001  . Lipoma removed    . Knee surgery    . Cesarean section  1997, 1999  . Abdominal hysterectomy  2001  . Tumor removal  2000    lower back  . Eye surgery  2006    laser eye surgery left eye  . Cholecystectomy  02/10/2011    Procedure: LAPAROSCOPIC CHOLECYSTECTOMY WITH INTRAOPERATIVE CHOLANGIOGRAM;  Surgeon: Rulon Abide, DO;  Location: [facility] OR;  Service: General;  Laterality: N/A;  . Robotic assisted laparoscopic lysis of adhesion N/A 03/04/2012    Procedure: ROBOTIC ASSISTED LAPAROSCOPIC LYSIS OF EXTENSIVE ADHESIONS;  Surgeon: Tresa Endo A. Ernestina Penna, MD;  Location: [facility];  Service: Gynecology;  Laterality: N/A;   Family History  Problem Relation  Age of Onset  . Cancer Father     lymphoma  . Nephrolithiasis Father   . Vascular Disease Father   . Cancer Maternal Grandmother     colon  . Hypertension Mother   . Heart disease Mother   . Cataracts Mother   . Diabetes Brother   . Mental illness Maternal Grandfather   . Cancer Maternal Grandfather   . Heart disease Paternal Grandmother   . Heart disease Paternal Grandfather    History  Substance Use Topics  . Smoking status: Never Smoker   . Smokeless tobacco: Never Used  . Alcohol Use: No   OB History   Grav Para Term Preterm Abortions TAB SAB Ect Mult Living                 Review of Systems  Constitutional: Negative for fever.  Respiratory: Negative for cough and shortness of breath.   Gastrointestinal: Negative for nausea, vomiting, diarrhea and constipation.  Musculoskeletal: Positive for back pain.  All other systems reviewed and are negative.    Allergies  Meloxicam; Cephalexin; and Ibuprofen  Home Medications   Current Outpatient Rx  Name  Route  Sig  Dispense  Refill  . acetaminophen (TYLENOL) 500 MG tablet   Oral   Take 1,000  mg by mouth every 6 (six) hours as needed for pain.         Marland Kitchen aspirin EC 81 MG tablet   Oral   Take 81 mg by mouth daily.         Marland Kitchen atorvastatin (LIPITOR) 40 MG tablet   Oral   Take 40 mg by mouth every morning.         Marland Kitchen HYDROcodone-acetaminophen (NORCO/VICODIN) 5-325 MG per tablet      Take 1 tablets by mouth every 8  hours as needed for pain.   30 tablet   0   . HYDROcodone-acetaminophen (NORCO/VICODIN) 5-325 MG per tablet   Oral   Take 2 tablets by mouth every 4 (four) hours as needed.   16 tablet   0   . insulin aspart (NOVOLOG) 100 UNIT/ML injection   Subcutaneous   Inject 8 Units into the skin 3 (three) times daily before meals.         . insulin glargine (LANTUS) 100 UNIT/ML injection   Subcutaneous   Inject 28 Units into the skin at bedtime.          . methocarbamol (ROBAXIN) 500 MG  tablet   Oral   Take 1 tablet (500 mg total) by mouth at bedtime as needed.   30 tablet   0   . promethazine (PHENERGAN) 25 MG tablet   Oral   Take 1 tablet (25 mg total) by mouth every 6 (six) hours as needed for nausea.   12 tablet   0    BP 126/67  Pulse 80  Temp(Src) 98 F (36.7 C) (Oral)  Resp 16  Ht 5\' 4"  (1.626 m)  Wt 175 lb (79.379 kg)  BMI 30.02 kg/m2  SpO2 99% Physical Exam  Nursing note and vitals reviewed. Constitutional: She is oriented to person, place, and time. She appears well-developed and well-nourished. No distress.  HENT:  Head: Normocephalic and atraumatic.  Eyes: EOM are normal. Pupils are equal, round, and reactive to light.  Cardiovascular: Normal rate, regular rhythm, normal heart sounds and intact distal pulses.  Exam reveals no friction rub.   No murmur heard. Pulmonary/Chest: Effort normal and breath sounds normal. She has no wheezes. She has no rales.  Abdominal: Soft. Bowel sounds are normal. She exhibits no distension. There is no tenderness. There is no rebound and no guarding.  Musculoskeletal: Normal range of motion. She exhibits tenderness.  No edema.  Mild bilateral paralumbar tenderness.  Neurological: She is alert and oriented to person, place, and time. No cranial nerve deficit.  Skin: Skin is warm and dry. No rash noted.  Psychiatric: She has a normal mood and affect. Her behavior is normal.    ED Course  Procedures (including critical care time) Labs Review Labs Reviewed  CBC WITH DIFFERENTIAL - Abnormal; Notable for the following:    WBC 12.8 (*)    Platelets 404 (*)    Neutro Abs 8.7 (*)    All other components within normal limits   Imaging Review Ct Abdomen Pelvis Wo Contrast  09/13/2012   *RADIOLOGY REPORT*  Clinical Data: Back pain, hematuria  CT ABDOMEN AND PELVIS WITHOUT CONTRAST  Technique:  Multidetector CT imaging of the abdomen and pelvis was performed following the standard protocol without intravenous contrast.   Comparison: 07/19/2012, 07/17/2011  Findings: Limited images through the lung bases demonstrate no significant appreciable abnormality. The heart size is within normal limits. No pleural or pericardial effusion.  Organ abnormality/lesion detection is limited in  the absence of intravenous contrast. Within this limitation, unremarkable liver. Cholecystectomy.  No biliary ductal dilatation.  Unremarkable spleen, pancreas, adrenal glands.  Symmetric renal size.  No hydroureteronephrosis. The left renal calcification is favored to be vascular though a tiny nonobstructing stone is not excluded.  No ureteral calculi identified.  Decompressed colon limits evaluation.  No overt colitis.  The appendix is upper normal to mildly dilated at 7 mm as seen on coronal image 47.  No periappendiceal inflammation.  Small bowel loops are normal course and caliber.  No free intraperitoneal air. Trace free fluid within the pelvis.  No lymphadenopathy. Mild stranding within the small bowel root mesentery as seen on series 2 image 43.  There is scattered atherosclerotic calcification of the aorta and its branches. No aneurysmal dilatation.  Circumferential bladder wall thickening, nonspecific given incomplete distension.  There is nonspecific thickening at the vaginal cuff with mild surrounding fat stranding.  Nodular soft tissue along the ventral peritoneal surface at the level of the bladder image 66 series 2 is similar to prior.  No acute osseous finding.  IMPRESSION: No ureteral calculi or hydroureteronephrosis.  Appendix upper normal to mildly prominent in diameter, similar to prior.  No periappendiceal  fat stranding to favor acute inflammation.  Correlate clinically.  Mild fat stranding within the pelvis abuts the vaginal cuff and posterior bladder wall.  Correlate clinically for pelvic inflammatory/infectious conditions or cystitis.  Unchanged nodular soft tissue along the midline anterior pelvic wall.  This is favored to reflect  scar tissue in the setting of prior C-sections and hysterectomy.  Endometrial implant if there is a history of endometriosis is also a consideration; appears similar to priors.  Mild mesenteric root fat stranding may be tracking superiorly from the pelvic process or reflect a nonspecific mesenteritis or enteritis.   Original Report Authenticated By: Jearld Lesch, M.D.   Dg Thoracic Spine 2 View  09/13/2012   *RADIOLOGY REPORT*  Clinical Data: Back pain  THORACIC SPINE - 2 VIEW  Comparison: None  Findings: Negative for fracture or mass.  Mild thoracic disc degeneration with mild disc space narrowing and minimal spurring. No focal bony lesion.  Minimal scoliosis may be positional.  IMPRESSION: Negative  Clinically significant discrepancy from primary report, if provided: None   Original Report Authenticated By: Janeece Riggers, M.D.   Dg Lumbar Spine 2-3 Views  09/13/2012   *RADIOLOGY REPORT*  Clinical Data: Back pain  LUMBAR SPINE - 2-3 VIEW  Comparison: None  Findings: Normal alignment.  Negative for fracture or mass.  No pars defect.  Disc spaces are normal.  Surgical clips in the gallbladder fossa.  Transitional anatomy.  L5 is partially incorporated into the sacrum.  IMPRESSION: Negative lumbar spine.  Clinically significant discrepancy from primary report, if provided: None   Original Report Authenticated By: Janeece Riggers, M.D.    MDM   1. Follow-up exam after treatment     Pt presented today for recheck.  She is a type I DM and seen here yesterday for back pain, N/V/D with Neg CT and no findings today concerning for shingles who is presenting for 24hr recheck.  Pt is feeling better today.  No further N/V and sugars more controlled today.  Only mild pain in the back today. Back here to recheck WBC count as yesterday it was 16.  No abd pain here today and only mild lumbar tenderness.  CBC pending.  Today VS wnl and pt well appearing.  1:36 PM WBC count improved today  to 12.  Pt feeling better  and will d/c home to f/u with PCP as planned.  Gwyneth Sprout, MD 09/14/12 1336  Gwyneth Sprout, MD 09/14/12 1352

## 2012-09-14 NOTE — ED Notes (Signed)
Pt reports low back pain and states pain is manageable and better than yesterday.  MAE WNL.

## 2012-09-14 NOTE — ED Notes (Signed)
Pt here for sched return to check elevated WBC seen here yesterday

## 2012-09-14 NOTE — ED Provider Notes (Signed)
Medical screening examination/treatment/procedure(s) were performed by non-physician practitioner and as supervising physician I was immediately available for consultation/collaboration.   Audree Camel, MD 09/14/12 1150

## 2012-09-15 LAB — URINE CULTURE
Colony Count: NO GROWTH
Organism ID, Bacteria: NO GROWTH

## 2012-09-24 ENCOUNTER — Encounter: Payer: Self-pay | Admitting: Family Medicine

## 2012-09-24 ENCOUNTER — Ambulatory Visit (INDEPENDENT_AMBULATORY_CARE_PROVIDER_SITE_OTHER): Payer: Managed Care, Other (non HMO) | Admitting: Family Medicine

## 2012-09-24 VITALS — BP 118/66 | HR 74 | Temp 98.0°F | Resp 16 | Ht 62.75 in | Wt 176.4 lb

## 2012-09-24 DIAGNOSIS — G8929 Other chronic pain: Secondary | ICD-10-CM

## 2012-09-24 DIAGNOSIS — R1031 Right lower quadrant pain: Secondary | ICD-10-CM

## 2012-09-24 NOTE — Progress Notes (Signed)
42 yo woman with multiple ED visits over 2 years.  She had gynecologic surgery during which the appendix was noted to be inflamed.  She continues to run fevers and have white blood cell count elevations.  The blood sugars have been totally out of control because of chronic nausea necessitating fluids in the face of dehydration and no appetite.  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  She cannot have a personal life because of the pain and nausea.  Patient is tired of repeat ED visits, receiving pain medicine but no resolution.  Objective:  Alert, good eye contact Chest:  Clear Heart:  Regular with I/VI systolic ejection type murmur Abdomen:  Soft, some guarding right side, no rebound, tender lower abdomen-particularly right lower quadrant Extrem:  Excoriated lower legs with no edema CT ABDOMEN AND PELVIS WITHOUT CONTRAST  Technique: Multidetector CT imaging of the abdomen and pelvis was  performed following the standard protocol without intravenous  contrast.  Comparison: 07/19/2012, 07/17/2011  Findings: Limited images through the lung bases demonstrate no  significant appreciable abnormality. The heart size is within  normal limits. No pleural or pericardial effusion.  Organ abnormality/lesion detection is limited in the absence of  intravenous contrast. Within this limitation, unremarkable liver.  Cholecystectomy. No biliary ductal dilatation. Unremarkable  spleen, pancreas, adrenal glands.  Symmetric renal size. No hydroureteronephrosis. The left renal  calcification is favored to be vascular though a tiny  nonobstructing stone is not excluded. No ureteral calculi  identified.  Decompressed colon limits evaluation. No overt colitis. The  appendix is upper normal to mildly dilated at 7 mm as seen on  coronal image 47. No periappendiceal inflammation. Small bowel  loops are normal course and caliber. No free intraperitoneal air.  Trace free fluid within the pelvis. No  lymphadenopathy. Mild  stranding within the small bowel root mesentery as seen on series 2  image 43.  There is scattered atherosclerotic calcification of the aorta and  its branches. No aneurysmal dilatation.  Circumferential bladder wall thickening, nonspecific given  incomplete distension. There is nonspecific thickening at the  vaginal cuff with mild surrounding fat stranding. Nodular soft  tissue along the ventral peritoneal surface at the level of the  bladder image 66 series 2 is similar to prior.  No acute osseous finding.  IMPRESSION:  No ureteral calculi or hydroureteronephrosis.  Appendix upper normal to mildly prominent in diameter, similar to  prior. No periappendiceal fat stranding to favor acute  inflammation. Correlate clinically.  Mild fat stranding within the pelvis abuts the vaginal cuff and  posterior bladder wall. Correlate clinically for pelvic  inflammatory/infectious conditions or cystitis.  Unchanged nodular soft tissue along the midline anterior pelvic  wall. This is favored to reflect scar tissue in the setting of  prior C-sections and hysterectomy. Endometrial implant if there is  a history of endometriosis is also a consideration; appears similar  to priors.  Mild mesenteric root fat stranding may be tracking superiorly from  the pelvic process or reflect a nonspecific mesenteritis or  enteritis.  Original Report Authenticated By: Jearld Lesch, M.D.  Results for orders placed during the hospital encounter of 09/14/12  CBC WITH DIFFERENTIAL      Result Value Range   WBC 12.8 (*) 4.0 - 10.5 K/uL   RBC 4.61  3.87 - 5.11 MIL/uL   Hemoglobin 14.7  12.0 - 15.0 g/dL   HCT 96.0  45.4 - 09.8 %   MCV 93.1  78.0 - 100.0 fL  MCH 31.9  26.0 - 34.0 pg   MCHC 34.3  30.0 - 36.0 g/dL   RDW 78.2  95.6 - 21.3 %   Platelets 404 (*) 150 - 400 K/uL   Neutrophils Relative % 68  43 - 77 %   Neutro Abs 8.7 (*) 1.7 - 7.7 K/uL   Lymphocytes Relative 25  12 - 46 %    Lymphs Abs 3.2  0.7 - 4.0 K/uL   Monocytes Relative 6  3 - 12 %   Monocytes Absolute 0.8  0.1 - 1.0 K/uL   Eosinophils Relative 1  0 - 5 %   Eosinophils Absolute 0.1  0.0 - 0.7 K/uL   Basophils Relative 0  0 - 1 %   Basophils Absolute 0.0  0.0 - 0.1 K/uL   Assessment:  This really seems to me like a case of "chronic appendicitis", 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  Given Dr. Camillia Herter findings, the borderline CT scan, the chronically elevated WBC, and, most importantly, the patient's symptoms of chronic pain, nausea, fever and ED visits, surgical consideration is indicated.  Plan:  Appointment with Dr. Biagio Quint scheduled for consultation.  Signed, Elvina Sidle, MD

## 2012-09-24 NOTE — Patient Instructions (Addendum)
42 yo woman with multiple ED visits over 2 years.  She had gynecologic surgery during which the appendix was noted to be inflamed.  She continues to run fevers and have white blood cell count elevations.  The blood sugars have been totally out of control because of chronic nausea necessitating fluids in the face of dehydration and no appetite.  Married with son a senior in high school.  She cannot have a personal life because of the pain and nausea.  Patient is tired of repeat ED visits, receiving pain medicine but no resolution.  Objective:  Alert, good eye contact Chest:  Clear Heart:  Regular with I/VI systolic ejection type murmur Abdomen:  Soft, some guarding right side, no rebound, tender lower abdomen-particularly right lower quadrant Extrem:  Excoriated lower legs with no edema CT ABDOMEN AND PELVIS WITHOUT CONTRAST  Technique: Multidetector CT imaging of the abdomen and pelvis was  performed following the standard protocol without intravenous  contrast.  Comparison: 07/19/2012, 07/17/2011  Findings: Limited images through the lung bases demonstrate no  significant appreciable abnormality. The heart size is within  normal limits. No pleural or pericardial effusion.  Organ abnormality/lesion detection is limited in the absence of  intravenous contrast. Within this limitation, unremarkable liver.  Cholecystectomy. No biliary ductal dilatation. Unremarkable  spleen, pancreas, adrenal glands.  Symmetric renal size. No hydroureteronephrosis. The left renal  calcification is favored to be vascular though a tiny  nonobstructing stone is not excluded. No ureteral calculi  identified.  Decompressed colon limits evaluation. No overt colitis. The  appendix is upper normal to mildly dilated at 7 mm as seen on  coronal image 47. No periappendiceal inflammation. Small bowel  loops are normal course and caliber. No free intraperitoneal air.  Trace free fluid within the pelvis. No  lymphadenopathy. Mild  stranding within the small bowel root mesentery as seen on series 2  image 43.  There is scattered atherosclerotic calcification of the aorta and  its branches. No aneurysmal dilatation.  Circumferential bladder wall thickening, nonspecific given  incomplete distension. There is nonspecific thickening at the  vaginal cuff with mild surrounding fat stranding. Nodular soft  tissue along the ventral peritoneal surface at the level of the  bladder image 66 series 2 is similar to prior.  No acute osseous finding.  IMPRESSION:  No ureteral calculi or hydroureteronephrosis.  Appendix upper normal to mildly prominent in diameter, similar to  prior. No periappendiceal fat stranding to favor acute  inflammation. Correlate clinically.  Mild fat stranding within the pelvis abuts the vaginal cuff and  posterior bladder wall. Correlate clinically for pelvic  inflammatory/infectious conditions or cystitis.  Unchanged nodular soft tissue along the midline anterior pelvic  wall. This is favored to reflect scar tissue in the setting of  prior C-sections and hysterectomy. Endometrial implant if there is  a history of endometriosis is also a consideration; appears similar  to priors.  Mild mesenteric root fat stranding may be tracking superiorly from  the pelvic process or reflect a nonspecific mesenteritis or  enteritis.  Original Report Authenticated By: Andrew DelGaizo, M.D.  Results for orders placed during the hospital encounter of 09/14/12  CBC WITH DIFFERENTIAL      Result Value Range   WBC 12.8 (*) 4.0 - 10.5 K/uL   RBC 4.61  3.87 - 5.11 MIL/uL   Hemoglobin 14.7  12.0 - 15.0 g/dL   HCT 42.9  36.0 - 46.0 %   MCV 93.1  78.0 - 100.0 fL     MCH 31.9  26.0 - 34.0 pg   MCHC 34.3  30.0 - 36.0 g/dL   RDW 13.0  11.5 - 15.5 %   Platelets 404 (*) 150 - 400 K/uL   Neutrophils Relative % 68  43 - 77 %   Neutro Abs 8.7 (*) 1.7 - 7.7 K/uL   Lymphocytes Relative 25  12 - 46 %    Lymphs Abs 3.2  0.7 - 4.0 K/uL   Monocytes Relative 6  3 - 12 %   Monocytes Absolute 0.8  0.1 - 1.0 K/uL   Eosinophils Relative 1  0 - 5 %   Eosinophils Absolute 0.1  0.0 - 0.7 K/uL   Basophils Relative 0  0 - 1 %   Basophils Absolute 0.0  0.0 - 0.1 K/uL   Assessment:  This really seems to me like a case of "chronic appendicitis", 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  Given Dr. Mody's findings, the borderline CT scan, the chronically elevated WBC, and, most importantly, the patient's symptoms of chronic pain, nausea, fever and ED visits, surgical consideration is indicated.  Plan:  Appointment with Dr. Layton scheduled for consultation.  Signed, Daphane Odekirk, MD  

## 2012-09-25 ENCOUNTER — Emergency Department (HOSPITAL_COMMUNITY)
Admission: EM | Admit: 2012-09-25 | Discharge: 2012-09-25 | Disposition: A | Discharge status: Home / Self Care | Payer: Managed Care, Other (non HMO) | Attending: Emergency Medicine | Admitting: Emergency Medicine

## 2012-09-25 ENCOUNTER — Encounter (HOSPITAL_COMMUNITY): Payer: Self-pay | Admitting: *Deleted

## 2012-09-25 DIAGNOSIS — Z87442 Personal history of urinary calculi: Secondary | ICD-10-CM | POA: Insufficient documentation

## 2012-09-25 DIAGNOSIS — Z8719 Personal history of other diseases of the digestive system: Secondary | ICD-10-CM | POA: Insufficient documentation

## 2012-09-25 DIAGNOSIS — R109 Unspecified abdominal pain: Secondary | ICD-10-CM

## 2012-09-25 DIAGNOSIS — Z8679 Personal history of other diseases of the circulatory system: Secondary | ICD-10-CM | POA: Insufficient documentation

## 2012-09-25 DIAGNOSIS — R1013 Epigastric pain: Secondary | ICD-10-CM | POA: Insufficient documentation

## 2012-09-25 DIAGNOSIS — F411 Generalized anxiety disorder: Secondary | ICD-10-CM | POA: Insufficient documentation

## 2012-09-25 DIAGNOSIS — IMO0001 Reserved for inherently not codable concepts without codable children: Secondary | ICD-10-CM | POA: Insufficient documentation

## 2012-09-25 DIAGNOSIS — Z794 Long term (current) use of insulin: Secondary | ICD-10-CM | POA: Insufficient documentation

## 2012-09-25 DIAGNOSIS — E119 Type 2 diabetes mellitus without complications: Secondary | ICD-10-CM | POA: Insufficient documentation

## 2012-09-25 DIAGNOSIS — Z8739 Personal history of other diseases of the musculoskeletal system and connective tissue: Secondary | ICD-10-CM | POA: Insufficient documentation

## 2012-09-25 DIAGNOSIS — Z8659 Personal history of other mental and behavioral disorders: Secondary | ICD-10-CM | POA: Insufficient documentation

## 2012-09-25 DIAGNOSIS — R1011 Right upper quadrant pain: Secondary | ICD-10-CM | POA: Insufficient documentation

## 2012-09-25 DIAGNOSIS — R1033 Periumbilical pain: Secondary | ICD-10-CM | POA: Insufficient documentation

## 2012-09-25 LAB — COMPREHENSIVE METABOLIC PANEL
ALT: 10 U/L (ref 0–35)
AST: 13 U/L (ref 0–37)
Albumin: 3.6 g/dL (ref 3.5–5.2)
Alkaline Phosphatase: 119 U/L — ABNORMAL HIGH (ref 39–117)
BUN: 11 mg/dL (ref 6–23)
CO2: 25 mEq/L (ref 19–32)
Calcium: 9.4 mg/dL (ref 8.4–10.5)
Chloride: 96 mEq/L (ref 96–112)
Creatinine, Ser: 0.97 mg/dL (ref 0.50–1.10)
GFR calc Af Amer: 84 mL/min — ABNORMAL LOW (ref >90)
GFR calc non Af Amer: 73 mL/min — ABNORMAL LOW (ref >90)
Glucose, Bld: 287 mg/dL — ABNORMAL HIGH (ref 70–99)
Potassium: 3.6 mEq/L (ref 3.5–5.1)
Sodium: 134 mEq/L — ABNORMAL LOW (ref 135–145)
Total Bilirubin: 0.5 mg/dL (ref 0.3–1.2)
Total Protein: 8.1 g/dL (ref 6.0–8.3)

## 2012-09-25 LAB — CBC WITH DIFFERENTIAL/PLATELET
Basophils Absolute: 0 10*3/uL (ref 0.0–0.1)
Basophils Relative: 0 % (ref 0–1)
Eosinophils Absolute: 0.1 10*3/uL (ref 0.0–0.7)
Eosinophils Relative: 1 % (ref 0–5)
HCT: 41.3 % (ref 36.0–46.0)
Hemoglobin: 14.8 g/dL (ref 12.0–15.0)
Lymphocytes Relative: 22 % (ref 12–46)
Lymphs Abs: 2.8 10*3/uL (ref 0.7–4.0)
MCH: 32.7 pg (ref 26.0–34.0)
MCHC: 35.8 g/dL (ref 30.0–36.0)
MCV: 91.2 fL (ref 78.0–100.0)
Monocytes Absolute: 0.9 10*3/uL (ref 0.1–1.0)
Monocytes Relative: 7 % (ref 3–12)
Neutro Abs: 8.7 10*3/uL — ABNORMAL HIGH (ref 1.7–7.7)
Neutrophils Relative %: 70 % (ref 43–77)
Platelets: 429 10*3/uL — ABNORMAL HIGH (ref 150–400)
RBC: 4.53 MIL/uL (ref 3.87–5.11)
RDW: 13.6 % (ref 11.5–15.5)
WBC: 12.6 10*3/uL — ABNORMAL HIGH (ref 4.0–10.5)

## 2012-09-25 LAB — URINALYSIS, ROUTINE W REFLEX MICROSCOPIC
Bilirubin Urine: NEGATIVE
Glucose, UA: >1000 mg/dL — AB
Hgb urine dipstick: NEGATIVE
Ketones, ur: NEGATIVE mg/dL
Leukocytes, UA: NEGATIVE
Nitrite: NEGATIVE
Protein, ur: 30 mg/dL — AB
Specific Gravity, Urine: 1.039 — ABNORMAL HIGH (ref 1.005–1.030)
Urobilinogen, UA: 0.2 mg/dL (ref 0.0–1.0)
pH: 5 (ref 5.0–8.0)

## 2012-09-25 LAB — LIPASE, BLOOD: Lipase: 19 U/L (ref 11–59)

## 2012-09-25 LAB — URINE MICROSCOPIC-ADD ON

## 2012-09-25 MED ORDER — SODIUM CHLORIDE 0.9 % IV SOLN: 1000.0000 mL | INTRAVENOUS | Status: DC

## 2012-09-25 MED ORDER — HYDROCODONE-ACETAMINOPHEN 5-325 MG PO TABS: 2.0000 | ORAL_TABLET | ORAL | Status: DC | PRN

## 2012-09-25 MED ORDER — ONDANSETRON HCL 4 MG/2ML IJ SOLN
4.0000 mg | Freq: Once | INTRAMUSCULAR | Status: DC
  Filled 2012-09-25: qty 2

## 2012-09-25 MED ORDER — HYDROMORPHONE HCL PF 1 MG/ML IJ SOLN
1.0000 mg | Freq: Once | INTRAMUSCULAR | Status: AC
  Administered 2012-09-25: 1 mg via INTRAMUSCULAR
  Filled 2012-09-25: qty 1

## 2012-09-25 MED ORDER — SODIUM CHLORIDE 0.9 % IV SOLN
1000.0000 mL | Freq: Once | INTRAVENOUS | Status: AC
Start: 2012-09-25 — End: 2012-09-25
  Administered 2012-09-25: 1000 mL via INTRAVENOUS

## 2012-09-25 MED ORDER — MORPHINE SULFATE 4 MG/ML IJ SOLN
4.0000 mg | Freq: Once | INTRAMUSCULAR | Status: DC
  Filled 2012-09-25: qty 1

## 2012-09-25 MED ORDER — SODIUM CHLORIDE 0.9 % IV BOLUS (SEPSIS): 500.0000 mL | Freq: Once | INTRAVENOUS | Status: DC

## 2012-09-25 MED ORDER — ONDANSETRON 4 MG PO TBDP
8.0000 mg | ORAL_TABLET | Freq: Once | ORAL | Status: AC
  Administered 2012-09-25: 8 mg via ORAL
  Filled 2012-09-25: qty 2

## 2012-09-25 MED ORDER — SODIUM CHLORIDE 0.9 % IV SOLN: 1000.0000 mL | Freq: Once | INTRAVENOUS | Status: DC

## 2012-09-25 NOTE — ED Notes (Signed)
Morphine wasted 4mg . Witnessed by Eileen Stanford, Charity fundraiser.  Unable to get IV access so Dr. changed the order to Dilaudid 1 mg. IM. Zofran sent back to the pharmacy for credit.

## 2012-09-25 NOTE — ED Provider Notes (Signed)
CSN: [id number]     Arrival date & time 09/25/12  4098 History   First MD Initiated Contact with Patient 09/25/12 [phone]     Chief Complaint  Patient presents with  . Abdominal Pain   (Consider location/radiation/quality/duration/timing/severity/associated sxs/prior Treatment) HPI Comments: Patient presents to the ER for evaluation of abdominal pain. Patient is complaining of pain in the right flank in the upper and midabdomen region. She reports that this pain has been present since February. She is concerned because she was told that she has an inflamed appendix and is concerned that this might be the cause. Patient reports that she is to see her surgeon in 3 days. There has not been any vomiting or diarrhea. She is a diabetic, reports her blood sugars have been slightly elevated recently.  Patient is a 42 y.o. female presenting with abdominal pain.  Abdominal Pain Associated symptoms: no fever     Past Medical History  Diagnosis Date  . Diabetes mellitus   . Fibromyalgia   . Arthritis   . Hyperlipidemia   . Chills   . Abdominal pain   . Nausea & vomiting   . Biliary dyskinesia   . Depression   . Migraine   . Joint pain   . Skin rash     due to medications  . Post traumatic stress disorder (PTSD)   . Sleep trouble   . Major depressive disorder   . Cervical strain   . Closed head injury   . Renal stones    Past Surgical History  Procedure Laterality Date  . Tonsillectomy  2001  . Lipoma removed    . Knee surgery    . Cesarean section  1997, 1999  . Abdominal hysterectomy  2001  . Tumor removal  2000    lower back  . Eye surgery  2006    laser eye surgery left eye  . Cholecystectomy  02/10/2011    Procedure: LAPAROSCOPIC CHOLECYSTECTOMY WITH INTRAOPERATIVE CHOLANGIOGRAM;  Surgeon: Rulon Abide, DO;  Location: [facility] OR;  Service: General;  Laterality: N/A;  . Robotic assisted laparoscopic lysis of adhesion N/A 03/04/2012    Procedure: ROBOTIC ASSISTED LAPAROSCOPIC  LYSIS OF EXTENSIVE ADHESIONS;  Surgeon: Tresa Endo A. Ernestina Penna, MD;  Location: [facility] ORS;  Service: Gynecology;  Laterality: N/A;   Family History  Problem Relation Age of Onset  . Cancer Father     lymphoma  . Nephrolithiasis Father   . Vascular Disease Father   . Cancer Maternal Grandmother     colon  . Hypertension Mother   . Heart disease Mother   . Cataracts Mother   . Diabetes Brother   . Mental illness Maternal Grandfather   . Cancer Maternal Grandfather   . Heart disease Paternal Grandmother   . Heart disease Paternal Grandfather    History  Substance Use Topics  . Smoking status: Never Smoker   . Smokeless tobacco: Never Used  . Alcohol Use: No   OB History   Grav Para Term Preterm Abortions TAB SAB Ect Mult Living                 Review of Systems  Constitutional: Negative for fever.  Gastrointestinal: Positive for abdominal pain.  All other systems reviewed and are negative.    Allergies  Meloxicam; Cephalexin; and Ibuprofen  Home Medications   Current Outpatient Rx  Name  Route  Sig  Dispense  Refill  . acetaminophen (TYLENOL) 500 MG tablet   Oral   Take 1,000  mg by mouth every 6 (six) hours as needed for pain.         Marland Kitchen insulin aspart (NOVOLOG) 100 UNIT/ML injection   Subcutaneous   Inject 8 Units into the skin 3 (three) times daily before meals.         . insulin glargine (LANTUS) 100 UNIT/ML injection   Subcutaneous   Inject 28 Units into the skin at bedtime.           BP 128/74  Pulse 69  Temp(Src) 99.1 F (37.3 C) (Oral)  Resp 18  SpO2 99% Physical Exam  Constitutional: She is oriented to person, place, and time. She appears well-developed and well-nourished. No distress.  HENT:  Head: Normocephalic and atraumatic.  Right Ear: Hearing normal.  Left Ear: Hearing normal.  Nose: Nose normal.  Mouth/Throat: Oropharynx is clear and moist and mucous membranes are normal.  Eyes: Conjunctivae and EOM are normal. Pupils are equal, round,  and reactive to light.  Neck: Normal range of motion. Neck supple.  Cardiovascular: Regular rhythm, S1 normal and S2 normal.  Exam reveals no gallop and no friction rub.   No murmur heard. Pulmonary/Chest: Effort normal and breath sounds normal. No respiratory distress. She exhibits no tenderness.  Abdominal: Soft. Normal appearance and bowel sounds are normal. There is no hepatosplenomegaly. There is tenderness in the right upper quadrant, epigastric area and periumbilical area. There is no rebound, no guarding, no tenderness at McBurney's point and negative Murphy's sign. No hernia.  Musculoskeletal: Normal range of motion.  Neurological: She is alert and oriented to person, place, and time. She has normal strength. No cranial nerve deficit or sensory deficit. Coordination normal. GCS eye subscore is 4. GCS verbal subscore is 5. GCS motor subscore is 6.  Skin: Skin is warm, dry and intact. No rash noted. No cyanosis.  Psychiatric: Her speech is normal and behavior is normal. Thought content normal. Her mood appears anxious. She exhibits a depressed mood.    ED Course  Procedures (including critical care time) Labs Review Labs Reviewed  CBC WITH DIFFERENTIAL - Abnormal; Notable for the following:    WBC 12.6 (*)    Platelets 429 (*)    Neutro Abs 8.7 (*)    All other components within normal limits  COMPREHENSIVE METABOLIC PANEL - Abnormal; Notable for the following:    Sodium 134 (*)    Glucose, Bld 287 (*)    Alkaline Phosphatase 119 (*)    GFR calc non Af Amer 73 (*)    GFR calc Af Amer 84 (*)    All other components within normal limits  URINALYSIS, ROUTINE W REFLEX MICROSCOPIC - Abnormal; Notable for the following:    Specific Gravity, Urine 1.039 (*)    Glucose, UA >1000 (*)    Protein, ur 30 (*)    All other components within normal limits  URINE MICROSCOPIC-ADD ON - Abnormal; Notable for the following:    Squamous Epithelial / LPF FEW (*)    All other components within  normal limits  LIPASE, BLOOD   Imaging Review No results found.  MDM  Diagnosis: Abdominal pain  Patient presents to the ER for evaluation of abdominal pain. Patient has been having persistent pain since February. She is very concerned that she might have "chronic appendicitis". There does not appear to be anything today to suggest this. Patient is nontender in the right lower quadrant at McBurney's point. Pain is more right lateral and upper abdomen. She is status post cholecystectomy  already.  I do not see any reason to repeat imaging today. Patient has had 2 CAT scans recently both of which showed upper limits of normal appendix without periappendiceal inflammation or fluid. I believe that this is a chronic abnormal finding for her, but she is very concerned about the slightly elevated white blood cell count. Her WBC count was at her baseline today.  I discussed with the patient the possibility of treating her pain today and have her followup surgeon Wednesday as scheduled. She was resistant to this, asked me to talk with surgery today. Case was therefore briefly discussed with Doctor Dwain Sarna who agreed with outpatient followup. Patient will be given hydrocodone to be used as needed for pain over the 3 days before her appointment.   Gilda Crease, MD 09/25/12 [phone]

## 2012-09-25 NOTE — ED Notes (Signed)
Called IV team to start IV.

## 2012-09-25 NOTE — ED Notes (Signed)
Pt was ordered Morphine 4 mg. IV unable to get the IV access , Dr. Riley Churches the order to Dilaudid 1 mg. IM. Zofran sent to pharmacy for credit.  Morphine wasted to garbage with Eileen Stanford, Charity fundraiser and [place] , Charity fundraiser.  Instructions given by the pharmacy.

## 2012-09-25 NOTE — ED Notes (Signed)
PT STATES THAT SHE HAS BEEN HAVING THIS RIGHT LATERAL ABDOMINAL PAIN FOR A WHILE AND THEY KNOW IT IS ASSOCIATED WITH INFLAMMED APPENDIX AND SHE IS SCHEDULED WITH A SURGEON.  PT REPORTS FEVER, NAUSEA, ELEVATED WBCS

## 2012-09-25 NOTE — ED Notes (Signed)
Report to [place], [place]

## 2012-09-28 ENCOUNTER — Encounter (INDEPENDENT_AMBULATORY_CARE_PROVIDER_SITE_OTHER): Payer: Self-pay | Admitting: General Surgery

## 2012-09-28 ENCOUNTER — Ambulatory Visit (INDEPENDENT_AMBULATORY_CARE_PROVIDER_SITE_OTHER): Payer: Managed Care, Other (non HMO) | Admitting: General Surgery

## 2012-09-28 VITALS — BP 118/70 | HR 74 | Resp 16 | Ht 64.0 in | Wt 181.2 lb

## 2012-09-28 DIAGNOSIS — R1031 Right lower quadrant pain: Secondary | ICD-10-CM

## 2012-09-28 DIAGNOSIS — G8929 Other chronic pain: Secondary | ICD-10-CM

## 2012-09-28 NOTE — Progress Notes (Signed)
Patient ID: Maureen [name], female   DOB: 10/14/1970, 42 y.o.   MRN: [id number]  Chief Complaint  Patient presents with  . Routine Post Op    reck and discuss sx    HPI Maureen [name] is a 42 y.o. female.  This patient is known to me from prior evaluations for chronic right lower quadrant abdominal pain. She has had a complicated history of an extensive workup of this abdominal pain including upper and lower endoscopies several CT scans and imaging studies as well as diagnostic laparoscopy by her oncologist. The only thing that has really been positive in the workup is a mildly thickened or borderline appendix as well as some intra-abdominal  Adhesions. Since her last visit, she says that she has had some intermittent fevers of low-grade temperatures as well as elevated white blood cell count and her workup has been negative to date. She also has developed some back pain and has seen pain management Dr. And she is now currently on dilaudid. HPI  Past Medical History  Diagnosis Date  . Diabetes mellitus   . Fibromyalgia   . Arthritis   . Hyperlipidemia   . Chills   . Abdominal pain   . Nausea & vomiting   . Biliary dyskinesia   . Depression   . Migraine   . Joint pain   . Skin rash     due to medications  . Post traumatic stress disorder (PTSD)   . Sleep trouble   . Major depressive disorder   . Cervical strain   . Closed head injury   . Renal stones     Past Surgical History  Procedure Laterality Date  . Tonsillectomy  2001  . Lipoma removed    . Knee surgery    . Cesarean section  1997, 1999  . Abdominal hysterectomy  2001  . Tumor removal  2000    lower back  . Eye surgery  2006    laser eye surgery left eye  . Cholecystectomy  02/10/2011    Procedure: LAPAROSCOPIC CHOLECYSTECTOMY WITH INTRAOPERATIVE CHOLANGIOGRAM;  Surgeon: Channing Savich David Kaija Kovacevic, DO;  Location: [facility] OR;  Service: General;  Laterality: N/A;  . Robotic assisted laparoscopic lysis of adhesion N/A  03/04/2012    Procedure: ROBOTIC ASSISTED LAPAROSCOPIC LYSIS OF EXTENSIVE ADHESIONS;  Surgeon: Kelly A. Fogleman, MD;  Location: [facility] ORS;  Service: Gynecology;  Laterality: N/A;    Family History  Problem Relation Age of Onset  . Cancer Father     lymphoma  . Nephrolithiasis Father   . Vascular Disease Father   . Cancer Maternal Grandmother     colon  . Hypertension Mother   . Heart disease Mother   . Cataracts Mother   . Diabetes Brother   . Mental illness Maternal Grandfather   . Cancer Maternal Grandfather   . Heart disease Paternal Grandmother   . Heart disease Paternal Grandfather     Social History History  Substance Use Topics  . Smoking status: Never Smoker   . Smokeless tobacco: Never Used  . Alcohol Use: No    Allergies  Allergen Reactions  . Meloxicam Nausea Only  . Cephalexin Rash  . Ibuprofen Nausea And Vomiting and Rash    Current Outpatient Prescriptions  Medication Sig Dispense Refill  . acetaminophen (TYLENOL) 500 MG tablet Take 1,000 mg by mouth every 6 (six) hours as needed for pain.      . HYDROmorphone (DILAUDID) 2 MG tablet Take 2 mg by mouth every   6 (six) hours as needed for pain.      . insulin aspart (NOVOLOG) 100 UNIT/ML injection Inject 8 Units into the skin 3 (three) times daily before meals.      . insulin glargine (LANTUS) 100 UNIT/ML injection Inject 28 Units into the skin at bedtime.        No current facility-administered medications for this visit.    Review of Systems Review of Systems All other review of systems negative or noncontributory except as stated in the HPI  Blood pressure 118/70, pulse 74, resp. rate 16, height 5' 4" (1.626 m), weight 181 lb 3.2 oz (82.192 kg).  Physical Exam Physical Exam Physical Exam  Nursing note and vitals reviewed. Constitutional: She is oriented to person, place, and time. She appears well-developed and well-nourished. No distress.  HENT:  Head: Normocephalic and atraumatic.  Mouth/Throat:  No oropharyngeal exudate.  Eyes: Conjunctivae and EOM are normal. Pupils are equal, round, and reactive to light. Right eye exhibits no discharge. Left eye exhibits no discharge. No scleral icterus.  Neck: Normal range of motion. Neck supple. No tracheal deviation present.  Cardiovascular: Normal rate, regular rhythm, normal heart sounds and intact distal pulses.   Pulmonary/Chest: Effort normal and breath sounds normal. No stridor. No respiratory distress. She has no wheezes.  Abdominal: Soft. Bowel sounds are normal. She exhibits no distension and no mass. There is no tenderness. There is no rebound and no guarding.  Musculoskeletal: Normal range of motion. She exhibits no edema and no tenderness.  Neurological: She is alert and oriented to person, place, and time.  Skin: Skin is warm and dry. No rash noted. She is not diaphoretic. No erythema. No pallor.  Psychiatric: She has a normal mood and affect. Her behavior is normal. Judgment and thought content normal.    Data Reviewed CT and prior notes  Assessment    Chronic abdominal pain Again we had a long discussion regarding the the workup and the possibility of of appendectomy for potential treatment of her symptoms. I again expressed my concern that this is not from her appendix but at this point, and not sure what else to offer her and she is asking for appendectomy for possible relief of her symptoms. She has had several CT scans which demonstrated borderline abnormal appendix.  She has also been in the emergency room several times for evaluation and she has not had any definitive diagnosis. We again discussed the risks of the surgery including negative exam, bowel injury, abscess or leak, injury to surrounding structures, and persistent symptoms and she expressed understanding and would like to proceed with diagnostic laparoscopy appendectomy and possible lysis of adhesions or possible open surgery    Plan    We will set her up for a  diagnostic laparoscopy when convenient.        Daxton Nydam DAVID 09/28/2012, 2:53 PM    

## 2012-10-04 ENCOUNTER — Encounter (HOSPITAL_COMMUNITY): Payer: Self-pay | Admitting: Pharmacy Technician

## 2012-10-07 ENCOUNTER — Encounter (HOSPITAL_COMMUNITY)
Admission: RE | Admit: 2012-10-07 | Discharge: 2012-10-07 | Disposition: A | Discharge status: Home / Self Care | Payer: Managed Care, Other (non HMO) | Source: Ambulatory Visit | Attending: General Surgery | Admitting: General Surgery

## 2012-10-07 ENCOUNTER — Encounter (HOSPITAL_COMMUNITY): Payer: Self-pay

## 2012-10-07 VITALS — BP 140/84 | HR 83 | Temp 98.4°F | Resp 18 | Ht 64.0 in | Wt 177.6 lb

## 2012-10-07 DIAGNOSIS — G8929 Other chronic pain: Secondary | ICD-10-CM

## 2012-10-07 DIAGNOSIS — Z01812 Encounter for preprocedural laboratory examination: Secondary | ICD-10-CM | POA: Insufficient documentation

## 2012-10-07 DIAGNOSIS — Z01818 Encounter for other preprocedural examination: Secondary | ICD-10-CM | POA: Insufficient documentation

## 2012-10-07 NOTE — Progress Notes (Signed)
ekg 08-23-12 epic Chest 2 view xray 08-23-12 epic Cbc with dif and cmet 09-25-12 epic

## 2012-10-07 NOTE — Patient Instructions (Addendum)
20 TERAH ROBEY  10/07/2012   Your procedure is scheduled on: 10-12-2012  Report to [facility] at 930  AM.  Call this number if you have problems the morning of surgery [phone]   Remember:   Do not eat food or drink liquids :After Midnight.     Take these medicines the morning of surgery with A SIP OF WATER:  None, take 1/2 dose lantus insulin bedtime 10-11-2012                                SEE [facility] PREPARING FOR SURGERY SHEET             You may not have any metal on your body including hair pins and piercings  Do not wear jewelry, make-up.  Do not wear lotions, powders, or perfumes. You may wear deodorant.   Men may shave face and neck.  Do not bring valuables to the hospital. [facility] IS NOT RESPONSIBLE FOR VALUEABLES.  Contacts, dentures or bridgework may not be worn into surgery.  Leave suitcase in the car. After surgery it may be brought to your room.  For patients admitted to the hospital, checkout time is 11:00 AM the day of discharge.   Patients discharged the day of surgery will not be allowed to drive home.  Name and phone number of your driver:  Special Instructions: N/A   Please read over the following fact sheets that you were given:   Call Cain Sieve RN pre op nurse if needed [phone]    FAILURE TO FOLLOW THESE INSTRUCTIONS MAY RESULT IN THE CANCELLATION OF YOUR SURGERY.  PATIENT SIGNATURE___________________________________________  NURSE SIGNATURE_____________________________________________

## 2012-10-12 ENCOUNTER — Encounter (HOSPITAL_COMMUNITY): Payer: Self-pay | Admitting: Anesthesiology

## 2012-10-12 ENCOUNTER — Encounter (HOSPITAL_COMMUNITY): Payer: Self-pay

## 2012-10-12 ENCOUNTER — Ambulatory Visit (HOSPITAL_COMMUNITY): Payer: Managed Care, Other (non HMO) | Admitting: Anesthesiology

## 2012-10-12 ENCOUNTER — Encounter (HOSPITAL_COMMUNITY)
Admission: RE | Disposition: A | Discharge status: Home / Self Care | Payer: Self-pay | Source: Ambulatory Visit | Attending: General Surgery

## 2012-10-12 ENCOUNTER — Ambulatory Visit (HOSPITAL_COMMUNITY)
Admission: RE | Admit: 2012-10-12 | Discharge: 2012-10-13 | Disposition: A | Discharge status: Home / Self Care | Payer: Managed Care, Other (non HMO) | Source: Ambulatory Visit | Attending: General Surgery | Admitting: General Surgery

## 2012-10-12 DIAGNOSIS — K389 Disease of appendix, unspecified: Secondary | ICD-10-CM

## 2012-10-12 DIAGNOSIS — R1031 Right lower quadrant pain: Secondary | ICD-10-CM | POA: Insufficient documentation

## 2012-10-12 DIAGNOSIS — E785 Hyperlipidemia, unspecified: Secondary | ICD-10-CM | POA: Insufficient documentation

## 2012-10-12 DIAGNOSIS — E119 Type 2 diabetes mellitus without complications: Secondary | ICD-10-CM | POA: Insufficient documentation

## 2012-10-12 DIAGNOSIS — G8929 Other chronic pain: Secondary | ICD-10-CM | POA: Insufficient documentation

## 2012-10-12 DIAGNOSIS — IMO0001 Reserved for inherently not codable concepts without codable children: Secondary | ICD-10-CM | POA: Insufficient documentation

## 2012-10-12 DIAGNOSIS — Z794 Long term (current) use of insulin: Secondary | ICD-10-CM | POA: Insufficient documentation

## 2012-10-12 HISTORY — PX: LAPAROSCOPIC APPENDECTOMY: SHX408

## 2012-10-12 LAB — GLUCOSE, CAPILLARY
Glucose-Capillary: 94 mg/dL (ref 70–99)
Glucose-Capillary: 49 mg/dL — ABNORMAL LOW (ref 70–99)
Glucose-Capillary: 100 mg/dL — ABNORMAL HIGH (ref 70–99)
Glucose-Capillary: 159 mg/dL — ABNORMAL HIGH (ref 70–99)
Glucose-Capillary: 200 mg/dL — ABNORMAL HIGH (ref 70–99)
Glucose-Capillary: 216 mg/dL — ABNORMAL HIGH (ref 70–99)
Glucose-Capillary: 234 mg/dL — ABNORMAL HIGH (ref 70–99)

## 2012-10-12 SURGERY — APPENDECTOMY, LAPAROSCOPIC
Anesthesia: General | Site: Abdomen

## 2012-10-12 MED ORDER — ONDANSETRON HCL 4 MG PO TABS: 4.0000 mg | ORAL_TABLET | Freq: Four times a day (QID) | ORAL | Status: DC | PRN

## 2012-10-12 MED ORDER — SUCCINYLCHOLINE CHLORIDE 20 MG/ML IJ SOLN
INTRAMUSCULAR | Status: DC | PRN
  Administered 2012-10-12: 100 mg via INTRAVENOUS

## 2012-10-12 MED ORDER — HYDROMORPHONE HCL PF 1 MG/ML IJ SOLN
INTRAMUSCULAR | Status: AC
  Filled 2012-10-12: qty 1

## 2012-10-12 MED ORDER — INSULIN ASPART 100 UNIT/ML ~~LOC~~ SOLN
0.0000 [IU] | Freq: Three times a day (TID) | SUBCUTANEOUS | Status: DC
  Administered 2012-10-12: 5 [IU] via SUBCUTANEOUS
  Administered 2012-10-13: 08:00:00 via SUBCUTANEOUS

## 2012-10-12 MED ORDER — LIDOCAINE HCL (CARDIAC) 20 MG/ML IV SOLN
INTRAVENOUS | Status: DC | PRN
  Administered 2012-10-12: 60 mg via INTRAVENOUS

## 2012-10-12 MED ORDER — LACTATED RINGERS IV SOLN
INTRAVENOUS | Status: DC | PRN
  Administered 2012-10-12 (×3): via INTRAVENOUS

## 2012-10-12 MED ORDER — LACTATED RINGERS IV SOLN
INTRAVENOUS | Status: DC
  Administered 2012-10-12: 1000 mL via INTRAVENOUS

## 2012-10-12 MED ORDER — PROMETHAZINE HCL 25 MG/ML IJ SOLN
6.2500 mg | INTRAMUSCULAR | Status: DC | PRN
  Administered 2012-10-12: 12.5 mg via INTRAVENOUS

## 2012-10-12 MED ORDER — KETAMINE HCL 50 MG/ML IJ SOLN
INTRAMUSCULAR | Status: DC | PRN
  Administered 2012-10-12 (×5): 10 mg via INTRAMUSCULAR

## 2012-10-12 MED ORDER — ONDANSETRON HCL 4 MG/2ML IJ SOLN: 4.0000 mg | Freq: Four times a day (QID) | INTRAMUSCULAR | Status: DC | PRN

## 2012-10-12 MED ORDER — OXYCODONE HCL 5 MG PO TABS: 5.0000 mg | ORAL_TABLET | ORAL | Status: DC | PRN

## 2012-10-12 MED ORDER — PROPOFOL 10 MG/ML IV BOLUS
INTRAVENOUS | Status: DC | PRN
  Administered 2012-10-12: 150 mg via INTRAVENOUS

## 2012-10-12 MED ORDER — CIPROFLOXACIN IN D5W 400 MG/200ML IV SOLN
INTRAVENOUS | Status: AC
  Filled 2012-10-12: qty 200

## 2012-10-12 MED ORDER — HYDROMORPHONE HCL PF 1 MG/ML IJ SOLN
0.2500 mg | INTRAMUSCULAR | Status: DC | PRN
  Administered 2012-10-12 (×3): 0.5 mg via INTRAVENOUS

## 2012-10-12 MED ORDER — GLYCOPYRROLATE 0.2 MG/ML IJ SOLN
INTRAMUSCULAR | Status: DC | PRN
  Administered 2012-10-12: 0.6 mg via INTRAVENOUS

## 2012-10-12 MED ORDER — POTASSIUM CHLORIDE IN NACL 20-0.9 MEQ/L-% IV SOLN
INTRAVENOUS | Status: DC
  Administered 2012-10-12 – 2012-10-13 (×2): via INTRAVENOUS
  Filled 2012-10-12 (×2): qty 1000

## 2012-10-12 MED ORDER — DEXTROSE 50 % IV SOLN
25.0000 mL | Freq: Once | INTRAVENOUS | Status: AC
  Administered 2012-10-12: 25 mL via INTRAVENOUS

## 2012-10-12 MED ORDER — ENOXAPARIN SODIUM 40 MG/0.4ML ~~LOC~~ SOLN
40.0000 mg | SUBCUTANEOUS | Status: DC
  Administered 2012-10-13: 40 mg via SUBCUTANEOUS
  Filled 2012-10-12 (×2): qty 0.4

## 2012-10-12 MED ORDER — ROCURONIUM BROMIDE 100 MG/10ML IV SOLN
INTRAVENOUS | Status: DC | PRN
  Administered 2012-10-12: 30 mg via INTRAVENOUS

## 2012-10-12 MED ORDER — LACTATED RINGERS IV SOLN: INTRAVENOUS | Status: DC

## 2012-10-12 MED ORDER — HYDROMORPHONE HCL PF 1 MG/ML IJ SOLN: 0.5000 mg | INTRAMUSCULAR | Status: DC | PRN

## 2012-10-12 MED ORDER — DEXTROSE 50 % IV SOLN
INTRAVENOUS | Status: AC
  Filled 2012-10-12: qty 50

## 2012-10-12 MED ORDER — INFLUENZA VAC SPLIT QUAD 0.5 ML IM SUSP
0.5000 mL | INTRAMUSCULAR | Status: AC
  Administered 2012-10-13: 0.5 mL via INTRAMUSCULAR
  Filled 2012-10-12 (×2): qty 0.5

## 2012-10-12 MED ORDER — BUPIVACAINE HCL (PF) 0.25 % IJ SOLN
INTRAMUSCULAR | Status: AC
  Filled 2012-10-12: qty 30

## 2012-10-12 MED ORDER — CIPROFLOXACIN IN D5W 400 MG/200ML IV SOLN
400.0000 mg | Freq: Two times a day (BID) | INTRAVENOUS | Status: DC
  Administered 2012-10-12: 400 mg via INTRAVENOUS

## 2012-10-12 MED ORDER — PROMETHAZINE HCL 25 MG/ML IJ SOLN
INTRAMUSCULAR | Status: AC
  Filled 2012-10-12: qty 1

## 2012-10-12 MED ORDER — OXYCODONE-ACETAMINOPHEN 5-325 MG PO TABS
1.0000 | ORAL_TABLET | ORAL | Status: DC | PRN
  Administered 2012-10-12 – 2012-10-13 (×3): 2 via ORAL
  Filled 2012-10-12 (×3): qty 2

## 2012-10-12 MED ORDER — METRONIDAZOLE IN NACL 5-0.79 MG/ML-% IV SOLN
500.0000 mg | INTRAVENOUS | Status: AC
  Administered 2012-10-12: 500 mg via INTRAVENOUS

## 2012-10-12 MED ORDER — NEOSTIGMINE METHYLSULFATE 1 MG/ML IJ SOLN
INTRAMUSCULAR | Status: DC | PRN
  Administered 2012-10-12: 4 mg via INTRAVENOUS

## 2012-10-12 MED ORDER — ONDANSETRON HCL 4 MG/2ML IJ SOLN
INTRAMUSCULAR | Status: DC | PRN
  Administered 2012-10-12: 4 mg via INTRAVENOUS

## 2012-10-12 MED ORDER — FENTANYL CITRATE 0.05 MG/ML IJ SOLN
INTRAMUSCULAR | Status: DC | PRN
  Administered 2012-10-12: 50 ug via INTRAVENOUS
  Administered 2012-10-12: 100 ug via INTRAVENOUS

## 2012-10-12 MED ORDER — MIDAZOLAM HCL 5 MG/5ML IJ SOLN
INTRAMUSCULAR | Status: DC | PRN
  Administered 2012-10-12 (×2): 1 mg via INTRAVENOUS

## 2012-10-12 MED ORDER — LIDOCAINE-EPINEPHRINE 1 %-1:100000 IJ SOLN
INTRAMUSCULAR | Status: AC
  Filled 2012-10-12: qty 1

## 2012-10-12 MED ORDER — METRONIDAZOLE IN NACL 5-0.79 MG/ML-% IV SOLN
INTRAVENOUS | Status: AC
  Filled 2012-10-12: qty 100

## 2012-10-12 SURGICAL SUPPLY — 31 items
APPLIER CLIP ROT 10 11.4 M/L (STAPLE)
CANISTER SUCTION 2500CC (MISCELLANEOUS) ×2 IMPLANT
CHLORAPREP W/TINT 26ML (MISCELLANEOUS) ×2 IMPLANT
CLIP APPLIE ROT 10 11.4 M/L (STAPLE) IMPLANT
CLOTH BEACON ORANGE TIMEOUT ST (SAFETY) ×2 IMPLANT
COVER SURGICAL LIGHT HANDLE (MISCELLANEOUS) ×2 IMPLANT
DECANTER SPIKE VIAL GLASS SM (MISCELLANEOUS) ×6 IMPLANT
DERMABOND ADVANCED (GAUZE/BANDAGES/DRESSINGS) ×1
DERMABOND ADVANCED .7 DNX12 (GAUZE/BANDAGES/DRESSINGS) ×1 IMPLANT
DRAPE UTILITY XL STRL (DRAPES) ×2 IMPLANT
ELECT REM PT RETURN 9FT ADLT (ELECTROSURGICAL) ×2
ELECTRODE REM PT RTRN 9FT ADLT (ELECTROSURGICAL) ×1 IMPLANT
GLOVE SURG SS PI 7.5 STRL IVOR (GLOVE) ×4 IMPLANT
GOWN PREVENTION PLUS LG XLONG (DISPOSABLE) ×6 IMPLANT
GOWN STRL REIN XL XLG (GOWN DISPOSABLE) ×2 IMPLANT
HANDLE STAPLE EGIA 4 XL (STAPLE) ×2 IMPLANT
KIT BASIN OR (CUSTOM PROCEDURE TRAY) ×2 IMPLANT
NS IRRIG 1000ML POUR BTL (IV SOLUTION) ×2 IMPLANT
POUCH SPECIMEN RETRIEVAL 10MM (ENDOMECHANICALS) ×2 IMPLANT
RELOAD EGIA 45 MED/THCK PURPLE (STAPLE) ×2 IMPLANT
RELOAD EGIA 45 TAN VASC (STAPLE) ×2 IMPLANT
SCALPEL HARMONIC ACE (MISCELLANEOUS) ×2 IMPLANT
SCISSORS LAP 5X35 DISP (ENDOMECHANICALS) IMPLANT
SET IRRIG TUBING LAPAROSCOPIC (IRRIGATION / IRRIGATOR) ×2 IMPLANT
SLEEVE ENDOPATH XCEL 5M (ENDOMECHANICALS) ×2 IMPLANT
SUT MNCRL AB 4-0 PS2 18 (SUTURE) ×2 IMPLANT
TOWEL OR 17X26 10 PK STRL BLUE (TOWEL DISPOSABLE) ×2 IMPLANT
TRAY FOLEY BAG SILVER LF 14FR (CATHETERS) ×2 IMPLANT
TROCAR BALLN 12MMX100 BLUNT (TROCAR) ×2 IMPLANT
TROCAR XCEL NON-BLD 5MMX100MML (ENDOMECHANICALS) ×2 IMPLANT
WATER STERILE IRR 1000ML POUR (IV SOLUTION) IMPLANT

## 2012-10-12 NOTE — Interval H&P Note (Signed)
History and Physical Interval Note:  10/12/2012 1:27 PM  Maureen [name]  has presented today for surgery, with the diagnosis of Abdominal pain  The various methods of treatment have been discussed with the patient and family. After consideration of risks, benefits and other options for treatment, the patient has consented to  Procedure(s): DIAGNOSTIC APPENDECTOMY LAPAROSCOPIC (N/A) as a surgical intervention .  The patient's history has been reviewed, patient examined, no change in status, stable for surgery.  I have reviewed the patient's chart and labs.  Questions were answered to the patient's satisfaction.  I have seen and evaluated her.  Risks of the procedure again discussed including infection, bleeding, injury to surrounding structures, need for open surgery, and persistent symptoms and she expressed understanding and desires to proceed with dx laparoscopy, appendectomy (lap or open)   Ferne Ellingwood DAVID

## 2012-10-12 NOTE — Anesthesia Preprocedure Evaluation (Addendum)
Anesthesia Evaluation  Patient identified by MRN, date of birth, ID band Patient awake    Reviewed: Allergy & Precautions, H&P , NPO status , Patient's Chart, lab work & pertinent test results, reviewed documented beta blocker date and time   Airway Mallampati: II TM Distance: >3 FB Neck ROM: full    Dental no notable dental hx. (+) Teeth Intact and Dental Advisory Given   Pulmonary neg pulmonary ROS,  breath sounds clear to auscultation  Pulmonary exam normal       Cardiovascular negative cardio ROS  Rhythm:regular Rate:Normal     Neuro/Psych  Headaches, PSYCHIATRIC DISORDERS (PTSD) Depression Hx of closed head injury in 2010  Neuromuscular disease    GI/Hepatic negative GI ROS, Neg liver ROS,   Endo/Other  diabetes, Well Controlled, Type 1, Insulin Dependent  Renal/GU Renal disease  negative genitourinary   Musculoskeletal  (+) Fibromyalgia -  Abdominal   Peds negative pediatric ROS (+)  Hematology negative hematology ROS (+)   Anesthesia Other Findings BS 78 this am  Reproductive/Obstetrics                          Anesthesia Physical Anesthesia Plan  ASA: III  Anesthesia Plan: General   Post-op Pain Management:    Induction: Intravenous  Airway Management Planned: Oral ETT  Additional Equipment:   Intra-op Plan:   Post-operative Plan: Extubation in OR  Informed Consent: I have reviewed the patients History and Physical, chart, labs and discussed the procedure including the risks, benefits and alternatives for the proposed anesthesia with the patient or authorized representative who has indicated his/her understanding and acceptance.   Dental advisory given  Plan Discussed with: CRNA  Anesthesia Plan Comments:         Anesthesia Quick Evaluation

## 2012-10-12 NOTE — Brief Op Note (Signed)
10/12/2012  3:10 PM  PATIENT:  Maureen [name]  42 y.o. female  PRE-OPERATIVE DIAGNOSIS:  Abdominal pain  POST-OPERATIVE DIAGNOSIS:  Abdominal pain  PROCEDURE:  Procedure(s): DIAGNOSTIC APPENDECTOMY LAPAROSCOPIC (N/A)  SURGEON:  Surgeon(s) and Role:    * Lodema Pilot, DO - Primary  PHYSICIAN ASSISTANT:   ASSISTANTS: none   ANESTHESIA:   general  EBL:  Total I/O In: 1000 [I.V.:1000] Out: 450 [Urine:450]  BLOOD ADMINISTERED:none  DRAINS: none   LOCAL MEDICATIONS USED:  MARCAINE    and LIDOCAINE   SPECIMEN:  Source of Specimen:  appendix  DISPOSITION OF SPECIMEN:  PATHOLOGY  COUNTS:  YES  TOURNIQUET:  * No tourniquets in log *  DICTATION: .Other Dictation: Dictation Number [id number]  PLAN OF CARE: Admit for overnight observation  PATIENT DISPOSITION:  PACU - hemodynamically stable.   Delay start of Pharmacological VTE agent (>24hrs) due to surgical blood loss or risk of bleeding: no

## 2012-10-12 NOTE — Transfer of Care (Signed)
Immediate Anesthesia Transfer of Care Note  Patient: Maureen [name]  Procedure(s) Performed: Procedure(s) (LRB): DIAGNOSTIC APPENDECTOMY LAPAROSCOPIC (N/A)  Patient Location: PACU  Anesthesia Type: General  Level of Consciousness: sedated, patient cooperative and responds to stimulation  Airway & Oxygen Therapy: Patient Spontanous Breathing and Patient connected to face mask oxgen  Post-op Assessment: Report given to PACU RN and Post -op Vital signs reviewed and stable  Post vital signs: Reviewed and stable  Complications: No apparent anesthesia complications

## 2012-10-12 NOTE — H&P (View-Only) (Signed)
Patient ID: Maureen [name], female   DOB: [birth year], 42 y.o.   MRN: [id number]  Chief Complaint  Patient presents with  . Routine Post Op    reck and discuss sx    HPI Maureen [name] is a 42 y.o. female.  This patient is known to me from prior evaluations for chronic right lower quadrant abdominal pain. She has had a complicated history of an extensive workup of this abdominal pain including upper and lower endoscopies several CT scans and imaging studies as well as diagnostic laparoscopy by her oncologist. The only thing that has really been positive in the workup is a mildly thickened or borderline appendix as well as some intra-abdominal  Adhesions. Since her last visit, she says that she has had some intermittent fevers of low-grade temperatures as well as elevated white blood cell count and her workup has been negative to date. She also has developed some back pain and has seen pain management Dr. And she is now currently on dilaudid. HPI  Past Medical History  Diagnosis Date  . Diabetes mellitus   . Fibromyalgia   . Arthritis   . Hyperlipidemia   . Chills   . Abdominal pain   . Nausea & vomiting   . Biliary dyskinesia   . Depression   . Migraine   . Joint pain   . Skin rash     due to medications  . Post traumatic stress disorder (PTSD)   . Sleep trouble   . Major depressive disorder   . Cervical strain   . Closed head injury   . Renal stones     Past Surgical History  Procedure Laterality Date  . Tonsillectomy  2001  . Lipoma removed    . Knee surgery    . Cesarean section  1997, 1999  . Abdominal hysterectomy  2001  . Tumor removal  2000    lower back  . Eye surgery  2006    laser eye surgery left eye  . Cholecystectomy  02/10/2011    Procedure: LAPAROSCOPIC CHOLECYSTECTOMY WITH INTRAOPERATIVE CHOLANGIOGRAM;  Surgeon: Rulon Abide, DO;  Location: [facility] OR;  Service: General;  Laterality: N/A;  . Robotic assisted laparoscopic lysis of adhesion N/A  03/04/2012    Procedure: ROBOTIC ASSISTED LAPAROSCOPIC LYSIS OF EXTENSIVE ADHESIONS;  Surgeon: Tresa Endo A. Ernestina Penna, MD;  Location: [facility] ORS;  Service: Gynecology;  Laterality: N/A;    Family History  Problem Relation Age of Onset  . Cancer Father     lymphoma  . Nephrolithiasis Father   . Vascular Disease Father   . Cancer Maternal Grandmother     colon  . Hypertension Mother   . Heart disease Mother   . Cataracts Mother   . Diabetes Brother   . Mental illness Maternal Grandfather   . Cancer Maternal Grandfather   . Heart disease Paternal Grandmother   . Heart disease Paternal Grandfather     Social History History  Substance Use Topics  . Smoking status: Never Smoker   . Smokeless tobacco: Never Used  . Alcohol Use: No    Allergies  Allergen Reactions  . Meloxicam Nausea Only  . Cephalexin Rash  . Ibuprofen Nausea And Vomiting and Rash    Current Outpatient Prescriptions  Medication Sig Dispense Refill  . acetaminophen (TYLENOL) 500 MG tablet Take 1,000 mg by mouth every 6 (six) hours as needed for pain.      Marland Kitchen HYDROmorphone (DILAUDID) 2 MG tablet Take 2 mg by mouth every  6 (six) hours as needed for pain.      Marland Kitchen insulin aspart (NOVOLOG) 100 UNIT/ML injection Inject 8 Units into the skin 3 (three) times daily before meals.      . insulin glargine (LANTUS) 100 UNIT/ML injection Inject 28 Units into the skin at bedtime.        No current facility-administered medications for this visit.    Review of Systems Review of Systems All other review of systems negative or noncontributory except as stated in the HPI  Blood pressure 118/70, pulse 74, resp. rate 16, height 5\' 4"  (1.626 m), weight 181 lb 3.2 oz (82.192 kg).  Physical Exam Physical Exam Physical Exam  Nursing note and vitals reviewed. Constitutional: She is oriented to person, place, and time. She appears well-developed and well-nourished. No distress.  HENT:  Head: Normocephalic and atraumatic.  Mouth/Throat:  No oropharyngeal exudate.  Eyes: Conjunctivae and EOM are normal. Pupils are equal, round, and reactive to light. Right eye exhibits no discharge. Left eye exhibits no discharge. No scleral icterus.  Neck: Normal range of motion. Neck supple. No tracheal deviation present.  Cardiovascular: Normal rate, regular rhythm, normal heart sounds and intact distal pulses.   Pulmonary/Chest: Effort normal and breath sounds normal. No stridor. No respiratory distress. She has no wheezes.  Abdominal: Soft. Bowel sounds are normal. She exhibits no distension and no mass. There is no tenderness. There is no rebound and no guarding.  Musculoskeletal: Normal range of motion. She exhibits no edema and no tenderness.  Neurological: She is alert and oriented to person, place, and time.  Skin: Skin is warm and dry. No rash noted. She is not diaphoretic. No erythema. No pallor.  Psychiatric: She has a normal mood and affect. Her behavior is normal. Judgment and thought content normal.    Data Reviewed CT and prior notes  Assessment    Chronic abdominal pain Again we had a long discussion regarding the the workup and the possibility of of appendectomy for potential treatment of her symptoms. I again expressed my concern that this is not from her appendix but at this point, and not sure what else to offer her and she is asking for appendectomy for possible relief of her symptoms. She has had several CT scans which demonstrated borderline abnormal appendix.  She has also been in the emergency room several times for evaluation and she has not had any definitive diagnosis. We again discussed the risks of the surgery including negative exam, bowel injury, abscess or leak, injury to surrounding structures, and persistent symptoms and she expressed understanding and would like to proceed with diagnostic laparoscopy appendectomy and possible lysis of adhesions or possible open surgery    Plan    We will set her up for a  diagnostic laparoscopy when convenient.        Lodema Pilot DAVID 09/28/2012, 2:53 PM

## 2012-10-12 NOTE — Preoperative (Signed)
Beta Blockers   Reason not to administer Beta Blockers:Not Applicable 

## 2012-10-13 ENCOUNTER — Encounter (HOSPITAL_COMMUNITY): Payer: Self-pay | Admitting: General Surgery

## 2012-10-13 LAB — GLUCOSE, CAPILLARY: Glucose-Capillary: 348 mg/dL — ABNORMAL HIGH (ref 70–99)

## 2012-10-13 NOTE — Progress Notes (Signed)
1 Day Post-Op  Subjective: Doing well.  Feels different pain than preop. Tolerating diet  Objective: Vital signs in last 24 hours: Temp:  [98.4 F (36.9 C)-100.3 F (37.9 C)] 98.7 F (37.1 C) (09/25 1610) Pulse Rate:  [64-87] 81 (09/25 0608) Resp:  [8-20] 18 (09/25 0608) BP: (120-159)/(58-84) 133/80 mmHg (09/25 0608) SpO2:  [95 %-100 %] 97 % (09/25 9604) Weight:  [177 lb 9.6 oz (80.559 kg)] 177 lb 9.6 oz (80.559 kg) (09/24 1545) Last BM Date: 10/12/12  Intake/Output from previous day: 09/24 0701 - 09/25 0700 In: 3765 [P.O.:720; I.V.:3045] Out: 2900 [Urine:2900] Intake/Output this shift:    General appearance: alert, cooperative and no distress Resp: clear to auscultation bilaterally Cardio: normal rate, regular GI: soft, appropriate incisional tenderness, ND, wounds okay, no peritoneal signs  Lab Results:  No results found for this basename: WBC, HGB, HCT, PLT,  in the last 72 hours BMET No results found for this basename: NA, K, CL, CO2, GLUCOSE, BUN, CREATININE, CALCIUM,  in the last 72 hours PT/INR No results found for this basename: LABPROT, INR,  in the last 72 hours ABG No results found for this basename: PHART, PCO2, PO2, HCO3,  in the last 72 hours  Studies/Results: No results found.  Anti-infectives: Anti-infectives   Start     Dose/Rate Route Frequency Ordered Stop   10/12/12 1315  ciprofloxacin (CIPRO) IVPB 400 mg  Status:  Discontinued     400 mg 200 mL/hr over 60 Minutes Intravenous Every 12 hours 10/12/12 1313 10/12/12 1642   10/12/12 0942  metroNIDAZOLE (FLAGYL) IVPB 500 mg     500 mg 100 mL/hr over 60 Minutes Intravenous On call to O.R. 10/12/12 0942 10/12/12 1305      Assessment/Plan: s/p Procedure(s): DIAGNOSTIC APPENDECTOMY LAPAROSCOPIC (N/A) she looks good.  should be okay for discharge today  LOS: 1 day    Lodema Pilot DAVID 10/13/2012

## 2012-10-13 NOTE — Anesthesia Postprocedure Evaluation (Signed)
Anesthesia Post Note  Patient: Maureen [name]  Procedure(s) Performed: Procedure(s) (LRB): DIAGNOSTIC APPENDECTOMY LAPAROSCOPIC (N/A)  Anesthesia type: General  Patient location: PACU  Post pain: Pain level controlled  Post assessment: Post-op Vital signs reviewed  Last Vitals:  Filed Vitals:   10/13/12 0220  BP: 120/71  Pulse: 86  Temp: 37.3 C  Resp: 18    Post vital signs: Reviewed  Level of consciousness: sedated  Complications: No apparent anesthesia complications

## 2012-10-13 NOTE — Discharge Summary (Signed)
  Physician Discharge Summary  Patient ID: Maureen [name] MRN: [id number] DOB/AGE: 1970-04-13 42 y.o.  Admit date: 10/12/2012 Discharge date: 10/13/2012  Admission Diagnoses: abdominal pain  Discharge Diagnoses: same Active Problems:   * No active hospital problems. *   Discharged Condition: stable  Hospital Course: to OR 10/12/12 for lap appendectomy.  Admitted for overnight observation.  No events and she was stable for discharge on POD 1  Consults: None  Significant Diagnostic Studies: none  Treatments: surgery: lap appendectomy 10/12/12  Disposition: 01-Home or Self Care  Discharge Orders   Future Orders Complete By Expires   Call MD for:  persistant nausea and vomiting  As directed    Call MD for:  redness, tenderness, or signs of infection (pain, swelling, redness, odor or green/yellow discharge around incision site)  As directed    Call MD for:  severe uncontrolled pain  As directed    Call MD for:  temperature >100.4  As directed    Diet - low sodium heart healthy  As directed    Discharge instructions  As directed    Comments:     May shower tomorrow. Call [phone] to schedule follow up appointment with Dr. Biagio Quint in about 2-3 weeks or sooner as needed.   Increase activity slowly  As directed        Medication List         acetaminophen 500 MG tablet  Commonly known as:  TYLENOL  Take 1,000 mg by mouth every 6 (six) hours as needed for pain.     HYDROmorphone 2 MG tablet  Commonly known as:  DILAUDID  Take 2 mg by mouth every 6 (six) hours as needed for pain.     insulin aspart 100 UNIT/ML injection  Commonly known as:  novoLOG  Inject 8 Units into the skin 3 (three) times daily before meals.     insulin glargine 100 UNIT/ML injection  Commonly known as:  LANTUS  Inject 28 Units into the skin at bedtime.     oxyCODONE 5 MG immediate release tablet  Commonly known as:  ROXICODONE  Take 1-2 tablets (5-10 mg total) by mouth every 4 (four) hours as  needed for pain.         SignedLodema Pilot DAVID 10/13/2012, 7:42 AM

## 2012-10-13 NOTE — Op Note (Signed)
NAMEMarland Kitchen  AUTYM, SIESS NO.:  [id number]  MEDICAL RECORD NO.:  [id number]  LOCATION:  1530                         FACILITY:  [facility]  PHYSICIAN:  Lodema Pilot, MD       DATE OF BIRTH:  May 10, 1970  DATE OF PROCEDURE:  10/12/2012 DATE OF DISCHARGE:                              OPERATIVE REPORT   PREOPERATIVE DIAGNOSIS:  Right lower quadrant abdominal pain.  POSTOPERATIVE DIAGNOSIS:  Right lower quadrant abdominal pain.  PROCEDURE:  Diagnostic laparoscopy with lysis of adhesions, and appendectomy.  SURGEON:  Lodema Pilot, MD  ASSISTANT:  None.  ANESTHESIA:  General endotracheal tube anesthesia with 40 mL of 1% lidocaine with epinephrine and 0.25% Marcaine in a 50:50 mixture.  FLUIDS:  2 L of crystalloid.  ESTIMATED BLOOD LOSS:  Minimal.  DRAINS:  None.  SPECIMENS:  Appendix sent to Pathology for permanent sectioning.  COMPLICATIONS:  None apparent.  FINDINGS:  Multiple abdominal adhesions from the omentum, also the terminal ileum, and sigmoid colon.  No evidence of inguinal or incisional hernias, and normal-appearing appendix which was removed.  INDICATION FOR PROCEDURE:  Ms. Mastandrea is a 42 year old female with chronic right lower quadrant pain and was referred by her gynecologist for an abnormal appearing appendix which was adhesed to the surrounding structures.  She was referred for appendectomy for chronic appendicitis.  OPERATIVE DETAILS:  Ms. Bagheri was seen and evaluated in the preoperative area, and again discussed with her the risks of the procedure mainly the risk of persistent recurrent symptoms as well as the need for open surgery and bowel injury.  She expressed understanding and she desired to proceed with diagnostic laparoscopy and appendectomy. She was given prophylactic antibiotics, taken to the operating room, placed in supine position.  General endotracheal tube anesthesia was obtained.  The left arm was tucked to the side.   Foley catheter was placed.  She was taken across the chest and positioned for appendectomy. Her abdomen was prepped and draped in surgical fashion.  The procedure time-out was performed with all operative team members to confirm proper patient, procedure.  Procedure time-out was performed with all operative team members to confirm patient, procedure, and the supraumbilical midline incision was made in the skin and dissection carried down to the subcutaneous tissue using blunt dissection.  The abdominal wall fascia was elevated and sharply incised and  the peritoneum entered bluntly.  A 12-mm balloon port was placed through this incision and pneumoperitoneum was obtained.  I placed laparoscope and there was a veil of omentum which was adherent to the abdomen basically from the falciform to the umbilicus and down to the left lower quadrant.  I placed a lower midline trocar under direct visualization, and looked back at this area.  There was omentum stuck under the entry site and the transverse colon in the area, but fortunately was not close to the abdominal wall, and there was no apparent injury.  I placed a 5-mm right upper quadrant brought out through 1 of the prior trocar sites and took down the adhesions at the umbilicus and the left lower quadrant.  The appendix was easily visualized, was sitting in the right lower quadrant.  Draping over the sigmoid  and the terminal ileum.  It did not appear to be adhered to any surrounding structures, and I was able to elevate this for further visualization.  I was able to identify the base of the appendix.  The appendix appeared normal, however, was a little bit thickened.  I created a window through the mesoappendix and divided the appendix at its base with Endo-GIA purple Tri-Staple load on the stapler.  The staples appeared well formed and the staple line was hemostatic.  I made sure that I could elevate the appendix and the mesoappendix was  then divided with tan and Tri-Staple firing on the Endo GIA, taking care to avoid injury to the terminal ileum, and retroperitoneal structures.  The staple line was also hemostatic and the appendix was placed in EndoCatch bag and removed from the umbilical trocar site and sent to Pathology for permanent sectioning.  The terminal ileum was adhered to the iliac vessels with no evidence of any bowel obstruction.  She had some adhesions under hysterectomy scar with adhesions of the sigmoid colon, but there did not appear to be any hernia either under the incision or in either inguinal region.  I look around for any other obvious source, and I did not identify any other cause for her abdominal pain.  I inspected the bowel and there was no evidence of bowel injury.  The omentum which was taken down from the abdominal wall was inspected and noted to be hemostatic.  I then approximated the fascia at the umbilicus with interrupted 0 Vicryl sutures in open fashion.  The sutures were secured and the abdomen was re-insufflated and the abdominal wall closure was noted be adequate without any evidence of bleeding or bowel injury.  The other trocars were removed and the wounds were injected with a total of 40 mL of 1% lidocaine with epinephrine, and 0.25% Marcaine in 50:50 mixture.  Skin edges were approximated with 4-0 Monocryl subcuticular suture.  Skin was washed and dried, and Dermabond was applied.  All sponge, needle, and instrument counts were correct at the end of the case.  The patient tolerated the procedure well without apparent complications.          ______________________________ Lodema Pilot, MD     BL/MEDQ  D:  10/12/2012  T:  10/13/2012  Job:  [id number]

## 2012-10-14 IMAGING — US US ABDOMEN COMPLETE
1 series · 14 of 25 positions shown · non-contrast
Comparison: Abdominal ultrasound - 01/20/2009

CLINICAL DATA: Evaluate for gallstones

COMPLETE ABDOMINAL ULTRASOUND

[Series 1: us abdomen complete · 0.30mm/px · 14 of 44 slices shown]
[im 1/44]
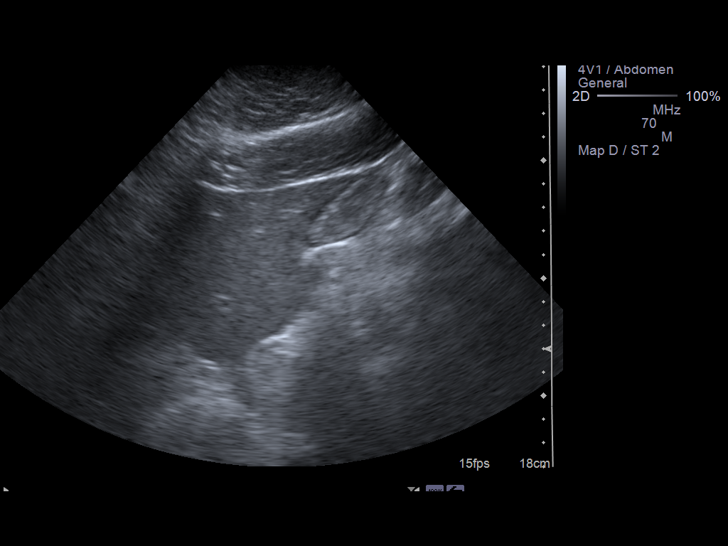
[im 4/44]
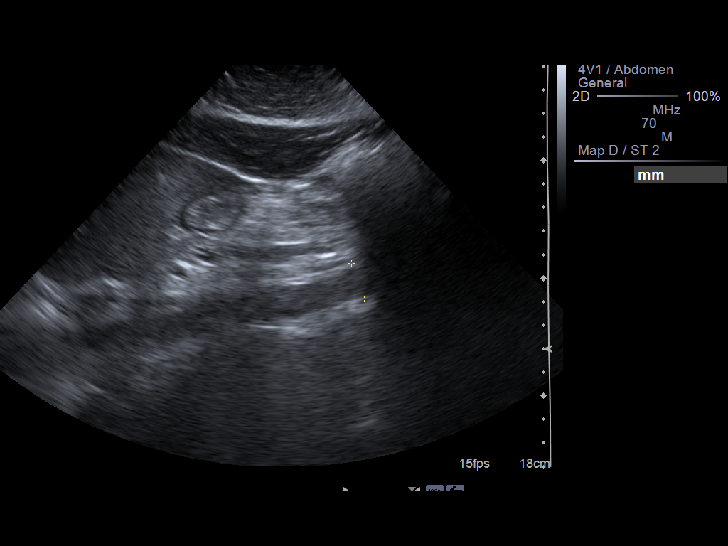
[im 8/44]
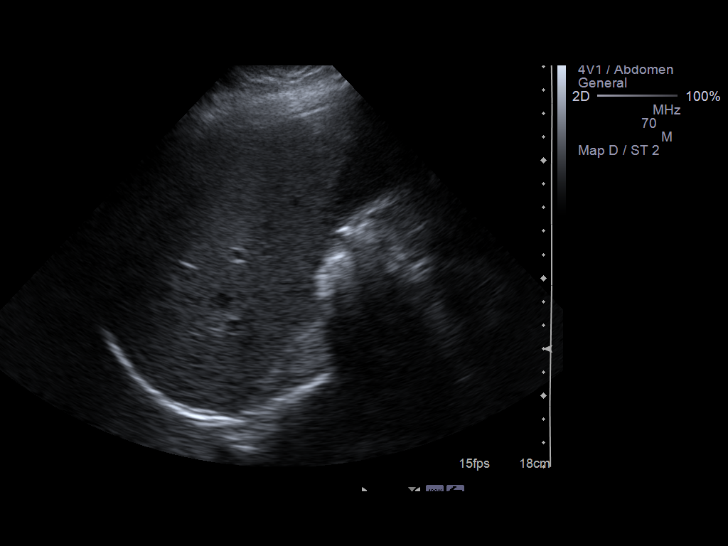
[im 11/44]
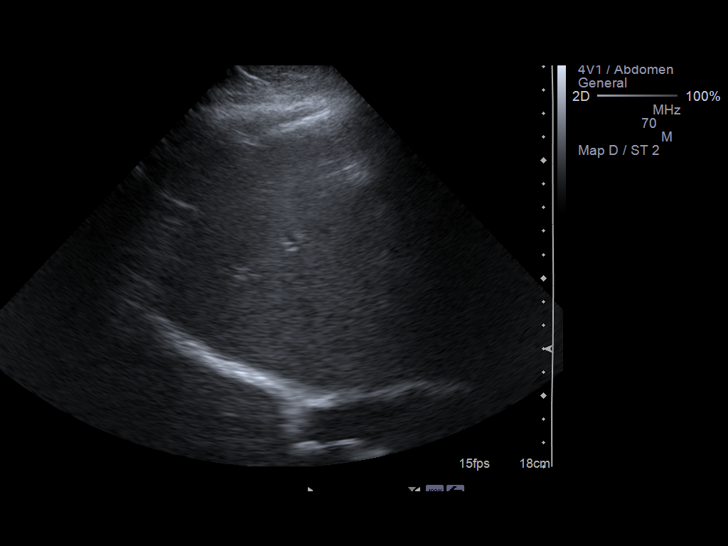
[im 15/44]
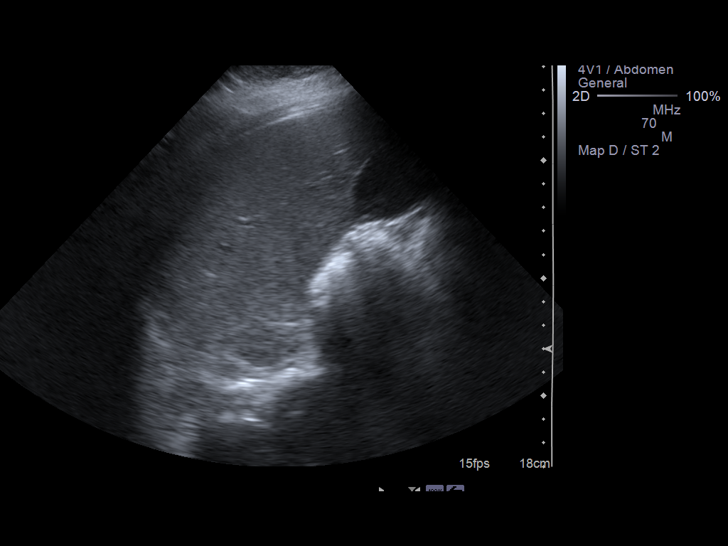
[im 17/44]
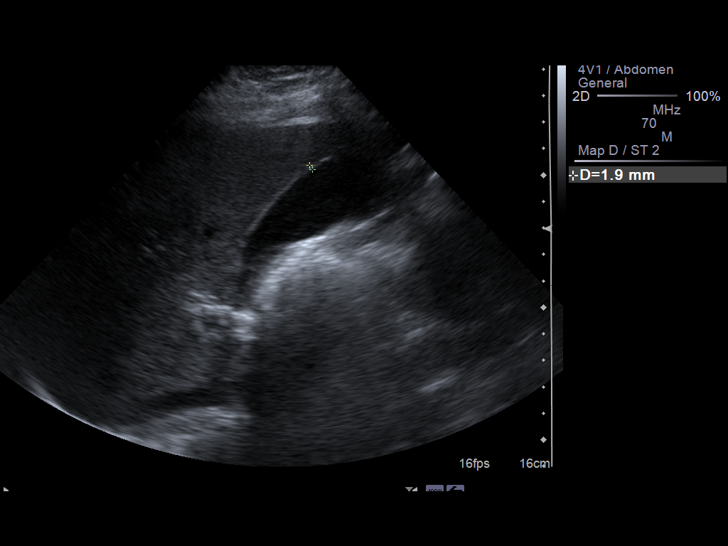
[im 20/44]
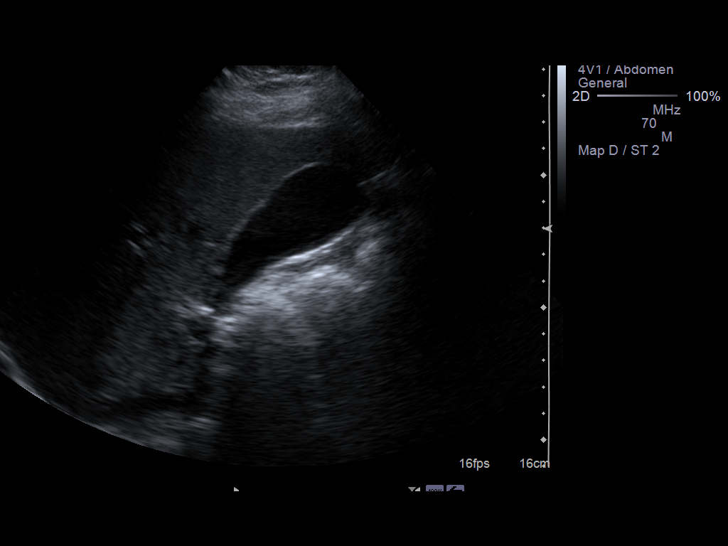
[im 24/44]
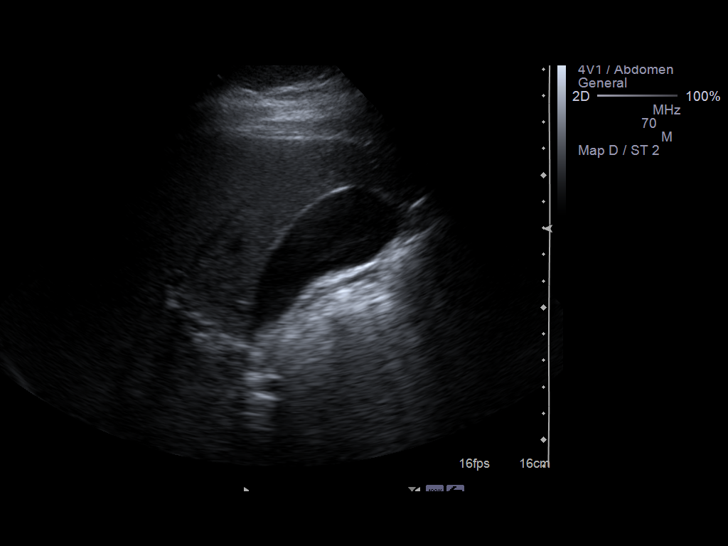
[im 27/44]
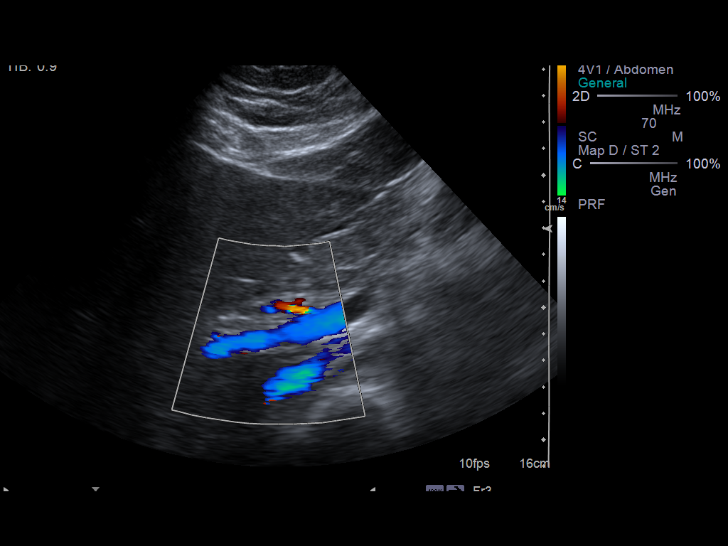
[im 29/44]
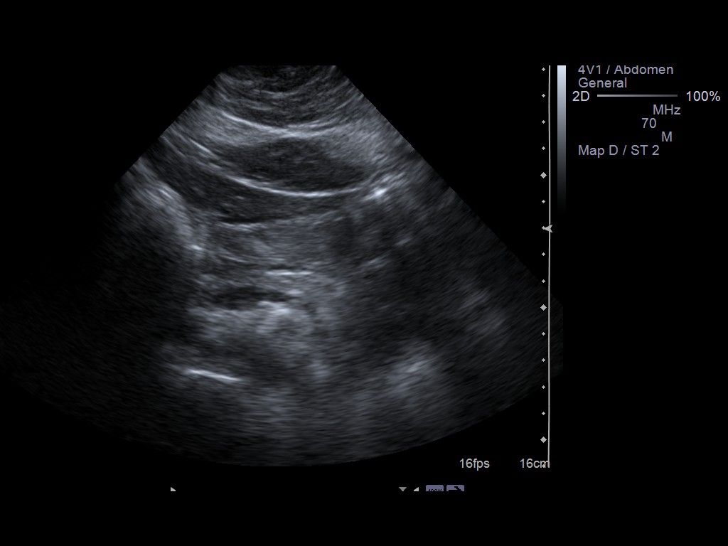
[im 33/44]
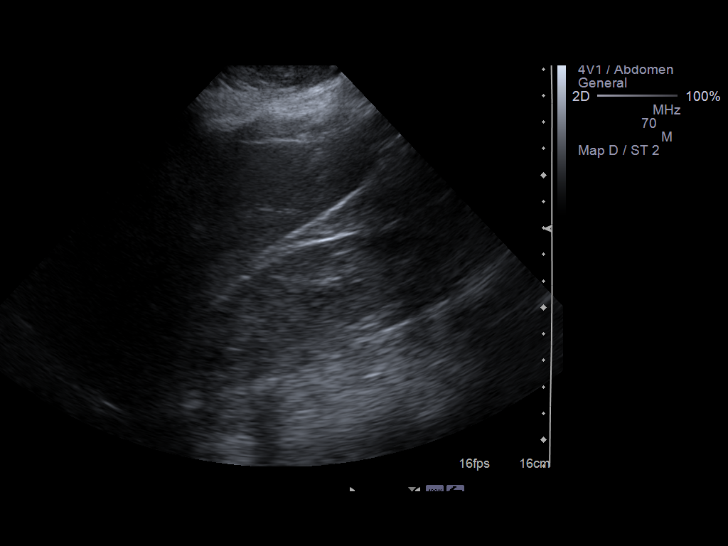
[im 36/44]
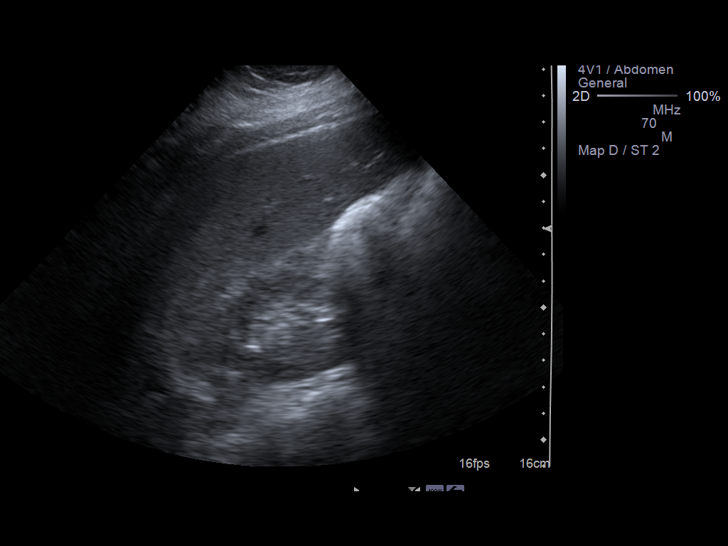
[im 40/44]
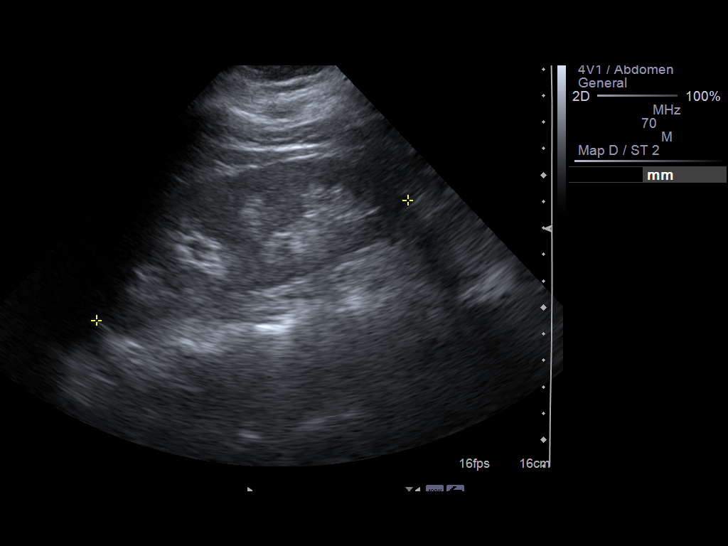
[im 44/44]
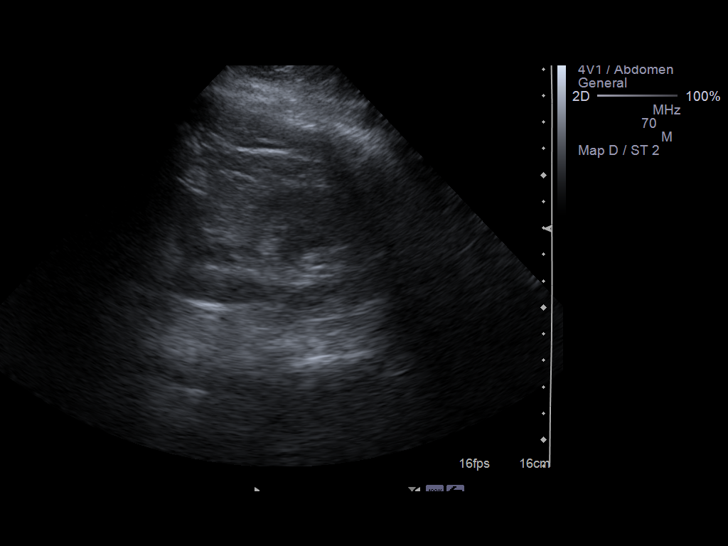

[14 of 25 positions shown; findings below may reference images not displayed]

FINDINGS: Gallbladder:  No gallstones, gallbladder wall thickening, or
pericholecystic fluid.  Negative sonographic Murphy's sign

Common bile duct:  Normal in size, measuring 2.8 mm in diameter

Liver:  Homogeneous hepatic echotexture.  No discrete hyper
hypoechoic hepatic lesions.  No ascites.

IVC:  Appears normal.

Pancreas:  Largely obscured by overlying bowel gas.

Spleen:  Normal in size, measuring 8.7 cm in length.

Right Kidney:  Normal cortical thickness, echogenicity and size,
measuring 11.2 cm in length.  No focal renal lesions.  No echogenic
renal stones.  No urinary obstruction.

Left Kidney:  Normal cortical thickness, echogenicity and size,
measuring 12.6 cm in length.  No focal renal lesions.  No echogenic
renal stones.  No urinary obstruction.

Abdominal aorta:  No aneurysm identified.
IMPRESSION: No explanation for patient's abdominal pain.  Specifically, no
evidence of cholelithiasis.

## 2012-10-19 ENCOUNTER — Telehealth (INDEPENDENT_AMBULATORY_CARE_PROVIDER_SITE_OTHER): Payer: Self-pay

## 2012-10-19 DIAGNOSIS — G8918 Other acute postprocedural pain: Secondary | ICD-10-CM

## 2012-10-19 MED ORDER — HYDROCODONE-ACETAMINOPHEN 5-325 MG PO TABS: 1.0000 | ORAL_TABLET | Freq: Four times a day (QID) | ORAL | Status: DC | PRN

## 2012-10-19 NOTE — Telephone Encounter (Signed)
RX for Norco 5/325 #30 written by Dr Sheron Nightingale at front desk. Pt advised will require signature.

## 2012-10-19 NOTE — Addendum Note (Signed)
Addended by: Joanette Gula on: 10/19/2012 02:44 PM   Modules accepted: Orders

## 2012-10-19 NOTE — Telephone Encounter (Signed)
Patient called in requesting refill of pain medicine. She is 1 week out from surgery and no refill has been given. Advised patient norco can be prescribed and we will call her back once a doctor has sign prescription. We will call when ready for pick up.

## 2012-10-25 IMAGING — CR DG CHEST 2V
2 series · 2 of 2 positions shown · non-contrast
Comparison: 11/17/2009

CLINICAL DATA: Diabetes, elevated ACE levels, question sarcoidosis

CHEST - 2 VIEW

[view not recorded (1 of 2)]
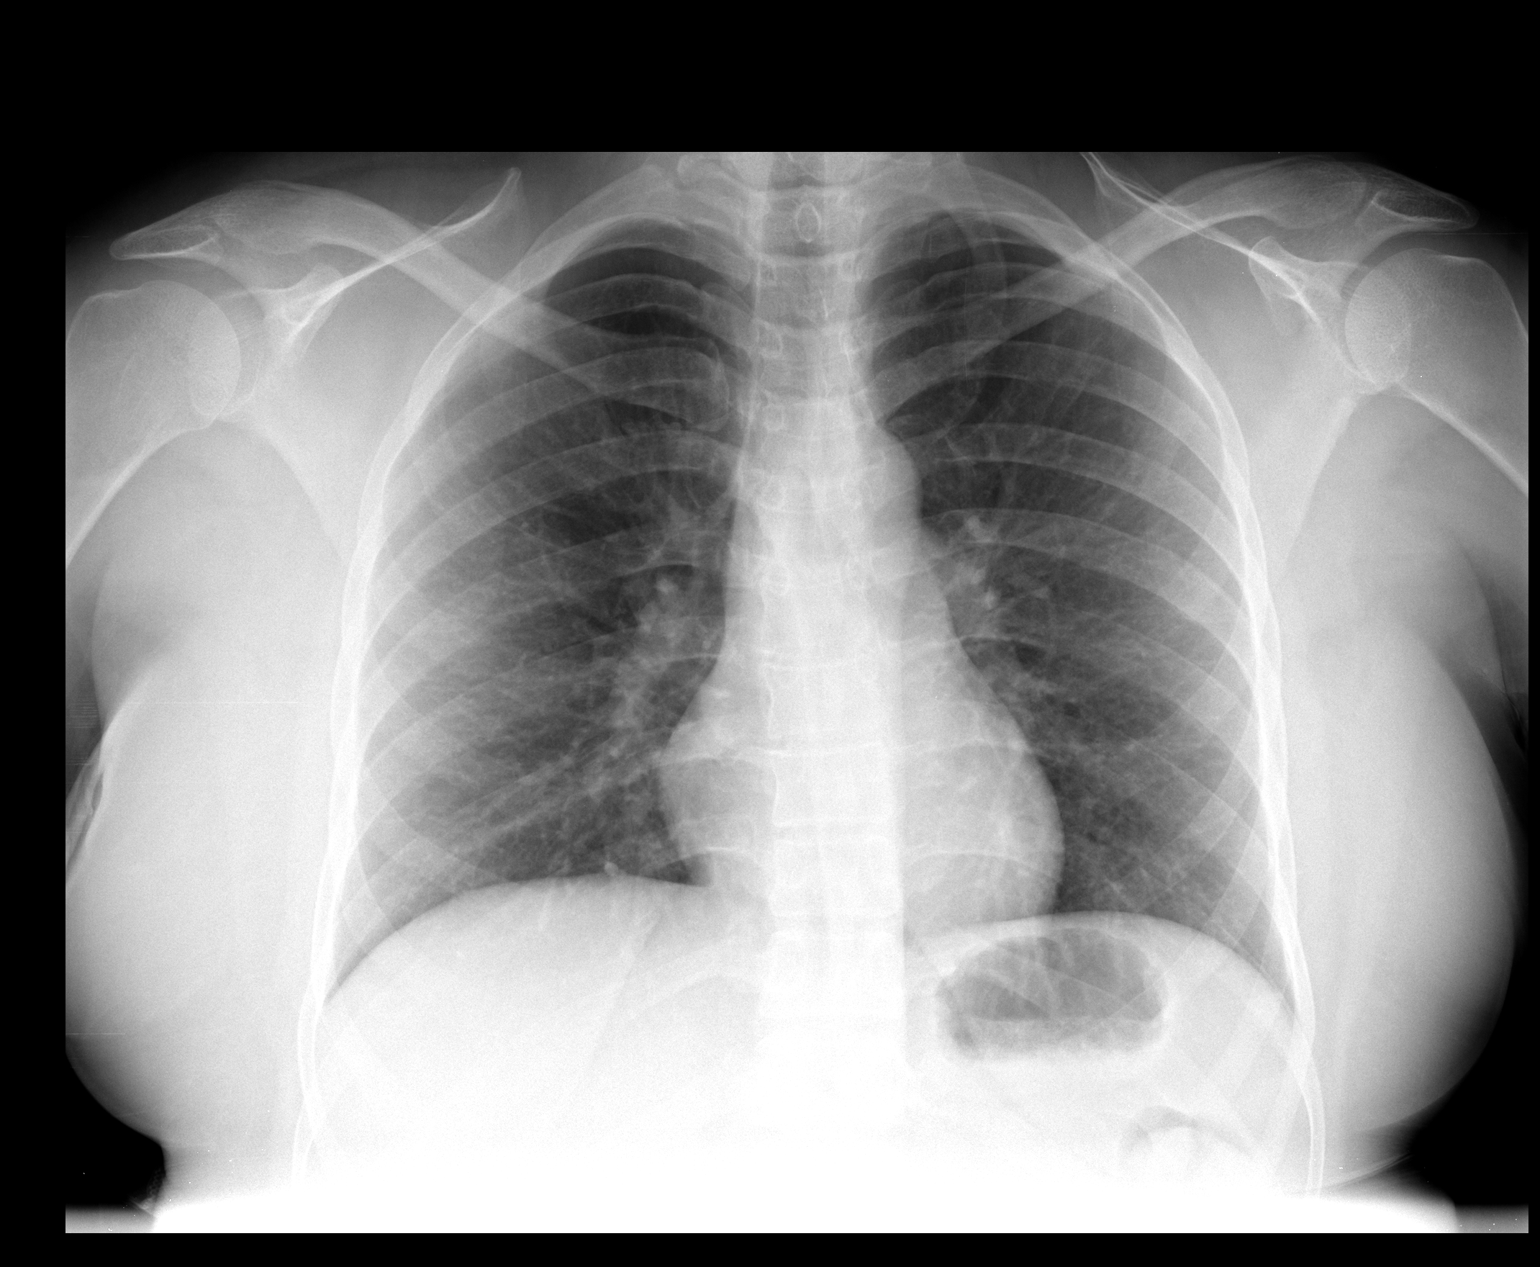

[view not recorded (2 of 2)]
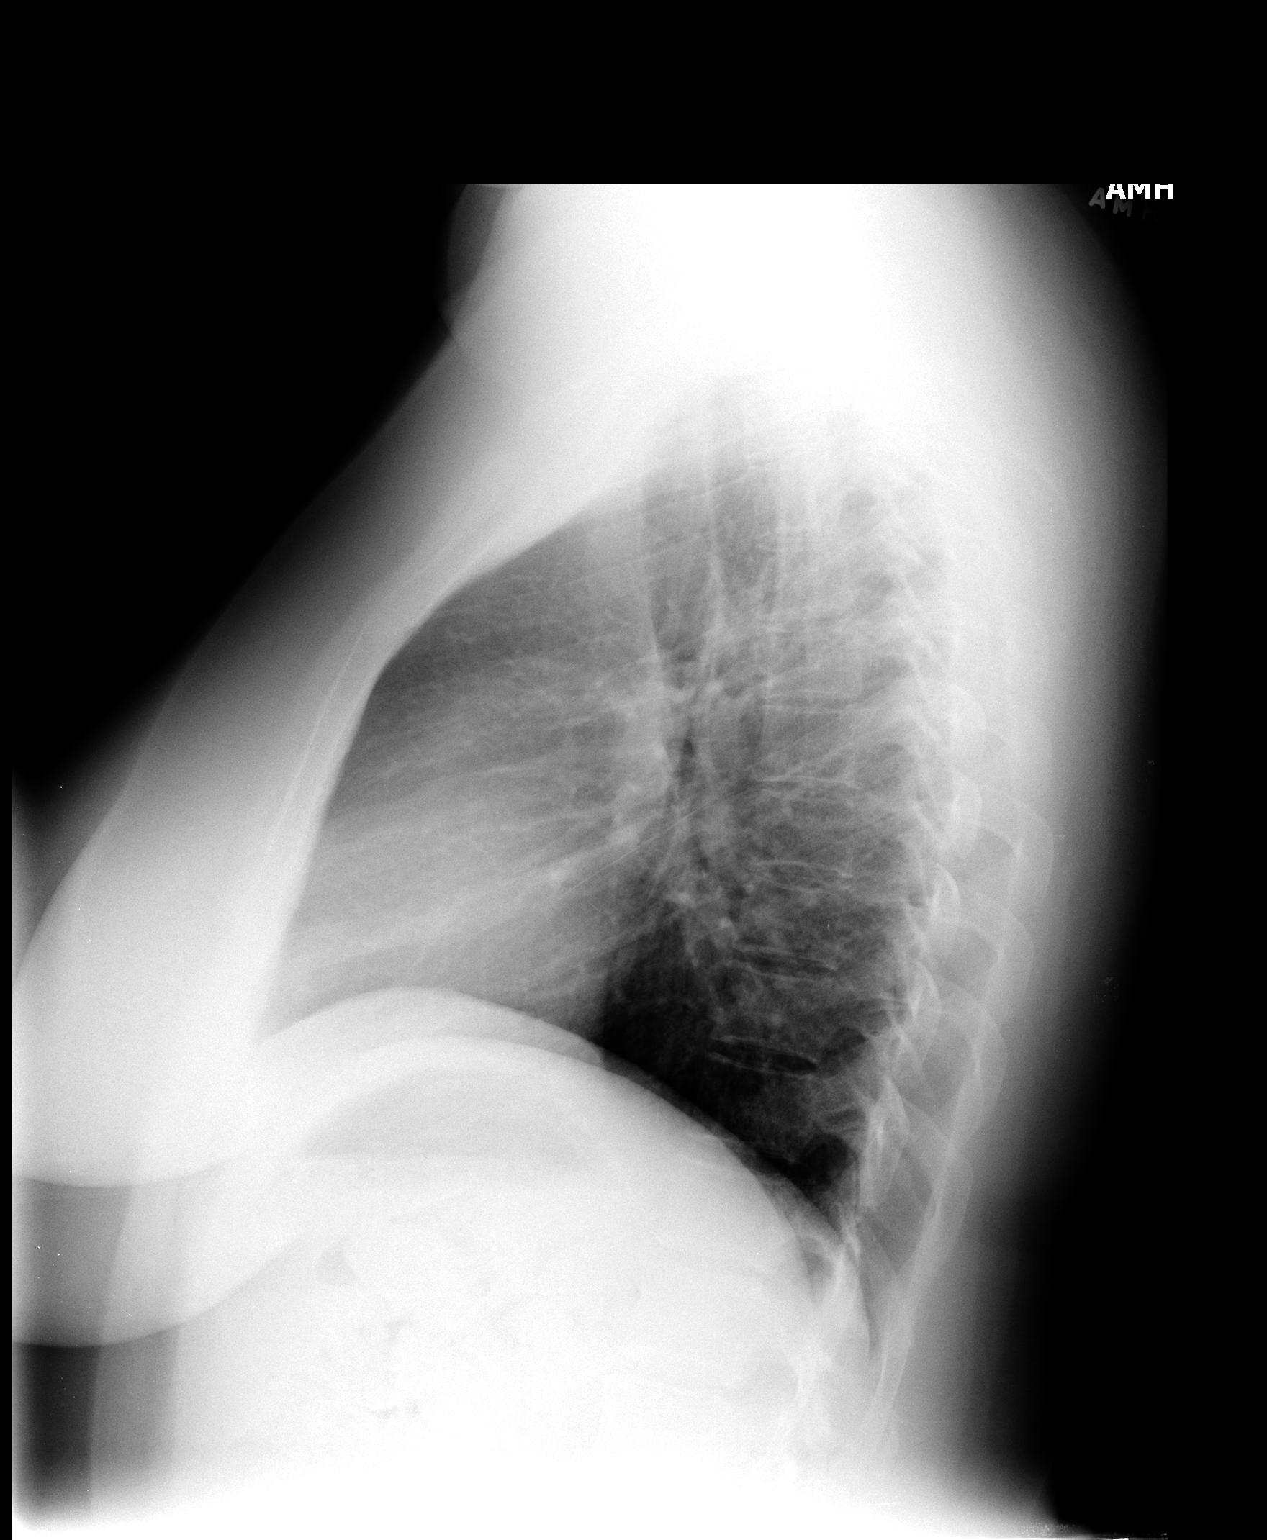

[2 of 2 positions shown; findings below may reference images not displayed]

FINDINGS: Normal heart size, mediastinal contours, and pulmonary vascularity.
Lungs clear.
No pleural effusion or pneumothorax.
No evidence of hilar or mediastinal adenopathy.
Left cervical rib.
Bones otherwise unremarkable.
IMPRESSION: No acute abnormalities.

## 2012-10-28 ENCOUNTER — Encounter (INDEPENDENT_AMBULATORY_CARE_PROVIDER_SITE_OTHER): Payer: Self-pay | Admitting: General Surgery

## 2012-10-28 ENCOUNTER — Ambulatory Visit (INDEPENDENT_AMBULATORY_CARE_PROVIDER_SITE_OTHER): Payer: Managed Care, Other (non HMO) | Admitting: General Surgery

## 2012-10-28 VITALS — BP 124/70 | HR 80 | Temp 97.0°F | Resp 15 | Ht 64.0 in | Wt 178.4 lb

## 2012-10-28 DIAGNOSIS — Z4889 Encounter for other specified surgical aftercare: Secondary | ICD-10-CM

## 2012-10-28 DIAGNOSIS — Z5189 Encounter for other specified aftercare: Secondary | ICD-10-CM

## 2012-10-28 NOTE — Progress Notes (Signed)
Subjective:     Patient ID: Maureen [name], female   DOB: [birth year], 42 y.o.   MRN: [id number]  HPI This patient follows up status post microscopic appendectomy for suspected chronic appendicitis and chronic right lower quadrant abdominal pain. She says that she feels much better and her pain is "different pain". Her pathology is benign. She is tolerating regular diet and her bowels are functioning normally and she has no complaints. She is very satisfied with the outcome.  Review of Systems     Objective:   Physical Exam No distress and nontoxic-appearing Her abdomen is soft and nontender on exam her incisions are healing nicely without any sign of infection    Assessment:     Status post microscopic appendectomy-improving She is doing well from her procedure and feels better than preoperatively. She still has some abdominal discomfort but she says that her pain feels "different" than before her surgery and she continues to improve. I have no prescriptions for her and she can gradually increase diet activity as tolerated. I explained that if her pain returns then we will at least know that it is not from her appendix and she can pursue other sources of her pain. However, she is satisfied with outcome and says that she feels better.    Plan:     Increase diet and activity as tolerated and follow up with surgery PRN

## 2012-10-31 ENCOUNTER — Telehealth (INDEPENDENT_AMBULATORY_CARE_PROVIDER_SITE_OTHER): Payer: Self-pay | Admitting: General Surgery

## 2012-10-31 NOTE — Telephone Encounter (Signed)
Patient called and wanted to have a written Rx for Norco 5/325. Patient is aware that Dr Biagio Quint is not in the office and she will have to have a written Rx . Patient call back number is [phone]

## 2012-10-31 NOTE — Telephone Encounter (Signed)
Called and spoke to patient to make her aware that the Urgent Office physician has not had a chance to review her pain medication request.  Patient advised that I may have to wait and page Dr. Biagio Quint in the morning in the event it's not addressed today.  Patient verbalized understanding and okay with plan as above.

## 2012-11-02 ENCOUNTER — Telehealth (INDEPENDENT_AMBULATORY_CARE_PROVIDER_SITE_OTHER): Payer: Self-pay

## 2012-11-02 ENCOUNTER — Telehealth: Payer: Self-pay

## 2012-11-02 DIAGNOSIS — R2230 Localized swelling, mass and lump, unspecified upper limb: Secondary | ICD-10-CM

## 2012-11-02 NOTE — Telephone Encounter (Signed)
-  Patient said when she was in last we gave her a phamplet to call and her mammogram done, since there was a lump under her arm she has left a message saying that they need a referral (breast center) for a diagnostic please call patient with questions [phone]

## 2012-11-02 NOTE — Telephone Encounter (Addendum)
Patient states she thought her norco rx would be at front desk for pick up. I advised her I did not see Rx for pick up I would have C. Moore CMA call her.

## 2012-11-02 NOTE — Telephone Encounter (Signed)
Pended order for diagnostic mammogram, but see no mention of this in your office visit, do you recall?

## 2012-11-02 NOTE — Telephone Encounter (Signed)
Paged and reviewed request with Dr. Biagio Quint.  Pain medication refill has been denied per Dr. Biagio Quint.  Patient may need to be seen by Pain Management Specialist for further treatment and management.  Called and made patient aware.  Patient states we Referred her to Pain Management back in February.  She will call and make appointment with the office.

## 2012-11-03 ENCOUNTER — Other Ambulatory Visit: Payer: Self-pay | Admitting: Family Medicine

## 2012-11-03 DIAGNOSIS — R2230 Localized swelling, mass and lump, unspecified upper limb: Secondary | ICD-10-CM

## 2012-11-15 ENCOUNTER — Other Ambulatory Visit: Payer: Managed Care, Other (non HMO)

## 2012-11-18 ENCOUNTER — Encounter (HOSPITAL_COMMUNITY): Payer: Self-pay | Admitting: Emergency Medicine

## 2012-11-18 ENCOUNTER — Other Ambulatory Visit: Payer: Self-pay

## 2012-11-18 ENCOUNTER — Emergency Department (HOSPITAL_COMMUNITY)
Admission: EM | Admit: 2012-11-18 | Discharge: 2012-11-18 | Disposition: A | Discharge status: Home / Self Care | Payer: Managed Care, Other (non HMO) | Attending: Emergency Medicine | Admitting: Emergency Medicine

## 2012-11-18 DIAGNOSIS — Z794 Long term (current) use of insulin: Secondary | ICD-10-CM | POA: Insufficient documentation

## 2012-11-18 DIAGNOSIS — IMO0001 Reserved for inherently not codable concepts without codable children: Secondary | ICD-10-CM | POA: Insufficient documentation

## 2012-11-18 DIAGNOSIS — M129 Arthropathy, unspecified: Secondary | ICD-10-CM | POA: Insufficient documentation

## 2012-11-18 DIAGNOSIS — R42 Dizziness and giddiness: Secondary | ICD-10-CM | POA: Insufficient documentation

## 2012-11-18 DIAGNOSIS — Z79899 Other long term (current) drug therapy: Secondary | ICD-10-CM | POA: Insufficient documentation

## 2012-11-18 DIAGNOSIS — F4310 Post-traumatic stress disorder, unspecified: Secondary | ICD-10-CM | POA: Insufficient documentation

## 2012-11-18 DIAGNOSIS — Z8673 Personal history of transient ischemic attack (TIA), and cerebral infarction without residual deficits: Secondary | ICD-10-CM | POA: Insufficient documentation

## 2012-11-18 DIAGNOSIS — F3289 Other specified depressive episodes: Secondary | ICD-10-CM | POA: Insufficient documentation

## 2012-11-18 DIAGNOSIS — F329 Major depressive disorder, single episode, unspecified: Secondary | ICD-10-CM | POA: Insufficient documentation

## 2012-11-18 DIAGNOSIS — Z7982 Long term (current) use of aspirin: Secondary | ICD-10-CM | POA: Insufficient documentation

## 2012-11-18 DIAGNOSIS — G43909 Migraine, unspecified, not intractable, without status migrainosus: Secondary | ICD-10-CM | POA: Insufficient documentation

## 2012-11-18 DIAGNOSIS — E119 Type 2 diabetes mellitus without complications: Secondary | ICD-10-CM | POA: Insufficient documentation

## 2012-11-18 DIAGNOSIS — R209 Unspecified disturbances of skin sensation: Secondary | ICD-10-CM | POA: Insufficient documentation

## 2012-11-18 DIAGNOSIS — H538 Other visual disturbances: Secondary | ICD-10-CM | POA: Insufficient documentation

## 2012-11-18 DIAGNOSIS — E785 Hyperlipidemia, unspecified: Secondary | ICD-10-CM | POA: Insufficient documentation

## 2012-11-18 LAB — CBC
HCT: 42.5 % (ref 36.0–46.0)
Hemoglobin: 15.1 g/dL — ABNORMAL HIGH (ref 12.0–15.0)
MCH: 33.4 pg (ref 26.0–34.0)
MCHC: 35.5 g/dL (ref 30.0–36.0)
MCV: 94 fL (ref 78.0–100.0)
Platelets: 389 10*3/uL (ref 150–400)
RBC: 4.52 MIL/uL (ref 3.87–5.11)
RDW: 13 % (ref 11.5–15.5)
WBC: 14.5 10*3/uL — ABNORMAL HIGH (ref 4.0–10.5)

## 2012-11-18 LAB — BASIC METABOLIC PANEL
BUN: 14 mg/dL (ref 6–23)
CO2: 27 mEq/L (ref 19–32)
Calcium: 9.8 mg/dL (ref 8.4–10.5)
Chloride: 100 mEq/L (ref 96–112)
Creatinine, Ser: 0.96 mg/dL (ref 0.50–1.10)
GFR calc Af Amer: 85 mL/min — ABNORMAL LOW (ref >90)
GFR calc non Af Amer: 73 mL/min — ABNORMAL LOW (ref >90)
Glucose, Bld: 105 mg/dL — ABNORMAL HIGH (ref 70–99)
Potassium: 4.4 mEq/L (ref 3.5–5.1)
Sodium: 140 mEq/L (ref 135–145)

## 2012-11-18 LAB — POCT I-STAT TROPONIN I: Troponin i, poc: 0 ng/mL (ref 0.00–0.08)

## 2012-11-18 LAB — GLUCOSE, CAPILLARY: Glucose-Capillary: 83 mg/dL (ref 70–99)

## 2012-11-18 MED ORDER — SODIUM CHLORIDE 0.9 % IV BOLUS (SEPSIS)
1000.0000 mL | Freq: Once | INTRAVENOUS | Status: AC
  Administered 2012-11-18: 1000 mL via INTRAVENOUS

## 2012-11-18 MED ORDER — HYDROMORPHONE HCL PF 1 MG/ML IJ SOLN
1.0000 mg | Freq: Once | INTRAMUSCULAR | Status: AC
  Administered 2012-11-18: 1 mg via INTRAVENOUS
  Filled 2012-11-18: qty 1

## 2012-11-18 MED ORDER — BUTALBITAL-APAP-CAFFEINE 50-325-40 MG PO TABS: 1.0000 | ORAL_TABLET | Freq: Three times a day (TID) | ORAL | Status: DC | PRN

## 2012-11-18 MED ORDER — DEXAMETHASONE SODIUM PHOSPHATE 4 MG/ML IJ SOLN
4.0000 mg | Freq: Once | INTRAMUSCULAR | Status: AC
  Administered 2012-11-18: 4 mg via INTRAVENOUS
  Filled 2012-11-18: qty 1

## 2012-11-18 MED ORDER — DIPHENHYDRAMINE HCL 50 MG/ML IJ SOLN
25.0000 mg | Freq: Once | INTRAMUSCULAR | Status: AC
  Administered 2012-11-18: 25 mg via INTRAVENOUS
  Filled 2012-11-18: qty 1

## 2012-11-18 MED ORDER — HALOPERIDOL LACTATE 5 MG/ML IJ SOLN
2.5000 mg | Freq: Once | INTRAMUSCULAR | Status: AC
  Administered 2012-11-18: 2.5 mg via INTRAVENOUS
  Filled 2012-11-18: qty 1

## 2012-11-18 MED ORDER — ONDANSETRON HCL 4 MG/2ML IJ SOLN
4.0000 mg | Freq: Once | INTRAMUSCULAR | Status: AC
  Administered 2012-11-18: 4 mg via INTRAVENOUS
  Filled 2012-11-18: qty 2

## 2012-11-18 MED ORDER — DIVALPROEX SODIUM 250 MG PO DR TAB
500.0000 mg | DELAYED_RELEASE_TABLET | Freq: Once | ORAL | Status: AC
  Administered 2012-11-18: 500 mg via ORAL
  Filled 2012-11-18: qty 2

## 2012-11-18 NOTE — ED Notes (Signed)
Attempted SL with no success.  IV team at bedside.

## 2012-11-18 NOTE — ED Notes (Signed)
Pt c/o dizziness and HA x 2 days; pt sts fluctuating CBG also

## 2012-11-18 NOTE — ED Provider Notes (Signed)
CSN: [id number]     Arrival date & time 11/18/12  1404 History   First MD Initiated Contact with Patient 11/18/12 1525     Chief Complaint  Patient presents with  . Dizziness  . Headache   (Consider location/radiation/quality/duration/timing/severity/associated sxs/prior Treatment) Patient is a 42 y.o. female presenting with headaches. The history is provided by the patient. No language interpreter was used.  Headache Pain location:  Generalized Quality:  Sharp Radiates to:  Does not radiate Context: not activity, not exposure to bright light and not loud noise   Associated symptoms: blurred vision and numbness   Associated symptoms: no fever, no nausea, no photophobia, no sinus pressure, no vomiting and no weakness    Pt is a 42 year old who presents with a headache for the last 1-2 days that has not gone away. She reports that this headache has been gradual and feels like a band the extends from one side to the other. She also reports some tingling and paresthesia to her left jaw and the left side of her face. She denies fever, chills, nausea, vomiting or diarrhea. No known sick contact or exposure. She reports that she has had migraines in the past and this one is slightly different. She reports that she has had some blurry vision with this one as well. She has a history of a TIA but she says it has been a long time ago. She denies any unilateral weakness, dizziness, or gait disturbances.  Past Medical History  Diagnosis Date  . Diabetes mellitus   . Fibromyalgia   . Hyperlipidemia   . Abdominal pain   . Nausea & vomiting   . Biliary dyskinesia   . Depression   . Joint pain   . Skin rash     due to medications  . Post traumatic stress disorder (PTSD)   . Sleep trouble   . Major depressive disorder   . Cervical strain   . Closed head injury 2010  . Renal stones   . Migraine     hx of none recent  . Arthritis    Past Surgical History  Procedure Laterality Date  . Lipoma  removed  yrs ago  . Knee surgery Left 2012  . Cesarean section  1997, 1999  . Cholecystectomy  02/10/2011    Procedure: LAPAROSCOPIC CHOLECYSTECTOMY WITH INTRAOPERATIVE CHOLANGIOGRAM;  Surgeon: Rulon Abide, DO;  Location: [facility] OR;  Service: General;  Laterality: N/A;  . Robotic assisted laparoscopic lysis of adhesion N/A 03/04/2012    Procedure: ROBOTIC ASSISTED LAPAROSCOPIC LYSIS OF EXTENSIVE ADHESIONS;  Surgeon: Tresa Endo A. Ernestina Penna, MD;  Location: [facility] ORS;  Service: Gynecology;  Laterality: N/A;  . Tonsillectomy  2001  . Abdominal hysterectomy  2001  . Eye surgery  2006    laser eye surgery left eye  . Exploratory laparomty  Mar 04, 2012  . Laparoscopic appendectomy N/A 10/12/2012    Procedure: DIAGNOSTIC APPENDECTOMY LAPAROSCOPIC;  Surgeon: Lodema Pilot, DO;  Location: [facility] ORS;  Service: General;  Laterality: N/A;  . Appendectomy     Family History  Problem Relation Age of Onset  . Cancer Father     lymphoma  . Nephrolithiasis Father   . Vascular Disease Father   . Cancer Maternal Grandmother     colon  . Hypertension Mother   . Heart disease Mother   . Cataracts Mother   . Diabetes Brother   . Mental illness Maternal Grandfather   . Cancer Maternal Grandfather   . Heart disease  Paternal Grandmother   . Heart disease Paternal Grandfather    History  Substance Use Topics  . Smoking status: Never Smoker   . Smokeless tobacco: Never Used  . Alcohol Use: No   OB History   Grav Para Term Preterm Abortions TAB SAB Ect Mult Living                 Review of Systems  Constitutional: Negative for fever.  HENT: Negative for sinus pressure.   Eyes: Positive for blurred vision. Negative for photophobia.  Gastrointestinal: Negative for nausea and vomiting.  Neurological: Positive for numbness and headaches.  All other systems reviewed and are negative.    Allergies  Meloxicam; Cephalexin; and Ibuprofen  Home Medications   Current Outpatient Rx  Name  Route  Sig   Dispense  Refill  . aspirin EC 81 MG tablet   Oral   Take 81 mg by mouth daily.         Marland Kitchen atorvastatin (LIPITOR) 40 MG tablet   Oral   Take 40 mg by mouth daily.         Marland Kitchen gabapentin (NEURONTIN) 300 MG capsule   Oral   Take 300 mg by mouth daily.         . insulin aspart (NOVOLOG) 100 UNIT/ML injection   Subcutaneous   Inject 8 Units into the skin 3 (three) times daily with meals.         . insulin glargine (LANTUS) 100 UNIT/ML injection   Subcutaneous   Inject 28 Units into the skin every evening.         Marland Kitchen HYDROcodone-acetaminophen (NORCO) 5-325 MG per tablet   Oral   Take 1 tablet by mouth every 6 (six) hours as needed for pain.   30 tablet   0     Hand written and at front desk for pick up    BP 137/66  Pulse 86  Temp(Src) 98.8 F (37.1 C) (Oral)  Resp 20  Ht 5\' 4"  (1.626 m)  Wt 175 lb (79.379 kg)  BMI 30.02 kg/m2  SpO2 100% Physical Exam  Nursing note and vitals reviewed. Constitutional: She is oriented to person, place, and time. She appears well-developed and well-nourished. No distress.  HENT:  Head: Normocephalic and atraumatic.  Right Ear: External ear normal.  Left Ear: External ear normal.  Nose: Nose normal.  Mouth/Throat: Oropharynx is clear and moist.  Eyes: Conjunctivae and EOM are normal. Pupils are equal, round, and reactive to light.  Neck: Normal range of motion. No JVD present. No thyromegaly present.  Cardiovascular: Normal rate, regular rhythm, normal heart sounds and intact distal pulses.   Pulmonary/Chest: Effort normal and breath sounds normal.  Abdominal: Soft. Bowel sounds are normal. She exhibits no distension. There is no tenderness.  Lymphadenopathy:    She has no cervical adenopathy.  Neurological: She is alert and oriented to person, place, and time. She has normal strength. No cranial nerve deficit or sensory deficit. She displays a negative Romberg sign. Coordination and gait normal. GCS eye subscore is 4. GCS verbal  subscore is 5. GCS motor subscore is 6.  Skin: Skin is warm and dry.  Psychiatric: She has a normal mood and affect. Her behavior is normal. Judgment and thought content normal.    ED Course  Procedures (including critical care time) Labs Review Labs Reviewed  CBC - Abnormal; Notable for the following:    WBC 14.5 (*)    Hemoglobin 15.1 (*)    All  other components within normal limits  BASIC METABOLIC PANEL - Abnormal; Notable for the following:    Glucose, Bld 105 (*)    GFR calc non Af Amer 73 (*)    GFR calc Af Amer 85 (*)    All other components within normal limits  GLUCOSE, CAPILLARY  POCT I-STAT TROPONIN I   Imaging Review No results found.  EKG Interpretation   None       MDM   1. Migraine    Resolution of headache with IV fluids and several meds. No focal deficits or weakness. Neuro exam reassuring. Paresthesia and tingling in jaw resolved. No fever, vomiting, neck pain or stiffness. No mental status changes. Good strength and coordination. Ambulatory in room before discharge. Discussed plan with pt and she agrees to go home and orally hydrate and rest. She also agrees to follow-up with her neurologist.       Irish Elders, NP 11/18/12 1958

## 2012-11-19 NOTE — ED Provider Notes (Signed)
Medical screening examination/treatment/procedure(s) were performed by non-physician practitioner and as supervising physician I was immediately available for consultation/collaboration.  EKG Interpretation   None         Roney Marion, MD 11/19/12 1124

## 2012-11-22 ENCOUNTER — Ambulatory Visit
Admission: RE | Admit: 2012-11-22 | Discharge: 2012-11-22 | Disposition: A | Discharge status: Home / Self Care | Payer: Managed Care, Other (non HMO) | Source: Ambulatory Visit | Attending: Family Medicine | Admitting: Family Medicine

## 2012-11-22 ENCOUNTER — Telehealth: Payer: Self-pay

## 2012-11-22 DIAGNOSIS — R2230 Localized swelling, mass and lump, unspecified upper limb: Secondary | ICD-10-CM

## 2012-11-22 DIAGNOSIS — L723 Sebaceous cyst: Secondary | ICD-10-CM

## 2012-11-22 NOTE — Telephone Encounter (Signed)
PT STATES SHE HAS A MAMMOGRAM AND AN ULTRASOUND DONE, BUT NOW NEEDS A REFERRAL TO A SURGEON AND WANTS TO KNOW IF DR LAUENSTEIN CAN MAKE THE REFERRAL FOR HER

## 2012-11-22 NOTE — Telephone Encounter (Signed)
The breast center normally handles this for our patients, unless you prefer a specific surgeon.

## 2012-12-01 ENCOUNTER — Ambulatory Visit (INDEPENDENT_AMBULATORY_CARE_PROVIDER_SITE_OTHER): Payer: Managed Care, Other (non HMO) | Admitting: Family Medicine

## 2012-12-01 VITALS — BP 138/76 | HR 89 | Temp 98.5°F | Resp 16 | Ht 64.5 in | Wt 183.8 lb

## 2012-12-01 DIAGNOSIS — E785 Hyperlipidemia, unspecified: Secondary | ICD-10-CM

## 2012-12-01 DIAGNOSIS — E109 Type 1 diabetes mellitus without complications: Secondary | ICD-10-CM

## 2012-12-01 DIAGNOSIS — L723 Sebaceous cyst: Secondary | ICD-10-CM

## 2012-12-01 LAB — POCT GLYCOSYLATED HEMOGLOBIN (HGB A1C): Hemoglobin A1C: 7.3

## 2012-12-01 MED ORDER — ATORVASTATIN CALCIUM 40 MG PO TABS: 40.0000 mg | ORAL_TABLET | Freq: Every day | ORAL | Status: DC

## 2012-12-01 NOTE — Progress Notes (Signed)
Subjective: 42 year old lady who has a right axillary cyst that she would like removed. It was previously noted here. She then had her mammogram done, at which time they also did an ultrasound of it. It confirmed that it was a complex cyst. She worries a lot about it would like it removed. It is noninflamed at this time.  She tends to worry a lot, especially the cause of a difficult episode she went through with her appendix for a long time, which did well after was finally removed.  She has a history of high lipids, and recently had lab work done through the [facility]. She forgot to bring them with her, but the numbers were excellent she says. She is very happy on the Lipitor.  She has a history of type 1 diabetes since she was 42 years old.. She is in the process of trying to find a new endocrinologist, and would like a referral. She currently takes NovoLog and Lantus.  Objective: Pleasant lady in no major distress. She has a 1.5 cm cyst in her lateral aspect of the right axilla. It is not inflamed or tender. It is a little scarring of the skin on the surface it looks like where it may have drained some in the past . It is nontender.  Assessment: Right axillary cyst Hyperlipidemia Type 1 diabetes  Plan: We'll make referral to an endocrinologist for her Check C. met and hemoglobin A1c  Eula Listen PA will excise cyst  Results for orders placed in visit on 12/01/12  POCT GLYCOSYLATED HEMOGLOBIN (HGB A1C)      Result Value Range   Hemoglobin A1C 7.3

## 2012-12-01 NOTE — Telephone Encounter (Signed)
Sebaceous cyst versus epidermal inclusion cyst in the right axilla.  No evidence of malignancy in either breast.  RECOMMENDATION:  Bilateral screening mammogram in 1 year.  I have discussed the findings and recommendations with the patient.  Results were also provided in writing at the conclusion of the visit.   Patient wants the area/cyst removed from her right axillary area, wants you to refer to surgeon, please advise.

## 2012-12-01 NOTE — Progress Notes (Signed)
   Patient ID: Maureen [name] MRN: [id number], DOB: 10-Apr-1970, 42 y.o. Date of Encounter: 12/01/2012, 7:43 PM    PROCEDURE NOTE: Verbal consent obtained. Risks and benefits of the procedure were explained to the patient. Patient made an informed decision to proceed with the procedure. Betadine prep per usual protocol. Local anesthesia obtained with 1% lidocaine with epi 2 cc.  1.5 cm incision made with 11 blade along lesion.  No culture taken. No purulence expressed. Moderate sebaceous material expressed. Cyst wall removed in its entirety.  Irrigated with normal saline.  Packed with 1/4 plain packing secondary to sutures in this area may cause some discomfort. I doubt she will need any further packing.  Dressed. Wound care instructions including precautions with patient. Patient tolerated the procedure well. Recheck in 48 hours.      Signed, Eula Listen, PA-C [facility] [place], [place] [address] [phone] 12/01/2012 7:43 PM

## 2012-12-01 NOTE — Patient Instructions (Signed)
Continue your current medications.  We will more common referral to endocrinologist on referral to endocrinologist

## 2012-12-01 NOTE — Telephone Encounter (Signed)
Pt statest that the breast center is telling her that we need to be the one to schedule her with a surgeon

## 2012-12-02 ENCOUNTER — Telehealth: Payer: Self-pay

## 2012-12-02 ENCOUNTER — Ambulatory Visit (INDEPENDENT_AMBULATORY_CARE_PROVIDER_SITE_OTHER): Payer: Managed Care, Other (non HMO) | Admitting: Physician Assistant

## 2012-12-02 VITALS — BP 132/70 | HR 70 | Temp 98.4°F | Resp 16 | Ht 64.0 in | Wt 182.0 lb

## 2012-12-02 DIAGNOSIS — L723 Sebaceous cyst: Secondary | ICD-10-CM

## 2012-12-02 LAB — COMPREHENSIVE METABOLIC PANEL
ALT: 51 U/L — ABNORMAL HIGH (ref 0–35)
AST: 16 U/L (ref 0–37)
Albumin: 4 g/dL (ref 3.5–5.2)
Alkaline Phosphatase: 167 U/L — ABNORMAL HIGH (ref 39–117)
BUN: 13 mg/dL (ref 6–23)
CO2: 27 mEq/L (ref 19–32)
Calcium: 10.4 mg/dL (ref 8.4–10.5)
Chloride: 99 mEq/L (ref 96–112)
Creat: 0.88 mg/dL (ref 0.50–1.10)
Glucose, Bld: 284 mg/dL — ABNORMAL HIGH (ref 70–99)
Potassium: 4.3 mEq/L (ref 3.5–5.3)
Sodium: 140 mEq/L (ref 135–145)
Total Bilirubin: 0.3 mg/dL (ref 0.3–1.2)
Total Protein: 7.7 g/dL (ref 6.0–8.3)

## 2012-12-02 MED ORDER — HYDROCODONE-ACETAMINOPHEN 5-325 MG PO TABS: 1.0000 | ORAL_TABLET | Freq: Three times a day (TID) | ORAL | Status: DC | PRN

## 2012-12-02 NOTE — Progress Notes (Signed)
Patient ID: Maureen [name] MRN: [id number], DOB: 02/25/1970 42 y.o. Date of Encounter: 12/02/2012, 11:52 AM  Chief Complaint: Wound care   See previous note  HPI: 42 y.o. y/o female presents for wound care s/p I&D on 12/01/12.   Cyst was a sebaceous cyst - not infected. Capsule removed but no sutures placed due to location Patient is here today because she is having "excrutiating pain" in the area.  Admits it is no better than yesterday No issues or complaints Afebrile/ no chills No nausea or vomiting Previous note reviewed  Past Medical History  Diagnosis Date  . Diabetes mellitus   . Fibromyalgia   . Hyperlipidemia   . Abdominal pain   . Nausea & vomiting   . Biliary dyskinesia   . Depression   . Joint pain   . Skin rash     due to medications  . Post traumatic stress disorder (PTSD)   . Sleep trouble   . Major depressive disorder   . Cervical strain   . Closed head injury 2010  . Renal stones   . Migraine     hx of none recent  . Arthritis      Home Meds: Prior to Admission medications   Medication Sig Start Date End Date Taking? Authorizing Provider  aspirin EC 81 MG tablet Take 81 mg by mouth daily.   Yes Historical Provider, MD  atorvastatin (LIPITOR) 40 MG tablet Take 1 tablet (40 mg total) by mouth daily. 12/01/12  Yes Peyton Najjar, MD  insulin aspart (NOVOLOG) 100 UNIT/ML injection Inject 8 Units into the skin 3 (three) times daily with meals.   Yes Historical Provider, MD  insulin glargine (LANTUS) 100 UNIT/ML injection Inject 28 Units into the skin every evening.   Yes Historical Provider, MD  butalbital-acetaminophen-caffeine (FIORICET) 50-325-40 MG per tablet Take 1-2 tablets by mouth every 8 (eight) hours as needed for headache. 11/18/12 11/18/13  Irish Elders, NP  gabapentin (NEURONTIN) 300 MG capsule Take 300 mg by mouth daily.    Historical Provider, MD  HYDROcodone-acetaminophen (NORCO) 5-325 MG per tablet Take 1 tablet by mouth every 6 (six)  hours as needed for pain. 10/19/12   Valarie Merino, MD  HYDROcodone-acetaminophen (NORCO) 5-325 MG per tablet Take 1 tablet by mouth every 8 (eight) hours as needed. 12/02/12   Nelva Nay, PA-C    Allergies:  Allergies  Allergen Reactions  . Meloxicam Nausea Only  . Cephalexin Rash  . Ibuprofen Nausea And Vomiting and Rash    ROS: Constitutional: Afebrile, no chills Cardiovascular: negative for chest pain or palpitations Dermatological: Positive for wound. Negative for erythema, pain, or warmth  GI: No nausea or vomiting   EXAM: Physical Exam: Blood pressure 132/70, pulse 70, temperature 98.4 F (36.9 C), temperature source Oral, resp. rate 16, height 5\' 4"  (1.626 m), weight 182 lb (82.555 kg), SpO2 98.00%., Body mass index is 31.22 kg/(m^2). General: Well developed, well nourished, in no acute distress. Nontoxic appearing. Head: Normocephalic, atraumatic, sclera non-icteric.  Neck: Supple. Lungs: Breathing is unlabored. Heart: Normal rate. Skin:  Warm and moist. Dressing and packing in place. No induration, erythema, or tenderness to palpation. Neuro: Alert and oriented X 3. Moves all extremities spontaneously. Normal gait.  Psych:  Responds to questions appropriately with a normal affect.       PROCEDURE: Dressing and packing removed. No purulence or sebaceous material expressed Wound bed healthy Irrigated with 1% plain lidocaine 5 cc. Repacked with 1/4 plain packing Dressing  applied  LAB: Culture:   A/P: 42 y.o. y/o female with s/p removal of sebaceous cyst of right axilla on 12/01/12.  Wound care per above Small quantity of Norco 5/325 provided.  Daily dressing changes Recheck 48 hours. Likely will not need to repack at that time.   Grier Mitts, PA-C 12/02/2012 11:52 AM

## 2012-12-02 NOTE — Telephone Encounter (Signed)
She should come back in today if it is painful. Called her to advise. She will come in, told her to let check in know I have authorized her fast track for today instead of tomorrow.

## 2012-12-02 NOTE — Telephone Encounter (Signed)
Patient states that she had a cyst removed from her under arm and that it is hurting very bad. Wants to know how to stop the pain until she comes back in to have it stitched up. Patient also needs a refill on her Lipitor sent to [facility].  [phone]

## 2012-12-04 ENCOUNTER — Ambulatory Visit (INDEPENDENT_AMBULATORY_CARE_PROVIDER_SITE_OTHER): Payer: Managed Care, Other (non HMO) | Admitting: Physician Assistant

## 2012-12-04 VITALS — BP 100/60 | HR 82 | Temp 98.8°F | Resp 16 | Ht 65.0 in | Wt 186.8 lb

## 2012-12-04 DIAGNOSIS — L723 Sebaceous cyst: Secondary | ICD-10-CM

## 2012-12-04 NOTE — Progress Notes (Signed)
Patient ID: Maureen [name] MRN: [id number], DOB: 11/05/1970 42 y.o. Date of Encounter: 12/04/2012, 2:14 PM  Chief Complaint: Wound care   See previous note  HPI: 42 y.o. y/o female presents for wound care s/p I&D on 12/01/12.  Doing well No issues or complaints Afebrile/ no chills No nausea or vomiting Not taking any antibiotic.  Pain resolved.  Daily dressing change Previous note reviewed  Past Medical History  Diagnosis Date  . Diabetes mellitus   . Fibromyalgia   . Hyperlipidemia   . Abdominal pain   . Nausea & vomiting   . Biliary dyskinesia   . Depression   . Joint pain   . Skin rash     due to medications  . Post traumatic stress disorder (PTSD)   . Sleep trouble   . Major depressive disorder   . Cervical strain   . Closed head injury 2010  . Renal stones   . Migraine     hx of none recent  . Arthritis      Home Meds: Prior to Admission medications   Medication Sig Start Date End Date Taking? Authorizing Provider  aspirin EC 81 MG tablet Take 81 mg by mouth daily.   Yes Historical Provider, MD  atorvastatin (LIPITOR) 40 MG tablet Take 1 tablet (40 mg total) by mouth daily. 12/01/12  Yes Peyton Najjar, MD  butalbital-acetaminophen-caffeine (FIORICET) 5611285429 MG per tablet Take 1-2 tablets by mouth every 8 (eight) hours as needed for headache. 11/18/12 11/18/13 Yes Irish Elders, NP  gabapentin (NEURONTIN) 300 MG capsule Take 300 mg by mouth daily.   Yes Historical Provider, MD  HYDROcodone-acetaminophen (NORCO) 5-325 MG per tablet Take 1 tablet by mouth every 6 (six) hours as needed for pain. 10/19/12  Yes Valarie Merino, MD  HYDROcodone-acetaminophen (NORCO) 5-325 MG per tablet Take 1 tablet by mouth every 8 (eight) hours as needed. 12/02/12  Yes Tysen Roesler M Kalei Mckillop, PA-C  insulin aspart (NOVOLOG) 100 UNIT/ML injection Inject 8 Units into the skin 3 (three) times daily with meals.   Yes Historical Provider, MD  insulin glargine (LANTUS) 100 UNIT/ML  injection Inject 28 Units into the skin every evening.   Yes Historical Provider, MD    Allergies:  Allergies  Allergen Reactions  . Meloxicam Nausea Only  . Cephalexin Rash  . Ibuprofen Nausea And Vomiting and Rash    ROS: Constitutional: Afebrile, no chills Cardiovascular: negative for chest pain or palpitations Dermatological: Positive for wound. Negative for erythema, pain, or warmth.  GI: No nausea or vomiting   EXAM: Physical Exam: Blood pressure 100/60, pulse 82, temperature 98.8 F (37.1 C), temperature source Oral, resp. rate 16, height 5\' 5"  (1.651 m), weight 186 lb 12.8 oz (84.732 kg), SpO2 97.00%., Body mass index is 31.09 kg/(m^2). General: Well developed, well nourished, in no acute distress. Nontoxic appearing. Head: Normocephalic, atraumatic, sclera non-icteric.  Neck: Supple. Lungs: Breathing is unlabored. Heart: Normal rate. Skin:  Warm and moist. Dressing and packing in place. No induration, erythema, or tenderness to palpation. Neuro: Alert and oriented X 3. Moves all extremities spontaneously. Normal gait.  Psych:  Responds to questions appropriately with a normal affect.       PROCEDURE: Dressing and packing removed. No purulence expressed Wound bed healthy Not repacked.  Dressing applied  LAB: Culture: not done  A/P: 42 y.o. y/o female with sebaceous cyst of axilla as above s/p I&D on 12/01/12.  Wound care per above Pain well controlled Daily dressing changes Recheck as  needed  Grier Mitts, PA-C 12/04/2012 2:14 PM

## 2012-12-05 ENCOUNTER — Encounter: Payer: Self-pay | Admitting: Family Medicine

## 2012-12-06 ENCOUNTER — Ambulatory Visit (INDEPENDENT_AMBULATORY_CARE_PROVIDER_SITE_OTHER): Payer: Managed Care, Other (non HMO) | Admitting: Emergency Medicine

## 2012-12-06 VITALS — BP 154/80 | HR 96 | Temp 99.0°F | Resp 20 | Ht 64.0 in | Wt 184.0 lb

## 2012-12-06 DIAGNOSIS — G8918 Other acute postprocedural pain: Secondary | ICD-10-CM

## 2012-12-06 DIAGNOSIS — Z23 Encounter for immunization: Secondary | ICD-10-CM

## 2012-12-06 DIAGNOSIS — N201 Calculus of ureter: Secondary | ICD-10-CM

## 2012-12-06 DIAGNOSIS — M549 Dorsalgia, unspecified: Secondary | ICD-10-CM

## 2012-12-06 LAB — POCT URINALYSIS DIPSTICK
Bilirubin, UA: NEGATIVE
Blood, UA: NEGATIVE
Glucose, UA: NEGATIVE
Ketones, UA: NEGATIVE
Leukocytes, UA: NEGATIVE
Nitrite, UA: NEGATIVE
Protein, UA: 100
Spec Grav, UA: 1.02
Urobilinogen, UA: 0.2
pH, UA: 7

## 2012-12-06 LAB — POCT UA - MICROSCOPIC ONLY
Casts, Ur, LPF, POC: NEGATIVE
Mucus, UA: POSITIVE
RBC, urine, microscopic: NEGATIVE
Yeast, UA: NEGATIVE

## 2012-12-06 MED ORDER — HYDROCODONE-ACETAMINOPHEN 5-325 MG PO TABS: 1.0000 | ORAL_TABLET | ORAL | Status: DC | PRN

## 2012-12-06 MED ORDER — HYDROCODONE-ACETAMINOPHEN 5-325 MG PO TABS: 1.0000 | ORAL_TABLET | Freq: Four times a day (QID) | ORAL | Status: DC | PRN

## 2012-12-06 NOTE — Progress Notes (Signed)
[facility] [address] [place] [address] [phone]  Date:  12/06/2012   Name:  Maureen [name]   DOB:  [birth year]   MRN:  [id number]  PCP:  Elvina Sidle, MD    Chief Complaint: Back Pain and Abdominal Pain   History of Present Illness:  Maureen [name] is a 42 y.o. very pleasant female patient who presents with the following:  Has pain in right flank. Pain is intermittent and colicy in nature.  History of kidney stones   Not radiating.  No vomiting but nauseated.  No fever or chills. No GU or GYN symptoms.    Patient Active Problem List   Diagnosis Date Noted  . Chest pain 05/18/2012  . Endometriosis 12/08/2011  . Arthritis of left knee 12/08/2011  . Diabetic retinopathy 12/08/2011  . Fibromyalgia 10/23/2011  . DM type 1 (diabetes mellitus, type 1) 10/13/2011    Past Medical History  Diagnosis Date  . Diabetes mellitus   . Fibromyalgia   . Hyperlipidemia   . Abdominal pain   . Nausea & vomiting   . Biliary dyskinesia   . Depression   . Joint pain   . Skin rash     due to medications  . Post traumatic stress disorder (PTSD)   . Sleep trouble   . Major depressive disorder   . Cervical strain   . Closed head injury 2010  . Renal stones   . Migraine     hx of none recent  . Arthritis     Past Surgical History  Procedure Laterality Date  . Lipoma removed  yrs ago  . Knee surgery Left 2012  . Cesarean section  1997, 1999  . Cholecystectomy  02/10/2011    Procedure: LAPAROSCOPIC CHOLECYSTECTOMY WITH INTRAOPERATIVE CHOLANGIOGRAM;  Surgeon: Rulon Abide, DO;  Location: [facility] OR;  Service: General;  Laterality: N/A;  . Robotic assisted laparoscopic lysis of adhesion N/A 03/04/2012    Procedure: ROBOTIC ASSISTED LAPAROSCOPIC LYSIS OF EXTENSIVE ADHESIONS;  Surgeon: Tresa Endo A. Ernestina Penna, MD;  Location: [facility] ORS;  Service: Gynecology;  Laterality: N/A;  . Tonsillectomy  2001  . Abdominal hysterectomy  2001  . Eye surgery  2006   laser eye surgery left eye  . Exploratory laparomty  Mar 04, 2012  . Laparoscopic appendectomy N/A 10/12/2012    Procedure: DIAGNOSTIC APPENDECTOMY LAPAROSCOPIC;  Surgeon: Lodema Pilot, DO;  Location: [facility] ORS;  Service: General;  Laterality: N/A;  . Appendectomy      History  Substance Use Topics  . Smoking status: Never Smoker   . Smokeless tobacco: Never Used  . Alcohol Use: No    Family History  Problem Relation Age of Onset  . Cancer Father     lymphoma  . Nephrolithiasis Father   . Vascular Disease Father   . Cancer Maternal Grandmother     colon  . Hypertension Mother   . Heart disease Mother   . Cataracts Mother   . Diabetes Brother   . Mental illness Maternal Grandfather   . Cancer Maternal Grandfather   . Heart disease Paternal Grandmother   . Heart disease Paternal Grandfather     Allergies  Allergen Reactions  . Meloxicam Nausea Only  . Cephalexin Rash  . Ibuprofen Nausea And Vomiting and Rash    Medication list has been reviewed and updated.  Current Outpatient Prescriptions on File Prior to Visit  Medication Sig Dispense Refill  . aspirin EC 81 MG tablet Take 81 mg  by mouth daily.      Marland Kitchen atorvastatin (LIPITOR) 40 MG tablet Take 1 tablet (40 mg total) by mouth daily.  90 tablet  3  . insulin aspart (NOVOLOG) 100 UNIT/ML injection Inject 8 Units into the skin 3 (three) times daily with meals.      . insulin glargine (LANTUS) 100 UNIT/ML injection Inject 28 Units into the skin every evening.      . butalbital-acetaminophen-caffeine (FIORICET) 50-325-40 MG per tablet Take 1-2 tablets by mouth every 8 (eight) hours as needed for headache.  20 tablet  0  . gabapentin (NEURONTIN) 300 MG capsule Take 300 mg by mouth daily.      Marland Kitchen HYDROcodone-acetaminophen (NORCO) 5-325 MG per tablet Take 1 tablet by mouth every 6 (six) hours as needed for pain.  30 tablet  0  . HYDROcodone-acetaminophen (NORCO) 5-325 MG per tablet Take 1 tablet by mouth every 8 (eight) hours as  needed.  20 tablet  0   No current facility-administered medications on file prior to visit.    Review of Systems:  As per HPI, otherwise negative.    Physical Examination: Filed Vitals:   12/06/12 1257  BP: 154/80  Pulse: 96  Temp: 99 F (37.2 C)  Resp: 20   Filed Vitals:   12/06/12 1257  Height: 5\' 4"  (1.626 m)  Weight: 184 lb (83.462 kg)   Body mass index is 31.57 kg/(m^2). Ideal Body Weight: Weight in (lb) to have BMI = 25: 145.3  GEN: WDWN, NAD, Non-toxic, A & O x 3 HEENT: Atraumatic, Normocephalic. Neck supple. No masses, No LAD. Ears and Nose: No external deformity. CV: RRR, No M/G/R. No JVD. No thrill. No extra heart sounds. PULM: CTA B, no wheezes, crackles, rhonchi. No retractions. No resp. distress. No accessory muscle use. ABD: S, NT, ND, +BS. No rebound. No HSM.  BACK:  Right CVA tenderness EXTR: No c/c/e NEURO Normal gait.  PSYCH: Normally interactive. Conversant. Not depressed or anxious appearing.  Calm demeanor.    Assessment and Plan: Ureterolithiasis vicodin   Signed,  Phillips Odor, MD   Results for orders placed in visit on 12/06/12  POCT UA - MICROSCOPIC ONLY      Result Value Range   WBC, Ur, HPF, POC 8-12     RBC, urine, microscopic Neg     Bacteria, U Microscopic 3+     Mucus, UA Positive     Epithelial cells, urine per micros 15-20     Crystals, Ur, HPF, POC Calcium Oxilate     Casts, Ur, LPF, POC Neg     Yeast, UA neg    POCT URINALYSIS DIPSTICK      Result Value Range   Color, UA yellow     Clarity, UA slighlty cloudy     Glucose, UA neg     Bilirubin, UA neg     Ketones, UA neg     Spec Grav, UA 1.020     Blood, UA neg     pH, UA 7.0     Protein, UA 100     Urobilinogen, UA 0.2     Nitrite, UA neg     Leukocytes, UA Negative

## 2012-12-06 NOTE — Addendum Note (Signed)
Addended by: Carmelina Dane on: 12/06/2012 01:51 PM   Modules accepted: Orders

## 2012-12-06 NOTE — Patient Instructions (Signed)
Ureteral Colic Ureteral colic is spasm-like pain from the kidney or the ureter. This is often caused by a kidney stone. The pain is caused by the stone trying to get through the tubes that pass your pee. HOME CARE   Drink enough fluids to keep your pee (urine) clear or pale yellow.  Strain all your pee. A strainer will be provided. Keep anything caught in the strainer and bring it to your doctor. The stone causing the pain may be very small.  Only take medicine as told by your doctor.  Follow up with your doctor as told. GET HELP RIGHT AWAY IF:   Pain is not controlled with medicine.  Pain continues or gets worse.  The pain changes and there is chest or belly (abdominal) pain.  You pass out (faint).  You cannot pee.  You keep throwing up (vomiting).  You have a temperature by mouth above 102 F (38.9 C), not controlled by medicine. MAKE SURE YOU:   Understand these instructions.  Will watch this condition.  Will get help right away if you are not doing well or get worse. Document Released: 06/24/2007 Document Revised: 03/30/2011 Document Reviewed: 06/24/2007 [employer] Patient Information 2014 [employer].  

## 2012-12-12 ENCOUNTER — Emergency Department (HOSPITAL_COMMUNITY): Payer: Managed Care, Other (non HMO)

## 2012-12-12 ENCOUNTER — Encounter (HOSPITAL_COMMUNITY): Payer: Self-pay | Admitting: Emergency Medicine

## 2012-12-12 ENCOUNTER — Emergency Department (HOSPITAL_COMMUNITY)
Admission: EM | Admit: 2012-12-12 | Discharge: 2012-12-12 | Disposition: A | Discharge status: Home / Self Care | Payer: Managed Care, Other (non HMO) | Attending: Emergency Medicine | Admitting: Emergency Medicine

## 2012-12-12 DIAGNOSIS — Z87442 Personal history of urinary calculi: Secondary | ICD-10-CM | POA: Insufficient documentation

## 2012-12-12 DIAGNOSIS — M129 Arthropathy, unspecified: Secondary | ICD-10-CM | POA: Insufficient documentation

## 2012-12-12 DIAGNOSIS — Z79899 Other long term (current) drug therapy: Secondary | ICD-10-CM | POA: Insufficient documentation

## 2012-12-12 DIAGNOSIS — Z794 Long term (current) use of insulin: Secondary | ICD-10-CM | POA: Insufficient documentation

## 2012-12-12 DIAGNOSIS — Z87828 Personal history of other (healed) physical injury and trauma: Secondary | ICD-10-CM | POA: Insufficient documentation

## 2012-12-12 DIAGNOSIS — E119 Type 2 diabetes mellitus without complications: Secondary | ICD-10-CM | POA: Insufficient documentation

## 2012-12-12 DIAGNOSIS — R51 Headache: Secondary | ICD-10-CM | POA: Insufficient documentation

## 2012-12-12 DIAGNOSIS — R109 Unspecified abdominal pain: Secondary | ICD-10-CM | POA: Insufficient documentation

## 2012-12-12 DIAGNOSIS — Z7982 Long term (current) use of aspirin: Secondary | ICD-10-CM | POA: Insufficient documentation

## 2012-12-12 DIAGNOSIS — Z8679 Personal history of other diseases of the circulatory system: Secondary | ICD-10-CM | POA: Insufficient documentation

## 2012-12-12 DIAGNOSIS — Z872 Personal history of diseases of the skin and subcutaneous tissue: Secondary | ICD-10-CM | POA: Insufficient documentation

## 2012-12-12 DIAGNOSIS — Z8719 Personal history of other diseases of the digestive system: Secondary | ICD-10-CM | POA: Insufficient documentation

## 2012-12-12 DIAGNOSIS — Z8659 Personal history of other mental and behavioral disorders: Secondary | ICD-10-CM | POA: Insufficient documentation

## 2012-12-12 DIAGNOSIS — E785 Hyperlipidemia, unspecified: Secondary | ICD-10-CM | POA: Insufficient documentation

## 2012-12-12 LAB — URINE MICROSCOPIC-ADD ON

## 2012-12-12 LAB — URINALYSIS, ROUTINE W REFLEX MICROSCOPIC
Glucose, UA: NEGATIVE mg/dL
Hgb urine dipstick: NEGATIVE
Ketones, ur: 15 mg/dL — AB
Leukocytes, UA: NEGATIVE
Nitrite: NEGATIVE
Protein, ur: 100 mg/dL — AB
Specific Gravity, Urine: 1.036 — ABNORMAL HIGH (ref 1.005–1.030)
Urobilinogen, UA: 0.2 mg/dL (ref 0.0–1.0)
pH: 5 (ref 5.0–8.0)

## 2012-12-12 MED ORDER — HYDROMORPHONE HCL PF 1 MG/ML IJ SOLN
1.0000 mg | Freq: Once | INTRAMUSCULAR | Status: AC
  Administered 2012-12-12: 1 mg via INTRAMUSCULAR
  Filled 2012-12-12: qty 1

## 2012-12-12 MED ORDER — ONDANSETRON 4 MG PO TBDP
4.0000 mg | ORAL_TABLET | Freq: Once | ORAL | Status: AC
  Administered 2012-12-12: 4 mg via ORAL
  Filled 2012-12-12: qty 1

## 2012-12-12 MED ORDER — KETOROLAC TROMETHAMINE 30 MG/ML IJ SOLN
30.0000 mg | Freq: Once | INTRAMUSCULAR | Status: AC
  Administered 2012-12-12: 30 mg via INTRAMUSCULAR
  Filled 2012-12-12: qty 1

## 2012-12-12 MED ORDER — HYDROCODONE-ACETAMINOPHEN 5-325 MG PO TABS: 1.0000 | ORAL_TABLET | Freq: Four times a day (QID) | ORAL | Status: DC | PRN

## 2012-12-12 NOTE — ED Provider Notes (Signed)
CSN: [id number]     Arrival date & time 12/12/12  1018 History   First MD Initiated Contact with Patient 12/12/12 1116     Chief Complaint  Patient presents with  . Flank Pain   (Consider location/radiation/quality/duration/timing/severity/associated sxs/prior Treatment) HPI Patient presents with concerns of headache.  She states that this pain began approximately one week ago.  Since onset has been waxing/waning, increasing in severity.  Pain began one week ago.  Since onset the pain has been focally about the right flank, with occasional pain towards the anterior abdomen.  There is minimal associated urinary changes, no nausea, vomiting, diarrhea, other abdominal pain, anorexia, fevers, chills.  The patient endorses a history of kidney stones.  Past Medical History  Diagnosis Date  . Diabetes mellitus   . Fibromyalgia   . Hyperlipidemia   . Abdominal pain   . Nausea & vomiting   . Biliary dyskinesia   . Depression   . Joint pain   . Skin rash     due to medications  . Post traumatic stress disorder (PTSD)   . Sleep trouble   . Major depressive disorder   . Cervical strain   . Closed head injury 2010  . Renal stones   . Migraine     hx of none recent  . Arthritis    Past Surgical History  Procedure Laterality Date  . Lipoma removed  yrs ago  . Knee surgery Left 2012  . Cesarean section  1997, 1999  . Cholecystectomy  02/10/2011    Procedure: LAPAROSCOPIC CHOLECYSTECTOMY WITH INTRAOPERATIVE CHOLANGIOGRAM;  Surgeon: Rulon Abide, DO;  Location: [facility] OR;  Service: General;  Laterality: N/A;  . Robotic assisted laparoscopic lysis of adhesion N/A 03/04/2012    Procedure: ROBOTIC ASSISTED LAPAROSCOPIC LYSIS OF EXTENSIVE ADHESIONS;  Surgeon: Tresa Endo A. Ernestina Penna, MD;  Location: [facility] ORS;  Service: Gynecology;  Laterality: N/A;  . Tonsillectomy  2001  . Abdominal hysterectomy  2001  . Eye surgery  2006    laser eye surgery left eye  . Exploratory laparomty  Mar 04, 2012  .  Laparoscopic appendectomy N/A 10/12/2012    Procedure: DIAGNOSTIC APPENDECTOMY LAPAROSCOPIC;  Surgeon: Lodema Pilot, DO;  Location: [facility] ORS;  Service: General;  Laterality: N/A;  . Appendectomy     Family History  Problem Relation Age of Onset  . Cancer Father     lymphoma  . Nephrolithiasis Father   . Vascular Disease Father   . Cancer Maternal Grandmother     colon  . Hypertension Mother   . Heart disease Mother   . Cataracts Mother   . Diabetes Brother   . Mental illness Maternal Grandfather   . Cancer Maternal Grandfather   . Heart disease Paternal Grandmother   . Heart disease Paternal Grandfather    History  Substance Use Topics  . Smoking status: Never Smoker   . Smokeless tobacco: Never Used  . Alcohol Use: No   OB History   Grav Para Term Preterm Abortions TAB SAB Ect Mult Living                 Review of Systems  All other systems reviewed and are negative.    Allergies  Meloxicam; Cephalexin; and Ibuprofen  Home Medications   Current Outpatient Rx  Name  Route  Sig  Dispense  Refill  . aspirin EC 81 MG tablet   Oral   Take 81 mg by mouth daily.         Marland Kitchen  atorvastatin (LIPITOR) 40 MG tablet   Oral   Take 1 tablet (40 mg total) by mouth daily.   90 tablet   3   . insulin aspart (NOVOLOG) 100 UNIT/ML injection   Subcutaneous   Inject 8 Units into the skin 3 (three) times daily with meals.         . insulin glargine (LANTUS) 100 UNIT/ML injection   Subcutaneous   Inject 28 Units into the skin every evening.         Marland Kitchen HYDROcodone-acetaminophen (NORCO/VICODIN) 5-325 MG per tablet   Oral   Take 1 tablet by mouth every 6 (six) hours as needed.   15 tablet   0    BP 145/70  Pulse 94  Temp(Src) 98.3 F (36.8 C)  Resp 16  Wt 183 lb (83.008 kg)  SpO2 99% Physical Exam  Nursing note and vitals reviewed. Constitutional: She is oriented to person, place, and time. She appears well-developed and well-nourished. No distress.  HENT:  Head:  Normocephalic and atraumatic.  Eyes: Conjunctivae and EOM are normal.  Cardiovascular: Normal rate and regular rhythm.   Pulmonary/Chest: Effort normal and breath sounds normal. No stridor. No respiratory distress.  Abdominal: Soft. Normal appearance and bowel sounds are normal. She exhibits no distension. There is tenderness. There is CVA tenderness. There is no rigidity, no rebound and no guarding.  Musculoskeletal: She exhibits no edema.  Neurological: She is alert and oriented to person, place, and time. No cranial nerve deficit.  Skin: Skin is warm and dry.  Psychiatric: She has a normal mood and affect.    ED Course  Procedures (including critical care time) Labs Review Labs Reviewed  URINALYSIS, ROUTINE W REFLEX MICROSCOPIC - Abnormal; Notable for the following:    Color, Urine AMBER (*)    APPearance CLOUDY (*)    Specific Gravity, Urine 1.036 (*)    Bilirubin Urine SMALL (*)    Ketones, ur 15 (*)    Protein, ur 100 (*)    All other components within normal limits  URINE MICROSCOPIC-ADD ON - Abnormal; Notable for the following:    Squamous Epithelial / LPF MANY (*)    All other components within normal limits   Imaging Review US Renal  12/12/2012   CLINICAL DATA:  Right flank pain.  History of stones.  EXAM: RENAL/URINARY TRACT ULTRASOUND COMPLETE  COMPARISON:  CT 09/13/2012.  FINDINGS: Right Kidney  Length: 13.1 cm. Tiny 3 mm hyperechoic focus noted right kidney without shadowing. 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 Tiny non shadowing stone cannot be excluded. Renal echotexture otherwise normal. No hydronephrosis.  Left Kidney  Length: 13.1 cm. Echogenicity within normal limits. No mass or hydronephrosis visualized.  Bladder  Appears normal for degree of bladder distention.  IMPRESSION: 1. 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 2. Exam otherwise normal. No evidence of hydronephrosis. No evidence of bladder distention.    Electronically Signed   By: Maisie Fus  Register   On: 12/12/2012 14:21    EKG Interpretation   None      3:38 PM On repeat exam the patient appears well. We discussed all results, the need for outpatient followup.  She is amenable to this plan, denies any ongoing pain the MDM   1. Flank pain    This generally well-appearing female with history of kidney stones presents with flank pain.  On exam she is awake alert, has mild ketonuria, but otherwise largely reassuring initial urine and labs.  Patient's evaluation does not demonstrate kidney stone, and shavers substantially here.  With her history, her ongoing pain, she was treated with analgesics and discharged to follow up with her urologist promptly.    Gerhard Munch, MD 12/12/12 1539

## 2012-12-12 NOTE — ED Notes (Signed)
Pt alert and mentating appropriately upon d/c. Pt given d/c teaching, follow up care instructions and prescriptions. Pt verbalizes understanding and has no further questions upon d/c. Pt ambulatory with steady gait leaving ER. NAD noted upon d/c. Pt instructed not to drive. Pt states she has a friend coming to get her from ER.

## 2012-12-12 NOTE — ED Notes (Signed)
Rt flank pain since last week went to her dr and was told she has kidney stones was also told to come er if she got worse and she is nauseated

## 2012-12-13 IMAGING — RF DG CHOLANGIOGRAM OPERATIVE
1 series · 4 of 4 positions shown · non-contrast
Comparison: None

CLINICAL DATA: Abdominal pain

INTRAOPERATIVE CHOLANGIOGRAM
TECHNIQUE: Cholangiographic images from the C-arm fluoroscopic
device were submitted for interpretation post-operatively.  Please
see the procedural report for the amount of contrast and the
fluoroscopy time utilized.

[Series 1: run · 4 of 21 frames shown]
[frame 4/21]
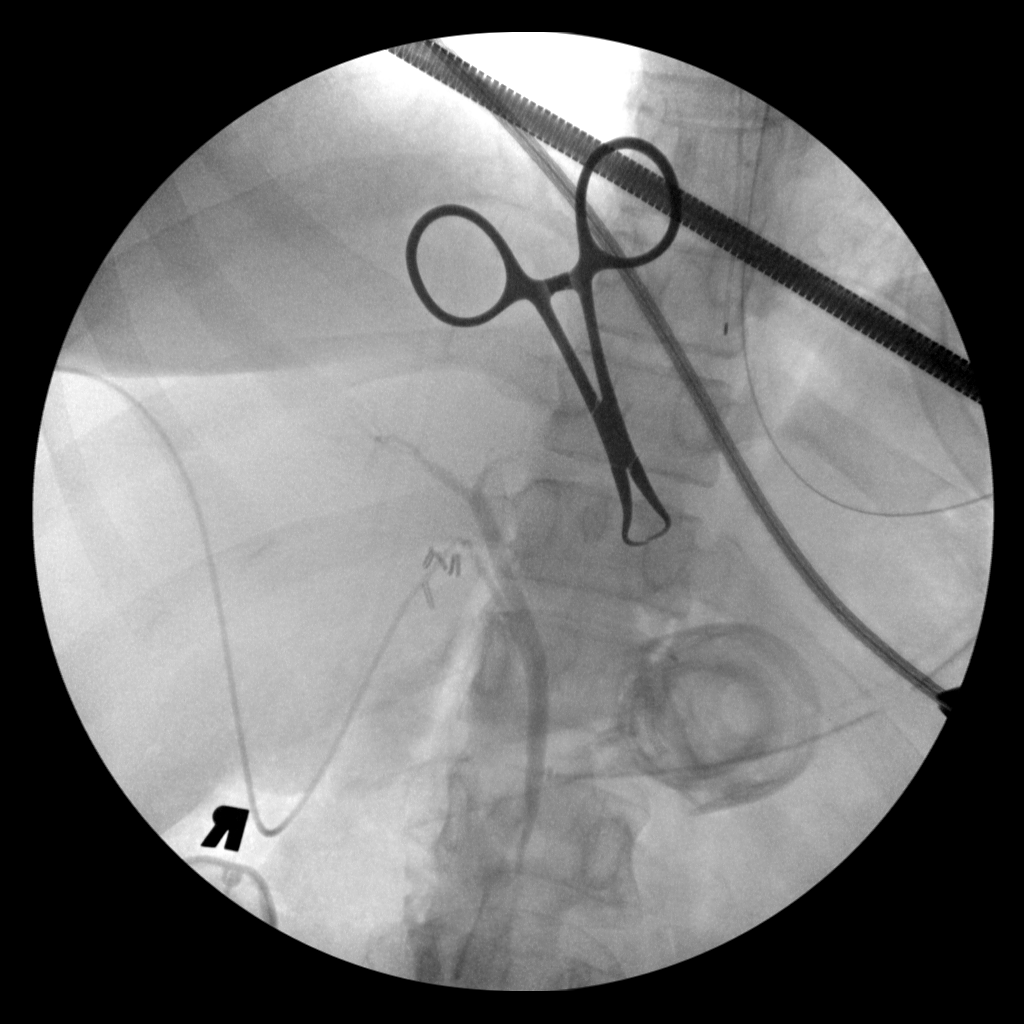
[frame 11/21]
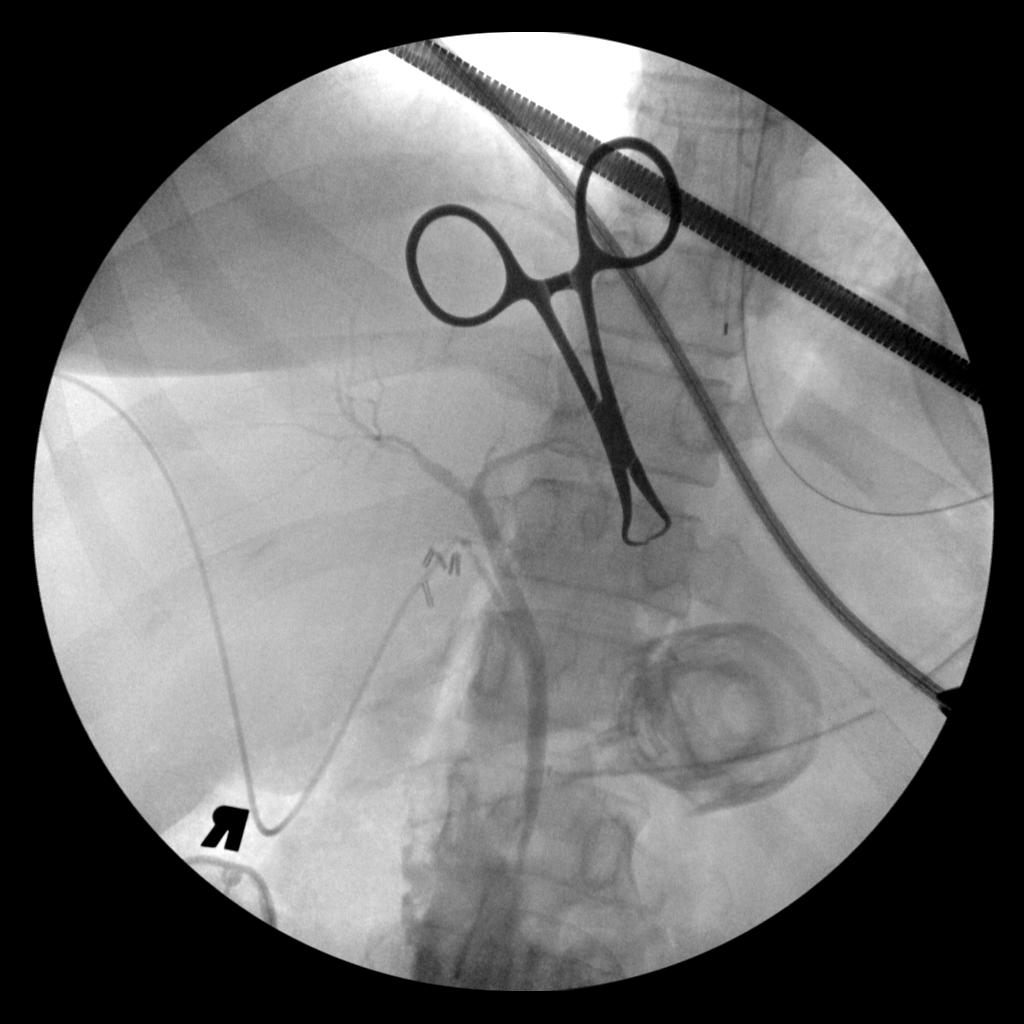
[frame 18/21]
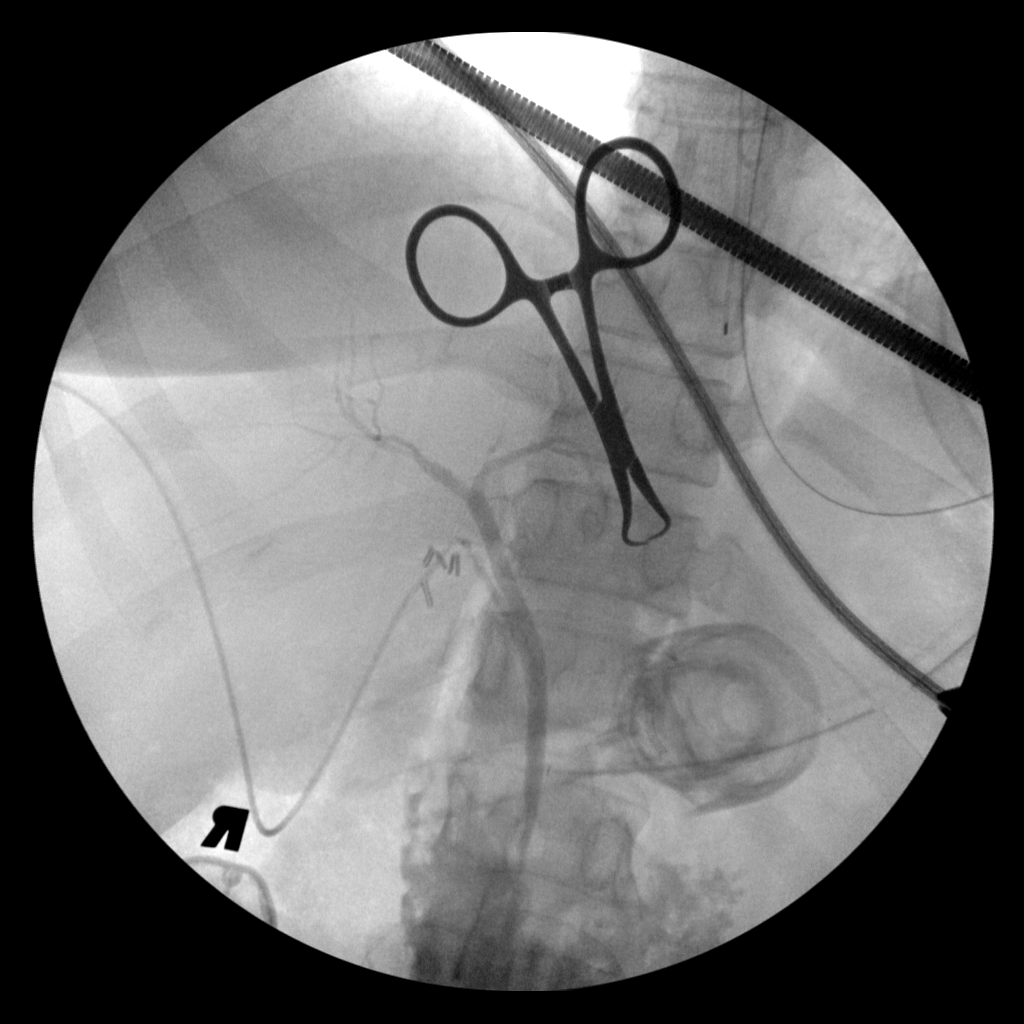
[frame 21/21]
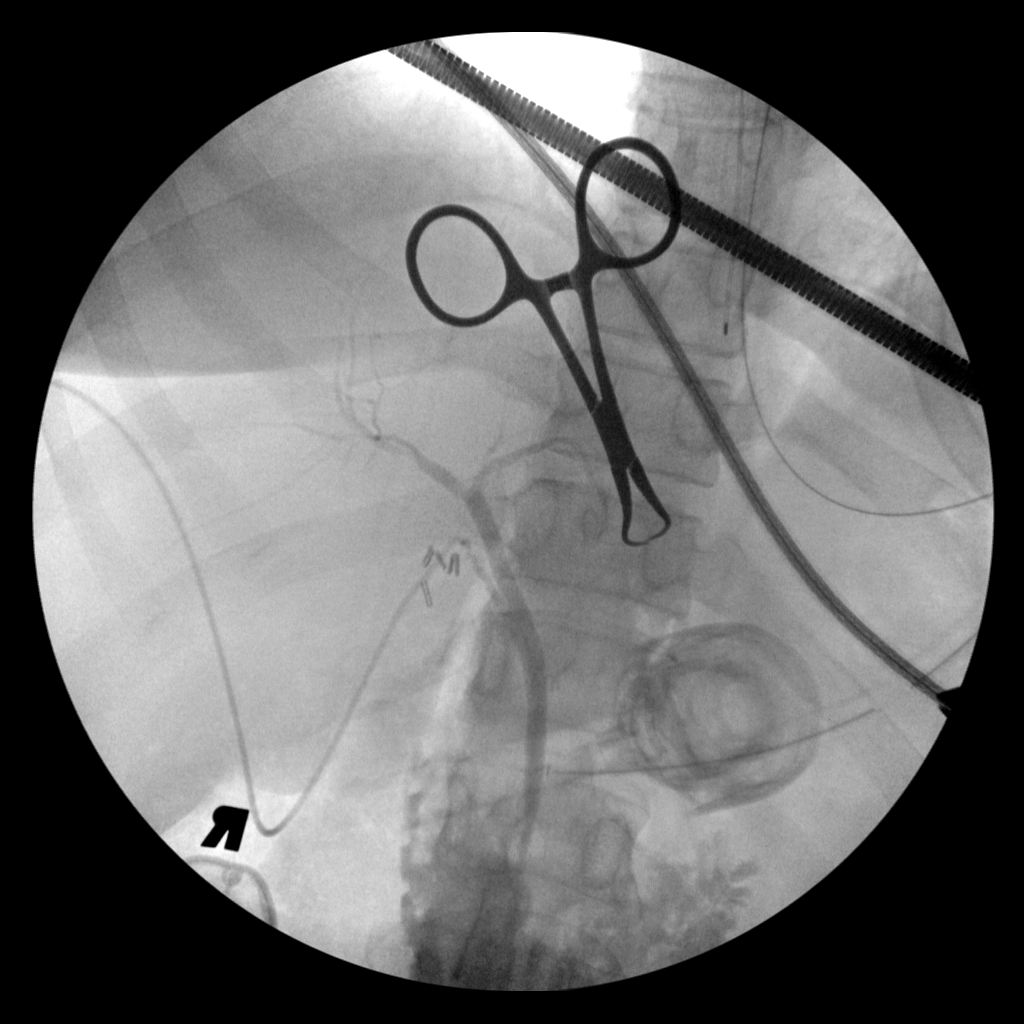

[4 of 4 positions shown; findings below may reference images not displayed]

FINDINGS: No persistent filling defects in the common duct.
Intrahepatic ducts are incompletely visualized, appearing
decompressed centrally. Contrast passes into the duodenum.

IMPRESSION

Negative for retained common duct stone.

## 2012-12-18 ENCOUNTER — Ambulatory Visit (INDEPENDENT_AMBULATORY_CARE_PROVIDER_SITE_OTHER): Payer: Managed Care, Other (non HMO) | Admitting: Internal Medicine

## 2012-12-18 VITALS — BP 162/60 | HR 87 | Temp 98.7°F | Resp 16 | Ht 64.0 in | Wt 183.0 lb

## 2012-12-18 DIAGNOSIS — R109 Unspecified abdominal pain: Secondary | ICD-10-CM

## 2012-12-18 DIAGNOSIS — R1031 Right lower quadrant pain: Secondary | ICD-10-CM

## 2012-12-18 LAB — POCT UA - MICROSCOPIC ONLY
Casts, Ur, LPF, POC: NEGATIVE
Crystals, Ur, HPF, POC: NEGATIVE
Yeast, UA: NEGATIVE

## 2012-12-18 LAB — POCT CBC
Granulocyte percent: 64.3 %G (ref 37–80)
HCT, POC: 50.3 % — AB (ref 37.7–47.9)
Hemoglobin: 15.7 g/dL (ref 12.2–16.2)
Lymph, poc: 4.8 — AB (ref 0.6–3.4)
MCH, POC: 31.7 pg — AB (ref 27–31.2)
MCHC: 31.2 g/dL — AB (ref 31.8–35.4)
MCV: 101.7 fL — AB (ref 80–97)
MID (cbc): 1.1 — AB (ref 0–0.9)
MPV: 9.1 fL (ref 0–99.8)
POC Granulocyte: 10.7 — AB (ref 2–6.9)
POC LYMPH PERCENT: 29.2 %L (ref 10–50)
POC MID %: 6.5 %M (ref 0–12)
Platelet Count, POC: 247 10*3/uL (ref 142–424)
RBC: 4.95 M/uL (ref 4.04–5.48)
RDW, POC: 13.8 %
WBC: 16.6 10*3/uL — AB (ref 4.6–10.2)

## 2012-12-18 LAB — POCT URINALYSIS DIPSTICK
Bilirubin, UA: NEGATIVE
Blood, UA: NEGATIVE
Glucose, UA: 500
Leukocytes, UA: NEGATIVE
Nitrite, UA: NEGATIVE
Protein, UA: 100
Spec Grav, UA: 1.02
Urobilinogen, UA: 1
pH, UA: 7

## 2012-12-18 MED ORDER — HYDROCODONE-ACETAMINOPHEN 5-325 MG PO TABS: 1.0000 | ORAL_TABLET | Freq: Four times a day (QID) | ORAL | Status: DC | PRN

## 2012-12-18 MED ORDER — ONDANSETRON HCL 8 MG PO TABS: 8.0000 mg | ORAL_TABLET | Freq: Three times a day (TID) | ORAL | Status: DC | PRN

## 2012-12-18 NOTE — Progress Notes (Addendum)
Subjective:    Patient ID: Maureen [name], female    DOB: 01-Apr-1970, 42 y.o.   MRN: [id number]  This chart was scribed for Ellamae Sia, MD by Greggory Stallion, Medical Scribe. This patient's care was started at 5:41 PM.  HPI HPI Comments: Maureen [name] is a 42 y.o. female with history of chronic abdominal problems, who presents to the office complaining of gradual onset, intermittent lower back pain that radiates around to her right LQ and groin, that started 2 weeks ago. The pain is brought on randomly. Comes and goes. Pt has had nausea,no vomiting and ?low grade fevers. She states she was here 11/18 for back pain and a UA was done and she was told she had a kidney stone. 11/24 had er visit for same symptoms- ultrasound  did not rule out kidney stones so she was referred to a urologist. Denies emesis, constipation, diarrhea, hematuria tho has nausea lasst few days.  No constip or diarrhea.   Chart review details extensive hx abd pain of unclear etio even after multiple surgeries. Pt has had an appendectomy and cholecystectomy and laparoscopic adhesion removal in last year.  No dx of endometr ever made. Note persistent mild elev alk phos. Also persistent leucocytosis. She states her diabetes is not out of control but it is not perfect either.  Patient Active Problem List   Diagnosis Date Noted  . Chest pain 05/18/2012  . Endometriosis 12/08/2011  . Arthritis of left knee 12/08/2011  . Diabetic retinopathy 12/08/2011  . Fibromyalgia 10/23/2011  . DM type 1 (diabetes mellitus, type 1)---A1C 7 few weeks ago 10/13/2011   Past Medical History  Diagnosis Date  . Diabetes mellitus   . Fibromyalgia   . Hyperlipidemia   . Abdominal pain   . Nausea & vomiting   . Biliary dyskinesia   . Depression   . Joint pain   . Skin rash     due to medications  . Post traumatic stress disorder (PTSD)   . Sleep trouble   . Major depressive disorder   . Cervical strain   . Closed head  injury 2010  . Renal stones   . Migraine     hx of none recent  . Arthritis    Past Surgical History  Procedure Laterality Date  . Lipoma removed  yrs ago  . Knee surgery Left 2012  . Cesarean section  1997, 1999  . Cholecystectomy  02/10/2011    Procedure: LAPAROSCOPIC CHOLECYSTECTOMY WITH INTRAOPERATIVE CHOLANGIOGRAM;  Surgeon: Rulon Abide, DO;  Location: [facility] OR;  Service: General;  Laterality: N/A;  . Robotic assisted laparoscopic lysis of adhesion N/A 03/04/2012    Procedure: ROBOTIC ASSISTED LAPAROSCOPIC LYSIS OF EXTENSIVE ADHESIONS;  Surgeon: Tresa Endo A. Ernestina Penna, MD;  Location: [facility] ORS;  Service: Gynecology;  Laterality: N/A;  . Tonsillectomy  2001  . Abdominal hysterectomy  2001  . Eye surgery  2006    laser eye surgery left eye  . Exploratory laparomty  Mar 04, 2012  . Laparoscopic appendectomy N/A 10/12/2012    Procedure: DIAGNOSTIC APPENDECTOMY LAPAROSCOPIC;  Surgeon: Lodema Pilot, DO;  Location: [facility] ORS;  Service: General;  Laterality: N/A;  . Appendectomy     Allergies  Allergen Reactions  . Meloxicam Nausea Only  . Cephalexin Rash  . Ibuprofen Nausea And Vomiting and Rash   Prior to Admission medications   Medication Sig Start Date End Date Taking? Authorizing Provider  aspirin EC 81 MG tablet Take 81 mg by mouth daily.  Yes Historical Provider, MD  atorvastatin (LIPITOR) 40 MG tablet Take 1 tablet (40 mg total) by mouth daily. 12/01/12  Yes Peyton Najjar, MD  HYDROcodone-acetaminophen (NORCO/VICODIN) 5-325 MG per tablet Take 1 tablet by mouth every 6 (six) hours as needed. 12/12/12  Yes Gerhard Munch, MD  insulin aspart (NOVOLOG) 100 UNIT/ML injection Inject 8 Units into the skin 3 (three) times daily with meals.   Yes Historical Provider, MD  insulin glargine (LANTUS) 100 UNIT/ML injection Inject 28 Units into the skin every evening.   Yes Historical Provider, MD   History   Social History  . Marital Status: Married    Spouse Name: Tamlyn Sides     Number of Children: 2  . Years of Education: 12+   Occupational History  . Student-[employer]     [employer]   Social History Main Topics  . Smoking status: Never Smoker   . Smokeless tobacco: Never Used  . Alcohol Use: No  . Drug Use: No  . Sexual Activity: Yes    Partners: Male    Birth Control/ Protection: Surgical   Other Topics Concern  . Not on file   Social History Narrative   Lives with husband and two sons.  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  Stop working when she was diagnosed with endometriosis and had a TIA.    Review of Systems  Constitutional: Positive for fever.  Gastrointestinal: Positive for nausea. Negative for vomiting, diarrhea and constipation.  Genitourinary: Positive for flank pain and pelvic pain. Negative for hematuria.  Musculoskeletal: Positive for back pain.       Objective:   Physical Exam  Vitals reviewed. Constitutional: She is oriented to person, place, and time. She appears well-developed and well-nourished. No distress.  HENT:  Head: Normocephalic and atraumatic.  Eyes: Conjunctivae and EOM are normal. Pupils are equal, round, and reactive to light.  Neck: Normal range of motion. Neck supple. No thyromegaly present.  Cardiovascular: Normal rate, regular rhythm and normal heart sounds.   No murmur heard. Pulmonary/Chest: Effort normal and breath sounds normal. No respiratory distress. She has no wheezes. She has no rales.  Abdominal: There is no rebound and no guarding.  Generally tender in RUQ, RLQ, and LLQ with no rebound or guarding. No masses or organomegaly. Tender on right flank to palpation. Tender along right costal margin.   Musculoskeletal: Normal range of motion. She exhibits no edema.  Hip ROM normal without pain. Straight leg raise to 90 degrees intact without pain.   Lymphadenopathy:    She has no cervical adenopathy.  Neurological: She is alert and oriented to person, place,  and time.  Skin: Skin is warm and dry.  Psychiatric: She has a normal mood and affect. Her behavior is normal.    Filed Vitals:   12/18/12 1719  BP: 162/60  Pulse: 87  Temp: 98.7 F (37.1 C)  TempSrc: Oral  Resp: 16  Height: 5\' 4"  (1.626 m)  Weight: 183 lb (83.008 kg)  SpO2: 98%       Assessment & Plan:  6:06 PM-Discussed treatment plan which includes lab work and Zofran with pt at bedside and pt agreed to plan.  I have completed the patient encounter in its entirety as documented by the scribe, with editing by me where necessary. Jin Shockley P. Merla Riches, M.D.   Right flank pain - Plan: POCT UA - Microscopic Only, POCT urinalysis dipstick,  RLQ abdominal pain /chronic- Plan: POCT  CBC, Comprehensive metabolic panel, Sedimentation rate  Incr BP w/out dx HTN/increased pulse pressure--Needs repeat when pain free/?etio not obvious  Elevated alk phos(reck w/lfts,ca)   Will need GI eval-?inflamm bowel dz tho path of terminal ileum might have revealed this//?new adhesions CT abd/pelvis needed again if labs indicate need Hyperparathyroidism?  Results for orders placed in visit on 12/18/12  POCT UA - MICROSCOPIC ONLY      Result Value Range   WBC, Ur, HPF, POC 0-1     RBC, urine, microscopic 0-1     Bacteria, U Microscopic small     Mucus, UA small     Epithelial cells, urine per micros 3-6     Crystals, Ur, HPF, POC neg     Casts, Ur, LPF, POC neg     Yeast, UA neg     Amorphous small    POCT URINALYSIS DIPSTICK      Result Value Range   Color, UA yellow     Clarity, UA clear     Glucose, UA 500     Bilirubin, UA neg     Ketones, UA trace     Spec Grav, UA 1.020     Blood, UA neg     pH, UA 7.0     Protein, UA 100     Urobilinogen, UA 1.0     Nitrite, UA neg     Leukocytes, UA Negative    POCT CBC      Result Value Range   WBC 16.6 (*) 4.6 - 10.2 K/uL   Lymph, poc 4.8 (*) 0.6 - 3.4   POC LYMPH PERCENT 29.2  10 - 50 %L   MID (cbc) 1.1 (*) 0 - 0.9   POC MID %  6.5  0 - 12 %M   POC Granulocyte 10.7 (*) 2 - 6.9   Granulocyte percent 64.3  37 - 80 %G   RBC 4.95  4.04 - 5.48 M/uL   Hemoglobin 15.7  12.2 - 16.2 g/dL   HCT, POC 29.5 (*) 62.1 - 47.9 %   MCV 101.7 (*) 80 - 97 fL   MCH, POC 31.7 (*) 27 - 31.2 pg   MCHC 31.2 (*) 31.8 - 35.4 g/dL   RDW, POC 30.8     Platelet Count, POC 247  142 - 424 K/uL   MPV 9.1  0 - 99.8 fL

## 2012-12-19 ENCOUNTER — Ambulatory Visit (INDEPENDENT_AMBULATORY_CARE_PROVIDER_SITE_OTHER): Payer: Managed Care, Other (non HMO) | Admitting: Surgery

## 2012-12-19 LAB — COMPREHENSIVE METABOLIC PANEL
ALT: 36 U/L — ABNORMAL HIGH (ref 0–35)
AST: 36 U/L (ref 0–37)
Albumin: 4.6 g/dL (ref 3.5–5.2)
Alkaline Phosphatase: 143 U/L — ABNORMAL HIGH (ref 39–117)
BUN: 10 mg/dL (ref 6–23)
CO2: 29 mEq/L (ref 19–32)
Calcium: 10.1 mg/dL (ref 8.4–10.5)
Chloride: 103 mEq/L (ref 96–112)
Creat: 0.9 mg/dL (ref 0.50–1.10)
Glucose, Bld: 53 mg/dL — ABNORMAL LOW (ref 70–99)
Potassium: 3.9 mEq/L (ref 3.5–5.3)
Sodium: 141 mEq/L (ref 135–145)
Total Bilirubin: 0.5 mg/dL (ref 0.3–1.2)
Total Protein: 8.2 g/dL (ref 6.0–8.3)

## 2012-12-19 LAB — SEDIMENTATION RATE: Sed Rate: 28 mm/hr — ABNORMAL HIGH (ref 0–22)

## 2012-12-20 ENCOUNTER — Telehealth: Payer: Self-pay | Admitting: Internal Medicine

## 2012-12-20 DIAGNOSIS — R109 Unspecified abdominal pain: Secondary | ICD-10-CM

## 2012-12-20 NOTE — Addendum Note (Signed)
Addended byCaffie Damme on: 12/20/2012 01:45 PM   Modules accepted: Orders

## 2012-12-20 NOTE — Telephone Encounter (Signed)
See labs 

## 2012-12-20 NOTE — Telephone Encounter (Addendum)
Patient advised/ CT and referral orders put in.

## 2012-12-24 ENCOUNTER — Other Ambulatory Visit: Payer: Self-pay | Admitting: Internal Medicine

## 2012-12-27 ENCOUNTER — Ambulatory Visit
Admission: RE | Admit: 2012-12-27 | Discharge: 2012-12-27 | Disposition: A | Discharge status: Home / Self Care | Payer: Managed Care, Other (non HMO) | Source: Ambulatory Visit | Attending: Internal Medicine | Admitting: Internal Medicine

## 2012-12-27 ENCOUNTER — Encounter: Payer: Self-pay | Admitting: Gastroenterology

## 2012-12-27 DIAGNOSIS — R109 Unspecified abdominal pain: Secondary | ICD-10-CM

## 2012-12-27 MED ORDER — IOHEXOL 300 MG/ML  SOLN
100.0000 mL | Freq: Once | INTRAMUSCULAR | Status: AC | PRN
  Administered 2012-12-27: 100 mL via INTRAVENOUS

## 2012-12-28 ENCOUNTER — Telehealth: Payer: Self-pay | Admitting: Family Medicine

## 2012-12-28 MED ORDER — HYDROCODONE-ACETAMINOPHEN 5-325 MG PO TABS: 1.0000 | ORAL_TABLET | Freq: Four times a day (QID) | ORAL | Status: DC | PRN

## 2012-12-28 NOTE — Telephone Encounter (Signed)
Yes patient needs renewal on Hydrocodone, please advise.

## 2012-12-29 ENCOUNTER — Encounter: Payer: Self-pay | Admitting: Gastroenterology

## 2012-12-29 ENCOUNTER — Ambulatory Visit (INDEPENDENT_AMBULATORY_CARE_PROVIDER_SITE_OTHER): Payer: Managed Care, Other (non HMO) | Admitting: Gastroenterology

## 2012-12-29 VITALS — BP 100/60 | HR 78 | Ht 63.5 in | Wt 186.0 lb

## 2012-12-29 DIAGNOSIS — E119 Type 2 diabetes mellitus without complications: Secondary | ICD-10-CM

## 2012-12-29 DIAGNOSIS — Z9889 Other specified postprocedural states: Secondary | ICD-10-CM

## 2012-12-29 DIAGNOSIS — Z9089 Acquired absence of other organs: Secondary | ICD-10-CM

## 2012-12-29 DIAGNOSIS — K7689 Other specified diseases of liver: Secondary | ICD-10-CM

## 2012-12-29 DIAGNOSIS — G894 Chronic pain syndrome: Secondary | ICD-10-CM

## 2012-12-29 DIAGNOSIS — Z9049 Acquired absence of other specified parts of digestive tract: Secondary | ICD-10-CM

## 2012-12-29 DIAGNOSIS — K565 Intestinal adhesions [bands], unspecified as to partial versus complete obstruction: Secondary | ICD-10-CM

## 2012-12-29 DIAGNOSIS — K760 Fatty (change of) liver, not elsewhere classified: Secondary | ICD-10-CM

## 2012-12-29 DIAGNOSIS — Z90710 Acquired absence of both cervix and uterus: Secondary | ICD-10-CM

## 2012-12-29 DIAGNOSIS — R109 Unspecified abdominal pain: Secondary | ICD-10-CM

## 2012-12-29 NOTE — Progress Notes (Signed)
History of Present Illness:  This is a very complex and very nice 42 year old African American female who has multiple abdominal and pelvic adhesions with multiple abdominal and pelvic surgeries over the last several years with cholecystectomy, appendectomy, and gynecologic mesh implantation within the last year.  She has vague abdominal pain but no nausea vomiting, reflux symptoms, dysphagia, hepatobiliary complaints, melena, hematochezia, or bowel dysfunction.  She is a patient of Dr. Jeani Hawking who has followed her for her abnormal liver function tests, and apparently did liver biopsy in February of 2013.  Patient is not sure why she has not been back to see Dr. Elnoria Howard.  To me, she denies any hepatobiliary complaints but relates her liver function tests have recently been abnormal although I cannot find these in her records.  Last liver enzymes I can find from a month ago essentially normal with an alkaline phosphatase of 143 an SGOT of 36 but normal SGPT.  Also recent CT scan of the abdomen within the last several weeks has been entirely normal.  Patient does have well-controlled insulin-dependent diabetes.  She also within the last 48 hours has been to a pain clinic when new medications have been prescribed for her chronic pain syndrome.  Besides her insulin she is on daily aspirin, Lipitor, and when necessary oxycodone.   I have reviewed this patient's present history, medical and surgical past history, allergies and medications.     ROS:   All systems were reviewed and are negative unless otherwise stated in the HPI.  Allergies  Allergen Reactions  . Meloxicam Nausea Only  . Cephalexin Rash  . Ibuprofen Nausea And Vomiting and Rash   Outpatient Prescriptions Prior to Visit  Medication Sig Dispense Refill  . aspirin EC 81 MG tablet Take 81 mg by mouth daily.      Marland Kitchen atorvastatin (LIPITOR) 40 MG tablet Take 1 tablet (40 mg total) by mouth daily.  90 tablet  3  . HYDROcodone-acetaminophen  (NORCO) 5-325 MG per tablet Take 1 tablet by mouth every 6 (six) hours as needed.  30 tablet  0  . insulin aspart (NOVOLOG) 100 UNIT/ML injection Inject 8 Units into the skin 3 (three) times daily with meals.      . insulin glargine (LANTUS) 100 UNIT/ML injection Inject 28 Units into the skin every evening.      . ondansetron (ZOFRAN) 8 MG tablet Take 1 tablet (8 mg total) by mouth every 8 (eight) hours as needed for nausea or vomiting.  20 tablet  0   No facility-administered medications prior to visit.   Past Medical History  Diagnosis Date  . Diabetes mellitus   . Fibromyalgia   . Hyperlipidemia   . Abdominal pain   . Nausea & vomiting   . Biliary dyskinesia   . Depression   . Joint pain   . Skin rash     due to medications  . Post traumatic stress disorder (PTSD)   . Sleep trouble   . Major depressive disorder   . Cervical strain   . Closed head injury 2010  . Renal stones   . Migraine     hx of none recent  . Arthritis    Past Surgical History  Procedure Laterality Date  . Lipoma removed  yrs ago  . Knee surgery Left 2012  . Cesarean section  1997, 1999  . Cholecystectomy  02/10/2011    Procedure: LAPAROSCOPIC CHOLECYSTECTOMY WITH INTRAOPERATIVE CHOLANGIOGRAM;  Surgeon: Rulon Abide, DO;  Location: [facility];  Service: General;  Laterality: N/A;  . Robotic assisted laparoscopic lysis of adhesion N/A 03/04/2012    Procedure: ROBOTIC ASSISTED LAPAROSCOPIC LYSIS OF EXTENSIVE ADHESIONS;  Surgeon: Tresa Endo A. Ernestina Penna, MD;  Location: [facility];  Service: Gynecology;  Laterality: N/A;  . Tonsillectomy  2001  . Abdominal hysterectomy  2001  . Eye surgery  2006    laser eye surgery left eye  . Exploratory laparomty  Mar 04, 2012  . Laparoscopic appendectomy N/A 10/12/2012    Procedure: DIAGNOSTIC APPENDECTOMY LAPAROSCOPIC;  Surgeon: Lodema Pilot, DO;  Location: [facility];  Service: General;  Laterality: N/A;  . Appendectomy     History   Social History  . Marital Status: Married     Spouse Name: Avila Albritton    Number of Children: 2  . Years of Education: 12+   Occupational History  . Student-[employer]     [employer]   Social History Main Topics  . Smoking status: Never Smoker   . Smokeless tobacco: Never Used  . Alcohol Use: No  . Drug Use: No  . Sexual Activity: Yes    Partners: Male    Birth Control/ Protection: Surgical   Other Topics Concern  . None   Social History Narrative   Lives with husband and two sons.  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  Stop working when she was diagnosed with endometriosis and had a TIA.   Family History  Problem Relation Age of Onset  . Cancer Father     lymphoma  . Nephrolithiasis Father   . Vascular Disease Father   . Cancer Maternal Grandmother     colon  . Hypertension Mother   . Heart disease Mother   . Cataracts Mother   . Diabetes Brother   . Mental illness Maternal Grandfather   . Cancer Maternal Grandfather   . Heart disease Paternal Grandmother   . Heart disease Paternal Grandfather        Physical Exam: General well developed well nourished patient in no acute distress, appearing their stated age Eyes PERRLA, no icterus, fundoscopic exam per opthamologist Skin no lesions noted Neck supple, no adenopathy, no thyroid enlargement, no tenderness Chest clear to percussion and auscultation Heart no significant murmurs, gallops or rubs noted Abdomen no hepatosplenomegaly masses or tenderness, BS normal.  Extremities no acute joint lesions, edema, phlebitis or evidence of cellulitis. Neurologic patient oriented x 3, cranial nerves intact, no focal neurologic deficits noted. Psychological mental status normal and normal affect.  Assessment and plan: Insulin-dependent diabetic who has had multiple abdominal and pelvic surgeries and suffers from intestinal adhesions with chronic pain syndrome.  I've advised her to try to avoid surgery is much as possible  and to continue with normal bowel movement patterns.  Also I've advised her to follow recommendations from pain clinic as closely as possible.  I have referred her back to Dr. Jeani Hawking at her request for evaluation of  mildly abnormal liver function tests which are probably reactionary in nature.  She certainly has no evidence of chronic liver disease or highly elevated liver function test at this time.  Her liver enzymes are probably result of use of Lipitor, and apparently previous liver biopsy previously suggested fatty liver without evidence of autoimmune disease.  Again, I think it is best to Dr. Elnoria Howard continue as her gastroenterologist since I am retiring next month.  I reviewed all her medications and issues with her at length, she seems  pleased with her appointment.  Hopefully in the future she will do well and not require repeat multiple surgeries.  She apparently had negative endoscopy and colonoscopy and probably within the last 5 years.  I do not think these need to be repeated at this time.   CC Dr. Ellamae Sia and Dr. Jeani Hawking

## 2013-01-03 DIAGNOSIS — Z0271 Encounter for disability determination: Secondary | ICD-10-CM

## 2013-01-19 ENCOUNTER — Telehealth: Payer: Self-pay

## 2013-01-19 DIAGNOSIS — R109 Unspecified abdominal pain: Secondary | ICD-10-CM

## 2013-01-19 NOTE — Telephone Encounter (Signed)
Patient would like to get a refill on her pain medication.  She also wanted to touch base with the referral to GI.  Advised patient I would send request for meds to Dr. Laney Pastor and note to referrals about GI.  Patient states understanding.

## 2013-01-21 MED ORDER — DICYCLOMINE HCL 20 MG PO TABS: 20.0000 mg | ORAL_TABLET | Freq: Four times a day (QID) | ORAL | Status: DC

## 2013-01-21 NOTE — Telephone Encounter (Signed)
Ref Benson Norway Change pain meds to dicyclomine as chronic use hydrocodone is a problem for the liver  Meds ordered this encounter  Medications  . dicyclomine (BENTYL) 20 MG tablet    Sig: Take 1 tablet (20 mg total) by mouth every 6 (six) hours. Prn pain    Dispense:  30 tablet    Refill:  2

## 2013-01-23 NOTE — Telephone Encounter (Signed)
Called patient., to advise. Left message for call back.

## 2013-01-23 NOTE — Telephone Encounter (Signed)
Patient advised of referral. She will call and make appt.

## 2013-02-14 ENCOUNTER — Ambulatory Visit (INDEPENDENT_AMBULATORY_CARE_PROVIDER_SITE_OTHER): Payer: 59 | Admitting: Family Medicine

## 2013-02-14 VITALS — BP 150/80 | HR 80 | Temp 99.0°F | Resp 18 | Wt 183.0 lb

## 2013-02-14 DIAGNOSIS — R1011 Right upper quadrant pain: Secondary | ICD-10-CM

## 2013-02-14 DIAGNOSIS — G8929 Other chronic pain: Secondary | ICD-10-CM

## 2013-02-14 DIAGNOSIS — R945 Abnormal results of liver function studies: Secondary | ICD-10-CM

## 2013-02-14 MED ORDER — TRAMADOL HCL 50 MG PO TABS: 50.0000 mg | ORAL_TABLET | Freq: Three times a day (TID) | ORAL | Status: DC | PRN

## 2013-02-14 NOTE — Progress Notes (Signed)
This is a very complex and very nice 43 year old African American female who has multiple abdominal and pelvic adhesions with multiple abdominal and pelvic surgeries over the last several years with cholecystectomy, appendectomy, and gynecologic mesh implantation within the last year. She has vague abdominal pain but no nausea vomiting, reflux symptoms, dysphagia, hepatobiliary complaints, melena, hematochezia, or bowel dysfunction. She is a patient of Dr. Carol Ada who has followed her for her abnormal liver function tests, and apparently did liver biopsy in February of 2013. Patient is not sure why she has not been back to see Dr. Benson Norway. To me, she denies any hepatobiliary complaints but relates her liver function tests have recently been abnormal although I cannot find these in her records. Last liver enzymes I can find from a month ago essentially normal with an alkaline phosphatase of 143 an SGOT of 36 but normal SGPT. Also recent CT scan of the abdomen within the last several weeks has been entirely normal. Patient does have well-controlled insulin-dependent diabetes. She also within the last 48 hours has been to a pain clinic when new medications have been prescribed for her chronic pain syndrome. Besides her insulin she is on daily aspirin, Lipitor, and when necessary oxycodone.  She's had lysis of adhesions by Dr. Valentino Saxon in the past and was told that she should expect chronic abdominal pain for the rest of her life. He's gone to pain clinics but she is reluctant to take anything for the pain because she feels there is something else going on.  The patient's pain is usually in the right upper quadrant and is almost daily lasting hours at a time. Radiates to her right scapula.  Objective: Patient is teary-eyed. Patient is wearing contact lenses which are slightly blue in shape. Fundi: Normal Oropharynx: Clear Chest: Clear Heart: Regular no murmur Abdomen: Mildly tender in the right upper  quadrant and epigastrium with no masses or HSM palpable. Skin: Unremarkable Joints: No swelling Extremities: No edema  Assessment: Frustrating problem for the patient with ongoing mild to moderate abdominal pain requiring multiple evaluations by gastroenterologist, emergency room physicians, and our clinic.  Patient has undergone CT scans, bone marrow biopsies, and repeated tests for pancreatitis and biliary disease. The only consistent problem is the pain and mild elevation of sedimentation rate and liver function tests.  If possible she has fatty liver disease. It's unlikely she has diabetic induced pain because her A1c's have been right on target.  Plan: We'll run a variety of tests that can cause chronic hepatitis or liver disease. If these are negative then she will need to be referred to a tertiary center.  [name], MD

## 2013-02-15 LAB — IBC PANEL
%SAT: 9 % — ABNORMAL LOW (ref 20–55)
TIBC: 264 ug/dL (ref 250–470)
UIBC: 239 ug/dL (ref 125–400)

## 2013-02-15 LAB — IRON: Iron: 25 ug/dL — ABNORMAL LOW (ref 42–145)

## 2013-02-15 LAB — SEDIMENTATION RATE: Sed Rate: 24 mm/hr — ABNORMAL HIGH (ref 0–22)

## 2013-02-16 LAB — CERULOPLASMIN: Ceruloplasmin: 40 mg/dL (ref 20–60)

## 2013-02-16 LAB — ANA: Anti Nuclear Antibody(ANA): NEGATIVE

## 2013-02-16 LAB — MITOCHONDRIAL ANTIBODIES: Mitochondrial M2 Ab, IgG: 0.45 (ref <0.91)

## 2013-03-07 ENCOUNTER — Ambulatory Visit (INDEPENDENT_AMBULATORY_CARE_PROVIDER_SITE_OTHER): Payer: 59 | Admitting: Emergency Medicine

## 2013-03-07 VITALS — BP 146/78 | HR 94 | Temp 99.0°F | Resp 18 | Ht 64.0 in | Wt 182.0 lb

## 2013-03-07 DIAGNOSIS — R109 Unspecified abdominal pain: Secondary | ICD-10-CM

## 2013-03-07 DIAGNOSIS — E109 Type 1 diabetes mellitus without complications: Secondary | ICD-10-CM

## 2013-03-07 NOTE — Progress Notes (Signed)
[facility] [address] [place] [address] [id number]  Date:  03/07/2013   Name:  Maureen [name]   DOB:  12-04-1970   MRN:  [id number]  PCP:  [name], MD    Chief Complaint: Abdominal Pain   History of Present Illness:  Maureen [name] is a 43 y.o. very pleasant female patient who presents with the following:  Here to discuss her referral to a tertiary center promised by Dr Joseph Art.  Is attempting to stay out of the ER which is laudable and is not interested in taking long term pain medications as she is concerned about being unable to care for her kids.  No nausea or vomiting.  No stool change.  No fever or chills.  Eating and sleeping. Denies other complaint or health concern today.   Patient Active Problem List   Diagnosis Date Noted  . Chest pain 05/18/2012  . Endometriosis 12/08/2011  . Arthritis of left knee 12/08/2011  . Diabetic retinopathy 12/08/2011  . Fibromyalgia 10/23/2011  . DM type 1 (diabetes mellitus, type 1) 10/13/2011    Past Medical History  Diagnosis Date  . Diabetes mellitus   . Fibromyalgia   . Hyperlipidemia   . Abdominal pain   . Nausea & vomiting   . Biliary dyskinesia   . Depression   . Joint pain   . Skin rash     due to medications  . Post traumatic stress disorder (PTSD)   . Sleep trouble   . Major depressive disorder   . Cervical strain   . Closed head injury 2010  . Renal stones   . Migraine     hx of none recent  . Arthritis     Past Surgical History  Procedure Laterality Date  . Lipoma removed  yrs ago  . Knee surgery Left 2012  . Cesarean section  1997, 1999  . Cholecystectomy  02/10/2011    Procedure: LAPAROSCOPIC CHOLECYSTECTOMY WITH INTRAOPERATIVE CHOLANGIOGRAM;  Surgeon: Judieth Keens, DO;  Location: [facility];  Service: General;  Laterality: N/A;  . Robotic assisted laparoscopic lysis of adhesion N/A 03/04/2012    Procedure: ROBOTIC ASSISTED LAPAROSCOPIC LYSIS OF EXTENSIVE  ADHESIONS;  Surgeon: Claiborne Billings A. Pamala Hurry, MD;  Location: [facility];  Service: Gynecology;  Laterality: N/A;  . Tonsillectomy  2001  . Abdominal hysterectomy  2001  . Eye surgery  2006    laser eye surgery left eye  . Exploratory laparomty  Mar 04, 2012  . Laparoscopic appendectomy N/A 10/12/2012    Procedure: DIAGNOSTIC APPENDECTOMY LAPAROSCOPIC;  Surgeon: Madilyn Hook, DO;  Location: [facility];  Service: General;  Laterality: N/A;  . Appendectomy      History  Substance Use Topics  . Smoking status: Never Smoker   . Smokeless tobacco: Never Used  . Alcohol Use: No    Family History  Problem Relation Age of Onset  . Cancer Father     lymphoma  . Nephrolithiasis Father   . Vascular Disease Father   . Cancer Maternal Grandmother     colon  . Hypertension Mother   . Heart disease Mother   . Cataracts Mother   . Diabetes Brother   . Mental illness Maternal Grandfather   . Cancer Maternal Grandfather   . Heart disease Paternal Grandmother   . Heart disease Paternal Grandfather     Allergies  Allergen Reactions  . Meloxicam Nausea Only  . Cephalexin Rash  . Ibuprofen Nausea And Vomiting and Rash  Medication list has been reviewed and updated.  Current Outpatient Prescriptions on File Prior to Visit  Medication Sig Dispense Refill  . aspirin EC 81 MG tablet Take 81 mg by mouth daily.      Marland Kitchen atorvastatin (LIPITOR) 40 MG tablet Take 1 tablet (40 mg total) by mouth daily.  90 tablet  3  . dicyclomine (BENTYL) 20 MG tablet Take 1 tablet (20 mg total) by mouth every 6 (six) hours. Prn pain  30 tablet  2  . insulin aspart (NOVOLOG) 100 UNIT/ML injection Inject 8 Units into the skin 3 (three) times daily with meals.      . insulin glargine (LANTUS) 100 UNIT/ML injection Inject 28 Units into the skin every evening.       No current facility-administered medications on file prior to visit.    Review of Systems:  As per HPI, otherwise negative.    Physical Examination: Filed  Vitals:   03/07/13 1430  BP: 146/78  Pulse: 94  Temp: 99 F (37.2 C)  Resp: 18   Filed Vitals:   03/07/13 1430  Height: 5\' 4"  (1.626 m)  Weight: 182 lb (82.555 kg)   Body mass index is 31.22 kg/(m^2). Ideal Body Weight: Weight in (lb) to have BMI = 25: 145.3  GEN: WDWN, NAD, Non-toxic, A & O x 3 HEENT: Atraumatic, Normocephalic. Neck supple. No masses, No LAD. Ears and Nose: No external deformity. CV: RRR, No M/G/R. No JVD. No thrill. No extra heart sounds. PULM: CTA B, no wheezes, crackles, rhonchi. No retractions. No resp. distress. No accessory muscle use. ABD: S, NT, ND, +BS. No rebound. No HSM. EXTR: No c/c/e NEURO Normal gait.  PSYCH: Normally interactive. Conversant. Not depressed or anxious appearing.  Calm demeanor.    Assessment and Plan: To follow up by phone with Dr L regarding referrals and check back with her pain clinic doctor about her plans as  She does NOT understand what has been proposed. Signed,  Ellison Carwin, MD

## 2013-03-08 ENCOUNTER — Encounter: Payer: Self-pay | Admitting: Internal Medicine

## 2013-03-19 IMAGING — CR DG CHEST 1V PORT
1 series · 1 of 1 positions shown · non-contrast
Comparison: 12/23/2010; 11/17/2009

CLINICAL DATA: Chest pain, shortness of breath, nausea

PORTABLE CHEST - 1 VIEW

[AP]
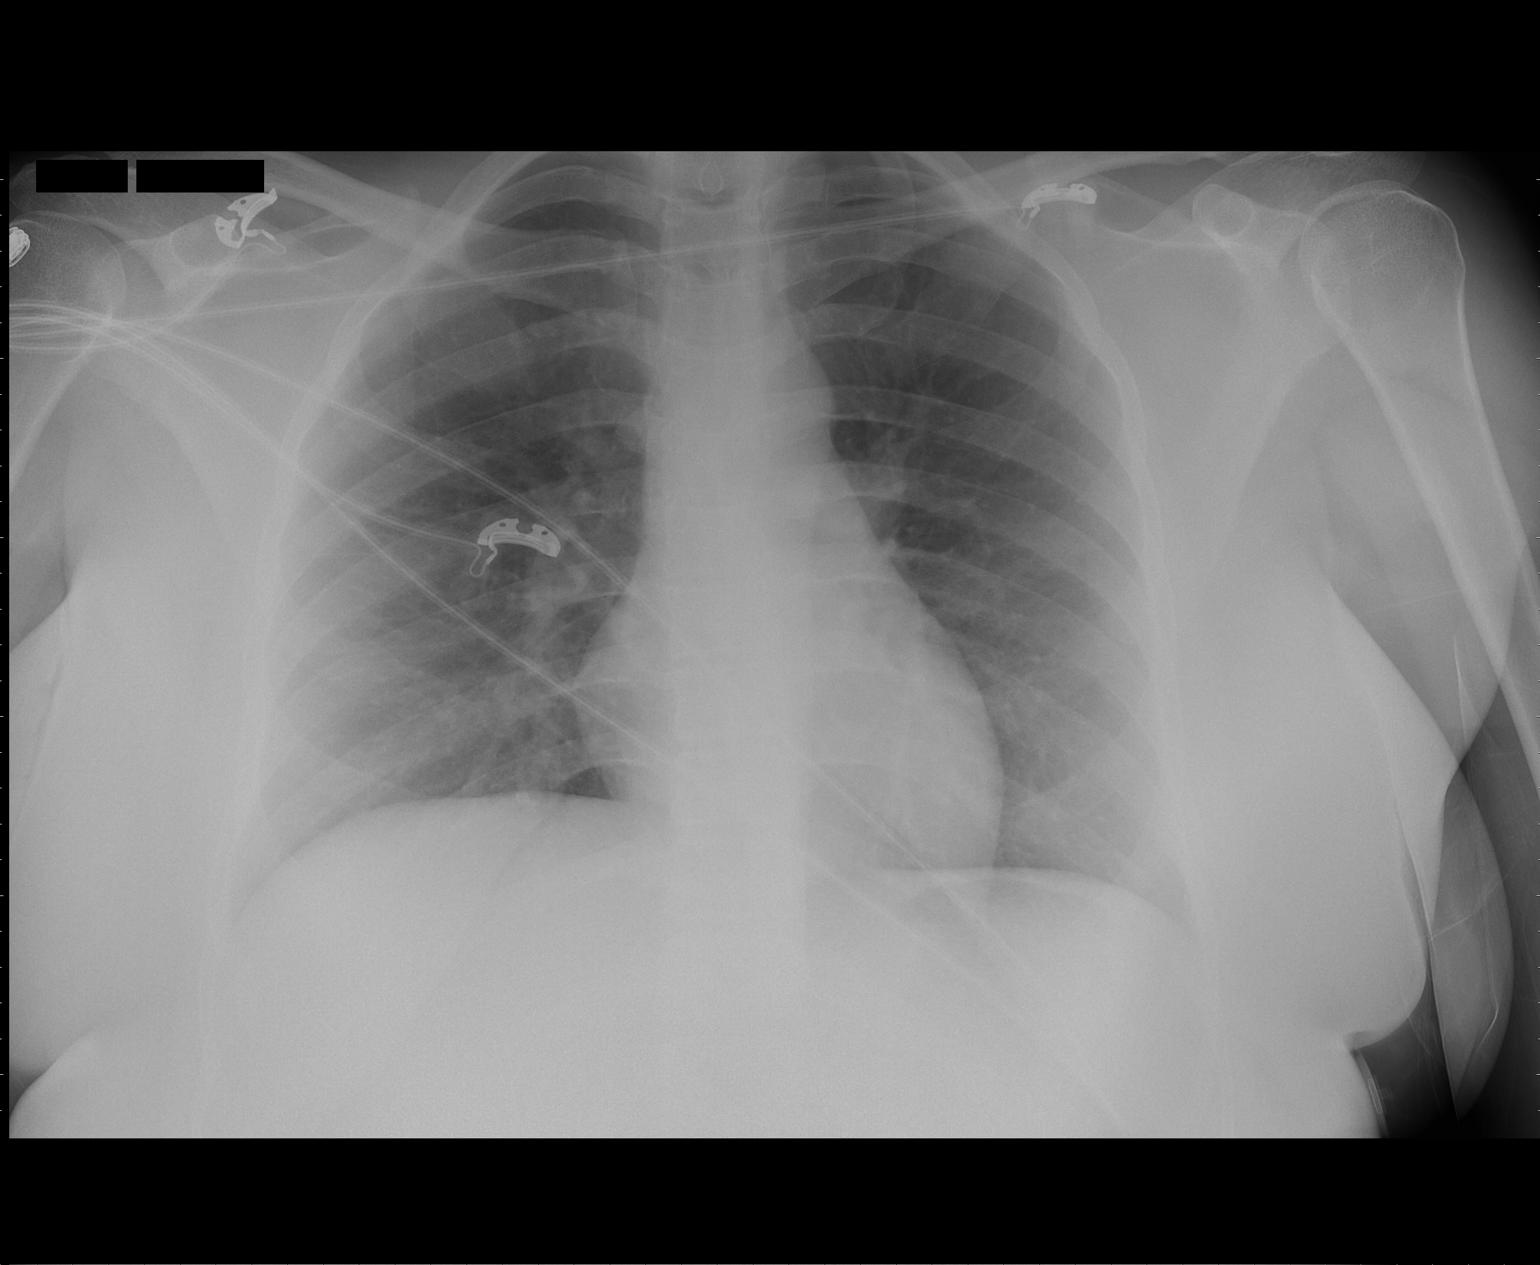

[1 of 1 positions shown; findings below may reference images not displayed]

FINDINGS: Grossly unchanged cardiac silhouette and mediastinal
contours.  No focal parenchymal opacity.  No definite pleural
effusion or pneumothorax.  Grossly unchanged bones.  Incidental
note is again made of a left-sided cervical rib.
IMPRESSION: No acute cardiopulmonary disease.

## 2013-04-14 ENCOUNTER — Emergency Department (HOSPITAL_COMMUNITY)
Admission: EM | Admit: 2013-04-14 | Discharge: 2013-04-14 | Disposition: A | Discharge status: Home / Self Care | Payer: 59 | Attending: Emergency Medicine | Admitting: Emergency Medicine

## 2013-04-14 ENCOUNTER — Encounter (HOSPITAL_COMMUNITY): Payer: Self-pay | Admitting: Emergency Medicine

## 2013-04-14 ENCOUNTER — Emergency Department (HOSPITAL_COMMUNITY): Payer: 59

## 2013-04-14 DIAGNOSIS — S59919A Unspecified injury of unspecified forearm, initial encounter: Principal | ICD-10-CM

## 2013-04-14 DIAGNOSIS — Y9389 Activity, other specified: Secondary | ICD-10-CM | POA: Insufficient documentation

## 2013-04-14 DIAGNOSIS — Z8659 Personal history of other mental and behavioral disorders: Secondary | ICD-10-CM | POA: Insufficient documentation

## 2013-04-14 DIAGNOSIS — Z794 Long term (current) use of insulin: Secondary | ICD-10-CM | POA: Insufficient documentation

## 2013-04-14 DIAGNOSIS — Z79899 Other long term (current) drug therapy: Secondary | ICD-10-CM | POA: Insufficient documentation

## 2013-04-14 DIAGNOSIS — Z872 Personal history of diseases of the skin and subcutaneous tissue: Secondary | ICD-10-CM | POA: Insufficient documentation

## 2013-04-14 DIAGNOSIS — Y9229 Other specified public building as the place of occurrence of the external cause: Secondary | ICD-10-CM | POA: Insufficient documentation

## 2013-04-14 DIAGNOSIS — S6990XA Unspecified injury of unspecified wrist, hand and finger(s), initial encounter: Principal | ICD-10-CM | POA: Insufficient documentation

## 2013-04-14 DIAGNOSIS — M25531 Pain in right wrist: Secondary | ICD-10-CM

## 2013-04-14 DIAGNOSIS — E119 Type 2 diabetes mellitus without complications: Secondary | ICD-10-CM | POA: Insufficient documentation

## 2013-04-14 DIAGNOSIS — Z7982 Long term (current) use of aspirin: Secondary | ICD-10-CM | POA: Insufficient documentation

## 2013-04-14 DIAGNOSIS — W2203XA Walked into furniture, initial encounter: Secondary | ICD-10-CM | POA: Insufficient documentation

## 2013-04-14 DIAGNOSIS — Z87442 Personal history of urinary calculi: Secondary | ICD-10-CM | POA: Insufficient documentation

## 2013-04-14 DIAGNOSIS — Z87828 Personal history of other (healed) physical injury and trauma: Secondary | ICD-10-CM | POA: Insufficient documentation

## 2013-04-14 DIAGNOSIS — M129 Arthropathy, unspecified: Secondary | ICD-10-CM | POA: Insufficient documentation

## 2013-04-14 DIAGNOSIS — E785 Hyperlipidemia, unspecified: Secondary | ICD-10-CM | POA: Insufficient documentation

## 2013-04-14 DIAGNOSIS — S59909A Unspecified injury of unspecified elbow, initial encounter: Secondary | ICD-10-CM | POA: Insufficient documentation

## 2013-04-14 MED ORDER — TRAMADOL HCL 50 MG PO TABS: 50.0000 mg | ORAL_TABLET | Freq: Four times a day (QID) | ORAL | Status: DC | PRN

## 2013-04-14 NOTE — Discharge Instructions (Signed)
Take the prescribed medication as directed for pain.  May continue icing and elevating wrist to help with pain. Follow-up with Dr. Caralyn Guile if symptoms worsen or no improvement within 1 week. Return to the ED for new or worsening symptoms.

## 2013-04-14 NOTE — ED Notes (Signed)
Patient transported to X-ray 

## 2013-04-14 NOTE — ED Notes (Signed)
Pt reports right medial wrist pain x 2 weeks. States she injured it though doesn't remember how she injured it. States she hit wrist against an object accidentally and re-injured wrist. Swelling to medial right wrist.

## 2013-04-14 NOTE — ED Provider Notes (Signed)
Medical screening examination/treatment/procedure(s) were performed by non-physician practitioner and as supervising physician I was immediately available for consultation/collaboration.   EKG Interpretation None        Blanchie Dessert, MD 04/14/13 [phone]

## 2013-04-14 NOTE — ED Notes (Signed)
Pt. reports right wrist pain for several days denies injury .

## 2013-04-14 NOTE — ED Provider Notes (Signed)
CSN: [id number]     Arrival date & time 04/14/13  0009 History   First MD Initiated Contact with Patient 04/14/13 0031     Chief Complaint  Patient presents with  . Wrist Pain     (Consider location/radiation/quality/duration/timing/severity/associated sxs/prior Treatment) Patient is a 43 y.o. female presenting with wrist pain. The history is provided by the patient and medical records.  Wrist Pain Associated symptoms include arthralgias.   This is a 43 y.o. F with PMH significant for fibromyalgia, DM, HLP, MDD, renal stones, presenting to the ED for right wrist pain.  Pt states she thinks she hit her wrist on a table a few days ago, had some pain but nothing unbearable.  States she was attending a school play tonight at her child's school and hit it again on a wooden chair, now with increased pain.  States she has broken this wrist in the past and was concerned for recurrent injury.  Pt denies numbness or paresthesias of hand or fingers.  Pt is right hand dominant.  No intervention tried PTA.  Past Medical History  Diagnosis Date  . Diabetes mellitus   . Fibromyalgia   . Hyperlipidemia   . Abdominal pain   . Nausea & vomiting   . Biliary dyskinesia   . Depression   . Joint pain   . Skin rash     due to medications  . Post traumatic stress disorder (PTSD)   . Sleep trouble   . Major depressive disorder   . Cervical strain   . Closed head injury 2010  . Renal stones   . Migraine     hx of none recent  . Arthritis    Past Surgical History  Procedure Laterality Date  . Lipoma removed  yrs ago  . Knee surgery Left 2012  . Cesarean section  1997, 1999  . Cholecystectomy  02/10/2011    Procedure: LAPAROSCOPIC CHOLECYSTECTOMY WITH INTRAOPERATIVE CHOLANGIOGRAM;  Surgeon: Judieth Keens, DO;  Location: [facility];  Service: General;  Laterality: N/A;  . Robotic assisted laparoscopic lysis of adhesion N/A 03/04/2012    Procedure: ROBOTIC ASSISTED LAPAROSCOPIC LYSIS OF EXTENSIVE  ADHESIONS;  Surgeon: Claiborne Billings A. Pamala Hurry, MD;  Location: [facility];  Service: Gynecology;  Laterality: N/A;  . Tonsillectomy  2001  . Abdominal hysterectomy  2001  . Eye surgery  2006    laser eye surgery left eye  . Exploratory laparomty  Mar 04, 2012  . Laparoscopic appendectomy N/A 10/12/2012    Procedure: DIAGNOSTIC APPENDECTOMY LAPAROSCOPIC;  Surgeon: Madilyn Hook, DO;  Location: [facility];  Service: General;  Laterality: N/A;  . Appendectomy     Family History  Problem Relation Age of Onset  . Cancer Father     lymphoma  . Nephrolithiasis Father   . Vascular Disease Father   . Cancer Maternal Grandmother     colon  . Hypertension Mother   . Heart disease Mother   . Cataracts Mother   . Diabetes Brother   . Mental illness Maternal Grandfather   . Cancer Maternal Grandfather   . Heart disease Paternal Grandmother   . Heart disease Paternal Grandfather    History  Substance Use Topics  . Smoking status: Never Smoker   . Smokeless tobacco: Never Used  . Alcohol Use: No   OB History   Grav Para Term Preterm Abortions TAB SAB Ect Mult Living                 Review of Systems  Musculoskeletal: Positive for arthralgias.  All other systems reviewed and are negative.   Allergies  Meloxicam; Cephalexin; and Ibuprofen  Home Medications   Current Outpatient Rx  Name  Route  Sig  Dispense  Refill  . acetaminophen (TYLENOL) 325 MG tablet   Oral   Take 650 mg by mouth every 6 (six) hours as needed for mild pain.         Marland Kitchen aspirin EC 81 MG tablet   Oral   Take 81 mg by mouth daily.         Marland Kitchen atorvastatin (LIPITOR) 40 MG tablet   Oral   Take 1 tablet (40 mg total) by mouth daily.   90 tablet   3   . dicyclomine (BENTYL) 20 MG tablet   Oral   Take 1 tablet (20 mg total) by mouth every 6 (six) hours. Prn pain   30 tablet   2   . insulin aspart (NOVOLOG) 100 UNIT/ML injection   Subcutaneous   Inject 8 Units into the skin 3 (three) times daily with meals.          . insulin glargine (LANTUS) 100 UNIT/ML injection   Subcutaneous   Inject 28 Units into the skin every evening.          BP 139/73  Pulse 88  Temp(Src) 98.2 F (36.8 C) (Oral)  Resp 16  Ht 5\' 4"  (1.626 m)  Wt 176 lb (79.833 kg)  BMI 30.20 kg/m2  SpO2 100%  Physical Exam  Nursing note and vitals reviewed. Constitutional: She is oriented to person, place, and time. She appears well-developed and well-nourished. No distress.  HENT:  Head: Normocephalic and atraumatic.  Mouth/Throat: Oropharynx is clear and moist.  Eyes: Conjunctivae and EOM are normal. Pupils are equal, round, and reactive to light.  Neck: Normal range of motion. Neck supple.  Cardiovascular: Normal rate, regular rhythm and normal heart sounds.   Pulmonary/Chest: Effort normal and breath sounds normal. No respiratory distress. She has no wheezes.  Musculoskeletal: Normal range of motion. She exhibits no edema.       Right wrist: She exhibits tenderness and bony tenderness. She exhibits normal range of motion, no swelling, no effusion, no crepitus, no deformity and no laceration.  Right wrist with TTP along volar radial aspect; no snuffbox tenderness; no swelling or gross deformity; limited flexion and extension due to pain; strong radial pulse and cap refill; sensation intact  Neurological: She is alert and oriented to person, place, and time.  Skin: Skin is warm and dry. She is not diaphoretic.  Psychiatric: She has a normal mood and affect.    ED Course  Procedures (including critical care time) Labs Review Labs Reviewed - No data to display Imaging Review Dg Wrist Complete Right  04/14/2013   CLINICAL DATA:  Pain in the right wrist.  EXAM: RIGHT WRIST - COMPLETE 3+ VIEW  COMPARISON:  No priors.  FINDINGS: Multiple views of the right wrist demonstrate no acute displaced fracture, subluxation, dislocation, or soft tissue abnormality.  IMPRESSION: No acute radiographic abnormality of the right wrist.    Electronically Signed   By: Vinnie Langton M.D.   On: 04/14/2013 01:54     EKG Interpretation None      MDM   Final diagnoses:  Wrist pain, right   X-ray negative for acute fracture or dislocation.  Wrist splint placed.  Pt will FU with hand surgery if symptoms worsen or no improvement in 1 week.  Tramadol for pain,  may continue icing and elevating wrist to help with pain.  Discussed plan with pt, she acknowledged understanding and agreed with plan of care.  Larene Pickett, PA-C 04/14/13 [phone]

## 2013-04-25 IMAGING — CR DG CHEST 2V
2 series · 2 of 2 positions shown · non-contrast
Comparison: Chest x-ray 05/17/2011.

CLINICAL DATA: The left posterior back pain.  Fever and shortness
of breath.

CHEST - 2 VIEW

[w chest pa]
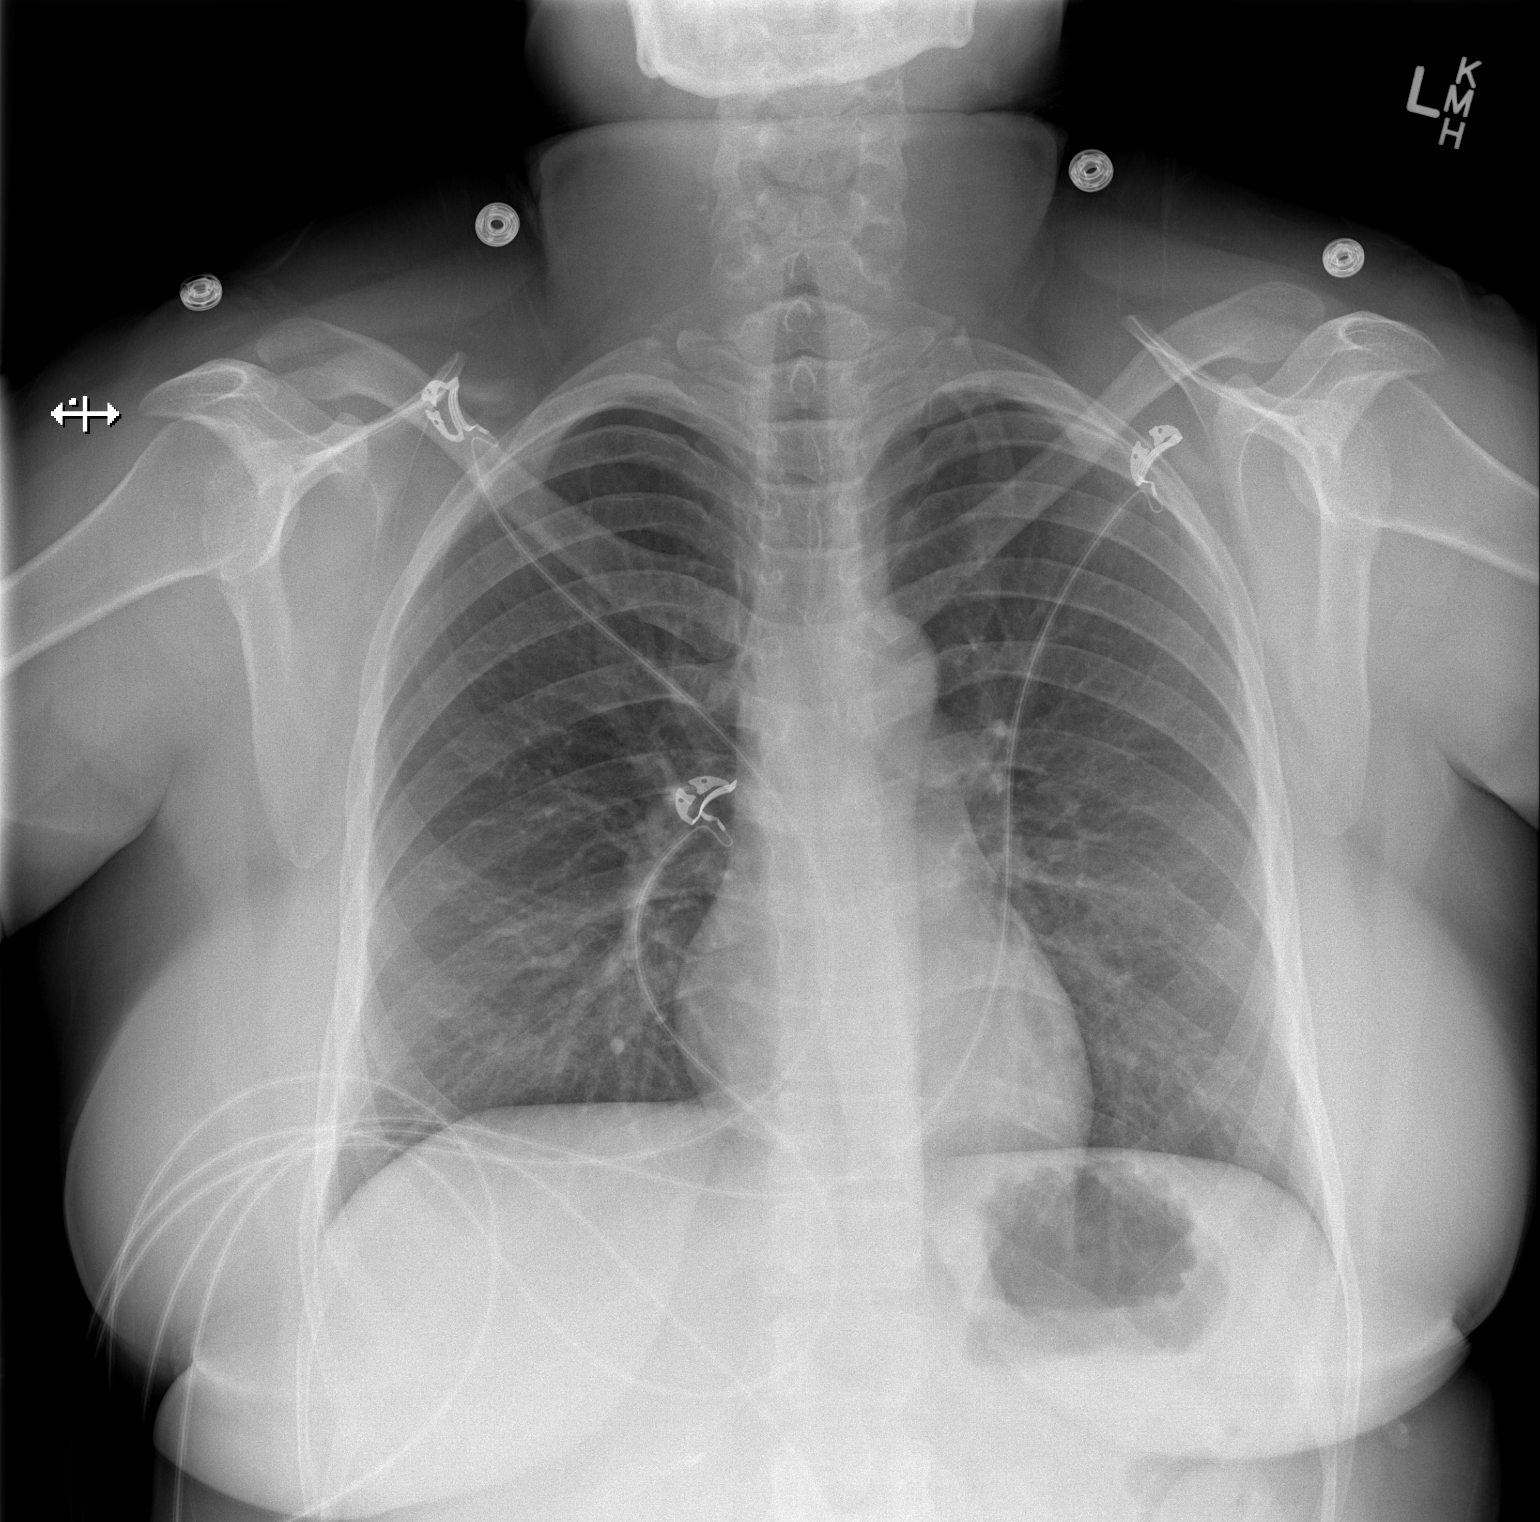

[w chest lat]
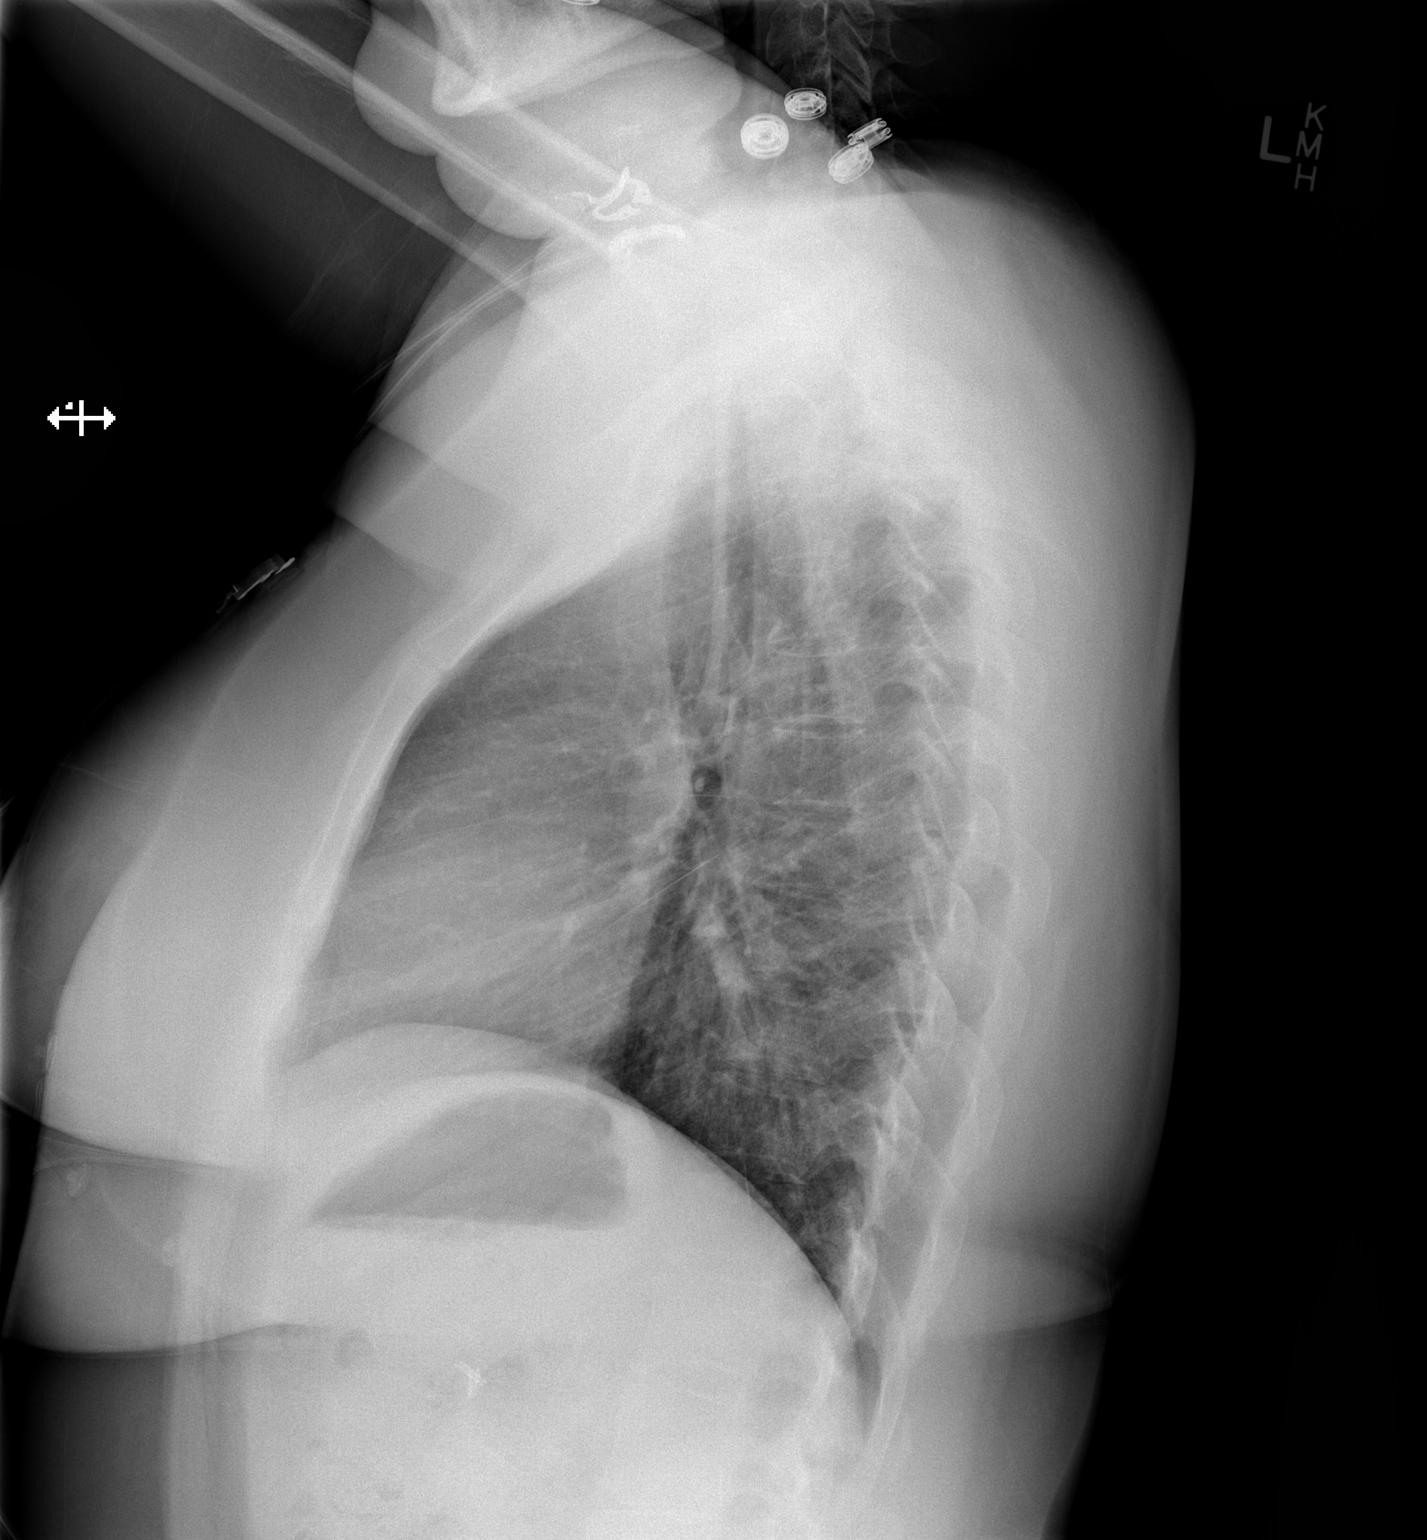

[2 of 2 positions shown; findings below may reference images not displayed]

FINDINGS: Lung volumes are normal.  No consolidative airspace
disease.  No pleural effusions.  No pneumothorax.  No pulmonary
nodule or mass noted.  Pulmonary vasculature and the
cardiomediastinal silhouette are within normal limits.  Surgical
clips project over the right upper quadrant of the abdomen, likely
from prior cholecystectomy.
IMPRESSION: 1. No radiographic evidence of acute cardiopulmonary disease.

## 2013-05-19 IMAGING — CT CT ABD-PELV W/O CM
1 series · 13 of 15 positions shown, 19 images · non-contrast
Comparison: 03/07/2007

CLINICAL DATA: Right flank pain.  Nausea and vomiting.  White cell
count 14.3.  Microhematuria.

CT ABDOMEN AND PELVIS WITHOUT CONTRAST
TECHNIQUE: Multidetector CT imaging of the abdomen and pelvis was
performed following the standard protocol without intravenous
contrast.

[Series 3: lung · axial · 0.63mm/px · z∈[-328,-268]mm · 13 of 15 slices shown, 19 images]
[im 2/15  soft-tissue]
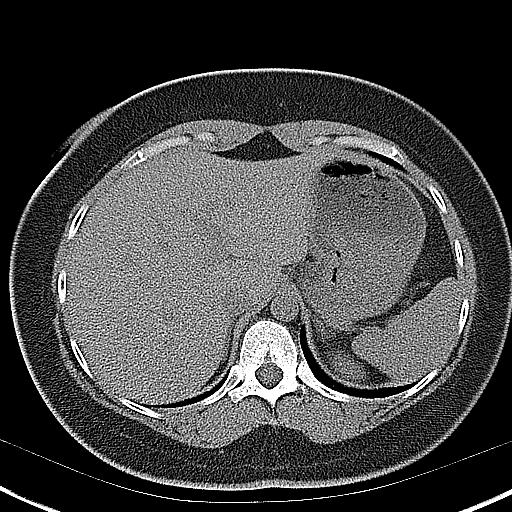
[im 2/15  bone]
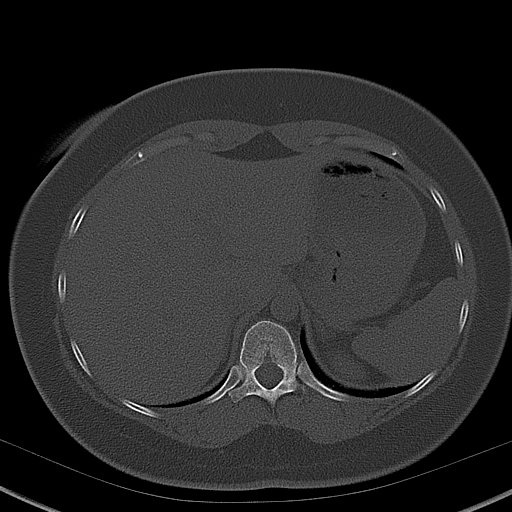
[im 3/15  soft-tissue]
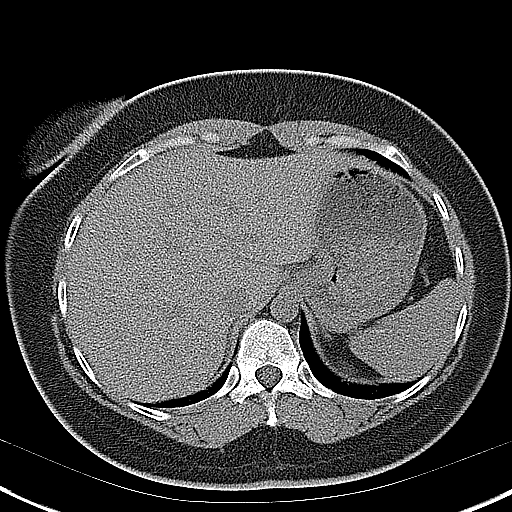
[im 4/15  soft-tissue]
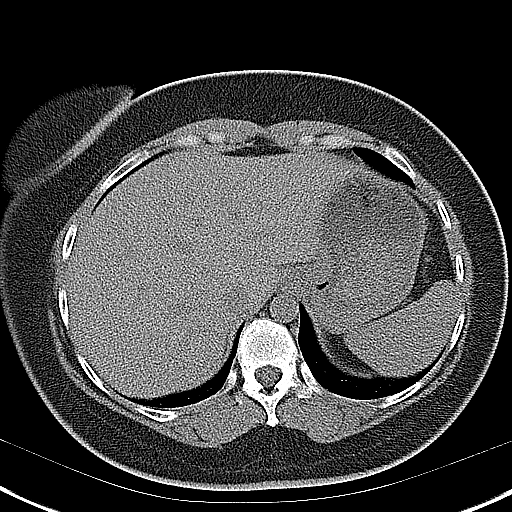
[im 5/15  soft-tissue]
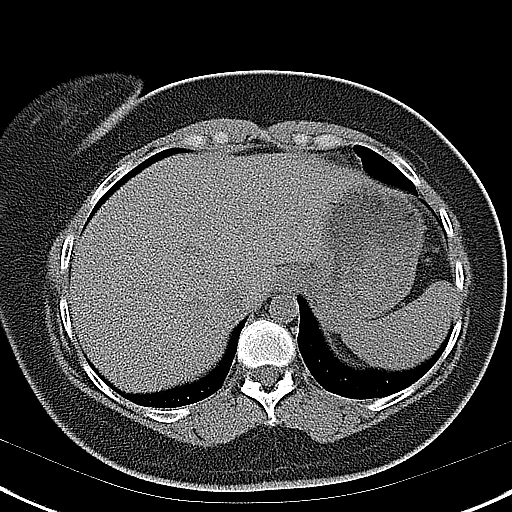
[im 6/15  soft-tissue]
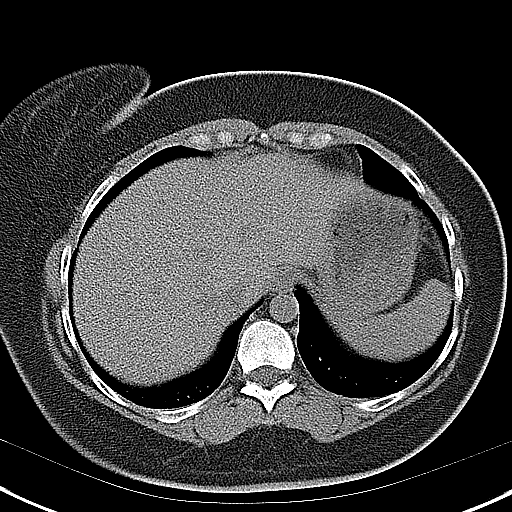
[im 7/15  soft-tissue]
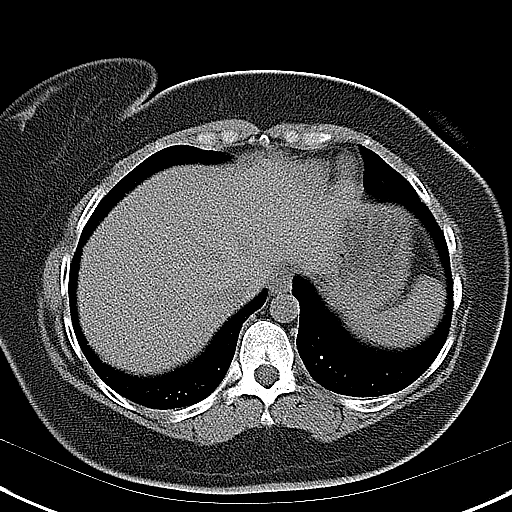
[im 8/15  soft-tissue]
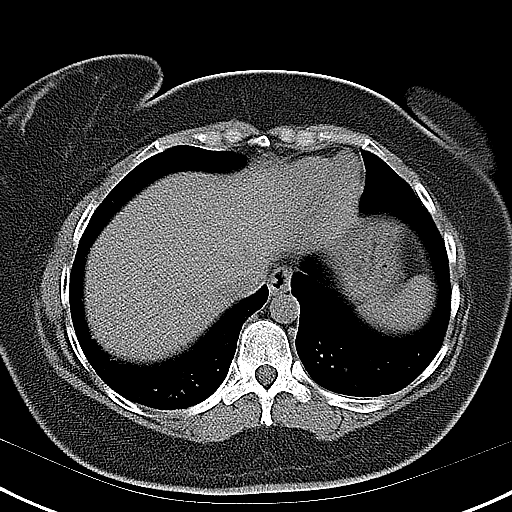
[im 9/15  soft-tissue]
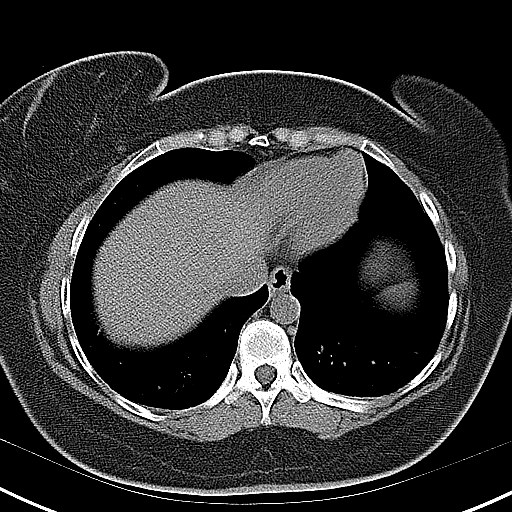
[im 10/15  soft-tissue]
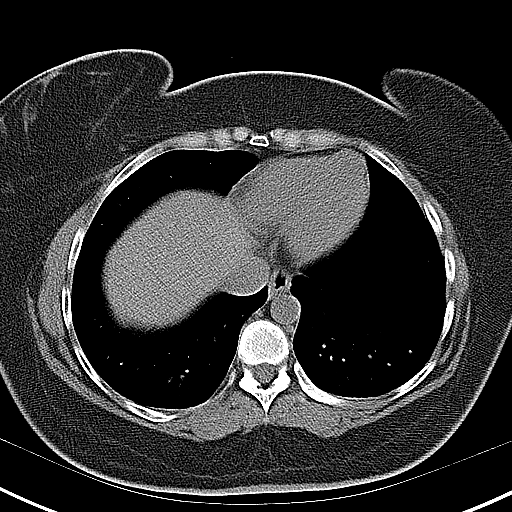
[im 10/15  bone]
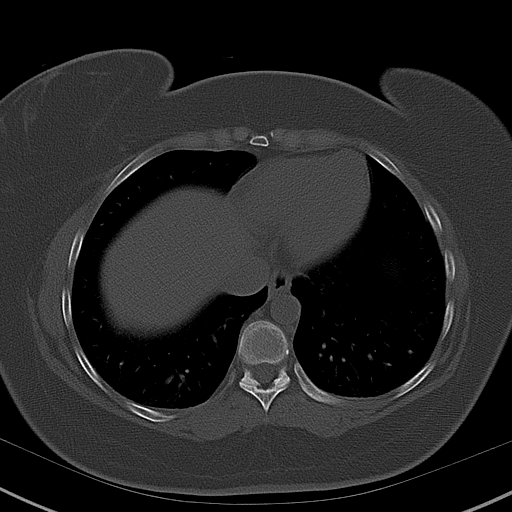
[im 11/15  soft-tissue]
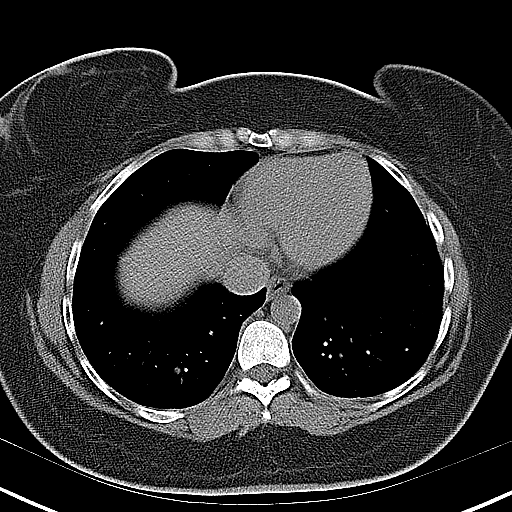
[im 11/15  lung]
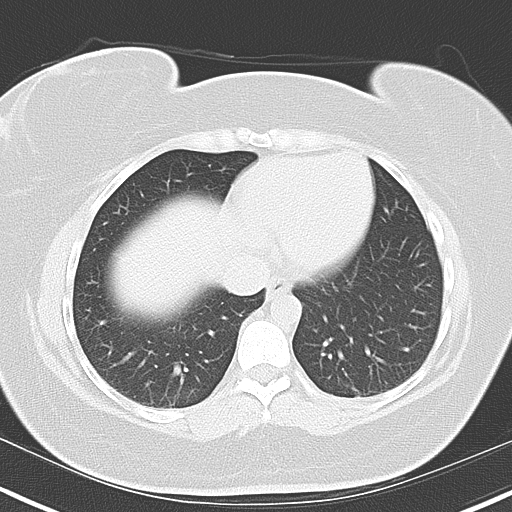
[im 12/15  soft-tissue]
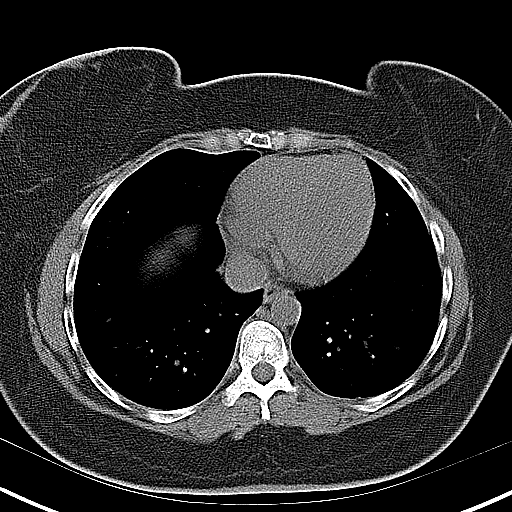
[im 12/15  lung]
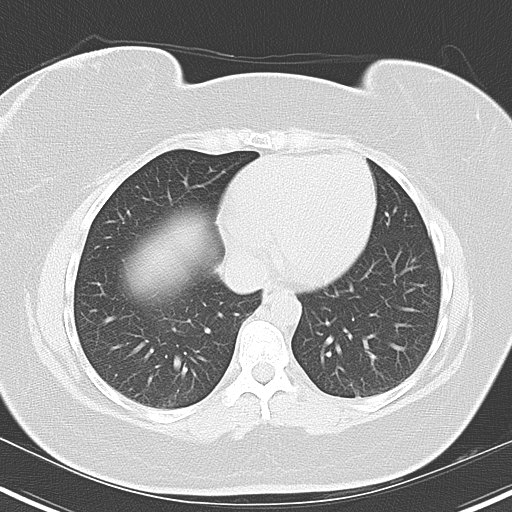
[im 13/15  soft-tissue]
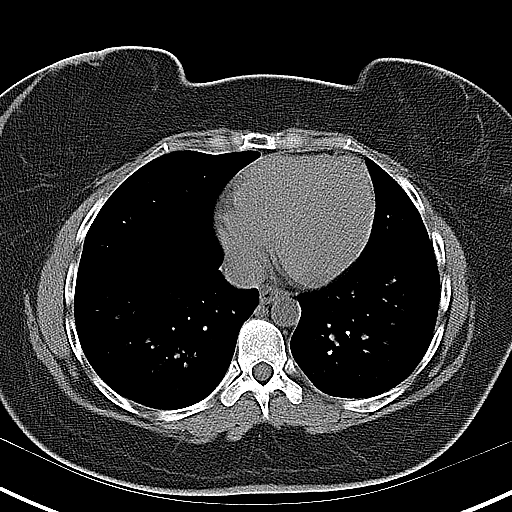
[im 13/15  lung]
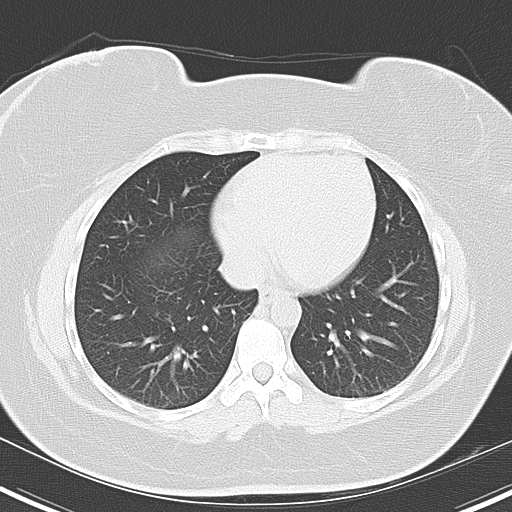
[im 14/15  soft-tissue]
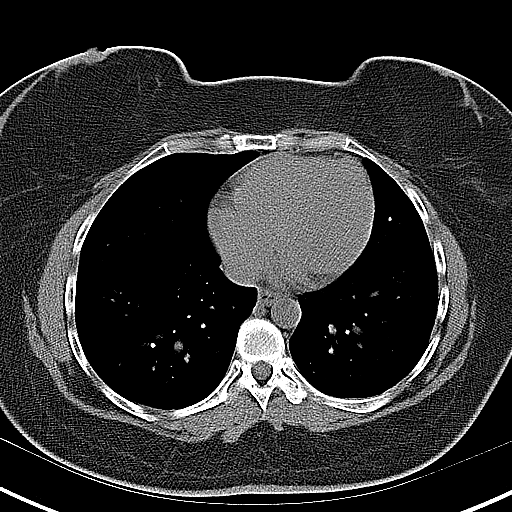
[im 14/15  lung]
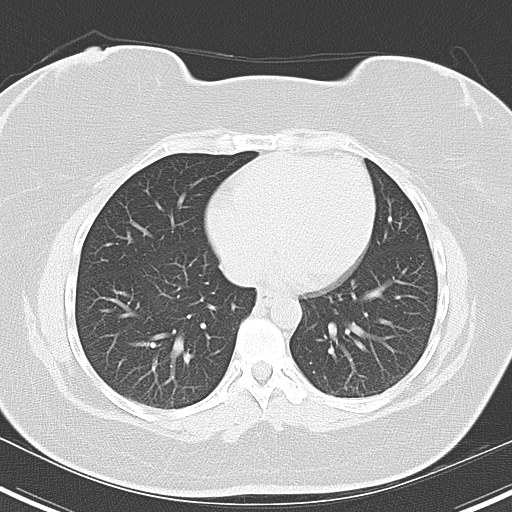

[13 of 15 positions shown; findings below may reference images not displayed]

FINDINGS: The lung bases are clear.

The kidneys appear symmetrical in size and shape.  There is a
punctate stone in the upper mid pole of the left kidney.  No
pyelocaliectasis or ureterectasis on either side.  No ureteral
stones or bladder stones are visualized.

Surgical absence of the gallbladder.  The unenhanced appearance of
the liver, spleen, pancreas, adrenal glands, abdominal aorta, and
retroperitoneal lymph nodes is unremarkable.  The stomach and small
bowel are mostly decompressed.  Stool filled colon without
distension.  No free air or free fluid in the abdomen.  Scattered
mesenteric lymph nodes are not pathologically enlarged and may be
reactive.  Minimal mesenteric infiltration or edema.

Pelvis:  The uterus is retroverted and is not enlarged.  There is
gas in the lower uterine segment endometrium.  Cystic structure in
the left adnexal region measuring 2.5 cm and likely representing an
ovarian cyst.  This is stable since previous study.  The bladder
wall appears mildly thickened and there is tenting of the dome of
the bladder.  This suggest urachal remnant.  There is infiltration
in the soft tissues anterior to the dome of the bladder with focal
nodular enlargement of the urachal remnant.  This could represent
infection, hematoma, or mass lesion. Urachal cancer should be
excluded.  Changes demonstrate some progression since the previous
study.  No free pelvic fluid collections.  The appendix is normal.
No evidence of diverticulitis.  Normal alignment of the lumbar
vertebrae.
IMPRESSION: Punctate nonobstructing stone in the left kidney.  No obstructing
ureteral stones are demonstrated.  The bladder wall is thickened
which may suggest cystitis or bladder hypertrophy.  There appears
to be a urachal remnant with irregular nodular enlargement and
infiltration in the fat around the urachal remnant.  Urachal
carcinoma or infection should be excluded.  Gas in the lower
uterine segment endometrium.

## 2013-05-20 ENCOUNTER — Emergency Department (INDEPENDENT_AMBULATORY_CARE_PROVIDER_SITE_OTHER)
Admission: EM | Admit: 2013-05-20 | Discharge: 2013-05-20 | Disposition: A | Discharge status: Home / Self Care | Payer: 59 | Source: Home / Self Care | Attending: Family Medicine | Admitting: Family Medicine

## 2013-05-20 ENCOUNTER — Encounter (HOSPITAL_COMMUNITY): Payer: Self-pay | Admitting: Emergency Medicine

## 2013-05-20 DIAGNOSIS — F418 Other specified anxiety disorders: Secondary | ICD-10-CM

## 2013-05-20 DIAGNOSIS — F341 Dysthymic disorder: Secondary | ICD-10-CM

## 2013-05-20 MED ORDER — CLONAZEPAM 1 MG PO TABS: 1.0000 mg | ORAL_TABLET | Freq: Two times a day (BID) | ORAL | Status: DC | PRN

## 2013-05-20 NOTE — ED Notes (Addendum)
Pt  Reports  A  History  Of  Depression   She  Has  Recent  Stressors         For  sev  Weeks       denys  Any  Current  Suicidal  Ideations  Thought  Processes     Are  Intact  Eye  Contact is  Fair           Reports   Not  Sleeping  Well  At  Night  As  Well  As   Anxiety  Feelings

## 2013-05-20 NOTE — ED Provider Notes (Signed)
CSN: [id number]     Arrival date & time 05/20/13  1215 History   First MD Initiated Contact with Patient 05/20/13 1332     Chief Complaint  Patient presents with  . Stress   (Consider location/radiation/quality/duration/timing/severity/associated sxs/prior Treatment) Patient is a 43 y.o. female presenting with mental health disorder. The history is provided by the patient.  Mental Health Problem Presenting symptoms: depression   Presenting symptoms: no suicidal thoughts   Degree of incapacity (severity):  Mild Onset quality:  Gradual Duration:  2 months Progression:  Worsening Chronicity:  Chronic (depression and ptsd, long h/o problems.) Associated symptoms: anxiety and insomnia   Associated symptoms comment:  Problems currently centered around husband and son. Risk factors: hx of mental illness     Past Medical History  Diagnosis Date  . Diabetes mellitus   . Fibromyalgia   . Hyperlipidemia   . Abdominal pain   . Nausea & vomiting   . Biliary dyskinesia   . Depression   . Joint pain   . Skin rash     due to medications  . Post traumatic stress disorder (PTSD)   . Sleep trouble   . Major depressive disorder   . Cervical strain   . Closed head injury 2010  . Renal stones   . Migraine     hx of none recent  . Arthritis    Past Surgical History  Procedure Laterality Date  . Lipoma removed  yrs ago  . Knee surgery Left 2012  . Cesarean section  1997, 1999  . Cholecystectomy  02/10/2011    Procedure: LAPAROSCOPIC CHOLECYSTECTOMY WITH INTRAOPERATIVE CHOLANGIOGRAM;  Surgeon: Judieth Keens, DO;  Location: [facility];  Service: General;  Laterality: N/A;  . Robotic assisted laparoscopic lysis of adhesion N/A 03/04/2012    Procedure: ROBOTIC ASSISTED LAPAROSCOPIC LYSIS OF EXTENSIVE ADHESIONS;  Surgeon: Claiborne Billings A. Pamala Hurry, MD;  Location: [facility];  Service: Gynecology;  Laterality: N/A;  . Tonsillectomy  2001  . Abdominal hysterectomy  2001  . Eye surgery  2006    laser eye  surgery left eye  . Exploratory laparomty  Mar 04, 2012  . Laparoscopic appendectomy N/A 10/12/2012    Procedure: DIAGNOSTIC APPENDECTOMY LAPAROSCOPIC;  Surgeon: Madilyn Hook, DO;  [facility];  Service: General;  Laterality: N/A;  . Appendectomy     Family History  Problem Relation Age of Onset  . Cancer Father     lymphoma  . Nephrolithiasis Father   . Vascular Disease Father   . Cancer Maternal Grandmother     colon  . Hypertension Mother   . Heart disease Mother   . Cataracts Mother   . Diabetes Brother   . Mental illness Maternal Grandfather   . Cancer Maternal Grandfather   . Heart disease Paternal Grandmother   . Heart disease Paternal Grandfather    History  Substance Use Topics  . Smoking status: Never Smoker   . Smokeless tobacco: Never Used  . Alcohol Use: No   OB History   Grav Para Term Preterm Abortions TAB SAB Ect Mult Living                 Review of Systems  Constitutional: Negative.   Psychiatric/Behavioral: Positive for dysphoric mood. Negative for suicidal ideas and sleep disturbance. The patient is nervous/anxious and has insomnia.     Allergies  Meloxicam; Cephalexin; and Ibuprofen  Home Medications   Prior to Admission medications   Medication Sig Start Date End Date Taking? Authorizing  Provider  acetaminophen (TYLENOL) 325 MG tablet Take 650 mg by mouth every 6 (six) hours as needed for mild pain.    Historical Provider, MD  aspirin EC 81 MG tablet Take 81 mg by mouth daily.    Historical Provider, MD  atorvastatin (LIPITOR) 40 MG tablet Take 1 tablet (40 mg total) by mouth daily. 12/01/12   Posey Boyer, MD  dicyclomine (BENTYL) 20 MG tablet Take 1 tablet (20 mg total) by mouth every 6 (six) hours. Prn pain 01/21/13   Leandrew Koyanagi, MD  insulin aspart (NOVOLOG) 100 UNIT/ML injection Inject 8 Units into the skin 3 (three) times daily with meals.    Historical Provider, MD  insulin glargine (LANTUS) 100 UNIT/ML injection Inject 28  Units into the skin every evening.    Historical Provider, MD  traMADol (ULTRAM) 50 MG tablet Take 1 tablet (50 mg total) by mouth every 6 (six) hours as needed. 04/14/13   Larene Pickett, PA-C   BP 146/88  Pulse 92  Temp(Src) 98.5 F (36.9 C) (Oral)  Resp 18  SpO2 99% Physical Exam  Nursing note and vitals reviewed. Constitutional: She is oriented to person, place, and time. She appears well-developed and well-nourished. No distress.  Eyes: Pupils are equal, round, and reactive to light.  Neck: Normal range of motion. Neck supple.  Neurological: She is alert and oriented to person, place, and time.  Skin: Skin is warm and dry.  Psychiatric: She has a normal mood and affect. Thought content normal.    ED Course  Procedures (including critical care time) Labs Review Labs Reviewed - No data to display  Imaging Review No results found.   MDM   1. Anxiety associated with depression        Billy Fischer, MD 05/20/13 1351

## 2013-05-20 NOTE — Discharge Instructions (Signed)
Use medicine as needed, contact dr Joseph Art on mon for further care.

## 2013-05-21 ENCOUNTER — Emergency Department (HOSPITAL_COMMUNITY)
Admission: EM | Admit: 2013-05-21 | Discharge: 2013-05-22 | Disposition: A | Discharge status: Other Acute Inpatient Hospital | Payer: 59 | Attending: Emergency Medicine | Admitting: Emergency Medicine

## 2013-05-21 ENCOUNTER — Encounter (HOSPITAL_COMMUNITY): Payer: Self-pay | Admitting: Emergency Medicine

## 2013-05-21 ENCOUNTER — Ambulatory Visit (HOSPITAL_COMMUNITY)
Admission: AD | Admit: 2013-05-21 | Discharge: 2013-05-21 | Disposition: A | Discharge status: Home / Self Care | Payer: 59 | Attending: Psychiatry | Admitting: Psychiatry

## 2013-05-21 DIAGNOSIS — Z8719 Personal history of other diseases of the digestive system: Secondary | ICD-10-CM | POA: Insufficient documentation

## 2013-05-21 DIAGNOSIS — F39 Unspecified mood [affective] disorder: Secondary | ICD-10-CM | POA: Insufficient documentation

## 2013-05-21 DIAGNOSIS — M129 Arthropathy, unspecified: Secondary | ICD-10-CM | POA: Insufficient documentation

## 2013-05-21 DIAGNOSIS — F410 Panic disorder [episodic paroxysmal anxiety] without agoraphobia: Secondary | ICD-10-CM | POA: Insufficient documentation

## 2013-05-21 DIAGNOSIS — E119 Type 2 diabetes mellitus without complications: Secondary | ICD-10-CM | POA: Insufficient documentation

## 2013-05-21 DIAGNOSIS — F339 Major depressive disorder, recurrent, unspecified: Secondary | ICD-10-CM | POA: Insufficient documentation

## 2013-05-21 DIAGNOSIS — Z87828 Personal history of other (healed) physical injury and trauma: Secondary | ICD-10-CM | POA: Insufficient documentation

## 2013-05-21 DIAGNOSIS — Z87442 Personal history of urinary calculi: Secondary | ICD-10-CM | POA: Insufficient documentation

## 2013-05-21 DIAGNOSIS — E785 Hyperlipidemia, unspecified: Secondary | ICD-10-CM | POA: Insufficient documentation

## 2013-05-21 DIAGNOSIS — Z79899 Other long term (current) drug therapy: Secondary | ICD-10-CM | POA: Insufficient documentation

## 2013-05-21 DIAGNOSIS — G43909 Migraine, unspecified, not intractable, without status migrainosus: Secondary | ICD-10-CM | POA: Insufficient documentation

## 2013-05-21 DIAGNOSIS — F329 Major depressive disorder, single episode, unspecified: Secondary | ICD-10-CM | POA: Diagnosis present

## 2013-05-21 DIAGNOSIS — Z7982 Long term (current) use of aspirin: Secondary | ICD-10-CM | POA: Insufficient documentation

## 2013-05-21 DIAGNOSIS — R45851 Suicidal ideations: Secondary | ICD-10-CM | POA: Insufficient documentation

## 2013-05-21 DIAGNOSIS — Z794 Long term (current) use of insulin: Secondary | ICD-10-CM | POA: Insufficient documentation

## 2013-05-21 LAB — CBG MONITORING, ED: Glucose-Capillary: 449 mg/dL — ABNORMAL HIGH (ref 70–99)
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
## 2013-05-21 NOTE — ED Notes (Addendum)
Pt arrived to the ED with a need of medical clearance.  Pt went yesterday to the Urgent Care for an anxiety attack.  Pt was told to come to [facility] for evaluation.  Pt went to [facility] and was sent here.  [facility] states that they have no beds but will take the pt when they do have a bed. Pt admits to having suicidal ideations but has no concrete plan.

## 2013-05-22 ENCOUNTER — Inpatient Hospital Stay (HOSPITAL_COMMUNITY)
Admission: AD | Admit: 2013-05-22 | Discharge: 2013-05-26 | DRG: 885 | Disposition: A | Discharge status: Home / Self Care | Payer: 59 | Source: Intra-hospital | Attending: Psychiatry | Admitting: Psychiatry

## 2013-05-22 ENCOUNTER — Encounter (HOSPITAL_COMMUNITY): Payer: Self-pay

## 2013-05-22 DIAGNOSIS — E119 Type 2 diabetes mellitus without complications: Secondary | ICD-10-CM | POA: Diagnosis present

## 2013-05-22 DIAGNOSIS — Z833 Family history of diabetes mellitus: Secondary | ICD-10-CM

## 2013-05-22 DIAGNOSIS — E109 Type 1 diabetes mellitus without complications: Secondary | ICD-10-CM

## 2013-05-22 DIAGNOSIS — R45851 Suicidal ideations: Secondary | ICD-10-CM

## 2013-05-22 DIAGNOSIS — IMO0001 Reserved for inherently not codable concepts without codable children: Secondary | ICD-10-CM | POA: Diagnosis present

## 2013-05-22 DIAGNOSIS — Z5989 Other problems related to housing and economic circumstances: Secondary | ICD-10-CM

## 2013-05-22 DIAGNOSIS — M129 Arthropathy, unspecified: Secondary | ICD-10-CM | POA: Diagnosis present

## 2013-05-22 DIAGNOSIS — E785 Hyperlipidemia, unspecified: Secondary | ICD-10-CM | POA: Diagnosis present

## 2013-05-22 DIAGNOSIS — E11319 Type 2 diabetes mellitus with unspecified diabetic retinopathy without macular edema: Secondary | ICD-10-CM

## 2013-05-22 DIAGNOSIS — F332 Major depressive disorder, recurrent severe without psychotic features: Principal | ICD-10-CM | POA: Diagnosis present

## 2013-05-22 DIAGNOSIS — Z87442 Personal history of urinary calculi: Secondary | ICD-10-CM

## 2013-05-22 DIAGNOSIS — Z7982 Long term (current) use of aspirin: Secondary | ICD-10-CM

## 2013-05-22 DIAGNOSIS — Z8249 Family history of ischemic heart disease and other diseases of the circulatory system: Secondary | ICD-10-CM

## 2013-05-22 DIAGNOSIS — Z807 Family history of other malignant neoplasms of lymphoid, hematopoietic and related tissues: Secondary | ICD-10-CM

## 2013-05-22 DIAGNOSIS — Z794 Long term (current) use of insulin: Secondary | ICD-10-CM

## 2013-05-22 DIAGNOSIS — F411 Generalized anxiety disorder: Secondary | ICD-10-CM | POA: Diagnosis present

## 2013-05-22 DIAGNOSIS — Z598 Other problems related to housing and economic circumstances: Secondary | ICD-10-CM

## 2013-05-22 DIAGNOSIS — F329 Major depressive disorder, single episode, unspecified: Secondary | ICD-10-CM | POA: Diagnosis present

## 2013-05-22 DIAGNOSIS — F4312 Post-traumatic stress disorder, chronic: Secondary | ICD-10-CM

## 2013-05-22 DIAGNOSIS — G4700 Insomnia, unspecified: Secondary | ICD-10-CM | POA: Diagnosis present

## 2013-05-22 DIAGNOSIS — F4310 Post-traumatic stress disorder, unspecified: Secondary | ICD-10-CM | POA: Diagnosis present

## 2013-05-22 DIAGNOSIS — Z5987 Material hardship due to limited financial resources, not elsewhere classified: Secondary | ICD-10-CM

## 2013-05-22 HISTORY — DX: Hypoglycemia, unspecified: E16.2

## 2013-05-22 LAB — SALICYLATE LEVEL: Salicylate Lvl: <2 mg/dL — ABNORMAL LOW (ref 2.8–20.0)

## 2013-05-22 LAB — CBC
HCT: 44.3 % (ref 36.0–46.0)
Hemoglobin: 15.2 g/dL — ABNORMAL HIGH (ref 12.0–15.0)
MCH: 32.8 pg (ref 26.0–34.0)
MCHC: 34.3 g/dL (ref 30.0–36.0)
MCV: 95.7 fL (ref 78.0–100.0)
Platelets: 380 10*3/uL (ref 150–400)
RBC: 4.63 MIL/uL (ref 3.87–5.11)
RDW: 12.8 % (ref 11.5–15.5)
WBC: 11.4 10*3/uL — ABNORMAL HIGH (ref 4.0–10.5)

## 2013-05-22 LAB — CBG MONITORING, ED
Glucose-Capillary: 327 mg/dL — ABNORMAL HIGH (ref 70–99)
Glucose-Capillary: 202 mg/dL — ABNORMAL HIGH (ref 70–99)
Glucose-Capillary: 341 mg/dL — ABNORMAL HIGH (ref 70–99)
Glucose-Capillary: 379 mg/dL — ABNORMAL HIGH (ref 70–99)
Glucose-Capillary: 355 mg/dL — ABNORMAL HIGH (ref 70–99)
Glucose-Capillary: 290 mg/dL — ABNORMAL HIGH (ref 70–99)
Glucose-Capillary: 216 mg/dL — ABNORMAL HIGH (ref 70–99)
Glucose-Capillary: 131 mg/dL — ABNORMAL HIGH (ref 70–99)

## 2013-05-22 LAB — COMPREHENSIVE METABOLIC PANEL
ALT: 10 U/L (ref 0–35)
AST: 12 U/L (ref 0–37)
Albumin: 3.8 g/dL (ref 3.5–5.2)
Alkaline Phosphatase: 116 U/L (ref 39–117)
BUN: 13 mg/dL (ref 6–23)
CO2: 27 mEq/L (ref 19–32)
Calcium: 9.5 mg/dL (ref 8.4–10.5)
Chloride: 94 mEq/L — ABNORMAL LOW (ref 96–112)
Creatinine, Ser: 1 mg/dL (ref 0.50–1.10)
GFR calc Af Amer: 81 mL/min — ABNORMAL LOW (ref >90)
GFR calc non Af Amer: 69 mL/min — ABNORMAL LOW (ref >90)
Glucose, Bld: 505 mg/dL — ABNORMAL HIGH (ref 70–99)
Potassium: 5 mEq/L (ref 3.7–5.3)
Sodium: 134 mEq/L — ABNORMAL LOW (ref 137–147)
Total Bilirubin: 0.6 mg/dL (ref 0.3–1.2)
Total Protein: 8.2 g/dL (ref 6.0–8.3)

## 2013-05-22 LAB — RAPID URINE DRUG SCREEN, HOSP PERFORMED
Amphetamines: NOT DETECTED
Barbiturates: NOT DETECTED
Benzodiazepines: NOT DETECTED
Cocaine: NOT DETECTED
Opiates: NOT DETECTED
Tetrahydrocannabinol: NOT DETECTED

## 2013-05-22 LAB — ACETAMINOPHEN LEVEL: Acetaminophen (Tylenol), Serum: <15 ug/mL (ref 10–30)

## 2013-05-22 LAB — GLUCOSE, CAPILLARY
Glucose-Capillary: 61 mg/dL — ABNORMAL LOW (ref 70–99)
Glucose-Capillary: 64 mg/dL — ABNORMAL LOW (ref 70–99)
Glucose-Capillary: 79 mg/dL (ref 70–99)

## 2013-05-22 LAB — ETHANOL: Alcohol, Ethyl (B): <11 mg/dL (ref 0–11)

## 2013-05-22 MED ORDER — ASPIRIN EC 81 MG PO TBEC
81.0000 mg | DELAYED_RELEASE_TABLET | Freq: Every day | ORAL | Status: DC
  Administered 2013-05-22: 81 mg via ORAL
  Filled 2013-05-22: qty 1

## 2013-05-22 MED ORDER — LIDOCAINE 5 % EX PTCH
1.0000 | MEDICATED_PATCH | Freq: Every day | CUTANEOUS | Status: DC | PRN
  Administered 2013-05-24 – 2013-05-25 (×2): 1 via TRANSDERMAL
  Filled 2013-05-22 (×3): qty 1

## 2013-05-22 MED ORDER — DIPHENHYDRAMINE HCL 25 MG PO CAPS: 25.0000 mg | ORAL_CAPSULE | Freq: Every day | ORAL | Status: DC | PRN

## 2013-05-22 MED ORDER — OXYCODONE HCL 5 MG PO TABS
5.0000 mg | ORAL_TABLET | Freq: Three times a day (TID) | ORAL | Status: DC | PRN
  Administered 2013-05-23 – 2013-05-26 (×5): 5 mg via ORAL
  Filled 2013-05-22 (×5): qty 1

## 2013-05-22 MED ORDER — CLONAZEPAM 1 MG PO TABS
1.0000 mg | ORAL_TABLET | Freq: Two times a day (BID) | ORAL | Status: DC | PRN
  Administered 2013-05-23 – 2013-05-25 (×3): 1 mg via ORAL
  Filled 2013-05-22 (×3): qty 1

## 2013-05-22 MED ORDER — SODIUM CHLORIDE 0.9 % IV BOLUS (SEPSIS)
1000.0000 mL | INTRAVENOUS | Status: AC
  Administered 2013-05-22: 1000 mL via INTRAVENOUS

## 2013-05-22 MED ORDER — INSULIN ASPART 100 UNIT/ML ~~LOC~~ SOLN
8.0000 [IU] | Freq: Three times a day (TID) | SUBCUTANEOUS | Status: DC
  Administered 2013-05-22 (×2): 8 [IU] via SUBCUTANEOUS
  Filled 2013-05-22: qty 1

## 2013-05-22 MED ORDER — MAGNESIUM HYDROXIDE 400 MG/5ML PO SUSP: 30.0000 mL | Freq: Every day | ORAL | Status: DC | PRN

## 2013-05-22 MED ORDER — INSULIN GLARGINE 100 UNIT/ML ~~LOC~~ SOLN
28.0000 [IU] | Freq: Every day | SUBCUTANEOUS | Status: DC
  Administered 2013-05-23: 28 [IU] via SUBCUTANEOUS

## 2013-05-22 MED ORDER — INSULIN GLARGINE 100 UNIT/ML ~~LOC~~ SOLN
28.0000 [IU] | Freq: Every evening | SUBCUTANEOUS | Status: DC
  Administered 2013-05-22: 28 [IU] via SUBCUTANEOUS
  Filled 2013-05-22: qty 0.28

## 2013-05-22 MED ORDER — ACETAMINOPHEN 325 MG PO TABS: 650.0000 mg | ORAL_TABLET | Freq: Four times a day (QID) | ORAL | Status: DC | PRN

## 2013-05-22 MED ORDER — ASPIRIN EC 81 MG PO TBEC
81.0000 mg | DELAYED_RELEASE_TABLET | Freq: Every day | ORAL | Status: DC
  Administered 2013-05-23 – 2013-05-26 (×4): 81 mg via ORAL
  Filled 2013-05-22 (×8): qty 1

## 2013-05-22 MED ORDER — INSULIN ASPART 100 UNIT/ML ~~LOC~~ SOLN
8.0000 [IU] | Freq: Three times a day (TID) | SUBCUTANEOUS | Status: DC
  Administered 2013-05-23 – 2013-05-24 (×3): 8 [IU] via SUBCUTANEOUS

## 2013-05-22 MED ORDER — ATORVASTATIN CALCIUM 40 MG PO TABS
40.0000 mg | ORAL_TABLET | Freq: Every day | ORAL | Status: DC
  Administered 2013-05-23 – 2013-05-26 (×4): 40 mg via ORAL
  Filled 2013-05-22 (×7): qty 1

## 2013-05-22 MED ORDER — INSULIN ASPART 100 UNIT/ML ~~LOC~~ SOLN
10.0000 [IU] | Freq: Once | SUBCUTANEOUS | Status: AC
  Administered 2013-05-22: 10 [IU] via INTRAVENOUS
  Filled 2013-05-22: qty 1

## 2013-05-22 MED ORDER — LORAZEPAM 1 MG PO TABS: 1.0000 mg | ORAL_TABLET | Freq: Three times a day (TID) | ORAL | Status: DC | PRN

## 2013-05-22 MED ORDER — OXYCODONE HCL 5 MG PO TABS
5.0000 mg | ORAL_TABLET | Freq: Three times a day (TID) | ORAL | Status: DC | PRN
  Administered 2013-05-22: 5 mg via ORAL
  Filled 2013-05-22: qty 1

## 2013-05-22 MED ORDER — ONDANSETRON HCL 4 MG PO TABS: 4.0000 mg | ORAL_TABLET | Freq: Three times a day (TID) | ORAL | Status: DC | PRN

## 2013-05-22 MED ORDER — INSULIN ASPART 100 UNIT/ML ~~LOC~~ SOLN: 10.0000 [IU] | Freq: Once | SUBCUTANEOUS | Status: DC

## 2013-05-22 MED ORDER — ALUM & MAG HYDROXIDE-SIMETH 200-200-20 MG/5ML PO SUSP: 30.0000 mL | ORAL | Status: DC | PRN

## 2013-05-22 MED ORDER — INSULIN GLARGINE 100 UNIT/ML ~~LOC~~ SOLN: 34.0000 [IU] | Freq: Every evening | SUBCUTANEOUS | Status: DC

## 2013-05-22 MED ORDER — INSULIN ASPART 100 UNIT/ML ~~LOC~~ SOLN
0.0000 [IU] | Freq: Three times a day (TID) | SUBCUTANEOUS | Status: DC
  Administered 2013-05-22: 5 [IU] via SUBCUTANEOUS
  Administered 2013-05-22: 15 [IU] via SUBCUTANEOUS
  Filled 2013-05-22 (×2): qty 1

## 2013-05-22 MED ORDER — ZOLPIDEM TARTRATE 5 MG PO TABS: 5.0000 mg | ORAL_TABLET | Freq: Every evening | ORAL | Status: DC | PRN

## 2013-05-22 MED ORDER — TRAZODONE HCL 50 MG PO TABS
50.0000 mg | ORAL_TABLET | Freq: Every evening | ORAL | Status: DC | PRN
  Administered 2013-05-22 – 2013-05-25 (×6): 50 mg via ORAL
  Filled 2013-05-22 (×13): qty 1

## 2013-05-22 MED ORDER — NICOTINE 21 MG/24HR TD PT24: 21.0000 mg | MEDICATED_PATCH | Freq: Every day | TRANSDERMAL | Status: DC

## 2013-05-22 MED ORDER — ATORVASTATIN CALCIUM 40 MG PO TABS: 40.0000 mg | ORAL_TABLET | Freq: Every day | ORAL | Status: DC

## 2013-05-22 MED ORDER — CLONAZEPAM 1 MG PO TABS
1.0000 mg | ORAL_TABLET | Freq: Two times a day (BID) | ORAL | Status: DC | PRN
  Administered 2013-05-22: 1 mg via ORAL
  Filled 2013-05-22: qty 1

## 2013-05-22 MED ORDER — INSULIN ASPART 100 UNIT/ML ~~LOC~~ SOLN
0.0000 [IU] | Freq: Three times a day (TID) | SUBCUTANEOUS | Status: DC
  Administered 2013-05-23 (×2): 7 [IU] via SUBCUTANEOUS
  Administered 2013-05-24: 11 [IU] via SUBCUTANEOUS

## 2013-05-22 MED ORDER — ATORVASTATIN CALCIUM 40 MG PO TABS
40.0000 mg | ORAL_TABLET | Freq: Every day | ORAL | Status: DC
  Administered 2013-05-22: 40 mg via ORAL
  Filled 2013-05-22: qty 1

## 2013-05-22 MED ORDER — ACETAMINOPHEN 325 MG PO TABS: 650.0000 mg | ORAL_TABLET | ORAL | Status: DC | PRN

## 2013-05-22 MED ORDER — NICOTINE 21 MG/24HR TD PT24
21.0000 mg | MEDICATED_PATCH | Freq: Every day | TRANSDERMAL | Status: DC
  Filled 2013-05-22 (×4): qty 1

## 2013-05-22 MED ORDER — HYDROXYZINE HCL 25 MG PO TABS
25.0000 mg | ORAL_TABLET | Freq: Four times a day (QID) | ORAL | Status: DC | PRN
  Administered 2013-05-25: 25 mg via ORAL
  Filled 2013-05-22 (×2): qty 1

## 2013-05-22 MED ORDER — INSULIN ASPART 100 UNIT/ML ~~LOC~~ SOLN: 8.0000 [IU] | Freq: Three times a day (TID) | SUBCUTANEOUS | Status: DC

## 2013-05-22 NOTE — [facility] Counselor (Signed)
Pt accepted to bed 506-1. Pt will not go over to [facility] until CBG within normal limits for 24 hrs. Support paperwork signed and faxed to [name]. [employer] placed in pt's chart. Per Letitia Libra [facility], pt's bed with bed held for her.   Arnold Long, [place] Assessment Counselor

## 2013-05-22 NOTE — ED Provider Notes (Signed)
Patient accepted to [facility], Dr. Kathie Dike.  Per report, no new complaints, VSS  Carmin Muskrat, MD 05/22/13 2037

## 2013-05-22 NOTE — ED Notes (Signed)
Report called to RN Katharine Look, [facility], rm [phone].Pending [facility] transport.

## 2013-05-22 NOTE — ED Notes (Signed)
Belongings placed in locker 84.

## 2013-05-22 NOTE — ED Notes (Signed)
[facility] called and made aware of need for insulin order

## 2013-05-22 NOTE — ED Notes (Signed)
Pt states her husband can have 15 min visiting time if he comes to see her so they discuss graduation plans for her son.

## 2013-05-22 NOTE — ED Provider Notes (Signed)
CSN: [id number]     Arrival date & time 05/21/13  2233 History   First MD Initiated Contact with Patient 05/22/13 0005     Chief Complaint  Patient presents with  . Medical Clearance     (Consider location/radiation/quality/duration/timing/severity/associated sxs/prior Treatment) HPI Comments: Patient is a 43 year old female with a past medical history of diabetes, fibromyalgia and depression who presents with suicidal ideations for the past few days. Patient reports worsening depression over the past few months. She reports feeling suicidal without a plan. No homicidal ideations. No drug or alcohol use. No aggravating/alleviating factors. Patient was seen at Urgent Care yesterday for a panic attack.    Past Medical History  Diagnosis Date  . Diabetes mellitus   . Fibromyalgia   . Hyperlipidemia   . Abdominal pain   . Nausea & vomiting   . Biliary dyskinesia   . Depression   . Joint pain   . Skin rash     due to medications  . Post traumatic stress disorder (PTSD)   . Sleep trouble   . Major depressive disorder   . Cervical strain   . Closed head injury 2010  . Renal stones   . Migraine     hx of none recent  . Arthritis    Past Surgical History  Procedure Laterality Date  . Lipoma removed  yrs ago  . Knee surgery Left 2012  . Cesarean section  1997, 1999  . Cholecystectomy  02/10/2011    Procedure: LAPAROSCOPIC CHOLECYSTECTOMY WITH INTRAOPERATIVE CHOLANGIOGRAM;  Surgeon: Judieth Keens, DO;  Location: [facility];  Service: General;  Laterality: N/A;  . Robotic assisted laparoscopic lysis of adhesion N/A 03/04/2012    Procedure: ROBOTIC ASSISTED LAPAROSCOPIC LYSIS OF EXTENSIVE ADHESIONS;  Surgeon: Claiborne Billings A. Pamala Hurry, MD;  Location: [facility];  Service: Gynecology;  Laterality: N/A;  . Tonsillectomy  2001  . Abdominal hysterectomy  2001  . Eye surgery  2006    laser eye surgery left eye  . Exploratory laparomty  Mar 04, 2012  . Laparoscopic appendectomy N/A 10/12/2012   Procedure: DIAGNOSTIC APPENDECTOMY LAPAROSCOPIC;  Surgeon: Madilyn Hook, DO;  Location: [facility];  Service: General;  Laterality: N/A;  . Appendectomy     Family History  Problem Relation Age of Onset  . Cancer Father     lymphoma  . Nephrolithiasis Father   . Vascular Disease Father   . Cancer Maternal Grandmother     colon  . Hypertension Mother   . Heart disease Mother   . Cataracts Mother   . Diabetes Brother   . Mental illness Maternal Grandfather   . Cancer Maternal Grandfather   . Heart disease Paternal Grandmother   . Heart disease Paternal Grandfather    History  Substance Use Topics  . Smoking status: Never Smoker   . Smokeless tobacco: Never Used  . Alcohol Use: No   OB History   Grav Para Term Preterm Abortions TAB SAB Ect Mult Living                 Review of Systems  Constitutional: Negative for fever, chills and fatigue.  HENT: Negative for trouble swallowing.   Eyes: Negative for visual disturbance.  Respiratory: Negative for shortness of breath.   Cardiovascular: Negative for chest pain and palpitations.  Gastrointestinal: Negative for nausea, vomiting, abdominal pain and diarrhea.  Genitourinary: Negative for dysuria and difficulty urinating.  Musculoskeletal: Negative for arthralgias and neck pain.  Skin: Negative for color change.  Neurological: Negative for dizziness and weakness.  Psychiatric/Behavioral: Positive for suicidal ideas. Negative for dysphoric mood. The patient is nervous/anxious.       Allergies  Meloxicam; Cephalexin; and Ibuprofen  Home Medications   Prior to Admission medications   Medication Sig Start Date End Date Taking? Authorizing Provider  acetaminophen (TYLENOL) 325 MG tablet Take 650 mg by mouth every 6 (six) hours as needed for mild pain.   Yes Historical Provider, MD  aspirin EC 81 MG tablet Take 81 mg by mouth daily.   Yes Historical Provider, MD  atorvastatin (LIPITOR) 40 MG tablet Take 1 tablet (40 mg total) by  mouth daily. 12/01/12  Yes Posey Boyer, MD  clonazePAM (KLONOPIN) 1 MG tablet Take 1 tablet (1 mg total) by mouth 2 (two) times daily as needed for anxiety. 05/20/13  Yes Billy Fischer, MD  diphenhydrAMINE (BENADRYL) 25 mg capsule Take 25 mg by mouth daily as needed for allergies.   Yes Historical Provider, MD  insulin aspart (NOVOLOG) 100 UNIT/ML injection Inject 8 Units into the skin 3 (three) times daily with meals.   Yes Historical Provider, MD  insulin glargine (LANTUS) 100 UNIT/ML injection Inject 28 Units into the skin every evening.   Yes Historical Provider, MD  lidocaine (LIDODERM) 5 % Place 1 patch onto the skin daily as needed (for pain.). Remove & Discard patch within 12 hours or as directed by MD   Yes Historical Provider, MD  Melatonin-Pyridoxine (MELATIN PO) Take 1 tablet by mouth at bedtime.   Yes Historical Provider, MD  oxyCODONE (OXY IR/ROXICODONE) 5 MG immediate release tablet Take 5 mg by mouth 3 (three) times daily as needed. For pain. 05/05/13  Yes Historical Provider, MD  VALERIAN PO Take 3 tablets by mouth at bedtime.   Yes Historical Provider, MD   BP 125/68  Pulse 81  Temp(Src) 98.7 F (37.1 C) (Oral)  Resp 17  Ht 5\' 4"  (1.626 m)  Wt 180 lb (81.647 kg)  BMI 30.88 kg/m2  SpO2 99% Physical Exam  Nursing note and vitals reviewed. Constitutional: She is oriented to person, place, and time. She appears well-developed and well-nourished. No distress.  HENT:  Head: Normocephalic and atraumatic.  Eyes: Conjunctivae are normal.  Neck: Normal range of motion.  Cardiovascular: Normal rate and regular rhythm.  Exam reveals no gallop and no friction rub.   No murmur heard. Pulmonary/Chest: Effort normal and breath sounds normal. She has no wheezes. She has no rales. She exhibits no tenderness.  Abdominal: Soft. She exhibits no distension. There is no tenderness. There is no rebound and no guarding.  Musculoskeletal: Normal range of motion.  Neurological: She is alert and  oriented to person, place, and time. Coordination normal.  Speech is goal-oriented. Moves limbs without ataxia.   Skin: Skin is warm and dry.  Psychiatric:  Dysphoric mood. Flat affect.     ED Course  Procedures (including critical care time) Labs Review Labs Reviewed  CBC - Abnormal; Notable for the following:    WBC 11.4 (*)    Hemoglobin 15.2 (*)    All other components within normal limits  COMPREHENSIVE METABOLIC PANEL - Abnormal; Notable for the following:    Sodium 134 (*)    Chloride 94 (*)    Glucose, Bld 505 (*)    GFR calc non Af Amer 69 (*)    GFR calc Af Amer 81 (*)    All other components within normal limits  SALICYLATE LEVEL - Abnormal; Notable for  the following:    Salicylate Lvl <6.9 (*)    All other components within normal limits  CBG MONITORING, ED - Abnormal; Notable for the following:    Glucose-Capillary 449 (*)    All other components within normal limits  CBG MONITORING, ED - Abnormal; Notable for the following:    Glucose-Capillary 327 (*)    All other components within normal limits  CBG MONITORING, ED - Abnormal; Notable for the following:    Glucose-Capillary 202 (*)    All other components within normal limits  CBG MONITORING, ED - Abnormal; Notable for the following:    Glucose-Capillary 341 (*)    All other components within normal limits  CBG MONITORING, ED - Abnormal; Notable for the following:    Glucose-Capillary 379 (*)    All other components within normal limits  CBG MONITORING, ED - Abnormal; Notable for the following:    Glucose-Capillary 355 (*)    All other components within normal limits  CBG MONITORING, ED - Abnormal; Notable for the following:    Glucose-Capillary 290 (*)    All other components within normal limits  CBG MONITORING, ED - Abnormal; Notable for the following:    Glucose-Capillary 216 (*)    All other components within normal limits  CBG MONITORING, ED - Abnormal; Notable for the following:     Glucose-Capillary 131 (*)    All other components within normal limits  ACETAMINOPHEN LEVEL  ETHANOL  URINE RAPID DRUG SCREEN (HOSP PERFORMED)    Imaging Review No results found.   EKG Interpretation None      MDM   Final diagnoses:  Major depressive disorder, recurrent    3:13 AM Labs show elevated glucose at 505. Patient's anion gap is 13. Patient will be treated with IV fluids and insulin. When patient's blood glucose is improved, patient will be moved to the psych ED.  5:52 AM Patient's blood glucose is now 202 and she will be moved to the psych ED.     Alvina Chou, [place] 05/24/13 [phone]

## 2013-05-22 NOTE — ED Notes (Signed)
Bed: W[facility]37 Expected date:  Expected time:  Means of arrival:  Comments: 

## 2013-05-22 NOTE — Consult Note (Signed)
[facility] Face-to-Face Psychiatry Consult   Reason for Consult:  Depression Referring Physician:  ED physician Maureen [name] is an 43 y.o. female. Total Time spent with patient: 30 minutes  Assessment: AXIS I:  Major Depression, Recurrent severe AXIS II:  Deferred AXIS III:   Past Medical History  Diagnosis Date  . Diabetes mellitus   . Fibromyalgia   . Hyperlipidemia   . Abdominal pain   . Nausea & vomiting   . Biliary dyskinesia   . Depression   . Joint pain   . Skin rash     due to medications  . Post traumatic stress disorder (PTSD)   . Sleep trouble   . Major depressive disorder   . Cervical strain   . Closed head injury 2010  . Renal stones   . Migraine     hx of none recent  . Arthritis    AXIS IV:  housing problems, other psychosocial or environmental problems, problems with access to health care services and problems with primary support group AXIS V:  41-50 serious symptoms  Plan:  Recommend psychiatric Inpatient admission when medically cleared.  Subjective:   Maureen [name] is a 43 y.o. female patient admitted with a depressive symptoms and the hopelessness.   HPI:  Patient presented with depressive symptoms and having difficulty in the relationship with her husband. She's been diagnosed with PTSD when she was young she is feeling overwhelmed depressed hopeless and there is feeling that she referred herself without getting treatment.  Disturbed sleep energy appetite and also feeling not having enough energy feeling hopeless and continues to have possible suicide.  Denies psychotic symptoms no clear history of mania. HPI Elements:   Location:  depression. Quality:  recurrent. Severity:  severe.  Past Psychiatric History: Past Medical History  Diagnosis Date  . Diabetes mellitus   . Fibromyalgia   . Hyperlipidemia   . Abdominal pain   . Nausea & vomiting   . Biliary dyskinesia   . Depression   . Joint pain   . Skin rash     due to medications   . Post traumatic stress disorder (PTSD)   . Sleep trouble   . Major depressive disorder   . Cervical strain   . Closed head injury 2010  . Renal stones   . Migraine     hx of none recent  . Arthritis     reports that she has never smoked. She has never used smokeless tobacco. She reports that she does not drink alcohol or use illicit drugs. Family History  Problem Relation Age of Onset  . Cancer Father     lymphoma  . Nephrolithiasis Father   . Vascular Disease Father   . Cancer Maternal Grandmother     colon  . Hypertension Mother   . Heart disease Mother   . Cataracts Mother   . Diabetes Brother   . Mental illness Maternal Grandfather   . Cancer Maternal Grandfather   . Heart disease Paternal Grandmother   . Heart disease Paternal Grandfather            Allergies:   Allergies  Allergen Reactions  . Meloxicam Nausea Only  . Cephalexin Rash  . Ibuprofen Nausea And Vomiting and Rash    ACT Assessment Complete:  Yes:    Educational Status    Risk to Self: Risk to self Is patient at risk for suicide?: No Substance abuse history and/or treatment for substance abuse?: No  Risk to Others:  Abuse:    Prior Inpatient Therapy:    Prior Outpatient Therapy:    Additional Information:                    Objective: Blood pressure 98/58, pulse 73, temperature 97.7 F (36.5 C), temperature source Oral, resp. rate 16, height $RemoveBe'5\' 4"'uXOElpQkA$  (1.626 m), weight 81.647 kg (180 lb), SpO2 100.00%.Body mass index is 30.88 kg/(m^2). Results for orders placed during the hospital encounter of 05/21/13 (from the past 72 hour(s))  CBG MONITORING, ED     Status: Abnormal   Collection Time    05/21/13 11:52 PM      Result Value Ref Range   Glucose-Capillary 449 (*) 70 - 99 mg/dL   Comment 1 Documented in Chart     Comment 2 Notify RN    URINE RAPID DRUG SCREEN (HOSP PERFORMED)     Status: None   Collection Time    05/21/13 11:53 PM      Result Value Ref Range   Opiates  NONE DETECTED  NONE DETECTED   Cocaine NONE DETECTED  NONE DETECTED   Benzodiazepines NONE DETECTED  NONE DETECTED   Amphetamines NONE DETECTED  NONE DETECTED   Tetrahydrocannabinol NONE DETECTED  NONE DETECTED   Barbiturates NONE DETECTED  NONE DETECTED   Comment:            DRUG SCREEN FOR MEDICAL PURPOSES     ONLY.  IF CONFIRMATION IS NEEDED     FOR ANY PURPOSE, NOTIFY LAB     WITHIN 5 DAYS.                LOWEST DETECTABLE LIMITS     FOR URINE DRUG SCREEN     Drug Class       Cutoff (ng/mL)     Amphetamine      1000     Barbiturate      200     Benzodiazepine   235     Tricyclics       361     Opiates          300     Cocaine          300     THC              50  ACETAMINOPHEN LEVEL     Status: None   Collection Time    05/22/13 12:09 AM      Result Value Ref Range   Acetaminophen (Tylenol), Serum <15.0  10 - 30 ug/mL   Comment:            THERAPEUTIC CONCENTRATIONS VARY     SIGNIFICANTLY. A RANGE OF 10-30     ug/mL MAY BE AN EFFECTIVE     CONCENTRATION FOR MANY PATIENTS.     HOWEVER, SOME ARE BEST TREATED     AT CONCENTRATIONS OUTSIDE THIS     RANGE.     ACETAMINOPHEN CONCENTRATIONS     >150 ug/mL AT 4 HOURS AFTER     INGESTION AND >50 ug/mL AT 12     HOURS AFTER INGESTION ARE     OFTEN ASSOCIATED WITH TOXIC     REACTIONS.  CBC     Status: Abnormal   Collection Time    05/22/13 12:09 AM      Result Value Ref Range   WBC 11.4 (*) 4.0 - 10.5 K/uL   RBC 4.63  3.87 - 5.11 MIL/uL   Hemoglobin 15.2 (*) 12.0 -  15.0 g/dL   HCT 44.3  36.0 - 46.0 %   MCV 95.7  78.0 - 100.0 fL   MCH 32.8  26.0 - 34.0 pg   MCHC 34.3  30.0 - 36.0 g/dL   RDW 12.8  11.5 - 15.5 %   Platelets 380  150 - 400 K/uL  COMPREHENSIVE METABOLIC PANEL     Status: Abnormal   Collection Time    05/22/13 12:09 AM      Result Value Ref Range   Sodium 134 (*) 137 - 147 mEq/L   Potassium 5.0  3.7 - 5.3 mEq/L   Chloride 94 (*) 96 - 112 mEq/L   CO2 27  19 - 32 mEq/L   Glucose, Bld 505 (*) 70 - 99  mg/dL   BUN 13  6 - 23 mg/dL   Creatinine, Ser 1.00  0.50 - 1.10 mg/dL   Calcium 9.5  8.4 - 10.5 mg/dL   Total Protein 8.2  6.0 - 8.3 g/dL   Albumin 3.8  3.5 - 5.2 g/dL   AST 12  0 - 37 U/L   ALT 10  0 - 35 U/L   Alkaline Phosphatase 116  39 - 117 U/L   Total Bilirubin 0.6  0.3 - 1.2 mg/dL   GFR calc non Af Amer 69 (*) >90 mL/min   GFR calc Af Amer 81 (*) >90 mL/min   Comment: (NOTE)     The eGFR has been calculated using the CKD EPI equation.     This calculation has not been validated in all clinical situations.     eGFR's persistently <90 mL/min signify possible Chronic Kidney     Disease.  ETHANOL     Status: None   Collection Time    05/22/13 12:09 AM      Result Value Ref Range   Alcohol, Ethyl (B) <11  0 - 11 mg/dL   Comment:            LOWEST DETECTABLE LIMIT FOR     SERUM ALCOHOL IS 11 mg/dL     FOR MEDICAL PURPOSES ONLY  SALICYLATE LEVEL     Status: Abnormal   Collection Time    05/22/13 12:09 AM      Result Value Ref Range   Salicylate Lvl <4.1 (*) 2.8 - 20.0 mg/dL  CBG MONITORING, ED     Status: Abnormal   Collection Time    05/22/13  3:53 AM      Result Value Ref Range   Glucose-Capillary 327 (*) 70 - 99 mg/dL  CBG MONITORING, ED     Status: Abnormal   Collection Time    05/22/13  5:25 AM      Result Value Ref Range   Glucose-Capillary 202 (*) 70 - 99 mg/dL   Labs are reviewed and are pertinent for see medical notes.  Current Facility-Administered Medications  Medication Dose Route Frequency Provider Last Rate Last Dose  . acetaminophen (TYLENOL) tablet 650 mg  650 mg Oral Q4H PRN Alvina Chou, PA-C      . alum & mag hydroxide-simeth (MAALOX/MYLANTA) 200-200-20 MG/5ML suspension 30 mL  30 mL Oral PRN Alvina Chou, PA-C      . LORazepam (ATIVAN) tablet 1 mg  1 mg Oral Q8H PRN Kaitlyn Szekalski, PA-C      . nicotine (NICODERM CQ - dosed in mg/24 hours) patch 21 mg  21 mg Transdermal Daily Kaitlyn Szekalski, PA-C      . ondansetron (ZOFRAN) tablet  4 mg  4 mg Oral Q8H PRN Alvina Chou, PA-C      . zolpidem (AMBIEN) tablet 5 mg  5 mg Oral QHS PRN Alvina Chou, PA-C       Current Outpatient Prescriptions  Medication Sig Dispense Refill  . acetaminophen (TYLENOL) 325 MG tablet Take 650 mg by mouth every 6 (six) hours as needed for mild pain.      Marland Kitchen aspirin EC 81 MG tablet Take 81 mg by mouth daily.      Marland Kitchen atorvastatin (LIPITOR) 40 MG tablet Take 1 tablet (40 mg total) by mouth daily.  90 tablet  3  . clonazePAM (KLONOPIN) 1 MG tablet Take 1 tablet (1 mg total) by mouth 2 (two) times daily as needed for anxiety.  20 tablet  0  . diphenhydrAMINE (BENADRYL) 25 mg capsule Take 25 mg by mouth daily as needed for allergies.      Marland Kitchen insulin aspart (NOVOLOG) 100 UNIT/ML injection Inject 8 Units into the skin 3 (three) times daily with meals.      . insulin glargine (LANTUS) 100 UNIT/ML injection Inject 28 Units into the skin every evening.      . lidocaine (LIDODERM) 5 % Place 1 patch onto the skin daily as needed (for pain.). Remove & Discard patch within 12 hours or as directed by MD      . Melatonin-Pyridoxine (MELATIN PO) Take 1 tablet by mouth at bedtime.      Marland Kitchen oxyCODONE (OXY IR/ROXICODONE) 5 MG immediate release tablet Take 5 mg by mouth 3 (three) times daily as needed. For pain.      Marland Kitchen VALERIAN PO Take 3 tablets by mouth at bedtime.        Psychiatric Specialty Exam:     Blood pressure 98/58, pulse 73, temperature 97.7 F (36.5 C), temperature source Oral, resp. rate 16, height $RemoveBe'5\' 4"'xHhzSPBvr$  (1.626 m), weight 81.647 kg (180 lb), SpO2 100.00%.Body mass index is 30.88 kg/(m^2).  General Appearance: Casual  Eye Contact::  Fair  Speech:  Slow  Volume:  Decreased  Mood:  Depressed  Affect:  Congruent  Thought Process:  Coherent  Orientation:  Full (Time, Place, and Person)  Thought Content:  Rumination  Suicidal Thoughts:  Yes.  without intent/plan  Homicidal Thoughts:  No  Memory:  Recent;   Poor  Judgement:  Poor  Insight:   Lacking  Psychomotor Activity:  Decreased  Concentration:  Fair  Recall:  [place]t: Fair  Akathisia:  Negative  Handed:  Right  AIMS (if indicated):     Assets:  Leisure Time  Sleep:      Musculoskeletal: Strength & Muscle Tone: within normal limits Gait & Station: normal Patient leans: N/A  Treatment Plan Summary: Daily contact with patient to assess and evaluate symptoms and progress in treatment Medication management Admit for stabilization. Considering her depression will be to admit her for stabilization she is not feeling safe to go back home condition dose of relationship issues which has become a significant foreboding factor.  Merian Capron MD 05/22/2013 10:59 AM

## 2013-05-22 NOTE — ED Notes (Signed)
Pt states she is not surprised her blood sugar is up. She says stress plays a large role in her sugar. She has known she was diabetic since she was 11 and has always seen the trend with her stress level.

## 2013-05-22 NOTE — ED Notes (Signed)
Maureen [name], and Maureen [name] savage per Pt requests are the only people allowed to visit pt.

## 2013-05-22 NOTE — ED Notes (Signed)
Called to give report to move pt to secure area and was told the pt would have to wait until shift change.

## 2013-05-23 ENCOUNTER — Encounter (HOSPITAL_COMMUNITY): Payer: Self-pay | Admitting: *Deleted

## 2013-05-23 DIAGNOSIS — F332 Major depressive disorder, recurrent severe without psychotic features: Principal | ICD-10-CM

## 2013-05-23 DIAGNOSIS — F4310 Post-traumatic stress disorder, unspecified: Secondary | ICD-10-CM

## 2013-05-23 DIAGNOSIS — R45851 Suicidal ideations: Secondary | ICD-10-CM

## 2013-05-23 LAB — GLUCOSE, CAPILLARY
Glucose-Capillary: 233 mg/dL — ABNORMAL HIGH (ref 70–99)
Glucose-Capillary: 201 mg/dL — ABNORMAL HIGH (ref 70–99)
Glucose-Capillary: 44 mg/dL — CL (ref 70–99)
Glucose-Capillary: 47 mg/dL — ABNORMAL LOW (ref 70–99)
Glucose-Capillary: 85 mg/dL (ref 70–99)
Glucose-Capillary: 260 mg/dL — ABNORMAL HIGH (ref 70–99)

## 2013-05-23 LAB — HEMOGLOBIN A1C
Hgb A1c MFr Bld: 8.3 % — ABNORMAL HIGH (ref <5.7)
Mean Plasma Glucose: 192 mg/dL — ABNORMAL HIGH (ref <117)

## 2013-05-23 MED ORDER — GLUCERNA SHAKE PO LIQD: 237.0000 mL | Freq: Two times a day (BID) | ORAL | Status: DC

## 2013-05-23 MED ORDER — CITALOPRAM HYDROBROMIDE 20 MG PO TABS
20.0000 mg | ORAL_TABLET | Freq: Every day | ORAL | Status: DC
  Administered 2013-05-24 – 2013-05-25 (×2): 20 mg via ORAL
  Filled 2013-05-23 (×4): qty 1

## 2013-05-23 MED ORDER — GLUCOSE 4 G PO CHEW
7.0000 | CHEWABLE_TABLET | Freq: Once | ORAL | Status: AC
  Administered 2013-05-23: 28 g via ORAL
  Filled 2013-05-23: qty 7
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
## 2013-05-23 NOTE — [facility] Suicide Risk Assessment (Signed)
[place] INPATIENT:  Family/Significant Other Suicide Prevention Education  Suicide Prevention Education:  Education Completed; Ammie Dalton, Mother - [phone]; has been identified by the patient as the family member/significant other with whom the patient will be residing, and identified as the person(s) who will aid the patient in the event of a mental health crisis (suicidal ideations/suicide attempt).  With written consent from the patient, the family member/significant other has been provided the following suicide prevention education, prior to the and/or following the discharge of the patient.  The suicide prevention education provided includes the following:  Suicide risk factors  Suicide prevention and interventions  National Suicide Hotline telephone number  [facility] assessment telephone number  [facility] Emergency Assistance [place] and/or Residential Mobile Crisis Unit telephone number  Request made of family/significant other to:  Remove weapons (e.g., guns, rifles, knives), all items previously/currently identified as safety concern.  Mother advised patient does not have access to weapons.     Remove drugs/medications (over-the-counter, prescriptions, illicit drugs), all items previously/currently identified as a safety concern.  The family member/significant other verbalizes understanding of the suicide prevention education information provided.  The family member/significant other agrees to remove the items of safety concern listed above.  Jolynn Bajorek Hairston Shomari Matusik 05/23/2013, 3:37 PM
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
## 2013-05-23 NOTE — Progress Notes (Signed)
Hypoglycemic Event  CBG: 44  Treatment: 3 glucose tabs and 15 GM carbohydrate snack  Symptoms: None  Follow-up CBG: Time:1740 CBG Result:85  Possible Reasons for Event: Change in activity  Comments/MD notified: PA informed and instructions given nurse.      Cammy Copa  Remember to initiate Hypoglycemia Order Set & complete

## 2013-05-23 NOTE — Progress Notes (Signed)
Adult Psychoeducational Group Note  Date:  05/23/2013 Time:  9:49 PM  Group Topic/Focus:  Wrap-Up Group:   The focus of this group is to help patients review their daily goal of treatment and discuss progress on daily workbooks.  Participation Level:  Active  Participation Quality:  Appropriate  Affect:  Appropriate  Cognitive:  Alert and Oriented  Insight: Appropriate  Engagement in Group:  Developing/Improving  Modes of Intervention:  Exploration  Additional Comments:  Patient rated her day as a 62.  Patient stated that her positive for today is that she was able to see her sons. Patient stated that she learned from her recovery group that she cat do everything for everybody and to learn to ask for help. Patient stated that she plans on picking up the phone when she is overwhelmed and that she is going to learn to put herself first.  Edsel Petrin 05/23/2013, 9:49 PM

## 2013-05-23 NOTE — Progress Notes (Signed)
D:  Patient's self inventory sheet, patient needs sleep medication, improving appetite, normal energy level, improving attention span.  Rated depression and hopeless 8, anxiety 9.  Denied withdrawals.  SI off/on, contracts for safety.  In the past 24 hours, has experienced pain, headaches.  Wants to feel better, worst pain #7.  After discharge, plans to do more exercise.  Better scheduling, follow ups.  Plans to return home after discharge.  No problems taking medications after discharge. A:  Medications administered per MD orders.  Emotional support and encouragement given patient. R:  Denied HI.  Denied A/V hallucinations.  SI off/on, contracts for safety.  Will continue to monitor patient for safety with 15 minute checks.  Safety maintained.

## 2013-05-23 NOTE — Progress Notes (Signed)
The focus of this group is to educate the patient on the purpose and policies of crisis stabilization and provide a format to answer questions about their admission.  The group details unit policies and expectations of patients while admitted.  Patient attended 0900 nurse education orientation group this morning.  Patient actively participated, appropriate affect, alert, appropriate insight and engagement.  Today patient will work on 3 goals for discharge.  

## 2013-05-23 NOTE — Progress Notes (Signed)
1745   Patient went outside for recreation, came back to unit.  CBG 44, denied symptoms.  PA JW informed.  Instructed nurse to give orange juice and glucose tabs which were done by nurse.    1745  CBG came up to 85.  PA JW informed.  Patient stated she felt good, went to cafeteria for dinner with her family which came back to 500 hall to visit patient after dinner.   Patient has been doing well, no problems voiced.  Enjoyed visiting her family.
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
## 2013-05-23 NOTE — BHH Group Notes (Signed)
[place] LCSW Group Therapy  Emotional Regulation 1:15 - 2: 30 PM        05/23/2013     Type of Therapy:  Group Therapy  Participation Level:  Appropriate  Participation Quality:  Appropriate  Affect:  Appropriate  Cognitive:  Attentive Appropriate  Insight:  Developing/Improving Engaged  Engagement in Therapy:  Developing/Improving Engaged  Modes of Intervention:  Discussion Exploration Problem-Solving Supportive  Summary of Progress/Problems:  Group topic was emotional regulations.  Patient participated in the discussion and was able to identify an emotion that needed to regulated.  Patient was able to identify approprite coping skills.  She shared she understands that having a mental health diagnosis does not define who she is as a person.  Maureen [name] 05/23/2013
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
## 2013-05-23 NOTE — Progress Notes (Signed)
Recreation Therapy Notes  Animal-Assisted Activity/Therapy (AAA/T) Program Checklist/Progress Notes Patient Eligibility Criteria Checklist & Daily Group note for Rec Tx Intervention  Date: 05.05.2015 Time: 2:45pm Location: [facility]    AAA/T Program Assumption of Risk Form signed by Patient/ or Parent Legal Guardian yes  Patient is free of allergies or sever asthma yes  Patient reports no fear of animals yes  Patient reports no history of cruelty to animals yes   Patient understands his/her participation is voluntary yes  Patient washes hands before animal contact yes  Patient washes hands after animal contact yes  Behavioral Response: Appropriate   Education: Hand Washing, Appropriate Animal Interaction   Education Outcome: Acknowledges understanding   Clinical Observations/Feedback: Patient interacted appropriately with therapeutic dog team, peers and staff during session.  Patient shared stories about her personal pets with group members.   Kirk Sampley L Elzora Cullins, LRT/CTRS  Dayne Chait L Shyniece Scripter 05/23/2013 5:06 PM

## 2013-05-23 NOTE — Tx Team (Signed)
Initial Interdisciplinary Treatment Plan  PATIENT STRENGTHS: (choose at least two) Ability for insight Active sense of humor Average or above average intelligence Financial means General fund of knowledge Motivation for treatment/growth Supportive family/friends  PATIENT STRESSORS: Marital or family conflict   PROBLEM LIST: Problem List/Patient Goals Date to be addressed Date deferred Reason deferred Estimated date of resolution  Increased risk for SI 5/4     Depression 5/4     Anxiety 5/4                                          DISCHARGE CRITERIA:  Improved stabilization in mood, thinking, and/or behavior Motivation to continue treatment in a less acute level of care Need for constant or close observation no longer present Reduction of life-threatening or endangering symptoms to within safe limits Verbal commitment to aftercare and medication compliance  PRELIMINARY DISCHARGE PLAN: Attend PHP/IOP Outpatient therapy  PATIENT/FAMIILY INVOLVEMENT: This treatment plan has been presented to and reviewed with the patient, Maureen [name].  The patient and family have been given the opportunity to ask questions and make suggestions.  Maureen [name] 05/23/2013, 3:17 AM

## 2013-05-24 LAB — GLUCOSE, CAPILLARY
Glucose-Capillary: 256 mg/dL — ABNORMAL HIGH (ref 70–99)
Glucose-Capillary: 114 mg/dL — ABNORMAL HIGH (ref 70–99)
Glucose-Capillary: 119 mg/dL — ABNORMAL HIGH (ref 70–99)
Glucose-Capillary: 118 mg/dL — ABNORMAL HIGH (ref 70–99)

## 2013-05-24 MED ORDER — INSULIN ASPART 100 UNIT/ML ~~LOC~~ SOLN
8.0000 [IU] | Freq: Three times a day (TID) | SUBCUTANEOUS | Status: DC
  Administered 2013-05-24 – 2013-05-26 (×6): 8 [IU] via SUBCUTANEOUS

## 2013-05-24 MED ORDER — INSULIN ASPART 100 UNIT/ML ~~LOC~~ SOLN
0.0000 [IU] | Freq: Three times a day (TID) | SUBCUTANEOUS | Status: DC
  Administered 2013-05-25: 3 [IU] via SUBCUTANEOUS
  Administered 2013-05-25: 2 [IU] via SUBCUTANEOUS

## 2013-05-24 MED ORDER — INSULIN GLARGINE 100 UNIT/ML ~~LOC~~ SOLN
28.0000 [IU] | Freq: Every day | SUBCUTANEOUS | Status: DC
Start: 2013-05-24 — End: 2013-05-26
  Administered 2013-05-24 – 2013-05-25 (×2): 28 [IU] via SUBCUTANEOUS

## 2013-05-24 NOTE — Progress Notes (Signed)
Patient ID: ISIDORA LAHAM, female   DOB: 1970/02/27, 43 y.o.   MRN: [id number]  D: Pt. Denies SI/HI and A/V Hallucinations. Patient rates her depression at 8/10 and her hopelessness at 7/10 for the day. Patient is experiencing ongoing pain in abdomen but reports that PRN Oxy helped her this morning and the pain level decreased. Patient reports that he mother is causing her anxiety to rise but reports she is going to focus on herself while here. Patient received PRN Klonopin and reported relief on reassessment.  A: Support and encouragement provided to the patient. Scheduled medications administered to patient per physician's orders.  R: Patient is receptive and cooperative. Patient is seen in the milieu and is attending groups. Q15 minute checks are maintained for safety.

## 2013-05-24 NOTE — Progress Notes (Signed)
[facility] MD Progress Note  05/24/2013 1:37 PM Maureen [name]  MRN:  [id number]  Subjective: Patient says she is feeling better today, and feels that her blood sugars being under better control and helps. Her son came and ate lunch with her today. She is anticipating seeing him graduate from early college. She is slowly working her way through the mess with her husband. She continue to endorse symptoms of depression, anxiety and feels insecure out side the hospital. She is attending the groups and learning the coping skills.   Diagnosis:   DSM5: Schizophrenia Disorders:   Obsessive-Compulsive Disorders:   Trauma-Stressor Disorders:   Substance/Addictive Disorders:   Depressive Disorders:   Total Time spent with patient: 30 minutes  Axis l. MDD severe single episode  ADL's:  Intact  Sleep: Good  Appetite:  Good  Suicidal Ideation:  denies Homicidal Ideation:  denies AEB (as evidenced by):  Psychiatric Specialty Exam: Physical Exam  ROS  Blood pressure 119/73, pulse 80, temperature 98.2 F (36.8 C), temperature source Oral, resp. rate 18, height 5\' 4"  (1.626 m), weight 80.06 kg (176 lb 8 oz).Body mass index is 30.28 kg/(m^2).  General Appearance: Fairly Groomed  Engineer, water::  Good  Speech:  Clear and Coherent  Volume:  Normal  Mood:  Depressed  Affect:  Congruent  Thought Process:  Goal Directed  Orientation:  Full (Time, Place, and Person)  Thought Content:  WDL  Suicidal Thoughts:  No  Homicidal Thoughts:  No  Memory:  NA  Judgement:  Fair  Insight:  Present  Psychomotor Activity:  Normal  Concentration:  Fair  Recall:  Glade of Knowledge:Good  Language: Good  Akathisia:  No  Handed:  Right  AIMS (if indicated):     Assets:  Communication Skills Desire for Improvement Housing  Sleep:  Number of Hours: 6   Musculoskeletal: Strength & Muscle Tone: within normal limits Gait & Station: normal Patient leans: N/A  Current Medications: Current  Facility-Administered Medications  Medication Dose Route Frequency Provider Last Rate Last Dose  . acetaminophen (TYLENOL) tablet 650 mg  650 mg Oral Q6H PRN Laverle Hobby, PA-C      . alum & mag hydroxide-simeth (MAALOX/MYLANTA) 200-200-20 MG/5ML suspension 30 mL  30 mL Oral Q4H PRN Laverle Hobby, PA-C      . aspirin EC tablet 81 mg  81 mg Oral Daily Waylan Boga, NP   81 mg at 05/24/13 0853  . atorvastatin (LIPITOR) tablet 40 mg  40 mg Oral Daily Laverle Hobby, PA-C   40 mg at 05/24/13 0853  . citalopram (CELEXA) tablet 20 mg  20 mg Oral Daily Durward Parcel, MD   20 mg at 05/24/13 0853  . clonazePAM (KLONOPIN) tablet 1 mg  1 mg Oral BID PRN Laverle Hobby, PA-C   1 mg at 05/24/13 0855  . diphenhydrAMINE (BENADRYL) capsule 25 mg  25 mg Oral Daily PRN Laverle Hobby, PA-C      . feeding supplement (GLUCERNA SHAKE) (GLUCERNA SHAKE) liquid 237 mL  237 mL Oral BID BM Christie Beckers, RD      . hydrOXYzine (ATARAX/VISTARIL) tablet 25 mg  25 mg Oral Q6H PRN Laverle Hobby, PA-C      . insulin aspart (novoLOG) injection 0-15 Units  0-15 Units Subcutaneous TID WC Nena Polio, PA-C      . insulin aspart (novoLOG) injection 8 Units  8 Units Subcutaneous TID WC Nena Polio, PA-C   8 Units at  05/24/13 1215  . insulin glargine (LANTUS) injection 28 Units  28 Units Subcutaneous QHS Nena Polio, PA-C      . lidocaine (LIDODERM) 5 % 1 patch  1 patch Transdermal Daily PRN Laverle Hobby, PA-C      . magnesium hydroxide (MILK OF MAGNESIA) suspension 30 mL  30 mL Oral Daily PRN Laverle Hobby, PA-C      . nicotine (NICODERM CQ - dosed in mg/24 hours) patch 21 mg  21 mg Transdermal Daily Waylan Boga, NP      . oxyCODONE (Oxy IR/ROXICODONE) immediate release tablet 5 mg  5 mg Oral TID PRN Laverle Hobby, PA-C   5 mg at 05/24/13 0855  . traZODone (DESYREL) tablet 50 mg  50 mg Oral QHS,MR X 1 Laverle Hobby, PA-C   50 mg at 05/23/13 2239    Lab Results:  Results for orders placed  during the hospital encounter of 05/22/13 (from the past 48 hour(s))  GLUCOSE, CAPILLARY     Status: Abnormal   Collection Time    05/22/13 10:30 PM      Result Value Ref Range   Glucose-Capillary 61 (*) 70 - 99 mg/dL   Comment 1 Notify RN    GLUCOSE, CAPILLARY     Status: Abnormal   Collection Time    05/22/13 10:46 PM      Result Value Ref Range   Glucose-Capillary 64 (*) 70 - 99 mg/dL   Comment 1 Notify RN    GLUCOSE, CAPILLARY     Status: None   Collection Time    05/22/13 11:15 PM      Result Value Ref Range   Glucose-Capillary 79  70 - 99 mg/dL  HEMOGLOBIN A1C     Status: Abnormal   Collection Time    05/23/13  6:10 AM      Result Value Ref Range   Hemoglobin A1C 8.3 (*) <5.7 %   Comment: (NOTE)                                                                               According to the ADA Clinical Practice Recommendations for 2011, when     HbA1c is used as a screening test:      >=6.5%   Diagnostic of Diabetes Mellitus               (if abnormal result is confirmed)     5.7-6.4%   Increased risk of developing Diabetes Mellitus     References:Diagnosis and Classification of Diabetes Mellitus,Diabetes     QQPY,1950,93(OIZTI 1):S62-S69 and Standards of Medical Care in             Diabetes - 2011,Diabetes Care,2011,34 (Suppl 1):S11-S61.   Mean Plasma Glucose 192 (*) <117 mg/dL   Comment: Performed at [place], CAPILLARY     Status: Abnormal   Collection Time    05/23/13  6:13 AM      Result Value Ref Range   Glucose-Capillary 233 (*) 70 - 99 mg/dL   Comment 1 Notify RN    GLUCOSE, CAPILLARY     Status: Abnormal   Collection Time    05/23/13 12:00 PM  Result Value Ref Range   Glucose-Capillary 201 (*) 70 - 99 mg/dL   Comment 1 Notify RN    GLUCOSE, CAPILLARY     Status: Abnormal   Collection Time    05/23/13  4:54 PM      Result Value Ref Range   Glucose-Capillary 47 (*) 70 - 99 mg/dL   Comment 1 Notify RN    GLUCOSE, CAPILLARY      Status: Abnormal   Collection Time    05/23/13  5:16 PM      Result Value Ref Range   Glucose-Capillary 44 (*) 70 - 99 mg/dL  GLUCOSE, CAPILLARY     Status: None   Collection Time    05/23/13  5:42 PM      Result Value Ref Range   Glucose-Capillary 85  70 - 99 mg/dL  GLUCOSE, CAPILLARY     Status: Abnormal   Collection Time    05/23/13  8:49 PM      Result Value Ref Range   Glucose-Capillary 260 (*) 70 - 99 mg/dL   Comment 1 Notify RN     Comment 2 Documented in Chart    GLUCOSE, CAPILLARY     Status: Abnormal   Collection Time    05/24/13  6:13 AM      Result Value Ref Range   Glucose-Capillary 256 (*) 70 - 99 mg/dL  GLUCOSE, CAPILLARY     Status: Abnormal   Collection Time    05/24/13 12:13 PM      Result Value Ref Range   Glucose-Capillary 114 (*) 70 - 99 mg/dL    Physical Findings: AIMS: Facial and Oral Movements Muscles of Facial Expression: None, normal Lips and Perioral Area: None, normal Jaw: None, normal Tongue: None, normal,Extremity Movements Upper (arms, wrists, hands, fingers): None, normal Lower (legs, knees, ankles, toes): None, normal, Trunk Movements Neck, shoulders, hips: None, normal, Overall Severity Severity of abnormal movements (highest score from questions above): None, normal Incapacitation due to abnormal movements: None, normal Patient's awareness of abnormal movements (rate only patient's report): No Awareness, Dental Status Current problems with teeth and/or dentures?: No Does patient usually wear dentures?: No  CIWA:  CIWA-Ar Total: 2 COWS:  COWS Total Score: 3  Treatment Plan Summary: Daily contact with patient to assess and evaluate symptoms and progress in treatment Medication management  Plan: 1. Continue crisis management and stabilization. 2. Medication management to reduce current symptoms to base line and improve the patient's overall level of functioning. 3. Treat health problems as indicated. 4. Develop treatment plan to  decrease risk of relapse upon discharge and to reduce the need for readmission. 5. Psycho-social education regarding relapse prevention and self care. 6. Health care follow up as needed for medical problems. 7. Restart home medications where appropriate. 8. Disposition in progress.  Medical Decision Making Problem Points:  Established problem, stable/improving (1) Data Points:  Review of medication regiment & side effects (2)  I certify that inpatient services furnished can reasonably be expected to improve the patient's condition.   Marlane Hatcher. [place] RPAC 1:54 PM 05/24/2013  Reviewed the information documented and agree with the treatment plan.  Parke Simmers Elio Haden 05/24/2013 3:00 PM

## 2013-05-24 NOTE — [facility] Group Notes (Signed)
Adult Psychoeducational Group Note  Date:  05/24/2013 Time:  9:23 PM  Group Topic/Focus:  Wrap-Up Group:   The focus of this group is to help patients review their daily goal of treatment and discuss progress on daily workbooks.   Participation Level:  Active  Participation Quality:  Appropriate, Attentive, Sharing and Supportive  Affect:  Appropriate  Cognitive:  Alert and Appropriate  Insight: Appropriate  Engagement in Group:  Engaged  Modes of Intervention:  Discussion, Education, Exploration and Support  Additional Comments:  She stated that her goal was to keep her blood sugar leveled off. She stated that she achieved that goal. She stated that she wanted to be more open with her family about the troubles and the stress she is facing . She stated that ways she has learned to be more open with her family and to communicate with them more effectively, was by writing down things and literally showing her family what she has wrote. 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  Jillann Charette L Nur Rabold 05/24/2013, 9:23 PM

## 2013-05-24 NOTE — Progress Notes (Signed)
D: pt stated today was a good day. Pt feels as though treatment is working and she is nowhere near the low point she was at on admission. Pt was able to have a good conversations with her husband and he used a lot of affimations and words of encouragement with her today. denies si/hi/avh. Rates pain 7/10 in abdomen. Pt is cooperative on unit. depressed affect.  A: lidocaine patch given for pain. scheduled  medications given. Support and encouragement offered. q 15 min safety checks R: pt remains safe on unit. No signs of distress noted
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
## 2013-05-24 NOTE — Progress Notes (Signed)
D: pt stated, " i got to see my sons today, so seeing them outweighs everything and made my day." denies si/hi/avh. Rates pain 7/10 abdomen. Pt is calm and cooperative. Pt not wanting Glucerna shake because glucose levels were in the 200s.  A: support and encouragement offered. q 15 min safety checks. scheduled medications given R: pt remains safe on unit. No signs of distress noted

## 2013-05-25 DIAGNOSIS — F322 Major depressive disorder, single episode, severe without psychotic features: Secondary | ICD-10-CM

## 2013-05-25 LAB — GLUCOSE, CAPILLARY
Glucose-Capillary: 184 mg/dL — ABNORMAL HIGH (ref 70–99)
Glucose-Capillary: 133 mg/dL — ABNORMAL HIGH (ref 70–99)
Glucose-Capillary: 92 mg/dL (ref 70–99)
Glucose-Capillary: 108 mg/dL — ABNORMAL HIGH (ref 70–99)

## 2013-05-25 MED ORDER — CLONAZEPAM 0.5 MG PO TABS
0.5000 mg | ORAL_TABLET | Freq: Two times a day (BID) | ORAL | Status: DC
  Administered 2013-05-25 – 2013-05-26 (×2): 0.5 mg via ORAL
  Filled 2013-05-25 (×2): qty 1

## 2013-05-25 MED ORDER — CITALOPRAM HYDROBROMIDE 40 MG PO TABS
40.0000 mg | ORAL_TABLET | Freq: Every day | ORAL | Status: DC
  Administered 2013-05-26: 40 mg via ORAL
  Filled 2013-05-25: qty 1
  Filled 2013-05-25: qty 4
  Filled 2013-05-25: qty 1

## 2013-05-25 MED ORDER — CITALOPRAM HYDROBROMIDE 20 MG PO TABS
20.0000 mg | ORAL_TABLET | Freq: Once | ORAL | Status: DC
  Administered 2013-05-25: 20 mg via ORAL
  Filled 2013-05-25 (×2): qty 1

## 2013-05-25 NOTE — Progress Notes (Signed)
Patient ID: Maureen [name], female   DOB: [birth year], 43 y.o.   MRN: [id number] [facility] MD Progress Note  05/25/2013 1:51 PM Maureen [name]  MRN:  [id number]  Subjective: Patient has been compliant with her medication management and therapeutic and milieu activities. She has been doing much better now but says she is upset and agree with Volney American who threatened me and she knows a lot about me and my family because my husband told her. Reportedly she is stalking patient and she was charged and has court date in June 20, 2013. She feels that she needs to appear. Her son and husband has been supportive to her and coming to most of the visits during lunch and supper daily. She is anticipating seeing him graduate from early college. She is slowly working her way through the mess with her husband and concern that she can work out things with counseling. She has a little hopeless, suicidal thought but no intention or homicidal thoughts. She continue to endorse symptoms of depression which rates 3/10, anxiety 3-9 / 10and feels insecure out side the hospital. She is attending the groups and learning the coping skills.   Diagnosis:   DSM5: Schizophrenia Disorders:   Obsessive-Compulsive Disorders:   Trauma-Stressor Disorders:   Substance/Addictive Disorders:   Depressive Disorders:   Total Time spent with patient: 30 minutes  Axis l. MDD, severe, single episode  ADL's:  Intact  Sleep: Good  Appetite:  Good  Suicidal Ideation:  Suicidal thoughts and denied intention or plans. Homicidal Ideation:  denies AEB (as evidenced by):  Psychiatric Specialty Exam: Physical Exam  ROS  Blood pressure 104/68, pulse 75, temperature 97.5 F (36.4 C), temperature source Oral, resp. rate 20, height 5\' 4"  (1.626 m), weight 80.06 kg (176 lb 8 oz).Body mass index is 30.28 kg/(m^2).  General Appearance: Fairly Groomed  Engineer, water::  Good  Speech:  Clear and Coherent  Volume:  Normal  Mood:  Depressed   Affect:  Congruent  Thought Process:  Goal Directed  Orientation:  Full (Time, Place, and Person)  Thought Content:  WDL  Suicidal Thoughts:  No  Homicidal Thoughts:  No  Memory:  NA  Judgement:  Fair  Insight:  Present  Psychomotor Activity:  Normal  Concentration:  Fair  Recall:  [place] of Knowledge:Good  Language: Good  Akathisia:  No  Handed:  Right  AIMS (if indicated):     Assets:  Communication Skills Desire for Improvement Housing  Sleep:  Number of Hours: 6   Musculoskeletal: Strength & Muscle Tone: within normal limits Gait & Station: normal Patient leans: N/A  Current Medications: Current Facility-Administered Medications  Medication Dose Route Frequency Provider Last Rate Last Dose  . acetaminophen (TYLENOL) tablet 650 mg  650 mg Oral Q6H PRN Laverle Hobby, PA-C      . alum & mag hydroxide-simeth (MAALOX/MYLANTA) 200-200-20 MG/5ML suspension 30 mL  30 mL Oral Q4H PRN Laverle Hobby, PA-C      . aspirin EC tablet 81 mg  81 mg Oral Daily Waylan Boga, NP   81 mg at 05/24/13 0853  . atorvastatin (LIPITOR) tablet 40 mg  40 mg Oral Daily Laverle Hobby, PA-C   40 mg at 05/24/13 0853  . citalopram (CELEXA) tablet 20 mg  20 mg Oral Daily Durward Parcel, MD   20 mg at 05/24/13 0853  . clonazePAM (KLONOPIN) tablet 1 mg  1 mg Oral BID PRN Laverle Hobby, PA-C  1 mg at 05/24/13 0855  . diphenhydrAMINE (BENADRYL) capsule 25 mg  25 mg Oral Daily PRN Laverle Hobby, PA-C      . feeding supplement (GLUCERNA SHAKE) (GLUCERNA SHAKE) liquid 237 mL  237 mL Oral BID BM Christie Beckers, RD      . hydrOXYzine (ATARAX/VISTARIL) tablet 25 mg  25 mg Oral Q6H PRN Laverle Hobby, PA-C      . insulin aspart (novoLOG) injection 0-15 Units  0-15 Units Subcutaneous TID WC Nena Polio, PA-C      . insulin aspart (novoLOG) injection 8 Units  8 Units Subcutaneous TID WC Nena Polio, PA-C   8 Units at 05/24/13 1215  . insulin glargine (LANTUS) injection 28 Units  28  Units Subcutaneous QHS Nena Polio, PA-C      . lidocaine (LIDODERM) 5 % 1 patch  1 patch Transdermal Daily PRN Laverle Hobby, PA-C      . magnesium hydroxide (MILK OF MAGNESIA) suspension 30 mL  30 mL Oral Daily PRN Laverle Hobby, PA-C      . nicotine (NICODERM CQ - dosed in mg/24 hours) patch 21 mg  21 mg Transdermal Daily Waylan Boga, NP      . oxyCODONE (Oxy IR/ROXICODONE) immediate release tablet 5 mg  5 mg Oral TID PRN Laverle Hobby, PA-C   5 mg at 05/24/13 0855  . traZODone (DESYREL) tablet 50 mg  50 mg Oral QHS,MR X 1 Laverle Hobby, PA-C   50 mg at 05/23/13 2239    Lab Results:  Results for orders placed during the hospital encounter of 05/22/13 (from the past 48 hour(s))  GLUCOSE, CAPILLARY     Status: Abnormal   Collection Time    05/22/13 10:30 PM      Result Value Ref Range   Glucose-Capillary 61 (*) 70 - 99 mg/dL   Comment 1 Notify RN    GLUCOSE, CAPILLARY     Status: Abnormal   Collection Time    05/22/13 10:46 PM      Result Value Ref Range   Glucose-Capillary 64 (*) 70 - 99 mg/dL   Comment 1 Notify RN    GLUCOSE, CAPILLARY     Status: None   Collection Time    05/22/13 11:15 PM      Result Value Ref Range   Glucose-Capillary 79  70 - 99 mg/dL  HEMOGLOBIN A1C     Status: Abnormal   Collection Time    05/23/13  6:10 AM      Result Value Ref Range   Hemoglobin A1C 8.3 (*) <5.7 %   Comment: (NOTE)                                                                               According to the ADA Clinical Practice Recommendations for 2011, when     HbA1c is used as a screening test:      >=6.5%   Diagnostic of Diabetes Mellitus               (if abnormal result is confirmed)     5.7-6.4%   Increased risk of developing Diabetes Mellitus     References:Diagnosis and Classification  of Diabetes Mellitus,Diabetes     DVVO,1607,37(TGGYI 1):S62-S69 and Standards of Medical Care in             Diabetes - 2011,Diabetes RSWN,4627,03 (Suppl 1):S11-S61.   Mean  Plasma Glucose 192 (*) <117 mg/dL   Comment: Performed at [place], CAPILLARY     Status: Abnormal   Collection Time    05/23/13  6:13 AM      Result Value Ref Range   Glucose-Capillary 233 (*) 70 - 99 mg/dL   Comment 1 Notify RN    GLUCOSE, CAPILLARY     Status: Abnormal   Collection Time    05/23/13 12:00 PM      Result Value Ref Range   Glucose-Capillary 201 (*) 70 - 99 mg/dL   Comment 1 Notify RN    GLUCOSE, CAPILLARY     Status: Abnormal   Collection Time    05/23/13  4:54 PM      Result Value Ref Range   Glucose-Capillary 47 (*) 70 - 99 mg/dL   Comment 1 Notify RN    GLUCOSE, CAPILLARY     Status: Abnormal   Collection Time    05/23/13  5:16 PM      Result Value Ref Range   Glucose-Capillary 44 (*) 70 - 99 mg/dL  GLUCOSE, CAPILLARY     Status: None   Collection Time    05/23/13  5:42 PM      Result Value Ref Range   Glucose-Capillary 85  70 - 99 mg/dL  GLUCOSE, CAPILLARY     Status: Abnormal   Collection Time    05/23/13  8:49 PM      Result Value Ref Range   Glucose-Capillary 260 (*) 70 - 99 mg/dL   Comment 1 Notify RN     Comment 2 Documented in Chart    GLUCOSE, CAPILLARY     Status: Abnormal   Collection Time    05/24/13  6:13 AM      Result Value Ref Range   Glucose-Capillary 256 (*) 70 - 99 mg/dL  GLUCOSE, CAPILLARY     Status: Abnormal   Collection Time    05/24/13 12:13 PM      Result Value Ref Range   Glucose-Capillary 114 (*) 70 - 99 mg/dL    Physical Findings: AIMS: Facial and Oral Movements Muscles of Facial Expression: None, normal Lips and Perioral Area: None, normal Jaw: None, normal Tongue: None, normal,Extremity Movements Upper (arms, wrists, hands, fingers): None, normal Lower (legs, knees, ankles, toes): None, normal, Trunk Movements Neck, shoulders, hips: None, normal, Overall Severity Severity of abnormal movements (highest score from questions above): None, normal Incapacitation due to abnormal movements:  None, normal Patient's awareness of abnormal movements (rate only patient's report): No Awareness, Dental Status Current problems with teeth and/or dentures?: No Does patient usually wear dentures?: No  CIWA:  CIWA-Ar Total: 2 COWS:  COWS Total Score: 3  Treatment Plan Summary: Daily contact with patient to assess and evaluate symptoms and progress in treatment Medication management  Plan: 1. Continue crisis management and stabilization. 2. Medication management to reduce current symptoms to base line and improve the patient's overall level of functioning. Increase Celexa 40 mg PO Qd Change klonopin 0.5 mg PO BID Continue Trazodone 50 mg PO Qhs 3. Treat health problems as indicated. 4. Develop treatment plan to decrease risk of relapse upon discharge and to reduce the need for readmission. 5. Psycho-social education regarding relapse prevention and self care. 6.  Health care follow up as needed for medical problems. 7. Restart home medications where appropriate. 8. Disposition in progress and may discharge tomorrow is she feels safe and clinically improves.  Medical Decision Making Problem Points:  Established problem, stable/improving (1) Data Points:  Review of medication regiment & side effects (2)  I certify that inpatient services furnished can reasonably be expected to improve the patient's condition.   Parke Simmers Tate Zagal 05/25/2013 1:51 PM

## 2013-05-25 NOTE — B[facility] Group Notes (Signed)
[place] LCSW Group Therapy  05/25/2013  1:15 PM   Type of Therapy:  Group Therapy  Participation Level:  Active  Participation Quality:  Attentive, Sharing and Supportive  Affect:  Depressed and Flat  Cognitive:  Alert and Oriented  Insight:  Developing/Improving and Engaged  Engagement in Therapy:  Developing/Improving and Engaged  Modes of Intervention:  Clarification, Confrontation, Discussion, Education, Exploration, Limit-setting, Orientation, Problem-solving, Rapport Building, Art therapist, Socialization and Support  Summary of Progress/Problems: The topic for group was balance in life.  Today's group focused on defining balance in one's own words, identifying things that can knock one off balance, and exploring healthy ways to maintain balance in life. Group members were asked to provide an example of a time when they felt off balance, describe how they handled that situation,and process healthier ways to regain balance in the future. Group members were asked to share the most important tool for maintaining balance that they learned while at [facility] and how they plan to apply this method after discharge.  Pt shared that her life was thrown off balance when she there was a crisis in her marriage.  Pt explained that she additional issues kept happening surrounding this crisis, including her getting arrested, which pt states has never happened.  Pt states that she has learned she needs to focus on herself and put herself first and has accepted that her marriage may not work.  Pt was insightful and positive.  Pt actively participated and was engaged in group discussion.    Regan Lemming, LCSW 05/25/2013  2:57 PM

## 2013-05-25 NOTE — Progress Notes (Signed)
Patient ID: Maureen [name], female   DOB: October 01, 1970, 43 y.o.   MRN: [id number]  Morning Wellness Group 9 A.M.  The focus of this group is to educate the patient on the purpose and policies of crisis stabilization and provide a format to answer questions about their admission.  The group details unit policies and expectations of patients while admitted. For this group nursing staff discussed the daily schedule and ways exercise and wellness can help the patient on their journey to recovery. Patients are able to identify a goal by the end of group.  Patient attended group and was engaged and appropriate. Patient states she is able to take what she learned from this group and use it when she goes home. Patient's goal for the day is to work on communication, "the right way."

## 2013-05-25 NOTE — Progress Notes (Signed)
Patient ID: Maureen [name], female   DOB: 1970-04-08, 43 y.o.   MRN: [id number]  D: Pt. Denies SI/HI and A/V Hallucinations. Patient rates her depression at 3/10 and her hopelessness at 4/10 for the day. Patient does have abdominal pain that is continuous but patient reports decrease in pain after receiving PRN Oxy. Patient also received relief from anxiety when given PRN Klonopin.  A: Support and encouragement provided to the patient. Scheduled medications administered to patient per physician's orders.   R: Patient is receptive and cooperative. Patient is seen in the milieu speaking with other patients and is attending groups. Patient reports that when she goes home she will take medication as prescribed Q15 minute checks are maintained for safety.

## 2013-05-26 LAB — GLUCOSE, CAPILLARY
Glucose-Capillary: 93 mg/dL (ref 70–99)
Glucose-Capillary: 101 mg/dL — ABNORMAL HIGH (ref 70–99)
Glucose-Capillary: 65 mg/dL — ABNORMAL LOW (ref 70–99)

## 2013-05-26 MED ORDER — TRAZODONE HCL 50 MG PO TABS
50.0000 mg | ORAL_TABLET | Freq: Every evening | ORAL | Status: DC | PRN
  Filled 2013-05-26: qty 4

## 2013-05-26 MED ORDER — ATORVASTATIN CALCIUM 40 MG PO TABS: 40.0000 mg | ORAL_TABLET | Freq: Every day | ORAL | Status: DC

## 2013-05-26 MED ORDER — OXYCODONE HCL 5 MG PO TABS: 5.0000 mg | ORAL_TABLET | Freq: Three times a day (TID) | ORAL | Status: DC | PRN

## 2013-05-26 MED ORDER — LIDOCAINE 5 % EX PTCH: 1.0000 | MEDICATED_PATCH | Freq: Every day | CUTANEOUS | Status: DC | PRN

## 2013-05-26 MED ORDER — CLONAZEPAM 0.5 MG PO TABS: 0.5000 mg | ORAL_TABLET | Freq: Two times a day (BID) | ORAL | Status: DC

## 2013-05-26 MED ORDER — CITALOPRAM HYDROBROMIDE 40 MG PO TABS: 40.0000 mg | ORAL_TABLET | Freq: Every day | ORAL | Status: DC

## 2013-05-26 MED ORDER — INSULIN GLARGINE 100 UNIT/ML ~~LOC~~ SOLN: 28.0000 [IU] | Freq: Every evening | SUBCUTANEOUS | Status: DC

## 2013-05-26 MED ORDER — INSULIN ASPART 100 UNIT/ML ~~LOC~~ SOLN: 8.0000 [IU] | Freq: Three times a day (TID) | SUBCUTANEOUS | Status: DC

## 2013-05-26 MED ORDER — TRAZODONE HCL 50 MG PO TABS: ORAL_TABLET | ORAL | Status: DC

## 2013-05-26 NOTE — Progress Notes (Signed)
[facility] Adult Case Management Discharge Plan :  Will you be returning to the same living situation after discharge: Yes,  Patient is returning to her home. At discharge, do you have transportation home?:Yes,  Patient to arrange transportation home. Do you have the ability to pay for your medications:Yes,  Patient is able to afford medications.  Release of information consent forms completed and in the chart;  Patient's signature needed at discharge.  Patient to Follow up at: Follow-up Information   Please follow up.   Contact information:          Follow up with [facility] On 06/13/2013. (Tuesday, Jun 13, 2013 at 3:00 PM.  Please arrive at 2:30 with completed registration form)    Contact information:   [place], [place]   [id number]  [phone]      Patient denies SI/HI:Patient will no longer endorse SI/other thought self harm   Safety Planning and Suicide Prevention discussed:  .Reviewed with all patients during discharge planning group  [place] 05/26/2013, 12:26 PM

## 2013-05-26 NOTE — Progress Notes (Signed)
Patient ID: Maureen [name], female   DOB: 09-Dec-1970, 43 y.o.   MRN: [id number] Patient discharged per physician order; patient denies SI/HI and A/V hallucinations; patient received samples, prescriptions, and copy of AVS after it was reviewed; patient had no other questions or concerns at this time; patient verbalized and signed that she received all belongings; patient left the unit ambulatory

## 2013-05-26 NOTE — Tx Team (Signed)
Interdisciplinary Treatment Plan Update   Date Reviewed:  05/26/2013  Time Reviewed:  8:36 AM  Progress in Treatment:   Attending groups: Yes Participating in groups: Yes Taking medication as prescribed: Yes  Tolerating medication: Yes Family/Significant other contact made:  Yes, collateral contact with mother Patient understands diagnosis: Yes  Discussing patient identified problems/goals with staff: Yes Medical problems stabilized or resolved: Yes Denies suicidal/homicidal ideation: Yes Patient has not harmed self or others: Yes  For review of initial/current patient goals, please see plan of care.  Estimated Length of Stay:  Discharge today  Reasons for Continued Hospitalization:   New Problems/Goals identified:    Discharge Plan or Barriers:   Home with outpatient follow up with  [facility].  Additional Comments:   Attendees:  Patient: Maureen [name] 05/26/2013 8:36 AM   Signature: Mylinda Latina, MD 05/26/2013 8:36 AM  Signature:  Nena Polio, PA 05/26/2013 8:36 AM  Signature:  Eduard Roux, RN 05/26/2013 8:36 AM  Signature:  Marilynne Halsted, RN 05/26/2013 8:36 AM  Signature:  05/26/2013 8:36 AM  Signature:  Joette Catching, LCSW 05/26/2013 8:36 AM  Signature:  Regan Lemming, LCSW 05/26/2013 8:36 AM  Signature:  Lucinda Dell, Care Coordinator [facility] 05/26/2013 8:36 AM  Signature:   05/26/2013 8:36 AM  Signature:   05/26/2013  8:36 AM  Signature:   Lars Pinks, RN [facility] 05/26/2013  8:36 AM  Signature: 05/26/2013  8:36 AM    Scribe for Treatment Team:   Joette Catching,  05/26/2013 8:36 AM

## 2013-05-26 NOTE — Discharge Summary (Signed)
Physician Discharge Summary Note  Patient:  Maureen [name] is an 43 y.o., female MRN:  [id number] DOB:  [birth year] Patient phone:  [phone] (home)  Patient address:   [address] [place] [address],  Total Time spent with patient: 30 minutes  Date of Admission:  05/22/2013 Date of Discharge: 05/26/2013  Reason for Admission:  Depression with SI  Discharge Diagnoses: Active Problems:   MDD (major depressive disorder)   Psychiatric Specialty Exam:  Please see D/C SRA Physical Exam  ROS  Blood pressure 118/78, pulse 74, temperature 97.9 F (36.6 C), temperature source Oral, resp. rate 18, height 5\' 4"  (1.626 m), weight 80.06 kg (176 lb 8 oz).Body mass index is 30.28 kg/(m^2).      Past Psychiatric History:  Diagnosis: Maj. Depressive disorder and posttraumatic stress disorder   Hospitalizations: None   Outpatient Care: None currently   Substance Abuse Care: Denied   Self-Mutilation: Denied   Suicidal Attempts: Denied   Violent Behaviors: None    DSM5:  Schizophrenia Disorders:   Obsessive-Compulsive Disorders:   Trauma-Stressor Disorders:   Substance/Addictive Disorders:   Depressive Disorders:    Axis Diagnosis:  Discharge Diagnoses:  AXIS I: Major Depression, Recurrent severe  AXIS II: Deferred  AXIS III:  Past Medical History   Diagnosis  Date   .  Diabetes mellitus    .  Fibromyalgia    .  Hyperlipidemia    .  Abdominal pain    .  Nausea & vomiting    .  Biliary dyskinesia    .  Depression    .  Joint pain    .  Skin rash      due to medications   .  Post traumatic stress disorder (PTSD)    .  Sleep trouble    .  Major depressive disorder    .  Cervical strain    .  Closed head injury  2010   .  Renal stones    .  Migraine      hx of none recent   .  Arthritis    .  Hypoglycemia     AXIS IV: other psychosocial or environmental problems, problems related to legal system/crime, problems related to social environment and problems  with primary support group  AXIS V: 61-70 mild symptoms     Level of Care:  OP  Hospital Course:  Maureen [name] is an 43 y.o. female, married, African-American female admitted voluntarily and emergently to [facility] with worsening symptoms of depression and anxiety and suicidal ideation. Patient has been diagnosed with major depressive disorder, recurrent and Posttraumatic Stress Disorder and was on treatment with medication management but is not currently receiving treatment.         Maureen [name] was admitted to the adult unit where she was evaluated and her symptoms were identified. Medication management was discussed and implemented. She was encouraged to participate in unit programming. Medical problems were identified and treated appropriately. Home medication was restarted as needed.                        Maureen [name] was evaluated each day by a clinical provider to ascertain the patient's response to treatment.  Improvement was noted by the patient's report of decreasing symptoms, improved sleep and appetite, affect, medication tolerance, behavior, and participation in unit programming.  The patient was asked each day to complete a self inventory noting mood, mental status, pain, new symptoms, anxiety  and concerns.         She responded well to medication and being in a therapeutic and supportive environment. Positive and appropriate behavior was noted and the patient was motivated for recovery.  Maureen [name] worked closely with the treatment team and case manager to develop a discharge plan with appropriate goals. Coping skills, problem solving as well as relaxation therapies were also part of the unit programming.         By the day of discharge Maureen [name] was in much improved condition than upon admission.  Symptoms were reported as significantly decreased or resolved completely.  The patient denied SI/HI and voiced no AVH. She was motivated to continue taking medication with a goal of continued  improvement in mental health.          Maureen [name] was discharged home with a plan to follow up as noted below. Consults:  None  Significant Diagnostic Studies:  None  Discharge Vitals:   Blood pressure 118/78, pulse 74, temperature 97.9 F (36.6 C), temperature source Oral, resp. rate 18, height 5\' 4"  (1.626 m), weight 80.06 kg (176 lb 8 oz). Body mass index is 30.28 kg/(m^2). Lab Results:   Results for orders placed during the hospital encounter of 05/22/13 (from the past 72 hour(s))  GLUCOSE, CAPILLARY     Status: Abnormal   Collection Time    05/23/13 12:00 PM      Result Value Ref Range   Glucose-Capillary 201 (*) 70 - 99 mg/dL   Comment 1 Notify RN    GLUCOSE, CAPILLARY     Status: Abnormal   Collection Time    05/23/13  4:54 PM      Result Value Ref Range   Glucose-Capillary 47 (*) 70 - 99 mg/dL   Comment 1 Notify RN    GLUCOSE, CAPILLARY     Status: Abnormal   Collection Time    05/23/13  5:16 PM      Result Value Ref Range   Glucose-Capillary 44 (*) 70 - 99 mg/dL  GLUCOSE, CAPILLARY     Status: None   Collection Time    05/23/13  5:42 PM      Result Value Ref Range   Glucose-Capillary 85  70 - 99 mg/dL  GLUCOSE, CAPILLARY     Status: Abnormal   Collection Time    05/23/13  8:49 PM      Result Value Ref Range   Glucose-Capillary 260 (*) 70 - 99 mg/dL   Comment 1 Notify RN     Comment 2 Documented in Chart    GLUCOSE, CAPILLARY     Status: Abnormal   Collection Time    05/24/13  6:13 AM      Result Value Ref Range   Glucose-Capillary 256 (*) 70 - 99 mg/dL  GLUCOSE, CAPILLARY     Status: Abnormal   Collection Time    05/24/13 12:13 PM      Result Value Ref Range   Glucose-Capillary 114 (*) 70 - 99 mg/dL  GLUCOSE, CAPILLARY     Status: Abnormal   Collection Time    05/24/13  4:51 PM      Result Value Ref Range   Glucose-Capillary 119 (*) 70 - 99 mg/dL   Comment 1 Notify RN    GLUCOSE, CAPILLARY     Status: Abnormal   Collection Time    05/24/13   7:50 PM      Result Value Ref Range   Glucose-Capillary 118 (*) 70 -  99 mg/dL   Comment 1 Notify RN     Comment 2 Documented in Chart    GLUCOSE, CAPILLARY     Status: Abnormal   Collection Time    05/25/13  6:21 AM      Result Value Ref Range   Glucose-Capillary 184 (*) 70 - 99 mg/dL  GLUCOSE, CAPILLARY     Status: Abnormal   Collection Time    05/25/13 12:04 PM      Result Value Ref Range   Glucose-Capillary 133 (*) 70 - 99 mg/dL   Comment 1 Notify RN    GLUCOSE, CAPILLARY     Status: None   Collection Time    05/25/13  4:53 PM      Result Value Ref Range   Glucose-Capillary 92  70 - 99 mg/dL   Comment 1 Notify RN    GLUCOSE, CAPILLARY     Status: Abnormal   Collection Time    05/25/13  7:58 PM      Result Value Ref Range   Glucose-Capillary 108 (*) 70 - 99 mg/dL  GLUCOSE, CAPILLARY     Status: None   Collection Time    05/26/13  6:19 AM      Result Value Ref Range   Glucose-Capillary 93  70 - 99 mg/dL    Physical Findings: AIMS: Facial and Oral Movements Muscles of Facial Expression: None, normal Lips and Perioral Area: None, normal Jaw: None, normal Tongue: None, normal,Extremity Movements Upper (arms, wrists, hands, fingers): None, normal Lower (legs, knees, ankles, toes): None, normal, Trunk Movements Neck, shoulders, hips: None, normal, Overall Severity Severity of abnormal movements (highest score from questions above): None, normal Incapacitation due to abnormal movements: None, normal Patient's awareness of abnormal movements (rate only patient's report): No Awareness, Dental Status Current problems with teeth and/or dentures?: No Does patient usually wear dentures?: No  CIWA:  CIWA-Ar Total: 2 COWS:  COWS Total Score: 3  Psychiatric Specialty Exam: See Psychiatric Specialty Exam and Suicide Risk Assessment completed by Attending Physician prior to discharge.  Discharge destination:  Home  Is patient on multiple antipsychotic therapies at discharge:   No   Has Patient had three or more failed trials of antipsychotic monotherapy by history:  No  Recommended Plan for Multiple Antipsychotic Therapies: NA  Discharge Orders   Future Orders Complete By Expires   Diet - low sodium heart healthy  As directed    Discharge instructions  As directed    Increase activity slowly  As directed        Medication List    STOP taking these medications       acetaminophen 325 MG tablet  Commonly known as:  TYLENOL     aspirin EC 81 MG tablet     diphenhydrAMINE 25 mg capsule  Commonly known as:  BENADRYL     MELATIN PO      TAKE these medications     Indication   atorvastatin 40 MG tablet  Commonly known as:  LIPITOR  Take 1 tablet (40 mg total) by mouth daily.   Indication:  Type II A Hyperlipidemia     citalopram 40 MG tablet  Commonly known as:  CELEXA  Take 1 tablet (40 mg total) by mouth daily.   Indication:  Depression     clonazePAM 0.5 MG tablet  Commonly known as:  KLONOPIN  Take 1 tablet (0.5 mg total) by mouth 2 (two) times daily.   Indication:  Panic Disorder  insulin aspart 100 UNIT/ML injection  Commonly known as:  novoLOG  Inject 8 Units into the skin 3 (three) times daily with meals.   Indication:  Type 2 Diabetes     insulin glargine 100 UNIT/ML injection  Commonly known as:  LANTUS  Inject 0.28 mLs (28 Units total) into the skin every evening.   Indication:  Type 2 Diabetes     lidocaine 5 %  Commonly known as:  LIDODERM  Place 1 patch onto the skin daily as needed (for pain.). Remove & Discard patch within 12 hours or as directed by MD      For pain   oxyCODONE 5 MG immediate release tablet  Commonly known as:  Oxy IR/ROXICODONE  Take 5 mg by mouth 3 (three) times daily as needed. For pain.     For pain   traZODone 50 MG tablet  Commonly known as:  DESYREL  Take one tablet at bedtime if needed for insomnia.   Indication:  Trouble Sleeping       Follow-up Information   Follow up with         [facility].   Contact information:   [place], [place]   [address]  [phone]      Follow-up recommendations:   Activities: Resume activity as tolerated. Diet: Heart healthy low sodium diet Tests: Follow up testing will be determined by your out patient provider. Comments:    Total Discharge Time:  Less than 30 minutes.  Signed: Nena Polio 05/26/2013, 11:10 AM  Patient was in face-to-face for psychiatric evaluation, suicide risk assessment and case discussed with the treatment team on the physician extender, make appropriate disposition plan. Reviewed the information documented and agree with the treatment plan.  Parke Simmers Vishruth Seoane 05/27/2013 2:19 PM

## 2013-05-26 NOTE — BHH Group Notes (Signed)
[facility] LCSW Aftercare Discharge Planning Group Note   05/26/2013 12:32 PM    Participation Quality:  Appropraite  Mood/Affect:  Appropriate  Depression Rating:  5  Anxiety Rating:  4  Thoughts of Suicide:  No  Will you contract for safety?   NA  Current AVH:  No  Plan for Discharge/Comments:  Patient attended discharge planning group and actively participated in group.  She will follow up with [facility].  CSW provided all participants with daily workbook.   Transportation Means: Patient has transportation.   Supports:  Patient has a support system.   Elridge Stemm, Eulas Post

## 2013-05-26 NOTE — BHH Suicide Risk Assessment (Signed)
   Demographic Factors:  Adolescent or young adult and Low socioeconomic status  Total Time spent with patient: 30 minutes  Psychiatric Specialty Exam: Physical Exam  ROS  Blood pressure 118/78, pulse 74, temperature 97.9 F (36.6 C), temperature source Oral, resp. rate 18, height 5\' 4"  (1.626 m), weight 80.06 kg (176 lb 8 oz).Body mass index is 30.28 kg/(m^2).  General Appearance: Casual  Eye Contact::  Good  Speech:  Clear and Coherent  Volume:  Normal  Mood:  Depressed  Affect:  Appropriate and Congruent  Thought Process:  Coherent and Goal Directed  Orientation:  Full (Time, Place, and Person)  Thought Content:  WDL  Suicidal Thoughts:  No  Homicidal Thoughts:  No  Memory:  Immediate;   Good Recent;   Good  Judgement:  Good  Insight:  Good  Psychomotor Activity:  Normal  Concentration:  Good  Recall:  Good  Fund of Knowledge:Good  Language: Good  Akathisia:  NA  Handed:  Right  AIMS (if indicated):     Assets:  Communication Skills Desire for Improvement Financial Resources/Insurance Housing Intimacy Leisure Time Resilience Social Support Talents/Skills  Sleep:  Number of Hours: 6.5    Musculoskeletal: Strength & Muscle Tone: within normal limits Gait & Station: normal Patient leans: N/A   Mental Status Per Nursing Assessment::   On Admission:  NA  Current Mental Status by Physician: NA  Loss Factors: Financial problems/change in socioeconomic status  Historical Factors: Family history of mental illness or substance abuse and Impulsivity  Risk Reduction Factors:   Sense of responsibility to family, Religious beliefs about death, Living with another person, especially a relative, Positive social support, Positive therapeutic relationship and Positive coping skills or problem solving skills  Continued Clinical Symptoms:  Depression:   Recent sense of peace/wellbeing  Cognitive Features That Contribute To Risk:  Polarized thinking    Suicide  Risk:  Minimal: No identifiable suicidal ideation.  Patients presenting with no risk factors but with morbid ruminations; may be classified as minimal risk based on the severity of the depressive symptoms  Discharge Diagnoses:   AXIS I:  Major Depression, Recurrent severe AXIS II:  Deferred AXIS III:   Past Medical History  Diagnosis Date  . Diabetes mellitus   . Fibromyalgia   . Hyperlipidemia   . Abdominal pain   . Nausea & vomiting   . Biliary dyskinesia   . Depression   . Joint pain   . Skin rash     due to medications  . Post traumatic stress disorder (PTSD)   . Sleep trouble   . Major depressive disorder   . Cervical strain   . Closed head injury 2010  . Renal stones   . Migraine     hx of none recent  . Arthritis   . Hypoglycemia    AXIS IV:  other psychosocial or environmental problems, problems related to legal system/crime, problems related to social environment and problems with primary support group AXIS V:  61-70 mild symptoms  Plan Of Care/Follow-up recommendations:  Activity:  As tolerated Diet:  Regular  Is patient on multiple antipsychotic therapies at discharge:  No   Has Patient had three or more failed trials of antipsychotic monotherapy by history:  No  Recommended Plan for Multiple Antipsychotic Therapies: NA    Maureen [name] 05/26/2013, 12:23 PM

## 2013-05-26 NOTE — Progress Notes (Signed)
Adult Psychoeducational Group Note  Date:  05/25/2013 Time:  10:00am  Group Topic/Focus:  Making Healthy Choices:   The focus of this group is to help patients identify negative/unhealthy choices they were using prior to admission and identify positive/healthier coping strategies to replace them upon discharge.  Participation Level:  Active  Participation Quality:  Appropriate and Attentive  Affect:  Appropriate  Cognitive:  Alert and Appropriate  Insight: Appropriate  Engagement in Group:  Engaged  Modes of Intervention:  Discussion and Education  Additional Comments:   Pt attended and participated in group. Discussion was on Leisure Lifestyle and making healthy choices. The question was asked What is one thing you can do to make a positive change in your life? Pt stated to start walking with her sons.  Gailen Shelter 05/26/2013, 4:03 AM

## 2013-05-26 NOTE — Progress Notes (Signed)
Hypoglycemic Event  CBG: 65  Treatment: 15 GM carbohydrate snack  Symptoms: None  Follow-up CBG: Time:1222hrs CBG Result:101  Possible Reasons for Event: Inadequate meal intake and Other: Patient cbg was 93 at 0619hrs but recieved 8 units of meal coverage  Comments/MD notified:Neil Mashburn PA notified and no other orders given    Raphael Gibney  Remember to initiate Hypoglycemia Order Set & complete

## 2013-05-26 NOTE — Progress Notes (Signed)
Patient ID: Maureen [name], female   DOB: [birth year], 43 y.o.   MRN: [id number] D: Patient stated  she is doing well and looking forward to discharge tomorrow. Pt mood and affect appeared anxious but appropriate to situation. Pt report decrease feeling of anxiety and depressive symptoms. Pt denies SI/HI/AVH. Pt attended evening karaoke group and engage in activities. Pt denies any needs or concerns.  Cooperative with assessment. No acute distressed noted at this time.   A: Met with pt 1:1. Medications administered as prescribed. Writer encouraged pt to discuss feelings. Pt encouraged to come to staff with any question or concerns.   R: Patient remains safe. She is complaint with medications and denies any adverse reaction. Continue current POC.

## 2013-05-26 NOTE — Progress Notes (Signed)
[facility] Adult Case Management Discharge Plan :  Will you be returning to the same living situation after discharge: Yes,  Patient will return to her home At discharge, do you have transportation home?:Yes,  Patient to arrange transportation home. Do you have the ability to pay for your medications:Yes,  Patient is able to obtain medications.  Release of information consent forms completed and in the chart;  Patient's signature needed at discharge.  Patient to Follow up at: Follow-up Information   Please follow up.   Contact information:          Follow up with [facility] On 06/13/2013. (Tuesday, Jun 13, 2013 at 3:00 PM.  Please arrive at 2:30 with completed registration form)    Contact information:   [place], [place]   [address]  [phone]      Patient denies SI/HI: Patient no longer endorsing SI/HI or other thoughts of self harm.    Safety Planning and Suicide Prevention discussed: .Reviewed with all patients during discharge planning group   White 05/26/2013, 1:00 PM

## 2013-05-30 IMAGING — MR MR PELVIS WO/W CM
4 of 9 series · 19 of 48 positions shown · IV contrast (multihance)
Comparison: 07/17/2011

CLINICAL DATA: Follow up abnormal CT question neoplasm of the
urachus

MRI PELVIS WITHOUT AND WITH CONTRAST
TECHNIQUE: Multiplanar multisequence MR imaging of the pelvis was
performed both before and after administration of intravenous
contrast.
Contrast: 17mL MULTIHANCE GADOBENATE DIMEGLUMINE 529 MG/ML IV SOLN

[Series 3: T2 · sagittal · 5.0mm · 0.53mm/px · 4 of 30 slices shown (1 of 3)]
[im 1/30]
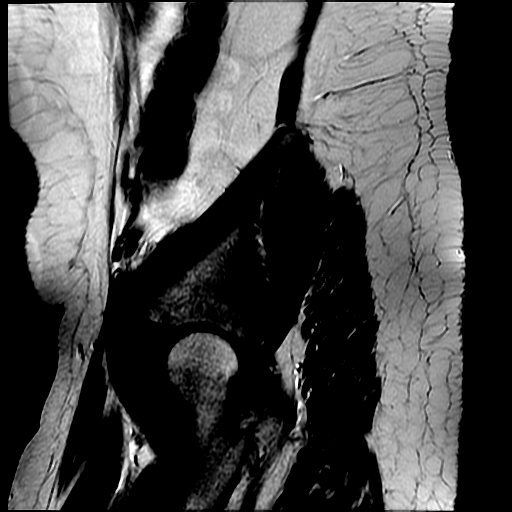
[im 10/30]
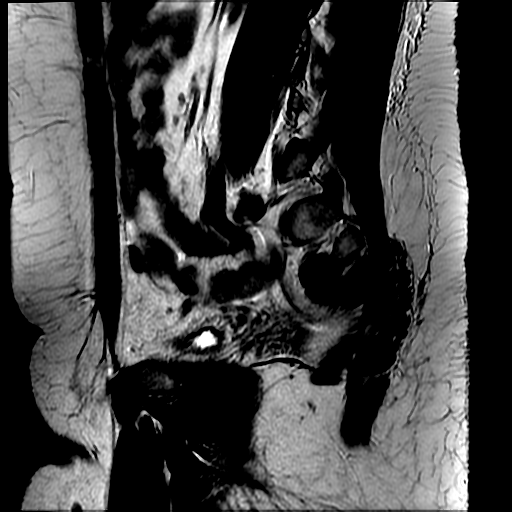
[im 20/30]
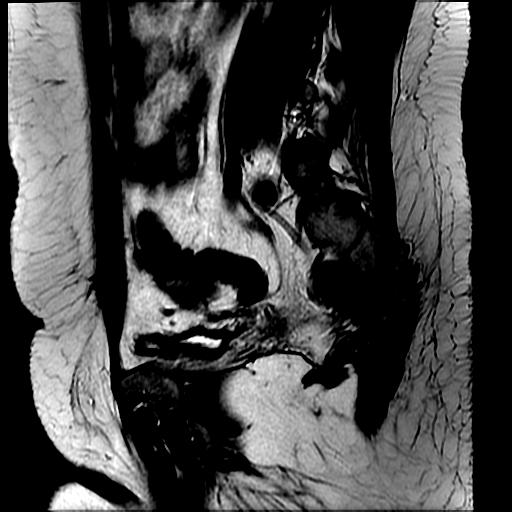
[im 30/30]
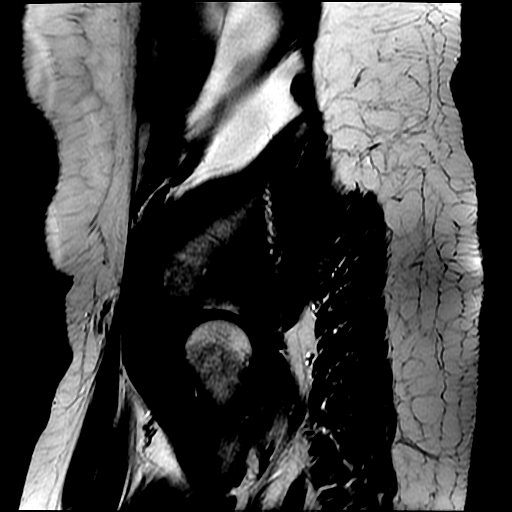

[Series 5: T1 · axial · 5.0mm · 0.53mm/px · z∈[-167,+49]mm · 5 of 37 slices shown]
[im 1/37]
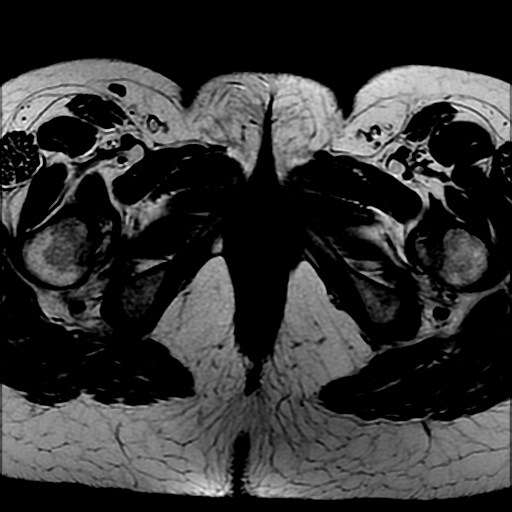
[im 10/37]
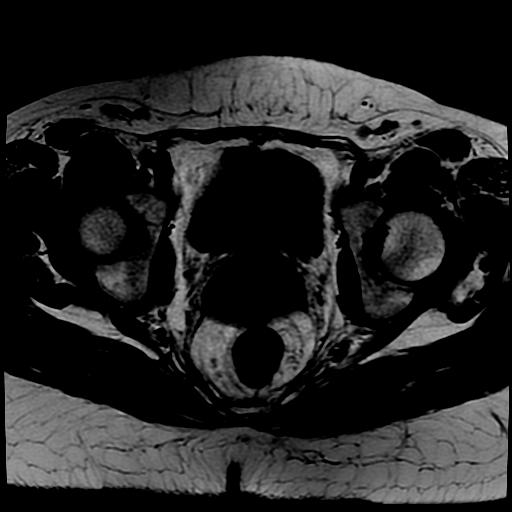
[im 19/37]
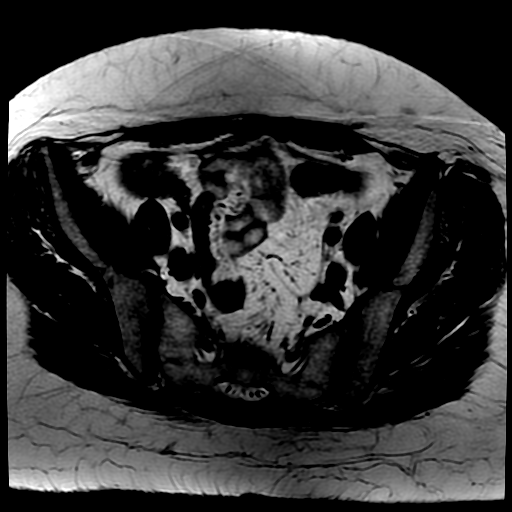
[im 28/37]
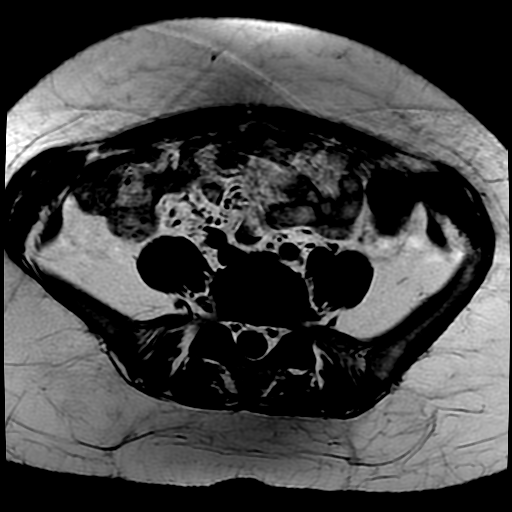
[im 37/37]
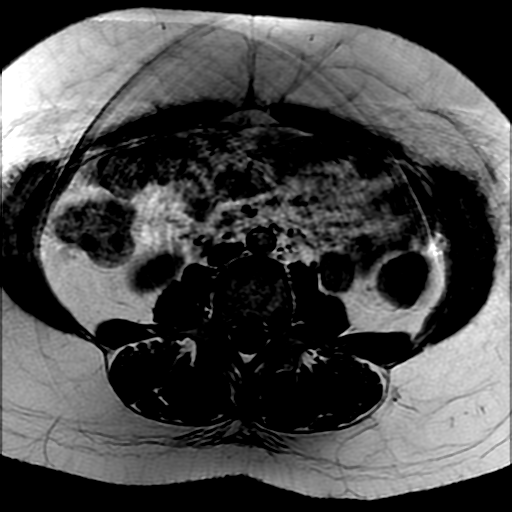

[Series 6: T2 · coronal · 5.0mm · 0.53mm/px · 5 of 33 slices shown (2 of 3)]
[im 1/33]
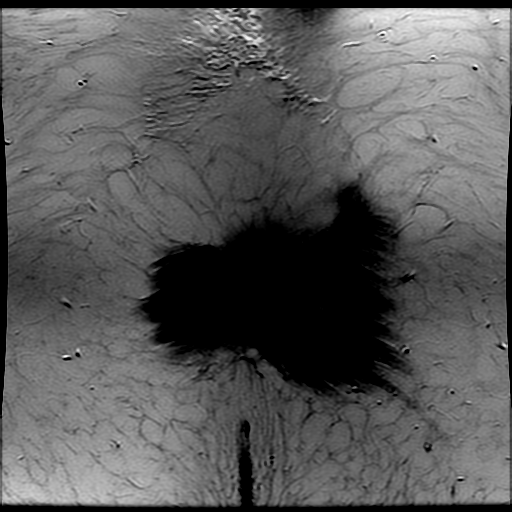
[im 9/33]
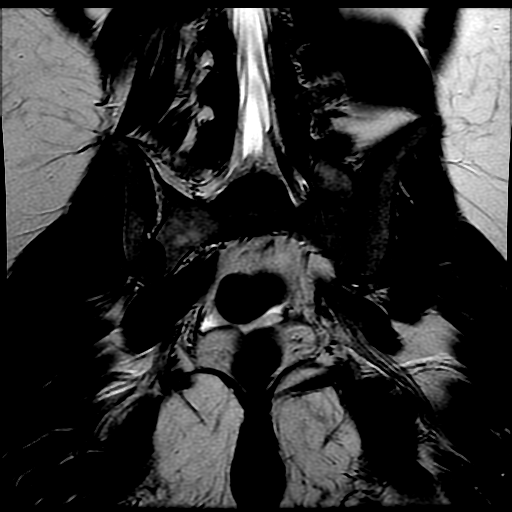
[im 17/33]
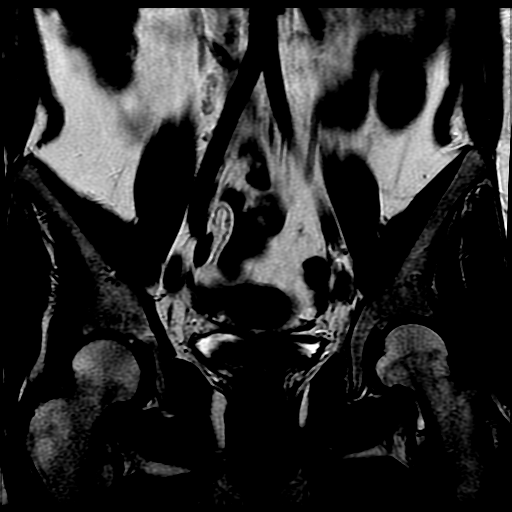
[im 25/33]
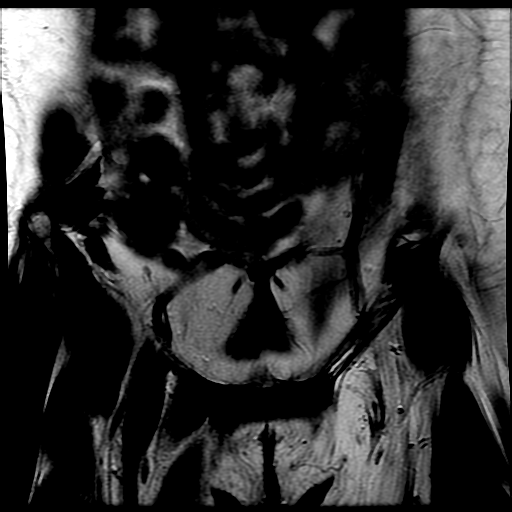
[im 33/33]
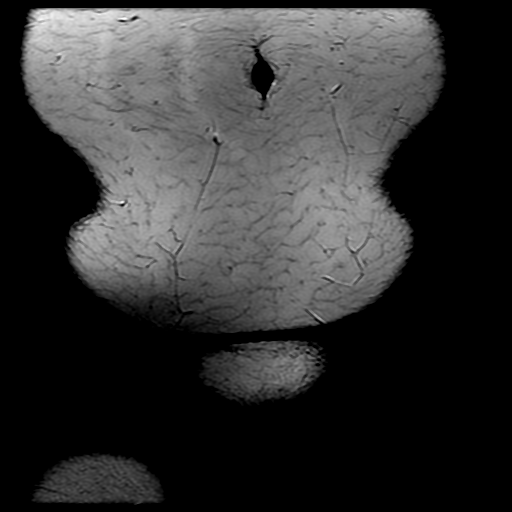

[Series 7: T2 · axial · 5.0mm · 0.53mm/px · z∈[-167,+49]mm · 5 of 37 slices shown (3 of 3)]
[im 1/37]
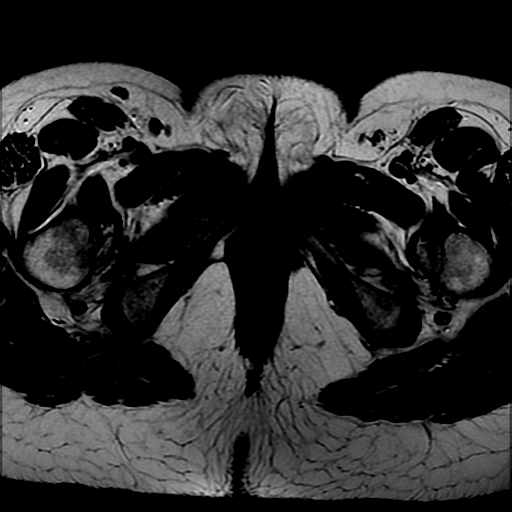
[im 8/37]
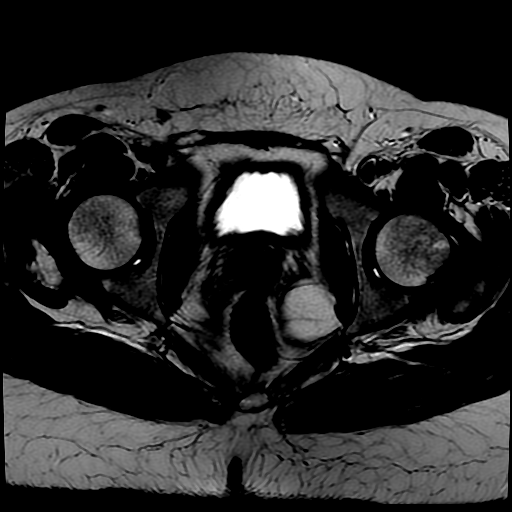
[im 15/37]
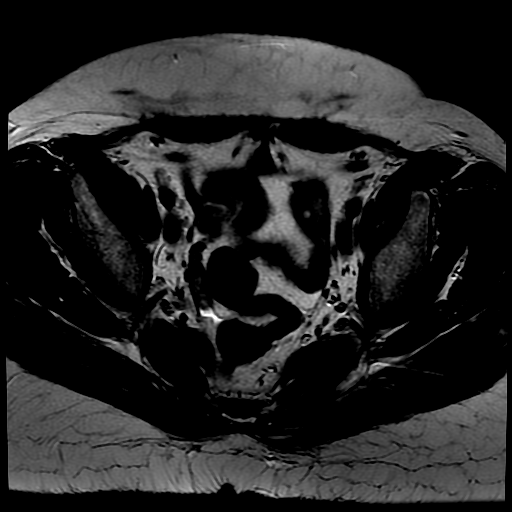
[im 22/37]
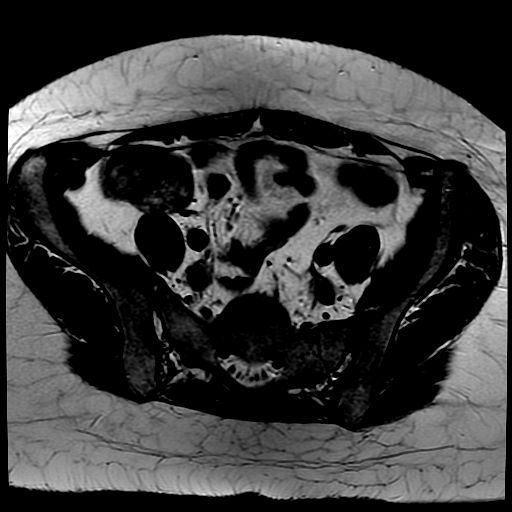
[im 37/37]
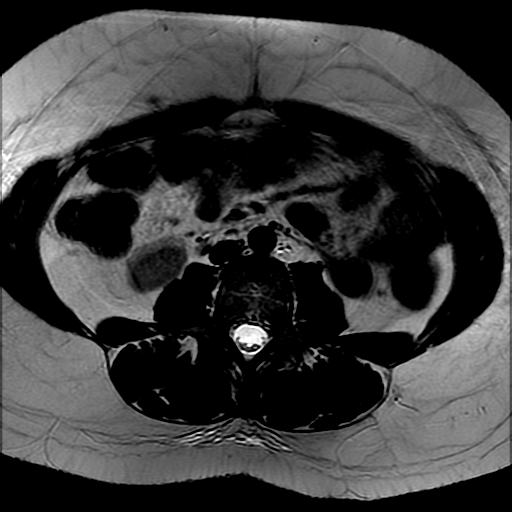

[19 of 48 positions shown; findings below may reference images not displayed]

FINDINGS: There is postoperative changes within the ventral
abdominal wall compatible with previous cesarean section and/or
hysterectomy.  No abnormal enhancing mass or fluid collections
identified.  The urinary bladder appears normal. Prior
hysterectomy.

No enlarged pelvic or inguinal lymph nodes.  The pelvic bowel loops
appear within normal limits.  Signal from within the bone marrow is
unremarkable.
IMPRESSION: 1.  No enhancing masses identified.
2.  Midline ventral abdominal wall soft tissue identified on CT
from 07/17/2011 likely represents an area of postoperative
scarring.

## 2013-05-30 NOTE — ED Provider Notes (Signed)
Medical screening examination/treatment/procedure(s) were performed by non-physician practitioner and as supervising physician I was immediately available for consultation/collaboration.   EKG Interpretation None        Julianne Rice, MD 05/30/13 [phone]

## 2013-05-30 NOTE — Clinical Social Work Note (Signed)
CSW spoke with patient to advise her a counseling appointment with Felipa Evener at [facility] on Monday, May 18 at 8:30 AM.

## 2013-05-30 NOTE — Progress Notes (Addendum)
Patient Discharge Instructions:  After Visit Summary (AVS):   Faxed to:  05/31/13 Discharge Summary Note:   Faxed to:  05/31/13 Psychiatric Admission Assessment Note:   Faxed to:  05/31/13 Suicide Risk Assessment - Discharge Assessment:   Faxed to:  05/31/13 Faxed/Sent to the Next Level Care provider:  05/31/13 Next Level Care Provider Has Access to the EMR, 05/30/13 Records provided to [facility] via [facility]/Epic access. Faxed to [facility] @ [phone]  Patsey Berthold, 05/30/2013, 3:39 PM

## 2013-06-13 ENCOUNTER — Ambulatory Visit (INDEPENDENT_AMBULATORY_CARE_PROVIDER_SITE_OTHER): Payer: 59 | Admitting: Psychiatry

## 2013-06-13 ENCOUNTER — Encounter (HOSPITAL_COMMUNITY): Payer: Self-pay | Admitting: Psychiatry

## 2013-06-13 VITALS — BP 120/80 | HR 74 | Ht 65.0 in | Wt 176.0 lb

## 2013-06-13 DIAGNOSIS — F4310 Post-traumatic stress disorder, unspecified: Secondary | ICD-10-CM

## 2013-06-13 DIAGNOSIS — F333 Major depressive disorder, recurrent, severe with psychotic symptoms: Secondary | ICD-10-CM

## 2013-06-13 DIAGNOSIS — F332 Major depressive disorder, recurrent severe without psychotic features: Secondary | ICD-10-CM

## 2013-06-13 MED ORDER — HYDROXYZINE HCL 10 MG PO TABS: 10.0000 mg | ORAL_TABLET | Freq: Three times a day (TID) | ORAL | Status: DC | PRN

## 2013-06-13 MED ORDER — CITALOPRAM HYDROBROMIDE 40 MG PO TABS: 40.0000 mg | ORAL_TABLET | Freq: Every day | ORAL | Status: DC

## 2013-06-13 MED ORDER — TRAZODONE HCL 100 MG PO TABS: ORAL_TABLET | ORAL | Status: DC
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
## 2013-06-14 IMAGING — CT CT ABD-PELV W/ CM
1 of 2 series · 15 of 32 positions shown, 19 images · IV contrast (100 ML OMNI 300)
Comparison: MRI pelvis 07/28/2011.  CT abdomen 07/17/2011.

***ADDENDUM*** CREATED: 11/15/2011 [DATE]

The patient is status post partial hysterectomy with a cervical
remnant.
CLINICAL DATA: Nausea, abdominal pain, and diarrhea.  Elevated
white count.
CT ABDOMEN AND PELVIS WITH CONTRAST
TECHNIQUE: Multidetector CT imaging of the abdomen and pelvis was
performed following the standard protocol during bolus
administration of intravenous contrast.
Contrast: 100mL OMNIPAQUE IOHEXOL 300 MG/ML  SOLN

[Series 2: abd/pel with · axial · 0.63mm/px · z∈[+1215,+1590]mm · 15 of 83 slices shown, 19 images]
[im 4/83  soft-tissue]
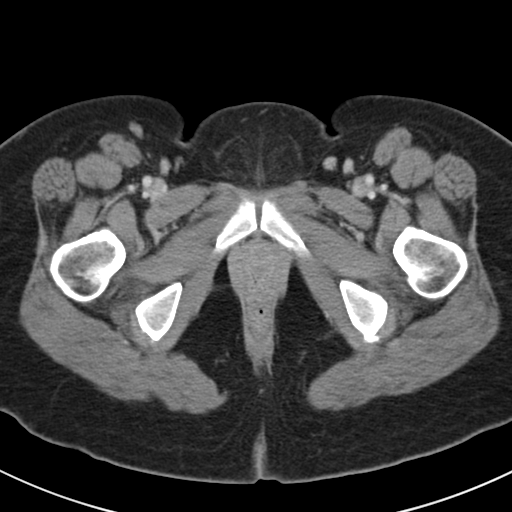
[im 4/83  bone]
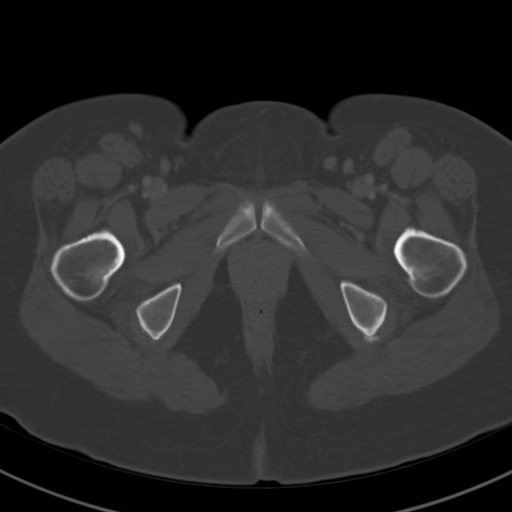
[im 10/83  soft-tissue]
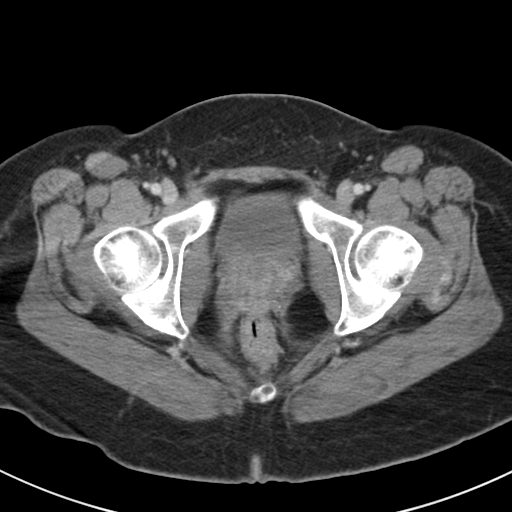
[im 17/83  soft-tissue]
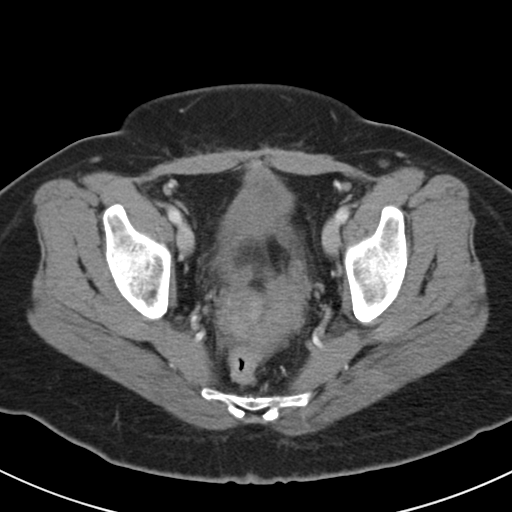
[im 23/83  soft-tissue]
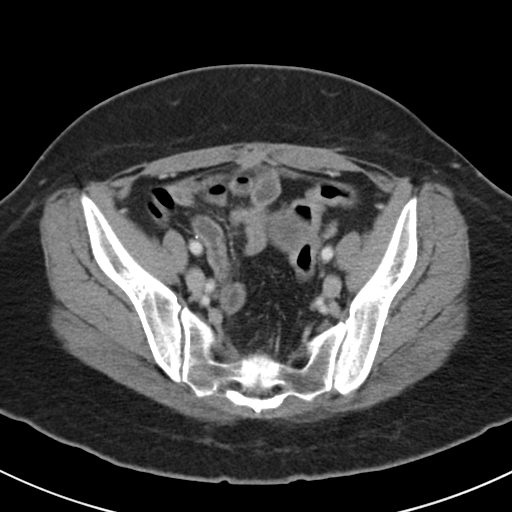
[im 30/83  soft-tissue]
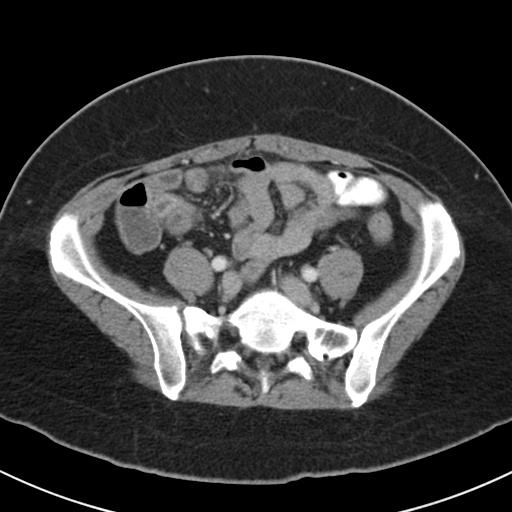
[im 37/83  soft-tissue]
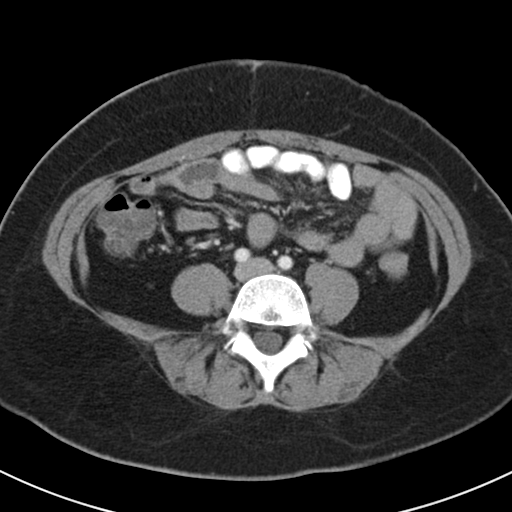
[im 43/83  soft-tissue]
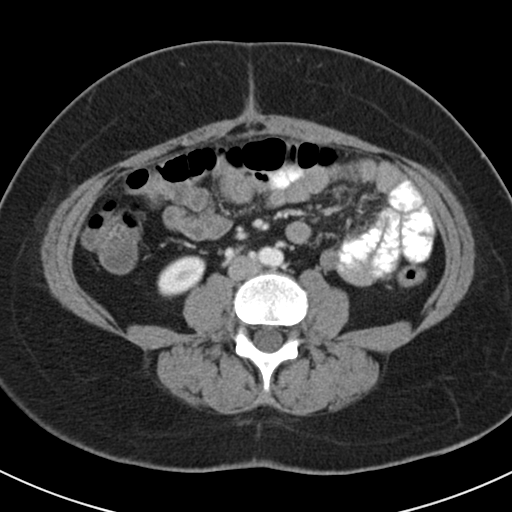
[im 46/83  soft-tissue]
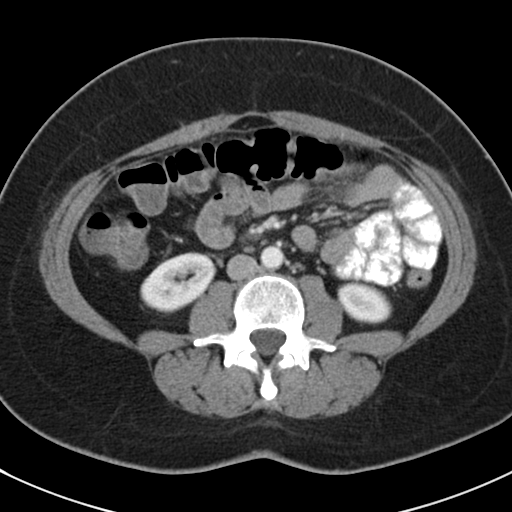
[im 53/83  soft-tissue]
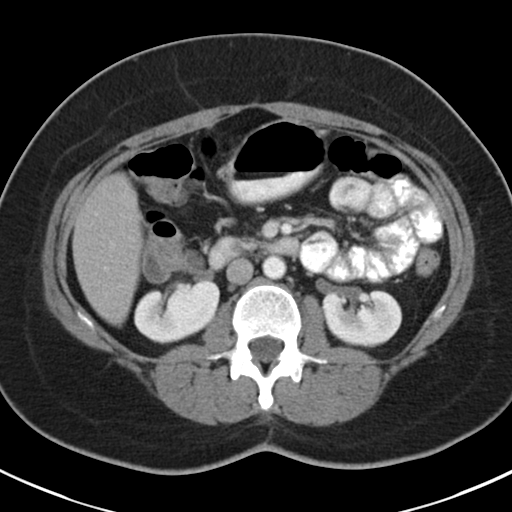
[im 53/83  bone]
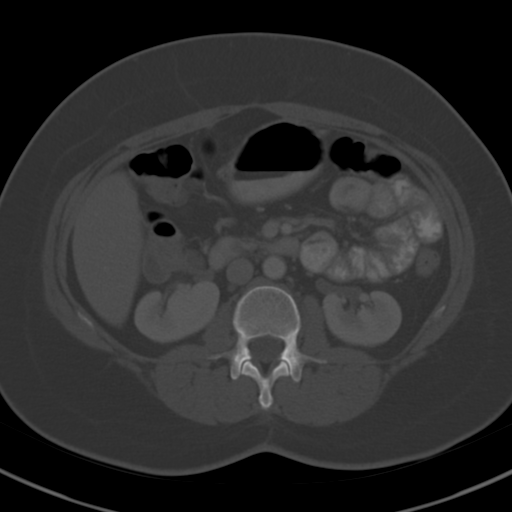
[im 60/83  soft-tissue]
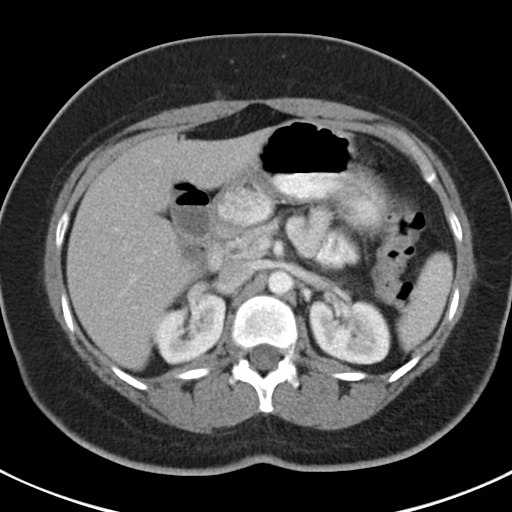
[im 66/83  soft-tissue]
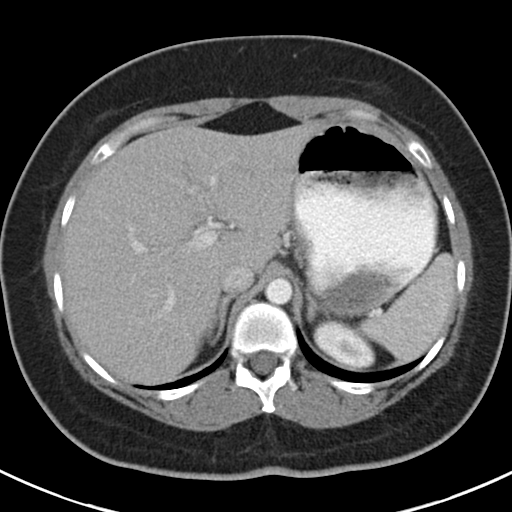
[im 69/83  lung]
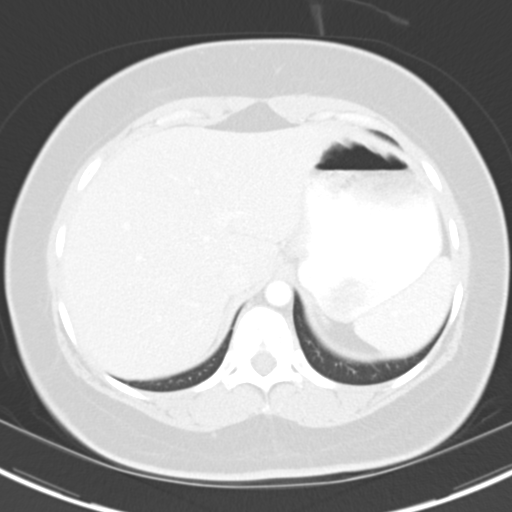
[im 73/83  soft-tissue]
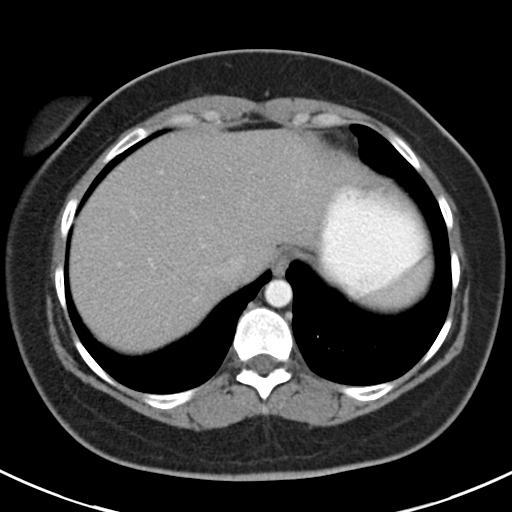
[im 73/83  lung]
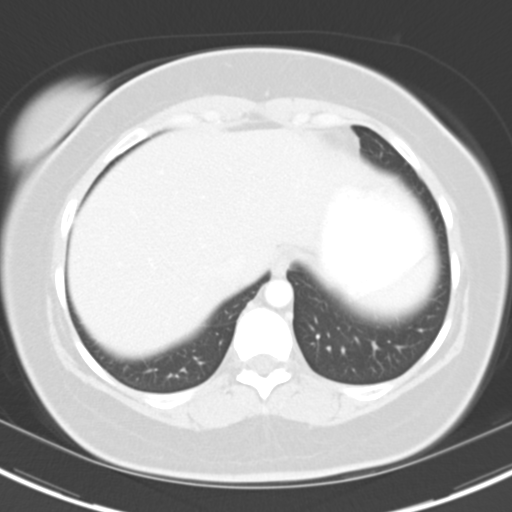
[im 76/83  lung]
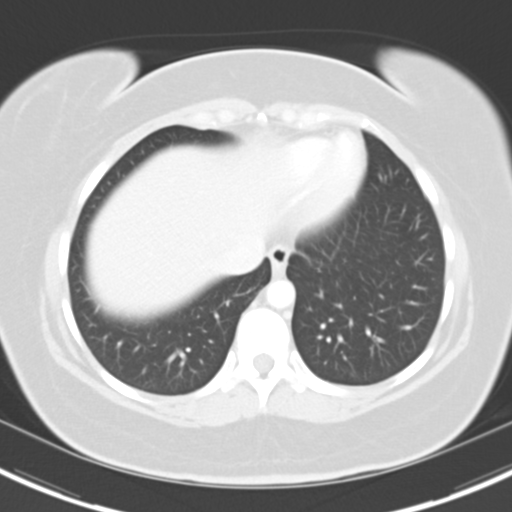
[im 79/83  soft-tissue]
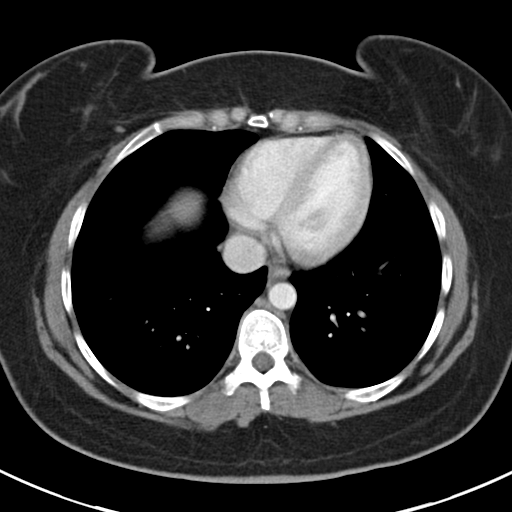
[im 79/83  lung]
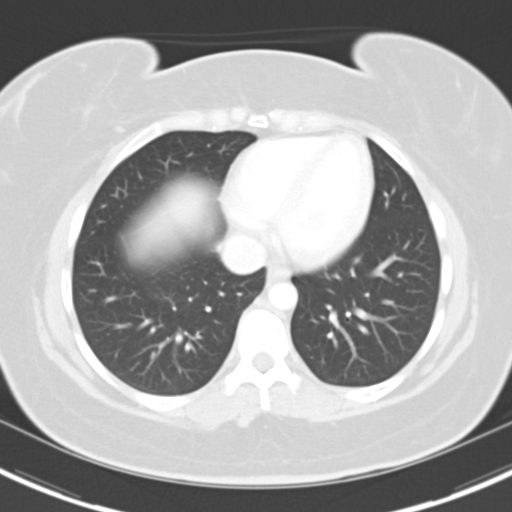

[15 of 32 positions shown; findings below may reference images not displayed]

FINDINGS: Clear lung bases.  No focal hepatic lesions.  No biliary
ductal dilatation.  Prior cholecystectomy.  Normal spleen, adrenal
glands, and pancreas.  No renal masses or obstruction.  Small
calculus noted previously not clearly identified.  Nonaneurysmal
atherosclerotic calcification of the aorta.  No retroperitoneal
adenopathy.  No bowel obstruction, free fluid, or free air.
Stomach, small bowel and large bowel show no inflammatory process
or mass.  No appendiceal inflammation.  Postoperative scarring
anterior abdominal wall continuous with the bladder redemonstrated
and stable.  Slightly greater than 3 cm left ovarian cyst, slightly
increased from priors.  Normal alignment of the lumbar spine.  No
evidence for diverticulitis.  Air in the lower uterine segment
persists with slight pelvic stranding similar to priors, status
post hysterectomy.
IMPRESSION: No acute abdominal or pelvic findings.  No bowel obstruction, free
fluid or free air. Slight increase size left ovarian cyst.
Anterior abdominal wall scarring stable compared with prior CT and
MR.

## 2013-06-23 ENCOUNTER — Telehealth (HOSPITAL_COMMUNITY): Payer: Self-pay

## 2013-06-23 MED ORDER — CITALOPRAM HYDROBROMIDE 40 MG PO TABS: 40.0000 mg | ORAL_TABLET | Freq: Every day | ORAL | Status: DC

## 2013-07-14 ENCOUNTER — Encounter (HOSPITAL_COMMUNITY): Payer: Self-pay | Admitting: Emergency Medicine

## 2013-07-14 ENCOUNTER — Emergency Department (HOSPITAL_COMMUNITY): Payer: 59

## 2013-07-14 ENCOUNTER — Emergency Department (HOSPITAL_COMMUNITY)
Admission: EM | Admit: 2013-07-14 | Discharge: 2013-07-15 | Disposition: A | Discharge status: Home / Self Care | Payer: 59 | Attending: Emergency Medicine | Admitting: Emergency Medicine

## 2013-07-14 DIAGNOSIS — F329 Major depressive disorder, single episode, unspecified: Secondary | ICD-10-CM | POA: Insufficient documentation

## 2013-07-14 DIAGNOSIS — R42 Dizziness and giddiness: Secondary | ICD-10-CM | POA: Insufficient documentation

## 2013-07-14 DIAGNOSIS — Z9089 Acquired absence of other organs: Secondary | ICD-10-CM | POA: Insufficient documentation

## 2013-07-14 DIAGNOSIS — Z794 Long term (current) use of insulin: Secondary | ICD-10-CM | POA: Insufficient documentation

## 2013-07-14 DIAGNOSIS — Z8719 Personal history of other diseases of the digestive system: Secondary | ICD-10-CM | POA: Insufficient documentation

## 2013-07-14 DIAGNOSIS — R112 Nausea with vomiting, unspecified: Secondary | ICD-10-CM | POA: Insufficient documentation

## 2013-07-14 DIAGNOSIS — R1012 Left upper quadrant pain: Secondary | ICD-10-CM | POA: Insufficient documentation

## 2013-07-14 DIAGNOSIS — Z79899 Other long term (current) drug therapy: Secondary | ICD-10-CM | POA: Insufficient documentation

## 2013-07-14 DIAGNOSIS — F4310 Post-traumatic stress disorder, unspecified: Secondary | ICD-10-CM | POA: Insufficient documentation

## 2013-07-14 DIAGNOSIS — E785 Hyperlipidemia, unspecified: Secondary | ICD-10-CM | POA: Insufficient documentation

## 2013-07-14 DIAGNOSIS — Z90710 Acquired absence of both cervix and uterus: Secondary | ICD-10-CM | POA: Insufficient documentation

## 2013-07-14 DIAGNOSIS — J029 Acute pharyngitis, unspecified: Secondary | ICD-10-CM | POA: Insufficient documentation

## 2013-07-14 DIAGNOSIS — E162 Hypoglycemia, unspecified: Secondary | ICD-10-CM

## 2013-07-14 DIAGNOSIS — Z87828 Personal history of other (healed) physical injury and trauma: Secondary | ICD-10-CM | POA: Insufficient documentation

## 2013-07-14 DIAGNOSIS — R079 Chest pain, unspecified: Secondary | ICD-10-CM | POA: Insufficient documentation

## 2013-07-14 DIAGNOSIS — G43909 Migraine, unspecified, not intractable, without status migrainosus: Secondary | ICD-10-CM | POA: Insufficient documentation

## 2013-07-14 DIAGNOSIS — Z87442 Personal history of urinary calculi: Secondary | ICD-10-CM | POA: Insufficient documentation

## 2013-07-14 DIAGNOSIS — E1169 Type 2 diabetes mellitus with other specified complication: Secondary | ICD-10-CM | POA: Insufficient documentation

## 2013-07-14 DIAGNOSIS — Z8739 Personal history of other diseases of the musculoskeletal system and connective tissue: Secondary | ICD-10-CM | POA: Insufficient documentation

## 2013-07-14 LAB — CBG MONITORING, ED
Glucose-Capillary: 77 mg/dL (ref 70–99)
Glucose-Capillary: 67 mg/dL — ABNORMAL LOW (ref 70–99)
Glucose-Capillary: 187 mg/dL — ABNORMAL HIGH (ref 70–99)
Glucose-Capillary: 59 mg/dL — ABNORMAL LOW (ref 70–99)
Glucose-Capillary: 92 mg/dL (ref 70–99)

## 2013-07-14 LAB — URINALYSIS, ROUTINE W REFLEX MICROSCOPIC
Bilirubin Urine: NEGATIVE
Glucose, UA: >1000 mg/dL — AB
Hgb urine dipstick: NEGATIVE
Ketones, ur: NEGATIVE mg/dL
Leukocytes, UA: NEGATIVE
Nitrite: NEGATIVE
Protein, ur: 30 mg/dL — AB
Specific Gravity, Urine: 1.028 (ref 1.005–1.030)
Urobilinogen, UA: 0.2 mg/dL (ref 0.0–1.0)
pH: 5 (ref 5.0–8.0)

## 2013-07-14 LAB — COMPREHENSIVE METABOLIC PANEL
ALT: 18 U/L (ref 0–35)
AST: 15 U/L (ref 0–37)
Albumin: 4 g/dL (ref 3.5–5.2)
Alkaline Phosphatase: 131 U/L — ABNORMAL HIGH (ref 39–117)
BUN: 7 mg/dL (ref 6–23)
CO2: 26 mEq/L (ref 19–32)
Calcium: 9.6 mg/dL (ref 8.4–10.5)
Chloride: 97 mEq/L (ref 96–112)
Creatinine, Ser: 0.87 mg/dL (ref 0.50–1.10)
GFR calc Af Amer: >90 mL/min (ref >90)
GFR calc non Af Amer: 82 mL/min — ABNORMAL LOW (ref >90)
Glucose, Bld: 50 mg/dL — ABNORMAL LOW (ref 70–99)
Potassium: 3.2 mEq/L — ABNORMAL LOW (ref 3.7–5.3)
Sodium: 140 mEq/L (ref 137–147)
Total Bilirubin: 0.6 mg/dL (ref 0.3–1.2)
Total Protein: 8.5 g/dL — ABNORMAL HIGH (ref 6.0–8.3)

## 2013-07-14 LAB — CBC WITH DIFFERENTIAL/PLATELET
Basophils Absolute: 0 10*3/uL (ref 0.0–0.1)
Basophils Relative: 0 % (ref 0–1)
Eosinophils Absolute: 0.1 10*3/uL (ref 0.0–0.7)
Eosinophils Relative: 1 % (ref 0–5)
HCT: 43.3 % (ref 36.0–46.0)
Hemoglobin: 15.2 g/dL — ABNORMAL HIGH (ref 12.0–15.0)
Lymphocytes Relative: 21 % (ref 12–46)
Lymphs Abs: 2.9 10*3/uL (ref 0.7–4.0)
MCH: 31.9 pg (ref 26.0–34.0)
MCHC: 35.1 g/dL (ref 30.0–36.0)
MCV: 91 fL (ref 78.0–100.0)
Monocytes Absolute: 0.9 10*3/uL (ref 0.1–1.0)
Monocytes Relative: 6 % (ref 3–12)
Neutro Abs: 9.9 10*3/uL — ABNORMAL HIGH (ref 1.7–7.7)
Neutrophils Relative %: 72 % (ref 43–77)
Platelets: 382 10*3/uL (ref 150–400)
RBC: 4.76 MIL/uL (ref 3.87–5.11)
RDW: 12.5 % (ref 11.5–15.5)
WBC: 13.8 10*3/uL — ABNORMAL HIGH (ref 4.0–10.5)

## 2013-07-14 LAB — I-STAT TROPONIN, ED: Troponin i, poc: 0 ng/mL (ref 0.00–0.08)

## 2013-07-14 LAB — URINE MICROSCOPIC-ADD ON

## 2013-07-14 LAB — LIPASE, BLOOD: Lipase: 14 U/L (ref 11–59)

## 2013-07-14 MED ORDER — METOCLOPRAMIDE HCL 5 MG/ML IJ SOLN
10.0000 mg | Freq: Once | INTRAMUSCULAR | Status: AC
  Administered 2013-07-14: 10 mg via INTRAVENOUS
  Filled 2013-07-14: qty 2

## 2013-07-14 MED ORDER — ONDANSETRON HCL 4 MG PO TABS: 4.0000 mg | ORAL_TABLET | Freq: Three times a day (TID) | ORAL | Status: DC | PRN

## 2013-07-14 MED ORDER — SODIUM CHLORIDE 0.9 % IV BOLUS (SEPSIS)
1000.0000 mL | Freq: Once | INTRAVENOUS | Status: AC
  Administered 2013-07-14: 1000 mL via INTRAVENOUS

## 2013-07-14 MED ORDER — DEXTROSE 50 % IV SOLN
50.0000 mL | Freq: Once | INTRAVENOUS | Status: AC
  Administered 2013-07-14: 50 mL via INTRAVENOUS
  Filled 2013-07-14: qty 50

## 2013-07-14 MED ORDER — ONDANSETRON 4 MG PO TBDP
8.0000 mg | ORAL_TABLET | Freq: Once | ORAL | Status: AC
  Administered 2013-07-14: 8 mg via ORAL
  Filled 2013-07-14: qty 2

## 2013-07-14 MED ORDER — GI COCKTAIL ~~LOC~~
30.0000 mL | Freq: Once | ORAL | Status: AC
  Administered 2013-07-14: 30 mL via ORAL
  Filled 2013-07-14: qty 30

## 2013-07-14 MED ORDER — DIPHENHYDRAMINE HCL 50 MG/ML IJ SOLN
25.0000 mg | Freq: Once | INTRAMUSCULAR | Status: AC
  Administered 2013-07-14: 25 mg via INTRAVENOUS
  Filled 2013-07-14: qty 1

## 2013-07-14 NOTE — Discharge Instructions (Signed)
PLEASE DO NOT TAKE YOUR EVENING DOSE OF INSULIN.    Hypoglycemia Hypoglycemia occurs when the glucose in your blood is too low. Glucose is a type of sugar that is your body's main energy source. Hormones, such as insulin and glucagon, control the level of glucose in the blood. Insulin lowers blood glucose and glucagon increases blood glucose. Having too much insulin in your blood stream, or not eating enough food containing sugar, can result in hypoglycemia. Hypoglycemia can happen to people with or without diabetes. It can develop quickly and can be a medical emergency.  CAUSES   Missing or delaying meals.  Not eating enough carbohydrates at meals.  Taking too much diabetes medicine.  Not timing your oral diabetes medicine or insulin doses with meals, snacks, and exercise.  Nausea and vomiting.  Certain medicines.  Severe illnesses, such as hepatitis, kidney disorders, and certain eating disorders.  Increased activity or exercise without eating something extra or adjusting medicines.  Drinking too much alcohol.  A nerve disorder that affects body functions like your heart rate, blood pressure, and digestion (autonomic neuropathy).  A condition where the stomach muscles do not function properly (gastroparesis). Therefore, medicines and food may not absorb properly.  Rarely, a tumor of the pancreas can produce too much insulin. SYMPTOMS   Hunger.  Sweating (diaphoresis).  Change in body temperature.  Shakiness.  Headache.  Anxiety.  Lightheadedness.  Irritability.  Difficulty concentrating.  Dry mouth.  Tingling or numbness in the hands or feet.  Restless sleep or sleep disturbances.  Altered speech and coordination.  Change in mental status.  Seizures or prolonged convulsions.  Combativeness.  Drowsiness (lethargic).  Weakness.  Increased heart rate or palpitations.  Confusion.  Pale, gray skin color.  Blurred or double  vision.  Fainting. DIAGNOSIS  A physical exam and medical history will be performed. Your caregiver may make a diagnosis based on your symptoms. Blood tests and other lab tests may be performed to confirm a diagnosis. Once the diagnosis is made, your caregiver will see if your signs and symptoms go away once your blood glucose is raised.  TREATMENT  Usually, you can easily treat your hypoglycemia when you notice symptoms.  Check your blood glucose. If it is less than 70 mg/dl, take one of the following:   3-4 glucose tablets.    cup juice.    cup regular soda.   1 cup skim milk.   -1 tube of glucose gel.   5-6 hard candies.   Avoid high-fat drinks or food that may delay a rise in blood glucose levels.  Do not take more than the recommended amount of sugary foods, drinks, gel, or tablets. Doing so will cause your blood glucose to go too high.   Wait 10-15 minutes and recheck your blood glucose. If it is still less than 70 mg/dl or below your target range, repeat treatment.   Eat a snack if it is more than 1 hour until your next meal.  There may be a time when your blood glucose may go so low that you are unable to treat yourself at home when you start to notice symptoms. You may need someone to help you. You may even faint or be unable to swallow. If you cannot treat yourself, someone will need to bring you to the hospital.  [place]  If you have diabetes, follow your diabetes management plan by:  Taking your medicines as directed.  Following your exercise plan.  Following your meal  plan. Do not skip meals. Eat on time.  Testing your blood glucose regularly. Check your blood glucose before and after exercise. If you exercise longer or different than usual, be sure to check blood glucose more frequently.  Wearing your medical alert jewelry that says you have diabetes.  Identify the cause of your hypoglycemia. Then, develop ways to prevent the  recurrence of hypoglycemia.  Do not take a hot bath or shower right after an insulin shot.  Always carry treatment with you. Glucose tablets are the easiest to carry.  If you are going to drink alcohol, drink it only with meals.  Tell friends or family members ways to keep you safe during a seizure. This may include removing hard or sharp objects from the area or turning you on your side.  Maintain a healthy weight. SEEK MEDICAL CARE IF:   You are having problems keeping your blood glucose in your target range.  You are having frequent episodes of hypoglycemia.  You feel you might be having side effects from your medicines.  You are not sure why your blood glucose is dropping so low.  You notice a change in vision or a new problem with your vision. SEEK IMMEDIATE MEDICAL CARE IF:   Confusion develops.  A change in mental status occurs.  The inability to swallow develops.  Fainting occurs. Document Released: 01/05/2005 Document Revised: 01/10/2013 Document Reviewed: 05/04/2011 [facility] Patient Information 2015 [place], [place]. This information is not intended to replace advice given to you by your health care provider. Make sure you discuss any questions you have with your health care provider.

## 2013-07-14 NOTE — ED Provider Notes (Signed)
CSN: [id number]     Arrival date & time 07/14/13  1535 History   First MD Initiated Contact with Patient 07/14/13 1839     Chief Complaint  Patient presents with  . Emesis     (Consider location/radiation/quality/duration/timing/severity/associated sxs/prior Treatment) Patient is a 43 y.o. female presenting with vomiting. The history is provided by the patient and medical records.  Emesis Severity:  Moderate Timing:  Intermittent Quality:  Stomach contents Progression:  Unchanged Chronicity:  New Recent urination:  Normal Relieved by:  Nothing Worsened by:  Nothing tried Ineffective treatments:  None tried Associated symptoms: abdominal pain, cough and sore throat   Associated symptoms: no chills, no diarrhea, no fever and no headaches   Abdominal pain:    Location:  Epigastric and LUQ   Quality:  Burning   Severity:  Moderate   Timing:  Constant   Progression:  Unchanged   Chronicity:  New   43 yo F pw n/v.  Nausea past two days. Vomiting today. Couple times. NB. Developed some upper abd discomfort after vomiting. Also with some dizziness/LH sensation. No vertigo. Resolved. Upon arrival to ED waiting room felt mild sob. Resolved <1 hour. Mild chest pain earlier today. Central. Burning. Felt like "heart burn". Not exertional. Radiated in throat a little. No other radiation.  No urinary or bowel complaints.  No headache or change in vision. No numbness/tingling/weakness.   Past Medical History  Diagnosis Date  . Diabetes mellitus   . Fibromyalgia   . Hyperlipidemia   . Abdominal pain   . Nausea & vomiting   . Biliary dyskinesia   . Depression   . Joint pain   . Skin rash     due to medications  . Post traumatic stress disorder (PTSD)   . Sleep trouble   . Major depressive disorder   . Cervical strain   . Closed head injury 2010  . Renal stones   . Migraine     hx of none recent  . Arthritis   . Hypoglycemia    Past Surgical History  Procedure Laterality  Date  . Lipoma removed  yrs ago  . Knee surgery Left 2012  . Cesarean section  1997, 1999  . Cholecystectomy  02/10/2011    Procedure: LAPAROSCOPIC CHOLECYSTECTOMY WITH INTRAOPERATIVE CHOLANGIOGRAM;  Surgeon: Judieth Keens, DO;  Location: [facility];  Service: General;  Laterality: N/A;  . Robotic assisted laparoscopic lysis of adhesion N/A 03/04/2012    Procedure: ROBOTIC ASSISTED LAPAROSCOPIC LYSIS OF EXTENSIVE ADHESIONS;  Surgeon: Claiborne Billings A. Pamala Hurry, MD;  Location: [facility];  Service: Gynecology;  Laterality: N/A;  . Tonsillectomy  2001  . Abdominal hysterectomy  2001  . Eye surgery  2006    laser eye surgery left eye  . Exploratory laparomty  Mar 04, 2012  . Laparoscopic appendectomy N/A 10/12/2012    Procedure: DIAGNOSTIC APPENDECTOMY LAPAROSCOPIC;  Surgeon: Madilyn Hook, DO;  Location: [facility];  Service: General;  Laterality: N/A;  . Appendectomy     Family History  Problem Relation Age of Onset  . Cancer Father     lymphoma  . Nephrolithiasis Father   . Vascular Disease Father   . Cancer Maternal Grandmother     colon  . Hypertension Mother   . Heart disease Mother   . Cataracts Mother   . Diabetes Brother   . Mental illness Maternal Grandfather   . Cancer Maternal Grandfather   . Heart disease Paternal Grandmother   . Heart disease Paternal Grandfather  History  Substance Use Topics  . Smoking status: Never Smoker   . Smokeless tobacco: Never Used  . Alcohol Use: No   OB History   Grav Para Term Preterm Abortions TAB SAB Ect Mult Living                 Review of Systems  Constitutional: Negative for fever and chills.  HENT: Positive for sore throat. Negative for congestion and rhinorrhea.   Respiratory: Negative for cough and shortness of breath.   Cardiovascular: Positive for chest pain. Negative for leg swelling.  Gastrointestinal: Positive for nausea, vomiting and abdominal pain. Negative for diarrhea.  Genitourinary: Negative for dysuria, hematuria, flank  pain and difficulty urinating.  Musculoskeletal: Negative for back pain.  Skin: Negative for color change and rash.  Neurological: Positive for dizziness. Negative for weakness, numbness and headaches.  All other systems reviewed and are negative.     Allergies  Meloxicam; Cephalexin; and Ibuprofen  Home Medications   Prior to Admission medications   Medication Sig Start Date End Date Taking? Authorizing Teriyah Purington  atorvastatin (LIPITOR) 40 MG tablet Take 1 tablet (40 mg total) by mouth daily. 05/26/13   Nena Polio, PA-C  citalopram (CELEXA) 40 MG tablet Take 1 tablet (40 mg total) by mouth daily. 06/23/13   Madison Hickman, NP  hydrOXYzine (ATARAX/VISTARIL) 10 MG tablet Take 1 tablet (10 mg total) by mouth 3 (three) times daily as needed for anxiety (anxiety). 06/13/13   Madison Hickman, NP  insulin aspart (NOVOLOG) 100 UNIT/ML injection Inject 8 Units into the skin 3 (three) times daily with meals. 05/26/13   Nena Polio, PA-C  insulin glargine (LANTUS) 100 UNIT/ML injection Inject 0.28 mLs (28 Units total) into the skin every evening. 05/26/13   Nena Polio, PA-C  lidocaine (LIDODERM) 5 % Place 1 patch onto the skin daily as needed (for pain.). Remove & Discard patch within 12 hours or as directed by MD 05/26/13   Nena Polio, PA-C  oxyCODONE (OXY IR/ROXICODONE) 5 MG immediate release tablet Take 1 tablet (5 mg total) by mouth 3 (three) times daily as needed. For pain. 05/26/13   Nena Polio, PA-C  traZODone (DESYREL) 100 MG tablet Take one tablet at bedtime if needed for insomnia. 06/13/13   Meghan Blankmann, NP   BP 112/57  Pulse 94  Temp(Src) 98.3 F (36.8 C) (Oral)  Resp 16  Ht 5\' 4"  (1.626 m)  Wt 170 lb (77.111 kg)  BMI 29.17 kg/m2  SpO2 100% Physical Exam  Nursing note and vitals reviewed. Constitutional: She is oriented to person, place, and time. She appears well-developed and well-nourished. No distress.  Sitting up in bed. NAD. Smiling. Speaks in full sentences.   HENT:  Head: Normocephalic and atraumatic.  Eyes: Conjunctivae and EOM are normal. Pupils are equal, round, and reactive to light. Right eye exhibits no discharge. Left eye exhibits no discharge.  Neck: No tracheal deviation present.  Cardiovascular: Normal rate, regular rhythm, normal heart sounds and intact distal pulses.   Pulmonary/Chest: Effort normal and breath sounds normal. No stridor. No respiratory distress. She has no wheezes. She has no rales.  Abdominal: Soft. She exhibits no distension. There is tenderness (minimal LUQ.). There is no rigidity, no guarding and no CVA tenderness.  Musculoskeletal: She exhibits no edema and no tenderness.  Neurological: She is alert and oriented to person, place, and time. She has normal strength. No cranial nerve deficit or sensory deficit. GCS eye subscore is 4. GCS verbal subscore is 5. GCS  motor subscore is 6.  No drift  Skin: Skin is warm and dry.  Psychiatric: She has a normal mood and affect. Her behavior is normal.    ED Course  Procedures (including critical care time) Labs Review Labs Reviewed  CBC WITH DIFFERENTIAL - Abnormal; Notable for the following:    WBC 13.8 (*)    Hemoglobin 15.2 (*)    Neutro Abs 9.9 (*)    All other components within normal limits  COMPREHENSIVE METABOLIC PANEL - Abnormal; Notable for the following:    Potassium 3.2 (*)    Glucose, Bld 50 (*)    Total Protein 8.5 (*)    Alkaline Phosphatase 131 (*)    GFR calc non Af Amer 82 (*)    All other components within normal limits  LIPASE, BLOOD  URINALYSIS, ROUTINE W REFLEX MICROSCOPIC  I-STAT TROPOININ, ED  CBG MONITORING, ED    Imaging Review No results found.   EKG Interpretation None      MDM   Final diagnoses:  None    43 yo F pw vomiting as primary complaint.  HDS, af. Benign abd exam. Do not believe imaging warranted at this time. Multiple prior CT scans. Hypoglycemic on labs. Given juice and d50. Improvement in glucose. Observed in  ED and maintained glucose.  reglan and benadryl for nausea. Already received zofran. Rest of w/u largely reassuring.  Symptoms significantly improved. Patient tolerating po. To hold evening dose of insulin.  Hypoglycemia likely related to decreased po and still taking insulin. To monitor glucose closely at home. Encouraged po intake in conjunction with zofran prescription.   Patient discharged home. Return precautions given. To follow up with pcp. patient in agreement with plan.  Labs and imaging reviewed by myself and considered in medical decision making if ordered. Imaging interpreted by radiology.   Discussed case with Dr. Darl Householder who is in agreement with assessment and plan.    Bonnita Hollow, MD 07/15/13 [phone]

## 2013-07-14 NOTE — ED Notes (Signed)
Pt verbalizes understanding of d/c instructions and denies any further needs at this time. 

## 2013-07-14 NOTE — ED Notes (Addendum)
Presents with nausea, vomiting ans SOB began today, pt reports slight chest pain a few days ago not presently. Type 1 diabetic, CBG 77. Not feeling well for past 2 days but vomitng, dizziness and SOB began today. Alert, oriented and MAEx4. Denies fevers. Denies diarrhea. Reports pain in abdomen from vomiting,

## 2013-07-14 NOTE — ED Notes (Signed)
Alert, NAD, calm, interactive speech clear. (c/o HA, epigastric burning after apple juice, nausea and dizziness), (denies: sob, back or abd pain).

## 2013-07-14 NOTE — ED Notes (Signed)
The tech ambulated the patient about twenty feet in the hallway and the gait was steady. The patient had no complains of dizziness and light headiness. The  Tech has reported to RN  In charge.

## 2013-07-14 NOTE — ED Notes (Signed)
Patient given apple juice to drink prior to transport to xray. Will reassess CBG on return.

## 2013-07-15 NOTE — ED Provider Notes (Signed)
I saw and evaluated the patient, reviewed the resident's note and I agree with the findings and plan.   EKG Interpretation   Date/Time:  Friday July 14 2013 19:25:03 EDT Ventricular Rate:  89 PR Interval:  127 QRS Duration: 86 QT Interval:  348 QTC Calculation: 423 R Axis:   95 Text Interpretation:  Sinus rhythm Borderline right axis deviation  Borderline T abnormalities, diffuse leads prolonged QT in the previous EKG  same day has resolved . It was likely artifact  Confirmed by [facility]  MD,  DAVID ([id number]) on 07/14/2013 7:28:24 PM      Maureen [name] is a 43 y.o. female hx of diabetes, fibromyalgia here with nausea, abdominal pain. This is recurrent issue and she has been to the ED several times in the past year for similar symptoms. Felt nauseous and vomited several times today. Also burning sensation in her chest. Heart, lung exam unremarkable. Abdomen soft, nontender. Labs show glucose 50. After given food and juice and D50, CBG improved to 180s. After observing for an hour, CBG stable around 90. Tolerated PO. Recommend holding night dose of lantus and check sugars frequently at home.   Results for orders placed during the hospital encounter of 07/14/13  CBC WITH DIFFERENTIAL      Result Value Ref Range   WBC 13.8 (*) 4.0 - 10.5 K/uL   RBC 4.76  3.87 - 5.11 MIL/uL   Hemoglobin 15.2 (*) 12.0 - 15.0 g/dL   HCT 43.3  36.0 - 46.0 %   MCV 91.0  78.0 - 100.0 fL   MCH 31.9  26.0 - 34.0 pg   MCHC 35.1  30.0 - 36.0 g/dL   RDW 12.5  11.5 - 15.5 %   Platelets 382  150 - 400 K/uL   Neutrophils Relative % 72  43 - 77 %   Neutro Abs 9.9 (*) 1.7 - 7.7 K/uL   Lymphocytes Relative 21  12 - 46 %   Lymphs Abs 2.9  0.7 - 4.0 K/uL   Monocytes Relative 6  3 - 12 %   Monocytes Absolute 0.9  0.1 - 1.0 K/uL   Eosinophils Relative 1  0 - 5 %   Eosinophils Absolute 0.1  0.0 - 0.7 K/uL   Basophils Relative 0  0 - 1 %   Basophils Absolute 0.0  0.0 - 0.1 K/uL  COMPREHENSIVE METABOLIC PANEL   Result Value Ref Range   Sodium 140  137 - 147 mEq/L   Potassium 3.2 (*) 3.7 - 5.3 mEq/L   Chloride 97  96 - 112 mEq/L   CO2 26  19 - 32 mEq/L   Glucose, Bld 50 (*) 70 - 99 mg/dL   BUN 7  6 - 23 mg/dL   Creatinine, Ser 0.87  0.50 - 1.10 mg/dL   Calcium 9.6  8.4 - 10.5 mg/dL   Total Protein 8.5 (*) 6.0 - 8.3 g/dL   Albumin 4.0  3.5 - 5.2 g/dL   AST 15  0 - 37 U/L   ALT 18  0 - 35 U/L   Alkaline Phosphatase 131 (*) 39 - 117 U/L   Total Bilirubin 0.6  0.3 - 1.2 mg/dL   GFR calc non Af Amer 82 (*) >90 mL/min   GFR calc Af Amer >90  >90 mL/min  LIPASE, BLOOD      Result Value Ref Range   Lipase 14  11 - 59 U/L  URINALYSIS, ROUTINE W REFLEX MICROSCOPIC  Result Value Ref Range   Color, Urine AMBER (*) YELLOW   APPearance HAZY (*) CLEAR   Specific Gravity, Urine 1.028  1.005 - 1.030   pH 5.0  5.0 - 8.0   Glucose, UA >1000 (*) NEGATIVE mg/dL   Hgb urine dipstick NEGATIVE  NEGATIVE   Bilirubin Urine NEGATIVE  NEGATIVE   Ketones, ur NEGATIVE  NEGATIVE mg/dL   Protein, ur 30 (*) NEGATIVE mg/dL   Urobilinogen, UA 0.2  0.0 - 1.0 mg/dL   Nitrite NEGATIVE  NEGATIVE   Leukocytes, UA NEGATIVE  NEGATIVE  URINE MICROSCOPIC-ADD ON      Result Value Ref Range   Squamous Epithelial / LPF MANY (*) RARE   WBC, UA 3-6  <3 WBC/hpf   Bacteria, UA RARE  RARE   Casts HYALINE CASTS (*) NEGATIVE   Urine-Other MUCOUS PRESENT    I-STAT TROPOININ, ED      Result Value Ref Range   Troponin i, poc 0.00  0.00 - 0.08 ng/mL   Comment 3           CBG MONITORING, ED      Result Value Ref Range   Glucose-Capillary 77  70 - 99 mg/dL  CBG MONITORING, ED      Result Value Ref Range   Glucose-Capillary 67 (*) 70 - 99 mg/dL  CBG MONITORING, ED      Result Value Ref Range   Glucose-Capillary 59 (*) 70 - 99 mg/dL   Comment 1 Notify RN     Comment 2 Documented in Chart    CBG MONITORING, ED      Result Value Ref Range   Glucose-Capillary 187 (*) 70 - 99 mg/dL  CBG MONITORING, ED      Result Value Ref  Range   Glucose-Capillary 92  70 - 99 mg/dL   Comment 1 Notify RN     Comment 2 Documented in Chart     Dg Chest 2 View  07/14/2013   CLINICAL DATA:  Chest pain.  EXAM: CHEST  2 VIEW  COMPARISON:  August 23, 2012.  FINDINGS: The heart size and mediastinal contours are within normal limits. Both lungs are clear. No pneumothorax or pleural effusion is noted. The visualized skeletal structures are unremarkable.  IMPRESSION: No acute cardiopulmonary abnormality seen.   Electronically Signed   By: Sabino Dick M.D.   On: 07/14/2013 20:01   Dg Abd 1 View  07/14/2013   CLINICAL DATA:  Abdominal pain.  EXAM: ABDOMEN - 1 VIEW  COMPARISON:  May 26, 2012.  FINDINGS: The bowel gas pattern is normal. Status post cholecystectomy. No radio-opaque calculi or other significant radiographic abnormality are seen.  IMPRESSION: No evidence of bowel obstruction or ileus.   Electronically Signed   By: Sabino Dick M.D.   On: 07/14/2013 20:02       Wandra Arthurs, MD 07/15/13 [phone]

## 2013-07-18 ENCOUNTER — Ambulatory Visit (INDEPENDENT_AMBULATORY_CARE_PROVIDER_SITE_OTHER): Payer: 59 | Admitting: Family Medicine

## 2013-07-18 ENCOUNTER — Ambulatory Visit (INDEPENDENT_AMBULATORY_CARE_PROVIDER_SITE_OTHER): Payer: 59

## 2013-07-18 VITALS — BP 116/64 | HR 108 | Temp 98.3°F | Resp 20 | Ht 64.75 in | Wt 162.8 lb

## 2013-07-18 DIAGNOSIS — R110 Nausea: Secondary | ICD-10-CM

## 2013-07-18 DIAGNOSIS — R1013 Epigastric pain: Secondary | ICD-10-CM

## 2013-07-18 DIAGNOSIS — E11319 Type 2 diabetes mellitus with unspecified diabetic retinopathy without macular edema: Secondary | ICD-10-CM

## 2013-07-18 DIAGNOSIS — R350 Frequency of micturition: Secondary | ICD-10-CM

## 2013-07-18 DIAGNOSIS — E10319 Type 1 diabetes mellitus with unspecified diabetic retinopathy without macular edema: Secondary | ICD-10-CM

## 2013-07-18 DIAGNOSIS — E1039 Type 1 diabetes mellitus with other diabetic ophthalmic complication: Secondary | ICD-10-CM

## 2013-07-18 LAB — POCT UA - MICROSCOPIC ONLY
Casts, Ur, LPF, POC: NEGATIVE
Crystals, Ur, HPF, POC: NEGATIVE
Yeast, UA: NEGATIVE

## 2013-07-18 LAB — POCT URINALYSIS DIPSTICK
Blood, UA: NEGATIVE
Glucose, UA: NEGATIVE
Ketones, UA: NEGATIVE
Leukocytes, UA: NEGATIVE
Nitrite, UA: NEGATIVE
Protein, UA: 30
Spec Grav, UA: 1.025
Urobilinogen, UA: 0.2
pH, UA: 5

## 2013-07-18 MED ORDER — PROMETHAZINE HCL 12.5 MG PO TABS: 12.5000 mg | ORAL_TABLET | Freq: Three times a day (TID) | ORAL | Status: DC | PRN

## 2013-07-18 MED ORDER — ONDANSETRON HCL 8 MG PO TABS: 8.0000 mg | ORAL_TABLET | Freq: Three times a day (TID) | ORAL | Status: DC | PRN

## 2013-07-18 MED ORDER — RANITIDINE HCL 150 MG PO CAPS: 150.0000 mg | ORAL_CAPSULE | Freq: Two times a day (BID) | ORAL | Status: DC

## 2013-07-18 NOTE — Progress Notes (Addendum)
Subjective:    Patient ID: Maureen [name], female    DOB: 1970/04/11, 43 y.o.   MRN: [id number] This chart was scribed for Reginia Forts, MD by Vernell Barrier, Medical Scribe. The patient was seen in room 11. This patient's care was started at 5:50 PM.  07/18/2013  Follow-up, Nausea, Abdominal Pain and Back Pain  Abdominal Pain Associated symptoms include frequency and nausea. Pertinent negatives include no constipation, diarrhea, dysuria, fever, hematuria or vomiting.  Back Pain Associated symptoms include abdominal pain. Pertinent negatives include no chest pain, dysuria, fever or pelvic pain.   HPI Comments: Maureen [name] is a 43 y.o. female w/ hx of kidney stones, diabetes I, fibromyalgia, appendectomy, hysterectomy, and cholecystectomy presents to the [facility] for ED follow-up. Seen 6/26 at [facility] for burning abdominal pain, sore throat, nausea, vomiting. Some SOB and chest pain reported as well. Abdominal exam was benign. WBC was 13.8. Glucose was 50. Urine was negative. Troponin was negative. Lipase was negative. KUB was negative. CXR was negative. EKG showed diffuse non specific ST changes. Provided Zofran prescription.   Still reports worsening achy epigastric abdominal pain and bilateral lower back pain. Back pain worse when abdominal pain is present. Reports current pain is worse from Friday ED visit. Some nausea still; no emesis since 2-3 episodes 4 days ago. Last dosage of Zofran $RemoveB'4mg'BGPrnohl$  was 1 day ago; no longer taking. States she does have some frequency and urgency along with other sxs. Was given Reglan and Benadryl during ED visit. Was also given medication to treat heart burn. States all medications provided significant relief. Was able to fall asleep that night. Sxs were improving until 3 days ago. Hx of cyst on ovary so states abdominal pain is normal for her but that this pain feels like prior kidney stone pain.  No indigestion. Appetite has decreased  but attributes that to stress. Sugars have been fluctuating. No hx of gastroparesis; does not have a gastroenterologist. Denies fever, chills, diaphoresis diarrhea, constipation, hematuria, dysuria, melena, or hematochezia.   Review of Systems  Constitutional: Positive for appetite change. Negative for fever, chills and diaphoresis.  Respiratory: Negative for shortness of breath.   Cardiovascular: Negative for chest pain.  Gastrointestinal: Positive for nausea, abdominal pain and abdominal distention. Negative for vomiting, diarrhea, constipation, blood in stool and anal bleeding.  Genitourinary: Positive for urgency, frequency and flank pain. Negative for dysuria, hematuria, enuresis and pelvic pain.  Musculoskeletal: Positive for back pain.    Past Medical History  Diagnosis Date  . Diabetes mellitus   . Fibromyalgia   . Hyperlipidemia   . Abdominal pain   . Nausea & vomiting   . Biliary dyskinesia   . Depression   . Joint pain   . Skin rash     due to medications  . Post traumatic stress disorder (PTSD)   . Sleep trouble   . Major depressive disorder   . Cervical strain   . Closed head injury 2010  . Renal stones   . Migraine     hx of none recent  . Arthritis   . Hypoglycemia    Allergies  Allergen Reactions  . Meloxicam Nausea Only  . Cephalexin Rash    Pt was admitted to the hospital upon taking.  . Ibuprofen Nausea And Vomiting and Rash   Current Outpatient Prescriptions  Medication Sig Dispense Refill  . aspirin 81 MG tablet Take 81 mg by mouth daily.      Marland Kitchen atorvastatin (  LIPITOR) 40 MG tablet Take 1 tablet (40 mg total) by mouth daily.  90 tablet  3  . cyclobenzaprine (FLEXERIL) 5 MG tablet Take 5 mg by mouth 3 (three) times daily as needed for muscle spasms.       . insulin aspart (NOVOLOG) 100 UNIT/ML injection Inject 8 Units into the skin 3 (three) times daily with meals.  10 mL  11  . insulin glargine (LANTUS) 100 UNIT/ML injection Inject 0.28 mLs (28 Units  total) into the skin every evening.  10 mL  11  . lidocaine (LIDODERM) 5 % Place 1 patch onto the skin daily as needed (for pain.). Remove & Discard patch within 12 hours or as directed by MD  30 patch  0  . busPIRone (BUSPAR) 10 MG tablet Take 1 tablet (10 mg total) by mouth 3 (three) times daily.  90 tablet  1  . citalopram (CELEXA) 40 MG tablet Take 1 tablet (40 mg total) by mouth daily.  30 tablet  1  . ondansetron (ZOFRAN) 8 MG tablet Take 1 tablet (8 mg total) by mouth every 8 (eight) hours as needed for nausea or vomiting.  20 tablet  0  . promethazine (PHENERGAN) 12.5 MG tablet Take 1 tablet (12.5 mg total) by mouth every 8 (eight) hours as needed for nausea or vomiting.  30 tablet  0  . ranitidine (ZANTAC) 150 MG capsule Take 1 capsule (150 mg total) by mouth 2 (two) times daily.  60 capsule  0  . zolpidem (AMBIEN) 10 MG tablet Take 1 tablet (10 mg total) by mouth at bedtime as needed for sleep.  30 tablet  0   No current facility-administered medications for this visit.   History   Social History  . Marital Status: Married    Spouse Name: Darothy Courtright    Number of Children: 2  . Years of Education: 12+   Occupational History  . [employer]   Social History Main Topics  . Smoking status: Never Smoker   . Smokeless tobacco: Never Used  . Alcohol Use: No  . Drug Use: No  . Sexual Activity: Yes    Partners: Male    Birth Control/ Protection: Surgical   Other Topics Concern  . Not on file   Social History Narrative   Lives with husband and two sons.  Before going back to school, she was a Research scientist (physical sciences) at [employer].  Stop working when she was diagnosed with endometriosis and had a TIA.    Objective:  Triage vitals: BP 116/64  Pulse 108  Temp(Src) 98.3 F (36.8 C) (Oral)  Resp 20  Ht 5' 4.75" (1.645 m)  Wt 162 lb 12.8 oz (73.846 kg)  BMI 27.29 kg/m2  SpO2 98%  Physical Exam  Nursing note and vitals reviewed. Constitutional:  She is oriented to person, place, and time. She appears well-developed and well-nourished. No distress.  HENT:  Head: Normocephalic and atraumatic.  Right Ear: External ear normal.  Left Ear: External ear normal.  Nose: Nose normal.  Mouth/Throat: Oropharynx is clear and moist.  Eyes: Conjunctivae and EOM are normal. Pupils are equal, round, and reactive to light.  Neck: Normal range of motion. Neck supple. Carotid bruit is not present. No tracheal deviation present. No thyromegaly present.  Cardiovascular: Normal rate, regular rhythm, normal heart sounds and intact distal pulses.  Exam reveals no gallop and no friction rub.   No murmur heard. Pulmonary/Chest: Effort normal and breath  sounds normal. No respiratory distress. She has no wheezes. She has no rales.  Abdominal: Soft. Bowel sounds are normal. She exhibits no distension and no mass. There is tenderness in the epigastric area and left upper quadrant. There is no rebound and no guarding.  Musculoskeletal: Normal range of motion.       Lumbar back: She exhibits pain. She exhibits normal range of motion, no tenderness and no bony tenderness.  LUMBAR SPINE: full ROM; straight leg raises negative; motor 5/5 BLE; toe and heel walking intact; marching intact.  Lymphadenopathy:    She has no cervical adenopathy.  Neurological: She is alert and oriented to person, place, and time. No cranial nerve deficit.  Skin: Skin is warm and dry. No rash noted. She is not diaphoretic. No erythema. No pallor.  Psychiatric: She has a normal mood and affect. Her behavior is normal.   Results for orders placed in visit on 07/18/13  CBC WITH DIFFERENTIAL      Result Value Ref Range   WBC 13.7 (*) 4.0 - 10.5 K/uL   RBC 4.75  3.87 - 5.11 MIL/uL   Hemoglobin 15.2 (*) 12.0 - 15.0 g/dL   HCT 42.9  36.0 - 46.0 %   MCV 90.3  78.0 - 100.0 fL   MCH 32.0  26.0 - 34.0 pg   MCHC 35.4  30.0 - 36.0 g/dL   RDW 13.6  11.5 - 15.5 %   Platelets 424 (*) 150 - 400 K/uL     Neutrophils Relative % 68  43 - 77 %   Neutro Abs 9.3 (*) 1.7 - 7.7 K/uL   Lymphocytes Relative 26  12 - 46 %   Lymphs Abs 3.6  0.7 - 4.0 K/uL   Monocytes Relative 5  3 - 12 %   Monocytes Absolute 0.7  0.1 - 1.0 K/uL   Eosinophils Relative 1  0 - 5 %   Eosinophils Absolute 0.1  0.0 - 0.7 K/uL   Basophils Relative 0  0 - 1 %   Basophils Absolute 0.0  0.0 - 0.1 K/uL   Smear Review Criteria for review not met    COMPREHENSIVE METABOLIC PANEL      Result Value Ref Range   Sodium 139  135 - 145 mEq/L   Potassium 3.2 (*) 3.5 - 5.3 mEq/L   Chloride 97  96 - 112 mEq/L   CO2 30  19 - 32 mEq/L   Glucose, Bld 49 (*) 70 - 99 mg/dL   BUN 9  6 - 23 mg/dL   Creat 0.91  0.50 - 1.10 mg/dL   Total Bilirubin 0.6  0.2 - 1.2 mg/dL   Alkaline Phosphatase 98  39 - 117 U/L   AST 12  0 - 37 U/L   ALT 14  0 - 35 U/L   Total Protein 7.7  6.0 - 8.3 g/dL   Albumin 4.3  3.5 - 5.2 g/dL   Calcium 9.5  8.4 - 10.5 mg/dL  LIPASE      Result Value Ref Range   Lipase 12  0 - 75 U/L  POCT URINALYSIS DIPSTICK      Result Value Ref Range   Color, UA yellow     Clarity, UA hazy     Glucose, UA neg     Bilirubin, UA small     Ketones, UA neg     Spec Grav, UA 1.025     Blood, UA neg     pH, UA 5.0  Protein, UA 30     Urobilinogen, UA 0.2     Nitrite, UA neg     Leukocytes, UA Negative    POCT UA - MICROSCOPIC ONLY      Result Value Ref Range   WBC, Ur, HPF, POC 2-4     RBC, urine, microscopic 0-2     Bacteria, U Microscopic small     Mucus, UA small     Epithelial cells, urine per micros 5-8     Crystals, Ur, HPF, POC neg     Casts, Ur, LPF, POC neg     Yeast, UA neg     [facility] reading (PRIMARY) by  Dr. Tamala Julian.  AAS:  No free air; no obstructive process; no ileus.    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 Abdominal pain epigastric and LUQ: persistent; benign abdominal exam in office.  Repeat labs today due to worsening pain.  Rx for Zantac $RemoveBe'150mg'nGhhDavLn$  bid provided.  BRAT diet, hydration. If no improvement in 1-2 weeks, call office.  May warrant referral to GI and gastric emptying study. 2.  Nausea:  New. Associated with epigastric abdominal pain.  Rx for Zofran $RemoveBe'8mg'DMzmhJGoa$  and Phenergan 12.$RemoveBeforeDE'5mg'wuzeklEomoNKKPr$  provided. BRAT diet.  Obtain labs.  Hydrate.  No vomiting in 72 hours. 3.  Urinary frequency:  New.  Benign urine in office; no hematuria to suggest nephrolithiasis. 4. DMI: with labile sugars; monitor sugars closely with change in appetite and po intake.   Meds ordered this encounter  Medications  . ondansetron (ZOFRAN) 8 MG tablet    Sig: Take 1 tablet (8 mg total) by mouth every 8 (eight) hours as needed for nausea or vomiting.    Dispense:  20 tablet    Refill:  0  . ranitidine (ZANTAC) 150 MG capsule    Sig: Take 1 capsule (150 mg total) by mouth 2 (two) times daily.    Dispense:  60 capsule    Refill:  0  . promethazine (PHENERGAN) 12.5 MG tablet    Sig: Take 1 tablet (12.5 mg total) by mouth every 8 (eight) hours as needed for nausea or vomiting.    Dispense:  30 tablet    Refill:  0    No Follow-up on file.   I personally performed the services described in this documentation, which was scribed in my presence.  The recorded information has been reviewed and is accurate.  Reginia Forts, M.D.  [facility] [address] [place], [place]  [address] [phone] phone [phone] fax

## 2013-07-18 NOTE — Patient Instructions (Signed)
1. CALL IF NAUSEA AND ABDOMINAL PAIN ARE NOT SIGNIFICANTLY IMPROVED IN ONE WEEK.  Nausea and Vomiting Nausea is a sick feeling that often comes before throwing up (vomiting). Vomiting is a reflex where stomach contents come out of your mouth. Vomiting can cause severe loss of body fluids (dehydration). Children and elderly adults can become dehydrated quickly, especially if they also have diarrhea. Nausea and vomiting are symptoms of a condition or disease. It is important to find the cause of your symptoms. CAUSES   Direct irritation of the stomach lining. This irritation can result from increased acid production (gastroesophageal reflux disease), infection, food poisoning, taking certain medicines (such as nonsteroidal anti-inflammatory drugs), alcohol use, or tobacco use.  Signals from the brain.These signals could be caused by a headache, heat exposure, an inner ear disturbance, increased pressure in the brain from injury, infection, a tumor, or a concussion, pain, emotional stimulus, or metabolic problems.  An obstruction in the gastrointestinal tract (bowel obstruction).  Illnesses such as diabetes, hepatitis, gallbladder problems, appendicitis, kidney problems, cancer, sepsis, atypical symptoms of a heart attack, or eating disorders.  Medical treatments such as chemotherapy and radiation.  Receiving medicine that makes you sleep (general anesthetic) during surgery. DIAGNOSIS Your caregiver may ask for tests to be done if the problems do not improve after a few days. Tests may also be done if symptoms are severe or if the reason for the nausea and vomiting is not clear. Tests may include:  Urine tests.  Blood tests.  Stool tests.  Cultures (to look for evidence of infection).  X-rays or other imaging studies. Test results can help your caregiver make decisions about treatment or the need for additional tests. TREATMENT You need to stay well hydrated. Drink frequently but in  small amounts.You may wish to drink water, sports drinks, clear broth, or eat frozen ice pops or gelatin dessert to help stay hydrated.When you eat, eating slowly may help prevent nausea.There are also some antinausea medicines that may help prevent nausea. HOME CARE INSTRUCTIONS   Take all medicine as directed by your caregiver.  If you do not have an appetite, do not force yourself to eat. However, you must continue to drink fluids.  If you have an appetite, eat a normal diet unless your caregiver tells you differently.  Eat a variety of complex carbohydrates (rice, wheat, potatoes, bread), lean meats, yogurt, fruits, and vegetables.  Avoid high-fat foods because they are more difficult to digest.  Drink enough water and fluids to keep your urine clear or pale yellow.  If you are dehydrated, ask your caregiver for specific rehydration instructions. Signs of dehydration may include:  Severe thirst.  Dry lips and mouth.  Dizziness.  Dark urine.  Decreasing urine frequency and amount.  Confusion.  Rapid breathing or pulse. SEEK IMMEDIATE MEDICAL CARE IF:   You have blood or brown flecks (like coffee grounds) in your vomit.  You have black or bloody stools.  You have a severe headache or stiff neck.  You are confused.  You have severe abdominal pain.  You have chest pain or trouble breathing.  You do not urinate at least once every 8 hours.  You develop cold or clammy skin.  You continue to vomit for longer than 24 to 48 hours.  You have a fever. MAKE SURE YOU:   Understand these instructions.  Will watch your condition.  Will get help right away if you are not doing well or get worse. Document Released: 01/05/2005 Document Revised:  03/30/2011 Document Reviewed: 06/04/2010 ExitCare Patient Information 2015 [place], [place]. This information is not intended to replace advice given to you by your health care Jannah Guardiola. Make sure you discuss any questions you  have with your health care Jaleea Alesi.

## 2013-07-19 ENCOUNTER — Ambulatory Visit (INDEPENDENT_AMBULATORY_CARE_PROVIDER_SITE_OTHER): Payer: 59 | Admitting: Psychiatry

## 2013-07-19 ENCOUNTER — Encounter (HOSPITAL_COMMUNITY): Payer: Self-pay | Admitting: Psychiatry

## 2013-07-19 ENCOUNTER — Other Ambulatory Visit: Payer: Self-pay | Admitting: Psychiatry

## 2013-07-19 VITALS — BP 128/78 | HR 93 | Ht 65.0 in | Wt 162.4 lb

## 2013-07-19 DIAGNOSIS — G4700 Insomnia, unspecified: Secondary | ICD-10-CM

## 2013-07-19 DIAGNOSIS — F331 Major depressive disorder, recurrent, moderate: Secondary | ICD-10-CM

## 2013-07-19 DIAGNOSIS — F411 Generalized anxiety disorder: Secondary | ICD-10-CM

## 2013-07-19 LAB — COMPREHENSIVE METABOLIC PANEL
ALT: 14 U/L (ref 0–35)
AST: 12 U/L (ref 0–37)
Albumin: 4.3 g/dL (ref 3.5–5.2)
Alkaline Phosphatase: 98 U/L (ref 39–117)
BUN: 9 mg/dL (ref 6–23)
CO2: 30 mEq/L (ref 19–32)
Calcium: 9.5 mg/dL (ref 8.4–10.5)
Chloride: 97 mEq/L (ref 96–112)
Creat: 0.91 mg/dL (ref 0.50–1.10)
Glucose, Bld: 49 mg/dL — ABNORMAL LOW (ref 70–99)
Potassium: 3.2 mEq/L — ABNORMAL LOW (ref 3.5–5.3)
Sodium: 139 mEq/L (ref 135–145)
Total Bilirubin: 0.6 mg/dL (ref 0.2–1.2)
Total Protein: 7.7 g/dL (ref 6.0–8.3)

## 2013-07-19 LAB — CBC WITH DIFFERENTIAL/PLATELET
Basophils Absolute: 0 10*3/uL (ref 0.0–0.1)
Basophils Relative: 0 % (ref 0–1)
Eosinophils Absolute: 0.1 10*3/uL (ref 0.0–0.7)
Eosinophils Relative: 1 % (ref 0–5)
HCT: 42.9 % (ref 36.0–46.0)
Hemoglobin: 15.2 g/dL — ABNORMAL HIGH (ref 12.0–15.0)
Lymphocytes Relative: 26 % (ref 12–46)
Lymphs Abs: 3.6 10*3/uL (ref 0.7–4.0)
MCH: 32 pg (ref 26.0–34.0)
MCHC: 35.4 g/dL (ref 30.0–36.0)
MCV: 90.3 fL (ref 78.0–100.0)
Monocytes Absolute: 0.7 10*3/uL (ref 0.1–1.0)
Monocytes Relative: 5 % (ref 3–12)
Neutro Abs: 9.3 10*3/uL — ABNORMAL HIGH (ref 1.7–7.7)
Neutrophils Relative %: 68 % (ref 43–77)
Platelets: 424 10*3/uL — ABNORMAL HIGH (ref 150–400)
RBC: 4.75 MIL/uL (ref 3.87–5.11)
RDW: 13.6 % (ref 11.5–15.5)
WBC: 13.7 10*3/uL — ABNORMAL HIGH (ref 4.0–10.5)

## 2013-07-19 LAB — LIPASE: Lipase: 12 U/L (ref 0–75)

## 2013-07-19 MED ORDER — BUSPIRONE HCL 10 MG PO TABS: 10.0000 mg | ORAL_TABLET | Freq: Three times a day (TID) | ORAL | Status: DC

## 2013-07-19 MED ORDER — OLANZAPINE 2.5 MG PO TABS: 2.5000 mg | ORAL_TABLET | Freq: Every day | ORAL | Status: DC

## 2013-07-19 MED ORDER — ZOLPIDEM TARTRATE ER 12.5 MG PO TBCR: 12.5000 mg | EXTENDED_RELEASE_TABLET | Freq: Every evening | ORAL | Status: DC | PRN

## 2013-07-19 MED ORDER — ZOLPIDEM TARTRATE 10 MG PO TABS: 10.0000 mg | ORAL_TABLET | Freq: Every evening | ORAL | Status: DC | PRN

## 2013-07-19 MED ORDER — CITALOPRAM HYDROBROMIDE 40 MG PO TABS: 40.0000 mg | ORAL_TABLET | Freq: Every day | ORAL | Status: DC

## 2013-07-19 MED ORDER — ARIPIPRAZOLE 2 MG PO TABS: 2.0000 mg | ORAL_TABLET | Freq: Every day | ORAL | Status: DC

## 2013-07-19 NOTE — Progress Notes (Signed)
   [facility] Follow-up Outpatient Visit  MARIANNA CID 08/24/1970  Date:  07/19/13 Subjective: "I'm not doing the way i think i should be feeling." Pt stopped taking the trazodone and hydroxyzine. Sleeping is poor, up before that. Appetite is poor, lost 21 lbs. Pt is tearful. Recommended therapy to patient, she has stressors at home.Current stressors with husband, who is cheating on her. Suggested keeping a journal, and spending an hour a day for herself, to improve spiritual health. She has intermittent, passive, SI, but not today. She is able to contract for safety. She denies SI/HI/AVH. She is tolerating her medications. Rtc in 4 weeks.   There were no vitals filed for this visit.  Mental Status Examination  Appearance: casual  Alert: Yes Attention: fair  Cooperative: Yes Eye Contact: Fair Speech: wdl  Psychomotor Activity: Psychomotor Retardation Memory/Concentration: fair  Oriented: time/date and situation Mood: Anxious, Depressed, Dysphoric and Hopeless Affect: Depressed and Flat Thought Processes and Associations: Coherent Fund of Knowledge: Fair Thought Content: preoccupations Insight: Fair Judgement: Fair  Diagnosis:  MDD, recurrent, moderate Anxiety, unspecified Insomnia Treatment Plan:  Celexa 40 mg po daily for depression Abilify 2 mg po daily for adjuvant therapy buspar 10 mg tid for anxiety Ambien Cr 12.5 mg hs for insomnia  Rtc in 4 weeks Refer to therapy   Madison Hickman, NP

## 2013-07-20 ENCOUNTER — Telehealth (HOSPITAL_COMMUNITY): Payer: Self-pay

## 2013-07-20 NOTE — Telephone Encounter (Signed)
07/20/13 11:57am Patient came and pick-up rx script  No DL#.Marland KitchenMariana [name]

## 2013-07-21 LAB — URINE CULTURE: Colony Count: >=100000

## 2013-07-27 MED ORDER — CIPROFLOXACIN HCL 500 MG PO TABS: 500.0000 mg | ORAL_TABLET | Freq: Two times a day (BID) | ORAL | Status: DC

## 2013-07-27 NOTE — Addendum Note (Signed)
Addended by: Wardell Honour on: 07/27/2013 10:23 AM   Modules accepted: Orders

## 2013-08-09 ENCOUNTER — Ambulatory Visit (INDEPENDENT_AMBULATORY_CARE_PROVIDER_SITE_OTHER): Payer: 59 | Admitting: Family Medicine

## 2013-08-09 VITALS — BP 124/68 | HR 92 | Temp 97.8°F | Resp 16 | Ht 63.0 in | Wt 166.6 lb

## 2013-08-09 DIAGNOSIS — R1031 Right lower quadrant pain: Secondary | ICD-10-CM

## 2013-08-09 DIAGNOSIS — N736 Female pelvic peritoneal adhesions (postinfective): Secondary | ICD-10-CM

## 2013-08-09 DIAGNOSIS — G8929 Other chronic pain: Secondary | ICD-10-CM

## 2013-08-09 MED ORDER — TRAMADOL HCL 50 MG PO TABS: 50.0000 mg | ORAL_TABLET | Freq: Three times a day (TID) | ORAL | Status: DC | PRN

## 2013-08-09 MED ORDER — HYOSCYAMINE SULFATE 0.125 MG SL SUBL: 0.1250 mg | SUBLINGUAL_TABLET | SUBLINGUAL | Status: DC | PRN

## 2013-08-09 NOTE — Progress Notes (Addendum)
This chart was scribed for Maureen Haber, MD by Maureen [name], ED Scribe. This patient was seen in room 10 and the patient's care was started at 5:52 PM.   Patient ID: Maureen [name] MRN: [id number], DOB: 1970-06-30, 43 y.o. Date of Encounter: 08/09/2013, 5:52 PM  Primary Physician: Maureen Haber, MD  Chief Complaint:  Chief Complaint  Patient presents with  . Follow-up    Hospital follow up- anxiety attack     HPI: 43 y.o. year old female with history below presents to [facility] for a hospitalization follow up secondary to an anxiety attack. Pt states that he has been suffering from worsening anxiety attack for the past 6 months. Pt states that she was seen by Dr. Tamala [name] on  07/18/13 for abdominal pain and back pain. She states that a contaminant was found in her urine and she was prescribed Cipro. However, she is still complaining of mild abdominal pain despite having finished her course of antibiotics. Pt states that she had her appendix removed, and she was told that she may have adhesions. She states that most of the pain is localized to where her appendix used to be. Pt states that her abdominal pain is intermittent lasting about 3 days.  Pt states that she was admitted at behavioral health. Maureen [name] states that the stress is due to issues with her [name]. She states that her [name] had an affair with another woman and the woman pressed charges on her claiming that she made threats against her, which caused her to get arrasted. Pt states that later her charges were dropped, and the woman was charged with stalking. She says that her [name] took out life insurance policies on her and her son so she is being very cautions of him.  Denies any fever, chills, nausea, emesis, SOB, and chest pain.  She has two teenage sons of whom she is proud, one on a full scholarship to [employer], the other going into a junior year of high school  Past Medical History  Diagnosis Date  .  Diabetes mellitus   . Fibromyalgia   . Hyperlipidemia   . Abdominal pain   . Nausea & vomiting   . Biliary dyskinesia   . Depression   . Joint pain   . Skin rash     due to medications  . Post traumatic stress disorder (PTSD)   . Sleep trouble   . Major depressive disorder   . Cervical strain   . Closed head injury 2010  . Renal stones   . Migraine     hx of none recent  . Arthritis   . Hypoglycemia      Home Meds: Prior to Admission medications   Medication Sig Start Date End Date Taking? Authorizing Provider  aspirin 81 MG tablet Take 81 mg by mouth daily.   Yes Historical Provider, MD  atorvastatin (LIPITOR) 40 MG tablet Take 1 tablet (40 mg total) by mouth daily. 05/26/13  Yes Nena Polio, PA-C  citalopram (CELEXA) 40 MG tablet Take 1 tablet (40 mg total) by mouth daily. 07/19/13  Yes Meghan Blankmann, NP  cyclobenzaprine (FLEXERIL) 5 MG tablet Take 5 mg by mouth 3 (three) times daily as needed for muscle spasms.  07/12/13  Yes Historical Provider, MD  insulin aspart (NOVOLOG) 100 UNIT/ML injection Inject 8 Units into the skin 3 (three) times daily with meals. 05/26/13  Yes Nena Polio, PA-C  insulin glargine (LANTUS) 100 UNIT/ML injection Inject 0.28 mLs (28 Units total) into  the skin every evening. 05/26/13  Yes Nena Polio, PA-C  lidocaine (LIDODERM) 5 % Place 1 patch onto the skin daily as needed (for pain.). Remove & Discard patch within 12 hours or as directed by MD 05/26/13  Yes Nena Polio, PA-C  zolpidem (AMBIEN) 10 MG tablet Take 1 tablet (10 mg total) by mouth at bedtime as needed for sleep. 07/19/13 08/18/13 Yes Meghan Blankmann, NP  busPIRone (BUSPAR) 10 MG tablet Take 1 tablet (10 mg total) by mouth 3 (three) times daily. 07/19/13   Madison Hickman, NP  ciprofloxacin (CIPRO) 500 MG tablet Take 1 tablet (500 mg total) by mouth 2 (two) times daily. 07/27/13   Wardell Honour, MD  ondansetron (ZOFRAN) 8 MG tablet Take 1 tablet (8 mg total) by mouth every 8 (eight) hours as  needed for nausea or vomiting. 07/18/13   Wardell Honour, MD  promethazine (PHENERGAN) 12.5 MG tablet Take 1 tablet (12.5 mg total) by mouth every 8 (eight) hours as needed for nausea or vomiting. 07/18/13   Wardell Honour, MD  ranitidine (ZANTAC) 150 MG capsule Take 1 capsule (150 mg total) by mouth 2 (two) times daily. 07/18/13   Wardell Honour, MD    Allergies:  Allergies  Allergen Reactions  . Meloxicam Nausea Only  . Cephalexin Rash    Pt was admitted to the hospital upon taking.  . Ibuprofen Nausea And Vomiting and Rash    History   Social History  . Marital Status: Married    Spouse Name: Maureen [name]    Number of Children: 2  . Years of Education: 12+   Occupational History  . [employer]   Social History Main Topics  . Smoking status: Never Smoker   . Smokeless tobacco: Never Used  . Alcohol Use: No  . Drug Use: No  . Sexual Activity: Yes    Partners: Male    Birth Control/ Protection: Surgical   Other Topics Concern  . Not on file   Social History Narrative   Lives with [name] and two sons.  Before going back to school, she was a Research scientist (physical sciences) at [employer].  Stop working when she was diagnosed with endometriosis and had a TIA.     Review of Systems: Positive for abdominal pain and increased anxiety. Constitutional: negative for chills, fever, night sweats, weight changes, or fatigue  HEENT: negative for vision changes, hearing loss, congestion, rhinorrhea, ST, epistaxis, or sinus pressure Cardiovascular: negative for chest pain or palpitations Respiratory: negative for hemoptysis, wheezing, shortness of breath, or cough Abdominal: negative for abdominal pain, nausea, vomiting, diarrhea, or constipation Dermatological: negative for rash Neurologic: negative for headache, dizziness, or syncope All other systems reviewed and are otherwise negative with the exception to those above and in the HPI.   Physical Exam: Blood  pressure 124/68, pulse 92, temperature 97.8 F (36.6 C), temperature source Oral, resp. rate 16, height 5\' 3"  (1.6 m), weight 166 lb 9.6 oz (75.569 kg), SpO2 98.00%., Body mass index is 29.52 kg/(m^2). General: Well developed, well nourished, in no acute distress. Head: Normocephalic, atraumatic, eyes without discharge, sclera non-icteric, nares are without discharge. Bilateral auditory canals clear, TM's are without perforation, pearly grey and translucent with reflective cone of light bilaterally. Oral cavity moist, posterior pharynx without exudate, erythema, peritonsillar abscess, or post nasal drip.  Neck: Supple. No thyromegaly. Full ROM. No lymphadenopathy. Lungs: Clear bilaterally to auscultation without wheezes, rales, or rhonchi. Breathing is unlabored. Heart:  RRR with S1 S2. No murmurs, rubs, or gallops appreciated. Abdomen: Soft, non-tender, non-distended with normoactive bowel sounds. No hepatomegaly. No rebound/guarding. No obvious abdominal masses. No CVA tenderness. Msk:  Strength and tone normal for age. Extremities/Skin: Warm and dry. No clubbing or cyanosis. No edema. No rashes or suspicious lesions. Neuro: Alert and oriented X 3. Moves all extremities spontaneously. Gait is normal. CNII-XII grossly in tact. Psych:  Responds to questions appropriately.  Crying intermittently but able to smile and speak optimistically about two sons     ASSESSMENT AND PLAN:  56 y.o. year old female with Abdominal pain, chronic, right lower quadrant - Plan: traMADol (ULTRAM) 50 MG tablet, hyoscyamine (LEVSIN/SL) 0.125 MG SL tablet  Pelvic adhesions - Plan: traMADol (ULTRAM) 50 MG tablet, hyoscyamine (LEVSIN/SL) 0.125 MG SL tablet    I believe this is going to take a long time to resolve if he does at all. She may need further surgery with Dr. Valentino Saxon to lyse these adhesions.   I personally performed the services described in this documentation, which was scribed in my presence. The recorded  information has been reviewed and is accurate.  Signed, Maureen Haber, MD 08/09/2013 5:52 PM

## 2013-08-14 ENCOUNTER — Telehealth: Payer: Self-pay

## 2013-08-14 NOTE — Telephone Encounter (Signed)
Patient requesting a different medication to be called in to [facility] on [facility] for her pain. Per patient "hyoscyamine" gave her no relief and "Tramadol" cause her body to tremble. Patients call back number is [phone]

## 2013-08-15 ENCOUNTER — Encounter (HOSPITAL_COMMUNITY): Payer: Self-pay | Admitting: Emergency Medicine

## 2013-08-15 DIAGNOSIS — E119 Type 2 diabetes mellitus without complications: Secondary | ICD-10-CM | POA: Insufficient documentation

## 2013-08-15 DIAGNOSIS — Z8719 Personal history of other diseases of the digestive system: Secondary | ICD-10-CM | POA: Insufficient documentation

## 2013-08-15 DIAGNOSIS — Z794 Long term (current) use of insulin: Secondary | ICD-10-CM | POA: Diagnosis not present

## 2013-08-15 DIAGNOSIS — R350 Frequency of micturition: Secondary | ICD-10-CM | POA: Insufficient documentation

## 2013-08-15 DIAGNOSIS — Z7982 Long term (current) use of aspirin: Secondary | ICD-10-CM | POA: Insufficient documentation

## 2013-08-15 DIAGNOSIS — Z9089 Acquired absence of other organs: Secondary | ICD-10-CM | POA: Insufficient documentation

## 2013-08-15 DIAGNOSIS — M129 Arthropathy, unspecified: Secondary | ICD-10-CM | POA: Insufficient documentation

## 2013-08-15 DIAGNOSIS — Z87442 Personal history of urinary calculi: Secondary | ICD-10-CM | POA: Diagnosis not present

## 2013-08-15 DIAGNOSIS — E785 Hyperlipidemia, unspecified: Secondary | ICD-10-CM | POA: Insufficient documentation

## 2013-08-15 DIAGNOSIS — N390 Urinary tract infection, site not specified: Secondary | ICD-10-CM | POA: Diagnosis not present

## 2013-08-15 DIAGNOSIS — F329 Major depressive disorder, single episode, unspecified: Secondary | ICD-10-CM | POA: Insufficient documentation

## 2013-08-15 DIAGNOSIS — G4700 Insomnia, unspecified: Secondary | ICD-10-CM | POA: Diagnosis not present

## 2013-08-15 DIAGNOSIS — Z87828 Personal history of other (healed) physical injury and trauma: Secondary | ICD-10-CM | POA: Insufficient documentation

## 2013-08-15 DIAGNOSIS — Z79899 Other long term (current) drug therapy: Secondary | ICD-10-CM | POA: Diagnosis not present

## 2013-08-15 DIAGNOSIS — Z8679 Personal history of other diseases of the circulatory system: Secondary | ICD-10-CM | POA: Insufficient documentation

## 2013-08-15 DIAGNOSIS — G8929 Other chronic pain: Secondary | ICD-10-CM | POA: Insufficient documentation

## 2013-08-15 LAB — CBC WITH DIFFERENTIAL/PLATELET
Basophils Absolute: 0 10*3/uL (ref 0.0–0.1)
Basophils Relative: 0 % (ref 0–1)
Eosinophils Absolute: 0.2 10*3/uL (ref 0.0–0.7)
Eosinophils Relative: 2 % (ref 0–5)
HCT: 45.5 % (ref 36.0–46.0)
Hemoglobin: 15.9 g/dL — ABNORMAL HIGH (ref 12.0–15.0)
Lymphocytes Relative: 21 % (ref 12–46)
Lymphs Abs: 2.6 10*3/uL (ref 0.7–4.0)
MCH: 31.7 pg (ref 26.0–34.0)
MCHC: 34.9 g/dL (ref 30.0–36.0)
MCV: 90.6 fL (ref 78.0–100.0)
Monocytes Absolute: 0.6 10*3/uL (ref 0.1–1.0)
Monocytes Relative: 5 % (ref 3–12)
Neutro Abs: 8.9 10*3/uL — ABNORMAL HIGH (ref 1.7–7.7)
Neutrophils Relative %: 72 % (ref 43–77)
Platelets: 415 10*3/uL — ABNORMAL HIGH (ref 150–400)
RBC: 5.02 MIL/uL (ref 3.87–5.11)
RDW: 12.6 % (ref 11.5–15.5)
WBC: 12.3 10*3/uL — ABNORMAL HIGH (ref 4.0–10.5)

## 2013-08-15 LAB — URINALYSIS, ROUTINE W REFLEX MICROSCOPIC
Glucose, UA: 500 mg/dL — AB
Ketones, ur: 15 mg/dL — AB
Nitrite: NEGATIVE
Protein, ur: 100 mg/dL — AB
Specific Gravity, Urine: 1.033 — ABNORMAL HIGH (ref 1.005–1.030)
Urobilinogen, UA: 0.2 mg/dL (ref 0.0–1.0)
pH: 5 (ref 5.0–8.0)

## 2013-08-15 LAB — BASIC METABOLIC PANEL
Anion gap: 16 — ABNORMAL HIGH (ref 5–15)
BUN: 9 mg/dL (ref 6–23)
CO2: 26 mEq/L (ref 19–32)
Calcium: 10.1 mg/dL (ref 8.4–10.5)
Chloride: 100 mEq/L (ref 96–112)
Creatinine, Ser: 0.82 mg/dL (ref 0.50–1.10)
GFR calc Af Amer: >90 mL/min (ref >90)
GFR calc non Af Amer: 88 mL/min — ABNORMAL LOW (ref >90)
Glucose, Bld: 118 mg/dL — ABNORMAL HIGH (ref 70–99)
Potassium: 3.4 mEq/L — ABNORMAL LOW (ref 3.7–5.3)
Sodium: 142 mEq/L (ref 137–147)

## 2013-08-15 LAB — URINE MICROSCOPIC-ADD ON

## 2013-08-15 NOTE — ED Notes (Signed)
Patient completed a round of Cipro for UTI.  Over the past several days she started feeling bad again.  Pain in her lower back, denies difficulty urinating but does not go as often.  States she has been getting dizzy at times while walking and feels like she is going to black out

## 2013-08-16 ENCOUNTER — Emergency Department (HOSPITAL_COMMUNITY): Payer: 59

## 2013-08-16 ENCOUNTER — Emergency Department (HOSPITAL_COMMUNITY)
Admission: EM | Admit: 2013-08-16 | Discharge: 2013-08-16 | Disposition: A | Discharge status: Home / Self Care | Payer: 59 | Attending: Emergency Medicine | Admitting: Emergency Medicine

## 2013-08-16 ENCOUNTER — Other Ambulatory Visit: Payer: Self-pay | Admitting: Family Medicine

## 2013-08-16 DIAGNOSIS — N390 Urinary tract infection, site not specified: Secondary | ICD-10-CM

## 2013-08-16 DIAGNOSIS — G8929 Other chronic pain: Secondary | ICD-10-CM

## 2013-08-16 DIAGNOSIS — R109 Unspecified abdominal pain: Secondary | ICD-10-CM

## 2013-08-16 DIAGNOSIS — G894 Chronic pain syndrome: Secondary | ICD-10-CM

## 2013-08-16 MED ORDER — ONDANSETRON 4 MG PO TBDP
4.0000 mg | ORAL_TABLET | Freq: Once | ORAL | Status: AC
  Administered 2013-08-16: 4 mg via ORAL
  Filled 2013-08-16: qty 1

## 2013-08-16 MED ORDER — NITROFURANTOIN MONOHYD MACRO 100 MG PO CAPS: 100.0000 mg | ORAL_CAPSULE | Freq: Two times a day (BID) | ORAL | Status: DC

## 2013-08-16 MED ORDER — PHENAZOPYRIDINE HCL 200 MG PO TABS: 200.0000 mg | ORAL_TABLET | Freq: Three times a day (TID) | ORAL | Status: DC

## 2013-08-16 MED ORDER — ONDANSETRON 4 MG PO TBDP: 4.0000 mg | ORAL_TABLET | Freq: Three times a day (TID) | ORAL | Status: DC | PRN

## 2013-08-16 MED ORDER — FENTANYL CITRATE 0.05 MG/ML IJ SOLN
50.0000 ug | Freq: Once | INTRAMUSCULAR | Status: AC
  Administered 2013-08-16: 50 ug via INTRAMUSCULAR
  Filled 2013-08-16: qty 2

## 2013-08-16 NOTE — ED Provider Notes (Signed)
CSN: [id number]     Arrival date & time 08/15/13  2052 History   First MD Initiated Contact with Patient 08/16/13 0144     Chief Complaint  Patient presents with  . Urinary Frequency     (Consider location/radiation/quality/duration/timing/severity/associated sxs/prior Treatment) HPI Comments: Patient presents today with a chief complaint of dysuria,urgency, and right flank pain that has been present for the past three days.  She reports that she recently completed Cipro approximately 2 weeks for a UTI.  She states that her symptoms improved, but have now returned.  She denies any increased urinary frequency or hematuria.  However, she states that she feels that the right flank pain that she is having feels similar to the pain that she has had in the past with a kidney stone.  She reports that the right flank pain radiates to the RLQ of her abdomen. She has taken Ultram for the pain, but does not feel that it helps.  She denies fever, chills, nausea, vomiting, or diarrhea.  Review of the chart shows that the patient has been seen by her PCP for similar symptoms one week ago and also on 07/18/13.    The history is provided by the patient.    Past Medical History  Diagnosis Date  . Diabetes mellitus   . Fibromyalgia   . Hyperlipidemia   . Abdominal pain   . Nausea & vomiting   . Biliary dyskinesia   . Depression   . Joint pain   . Skin rash     due to medications  . Post traumatic stress disorder (PTSD)   . Sleep trouble   . Major depressive disorder   . Cervical strain   . Closed head injury 2010  . Renal stones   . Migraine     hx of none recent  . Arthritis   . Hypoglycemia    Past Surgical History  Procedure Laterality Date  . Lipoma removed  yrs ago  . Knee surgery Left 2012  . Cesarean section  1997, 1999  . Cholecystectomy  02/10/2011    Procedure: LAPAROSCOPIC CHOLECYSTECTOMY WITH INTRAOPERATIVE CHOLANGIOGRAM;  Surgeon: Judieth Keens, DO;  Location: [facility];   Service: General;  Laterality: N/A;  . Robotic assisted laparoscopic lysis of adhesion N/A 03/04/2012    Procedure: ROBOTIC ASSISTED LAPAROSCOPIC LYSIS OF EXTENSIVE ADHESIONS;  Surgeon: Claiborne Billings A. Pamala Hurry, MD;  Location: [facility];  Service: Gynecology;  Laterality: N/A;  . Tonsillectomy  2001  . Abdominal hysterectomy  2001  . Eye surgery  2006    laser eye surgery left eye  . Exploratory laparomty  Mar 04, 2012  . Laparoscopic appendectomy N/A 10/12/2012    Procedure: DIAGNOSTIC APPENDECTOMY LAPAROSCOPIC;  Surgeon: Madilyn Hook, DO;  Location: [facility];  Service: General;  Laterality: N/A;  . Appendectomy     Family History  Problem Relation Age of Onset  . Cancer Father     lymphoma  . Nephrolithiasis Father   . Vascular Disease Father   . Cancer Maternal Grandmother     colon  . Hypertension Mother   . Heart disease Mother   . Cataracts Mother   . Diabetes Brother   . Mental illness Maternal Grandfather   . Cancer Maternal Grandfather   . Heart disease Paternal Grandmother   . Heart disease Paternal Grandfather    History  Substance Use Topics  . Smoking status: Never Smoker   . Smokeless tobacco: Never Used  . Alcohol Use: No   OB  History   Grav Para Term Preterm Abortions TAB SAB Ect Mult Living                 Review of Systems  All other systems reviewed and are negative.     Allergies  Meloxicam; Cephalexin; and Ibuprofen  Home Medications   Prior to Admission medications   Medication Sig Start Date End Date Taking? Authorizing Provider  aspirin 81 MG tablet Take 81 mg by mouth daily.    Historical Provider, MD  atorvastatin (LIPITOR) 40 MG tablet Take 1 tablet (40 mg total) by mouth daily. 05/26/13   Nena Polio, PA-C  busPIRone (BUSPAR) 10 MG tablet Take 1 tablet (10 mg total) by mouth 3 (three) times daily. 07/19/13   Madison Hickman, NP  citalopram (CELEXA) 40 MG tablet Take 1 tablet (40 mg total) by mouth daily. 07/19/13   Madison Hickman, NP   cyclobenzaprine (FLEXERIL) 5 MG tablet Take 5 mg by mouth 3 (three) times daily as needed for muscle spasms.  07/12/13   Historical Provider, MD  hyoscyamine (LEVSIN/SL) 0.125 MG SL tablet Place 1 tablet (0.125 mg total) under the tongue every 4 (four) hours as needed. 08/09/13   [name], MD  insulin aspart (NOVOLOG) 100 UNIT/ML injection Inject 8 Units into the skin 3 (three) times daily with meals. 05/26/13   Nena Polio, PA-C  insulin glargine (LANTUS) 100 UNIT/ML injection Inject 0.28 mLs (28 Units total) into the skin every evening. 05/26/13   Nena Polio, PA-C  lidocaine (LIDODERM) 5 % Place 1 patch onto the skin daily as needed (for pain.). Remove & Discard patch within 12 hours or as directed by MD 05/26/13   Nena Polio, PA-C  ondansetron (ZOFRAN) 8 MG tablet Take 1 tablet (8 mg total) by mouth every 8 (eight) hours as needed for nausea or vomiting. 07/18/13   Wardell Honour, MD  promethazine (PHENERGAN) 12.5 MG tablet Take 1 tablet (12.5 mg total) by mouth every 8 (eight) hours as needed for nausea or vomiting. 07/18/13   Wardell Honour, MD  ranitidine (ZANTAC) 150 MG capsule Take 1 capsule (150 mg total) by mouth 2 (two) times daily. 07/18/13   Wardell Honour, MD  traMADol (ULTRAM) 50 MG tablet Take 1 tablet (50 mg total) by mouth every 8 (eight) hours as needed. 08/09/13   [name], MD  zolpidem (AMBIEN) 10 MG tablet Take 1 tablet (10 mg total) by mouth at bedtime as needed for sleep. 07/19/13 08/18/13  Meghan Blankmann, NP   BP 120/72  Pulse 88  Temp(Src) 98.6 F (37 C) (Oral)  Resp 13  Ht 5\' 4"  (1.626 m)  Wt 159 lb 11.2 oz (72.439 kg)  BMI 27.40 kg/m2  SpO2 100% Physical Exam  Nursing note and vitals reviewed. Constitutional: She appears well-developed and well-nourished.  HENT:  Head: Normocephalic and atraumatic.  Mouth/Throat: Oropharynx is clear and moist.  Neck: Normal range of motion. Neck supple.  Cardiovascular: Normal rate, regular rhythm and normal heart  sounds.   Pulmonary/Chest: Effort normal and breath sounds normal.  Abdominal: Soft. Bowel sounds are normal. She exhibits no distension and no mass. There is CVA tenderness. There is no rebound and no guarding.  Right CVA tenderness  Musculoskeletal: Normal range of motion.  Neurological: She is alert.  Skin: Skin is warm and dry.  Psychiatric: She has a normal mood and affect.    ED Course  Procedures (including critical care time) Labs Review Labs Reviewed  CBC WITH DIFFERENTIAL -  Abnormal; Notable for the following:    WBC 12.3 (*)    Hemoglobin 15.9 (*)    Platelets 415 (*)    Neutro Abs 8.9 (*)    All other components within normal limits  BASIC METABOLIC PANEL - Abnormal; Notable for the following:    Potassium 3.4 (*)    Glucose, Bld 118 (*)    GFR calc non Af Amer 88 (*)    Anion gap 16 (*)    All other components within normal limits  URINALYSIS, ROUTINE W REFLEX MICROSCOPIC - Abnormal; Notable for the following:    Color, Urine AMBER (*)    APPearance CLOUDY (*)    Specific Gravity, Urine 1.033 (*)    Glucose, UA 500 (*)    Hgb urine dipstick SMALL (*)    Bilirubin Urine SMALL (*)    Ketones, ur 15 (*)    Protein, ur 100 (*)    Leukocytes, UA SMALL (*)    All other components within normal limits  URINE MICROSCOPIC-ADD ON - Abnormal; Notable for the following:    Squamous Epithelial / LPF MANY (*)    Bacteria, UA FEW (*)    All other components within normal limits    Imaging Review US Renal  08/16/2013   CLINICAL DATA:  Flank pain.  History of prior stones.  EXAM: RENAL/URINARY TRACT ULTRASOUND COMPLETE  COMPARISON:  CT abdomen and pelvis 12/27/2012  FINDINGS: Right Kidney:  Length: 12.4 cm. Echogenicity within normal limits. No mass or hydronephrosis visualized.  Left Kidney:  Length: 10.1 cm. Echogenicity within normal limits. No mass or hydronephrosis visualized.  Bladder:  Appears normal for degree of bladder distention.  IMPRESSION: Normal ultrasound  appearance of the kidneys and bladder. No renal stones identified.   Electronically Signed   By: Lucienne Capers M.D.   On: 08/16/2013 03:04     EKG Interpretation None      MDM   Final diagnoses:  None   Patient presenting with urinary symptoms and chronic pain of her right flank.  Patient afebrile.  Labs unremarkable.  UA showing small leukocytes and few bacteria, but is a contaminated sample with many squamous cells.  Urine sent for culture.  Renal ultrasound not showing any renal stones.  Feel that the patient is stable for discharge.  Patient instructed to follow up with PCP.  Return precautions given.    Hyman Bible, PA-C 08/19/13 [phone]

## 2013-08-16 NOTE — Telephone Encounter (Signed)
Pt seen in ED last night   Issue resolved.

## 2013-08-16 NOTE — Telephone Encounter (Signed)
I will order pain clinic referral

## 2013-08-16 NOTE — ED Notes (Signed)
Pt A&Ox4, ambulatory at d/c with steady gait, NAD 

## 2013-08-17 LAB — URINE CULTURE: Colony Count: 90000

## 2013-08-19 NOTE — ED Provider Notes (Signed)
Medical screening examination/treatment/procedure(s) were performed by non-physician practitioner and as supervising physician I was immediately available for consultation/collaboration.   EKG Interpretation None       Sabreena Vogan K Ricky Gallery-Rasch, MD 08/19/13 2312

## 2013-08-25 ENCOUNTER — Ambulatory Visit (HOSPITAL_COMMUNITY): Payer: Self-pay | Admitting: Psychiatry

## 2013-09-10 ENCOUNTER — Ambulatory Visit (INDEPENDENT_AMBULATORY_CARE_PROVIDER_SITE_OTHER): Payer: 59 | Admitting: Family Medicine

## 2013-09-10 ENCOUNTER — Ambulatory Visit (INDEPENDENT_AMBULATORY_CARE_PROVIDER_SITE_OTHER): Payer: 59

## 2013-09-10 VITALS — BP 118/70 | HR 106 | Temp 98.0°F | Resp 16 | Ht 64.0 in | Wt 160.2 lb

## 2013-09-10 DIAGNOSIS — M25569 Pain in unspecified knee: Secondary | ICD-10-CM

## 2013-09-10 DIAGNOSIS — M25561 Pain in right knee: Secondary | ICD-10-CM

## 2013-09-10 DIAGNOSIS — G4700 Insomnia, unspecified: Secondary | ICD-10-CM

## 2013-09-10 MED ORDER — CLONAZEPAM 1 MG PO TABS: 1.0000 mg | ORAL_TABLET | Freq: Every day | ORAL | Status: DC

## 2013-09-10 NOTE — Progress Notes (Signed)
Subjective:    Patient ID: Maureen [name], female    DOB: 10-Jan-1971, 43 y.o.   MRN: [id number]   PCP: [name], MD  Chief Complaint  Patient presents with  . Anxiety    pt would like to talk about her anxiety and maybe meds for it  . Knee Pain    right knee pain; little swelling; no OTC pain meds    Patient Active Problem List   Diagnosis Date Noted  . Major depression 05/22/2013  . MDD (major depressive disorder) 05/22/2013  . Chest pain 05/18/2012  . Endometriosis 12/08/2011  . Arthritis of left knee 12/08/2011  . Diabetic retinopathy 12/08/2011  . Fibromyalgia 10/23/2011  . DM type 1 (diabetes mellitus, type 1) 10/13/2011    Prior to Admission medications   Medication Sig Start Date End Date Taking? Authorizing Provider  aspirin 81 MG tablet Take 81 mg by mouth daily.   Yes Historical Provider, MD  atorvastatin (LIPITOR) 40 MG tablet Take 1 tablet (40 mg total) by mouth daily. 05/26/13  Yes Nena Polio, PA-C  citalopram (CELEXA) 40 MG tablet Take 1 tablet (40 mg total) by mouth daily. 07/19/13  Yes Meghan Blankmann, NP  cyclobenzaprine (FLEXERIL) 5 MG tablet Take 5 mg by mouth 3 (three) times daily as needed for muscle spasms.  07/12/13  Yes Historical Provider, MD  insulin aspart (NOVOLOG) 100 UNIT/ML injection Inject 8 Units into the skin 3 (three) times daily with meals. 05/26/13  Yes Nena Polio, PA-C  insulin glargine (LANTUS) 100 UNIT/ML injection Inject 0.28 mLs (28 Units total) into the skin every evening. 05/26/13  Yes Nena Polio, PA-C  lidocaine (LIDODERM) 5 % Place 1 patch onto the skin daily as needed (for pain.). Remove & Discard patch within 12 hours or as directed by MD 05/26/13  Yes Nena Polio, PA-C    HPI  [place] for prescriptions (?Dr. Mateo Flow). Has an appointment next week. [facility]  Difficulty sleeping.  Crying.  Gets SOB when she gets overwhelmed.  "Everyday there's something new and crazy going on." Therapist  believes that she has chronic anxiety.  Never had symptoms this bad before.  This year has been crazy.  "My husband was living this whole other life.  The girl he was involved with had me arrested on a lie."  "My oldest son was graduating." "My sons are suffering with anxiety now, too." Her son deferred a full academic scholarship to [employer] to stay home with her. "I just don't want to be stuck like this." No thoughts of harming herself or others.  Recently tried Ambien (ineffective recently, worked previously), trazodone (200 mg ineffective during recent hospitalization), hydroxyzine, buspirone.  Fell while walking down a hill while out with her boys 1-2 weeks ago.  Skinned both her knees. RIGHT knee still painful under the patella. Some intermittent swelling. History of knee arthritis.  Review of Systems As above.    Objective:   Physical Exam  Constitutional: She is oriented to person, place, and time. She appears well-developed and well-nourished. She is active and cooperative. No distress.  BP 118/70  Pulse 106  Temp(Src) 98 F (36.7 C) (Oral)  Resp 16  Ht 5\' 4"  (1.626 m)  Wt 160 lb 3.2 oz (72.666 kg)  BMI 27.48 kg/m2  SpO2 98%   Eyes: No scleral icterus.  Cardiovascular: Regular rhythm and normal heart sounds.   Pulmonary/Chest: Effort normal.  Musculoskeletal:       Right knee: She exhibits normal range  of motion, no swelling, no effusion, no ecchymosis, no deformity, no laceration, no erythema, normal alignment, no LCL laxity, normal patellar mobility, no bony tenderness, normal meniscus and no MCL laxity. Tenderness found. No medial joint line, no lateral joint line, no MCL and no LCL tenderness noted. Patellar tendon tenderness: large eschar over patellar tendon consistent with resolving abrasion.       Left knee: Normal.       Legs: Resolving abrasions on the knees and anterior tibias bilaterally.  Neurological: She is alert and oriented to person, place, and time.    Skin: Skin is warm and dry.  Psychiatric: She has a normal mood and affect. Her speech is normal and behavior is normal. Judgment normal. Depressed: a little tearful. She expresses no homicidal and no suicidal ideation.   RIGHT KNEE: [facility] reading (PRIMARY) by  Dr. Marin Comment. Minimal degenerative changes. Otherwise normal knee.         Assessment & Plan:  1. Knee pain, acute, right Anticipatory guidance. Rest. - DG Knee Complete 4 Views Right; Future  2. Insomnia Continue citalopram and therapy visits. Keep appointment with psychiatrist next week. Short term clonazepam. - clonazePAM (KLONOPIN) 1 MG tablet; Take 1 tablet (1 mg total) by mouth at bedtime.  Dispense: 10 tablet; Refill: 0  Discussed with Dr. Marin Comment.  Fara Chute, PA-C Physician Assistant-Certified [facility]

## 2013-09-10 NOTE — Patient Instructions (Signed)
Take it easy on your knee.  Keep the appointment with your doctor at [facility] next week. Continue with counseling. Continue the Celexa (citalopram).

## 2013-09-15 ENCOUNTER — Encounter (HOSPITAL_COMMUNITY): Payer: Self-pay | Admitting: Psychiatry

## 2013-09-15 ENCOUNTER — Ambulatory Visit (INDEPENDENT_AMBULATORY_CARE_PROVIDER_SITE_OTHER): Payer: 59 | Admitting: Psychiatry

## 2013-09-15 VITALS — BP 121/70 | HR 88 | Ht 64.0 in | Wt 159.0 lb

## 2013-09-15 DIAGNOSIS — F331 Major depressive disorder, recurrent, moderate: Secondary | ICD-10-CM

## 2013-09-15 DIAGNOSIS — F411 Generalized anxiety disorder: Secondary | ICD-10-CM

## 2013-09-15 DIAGNOSIS — G4700 Insomnia, unspecified: Secondary | ICD-10-CM

## 2013-09-15 MED ORDER — CITALOPRAM HYDROBROMIDE 40 MG PO TABS: 40.0000 mg | ORAL_TABLET | Freq: Every day | ORAL | Status: DC

## 2013-09-15 MED ORDER — HYDROXYZINE PAMOATE 25 MG PO CAPS: 25.0000 mg | ORAL_CAPSULE | Freq: Three times a day (TID) | ORAL | Status: DC | PRN

## 2013-09-15 NOTE — Progress Notes (Signed)
   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 Sleep is poor; appetite is better. She is trying to pay attention to her diet. She tried clonazepam from PCP, but only short term. She has anxiety about sleeping and causes more anxiety. She is not crying as much. Depression 5/10, Anxiety 9/10. She has supportive friend. She still is living with husband, who is cheating on her and who is having a baby with another person. She goes to [facility], every 3 weeks.   There were no vitals filed for this visit.  Mental Status Examination  Appearance: casual  Alert: Yes Attention: fair  Cooperative: Yes Eye Contact: Fair Speech: wdl  Psychomotor Activity: Psychomotor Retardation Memory/Concentration: fair  Oriented: time/date and situation Mood: Anxious and Depressed Affect: Depressed Thought Processes and Associations: Linear Fund of Knowledge: Fair Thought Content: preoccupations Insight: Fair Judgement: Fair  Diagnosis:  MDD, recurrent, moderate GAD Insomnia  Treatment Plan:  Citalopram 20 mg x2 po daily for depression Hydroxyzine 25 mg tid prn sleep/anxiety   Madison Hickman, NP

## 2013-09-29 ENCOUNTER — Encounter (HOSPITAL_COMMUNITY): Payer: Self-pay | Admitting: Licensed Clinical Social Worker

## 2013-09-29 ENCOUNTER — Ambulatory Visit (INDEPENDENT_AMBULATORY_CARE_PROVIDER_SITE_OTHER): Payer: 59 | Admitting: Licensed Clinical Social Worker

## 2013-09-29 DIAGNOSIS — F329 Major depressive disorder, single episode, unspecified: Secondary | ICD-10-CM
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
## 2013-10-03 ENCOUNTER — Ambulatory Visit (INDEPENDENT_AMBULATORY_CARE_PROVIDER_SITE_OTHER): Payer: 59 | Admitting: Licensed Clinical Social Worker

## 2013-10-03 DIAGNOSIS — F321 Major depressive disorder, single episode, moderate: Secondary | ICD-10-CM

## 2013-10-04 NOTE — Progress Notes (Signed)
   THERAPIST PROGRESS NOTE  Session Time: 1:05PM-1:58PM   Participation Level: Active  Behavioral Response: Neat and Well GroomedAlertDepressed and Hopeless  Type of Therapy: Individual Therapy  Treatment Goals addressed: Anxiety and Coping  Interventions: Motivational Interviewing, Strength-based and Supportive  Summary: Maureen [name] is a 43 y.o. female who presents with symptoms of depression. Patient discussed her symptoms since the last session. Patient explored her emotions emotions around the recent situation with her husband. Patient reports that she would like to make a plan to leave and discussed her plan. Patient was neat and well groomed and made fair eye contact. Patient was tearful throughout the session and reports that she was tearful, "not because I am sad, but I try not to cry at h ome in front of my boys." Patient discussed how she would l ike to have a plan that she is able to stick with. Patient discussed her supportive friends and family and reports that she feels that she will feel better once she has moved. Patient reports that her husband has called her twice since the last session but she has not answered. Patient reports that she feels that not having contact with him at this time is the best decision for her emotional well-being. Patient reports that she feels that group would be a good option for he right now and is scheduled to start on October 10, 2013 at 8:45 AM. Patient denies SI/HI and psychosis..   Suicidal/Homicidal: Nowithout intent/plan  Therapist Response: LCSW assessed the patients symptoms since the last session. LCSW assessed the patient for SI and HI. LCSW listened actively and provided feedback as needed. LCSW asked clarifying questions and reflected the patients emotions during the session. LCSW completed the treatment plan and discussed the group in detail.    Plan: continue to keep appointments as scheduled, utilize mobile crisis if needed,  take medication as prescribed and begin [facility] on October 10, 2013 at 8:45AM. Patient will return to outpatient therapy upon completion of the group. .  Diagnosis: Axis I: Major Depression, single episode moderate      [name], [name], LCSW 10/04/2013

## 2013-10-06 ENCOUNTER — Telehealth (HOSPITAL_COMMUNITY): Payer: Self-pay

## 2013-10-06 DIAGNOSIS — G4700 Insomnia, unspecified: Secondary | ICD-10-CM

## 2013-10-06 MED ORDER — CLONAZEPAM 1 MG PO TABS: 1.0000 mg | ORAL_TABLET | Freq: Every day | ORAL | Status: DC

## 2013-10-08 ENCOUNTER — Emergency Department (HOSPITAL_BASED_OUTPATIENT_CLINIC_OR_DEPARTMENT_OTHER)
Admission: EM | Admit: 2013-10-08 | Discharge: 2013-10-08 | Disposition: A | Discharge status: Home / Self Care | Payer: 59 | Attending: Emergency Medicine | Admitting: Emergency Medicine

## 2013-10-08 ENCOUNTER — Ambulatory Visit (HOSPITAL_COMMUNITY)
Admission: AD | Admit: 2013-10-08 | Discharge: 2013-10-08 | Disposition: A | Discharge status: Home / Self Care | Payer: 59 | Attending: Psychiatry | Admitting: Psychiatry

## 2013-10-08 ENCOUNTER — Encounter (HOSPITAL_BASED_OUTPATIENT_CLINIC_OR_DEPARTMENT_OTHER): Payer: Self-pay | Admitting: Emergency Medicine

## 2013-10-08 DIAGNOSIS — Z87442 Personal history of urinary calculi: Secondary | ICD-10-CM | POA: Diagnosis not present

## 2013-10-08 DIAGNOSIS — Z872 Personal history of diseases of the skin and subcutaneous tissue: Secondary | ICD-10-CM | POA: Diagnosis not present

## 2013-10-08 DIAGNOSIS — F411 Generalized anxiety disorder: Secondary | ICD-10-CM | POA: Insufficient documentation

## 2013-10-08 DIAGNOSIS — IMO0001 Reserved for inherently not codable concepts without codable children: Secondary | ICD-10-CM | POA: Insufficient documentation

## 2013-10-08 DIAGNOSIS — Z7982 Long term (current) use of aspirin: Secondary | ICD-10-CM | POA: Insufficient documentation

## 2013-10-08 DIAGNOSIS — F4310 Post-traumatic stress disorder, unspecified: Secondary | ICD-10-CM | POA: Insufficient documentation

## 2013-10-08 DIAGNOSIS — E119 Type 2 diabetes mellitus without complications: Secondary | ICD-10-CM | POA: Insufficient documentation

## 2013-10-08 DIAGNOSIS — Z87828 Personal history of other (healed) physical injury and trauma: Secondary | ICD-10-CM | POA: Diagnosis not present

## 2013-10-08 DIAGNOSIS — Z794 Long term (current) use of insulin: Secondary | ICD-10-CM | POA: Diagnosis not present

## 2013-10-08 DIAGNOSIS — F322 Major depressive disorder, single episode, severe without psychotic features: Secondary | ICD-10-CM | POA: Diagnosis not present

## 2013-10-08 DIAGNOSIS — F329 Major depressive disorder, single episode, unspecified: Secondary | ICD-10-CM | POA: Insufficient documentation

## 2013-10-08 DIAGNOSIS — M129 Arthropathy, unspecified: Secondary | ICD-10-CM | POA: Diagnosis not present

## 2013-10-08 DIAGNOSIS — Z79899 Other long term (current) drug therapy: Secondary | ICD-10-CM | POA: Insufficient documentation

## 2013-10-08 DIAGNOSIS — F3289 Other specified depressive episodes: Secondary | ICD-10-CM | POA: Diagnosis not present

## 2013-10-08 DIAGNOSIS — G43909 Migraine, unspecified, not intractable, without status migrainosus: Secondary | ICD-10-CM | POA: Insufficient documentation

## 2013-10-08 DIAGNOSIS — F419 Anxiety disorder, unspecified: Secondary | ICD-10-CM

## 2013-10-08 MED ORDER — ALPRAZOLAM 0.5 MG PO TABS
1.0000 mg | ORAL_TABLET | Freq: Once | ORAL | Status: AC
  Administered 2013-10-08: 1 mg via ORAL
  Filled 2013-10-08: qty 2

## 2013-10-08 MED ORDER — ALPRAZOLAM 0.5 MG PO TABS: 0.5000 mg | ORAL_TABLET | Freq: Three times a day (TID) | ORAL | Status: DC | PRN

## 2013-10-08 NOTE — ED Notes (Signed)
Pt given warm blanket.

## 2013-10-08 NOTE — Discharge Instructions (Signed)
Generalized Anxiety Disorder Generalized anxiety disorder (GAD) is a mental disorder. It interferes with life functions, including relationships, work, and school. GAD is different from normal anxiety, which everyone experiences at some point in their lives in response to specific life events and activities. Normal anxiety actually helps us prepare for and get through these life events and activities. Normal anxiety goes away after the event or activity is over.  GAD causes anxiety that is not necessarily related to specific events or activities. It also causes excess anxiety in proportion to specific events or activities. The anxiety associated with GAD is also difficult to control. GAD can vary from mild to severe. People with severe GAD can have intense waves of anxiety with physical symptoms (panic attacks).  SYMPTOMS The anxiety and worry associated with GAD are difficult to control. This anxiety and worry are related to many life events and activities and also occur more days than not for 6 months or longer. People with GAD also have three or more of the following symptoms (one or more in children):  Restlessness.   Fatigue.  Difficulty concentrating.   Irritability.  Muscle tension.  Difficulty sleeping or unsatisfying sleep. DIAGNOSIS GAD is diagnosed through an assessment by your health care provider. Your health care provider will ask you questions aboutyour mood,physical symptoms, and events in your life. Your health care provider may ask you about your medical history and use of alcohol or drugs, including prescription medicines. Your health care provider may also do a physical exam and blood tests. Certain medical conditions and the use of certain substances can cause symptoms similar to those associated with GAD. Your health care provider may refer you to a mental health specialist for further evaluation. TREATMENT The following therapies are usually used to treat GAD:    Medication. Antidepressant medication usually is prescribed for long-term daily control. Antianxiety medicines may be added in severe cases, especially when panic attacks occur.   Talk therapy (psychotherapy). Certain types of talk therapy can be helpful in treating GAD by providing support, education, and guidance. A form of talk therapy called cognitive behavioral therapy can teach you healthy ways to think about and react to daily life events and activities.  Stress managementtechniques. These include yoga, meditation, and exercise and can be very helpful when they are practiced regularly. A mental health specialist can help determine which treatment is best for you. Some people see improvement with one therapy. However, other people require a combination of therapies. Document Released: 05/02/2012 Document Revised: 05/22/2013 Document Reviewed: 05/02/2012 Exit[employer] Patient Information 2015 [employer]. This information is not intended to replace advice given to you by your health care provider. Make sure you discuss any questions you have with your health care provider.  
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
## 2013-10-08 NOTE — ED Notes (Signed)
Pt repots hx of anxiety and being admitted to [facility].  States that she has not been sleeping and has had increased anxiety this weekend.  Pt requesting medication to help her sleep until she can see her psychiatry.

## 2013-10-08 NOTE — ED Provider Notes (Signed)
CSN: [id number]     Arrival date & time 10/08/13  1616 History   First MD Initiated Contact with Patient 10/08/13 1627     Chief Complaint  Patient presents with  . Anxiety     (Consider location/radiation/quality/duration/timing/severity/associated sxs/prior Treatment) Patient is a 43 y.o. female presenting with anxiety. The history is provided by the patient. No language interpreter was used.  Anxiety This is a chronic problem. Pertinent negatives include no chills or fever. Associated symptoms comments: The patient reports a longstanding history of anxiety, treated with clonazepam by her doctor. She states that she feels this medication is no longer working and that her anxiety is heightened recently. She denies SI/HI. She states that her doctor refilled her clonazepam 2 days ago but that she does not feel controlled and presents for re-evaluation. She denies any other issues today..    Past Medical History  Diagnosis Date  . Diabetes mellitus   . Fibromyalgia   . Hyperlipidemia   . Abdominal pain   . Nausea & vomiting   . Biliary dyskinesia   . Depression   . Joint pain   . Skin rash     due to medications  . Post traumatic stress disorder (PTSD)   . Sleep trouble   . Major depressive disorder   . Cervical strain   . Closed head injury 2010  . Renal stones   . Migraine     hx of none recent  . Arthritis   . Hypoglycemia    Past Surgical History  Procedure Laterality Date  . Lipoma removed  yrs ago  . Knee surgery Left 2012  . Cesarean section  1997, 1999  . Cholecystectomy  02/10/2011    Procedure: LAPAROSCOPIC CHOLECYSTECTOMY WITH INTRAOPERATIVE CHOLANGIOGRAM;  Surgeon: Judieth Keens, DO;  Location: [place];  Service: General;  Laterality: N/A;  . Robotic assisted laparoscopic lysis of adhesion N/A 03/04/2012    Procedure: ROBOTIC ASSISTED LAPAROSCOPIC LYSIS OF EXTENSIVE ADHESIONS;  Surgeon: Claiborne Billings A. Pamala Hurry, MD;  Location: [facility];  Service: Gynecology;   Laterality: N/A;  . Tonsillectomy  2001  . Abdominal hysterectomy  2001  . Eye surgery  2006    laser eye surgery left eye  . Exploratory laparomty  Mar 04, 2012  . Laparoscopic appendectomy N/A 10/12/2012    Procedure: DIAGNOSTIC APPENDECTOMY LAPAROSCOPIC;  Surgeon: Madilyn Hook, DO;  Location: [facility];  Service: General;  Laterality: N/A;  . Appendectomy     Family History  Problem Relation Age of Onset  . Cancer Father     lymphoma  . Nephrolithiasis Father   . Vascular Disease Father   . Cancer Maternal Grandmother     colon  . Hypertension Mother   . Heart disease Mother   . Cataracts Mother   . Diabetes Brother   . Mental illness Maternal Grandfather   . Cancer Maternal Grandfather   . Heart disease Paternal Grandmother   . Heart disease Paternal Grandfather    History  Substance Use Topics  . Smoking status: Never Smoker   . Smokeless tobacco: Never Used  . Alcohol Use: No   OB History   Grav Para Term Preterm Abortions TAB SAB Ect Mult Living                 Review of Systems  Constitutional: Negative for fever and chills.  HENT: Negative.   Respiratory: Negative.   Cardiovascular: Negative.   Gastrointestinal: Negative.   Musculoskeletal: Negative.   Skin: Negative.  Neurological: Negative.   Psychiatric/Behavioral: Negative for suicidal ideas, hallucinations and confusion. The patient is nervous/anxious.       Allergies  Meloxicam; Cephalexin; and Ibuprofen  Home Medications   Prior to Admission medications   Medication Sig Start Date End Date Taking? Authorizing Provider  aspirin 81 MG tablet Take 81 mg by mouth daily.    Historical Provider, MD  atorvastatin (LIPITOR) 40 MG tablet Take 1 tablet (40 mg total) by mouth daily. 05/26/13   Nena Polio, PA-C  citalopram (CELEXA) 40 MG tablet Take 1 tablet (40 mg total) by mouth daily. 09/15/13   Madison Hickman, NP  clonazePAM (KLONOPIN) 1 MG tablet Take 1 tablet (1 mg total) by mouth at bedtime.  10/06/13   Madison Hickman, NP  cyclobenzaprine (FLEXERIL) 5 MG tablet Take 5 mg by mouth 3 (three) times daily as needed for muscle spasms.  07/12/13   Historical Provider, MD  hydrOXYzine (VISTARIL) 25 MG capsule Take 1 capsule (25 mg total) by mouth 3 (three) times daily as needed for anxiety (anxiety/sleep). 09/15/13   Madison Hickman, NP  insulin aspart (NOVOLOG) 100 UNIT/ML injection Inject 8 Units into the skin 3 (three) times daily with meals. 05/26/13   Nena Polio, PA-C  insulin glargine (LANTUS) 100 UNIT/ML injection Inject 0.28 mLs (28 Units total) into the skin every evening. 05/26/13   Nena Polio, PA-C  lidocaine (LIDODERM) 5 % Place 1 patch onto the skin daily as needed (for pain.). Remove & Discard patch within 12 hours or as directed by MD 05/26/13   Nena Polio, PA-C   Pulse 91  Temp(Src) 98.3 F (36.8 C) (Oral)  Resp 18  SpO2 100% Physical Exam  Constitutional: She is oriented to person, place, and time. She appears well-developed and well-nourished.  HENT:  Head: Normocephalic.  Neck: Normal range of motion. Neck supple.  Cardiovascular: Normal rate and regular rhythm.   Pulmonary/Chest: Effort normal and breath sounds normal.  Abdominal: Soft. Bowel sounds are normal. There is no tenderness. There is no rebound and no guarding.  Musculoskeletal: Normal range of motion.  Neurological: She is alert and oriented to person, place, and time.  Skin: Skin is warm and dry. No rash noted.  Psychiatric: She has a normal mood and affect.    ED Course  Procedures (including critical care time) Labs Review Labs Reviewed - No data to display  Imaging Review No results found.   EKG Interpretation None      MDM   Final diagnoses:  None    1. Anxiety  [facility] verified that the patient obtained #30 1 mg Clonazepam 2 days ago. She did not bring the Rx with her today. Discussed that we would give her another medication but for a very few pills to last until she  could follow up with her doctor tomorrow. She was given Xanax 1mg  in the department and reports she feels that it is more effective. She continues to deny SI/HI or any other need or concern today. Family will pick her up and it is felt she is stable for discharge home.     Dewaine Oats, PA-C 10/11/13 1058

## 2013-10-09 ENCOUNTER — Telehealth (HOSPITAL_COMMUNITY): Payer: Self-pay | Admitting: *Deleted

## 2013-10-09 ENCOUNTER — Encounter (HOSPITAL_COMMUNITY): Payer: Self-pay | Admitting: Emergency Medicine

## 2013-10-09 ENCOUNTER — Emergency Department (HOSPITAL_COMMUNITY)
Admission: EM | Admit: 2013-10-09 | Discharge: 2013-10-10 | Disposition: A | Discharge status: Other Acute Inpatient Hospital | Payer: 59 | Attending: Emergency Medicine | Admitting: Emergency Medicine

## 2013-10-09 DIAGNOSIS — E1142 Type 2 diabetes mellitus with diabetic polyneuropathy: Secondary | ICD-10-CM | POA: Diagnosis not present

## 2013-10-09 DIAGNOSIS — F332 Major depressive disorder, recurrent severe without psychotic features: Secondary | ICD-10-CM | POA: Diagnosis not present

## 2013-10-09 DIAGNOSIS — E1049 Type 1 diabetes mellitus with other diabetic neurological complication: Secondary | ICD-10-CM | POA: Diagnosis not present

## 2013-10-09 DIAGNOSIS — G43909 Migraine, unspecified, not intractable, without status migrainosus: Secondary | ICD-10-CM | POA: Diagnosis not present

## 2013-10-09 DIAGNOSIS — F1310 Sedative, hypnotic or anxiolytic abuse, uncomplicated: Secondary | ICD-10-CM | POA: Insufficient documentation

## 2013-10-09 DIAGNOSIS — Z7982 Long term (current) use of aspirin: Secondary | ICD-10-CM | POA: Diagnosis not present

## 2013-10-09 DIAGNOSIS — F329 Major depressive disorder, single episode, unspecified: Secondary | ICD-10-CM | POA: Diagnosis present

## 2013-10-09 DIAGNOSIS — Z87442 Personal history of urinary calculi: Secondary | ICD-10-CM | POA: Diagnosis not present

## 2013-10-09 DIAGNOSIS — E785 Hyperlipidemia, unspecified: Secondary | ICD-10-CM | POA: Insufficient documentation

## 2013-10-09 DIAGNOSIS — Z8719 Personal history of other diseases of the digestive system: Secondary | ICD-10-CM | POA: Insufficient documentation

## 2013-10-09 DIAGNOSIS — Z794 Long term (current) use of insulin: Secondary | ICD-10-CM | POA: Diagnosis not present

## 2013-10-09 DIAGNOSIS — Z87828 Personal history of other (healed) physical injury and trauma: Secondary | ICD-10-CM | POA: Insufficient documentation

## 2013-10-09 DIAGNOSIS — R45850 Homicidal ideations: Secondary | ICD-10-CM

## 2013-10-09 DIAGNOSIS — Z79899 Other long term (current) drug therapy: Secondary | ICD-10-CM | POA: Insufficient documentation

## 2013-10-09 DIAGNOSIS — R45851 Suicidal ideations: Secondary | ICD-10-CM | POA: Diagnosis present

## 2013-10-09 LAB — CBG MONITORING, ED
Glucose-Capillary: 354 mg/dL — ABNORMAL HIGH (ref 70–99)
Glucose-Capillary: 305 mg/dL — ABNORMAL HIGH (ref 70–99)
Glucose-Capillary: 67 mg/dL — ABNORMAL LOW (ref 70–99)
Glucose-Capillary: 74 mg/dL (ref 70–99)

## 2013-10-09 LAB — COMPREHENSIVE METABOLIC PANEL
ALT: 17 U/L (ref 0–35)
AST: 19 U/L (ref 0–37)
Albumin: 3.8 g/dL (ref 3.5–5.2)
Alkaline Phosphatase: 132 U/L — ABNORMAL HIGH (ref 39–117)
Anion gap: 15 (ref 5–15)
BUN: 9 mg/dL (ref 6–23)
CO2: 27 mEq/L (ref 19–32)
Calcium: 9.6 mg/dL (ref 8.4–10.5)
Chloride: 95 mEq/L — ABNORMAL LOW (ref 96–112)
Creatinine, Ser: 0.91 mg/dL (ref 0.50–1.10)
GFR calc Af Amer: >90 mL/min (ref >90)
GFR calc non Af Amer: 78 mL/min — ABNORMAL LOW (ref >90)
Glucose, Bld: 398 mg/dL — ABNORMAL HIGH (ref 70–99)
Potassium: 4.4 mEq/L (ref 3.7–5.3)
Sodium: 137 mEq/L (ref 137–147)
Total Bilirubin: 0.4 mg/dL (ref 0.3–1.2)
Total Protein: 8.2 g/dL (ref 6.0–8.3)

## 2013-10-09 LAB — CBC
HCT: 43 % (ref 36.0–46.0)
Hemoglobin: 14.6 g/dL (ref 12.0–15.0)
MCH: 31.5 pg (ref 26.0–34.0)
MCHC: 34 g/dL (ref 30.0–36.0)
MCV: 92.7 fL (ref 78.0–100.0)
Platelets: 431 10*3/uL — ABNORMAL HIGH (ref 150–400)
RBC: 4.64 MIL/uL (ref 3.87–5.11)
RDW: 13.9 % (ref 11.5–15.5)
WBC: 14.7 10*3/uL — ABNORMAL HIGH (ref 4.0–10.5)

## 2013-10-09 LAB — SALICYLATE LEVEL: Salicylate Lvl: <2 mg/dL — ABNORMAL LOW (ref 2.8–20.0)

## 2013-10-09 LAB — RAPID URINE DRUG SCREEN, HOSP PERFORMED
Amphetamines: NOT DETECTED
Barbiturates: NOT DETECTED
Benzodiazepines: POSITIVE — AB
Cocaine: NOT DETECTED
Opiates: NOT DETECTED
Tetrahydrocannabinol: NOT DETECTED

## 2013-10-09 LAB — ACETAMINOPHEN LEVEL: Acetaminophen (Tylenol), Serum: <15 ug/mL (ref 10–30)

## 2013-10-09 LAB — ETHANOL: Alcohol, Ethyl (B): <11 mg/dL (ref 0–11)

## 2013-10-09 MED ORDER — INSULIN GLARGINE 100 UNIT/ML ~~LOC~~ SOLN
28.0000 [IU] | Freq: Every day | SUBCUTANEOUS | Status: DC
  Administered 2013-10-09: 28 [IU] via SUBCUTANEOUS
  Filled 2013-10-09 (×2): qty 0.28

## 2013-10-09 MED ORDER — INSULIN GLARGINE 100 UNIT/ML ~~LOC~~ SOLN
4.0000 [IU] | Freq: Every day | SUBCUTANEOUS | Status: DC
  Filled 2013-10-09: qty 0.04

## 2013-10-09 MED ORDER — ZOLPIDEM TARTRATE 5 MG PO TABS
5.0000 mg | ORAL_TABLET | Freq: Every day | ORAL | Status: DC
  Administered 2013-10-09: 5 mg via ORAL
  Filled 2013-10-09: qty 1

## 2013-10-09 MED ORDER — CITALOPRAM HYDROBROMIDE 40 MG PO TABS
40.0000 mg | ORAL_TABLET | Freq: Every day | ORAL | Status: DC
  Administered 2013-10-09 – 2013-10-10 (×2): 40 mg via ORAL
  Filled 2013-10-09 (×2): qty 1

## 2013-10-09 MED ORDER — INSULIN ASPART 100 UNIT/ML ~~LOC~~ SOLN: 2.0000 [IU] | SUBCUTANEOUS | Status: DC

## 2013-10-09 MED ORDER — INSULIN ASPART 100 UNIT/ML ~~LOC~~ SOLN
8.0000 [IU] | Freq: Three times a day (TID) | SUBCUTANEOUS | Status: DC
  Administered 2013-10-10 (×2): 8 [IU] via SUBCUTANEOUS
  Filled 2013-10-09: qty 1

## 2013-10-09 MED ORDER — LORAZEPAM 1 MG PO TABS
1.0000 mg | ORAL_TABLET | Freq: Three times a day (TID) | ORAL | Status: DC | PRN
  Administered 2013-10-09 – 2013-10-10 (×2): 1 mg via ORAL
  Filled 2013-10-09 (×2): qty 1

## 2013-10-09 MED ORDER — INSULIN ASPART 100 UNIT/ML ~~LOC~~ SOLN
0.0000 [IU] | SUBCUTANEOUS | Status: DC
Start: 2013-10-09 — End: 2013-10-09
  Administered 2013-10-09: 11 [IU] via SUBCUTANEOUS
  Administered 2013-10-09: 6 [IU] via SUBCUTANEOUS
  Filled 2013-10-09 (×2): qty 1

## 2013-10-09 MED ORDER — INSULIN ASPART 100 UNIT/ML ~~LOC~~ SOLN
0.0000 [IU] | Freq: Three times a day (TID) | SUBCUTANEOUS | Status: DC
  Administered 2013-10-10: 5 [IU] via SUBCUTANEOUS
  Administered 2013-10-10: 8 [IU] via SUBCUTANEOUS

## 2013-10-09 MED ORDER — ASPIRIN 81 MG PO CHEW
81.0000 mg | CHEWABLE_TABLET | Freq: Every day | ORAL | Status: DC
  Administered 2013-10-10: 81 mg via ORAL
  Filled 2013-10-09: qty 1

## 2013-10-09 MED ORDER — ATORVASTATIN CALCIUM 40 MG PO TABS
40.0000 mg | ORAL_TABLET | Freq: Every day | ORAL | Status: DC
  Administered 2013-10-09: 40 mg via ORAL
  Filled 2013-10-09 (×2): qty 1

## 2013-10-09 NOTE — ED Notes (Signed)
Bed: OA41 Expected date:  Expected time:  Means of arrival:  Comments: [place], hold

## 2013-10-09 NOTE — ED Notes (Signed)
Per [facility], patient CBG under 350 for 24 hours. Room 303-1 held for patient.
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
## 2013-10-09 NOTE — ED Notes (Signed)
PA made aware of CBG, at bedside.
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
## 2013-10-09 NOTE — Telephone Encounter (Signed)
Wanted to inform Maureen Bunch, NP>Was in ED on Sunday, Klonopin originally prescribed by M. Kallie Edward, NP was changed to Xanax in ED. Patient to start Psych IOP 10/10/13

## 2013-10-09 NOTE — BH Assessment (Signed)
Patient accepted to [facility] by Waylan Boga, NP pending medical clearance. Patient's CBG must be stabilization before transferring to [facility]
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
## 2013-10-10 ENCOUNTER — Inpatient Hospital Stay (HOSPITAL_COMMUNITY)
Admission: AD | Admit: 2013-10-10 | Discharge: 2013-10-16 | DRG: 885 | Disposition: A | Discharge status: Home / Self Care | Payer: 59 | Source: Intra-hospital | Attending: Psychiatry | Admitting: Psychiatry

## 2013-10-10 ENCOUNTER — Encounter (HOSPITAL_COMMUNITY): Payer: Self-pay | Admitting: Behavioral Health

## 2013-10-10 DIAGNOSIS — E785 Hyperlipidemia, unspecified: Secondary | ICD-10-CM | POA: Diagnosis present

## 2013-10-10 DIAGNOSIS — Z8249 Family history of ischemic heart disease and other diseases of the circulatory system: Secondary | ICD-10-CM

## 2013-10-10 DIAGNOSIS — E1039 Type 1 diabetes mellitus with other diabetic ophthalmic complication: Secondary | ICD-10-CM | POA: Diagnosis present

## 2013-10-10 DIAGNOSIS — Z807 Family history of other malignant neoplasms of lymphoid, hematopoietic and related tissues: Secondary | ICD-10-CM

## 2013-10-10 DIAGNOSIS — E109 Type 1 diabetes mellitus without complications: Secondary | ICD-10-CM | POA: Diagnosis present

## 2013-10-10 DIAGNOSIS — F411 Generalized anxiety disorder: Secondary | ICD-10-CM | POA: Diagnosis present

## 2013-10-10 DIAGNOSIS — G4700 Insomnia, unspecified: Secondary | ICD-10-CM | POA: Diagnosis present

## 2013-10-10 DIAGNOSIS — F4310 Post-traumatic stress disorder, unspecified: Secondary | ICD-10-CM | POA: Diagnosis present

## 2013-10-10 DIAGNOSIS — R45851 Suicidal ideations: Secondary | ICD-10-CM

## 2013-10-10 DIAGNOSIS — Z5989 Other problems related to housing and economic circumstances: Secondary | ICD-10-CM | POA: Diagnosis not present

## 2013-10-10 DIAGNOSIS — Z5987 Material hardship due to limited financial resources, not elsewhere classified: Secondary | ICD-10-CM

## 2013-10-10 DIAGNOSIS — E1142 Type 2 diabetes mellitus with diabetic polyneuropathy: Secondary | ICD-10-CM | POA: Diagnosis present

## 2013-10-10 DIAGNOSIS — Z598 Other problems related to housing and economic circumstances: Secondary | ICD-10-CM | POA: Diagnosis not present

## 2013-10-10 DIAGNOSIS — F332 Major depressive disorder, recurrent severe without psychotic features: Secondary | ICD-10-CM | POA: Diagnosis not present

## 2013-10-10 DIAGNOSIS — Z23 Encounter for immunization: Secondary | ICD-10-CM | POA: Diagnosis not present

## 2013-10-10 DIAGNOSIS — E1065 Type 1 diabetes mellitus with hyperglycemia: Secondary | ICD-10-CM | POA: Diagnosis present

## 2013-10-10 DIAGNOSIS — F322 Major depressive disorder, single episode, severe without psychotic features: Principal | ICD-10-CM | POA: Diagnosis present

## 2013-10-10 DIAGNOSIS — D72829 Elevated white blood cell count, unspecified: Secondary | ICD-10-CM | POA: Diagnosis present

## 2013-10-10 DIAGNOSIS — IMO0001 Reserved for inherently not codable concepts without codable children: Secondary | ICD-10-CM | POA: Diagnosis present

## 2013-10-10 DIAGNOSIS — D473 Essential (hemorrhagic) thrombocythemia: Secondary | ICD-10-CM | POA: Diagnosis present

## 2013-10-10 DIAGNOSIS — Z833 Family history of diabetes mellitus: Secondary | ICD-10-CM | POA: Diagnosis not present

## 2013-10-10 DIAGNOSIS — Z794 Long term (current) use of insulin: Secondary | ICD-10-CM | POA: Diagnosis not present

## 2013-10-10 DIAGNOSIS — E11319 Type 2 diabetes mellitus with unspecified diabetic retinopathy without macular edema: Secondary | ICD-10-CM | POA: Diagnosis present

## 2013-10-10 DIAGNOSIS — E7800 Pure hypercholesterolemia, unspecified: Secondary | ICD-10-CM | POA: Diagnosis present

## 2013-10-10 DIAGNOSIS — M129 Arthropathy, unspecified: Secondary | ICD-10-CM | POA: Diagnosis present

## 2013-10-10 DIAGNOSIS — E1049 Type 1 diabetes mellitus with other diabetic neurological complication: Secondary | ICD-10-CM | POA: Diagnosis present

## 2013-10-10 HISTORY — DX: Type 1 diabetes mellitus with diabetic polyneuropathy: E10.42

## 2013-10-10 LAB — CBG MONITORING, ED
Glucose-Capillary: 288 mg/dL — ABNORMAL HIGH (ref 70–99)
Glucose-Capillary: 210 mg/dL — ABNORMAL HIGH (ref 70–99)

## 2013-10-10 LAB — GLUCOSE, CAPILLARY
Glucose-Capillary: 130 mg/dL — ABNORMAL HIGH (ref 70–99)
Glucose-Capillary: 40 mg/dL — CL (ref 70–99)
Glucose-Capillary: 43 mg/dL — CL (ref 70–99)
Glucose-Capillary: 48 mg/dL — ABNORMAL LOW (ref 70–99)
Glucose-Capillary: 72 mg/dL (ref 70–99)
Glucose-Capillary: 111 mg/dL — ABNORMAL HIGH (ref 70–99)
Glucose-Capillary: 207 mg/dL — ABNORMAL HIGH (ref 70–99)

## 2013-10-10 MED ORDER — ASPIRIN 81 MG PO CHEW
81.0000 mg | CHEWABLE_TABLET | Freq: Every day | ORAL | Status: DC
  Administered 2013-10-11 – 2013-10-16 (×6): 81 mg via ORAL
  Filled 2013-10-10 (×9): qty 1

## 2013-10-10 MED ORDER — MAGNESIUM HYDROXIDE 400 MG/5ML PO SUSP: 30.0000 mL | Freq: Every day | ORAL | Status: DC | PRN

## 2013-10-10 MED ORDER — INSULIN ASPART 100 UNIT/ML ~~LOC~~ SOLN
8.0000 [IU] | Freq: Three times a day (TID) | SUBCUTANEOUS | Status: DC
  Administered 2013-10-10 – 2013-10-11 (×2): 8 [IU] via SUBCUTANEOUS

## 2013-10-10 MED ORDER — CITALOPRAM HYDROBROMIDE 40 MG PO TABS
40.0000 mg | ORAL_TABLET | Freq: Every day | ORAL | Status: DC
Start: 2013-10-10 — End: 2013-10-12
  Administered 2013-10-11 – 2013-10-12 (×2): 40 mg via ORAL
  Filled 2013-10-10 (×4): qty 1

## 2013-10-10 MED ORDER — DOXEPIN HCL 25 MG PO CAPS
ORAL_CAPSULE | ORAL | Status: AC
  Administered 2013-10-10: 25 mg via ORAL
  Filled 2013-10-10: qty 1

## 2013-10-10 MED ORDER — ALUM & MAG HYDROXIDE-SIMETH 200-200-20 MG/5ML PO SUSP: 30.0000 mL | ORAL | Status: DC | PRN

## 2013-10-10 MED ORDER — INSULIN GLARGINE 100 UNIT/ML ~~LOC~~ SOLN
28.0000 [IU] | Freq: Every day | SUBCUTANEOUS | Status: DC
  Administered 2013-10-10 – 2013-10-11 (×2): 28 [IU] via SUBCUTANEOUS

## 2013-10-10 MED ORDER — DOXEPIN HCL 25 MG PO CAPS: 25.0000 mg | ORAL_CAPSULE | Freq: Every evening | ORAL | Status: DC | PRN

## 2013-10-10 MED ORDER — DOXEPIN HCL 25 MG PO CAPS
25.0000 mg | ORAL_CAPSULE | Freq: Every evening | ORAL | Status: DC | PRN
  Administered 2013-10-10: 25 mg via ORAL
  Filled 2013-10-10 (×3): qty 1

## 2013-10-10 MED ORDER — TRAZODONE HCL 50 MG PO TABS: 50.0000 mg | ORAL_TABLET | Freq: Every evening | ORAL | Status: DC | PRN

## 2013-10-10 MED ORDER — ATORVASTATIN CALCIUM 40 MG PO TABS
40.0000 mg | ORAL_TABLET | Freq: Every day | ORAL | Status: DC
  Administered 2013-10-10 – 2013-10-15 (×6): 40 mg via ORAL
  Filled 2013-10-10 (×9): qty 1

## 2013-10-10 MED ORDER — HYDROXYZINE HCL 25 MG PO TABS
25.0000 mg | ORAL_TABLET | Freq: Four times a day (QID) | ORAL | Status: DC | PRN
  Administered 2013-10-10 – 2013-10-16 (×8): 25 mg via ORAL
  Filled 2013-10-10 (×4): qty 1
  Filled 2013-10-10: qty 8
  Filled 2013-10-10 (×4): qty 1

## 2013-10-10 MED ORDER — ACETAMINOPHEN 325 MG PO TABS: 650.0000 mg | ORAL_TABLET | Freq: Four times a day (QID) | ORAL | Status: DC | PRN
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
## 2013-10-10 NOTE — Progress Notes (Signed)
Recreation Therapy Notes   Animal-Assisted Activity/Therapy (AAA/T) Program Checklist/Progress Notes Patient Eligibility Criteria Checklist & Daily Group note for Rec Tx Intervention  Date: 09.22.2015 Time: 2:45pm Location: [address]    AAA/T Program Assumption of Risk Form signed by Patient/ or Parent Legal Guardian yes  Patient is free of allergies or sever asthma yes  Patient reports no fear of animals yes  Patient reports no history of cruelty to animals yes   Patient understands his/her participation is voluntary yes  Behavioral Response: Did not attend.   Maureen [name], LRT/CTRS  Maureen [name] 10/10/2013 3:26 PM

## 2013-10-10 NOTE — BH Assessment (Signed)
Patient accepted to [facility] by Dr. Lovena Le and Delphia Grates, NP. Attending provider is Dr. Carlton Adam. The room assignment is 303-1. Nursing report # [phone]. Support paperwork completed.
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
## 2013-10-10 NOTE — BHH Group Notes (Signed)
[place] LCSW Group Therapy      Feelings About Diagnosis 1:15 - 2:30 PM         10/10/2013    Type of Therapy:  Group Therapy  Participation Level:  Active  Participation Quality:  Appropriate  Affect:  Depressed  Cognitive:  Alert and Appropriate  Insight:  Developing/Improving and Engaged  Engagement in Therapy:  Developing/Improving and Engaged  Modes of Intervention:  Discussion, Education, Exploration, Problem-Solving, Rapport Building, Support  Summary of Progress/Problems:  Patient actively participated in group. Patient discussed past and present diagnosis and the effects it has had on  life.  Patient talked about family and society being judgmental and the stigma associated with having a mental health diagnosis.  She shared she has accepted her illness and has been able to talk with her teenaged sons about how she is feeling.  Patient shared she continues to have problems with her parents accepting that depression is not a choice.   Maureen [name] 10/10/2013

## 2013-10-10 NOTE — Progress Notes (Signed)
Hypoglycemic Event  CBG: 40 and 48 at 1735 and 1737   Treatment: Pt given ginger ale and accompanied to dinner. After dinner, when CBG was still not WNL, she was given peanut butter and another ginger ale.  Symptoms: Pt asymptomatic.  Follow-up CBG: Time:1756 and 1814 CBG Result:43 and 72, respectively  Possible Reasons for Event: Pt's CBGs have fluctuated. Uncertain.   Comments/MD notified: Dr. Sabra Heck notified. No new orders obtained.     Hector Taft, Lezlie Octave  Remember to initiate Hypoglycemia Order Set & complete
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
## 2013-10-10 NOTE — ED Provider Notes (Signed)
Medical screening examination/treatment/procedure(s) were performed by non-physician practitioner and as supervising physician I was immediately available for consultation/collaboration.   EKG Interpretation None       Charlesetta Shanks, MD 10/10/13 1510

## 2013-10-11 ENCOUNTER — Encounter (HOSPITAL_COMMUNITY): Payer: Self-pay | Admitting: Internal Medicine

## 2013-10-11 DIAGNOSIS — E109 Type 1 diabetes mellitus without complications: Secondary | ICD-10-CM

## 2013-10-11 LAB — GLUCOSE, CAPILLARY
Glucose-Capillary: 219 mg/dL — ABNORMAL HIGH (ref 70–99)
Glucose-Capillary: 221 mg/dL — ABNORMAL HIGH (ref 70–99)
Glucose-Capillary: 315 mg/dL — ABNORMAL HIGH (ref 70–99)
Glucose-Capillary: 319 mg/dL — ABNORMAL HIGH (ref 70–99)
Glucose-Capillary: 141 mg/dL — ABNORMAL HIGH (ref 70–99)
Glucose-Capillary: 91 mg/dL (ref 70–99)
Glucose-Capillary: 202 mg/dL — ABNORMAL HIGH (ref 70–99)

## 2013-10-11 MED ORDER — DOXEPIN HCL 10 MG PO CAPS
10.0000 mg | ORAL_CAPSULE | Freq: Every evening | ORAL | Status: DC | PRN
  Administered 2013-10-11: 10 mg via ORAL
  Filled 2013-10-11 (×6): qty 1

## 2013-10-11 MED ORDER — NICOTINE 21 MG/24HR TD PT24
21.0000 mg | MEDICATED_PATCH | Freq: Every day | TRANSDERMAL | Status: DC
  Filled 2013-10-11 (×2): qty 1

## 2013-10-11 MED ORDER — INSULIN ASPART 100 UNIT/ML ~~LOC~~ SOLN
5.0000 [IU] | Freq: Three times a day (TID) | SUBCUTANEOUS | Status: DC
  Administered 2013-10-11 – 2013-10-12 (×3): 5 [IU] via SUBCUTANEOUS

## 2013-10-11 MED ORDER — INSULIN ASPART 100 UNIT/ML ~~LOC~~ SOLN
0.0000 [IU] | Freq: Three times a day (TID) | SUBCUTANEOUS | Status: DC
  Administered 2013-10-11: 7 [IU] via SUBCUTANEOUS
  Administered 2013-10-12: 2 [IU] via SUBCUTANEOUS
  Administered 2013-10-12 (×2): 3 [IU] via SUBCUTANEOUS
  Administered 2013-10-13 (×2): 2 [IU] via SUBCUTANEOUS
  Administered 2013-10-13: 1 [IU] via SUBCUTANEOUS
  Administered 2013-10-14: 17:00:00 via SUBCUTANEOUS
  Administered 2013-10-15: 3 [IU] via SUBCUTANEOUS
  Administered 2013-10-16: 2 [IU] via SUBCUTANEOUS

## 2013-10-11 MED ORDER — INSULIN ASPART 100 UNIT/ML ~~LOC~~ SOLN
0.0000 [IU] | Freq: Every day | SUBCUTANEOUS | Status: DC
  Administered 2013-10-11 – 2013-10-14 (×3): 2 [IU] via SUBCUTANEOUS

## 2013-10-11 MED ORDER — LORAZEPAM 0.5 MG PO TABS
0.5000 mg | ORAL_TABLET | Freq: Four times a day (QID) | ORAL | Status: DC | PRN
  Administered 2013-10-12 – 2013-10-15 (×4): 0.5 mg via ORAL
  Filled 2013-10-11 (×5): qty 1

## 2013-10-11 NOTE — Progress Notes (Signed)
NUTRITION ASSESSMENT  Pt identified as at risk on the Malnutrition Screen Tool  INTERVENTION: 1. Educated patient on the importance of nutrition and encouraged intake of food and beverages.  NUTRITION DIAGNOSIS: Unintentional weight loss related to sub-optimal intake as evidenced by pt report.   Goal: Pt to meet >/= 90% of their estimated nutrition needs.  Monitor:  PO intake  Assessment:  Patient admitted with major depression and PTSD.  Hx includes Type 1 DM since age 56.  States that she takes Lantus 28 units q HS, Novolog 8 units tid wc and 1 unit per 15 grams Carbohydrates.  UBW 180 lbs in March with weight loss secondary to increased stress.  Patient reports skipping meals.  States that she is now eating well.  "Blood sugar is increased due to stress".    43 y.o. female  Height: Ht Readings from Last 1 Encounters:  10/10/13 5\' 4"  (1.626 m)    Weight: Wt Readings from Last 1 Encounters:  10/10/13 157 lb (71.215 kg)    Weight Hx: Wt Readings from Last 10 Encounters:  10/10/13 157 lb (71.215 kg)  09/15/13 159 lb (72.122 kg)  09/10/13 160 lb 3.2 oz (72.666 kg)  08/15/13 159 lb 11.2 oz (72.439 kg)  08/09/13 166 lb 9.6 oz (75.569 kg)  07/19/13 162 lb 6.4 oz (73.664 kg)  07/18/13 162 lb 12.8 oz (73.846 kg)  07/14/13 170 lb (77.111 kg)  06/13/13 176 lb (79.833 kg)  05/22/13 176 lb 8 oz (80.06 kg)    BMI:  Body mass index is 26.94 kg/(m^2). Pt meets criteria for overweight  based on current BMI.  Estimated Nutritional Needs: Kcal: 25-30 kcal/kg Protein: > 1 gram protein/kg Fluid: 1 ml/kcal  Diet Order: Carb Control Pt is also offered choice of unit snacks mid-morning and mid-afternoon.  Pt is eating as desired.   Lab results and medications reviewed.   Antonieta Iba, RD, LDN Clinical Inpatient Dietitian Pager:  [phone] Weekend and after hours pager:  [phone]

## 2013-10-11 NOTE — [facility] Group Notes (Signed)
[facility]  Emotional Regulation - 1:15 - 2:30 PM  10/11/2013 2:54 PM  Type of Therapy:  Group Therapy  Participation Level:  Did Not Attend- patient in bed.  Concha Pyo 10/11/2013, 2:54 PM

## 2013-10-11 NOTE — Progress Notes (Signed)
Patient ID: Maureen [name], female   DOB: [birth year], 43 y.o.   MRN: [id number] She Has was in bed in AM and up and about this afternoon. She has requested and received prn medication for anxiety today that was effective. Her 5 PM sliding scale insulin held CBG 91. Self inventory: depression 9, hopelessness 9, anxiety 10. Denies SI thoughts .

## 2013-10-11 NOTE — Consult Note (Addendum)
[facility] Medical Consultation  Maureen [name] TGG:[id number] DOB: [birth year] DOA: 10/10/2013 PCP: [name], MD   Requesting physician: Neita Garnet Date of consultation:  10/11/2013 Reason for consultation: Diabetes management.  Labile CBG  Impression/Recommendations Active Problems:   Severe major depression without psychotic features   T1DM, reportedly well controlled with diabetic retinopathy and peripheral neuropathy.  States her CBGs are normally well controlled, but since coming to the hospital, they have been labile.  -  Check A1c -  Continue lantus 28 units QHS -  Recommend checking 2AM CBG to check for nocturnal hypoglycemia -  Decrease to 5 units TID  -  Add low dose SSI with HS insulin -  Will continue to trend CBGs and adjust insulin while inpatient as needed, but she should resume her home insulin regimen at discharge  MDD & PTSD -  Management per psychiatry -  Currently not on any atypical antipsychotics which may increase CBG  Leukocytosis and mild thrombocytosis without symptoms of pneumonia, UTI, or fever. -  Monitor for signs of infection clinically and appropriate screening as indicated -  PCP to repeat CBC as outpatient   I will followup again tomorrow. Please contact me if I can be of assistance in the meanwhile. Thank you for this consultation.  Chief Complaint:  Depression  HPI:   43 year old female with history of MDD and PTSD presented with worsening depression and passive feelings of wanting to die but no active plan to commit suicide or harm herself.  Regarding her DM1, she has had some diabetic retinopathy s/p laser treatment several years ago and mild peripheral neuropathy.  Her A1c has been < 7 recently.  CBG in AM usually in 90s, before lunch around 120, before dinner 150, and before bed 90.  She has been on a stable regimen of 28 units of lantus with 8 units aspart with meals for a long time.  She uses a 15:1 unit carb coverage  on special occasions such as going out to eat.  Yesterday, she did not eat and was hyperglycemic, which she attributed to stress.  She was not in DKA and had taken her lantus insulin the night before.  She was given a total of 16 units of aspart and her CBG fell from the 300-s to the 40s.  She was hungry but otherwise asymptomatic.  Her CBG rebounded and she has been moderately hyperglycemic since then.  She states she normally eats fewer carbohydrates with meals at home and has been indulging some since coming to [facility].  We have been called to manage her diabetes while inpatient.    Review of Systems:   Denies fevers, chills, weight loss or gain, changes to hearing and vision.   Denies sinus congestion, sore throat.   Denies chest pain and palpitations.   Denies SOB, cough.   Denies nausea, vomiting, constipation, diarrhea.   Denies dysuria, frequency, urgency Denies polyuria, polydipsia.   Denies blood in stools, abnormal bruising or bleeding.   Denies lymphadenopathy.   Chronic abdominal pain and arthralgias, myalgias.   Denies skin rash or ulcer.   Denies lower extremity edema.   Denies focal numbness, weakness, slurred speech, confusion, facial droop.   Worsening anxiety and depression.    Past Medical History  Diagnosis Date  . Diabetes mellitus   . Fibromyalgia   . Hyperlipidemia   . Abdominal pain   . Nausea & vomiting   . Biliary dyskinesia   . Depression   . Joint pain   .  Skin rash     due to medications  . Post traumatic stress disorder (PTSD)   . Sleep trouble   . Major depressive disorder   . Cervical strain   . Closed head injury 2010  . Renal stones   . Migraine     hx of none recent  . Arthritis   . Hypoglycemia    Past Surgical History  Procedure Laterality Date  . Lipoma removed  yrs ago  . Knee surgery Left 2012  . Cesarean section  1997, 1999  . Cholecystectomy  02/10/2011    Procedure: LAPAROSCOPIC CHOLECYSTECTOMY WITH INTRAOPERATIVE CHOLANGIOGRAM;   Surgeon: Judieth Keens, DO;  Location: [facility];  Service: General;  Laterality: N/A;  . Robotic assisted laparoscopic lysis of adhesion N/A 03/04/2012    Procedure: ROBOTIC ASSISTED LAPAROSCOPIC LYSIS OF EXTENSIVE ADHESIONS;  Surgeon: Claiborne Billings A. Pamala Hurry, MD;  Location: [facility];  Service: Gynecology;  Laterality: N/A;  . Tonsillectomy  2001  . Abdominal hysterectomy  2001  . Eye surgery  2006    laser eye surgery left eye  . Exploratory laparomty  Mar 04, 2012  . Laparoscopic appendectomy N/A 10/12/2012    Procedure: DIAGNOSTIC APPENDECTOMY LAPAROSCOPIC;  Surgeon: Madilyn Hook, DO;  Location: [facility];  Service: General;  Laterality: N/A;  . Appendectomy     Social History:  reports that she has never smoked. She has never used smokeless tobacco. She reports that she does not drink alcohol or use illicit drugs.  Allergies  Allergen Reactions  . Meloxicam Nausea Only  . Cephalexin Rash    Pt was admitted to the hospital upon taking.  . Ibuprofen Nausea And Vomiting and Rash   Family History  Problem Relation Age of Onset  . Cancer Father     lymphoma  . Nephrolithiasis Father   . Vascular Disease Father   . Cancer Maternal Grandmother     colon  . Hypertension Mother   . Heart disease Mother   . Cataracts Mother   . Diabetes Brother   . Mental illness Maternal Grandfather   . Cancer Maternal Grandfather   . Heart disease Paternal Grandmother   . Heart disease Paternal Grandfather     Prior to Admission medications   Medication Sig Start Date End Date Taking? Authorizing Provider  ALPRAZolam Duanne Moron) 0.5 MG tablet Take 1 tablet (0.5 mg total) by mouth 3 (three) times daily as needed for anxiety. 10/08/13  Yes Shari A Upstill, PA-C  aspirin EC 81 MG tablet Take 81 mg by mouth daily with breakfast.   Yes Historical Provider, MD  atorvastatin (LIPITOR) 40 MG tablet Take 1 tablet (40 mg total) by mouth daily. 05/26/13  Yes Nena Polio, PA-C  citalopram (CELEXA) 40 MG tablet Take 1  tablet (40 mg total) by mouth daily. 09/15/13  Yes Meghan Blankmann, NP  clonazePAM (KLONOPIN) 1 MG tablet Take 1 tablet (1 mg total) by mouth at bedtime. 10/06/13  Yes Meghan Blankmann, NP  insulin aspart (NOVOLOG) 100 UNIT/ML injection Inject 8 Units into the skin 3 (three) times daily with meals. 05/26/13  Yes Nena Polio, PA-C  insulin glargine (LANTUS) 100 UNIT/ML injection Inject 0.28 mLs (28 Units total) into the skin every evening. 05/26/13  Yes Nena Polio, PA-C  oxyCODONE (OXY IR/ROXICODONE) 5 MG immediate release tablet Take 7.5 mg by mouth 4 (four) times daily.   Yes Historical Provider, MD  cyclobenzaprine (FLEXERIL) 5 MG tablet Take 5 mg by mouth 3 (three) times daily as needed for muscle  spasms.  07/12/13   Historical Provider, MD  lidocaine (LIDODERM) 5 % Place 1 patch onto the skin daily as needed (for pain.). Remove & Discard patch within 12 hours or as directed by MD 05/26/13   Nena Polio, PA-C   Physical Exam: Blood pressure 98/55, pulse 86, temperature 98.5 F (36.9 C), temperature source Oral, resp. rate 18, height 5\' 4"  (1.626 m), weight 71.215 kg (157 lb), SpO2 100.00%. Filed Vitals:   10/10/13 1300 10/10/13 1302 10/11/13 0620 10/11/13 0621  BP: 104/58 92/61 110/60 98/55  Pulse: 84 89 79 86  Temp: 98.6 F (37 C)  98.5 F (36.9 C)   TempSrc: Oral  Oral   Resp: 16  18   Height:  5\' 4"  (1.626 m)    Weight:  71.215 kg (157 lb)    SpO2: 100%        General:  BF, NAD  Eyes:  PERRL, anicteric, non-injected.  ENT:  Nares clear.  OP clear, non-erythematous without plaques or exudates.  MMM.  Neck:  Supple without TM or JVD.    Lymph:  No cervical, supraclavicular, or submandibular LAD.  Cardiovascular:  RRR, normal S1, S2, without m/r/g.  2+ pulses, warm extremities  Respiratory:  CTA bilaterally without increased WOB.  Abdomen:  NABS.  Soft, ND/NT.    Skin:  No rashes or focal lesions.  Musculoskeletal:  Normal bulk and tone.  No LE edema.  Psychiatric:  A  & O x 4.  Appropriate affect.  Talked about wanting to live as long as possible despite her T1DM.    Neurologic:   No facial droop, grossly moves all extremities, gait symmetric  Labs on Admission:  Basic Metabolic Panel:  Recent Labs Lab 10/09/13 1110  NA 137  K 4.4  CL 95*  CO2 27  GLUCOSE 398*  BUN 9  CREATININE 0.91  CALCIUM 9.6   Liver Function Tests:  Recent Labs Lab 10/09/13 1110  AST 19  ALT 17  ALKPHOS 132*  BILITOT 0.4  PROT 8.2  ALBUMIN 3.8   No results found for this basename: LIPASE, AMYLASE,  in the last 168 hours No results found for this basename: AMMONIA,  in the last 168 hours CBC:  Recent Labs Lab 10/09/13 1110  WBC 14.7*  HGB 14.6  HCT 43.0  MCV 92.7  PLT 431*   Cardiac Enzymes: No results found for this basename: CKTOTAL, CKMB, CKMBINDEX, TROPONINI,  in the last 168 hours BNP: No components found with this basename: POCBNP,  CBG:  Recent Labs Lab 10/10/13 1942 10/10/13 2303 10/11/13 0422 10/11/13 0558 10/11/13 0955  GLUCAP 111* 207* 219* 221* 315*    Radiological Exams on Admission: No results found.   Time spent: 75 min  Janece Canterbury [facility] Pager [phone]  If 7PM-7AM, please contact night-coverage [email or web address] Password [facility] 10/11/2013, 11:09 AM

## 2013-10-11 NOTE — Progress Notes (Signed)
Inpatient Diabetes Program Recommendations  AACE/ADA: New Consensus Statement on Inpatient Glycemic Control (2013)  Target Ranges:  Prepandial:   less than 140 mg/dL      Peak postprandial:   less than 180 mg/dL (1-2 hours)      Critically ill patients:  140 - 180 mg/dL   Reason for Visit: Diabetes Consult  Diabetes history: DM1 Outpatient Diabetes medications: Lantus 28 units QHS, Novolog 8 units tidwc Current orders for Inpatient glycemic control: same as above  44 year old female, with PMH of Major Depression, as well as PTSD and Type 1 DM, admitted with depression. Sees Dr. Bubba Camp for endo. Pt states HgbA1C was around 6% in the last few months, but states "I know it has probably gone up with all my stress." Had moderate hypoglycemia yesterday with poor po intake and large amount of Novolog.  Blood sugars today in 200-300s.  May need small adjustment to basal insulin. Will watch 1 more day.   Inpatient Diabetes Program Recommendations Insulin - Basal: Lantus 28 units QHS Correction (SSI): Add Novolog sensitive tidwc and hs Insulin - Meal Coverage: Decrease Novolog to 6 units tidwc. If pt eats <50% meal or blood sugar is <80 mg/dL, do not give meal coverage insulin HgbA1C: Need updated HgbA1C to assess glycemic control prior to hospitalization Diet: Encourage pt to make healthy choices in cafeteria using portion control  Note: Will titrate meal coverage insulin daily. If FBS >180 mg/dL, increase Lantus to 32 units QHS. Will follow daily. Thank you. Lorenda Peck, RD, LDN, CDE Inpatient Diabetes Coordinator [phone]

## 2013-10-11 NOTE — ED Provider Notes (Signed)
Medical screening examination/treatment/procedure(s) were performed by non-physician practitioner and as supervising physician I was immediately available for consultation/collaboration.   EKG Interpretation None        Maudry Diego, MD 10/11/13 1547

## 2013-10-11 NOTE — Progress Notes (Signed)
Adult Psychoeducational Group Note  Date:  10/11/2013 Time:  10:00 am  Group Topic/Focus:  Wellness Toolbox:   The focus of this group is to discuss various aspects of wellness, balancing those aspects and exploring ways to increase the ability to experience wellness.  Patients will create a wellness toolbox for use upon discharge.  Participation Level:  Active  Participation Quality:  Appropriate, Sharing and Supportive  Affect:  Appropriate  Cognitive:  Appropriate  Insight: Appropriate  Engagement in Group:  Engaged  Modes of Intervention:  Discussion, Education, Socialization and Support  Additional Comments:  Pt stated that when she is playing with her boys and cooking that she is doing well. Pt stated that when I'm laying in bed all day and crying that she is not doing well. Pt stated that her best friend, oldest son and uncle are all members of her support system.   Wynetta Emery, Eyad Rochford 10/11/2013, 10:36 AM

## 2013-10-11 NOTE — BHH Group Notes (Addendum)
[facility] LCSW Aftercare Discharge Planning Group Note   10/11/2013 9:37 AM    Participation Quality:  Appropraite  Mood/Affect:  Depressed, Tearful  Depression Rating:  8  Anxiety Rating:  8  Thoughts of Suicide:  No  Will you contract for safety?   NA  Current AVH:  No  Plan for Discharge/Comments:  Patient attended discharge planning group and actively participated in group.  Patient advised of having home and outpatient follow up with [facility].  She shared she has a lot of decision to make about her life and thoughts of what she needs to do is increasing her depression.  Patient encouraged to focus on getting better.  CSW provided all participants with daily workbook.   Transportation Means: Patient has transportation.   Supports:  Patient has a support system.   Cameren Earnest, Eulas Post
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
## 2013-10-12 LAB — HEMOGLOBIN A1C
Hgb A1c MFr Bld: 8.5 % — ABNORMAL HIGH (ref <5.7)
Mean Plasma Glucose: 197 mg/dL — ABNORMAL HIGH (ref <117)

## 2013-10-12 LAB — GLUCOSE, CAPILLARY
Glucose-Capillary: 190 mg/dL — ABNORMAL HIGH (ref 70–99)
Glucose-Capillary: 224 mg/dL — ABNORMAL HIGH (ref 70–99)
Glucose-Capillary: 229 mg/dL — ABNORMAL HIGH (ref 70–99)
Glucose-Capillary: 244 mg/dL — ABNORMAL HIGH (ref 70–99)
Glucose-Capillary: 205 mg/dL — ABNORMAL HIGH (ref 70–99)
Glucose-Capillary: 183 mg/dL — ABNORMAL HIGH (ref 70–99)
Glucose-Capillary: 211 mg/dL — ABNORMAL HIGH (ref 70–99)

## 2013-10-12 MED ORDER — INSULIN ASPART 100 UNIT/ML ~~LOC~~ SOLN
6.0000 [IU] | Freq: Three times a day (TID) | SUBCUTANEOUS | Status: DC
  Administered 2013-10-12: 17:00:00 via SUBCUTANEOUS
  Administered 2013-10-12 – 2013-10-13 (×4): 6 [IU] via SUBCUTANEOUS
  Administered 2013-10-14: 17:00:00 via SUBCUTANEOUS
  Administered 2013-10-14: 6 [IU] via SUBCUTANEOUS
  Administered 2013-10-14: 12:00:00 via SUBCUTANEOUS
  Administered 2013-10-15 – 2013-10-16 (×4): 6 [IU] via SUBCUTANEOUS

## 2013-10-12 MED ORDER — ARIPIPRAZOLE 2 MG PO TABS
2.0000 mg | ORAL_TABLET | Freq: Every day | ORAL | Status: DC
  Administered 2013-10-12 – 2013-10-15 (×4): 2 mg via ORAL
  Filled 2013-10-12: qty 4
  Filled 2013-10-12 (×7): qty 1

## 2013-10-12 MED ORDER — FLUOXETINE HCL 20 MG PO CAPS
20.0000 mg | ORAL_CAPSULE | Freq: Every day | ORAL | Status: DC
  Administered 2013-10-13 – 2013-10-16 (×4): 20 mg via ORAL
  Filled 2013-10-12 (×3): qty 1
  Filled 2013-10-12: qty 4
  Filled 2013-10-12 (×2): qty 1

## 2013-10-12 MED ORDER — INSULIN GLARGINE 100 UNIT/ML ~~LOC~~ SOLN
30.0000 [IU] | Freq: Every day | SUBCUTANEOUS | Status: DC
  Administered 2013-10-12 – 2013-10-15 (×4): 30 [IU] via SUBCUTANEOUS

## 2013-10-12 NOTE — Progress Notes (Signed)
Patient ID: Maureen [name], female   DOB: 05/18/1970, 43 y.o.   MRN: [id number] [facility] MD Progress Note  10/12/2013 1:17 PM Maureen [name]  MRN:  [id number] Subjective:   At this time patient feels slightly better but still depressed and focused on her marital issues Objective:  Patient presents with modest improvement in mood and affect. Although she does smile briefly at times, she remains depressed and intermittently tearful. She continues to ruminate about her marital relationship, and states that she is planning on getting an order of protection from him once she discharges from the unit. She was visited by a good friend, and by her children, and this helped improve her mood for a period of time. She is more active in milieu, and has been seen interacting/playing cards with other peers. She is still , however, clearly depressed, and feeling anxious and easily overwhelmed. Of note, as per chart , patient's friend expressed concern that patient may have been misusing BZDs prior to admission. We discussed this with patient and she denies/ minimizes.  She is denying medication side effects, but states she has been on Celexa on and off for years, and feels that at this time ( in spite of current dose of 40 mgrs a day) " it is  Not working any more". She has been on Abilify in the past, and felt it worked but at the time stopped it due to concerns about price. She is wanting to try Abilify again at this time. She has not been on any other antidepressant, as per her report. We discussed options, and she states she would prefer to switch from Celexa to another antidepressant trial in the hopes it will work better for her. Appreciate Hospitalist Consult regarding DM management.    Diagnosis:  MDD, PTSD by history   Total Time spent with patient: 30 minutes    ADL's:  Improving   Sleep: improved   Appetite:  Fair   Suicidal Ideation:  At this time denies any suicidal ideations Homicidal  Ideation:  Denies  AEB (as evidenced by):  Psychiatric Specialty Exam: Physical Exam  Review of Systems  Constitutional: Negative for fever and chills.  Respiratory: Negative for cough and shortness of breath.   Cardiovascular: Negative for chest pain.  Gastrointestinal: Negative for vomiting.  Skin: Negative for rash.  Psychiatric/Behavioral: Positive for depression.    Blood pressure 123/68, pulse 84, temperature 97.9 F (36.6 C), temperature source Oral, resp. rate 18, height 5\' 4"  (1.626 m), weight 71.215 kg (157 lb), SpO2 100.00%.Body mass index is 26.94 kg/(m^2).  General Appearance: improved grooming  Eye Contact::  Good  Speech:  Normal Rate  Volume:  Normal  Mood:  Remains depressed   Affect:  still constricted, tearful at times, but reactive  Thought Process:  Goal Directed and Linear  Orientation:  Full (Time, Place, and Person)  Thought Content:  no hallucinations, no delusions, ruminative about marital stress as above  Suicidal Thoughts:  No- at this time denies any suicidal or homicidal ideations  Homicidal Thoughts:  No  Memory:  recent and remote grossly intact   Judgement:  Fair  Insight:  Fair  Psychomotor Activity:  Normal  Concentration:  Good  Recall:  Good  Fund of Knowledge:Good  Language: Good  Akathisia:  Negative  Handed:  Right  AIMS (if indicated):     Assets:  Desire for Improvement Resilience  Sleep:  Number of Hours: 6.75   Musculoskeletal: Strength & Muscle Tone: within normal  limits Gait & Station: normal Patient leans: N/A  Current Medications: Current Facility-Administered Medications  Medication Dose Route Frequency Provider Last Rate Last Dose  . acetaminophen (TYLENOL) tablet 650 mg  650 mg Oral Q6H PRN Waylan Boga, NP      . alum & mag hydroxide-simeth (MAALOX/MYLANTA) 200-200-20 MG/5ML suspension 30 mL  30 mL Oral Q4H PRN Waylan Boga, NP      . aspirin chewable tablet 81 mg  81 mg Oral Daily Waylan Boga, NP   81 mg at  10/12/13 0802  . atorvastatin (LIPITOR) tablet 40 mg  40 mg Oral q1800 Waylan Boga, NP   40 mg at 10/11/13 1715  . citalopram (CELEXA) tablet 40 mg  40 mg Oral Daily Waylan Boga, NP   40 mg at 10/12/13 0802  . doxepin (SINEQUAN) capsule 10 mg  10 mg Oral QHS,MR X 1 Neita Garnet, MD   10 mg at 10/11/13 2210  . hydrOXYzine (ATARAX/VISTARIL) tablet 25 mg  25 mg Oral Q6H PRN Neita Garnet, MD   25 mg at 10/11/13 1718  . insulin aspart (novoLOG) injection 0-5 Units  0-5 Units Subcutaneous QHS Janece Canterbury, MD   2 Units at 10/11/13 2210  . insulin aspart (novoLOG) injection 0-9 Units  0-9 Units Subcutaneous TID WC Janece Canterbury, MD   3 Units at 10/12/13 1212  . insulin aspart (novoLOG) injection 6 Units  6 Units Subcutaneous TID WC Nishant Dhungel, MD   6 Units at 10/12/13 1210  . insulin glargine (LANTUS) injection 30 Units  30 Units Subcutaneous QHS Nishant Dhungel, MD      . LORazepam (ATIVAN) tablet 0.5 mg  0.5 mg Oral Q6H PRN Neita Garnet, MD      . magnesium hydroxide (MILK OF MAGNESIA) suspension 30 mL  30 mL Oral Daily PRN Waylan Boga, NP        Lab Results:  Results for orders placed during the hospital encounter of 10/10/13 (from the past 48 hour(s))  GLUCOSE, CAPILLARY     Status: Abnormal   Collection Time    10/10/13  1:28 PM      Result Value Ref Range   Glucose-Capillary 130 (*) 70 - 99 mg/dL   Comment 1 Notify RN    GLUCOSE, CAPILLARY     Status: Abnormal   Collection Time    10/10/13  5:35 PM      Result Value Ref Range   Glucose-Capillary 48 (*) 70 - 99 mg/dL  GLUCOSE, CAPILLARY     Status: Abnormal   Collection Time    10/10/13  5:37 PM      Result Value Ref Range   Glucose-Capillary 40 (*) 70 - 99 mg/dL  GLUCOSE, CAPILLARY     Status: Abnormal   Collection Time    10/10/13  5:56 PM      Result Value Ref Range   Glucose-Capillary 43 (*) 70 - 99 mg/dL  GLUCOSE, CAPILLARY     Status: None   Collection Time    10/10/13  6:14 PM      Result Value Ref Range    Glucose-Capillary 72  70 - 99 mg/dL  GLUCOSE, CAPILLARY     Status: Abnormal   Collection Time    10/10/13  7:42 PM      Result Value Ref Range   Glucose-Capillary 111 (*) 70 - 99 mg/dL  GLUCOSE, CAPILLARY     Status: Abnormal   Collection Time    10/10/13 11:03 PM      Result  Value Ref Range   Glucose-Capillary 207 (*) 70 - 99 mg/dL  GLUCOSE, CAPILLARY     Status: Abnormal   Collection Time    10/11/13  4:22 AM      Result Value Ref Range   Glucose-Capillary 219 (*) 70 - 99 mg/dL  GLUCOSE, CAPILLARY     Status: Abnormal   Collection Time    10/11/13  5:58 AM      Result Value Ref Range   Glucose-Capillary 221 (*) 70 - 99 mg/dL   Comment 1 Notify RN    GLUCOSE, CAPILLARY     Status: Abnormal   Collection Time    10/11/13  9:55 AM      Result Value Ref Range   Glucose-Capillary 315 (*) 70 - 99 mg/dL   Comment 1 Notify RN    GLUCOSE, CAPILLARY     Status: Abnormal   Collection Time    10/11/13 11:46 AM      Result Value Ref Range   Glucose-Capillary 319 (*) 70 - 99 mg/dL  GLUCOSE, CAPILLARY     Status: Abnormal   Collection Time    10/11/13  4:08 PM      Result Value Ref Range   Glucose-Capillary 141 (*) 70 - 99 mg/dL  GLUCOSE, CAPILLARY     Status: None   Collection Time    10/11/13  5:05 PM      Result Value Ref Range   Glucose-Capillary 91  70 - 99 mg/dL  HEMOGLOBIN A1C     Status: Abnormal   Collection Time    10/11/13  7:30 PM      Result Value Ref Range   Hemoglobin A1C 8.5 (*) <5.7 %   Comment: (NOTE)                                                                               According to the ADA Clinical Practice Recommendations for 2011, when     HbA1c is used as a screening test:      >=6.5%   Diagnostic of Diabetes Mellitus               (if abnormal result is confirmed)     5.7-6.4%   Increased risk of developing Diabetes Mellitus     References:Diagnosis and Classification of Diabetes Mellitus,Diabetes     YQMV,7846,96(EXBMW 1):S62-S69 and  Standards of Medical Care in             Diabetes - 2011,Diabetes Care,2011,34 (Suppl 1):S11-S61.   Mean Plasma Glucose 197 (*) <117 mg/dL   Comment: Performed at [facility], CAPILLARY     Status: Abnormal   Collection Time    10/11/13  9:22 PM      Result Value Ref Range   Glucose-Capillary 202 (*) 70 - 99 mg/dL  GLUCOSE, CAPILLARY     Status: Abnormal   Collection Time    10/12/13  1:07 AM      Result Value Ref Range   Glucose-Capillary 190 (*) 70 - 99 mg/dL  GLUCOSE, CAPILLARY     Status: Abnormal   Collection Time    10/12/13  5:18 AM      Result Value  Ref Range   Glucose-Capillary 224 (*) 70 - 99 mg/dL  GLUCOSE, CAPILLARY     Status: Abnormal   Collection Time    10/12/13  6:25 AM      Result Value Ref Range   Glucose-Capillary 229 (*) 70 - 99 mg/dL  GLUCOSE, CAPILLARY     Status: Abnormal   Collection Time    10/12/13 11:04 AM      Result Value Ref Range   Glucose-Capillary 244 (*) 70 - 99 mg/dL  GLUCOSE, CAPILLARY     Status: Abnormal   Collection Time    10/12/13 12:05 PM      Result Value Ref Range   Glucose-Capillary 205 (*) 70 - 99 mg/dL    Physical Findings: AIMS: Facial and Oral Movements Muscles of Facial Expression: None, normal Lips and Perioral Area: None, normal Jaw: None, normal Tongue: None, normal,Extremity Movements Upper (arms, wrists, hands, fingers): None, normal Lower (legs, knees, ankles, toes): None, normal, Trunk Movements Neck, shoulders, hips: None, normal, Overall Severity Severity of abnormal movements (highest score from questions above): None, normal Incapacitation due to abnormal movements: None, normal, Dental Status Current problems with teeth and/or dentures?: No Does patient usually wear dentures?: No  CIWA:    COWS:     Assessment: Remains depressed, although range of affect is somewhat improved. She remains ruminative about her husband and their poor relationship at this time. She is not currently  suicidal . She is wanting to try a new antidepressant, based on her concern that Celexa not working in spite of compliance with it, and is also interested in adding Abilify as adjunct.  Treatment Plan Summary: Daily contact with patient to assess and evaluate symptoms and progress in treatment Medication management See below   Plan: 1. Continue inpatient treatment, support, milieu 2. Start PROZAC 20 mgrs QDAY 3. Start ABILIFY 2 mgrs QHS. 3. D/ C  Celexa    Medical Decision Making Problem Points:  Established problem, stable/improving (1), Review of last therapy session (1) and Review of psycho-social stressors (1) Data Points:  Review or order clinical lab tests (1) Review and summation of old records (2) Review of medication regiment & side effects (2)  I certify that inpatient services furnished can reasonably be expected to improve the patient's condition.   COBOS, FERNANDO 10/12/2013, 1:17 PM

## 2013-10-12 NOTE — Progress Notes (Signed)
Patient did attend the evening karaoke group. Pt was engaged, supportive, and participated by singing a song.

## 2013-10-12 NOTE — BHH Suicide Risk Assessment (Signed)
[place] INPATIENT:  Family/Significant Other Suicide Prevention Education  Suicide Prevention Education:  Contact Attempts: Ellwood Dense, Friend, [phone]; has been identified by the patient as the family member/significant other with whom the patient will be residing, and identified as the person(s) who will aid the patient in the event of a mental health crisis.  With written consent from the patient, two attempts were made to provide suicide prevention education, prior to and/or following the patient's discharge.  We were unsuccessful in providing suicide prevention education.  A suicide education pamphlet was given to the patient to share with family/significant other.  Date and time of first attempt:  Left voicemail on October 12, 2013 at 8:40 AM Date and time of second attempt:  Concha Pyo 10/12/2013, 8:44 AM

## 2013-10-12 NOTE — Progress Notes (Signed)
D: Pt presents flat in affect but brightens upon approach. Pt endorses anxiety and depression. Pt hopes to get better so her sons can focus more on themselves. Pt is currently denying any SI/HI with no reports of AVH. Pt actively participated within the milieu.  A: Writer administered scheduled and prn medications to pt. Continued support and availability as needed was extended to this pt. Staff continue to monitor pt with q67min checks.  R: No adverse drug reactions noted. Pt receptive to treatment. Pt remains safe at this time.

## 2013-10-12 NOTE — Clinical Social Work Note (Signed)
CSW spoke with patient's friend who expressed concerns about patient's medications.  She shared patient was given a prescription for Klonopin and had it filled on Friday, 9/18.  She stated when patient went to the ER on Sunday, 9/20, she was given Xanax.  Friend concerned that patient may misuse medications.

## 2013-10-12 NOTE — BHH Group Notes (Signed)
0900 nursing orientation group    The focus of this group is to educate the patient on the purpose and policies of crisis stabilization and provide a format to answer questions about their admission.  The group details unit policies and expectations of patients while admitted.   Pt was an active participant and was appropriate in sharing.

## 2013-10-12 NOTE — Progress Notes (Signed)
[facility] PROGRESS NOTE  Maureen [name] HYW:[id number] DOB: [birth year] DOA: 10/10/2013 PCP: [name], MD  Assessment/Plan: Uncontrolled type 1 DM A1C of 8.5. Medicine consulted or fluctuating fsg. On lantus 28 units daily and aspart premeal 8 nits tid ( reduced to 5u given low fsg yesterday). fsg now elevated. i will increase lantus to 30 units daily and aspart to 6u tid. continue SSI with HS coverage. Check fsg tid with meals and bedtime. Please provide bedtime snack to avoid overnight hypoglycemia. .follows with Dr Chalmers Cater for her DM  Severe depression Denies suicidal ideation but still feels depressed. Plan per primary   i will follow up again tomorrow.      HPI/Subjective: Denies any symptoms. fsg in 200s past 24 hrs  Objective: Filed Vitals:   10/12/13 1121  BP: 123/68  Pulse: 84  Temp:   Resp:    No intake or output data in the 24 hours ending 10/12/13 1346 Filed Weights   10/10/13 1302  Weight: 71.215 kg (157 lb)    Exam:   General:  NAD  HEENT: moist mucosa   chest: clear b/l  CVS: NS1&S2  EXT: warm, no edema  Data Reviewed: Basic Metabolic Panel:  Recent Labs Lab 10/09/13 1110  NA 137  K 4.4  CL 95*  CO2 27  GLUCOSE 398*  BUN 9  CREATININE 0.91  CALCIUM 9.6   Liver Function Tests:  Recent Labs Lab 10/09/13 1110  AST 19  ALT 17  ALKPHOS 132*  BILITOT 0.4  PROT 8.2  ALBUMIN 3.8   No results found for this basename: LIPASE, AMYLASE,  in the last 168 hours No results found for this basename: AMMONIA,  in the last 168 hours CBC:  Recent Labs Lab 10/09/13 1110  WBC 14.7*  HGB 14.6  HCT 43.0  MCV 92.7  PLT 431*   Cardiac Enzymes: No results found for this basename: CKTOTAL, CKMB, CKMBINDEX, TROPONINI,  in the last 168 hours BNP (last 3 results) No results found for this basename: PROBNP,  in the last 8760 hours CBG:  Recent Labs Lab 10/12/13 0107 10/12/13 0518 10/12/13 0625 10/12/13 1104  10/12/13 1205  GLUCAP 190* 224* 229* 244* 205*    No results found for this or any previous visit (from the past 240 hour(s)).   Studies: No results found.  Scheduled Meds: . ARIPiprazole  2 mg Oral QHS  . aspirin  81 mg Oral Daily  . atorvastatin  40 mg Oral q1800  . doxepin  10 mg Oral QHS,MR X 1  . [START ON 10/13/2013] FLUoxetine  20 mg Oral Daily  . insulin aspart  0-5 Units Subcutaneous QHS  . insulin aspart  0-9 Units Subcutaneous TID WC  . insulin aspart  6 Units Subcutaneous TID WC  . insulin glargine  30 Units Subcutaneous QHS   Continuous Infusions:    Time spent: 25 minutes    Lashannon Bresnan, [place]  [facility] Pager [phone]. If 7PM-7AM, please contact night-coverage at [email or web address], password [facility] 10/12/2013, 1:46 PM  LOS: 2 days

## 2013-10-12 NOTE — BHH Suicide Risk Assessment (Signed)
[place] INPATIENT:  Family/Significant Other Suicide Prevention Education  Suicide Prevention Education:  Education Completed; Ellwood Dense, Friend, [phone]; has been identified by the patient as the family member/significant other with whom the patient will be residing, and identified as the person(s) who will aid the patient in the event of a mental health crisis (suicidal ideations/suicide attempt).  With written consent from the patient, the family member/significant other has been provided the following suicide prevention education, prior to the and/or following the discharge of the patient.  The suicide prevention education provided includes the following:  Suicide risk factors  Suicide prevention and interventions  National Suicide Hotline telephone number  [facility] assessment telephone number  [facility] Emergency Assistance [place] and/or Residential Mobile Crisis Unit telephone number  Request made of family/significant other to:  Remove weapons (e.g., guns, rifles, knives), all items previously/currently identified as safety concern.   Friend advised patient does not have access to weapons.    Remove drugs/medications (over-the-counter, prescriptions, illicit drugs), all items previously/currently identified as a safety concern.  The family member/significant other verbalizes understanding of the suicide prevention education information provided.  The family member/significant other agrees to remove the items of safety concern listed above.  Hawken Bielby Hairston 10/12/2013, 10:04 AM

## 2013-10-12 NOTE — BHH Group Notes (Signed)
[place] LCSW Group Therapy  [facility] 1:15 - 2:30 PM  10/12/2013   Type of Therapy:  Group Therapy  Participation Level:  Did not attend group.  Patient is attending groups sporadically.  Concha Pyo 10/12/2013

## 2013-10-12 NOTE — Progress Notes (Signed)
Nursing Shift Note D: pt reports she slept well last night, good appetite, depression #8, anxiety #9. Pt tearful while speaking with RN about goals upon discharge. Denies HI, SI, AVH. Pain rated #9 in abdomen. Daily goal is to work on herself. A: medication compliant. Did attend group. Monitor Q15 minutes for safety. R: continue to monitor and stabilize. Support and encouragement given.

## 2013-10-13 LAB — GLUCOSE, CAPILLARY
Glucose-Capillary: 146 mg/dL — ABNORMAL HIGH (ref 70–99)
Glucose-Capillary: 177 mg/dL — ABNORMAL HIGH (ref 70–99)
Glucose-Capillary: 168 mg/dL — ABNORMAL HIGH (ref 70–99)
Glucose-Capillary: 152 mg/dL — ABNORMAL HIGH (ref 70–99)

## 2013-10-13 NOTE — Progress Notes (Signed)
Maureen [name] is doing a very good job handling her ups and downs. She attends her groups as planned, she asks appropriate questions and  She is actively trying to help herlsef. A She completes her morning assessment and on it she writes  She denies SI and rates her depression, hopelessness and anxiety "8/8/9' and says she wants to " get better". R Safety in place.

## 2013-10-13 NOTE — Progress Notes (Addendum)
  Uncontrolled type 1 diabetes mellitus Fingersticks better controlled after adjusting insulin and pre-meal aspart. Currently on 30 units Lantus daily and pre-meal aspart 6 units 3 times a day. (Home dose is a 28 units of Lantus and 8 units pre-meal aspart 2 times a day) -No hypoglycemic episodes. Vitals stable. -No further recommendations. Please continue current insulin regimen. Medicine consult will sign off. Patient needs to followup with Dr. Chalmers Cater as outpatient.  -Will sign off. Please call for any questions.  Discussed recommendations with Dr. Parke Poisson on the phone

## 2013-10-13 NOTE — Progress Notes (Signed)
Adult Psychoeducational Group Note  Date:  10/13/2013 Time:  11:21 AM  Group Topic/Focus:  Relapse Prevention Planning:   The focus of this group is to define relapse and discuss the need for planning to combat relapse.  Participation Level:  Active  Participation Quality:  Appropriate, Attentive and Sharing  Affect:  Appropriate  Cognitive:  Alert and Appropriate  Insight: Appropriate and Good  Engagement in Group:  Engaged  Modes of Intervention:  Discussion and Education  Additional Comments:  Pt participated in group discussion speaking about her family as her biggest trigger. Pt stated that no matter how positive of news pt tells family, they see negative side or doubt her view. Pt then stated that this makes her begin to doubt herself and for example how she is raising her children. Pt was able to identify that her coping skills are to avoid families phone calls and any visitations to see them. Pt appeared engaged in group and in discussion with other pts.   Joneen Caraway 10/13/2013, 11:21 AM

## 2013-10-13 NOTE — BHH Group Notes (Signed)
[facility] LCSW Aftercare Discharge Planning Group Note   10/13/2013 11:18 AM    Participation Quality:  Appropraite  Mood/Affect: Depressed  Depression Rating:  8  Anxiety Rating:  9  Thoughts of Suicide:  No  Will you contract for safety?   NA  Current AVH:  No  Plan for Discharge/Comments:  Patient attended discharge planning group and actively participated in group.  Patient advised of having a lot of concerns about the future of her family.  She shared she plans to stay with a friend for a while at discharge. She will follow up with [facility].  CSW provided all participants with daily workbook.   Transportation Means: Patient has transportation.   Supports:  Patient has a support system.   Rehanna Oloughlin, [place]

## 2013-10-13 NOTE — Progress Notes (Signed)
D   Pt is pleasant and appropriate   She interacts well with her peers   She expresses frustration over her blood sugars running so high   She said she has been eating as was appropriate for her diabetes and said she has been dealing with diabetes for years   A   Verbal support given   Medications administered and effectiveness monitored   Praised pt for her efforts and discussed how stress can effect blood sugar R   Pt verbalized understanding and she administered her own insulin

## 2013-10-13 NOTE — BHH Group Notes (Signed)
[place] LCSW Group Therapy  Feelings Around Relapse 1:15 -2:30        10/13/2013   Type of Therapy:  Group Therapy  Participation Level:  Appropriate  Participation Quality:  Appropriate  Affect:  Depressed, Tearful  Cognitive:  Attentive Appropriate  Insight:  Developing/Improving  Engagement in Therapy: Developing/Improving  Modes of Intervention:  Discussion Exploration Problem-Solving Supportive  Summary of Progress/Problems:  The topic for today was feelings around relapse.    Patient processed feelings toward relapse and was able to relate to peers. She advised relapsing for her would be allowing her husband back in her life.  She shared that her father and brother have also been a problem for her but because of guilt, she always gives in to her demands  Patient identified coping skills that can be used to prevent a relapse including not engaging in conversation with them.   Concha Pyo 10/13/2013
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
## 2013-10-14 LAB — GLUCOSE, CAPILLARY
Glucose-Capillary: 116 mg/dL — ABNORMAL HIGH (ref 70–99)
Glucose-Capillary: 117 mg/dL — ABNORMAL HIGH (ref 70–99)
Glucose-Capillary: 189 mg/dL — ABNORMAL HIGH (ref 70–99)
Glucose-Capillary: 201 mg/dL — ABNORMAL HIGH (ref 70–99)

## 2013-10-14 MED ORDER — OXYCODONE HCL 5 MG PO TABS
7.5000 mg | ORAL_TABLET | Freq: Three times a day (TID) | ORAL | Status: DC | PRN
  Administered 2013-10-14 – 2013-10-16 (×4): 7.5 mg via ORAL
  Filled 2013-10-14 (×4): qty 2

## 2013-10-14 NOTE — Progress Notes (Signed)
Nursing Shift Note D: pt reports she slept poorly, depression #7, anxiety #7. Denies SI, HI, AVH, pain rated #8 prn administered. A: scheduled medications administered per MD order. Support and encouragement given. Safety maintained. Pt is in a therapeutic environment. Will continue to follow treatment plan, monitor and stabilize.

## 2013-10-14 NOTE — B[facility] Group Notes (Signed)
[facility] Group Notes:  (Clinical Social Work)  10/14/2013     10-11AM  Summary of Progress/Problems:   The main focus of today's process group was to learn how to use a decisional balance exercise to move forward in the Stages of Change, which were described and discussed.  Motivational Interviewing and a worksheet were utilized to help patients explore in depth the perceived benefits and costs of a self-sabotaging behavior, as well as the  benefits and costs of replacing that with a healthy coping mechanism.   The patient expressed that she has made the decision to change her isolation, and that she is in the process of figuring out her Preparation steps.  She likes having things written down, and is excited to have the decision balance tool to assist her.  Type of Therapy:  Group Therapy - Process   Participation Level:  Active  Participation Quality:  Attentive, Sharing and Supportive  Affect:  Blunted  Cognitive:  Alert and Oriented  Insight:  Engaged  Engagement in Therapy:  Engaged  Modes of Intervention:  Education, Motivational Interviewing  Selmer Dominion, LCSW 10/14/2013, 12:06 PM

## 2013-10-14 NOTE — Progress Notes (Signed)
Attended group 

## 2013-10-14 NOTE — BHH Group Notes (Signed)
[facility] Group Notes:  (Nursing/MHT/Case Management/Adjunct)  Date:  10/14/2013  Time:  2:01 PM  Type of Therapy:  Psychoeducational Skills  Participation Level:  Active  Participation Quality:  Appropriate  Affect:  Appropriate  Cognitive:  Appropriate  Insight:  Appropriate  Engagement in Group:  Engaged  Modes of Intervention:  Discussion  Summary of Progress/Problems: Pt did attend self inventory group, pt reported that she was negative SI/HI, no AH/VH noted. Pt rated her depression as a 7, and her helplessness/hopelessness as a 7.     Pt reported concerns about her pain management, pt advised that the doctor will be made aware.   Benancio Deeds Shanta 10/14/2013, 2:01 PM
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
## 2013-10-14 NOTE — [facility] Group Notes (Signed)
[facility] Group Notes:  (Nursing/MHT/Case Management/Adjunct)  Date:  10/14/2013  Time:  2:43 PM  Type of Therapy:  Psychoeducational Skills  Participation Level:  Active  Participation Quality:  Appropriate  Affect:  Appropriate  Cognitive:  Appropriate  Insight:  Appropriate  Engagement in Group:  Engaged  Modes of Intervention:  Discussion  Summary of Progress/Problems: Pt did attend healthy coping skills group.  Benancio Deeds Shanta 10/14/2013, 2:43 PM

## 2013-10-15 DIAGNOSIS — F411 Generalized anxiety disorder: Secondary | ICD-10-CM

## 2013-10-15 LAB — GLUCOSE, CAPILLARY
Glucose-Capillary: 95 mg/dL (ref 70–99)
Glucose-Capillary: 75 mg/dL (ref 70–99)

## 2013-10-15 MED ORDER — ZOLPIDEM TARTRATE 5 MG PO TABS
5.0000 mg | ORAL_TABLET | Freq: Every evening | ORAL | Status: DC | PRN
  Administered 2013-10-15: 5 mg via ORAL
  Filled 2013-10-15: qty 1

## 2013-10-15 NOTE — Plan of Care (Signed)
Problem: Ineffective individual coping Goal: STG: Patient will remain free from self harm Outcome: Progressing Patient has not engaged in any self harm and presently denies SI  Problem: Diagnosis: Increased Risk For Suicide Attempt Goal: STG-Patient Will Attend All Groups On The Unit Outcome: Progressing Patient is attending groups and is vested in her treatment.

## 2013-10-15 NOTE — BHH Group Notes (Signed)
[place] Group Notes:  (Nursing/MHT/Case Management/Adjunct)  Date:  10/15/2013  Time:  5:27 PM  Type of Therapy:  Psychoeducational Skills  Participation Level:  Active  Participation Quality:  Appropriate  Affect:  Appropriate  Cognitive:  Appropriate  Insight:  Appropriate  Engagement in Group:  Engaged  Modes of Intervention:  Discussion  Summary of Progress/Problems: Pt did attend self inventory group.  Maureen [name] Shanta 10/15/2013, 5:27 PM

## 2013-10-15 NOTE — Progress Notes (Signed)
Spoke with patient 1:1 who is slightly anxious but is smiling and pleasant. She interacts appropriately with peers and staff. Rates her pain at a 5/10 but states she does not want any pain meds at this time as she wants to try the prn ambien for sleep. She is concerned she will be oversedated. Explained to patient that pain med will be available if she awakes during the night. Also offered support and encouragement. CBG was a "79" therefore hs coverage held. Pt self admin scheduled lantus dose. Patient states her CBGs are generally more stable at home. Reviewed low carb diet with patient and patient confirms understanding as she states she has been diabetic for 31 years. She denies SI/HI at present and remains safe. Jamie Kato

## 2013-10-15 NOTE — BHH Group Notes (Signed)
[place] Group Notes:  (Nursing/MHT/Case Management/Adjunct)  Date:  10/15/2013  Time:  5:27 PM  Type of Therapy:  Psychoeducational Skills  Participation Level:  Active  Participation Quality:  Appropriate  Affect:  Appropriate  Cognitive:  Appropriate  Insight:  Appropriate  Engagement in Group:  Engaged  Modes of Intervention:  Discussion  Summary of Progress/Problems: Pt did attend healthy coping skills group. Benancio Deeds Shanta 10/15/2013, 5:27 PM

## 2013-10-15 NOTE — BHH Group Notes (Signed)
[place] Group Notes:  (Clinical Social Work)  10/15/2013  10:00-11:00AM  Summary of Progress/Problems:   The main focus of today's process group was to   1)  discuss the importance of adding supports  2)  define health supports versus unhealthy supports  3)  identify the patient's current unhealthy supports and plan how to handle them  4)  Identify the patient's current healthy supports and plan what to add.  An emphasis was placed on using counselor, doctor, therapy groups, 12-step groups, and problem-specific support groups to expand supports.    The patient expressed full comprehension of the concepts presented, and agreed that there is a need to add more supports.  The patient stated her current supports include her best friend and 2 minor son, her pastor, and her therapist who is advising her to go to Intensive Outpatient Program at [facility] at discharge.  Type of Therapy:  Process Group with Motivational Interviewing  Participation Level:  Active  Participation Quality:  Attentive, Sharing and Supportive  Affect:  Blunted  Cognitive:  Alert, Appropriate and Oriented  Insight:  Engaged  Engagement in Therapy:  Engaged  Modes of Intervention:   Education, Support and Processing, Activity  [employer], LCSW 10/15/2013, 12:15pm

## 2013-10-15 NOTE — Progress Notes (Signed)
[facility] MD Progress Note  10/15/2013 3:16 PM Maureen [name]  MRN:  [id number] Subjective:  Maureen [name] states that she shared with her husbands family the need to have an intervention where he is confronted with his change in behavior. They agree to work on it. She states that once she gets out of here she is not going to go back home, states this could be a way of going back to life as usual and she admits she cant do that. States that since her husband got insurance on her and the kids she has been more worried about her safety Diagnosis:   DSM5: Depressive Disorders:  Major Depressive Disorder - Moderate (296.22) Total Time spent with patient: 30 minutes  Axis I: Generalized Anxiety Disorder  ADL's:  Intact  Sleep: Fair  Appetite:  Fair Psychiatric Specialty Exam: Physical Exam  Review of Systems  Constitutional: Negative.   HENT: Negative.   Eyes: Negative.   Respiratory: Negative.   Cardiovascular: Negative.   Gastrointestinal: Negative.   Genitourinary: Negative.   Musculoskeletal: Positive for joint pain and myalgias.  Skin: Negative.   Neurological: Negative.   Endo/Heme/Allergies: Negative.   Psychiatric/Behavioral: Positive for depression. The patient is nervous/anxious.     Blood pressure 119/57, pulse 77, temperature 97.6 F (36.4 C), temperature source Oral, resp. rate 18, height 5\' 4"  (1.626 m), weight 71.215 kg (157 lb), SpO2 100.00%.Body mass index is 26.94 kg/(m^2).  General Appearance: Fairly Groomed  Engineer, water::  Fair  Speech:  Clear and Coherent  Volume:  Normal  Mood:  Anxious and worried  Affect:  anxious, worried  Thought Process:  Coherent and Goal Directed  Orientation:  Full (Time, Place, and Person)  Thought Content:  events worries concerns  Suicidal Thoughts:  No  Homicidal Thoughts:  No  Memory:  Immediate;   Fair Recent;   Fair Remote;   Fair  Judgement:  Fair  Insight:  Present  Psychomotor Activity:  Restlessness  Concentration:  Fair   Recall:  [employer] of Knowledge:NA  Language: Fair  Akathisia:  No  Handed:    AIMS (if indicated):     Assets:  Desire for Improvement Social Support  Sleep:  Number of Hours: 5   Musculoskeletal: Strength & Muscle Tone: within normal limits Gait & Station: normal Patient leans: N/A  Current Medications: Current Facility-Administered Medications  Medication Dose Route Frequency Provider Last Rate Last Dose  . acetaminophen (TYLENOL) tablet 650 mg  650 mg Oral Q6H PRN Waylan Boga, NP      . alum & mag hydroxide-simeth (MAALOX/MYLANTA) 200-200-20 MG/5ML suspension 30 mL  30 mL Oral Q4H PRN Waylan Boga, NP      . ARIPiprazole (ABILIFY) tablet 2 mg  2 mg Oral QHS Neita Garnet, MD   2 mg at 10/14/13 2207  . aspirin chewable tablet 81 mg  81 mg Oral Daily Waylan Boga, NP   81 mg at 10/15/13 0817  . atorvastatin (LIPITOR) tablet 40 mg  40 mg Oral q1800 Waylan Boga, NP   40 mg at 10/14/13 1651  . FLUoxetine (PROZAC) capsule 20 mg  20 mg Oral Daily Neita Garnet, MD   20 mg at 10/15/13 0817  . hydrOXYzine (ATARAX/VISTARIL) tablet 25 mg  25 mg Oral Q6H PRN Neita Garnet, MD   25 mg at 10/15/13 0817  . insulin aspart (novoLOG) injection 0-5 Units  0-5 Units Subcutaneous QHS Janece Canterbury, MD   2 Units at 10/14/13 2206  . insulin aspart (novoLOG) injection  0-9 Units  0-9 Units Subcutaneous TID WC Janece Canterbury, MD      . insulin aspart (novoLOG) injection 6 Units  6 Units Subcutaneous TID WC Nishant Dhungel, MD   6 Units at 10/15/13 2423  . insulin glargine (LANTUS) injection 30 Units  30 Units Subcutaneous QHS Nishant Dhungel, MD   30 Units at 10/14/13 2203  . LORazepam (ATIVAN) tablet 0.5 mg  0.5 mg Oral Q6H PRN Neita Garnet, MD   0.5 mg at 10/15/13 0817  . magnesium hydroxide (MILK OF MAGNESIA) suspension 30 mL  30 mL Oral Daily PRN Waylan Boga, NP      . oxyCODONE (Oxy IR/ROXICODONE) immediate release tablet 7.5 mg  7.5 mg Oral Q8H PRN Nicholaus Bloom, MD   7.5 mg at 10/15/13  5361    Lab Results:  Results for orders placed during the hospital encounter of 10/10/13 (from the past 48 hour(s))  GLUCOSE, CAPILLARY     Status: Abnormal   Collection Time    10/13/13  5:01 PM      Result Value Ref Range   Glucose-Capillary 168 (*) 70 - 99 mg/dL   Comment 1 Documented in Chart     Comment 2 Notify RN    GLUCOSE, CAPILLARY     Status: Abnormal   Collection Time    10/13/13  8:36 PM      Result Value Ref Range   Glucose-Capillary 152 (*) 70 - 99 mg/dL  GLUCOSE, CAPILLARY     Status: Abnormal   Collection Time    10/14/13  6:06 AM      Result Value Ref Range   Glucose-Capillary 116 (*) 70 - 99 mg/dL  GLUCOSE, CAPILLARY     Status: Abnormal   Collection Time    10/14/13 11:44 AM      Result Value Ref Range   Glucose-Capillary 117 (*) 70 - 99 mg/dL  GLUCOSE, CAPILLARY     Status: Abnormal   Collection Time    10/14/13  4:43 PM      Result Value Ref Range   Glucose-Capillary 189 (*) 70 - 99 mg/dL   Comment 1 Documented in Chart     Comment 2 Notify RN    GLUCOSE, CAPILLARY     Status: Abnormal   Collection Time    10/14/13  9:57 PM      Result Value Ref Range   Glucose-Capillary 201 (*) 70 - 99 mg/dL  GLUCOSE, CAPILLARY     Status: None   Collection Time    10/15/13  6:04 AM      Result Value Ref Range   Glucose-Capillary 95  70 - 99 mg/dL   Comment 1 Notify RN     Comment 2 Documented in Chart    GLUCOSE, CAPILLARY     Status: None   Collection Time    10/15/13 12:15 PM      Result Value Ref Range   Glucose-Capillary 75  70 - 99 mg/dL   Comment 1 Documented in Chart     Comment 2 Notify RN      Physical Findings: AIMS: Facial and Oral Movements Muscles of Facial Expression: None, normal Lips and Perioral Area: None, normal Jaw: None, normal Tongue: None, normal,Extremity Movements Upper (arms, wrists, hands, fingers): None, normal Lower (legs, knees, ankles, toes): None, normal, Trunk Movements Neck, shoulders, hips: None, normal, Overall  Severity Severity of abnormal movements (highest score from questions above): None, normal Incapacitation due to abnormal movements: None, normal, Dental Status  Current problems with teeth and/or dentures?: No Does patient usually wear dentures?: No  CIWA:    COWS:     Treatment Plan Summary: Daily contact with patient to assess and evaluate symptoms and progress in treatment Medication management  Plan: Supportive approach/coping skills           Optimize response to psychotropics           CBT;mindufulness  Medical Decision Making Problem Points:  Review of psycho-social stressors (1) Data Points:  Review of medication regiment & side effects (2)  I certify that inpatient services furnished can reasonably be expected to improve the patient's condition.   Zuzanna Maroney A 10/15/2013, 3:16 PM

## 2013-10-15 NOTE — Progress Notes (Signed)
Patient seen interacting with peers on day hall. Cheerful and cooperative. Patient stated that she is happy. "Now I know I have good friends. I spoke with my son and he told me friends brought food for them. That's very thoughtful of them". Patient reports pain at the lower abdomen which she rated 7/10. Received oxycodone. Effective. Due medications given as ordered. Denies SI, AH/VH. Safety maintained. Will continue to monitor patient

## 2013-10-15 NOTE — Progress Notes (Signed)
Patient ID: Maureen [name], female   DOB: [birth year], 43 y.o.   MRN: [id number]   D: Pt has been flat and depressed on the unit, however reported that she was feeling better. Pt reported that she was happy about seeing her children today, and well on her way to a better life. Pt reported her depression as a 4, and her hopelessness as a 4. Pt reported being negative SI/HI, no AH/VH noted. A: 15 min checks continued for patient safety. R: Pt safety maintained.

## 2013-10-15 NOTE — Progress Notes (Signed)
Patient did attend the evening speaker AA meeting.  

## 2013-10-16 ENCOUNTER — Ambulatory Visit (HOSPITAL_COMMUNITY): Payer: Self-pay | Admitting: Psychiatry

## 2013-10-16 LAB — GLUCOSE, CAPILLARY
Glucose-Capillary: 249 mg/dL — ABNORMAL HIGH (ref 70–99)
Glucose-Capillary: 79 mg/dL (ref 70–99)
Glucose-Capillary: 154 mg/dL — ABNORMAL HIGH (ref 70–99)
Glucose-Capillary: 114 mg/dL — ABNORMAL HIGH (ref 70–99)

## 2013-10-16 MED ORDER — HYDROXYZINE HCL 25 MG PO TABS: 25.0000 mg | ORAL_TABLET | Freq: Four times a day (QID) | ORAL | Status: DC | PRN

## 2013-10-16 MED ORDER — INSULIN ASPART 100 UNIT/ML ~~LOC~~ SOLN: 8.0000 [IU] | Freq: Three times a day (TID) | SUBCUTANEOUS | Status: DC

## 2013-10-16 MED ORDER — ATORVASTATIN CALCIUM 40 MG PO TABS: 40.0000 mg | ORAL_TABLET | Freq: Every day | ORAL | Status: DC

## 2013-10-16 MED ORDER — ARIPIPRAZOLE 2 MG PO TABS: 2.0000 mg | ORAL_TABLET | Freq: Every day | ORAL | Status: DC

## 2013-10-16 MED ORDER — FLUOXETINE HCL 20 MG PO CAPS: 20.0000 mg | ORAL_CAPSULE | Freq: Every day | ORAL | Status: DC

## 2013-10-16 MED ORDER — ASPIRIN EC 81 MG PO TBEC
81.0000 mg | DELAYED_RELEASE_TABLET | Freq: Every day | ORAL | Status: DC
Start: 2013-10-16 — End: 2014-10-25

## 2013-10-16 MED ORDER — ZOLPIDEM TARTRATE 5 MG PO TABS: 5.0000 mg | ORAL_TABLET | Freq: Every evening | ORAL | Status: DC | PRN

## 2013-10-16 NOTE — BHH Suicide Risk Assessment (Signed)
Demographic Factors:  43 year old female, married, two children  Total Time spent with patient: 30 minutes  Psychiatric Specialty Exam: Physical Exam  ROS  Blood pressure 114/70, pulse 93, temperature 98.2 F (36.8 C), temperature source Oral, resp. rate 16, height 5\' 4"  (1.626 m), weight 71.215 kg (157 lb), SpO2 100.00%.Body mass index is 26.94 kg/(m^2).  General Appearance: Well Groomed  Engineer, water::  Good  Speech:  Normal Rate  Volume:  Normal  Mood:  normal  Affect:  Appropriate  Thought Process:  Goal Directed and Linear  Orientation:  Full (Time, Place, and Person)  Thought Content:  no hallucinations, no delusions   Suicidal Thoughts:  No- denies any suicidal or homicidal ideations, denies any thoughts of hurting husband or anyone else   Homicidal Thoughts:  No  Memory:  recent and remote is grossly intact   Judgement:  Fair  Insight:  Fair  Psychomotor Activity:  Normal  Concentration:  Good  Recall:  Good  Fund of Knowledge:Good  Language: Good  Akathisia:  Negative  Handed:  Right  AIMS (if indicated):     Assets:  Communication Skills Desire for Improvement Resilience  Sleep:  Number of Hours: 5.75    Musculoskeletal: Strength & Muscle Tone: within normal limits Gait & Station: normal Patient leans: N/A   Mental Status Per Nursing Assessment::   On Admission:  Suicidal ideation indicated by patient  Current Mental Status by Physician: At this time patient is improved, with an improved mood and affect, smiling at times appropriately, no thought disorder, no suicidal or homicidal ideations, no delusions, no psychotic symptoms. Future oriented.   Loss Factors: Marital tension, on disability due to chronic medical illness ( DM)   Historical Factors: History of depression, no history of suicidal attempts.   Risk Reduction Factors:   Responsible for children under 14 years of age, Sense of responsibility to family, Living with another person,  especially a relative, Positive social support and Positive coping skills or problem solving skills  Continued Clinical Symptoms:  At this time mood and affect are improved, and she is not suicidal or homicidal or psychotic  Cognitive Features That Contribute To Risk:  No gross cognitive deficits noted, alert and oriented x 3   Suicide Risk:  Mild:  Suicidal ideation of limited frequency, intensity, duration, and specificity.  There are no identifiable plans, no associated intent, mild dysphoria and related symptoms, good self-control (both objective and subjective assessment), few other risk factors, and identifiable protective factors, including available and accessible social support.  Discharge Diagnoses:   AXIS I:   MDD, without psychotic features AXIS II:  Deferred AXIS III:   Past Medical History  Diagnosis Date  . 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 Fibromyalgia   . Hyperlipidemia   . Abdominal pain   . Nausea & vomiting   . Biliary dyskinesia   . Depression   . Joint pain   . Skin rash     due to medications  . Post traumatic stress disorder (PTSD)   . Sleep trouble   . Major depressive disorder   . Cervical strain   . Closed head injury 2010  . Renal stones   . Migraine     hx of none recent  . Arthritis   . Hypoglycemia    AXIS IV:  disability, unemployment, marital stress AXIS V:  51-60 moderate symptoms  ( 60 upon discharge)   Plan Of Care/Follow-up recommendations:  Activity:  As tolerated Diet:  Diabetic Diet Tests:  NA Other:  See below  Is patient on multiple antipsychotic therapies at discharge:  No   Has Patient had three or more failed trials of antipsychotic monotherapy by history:  No  Recommended Plan for Multiple Antipsychotic Therapies: NA  Patient plans to live with [place], a good friend , and states this represents a safe and supportive setting for her and her children. She states she will go to  couples therapy with her husband if he agrees to it. Plans to start  IOP.  Has a PCP ( Dr. Len Childs) as needed for any medical issue and for diabetes management.  COBOS, FERNANDO 10/16/2013, 9:15 AM

## 2013-10-16 NOTE — Progress Notes (Signed)
Adult Psychoeducational Group Note  Date:  10/16/2013 Time: 8:15pm Group Topic/Focus:  Wrap-Up Group:   The focus of this group is to help patients review their daily goal of treatment and discuss progress on daily workbooks.  Participation Level:  Active  Participation Quality:  Appropriate and Attentive  Affect:  Appropriate  Cognitive:  Alert and Appropriate  Insight: Appropriate  Engagement in Group:  Engaged  Modes of Intervention:  Discussion  Additional Comments:  Pt. Was attentive and appropriate during today's group discussion. Pt shared that she has a supportive family and that her family are will to assist her with her medical needs.   Theodoro Grist D 10/16/2013, 9:41 PM

## 2013-10-16 NOTE — Progress Notes (Signed)
[facility] Adult Case Management Discharge Plan :  Will you be returning to the same living situation after discharge:No.  Patient plans to live with a friend. At discharge, do you have transportation home?:Yes,  Patient to arrange transportation. Do you have the ability to pay for your medications:Yes,  Patient is able to afford medications.  Release of information consent forms completed and in the chart;  Patient's signature needed at discharge.  Patient to Follow up at: Follow-up Information   Follow up with Dr. Arnoldo Lenis -[facility] On 11/02/2013. (Thursday, November 02, 2013 at 8:30 AM)    Contact information:   [address] [place], [place]   [address]  [phone]      Follow up with [facility] On 10/24/2013. (You are scheduled with Jovea on Tuesday, October 24, 2013 at [facility])    Contact information:   [address] [place], [place]   [address]  [phone]      Patient denies SI/HI: Patient no longer endorsing SI/HI or other thoughts of self harm.   Safety Planning and Suicide Prevention discussed: .Reviewed with all patients during discharge planning group   Tayvien Kane, Eulas Post 10/16/2013, 11:28 AM

## 2013-10-16 NOTE — Progress Notes (Signed)
Patient ID: Maureen [name], female   DOB: February 04, 1970, 43 y.o.   MRN: [id number]  Pt. Denies SI/HI and A/V hallucinations. Patient rates her depression and hopelessness at 5/10 for the day. Patient rates her anxiety at 8/10 but objectively looks calm in the milieu throughout the day. Belongings returned to patient at time of discharge. Patient denies any new onset of pain or discomfort. Discharge instructions and medications were reviewed with patient. Patient verbalized understanding of both medications and discharge instructions. Q15 minute safety checks maintained until discharge. No distress noted upon discharge. Security escorted patient to [facility] to retrieve her car. Patient is thankful for [facility] and our care.

## 2013-10-16 NOTE — Progress Notes (Signed)
Patient ID: Maureen [name], female   DOB: Nov 14, 1970, 43 y.o.   MRN: [id number]  Psychiatry Follow Up Note  I have seen patient today and completed discharge suicide assessment risk.  Patient is feeling ready for discharge and presenting with improved mood and affect and no SI or HI.  At her specific request and in her presence, I spoke with her husband on the phone, which she gave me. She states that they have spoken over the phone and have had some degree of conciliation, and that he wanted to speak with MD.She is hoping he would agree to couples' counseling.Marland Kitchen  He corroborated that there has been marital discord, and states that she tends to ruminate and  Focus on marital issues such as infidelity that he thought they had already processed and moved past from. He states that they have been in couples' counseling in the past with limited improvement. He states he is in town only for brief periods of time as he is a long distance Administrator.   Of note, he stated that he had not acted out in any violent or threatening manner and that " I don't know what she's told you but  she is probably exaggerating a lot ".
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
## 2013-10-16 NOTE — BHH Group Notes (Signed)
[facility] LCSW Aftercare Discharge Planning Group Note   10/16/2013 11:27 AM  Participation Quality:  Did not attend.  Patient was meeting with MD.    Concha Pyo

## 2013-10-24 ENCOUNTER — Other Ambulatory Visit (HOSPITAL_COMMUNITY): Payer: 59 | Attending: Psychiatry | Admitting: Psychiatry

## 2013-10-24 ENCOUNTER — Encounter (HOSPITAL_COMMUNITY): Payer: Self-pay

## 2013-10-24 ENCOUNTER — Ambulatory Visit (HOSPITAL_COMMUNITY): Payer: Self-pay | Admitting: Licensed Clinical Social Worker

## 2013-10-24 DIAGNOSIS — Z559 Problems related to education and literacy, unspecified: Secondary | ICD-10-CM | POA: Insufficient documentation

## 2013-10-24 DIAGNOSIS — F4310 Post-traumatic stress disorder, unspecified: Secondary | ICD-10-CM | POA: Diagnosis not present

## 2013-10-24 DIAGNOSIS — F1020 Alcohol dependence, uncomplicated: Secondary | ICD-10-CM | POA: Insufficient documentation

## 2013-10-24 DIAGNOSIS — E780 Pure hypercholesterolemia: Secondary | ICD-10-CM | POA: Insufficient documentation

## 2013-10-24 DIAGNOSIS — E1142 Type 2 diabetes mellitus with diabetic polyneuropathy: Secondary | ICD-10-CM | POA: Diagnosis not present

## 2013-10-24 DIAGNOSIS — F331 Major depressive disorder, recurrent, moderate: Secondary | ICD-10-CM

## 2013-10-24 DIAGNOSIS — E785 Hyperlipidemia, unspecified: Secondary | ICD-10-CM | POA: Insufficient documentation

## 2013-10-24 DIAGNOSIS — F332 Major depressive disorder, recurrent severe without psychotic features: Secondary | ICD-10-CM | POA: Insufficient documentation

## 2013-10-24 DIAGNOSIS — Z609 Problem related to social environment, unspecified: Secondary | ICD-10-CM | POA: Insufficient documentation

## 2013-10-24 DIAGNOSIS — M797 Fibromyalgia: Secondary | ICD-10-CM | POA: Insufficient documentation

## 2013-10-24 DIAGNOSIS — F411 Generalized anxiety disorder: Secondary | ICD-10-CM | POA: Diagnosis not present
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
## 2013-10-25 ENCOUNTER — Other Ambulatory Visit (HOSPITAL_COMMUNITY): Payer: 59 | Admitting: Psychiatry

## 2013-10-25 DIAGNOSIS — F331 Major depressive disorder, recurrent, moderate: Secondary | ICD-10-CM

## 2013-10-25 DIAGNOSIS — F332 Major depressive disorder, recurrent severe without psychotic features: Secondary | ICD-10-CM | POA: Diagnosis not present

## 2013-10-25 MED ORDER — CLONAZEPAM 0.5 MG PO TABS: 0.5000 mg | ORAL_TABLET | Freq: Two times a day (BID) | ORAL | Status: DC

## 2013-10-25 NOTE — Progress Notes (Signed)
Daily Group Progress Note  Program: IOP  Group Time: 9:00-10:30  Participation Level: Active  Behavioral Response: Appropriate  Type of Therapy:  Group Therapy  Summary of Progress: Pt. Met with psychiatrist and case manager.      Group Time: 10:30-12:00  Participation Level:  Active  Behavioral Response: Appropriate  Type of Therapy: Psycho-education Group  Summary of Progress: Pt. Met with psychiatrist and case manager.   Nancie Neas, COUNS
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
## 2013-10-26 ENCOUNTER — Other Ambulatory Visit (HOSPITAL_COMMUNITY): Payer: 59 | Admitting: Psychiatry

## 2013-10-26 DIAGNOSIS — F331 Major depressive disorder, recurrent, moderate: Secondary | ICD-10-CM

## 2013-10-26 DIAGNOSIS — F332 Major depressive disorder, recurrent severe without psychotic features: Secondary | ICD-10-CM | POA: Diagnosis not present

## 2013-10-26 NOTE — Progress Notes (Signed)
    Daily Group Progress Note  Program: IOP  Group Time: 9:00-10:30  Participation Level: Active  Behavioral Response: Appropriate  Type of Therapy:  Group Therapy  Summary of Progress: Pt. Shared experience of setting healthier boundaries with her brother, developing acceptance of herself, and grieving the loss of her marriage.      Group Time: 10:30-12:00  Participation Level:  Active  Behavioral Response: Appropriate  Type of Therapy: Psycho-education Group  Summary of Progress: Pt. Watched and discussed Ruby Wax video about the stigma associated with mental illness.   Nancie Neas, COUNS

## 2013-10-26 NOTE — Progress Notes (Signed)
    Daily Group Progress Note  Program: IOP  Group Time: 9:00-10:30  Participation Level: Active  Behavioral Response: Appropriate  Type of Therapy:  Group Therapy  Summary of Progress: Pt.'s first day in group, talked and smiled appropriately, sometimes tearful. Pt. Reported that she has been getting 2-3 hours of sleep a night for several months. Pt. Was open to feedback about developing sleep behaviors to help with insomnia.     Group Time: 10:30-12:00  Participation Level:  Active  Behavioral Response: Appropriate  Type of Therapy: Psycho-education Group  Summary of Progress: Pt. Participated in discussion about being present and being versus doing.   Nancie Neas, COUNS

## 2013-10-27 ENCOUNTER — Other Ambulatory Visit (HOSPITAL_COMMUNITY): Payer: 59 | Admitting: Psychiatry

## 2013-10-27 DIAGNOSIS — F332 Major depressive disorder, recurrent severe without psychotic features: Secondary | ICD-10-CM | POA: Diagnosis not present

## 2013-10-27 DIAGNOSIS — F331 Major depressive disorder, recurrent, moderate: Secondary | ICD-10-CM

## 2013-10-27 IMAGING — CR DG CERVICAL SPINE COMPLETE 4+V
6 series · 6 of 6 positions shown · non-contrast
Comparison: MRI of the cervical spine 10/17/2009.

CLINICAL DATA: Right arm pain and weakness.

CERVICAL SPINE - COMPLETE 4+ VIEW

[view not recorded (1 of 6)]
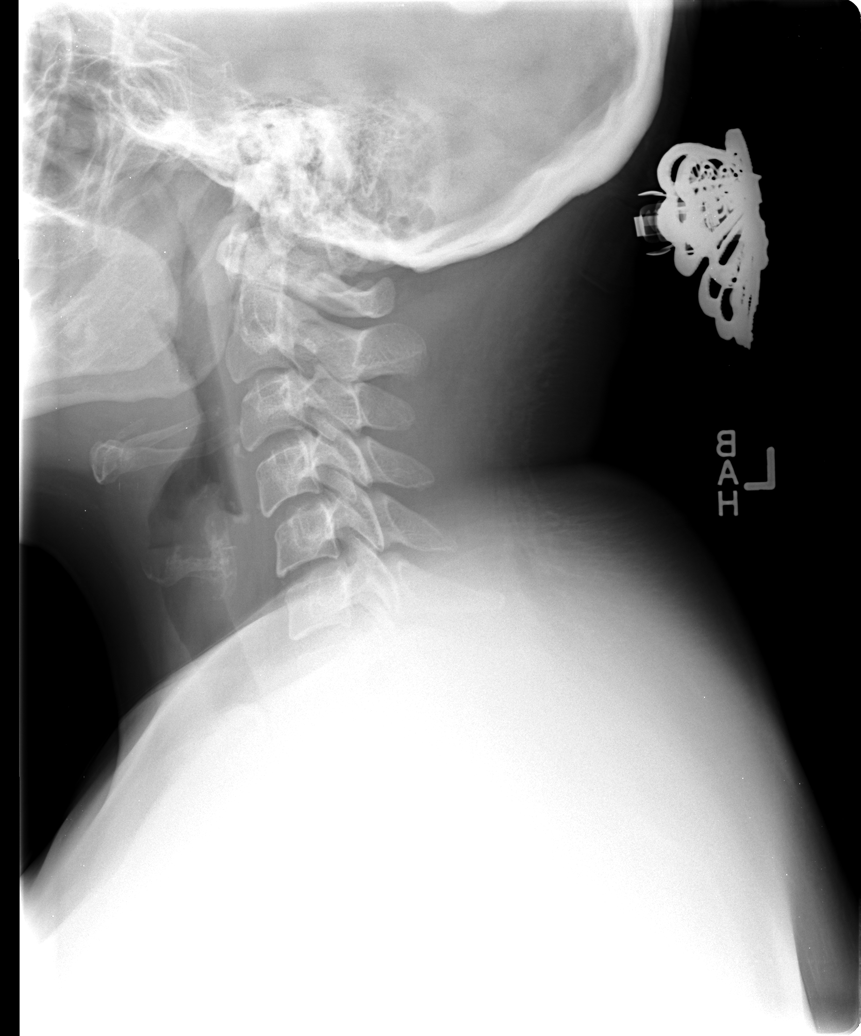

[view not recorded (2 of 6)]
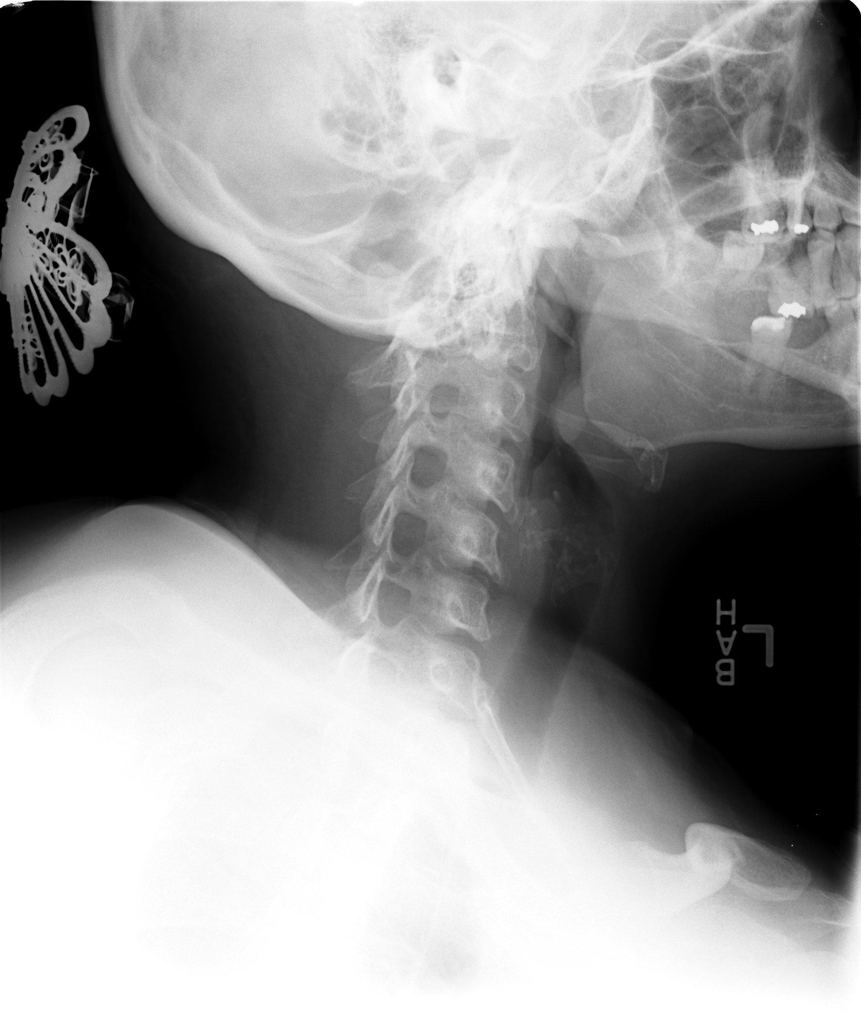

[view not recorded (3 of 6)]
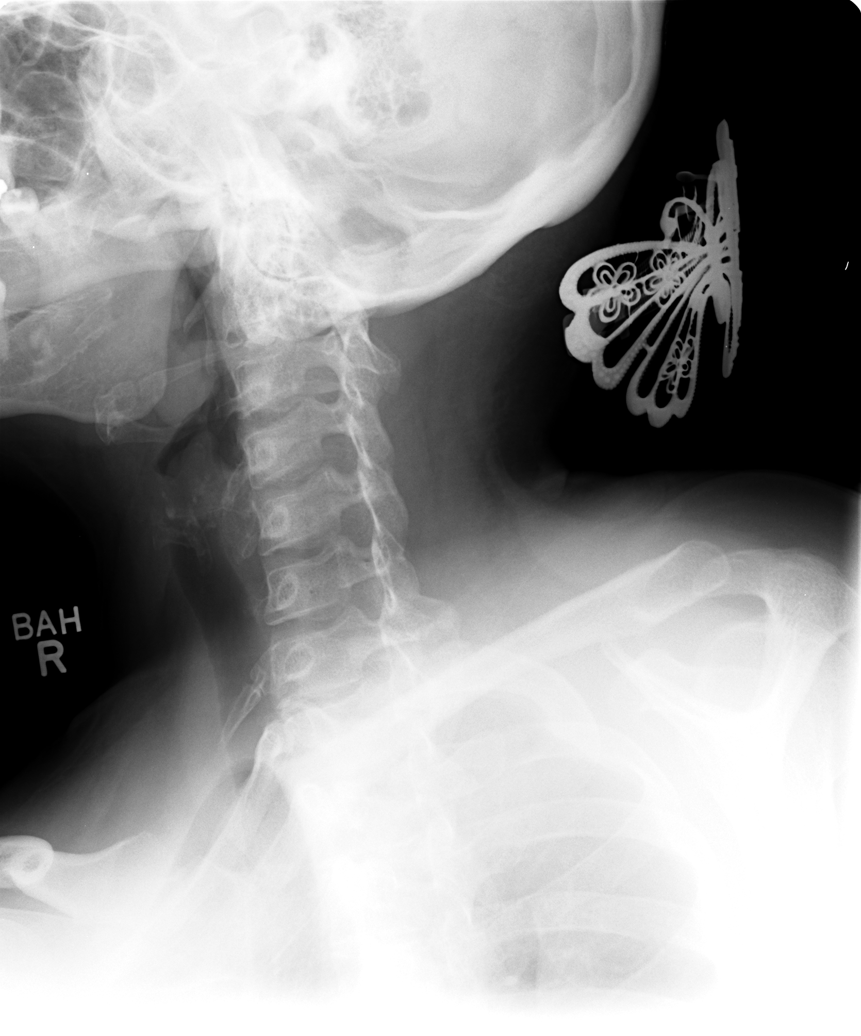

[view not recorded (4 of 6)]
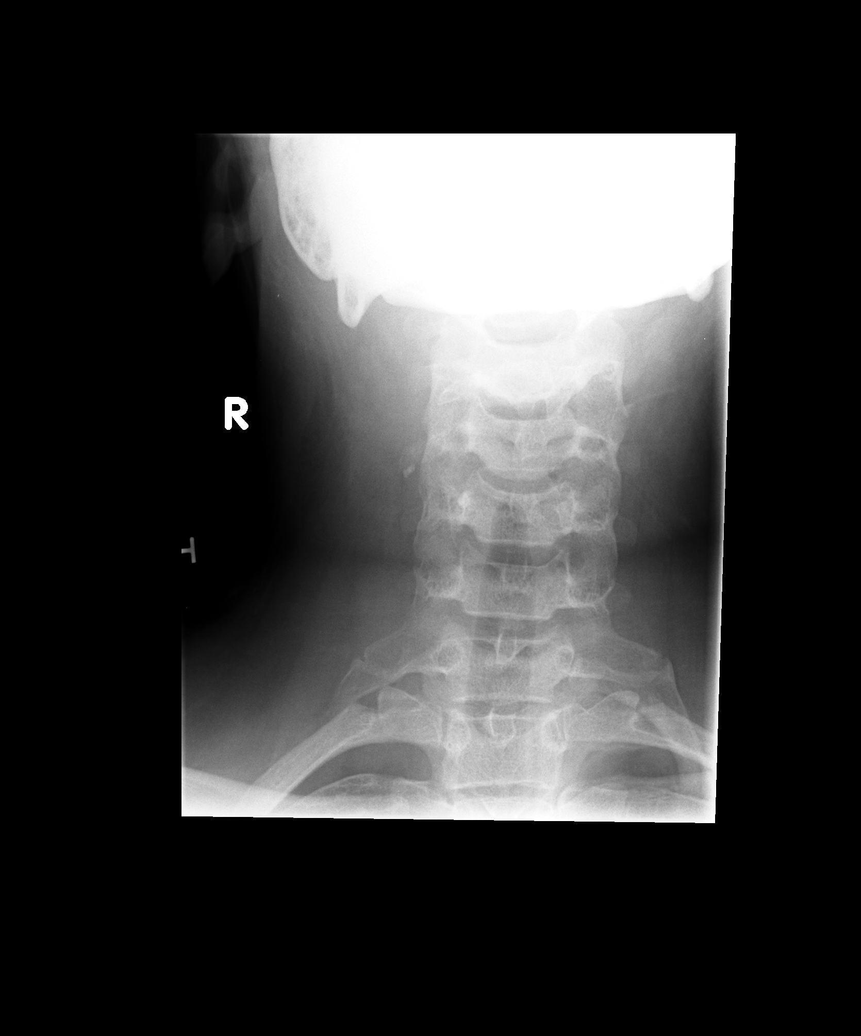

[view not recorded (5 of 6)]
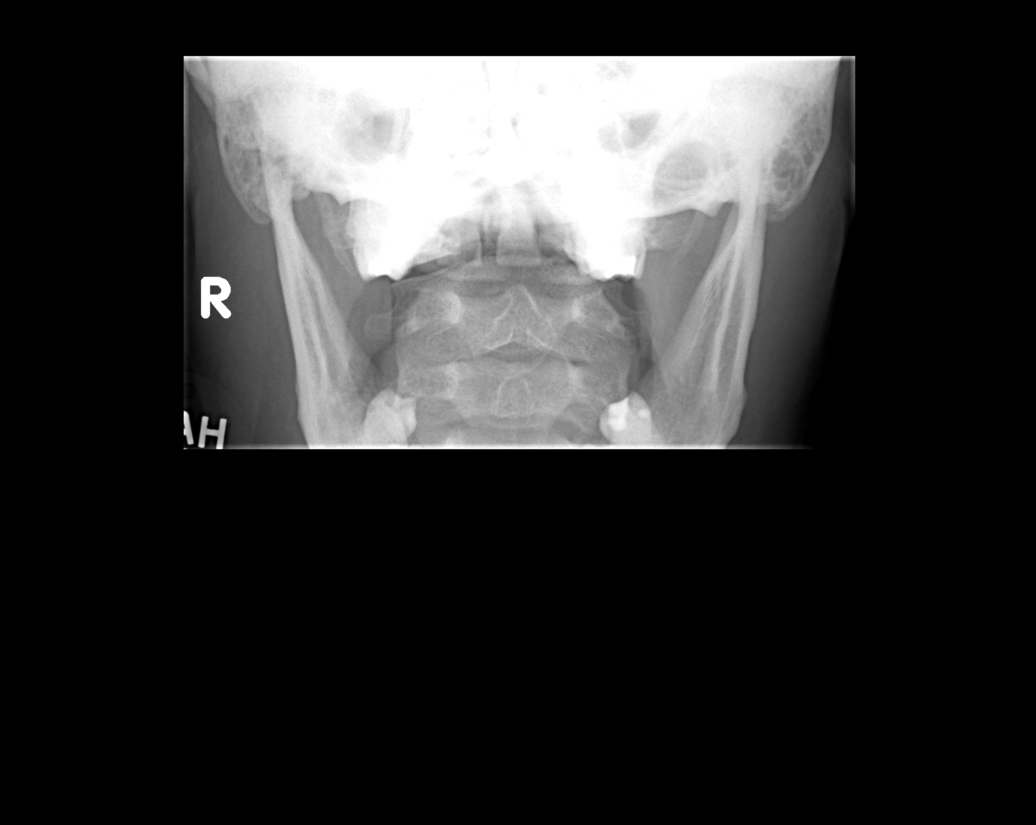

[view not recorded (6 of 6)]
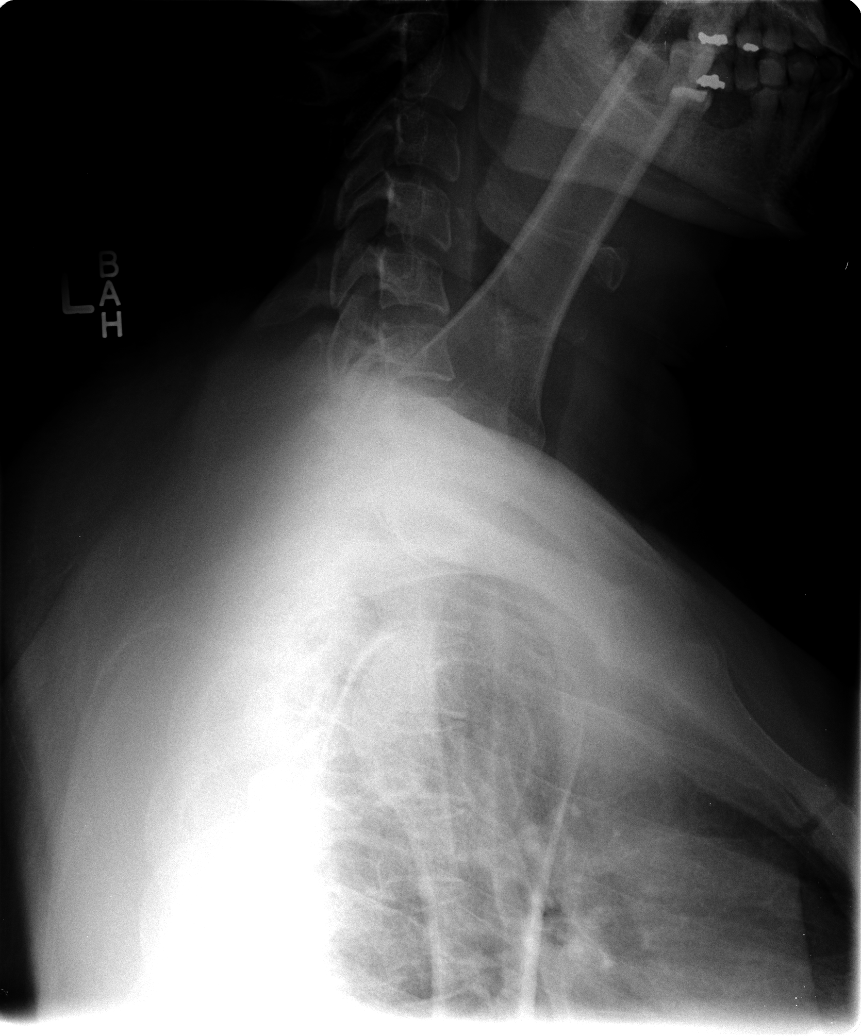

[6 of 6 positions shown; findings below may reference images not displayed]

FINDINGS: The cervical spine is visualized from skull base through
the cervicothoracic junction.  The prevertebral soft tissues are
normal.  Vertebral body heights and alignment are normal.
Straightening of the normal cervical lordosis is similar to the
prior exam.  The foramina are patent bilaterally.  Bilateral
cervical ribs are noted at C7.  The lung apices are clear.
IMPRESSION: 1.  The no acute abnormality.
2.  Bilateral cervical ribs.

## 2013-10-27 NOTE — Progress Notes (Signed)
    Daily Group Progress Note  Program: IOP  Group Time: 6962-9528  Participation Level: Active  Behavioral Response: Appropriate  Type of Therapy:  Group Therapy  Summary of Progress: Pt voiced that she felt so much better today.  Shared that sense she was able to set boundaries with her brother; she feels great.  "I feel like a weight has been lifted."     Group Time: 1045-1200  Participation Level:  Active  Behavioral Response: Appropriate  Type of Therapy: Psycho-education Group  Summary of Progress: Watch short video on Dr. Marilynn Latino presenting self-compassion.  She defined what it is and the three elements of it.  Discussed self compassion afterwards, before role playing three scenarios re:  Self-compassion.

## 2013-10-29 ENCOUNTER — Other Ambulatory Visit (HOSPITAL_COMMUNITY): Payer: Self-pay | Admitting: Psychiatry

## 2013-10-30 ENCOUNTER — Emergency Department (HOSPITAL_COMMUNITY)
Admission: EM | Admit: 2013-10-30 | Discharge: 2013-10-31 | Disposition: A | Discharge status: Home / Self Care | Payer: 59 | Attending: Emergency Medicine | Admitting: Emergency Medicine

## 2013-10-30 ENCOUNTER — Other Ambulatory Visit (HOSPITAL_COMMUNITY): Payer: 59 | Admitting: Psychiatry

## 2013-10-30 ENCOUNTER — Emergency Department (HOSPITAL_COMMUNITY): Payer: 59

## 2013-10-30 ENCOUNTER — Encounter (HOSPITAL_COMMUNITY): Payer: Self-pay | Admitting: Emergency Medicine

## 2013-10-30 DIAGNOSIS — R739 Hyperglycemia, unspecified: Secondary | ICD-10-CM | POA: Diagnosis present

## 2013-10-30 DIAGNOSIS — F329 Major depressive disorder, single episode, unspecified: Secondary | ICD-10-CM | POA: Insufficient documentation

## 2013-10-30 DIAGNOSIS — Z794 Long term (current) use of insulin: Secondary | ICD-10-CM | POA: Insufficient documentation

## 2013-10-30 DIAGNOSIS — Z87828 Personal history of other (healed) physical injury and trauma: Secondary | ICD-10-CM | POA: Insufficient documentation

## 2013-10-30 DIAGNOSIS — R51 Headache: Secondary | ICD-10-CM | POA: Insufficient documentation

## 2013-10-30 DIAGNOSIS — Z8719 Personal history of other diseases of the digestive system: Secondary | ICD-10-CM | POA: Diagnosis not present

## 2013-10-30 DIAGNOSIS — Z79899 Other long term (current) drug therapy: Secondary | ICD-10-CM | POA: Diagnosis not present

## 2013-10-30 DIAGNOSIS — Z8679 Personal history of other diseases of the circulatory system: Secondary | ICD-10-CM | POA: Insufficient documentation

## 2013-10-30 DIAGNOSIS — F419 Anxiety disorder, unspecified: Secondary | ICD-10-CM

## 2013-10-30 DIAGNOSIS — E785 Hyperlipidemia, unspecified: Secondary | ICD-10-CM | POA: Insufficient documentation

## 2013-10-30 DIAGNOSIS — F331 Major depressive disorder, recurrent, moderate: Secondary | ICD-10-CM

## 2013-10-30 DIAGNOSIS — Z7982 Long term (current) use of aspirin: Secondary | ICD-10-CM | POA: Diagnosis not present

## 2013-10-30 DIAGNOSIS — Z8669 Personal history of other diseases of the nervous system and sense organs: Secondary | ICD-10-CM | POA: Insufficient documentation

## 2013-10-30 DIAGNOSIS — E1065 Type 1 diabetes mellitus with hyperglycemia: Secondary | ICD-10-CM | POA: Insufficient documentation

## 2013-10-30 DIAGNOSIS — F332 Major depressive disorder, recurrent severe without psychotic features: Secondary | ICD-10-CM | POA: Diagnosis not present

## 2013-10-30 DIAGNOSIS — Z87448 Personal history of other diseases of urinary system: Secondary | ICD-10-CM | POA: Diagnosis not present

## 2013-10-30 DIAGNOSIS — M1990 Unspecified osteoarthritis, unspecified site: Secondary | ICD-10-CM | POA: Diagnosis not present

## 2013-10-30 DIAGNOSIS — F4310 Post-traumatic stress disorder, unspecified: Secondary | ICD-10-CM

## 2013-10-30 DIAGNOSIS — F32A Depression, unspecified: Secondary | ICD-10-CM

## 2013-10-30 DIAGNOSIS — R519 Headache, unspecified: Secondary | ICD-10-CM

## 2013-10-30 LAB — COMPREHENSIVE METABOLIC PANEL
ALT: 17 U/L (ref 0–35)
AST: 15 U/L (ref 0–37)
Albumin: 3.6 g/dL (ref 3.5–5.2)
Alkaline Phosphatase: 118 U/L — ABNORMAL HIGH (ref 39–117)
Anion gap: 13 (ref 5–15)
BUN: 10 mg/dL (ref 6–23)
CO2: 29 mEq/L (ref 19–32)
Calcium: 9.5 mg/dL (ref 8.4–10.5)
Chloride: 96 mEq/L (ref 96–112)
Creatinine, Ser: 0.85 mg/dL (ref 0.50–1.10)
GFR calc Af Amer: >90 mL/min (ref >90)
GFR calc non Af Amer: 84 mL/min — ABNORMAL LOW (ref >90)
Glucose, Bld: 441 mg/dL — ABNORMAL HIGH (ref 70–99)
Potassium: 5.3 mEq/L (ref 3.7–5.3)
Sodium: 138 mEq/L (ref 137–147)
Total Bilirubin: 0.7 mg/dL (ref 0.3–1.2)
Total Protein: 7.9 g/dL (ref 6.0–8.3)

## 2013-10-30 LAB — CBC
HCT: 40.4 % (ref 36.0–46.0)
Hemoglobin: 13.5 g/dL (ref 12.0–15.0)
MCH: 31 pg (ref 26.0–34.0)
MCHC: 33.4 g/dL (ref 30.0–36.0)
MCV: 92.9 fL (ref 78.0–100.0)
Platelets: 365 10*3/uL (ref 150–400)
RBC: 4.35 MIL/uL (ref 3.87–5.11)
RDW: 13.2 % (ref 11.5–15.5)
WBC: 9.1 10*3/uL (ref 4.0–10.5)

## 2013-10-30 LAB — SALICYLATE LEVEL: Salicylate Lvl: <2 mg/dL — ABNORMAL LOW (ref 2.8–20.0)

## 2013-10-30 LAB — RAPID URINE DRUG SCREEN, HOSP PERFORMED
Amphetamines: NOT DETECTED
Barbiturates: NOT DETECTED
Benzodiazepines: NOT DETECTED
Cocaine: NOT DETECTED
Opiates: NOT DETECTED
Tetrahydrocannabinol: NOT DETECTED

## 2013-10-30 LAB — ACETAMINOPHEN LEVEL: Acetaminophen (Tylenol), Serum: <15 ug/mL (ref 10–30)

## 2013-10-30 LAB — ETHANOL: Alcohol, Ethyl (B): <11 mg/dL (ref 0–11)

## 2013-10-30 MED ORDER — METOCLOPRAMIDE HCL 5 MG/ML IJ SOLN
10.0000 mg | Freq: Once | INTRAMUSCULAR | Status: AC
  Administered 2013-10-30: 10 mg via INTRAMUSCULAR
  Filled 2013-10-30: qty 2

## 2013-10-30 MED ORDER — INSULIN ASPART 100 UNIT/ML ~~LOC~~ SOLN
10.0000 [IU] | Freq: Once | SUBCUTANEOUS | Status: AC
  Administered 2013-10-30: 10 [IU] via SUBCUTANEOUS
  Filled 2013-10-30: qty 1

## 2013-10-30 MED ORDER — DIPHENHYDRAMINE HCL 50 MG/ML IJ SOLN
50.0000 mg | Freq: Once | INTRAMUSCULAR | Status: AC
  Administered 2013-10-30: 50 mg via INTRAMUSCULAR
  Filled 2013-10-30: qty 1

## 2013-10-30 NOTE — ED Provider Notes (Signed)
CSN: [id number]     Arrival date & time 10/30/13  1343 History   First MD Initiated Contact with Patient 10/30/13 1651     Chief Complaint  Patient presents with  . Drug Overdose  . Hyperglycemia     (Consider location/radiation/quality/duration/timing/severity/associated sxs/prior Treatment) HPI 43 year old female with history of anxiety depression post traumatic stress disorder diabetes and prior headaches hospitalized for anxiety and depression within the last few weeks now on Prozac and Klonopin states she has been improving via intensive outpatient treatment and was doing pretty well today but then had an argument with her husband and threatened overdose but states that she just a bit out of frustration and did not really want herself did not overdose does not want to die but does want to live for her teenagers, began to have the sudden severe onset headache this afternoon at 1:00 without change in speech or vision without weakness or numbness without incoordination without vomiting and without treatment prior to arrival; patient denies threats to harm herself or others denies hallucinations and states the headache is moderately severe no longer severe. Past Medical History  Diagnosis Date  . 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 Fibromyalgia   . Hyperlipidemia   . Abdominal pain   . Nausea & vomiting   . Biliary dyskinesia   . Depression   . Joint pain   . Skin rash     due to medications  . Post traumatic stress disorder (PTSD)   . Sleep trouble   . Major depressive disorder   . Cervical strain   . Closed head injury 2010  . Renal stones   . Migraine     hx of none recent  . Arthritis   . Hypoglycemia   . Anxiety    Past Surgical History  Procedure Laterality Date  . Lipoma removed  yrs ago  . Knee surgery Left 2012  . Cesarean section  1997, 1999  . Cholecystectomy  02/10/2011    Procedure: LAPAROSCOPIC CHOLECYSTECTOMY WITH  INTRAOPERATIVE CHOLANGIOGRAM;  Surgeon: Judieth Keens, DO;  Location: [facility];  Service: General;  Laterality: N/A;  . Robotic assisted laparoscopic lysis of adhesion N/A 03/04/2012    Procedure: ROBOTIC ASSISTED LAPAROSCOPIC LYSIS OF EXTENSIVE ADHESIONS;  Surgeon: Claiborne Billings A. Pamala Hurry, MD;  Location: [facility];  Service: Gynecology;  Laterality: N/A;  . Tonsillectomy  2001  . Abdominal hysterectomy  2001  . Eye surgery  2006    laser eye surgery left eye  . Exploratory laparomty  Mar 04, 2012  . Laparoscopic appendectomy N/A 10/12/2012    Procedure: DIAGNOSTIC APPENDECTOMY LAPAROSCOPIC;  Surgeon: Madilyn Hook, DO;  Location: [facility];  Service: General;  Laterality: N/A;  . Appendectomy     Family History  Problem Relation Age of Onset  . Cancer Father     lymphoma  . Nephrolithiasis Father   . Vascular Disease Father   . Alcohol abuse Father   . Drug abuse Father   . Cancer Maternal Grandmother     colon  . Hypertension Mother   . Heart disease Mother   . Cataracts Mother   . Depression Mother   . Diabetes Brother   . Drug abuse Brother   . Mental illness Maternal Grandfather   . Cancer Maternal Grandfather   . Heart disease Paternal Grandmother   . Heart disease Paternal Grandfather    History  Substance Use Topics  . Smoking status: Never Smoker   .  Smokeless tobacco: Never Used  . Alcohol Use: No   OB History    No data available     Review of Systems 10 Systems reviewed and are negative for acute change except as noted in the HPI.   Allergies  Meloxicam; Cephalexin; and Ibuprofen  Home Medications   Prior to Admission medications   Medication Sig Start Date End Date Taking? Authorizing Provider  aspirin EC 81 MG tablet Take 1 tablet (81 mg total) by mouth daily with breakfast. 10/16/13  Yes Freda Munro May Agustin, NP  atorvastatin (LIPITOR) 40 MG tablet Take 1 tablet (40 mg total) by mouth daily. Resume home meds for high cholesterol 10/16/13  Yes Freda Munro May Agustin,  NP  clonazePAM (KLONOPIN) 0.5 MG tablet Take 1 tablet (0.5 mg total) by mouth 2 (two) times daily. 10/25/13 10/25/14 Yes Leonides Grills, MD  insulin aspart (NOVOLOG) 100 UNIT/ML injection Inject 8 Units into the skin 3 (three) times daily with meals. 10/16/13  Yes Freda Munro May Agustin, NP  insulin glargine (LANTUS) 100 UNIT/ML injection Inject 30 Units into the skin at bedtime.   Yes Historical Provider, MD  ARIPiprazole (ABILIFY) 5 MG tablet Take 1 tablet (5 mg total) by mouth at bedtime. For mood stabilization 11/02/13   Leonides Grills, MD  chlorpheniramine-HYDROcodone (TUSSIONEX PENNKINETIC ER) 10-8 MG/5ML LQCR Take 5 mLs by mouth every 12 (twelve) hours as needed for cough. 11/10/13   Billy Fischer, MD  FLUoxetine (PROZAC) 20 MG capsule Take 1 capsule (20 mg total) by mouth daily. For depression 11/08/13   Leonides Grills, MD  hydrOXYzine (ATARAX/VISTARIL) 25 MG tablet Take 1 tablet (25 mg total) by mouth every 6 (six) hours as needed (Anxiety or Insomnia). For anxiety. 11/08/13   Leonides Grills, MD  ipratropium (ATROVENT) 0.06 % nasal spray Place 2 sprays into both nostrils 4 (four) times daily. 11/10/13   Billy Fischer, MD  ondansetron (ZOFRAN) 4 MG tablet Take 1 tablet (4 mg total) by mouth every 6 (six) hours. Prn n/v 11/10/13   Billy Fischer, MD   BP 121/75 mmHg  Pulse 65  Temp(Src) 98 F (36.7 C) (Oral)  Resp 14  Ht 5\' 4"  (1.626 m)  Wt 135 lb (61.236 kg)  BMI 23.16 kg/m2  SpO2 94% Physical Exam  Nursing note and vitals reviewed. Constitutional:  Awake, alert, nontoxic appearance with baseline speech for patient.  HENT:  Head: Atraumatic.  Mouth/Throat: No oropharyngeal exudate.  Eyes: EOM are normal. Pupils are equal, round, and reactive to light. Right eye exhibits no discharge. Left eye exhibits no discharge.  Neck: Neck supple.  Cardiovascular: Normal rate and regular rhythm.   No murmur heard. Pulmonary/Chest: Effort normal and breath sounds normal. No  stridor. No respiratory distress. She has no wheezes. She has no rales. She exhibits no tenderness.  Abdominal: Soft. Bowel sounds are normal. She exhibits no mass. There is no tenderness. There is no rebound.  Musculoskeletal: She exhibits no tenderness.  Baseline ROM, moves extremities with no obvious new focal weakness.  Lymphadenopathy:    She has no cervical adenopathy.  Neurological: She is alert.  Awake, alert, cooperative and aware of situation; motor strength bilaterally; sensation normal to light touch bilaterally; peripheral visual fields full to confrontation; no facial asymmetry; tongue midline; major cranial nerves appear intact; no pronator drift, normal finger to nose bilaterally, baseline gait without new ataxia.  Skin: No rash noted.  Psychiatric: She has a normal mood and affect.  Denies suicidal or  homicidal ideation denies hallucinations currently calm pleasant cooperative    ED Course  Procedures (including critical care time) Headache much improved patient continues to deny suicidal or homicidal ideation denies hallucinations remains pleasant cooperative in the emergency department had hyperglycemia without DKA with blood glucose improved in ED. Patient informed of clinical course, understand medical decision-making process, and agree with plan. Labs Review Labs Reviewed  COMPREHENSIVE METABOLIC PANEL - Abnormal; Notable for the following:    Glucose, Bld 441 (*)    Alkaline Phosphatase 118 (*)    GFR calc non Af Amer 84 (*)    All other components within normal limits  SALICYLATE LEVEL - Abnormal; Notable for the following:    Salicylate Lvl <9.4 (*)    All other components within normal limits  CBC  URINE RAPID DRUG SCREEN (HOSP PERFORMED)  ETHANOL  ACETAMINOPHEN LEVEL  CBG MONITORING, ED    Imaging Review No results found. Ct Head Wo Contrast  10/30/2013   CLINICAL DATA:  Headache, hyperglycemia  EXAM: CT HEAD WITHOUT CONTRAST  TECHNIQUE: Contiguous  axial images were obtained from the base of the skull through the vertex without intravenous contrast.  COMPARISON:  None.  FINDINGS: There is no evidence of mass effect, midline shift or extra-axial fluid collections. There is no evidence of a space-occupying lesion or intracranial hemorrhage. There is no evidence of a cortical-based area of acute infarction.  The ventricles and sulci are appropriate for the patient's age. The basal cisterns are patent.  Visualized portions of the orbits are unremarkable. The visualized portions of the paranasal sinuses and mastoid air cells are unremarkable.  The osseous structures are unremarkable.  IMPRESSION: No acute intracranial pathology.   Electronically Signed   By: Kathreen Devoid   On: 10/30/2013 18:32    EKG Interpretation None      MDM   Final diagnoses:  Anxiety  Depression  Acute nonintractable headache, unspecified headache type  Hyperglycemia due to type 1 diabetes mellitus    I doubt any other EMC precluding discharge at this time including, but not necessarily limited to the following:ongoing SI, SAH, DKA.    Babette Relic, MD 11/19/13 2200

## 2013-10-30 NOTE — ED Notes (Signed)
Patient transported to CT 

## 2013-10-30 NOTE — ED Notes (Signed)
Pt is aware that a urine sample is needed.  

## 2013-10-30 NOTE — Discharge Instructions (Signed)
You have signs of possible anxiety and/or depression. This is a very common problem.  Be sure to call your caregiver and arrange for follow-up care as suggested by our staff. RETURN IMMEDIATELY IF DEVELOP threat to harm self or others, suicidal or homicidal thoughts, hallucinations or confusion, unable to be cared for at home or uncontrolled behavior, or other concerns.  You are having a headache. No specific cause was found today for your headache. It may have been a migraine or other cause of headache. Stress, anxiety, fatigue, and depression are common triggers for headaches. Your headache today does not appear to be life-threatening or require hospitalization, but often the exact cause of headaches is not determined in the emergency department. Therefore, follow-up with your doctor is very important to find out what may have caused your headache, and whether or not you need any further diagnostic testing or treatment. Sometimes headaches can appear benign (not harmful), but then more serious symptoms can develop which should prompt an immediate re-evaluation by your doctor or the emergency department. SEEK MEDICAL ATTENTION IF: You develop possible problems with medications prescribed.  The medications don't resolve your headache, if it recurs , or if you have multiple episodes of vomiting or can't take fluids. You have a change from the usual headache. RETURN IMMEDIATELY IF you develop a sudden, severe headache or confusion, become poorly responsive or faint, develop a fever above 100.85F or problem breathing, have a change in speech, vision, swallowing, or understanding, or develop new weakness, numbness, tingling, incoordination, or have a seizure.

## 2013-10-30 NOTE — ED Notes (Addendum)
Per EMS: pt denies SI/HI but states she called husband saying she was going to take 41 baby aspirin but only took 15 for her headache, pt also has hyperglycemia. Pt states headache has now gotten worse.

## 2013-10-30 NOTE — Progress Notes (Signed)
    Daily Group Progress Note  Program: IOP  Group Time: 9:00-10:30  Participation Level: Active  Behavioral Response: Appropriate  Type of Therapy:  Group Therapy  Summary of Progress: Pt. Reported that she felt very tired. Pt. Reported anger and sadness regarding her estranged husband's behavior. Pt. Reported that she had encouraging conversation with her father who acknowledged pain caused by his drug abuse.      Group Time: 10:30-12:00  Participation Level:  Active  Behavioral Response: Appropriate  Type of Therapy: Psycho-education Group  Summary of Progress: Pt. Participated in grief and loss group facilitated by Jeanella Craze.   Nancie Neas, COUNS

## 2013-10-31 ENCOUNTER — Other Ambulatory Visit (HOSPITAL_COMMUNITY): Payer: 59 | Admitting: Psychiatry

## 2013-10-31 DIAGNOSIS — F332 Major depressive disorder, recurrent severe without psychotic features: Secondary | ICD-10-CM | POA: Diagnosis not present

## 2013-10-31 DIAGNOSIS — F331 Major depressive disorder, recurrent, moderate: Secondary | ICD-10-CM

## 2013-10-31 DIAGNOSIS — F4310 Post-traumatic stress disorder, unspecified: Secondary | ICD-10-CM

## 2013-10-31 LAB — CBG MONITORING, ED: Glucose-Capillary: 94 mg/dL (ref 70–99)

## 2013-11-01 ENCOUNTER — Other Ambulatory Visit (HOSPITAL_COMMUNITY): Payer: 59 | Admitting: Psychiatry

## 2013-11-01 DIAGNOSIS — F332 Major depressive disorder, recurrent severe without psychotic features: Secondary | ICD-10-CM | POA: Diagnosis not present

## 2013-11-01 DIAGNOSIS — F331 Major depressive disorder, recurrent, moderate: Secondary | ICD-10-CM

## 2013-11-01 DIAGNOSIS — F4310 Post-traumatic stress disorder, unspecified: Secondary | ICD-10-CM

## 2013-11-01 NOTE — Progress Notes (Signed)
    Daily Group Progress Note  Program: IOP  Group Time: 9:00-10:30  Participation Level: Active  Behavioral Response: Appropriate  Type of Therapy:  Group Therapy  Summary of Progress: Pt. Presents as talkative and intermittently tearful. Pt. Discussed relationship trauma, developing assertiveness. Pt. Continues to process loss of marriage and decisions related to moving forward with her children.      Group Time: 10:30-12:00  Participation Level:  Active  Behavioral Response: Appropriate  Type of Therapy: Psycho-education Group  Summary of Progress: Pt. Participated in discussion about developing healthy relationship boundaries.   Nancie Neas, COUNS

## 2013-11-02 ENCOUNTER — Ambulatory Visit (HOSPITAL_COMMUNITY): Payer: Self-pay | Admitting: Psychiatry

## 2013-11-02 ENCOUNTER — Other Ambulatory Visit (HOSPITAL_COMMUNITY): Payer: 59 | Admitting: Psychiatry

## 2013-11-02 DIAGNOSIS — F332 Major depressive disorder, recurrent severe without psychotic features: Secondary | ICD-10-CM | POA: Diagnosis not present

## 2013-11-02 MED ORDER — ARIPIPRAZOLE 5 MG PO TABS: 5.0000 mg | ORAL_TABLET | Freq: Every day | ORAL | Status: DC

## 2013-11-02 NOTE — Progress Notes (Signed)
    Daily Group Progress Note  Program: IOP  Group Time: 9:00-10:30  Participation Level: Active  Behavioral Response: Appropriate  Type of Therapy:  Group Therapy  Summary of Progress: Pt. Presents as talkative and smiles appropriately. Pt. Reported that yesterday was a good day for her, that she has been able to talk more freely to her children, extended family and neighbors, recognizing tendency to isolate and not assert her needs.      Group Time: 10:30-12:00  Participation Level:  Active  Behavioral Response: Appropriate  Type of Therapy: Psycho-education Group  Summary of Progress: Pt. Participated in discussion about developing healthy self-care routine.   Nancie Neas, COUNS

## 2013-11-02 NOTE — Progress Notes (Signed)
    Daily Group Progress Note  Program: IOP  Group Time: 9:00-10:30  Participation Level: Active  Behavioral Response: Appropriate  Type of Therapy:  Group Therapy  Summary of Progress: Pt. Continues to smile and talk appropriately. Provides and receives appropriate feedback from group members. Pt. Reports that her mood is "good". Pt. Continues to work on developing healthy relationship boundaries.      Group Time: 10:30-12:00  Participation Level:  Active  Behavioral Response: Appropriate  Type of Therapy: Psycho-education Group  Summary of Progress: Pt. Participated in group facilitated by the mental health association.  Nancie Neas, COUNS

## 2013-11-02 NOTE — Progress Notes (Signed)
Patient ID: Maureen [name], female   DOB: 03-10-1970, 43 y.o.   MRN: [id number] Patient was seen today, states over the weekend her husband called 34 claiming that she took 9 pills in an overdose attempt, so the police came and took her to the emergency room at night in the emergency room and was released the next morning. States that she is handling the situation better and has been thinking about becoming strong not crying mood is improving energy is improving still struggles with sleep because of rumination discussed increasing Abilify 5 mg each bedtime patient stated understanding denies suicidal or homicidal ideation no hallucinations or delusions. Continue other medications.

## 2013-11-03 ENCOUNTER — Other Ambulatory Visit (HOSPITAL_COMMUNITY): Payer: 59 | Admitting: Psychiatry

## 2013-11-03 DIAGNOSIS — F332 Major depressive disorder, recurrent severe without psychotic features: Secondary | ICD-10-CM | POA: Diagnosis not present

## 2013-11-03 DIAGNOSIS — F331 Major depressive disorder, recurrent, moderate: Secondary | ICD-10-CM

## 2013-11-03 NOTE — Progress Notes (Signed)
    Daily Group Progress Note  Program: IOP  Group Time: 9:00-10:30  Participation Level: Active  Behavioral Response: Appropriate  Type of Therapy:  Group Therapy  Summary of Progress: Pt. Reporting that she has more energy, developing assertiveness and greater boundaries in family relationships.      Group Time: 10:30-12:00  Participation Level:  Active  Behavioral Response: Appropriate  Type of Therapy: Psycho-education Group  Summary of Progress: Pt. Participated in discussion about developing motivation. Pt. Discussed Mel Robbins video.  Nancie Neas, COUNS

## 2013-11-06 ENCOUNTER — Other Ambulatory Visit (HOSPITAL_COMMUNITY): Payer: 59 | Admitting: Psychiatry

## 2013-11-06 DIAGNOSIS — F331 Major depressive disorder, recurrent, moderate: Secondary | ICD-10-CM

## 2013-11-06 DIAGNOSIS — F332 Major depressive disorder, recurrent severe without psychotic features: Secondary | ICD-10-CM | POA: Diagnosis not present

## 2013-11-06 DIAGNOSIS — F4310 Post-traumatic stress disorder, unspecified: Secondary | ICD-10-CM

## 2013-11-07 ENCOUNTER — Other Ambulatory Visit (HOSPITAL_COMMUNITY): Payer: 59 | Admitting: Psychiatry

## 2013-11-07 DIAGNOSIS — F331 Major depressive disorder, recurrent, moderate: Secondary | ICD-10-CM

## 2013-11-07 DIAGNOSIS — F332 Major depressive disorder, recurrent severe without psychotic features: Secondary | ICD-10-CM | POA: Diagnosis not present

## 2013-11-07 NOTE — Progress Notes (Signed)
    Daily Group Progress Note  Program: IOP  Group Time: 9:00-10:30  Participation Level: Active  Behavioral Response: Appropriate  Type of Therapy:  Group Therapy  Summary of Progress: Pt. Continues to make progress in group. Pt. Laughs and smiles appropriately. Pt. Reports that she is reaching out to friends, cooking more frequently, becoming more active in her children's lives. Pt. Continues to process loss of marriage and accepting moving forward in her life.      Group Time: 10:30-12:00  Participation Level:  Active  Behavioral Response: Appropriate  Type of Therapy: Psycho-education Group  Summary of Progress: Pt. Participated in grief and loss group.   Nancie Neas, LPC

## 2013-11-07 NOTE — Progress Notes (Signed)
    Daily Group Progress Note  Program: IOP  Group Time: 9:00-10:30  Participation Level: Active  Behavioral Response: Appropriate  Type of Therapy:  Group Therapy  Summary of Progress: Pt. Reported that she had a "great" weekend. Pt. Spent time with friends, went to church, and cooked meal for her family.      Group Time: 10:30-12:00  Participation Level:  Active  Behavioral Response: Appropriate  Type of Therapy: Psycho-education Group  Summary of Progress: Pt. Participated in discussion about forgiveness of self. Pt. Participated in heartmath meditation.   Nancie Neas, LPC

## 2013-11-08 ENCOUNTER — Other Ambulatory Visit (HOSPITAL_COMMUNITY): Payer: 59 | Admitting: Psychiatry

## 2013-11-08 DIAGNOSIS — F331 Major depressive disorder, recurrent, moderate: Secondary | ICD-10-CM

## 2013-11-08 DIAGNOSIS — F332 Major depressive disorder, recurrent severe without psychotic features: Secondary | ICD-10-CM | POA: Diagnosis not present

## 2013-11-08 MED ORDER — FLUOXETINE HCL 20 MG PO CAPS: 20.0000 mg | ORAL_CAPSULE | Freq: Every day | ORAL | Status: DC

## 2013-11-08 MED ORDER — HYDROXYZINE HCL 25 MG PO TABS: 25.0000 mg | ORAL_TABLET | Freq: Four times a day (QID) | ORAL | Status: DC | PRN

## 2013-11-08 NOTE — Progress Notes (Signed)
    Daily Group Progress Note  Program: IOP  Group Time: 9:00-10:30  Participation Level: Active  Behavioral Response: Appropriate  Type of Therapy:  Group Therapy  Summary of Progress: Pt. Presented with bright affect, smiled and laughed appropriately. Pt. Prepared for discharge from group today. Pt. Continuing to work on defining self outside of her marriage and as mother of teenage children. Pt. Learning to define her interests and identify strengths.      Group Time: 10:30-12:00  Participation Level:  Active  Behavioral Response: Appropriate  Type of Therapy: Psycho-education Group  Summary of Progress: Pt. Participated in discussion about developing vulnerability and connection with others.   Nancie Neas, LPC

## 2013-11-08 NOTE — Progress Notes (Signed)
Maureen [name] is a 43 y.o. , separated, African American, unemployed female, who was transitioned from the inpatient [facility]) unit. Pt was admitted there from 10-10-13 until 10-16-13. Pt was admitted due to worsening depressive and anxiety symptoms with SI (no plan or intent). Reports that her deterrent would be her sons. Stated she has a prior history of Major Depression, as well as PTSD. One prior psychiatric admit at [facility] (April 2015). Denied any prior suicide attempts or gestures. Has hx of seeing a therapist Butch Penny [place], [place]) and [employer], Daleen Bo, NP for medication mgmt. Symptoms included: Poor broken sleep (3-4 hrs), daily panic attacks (3 times a day), poor appetite (has lost 30 lbs since March 2015), poor concentration, low energy, no motivation, ruminating thoughts, irritable, isolative, and crying spells. Reported symptoms worsened March 2015. Triggers/Stressors: 1) Husband of 28 yrs marriage. Pt found out that he had been having an affair with a married woman. Possibly got her pregnant. This woman unfairly pressed charges against pt for no reason, which led for patient to be briefly arrested. During the mist of all this, patient's oldest son was graduating from high school. Received a full academic scholarship to a college, but didn't attend due to mother's depression. 2) Conflictual relationship with father. History of drugs and is currently an alcoholic. "He blames me for my parents divorce." 3) Multiple medical issues: Type 1 Diabetes, Neuropathy,Migraines and Kidney Disease. Granted disability six yrs ago.  Family Hx: Father (ETOH and hx of drugs), Mother (Depression), Brother (Drugs)  Pt completed MH-IOP today.  Attended all scheduled days.  Reports overall mood is improving.  Denies SI/HI or A/V hallucinations.  Continues to struggle with poor sleep, indecisiveness, and low motivation.  Plans to visit a friend this weekend and to cater a retirement party.  Pt plans to return to  college.  States that estranged husband is contributing financially to the mortgage and the kids.  According to pt, the groups were very supportive and she was able to reflect on her issues without being judged.  A:  D/C today.  F/U with Rosalin Hawking, LCSW on 11-10-13 @ 9am and Dr. Doyne Keel on 11-23-13 @ 11a.m.  Encouraged support groups.  R:  Pt receptive.

## 2013-11-08 NOTE — Patient Instructions (Signed)
Patient completed MH-IOP today.  Will follow up with Jovea Herbin, LCSW on 11-10-13 at 9 a.m and Dr. Doyne Keel on 11-23-13 @ 11 a.m.  Encouraged support groups.

## 2013-11-09 ENCOUNTER — Other Ambulatory Visit (HOSPITAL_COMMUNITY): Payer: 59 | Admitting: Psychiatry

## 2013-11-10 ENCOUNTER — Emergency Department (INDEPENDENT_AMBULATORY_CARE_PROVIDER_SITE_OTHER)
Admission: EM | Admit: 2013-11-10 | Discharge: 2013-11-10 | Disposition: A | Discharge status: Home / Self Care | Payer: 59 | Source: Home / Self Care | Attending: Family Medicine | Admitting: Family Medicine

## 2013-11-10 ENCOUNTER — Encounter (HOSPITAL_COMMUNITY): Payer: Self-pay | Admitting: Emergency Medicine

## 2013-11-10 ENCOUNTER — Other Ambulatory Visit (HOSPITAL_COMMUNITY): Payer: Self-pay | Admitting: Psychiatry

## 2013-11-10 ENCOUNTER — Other Ambulatory Visit (HOSPITAL_COMMUNITY): Payer: 59

## 2013-11-10 ENCOUNTER — Ambulatory Visit (INDEPENDENT_AMBULATORY_CARE_PROVIDER_SITE_OTHER): Payer: 59 | Admitting: Licensed Clinical Social Worker

## 2013-11-10 DIAGNOSIS — F321 Major depressive disorder, single episode, moderate: Secondary | ICD-10-CM

## 2013-11-10 DIAGNOSIS — F332 Major depressive disorder, recurrent severe without psychotic features: Secondary | ICD-10-CM | POA: Diagnosis not present

## 2013-11-10 DIAGNOSIS — B349 Viral infection, unspecified: Secondary | ICD-10-CM

## 2013-11-10 MED ORDER — ONDANSETRON 4 MG PO TBDP
ORAL_TABLET | ORAL | Status: AC
  Filled 2013-11-10: qty 1

## 2013-11-10 MED ORDER — ONDANSETRON 4 MG PO TBDP
4.0000 mg | ORAL_TABLET | Freq: Once | ORAL | Status: AC
  Administered 2013-11-10: 4 mg via ORAL

## 2013-11-10 MED ORDER — HYDROCOD POLST-CHLORPHEN POLST 10-8 MG/5ML PO LQCR: 5.0000 mL | Freq: Two times a day (BID) | ORAL | Status: DC | PRN

## 2013-11-10 MED ORDER — IPRATROPIUM BROMIDE 0.06 % NA SOLN
2.0000 | Freq: Four times a day (QID) | NASAL | Status: DC
Start: 2013-11-10 — End: 2014-06-20

## 2013-11-10 MED ORDER — ONDANSETRON HCL 4 MG PO TABS: 4.0000 mg | ORAL_TABLET | Freq: Four times a day (QID) | ORAL | Status: DC

## 2013-11-10 NOTE — ED Notes (Signed)
C/o cold sx onset 3 days Sx include: ST, productive cough, nauseas, diarrhea, chills Denies fevers, vomiting  Alert, no signs of acute distress.

## 2013-11-10 NOTE — Discharge Instructions (Signed)
Clear liquid , bland diet tonight as tolerated, advance on sat as improved, use medicine as needed, return or see your doctor if any problems.

## 2013-11-10 NOTE — ED Provider Notes (Signed)
CSN: [id number]     Arrival date & time 11/10/13  1252 History   First MD Initiated Contact with Patient 11/10/13 1333     Chief Complaint  Patient presents with  . URI   (Consider location/radiation/quality/duration/timing/severity/associated sxs/prior Treatment) Patient is a 43 y.o. female presenting with URI. The history is provided by the patient.  URI Presenting symptoms: congestion, cough, rhinorrhea and sore throat   Presenting symptoms: no fever   Severity:  Mild Progression:  Unchanged Chronicity:  New Associated symptoms comment:  Bs 280 but no ketones.according to pt. Risk factors: diabetes mellitus     Past Medical History  Diagnosis Date  . 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 Fibromyalgia   . Hyperlipidemia   . Abdominal pain   . Nausea & vomiting   . Biliary dyskinesia   . Depression   . Joint pain   . Skin rash     due to medications  . Post traumatic stress disorder (PTSD)   . Sleep trouble   . Major depressive disorder   . Cervical strain   . Closed head injury 2010  . Renal stones   . Migraine     hx of none recent  . Arthritis   . Hypoglycemia   . Anxiety    Past Surgical History  Procedure Laterality Date  . Lipoma removed  yrs ago  . Knee surgery Left 2012  . Cesarean section  1997, 1999  . Cholecystectomy  02/10/2011    Procedure: LAPAROSCOPIC CHOLECYSTECTOMY WITH INTRAOPERATIVE CHOLANGIOGRAM;  Surgeon: Judieth Keens, DO;  Location: [facility];  Service: General;  Laterality: N/A;  . Robotic assisted laparoscopic lysis of adhesion N/A 03/04/2012    Procedure: ROBOTIC ASSISTED LAPAROSCOPIC LYSIS OF EXTENSIVE ADHESIONS;  Surgeon: Claiborne Billings A. Pamala Hurry, MD;  Location: [facility];  Service: Gynecology;  Laterality: N/A;  . Tonsillectomy  2001  . Abdominal hysterectomy  2001  . Eye surgery  2006    laser eye surgery left eye  . Exploratory laparomty  Mar 04, 2012  . Laparoscopic appendectomy N/A 10/12/2012     Procedure: DIAGNOSTIC APPENDECTOMY LAPAROSCOPIC;  Surgeon: Madilyn Hook, DO;  Location: [facility];  Service: General;  Laterality: N/A;  . Appendectomy     Family History  Problem Relation Age of Onset  . Cancer Father     lymphoma  . Nephrolithiasis Father   . Vascular Disease Father   . Alcohol abuse Father   . Drug abuse Father   . Cancer Maternal Grandmother     colon  . Hypertension Mother   . Heart disease Mother   . Cataracts Mother   . Depression Mother   . Diabetes Brother   . Drug abuse Brother   . Mental illness Maternal Grandfather   . Cancer Maternal Grandfather   . Heart disease Paternal Grandmother   . Heart disease Paternal Grandfather    History  Substance Use Topics  . Smoking status: Never Smoker   . Smokeless tobacco: Never Used  . Alcohol Use: No   OB History   Grav Para Term Preterm Abortions TAB SAB Ect Mult Living                 Review of Systems  Constitutional: Positive for chills and appetite change. Negative for fever.  HENT: Positive for congestion, rhinorrhea and sore throat.   Respiratory: Positive for cough.   Gastrointestinal: Positive for nausea and diarrhea. Negative for vomiting.    Allergies  Meloxicam; Cephalexin; and Ibuprofen  Home Medications   Prior to Admission medications   Medication Sig Start Date End Date Taking? Authorizing Provider  ARIPiprazole (ABILIFY) 5 MG tablet Take 1 tablet (5 mg total) by mouth at bedtime. For mood stabilization 11/02/13  Yes Leonides Grills, MD  aspirin EC 81 MG tablet Take 1 tablet (81 mg total) by mouth daily with breakfast. 10/16/13  Yes Freda Munro May Agustin, NP  atorvastatin (LIPITOR) 40 MG tablet Take 1 tablet (40 mg total) by mouth daily. Resume home meds for high cholesterol 10/16/13  Yes Freda Munro May Agustin, NP  clonazePAM (KLONOPIN) 0.5 MG tablet Take 1 tablet (0.5 mg total) by mouth 2 (two) times daily. 10/25/13 10/25/14 Yes Leonides Grills, MD  FLUoxetine (PROZAC) 20 MG capsule  Take 1 capsule (20 mg total) by mouth daily. For depression 11/08/13  Yes Leonides Grills, MD  hydrOXYzine (ATARAX/VISTARIL) 25 MG tablet Take 1 tablet (25 mg total) by mouth every 6 (six) hours as needed (Anxiety or Insomnia). For anxiety. 11/08/13  Yes Leonides Grills, MD  insulin aspart (NOVOLOG) 100 UNIT/ML injection Inject 8 Units into the skin 3 (three) times daily with meals. 10/16/13  Yes Freda Munro May Agustin, NP  insulin glargine (LANTUS) 100 UNIT/ML injection Inject 30 Units into the skin at bedtime.   Yes Historical Provider, MD  chlorpheniramine-HYDROcodone (TUSSIONEX PENNKINETIC ER) 10-8 MG/5ML LQCR Take 5 mLs by mouth every 12 (twelve) hours as needed for cough. 11/10/13   Billy Fischer, MD  ipratropium (ATROVENT) 0.06 % nasal spray Place 2 sprays into both nostrils 4 (four) times daily. 11/10/13   Billy Fischer, MD  ondansetron (ZOFRAN) 4 MG tablet Take 1 tablet (4 mg total) by mouth every 6 (six) hours. Prn n/v 11/10/13   Billy Fischer, MD   BP 105/64  Pulse 78  Temp(Src) 97.8 F (36.6 C) (Oral)  Resp 18  SpO2 99% Physical Exam  Nursing note and vitals reviewed. Constitutional: She is oriented to person, place, and time. She appears well-developed and well-nourished.  HENT:  Head: Normocephalic.  Right Ear: External ear normal.  Left Ear: External ear normal.  Mouth/Throat: Oropharynx is clear and moist.  Neck: Normal range of motion. Neck supple.  Cardiovascular: Normal rate, regular rhythm, normal heart sounds and intact distal pulses.   Pulmonary/Chest: Effort normal and breath sounds normal.  Abdominal: Soft. Bowel sounds are normal. There is no tenderness.  Lymphadenopathy:    She has no cervical adenopathy.  Neurological: She is alert and oriented to person, place, and time.  Skin: Skin is warm and dry.    ED Course  Procedures (including critical care time) Labs Review Labs Reviewed - No data to display  Imaging Review No results found.   MDM    1. Systemic viral illness        Billy Fischer, MD 11/10/13 [phone]

## 2013-11-10 NOTE — Progress Notes (Signed)
   THERAPIST PROGRESS NOTE  Session Time: 9:03AM-10:00AM   Participation Level: Active  Behavioral Response: Neat and Well GroomedAlertEuphoric  Type of Therapy: Individual Therapy  Treatment Goals addressed: Develop the ability to recognize, accept, and cope with feelings of depression, Update treatment plan  Interventions: Motivational Interviewing, Strength-based and Supportive  Summary: Maureen [name] is a 43 y.o. female who presents with symptoms of depression. This was the patients first session since being discharged from [facility]. Patient provided updates on her status of symptoms since the last session. Patient reports that she has ended the relationship with her brother and limited the communication with her father.  Patient reports that she is able to recognize the toxic relationships in her past and she is focusing on her happiness. Patient reports that she has gained positive friends and she is starting to do things for herself. Patient reports that she has not had crying spells and she has noticed that she has had less symptoms of depression since she has started focusing on her happiness and putting up boundaries.  Patient denies SI/HI and psychosis.   Suicidal/Homicidal: Nowithout intent/plan  Therapist Response: LCSW assessed the patients symptoms since her last visit. LCSW followed-up with the patient regarding her experience with MH-IOP and discussed some of what she learned in the group. LCSW assessed the patients coping skills and gained insight into what the patient has done to cope with her depression. LCSW listened actively and provided positive and encouraging feedback. LCSW discussed the importance of self-care and how focusing on happiness can be an essential part of recovery. LCSW discussed the importance of boundaries and assigned the patient homework on understanding and setting boundaries in relationships.   Plan: Return again in 1 weeks, keep scheduled  appointments, take medications as prescribed, complete homework before the next session, continue to utilize coping skills and engaging in self-care.   Diagnosis:  Major Depression, single episode, moderate     Alenna Russell, [name], LCSW 11/10/2013

## 2013-11-11 ENCOUNTER — Other Ambulatory Visit (HOSPITAL_COMMUNITY): Payer: Self-pay | Admitting: Psychiatry

## 2013-11-12 LAB — CULTURE, GROUP A STREP

## 2013-11-13 ENCOUNTER — Other Ambulatory Visit (HOSPITAL_COMMUNITY): Payer: 59

## 2013-11-13 LAB — POCT RAPID STREP A: Streptococcus, Group A Screen (Direct): NEGATIVE

## 2013-11-13 NOTE — Addendum Note (Signed)
Addended by: Erin Sons D on: 11/13/2013 12:42 PM   Modules accepted: Orders
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
## 2013-11-13 NOTE — Telephone Encounter (Signed)
Left HU:TMLY RX for Abilify.Needs a new one. NOTE:Was given Rx on 11/02/13 by Dr. Salem Senate Myles Gip

## 2013-11-13 NOTE — Telephone Encounter (Signed)
Previous Pt of NP who is no longer with the practice. Appt as new pt w/ Dr. Doyne Keel on 11/5.

## 2013-11-14 ENCOUNTER — Other Ambulatory Visit (HOSPITAL_COMMUNITY): Payer: 59

## 2013-11-15 ENCOUNTER — Other Ambulatory Visit (HOSPITAL_COMMUNITY): Payer: 59

## 2013-11-15 NOTE — Telephone Encounter (Signed)
Pt stopped by stating that she had lost the prescription Dr. Salem Senate had given her last week on her last day.  Reports that she has run out of her Abilify.  A:  Informed Dr. Salem Senate.  Called [facility] [phone]) spoke to [facility].  Refilled Abilify 5 mg at hs, #30, no refills.  R:  Pt receptive.

## 2013-11-16 ENCOUNTER — Other Ambulatory Visit (HOSPITAL_COMMUNITY): Payer: 59

## 2013-11-17 ENCOUNTER — Other Ambulatory Visit (HOSPITAL_COMMUNITY): Payer: 59

## 2013-11-17 ENCOUNTER — Ambulatory Visit (INDEPENDENT_AMBULATORY_CARE_PROVIDER_SITE_OTHER): Payer: 59 | Admitting: Licensed Clinical Social Worker

## 2013-11-17 DIAGNOSIS — F321 Major depressive disorder, single episode, moderate: Secondary | ICD-10-CM
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
## 2013-11-19 IMAGING — CR DG CHEST 2V
2 series · 2 of 2 positions shown · non-contrast
Comparison: Chest radiograph performed 06/23/2011

CLINICAL DATA: Chest pain.

CHEST - 2 VIEW

[w chest pa]
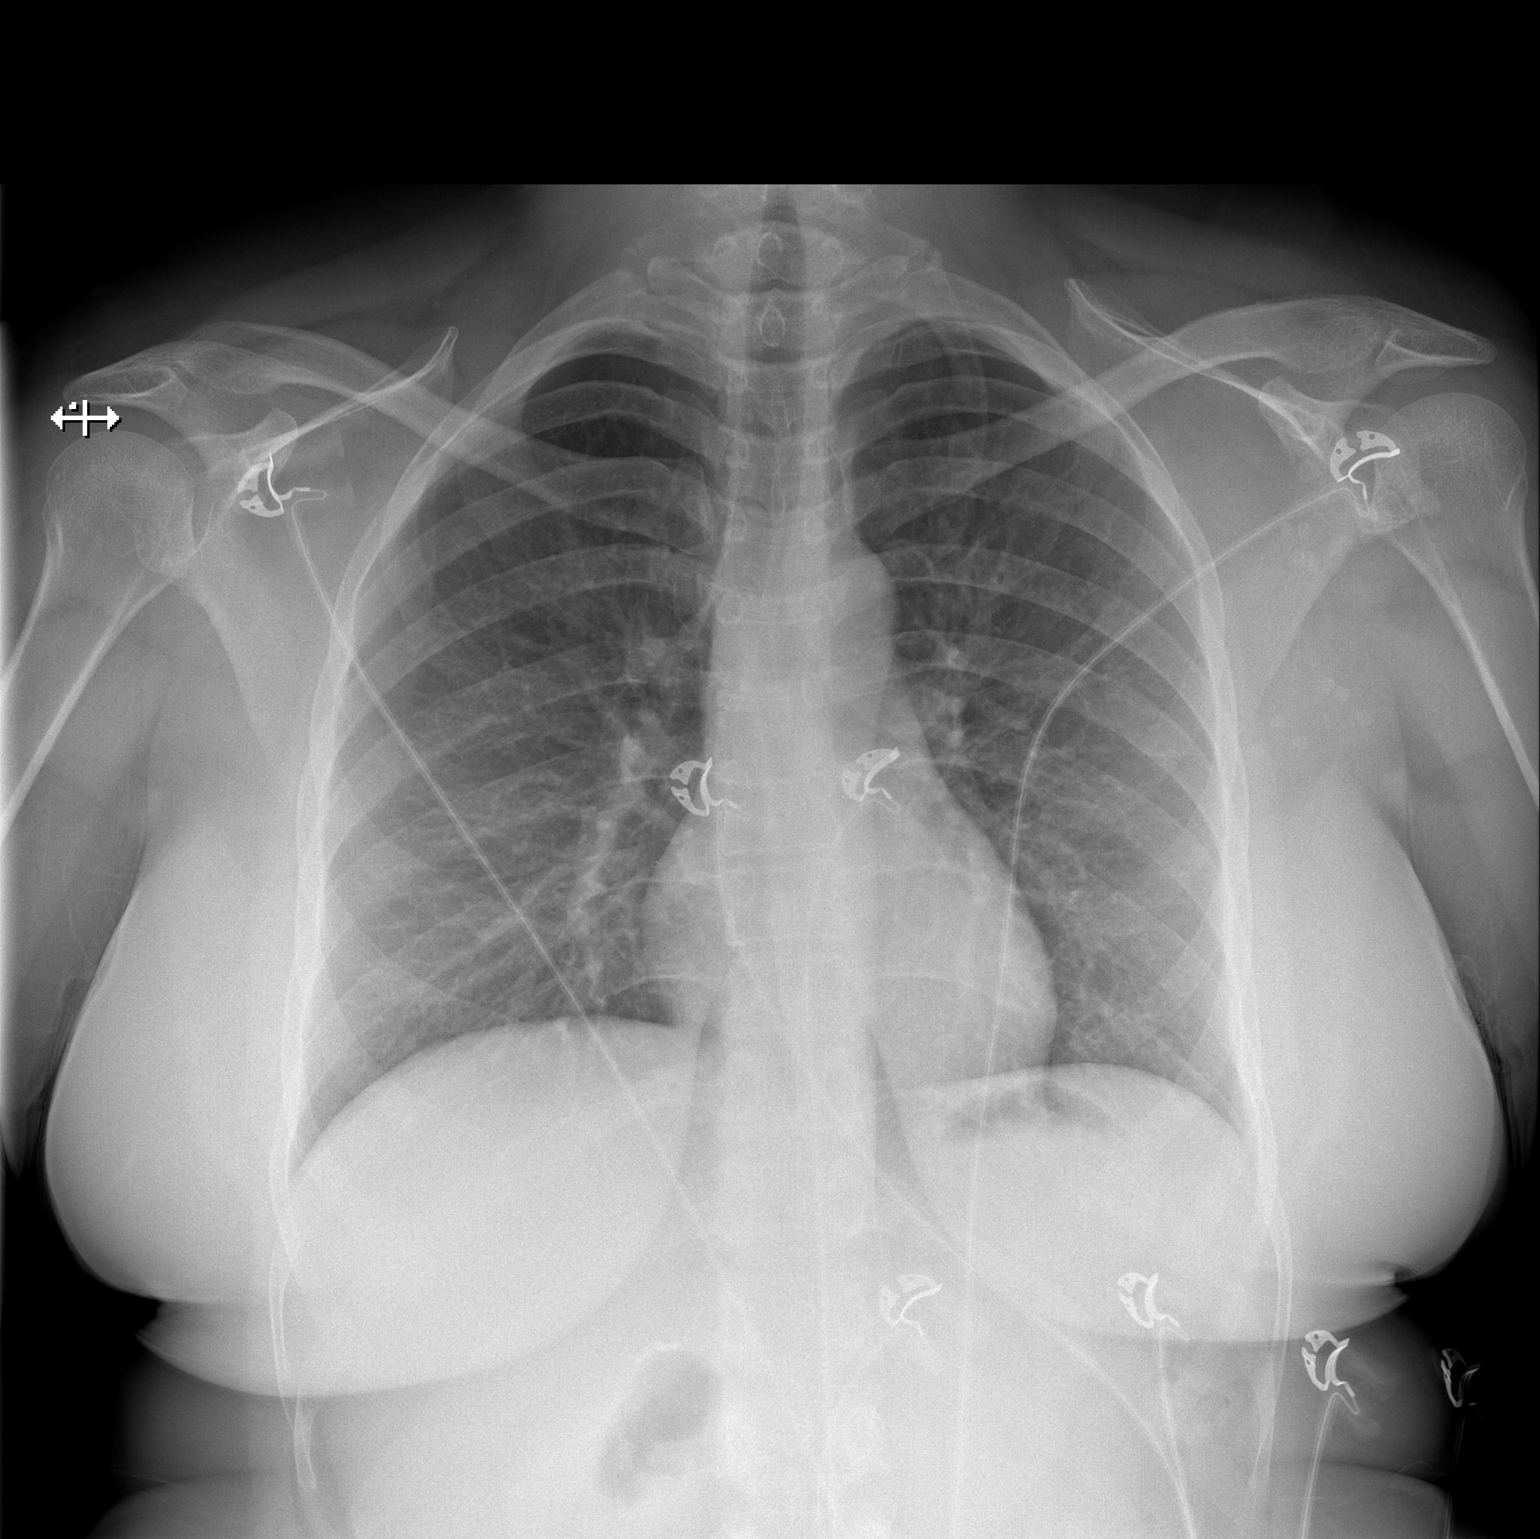

[w chest lat]
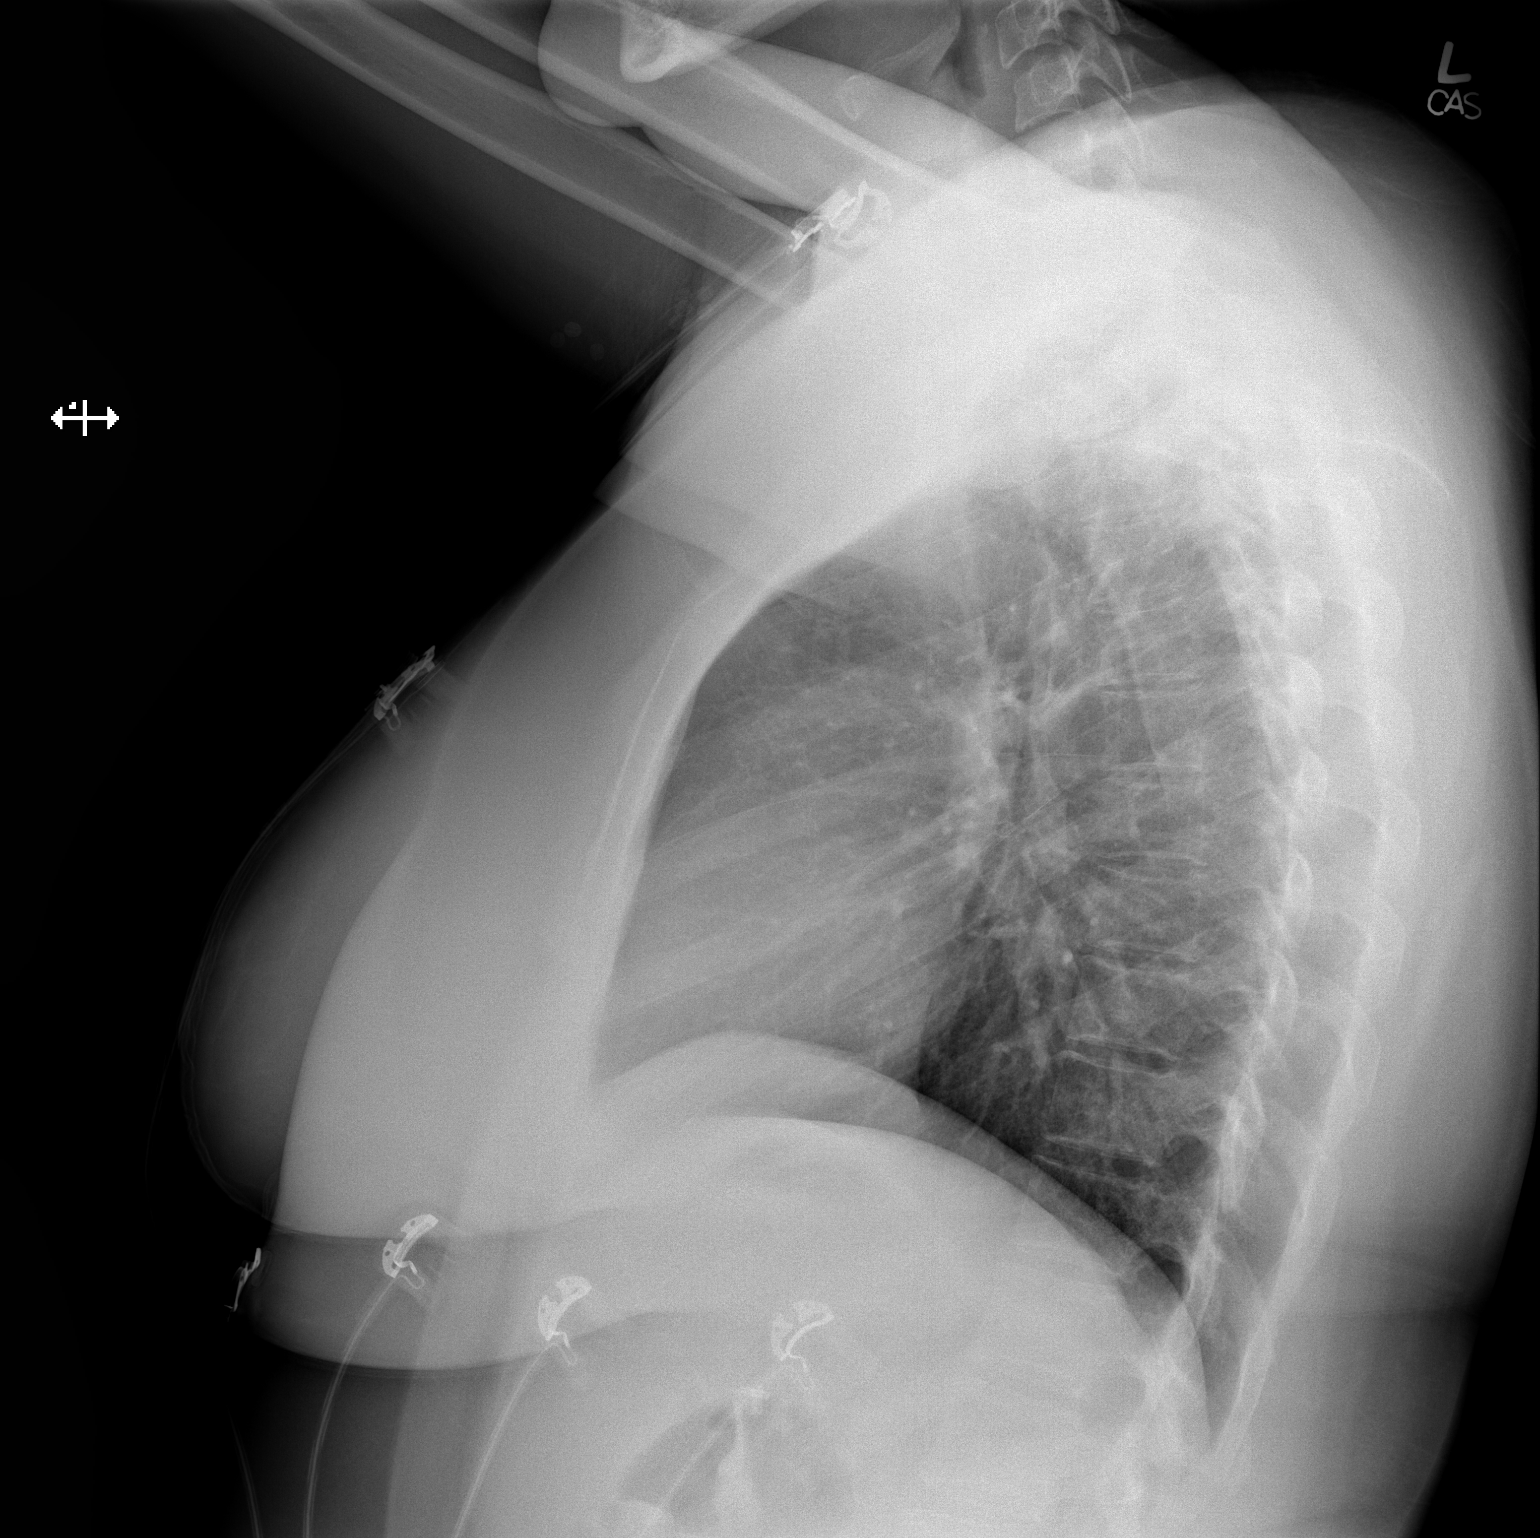

[2 of 2 positions shown; findings below may reference images not displayed]

FINDINGS: The lungs are well-aerated and clear.  There is no
evidence of focal opacification, pleural effusion or pneumothorax.

The heart is normal in size; the mediastinal contour is within
normal limits.  No acute osseous abnormalities are seen.  Small
nodular densities overlying the left axilla are thought to be
outside the patient.
IMPRESSION: No acute cardiopulmonary process seen.

## 2013-11-20 ENCOUNTER — Other Ambulatory Visit (HOSPITAL_COMMUNITY): Payer: 59

## 2013-11-20 IMAGING — CT CT ABD-PELV W/ CM
1 of 2 series · 15 of 32 positions shown, 19 images · IV contrast (omnipaque)
Comparison: CT of the abdomen and pelvis performed 08/12/2011

CLINICAL DATA: Abdominal pain and nausea; hyperglycemia.
Leukocytosis.

CT ABDOMEN AND PELVIS WITH CONTRAST
TECHNIQUE: Multidetector CT imaging of the abdomen and pelvis was
performed following the standard protocol during bolus
administration of intravenous contrast.
Contrast: 100mL OMNIPAQUE IOHEXOL 300 MG/ML  SOLN

[Series 2: abd/pel with · axial · 0.65mm/px · z∈[+1228,+1598]mm · 15 of 82 slices shown, 19 images]
[im 4/82  soft-tissue]
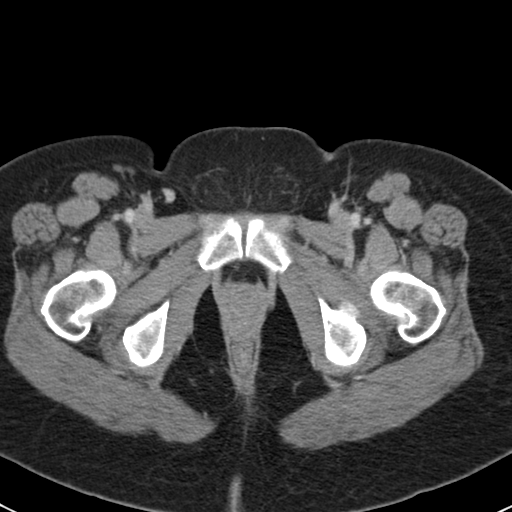
[im 4/82  bone]
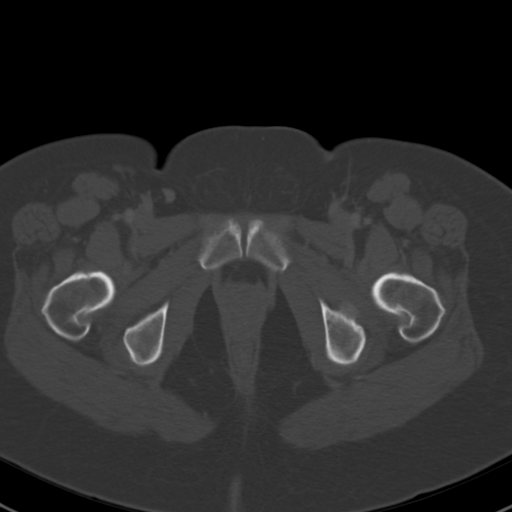
[im 10/82  soft-tissue]
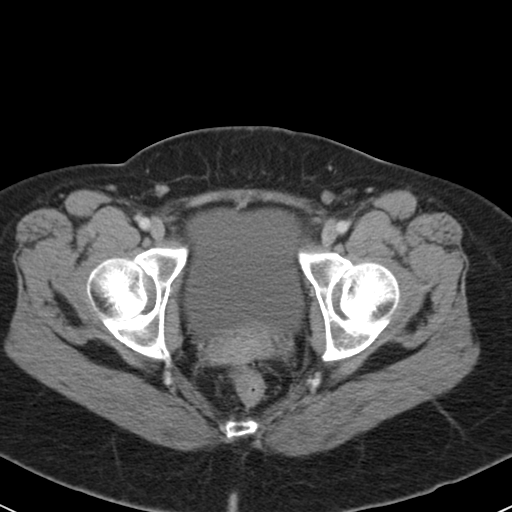
[im 16/82  soft-tissue]
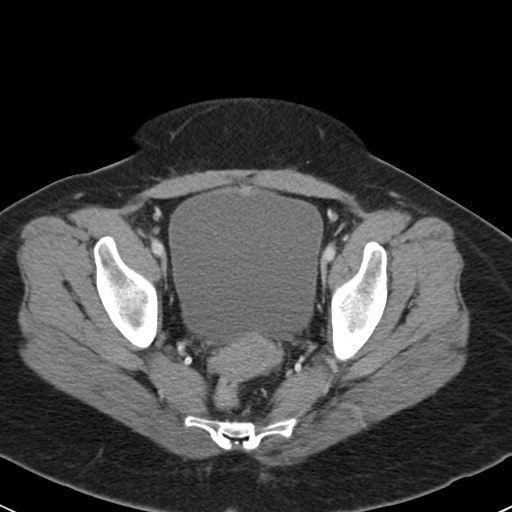
[im 22/82  soft-tissue]
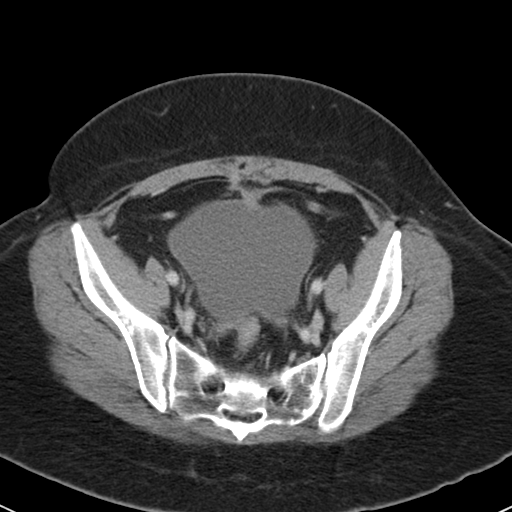
[im 29/82  soft-tissue]
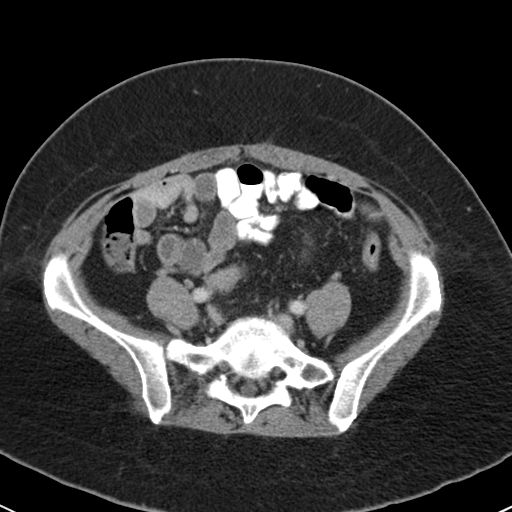
[im 35/82  soft-tissue]
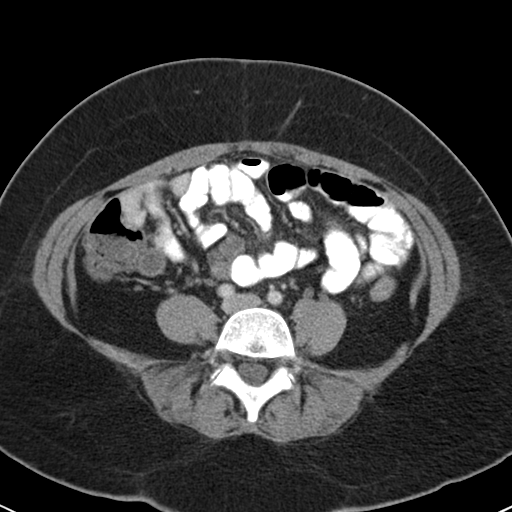
[im 41/82  soft-tissue]
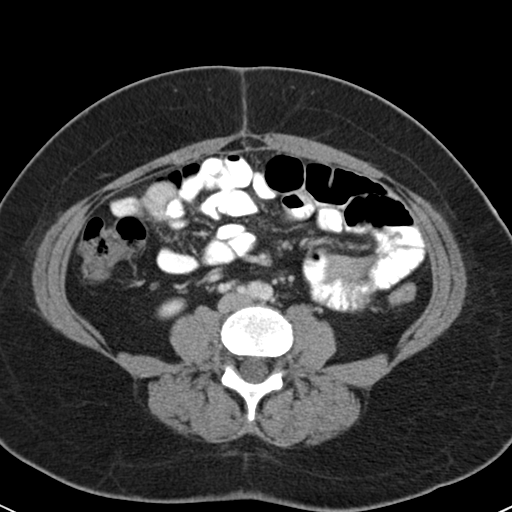
[im 47/82  soft-tissue]
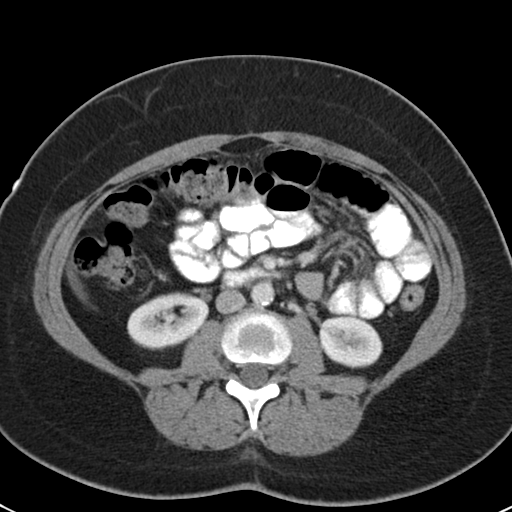
[im 53/82  soft-tissue]
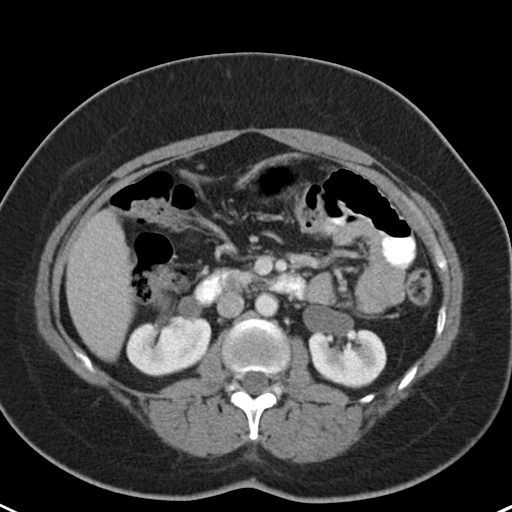
[im 53/82  bone]
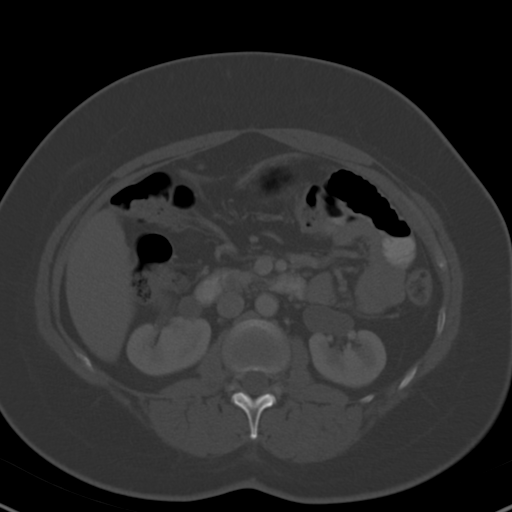
[im 60/82  soft-tissue]
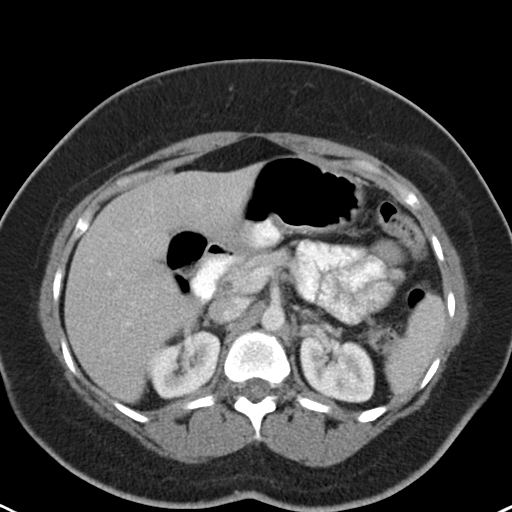
[im 66/82  soft-tissue]
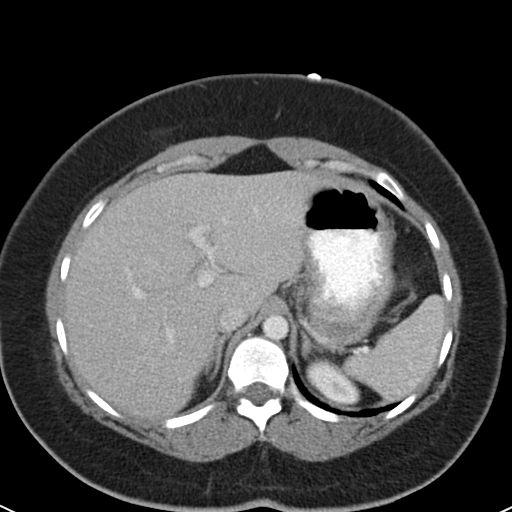
[im 69/82  lung]
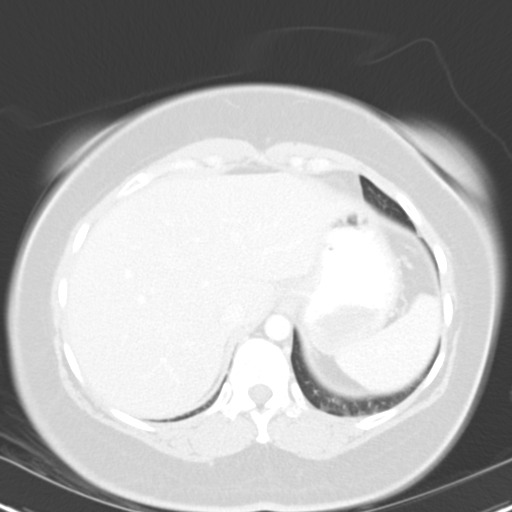
[im 72/82  soft-tissue]
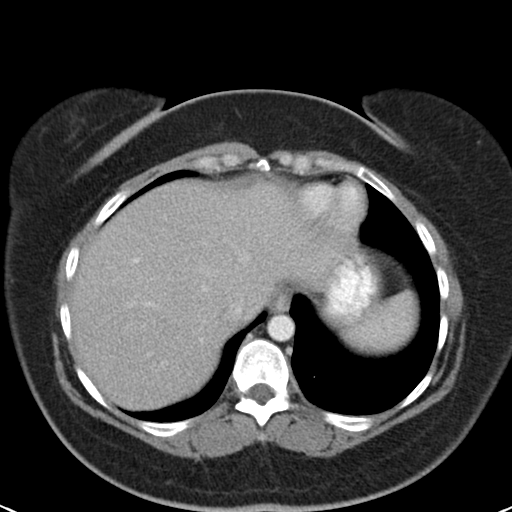
[im 72/82  lung]
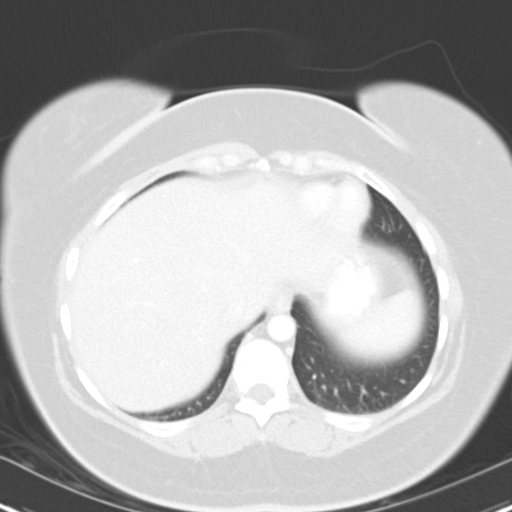
[im 75/82  lung]
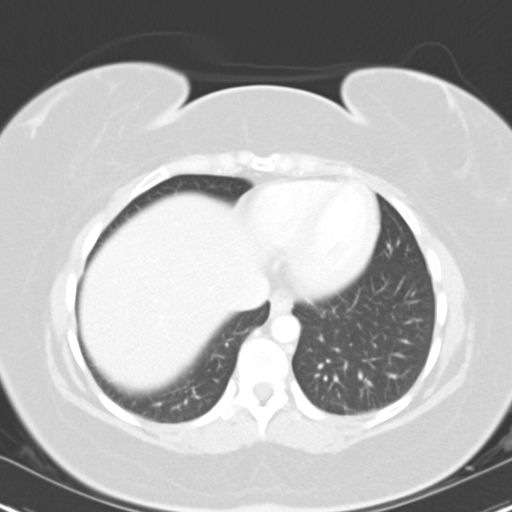
[im 78/82  soft-tissue]
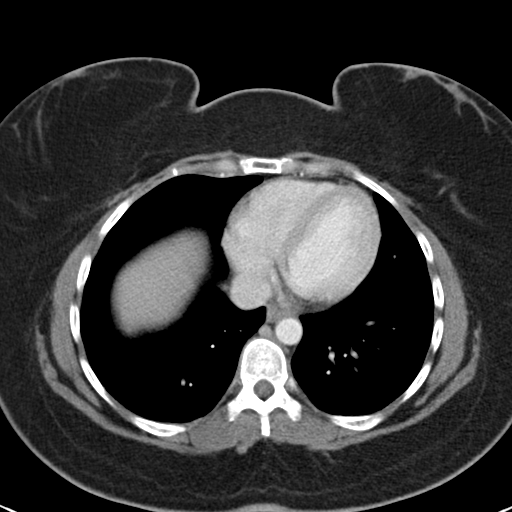
[im 78/82  lung]
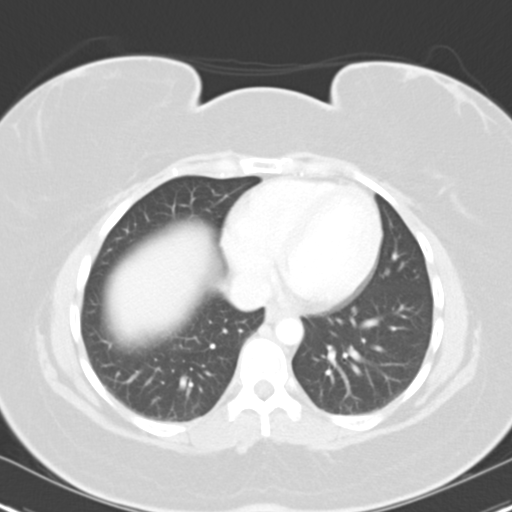

[15 of 32 positions shown; findings below may reference images not displayed]

FINDINGS: The visualized lung bases are clear.

A small 7 mm nodule near the chest wall at the inferior right
breast is stable from 1887 and likely benign.

The liver and spleen are unremarkable in appearance.  The
gallbladder is within normal limits.  The pancreas and adrenal
glands are unremarkable.

There is incomplete rotation of both kidneys; the kidneys are
otherwise unremarkable in appearance.  There is no evidence of
hydronephrosis.  No renal or ureteral stones are seen.  No
perinephric stranding is appreciated.

No free fluid is identified.  The small bowel is unremarkable in
appearance.  The stomach is within normal limits.  No acute
vascular abnormalities are seen.

The appendix is normal in caliber, without evidence for
appendicitis.  The colon is largely decompressed and is
unremarkable in appearance.

The bladder is moderately distended and grossly unremarkable. The
patient is status post hysterectomy.  No suspicious adnexal masses
are seen.  Minimal stranding along the anterior abdominal and
pelvic wall appears to reflect a small accessory muscle flap, and
chronic scarring overlying the bladder.  No inguinal
lymphadenopathy is seen.

No acute osseous abnormalities are identified.
IMPRESSION: No acute abnormalities seen within the abdomen or pelvis.

## 2013-11-21 ENCOUNTER — Other Ambulatory Visit (HOSPITAL_COMMUNITY): Payer: 59

## 2013-11-22 ENCOUNTER — Other Ambulatory Visit (HOSPITAL_COMMUNITY): Payer: 59

## 2013-11-23 ENCOUNTER — Ambulatory Visit (INDEPENDENT_AMBULATORY_CARE_PROVIDER_SITE_OTHER): Payer: 59 | Admitting: Psychiatry

## 2013-11-23 ENCOUNTER — Encounter (HOSPITAL_COMMUNITY): Payer: Self-pay | Admitting: Psychiatry

## 2013-11-23 ENCOUNTER — Other Ambulatory Visit (HOSPITAL_COMMUNITY): Payer: 59

## 2013-11-23 VITALS — BP 118/67 | HR 93 | Ht 64.0 in | Wt 159.0 lb

## 2013-11-23 DIAGNOSIS — F331 Major depressive disorder, recurrent, moderate: Secondary | ICD-10-CM

## 2013-11-23 DIAGNOSIS — F411 Generalized anxiety disorder: Secondary | ICD-10-CM

## 2013-11-23 DIAGNOSIS — F4310 Post-traumatic stress disorder, unspecified: Secondary | ICD-10-CM

## 2013-11-23 MED ORDER — CLONAZEPAM 0.5 MG PO TABS: 0.5000 mg | ORAL_TABLET | Freq: Two times a day (BID) | ORAL | Status: DC

## 2013-11-23 MED ORDER — FLUOXETINE HCL 20 MG PO CAPS: 20.0000 mg | ORAL_CAPSULE | Freq: Every day | ORAL | Status: DC

## 2013-11-23 MED ORDER — ARIPIPRAZOLE 5 MG PO TABS: 5.0000 mg | ORAL_TABLET | Freq: Every day | ORAL | Status: DC

## 2013-11-23 MED ORDER — HYDROXYZINE HCL 25 MG PO TABS: 25.0000 mg | ORAL_TABLET | Freq: Four times a day (QID) | ORAL | Status: DC | PRN
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
## 2013-11-24 ENCOUNTER — Other Ambulatory Visit (HOSPITAL_COMMUNITY): Payer: 59

## 2013-11-24 ENCOUNTER — Ambulatory Visit (HOSPITAL_COMMUNITY): Payer: Self-pay | Admitting: Licensed Clinical Social Worker

## 2013-11-27 ENCOUNTER — Other Ambulatory Visit (HOSPITAL_COMMUNITY): Payer: 59

## 2013-11-28 ENCOUNTER — Other Ambulatory Visit (HOSPITAL_COMMUNITY): Payer: 59

## 2013-11-29 ENCOUNTER — Other Ambulatory Visit (HOSPITAL_COMMUNITY): Payer: 59

## 2013-11-29 NOTE — Progress Notes (Signed)
Patient Discharge Instructions:  Next Level Care Provider Has Access to the EMR, 11/29/13  Records provided to [facility] via [facility]/Epic access.  Patsey Berthold, 11/29/2013, 12:53 PM

## 2013-11-30 ENCOUNTER — Other Ambulatory Visit (HOSPITAL_COMMUNITY): Payer: 59

## 2013-12-01 ENCOUNTER — Encounter (HOSPITAL_COMMUNITY): Payer: Self-pay | Admitting: Licensed Clinical Social Worker

## 2013-12-01 ENCOUNTER — Other Ambulatory Visit (HOSPITAL_COMMUNITY): Payer: 59

## 2013-12-01 ENCOUNTER — Ambulatory Visit (INDEPENDENT_AMBULATORY_CARE_PROVIDER_SITE_OTHER): Payer: 59 | Admitting: Licensed Clinical Social Worker

## 2013-12-01 DIAGNOSIS — F321 Major depressive disorder, single episode, moderate: Secondary | ICD-10-CM

## 2013-12-01 NOTE — Progress Notes (Deleted)
   THERAPIST PROGRESS NOTE  Session Time: ***  Participation Level: Active  Behavioral Response: Neat and Well GroomedAlertHappy  Type of Therapy: Individual Therapy  Treatment Goals addressed: Communication: with her family and Coping  Interventions: Motivational Interviewing, Strength-based and Supportive  Summary: Maureen [name] is a 43 y.o. female who presents with ***.   Suicidal/Homicidal: {BHH YES OR NO:22294}{yes/no/with/without intent/plan:22693}  Therapist Response: ***  Plan: Return again in *** weeks.  Diagnosis:  Generalized Anxiety Disorder       Henriette Combs 12/01/2013

## 2013-12-01 NOTE — Progress Notes (Signed)
   THERAPIST PROGRESS NOTE  Session Time: 95:18AC-16:60YT  Participation Level: Active  Behavioral Response: Neat and Well GroomedAlertCheerful mood  Type of Therapy: Family Therapy  Treatment Goals addressed: Communication: with family and Coping  Interventions: Motivational Interviewing, Strength-based, Supportive and Family Systems  Summary: Maureen [name] is a 43 y.o. female who presents with symptoms of depression. Patient and her oldest son attended the session due to the separation, patient wanted to ask her son questions in a safe environment with the support of the LCSW. Patient discussed the separation with her son and assessed the son's safrty. Patient ensured her son that she would like for him to maintain a respectful relationship with his father despite what transpired in the marriage.  [name] reports that he feels that he has adjusted well, he does not harbor any anger towards his father, and he felt "neutral" towards the situation. [name] reports that he does not feel that he needs to choose and he has had an "up and down" relationship with his father. [name] reports that he is happy to have [employer] as a resource and will utilize the services there if needed. Patient and her son developed a safety plan for how the son would interact with her husband if he comes home and appears angry. [name] agreed to follow through with the plan to go to her best friends house and she reports that her best friend is aware. Patient reports that she will go to her best friends house and allow her husband to spend time with their youngest son in their home. Patient reports that she would like to keep a safe distance in order to reduce the anxiety of being around her husband. Patient does not fear for her safety, but would like to develop healthy boundaries with her husband. Patient reports that he has not experienced symptoms of depression as of late and has  limited anxiety that only seems to be related to her husband showing up unexpectedly.  Patient and her son reports that they feel safe and they will follow the safety plan in the event that there is an altercation in the home.  Patient denies SI/HI and psychosis. .   Suicidal/Homicidal: Nowithout intent/plan  Therapist Response: LCSW assessed the patient's symptom and safety.  LCSW spoke with the patient and her son regarding the events that have transpired over the past six months and how they have impacted the family. LCSW assessed the son's safety and how the separation had impacted him. LCSW asked the patient's son how he feels this will impact him in the future as it relates to his family and growing into an adult. LCSW listened actively, asked probing questions, and provided feedback as needed. LCSW confirmed the safety plan with the patient and her son. LCSW discussed boundaries and limitations with the patient.    Plan: Return again in 2 weeks, continue to take medications as prescribed, keep scheduled appointments, follow safety plan if needed, utilize mobile crisis or the ED in the case of a mental health emergency.    Diagnosis:  Major Depression, single episode, moderate       Keonte Daubenspeck, [name], LCSW 12/01/2013

## 2013-12-04 ENCOUNTER — Other Ambulatory Visit (HOSPITAL_COMMUNITY): Payer: 59

## 2013-12-05 ENCOUNTER — Other Ambulatory Visit (HOSPITAL_COMMUNITY): Payer: 59

## 2013-12-12 ENCOUNTER — Ambulatory Visit (INDEPENDENT_AMBULATORY_CARE_PROVIDER_SITE_OTHER): Payer: 59 | Admitting: Licensed Clinical Social Worker

## 2013-12-12 DIAGNOSIS — F321 Major depressive disorder, single episode, moderate: Secondary | ICD-10-CM
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
## 2013-12-22 ENCOUNTER — Ambulatory Visit (INDEPENDENT_AMBULATORY_CARE_PROVIDER_SITE_OTHER): Payer: 59 | Admitting: Licensed Clinical Social Worker

## 2013-12-22 DIAGNOSIS — F321 Major depressive disorder, single episode, moderate: Secondary | ICD-10-CM

## 2013-12-25 ENCOUNTER — Other Ambulatory Visit: Payer: Self-pay | Admitting: Family Medicine

## 2013-12-26 ENCOUNTER — Telehealth (HOSPITAL_COMMUNITY): Payer: Self-pay

## 2013-12-26 ENCOUNTER — Ambulatory Visit (HOSPITAL_COMMUNITY): Payer: Self-pay | Admitting: Licensed Clinical Social Worker

## 2013-12-26 ENCOUNTER — Telehealth (HOSPITAL_COMMUNITY): Payer: Self-pay | Admitting: Licensed Clinical Social Worker

## 2013-12-26 NOTE — Telephone Encounter (Signed)
LCSW contacted patient due to missed visit today and patient reports that she forgot about the appointment but she went to her attorney and they accepted her case.  Patient reports that she is doing ok.

## 2013-12-26 NOTE — Progress Notes (Signed)
   THERAPIST PROGRESS NOTE  Session Time: 1:00PM-2:00PM  Participation Level: Active  Behavioral Response: Casual and NeatAlertAnxious  Type of Therapy: Individual Therapy  Treatment Goals addressed: Anxiety and Coping  Interventions: CBT, Motivational Interviewing and Strength-based  Summary: Maureen [name] is a 43 y.o. female who presents with symptoms of depression. Patient discussed her court date with her husband and anxiety related to the 50B that has been issued for her husband. Patient reports that her case has been continued an discussed her concern with the lack of communication with her case worker. patient discussed the sessions with her sons and how she feels relieved knowing how they feel and that they have the option to attend therapy if needed. Patient reports that she has continued to work on her boundaries and she realized that this is something that she will continue to work on after her divorce. Patient reports that she has also realized the support that she has in her family, friends, and community network. Patient discussed safety and support. Patient discussed transitioning from being married to moving forward with her divorce and the difficulties associated with the transition. Patient denies any symptoms of depression and reports that she has been in a positive mood and is looking forward to the future.  Patient denies SI/HI and psychosis.   .   Suicidal/Homicidal: Nowithout intent/plan  Therapist Response: LCSW assessed patient for symptoms since the last visit. LCSW followed up with the patient regarding her restraining order against her husband. LCSW discussed the patients safety and support systems that are currently in place. LCSW discussed the previous session with her sons and how the patient has felt since the session.  LCSW discussed boundaries and how they shape relationships. LCSW discussed how setting boundaries can be difficult, but are necessary in  forming relationships. LCSW asked the patient to discussed how lack of boundaries have impacted her relationships in the past.  LCSW asked patient to describe how she would like for her boundaries to look moving forward. LCSW asked patient how she feels that her boundaries will change due to the change in her relationship status. LCSW listened actively and provided encouraging feedback as needed.     Plan: Return again in 1 weeks, complete boundary homework, keep scheduled appointments, take medications as prescribed, utilize mobile crisis or ED in the case of a mental health emergency.   Diagnosis:  Major Depression, single episode, moderate      Jeydi Klingel M, LCSW 12/26/2013

## 2013-12-28 ENCOUNTER — Telehealth (HOSPITAL_COMMUNITY): Payer: Self-pay | Admitting: Licensed Clinical Social Worker

## 2013-12-28 NOTE — Telephone Encounter (Signed)
LCSW spoke with patients son due to patient missing appointment and not picking up calls. Patient son reports that  Patient is fine and at home and she may not have answered due to unknown number.

## 2013-12-29 DIAGNOSIS — Z87442 Personal history of urinary calculi: Secondary | ICD-10-CM | POA: Diagnosis not present

## 2013-12-29 DIAGNOSIS — Z79899 Other long term (current) drug therapy: Secondary | ICD-10-CM | POA: Diagnosis not present

## 2013-12-29 DIAGNOSIS — E1042 Type 1 diabetes mellitus with diabetic polyneuropathy: Secondary | ICD-10-CM | POA: Diagnosis not present

## 2013-12-29 DIAGNOSIS — F329 Major depressive disorder, single episode, unspecified: Secondary | ICD-10-CM | POA: Diagnosis not present

## 2013-12-29 DIAGNOSIS — F419 Anxiety disorder, unspecified: Secondary | ICD-10-CM | POA: Diagnosis not present

## 2013-12-29 DIAGNOSIS — M1990 Unspecified osteoarthritis, unspecified site: Secondary | ICD-10-CM | POA: Insufficient documentation

## 2013-12-29 DIAGNOSIS — Z8719 Personal history of other diseases of the digestive system: Secondary | ICD-10-CM | POA: Insufficient documentation

## 2013-12-29 DIAGNOSIS — M797 Fibromyalgia: Secondary | ICD-10-CM | POA: Insufficient documentation

## 2013-12-29 DIAGNOSIS — Z794 Long term (current) use of insulin: Secondary | ICD-10-CM | POA: Diagnosis not present

## 2013-12-29 DIAGNOSIS — Z87828 Personal history of other (healed) physical injury and trauma: Secondary | ICD-10-CM | POA: Insufficient documentation

## 2013-12-29 DIAGNOSIS — E785 Hyperlipidemia, unspecified: Secondary | ICD-10-CM | POA: Diagnosis not present

## 2013-12-29 DIAGNOSIS — M542 Cervicalgia: Secondary | ICD-10-CM | POA: Diagnosis present

## 2013-12-29 DIAGNOSIS — G43909 Migraine, unspecified, not intractable, without status migrainosus: Secondary | ICD-10-CM | POA: Diagnosis not present

## 2013-12-29 DIAGNOSIS — M436 Torticollis: Secondary | ICD-10-CM | POA: Diagnosis not present

## 2013-12-29 DIAGNOSIS — Z7982 Long term (current) use of aspirin: Secondary | ICD-10-CM | POA: Insufficient documentation

## 2013-12-30 ENCOUNTER — Other Ambulatory Visit (HOSPITAL_COMMUNITY): Payer: Self-pay | Admitting: Psychiatry

## 2013-12-30 ENCOUNTER — Emergency Department (HOSPITAL_COMMUNITY)
Admission: EM | Admit: 2013-12-30 | Discharge: 2013-12-30 | Disposition: A | Discharge status: Home / Self Care | Payer: 59 | Attending: Emergency Medicine | Admitting: Emergency Medicine

## 2013-12-30 ENCOUNTER — Encounter (HOSPITAL_COMMUNITY): Payer: Self-pay | Admitting: Emergency Medicine

## 2013-12-30 DIAGNOSIS — M436 Torticollis: Secondary | ICD-10-CM

## 2013-12-30 MED ORDER — DIAZEPAM 5 MG PO TABS: 5.0000 mg | ORAL_TABLET | Freq: Three times a day (TID) | ORAL | Status: DC | PRN

## 2013-12-30 MED ORDER — DIAZEPAM 5 MG PO TABS
5.0000 mg | ORAL_TABLET | Freq: Once | ORAL | Status: AC
Start: 2013-12-30 — End: 2013-12-30
  Administered 2013-12-30: 5 mg via ORAL
  Filled 2013-12-30: qty 1

## 2013-12-30 NOTE — ED Notes (Signed)
Pt placed in a gown with BP cuff on and pulse ox on

## 2013-12-30 NOTE — ED Notes (Signed)
Pt. reports right side neck pain onset 2 days ago , denies injury , pain worse with movement and when facing to right , denies fever , no nausea or vomitting .

## 2013-12-31 NOTE — ED Provider Notes (Signed)
CSN: [id number]     Arrival date & time 12/29/13  2349 History   First MD Initiated Contact with Patient 12/30/13 0325     Chief Complaint  Patient presents with  . Neck Pain     (Consider location/radiation/quality/duration/timing/severity/associated sxs/prior Treatment) HPI Comments: 2 dasysof R sided neck pain denies injury previous pain   Patient is a 43 y.o. female presenting with neck pain. The history is provided by the patient.  Neck Pain Pain location:  R side Quality:  Aching Pain radiates to:  Does not radiate Pain severity:  Moderate Onset quality:  Gradual Duration:  2 days Timing:  Constant Progression:  Worsening Chronicity:  New Relieved by:  Nothing Worsened by:  Position Ineffective treatments:  None tried Associated symptoms: no fever and no headaches     Past Medical History  Diagnosis Date  . 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 Fibromyalgia   . Hyperlipidemia   . Abdominal pain   . Nausea & vomiting   . Biliary dyskinesia   . Depression   . Joint pain   . Skin rash     due to medications  . Post traumatic stress disorder (PTSD)   . Sleep trouble   . Major depressive disorder   . Cervical strain   . Closed head injury 2010  . Renal stones   . Migraine     hx of none recent  . Arthritis   . Hypoglycemia   . Anxiety    Past Surgical History  Procedure Laterality Date  . Lipoma removed  yrs ago  . Knee surgery Left 2012  . Cesarean section  1997, 1999  . Cholecystectomy  02/10/2011    Procedure: LAPAROSCOPIC CHOLECYSTECTOMY WITH INTRAOPERATIVE CHOLANGIOGRAM;  Surgeon: Judieth Keens, DO;  Location: [facility];  Service: General;  Laterality: N/A;  . Robotic assisted laparoscopic lysis of adhesion N/A 03/04/2012    Procedure: ROBOTIC ASSISTED LAPAROSCOPIC LYSIS OF EXTENSIVE ADHESIONS;  Surgeon: Claiborne Billings A. Pamala Hurry, MD;  Location: [facility];  Service: Gynecology;  Laterality: N/A;  . Tonsillectomy  2001  .  Abdominal hysterectomy  2001  . Eye surgery  2006    laser eye surgery left eye  . Exploratory laparomty  Mar 04, 2012  . Laparoscopic appendectomy N/A 10/12/2012    Procedure: DIAGNOSTIC APPENDECTOMY LAPAROSCOPIC;  Surgeon: Madilyn Hook, DO;  Location: [facility];  Service: General;  Laterality: N/A;  . Appendectomy     Family History  Problem Relation Age of Onset  . Cancer Father     lymphoma  . Nephrolithiasis Father   . Vascular Disease Father   . Alcohol abuse Father   . Drug abuse Father   . Cancer Maternal Grandmother     colon  . Hypertension Mother   . Heart disease Mother   . Cataracts Mother   . Depression Mother   . Diabetes Brother   . Drug abuse Brother   . Mental illness Maternal Grandfather   . Cancer Maternal Grandfather   . Heart disease Paternal Grandmother   . Heart disease Paternal Grandfather   . Suicidality Maternal Uncle    History  Substance Use Topics  . Smoking status: Never Smoker   . Smokeless tobacco: Never Used  . Alcohol Use: No   OB History    No data available     Review of Systems  Constitutional: Negative for fever.  HENT: Negative for sore throat, tinnitus and trouble swallowing.   Musculoskeletal:  Positive for neck pain. Negative for neck stiffness.  Neurological: Negative for dizziness and headaches.  All other systems reviewed and are negative.     Allergies  Meloxicam; Cephalexin; and Ibuprofen  Home Medications   Prior to Admission medications   Medication Sig Start Date End Date Taking? Authorizing Provider  ARIPiprazole (ABILIFY) 5 MG tablet Take 1 tablet (5 mg total) by mouth at bedtime. For mood stabilization 11/23/13   Charlcie Cradle, MD  aspirin EC 81 MG tablet Take 1 tablet (81 mg total) by mouth daily with breakfast. 10/16/13   Janett Labella, NP  atorvastatin (LIPITOR) 40 MG tablet Take 1 tablet (40 mg total) by mouth daily. Resume home meds for high cholesterol 10/16/13   [facility], NP  atorvastatin  (LIPITOR) 40 MG tablet Take 1 tablet (40 mg total) by mouth daily. PATIENT NEEDS OFFICE VISIT/LABS FOR ADDITIONAL REFILLS 12/26/13   [name], MD  chlorpheniramine-HYDROcodone [facility]) 10-8 MG/5ML LQCR Take 5 mLs by mouth every 12 (twelve) hours as needed for cough. 11/10/13   Billy Fischer, MD  clonazePAM (KLONOPIN) 0.5 MG tablet Take 1 tablet (0.5 mg total) by mouth 2 (two) times daily. 11/23/13 11/23/14  Charlcie Cradle, MD  diazepam (VALIUM) 5 MG tablet Take 1 tablet (5 mg total) by mouth every 8 (eight) hours as needed for anxiety or muscle spasms. 12/30/13   Garald Balding, NP  FLUoxetine (PROZAC) 20 MG capsule Take 1 capsule (20 mg total) by mouth daily. For depression 11/23/13   Charlcie Cradle, MD  hydrOXYzine (ATARAX/VISTARIL) 25 MG tablet Take 1 tablet (25 mg total) by mouth every 6 (six) hours as needed (Anxiety or Insomnia). For anxiety. 11/23/13   Charlcie Cradle, MD  insulin aspart (NOVOLOG) 100 UNIT/ML injection Inject 8 Units into the skin 3 (three) times daily with meals. 10/16/13   [place], NP  insulin glargine (LANTUS) 100 UNIT/ML injection Inject 30 Units into the skin at bedtime.    Historical Provider, MD  ipratropium (ATROVENT) 0.06 % nasal spray Place 2 sprays into both nostrils 4 (four) times daily. 11/10/13   Billy Fischer, MD  ondansetron (ZOFRAN) 4 MG tablet Take 1 tablet (4 mg total) by mouth every 6 (six) hours. Prn n/v 11/10/13   Billy Fischer, MD   BP 138/66 mmHg  Pulse 67  Temp(Src) 97.8 F (36.6 C) (Oral)  Resp 18  SpO2 99% Physical Exam  Constitutional: She appears well-developed and well-nourished.  HENT:  Head: Normocephalic.  Eyes: Pupils are equal, round, and reactive to light.  Neck: Muscular tenderness present. No spinous process tenderness present.    Cardiovascular: Normal rate.   Pulmonary/Chest: Effort normal.  Musculoskeletal: Normal range of motion.  Neurological: She is alert.  Skin: Skin is warm. No rash noted.    Nursing note and vitals reviewed.   ED Course  Procedures (including critical care time) Labs Review Labs Reviewed - No data to display  Imaging Review No results found.   EKG Interpretation None      MDM   Final diagnoses:  Torticollis, acquired        Garald Balding, NP 12/31/13 2001  Julianne Rice, MD 01/01/14 [id number]

## 2014-01-02 ENCOUNTER — Ambulatory Visit (INDEPENDENT_AMBULATORY_CARE_PROVIDER_SITE_OTHER): Payer: 59 | Admitting: Licensed Clinical Social Worker

## 2014-01-02 DIAGNOSIS — F321 Major depressive disorder, single episode, moderate: Secondary | ICD-10-CM

## 2014-01-03 ENCOUNTER — Other Ambulatory Visit (HOSPITAL_COMMUNITY): Payer: Self-pay | Admitting: *Deleted

## 2014-01-03 NOTE — Progress Notes (Signed)
   THERAPIST PROGRESS NOTE  Session Time: 4:00PM-5:00PM  Participation Level: Active  Behavioral Response: Casual and NeatAlertHappy  Type of Therapy: Individual Therapy  Treatment Goals addressed: Coping Boundaries  Interventions: CBT, Motivational Interviewing and Supportive  Summary: Maureen [name] is a 43 y.o. female who presents with symptoms of depression in the past. Patient reports that she has not experienced symptoms of depression. Patient reports that she feels that she has been coping well and "cant remember the last time" she cried. Patient discussed friends and family who are supportive. Patient reviewed and discussed her homework on boundaries and discussed the things that applied to her the most. Patient reports that she completed some of the homework with her children. Patient discussed her plans for the holidays. Patient discussed her court case and reports that she feels "better" without communication with her husband. Patient reports that the 50B is still in place and she returns to court on February 07, 2014. Patient reports that she now feels that she is ready to move forward with the divorce.  Patient reports that she is more confident and is more aware of the support that she has. Patient reports that she feels that she is back to "the old me" and she is enjoying her time with her sons.  Patient reports that she is out of her Xanax and has contacted the office for a refill without success. Patient denies SI/HI and psychosis. .   Suicidal/Homicidal: Nowithout intent/plan  Therapist Response: LCSW assessed the patient for symptoms and followed up since the last session. LCSW asked the patient about symptoms of depression experienced in the past and compared the current symptoms to those previously. LCSW reviewed and discussed the patients homework on boundaries and asked probing thought provoking questions. LCSW listened to the answers and provided encouraging feedback as  needed. LCSW asked the patient about her court case and assessed the patients safety at this time.  LCSW asked the patient about her family and the status of her divorce. LCSW assigned patient more homework on boundaries to complete for the next session. LCSW contacted front desk regarding the patient refill and was informed that there was a note left for the nurse.     Plan: Return again in 2 weeks, complete assigned homework, take medications as prescribed, utilize mobile crisis in the case of a mental health emergency.   Diagnosis:  Major Depression, single episode, moderate        Farhana Fellows M, LCSW 01/03/2014

## 2014-01-03 NOTE — Telephone Encounter (Signed)
Pt in office to see therapist. Requested refill of Prozac Chart reviewed, last RX 11/23/13 with 1 refill [place] from 11/5 never filled. Placed "on hold" when received. Asked them to fill it now and contact patient

## 2014-01-04 ENCOUNTER — Encounter (HOSPITAL_BASED_OUTPATIENT_CLINIC_OR_DEPARTMENT_OTHER): Payer: Self-pay | Admitting: *Deleted

## 2014-01-04 ENCOUNTER — Emergency Department (HOSPITAL_BASED_OUTPATIENT_CLINIC_OR_DEPARTMENT_OTHER)
Admission: EM | Admit: 2014-01-04 | Discharge: 2014-01-04 | Disposition: A | Discharge status: Home / Self Care | Payer: 59 | Attending: Emergency Medicine | Admitting: Emergency Medicine

## 2014-01-04 ENCOUNTER — Telehealth (HOSPITAL_COMMUNITY): Payer: Self-pay

## 2014-01-04 DIAGNOSIS — Z79899 Other long term (current) drug therapy: Secondary | ICD-10-CM | POA: Insufficient documentation

## 2014-01-04 DIAGNOSIS — Z8739 Personal history of other diseases of the musculoskeletal system and connective tissue: Secondary | ICD-10-CM | POA: Diagnosis not present

## 2014-01-04 DIAGNOSIS — G43909 Migraine, unspecified, not intractable, without status migrainosus: Secondary | ICD-10-CM | POA: Diagnosis not present

## 2014-01-04 DIAGNOSIS — Z87828 Personal history of other (healed) physical injury and trauma: Secondary | ICD-10-CM | POA: Diagnosis not present

## 2014-01-04 DIAGNOSIS — E785 Hyperlipidemia, unspecified: Secondary | ICD-10-CM | POA: Diagnosis not present

## 2014-01-04 DIAGNOSIS — Z794 Long term (current) use of insulin: Secondary | ICD-10-CM | POA: Diagnosis not present

## 2014-01-04 DIAGNOSIS — R51 Headache: Secondary | ICD-10-CM

## 2014-01-04 DIAGNOSIS — F419 Anxiety disorder, unspecified: Secondary | ICD-10-CM | POA: Insufficient documentation

## 2014-01-04 DIAGNOSIS — Z8719 Personal history of other diseases of the digestive system: Secondary | ICD-10-CM | POA: Insufficient documentation

## 2014-01-04 DIAGNOSIS — F329 Major depressive disorder, single episode, unspecified: Secondary | ICD-10-CM | POA: Insufficient documentation

## 2014-01-04 DIAGNOSIS — Z7982 Long term (current) use of aspirin: Secondary | ICD-10-CM | POA: Insufficient documentation

## 2014-01-04 DIAGNOSIS — F411 Generalized anxiety disorder: Secondary | ICD-10-CM

## 2014-01-04 DIAGNOSIS — E1021 Type 1 diabetes mellitus with diabetic nephropathy: Secondary | ICD-10-CM | POA: Insufficient documentation

## 2014-01-04 DIAGNOSIS — Z87442 Personal history of urinary calculi: Secondary | ICD-10-CM | POA: Diagnosis not present

## 2014-01-04 DIAGNOSIS — R519 Headache, unspecified: Secondary | ICD-10-CM

## 2014-01-04 MED ORDER — HYDROXYZINE HCL 25 MG PO TABS: 25.0000 mg | ORAL_TABLET | Freq: Four times a day (QID) | ORAL | Status: DC | PRN

## 2014-01-04 MED ORDER — METOCLOPRAMIDE HCL 10 MG PO TABS: 10.0000 mg | ORAL_TABLET | Freq: Four times a day (QID) | ORAL | Status: DC | PRN

## 2014-01-04 MED ORDER — METOCLOPRAMIDE HCL 10 MG PO TABS
10.0000 mg | ORAL_TABLET | Freq: Once | ORAL | Status: AC
  Administered 2014-01-04: 10 mg via ORAL
  Filled 2014-01-04: qty 1

## 2014-01-04 MED ORDER — PROMETHAZINE HCL 25 MG PO TABS: 25.0000 mg | ORAL_TABLET | Freq: Four times a day (QID) | ORAL | Status: DC | PRN

## 2014-01-04 MED ORDER — PROMETHAZINE HCL 25 MG PO TABS
25.0000 mg | ORAL_TABLET | Freq: Once | ORAL | Status: AC
  Administered 2014-01-04: 25 mg via ORAL
  Filled 2014-01-04: qty 1

## 2014-01-04 NOTE — ED Notes (Signed)
Care assumed

## 2014-01-04 NOTE — ED Provider Notes (Signed)
CSN: [id number]     Arrival date & time 01/04/14  1429 History   First MD Initiated Contact with Patient 01/04/14 1509     Chief Complaint  Patient presents with  . Headache     (Consider location/radiation/quality/duration/timing/severity/associated sxs/prior Treatment) The history is provided by the patient.  DAZHANE VILLAGOMEZ is a 43 y.o. female hx of DM, fibromyalgia, previous head injury, PTSD here with L sided headache. She had head injury 5 years ago and has intermittent headaches. Woke up this morning with left sided headache (site of previous injury). Denies any injuries. Felt tender on the scalp. Denies vomiting. Checked her blood sugar and was around 200 this morning. Was seen in ED several days ago for right sided neck pain that improved with valium, heat pack.    Past Medical History  Diagnosis Date  . 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 Fibromyalgia   . Hyperlipidemia   . Abdominal pain   . Nausea & vomiting   . Biliary dyskinesia   . Depression   . Joint pain   . Skin rash     due to medications  . Post traumatic stress disorder (PTSD)   . Sleep trouble   . Major depressive disorder   . Cervical strain   . Closed head injury 2010  . Renal stones   . Migraine     hx of none recent  . Arthritis   . Hypoglycemia   . Anxiety    Past Surgical History  Procedure Laterality Date  . Lipoma removed  yrs ago  . Knee surgery Left 2012  . Cesarean section  1997, 1999  . Cholecystectomy  02/10/2011    Procedure: LAPAROSCOPIC CHOLECYSTECTOMY WITH INTRAOPERATIVE CHOLANGIOGRAM;  Surgeon: Judieth Keens, DO;  Location: [facility];  Service: General;  Laterality: N/A;  . Robotic assisted laparoscopic lysis of adhesion N/A 03/04/2012    Procedure: ROBOTIC ASSISTED LAPAROSCOPIC LYSIS OF EXTENSIVE ADHESIONS;  Surgeon: Claiborne Billings A. Pamala Hurry, MD;  Location: [facility];  Service: Gynecology;  Laterality: N/A;  . Tonsillectomy  2001  . Abdominal  hysterectomy  2001  . Eye surgery  2006    laser eye surgery left eye  . Exploratory laparomty  Mar 04, 2012  . Laparoscopic appendectomy N/A 10/12/2012    Procedure: DIAGNOSTIC APPENDECTOMY LAPAROSCOPIC;  Surgeon: Madilyn Hook, DO;  Location: [facility];  Service: General;  Laterality: N/A;  . Appendectomy     Family History  Problem Relation Age of Onset  . Cancer Father     lymphoma  . Nephrolithiasis Father   . Vascular Disease Father   . Alcohol abuse Father   . Drug abuse Father   . Cancer Maternal Grandmother     colon  . Hypertension Mother   . Heart disease Mother   . Cataracts Mother   . Depression Mother   . Diabetes Brother   . Drug abuse Brother   . Mental illness Maternal Grandfather   . Cancer Maternal Grandfather   . Heart disease Paternal Grandmother   . Heart disease Paternal Grandfather   . Suicidality Maternal Uncle    History  Substance Use Topics  . Smoking status: Never Smoker   . Smokeless tobacco: Never Used  . Alcohol Use: No   OB History    No data available     Review of Systems  Neurological: Positive for headaches.  All other systems reviewed and are negative.     Allergies  Meloxicam;  Cephalexin; and Ibuprofen  Home Medications   Prior to Admission medications   Medication Sig Start Date End Date Taking? Authorizing Provider  ARIPiprazole (ABILIFY) 5 MG tablet Take 1 tablet (5 mg total) by mouth at bedtime. For mood stabilization 11/23/13   Charlcie Cradle, MD  aspirin EC 81 MG tablet Take 1 tablet (81 mg total) by mouth daily with breakfast. 10/16/13   Janett Labella, NP  atorvastatin (LIPITOR) 40 MG tablet Take 1 tablet (40 mg total) by mouth daily. Resume home meds for high cholesterol 10/16/13   [facility], NP  atorvastatin (LIPITOR) 40 MG tablet Take 1 tablet (40 mg total) by mouth daily. PATIENT NEEDS OFFICE VISIT/LABS FOR ADDITIONAL REFILLS 12/26/13   [name], MD  chlorpheniramine-HYDROcodone [facility]) 10-8 MG/5ML LQCR Take 5 mLs by mouth every 12 (twelve) hours as needed for cough. 11/10/13   Billy Fischer, MD  clonazePAM (KLONOPIN) 0.5 MG tablet Take 1 tablet (0.5 mg total) by mouth 2 (two) times daily. 11/23/13 11/23/14  Charlcie Cradle, MD  diazepam (VALIUM) 5 MG tablet Take 1 tablet (5 mg total) by mouth every 8 (eight) hours as needed for anxiety or muscle spasms. 12/30/13   Garald Balding, NP  FLUoxetine (PROZAC) 20 MG capsule Take 1 capsule (20 mg total) by mouth daily. For depression 11/23/13   Charlcie Cradle, MD  hydrOXYzine (ATARAX/VISTARIL) 25 MG tablet Take 1 tablet (25 mg total) by mouth every 6 (six) hours as needed (Anxiety or Insomnia). For anxiety. 01/04/14   Charlcie Cradle, MD  insulin aspart (NOVOLOG) 100 UNIT/ML injection Inject 8 Units into the skin 3 (three) times daily with meals. 10/16/13   [place], NP  insulin glargine (LANTUS) 100 UNIT/ML injection Inject 30 Units into the skin at bedtime.    Historical Provider, MD  ipratropium (ATROVENT) 0.06 % nasal spray Place 2 sprays into both nostrils 4 (four) times daily. 11/10/13   Billy Fischer, MD  ondansetron (ZOFRAN) 4 MG tablet Take 1 tablet (4 mg total) by mouth every 6 (six) hours. Prn n/v 11/10/13   Billy Fischer, MD   BP 113/57 mmHg  Pulse 77  Temp(Src) 98.4 F (36.9 C) (Oral)  Resp 18  Ht 5\' 4"  (1.626 m)  Wt 159 lb (72.122 kg)  BMI 27.28 kg/m2  SpO2 97% Physical Exam  Constitutional: She is oriented to person, place, and time.  Slightly uncomfortable   HENT:  Head: Normocephalic.  Right Ear: External ear normal.  Left Ear: External ear normal.  Mouth/Throat: Oropharynx is clear and moist.  Slightly tender l scalp but no obvious hematoma. No hemotympanum   Eyes: Conjunctivae are normal. Pupils are equal, round, and reactive to light.  Neck: Normal range of motion.  Cardiovascular: Normal rate, regular rhythm and normal heart sounds.   Pulmonary/Chest: Effort normal and breath sounds  normal. No respiratory distress. She has no wheezes. She has no rales.  Abdominal: Soft. Bowel sounds are normal. She exhibits no distension. There is no tenderness.  Musculoskeletal: Normal range of motion. She exhibits no edema or tenderness.  Neurological: She is alert and oriented to person, place, and time. No cranial nerve deficit. Coordination normal.  CN 2-12 intact. Nl strength throughout. Nl finger to nose. No pronator drift. Nl gait   Skin: Skin is warm and dry.  Psychiatric: She has a normal mood and affect. Her behavior is normal. Judgment and thought content normal.  Nursing note and vitals reviewed.   ED  Course  Procedures (including critical care time) Labs Review Labs Reviewed - No data to display  Imaging Review No results found.   EKG Interpretation None      MDM   Final diagnoses:  None    SHEALA DOSH is a 43 y.o. female here with headaches. No new injuries. Likely migraines. Neuro exam unremarkable, I doubt head bleed. Will treat symptomatically.   4:15 PM Given reglan, phenergan, felt better. Will d/c home.    Wandra Arthurs, MD 01/04/14 [phone]

## 2014-01-04 NOTE — ED Notes (Addendum)
Pt reports previous injury to L parietal area, states blood sugar dropped and she fell onto marble surface several years prior.  Reports concern b/c of previous "blood in the brain", denies had crani, staples only.  No new injury.  Last week with pain in neck area, was seen [facility] for [facility], now with pain at site of previous injury.  Neuro intact.

## 2014-01-04 NOTE — ED Notes (Signed)
MD at bedside. 

## 2014-01-04 NOTE — ED Notes (Signed)
Head pain. Head injury 5 years and it made her concerned that the pain is in the same area.

## 2014-01-04 NOTE — Telephone Encounter (Signed)
Refilled Vistaril #45 tabs

## 2014-01-04 NOTE — Discharge Instructions (Signed)
Take reglan or phenergan for headaches.   Your headache may sometimes get worse since you had injury in that area before.   Follow up with your doctor.   Return to ER if you have severe headaches, vomiting, fevers.

## 2014-01-16 ENCOUNTER — Ambulatory Visit (INDEPENDENT_AMBULATORY_CARE_PROVIDER_SITE_OTHER): Payer: 59 | Admitting: Licensed Clinical Social Worker

## 2014-01-16 DIAGNOSIS — F321 Major depressive disorder, single episode, moderate: Secondary | ICD-10-CM

## 2014-01-16 NOTE — Progress Notes (Signed)
   THERAPIST PROGRESS NOTE  Session Time: 10:05AM-11:05AM  Participation Level: Active  Behavioral Response: CasualAlertGood  Type of Therapy: Individual Therapy  Treatment Goals addressed: Coping and Diagnosis: Major Depression  Interventions: CBT, Motivational Interviewing, Strength-based and Supportive  Summary: NOHEA KRAS is a 43 y.o. female who presents with a history of depression.  Patient discussed her holiday and reported the progress with her husband. Patient reports that the 50B is still in order and her husband spent time with her sons for Christmas. Patient discussed how much relief she has had since the order has been in place and has not experienced depression or anxiety since the order was granted. Patient reports that she "cannot remember" the last time she cried. Patient discussed the ongoing support from her friends and family and reports that she has never been this happy before. Patient discussed her boundaries and how she has had to review her homework and change her thought patterns in reference to relationships with friends and family. Patient reports that she feels that she is much happier now due to putting herself first and taking time and space for herself.  Patient reports that she is happier in her relationships and feels that she is more accountable as well now that she holds her expectations for herself and others.  Patient discussed how she would like to move forward with learning more about boundaries and setting them in place.  Patient was assigned more homework relating to boundaries.  Patient denies SI/HI and psychosis.   Suicidal/Homicidal: Nowithout intent/plan  Therapist Response: LCSW followed up with patient regarding her homework regarding boundaries and her symptoms.  LCSW assessed patient's safety and her comfort in her home with the status of the relationship between her and her husband.  LCSW listened actively and provided encouraging support.   LCSW discussed and reviewed the boundary assignment and listened actively to the patient. LCSW discussed that we would continue to work on boundaries and progress to setting the boundaries, being assertive, and building healthy relationships.   Plan: Return again in 2 weeks.  Diagnosis:  Major Depression, single episode, moderate       Rhina Kramme, [name], LCSW 01/16/2014

## 2014-01-17 ENCOUNTER — Other Ambulatory Visit (HOSPITAL_COMMUNITY): Payer: Self-pay | Admitting: Psychiatry

## 2014-01-17 ENCOUNTER — Other Ambulatory Visit (HOSPITAL_COMMUNITY): Payer: Self-pay | Admitting: *Deleted

## 2014-01-17 NOTE — Telephone Encounter (Signed)
Faxed refill request addressed to [facility], [id number] for Hydroxyzine 25 mg caps,#90, Take 1 TID.Last filled 12/22/13.  Dr. Doyne Keel sent in #45, take 1 Q6H as needed 01/04/14. Contacted [facility]: RX on file and will fill RX from 12/17.

## 2014-01-22 ENCOUNTER — Other Ambulatory Visit: Payer: Self-pay | Admitting: Family Medicine

## 2014-01-23 ENCOUNTER — Encounter (HOSPITAL_COMMUNITY): Payer: Self-pay | Admitting: Psychiatry

## 2014-01-23 ENCOUNTER — Ambulatory Visit (INDEPENDENT_AMBULATORY_CARE_PROVIDER_SITE_OTHER): Payer: 59 | Admitting: Psychiatry

## 2014-01-23 VITALS — BP 143/81 | HR 82 | Ht 64.0 in | Wt 162.0 lb

## 2014-01-23 DIAGNOSIS — F331 Major depressive disorder, recurrent, moderate: Secondary | ICD-10-CM

## 2014-01-23 DIAGNOSIS — F321 Major depressive disorder, single episode, moderate: Secondary | ICD-10-CM

## 2014-01-23 DIAGNOSIS — F411 Generalized anxiety disorder: Secondary | ICD-10-CM

## 2014-01-23 DIAGNOSIS — F4310 Post-traumatic stress disorder, unspecified: Secondary | ICD-10-CM

## 2014-01-23 MED ORDER — ARIPIPRAZOLE 5 MG PO TABS: 5.0000 mg | ORAL_TABLET | Freq: Every day | ORAL | Status: DC

## 2014-01-23 MED ORDER — FLUOXETINE HCL 40 MG PO CAPS: 40.0000 mg | ORAL_CAPSULE | Freq: Every day | ORAL | Status: DC
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
## 2014-01-29 ENCOUNTER — Encounter (HOSPITAL_COMMUNITY): Payer: Self-pay | Admitting: Licensed Clinical Social Worker

## 2014-01-30 ENCOUNTER — Emergency Department (HOSPITAL_COMMUNITY)
Admission: EM | Admit: 2014-01-30 | Discharge: 2014-01-30 | Disposition: A | Discharge status: Home / Self Care | Payer: 59 | Attending: Emergency Medicine | Admitting: Emergency Medicine

## 2014-01-30 ENCOUNTER — Encounter (HOSPITAL_COMMUNITY): Payer: Self-pay | Admitting: Adult Health

## 2014-01-30 ENCOUNTER — Emergency Department (HOSPITAL_COMMUNITY): Payer: 59

## 2014-01-30 DIAGNOSIS — R109 Unspecified abdominal pain: Secondary | ICD-10-CM

## 2014-01-30 DIAGNOSIS — Z9049 Acquired absence of other specified parts of digestive tract: Secondary | ICD-10-CM | POA: Insufficient documentation

## 2014-01-30 DIAGNOSIS — Z7982 Long term (current) use of aspirin: Secondary | ICD-10-CM | POA: Insufficient documentation

## 2014-01-30 DIAGNOSIS — F419 Anxiety disorder, unspecified: Secondary | ICD-10-CM | POA: Insufficient documentation

## 2014-01-30 DIAGNOSIS — R112 Nausea with vomiting, unspecified: Secondary | ICD-10-CM | POA: Insufficient documentation

## 2014-01-30 DIAGNOSIS — Z794 Long term (current) use of insulin: Secondary | ICD-10-CM | POA: Insufficient documentation

## 2014-01-30 DIAGNOSIS — E785 Hyperlipidemia, unspecified: Secondary | ICD-10-CM | POA: Insufficient documentation

## 2014-01-30 DIAGNOSIS — Z8669 Personal history of other diseases of the nervous system and sense organs: Secondary | ICD-10-CM | POA: Insufficient documentation

## 2014-01-30 DIAGNOSIS — G43909 Migraine, unspecified, not intractable, without status migrainosus: Secondary | ICD-10-CM | POA: Insufficient documentation

## 2014-01-30 DIAGNOSIS — Z79899 Other long term (current) drug therapy: Secondary | ICD-10-CM | POA: Insufficient documentation

## 2014-01-30 DIAGNOSIS — R1011 Right upper quadrant pain: Secondary | ICD-10-CM | POA: Insufficient documentation

## 2014-01-30 DIAGNOSIS — F329 Major depressive disorder, single episode, unspecified: Secondary | ICD-10-CM | POA: Insufficient documentation

## 2014-01-30 DIAGNOSIS — M1990 Unspecified osteoarthritis, unspecified site: Secondary | ICD-10-CM | POA: Insufficient documentation

## 2014-01-30 DIAGNOSIS — Z90710 Acquired absence of both cervix and uterus: Secondary | ICD-10-CM | POA: Insufficient documentation

## 2014-01-30 DIAGNOSIS — Z87442 Personal history of urinary calculi: Secondary | ICD-10-CM | POA: Insufficient documentation

## 2014-01-30 DIAGNOSIS — E1021 Type 1 diabetes mellitus with diabetic nephropathy: Secondary | ICD-10-CM | POA: Insufficient documentation

## 2014-01-30 LAB — COMPREHENSIVE METABOLIC PANEL
ALT: 28 U/L (ref 0–35)
AST: 29 U/L (ref 0–37)
Albumin: 3.7 g/dL (ref 3.5–5.2)
Alkaline Phosphatase: 135 U/L — ABNORMAL HIGH (ref 39–117)
Anion gap: 4 — ABNORMAL LOW (ref 5–15)
BUN: 8 mg/dL (ref 6–23)
CO2: 30 mmol/L (ref 19–32)
Calcium: 9.5 mg/dL (ref 8.4–10.5)
Chloride: 103 mEq/L (ref 96–112)
Creatinine, Ser: 1.01 mg/dL (ref 0.50–1.10)
GFR calc Af Amer: 79 mL/min — ABNORMAL LOW (ref >90)
GFR calc non Af Amer: 68 mL/min — ABNORMAL LOW (ref >90)
Glucose, Bld: 106 mg/dL — ABNORMAL HIGH (ref 70–99)
Potassium: 3.9 mmol/L (ref 3.5–5.1)
Sodium: 137 mmol/L (ref 135–145)
Total Bilirubin: 0.9 mg/dL (ref 0.3–1.2)
Total Protein: 7.8 g/dL (ref 6.0–8.3)

## 2014-01-30 LAB — LIPASE, BLOOD: Lipase: 19 U/L (ref 11–59)

## 2014-01-30 LAB — CBC WITH DIFFERENTIAL/PLATELET
Basophils Absolute: 0.1 10*3/uL (ref 0.0–0.1)
Basophils Relative: 1 % (ref 0–1)
Eosinophils Absolute: 0.2 10*3/uL (ref 0.0–0.7)
Eosinophils Relative: 1 % (ref 0–5)
HCT: 41.4 % (ref 36.0–46.0)
Hemoglobin: 14.3 g/dL (ref 12.0–15.0)
Lymphocytes Relative: 20 % (ref 12–46)
Lymphs Abs: 2.7 10*3/uL (ref 0.7–4.0)
MCH: 31.9 pg (ref 26.0–34.0)
MCHC: 34.5 g/dL (ref 30.0–36.0)
MCV: 92.4 fL (ref 78.0–100.0)
Monocytes Absolute: 0.6 10*3/uL (ref 0.1–1.0)
Monocytes Relative: 4 % (ref 3–12)
Neutro Abs: 9.8 10*3/uL — ABNORMAL HIGH (ref 1.7–7.7)
Neutrophils Relative %: 74 % (ref 43–77)
Platelets: 415 10*3/uL — ABNORMAL HIGH (ref 150–400)
RBC: 4.48 MIL/uL (ref 3.87–5.11)
RDW: 13.7 % (ref 11.5–15.5)
WBC: 13.3 10*3/uL — ABNORMAL HIGH (ref 4.0–10.5)

## 2014-01-30 LAB — URINALYSIS, ROUTINE W REFLEX MICROSCOPIC
Bilirubin Urine: NEGATIVE
Glucose, UA: 500 mg/dL — AB
Hgb urine dipstick: NEGATIVE
Ketones, ur: 15 mg/dL — AB
Leukocytes, UA: NEGATIVE
Nitrite: NEGATIVE
Protein, ur: NEGATIVE mg/dL
Specific Gravity, Urine: 1.022 (ref 1.005–1.030)
Urobilinogen, UA: 1 mg/dL (ref 0.0–1.0)
pH: 7.5 (ref 5.0–8.0)

## 2014-01-30 MED ORDER — MORPHINE SULFATE 4 MG/ML IJ SOLN
4.0000 mg | Freq: Once | INTRAMUSCULAR | Status: AC
  Administered 2014-01-30: 4 mg via INTRAVENOUS
  Filled 2014-01-30: qty 1

## 2014-01-30 MED ORDER — HYDROCODONE-ACETAMINOPHEN 5-325 MG PO TABS
1.0000 | ORAL_TABLET | Freq: Once | ORAL | Status: AC
  Administered 2014-01-30: 1 via ORAL
  Filled 2014-01-30: qty 1

## 2014-01-30 MED ORDER — ONDANSETRON 4 MG PO TBDP: 4.0000 mg | ORAL_TABLET | Freq: Three times a day (TID) | ORAL | Status: DC | PRN

## 2014-01-30 MED ORDER — SODIUM CHLORIDE 0.9 % IV BOLUS (SEPSIS)
1000.0000 mL | Freq: Once | INTRAVENOUS | Status: AC
  Administered 2014-01-30: 1000 mL via INTRAVENOUS

## 2014-01-30 MED ORDER — ONDANSETRON HCL 4 MG/2ML IJ SOLN
4.0000 mg | Freq: Once | INTRAMUSCULAR | Status: AC
  Administered 2014-01-30: 4 mg via INTRAVENOUS
  Filled 2014-01-30: qty 2

## 2014-01-30 NOTE — ED Notes (Signed)
Pt A&OX4, ambulatory at d/c with steady gait, NAD 

## 2014-01-30 NOTE — Discharge Instructions (Signed)

## 2014-01-30 NOTE — ED Notes (Signed)
Pt states unable to give urine

## 2014-01-30 NOTE — ED Notes (Signed)
Resident at bedside toperform IV by ultrasound. Pt difficult IV start.

## 2014-01-30 NOTE — ED Notes (Signed)
Presents with upper right quadrant abdominal pain began today associated with nausea. Pain feels like being kicked and comes and goes. denies constipation, diarrhea. Pt reports pain began while lying down,. Normal BM today.  zofran made nausea better.

## 2014-01-30 NOTE — ED Notes (Signed)
Pt pain started Saturday. Denies n/v/diarrhea. Last BM today. States it "was normal"

## 2014-01-30 NOTE — ED Provider Notes (Signed)
CSN: [id number]     Arrival date & time 01/30/14  1533 History   First MD Initiated Contact with Patient 01/30/14 1654     Chief Complaint  Patient presents with  . Abdominal Pain     (Consider location/radiation/quality/duration/timing/severity/associated sxs/prior Treatment) Patient is a 44 y.o. female presenting with abdominal pain.  Abdominal Pain Pain location:  R flank Pain quality: sharp and throbbing   Pain radiates to:  RUQ and epigastric region Pain severity:  Severe Onset quality:  Gradual Duration:  3 days (started Sunday, imoproved then started again today) Timing:  Intermittent Progression:  Waxing and waning Chronicity:  New Context: previous surgery   Associated symptoms: nausea and vomiting (twice)   Associated symptoms: no chest pain, no constipation, no cough, no diarrhea, no dysuria, no fever, no hematemesis, no hematuria, no melena, no shortness of breath, no sore throat, no vaginal bleeding and no vaginal discharge   Risk factors: multiple surgeries (aoppendectomy, cholecystectomy, hysterectomy)   Risk factors: no alcohol abuse and no NSAID use     Past Medical History  Diagnosis Date  . 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 Fibromyalgia   . Hyperlipidemia   . Abdominal pain   . Nausea & vomiting   . Biliary dyskinesia   . Depression   . Joint pain   . Skin rash     due to medications  . Post traumatic stress disorder (PTSD)   . Sleep trouble   . Major depressive disorder   . Cervical strain   . Closed head injury 2010  . Renal stones   . Migraine     hx of none recent  . Arthritis   . Hypoglycemia   . Anxiety    Past Surgical History  Procedure Laterality Date  . Lipoma removed  yrs ago  . Knee surgery Left 2012  . Cesarean section  1997, 1999  . Cholecystectomy  02/10/2011    Procedure: LAPAROSCOPIC CHOLECYSTECTOMY WITH INTRAOPERATIVE CHOLANGIOGRAM;  Surgeon: Judieth Keens, DO;  Location: [facility];   Service: General;  Laterality: N/A;  . Robotic assisted laparoscopic lysis of adhesion N/A 03/04/2012    Procedure: ROBOTIC ASSISTED LAPAROSCOPIC LYSIS OF EXTENSIVE ADHESIONS;  Surgeon: Claiborne Billings A. Pamala Hurry, MD;  Location: [facility];  Service: Gynecology;  Laterality: N/A;  . Tonsillectomy  2001  . Abdominal hysterectomy  2001  . Eye surgery  2006    laser eye surgery left eye  . Exploratory laparomty  Mar 04, 2012  . Laparoscopic appendectomy N/A 10/12/2012    Procedure: DIAGNOSTIC APPENDECTOMY LAPAROSCOPIC;  Surgeon: Madilyn Hook, DO;  Location: [facility];  Service: General;  Laterality: N/A;  . Appendectomy     Family History  Problem Relation Age of Onset  . Cancer Father     lymphoma  . Nephrolithiasis Father   . Vascular Disease Father   . Alcohol abuse Father   . Drug abuse Father   . Cancer Maternal Grandmother     colon  . Hypertension Mother   . Heart disease Mother   . Cataracts Mother   . Depression Mother   . Diabetes Brother   . Drug abuse Brother   . Mental illness Maternal Grandfather   . Cancer Maternal Grandfather   . Heart disease Paternal Grandmother   . Heart disease Paternal Grandfather   . Suicidality Maternal Uncle    History  Substance Use Topics  . Smoking status: Never Smoker   . Smokeless tobacco: Never  Used  . Alcohol Use: No   OB History    No data available     Review of Systems  Constitutional: Negative for fever.  HENT: Negative for sore throat.   Eyes: Negative for visual disturbance.  Respiratory: Negative for cough and shortness of breath.   Cardiovascular: Negative for chest pain.  Gastrointestinal: Positive for nausea, vomiting (twice) and abdominal pain. Negative for diarrhea, constipation, melena and hematemesis.  Genitourinary: Negative for dysuria, hematuria, vaginal bleeding, vaginal discharge and difficulty urinating.  Musculoskeletal: Negative for back pain and neck pain.  Skin: Negative for rash.  Neurological: Negative for  syncope and headaches.      Allergies  Meloxicam; Cephalexin; and Ibuprofen  Home Medications   Prior to Admission medications   Medication Sig Start Date End Date Taking? Authorizing Provider  ARIPiprazole (ABILIFY) 5 MG tablet Take 1 tablet (5 mg total) by mouth at bedtime. For mood stabilization 01/23/14  Yes Charlcie Cradle, MD  aspirin EC 81 MG tablet Take 1 tablet (81 mg total) by mouth daily with breakfast. 10/16/13  Yes Freda Munro May Agustin, NP  atorvastatin (LIPITOR) 40 MG tablet Take 1 tablet (40 mg total) by mouth daily. Resume home meds for high cholesterol 10/16/13  Yes Freda Munro May Agustin, NP  cholecalciferol (VITAMIN D) 1000 UNITS tablet Take 1,000 Units by mouth daily.   Yes Historical Provider, MD  FLUoxetine (PROZAC) 40 MG capsule Take 1 capsule (40 mg total) by mouth daily. For depression 01/23/14  Yes Charlcie Cradle, MD  hydrOXYzine (ATARAX/VISTARIL) 25 MG tablet Take 1 tablet (25 mg total) by mouth every 6 (six) hours as needed (Anxiety or Insomnia). For anxiety. 01/04/14  Yes Charlcie Cradle, MD  insulin aspart (NOVOLOG) 100 UNIT/ML injection Inject 8 Units into the skin 3 (three) times daily with meals. 10/16/13  Yes Freda Munro May Agustin, NP  insulin glargine (LANTUS) 100 UNIT/ML injection Inject 30 Units into the skin at bedtime.   Yes Historical Provider, MD  ipratropium (ATROVENT) 0.06 % nasal spray Place 2 sprays into both nostrils 4 (four) times daily. 11/10/13  Yes Billy Fischer, MD  ondansetron (ZOFRAN) 4 MG tablet Take 1 tablet (4 mg total) by mouth every 6 (six) hours. Prn n/v 11/10/13  Yes Billy Fischer, MD  promethazine (PHENERGAN) 25 MG tablet Take 1 tablet (25 mg total) by mouth every 6 (six) hours as needed for nausea or vomiting (headache). 01/04/14  Yes Wandra Arthurs, MD  metoCLOPramide (REGLAN) 10 MG tablet Take 1 tablet (10 mg total) by mouth every 6 (six) hours as needed for nausea or vomiting (headache). 01/04/14   Wandra Arthurs, MD  ondansetron (ZOFRAN ODT) 4 MG  disintegrating tablet Take 1 tablet (4 mg total) by mouth every 8 (eight) hours as needed for nausea or vomiting. 01/30/14   Gareth Morgan, MD   BP 134/69 mmHg  Pulse 75  Temp(Src) 98.5 F (36.9 C) (Oral)  Resp 16  Ht 5\' 4"  (1.626 m)  Wt 160 lb (72.576 kg)  BMI 27.45 kg/m2  SpO2 100% Physical Exam  Constitutional: She is oriented to person, place, and time. She appears well-developed and well-nourished. No distress.  HENT:  Head: Normocephalic and atraumatic.  Eyes: Conjunctivae and EOM are normal.  Neck: Normal range of motion.  Cardiovascular: Normal rate, regular rhythm, normal heart sounds and intact distal pulses.  Exam reveals no gallop and no friction rub.   No murmur heard. Pulmonary/Chest: Effort normal and breath sounds normal. No respiratory distress. She has  no wheezes. She has no rales.  Abdominal: Soft. She exhibits no distension. There is tenderness in the right upper quadrant. There is CVA tenderness (right). There is no guarding, no tenderness at McBurney's point and negative Murphy's sign.  Musculoskeletal: She exhibits no edema or tenderness.  Neurological: She is alert and oriented to person, place, and time.  Skin: Skin is warm and dry. No rash noted. She is not diaphoretic. No erythema.  Nursing note and vitals reviewed.   ED Course  Procedures (including critical care time) Labs Review Labs Reviewed  COMPREHENSIVE METABOLIC PANEL - Abnormal; Notable for the following:    Glucose, Bld 106 (*)    Alkaline Phosphatase 135 (*)    GFR calc non Af Amer 68 (*)    GFR calc Af Amer 79 (*)    Anion gap 4 (*)    All other components within normal limits  CBC WITH DIFFERENTIAL - Abnormal; Notable for the following:    WBC 13.3 (*)    Platelets 415 (*)    Neutro Abs 9.8 (*)    All other components within normal limits  URINALYSIS, ROUTINE W REFLEX MICROSCOPIC - Abnormal; Notable for the following:    Glucose, UA 500 (*)    Ketones, ur 15 (*)    All other  components within normal limits  URINE CULTURE  LIPASE, BLOOD  CBG MONITORING, ED    Imaging Review Ct Renal Stone Study  01/30/2014   CLINICAL DATA:  RIGHT flank pain for 3 days worsening this morning, history kidney stones, type 1 diabetes  EXAM: CT ABDOMEN AND PELVIS WITHOUT CONTRAST  TECHNIQUE: Multidetector CT imaging of the abdomen and pelvis was performed following the standard protocol without IV contrast. Sagittal and coronal MPR images reconstructed from axial data set.  COMPARISON:  12/27/2012  FINDINGS: Lung bases clear.  Post cholecystectomy.  Kidneys malrotated, hila directed anteriorly.  No definite hydronephrosis, ureteral calcification or dilatation.  Nonobstructing calculus LEFT kidney image 24 unchanged.  Within limits of a nonenhanced exam no other focal abnormalities of the liver, spleen, pancreas, kidneys or adrenal glands.  Appendix surgically absent.  Soft tissue superior to the vagina likely represents residual cervix, patient by history post supracervical hysterectomy.  Neither ovary visualized.  Unremarkable bladder and ureters.  Stomach and bowel unremarkable for exam lacking contrast and adequate bowel distention.  Postsurgical changes at the anterior pelvic wall.  No mass, adenopathy, free fluid or inflammatory process.  Osseous structures unremarkable.  IMPRESSION: No definite acute intra-abdominal or intrapelvic abnormalities identified.  Nonobstructing LEFT renal calculus.   Electronically Signed   By: Lavonia [name] M.D.   On: 01/30/2014 19:06     EKG Interpretation None      MDM   Final diagnoses:  Right flank pain   44 yo female with history of DM Type 1, fibromyalgia, depression, nephrolithiasis, cholecystetomy, appendectomy, hysterectomy presents with concern of right flank pain.  DDx includes DKA, cholangitis, pancreatitis, nephrolithiasis, pyelonephritis, gastritis.   Urinalysis without sign of infection.  CMP without elevation in transaminases or bilirubin,  lipase WNL. No DKA. CBC with mild leukocytosis which has been present on prior CBCs.  CT stone study ordered to evaluate for nephrolithiasis and shows no sign of obstructing right sided nephrolithiasis, however left sided nonobstructing stone.  Discussed all results and thought process with patient.  Patient with possible gastritis or other. Offered protonix or bentyl for pain however pt declined.  Given morphine for pain initially in ED and norco prior to  time of dc.  Given zofran rx for nausea control.  Patient discharged in stable condition with understanding of reasons to return.      Gareth Morgan, MD 01/31/14 [id number]  Ephraim Hamburger, MD 02/02/14 [id number]

## 2014-01-30 NOTE — ED Notes (Signed)
Pt aware of need for urine sample, pt went to restroom, states she vomited and forgot to get a sample.

## 2014-01-31 LAB — CBG MONITORING, ED: Glucose-Capillary: 107 mg/dL — ABNORMAL HIGH (ref 70–99)

## 2014-01-31 LAB — URINE CULTURE: Colony Count: 30000

## 2014-02-01 ENCOUNTER — Emergency Department (HOSPITAL_BASED_OUTPATIENT_CLINIC_OR_DEPARTMENT_OTHER)
Admission: EM | Admit: 2014-02-01 | Discharge: 2014-02-01 | Disposition: A | Discharge status: Home / Self Care | Payer: 59 | Attending: Emergency Medicine | Admitting: Emergency Medicine

## 2014-02-01 ENCOUNTER — Encounter (HOSPITAL_BASED_OUTPATIENT_CLINIC_OR_DEPARTMENT_OTHER): Payer: Self-pay | Admitting: *Deleted

## 2014-02-01 ENCOUNTER — Encounter: Payer: Self-pay | Admitting: Family Medicine

## 2014-02-01 DIAGNOSIS — E785 Hyperlipidemia, unspecified: Secondary | ICD-10-CM | POA: Insufficient documentation

## 2014-02-01 DIAGNOSIS — M1990 Unspecified osteoarthritis, unspecified site: Secondary | ICD-10-CM | POA: Insufficient documentation

## 2014-02-01 DIAGNOSIS — Z90710 Acquired absence of both cervix and uterus: Secondary | ICD-10-CM | POA: Insufficient documentation

## 2014-02-01 DIAGNOSIS — R109 Unspecified abdominal pain: Secondary | ICD-10-CM | POA: Insufficient documentation

## 2014-02-01 DIAGNOSIS — Z8719 Personal history of other diseases of the digestive system: Secondary | ICD-10-CM | POA: Diagnosis not present

## 2014-02-01 DIAGNOSIS — F329 Major depressive disorder, single episode, unspecified: Secondary | ICD-10-CM | POA: Diagnosis not present

## 2014-02-01 DIAGNOSIS — Z79899 Other long term (current) drug therapy: Secondary | ICD-10-CM | POA: Diagnosis not present

## 2014-02-01 DIAGNOSIS — Z7982 Long term (current) use of aspirin: Secondary | ICD-10-CM | POA: Diagnosis not present

## 2014-02-01 DIAGNOSIS — M797 Fibromyalgia: Secondary | ICD-10-CM | POA: Diagnosis not present

## 2014-02-01 DIAGNOSIS — Z9889 Other specified postprocedural states: Secondary | ICD-10-CM | POA: Insufficient documentation

## 2014-02-01 DIAGNOSIS — Z87442 Personal history of urinary calculi: Secondary | ICD-10-CM | POA: Diagnosis not present

## 2014-02-01 DIAGNOSIS — Z9089 Acquired absence of other organs: Secondary | ICD-10-CM | POA: Diagnosis not present

## 2014-02-01 DIAGNOSIS — Z87828 Personal history of other (healed) physical injury and trauma: Secondary | ICD-10-CM | POA: Diagnosis not present

## 2014-02-01 DIAGNOSIS — F419 Anxiety disorder, unspecified: Secondary | ICD-10-CM | POA: Insufficient documentation

## 2014-02-01 DIAGNOSIS — E1021 Type 1 diabetes mellitus with diabetic nephropathy: Secondary | ICD-10-CM | POA: Insufficient documentation

## 2014-02-01 DIAGNOSIS — G43909 Migraine, unspecified, not intractable, without status migrainosus: Secondary | ICD-10-CM | POA: Diagnosis not present

## 2014-02-01 DIAGNOSIS — Z794 Long term (current) use of insulin: Secondary | ICD-10-CM | POA: Diagnosis not present

## 2014-02-01 LAB — URINALYSIS, ROUTINE W REFLEX MICROSCOPIC
Bilirubin Urine: NEGATIVE
Glucose, UA: >1000 mg/dL — AB
Hgb urine dipstick: NEGATIVE
Ketones, ur: NEGATIVE mg/dL
Leukocytes, UA: NEGATIVE
Nitrite: NEGATIVE
Protein, ur: NEGATIVE mg/dL
Specific Gravity, Urine: 1.024 (ref 1.005–1.030)
Urobilinogen, UA: 1 mg/dL (ref 0.0–1.0)
pH: 5.5 (ref 5.0–8.0)

## 2014-02-01 LAB — URINE MICROSCOPIC-ADD ON

## 2014-02-01 MED ORDER — TRAMADOL HCL 50 MG PO TABS
50.0000 mg | ORAL_TABLET | Freq: Once | ORAL | Status: AC
  Administered 2014-02-01: 50 mg via ORAL
  Filled 2014-02-01: qty 1

## 2014-02-01 MED ORDER — HYDROCODONE-ACETAMINOPHEN 5-325 MG PO TABS: 1.0000 | ORAL_TABLET | Freq: Four times a day (QID) | ORAL | Status: DC | PRN

## 2014-02-01 NOTE — ED Provider Notes (Signed)
CSN: [id number]     Arrival date & time 02/01/14  1343 History   First MD Initiated Contact with Patient 02/01/14 1408     Chief Complaint  Patient presents with  . Flank Pain     (Consider location/radiation/quality/duration/timing/severity/associated sxs/prior Treatment) HPI Comments: Patient is a 44 year old female with past medical history of type 1 diabetes, fibromyalgia, depression, PTSD. She presents with complaints of bilateral flank pain. She says she has a history of kidney stones. She was seen 2 days ago at [facility] and underwent a CT scan. This did not reveal any obstructing stones, only a nonobstructing left sided renal calculus. She was told to take Tylenol. She presents here today with complaints of ongoing pain.  Patient is a 44 y.o. female presenting with flank pain. The history is provided by the patient.  Flank Pain This is a recurrent problem. Episode onset: 3 days ago. The problem occurs constantly. The problem has not changed since onset.Pertinent negatives include no abdominal pain. Nothing aggravates the symptoms. Nothing relieves the symptoms. She has tried nothing for the symptoms. The treatment provided no relief.    Past Medical History  Diagnosis Date  . 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 Fibromyalgia   . Hyperlipidemia   . Abdominal pain   . Nausea & vomiting   . Biliary dyskinesia   . Depression   . Joint pain   . Skin rash     due to medications  . Post traumatic stress disorder (PTSD)   . Sleep trouble   . Major depressive disorder   . Cervical strain   . Closed head injury 2010  . Renal stones   . Migraine     hx of none recent  . Arthritis   . Hypoglycemia   . Anxiety    Past Surgical History  Procedure Laterality Date  . Lipoma removed  yrs ago  . Knee surgery Left 2012  . Cesarean section  1997, 1999  . Cholecystectomy  02/10/2011    Procedure: LAPAROSCOPIC CHOLECYSTECTOMY WITH INTRAOPERATIVE  CHOLANGIOGRAM;  Surgeon: Judieth Keens, DO;  Location: [place];  Service: General;  Laterality: N/A;  . Robotic assisted laparoscopic lysis of adhesion N/A 03/04/2012    Procedure: ROBOTIC ASSISTED LAPAROSCOPIC LYSIS OF EXTENSIVE ADHESIONS;  Surgeon: Claiborne Billings A. Pamala Hurry, MD;  Location: [facility];  Service: Gynecology;  Laterality: N/A;  . Tonsillectomy  2001  . Abdominal hysterectomy  2001  . Eye surgery  2006    laser eye surgery left eye  . Exploratory laparomty  Mar 04, 2012  . Laparoscopic appendectomy N/A 10/12/2012    Procedure: DIAGNOSTIC APPENDECTOMY LAPAROSCOPIC;  Surgeon: Madilyn Hook, DO;  Location: [facility];  Service: General;  Laterality: N/A;  . Appendectomy     Family History  Problem Relation Age of Onset  . Cancer Father     lymphoma  . Nephrolithiasis Father   . Vascular Disease Father   . Alcohol abuse Father   . Drug abuse Father   . Cancer Maternal Grandmother     colon  . Hypertension Mother   . Heart disease Mother   . Cataracts Mother   . Depression Mother   . Diabetes Brother   . Drug abuse Brother   . Mental illness Maternal Grandfather   . Cancer Maternal Grandfather   . Heart disease Paternal Grandmother   . Heart disease Paternal Grandfather   . Suicidality Maternal Uncle    History  Substance Use  Topics  . Smoking status: Never Smoker   . Smokeless tobacco: Never Used  . Alcohol Use: No   OB History    No data available     Review of Systems  Gastrointestinal: Negative for abdominal pain.  Genitourinary: Positive for flank pain.  All other systems reviewed and are negative.     Allergies  Meloxicam; Cephalexin; and Ibuprofen  Home Medications   Prior to Admission medications   Medication Sig Start Date End Date Taking? Authorizing Provider  ARIPiprazole (ABILIFY) 5 MG tablet Take 1 tablet (5 mg total) by mouth at bedtime. For mood stabilization 01/23/14   Charlcie Cradle, MD  aspirin EC 81 MG tablet Take 1 tablet (81 mg total) by mouth  daily with breakfast. 10/16/13   Janett Labella, NP  atorvastatin (LIPITOR) 40 MG tablet Take 1 tablet (40 mg total) by mouth daily. Resume home meds for high cholesterol 10/16/13   Janett Labella, NP  cholecalciferol (VITAMIN D) 1000 UNITS tablet Take 1,000 Units by mouth daily.    Historical Provider, MD  FLUoxetine (PROZAC) 40 MG capsule Take 1 capsule (40 mg total) by mouth daily. For depression 01/23/14   Charlcie Cradle, MD  hydrOXYzine (ATARAX/VISTARIL) 25 MG tablet Take 1 tablet (25 mg total) by mouth every 6 (six) hours as needed (Anxiety or Insomnia). For anxiety. 01/04/14   Charlcie Cradle, MD  insulin aspart (NOVOLOG) 100 UNIT/ML injection Inject 8 Units into the skin 3 (three) times daily with meals. 10/16/13   Tega Cay, NP  insulin glargine (LANTUS) 100 UNIT/ML injection Inject 30 Units into the skin at bedtime.    Historical Provider, MD  ipratropium (ATROVENT) 0.06 % nasal spray Place 2 sprays into both nostrils 4 (four) times daily. 11/10/13   Billy Fischer, MD  metoCLOPramide (REGLAN) 10 MG tablet Take 1 tablet (10 mg total) by mouth every 6 (six) hours as needed for nausea or vomiting (headache). 01/04/14   Wandra Arthurs, MD  ondansetron (ZOFRAN ODT) 4 MG disintegrating tablet Take 1 tablet (4 mg total) by mouth every 8 (eight) hours as needed for nausea or vomiting. 01/30/14   Gareth Morgan, MD  ondansetron (ZOFRAN) 4 MG tablet Take 1 tablet (4 mg total) by mouth every 6 (six) hours. Prn n/v 11/10/13   Billy Fischer, MD  promethazine (PHENERGAN) 25 MG tablet Take 1 tablet (25 mg total) by mouth every 6 (six) hours as needed for nausea or vomiting (headache). 01/04/14   Wandra Arthurs, MD   BP 104/56 mmHg  Pulse 73  Temp(Src) 98.5 F (36.9 C) (Oral)  Resp 20  Ht 5\' 4"  (1.626 m)  Wt 160 lb (72.576 kg)  BMI 27.45 kg/m2  SpO2 100% Physical Exam  Constitutional: She is oriented to person, place, and time. She appears well-developed and well-nourished. No distress.  HENT:   Head: Normocephalic and atraumatic.  Neck: Normal range of motion. Neck supple.  Cardiovascular: Normal rate and regular rhythm.  Exam reveals no gallop and no friction rub.   No murmur heard. Pulmonary/Chest: Effort normal and breath sounds normal. No respiratory distress. She has no wheezes.  Abdominal: Soft. Bowel sounds are normal. She exhibits no distension. There is no tenderness.  There is mild tenderness to palpation in both flanks.  Musculoskeletal: Normal range of motion.  Neurological: She is alert and oriented to person, place, and time.  Skin: Skin is warm and dry. She is not diaphoretic.  Nursing note and vitals reviewed.  ED Course  Procedures (including critical care time) Labs Review Labs Reviewed  URINALYSIS, ROUTINE W REFLEX MICROSCOPIC    Imaging Review Ct Renal Stone Study  01/30/2014   CLINICAL DATA:  RIGHT flank pain for 3 days worsening this morning, history kidney stones, type 1 diabetes  EXAM: CT ABDOMEN AND PELVIS WITHOUT CONTRAST  TECHNIQUE: Multidetector CT imaging of the abdomen and pelvis was performed following the standard protocol without IV contrast. Sagittal and coronal MPR images reconstructed from axial data set.  COMPARISON:  12/27/2012  FINDINGS: Lung bases clear.  Post cholecystectomy.  Kidneys malrotated, hila directed anteriorly.  No definite hydronephrosis, ureteral calcification or dilatation.  Nonobstructing calculus LEFT kidney image 24 unchanged.  Within limits of a nonenhanced exam no other focal abnormalities of the liver, spleen, pancreas, kidneys or adrenal glands.  Appendix surgically absent.  Soft tissue superior to the vagina likely represents residual cervix, patient by history post supracervical hysterectomy.  Neither ovary visualized.  Unremarkable bladder and ureters.  Stomach and bowel unremarkable for exam lacking contrast and adequate bowel distention.  Postsurgical changes at the anterior pelvic wall.  No mass, adenopathy, free  fluid or inflammatory process.  Osseous structures unremarkable.  IMPRESSION: No definite acute intra-abdominal or intrapelvic abnormalities identified.  Nonobstructing LEFT renal calculus.   Electronically Signed   By: Lavonia [name] M.D.   On: 01/30/2014 19:06     EKG Interpretation None      MDM   Final diagnoses:  None    Patient presents here with complaints of flank pain. Her urinalysis today is unremarkable for blood or infection. She had a CT scan 2 days ago which did not reveal any obstructing stones and I do not feel as though any repeat imaging is indicated. I will provide her with a small quantity of pain medication which she can take for the next few days. If she is not improving through the weekend, she is to follow-up with her primary Dr. to discuss her situation.    Veryl Speak, MD 02/01/14 [phone]

## 2014-02-01 NOTE — Discharge Instructions (Signed)
Hydrocodone as prescribed as needed for pain.  Follow-up with your primary Dr. if not improving in the next 3 days, and return to the ER if your symptoms substantially worsen or change.   Flank Pain Flank pain refers to pain that is located on the side of the body between the upper abdomen and the back. The pain may occur over a short period of time (acute) or may be long-term or reoccurring (chronic). It may be mild or severe. Flank pain can be caused by many things. CAUSES  Some of the more common causes of flank pain include:  Muscle strains.   Muscle spasms.   A disease of your spine (vertebral disk disease).   A lung infection (pneumonia).   Fluid around your lungs (pulmonary edema).   A kidney infection.   Kidney stones.   A very painful skin rash caused by the chickenpox virus (shingles).   Gallbladder disease.  Lamy care will depend on the cause of your pain. In general,  Rest as directed by your caregiver.  Drink enough fluids to keep your urine clear or pale yellow.  Only take over-the-counter or prescription medicines as directed by your caregiver. Some medicines may help relieve the pain.  Tell your caregiver about any changes in your pain.  Follow up with your caregiver as directed. SEEK IMMEDIATE MEDICAL CARE IF:   Your pain is not controlled with medicine.   You have new or worsening symptoms.  Your pain increases.   You have abdominal pain.   You have shortness of breath.   You have persistent nausea or vomiting.   You have swelling in your abdomen.   You feel faint or pass out.   You have blood in your urine.  You have a fever or persistent symptoms for more than 2-3 days.  You have a fever and your symptoms suddenly get worse. MAKE SURE YOU:   Understand these instructions.  Will watch your condition.  Will get help right away if you are not doing well or get worse. Document Released:  02/26/2005 Document Revised: 09/30/2011 Document Reviewed: 08/20/2011 [facility] Patient Information 2015 [place], [place]. This information is not intended to replace advice given to you by your health care provider. Make sure you discuss any questions you have with your health care provider.

## 2014-02-01 NOTE — ED Notes (Signed)
Left flank pain. States she was diagnosed with a kidney stone at [facility] 2 days ago. Here for pain control.

## 2014-02-02 ENCOUNTER — Ambulatory Visit (HOSPITAL_COMMUNITY): Payer: Self-pay | Admitting: Licensed Clinical Social Worker

## 2014-02-07 ENCOUNTER — Telehealth (HOSPITAL_COMMUNITY): Payer: Self-pay

## 2014-02-07 NOTE — Telephone Encounter (Signed)
Fax received from [facility] requesting a refill of Trazadone 100mg  one at bedtime if needed written by Madison Hickman, NP on 06/13/13 with one refill and last filled 12/12/13.  Not a current medication patient is being prescribed.  Would this be a medication refill you would want to authorize prior to patient's next evaluation scheduled for

## 2014-02-07 NOTE — Telephone Encounter (Signed)
Fax received from [facility] requesting a refill of Trazadone 100mg  one at bedtime if needed written by Madison Hickman, NP on 06/13/13 with one refill and last filled 12/12/13. Not a current medication patient is being prescribed. Would this be a medication refill you would want to authorize prior to patient's next evaluation scheduled for March 2016.

## 2014-02-08 MED ORDER — TRAZODONE HCL 100 MG PO TABS: 100.0000 mg | ORAL_TABLET | Freq: Every day | ORAL | Status: DC

## 2014-02-08 NOTE — Telephone Encounter (Signed)
Yes we can give 30 tabs of Trazodone, no refills.

## 2014-02-08 NOTE — Telephone Encounter (Signed)
Dr. Doyne Keel authorized one refill of Trazadone 100mg  1 at bedtime for insomnia with no refills. E-scribed into [facility]. Attempted to contact patient to let her know prescription was e-scribed to her pharmacy but no answer.

## 2014-02-28 ENCOUNTER — Telehealth (HOSPITAL_COMMUNITY): Payer: Self-pay

## 2014-02-28 DIAGNOSIS — G4700 Insomnia, unspecified: Secondary | ICD-10-CM

## 2014-03-01 MED ORDER — ZOLPIDEM TARTRATE 5 MG PO TABS
5.0000 mg | ORAL_TABLET | Freq: Every evening | ORAL | Status: DC | PRN
Start: 2014-03-01 — End: 2014-03-27

## 2014-03-01 NOTE — Telephone Encounter (Signed)
Called home number. Unable to leave voice message.   Called mobile number- Struggling with sleep and anxiety. State she is sleeping about 2 hrs per night. States Trazodone and Vistaril 75mg  are not effective. Anxiety is contributing to poor sleep. Family tells her she is talking in her sleep. In the past Ambien was effective.  P: 1. D/c Trazodone 2. D/c Vistaril 3. Start trial of Ambien 5mg  po qD #30 no refills called in

## 2014-03-21 IMAGING — CR DG CHEST 2V
2 series · 2 of 2 positions shown · non-contrast
Comparison: 01/17/2012

CLINICAL DATA: Complaint of abdominal pain, chest pain, cough
tonight.

CHEST - 2 VIEW

[w chest pa]
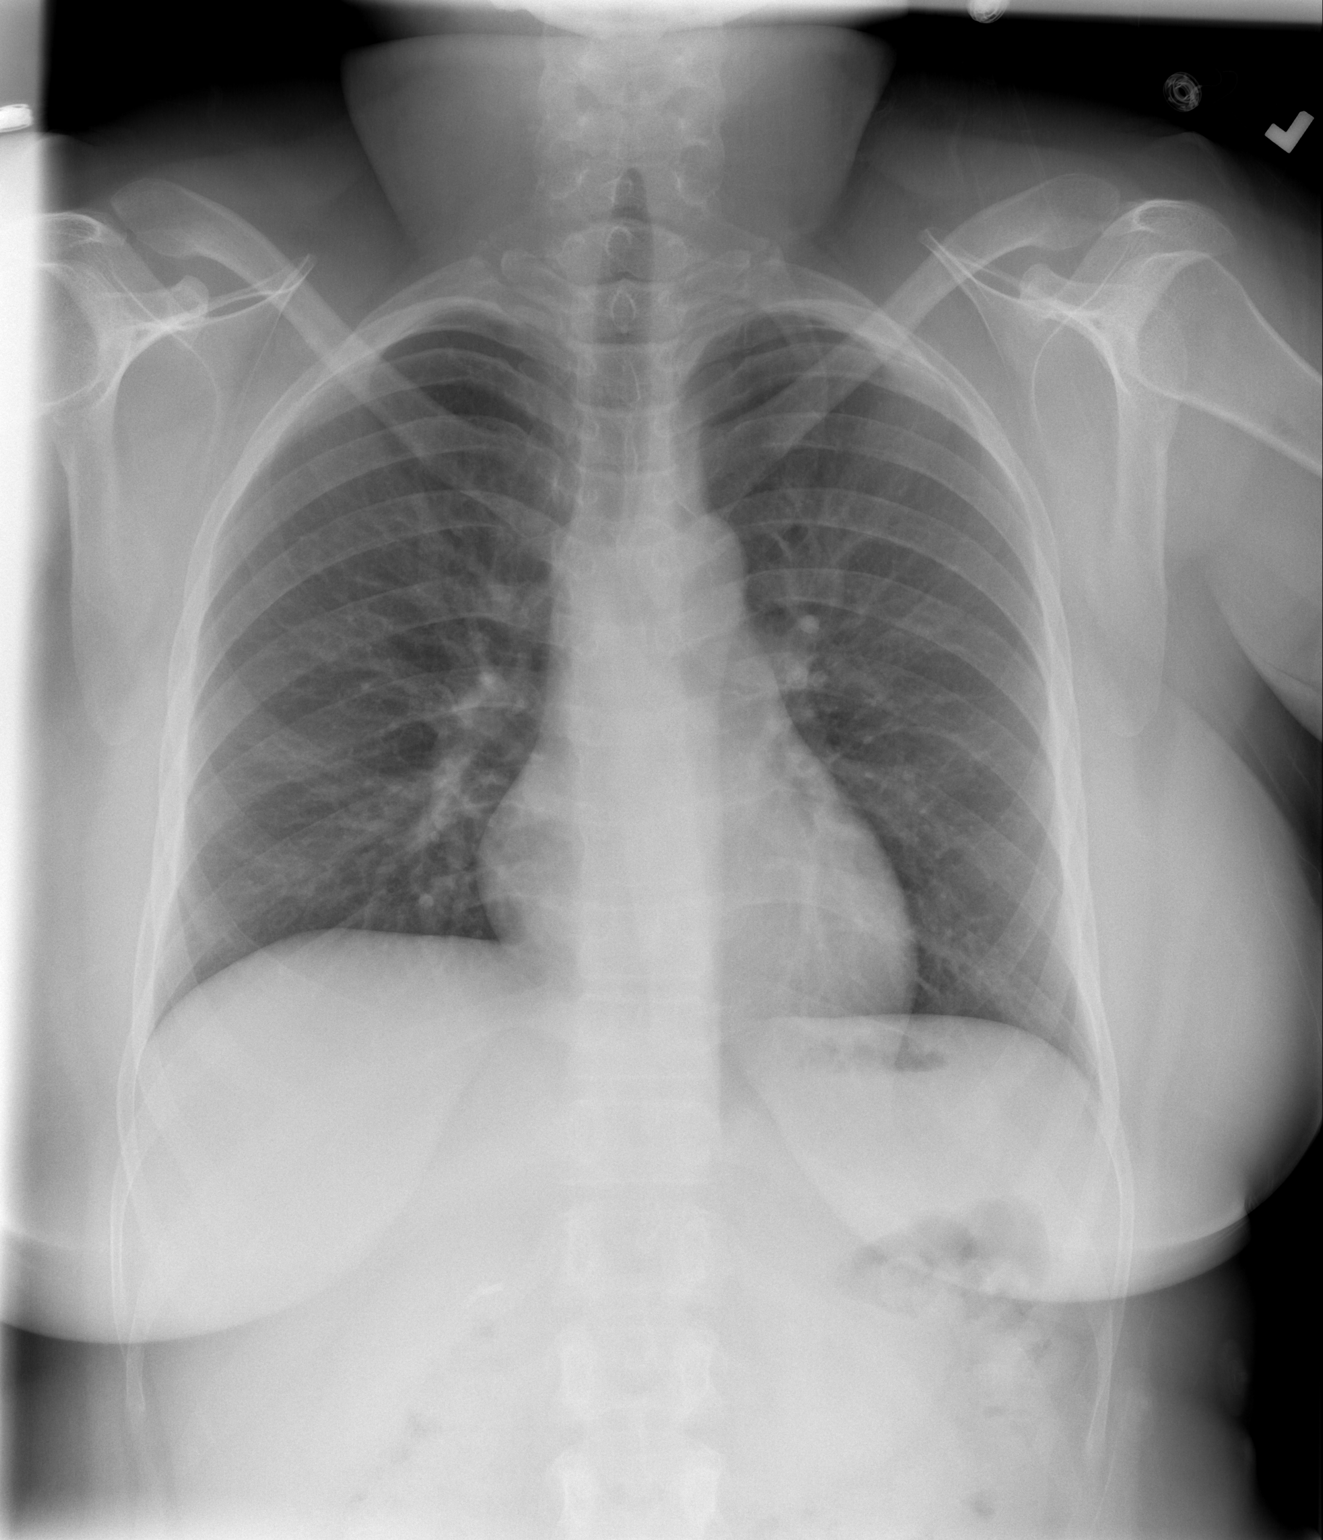

[w chest lat]
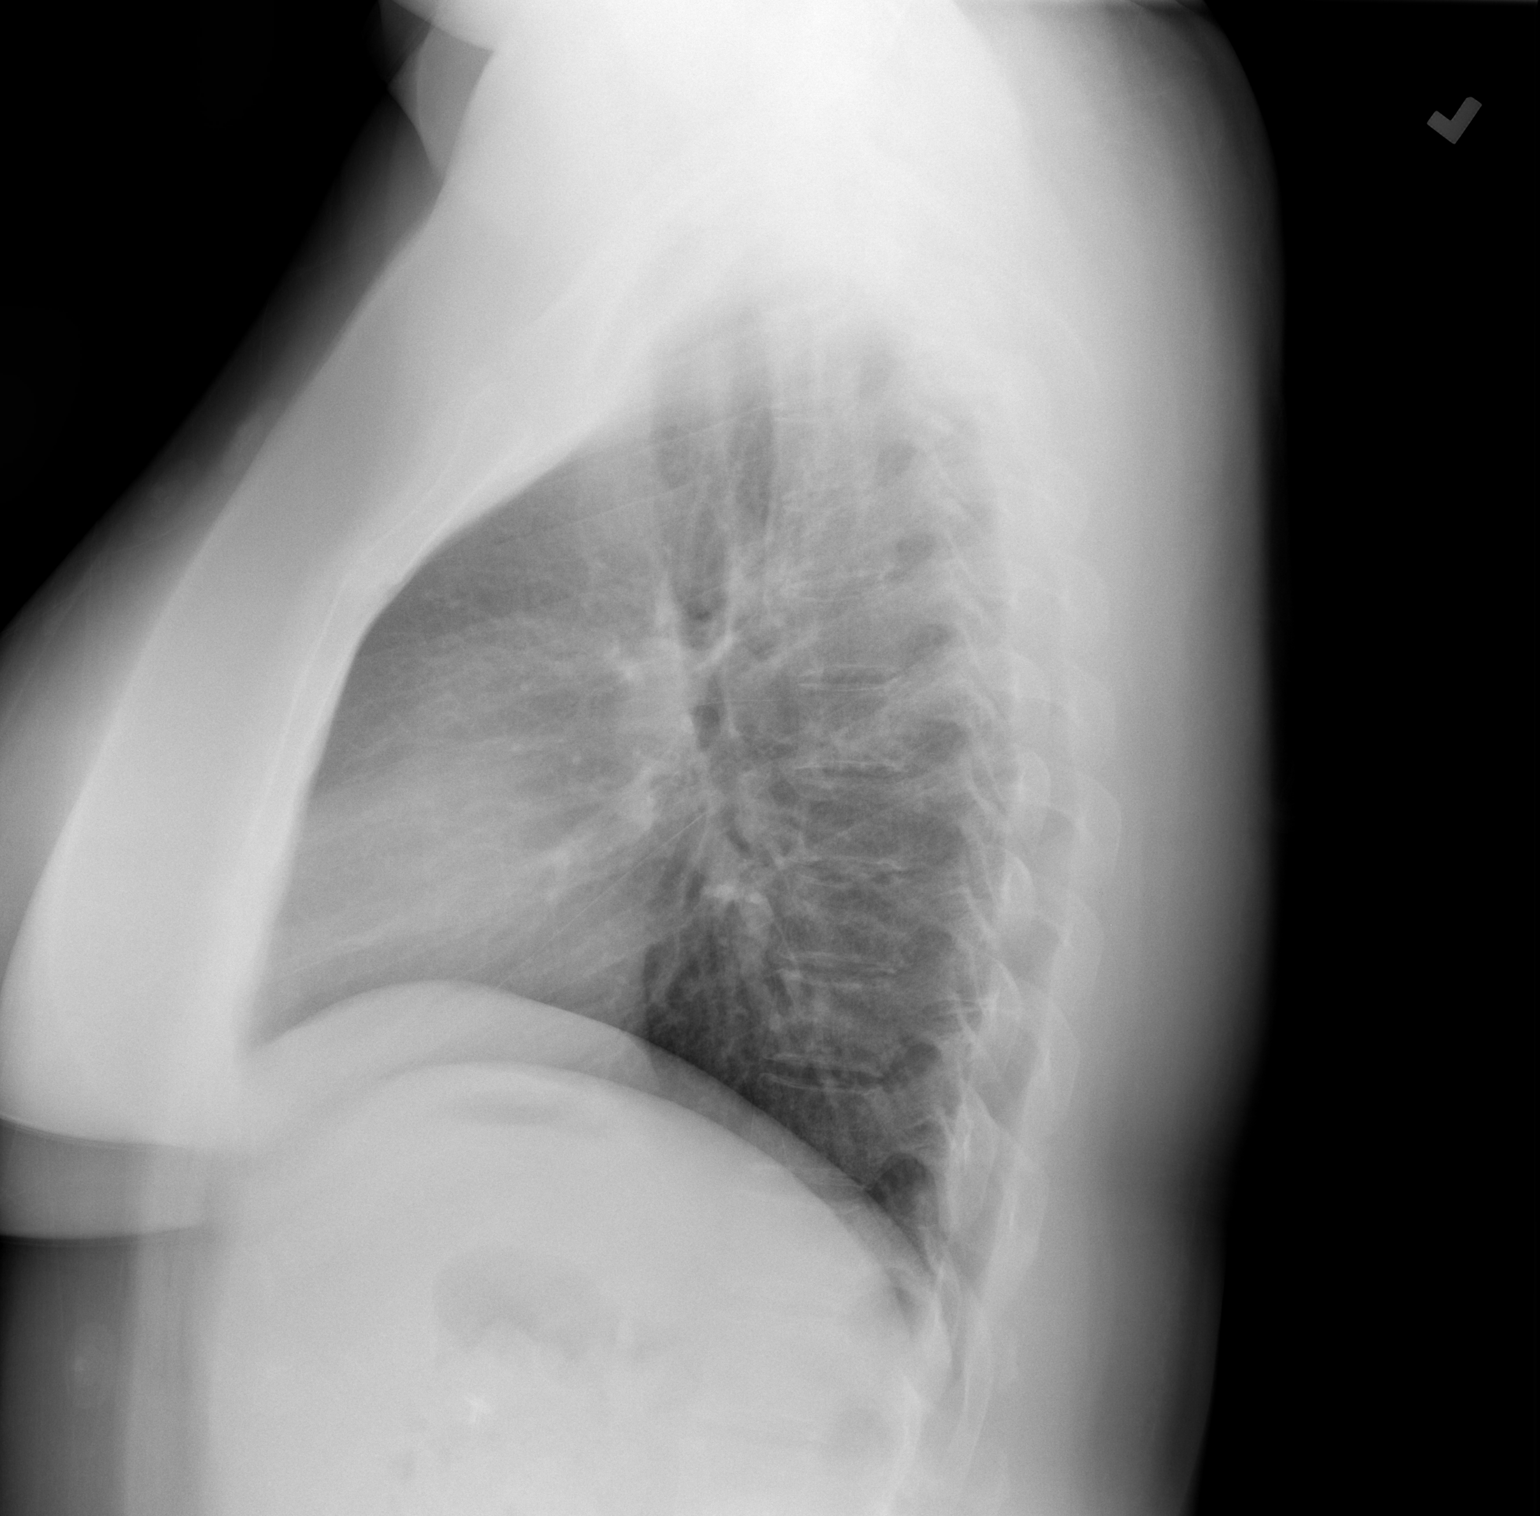

[2 of 2 positions shown; findings below may reference images not displayed]

FINDINGS: The heart size and pulmonary vascularity are normal. The
lungs appear clear and expanded without focal air space disease or
consolidation. No blunting of the costophrenic angles.  No
pneumothorax.  Mediastinal contours appear intact.  Cervical ribs.
Right upper quadrant surgical clips.  No significant change since
previous study.
IMPRESSION: No evidence of active pulmonary disease.

## 2014-03-26 ENCOUNTER — Emergency Department (HOSPITAL_COMMUNITY)
Admission: EM | Admit: 2014-03-26 | Discharge: 2014-03-26 | Disposition: A | Discharge status: Home / Self Care | Payer: BLUE CROSS/BLUE SHIELD | Attending: Emergency Medicine | Admitting: Emergency Medicine

## 2014-03-26 ENCOUNTER — Emergency Department (HOSPITAL_COMMUNITY): Payer: BLUE CROSS/BLUE SHIELD

## 2014-03-26 ENCOUNTER — Encounter (HOSPITAL_COMMUNITY): Payer: Self-pay | Admitting: Emergency Medicine

## 2014-03-26 DIAGNOSIS — Z8679 Personal history of other diseases of the circulatory system: Secondary | ICD-10-CM | POA: Insufficient documentation

## 2014-03-26 DIAGNOSIS — F322 Major depressive disorder, single episode, severe without psychotic features: Secondary | ICD-10-CM | POA: Insufficient documentation

## 2014-03-26 DIAGNOSIS — R079 Chest pain, unspecified: Secondary | ICD-10-CM | POA: Insufficient documentation

## 2014-03-26 DIAGNOSIS — Z7982 Long term (current) use of aspirin: Secondary | ICD-10-CM | POA: Insufficient documentation

## 2014-03-26 DIAGNOSIS — E162 Hypoglycemia, unspecified: Secondary | ICD-10-CM

## 2014-03-26 DIAGNOSIS — Z8719 Personal history of other diseases of the digestive system: Secondary | ICD-10-CM | POA: Insufficient documentation

## 2014-03-26 DIAGNOSIS — M1990 Unspecified osteoarthritis, unspecified site: Secondary | ICD-10-CM | POA: Diagnosis not present

## 2014-03-26 DIAGNOSIS — R0602 Shortness of breath: Secondary | ICD-10-CM | POA: Diagnosis present

## 2014-03-26 DIAGNOSIS — Z79899 Other long term (current) drug therapy: Secondary | ICD-10-CM | POA: Diagnosis not present

## 2014-03-26 DIAGNOSIS — F419 Anxiety disorder, unspecified: Secondary | ICD-10-CM | POA: Diagnosis not present

## 2014-03-26 DIAGNOSIS — Z794 Long term (current) use of insulin: Secondary | ICD-10-CM | POA: Insufficient documentation

## 2014-03-26 DIAGNOSIS — Z87442 Personal history of urinary calculi: Secondary | ICD-10-CM | POA: Diagnosis not present

## 2014-03-26 LAB — I-STAT TROPONIN, ED
Troponin i, poc: 0 ng/mL (ref 0.00–0.08)
Troponin i, poc: 0 ng/mL (ref 0.00–0.08)

## 2014-03-26 LAB — BASIC METABOLIC PANEL
Anion gap: 6 (ref 5–15)
BUN: 12 mg/dL (ref 6–23)
CO2: 28 mmol/L (ref 19–32)
Calcium: 9 mg/dL (ref 8.4–10.5)
Chloride: 105 mmol/L (ref 96–112)
Creatinine, Ser: 0.99 mg/dL (ref 0.50–1.10)
GFR calc Af Amer: 81 mL/min — ABNORMAL LOW (ref >90)
GFR calc non Af Amer: 70 mL/min — ABNORMAL LOW (ref >90)
Glucose, Bld: 39 mg/dL — CL (ref 70–99)
Potassium: 4 mmol/L (ref 3.5–5.1)
Sodium: 139 mmol/L (ref 135–145)

## 2014-03-26 LAB — CBC
HCT: 43.6 % (ref 36.0–46.0)
Hemoglobin: 14.9 g/dL (ref 12.0–15.0)
MCH: 32.9 pg (ref 26.0–34.0)
MCHC: 34.2 g/dL (ref 30.0–36.0)
MCV: 96.2 fL (ref 78.0–100.0)
Platelets: 483 10*3/uL — ABNORMAL HIGH (ref 150–400)
RBC: 4.53 MIL/uL (ref 3.87–5.11)
RDW: 14.7 % (ref 11.5–15.5)
WBC: 12.7 10*3/uL — ABNORMAL HIGH (ref 4.0–10.5)

## 2014-03-26 LAB — CBG MONITORING, ED
Glucose-Capillary: 53 mg/dL — ABNORMAL LOW (ref 70–99)
Glucose-Capillary: 75 mg/dL (ref 70–99)

## 2014-03-26 LAB — BRAIN NATRIURETIC PEPTIDE: B Natriuretic Peptide: 27.9 pg/mL (ref 0.0–100.0)

## 2014-03-26 MED ORDER — ACETAMINOPHEN 325 MG PO TABS
650.0000 mg | ORAL_TABLET | Freq: Once | ORAL | Status: AC
  Administered 2014-03-26: 650 mg via ORAL
  Filled 2014-03-26: qty 2

## 2014-03-26 MED ORDER — ONDANSETRON 4 MG PO TBDP
4.0000 mg | ORAL_TABLET | Freq: Once | ORAL | Status: AC
  Administered 2014-03-26: 4 mg via ORAL
  Filled 2014-03-26: qty 1

## 2014-03-26 NOTE — ED Notes (Signed)
Per pt, states SOB, chest pain on and off for a week-states CBGs have been normal

## 2014-03-26 NOTE — ED Provider Notes (Signed)
CSN: [id number]     Arrival date & time 03/26/14  1452 History   First MD Initiated Contact with Patient 03/26/14 [phone]     Chief Complaint  Patient presents with  . Shortness of Breath     (Consider location/radiation/quality/duration/timing/severity/associated sxs/prior Treatment) HPI  IVETT [name] is a 44 y.o. female with PMH of hypertension, hyperlipidemia, fibromyalgia, PTSD, depression, major depressive disorder, anxiety presenting with intermittent pressure-like chest pain in central chest as well as shortness of breath for the past week. Patient states this happens before bed and she ruminates. Last for about 30 minutes. Nothing makes it better and  makes it worse. She has not taken anything for it. Patient notes associated numbness and tingling in bilateral fingertips as well as nausea but no vomiting or diaphoresis. Patient has had similar symptoms before last year when she had to be admitted to behavioral health for anxiety. She denies smoking history or heart failure. Pt denies history of DVT, PE, recent surgery or trauma, malignancy, hemoptysis, exogenous estrogen use, unilateral leg swelling or tenderness, immobilization.   Past Medical History  Diagnosis Date  . 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 Fibromyalgia   . Hyperlipidemia   . Abdominal pain   . Nausea & vomiting   . Biliary dyskinesia   . Depression   . Joint pain   . Skin rash     due to medications  . Post traumatic stress disorder (PTSD)   . Sleep trouble   . Major depressive disorder   . Cervical strain   . Closed head injury 2010  . Renal stones   . Migraine     hx of none recent  . Arthritis   . Hypoglycemia   . Anxiety    Past Surgical History  Procedure Laterality Date  . Lipoma removed  yrs ago  . Knee surgery Left 2012  . Cesarean section  1997, 1999  . Cholecystectomy  02/10/2011    Procedure: LAPAROSCOPIC CHOLECYSTECTOMY WITH INTRAOPERATIVE  CHOLANGIOGRAM;  Surgeon: Judieth Keens, DO;  Location: [place];  Service: General;  Laterality: N/A;  . Robotic assisted laparoscopic lysis of adhesion N/A 03/04/2012    Procedure: ROBOTIC ASSISTED LAPAROSCOPIC LYSIS OF EXTENSIVE ADHESIONS;  Surgeon: Claiborne Billings A. Pamala Hurry, MD;  Location: [facility];  Service: Gynecology;  Laterality: N/A;  . Tonsillectomy  2001  . Abdominal hysterectomy  2001  . Eye surgery  2006    laser eye surgery left eye  . Exploratory laparomty  Mar 04, 2012  . Laparoscopic appendectomy N/A 10/12/2012    Procedure: DIAGNOSTIC APPENDECTOMY LAPAROSCOPIC;  Surgeon: Madilyn Hook, DO;  Location: [facility];  Service: General;  Laterality: N/A;  . Appendectomy     Family History  Problem Relation Age of Onset  . Cancer Father     lymphoma  . Nephrolithiasis Father   . Vascular Disease Father   . Alcohol abuse Father   . Drug abuse Father   . Cancer Maternal Grandmother     colon  . Hypertension Mother   . Heart disease Mother   . Cataracts Mother   . Depression Mother   . Diabetes Brother   . Drug abuse Brother   . Mental illness Maternal Grandfather   . Cancer Maternal Grandfather   . Heart disease Paternal Grandmother   . Heart disease Paternal Grandfather   . Suicidality Maternal Uncle    History  Substance Use Topics  . Smoking status: Never Smoker   .  Smokeless tobacco: Never Used  . Alcohol Use: No   OB History    No data available     Review of Systems 10 Systems reviewed and are negative for acute change except as noted in the HPI.    Allergies  Meloxicam; Cephalexin; and Ibuprofen  Home Medications   Prior to Admission medications   Medication Sig Start Date End Date Taking? Authorizing Provider  acetaminophen (TYLENOL) 325 MG tablet Take 650 mg by mouth every 6 (six) hours as needed for mild pain or headache.   Yes Historical Provider, MD  aspirin EC 81 MG tablet Take 1 tablet (81 mg total) by mouth daily with breakfast. 10/16/13  Yes Kerrie Buffalo, NP  atorvastatin (LIPITOR) 40 MG tablet Take 1 tablet (40 mg total) by mouth daily. Resume home meds for high cholesterol 10/16/13  Yes Kerrie Buffalo, NP  cholecalciferol (VITAMIN D) 1000 UNITS tablet Take 1,000 Units by mouth daily.   Yes Historical Provider, MD  cyclobenzaprine (FLEXERIL) 5 MG tablet Take 5 mg by mouth daily as needed. 02/06/14  Yes Historical Provider, MD  FLUoxetine (PROZAC) 40 MG capsule Take 1 capsule (40 mg total) by mouth daily. For depression 01/23/14  Yes Charlcie Cradle, MD  insulin aspart (NOVOLOG) 100 UNIT/ML injection Inject 8 Units into the skin 3 (three) times daily with meals. 10/16/13  Yes Kerrie Buffalo, NP  insulin glargine (LANTUS) 100 UNIT/ML injection Inject 30 Units into the skin at bedtime.   Yes Historical Provider, MD  Lido-Capsaicin-Men-Methyl Sal (MEDI-PATCH-LIDOCAINE EX) Apply 1 patch topically daily.   Yes Historical Provider, MD  MELATONIN PO Take 1 tablet by mouth at bedtime.   Yes Historical Provider, MD  Oxycodone HCl 10 MG TABS Take 10 mg by mouth 3 (three) times daily with meals as needed.  03/08/14  Yes Historical Provider, MD  zolpidem (AMBIEN) 5 MG tablet Take 1 tablet (5 mg total) by mouth at bedtime as needed for sleep. 03/01/14  Yes Charlcie Cradle, MD  ARIPiprazole (ABILIFY) 5 MG tablet Take 1 tablet (5 mg total) by mouth at bedtime. For mood stabilization 01/23/14   Charlcie Cradle, MD  HYDROcodone-acetaminophen [facility]) 5-325 MG per tablet Take 1-2 tablets by mouth every 6 (six) hours as needed. 02/01/14   Veryl Speak, MD  ipratropium (ATROVENT) 0.06 % nasal spray Place 2 sprays into both nostrils 4 (four) times daily. 11/10/13   Billy Fischer, MD  metoCLOPramide (REGLAN) 10 MG tablet Take 1 tablet (10 mg total) by mouth every 6 (six) hours as needed for nausea or vomiting (headache). 01/04/14   Wandra Arthurs, MD  ondansetron (ZOFRAN ODT) 4 MG disintegrating tablet Take 1 tablet (4 mg total) by mouth every 8 (eight) hours as needed for nausea  or vomiting. 01/30/14   Gareth Morgan, MD  ondansetron (ZOFRAN) 4 MG tablet Take 1 tablet (4 mg total) by mouth every 6 (six) hours. Prn n/v 11/10/13   Billy Fischer, MD  promethazine (PHENERGAN) 25 MG tablet Take 1 tablet (25 mg total) by mouth every 6 (six) hours as needed for nausea or vomiting (headache). 01/04/14   Wandra Arthurs, MD   BP 147/78 mmHg  Pulse 75  Temp(Src) 98.1 F (36.7 C) (Oral)  Resp 16  SpO2 99% Physical Exam  Constitutional: She appears well-developed and well-nourished. No distress.  HENT:  Head: Normocephalic and atraumatic.  Eyes: Conjunctivae and EOM are normal. Right eye exhibits no discharge. Left eye exhibits no discharge.  Neck: No JVD present.  Cardiovascular: Normal rate and regular rhythm.   No leg swelling or tenderness. Negative Homan's sign.  Pulmonary/Chest: Effort normal and breath sounds normal. No respiratory distress. She has no wheezes.  Abdominal: Soft. Bowel sounds are normal. She exhibits no distension. There is no tenderness.  Neurological: She is alert. She exhibits normal muscle tone. Coordination normal.  Skin: Skin is warm and dry. She is not diaphoretic.  Nursing note and vitals reviewed.   ED Course  Procedures (including critical care time) Labs Review Labs Reviewed  CBC - Abnormal; Notable for the following:    WBC 12.7 (*)    Platelets 483 (*)    All other components within normal limits  BASIC METABOLIC PANEL - Abnormal; Notable for the following:    Glucose, Bld 39 (*)    GFR calc non Af Amer 70 (*)    GFR calc Af Amer 81 (*)    All other components within normal limits  CBG MONITORING, ED - Abnormal; Notable for the following:    Glucose-Capillary 53 (*)    All other components within normal limits  BRAIN NATRIURETIC PEPTIDE  I-STAT TROPOININ, ED  CBG MONITORING, ED  Randolm Idol, ED    Imaging Review Dg Chest 2 View  03/26/2014   CLINICAL DATA:  Mid chest pain and dizziness. Shortness of breath. Duration: 3  days.  EXAM: CHEST  2 VIEW  COMPARISON:  Multiple exams, including 07/18/2013  FINDINGS: The heart size and mediastinal contours are within normal limits. Both lungs are clear. The visualized skeletal structures are unremarkable.  IMPRESSION: No active cardiopulmonary disease.   Electronically Signed   By: Van Clines M.D.   On: 03/26/2014 15:42     EKG Interpretation   Date/Time:  Monday March 26 2014 15:11:02 EST Ventricular Rate:  92 PR Interval:  123 QRS Duration: 73 QT Interval:  371 QTC Calculation: 459 R Axis:   94 Text Interpretation:  Sinus rhythm Probable left atrial enlargement  Borderline right axis deviation Borderline T abnormalities, inferior leads  Baseline wander in lead(s) II III aVR aVF V1 V4 V5 V6 No significant  change since last tracing Confirmed by Winfred Leeds  MD, SAM [phone]) on  03/26/2014 3:24:38 PM      MDM   Final diagnoses:  SOB (shortness of breath)  Chest pain, unspecified chest pain type  Anxiety  Hypoglycemia   Chest pain is not likely of cardiac or pulmonary etiology d/t presentation, PERC negative, low risk HEART score. VSS, no JVD or new murmur, RRR, breath sounds equal bilaterally, EKG without acute abnormalities, negative troponin and delta troponin, and negative CXR. Pt also states SOB occurs with rumination. I think anxiety is playing a role. Pt with behavior health appointment tomorrow. Pt has been advised to return to the ED if CP becomes exertional, associated with diaphoresis or nausea, radiates to left jaw/arm, worsens or becomes concerning in any way. Pt with hypoglycemia to 39 in ED. Pt developed nausea without vomiting. Pt given food and CBG 75. Pt stable without any complaints at this time. VSS. Pt to follow up with PCP.  Pt appears reliable for follow up and is agreeable to discharge.   Discussed return precautions with patient. Discussed all results and patient verbalizes understanding and agrees with plan.  Case has been  discussed with Dr. Tomi Bamberger who agrees with the above plan and to discharge.     Al Corpus, PA-C 03/26/14 [place], MD 03/27/14 [phone]

## 2014-03-26 NOTE — Discharge Instructions (Signed)
Return to the emergency room with worsening of symptoms, new symptoms or with symptoms that are concerning , especially chest pain that feels like a pressure, spreads to left arm or jaw, worse with exertion, associated with nausea, vomiting, shortness of breath and/or sweating.  Call to make a follow-up appointment with your primary care provider in 2 days. Follow-up with behavioral health as scheduled tomorrow. Read below information and follow recommendations.   Chest Pain (Nonspecific) It is often hard to give a specific diagnosis for the cause of chest pain. There is always a chance that your pain could be related to something serious, such as a heart attack or a blood clot in the lungs. You need to follow up with your health care provider for further evaluation. CAUSES   Heartburn.  Pneumonia or bronchitis.  Anxiety or stress.  Inflammation around your heart (pericarditis) or lung (pleuritis or pleurisy).  A blood clot in the lung.  A collapsed lung (pneumothorax). It can develop suddenly on its own (spontaneous pneumothorax) or from trauma to the chest.  Shingles infection (herpes zoster virus). The chest wall is composed of bones, muscles, and cartilage. Any of these can be the source of the pain.  The bones can be bruised by injury.  The muscles or cartilage can be strained by coughing or overwork.  The cartilage can be affected by inflammation and become sore (costochondritis). DIAGNOSIS  Lab tests or other studies may be needed to find the cause of your pain. Your health care provider may have you take a test called an ambulatory electrocardiogram (ECG). An ECG records your heartbeat patterns over a 24-hour period. You may also have other tests, such as:  Transthoracic echocardiogram (TTE). During echocardiography, sound waves are used to evaluate how blood flows through your heart.  Transesophageal echocardiogram (TEE).  Cardiac monitoring. This allows your health care  provider to monitor your heart rate and rhythm in real time.  Holter monitor. This is a portable device that records your heartbeat and can help diagnose heart arrhythmias. It allows your health care provider to track your heart activity for several days, if needed.  Stress tests by exercise or by giving medicine that makes the heart beat faster. TREATMENT   Treatment depends on what may be causing your chest pain. Treatment may include:  Acid blockers for heartburn.  Anti-inflammatory medicine.  Pain medicine for inflammatory conditions.  Antibiotics if an infection is present.  You may be advised to change lifestyle habits. This includes stopping smoking and avoiding alcohol, caffeine, and chocolate.  You may be advised to keep your head raised (elevated) when sleeping. This reduces the chance of acid going backward from your stomach into your esophagus. Most of the time, nonspecific chest pain will improve within 2-3 days with rest and mild pain medicine.  HOME CARE INSTRUCTIONS   If antibiotics were prescribed, take them as directed. Finish them even if you start to feel better.  For the next few days, avoid physical activities that bring on chest pain. Continue physical activities as directed.  Do not use any tobacco products, including cigarettes, chewing tobacco, or electronic cigarettes.  Avoid drinking alcohol.  Only take medicine as directed by your health care provider.  Follow your health care provider's suggestions for further testing if your chest pain does not go away.  Keep any follow-up appointments you made. If you do not go to an appointment, you could develop lasting (chronic) problems with pain. If there is any problem keeping an  appointment, call to reschedule. SEEK MEDICAL CARE IF:   Your chest pain does not go away, even after treatment.  You have a rash with blisters on your chest.  You have a fever. SEEK IMMEDIATE MEDICAL CARE IF:   You have  increased chest pain or pain that spreads to your arm, neck, jaw, back, or abdomen.  You have shortness of breath.  You have an increasing cough, or you cough up blood.  You have severe back or abdominal pain.  You feel nauseous or vomit.  You have severe weakness.  You faint.  You have chills. This is an emergency. Do not wait to see if the pain will go away. Get medical help at once. Call your local emergency services (911 in [place] Do not drive yourself to the hospital. MAKE SURE YOU:   Understand these instructions.  Will watch your condition.  Will get help right away if you are not doing well or get worse. Document Released: 10/15/2004 Document Revised: 01/10/2013 Document Reviewed: 08/11/2007 [facility] Patient Information 2015 [place], [place]. This information is not intended to replace advice given to you by your health care provider. Make sure you discuss any questions you have with your health care provider.  Generalized Anxiety Disorder Generalized anxiety disorder (GAD) is a mental disorder. It interferes with life functions, including relationships, work, and school. GAD is different from normal anxiety, which everyone experiences at some point in their lives in response to specific life events and activities. Normal anxiety actually helps [place] prepare for and get through these life events and activities. Normal anxiety goes away after the event or activity is over.  GAD causes anxiety that is not necessarily related to specific events or activities. It also causes excess anxiety in proportion to specific events or activities. The anxiety associated with GAD is also difficult to control. GAD can vary from mild to severe. People with severe GAD can have intense waves of anxiety with physical symptoms (panic attacks).  SYMPTOMS The anxiety and worry associated with GAD are difficult to control. This anxiety and worry are related to many life events and activities and also occur  more days than not for 6 months or longer. People with GAD also have three or more of the following symptoms (one or more in children):  Restlessness.   Fatigue.  Difficulty concentrating.   Irritability.  Muscle tension.  Difficulty sleeping or unsatisfying sleep. DIAGNOSIS GAD is diagnosed through an assessment by your health care provider. Your health care provider will ask you questions aboutyour mood,physical symptoms, and events in your life. Your health care provider may ask you about your medical history and use of alcohol or drugs, including prescription medicines. Your health care provider may also do a physical exam and blood tests. Certain medical conditions and the use of certain substances can cause symptoms similar to those associated with GAD. Your health care provider may refer you to a mental health specialist for further evaluation. TREATMENT The following therapies are usually used to treat GAD:   Medication. Antidepressant medication usually is prescribed for long-term daily control. Antianxiety medicines may be added in severe cases, especially when panic attacks occur.   Talk therapy (psychotherapy). Certain types of talk therapy can be helpful in treating GAD by providing support, education, and guidance. A form of talk therapy called cognitive behavioral therapy can teach you healthy ways to think about and react to daily life events and activities.  Stress managementtechniques. These include yoga, meditation, and exercise and  can be very helpful when they are practiced regularly. A mental health specialist can help determine which treatment is best for you. Some people see improvement with one therapy. However, other people require a combination of therapies. Document Released: 05/02/2012 Document Revised: 05/22/2013 Document Reviewed: 05/02/2012 [facility] Patient Information 2015 [place], [place]. This information is not intended to replace advice given to you  by your health care provider. Make sure you discuss any questions you have with your health care provider.

## 2014-03-26 NOTE — ED Notes (Signed)
Pt given orange juice.

## 2014-03-26 NOTE — ED Notes (Signed)
Pt given apple juice  

## 2014-03-26 NOTE — Progress Notes (Signed)
EDCM spoke to patient at bedside.   Patient noted to have been seen in the ED 9 times within the last six months.  Patient confirms she has [employer] insurance with pcp Dr. Federico Flake.  Patient reports the last time she was seen by her pcp was in Nov or Dec.  Patient reports her abdominal pain in unbearable at times.  Patient reports she has been given a "lidocaine" patch to wear for her pain.  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  Patient states she has been referred to a GI specialist but, "There's nothing they can do for me."  Patient reports she has been a patient at [facility] twice due to anxiety brought in by unbearable pain.  Patient is seen by Dr. Greta Doom of pain management and takes oxycodone for her pain at home if it is unbearable.  Patient reports she tries not to take pain medicine unless she absolutely has too.  Patient reports she is compliant with her diabetes and sees her endocrinologist. Patient reports she is still seeing her [facility] at [facility].  Patient reports she plans on following up with her pcp after this ED visit.  Patient without questions at this time.  No further EDCM needs at this time.

## 2014-03-27 ENCOUNTER — Ambulatory Visit (INDEPENDENT_AMBULATORY_CARE_PROVIDER_SITE_OTHER): Payer: BLUE CROSS/BLUE SHIELD | Admitting: Psychiatry

## 2014-03-27 ENCOUNTER — Encounter (HOSPITAL_COMMUNITY): Payer: Self-pay | Admitting: Psychiatry

## 2014-03-27 VITALS — BP 123/69 | HR 92 | Ht 64.0 in | Wt 158.0 lb

## 2014-03-27 DIAGNOSIS — F331 Major depressive disorder, recurrent, moderate: Secondary | ICD-10-CM | POA: Diagnosis not present

## 2014-03-27 DIAGNOSIS — F4310 Post-traumatic stress disorder, unspecified: Secondary | ICD-10-CM

## 2014-03-27 DIAGNOSIS — F411 Generalized anxiety disorder: Secondary | ICD-10-CM

## 2014-03-27 MED ORDER — FLUOXETINE HCL 40 MG PO CAPS: 40.0000 mg | ORAL_CAPSULE | Freq: Every day | ORAL | Status: DC

## 2014-03-27 MED ORDER — QUETIAPINE FUMARATE 50 MG PO TABS: 50.0000 mg | ORAL_TABLET | Freq: Every day | ORAL | Status: DC
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
## 2014-03-29 IMAGING — CR DG ABDOMEN 2V
3 series · 3 of 3 positions shown · non-contrast
Comparison: CT abdomen and pelvis 01/18/2012 and earlier.

CLINICAL DATA: 41-year-old female with increasing right flank pain.

ABDOMEN - 2 VIEW

[w abdomen upright]
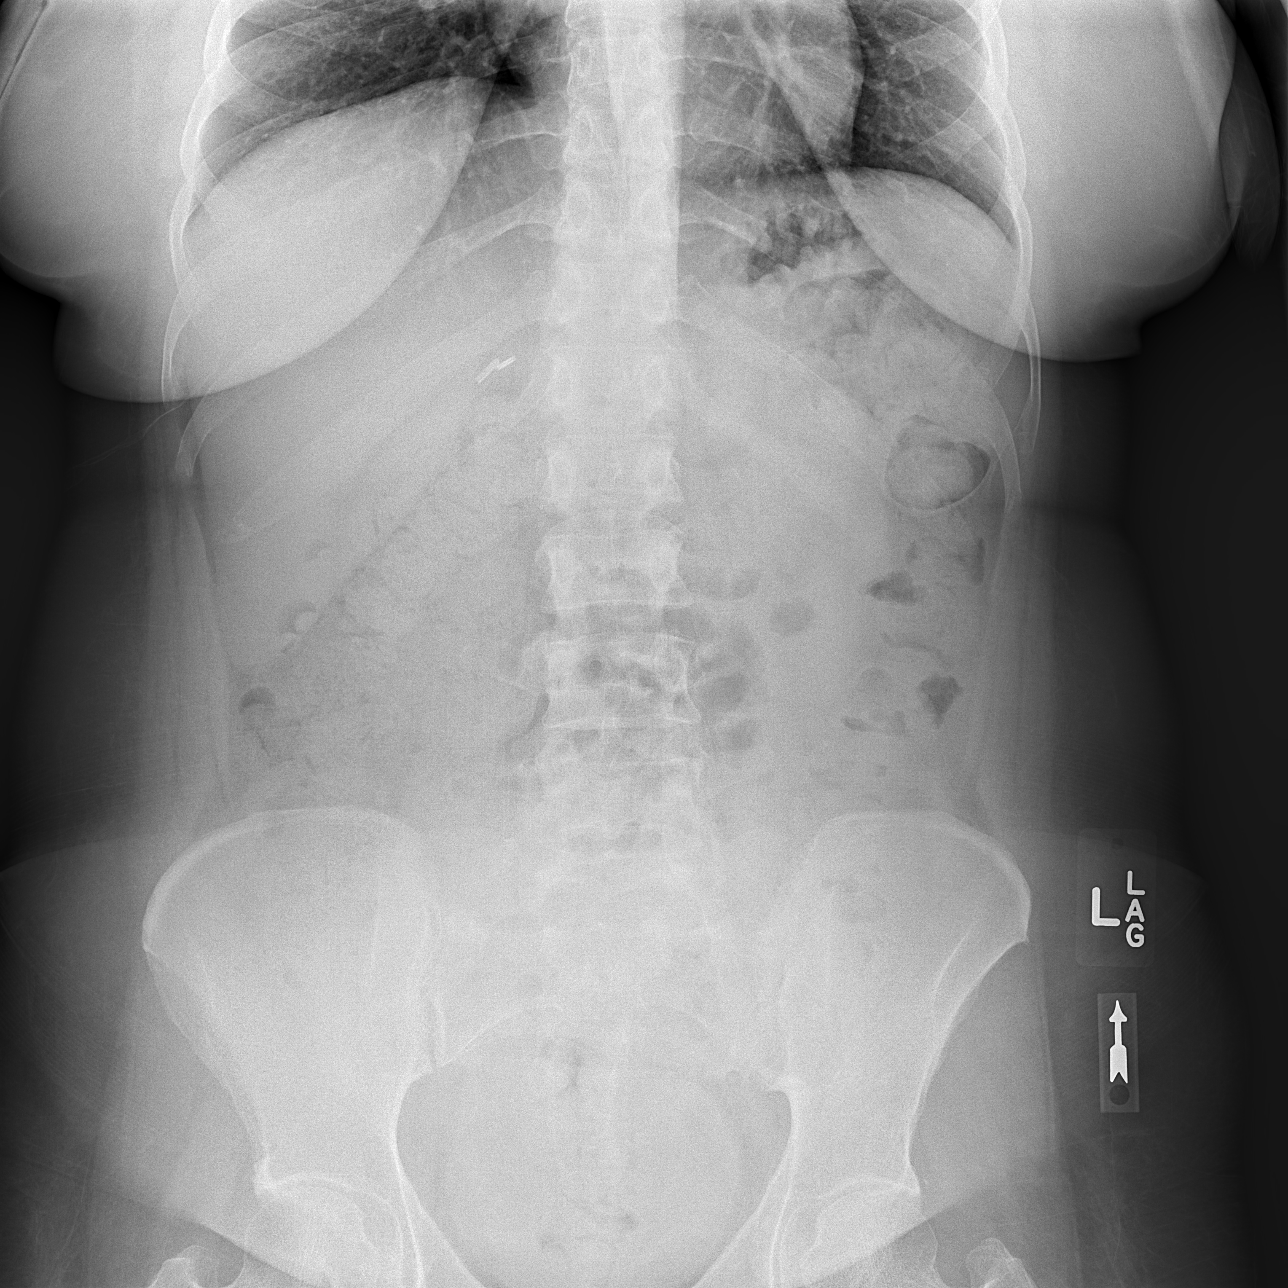

[t abdomen supine (1 of 2)]
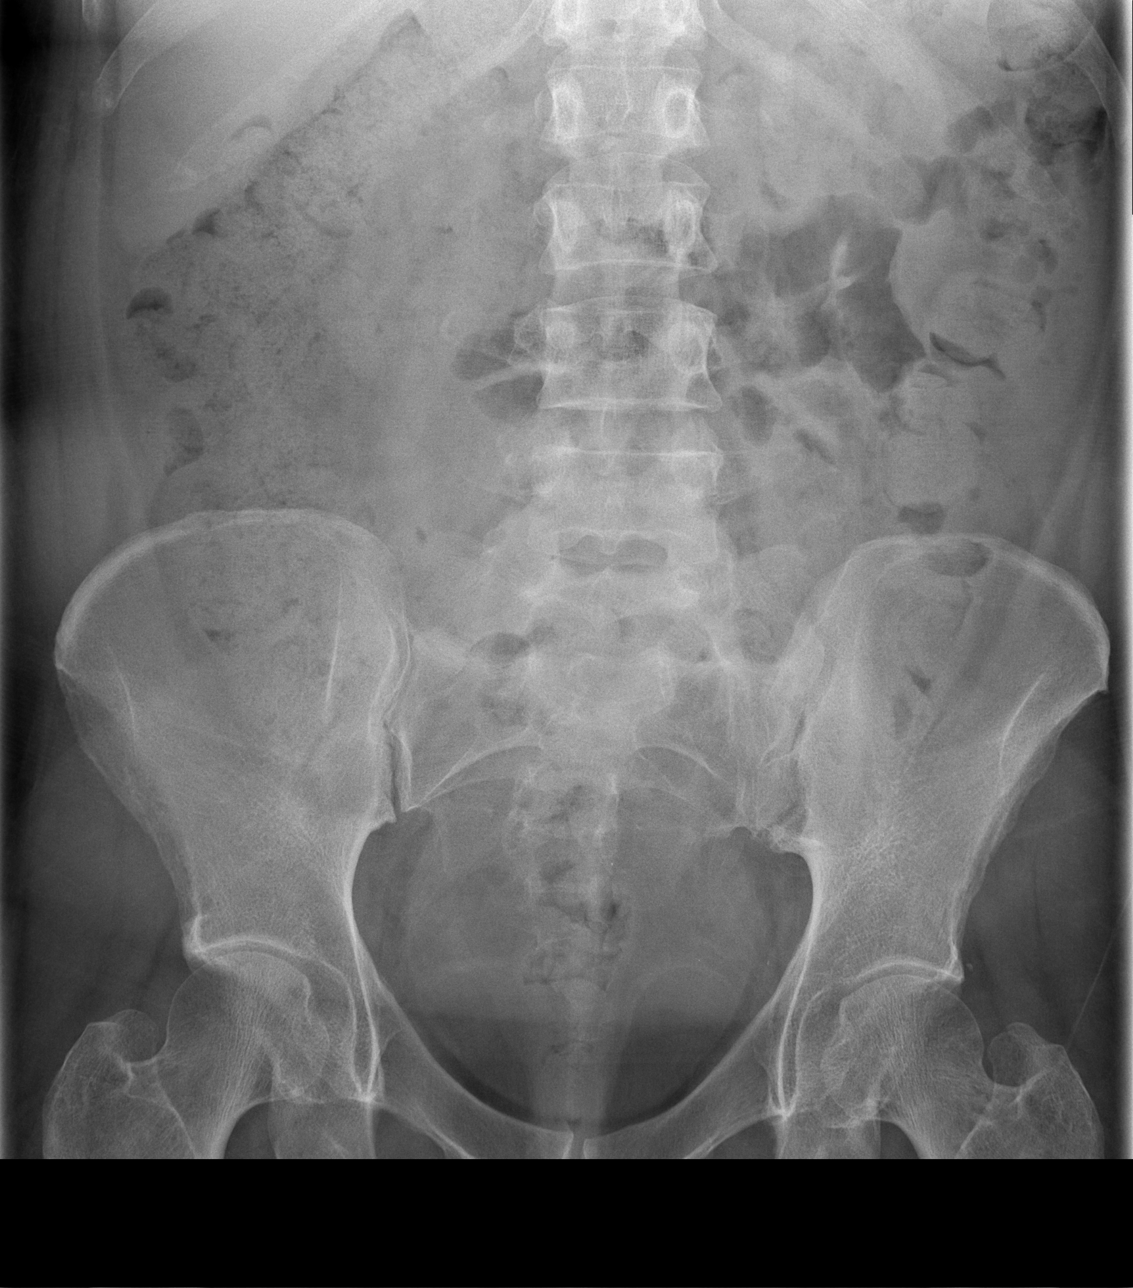

[t abdomen supine (2 of 2)]
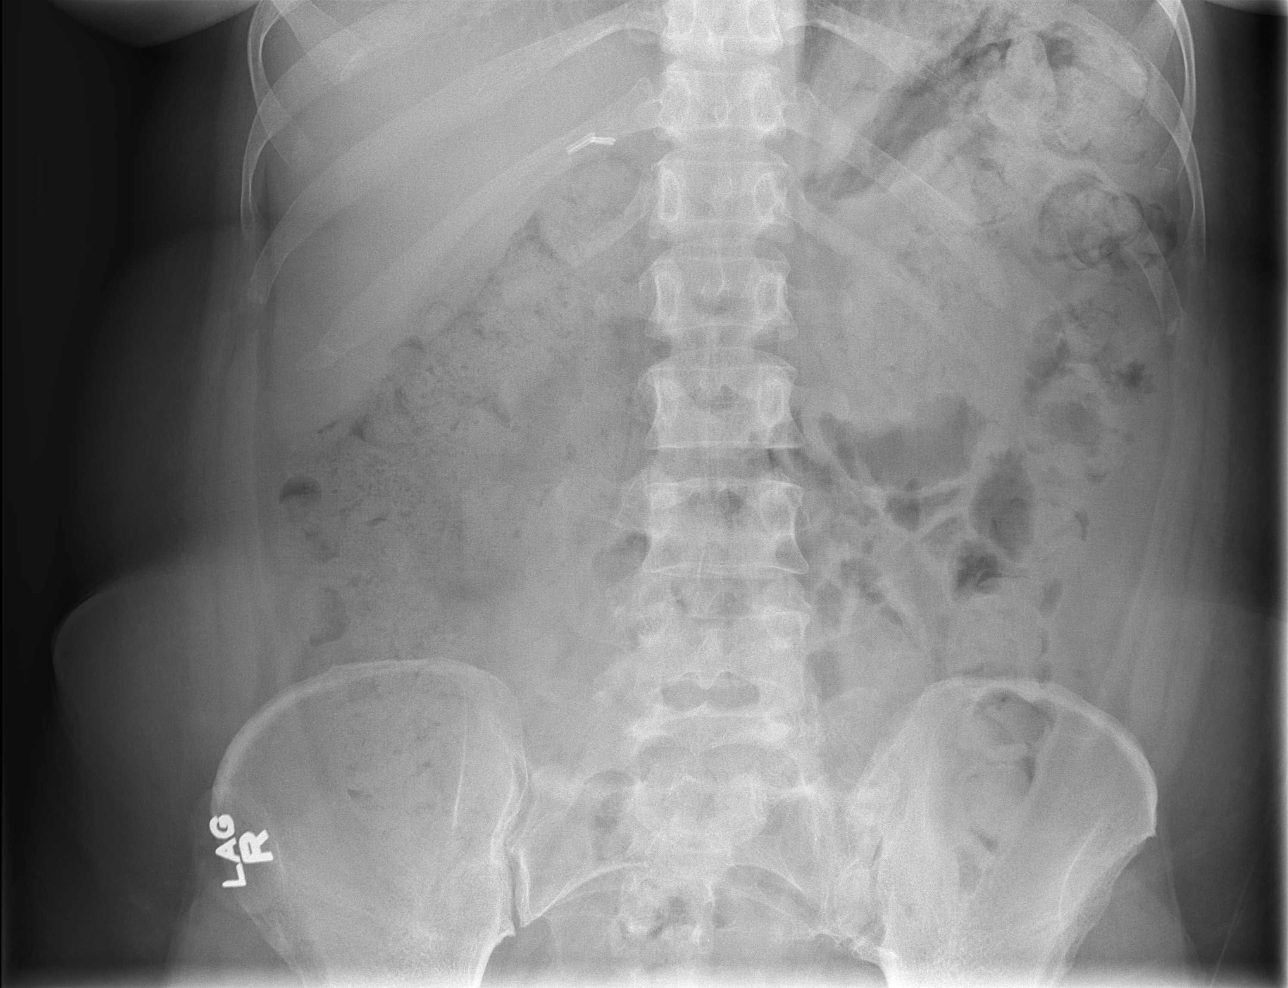

[3 of 3 positions shown; findings below may reference images not displayed]

FINDINGS: Stable right upper quadrant surgical clips.  Lung bases
appear negative.  No pneumoperitoneum. Nonobstructed bowel gas
pattern.  Retained stool throughout much of the colon. No acute
osseous abnormality identified.
IMPRESSION: Nonobstructed bowel gas pattern, no free air.

## 2014-04-20 ENCOUNTER — Telehealth (HOSPITAL_COMMUNITY): Payer: Self-pay | Admitting: *Deleted

## 2014-04-20 NOTE — Telephone Encounter (Signed)
Dr. Doyne Keel,   Patient called this afternoon.  Patient stated that the Seroquel is not helping, she cannot sleep well. Patient also stated at times her panic attacks become worse.  I asked patient if she is having any thoughts of hurting herself or anyone else, patient stated no.  Patient stated she feels fine now but at times she can have a panic attack and she is not sleeping good, Seroquel not helping.  I advised patient that you will be back in the office on Tuesday.  I advised patient that if symptoms become worse or any thoughts of hurting herself or anyone else to please call 911 or go directly to the emergency department.  Patient stated that she will be okay until you come back in on Tuesday.  Patient did not want a sooner appointment. Patient wanted a call back. Patient would like for you to call her on Tuesday, would like to discuss Seroquel and maybe upping the dosage to help her sleep better.   Thank you.

## 2014-04-24 NOTE — Telephone Encounter (Signed)
Called number listed on chart- unable to leave voice message as it only rang for several minutes.

## 2014-04-26 NOTE — Telephone Encounter (Signed)
Called and left voice message for call back to the clinic on pt's mobile phone.

## 2014-05-22 IMAGING — CT CT ABD-PELV W/ CM
2 of 4 series · 16 of 46 positions shown, 18 images · IV contrast (APPLIED)
Comparison: None.

CLINICAL DATA: Right flank pain.  Nausea.Appendicitis.

CT ABDOMEN AND PELVIS WITH CONTRAST
TECHNIQUE: Multidetector CT imaging of the abdomen and pelvis was
performed following the standard protocol during bolus
administration of intravenous contrast.
Contrast: 100mL OMNIPAQUE IOHEXOL 300 MG/ML  SOLN

[Series 2: abd/ pelvis 5.0 i30f 1 · axial · 0.79mm/px · z∈[-595,-155]mm · 13 of 96 slices shown, 15 images]
[im 4/96  soft-tissue]
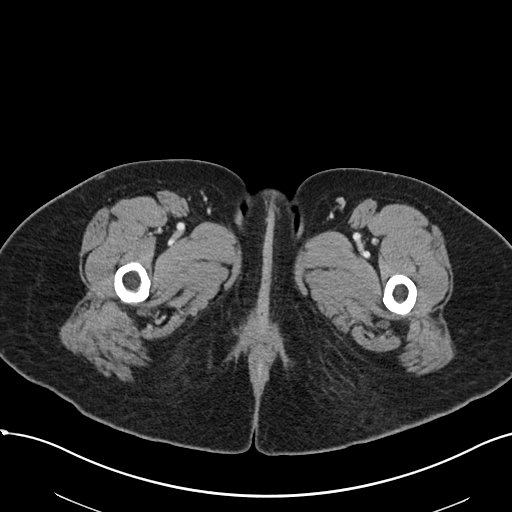
[im 4/96  bone]
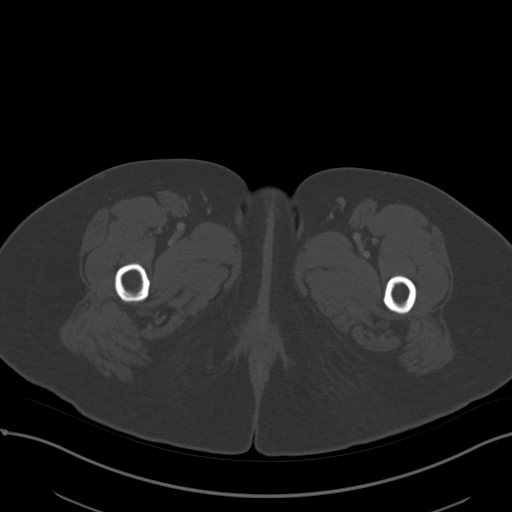
[im 11/96  soft-tissue]
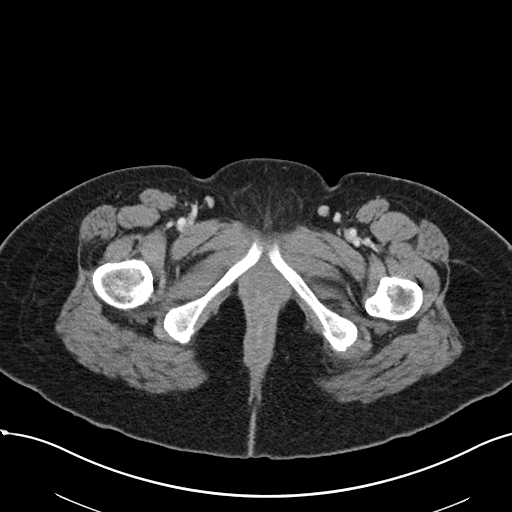
[im 19/96  soft-tissue]
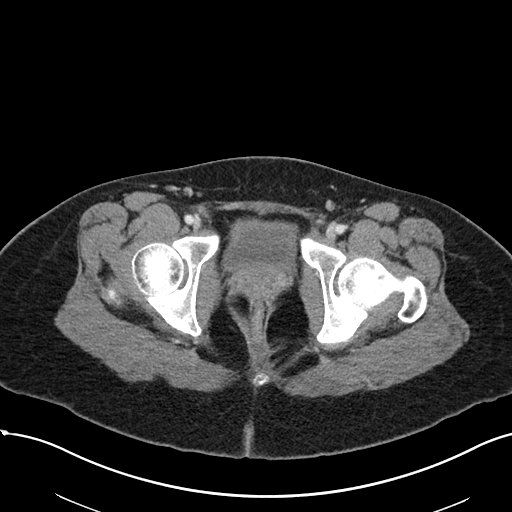
[im 26/96  soft-tissue]
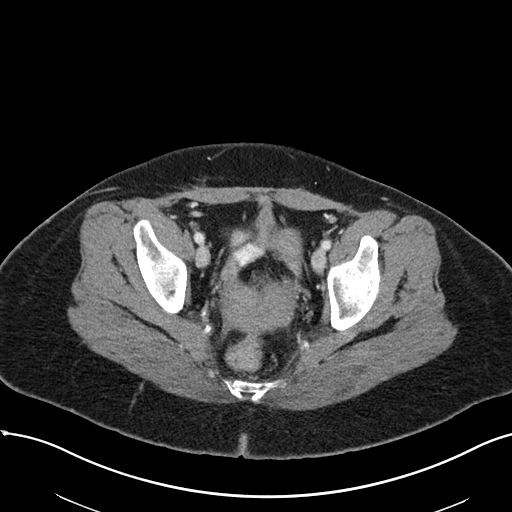
[im 33/96  soft-tissue]
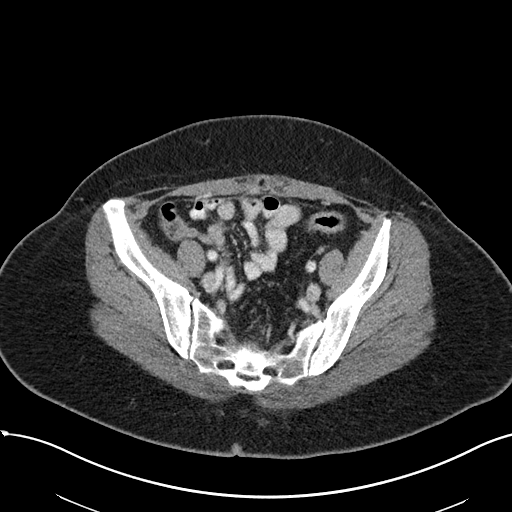
[im 41/96  soft-tissue]
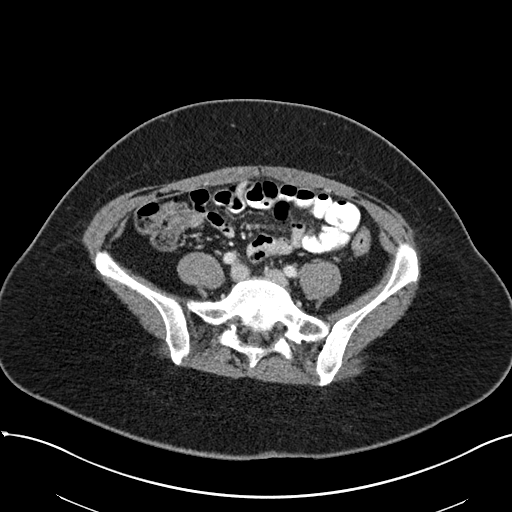
[im 48/96  soft-tissue]
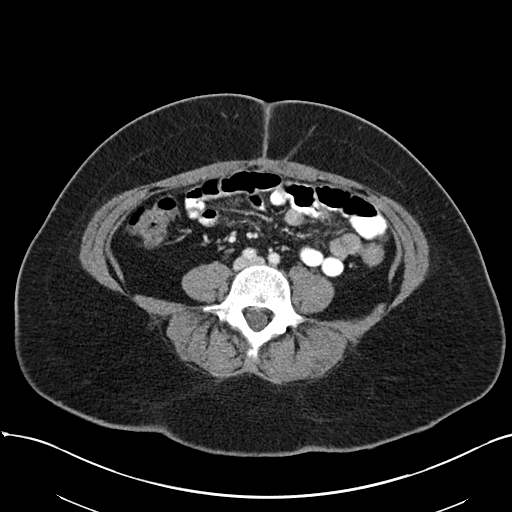
[im 55/96  soft-tissue]
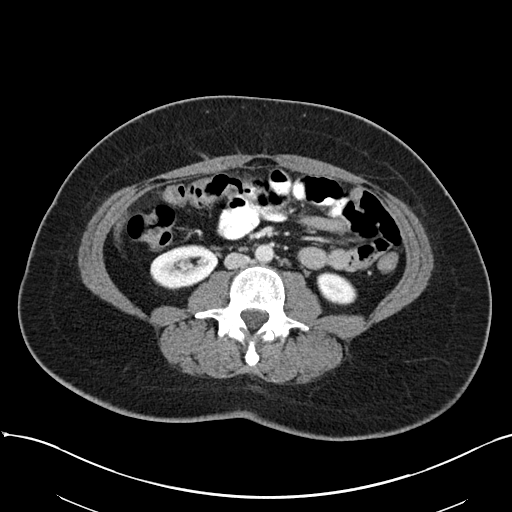
[im 63/96  soft-tissue]
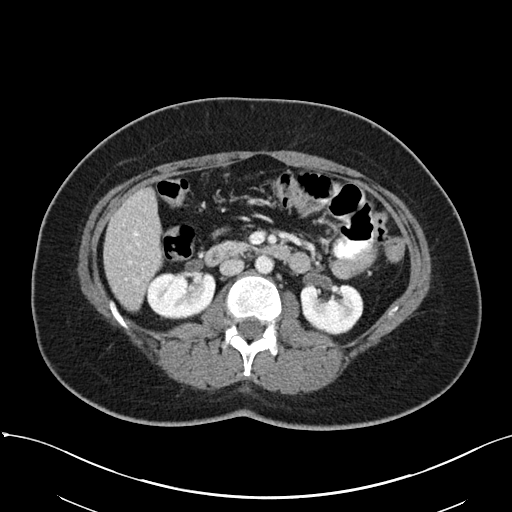
[im 63/96  bone]
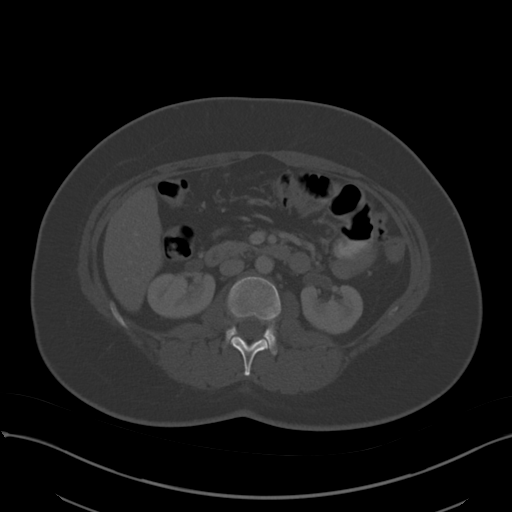
[im 70/96  soft-tissue]
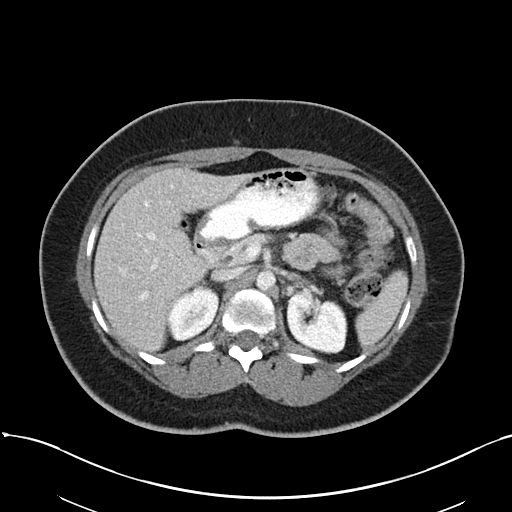
[im 77/96  soft-tissue]
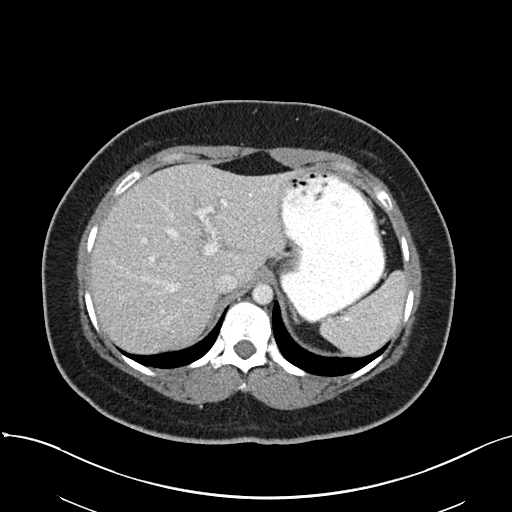
[im 85/96  soft-tissue]
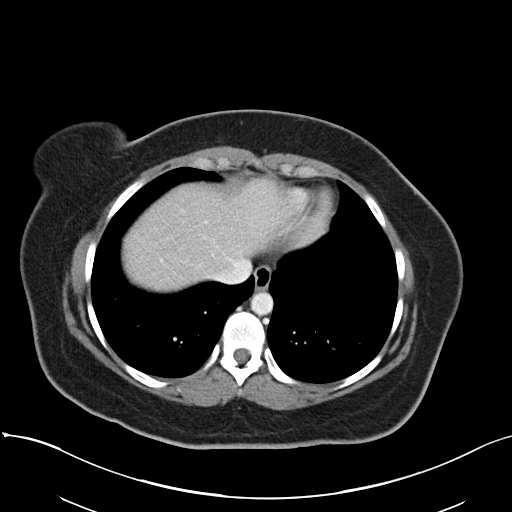
[im 92/96  soft-tissue]
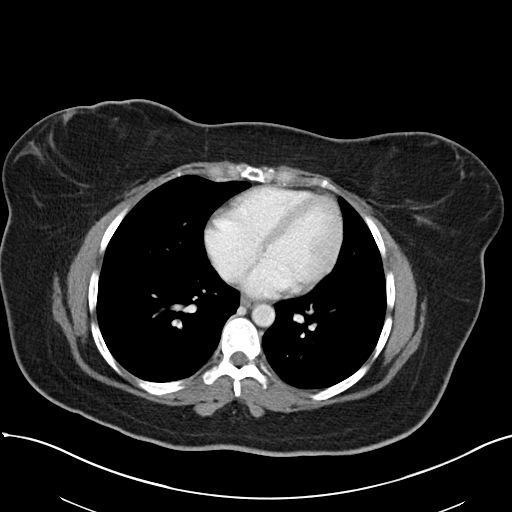

[Series 5: cor st · coronal · 0.66mm/px · 3 of 99 slices shown]
[im 33/99  soft-tissue]
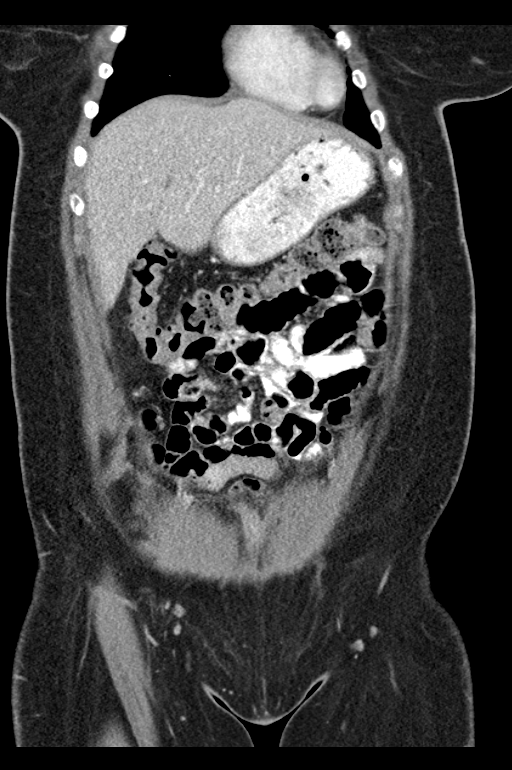
[im 44/99  soft-tissue]
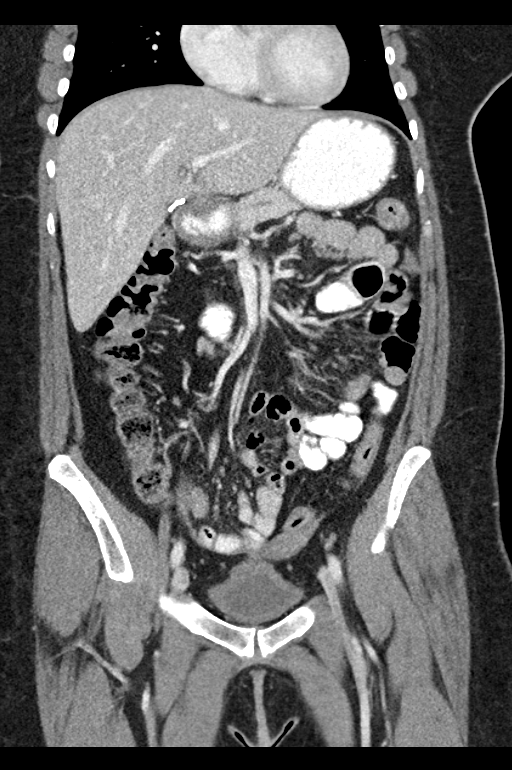
[im 55/99  soft-tissue]
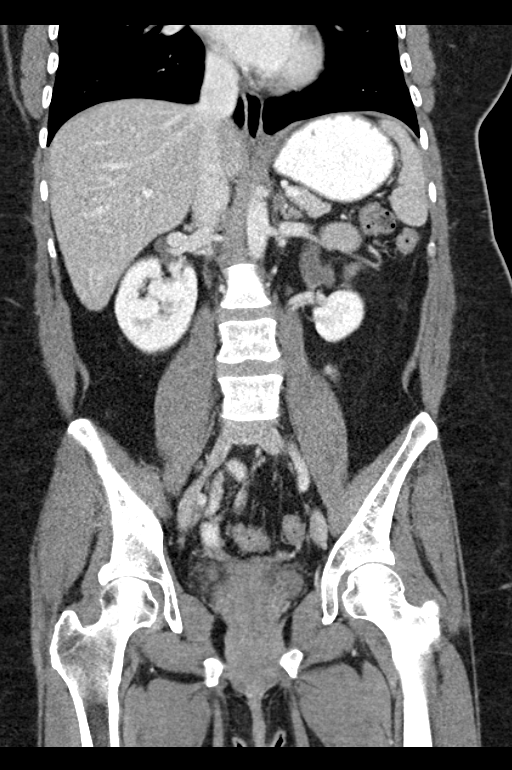

[16 of 46 positions shown; findings below may reference images not displayed]

FINDINGS: Lung Bases: Clear.

Liver:  Normal.

Spleen:  Normal.

Gallbladder:  Cholecystectomy.

Common bile duct:  Normal.

Pancreas:  Normal.

Adrenal glands:  Normal.

Kidneys:  Normal renal enhancement.  Both ureters appear within
normal limits.

Stomach:  Normal.

Small bowel:  Duodenum normal.  No small bowel obstruction.  No
mesenteric adenopathy.

Colon:   Normal appendix.  Distal colon is decompressed.  No
inflammatory changes or obstruction.

Pelvic Genitourinary:  Hysterectomy.  Mural thickening of the
urinary bladder may be due to under distention.  Recommend
correlation with urinalysis.  No free fluid.  No pelvic adenopathy.

Bones:  No aggressive osseous lesions.  Lumbosacral transitional
anatomy.

Vasculature: Mild atherosclerosis.

Body Wall: Normal.
IMPRESSION: 1.  Mild thickening of the urinary bladder is probably due to under
distention.  Correlation with urinalysis recommended to exclude
cystitis.

2.  Cholecystectomy and hysterectomy.
3.  Normal appendix.

## 2014-05-29 ENCOUNTER — Encounter (HOSPITAL_COMMUNITY): Payer: Self-pay | Admitting: Psychiatry

## 2014-05-29 ENCOUNTER — Ambulatory Visit (INDEPENDENT_AMBULATORY_CARE_PROVIDER_SITE_OTHER): Payer: BLUE CROSS/BLUE SHIELD | Admitting: Psychiatry

## 2014-05-29 VITALS — BP 123/70 | HR 82 | Ht 64.0 in | Wt 162.2 lb

## 2014-05-29 DIAGNOSIS — F411 Generalized anxiety disorder: Secondary | ICD-10-CM

## 2014-05-29 DIAGNOSIS — F4310 Post-traumatic stress disorder, unspecified: Secondary | ICD-10-CM

## 2014-05-29 DIAGNOSIS — F331 Major depressive disorder, recurrent, moderate: Secondary | ICD-10-CM | POA: Diagnosis not present

## 2014-05-29 MED ORDER — QUETIAPINE FUMARATE 200 MG PO TABS: 200.0000 mg | ORAL_TABLET | Freq: Every day | ORAL | Status: DC

## 2014-05-29 MED ORDER — FLUOXETINE HCL 40 MG PO CAPS: 40.0000 mg | ORAL_CAPSULE | Freq: Every day | ORAL | Status: DC
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
## 2014-06-20 ENCOUNTER — Encounter (HOSPITAL_COMMUNITY): Payer: Self-pay | Admitting: *Deleted

## 2014-06-20 ENCOUNTER — Emergency Department (HOSPITAL_COMMUNITY)
Admission: EM | Admit: 2014-06-20 | Discharge: 2014-06-20 | Disposition: A | Discharge status: Home / Self Care | Payer: BLUE CROSS/BLUE SHIELD | Attending: Emergency Medicine | Admitting: Emergency Medicine

## 2014-06-20 DIAGNOSIS — Z87442 Personal history of urinary calculi: Secondary | ICD-10-CM | POA: Diagnosis not present

## 2014-06-20 DIAGNOSIS — Z87828 Personal history of other (healed) physical injury and trauma: Secondary | ICD-10-CM | POA: Diagnosis not present

## 2014-06-20 DIAGNOSIS — R109 Unspecified abdominal pain: Secondary | ICD-10-CM

## 2014-06-20 DIAGNOSIS — Z7982 Long term (current) use of aspirin: Secondary | ICD-10-CM | POA: Insufficient documentation

## 2014-06-20 DIAGNOSIS — Z8719 Personal history of other diseases of the digestive system: Secondary | ICD-10-CM | POA: Insufficient documentation

## 2014-06-20 DIAGNOSIS — Z794 Long term (current) use of insulin: Secondary | ICD-10-CM | POA: Insufficient documentation

## 2014-06-20 DIAGNOSIS — E1040 Type 1 diabetes mellitus with diabetic neuropathy, unspecified: Secondary | ICD-10-CM | POA: Diagnosis not present

## 2014-06-20 DIAGNOSIS — Z9049 Acquired absence of other specified parts of digestive tract: Secondary | ICD-10-CM | POA: Diagnosis not present

## 2014-06-20 DIAGNOSIS — F4310 Post-traumatic stress disorder, unspecified: Secondary | ICD-10-CM | POA: Diagnosis not present

## 2014-06-20 DIAGNOSIS — Z79899 Other long term (current) drug therapy: Secondary | ICD-10-CM | POA: Insufficient documentation

## 2014-06-20 DIAGNOSIS — F419 Anxiety disorder, unspecified: Secondary | ICD-10-CM | POA: Diagnosis not present

## 2014-06-20 DIAGNOSIS — Z8679 Personal history of other diseases of the circulatory system: Secondary | ICD-10-CM | POA: Diagnosis not present

## 2014-06-20 DIAGNOSIS — M1990 Unspecified osteoarthritis, unspecified site: Secondary | ICD-10-CM | POA: Diagnosis not present

## 2014-06-20 DIAGNOSIS — F329 Major depressive disorder, single episode, unspecified: Secondary | ICD-10-CM | POA: Insufficient documentation

## 2014-06-20 DIAGNOSIS — R1030 Lower abdominal pain, unspecified: Secondary | ICD-10-CM | POA: Insufficient documentation

## 2014-06-20 DIAGNOSIS — Z3202 Encounter for pregnancy test, result negative: Secondary | ICD-10-CM | POA: Insufficient documentation

## 2014-06-20 DIAGNOSIS — E785 Hyperlipidemia, unspecified: Secondary | ICD-10-CM | POA: Diagnosis not present

## 2014-06-20 LAB — URINALYSIS, ROUTINE W REFLEX MICROSCOPIC
Bilirubin Urine: NEGATIVE
Glucose, UA: >1000 mg/dL — AB
Hgb urine dipstick: NEGATIVE
Ketones, ur: NEGATIVE mg/dL
Leukocytes, UA: NEGATIVE
Nitrite: NEGATIVE
Protein, ur: NEGATIVE mg/dL
Specific Gravity, Urine: 1.036 — ABNORMAL HIGH (ref 1.005–1.030)
Urobilinogen, UA: 1 mg/dL (ref 0.0–1.0)
pH: 6.5 (ref 5.0–8.0)

## 2014-06-20 LAB — COMPREHENSIVE METABOLIC PANEL
ALT: 19 U/L (ref 14–54)
AST: 19 U/L (ref 15–41)
Albumin: 3.8 g/dL (ref 3.5–5.0)
Alkaline Phosphatase: 123 U/L (ref 38–126)
Anion gap: 14 (ref 5–15)
BUN: 7 mg/dL (ref 6–20)
CO2: 26 mmol/L (ref 22–32)
Calcium: 9.7 mg/dL (ref 8.9–10.3)
Chloride: 95 mmol/L — ABNORMAL LOW (ref 101–111)
Creatinine, Ser: 1.1 mg/dL — ABNORMAL HIGH (ref 0.44–1.00)
GFR calc Af Amer: >60 mL/min (ref >60)
GFR calc non Af Amer: >60 mL/min (ref >60)
Glucose, Bld: 252 mg/dL — ABNORMAL HIGH (ref 65–99)
Potassium: 3.9 mmol/L (ref 3.5–5.1)
Sodium: 135 mmol/L (ref 135–145)
Total Bilirubin: 1.1 mg/dL (ref 0.3–1.2)
Total Protein: 7.8 g/dL (ref 6.5–8.1)

## 2014-06-20 LAB — CBC WITH DIFFERENTIAL/PLATELET
Basophils Absolute: 0 10*3/uL (ref 0.0–0.1)
Basophils Relative: 0 % (ref 0–1)
Eosinophils Absolute: 0.1 10*3/uL (ref 0.0–0.7)
Eosinophils Relative: 1 % (ref 0–5)
HCT: 42.3 % (ref 36.0–46.0)
Hemoglobin: 14.8 g/dL (ref 12.0–15.0)
Lymphocytes Relative: 16 % (ref 12–46)
Lymphs Abs: 2.1 10*3/uL (ref 0.7–4.0)
MCH: 33 pg (ref 26.0–34.0)
MCHC: 35 g/dL (ref 30.0–36.0)
MCV: 94.4 fL (ref 78.0–100.0)
Monocytes Absolute: 1.1 10*3/uL — ABNORMAL HIGH (ref 0.1–1.0)
Monocytes Relative: 9 % (ref 3–12)
Neutro Abs: 9.4 10*3/uL — ABNORMAL HIGH (ref 1.7–7.7)
Neutrophils Relative %: 74 % (ref 43–77)
Platelets: 359 10*3/uL (ref 150–400)
RBC: 4.48 MIL/uL (ref 3.87–5.11)
RDW: 12.8 % (ref 11.5–15.5)
WBC: 12.7 10*3/uL — ABNORMAL HIGH (ref 4.0–10.5)

## 2014-06-20 LAB — URINE MICROSCOPIC-ADD ON

## 2014-06-20 LAB — POC URINE PREG, ED: Preg Test, Ur: NEGATIVE

## 2014-06-20 MED ORDER — SODIUM CHLORIDE 0.9 % IV SOLN
1000.0000 mL | Freq: Once | INTRAVENOUS | Status: AC
  Administered 2014-06-20: 1000 mL via INTRAVENOUS

## 2014-06-20 MED ORDER — ONDANSETRON HCL 4 MG/2ML IJ SOLN
4.0000 mg | Freq: Once | INTRAMUSCULAR | Status: AC
  Administered 2014-06-20: 4 mg via INTRAVENOUS
  Filled 2014-06-20: qty 2

## 2014-06-20 MED ORDER — ONDANSETRON 4 MG PO TBDP: 4.0000 mg | ORAL_TABLET | Freq: Three times a day (TID) | ORAL | Status: DC | PRN

## 2014-06-20 MED ORDER — ACETAMINOPHEN 325 MG PO TABS: 650.0000 mg | ORAL_TABLET | Freq: Once | ORAL | Status: DC

## 2014-06-20 MED ORDER — OXYCODONE-ACETAMINOPHEN 5-325 MG PO TABS: 1.0000 | ORAL_TABLET | ORAL | Status: DC | PRN

## 2014-06-20 NOTE — ED Notes (Signed)
Pt to ED c/o R sided flank into lower abd pain with nausea onset yesterday. Pt has hx of kidney stones; reports pain is similar

## 2014-06-20 NOTE — ED Provider Notes (Signed)
CSN: [id number]     Arrival date & time 06/20/14  7564 History   First MD Initiated Contact with Patient 06/20/14 [phone]     Chief Complaint  Patient presents with  . Abdominal Pain     (Consider location/radiation/quality/duration/timing/severity/associated sxs/prior Treatment) Patient is a 44 y.o. female presenting with abdominal pain. The history is provided by the patient and medical records.  Abdominal Pain  This is a 44 y.o. F with PMH significant for HLP, fibromyalgia, PTSD, anxiety, MDD, kidney stones, presenting to the ED for flank pain which began last night.  Patient states she has bilateral flank pain, R > L, which she describes as a dull ache without radiation.  She reports associated nausea but denies vomiting.  She denies difficulty urinating but states that she frequently has the urge to go, but only urinates a small amount.  She states last bout with kidney stones was in April, she was not seen in the ED at that time.  She denies fever, chills, sweats.  BM's normal.  VSS.  Past Medical History  Diagnosis Date  . 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 Fibromyalgia   . Hyperlipidemia   . Abdominal pain   . Nausea & vomiting   . Biliary dyskinesia   . Depression   . Joint pain   . Skin rash     due to medications  . Post traumatic stress disorder (PTSD)   . Sleep trouble   . Major depressive disorder   . Cervical strain   . Closed head injury 2010  . Renal stones   . Migraine     hx of none recent  . Arthritis   . Hypoglycemia   . Anxiety    Past Surgical History  Procedure Laterality Date  . Lipoma removed  yrs ago  . Knee surgery Left 2012  . Cesarean section  1997, 1999  . Cholecystectomy  02/10/2011    Procedure: LAPAROSCOPIC CHOLECYSTECTOMY WITH INTRAOPERATIVE CHOLANGIOGRAM;  Surgeon: Judieth Keens, DO;  Location: [place];  Service: General;  Laterality: N/A;  . Robotic assisted laparoscopic lysis of adhesion N/A  03/04/2012    Procedure: ROBOTIC ASSISTED LAPAROSCOPIC LYSIS OF EXTENSIVE ADHESIONS;  Surgeon: Claiborne Billings A. Pamala Hurry, MD;  Location: [facility];  Service: Gynecology;  Laterality: N/A;  . Tonsillectomy  2001  . Abdominal hysterectomy  2001  . Eye surgery  2006    laser eye surgery left eye  . Exploratory laparomty  Mar 04, 2012  . Laparoscopic appendectomy N/A 10/12/2012    Procedure: DIAGNOSTIC APPENDECTOMY LAPAROSCOPIC;  Surgeon: Madilyn Hook, DO;  Location: [facility];  Service: General;  Laterality: N/A;  . Appendectomy     Family History  Problem Relation Age of Onset  . Cancer Father     lymphoma  . Nephrolithiasis Father   . Vascular Disease Father   . Alcohol abuse Father   . Drug abuse Father   . Cancer Maternal Grandmother     colon  . Hypertension Mother   . Heart disease Mother   . Cataracts Mother   . Depression Mother   . Diabetes Brother   . Drug abuse Brother   . Mental illness Maternal Grandfather   . Cancer Maternal Grandfather   . Heart disease Paternal Grandmother   . Heart disease Paternal Grandfather   . Suicidality Maternal Uncle    History  Substance Use Topics  . Smoking status: Never Smoker   . Smokeless tobacco: Never Used  .  Alcohol Use: No   OB History    No data available     Review of Systems  Genitourinary: Positive for urgency and flank pain.  All other systems reviewed and are negative.     Allergies  Meloxicam; Cephalexin; and Ibuprofen  Home Medications   Prior to Admission medications   Medication Sig Start Date End Date Taking? Authorizing Provider  acetaminophen (TYLENOL) 325 MG tablet Take 650 mg by mouth every 6 (six) hours as needed for mild pain or headache.    Historical Provider, MD  aspirin EC 81 MG tablet Take 1 tablet (81 mg total) by mouth daily with breakfast. 10/16/13   Kerrie Buffalo, NP  atorvastatin (LIPITOR) 40 MG tablet Take 1 tablet (40 mg total) by mouth daily. Resume home meds for high cholesterol 10/16/13   Kerrie Buffalo, NP  cholecalciferol (VITAMIN D) 1000 UNITS tablet Take 1,000 Units by mouth daily.    Historical Provider, MD  cyclobenzaprine (FLEXERIL) 5 MG tablet Take 5 mg by mouth daily as needed. 02/06/14   Historical Provider, MD  FLUoxetine (PROZAC) 40 MG capsule Take 1 capsule (40 mg total) by mouth daily. For depression 05/29/14   Charlcie Cradle, MD  HYDROcodone-acetaminophen [facility]) 5-325 MG per tablet Take 1-2 tablets by mouth every 6 (six) hours as needed. 02/01/14   Veryl Speak, MD  insulin aspart (NOVOLOG) 100 UNIT/ML injection Inject 8 Units into the skin 3 (three) times daily with meals. 10/16/13   Kerrie Buffalo, NP  insulin glargine (LANTUS) 100 UNIT/ML injection Inject 30 Units into the skin at bedtime.    Historical Provider, MD  ipratropium (ATROVENT) 0.06 % nasal spray Place 2 sprays into both nostrils 4 (four) times daily. 11/10/13   Billy Fischer, MD  Lido-Capsaicin-Men-Methyl Sal (MEDI-PATCH-LIDOCAINE EX) Apply 1 patch topically daily.    Historical Provider, MD  MELATONIN PO Take 1 tablet by mouth at bedtime.    Historical Provider, MD  metoCLOPramide (REGLAN) 10 MG tablet Take 1 tablet (10 mg total) by mouth every 6 (six) hours as needed for nausea or vomiting (headache). 01/04/14   Wandra Arthurs, MD  ondansetron (ZOFRAN ODT) 4 MG disintegrating tablet Take 1 tablet (4 mg total) by mouth every 8 (eight) hours as needed for nausea or vomiting. 01/30/14   Gareth Morgan, MD  ondansetron (ZOFRAN) 4 MG tablet Take 1 tablet (4 mg total) by mouth every 6 (six) hours. Prn n/v 11/10/13   Billy Fischer, MD  Oxycodone HCl 10 MG TABS Take 10 mg by mouth 3 (three) times daily with meals as needed.  03/08/14   Historical Provider, MD  promethazine (PHENERGAN) 25 MG tablet Take 1 tablet (25 mg total) by mouth every 6 (six) hours as needed for nausea or vomiting (headache). 01/04/14   Wandra Arthurs, MD  QUEtiapine (SEROQUEL) 200 MG tablet Take 1 tablet (200 mg total) by mouth at bedtime. 05/29/14 05/29/15   Charlcie Cradle, MD   BP 162/76 mmHg  Pulse 87  Temp(Src) 98.6 F (37 C) (Oral)  Resp 16  SpO2 99%   Physical Exam  Constitutional: She is oriented to person, place, and time. She appears well-developed and well-nourished. No distress.  Resting comfortably, watching TV  HENT:  Head: Normocephalic and atraumatic.  Mouth/Throat: Oropharynx is clear and moist.  Eyes: Conjunctivae and EOM are normal. Pupils are equal, round, and reactive to light.  Neck: Normal range of motion. Neck supple.  Cardiovascular: Normal rate, regular rhythm and normal heart sounds.  Pulmonary/Chest: Effort normal and breath sounds normal. No respiratory distress. She has no wheezes.  Abdominal: Soft. Bowel sounds are normal. There is no tenderness. There is no guarding and no CVA tenderness.  Abdomen soft, non-distended, no focal tenderness CVA non-tender  Musculoskeletal: Normal range of motion. She exhibits no edema.  Neurological: She is alert and oriented to person, place, and time.  Skin: Skin is warm and dry. She is not diaphoretic.  Psychiatric: She has a normal mood and affect.  Nursing note and vitals reviewed.   ED Course  Procedures (including critical care time) Labs Review Labs Reviewed  CBC WITH DIFFERENTIAL/PLATELET - Abnormal; Notable for the following:    WBC 12.7 (*)    Neutro Abs 9.4 (*)    Monocytes Absolute 1.1 (*)    All other components within normal limits  COMPREHENSIVE METABOLIC PANEL - Abnormal; Notable for the following:    Chloride 95 (*)    Glucose, Bld 252 (*)    Creatinine, Ser 1.10 (*)    All other components within normal limits  URINALYSIS, ROUTINE W REFLEX MICROSCOPIC (NOT AT [facility]) - Abnormal; Notable for the following:    APPearance CLOUDY (*)    Specific Gravity, Urine 1.036 (*)    Glucose, UA >1000 (*)    All other components within normal limits  URINE MICROSCOPIC-ADD ON - Abnormal; Notable for the following:    Squamous Epithelial / LPF MANY (*)     Bacteria, UA FEW (*)    All other components within normal limits  POC URINE PREG, ED    Imaging Review No results found.   EKG Interpretation None      MDM   Final diagnoses:  Abdominal pain, unspecified abdominal location  Flank pain   44 y.o. F here with bilateral flank pain and nausea.  She states she feels like she may have a kidney stone.  Patient afebrile, non-toxic, and in NAD.  VSS.  She has no focal abdominal or CVA tenderness. Reports nausea but no vomiting.  Appears well hydrated.  Lab work obtained which is reassuring-- leukocytosis appears baseline when compared with prior values.  She also has slight hyperglycemia without evidence of DKA.  U/a appears contaminated-- no blood to suggest acute stone.  Patient has not required any pain medication here in ED, she was given zofran with improvement of her nausea.  No active vomiting here or at home.  Patient stable for discharge with symptomatic care. Will FU with PCP.  Discussed plan with patient, he/she acknowledged understanding and agreed with plan of care.  Return precautions given for new or worsening symptoms.  Larene Pickett, PA-C 06/20/14 1201  Ernestina Patches, MD 06/21/14 1056

## 2014-06-20 NOTE — Discharge Instructions (Signed)
Take the prescribed medication as directed. °Follow-up with your primary care physician. °Return to the ED for new or worsening symptoms. ° °

## 2014-06-26 IMAGING — CR DG CHEST 2V
2 series · 2 of 2 positions shown · non-contrast
Comparison: 05/18/2012.

CLINICAL DATA: Coughing and fever with shortness of breath.

CHEST - 2 VIEW

[w chest pa]
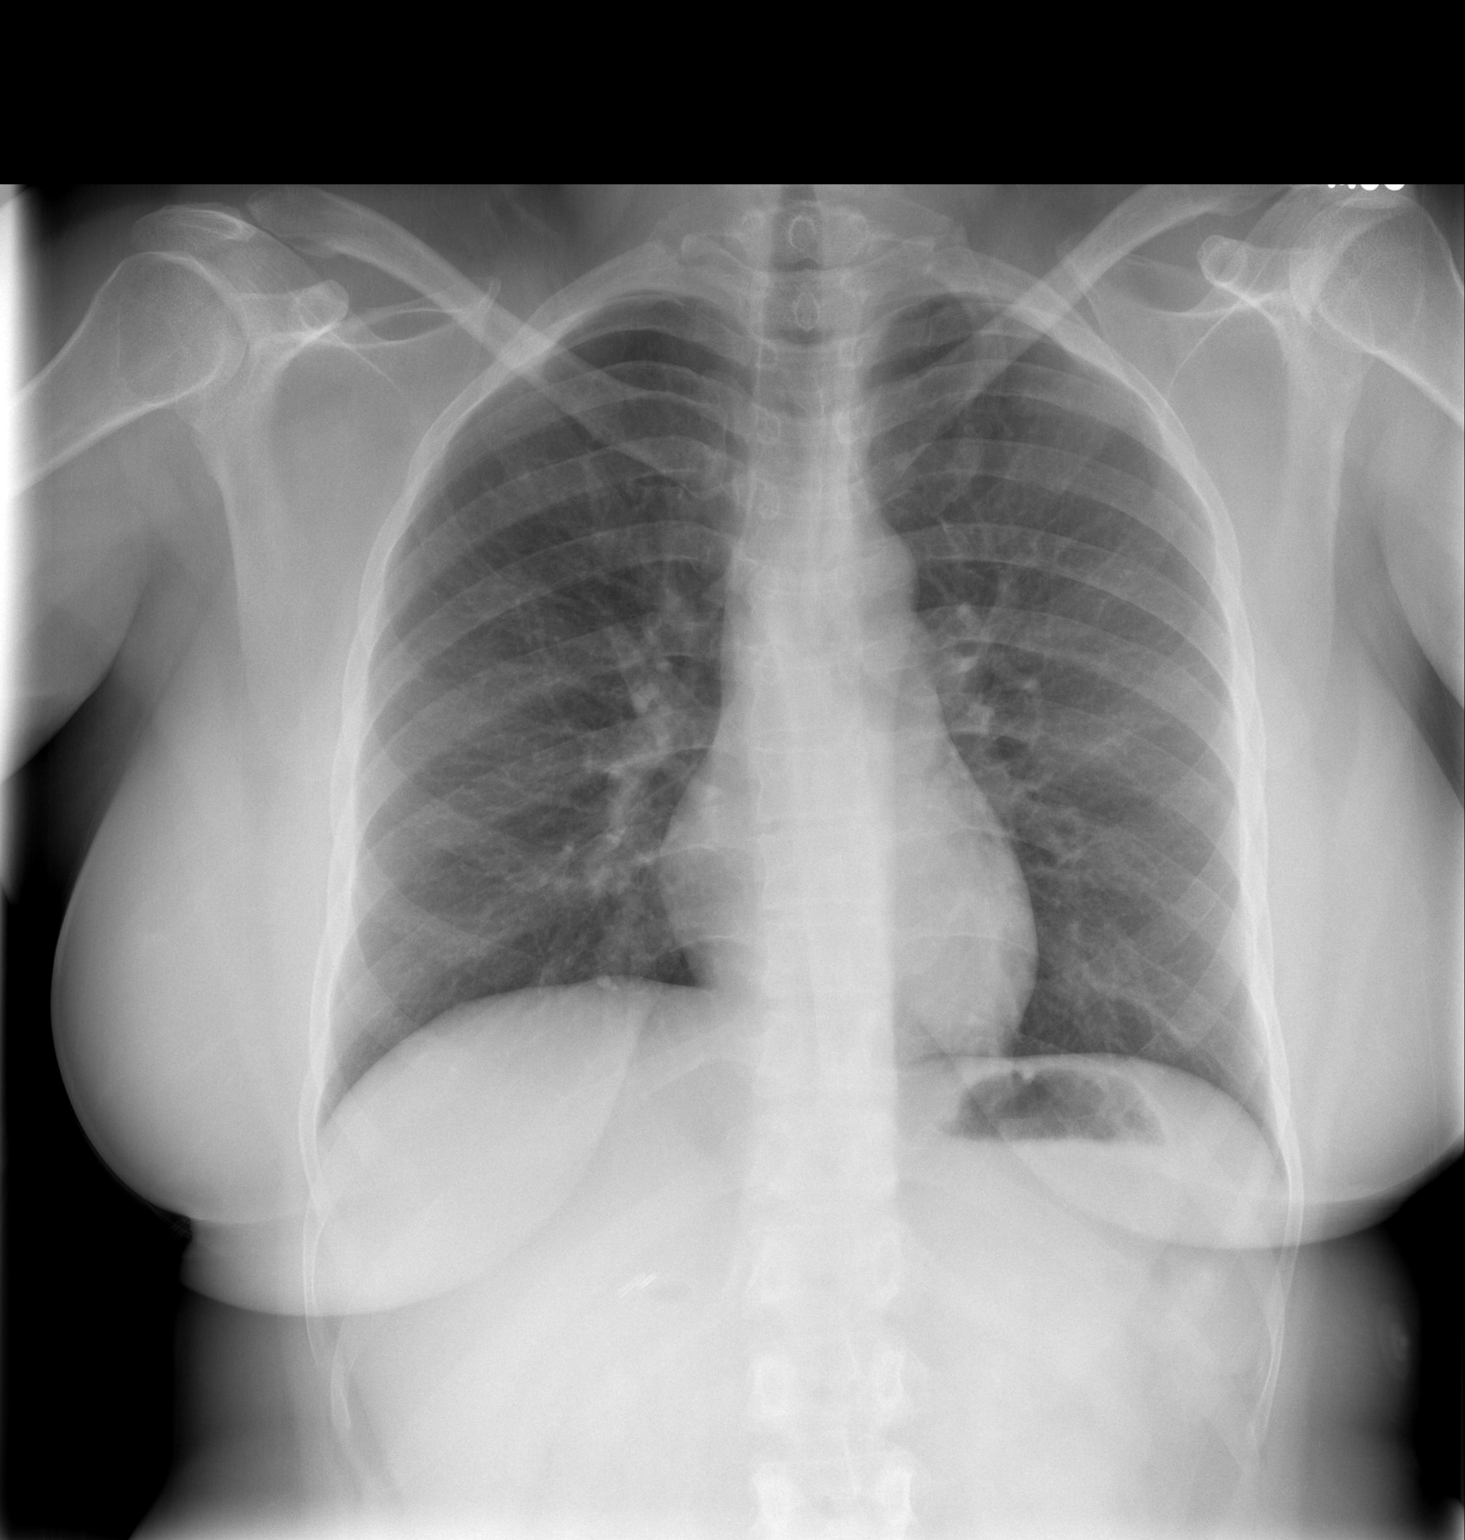

[w chest lat]
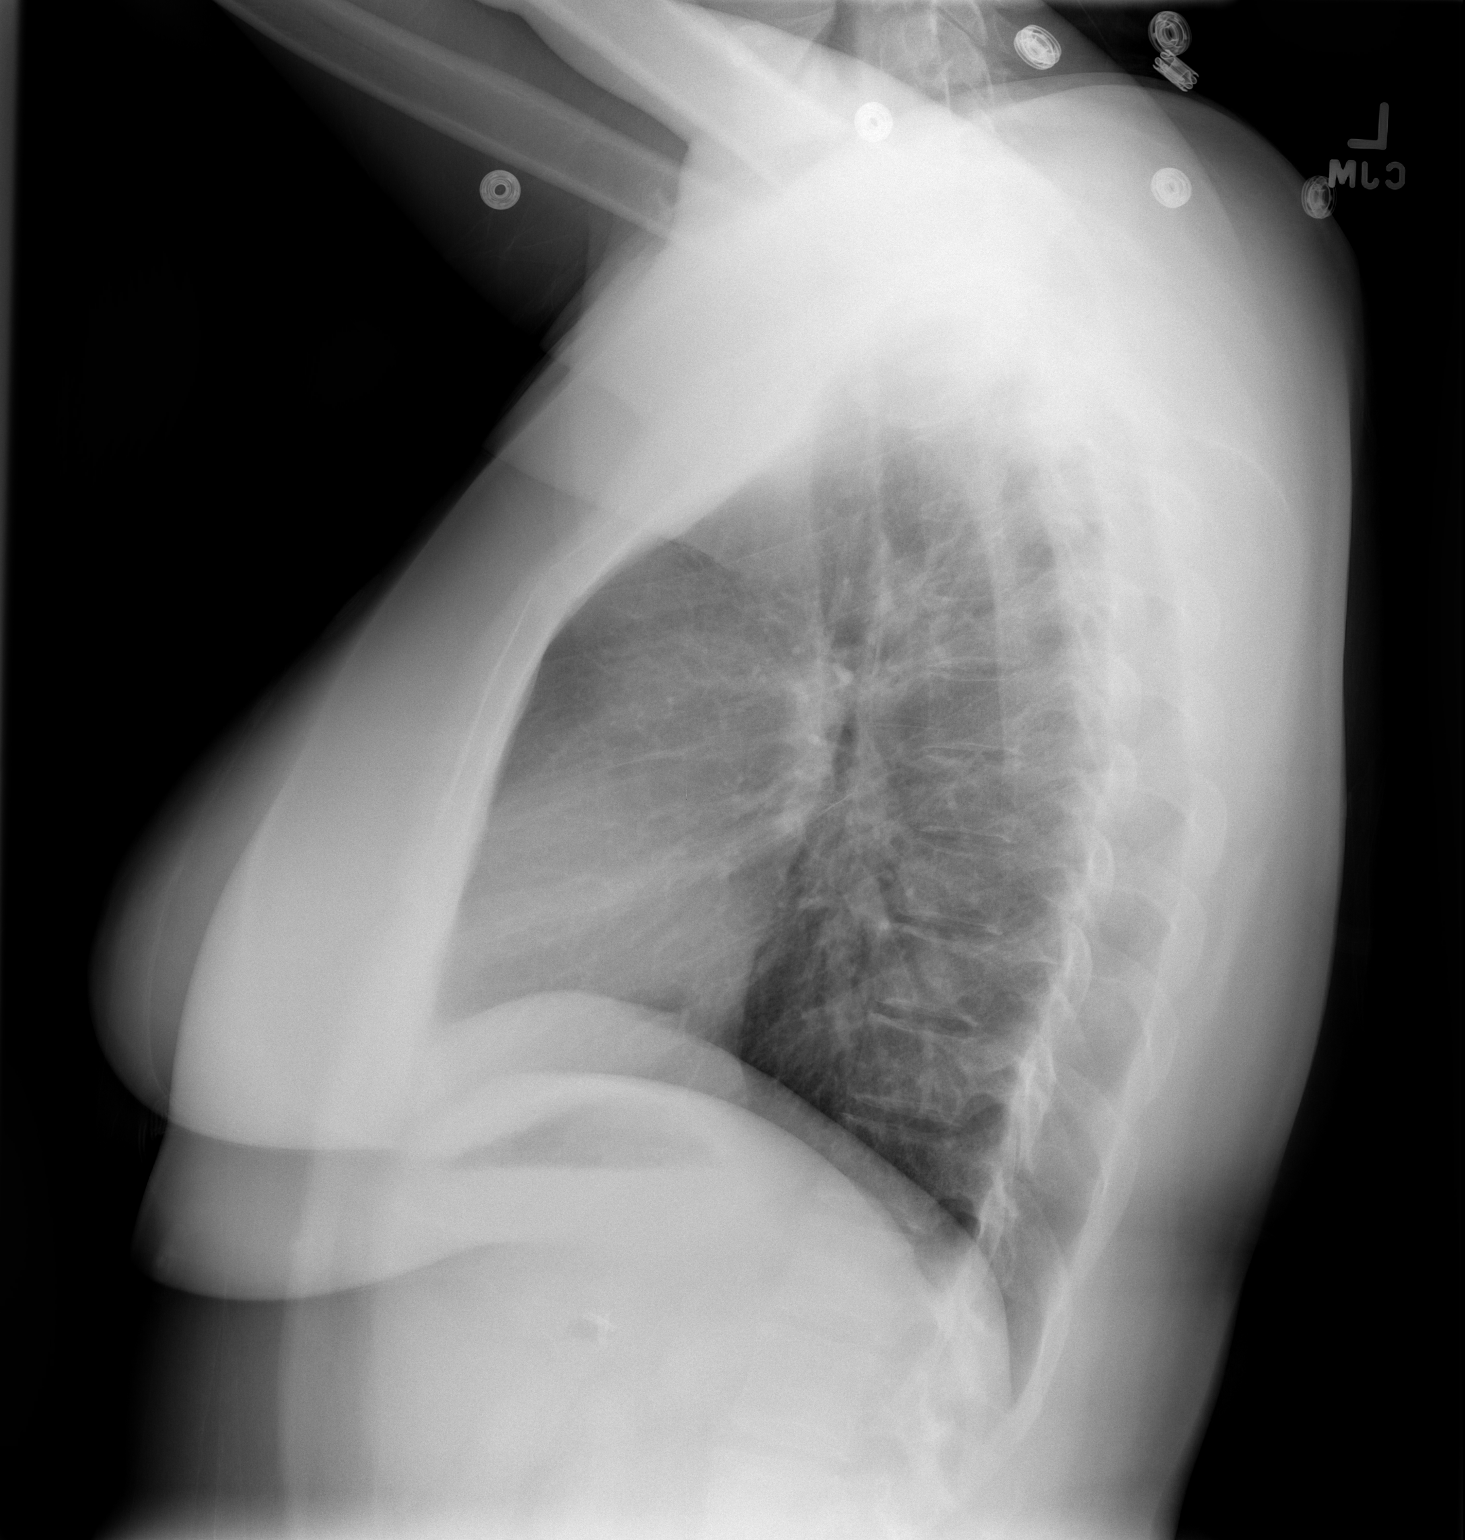

[2 of 2 positions shown; findings below may reference images not displayed]

FINDINGS: The cardiac silhouette is normal size and shape.
Mediastinal and hilar contours appear stable and normal.  No
pulmonary infiltrates or masses are seen. No pleural abnormality is
evident. Bones appear average for age.
IMPRESSION: No acute or active cardiopulmonary or pleural abnormalities are
identified.

## 2014-06-29 IMAGING — CR DG SHOULDER 2+V*R*
3 series · 3 of 3 positions shown · non-contrast
Comparison: None

CLINICAL DATA: Right shoulder pain for 1 week, pain at shoulder
blade, no known injury

RIGHT SHOULDER - 2+ VIEW

[AP]
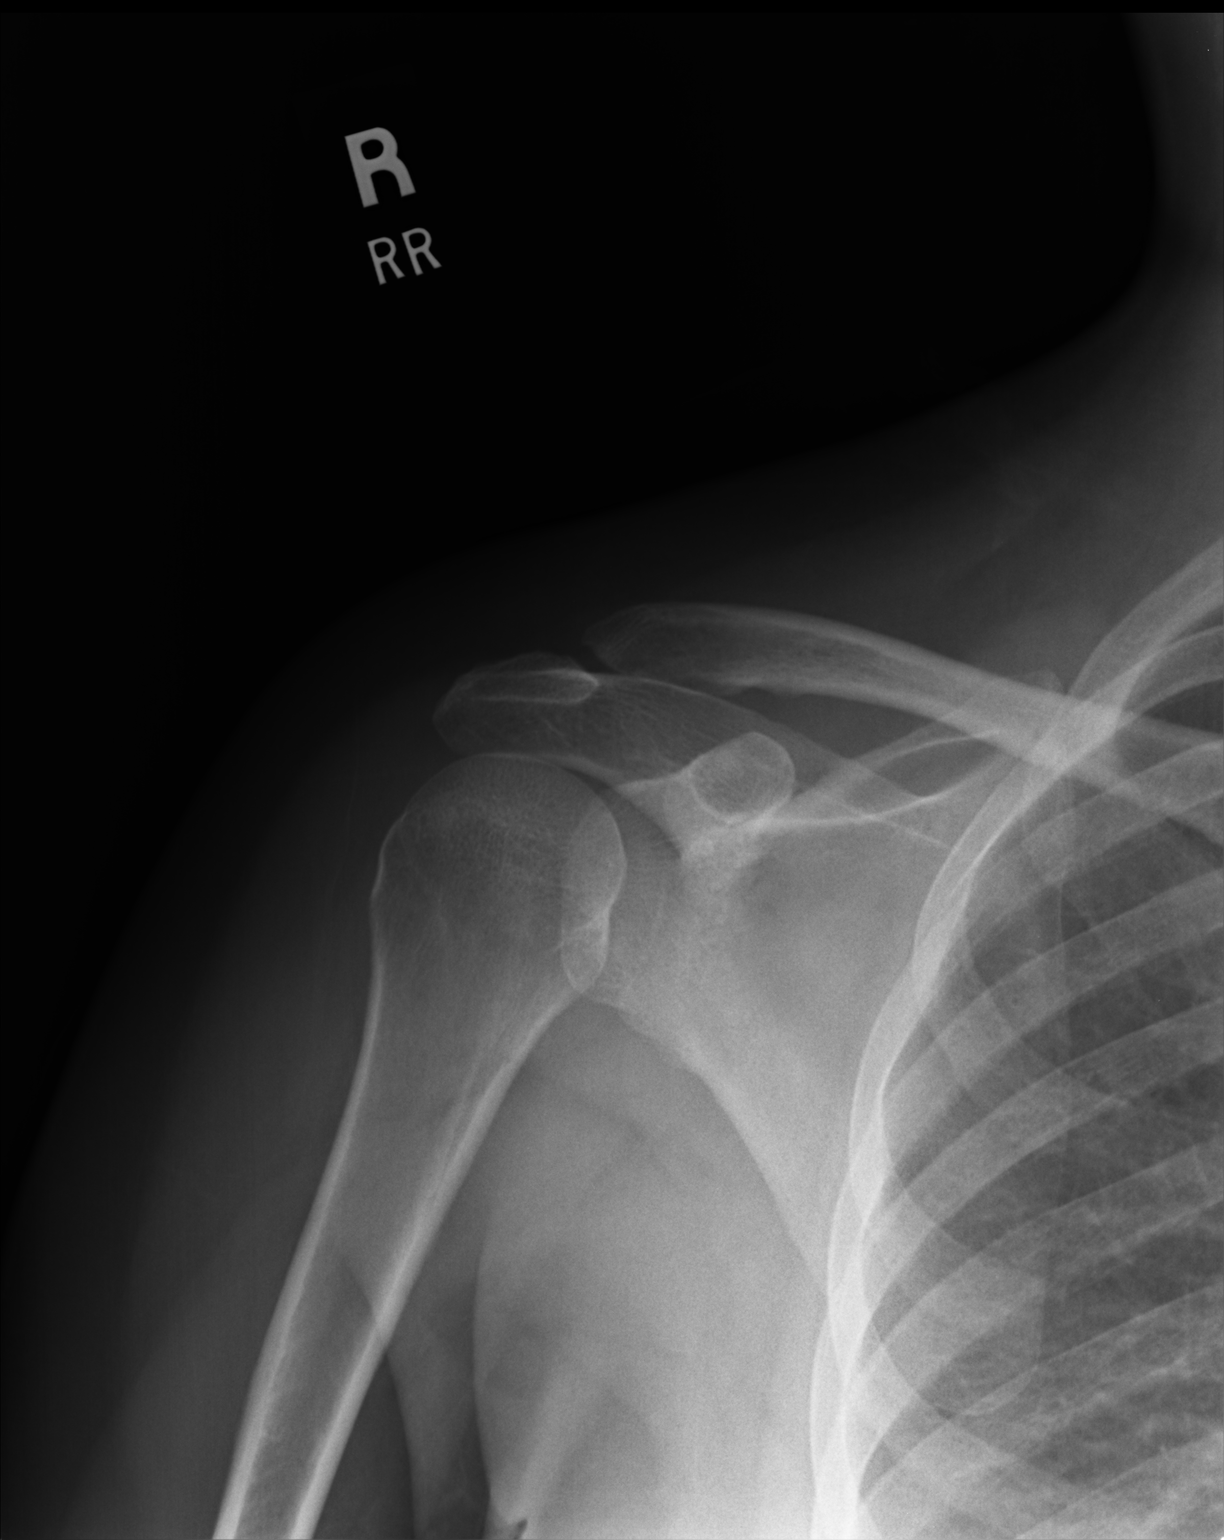

[pa y view]
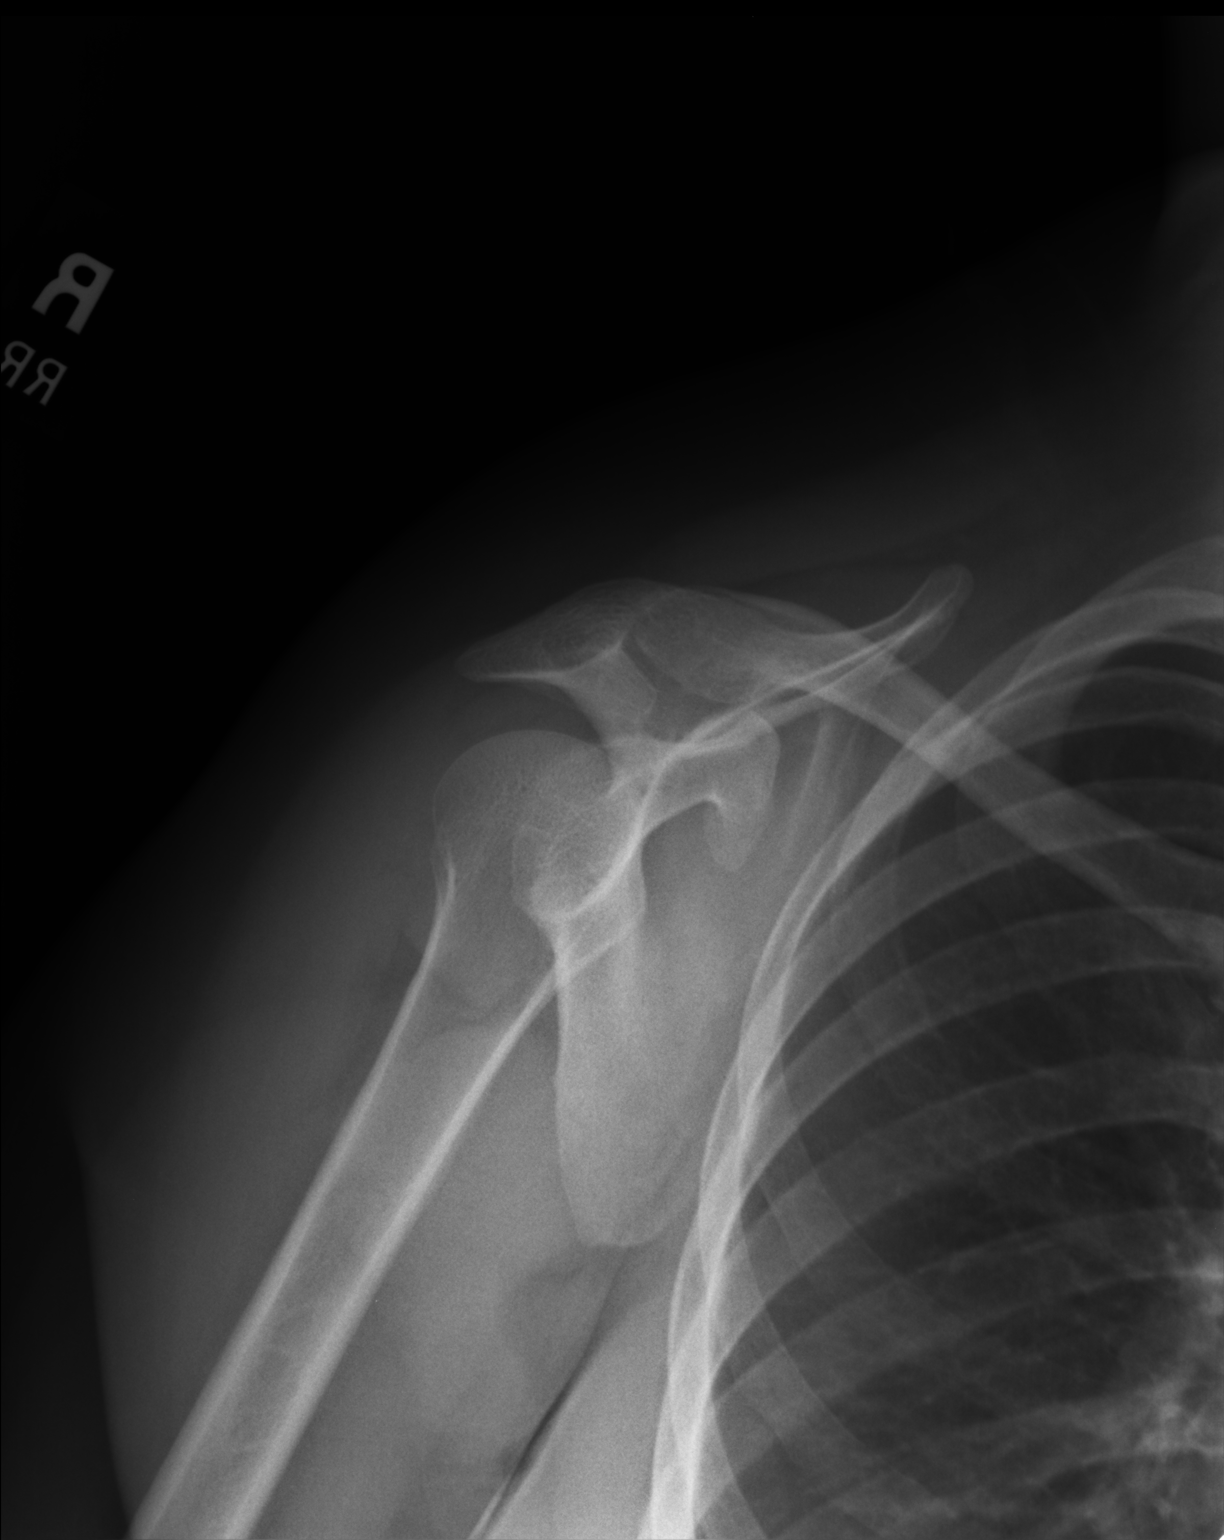

[axial]
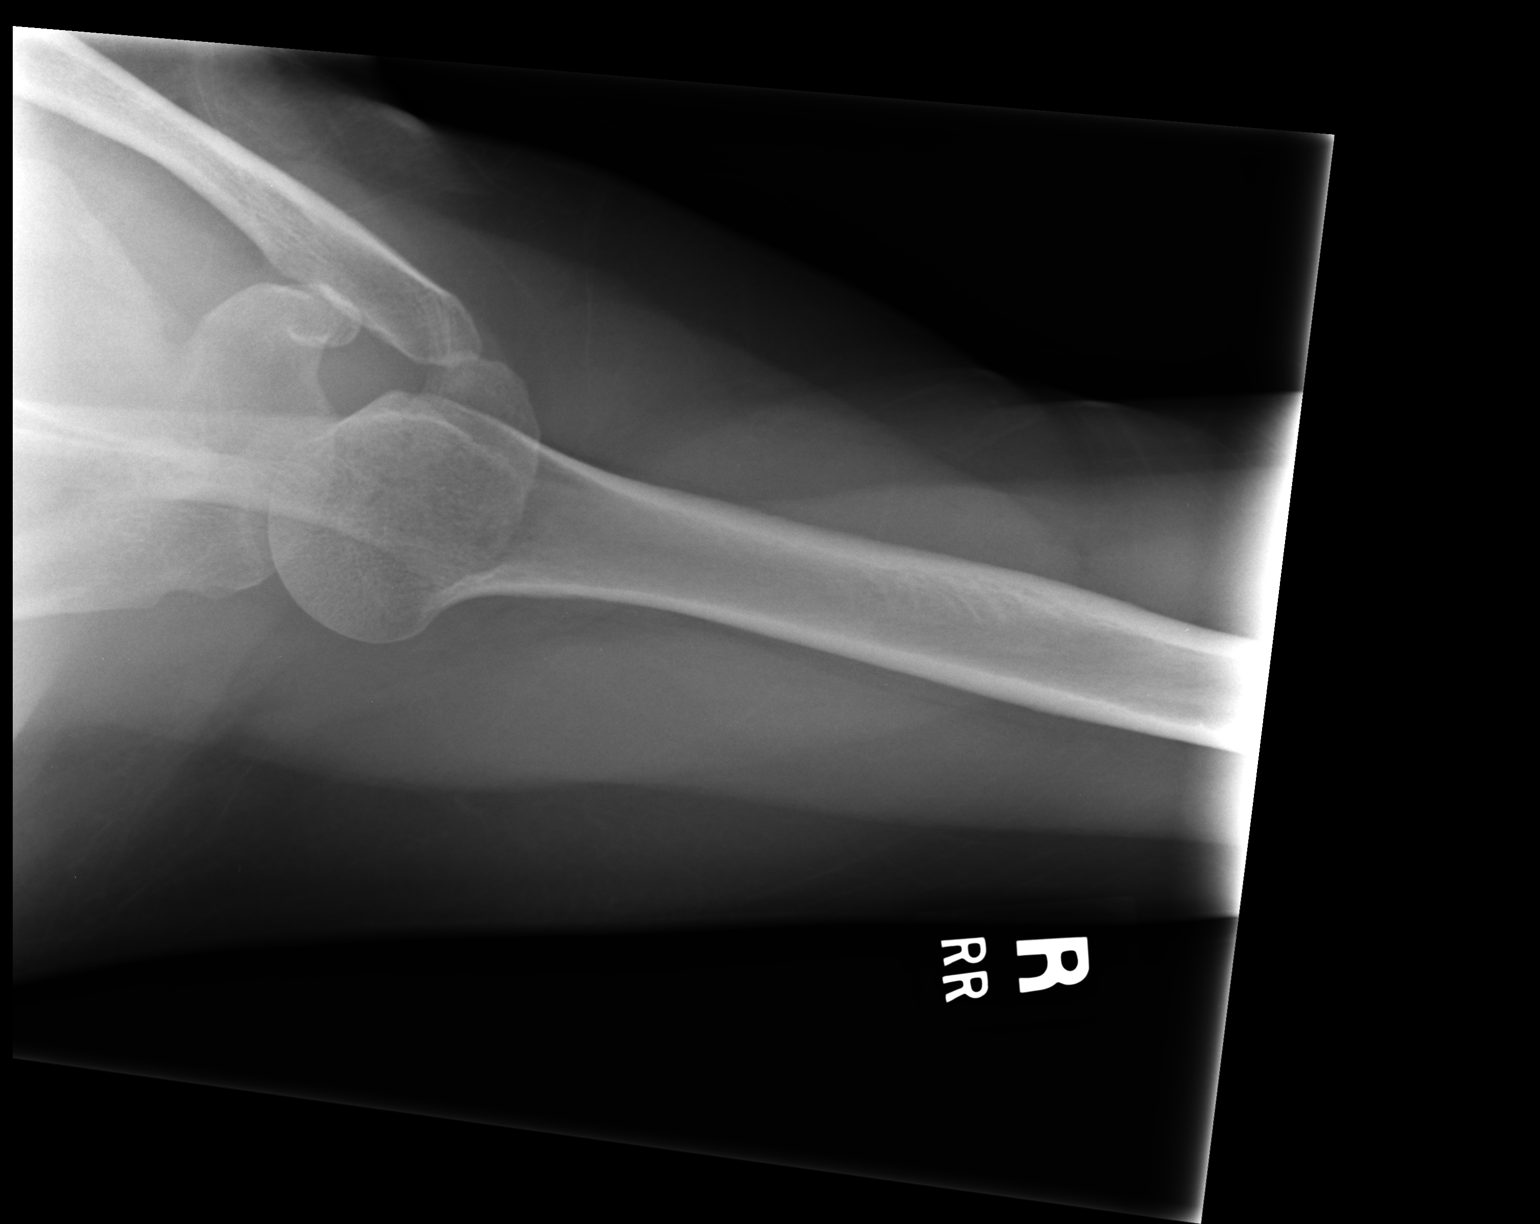

[3 of 3 positions shown; findings below may reference images not displayed]

FINDINGS: AC joint alignment normal.
Osseous mineralization grossly normal for technique.
No acute fracture, dislocation or bone destruction.
Visualized right ribs intact.
IMPRESSION: No acute osseous abnormalities.

## 2014-07-01 ENCOUNTER — Emergency Department (HOSPITAL_BASED_OUTPATIENT_CLINIC_OR_DEPARTMENT_OTHER)
Admission: EM | Admit: 2014-07-01 | Discharge: 2014-07-01 | Disposition: A | Discharge status: Home / Self Care | Payer: BLUE CROSS/BLUE SHIELD | Attending: Emergency Medicine | Admitting: Emergency Medicine

## 2014-07-01 ENCOUNTER — Emergency Department (HOSPITAL_BASED_OUTPATIENT_CLINIC_OR_DEPARTMENT_OTHER): Payer: BLUE CROSS/BLUE SHIELD

## 2014-07-01 ENCOUNTER — Encounter (HOSPITAL_BASED_OUTPATIENT_CLINIC_OR_DEPARTMENT_OTHER): Payer: Self-pay | Admitting: Emergency Medicine

## 2014-07-01 DIAGNOSIS — Z79899 Other long term (current) drug therapy: Secondary | ICD-10-CM | POA: Insufficient documentation

## 2014-07-01 DIAGNOSIS — Z87442 Personal history of urinary calculi: Secondary | ICD-10-CM | POA: Diagnosis not present

## 2014-07-01 DIAGNOSIS — Z8719 Personal history of other diseases of the digestive system: Secondary | ICD-10-CM | POA: Insufficient documentation

## 2014-07-01 DIAGNOSIS — F4310 Post-traumatic stress disorder, unspecified: Secondary | ICD-10-CM | POA: Diagnosis not present

## 2014-07-01 DIAGNOSIS — Z794 Long term (current) use of insulin: Secondary | ICD-10-CM | POA: Diagnosis not present

## 2014-07-01 DIAGNOSIS — F322 Major depressive disorder, single episode, severe without psychotic features: Secondary | ICD-10-CM | POA: Diagnosis not present

## 2014-07-01 DIAGNOSIS — Y998 Other external cause status: Secondary | ICD-10-CM | POA: Insufficient documentation

## 2014-07-01 DIAGNOSIS — M797 Fibromyalgia: Secondary | ICD-10-CM | POA: Insufficient documentation

## 2014-07-01 DIAGNOSIS — S9031XA Contusion of right foot, initial encounter: Secondary | ICD-10-CM | POA: Insufficient documentation

## 2014-07-01 DIAGNOSIS — Y9289 Other specified places as the place of occurrence of the external cause: Secondary | ICD-10-CM | POA: Diagnosis not present

## 2014-07-01 DIAGNOSIS — E1021 Type 1 diabetes mellitus with diabetic nephropathy: Secondary | ICD-10-CM | POA: Insufficient documentation

## 2014-07-01 DIAGNOSIS — W228XXA Striking against or struck by other objects, initial encounter: Secondary | ICD-10-CM | POA: Insufficient documentation

## 2014-07-01 DIAGNOSIS — F419 Anxiety disorder, unspecified: Secondary | ICD-10-CM | POA: Insufficient documentation

## 2014-07-01 DIAGNOSIS — Z7982 Long term (current) use of aspirin: Secondary | ICD-10-CM | POA: Diagnosis not present

## 2014-07-01 DIAGNOSIS — Z8679 Personal history of other diseases of the circulatory system: Secondary | ICD-10-CM | POA: Insufficient documentation

## 2014-07-01 DIAGNOSIS — S99921A Unspecified injury of right foot, initial encounter: Secondary | ICD-10-CM | POA: Diagnosis present

## 2014-07-01 DIAGNOSIS — M1990 Unspecified osteoarthritis, unspecified site: Secondary | ICD-10-CM | POA: Diagnosis not present

## 2014-07-01 DIAGNOSIS — Z8669 Personal history of other diseases of the nervous system and sense organs: Secondary | ICD-10-CM | POA: Diagnosis not present

## 2014-07-01 DIAGNOSIS — E785 Hyperlipidemia, unspecified: Secondary | ICD-10-CM | POA: Insufficient documentation

## 2014-07-01 DIAGNOSIS — Y9389 Activity, other specified: Secondary | ICD-10-CM | POA: Diagnosis not present
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
## 2014-07-01 NOTE — ED Provider Notes (Signed)
CSN: [id number]     Arrival date & time 07/01/14  1959 History   First MD Initiated Contact with Patient 07/01/14 2016     Chief Complaint  Patient presents with  . Foot Injury     (Consider location/radiation/quality/duration/timing/severity/associated sxs/prior Treatment) HPI   Blood pressure 123/56, pulse 88, temperature 98.7 F (37.1 C), temperature source Oral, resp. rate 18, SpO2 99 %.  Maureen [name] is a 44 y.o. female complaining of 7 of 10 pain to dorsum of right foot after she accidentally hit it on a step prior to arrival. Patient is ambulatory without issue. States that the pain is exacerbated by weightbearing. She taken acetaminophen at home with little relief. No prior history of trauma or surgeries to the affected area.  Past Medical History  Diagnosis Date  . 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 Fibromyalgia   . Hyperlipidemia   . Abdominal pain   . Nausea & vomiting   . Biliary dyskinesia   . Depression   . Joint pain   . Skin rash     due to medications  . Post traumatic stress disorder (PTSD)   . Sleep trouble   . Major depressive disorder   . Cervical strain   . Closed head injury 2010  . Renal stones   . Migraine     hx of none recent  . Arthritis   . Hypoglycemia   . Anxiety    Past Surgical History  Procedure Laterality Date  . Lipoma removed  yrs ago  . Knee surgery Left 2012  . Cesarean section  1997, 1999  . Cholecystectomy  02/10/2011    Procedure: LAPAROSCOPIC CHOLECYSTECTOMY WITH INTRAOPERATIVE CHOLANGIOGRAM;  Surgeon: Judieth Keens, DO;  Location: [facility];  Service: General;  Laterality: N/A;  . Robotic assisted laparoscopic lysis of adhesion N/A 03/04/2012    Procedure: ROBOTIC ASSISTED LAPAROSCOPIC LYSIS OF EXTENSIVE ADHESIONS;  Surgeon: Claiborne Billings A. Pamala Hurry, MD;  Location: [facility];  Service: Gynecology;  Laterality: N/A;  . Tonsillectomy  2001  . Abdominal hysterectomy  2001  . Eye surgery  2006     laser eye surgery left eye  . Exploratory laparomty  Mar 04, 2012  . Laparoscopic appendectomy N/A 10/12/2012    Procedure: DIAGNOSTIC APPENDECTOMY LAPAROSCOPIC;  Surgeon: Madilyn Hook, DO;  Location: [facility];  Service: General;  Laterality: N/A;  . Appendectomy     Family History  Problem Relation Age of Onset  . Cancer Father     lymphoma  . Nephrolithiasis Father   . Vascular Disease Father   . Alcohol abuse Father   . Drug abuse Father   . Cancer Maternal Grandmother     colon  . Hypertension Mother   . Heart disease Mother   . Cataracts Mother   . Depression Mother   . Diabetes Brother   . Drug abuse Brother   . Mental illness Maternal Grandfather   . Cancer Maternal Grandfather   . Heart disease Paternal Grandmother   . Heart disease Paternal Grandfather   . Suicidality Maternal Uncle    History  Substance Use Topics  . Smoking status: Never Smoker   . Smokeless tobacco: Never Used  . Alcohol Use: No   OB History    No data available     Review of Systems  10 systems reviewed and found to be negative, except as noted in the HPI.   Allergies  Meloxicam; Cephalexin; and Ibuprofen  Home Medications  Prior to Admission medications   Medication Sig Start Date End Date Taking? Authorizing Provider  aspirin EC 81 MG tablet Take 1 tablet (81 mg total) by mouth daily with breakfast. 10/16/13   Kerrie Buffalo, NP  atorvastatin (LIPITOR) 40 MG tablet Take 1 tablet (40 mg total) by mouth daily. Resume home meds for high cholesterol 10/16/13   Kerrie Buffalo, NP  cholecalciferol (VITAMIN D) 1000 UNITS tablet Take 1,000 Units by mouth daily.    Historical Provider, MD  diphenhydramine-acetaminophen (TYLENOL PM) 25-500 MG TABS Take 2 tablets by mouth at bedtime as needed (sleep and pain).    Historical Provider, MD  FLUoxetine (PROZAC) 40 MG capsule Take 1 capsule (40 mg total) by mouth daily. For depression 05/29/14   Charlcie Cradle, MD  insulin aspart (NOVOLOG) 100  UNIT/ML injection Inject 8 Units into the skin 3 (three) times daily with meals. 10/16/13   Kerrie Buffalo, NP  insulin glargine (LANTUS) 100 UNIT/ML injection Inject 26 Units into the skin at bedtime.     Historical Provider, MD  ondansetron (ZOFRAN ODT) 4 MG disintegrating tablet Take 1 tablet (4 mg total) by mouth every 8 (eight) hours as needed for nausea. 06/20/14   Larene Pickett, PA-C  oxyCODONE-acetaminophen (PERCOCET/ROXICET) 5-325 MG per tablet Take 1 tablet by mouth every 4 (four) hours as needed. 06/20/14   Larene Pickett, PA-C  QUEtiapine (SEROQUEL) 200 MG tablet Take 1 tablet (200 mg total) by mouth at bedtime. 05/29/14 05/29/15  Charlcie Cradle, MD   BP 123/56 mmHg  Pulse 88  Temp(Src) 98.7 F (37.1 C) (Oral)  Resp 18  SpO2 99% Physical Exam  Constitutional: She is oriented to person, place, and time. She appears well-developed and well-nourished. No distress.  HENT:  Head: Normocephalic.  Eyes: Conjunctivae and EOM are normal.  Cardiovascular: Normal rate.   Pulmonary/Chest: Effort normal. No stridor.  Musculoskeletal: Normal range of motion. She exhibits tenderness.  No ecchymoses abrasions or lacerations, swelling or erythema. Patient is mildly tender to palpation along the dorsum of the right third and fourth metatarsals. NVI  Neurological: She is alert and oriented to person, place, and time.  Psychiatric: She has a normal mood and affect.  Nursing note and vitals reviewed.   ED Course  Procedures (including critical care time) Labs Review Labs Reviewed - No data to display  Imaging Review Dg Foot Complete Right  07/01/2014   CLINICAL DATA:  Hit top of foot on step, with medial foot pain, radiating to the great toe. Initial encounter.  EXAM: RIGHT FOOT COMPLETE - 3+ VIEW  COMPARISON:  Right tibia/fibula radiographs performed 06/03/2007  FINDINGS: There is no evidence of fracture or dislocation. The joint spaces are preserved. There is no evidence of talar subluxation; the  subtalar joint is unremarkable in appearance. An os peroneum is noted. Mild pes planus is noted.  Diffuse vascular calcifications are seen.  IMPRESSION: 1. No evidence of fracture or dislocation. 2. Os peroneum noted. 3. Mild pes planus seen. 4. Diffuse vascular calcifications noted.   Electronically Signed   By: Garald Balding M.D.   On: 07/01/2014 20:39     EKG Interpretation None      MDM   Final diagnoses:  Foot contusion, right, initial encounter    Filed Vitals:   07/01/14 2004  BP: 123/56  Pulse: 88  Temp: 98.7 F (37.1 C)  TempSrc: Oral  Resp: 18  SpO2: 99%    Maureen [name] is a pleasant 44 y.o. female presenting  with moderate pain to dorsum of right foot after injection only evident on the steps earlier in the day. No objective signs of trauma, neurovascularly intact. X-rays negative for acute findings. Recommend rest, ice, compression elevation. Patient will be given crutches and Ace wrap.  Evaluation does not show pathology that would require ongoing emergent intervention or inpatient treatment. Pt is hemodynamically stable and mentating appropriately. Discussed findings and plan with patient/guardian, who agrees with care plan. All questions answered. Return precautions discussed and outpatient follow up given.     Monico Blitz, PA-C 07/01/14 2050  Leonard Schwartz, MD 07/01/14 [phone]

## 2014-07-01 NOTE — Discharge Instructions (Signed)
Rest, Ice intermittently (in the first 24-48 hours), Gentle compression with an Ace wrap, and elevate (Limb above the level of the heart)   Take acetaminophen (Tylenol) up to 975 mg (this is normally 3 over-the-counter pills) up to 3 times a day. Do not drink alcohol. Make sure your other medications do not contain acetaminophen (Read the labels!)  Please follow with your primary care doctor in the next 2 days for a check-up. They must obtain records for further management.   Do not hesitate to return to the Emergency Department for any new, worsening or concerning symptoms.   Contusion A contusion is a deep bruise. Contusions happen when an injury causes bleeding under the skin. Signs of bruising include pain, puffiness (swelling), and discolored skin. The contusion may turn blue, purple, or yellow. HOME CARE   Put ice on the injured area.  Put ice in a plastic bag.  Place a towel between your skin and the bag.  Leave the ice on for 15-20 minutes, 03-04 times a day.  Only take medicine as told by your doctor.  Rest the injured area.  If possible, raise (elevate) the injured area to lessen puffiness. GET HELP RIGHT AWAY IF:   You have more bruising or puffiness.  You have pain that is getting worse.  Your puffiness or pain is not helped by medicine. MAKE SURE YOU:   Understand these instructions.  Will watch your condition.  Will get help right away if you are not doing well or get worse. Document Released: 06/24/2007 Document Revised: 03/30/2011 Document Reviewed: 11/10/2010 [facility] Patient Information 2015 [place], [place]. This information is not intended to replace advice given to you by your health care provider. Make sure you discuss any questions you have with your health care provider.

## 2014-07-17 IMAGING — CR DG THORACIC SPINE 2V
1 series · 1 of 1 positions shown · non-contrast
Comparison: None

CLINICAL DATA: Back pain

THORACIC SPINE - 2 VIEW

[AP]
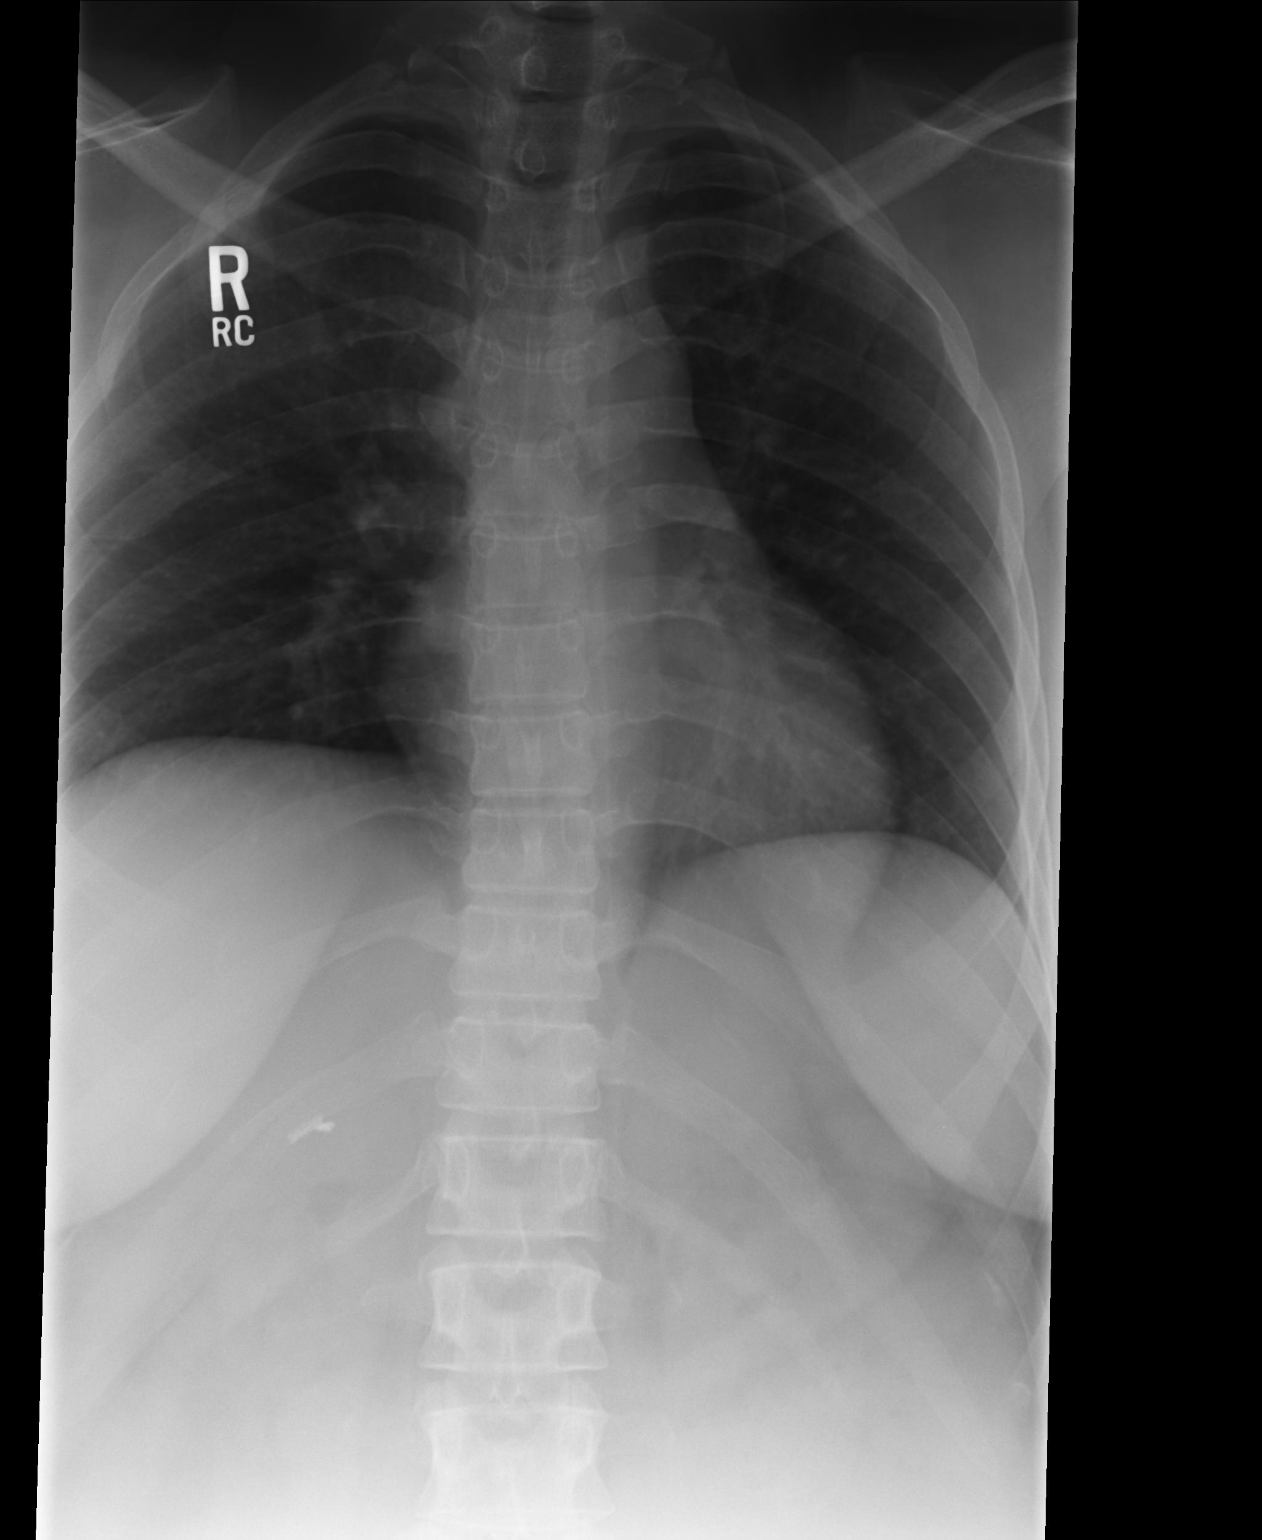

[1 of 1 positions shown; findings below may reference images not displayed]

FINDINGS: Negative for fracture or mass.  Mild thoracic disc
degeneration with mild disc space narrowing and minimal spurring.
No focal bony lesion.  Minimal scoliosis may be positional.
IMPRESSION: Negative

Clinically significant discrepancy from primary report, if
provided: None

## 2014-07-17 IMAGING — CR DG LUMBAR SPINE 2-3V
4 series · 4 of 4 positions shown · non-contrast
Comparison: None

CLINICAL DATA: Back pain

LUMBAR SPINE - 2-3 VIEW

[AP]
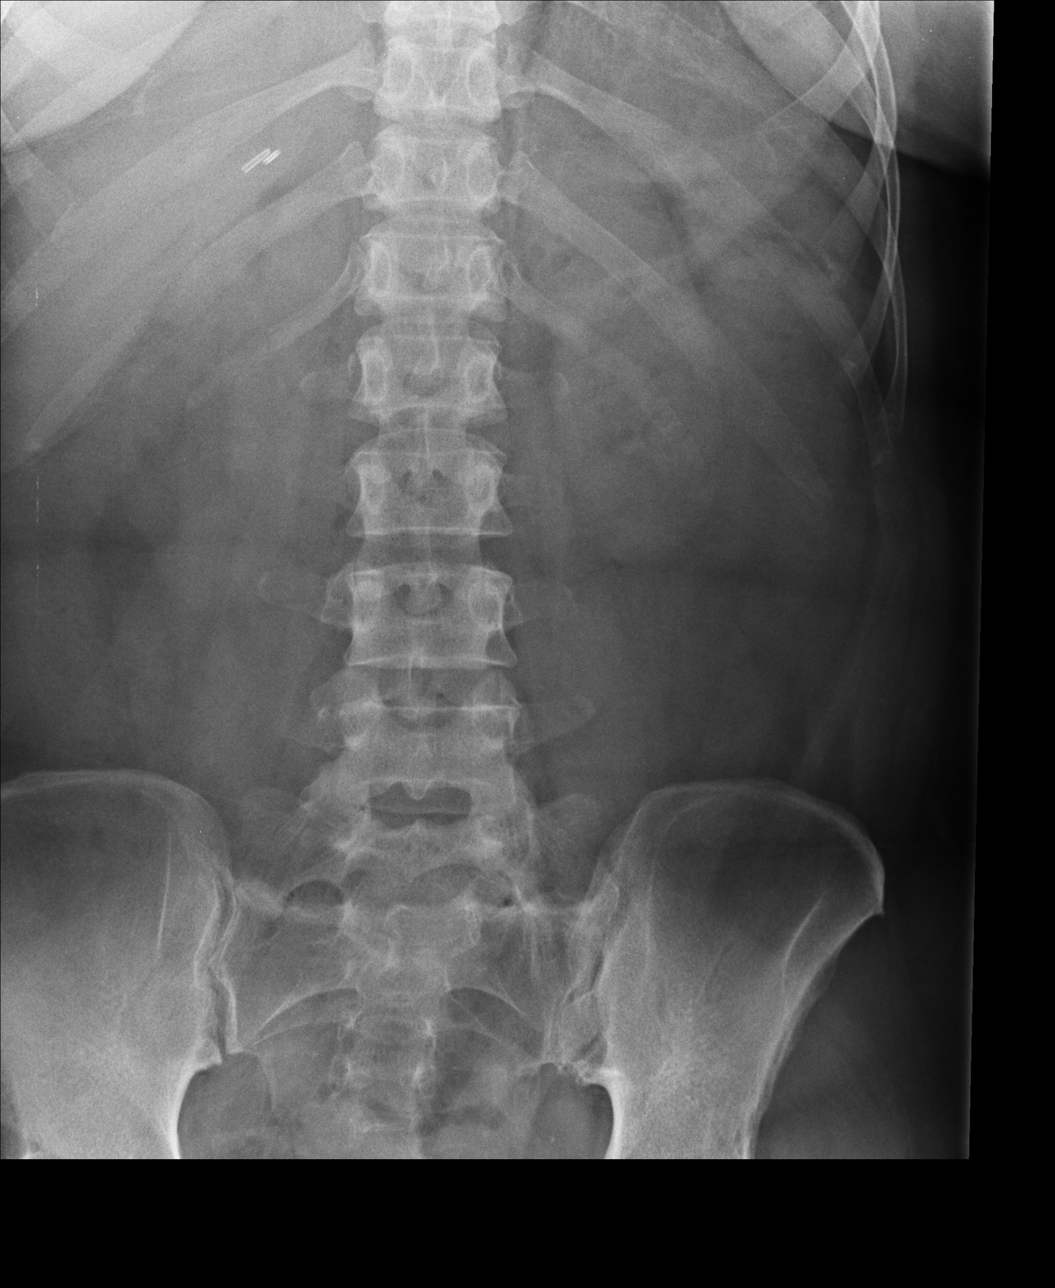

[lateral]
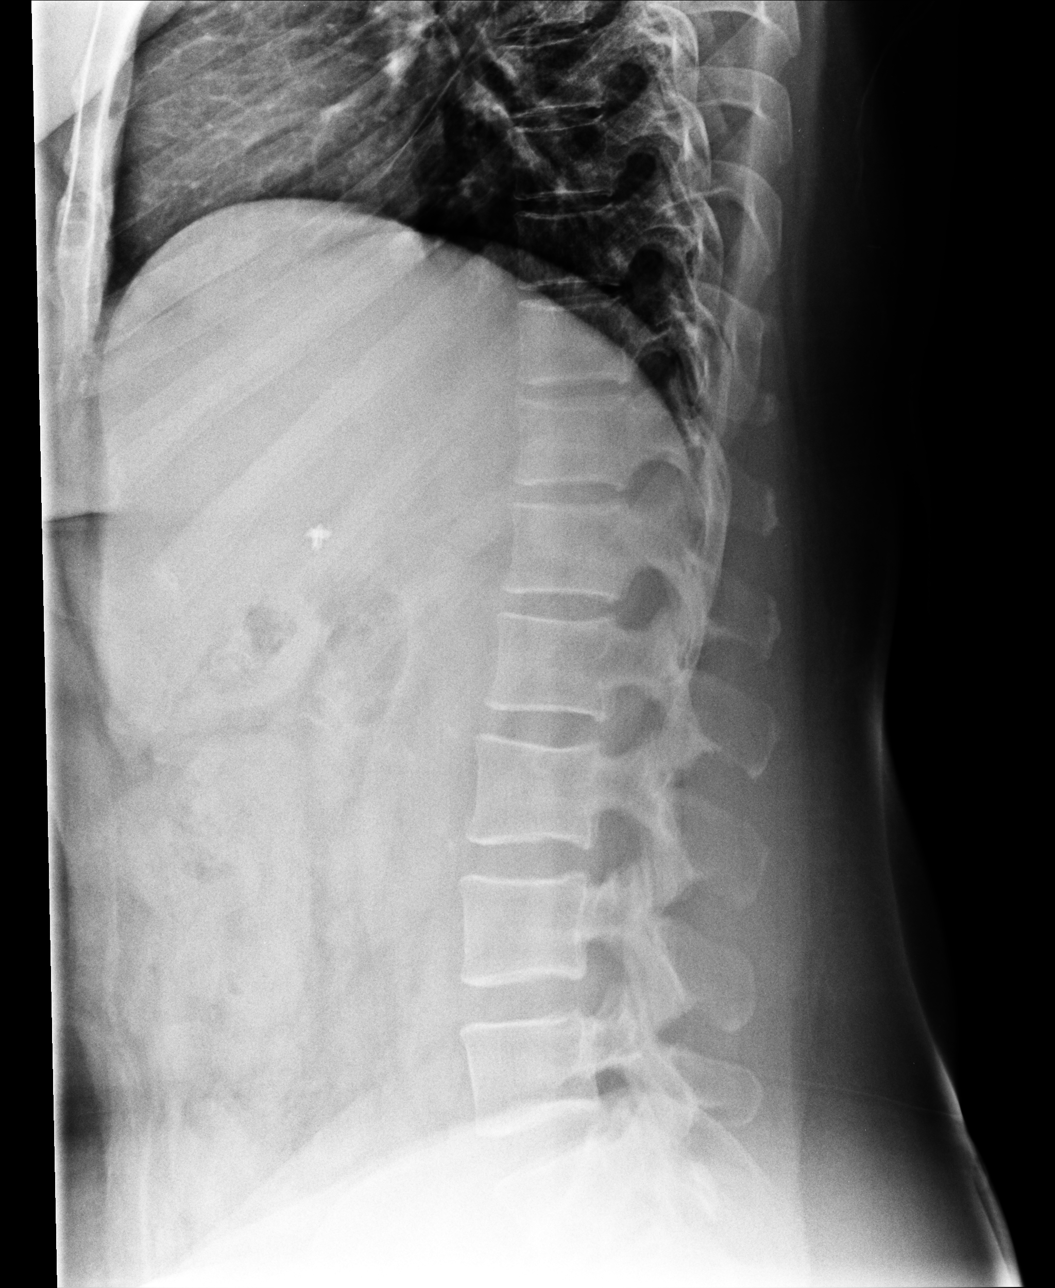

[l5 s1]
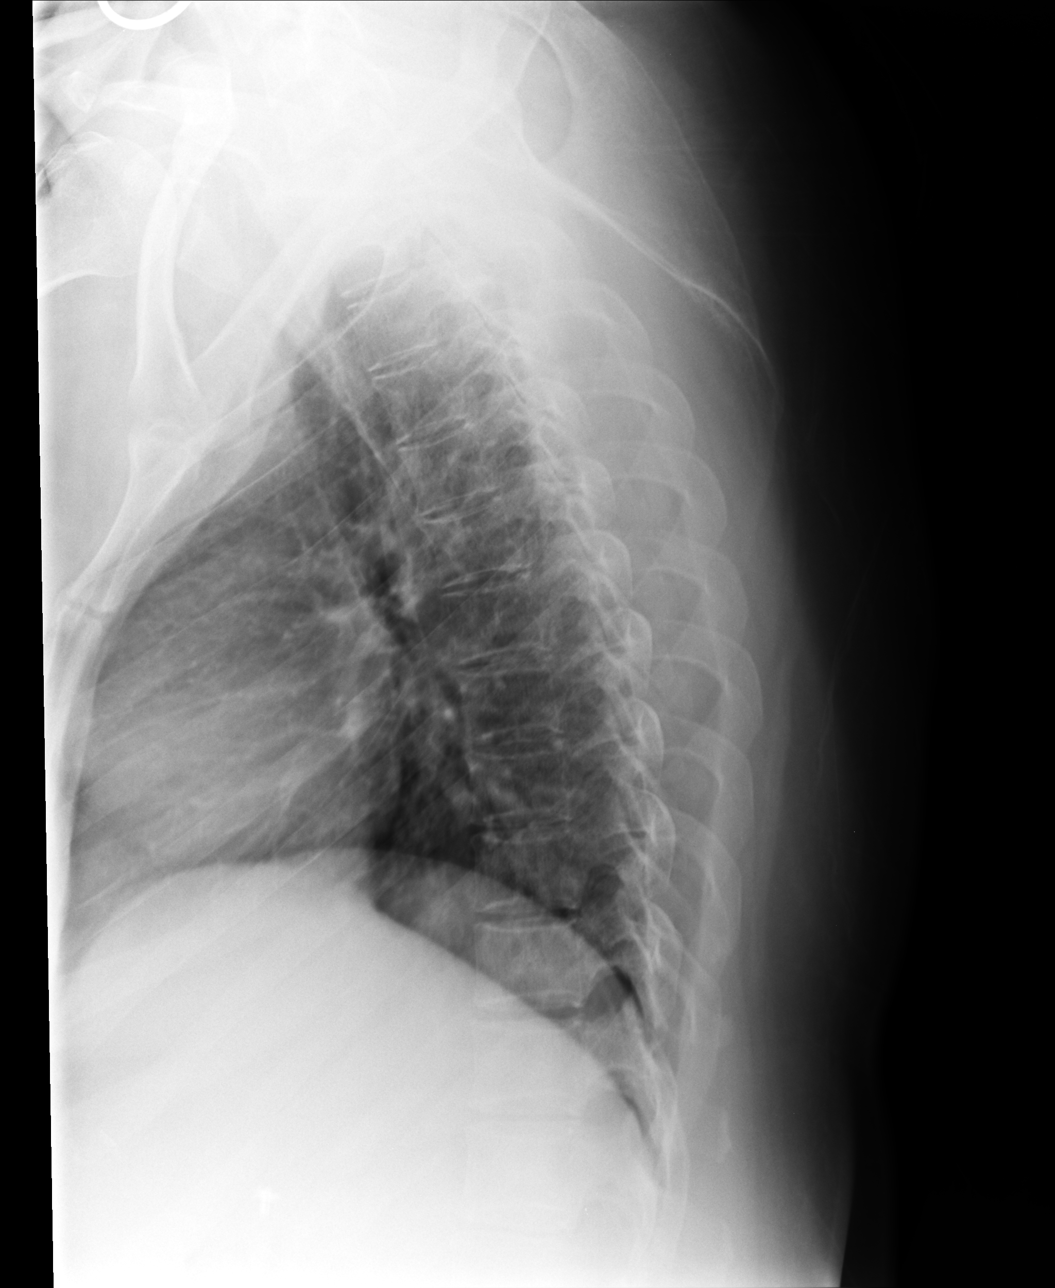

[rpo]
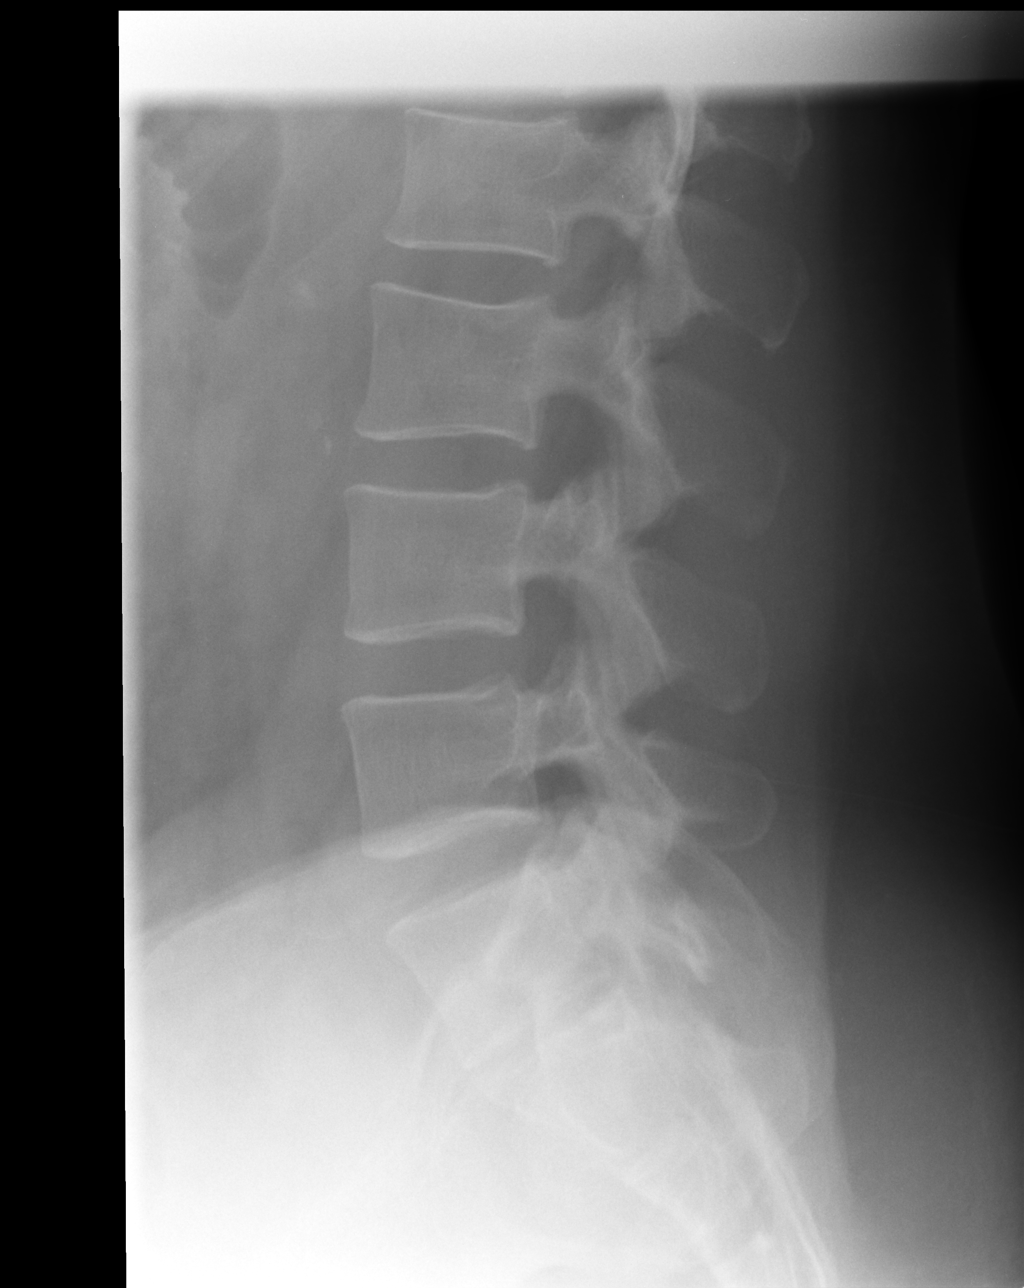

[4 of 4 positions shown; findings below may reference images not displayed]

FINDINGS: Normal alignment.  Negative for fracture or mass.  No
pars defect.  Disc spaces are normal.  Surgical clips in the
gallbladder fossa.

Transitional anatomy.  L5 is partially incorporated into the
sacrum.
IMPRESSION: Negative lumbar spine.

Clinically significant discrepancy from primary report, if
provided: None

## 2014-07-17 IMAGING — CT CT ABD-PELV W/O CM
2 of 4 series · 15 of 46 positions shown, 17 images · non-contrast
Comparison: 07/19/2012, 07/17/2011

CLINICAL DATA: Back pain, hematuria

CT ABDOMEN AND PELVIS WITHOUT CONTRAST
TECHNIQUE: Multidetector CT imaging of the abdomen and pelvis was
performed following the standard protocol without intravenous
contrast.

[Series 2: renal stone < 200 lbs 5.0 b31f · axial · 0.72mm/px · z∈[-498,-123]mm · 12 of 86 slices shown, 14 images]
[im 7/86  soft-tissue]
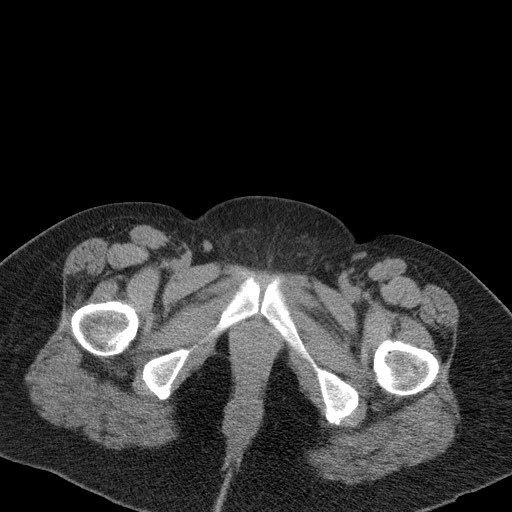
[im 7/86  bone]
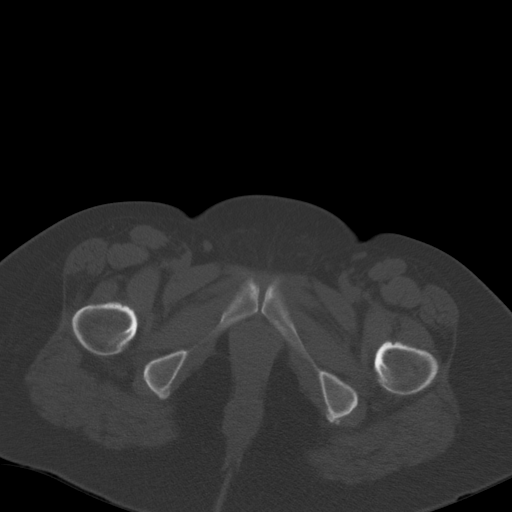
[im 14/86  soft-tissue]
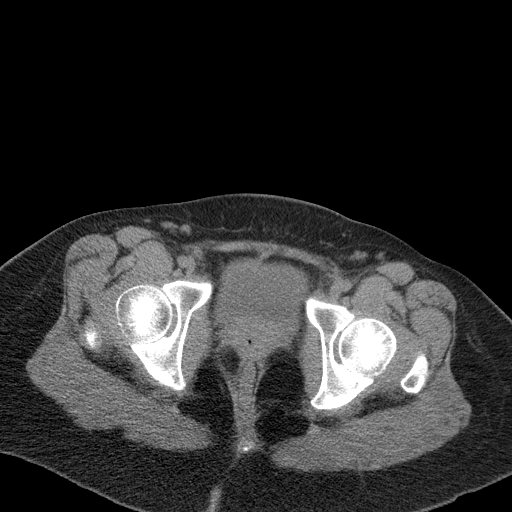
[im 21/86  soft-tissue]
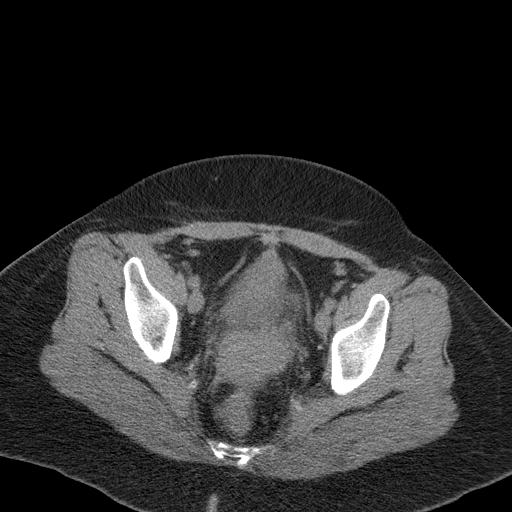
[im 28/86  soft-tissue]
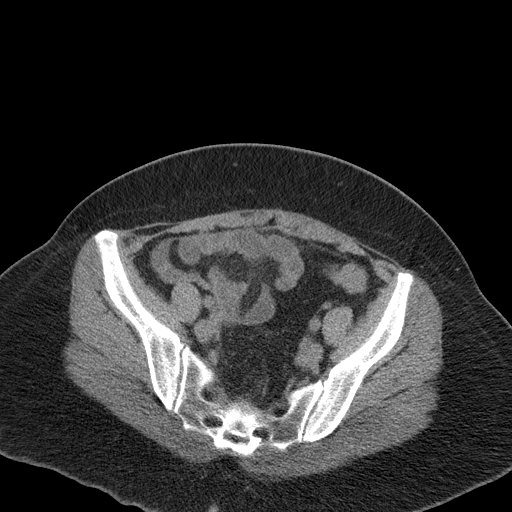
[im 35/86  soft-tissue]
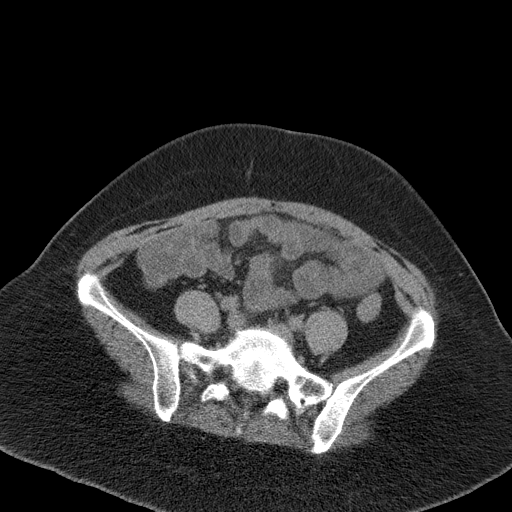
[im 41/86  soft-tissue]
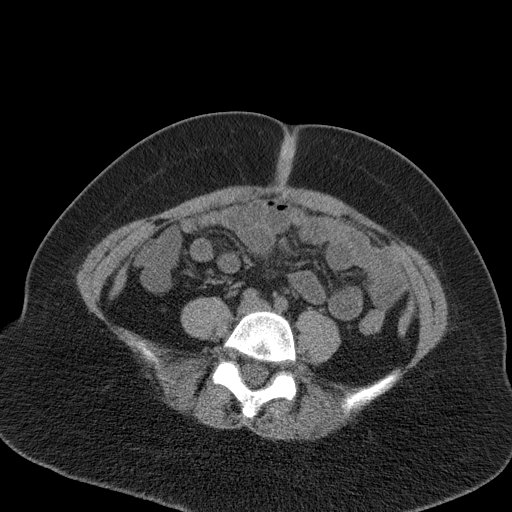
[im 48/86  soft-tissue]
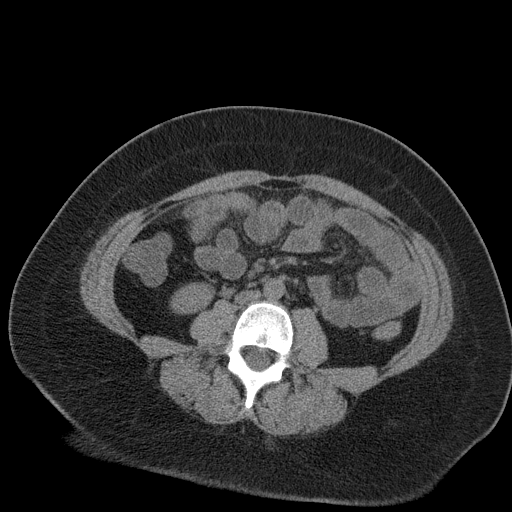
[im 55/86  soft-tissue]
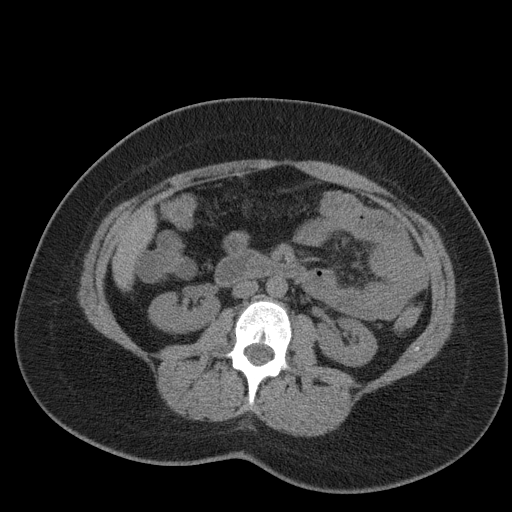
[im 62/86  soft-tissue]
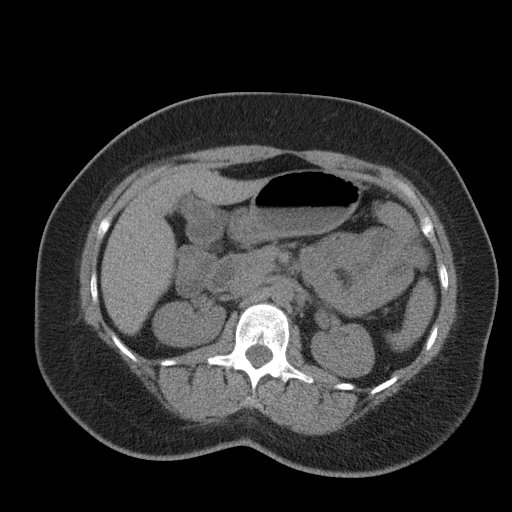
[im 62/86  bone]
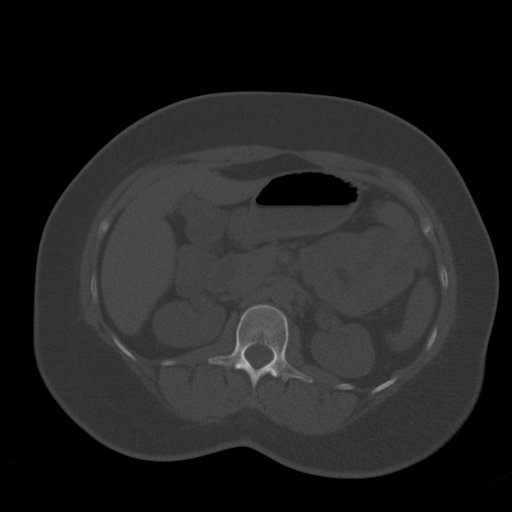
[im 69/86  soft-tissue]
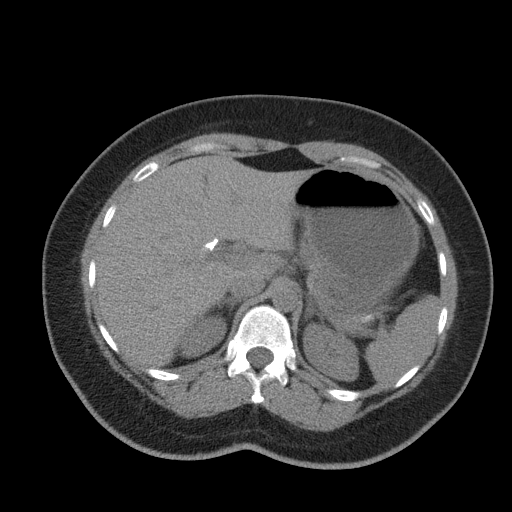
[im 75/86  soft-tissue]
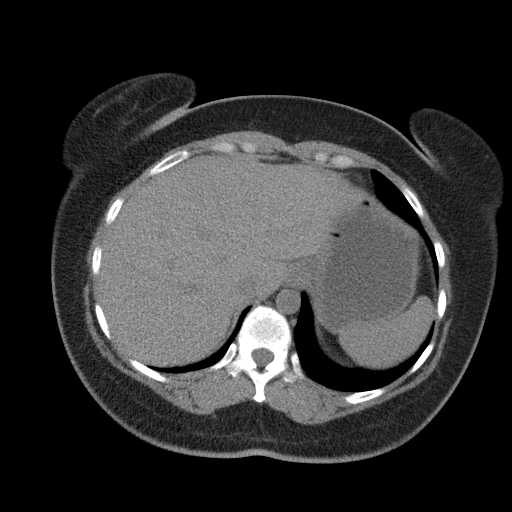
[im 82/86  soft-tissue]
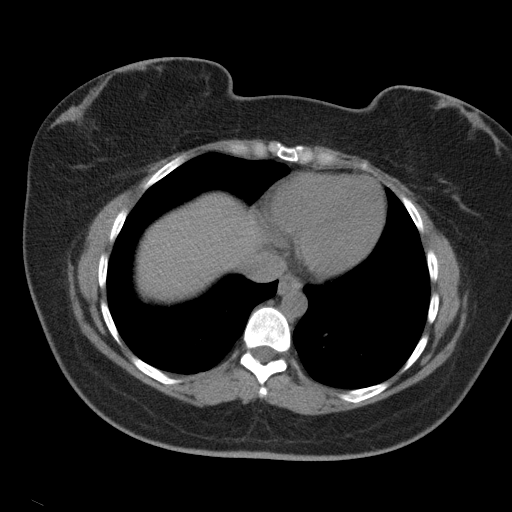

[Series 5: renal stone 3.0 coronal · coronal · 0.67mm/px · 3 of 102 slices shown]
[im 34/102  soft-tissue]
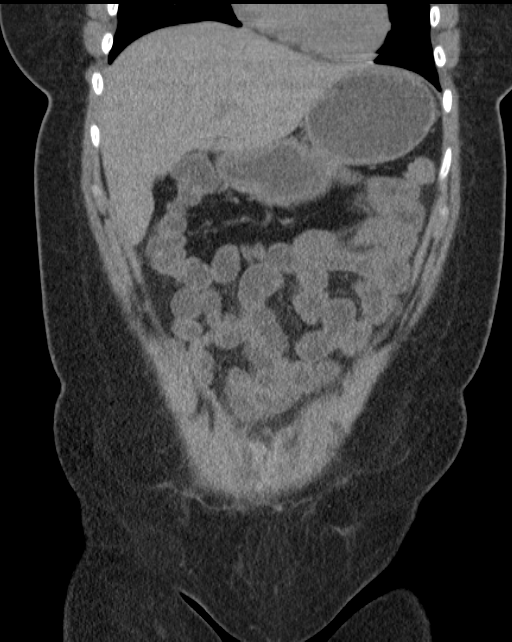
[im 45/102  soft-tissue]
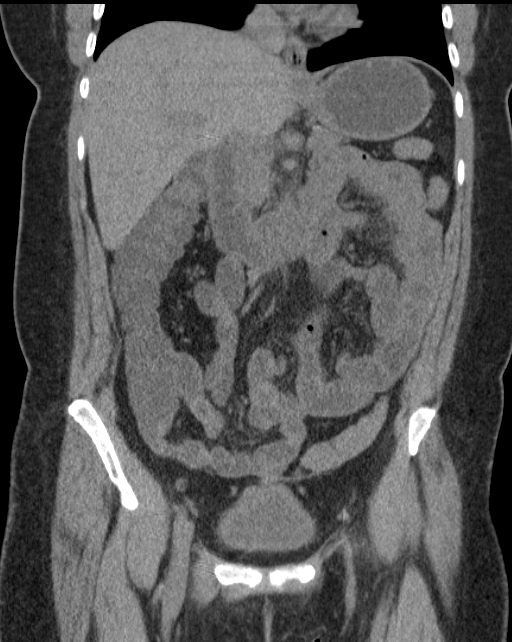
[im 57/102  soft-tissue]
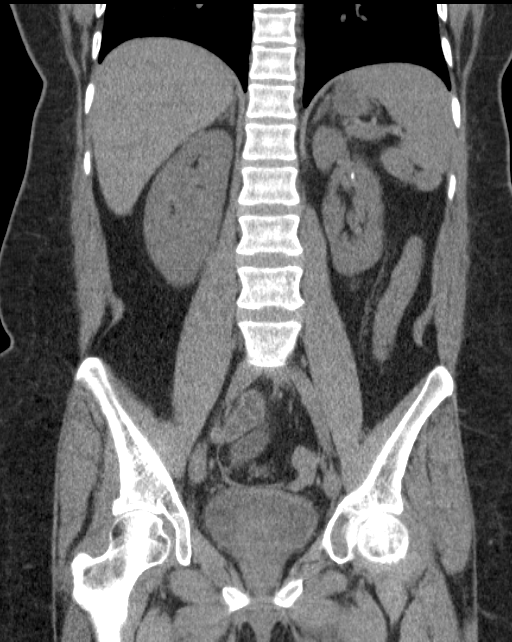

[15 of 46 positions shown; findings below may reference images not displayed]

FINDINGS: Limited images through the lung bases demonstrate no
significant appreciable abnormality. The heart size is within
normal limits. No pleural or pericardial effusion.

Organ abnormality/lesion detection is limited in the absence of
intravenous contrast. Within this limitation, unremarkable liver.
Cholecystectomy.  No biliary ductal dilatation.  Unremarkable
spleen, pancreas, adrenal glands.

Symmetric renal size.  No hydroureteronephrosis. The left renal
calcification is favored to be vascular though a tiny
nonobstructing stone is not excluded.  No ureteral calculi
identified.

Decompressed colon limits evaluation.  No overt colitis.  The
appendix is upper normal to mildly dilated at 7 mm as seen on
coronal image 47.  No periappendiceal inflammation.  Small bowel
loops are normal course and caliber.  No free intraperitoneal air.
Trace free fluid within the pelvis.  No lymphadenopathy. Mild
stranding within the small bowel root mesentery as seen on series 2
image 43.

There is scattered atherosclerotic calcification of the aorta and
its branches. No aneurysmal dilatation.

Circumferential bladder wall thickening, nonspecific given
incomplete distension.  There is nonspecific thickening at the
vaginal cuff with mild surrounding fat stranding.  Nodular soft
tissue along the ventral peritoneal surface at the level of the
bladder image 66 series 2 is similar to prior.

No acute osseous finding.
IMPRESSION: No ureteral calculi or hydroureteronephrosis.

Appendix upper normal to mildly prominent in diameter, similar to
prior.  No periappendiceal  fat stranding to favor acute
inflammation.  Correlate clinically.

Mild fat stranding within the pelvis abuts the vaginal cuff and
posterior bladder wall.  Correlate clinically for pelvic
inflammatory/infectious conditions or cystitis.

Unchanged nodular soft tissue along the midline anterior pelvic
wall.  This is favored to reflect scar tissue in the setting of
prior C-sections and hysterectomy.  Endometrial implant if there is
a history of endometriosis is also a consideration; appears similar
to priors.

Mild mesenteric root fat stranding may be tracking superiorly from
the pelvic process or reflect a nonspecific mesenteritis or
enteritis.

## 2014-07-27 ENCOUNTER — Emergency Department (HOSPITAL_COMMUNITY)
Admission: EM | Admit: 2014-07-27 | Discharge: 2014-07-28 | Disposition: A | Discharge status: Home / Self Care | Payer: BLUE CROSS/BLUE SHIELD | Attending: Emergency Medicine | Admitting: Emergency Medicine

## 2014-07-27 ENCOUNTER — Encounter (HOSPITAL_COMMUNITY): Payer: Self-pay | Admitting: Emergency Medicine

## 2014-07-27 DIAGNOSIS — M1990 Unspecified osteoarthritis, unspecified site: Secondary | ICD-10-CM | POA: Insufficient documentation

## 2014-07-27 DIAGNOSIS — E785 Hyperlipidemia, unspecified: Secondary | ICD-10-CM | POA: Diagnosis not present

## 2014-07-27 DIAGNOSIS — R52 Pain, unspecified: Secondary | ICD-10-CM

## 2014-07-27 DIAGNOSIS — Z794 Long term (current) use of insulin: Secondary | ICD-10-CM | POA: Diagnosis not present

## 2014-07-27 DIAGNOSIS — F419 Anxiety disorder, unspecified: Secondary | ICD-10-CM | POA: Diagnosis not present

## 2014-07-27 DIAGNOSIS — F329 Major depressive disorder, single episode, unspecified: Secondary | ICD-10-CM | POA: Insufficient documentation

## 2014-07-27 DIAGNOSIS — Z7982 Long term (current) use of aspirin: Secondary | ICD-10-CM | POA: Insufficient documentation

## 2014-07-27 DIAGNOSIS — G43909 Migraine, unspecified, not intractable, without status migrainosus: Secondary | ICD-10-CM | POA: Insufficient documentation

## 2014-07-27 DIAGNOSIS — R358 Other polyuria: Secondary | ICD-10-CM | POA: Diagnosis not present

## 2014-07-27 DIAGNOSIS — R739 Hyperglycemia, unspecified: Secondary | ICD-10-CM

## 2014-07-27 DIAGNOSIS — R0602 Shortness of breath: Secondary | ICD-10-CM | POA: Diagnosis present

## 2014-07-27 DIAGNOSIS — E1065 Type 1 diabetes mellitus with hyperglycemia: Secondary | ICD-10-CM | POA: Diagnosis not present

## 2014-07-27 DIAGNOSIS — E1021 Type 1 diabetes mellitus with diabetic nephropathy: Secondary | ICD-10-CM | POA: Insufficient documentation

## 2014-07-27 DIAGNOSIS — J029 Acute pharyngitis, unspecified: Secondary | ICD-10-CM | POA: Diagnosis not present

## 2014-07-27 LAB — COMPREHENSIVE METABOLIC PANEL
ALT: 17 U/L (ref 14–54)
AST: 16 U/L (ref 15–41)
Albumin: 3.6 g/dL (ref 3.5–5.0)
Alkaline Phosphatase: 152 U/L — ABNORMAL HIGH (ref 38–126)
Anion gap: 8 (ref 5–15)
BUN: 11 mg/dL (ref 6–20)
CO2: 29 mmol/L (ref 22–32)
Calcium: 9.1 mg/dL (ref 8.9–10.3)
Chloride: 95 mmol/L — ABNORMAL LOW (ref 101–111)
Creatinine, Ser: 1.23 mg/dL — ABNORMAL HIGH (ref 0.44–1.00)
GFR calc Af Amer: >60 mL/min (ref >60)
GFR calc non Af Amer: 54 mL/min — ABNORMAL LOW (ref >60)
Glucose, Bld: 484 mg/dL — ABNORMAL HIGH (ref 65–99)
Potassium: 5 mmol/L (ref 3.5–5.1)
Sodium: 132 mmol/L — ABNORMAL LOW (ref 135–145)
Total Bilirubin: 0.5 mg/dL (ref 0.3–1.2)
Total Protein: 6.8 g/dL (ref 6.5–8.1)

## 2014-07-27 LAB — URINALYSIS, ROUTINE W REFLEX MICROSCOPIC
Bilirubin Urine: NEGATIVE
Glucose, UA: >1000 mg/dL — AB
Hgb urine dipstick: NEGATIVE
Ketones, ur: NEGATIVE mg/dL
Leukocytes, UA: NEGATIVE
Nitrite: NEGATIVE
Protein, ur: NEGATIVE mg/dL
Specific Gravity, Urine: 1.036 — ABNORMAL HIGH (ref 1.005–1.030)
Urobilinogen, UA: 0.2 mg/dL (ref 0.0–1.0)
pH: 5 (ref 5.0–8.0)

## 2014-07-27 LAB — URINE MICROSCOPIC-ADD ON

## 2014-07-27 LAB — CBC
HCT: 41.5 % (ref 36.0–46.0)
Hemoglobin: 14.3 g/dL (ref 12.0–15.0)
MCH: 32.6 pg (ref 26.0–34.0)
MCHC: 34.5 g/dL (ref 30.0–36.0)
MCV: 94.5 fL (ref 78.0–100.0)
Platelets: 347 10*3/uL (ref 150–400)
RBC: 4.39 MIL/uL (ref 3.87–5.11)
RDW: 11.9 % (ref 11.5–15.5)
WBC: 10 10*3/uL (ref 4.0–10.5)

## 2014-07-27 LAB — CBG MONITORING, ED
Glucose-Capillary: 394 mg/dL — ABNORMAL HIGH (ref 65–99)
Glucose-Capillary: 432 mg/dL — ABNORMAL HIGH (ref 65–99)

## 2014-07-27 MED ORDER — SODIUM CHLORIDE 0.9 % IV BOLUS (SEPSIS)
2000.0000 mL | Freq: Once | INTRAVENOUS | Status: AC
  Administered 2014-07-28: 2000 mL via INTRAVENOUS

## 2014-07-27 NOTE — ED Notes (Signed)
Patient presents tonight with complaint of hyperglycemia, shortness of breath, dizziness, nausea. Hx DM1. States blood glucose readings have been climbing today; most recent was 412. Has been attempting to correct at home with insulin, but CBG continued to climb. Via home test patient reports ketones in urine.

## 2014-07-28 ENCOUNTER — Emergency Department (HOSPITAL_COMMUNITY): Payer: BLUE CROSS/BLUE SHIELD

## 2014-07-28 LAB — I-STAT VENOUS BLOOD GAS, ED
Acid-Base Excess: 8 mmol/L — ABNORMAL HIGH (ref 0.0–2.0)
Bicarbonate: 35 mEq/L — ABNORMAL HIGH (ref 20.0–24.0)
O2 Saturation: 49 %
TCO2: 37 mmol/L (ref 0–100)
pCO2, Ven: 58.6 mmHg — ABNORMAL HIGH (ref 45.0–50.0)
pH, Ven: 7.384 — ABNORMAL HIGH (ref 7.250–7.300)
pO2, Ven: 28 mmHg — CL (ref 30.0–45.0)

## 2014-07-28 LAB — CBG MONITORING, ED
Glucose-Capillary: 294 mg/dL — ABNORMAL HIGH (ref 65–99)
Glucose-Capillary: 295 mg/dL — ABNORMAL HIGH (ref 65–99)
Glucose-Capillary: 187 mg/dL — ABNORMAL HIGH (ref 65–99)
Glucose-Capillary: 73 mg/dL (ref 65–99)

## 2014-07-28 MED ORDER — SODIUM CHLORIDE 0.9 % IV SOLN
INTRAVENOUS | Status: DC
  Filled 2014-07-28: qty 2.5

## 2014-07-28 MED ORDER — INSULIN ASPART 100 UNIT/ML ~~LOC~~ SOLN
8.0000 [IU] | SUBCUTANEOUS | Status: AC
  Administered 2014-07-28: 8 [IU] via SUBCUTANEOUS
  Filled 2014-07-28: qty 1

## 2014-07-28 MED ORDER — SODIUM CHLORIDE 0.9 % IV BOLUS (SEPSIS): 1000.0000 mL | Freq: Once | INTRAVENOUS | Status: DC

## 2014-07-28 MED ORDER — DEXTROSE-NACL 5-0.45 % IV SOLN: INTRAVENOUS | Status: DC

## 2014-07-28 MED ORDER — HYDROCODONE-ACETAMINOPHEN 5-325 MG PO TABS
1.0000 | ORAL_TABLET | Freq: Once | ORAL | Status: AC
  Administered 2014-07-28: 1 via ORAL
  Filled 2014-07-28: qty 1

## 2014-07-28 MED ORDER — HYDROCODONE-ACETAMINOPHEN 5-325 MG PO TABS: 1.0000 | ORAL_TABLET | Freq: Four times a day (QID) | ORAL | Status: DC | PRN

## 2014-07-28 NOTE — ED Notes (Signed)
NP notified of Pt BS after 2L fluid

## 2014-07-28 NOTE — ED Provider Notes (Signed)
CSN: [id number]     Arrival date & time 07/27/14  2122 History   First MD Initiated Contact with Patient 07/28/14 0001     Chief Complaint  Patient presents with  . Shortness of Breath  . Hyperglycemia  . Polyuria     (Consider location/radiation/quality/duration/timing/severity/associated sxs/prior Treatment) HPI Comments: Patient states she is an insulin-dependent type I diabetic for many years.  Denies any recent illnesses or fevers.  States that today her blood sugar has gradually been climbing.  She tried increasing her oral fluids.  She did give herself an extra 8 units of insulin without improvement in her blood sugars.  She states she has been stressed recently and this will occasionally make her blood sugar elevate  Patient is a 44 y.o. female presenting with shortness of breath and hyperglycemia. The history is provided by the patient.  Shortness of Breath Severity:  Mild Onset quality:  Gradual Duration:  1 day Timing:  Constant Progression:  Unchanged Chronicity:  New Context: activity   Relieved by:  Nothing Worsened by:  Nothing tried Associated symptoms: sore throat   Associated symptoms: no abdominal pain, no chest pain, no cough, no diaphoresis, no fever, no headaches, no syncope and no vomiting   Hyperglycemia Severity:  Moderate Onset quality:  Gradual Duration:  1 day Timing:  Constant Progression:  Worsening Chronicity:  Recurrent Diabetes status:  Controlled with insulin Current diabetic therapy:  Insulin Context: not change in medication (4), not insulin pump use, not new diabetes diagnosis, not noncompliance, not recent change in diet and not recent illness   Relieved by:  Nothing Ineffective treatments:  Insulin (Increase in by mouth fluids) Associated symptoms: altered mental status, dizziness and shortness of breath   Associated symptoms: no abdominal pain, no blurred vision, no chest pain, no confusion, no dehydration, no diaphoresis, no dysuria, no  fatigue, no fever, no increased appetite, no increased thirst, no malaise, no nausea, no polyuria, no syncope, no vomiting, no weakness and no weight change     Past Medical History  Diagnosis Date  . 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 Fibromyalgia   . Hyperlipidemia   . Abdominal pain   . Nausea & vomiting   . Biliary dyskinesia   . Depression   . Joint pain   . Skin rash     due to medications  . Post traumatic stress disorder (PTSD)   . Sleep trouble   . Major depressive disorder   . Cervical strain   . Closed head injury 2010  . Renal stones   . Migraine     hx of none recent  . Arthritis   . Hypoglycemia   . Anxiety    Past Surgical History  Procedure Laterality Date  . Lipoma removed  yrs ago  . Knee surgery Left 2012  . Cesarean section  1997, 1999  . Cholecystectomy  02/10/2011    Procedure: LAPAROSCOPIC CHOLECYSTECTOMY WITH INTRAOPERATIVE CHOLANGIOGRAM;  Surgeon: Judieth Keens, DO;  Location: [facility];  Service: General;  Laterality: N/A;  . Robotic assisted laparoscopic lysis of adhesion N/A 03/04/2012    Procedure: ROBOTIC ASSISTED LAPAROSCOPIC LYSIS OF EXTENSIVE ADHESIONS;  Surgeon: Claiborne Billings A. Pamala Hurry, MD;  Location: [facility];  Service: Gynecology;  Laterality: N/A;  . Tonsillectomy  2001  . Abdominal hysterectomy  2001  . Eye surgery  2006    laser eye surgery left eye  . Exploratory laparomty  Mar 04, 2012  . Laparoscopic appendectomy  N/A 10/12/2012    Procedure: DIAGNOSTIC APPENDECTOMY LAPAROSCOPIC;  Surgeon: Madilyn Hook, DO;  Location: [facility];  Service: General;  Laterality: N/A;  . Appendectomy     Family History  Problem Relation Age of Onset  . Cancer Father     lymphoma  . Nephrolithiasis Father   . Vascular Disease Father   . Alcohol abuse Father   . Drug abuse Father   . Cancer Maternal Grandmother     colon  . Hypertension Mother   . Heart disease Mother   . Cataracts Mother   . Depression Mother   .  Diabetes Brother   . Drug abuse Brother   . Mental illness Maternal Grandfather   . Cancer Maternal Grandfather   . Heart disease Paternal Grandmother   . Heart disease Paternal Grandfather   . Suicidality Maternal Uncle    History  Substance Use Topics  . Smoking status: Never Smoker   . Smokeless tobacco: Never Used  . Alcohol Use: No   OB History    No data available     Review of Systems  Constitutional: Negative for fever, diaphoresis and fatigue.  HENT: Positive for sore throat. Negative for rhinorrhea and trouble swallowing.   Eyes: Negative for blurred vision.  Respiratory: Positive for shortness of breath. Negative for cough.   Cardiovascular: Negative for chest pain and syncope.  Gastrointestinal: Negative for nausea, vomiting and abdominal pain.  Endocrine: Negative for polydipsia and polyuria.  Genitourinary: Negative for dysuria.  Musculoskeletal: Negative for myalgias.  Skin: Negative for wound.  Neurological: Positive for dizziness. Negative for weakness and headaches.  Psychiatric/Behavioral: Negative for confusion.  All other systems reviewed and are negative.     Allergies  Meloxicam; Cephalexin; and Ibuprofen  Home Medications   Prior to Admission medications   Medication Sig Start Date End Date Taking? Authorizing Provider  aspirin EC 81 MG tablet Take 1 tablet (81 mg total) by mouth daily with breakfast. 10/16/13  Yes Kerrie Buffalo, NP  atorvastatin (LIPITOR) 40 MG tablet Take 1 tablet (40 mg total) by mouth daily. Resume home meds for high cholesterol 10/16/13  Yes Kerrie Buffalo, NP  diphenhydramine-acetaminophen (TYLENOL PM) 25-500 MG TABS Take 2 tablets by mouth at bedtime as needed (sleep and pain).   Yes Historical Provider, MD  FLUoxetine (PROZAC) 40 MG capsule Take 1 capsule (40 mg total) by mouth daily. For depression 05/29/14  Yes Charlcie Cradle, MD  insulin aspart (NOVOLOG) 100 UNIT/ML injection Inject 8 Units into the skin 3 (three) times  daily with meals. 10/16/13  Yes Kerrie Buffalo, NP  insulin glargine (LANTUS) 100 UNIT/ML injection Inject 26 Units into the skin at bedtime.    Yes Historical Provider, MD  QUEtiapine (SEROQUEL) 200 MG tablet Take 1 tablet (200 mg total) by mouth at bedtime. 05/29/14 05/29/15 Yes Charlcie Cradle, MD   BP 156/80 mmHg  Pulse 71  Temp(Src) 98.3 F (36.8 C) (Oral)  Resp 17  Ht 5\' 4"  (1.626 m)  Wt 171 lb 14.4 oz (77.973 kg)  BMI 29.49 kg/m2  SpO2 99% Physical Exam  Constitutional: She is oriented to person, place, and time. She appears well-nourished.  HENT:  Head: Normocephalic.  Mouth/Throat: Oropharynx is clear and moist.  Eyes: Pupils are equal, round, and reactive to light.  Neck: Normal range of motion.  Cardiovascular: Normal rate and regular rhythm.   Pulmonary/Chest: Effort normal and breath sounds normal.  Abdominal: Soft.  Musculoskeletal: Normal range of motion.  Neurological: She is alert and  oriented to person, place, and time.  Skin: Skin is warm and dry.  Nursing note and vitals reviewed.   ED Course  Procedures (including critical care time) Labs Review Labs Reviewed  COMPREHENSIVE METABOLIC PANEL - Abnormal; Notable for the following:    Sodium 132 (*)    Chloride 95 (*)    Glucose, Bld 484 (*)    Creatinine, Ser 1.23 (*)    Alkaline Phosphatase 152 (*)    GFR calc non Af Amer 54 (*)    All other components within normal limits  URINALYSIS, ROUTINE W REFLEX MICROSCOPIC (NOT AT [facility]) - Abnormal; Notable for the following:    APPearance CLOUDY (*)    Specific Gravity, Urine 1.036 (*)    Glucose, UA >1000 (*)    All other components within normal limits  CBG MONITORING, ED - Abnormal; Notable for the following:    Glucose-Capillary 432 (*)    All other components within normal limits  CBG MONITORING, ED - Abnormal; Notable for the following:    Glucose-Capillary 394 (*)    All other components within normal limits  I-STAT VENOUS BLOOD GAS, ED - Abnormal;  Notable for the following:    pH, Ven 7.384 (*)    pCO2, Ven 58.6 (*)    pO2, Ven 28.0 (*)    Bicarbonate 35.0 (*)    Acid-Base Excess 8.0 (*)    All other components within normal limits  CBG MONITORING, ED - Abnormal; Notable for the following:    Glucose-Capillary 294 (*)    All other components within normal limits  CBG MONITORING, ED - Abnormal; Notable for the following:    Glucose-Capillary 295 (*)    All other components within normal limits  CBC  URINE MICROSCOPIC-ADD ON  BLOOD GAS, VENOUS    Imaging Review No results found.   EKG Interpretation None     After 2 liters of fluid feeling better labs reviewed  BS 394 decreased to 294 patient given SQ insulin and DC home with careful monitoring and PCP followup  MDM   Final diagnoses:  Hyperglycemia         Junius Creamer, NP 07/28/14 0400  Everlene Balls, MD 07/28/14 1740

## 2014-07-28 NOTE — ED Notes (Signed)
PA at bedside.

## 2014-07-28 NOTE — Discharge Instructions (Signed)
Hyperglycemia °Hyperglycemia occurs when the glucose (sugar) in your blood is too high. Hyperglycemia can happen for many reasons, but it most often happens to people who do not know they have diabetes or are not managing their diabetes properly.  °CAUSES  °Whether you have diabetes or not, there are other causes of hyperglycemia. Hyperglycemia can occur when you have diabetes, but it can also occur in other situations that you might not be as aware of, such as: °Diabetes °· If you have diabetes and are having problems controlling your blood glucose, hyperglycemia could occur because of some of the following reasons: °¨ Not following your meal plan. °¨ Not taking your diabetes medications or not taking it properly. °¨ Exercising less or doing less activity than you normally do. °¨ Being sick. °Pre-diabetes °· This cannot be ignored. Before people develop Type 2 diabetes, they almost always have "pre-diabetes." This is when your blood glucose levels are higher than normal, but not yet high enough to be diagnosed as diabetes. Research has shown that some long-term damage to the body, especially the heart and circulatory system, may already be occurring during pre-diabetes. If you take action to manage your blood glucose when you have pre-diabetes, you may delay or prevent Type 2 diabetes from developing. °Stress °· If you have diabetes, you may be "diet" controlled or on oral medications or insulin to control your diabetes. However, you may find that your blood glucose is higher than usual in the hospital whether you have diabetes or not. This is often referred to as "stress hyperglycemia." Stress can elevate your blood glucose. This happens because of hormones put out by the body during times of stress. If stress has been the cause of your high blood glucose, it can be followed regularly by your caregiver. That way he/she can make sure your hyperglycemia does not continue to get worse or progress to  diabetes. °Steroids °· Steroids are medications that act on the infection fighting system (immune system) to block inflammation or infection. One side effect can be a rise in blood glucose. Most people can produce enough extra insulin to allow for this rise, but for those who cannot, steroids make blood glucose levels go even higher. It is not unusual for steroid treatments to "uncover" diabetes that is developing. It is not always possible to determine if the hyperglycemia will go away after the steroids are stopped. A special blood test called an A1c is sometimes done to determine if your blood glucose was elevated before the steroids were started. °SYMPTOMS °· Thirsty. °· Frequent urination. °· Dry mouth. °· Blurred vision. °· Tired or fatigue. °· Weakness. °· Sleepy. °· Tingling in feet or leg. °DIAGNOSIS  °Diagnosis is made by monitoring blood glucose in one or all of the following ways: °· A1c test. This is a chemical found in your blood. °· Fingerstick blood glucose monitoring. °· Laboratory results. °TREATMENT  °First, knowing the cause of the hyperglycemia is important before the hyperglycemia can be treated. Treatment may include, but is not be limited to: °· Education. °· Change or adjustment in medications. °· Change or adjustment in meal plan. °· Treatment for an illness, infection, etc. °· More frequent blood glucose monitoring. °· Change in exercise plan. °· Decreasing or stopping steroids. °· Lifestyle changes. °HOME CARE INSTRUCTIONS  °· Test your blood glucose as directed. °· Exercise regularly. Your caregiver will give you instructions about exercise. Pre-diabetes or diabetes which comes on with stress is helped by exercising. °· Eat wholesome,   balanced meals. Eat often and at regular, fixed times. Your caregiver or nutritionist will give you a meal plan to guide your sugar intake.  Being at an ideal weight is important. If needed, losing as little as 10 to 15 pounds may help improve blood  glucose levels. SEEK MEDICAL CARE IF:   You have questions about medicine, activity, or diet.  You continue to have symptoms (problems such as increased thirst, urination, or weight gain). SEEK IMMEDIATE MEDICAL CARE IF:   You are vomiting or have diarrhea.  Your breath smells fruity.  You are breathing faster or slower.  You are very sleepy or incoherent.  You have numbness, tingling, or pain in your feet or hands.  You have chest pain.  Your symptoms get worse even though you have been following your caregiver's orders.  If you have any other questions or concerns. Document Released: 07/01/2000 Document Revised: 03/30/2011 Document Reviewed: 05/04/2011 [facility] Patient Information 2015 [place], [place]. This information is not intended to replace advice given to you by your health care provider. Make sure you discuss any questions you have with your health care provider. Please monitor your blood sugars carefully.  Follow-up with your primary care physician or specialist by phone

## 2014-07-31 ENCOUNTER — Ambulatory Visit (INDEPENDENT_AMBULATORY_CARE_PROVIDER_SITE_OTHER): Payer: 59 | Admitting: Psychiatry

## 2014-07-31 ENCOUNTER — Encounter (HOSPITAL_COMMUNITY): Payer: Self-pay | Admitting: Psychiatry

## 2014-07-31 VITALS — BP 154/90 | HR 82 | Ht 64.0 in | Wt 170.8 lb

## 2014-07-31 DIAGNOSIS — F411 Generalized anxiety disorder: Secondary | ICD-10-CM | POA: Diagnosis not present

## 2014-07-31 DIAGNOSIS — F330 Major depressive disorder, recurrent, mild: Secondary | ICD-10-CM

## 2014-07-31 DIAGNOSIS — F331 Major depressive disorder, recurrent, moderate: Secondary | ICD-10-CM | POA: Diagnosis not present

## 2014-07-31 DIAGNOSIS — F410 Panic disorder [episodic paroxysmal anxiety] without agoraphobia: Secondary | ICD-10-CM

## 2014-07-31 DIAGNOSIS — F4310 Post-traumatic stress disorder, unspecified: Secondary | ICD-10-CM | POA: Diagnosis not present

## 2014-07-31 MED ORDER — CLONAZEPAM 2 MG PO TABS: 2.0000 mg | ORAL_TABLET | Freq: Every day | ORAL | Status: DC
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
## 2014-08-02 ENCOUNTER — Emergency Department (HOSPITAL_COMMUNITY): Payer: BLUE CROSS/BLUE SHIELD

## 2014-08-02 ENCOUNTER — Inpatient Hospital Stay (HOSPITAL_COMMUNITY)
Admission: AD | Admit: 2014-08-02 | Discharge: 2014-08-02 | Disposition: A | Discharge status: Home / Self Care | Payer: BLUE CROSS/BLUE SHIELD | Source: Ambulatory Visit | Attending: Obstetrics and Gynecology | Admitting: Obstetrics and Gynecology

## 2014-08-02 ENCOUNTER — Emergency Department (HOSPITAL_COMMUNITY)
Admission: EM | Admit: 2014-08-02 | Discharge: 2014-08-03 | Disposition: A | Discharge status: Home / Self Care | Payer: BLUE CROSS/BLUE SHIELD | Attending: Emergency Medicine | Admitting: Emergency Medicine

## 2014-08-02 ENCOUNTER — Encounter (HOSPITAL_COMMUNITY): Payer: Self-pay | Admitting: *Deleted

## 2014-08-02 DIAGNOSIS — M1990 Unspecified osteoarthritis, unspecified site: Secondary | ICD-10-CM | POA: Insufficient documentation

## 2014-08-02 DIAGNOSIS — E1021 Type 1 diabetes mellitus with diabetic nephropathy: Secondary | ICD-10-CM | POA: Insufficient documentation

## 2014-08-02 DIAGNOSIS — F322 Major depressive disorder, single episode, severe without psychotic features: Secondary | ICD-10-CM | POA: Insufficient documentation

## 2014-08-02 DIAGNOSIS — R1084 Generalized abdominal pain: Secondary | ICD-10-CM | POA: Insufficient documentation

## 2014-08-02 DIAGNOSIS — Z87442 Personal history of urinary calculi: Secondary | ICD-10-CM | POA: Insufficient documentation

## 2014-08-02 DIAGNOSIS — E785 Hyperlipidemia, unspecified: Secondary | ICD-10-CM | POA: Diagnosis not present

## 2014-08-02 DIAGNOSIS — Z7982 Long term (current) use of aspirin: Secondary | ICD-10-CM | POA: Diagnosis not present

## 2014-08-02 DIAGNOSIS — G43909 Migraine, unspecified, not intractable, without status migrainosus: Secondary | ICD-10-CM | POA: Diagnosis not present

## 2014-08-02 DIAGNOSIS — E1065 Type 1 diabetes mellitus with hyperglycemia: Secondary | ICD-10-CM | POA: Diagnosis not present

## 2014-08-02 DIAGNOSIS — Z79899 Other long term (current) drug therapy: Secondary | ICD-10-CM | POA: Insufficient documentation

## 2014-08-02 DIAGNOSIS — Z794 Long term (current) use of insulin: Secondary | ICD-10-CM | POA: Insufficient documentation

## 2014-08-02 DIAGNOSIS — R7989 Other specified abnormal findings of blood chemistry: Secondary | ICD-10-CM | POA: Diagnosis not present

## 2014-08-02 DIAGNOSIS — M797 Fibromyalgia: Secondary | ICD-10-CM | POA: Insufficient documentation

## 2014-08-02 DIAGNOSIS — Z8719 Personal history of other diseases of the digestive system: Secondary | ICD-10-CM | POA: Diagnosis not present

## 2014-08-02 DIAGNOSIS — Z8781 Personal history of (healed) traumatic fracture: Secondary | ICD-10-CM | POA: Diagnosis not present

## 2014-08-02 DIAGNOSIS — E1142 Type 2 diabetes mellitus with diabetic polyneuropathy: Secondary | ICD-10-CM | POA: Insufficient documentation

## 2014-08-02 DIAGNOSIS — R197 Diarrhea, unspecified: Secondary | ICD-10-CM | POA: Insufficient documentation

## 2014-08-02 DIAGNOSIS — R739 Hyperglycemia, unspecified: Secondary | ICD-10-CM

## 2014-08-02 DIAGNOSIS — R32 Unspecified urinary incontinence: Secondary | ICD-10-CM

## 2014-08-02 DIAGNOSIS — F419 Anxiety disorder, unspecified: Secondary | ICD-10-CM | POA: Insufficient documentation

## 2014-08-02 DIAGNOSIS — R109 Unspecified abdominal pain: Secondary | ICD-10-CM | POA: Diagnosis present

## 2014-08-02 LAB — URINALYSIS, ROUTINE W REFLEX MICROSCOPIC
Bilirubin Urine: NEGATIVE
Glucose, UA: >1000 mg/dL — AB
Hgb urine dipstick: NEGATIVE
Ketones, ur: NEGATIVE mg/dL
Leukocytes, UA: NEGATIVE
Nitrite: NEGATIVE
Protein, ur: NEGATIVE mg/dL
Specific Gravity, Urine: 1.033 — ABNORMAL HIGH (ref 1.005–1.030)
Urobilinogen, UA: 0.2 mg/dL (ref 0.0–1.0)
pH: 5 (ref 5.0–8.0)

## 2014-08-02 LAB — BLOOD GAS, VENOUS

## 2014-08-02 LAB — COMPREHENSIVE METABOLIC PANEL
ALT: 16 U/L (ref 14–54)
AST: 21 U/L (ref 15–41)
Albumin: 3.6 g/dL (ref 3.5–5.0)
Alkaline Phosphatase: 156 U/L — ABNORMAL HIGH (ref 38–126)
Anion gap: 12 (ref 5–15)
BUN: 12 mg/dL (ref 6–20)
CO2: 21 mmol/L — ABNORMAL LOW (ref 22–32)
Calcium: 9.1 mg/dL (ref 8.9–10.3)
Chloride: 100 mmol/L — ABNORMAL LOW (ref 101–111)
Creatinine, Ser: 1.23 mg/dL — ABNORMAL HIGH (ref 0.44–1.00)
GFR calc Af Amer: >60 mL/min (ref >60)
GFR calc non Af Amer: 54 mL/min — ABNORMAL LOW (ref >60)
Glucose, Bld: 685 mg/dL (ref 65–99)
Potassium: 4.2 mmol/L (ref 3.5–5.1)
Sodium: 133 mmol/L — ABNORMAL LOW (ref 135–145)
Total Bilirubin: 0.5 mg/dL (ref 0.3–1.2)
Total Protein: 7.3 g/dL (ref 6.5–8.1)

## 2014-08-02 LAB — CBC
HCT: 42.6 % (ref 36.0–46.0)
Hemoglobin: 14.6 g/dL (ref 12.0–15.0)
MCH: 33 pg (ref 26.0–34.0)
MCHC: 34.3 g/dL (ref 30.0–36.0)
MCV: 96.2 fL (ref 78.0–100.0)
Platelets: 347 10*3/uL (ref 150–400)
RBC: 4.43 MIL/uL (ref 3.87–5.11)
RDW: 12 % (ref 11.5–15.5)
WBC: 11.4 10*3/uL — ABNORMAL HIGH (ref 4.0–10.5)

## 2014-08-02 LAB — LIPASE, BLOOD: Lipase: 23 U/L (ref 22–51)

## 2014-08-02 MED ORDER — DICYCLOMINE HCL 20 MG PO TABS: 20.0000 mg | ORAL_TABLET | Freq: Two times a day (BID) | ORAL | Status: DC

## 2014-08-02 MED ORDER — OXYCODONE-ACETAMINOPHEN 5-325 MG PO TABS: ORAL_TABLET | ORAL | Status: DC

## 2014-08-02 MED ORDER — SODIUM CHLORIDE 0.9 % IV BOLUS (SEPSIS)
2000.0000 mL | Freq: Once | INTRAVENOUS | Status: AC
  Administered 2014-08-02: 2000 mL via INTRAVENOUS

## 2014-08-02 MED ORDER — IOHEXOL 300 MG/ML  SOLN
100.0000 mL | Freq: Once | INTRAMUSCULAR | Status: AC | PRN
  Administered 2014-08-02: 100 mL via INTRAVENOUS

## 2014-08-02 MED ORDER — IOHEXOL 300 MG/ML  SOLN
25.0000 mL | Freq: Once | INTRAMUSCULAR | Status: AC | PRN
  Administered 2014-08-02: 25 mL via ORAL

## 2014-08-02 MED ORDER — LOPERAMIDE HCL 2 MG PO CAPS: 2.0000 mg | ORAL_CAPSULE | Freq: Four times a day (QID) | ORAL | Status: DC | PRN

## 2014-08-02 MED ORDER — MORPHINE SULFATE 4 MG/ML IJ SOLN
4.0000 mg | Freq: Once | INTRAMUSCULAR | Status: AC
  Administered 2014-08-02: 4 mg via INTRAVENOUS
  Filled 2014-08-02: qty 1

## 2014-08-02 MED ORDER — ONDANSETRON HCL 4 MG/2ML IJ SOLN
4.0000 mg | Freq: Once | INTRAMUSCULAR | Status: AC
  Administered 2014-08-02: 4 mg via INTRAVENOUS
  Filled 2014-08-02: qty 2

## 2014-08-02 NOTE — MAU Note (Signed)
Pt reports she was seen here a few years ago and was dx with abdominal adhesions. States she has been having the same type of pain for the last 3 weeks and is also unable to control her bowels. States her doctor sent her to pain control but the meds are not helping.

## 2014-08-02 NOTE — ED Provider Notes (Signed)
CSN: [id number]     Arrival date & time 08/02/14  1207 History   First MD Initiated Contact with Patient 08/02/14 1648     Chief Complaint  Patient presents with  . Abdominal Cramping     (Consider location/radiation/quality/duration/timing/severity/associated sxs/prior Treatment) HPI   Blood pressure 159/71, pulse 94, temperature 98.8 F (37.1 C), temperature source Oral, resp. rate 16, weight 167 lb (75.751 kg), SpO2 100 %.  Maureen [name] is a 44 y.o. female complaining of severe, 7 out of 10 intermittent abdominal pain worsening over the course of 2 days, initially left-sided. Patient presented for evaluation at [facility] hospital last night, she's had lysis of adhesions by Dr. Pamala Hurry in the past. When patient [facility] she found that she was incontinent of stool. She didn't realize until she felt the stool. She is no history of surgical issues with the spine or trauma to the spine. She denies fever, chills, back pain. She states that her urination is abnormal in that she is drinking plenty of water but she's not urinating very much. She states that her diabetes is usually well controlled with sugars in the 120s to 150 range, she last checked her blood sugar this morning it was 188. She's never been in DKA. She denies nausea, vomiting, history of IV drug use. She does not have any recent antibiotics use. Pt had diarrhea today, it is nonbloody, non-melanotic.   Past Medical History  Diagnosis Date  . 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 Fibromyalgia   . Hyperlipidemia   . Abdominal pain   . Nausea & vomiting   . Biliary dyskinesia   . Depression   . Joint pain   . Skin rash     due to medications  . Post traumatic stress disorder (PTSD)   . Sleep trouble   . Major depressive disorder   . Cervical strain   . Closed head injury 2010  . Renal stones   . Migraine     hx of none recent  . Arthritis   .  Hypoglycemia   . Anxiety    Past Surgical History  Procedure Laterality Date  . Lipoma removed  yrs ago  . Knee surgery Left 2012  . Cesarean section  1997, 1999  . Cholecystectomy  02/10/2011    Procedure: LAPAROSCOPIC CHOLECYSTECTOMY WITH INTRAOPERATIVE CHOLANGIOGRAM;  Surgeon: Judieth Keens, DO;  Location: [facility];  Service: General;  Laterality: N/A;  . Robotic assisted laparoscopic lysis of adhesion N/A 03/04/2012    Procedure: ROBOTIC ASSISTED LAPAROSCOPIC LYSIS OF EXTENSIVE ADHESIONS;  Surgeon: Claiborne Billings A. Pamala Hurry, MD;  Location: [facility];  Service: Gynecology;  Laterality: N/A;  . Tonsillectomy  2001  . Abdominal hysterectomy  2001  . Eye surgery  2006    laser eye surgery left eye  . Exploratory laparomty  Mar 04, 2012  . Laparoscopic appendectomy N/A 10/12/2012    Procedure: DIAGNOSTIC APPENDECTOMY LAPAROSCOPIC;  Surgeon: Madilyn Hook, DO;  Location: [facility];  Service: General;  Laterality: N/A;  . Appendectomy     Family History  Problem Relation Age of Onset  . Cancer Father     lymphoma  . Nephrolithiasis Father   . Vascular Disease Father   . Alcohol abuse Father   . Drug abuse Father   . Cancer Maternal Grandmother     colon  . Hypertension Mother   . Heart disease Mother   . Cataracts  Mother   . Depression Mother   . Diabetes Brother   . Drug abuse Brother   . Mental illness Maternal Grandfather   . Cancer Maternal Grandfather   . Heart disease Paternal Grandmother   . Heart disease Paternal Grandfather   . Suicidality Maternal Uncle    History  Substance Use Topics  . Smoking status: Never Smoker   . Smokeless tobacco: Never Used  . Alcohol Use: No   OB History    No data available     Review of Systems  10 systems reviewed and found to be negative, except as noted in the HPI.   Allergies  Cephalexin; Ibuprofen; and Meloxicam  Home Medications   Prior to Admission medications   Medication Sig Start Date End Date Taking? Authorizing Provider   aspirin EC 81 MG tablet Take 1 tablet (81 mg total) by mouth daily with breakfast. 10/16/13  Yes Kerrie Buffalo, NP  atorvastatin (LIPITOR) 40 MG tablet Take 1 tablet (40 mg total) by mouth daily. Resume home meds for high cholesterol 10/16/13  Yes Kerrie Buffalo, NP  diphenhydramine-acetaminophen (TYLENOL PM) 25-500 MG TABS Take 2 tablets by mouth at bedtime as needed (sleep and pain).   Yes Historical Provider, MD  FLUoxetine (PROZAC) 40 MG capsule Take 1 capsule (40 mg total) by mouth daily. For depression 05/29/14  Yes Charlcie Cradle, MD  insulin aspart (NOVOLOG) 100 UNIT/ML injection Inject 8 Units into the skin 3 (three) times daily with meals. 10/16/13  Yes Kerrie Buffalo, NP  insulin glargine (LANTUS) 100 UNIT/ML injection Inject 26 Units into the skin at bedtime.    Yes Historical Provider, MD  Valerian Root EXTR Take 1 capsule by mouth as needed (FOR SLEEP).   Yes Historical Provider, MD  clonazePAM (KLONOPIN) 2 MG tablet Take 1 tablet (2 mg total) by mouth at bedtime. Patient taking differently: Take 2 mg by mouth at bedtime as needed for anxiety.  07/31/14 07/31/15  Leonides Grills, MD  dicyclomine (BENTYL) 20 MG tablet Take 1 tablet (20 mg total) by mouth 2 (two) times daily. 08/02/14   Daylin Gruszka, PA-C  loperamide (IMODIUM) 2 MG capsule Take 1 capsule (2 mg total) by mouth 4 (four) times daily as needed for diarrhea or loose stools. 08/02/14   Roverto Bodmer, PA-C  oxyCODONE-acetaminophen (PERCOCET/ROXICET) 5-325 MG per tablet 1 to 2 tabs PO q6hrs  PRN for pain 08/02/14   Elmyra Ricks Lenin Kuhnle, PA-C   BP 145/74 mmHg  Pulse 66  Temp(Src) 98.8 F (37.1 C) (Oral)  Resp 16  Wt 167 lb (75.751 kg)  SpO2 99% Physical Exam  Constitutional: She is oriented to person, place, and time. She appears well-developed and well-nourished. No distress.  HENT:  Head: Normocephalic.  Eyes: Conjunctivae and EOM are normal.  Cardiovascular: Normal rate, regular rhythm and intact distal pulses.    Pulmonary/Chest: Effort normal and breath sounds normal. No stridor.  Abdominal: Soft. Bowel sounds are normal.  Musculoskeletal: Normal range of motion.  No tenderness to percussion of the midline thoracic or lumbar spine, slightly reduced rectal tone.  Bilateral patellar reflexes 2+, patient has full strength in the lower extremities, extensor hallicus longus longus is 5 out of 5 bilaterally, sensation is intact to pinprick and light touch bilaterally.  Neurological: She is alert and oriented to person, place, and time.  Psychiatric: She has a normal mood and affect.  Nursing note and vitals reviewed.   ED Course  Procedures (including critical care time) Labs Review Labs Reviewed  COMPREHENSIVE METABOLIC PANEL - Abnormal; Notable for the following:    Sodium 133 (*)    Chloride 100 (*)    CO2 21 (*)    Glucose, Bld 685 (*)    Creatinine, Ser 1.23 (*)    Alkaline Phosphatase 156 (*)    GFR calc non Af Amer 54 (*)    All other components within normal limits  CBC - Abnormal; Notable for the following:    WBC 11.4 (*)    All other components within normal limits  URINALYSIS, ROUTINE W REFLEX MICROSCOPIC (NOT AT [facility]) - Abnormal; Notable for the following:    Specific Gravity, Urine 1.033 (*)    Glucose, UA >1000 (*)    All other components within normal limits  LIPASE, BLOOD  BLOOD GAS, VENOUS  URINE MICROSCOPIC-ADD ON  GI PATHOGEN PANEL BY PCR, STOOL  CBG MONITORING, ED  CBG MONITORING, ED  CBG MONITORING, ED    Imaging Review Mr Lumbar Spine Wo Contrast  08/02/2014   CLINICAL DATA:  Progressive abdominal pain.  Fecal incontinence.  EXAM: MRI LUMBAR SPINE WITHOUT CONTRAST  TECHNIQUE: Multiplanar, multisequence MR imaging of the lumbar spine was performed. No intravenous contrast was administered.  COMPARISON:  CT abdomen and pelvis 01/30/2014  FINDINGS: Normal signal is present in the conus medullaris which terminates at L1-2. Marrow signal, vertebral body heights,  alignment are normal. Limited imaging of the abdomen is unremarkable. There is no significant adenopathy.  No significant focal disc protrusion or stenosis is present. The foramina are patent bilaterally.  IMPRESSION: Negative MRI of the lumbar spine.   Electronically Signed   By: San Morelle M.D.   On: 08/02/2014 19:44   Ct Abdomen Pelvis W Contrast  08/02/2014   CLINICAL DATA:  44 year old female with abdominal pain and nausea  EXAM: CT ABDOMEN AND PELVIS WITH CONTRAST  TECHNIQUE: Multidetector CT imaging of the abdomen and pelvis was performed using the standard protocol following bolus administration of intravenous contrast.  CONTRAST:  143mL OMNIPAQUE IOHEXOL 300 MG/ML  SOLN  COMPARISON:  CT dated 01/30/2014  FINDINGS: The visualized lung bases are clear. No intra-abdominal free air or free fluid.  Cholecystectomy. The liver, pancreas, spleen, adrenal glands, kidneys appear unremarkable. There are bilateral extrarenal pelvises. The visualized ureters and urinary bladder appear unremarkable. Hysterectomy.  Oral contrast opacifies multiple loops of small bowel. There is no evidence of bowel obstruction or inflammation. Loose stool noted throughout the colon compatible with diarrheal state. Correlation with clinical exam and stool cultures recommended. There is apparent thickening of the distal colon, likely related to underdistention. Colitis is less likely. Clinical correlation is recommended. Appendectomy.  The visualized abdominal aorta and IVC appear unremarkable. No portal venous gas identified. There is no adenopathy. There is induration of the umbilicus and periumbilical subcutaneous fat, similar to the prior study. Clinical correlation is recommended to exclude a strangulated fat containing hernia or fat necrosis. No drainable fluid collection/abscess identified. Focal area of nodularity along the anterior pelvic peritoneum appears similar to prior study and may represent scarring. There is  mild degenerative changes of the spine. No acute fracture.  IMPRESSION: Diarrheal state. Correlation with clinical exam and stool cultures recommended. No bowel obstruction.  Diffuse thickened appearance of the distal colon likely related to underdistention. colitis is less likely. Clinical correlation is recommended.   Electronically Signed   By: Anner Crete M.D.   On: 08/02/2014 21:42     EKG Interpretation None      MDM  Final diagnoses:  Incontinence  Diarrhea  Hyperglycemia without ketosis  Elevated serum creatinine     Filed Vitals:   08/02/14 2200 08/02/14 2215 08/02/14 2230 08/02/14 2251  BP: 135/85 141/76 145/74 145/74  Pulse: 63 65 66 66  Temp:      TempSrc:      Resp:    16  Weight:      SpO2: 99% 100% 99% 99%    Medications  morphine 4 MG/ML injection 4 mg (not administered)  sodium chloride 0.9 % bolus 2,000 mL (0 mLs Intravenous Stopped 08/02/14 2233)  morphine 4 MG/ML injection 4 mg (4 mg Intravenous Given 08/02/14 1958)  ondansetron (ZOFRAN) injection 4 mg (4 mg Intravenous Given 08/02/14 1958)  iohexol (OMNIPAQUE) 300 MG/ML solution 25 mL (25 mLs Oral Contrast Given 08/02/14 2004)  iohexol (OMNIPAQUE) 300 MG/ML solution 100 mL (100 mLs Intravenous Contrast Given 08/02/14 2112)    Maureen [name] is a pleasant 44 y.o. female presenting with abdominal pain, incontinent of stool. Abdominal exam is nonsurgical, she doesn't have any back pain, her neuro exam is nonfocal except for a very mildly reduced rectal tone. Patient has extremely elevated blood glucose, this is atypical for her. Her sugars are normally very well controlled. She has no significant anion gap. Plan MRI, CT abdomen pelvis and insulin and fluid for hyperglycemia. Her creatinine is mildly elevated, this is not atypical for her recent results however.  MRI of the lumbar spine is negative. CT of abdomen pelvis only shows diarrhea. Serial abdominal exams remain unchanged.  CBG is 341, results  are not crossing over into epic. Patient will have stool testing by PCR. I've explained to her that the results will not resolve tonight and that her primary care physician must request the results and follow them up. She is to return to the ED for any new or worsening symptoms.  Evaluation does not show pathology that would require ongoing emergent intervention or inpatient treatment. Pt is hemodynamically stable and mentating appropriately. Discussed findings and plan with patient/guardian, who agrees with care plan. All questions answered. Return precautions discussed and outpatient follow up given.   New Prescriptions   DICYCLOMINE (BENTYL) 20 MG TABLET    Take 1 tablet (20 mg total) by mouth 2 (two) times daily.   LOPERAMIDE (IMODIUM) 2 MG CAPSULE    Take 1 capsule (2 mg total) by mouth 4 (four) times daily as needed for diarrhea or loose stools.   OXYCODONE-ACETAMINOPHEN (PERCOCET/ROXICET) 5-325 MG PER TABLET    1 to 2 tabs PO q6hrs  PRN for pain         Monico Blitz, PA-C 08/02/14 8882  Blanchie Dessert, MD 08/03/14 2231

## 2014-08-02 NOTE — MAU Provider Note (Signed)
History     CSN: [id number]  Arrival date and time: 08/02/14 0737   First Provider Initiated Contact with Patient 08/02/14 0420      No chief complaint on file.  HPI Comments: Maureen [name] is a 44 y.o. Who presents today with abdominal pain. She states that she has had the pain for about 2-3 weeks. She had surgery in 2012 with Dr. Valentino Saxon. She has not been seen since in that office. She has not called them about the pain. She is also seen at a pain clinic and is currently on OxyContin, and lidocaine patches. She states that she has not taken anything for pain in about two days. She has an appointment with the pain clinic on 08/06/14.   She also reports that she has had fecal incontinence x 2 days. She states that she has had two episodes of fecal incontinence. She states that is it "normal" stool, and she "just didn't know she had gone" each time.   Abdominal Pain This is a recurrent problem. The current episode started 1 to 4 weeks ago. The onset quality is gradual. The problem has been gradually worsening. The pain is located in the generalized abdominal region. The pain is at a severity of 10/10. The abdominal pain does not radiate. Associated symptoms include nausea and vomiting. Pertinent negatives include no constipation, diarrhea or fever. Nothing aggravates the pain. The pain is relieved by nothing. She has tried oral narcotic analgesics for the symptoms. The treatment provided no relief. Prior diagnostic workup includes surgery.     Past Medical History  Diagnosis Date  . 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 Fibromyalgia   . Hyperlipidemia   . Abdominal pain   . Nausea & vomiting   . Biliary dyskinesia   . Depression   . Joint pain   . Skin rash     due to medications  . Post traumatic stress disorder (PTSD)   . Sleep trouble   . Major depressive disorder   . Cervical strain   . Closed head injury 2010  . Renal stones   .  Migraine     hx of none recent  . Arthritis   . Hypoglycemia   . Anxiety     Past Surgical History  Procedure Laterality Date  . Lipoma removed  yrs ago  . Knee surgery Left 2012  . Cesarean section  1997, 1999  . Cholecystectomy  02/10/2011    Procedure: LAPAROSCOPIC CHOLECYSTECTOMY WITH INTRAOPERATIVE CHOLANGIOGRAM;  Surgeon: Judieth Keens, DO;  Location: [place];  Service: General;  Laterality: N/A;  . Robotic assisted laparoscopic lysis of adhesion N/A 03/04/2012    Procedure: ROBOTIC ASSISTED LAPAROSCOPIC LYSIS OF EXTENSIVE ADHESIONS;  Surgeon: Claiborne Billings A. Pamala Hurry, MD;  Location: [facility];  Service: Gynecology;  Laterality: N/A;  . Tonsillectomy  2001  . Abdominal hysterectomy  2001  . Eye surgery  2006    laser eye surgery left eye  . Exploratory laparomty  Mar 04, 2012  . Laparoscopic appendectomy N/A 10/12/2012    Procedure: DIAGNOSTIC APPENDECTOMY LAPAROSCOPIC;  Surgeon: Madilyn Hook, DO;  Location: [facility];  Service: General;  Laterality: N/A;  . Appendectomy      Family History  Problem Relation Age of Onset  . Cancer Father     lymphoma  . Nephrolithiasis Father   . Vascular Disease Father   . Alcohol abuse Father   . Drug abuse Father   . Cancer Maternal Grandmother  colon  . Hypertension Mother   . Heart disease Mother   . Cataracts Mother   . Depression Mother   . Diabetes Brother   . Drug abuse Brother   . Mental illness Maternal Grandfather   . Cancer Maternal Grandfather   . Heart disease Paternal Grandmother   . Heart disease Paternal Grandfather   . Suicidality Maternal Uncle     History  Substance Use Topics  . Smoking status: Never Smoker   . Smokeless tobacco: Never Used  . Alcohol Use: No    Allergies:  Allergies  Allergen Reactions  . Meloxicam Nausea Only  . Cephalexin Rash    Pt was admitted to the hospital upon taking.  . Ibuprofen Nausea And Vomiting and Rash    Prescriptions prior to admission  Medication Sig Dispense  Refill Last Dose  . aspirin EC 81 MG tablet Take 1 tablet (81 mg total) by mouth daily with breakfast.   Past Week at Unknown time  . atorvastatin (LIPITOR) 40 MG tablet Take 1 tablet (40 mg total) by mouth daily. Resume home meds for high cholesterol 90 tablet 0 Past Week at Unknown time  . clonazePAM (KLONOPIN) 2 MG tablet Take 1 tablet (2 mg total) by mouth at bedtime. 30 tablet 2 08/01/2014 at Unknown time  . diphenhydramine-acetaminophen (TYLENOL PM) 25-500 MG TABS Take 2 tablets by mouth at bedtime as needed (sleep and pain).   Past Month at Unknown time  . FLUoxetine (PROZAC) 40 MG capsule Take 1 capsule (40 mg total) by mouth daily. For depression 30 capsule 5 Past Week at Unknown time  . insulin aspart (NOVOLOG) 100 UNIT/ML injection Inject 8 Units into the skin 3 (three) times daily with meals. 10 mL 11 08/01/2014 at Unknown time  . insulin glargine (LANTUS) 100 UNIT/ML injection Inject 26 Units into the skin at bedtime.    08/01/2014 at Unknown time    Review of Systems  Constitutional: Negative for fever.  Gastrointestinal: Positive for nausea, vomiting and abdominal pain. Negative for diarrhea and constipation.   Physical Exam   Blood pressure 122/70, pulse 70, temperature 98.4 F (36.9 C), temperature source Oral, resp. rate 16, height 5\' 4"  (1.626 m), weight 75.751 kg (167 lb).  Physical Exam  Nursing note and vitals reviewed. Constitutional: She is oriented to person, place, and time. She appears well-developed and well-nourished. No distress.  HENT:  Head: Normocephalic.  Cardiovascular: Normal rate.   Respiratory: Effort normal.  GI: Soft. She exhibits no distension and no mass. There is no tenderness. There is no rebound and no guarding.  Neurological: She is alert and oriented to person, place, and time.  Skin: Skin is warm and dry.  Psychiatric: She has a normal mood and affect.    MAU Course  Procedures  MDM D/W the patient at length, and offered transfer to  [facility] for further evaluation of abdominal pain/incontience, GYN clinic appointment or FU with [facility]. Patient would like to FU with Dr. Pamala Hurry.   Assessment and Plan   1. Generalized abdominal pain    DC home Comfort measures reviewed  RX: none  Return to MAU as needed FU with GYN as planned  Follow-up Information    Schedule an appointment as soon as possible for a visit with Ala Dach., MD.   Specialty:  Obstetrics and Gynecology   Contact information:   [address] [place] [address] [phone]         Mathis Bud 08/02/2014, 4:21 AM

## 2014-08-02 NOTE — ED Notes (Signed)
Pt presents for re-assessment.  Pt reports she was seen at [facility] this morning w/o labs or imaging.  Pt reports she incontinence of stool this morning.  Pt reports pain began to L side of abdomen and is now generalized.  Pt reports dizziness, nausea and vomiting.

## 2014-08-02 NOTE — Discharge Instructions (Signed)

## 2014-08-02 NOTE — ED Notes (Signed)
Patient transported to CT 

## 2014-08-02 NOTE — Discharge Instructions (Signed)
Take percocet for breakthrough pain, do not drink alcohol, drive, care for children or do other critical tasks while taking percocet.  The stool testing that we have done in the emergency room will not result for several days, you will  need to call your primary care physician and let them know the tests are pending, he will need to get the records and follow-up the results.   Your kidney function test today were not normal. He needs to drink plenty of water and have this rechecked by her primary care doctor in the next week. Do not take anyNSAIDs (motrin, ibuprofen, Advil, aleve , aspirin, naproxen etc.) because they can further hurt your kidneys.   Do not hesitate to return to the emergency room for any new, worsening or concerning symptoms.   Diarrhea Diarrhea is frequent loose and watery bowel movements. It can cause you to feel weak and dehydrated. Dehydration can cause you to become tired and thirsty, have a dry mouth, and have decreased urination that often is dark yellow. Diarrhea is a sign of another problem, most often an infection that will not last long. In most cases, diarrhea typically lasts 2-3 days. However, it can last longer if it is a sign of something more serious. It is important to treat your diarrhea as directed by your caregiver to lessen or prevent future episodes of diarrhea. CAUSES  Some common causes include:  Gastrointestinal infections caused by viruses, bacteria, or parasites.  Food poisoning or food allergies.  Certain medicines, such as antibiotics, chemotherapy, and laxatives.  Artificial sweeteners and fructose.  Digestive disorders. HOME CARE INSTRUCTIONS  Ensure adequate fluid intake (hydration): Have 1 cup (8 oz) of fluid for each diarrhea episode. Avoid fluids that contain simple sugars or sports drinks, fruit juices, whole milk products, and sodas. Your urine should be clear or pale yellow if you are drinking enough fluids. Hydrate with an oral  rehydration solution that you can purchase at pharmacies, retail stores, and online. You can prepare an oral rehydration solution at home by mixing the following ingredients together:   - tsp table salt.   tsp baking soda.   tsp salt substitute containing potassium chloride.  1  tablespoons sugar.  1 L (34 oz) of water.  Certain foods and beverages may increase the speed at which food moves through the gastrointestinal (GI) tract. These foods and beverages should be avoided and include:  Caffeinated and alcoholic beverages.  High-fiber foods, such as raw fruits and vegetables, nuts, seeds, and whole grain breads and cereals.  Foods and beverages sweetened with sugar alcohols, such as xylitol, sorbitol, and mannitol.  Some foods may be well tolerated and may help thicken stool including:  Starchy foods, such as rice, toast, pasta, low-sugar cereal, oatmeal, grits, baked potatoes, crackers, and bagels.  Bananas.  Applesauce.  Add probiotic-rich foods to help increase healthy bacteria in the GI tract, such as yogurt and fermented milk products.  Wash your hands well after each diarrhea episode.  Only take over-the-counter or prescription medicines as directed by your caregiver.  Take a warm bath to relieve any burning or pain from frequent diarrhea episodes. SEEK IMMEDIATE MEDICAL CARE IF:   You are unable to keep fluids down.  You have persistent vomiting.  You have blood in your stool, or your stools are black and tarry.  You do not urinate in 6-8 hours, or there is only a small amount of very dark urine.  You have abdominal pain that increases or localizes.  You have weakness, dizziness, confusion, or light-headedness.  You have a severe headache.  Your diarrhea gets worse or does not get better.  You have a fever or persistent symptoms for more than 2-3 days.  You have a fever and your symptoms suddenly get worse. MAKE SURE YOU:   Understand these  instructions.  Will watch your condition.  Will get help right away if you are not doing well or get worse. Document Released: 12/26/2001 Document Revised: 05/22/2013 Document Reviewed: 09/13/2011 [facility] Patient Information 2015 [place], [place]. This information is not intended to replace advice given to you by your health care provider. Make sure you discuss any questions you have with your health care provider.

## 2014-08-02 NOTE — ED Notes (Signed)
Back from CT

## 2014-08-02 NOTE — ED Notes (Signed)
CBG resulted - 341

## 2014-08-02 NOTE — ED Notes (Signed)
BS 498

## 2014-08-02 NOTE — ED Notes (Signed)
Pt reports abd cramping for several years; yesterday went to [facility] hospitals and told to come here to be checked for adhesions and she has had fecal incontinence. Denies n/v

## 2014-08-02 NOTE — ED Notes (Signed)
Patient transported to MRI 

## 2014-08-03 LAB — URINE MICROSCOPIC-ADD ON

## 2014-08-03 LAB — CBG MONITORING, ED
Glucose-Capillary: 498 mg/dL — ABNORMAL HIGH (ref 65–99)
Glucose-Capillary: 341 mg/dL — ABNORMAL HIGH (ref 65–99)

## 2014-08-03 NOTE — ED Notes (Signed)
Patient is alert and orientedx4.  Patient was explained discharge instructions and they understood them with no questions.  The patient's son, Franziska Podgurski is taking the patient home.

## 2014-08-14 ENCOUNTER — Ambulatory Visit (HOSPITAL_COMMUNITY): Payer: Self-pay | Admitting: Psychiatry

## 2014-08-28 ENCOUNTER — Encounter: Payer: Self-pay | Admitting: *Deleted

## 2014-09-02 ENCOUNTER — Emergency Department (HOSPITAL_COMMUNITY): Payer: BLUE CROSS/BLUE SHIELD

## 2014-09-02 ENCOUNTER — Inpatient Hospital Stay (HOSPITAL_COMMUNITY)
Admission: EM | Admit: 2014-09-02 | Discharge: 2014-09-05 | DRG: 176 | Disposition: A | Discharge status: Home / Self Care | Payer: BLUE CROSS/BLUE SHIELD | Attending: Internal Medicine | Admitting: Internal Medicine

## 2014-09-02 ENCOUNTER — Encounter (HOSPITAL_COMMUNITY): Payer: Self-pay | Admitting: *Deleted

## 2014-09-02 DIAGNOSIS — K219 Gastro-esophageal reflux disease without esophagitis: Secondary | ICD-10-CM | POA: Diagnosis present

## 2014-09-02 DIAGNOSIS — F329 Major depressive disorder, single episode, unspecified: Secondary | ICD-10-CM | POA: Diagnosis present

## 2014-09-02 DIAGNOSIS — R197 Diarrhea, unspecified: Secondary | ICD-10-CM | POA: Diagnosis present

## 2014-09-02 DIAGNOSIS — F411 Generalized anxiety disorder: Secondary | ICD-10-CM | POA: Diagnosis present

## 2014-09-02 DIAGNOSIS — E860 Dehydration: Secondary | ICD-10-CM | POA: Diagnosis present

## 2014-09-02 DIAGNOSIS — Z79891 Long term (current) use of opiate analgesic: Secondary | ICD-10-CM

## 2014-09-02 DIAGNOSIS — Z79899 Other long term (current) drug therapy: Secondary | ICD-10-CM | POA: Diagnosis not present

## 2014-09-02 DIAGNOSIS — E109 Type 1 diabetes mellitus without complications: Secondary | ICD-10-CM | POA: Diagnosis present

## 2014-09-02 DIAGNOSIS — E11319 Type 2 diabetes mellitus with unspecified diabetic retinopathy without macular edema: Secondary | ICD-10-CM | POA: Diagnosis present

## 2014-09-02 DIAGNOSIS — R32 Unspecified urinary incontinence: Secondary | ICD-10-CM | POA: Diagnosis not present

## 2014-09-02 DIAGNOSIS — R112 Nausea with vomiting, unspecified: Secondary | ICD-10-CM | POA: Diagnosis not present

## 2014-09-02 DIAGNOSIS — Z886 Allergy status to analgesic agent status: Secondary | ICD-10-CM

## 2014-09-02 DIAGNOSIS — R7401 Elevation of levels of liver transaminase levels: Secondary | ICD-10-CM | POA: Diagnosis present

## 2014-09-02 DIAGNOSIS — R0602 Shortness of breath: Secondary | ICD-10-CM | POA: Diagnosis not present

## 2014-09-02 DIAGNOSIS — Z7982 Long term (current) use of aspirin: Secondary | ICD-10-CM

## 2014-09-02 DIAGNOSIS — E785 Hyperlipidemia, unspecified: Secondary | ICD-10-CM | POA: Diagnosis present

## 2014-09-02 DIAGNOSIS — R079 Chest pain, unspecified: Secondary | ICD-10-CM | POA: Diagnosis present

## 2014-09-02 DIAGNOSIS — R748 Abnormal levels of other serum enzymes: Secondary | ICD-10-CM | POA: Diagnosis present

## 2014-09-02 DIAGNOSIS — R7989 Other specified abnormal findings of blood chemistry: Secondary | ICD-10-CM | POA: Diagnosis not present

## 2014-09-02 DIAGNOSIS — R1033 Periumbilical pain: Secondary | ICD-10-CM | POA: Diagnosis not present

## 2014-09-02 DIAGNOSIS — G8929 Other chronic pain: Secondary | ICD-10-CM | POA: Diagnosis present

## 2014-09-02 DIAGNOSIS — R945 Abnormal results of liver function studies: Secondary | ICD-10-CM | POA: Diagnosis present

## 2014-09-02 DIAGNOSIS — Z881 Allergy status to other antibiotic agents status: Secondary | ICD-10-CM | POA: Diagnosis not present

## 2014-09-02 DIAGNOSIS — F4310 Post-traumatic stress disorder, unspecified: Secondary | ICD-10-CM | POA: Diagnosis present

## 2014-09-02 DIAGNOSIS — E1065 Type 1 diabetes mellitus with hyperglycemia: Secondary | ICD-10-CM | POA: Diagnosis present

## 2014-09-02 DIAGNOSIS — R109 Unspecified abdominal pain: Secondary | ICD-10-CM | POA: Diagnosis present

## 2014-09-02 DIAGNOSIS — Z90710 Acquired absence of both cervix and uterus: Secondary | ICD-10-CM

## 2014-09-02 DIAGNOSIS — E10319 Type 1 diabetes mellitus with unspecified diabetic retinopathy without macular edema: Secondary | ICD-10-CM | POA: Diagnosis present

## 2014-09-02 DIAGNOSIS — R42 Dizziness and giddiness: Secondary | ICD-10-CM

## 2014-09-02 DIAGNOSIS — R740 Nonspecific elevation of levels of transaminase and lactic acid dehydrogenase [LDH]: Secondary | ICD-10-CM | POA: Diagnosis not present

## 2014-09-02 DIAGNOSIS — E1042 Type 1 diabetes mellitus with diabetic polyneuropathy: Secondary | ICD-10-CM | POA: Diagnosis present

## 2014-09-02 DIAGNOSIS — Z794 Long term (current) use of insulin: Secondary | ICD-10-CM | POA: Diagnosis not present

## 2014-09-02 DIAGNOSIS — I2699 Other pulmonary embolism without acute cor pulmonale: Secondary | ICD-10-CM | POA: Diagnosis present

## 2014-09-02 DIAGNOSIS — M797 Fibromyalgia: Secondary | ICD-10-CM | POA: Diagnosis present

## 2014-09-02 DIAGNOSIS — R1110 Vomiting, unspecified: Secondary | ICD-10-CM

## 2014-09-02 LAB — URINALYSIS, ROUTINE W REFLEX MICROSCOPIC
Bilirubin Urine: NEGATIVE
Glucose, UA: >1000 mg/dL — AB
Hgb urine dipstick: NEGATIVE
Ketones, ur: NEGATIVE mg/dL
Leukocytes, UA: NEGATIVE
Nitrite: NEGATIVE
Protein, ur: NEGATIVE mg/dL
Specific Gravity, Urine: 1.027 (ref 1.005–1.030)
Urobilinogen, UA: 0.2 mg/dL (ref 0.0–1.0)
pH: 5.5 (ref 5.0–8.0)

## 2014-09-02 LAB — CBC
HCT: 40.7 % (ref 36.0–46.0)
Hemoglobin: 14.1 g/dL (ref 12.0–15.0)
MCH: 33.7 pg (ref 26.0–34.0)
MCHC: 34.6 g/dL (ref 30.0–36.0)
MCV: 97.4 fL (ref 78.0–100.0)
Platelets: 461 10*3/uL — ABNORMAL HIGH (ref 150–400)
RBC: 4.18 MIL/uL (ref 3.87–5.11)
RDW: 13.9 % (ref 11.5–15.5)
WBC: 12.8 10*3/uL — ABNORMAL HIGH (ref 4.0–10.5)

## 2014-09-02 LAB — COMPREHENSIVE METABOLIC PANEL
ALT: 348 U/L — ABNORMAL HIGH (ref 14–54)
AST: 228 U/L — ABNORMAL HIGH (ref 15–41)
Albumin: 3.2 g/dL — ABNORMAL LOW (ref 3.5–5.0)
Alkaline Phosphatase: 507 U/L — ABNORMAL HIGH (ref 38–126)
Anion gap: 17 — ABNORMAL HIGH (ref 5–15)
BUN: 6 mg/dL (ref 6–20)
CO2: 26 mmol/L (ref 22–32)
Calcium: 9.4 mg/dL (ref 8.9–10.3)
Chloride: 91 mmol/L — ABNORMAL LOW (ref 101–111)
Creatinine, Ser: 1.01 mg/dL — ABNORMAL HIGH (ref 0.44–1.00)
GFR calc Af Amer: >60 mL/min (ref >60)
GFR calc non Af Amer: >60 mL/min (ref >60)
Glucose, Bld: 362 mg/dL — ABNORMAL HIGH (ref 65–99)
Potassium: 4.7 mmol/L (ref 3.5–5.1)
Sodium: 134 mmol/L — ABNORMAL LOW (ref 135–145)
Total Bilirubin: 1.6 mg/dL — ABNORMAL HIGH (ref 0.3–1.2)
Total Protein: 8.1 g/dL (ref 6.5–8.1)

## 2014-09-02 LAB — ETHANOL: Alcohol, Ethyl (B): <5 mg/dL (ref <5)

## 2014-09-02 LAB — URINE MICROSCOPIC-ADD ON

## 2014-09-02 LAB — BASIC METABOLIC PANEL
Anion gap: 22 — ABNORMAL HIGH (ref 5–15)
BUN: 9 mg/dL (ref 6–20)
CO2: 20 mmol/L — ABNORMAL LOW (ref 22–32)
Calcium: 9.3 mg/dL (ref 8.9–10.3)
Chloride: 91 mmol/L — ABNORMAL LOW (ref 101–111)
Creatinine, Ser: 1.14 mg/dL — ABNORMAL HIGH (ref 0.44–1.00)
GFR calc Af Amer: >60 mL/min (ref >60)
GFR calc non Af Amer: 59 mL/min — ABNORMAL LOW (ref >60)
Glucose, Bld: 306 mg/dL — ABNORMAL HIGH (ref 65–99)
Potassium: 3.5 mmol/L (ref 3.5–5.1)
Sodium: 133 mmol/L — ABNORMAL LOW (ref 135–145)

## 2014-09-02 LAB — RAPID URINE DRUG SCREEN, HOSP PERFORMED
Amphetamines: NOT DETECTED
Barbiturates: NOT DETECTED
Benzodiazepines: POSITIVE — AB
Cocaine: NOT DETECTED
Opiates: NOT DETECTED
Tetrahydrocannabinol: NOT DETECTED

## 2014-09-02 LAB — ACETAMINOPHEN LEVEL: Acetaminophen (Tylenol), Serum: <10 ug/mL — ABNORMAL LOW (ref 10–30)

## 2014-09-02 LAB — SALICYLATE LEVEL: Salicylate Lvl: <4 mg/dL (ref 2.8–30.0)

## 2014-09-02 LAB — PROTIME-INR
INR: 1.03 (ref 0.00–1.49)
Prothrombin Time: 13.7 seconds (ref 11.6–15.2)

## 2014-09-02 LAB — CBG MONITORING, ED
Glucose-Capillary: 295 mg/dL — ABNORMAL HIGH (ref 65–99)
Glucose-Capillary: 473 mg/dL — ABNORMAL HIGH (ref 65–99)

## 2014-09-02 LAB — I-STAT TROPONIN, ED: Troponin i, poc: 0 ng/mL (ref 0.00–0.08)

## 2014-09-02 LAB — TROPONIN I: Troponin I: <0.03 ng/mL (ref <0.031)

## 2014-09-02 LAB — AMMONIA: Ammonia: 27 umol/L (ref 9–35)

## 2014-09-02 MED ORDER — ONDANSETRON HCL 4 MG/2ML IJ SOLN
4.0000 mg | Freq: Once | INTRAMUSCULAR | Status: AC
  Administered 2014-09-02: 4 mg via INTRAVENOUS
  Filled 2014-09-02: qty 2

## 2014-09-02 MED ORDER — INSULIN ASPART 100 UNIT/ML ~~LOC~~ SOLN
8.0000 [IU] | Freq: Once | SUBCUTANEOUS | Status: AC
  Administered 2014-09-02: 8 [IU] via SUBCUTANEOUS
  Filled 2014-09-02: qty 1

## 2014-09-02 MED ORDER — SODIUM CHLORIDE 0.9 % IV BOLUS (SEPSIS)
1000.0000 mL | Freq: Once | INTRAVENOUS | Status: AC
  Administered 2014-09-02: 1000 mL via INTRAVENOUS

## 2014-09-02 MED ORDER — SODIUM CHLORIDE 0.9 % IV SOLN: Freq: Once | INTRAVENOUS | Status: DC

## 2014-09-02 MED ORDER — MORPHINE SULFATE 4 MG/ML IJ SOLN
4.0000 mg | Freq: Once | INTRAMUSCULAR | Status: AC
  Administered 2014-09-02: 4 mg via INTRAVENOUS
  Filled 2014-09-02: qty 1

## 2014-09-02 NOTE — H&P (Addendum)
[facility] History and Physical  Maureen [name] EHM:[id number] DOB: 1970-03-02 DOA: 09/02/2014  Referring physician: ED physician PCP: [name], MD  Specialists:   Chief Complaint: Dizziness, nausea, vomiting, diarrhea, abdominal pain, chest pain, shortness breath  HPI: Maureen [name] is a 44 y.o. female with PMH of hyperlipidemia, type 1 diabetes, depression, anxiety, PTSD, migraine headache, history of head injury 2010, who presents with dizziness, nausea, vomiting, diarrhea, abdominal pain, chest pain, shortness breath.  Patient reports that she had nausea, vomiting, diarrhea and abdominal pain 3 weeks ago, which had improved significantly without any treatment, but since 3 days ago, she started having similar symptoms again, with nausea, vomiting, diarrhea and abdominal pain. She vomited once today, and had 4 bowel movement withs loose stool. No blood in the vomitus or stools. Her abdominal pain is located in the peri-umbilical area, and also in the right lower quadrant, constant, nonradiating. It is not aggravated or alleviated with any known factors. She states she has dizziness andvery poor balance recently. She had urinary incontinence once yesterday. Patient does not have fever or chills. She denies drinking alcohol. She states that she took 2 pills of Tylenol 1 month ago.   She also reports having chest pain, shortness breath. Her chest pain is located in center of chest, 10 out of 10 in severity, constant, pleuritic. It is aggravated by deep breath. Of note, she states that she traveled to and from [place] by driving 3 weeks ago. No tenderness over the calf areas. Patient does not have symptoms of UTI, unilateral weakness, rashes.  In ED, patient was found to have d-dimer elevation at 0.92. WBC 12.8, INR 1.03, negative troponin, UDS positive for benzo, negative urinalysis, normal temperature, no tachycardia, electrolytes okay. CT-head and MRI of brain are negative for  acute abnormalities. She was found to have abnormal liver function with ALP 507, AST 228, ALT 348 and total bilirubin 1.6 (her liver function test on 08/02/14 showed ALP 156, AST 21, ALT 16, and TB 0.5). Ammonium level 27. Chest x-ray is negative.  Where does patient live?   At home Can patient participate in ADLs?  Yes     Review of Systems:   General: no fevers, chills, no changes in body weight, has poor appetite, has fatigue HEENT: no blurry vision, hearing changes or sore throat Pulm: has dyspnea, no coughing, wheezing CV: has chest pain, no palpitations Abd: has nausea, vomiting, abdominal pain, diarrhea, no constipation GU: no dysuria, burning on urination, increased urinary frequency, hematuria  Ext: no leg edema Neuro: no unilateral weakness, numbness, or tingling, no vision change or hearing loss Skin: no rash MSK: No muscle spasm, no deformity, no limitation of range of movement in spin Heme: No easy bruising.  Travel history: No recent long distant travel.  Allergy:  Allergies  Allergen Reactions  . Cephalexin Rash    Pt was admitted to the hospital upon taking.  . Ibuprofen Nausea And Vomiting and Rash  . Meloxicam Nausea Only    Past Medical History  Diagnosis Date  . 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 Fibromyalgia   . Hyperlipidemia   . Abdominal pain   . Nausea & vomiting   . Biliary dyskinesia   . Depression   . Joint pain   . Skin rash     due to medications  . Post traumatic stress disorder (PTSD)   . Sleep trouble   . Major depressive disorder   . Cervical  strain   . Closed head injury 2010  . Renal stones   . Migraine     hx of none recent  . Arthritis   . Hypoglycemia   . Anxiety     Past Surgical History  Procedure Laterality Date  . Lipoma removed  yrs ago  . Knee surgery Left 2012  . Cesarean section  1997, 1999  . Cholecystectomy  02/10/2011    Procedure: LAPAROSCOPIC CHOLECYSTECTOMY WITH  INTRAOPERATIVE CHOLANGIOGRAM;  Surgeon: Judieth Keens, DO;  Location: [facility];  Service: General;  Laterality: N/A;  . Robotic assisted laparoscopic lysis of adhesion N/A 03/04/2012    Procedure: ROBOTIC ASSISTED LAPAROSCOPIC LYSIS OF EXTENSIVE ADHESIONS;  Surgeon: Claiborne Billings A. Pamala Hurry, MD;  Location: [facility];  Service: Gynecology;  Laterality: N/A;  . Tonsillectomy  2001  . Abdominal hysterectomy  2001  . Eye surgery  2006    laser eye surgery left eye  . Exploratory laparomty  Mar 04, 2012  . Laparoscopic appendectomy N/A 10/12/2012    Procedure: DIAGNOSTIC APPENDECTOMY LAPAROSCOPIC;  Surgeon: Madilyn Hook, DO;  Location: [facility];  Service: General;  Laterality: N/A;  . Appendectomy      Social History:  reports that she has never smoked. She has never used smokeless tobacco. She reports that she does not drink alcohol or use illicit drugs.  Family History:  Family History  Problem Relation Age of Onset  . Cancer Father     lymphoma  . Nephrolithiasis Father   . Vascular Disease Father   . Alcohol abuse Father   . Drug abuse Father   . Cancer Maternal Grandmother     colon  . Hypertension Mother   . Heart disease Mother   . Cataracts Mother   . Depression Mother   . Diabetes Brother   . Drug abuse Brother   . Mental illness Maternal Grandfather   . Cancer Maternal Grandfather   . Heart disease Paternal Grandmother   . Heart disease Paternal Grandfather   . Suicidality Maternal Uncle      Prior to Admission medications   Medication Sig Start Date End Date Taking? Authorizing Provider  aspirin EC 81 MG tablet Take 1 tablet (81 mg total) by mouth daily with breakfast. 10/16/13  Yes Kerrie Buffalo, NP  atorvastatin (LIPITOR) 40 MG tablet Take 1 tablet (40 mg total) by mouth daily. Resume home meds for high cholesterol 10/16/13  Yes Kerrie Buffalo, NP  clonazePAM (KLONOPIN) 2 MG tablet Take 1 tablet (2 mg total) by mouth at bedtime. Patient taking differently: Take 2 mg by mouth at  bedtime as needed for anxiety.  07/31/14 07/31/15 Yes Leonides Grills, MD  FLUoxetine (PROZAC) 40 MG capsule Take 1 capsule (40 mg total) by mouth daily. For depression 05/29/14  Yes Charlcie Cradle, MD  HUMALOG KWIKPEN 100 UNIT/ML KiwkPen Inject 8-10 Units into the skin 3 (three) times daily. 08/03/14  Yes Historical Provider, MD  insulin glargine (LANTUS) 100 UNIT/ML injection Inject 26 Units into the skin at bedtime.    Yes Historical Provider, MD  dicyclomine (BENTYL) 20 MG tablet Take 1 tablet (20 mg total) by mouth 2 (two) times daily. 08/02/14   Nicole Pisciotta, PA-C  insulin aspart (NOVOLOG) 100 UNIT/ML injection Inject 8 Units into the skin 3 (three) times daily with meals. 10/16/13   Kerrie Buffalo, NP  loperamide (IMODIUM) 2 MG capsule Take 1 capsule (2 mg total) by mouth 4 (four) times daily as needed for diarrhea or loose stools. 08/02/14  Nicole Pisciotta, PA-C  oxyCODONE-acetaminophen (PERCOCET/ROXICET) 5-325 MG per tablet 1 to 2 tabs PO q6hrs  PRN for pain 08/02/14   Monico Blitz, PA-C    Physical Exam: Filed Vitals:   09/03/14 0107 09/03/14 0115 09/03/14 0144 09/03/14 0153  BP:  130/79 139/73   Pulse: 73 75 72   Temp:   98.4 F (36.9 C)   TempSrc:   Oral   Resp: 15 21 16    Height:    5\' 4"  (1.626 m)  Weight:    75.342 kg (166 lb 1.6 oz)  SpO2: 100% 98% 100%    General: Not in acute distress HEENT:       Eyes: PERRL, EOMI, no scleral icterus.       ENT: No discharge from the ears and nose, no pharynx injection, no tonsillar enlargement.        Neck: No JVD, no bruit, no mass felt. Heme: No neck lymph node enlargement. Cardiac: S1/S2, RRR, No murmurs, No gallops or rubs. Pulm: No rales, wheezing, rhonchi or rubs. Abd: Soft, nondistended, tenderness over RLQ and periumbilical area, no rebound pain, no organomegaly, BS present. Ext: No pitting leg edema bilaterally. 2+DP/PT pulse bilaterally. Musculoskeletal: No joint deformities, No joint redness or warmth, no  limitation of ROM in spin. Skin: No rashes.  Neuro: Alert, oriented X3, cranial nerves II-XII grossly intact, muscle strength 5/5 in all extremities, sensation to light touch intact.  Psych: Patient is not psychotic, no suicidal or hemocidal ideation.  Labs on Admission:  Basic Metabolic Panel:  Recent Labs Lab 09/02/14 1547 09/02/14 1813  NA 133* 134*  K 3.5 4.7  CL 91* 91*  CO2 20* 26  GLUCOSE 306* 362*  BUN 9 6  CREATININE 1.14* 1.01*  CALCIUM 9.3 9.4   Liver Function Tests:  Recent Labs Lab 09/02/14 1813  AST 228*  ALT 348*  ALKPHOS 507*  BILITOT 1.6*  PROT 8.1  ALBUMIN 3.2*   No results for input(s): LIPASE, AMYLASE in the last 168 hours.  Recent Labs Lab 09/02/14 2010  AMMONIA 27   CBC:  Recent Labs Lab 09/02/14 1547  WBC 12.8*  HGB 14.1  HCT 40.7  MCV 97.4  PLT 461*   Cardiac Enzymes:  Recent Labs Lab 09/02/14 1813  TROPONINI <0.03    BNP (last 3 results)  Recent Labs  03/26/14 1531  BNP 27.9    ProBNP (last 3 results) No results for input(s): PROBNP in the last 8760 hours.  CBG:  Recent Labs Lab 09/02/14 1738 09/02/14 2241 09/03/14 0105  GLUCAP 295* 473* 221*    Radiological Exams on Admission: Dg Chest 2 View  09/02/2014   CLINICAL DATA:  Intermittent chest pain for 2 days  EXAM: CHEST  2 VIEW  COMPARISON:  07/28/2014  FINDINGS: The heart size and mediastinal contours are within normal limits. Both lungs are clear. The visualized skeletal structures are unremarkable.  IMPRESSION: No active cardiopulmonary disease.   Electronically Signed   By: Lahoma Crocker M.D.   On: 09/02/2014 15:32   Ct Head Wo Contrast  09/02/2014   CLINICAL DATA:  Dizziness.  Urinary incontinence.  Chest pain.  EXAM: CT HEAD WITHOUT CONTRAST  TECHNIQUE: Contiguous axial images were obtained from the base of the skull through the vertex without intravenous contrast.  COMPARISON:  CT head without contrast 10/30/2013.  FINDINGS: No acute cortical infarct,  hemorrhage, or mass lesion is present. The ventricles are of normal size. No significant extra-axial fluid collection is evident. The paranasal  sinuses and mastoid air cells are clear. The calvarium is intact. No significant extracranial soft tissue lesion is present. The globes and orbits are intact.  IMPRESSION: Negative CT of the head.   Electronically Signed   By: San Morelle M.D.   On: 09/02/2014 18:49   Mr Brain Wo Contrast  09/02/2014   CLINICAL DATA:  Dizziness and lightheadedness 1 trying to walk. Symptoms began when she sits up and are worse with standing.  EXAM: MRI HEAD WITHOUT CONTRAST  TECHNIQUE: Multiplanar, multiecho pulse sequences of the brain and surrounding structures were obtained without intravenous contrast.  COMPARISON:  None.  FINDINGS: No acute infarct, hemorrhage, or mass lesion is present. The ventricles are of normal size. No significant extraaxial fluid collection is present.  Flow is present in the major intracranial arteries. No significant white matter disease is present. The globes and orbits are intact. The paranasal sinuses and mastoid air cells are clear.  Skullbase is within normal limits. Midline structures are unremarkable.  IMPRESSION: Negative MRI of the brain.   Electronically Signed   By: San Morelle M.D.   On: 09/02/2014 21:34   US Abdomen Limited Ruq  09/03/2014   CLINICAL DATA:  Right upper quadrant pain for 2 days with transaminitis  EXAM: US ABDOMEN LIMITED - RIGHT UPPER QUADRANT  COMPARISON:  Abdominal CT from 1 month ago  FINDINGS: Gallbladder:  Cholecystectomy.  No gallbladder fossa fluid collection.  Common bile duct:  Diameter: 4 mm.  Liver:  No focal lesion identified. Within normal limits in parenchymal echogenicity.  Fluid noted around the pancreatic head is ovoid and consistent with second portion duodenum. No definitive changes of pancreatitis.  IMPRESSION: Negative right upper quadrant ultrasound after cholecystectomy.    Electronically Signed   By: Monte Fantasia M.D.   On: 09/03/2014 00:18    EKG: Independently reviewed. No ischemic change  Assessment/Plan Principal Problem:   Nausea vomiting and diarrhea Active Problems:   DM type 1 (diabetes mellitus, type 1)   Fibromyalgia   Diabetic retinopathy   Chest pain   Major depression   GAD (generalized anxiety disorder)   PTSD (post-traumatic stress disorder)   Transaminitis   Abdominal pain   Abnormal LFTs   Dizziness  Nausea vomiting, diarrhea and abdominal pain: Etiology is not clear. Potential differential diagnoses include gastroenteritis, C. difficile colitis, pancreatitis, hepatitis, gallstone or cholecystitis. -will admit to tele bed -Zofran for nausea, morphine for pain -Abdominal ultrasound -C. difficile PCR, GI pathogen panel --will get Procalcitonin and trend lactic acid levels -IVF: 2L of NS bolus in ED, followed by 100cc/h  Abnomal LFT: ALP 507, AST 228, ALT 348 and total bilirubin 1.6 (her liver function test on 08/02/14 showed ALP 156, AST 21, ALT 16, and TB 0.5). GI was consulted. -f/u GI recommendation.  -Check Tylenol level, salicylate level, alcohol level -Hepatitis panel  Chest pain: Patient has pleuritic chest pain, plus recent long-distance traveling and has elevated d-dimer, need to rule out pulmonary embolism. Given her history of diabetes and hyperlipidemia, also need to rule out ACS though less likely since her CP is pleuritic in nature.. Currently EKG has no ischemic change. -will get CAT to r/o PE -check trop x 3 -ekg in AM  Addendum: CAT showed Single subsegmental pulmonary embolism to the right upper lobe. -start Lovenox -will get 2E echo -LE doppler -hypercoagulable panel   Dizziness: CT head and MRI of brain are negative. It is likely due to orthostatic it is secondary to dehydration because of  her nausea, vomiting and diarrhea. -will check ortho vital signs -IVF as above -PT/OT  DM-II: Last A1c 8.5,  not well controled. Patient is taking Lantus at home -Continue Lantus 26 units daily -SSI  Depression and anxiety: Stable, no suicidal or homicidal ideations. -Continue home medications: Klonopin, Prozac  HLD: Last LDL was 179 on 05/18/12 -Continue home medications: Lipitor -Check FLP   DVT ppx: SQ Heparin    Code Status: Full code Family Communication: None at bed side.  Disposition Plan: Admit to inpatient   Date of Service 09/03/2014    Ivor Costa [facility] Pager [phone]  If 7PM-7AM, please contact night-coverage [email or web address] Password [facility] 09/03/2014, 3:25 AM

## 2014-09-02 NOTE — ED Notes (Signed)
Patient with reported dizziness, running into things and urinary incontinence.  Patient also reports period of chest pain.  Patient is alert.  States she has had decreased appetite.  Patient is drinking.  Patient denies trauma. Patient states she has had n/v/d

## 2014-09-02 NOTE — Consult Note (Addendum)
Neurology Consultation Reason for Consult: Dizziness Referring Physician: Alyson Locket  CC: Dizziness  History is obtained from:patient  HPI: Maureen [name] is a 44 y.o. female with a history of diarrhea that was present three weeks ago and has been present again over the past few days. She also states that her sugars have been up and she rarely sees a sugar above 200, but today it was in the 300s. She awoke having had urinary incontinence this morning and this is what propmted her to come in. Also, she has been a little "spacy" over the past week or so.   Her main complaint, however, is feeling of dizziness which she describes as lightheadedness when she is trying to walk. She has none of this when lying down, but it begins when she sits up and gets worse with standing. It does not get better as she stands for longer. She also describes other symptoms including pleuritic chest pain, headache, diarrhea over the past few days.   She has not had any other episodes of urinary incontinence but did have an episode of fecal incontinence associated with diarrhea a few weeks ago.    LKW: unclear, at least > 1 week tpa given?: no, out of window   ROS: A 14 point ROS was performed and is negative except as noted in the HPI.   Past Medical History  Diagnosis Date  . 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 Fibromyalgia   . Hyperlipidemia   . Abdominal pain   . Nausea & vomiting   . Biliary dyskinesia   . Depression   . Joint pain   . Skin rash     due to medications  . Post traumatic stress disorder (PTSD)   . Sleep trouble   . Major depressive disorder   . Cervical strain   . Closed head injury 2010  . Renal stones   . Migraine     hx of none recent  . Arthritis   . Hypoglycemia   . Anxiety     Family History: Heart disease  Social History: Tob: denies  Exam: Current vital signs: BP 138/69 mmHg  Pulse 85  Resp 22  SpO2 96% Vital signs in  last 24 hours: Pulse Rate:  [77-85] 85 (08/14 1930) Resp:  [22] 22 (08/14 1800) BP: (102-142)/(59-69) 138/69 mmHg (08/14 1930) SpO2:  [96 %-100 %] 96 % (08/14 1930)   Physical Exam  Constitutional: Appears well-developed and well-nourished.  Psych: Affect appropriate to situation Eyes: No scleral injection HENT: No OP obstrucion Head: Normocephalic.  Cardiovascular: Normal rate and regular rhythm.  Respiratory: Effort normal and breath sounds normal to anterior ascultation GI: Soft.  No distension. RUQ tenderness  Skin: WDI  Neuro: Mental Status: Patient is awake, alert, oriented to person, place, month, year, and situation. Patient is able to give a clear and coherent history. No signs of aphasia or neglect Cranial Nerves: II: Visual Fields are full. Pupils are equal, round, and reactive to light.   III,IV, VI: EOMI without ptosis or diploplia.  V: Facial sensation is symmetric to temperature VII: Facial movement is symmetric.  VIII: hearing is intact to voice X: Uvula elevates symmetrically XI: Shoulder shrug is symmetric. XII: tongue is midline without atrophy or fasciculations.  Motor: Tone is normal. Bulk is normal. 5/5 strength was present in all four extremities.  Sensory: Sensation is symmetric to light touch and temperature in the arms and legs. Deep Tendon Reflexes: 2+ and  symmetric in the biceps and patellae.  Cerebellar: FNF  intact bilaterally   I have reviewed labs in epic and the results pertinent to this consultation are: ua - > 1000 glucose CMP - glucose 362 Elevated Transaminases  I have reviewed the images obtained: CT head - negative  Impression: 44 yo F with non-specific dizziness with standing. My suspicion is that she has a systemic problem wither related to diarrhea or polyuria resulting in orthostasis. I am concerned about her transaminitis and wonder if this could be related to her dizziness and mild confusion.   This being said, she is at  risk for stroke given her history of DM, and if she is not orthostatic then I do think that ruling out a stroke with an MRI may be prudent. Liver dysfunction could certainly be related to her mild confusion.   Recommendations: 1) Orthostatic vital signs 2) Ammonia 3) If no clear cause identified then MRI brain would be prudent.  4) Hepatitis serology, further workup of transaminitis per ER.   Roland Rack, MD [facility] [phone]  If 7pm- 7am, please page neurology on call as listed in [place].

## 2014-09-02 NOTE — ED Provider Notes (Signed)
CSN: [id number]     Arrival date & time 09/02/14  1429 History   First MD Initiated Contact with Patient 09/02/14 1649     Chief Complaint  Patient presents with  . Dizziness  . Chest Pain  . Urinary Incontinence     HPI  Patient presents with a primary complaint of difficulty with her balance and coordination.  Reports a change in her baseline health and function approximately 7-10 days ago. She has had progressive difficulty with balance and coordination. States this is the point that she walks or runs into things or feel like she has to steady herself. Does not describe lightheadedness. Does not describe frank vertigo or hallucination of movement. This is been to the point that her signs were 42 and 19 have asked her to check her blood sugars because she is diabetic. She has not had abnormal with low blood sugars.  She states that this morning she woke And found that she had been incontinent during the night. She has not urinated the remainder of the day today and has not had any additional episodes of incontinence. Had an episode of rectal incontinence approximately 3 weeks ago during an out of diarrhea. During that evaluation had a normal MRI of her lumbar spine. Has maintained rectal continent since that time. She states that she has normal perineal sensation.  No history of neurological conditions including EMS. However, she states she had an episode of hypoglycemia and had a trauma falling down some stairs and had a subarachnoid and intraparenchymal hemorrhage over 10 years ago. She recovered without sequelae from this.  She states that she has had some difficulty with confusion. She states that her voice of also told her at times" mom that doesn't make sense". She feels that she is giving appropriate answers or follow conversation they state that her answers sometimes are "nonsense".  He states she has had some discomfort in her chest that reminds her of heartburn and burning and  occasionally sharp discomfort that she has had throughout the course of the day today.  Type I diabetic onset age 24. Now 44 years old. Does have some neuropathy reported as slightly decreased sensation to the feet. Denies pain or tingling pins and needle sensation.  Has had no blood sugar readings under 60. Has had no readings over 400. Typically she is less than 200.  Past Medical History  Diagnosis Date  . 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 Fibromyalgia   . Hyperlipidemia   . Abdominal pain   . Nausea & vomiting   . Biliary dyskinesia   . Depression   . Joint pain   . Skin rash     due to medications  . Post traumatic stress disorder (PTSD)   . Sleep trouble   . Major depressive disorder   . Cervical strain   . Closed head injury 2010  . Renal stones   . Migraine     hx of none recent  . Arthritis   . Hypoglycemia   . Anxiety    Past Surgical History  Procedure Laterality Date  . Lipoma removed  yrs ago  . Knee surgery Left 2012  . Cesarean section  1997, 1999  . Cholecystectomy  02/10/2011    Procedure: LAPAROSCOPIC CHOLECYSTECTOMY WITH INTRAOPERATIVE CHOLANGIOGRAM;  Surgeon: Judieth Keens, DO;  Location: [facility];  Service: General;  Laterality: N/A;  . Robotic assisted laparoscopic lysis of adhesion N/A 03/04/2012    Procedure:  ROBOTIC ASSISTED LAPAROSCOPIC LYSIS OF EXTENSIVE ADHESIONS;  Surgeon: Claiborne Billings A. Pamala Hurry, MD;  Location: [facility];  Service: Gynecology;  Laterality: N/A;  . Tonsillectomy  2001  . Abdominal hysterectomy  2001  . Eye surgery  2006    laser eye surgery left eye  . Exploratory laparomty  Mar 04, 2012  . Laparoscopic appendectomy N/A 10/12/2012    Procedure: DIAGNOSTIC APPENDECTOMY LAPAROSCOPIC;  Surgeon: Madilyn Hook, DO;  Location: [facility];  Service: General;  Laterality: N/A;  . Appendectomy     Family History  Problem Relation Age of Onset  . Cancer Father     lymphoma  . Nephrolithiasis Father   .  Vascular Disease Father   . Alcohol abuse Father   . Drug abuse Father   . Cancer Maternal Grandmother     colon  . Hypertension Mother   . Heart disease Mother   . Cataracts Mother   . Depression Mother   . Diabetes Brother   . Drug abuse Brother   . Mental illness Maternal Grandfather   . Cancer Maternal Grandfather   . Heart disease Paternal Grandmother   . Heart disease Paternal Grandfather   . Suicidality Maternal Uncle    Social History  Substance Use Topics  . Smoking status: Never Smoker   . Smokeless tobacco: Never Used  . Alcohol Use: No   OB History    No data available     Review of Systems  Constitutional: Negative for fever, chills, diaphoresis, appetite change and fatigue.  HENT: Negative for mouth sores, sore throat and trouble swallowing.   Eyes: Negative for visual disturbance.  Respiratory: Negative for cough, chest tightness, shortness of breath and wheezing.   Cardiovascular: Negative for chest pain.  Gastrointestinal: Negative for nausea, vomiting, abdominal pain, diarrhea and abdominal distention.  Endocrine: Negative for polydipsia, polyphagia and polyuria.  Genitourinary: Negative for dysuria, frequency and hematuria.       Urinary incontinence 1.  Musculoskeletal: Positive for gait problem.  Skin: Negative for color change, pallor and rash.  Neurological: Positive for headaches. Negative for dizziness, syncope and light-headedness.  Hematological: Does not bruise/bleed easily.  Psychiatric/Behavioral: Positive for confusion. Negative for behavioral problems.      Allergies  Cephalexin; Ibuprofen; and Meloxicam  Home Medications   Prior to Admission medications   Medication Sig Start Date End Date Taking? Authorizing Provider  aspirin EC 81 MG tablet Take 1 tablet (81 mg total) by mouth daily with breakfast. 10/16/13  Yes Kerrie Buffalo, NP  atorvastatin (LIPITOR) 40 MG tablet Take 1 tablet (40 mg total) by mouth daily. Resume home meds  for high cholesterol 10/16/13  Yes Kerrie Buffalo, NP  clonazePAM (KLONOPIN) 2 MG tablet Take 1 tablet (2 mg total) by mouth at bedtime. Patient taking differently: Take 2 mg by mouth at bedtime as needed for anxiety.  07/31/14 07/31/15 Yes Leonides Grills, MD  FLUoxetine (PROZAC) 40 MG capsule Take 1 capsule (40 mg total) by mouth daily. For depression 05/29/14  Yes Charlcie Cradle, MD  HUMALOG KWIKPEN 100 UNIT/ML KiwkPen Inject 8-10 Units into the skin 3 (three) times daily. 08/03/14  Yes Historical Provider, MD  insulin glargine (LANTUS) 100 UNIT/ML injection Inject 26 Units into the skin at bedtime.    Yes Historical Provider, MD  dicyclomine (BENTYL) 20 MG tablet Take 1 tablet (20 mg total) by mouth 2 (two) times daily. 08/02/14   Nicole Pisciotta, PA-C  insulin aspart (NOVOLOG) 100 UNIT/ML injection Inject 8 Units into the skin  3 (three) times daily with meals. 10/16/13   Kerrie Buffalo, NP  loperamide (IMODIUM) 2 MG capsule Take 1 capsule (2 mg total) by mouth 4 (four) times daily as needed for diarrhea or loose stools. 08/02/14   Nicole Pisciotta, PA-C  oxyCODONE-acetaminophen (PERCOCET/ROXICET) 5-325 MG per tablet 1 to 2 tabs PO q6hrs  PRN for pain 08/02/14   Nicole Pisciotta, PA-C   BP 141/61 mmHg  Pulse 65  Temp(Src) 98.4 F (36.9 C) (Oral)  Resp 18  Ht 5\' 4"  (1.626 m)  Wt 166 lb 1.6 oz (75.342 kg)  BMI 28.50 kg/m2  SpO2 95% Physical Exam  Constitutional: She is oriented to person, place, and time. She appears well-developed and well-nourished. No distress.  HENT:  Head: Normocephalic.  Eyes: Conjunctivae are normal. Pupils are equal, round, and reactive to light. No scleral icterus.  Neck: Normal range of motion. Neck supple. No thyromegaly present.  Cardiovascular: Normal rate and regular rhythm.  Exam reveals no gallop and no friction rub.   No murmur heard. Pulmonary/Chest: Effort normal and breath sounds normal. No respiratory distress. She has no wheezes. She has no rales.   Abdominal: Soft. Bowel sounds are normal. She exhibits no distension. There is no tenderness. There is no rebound.  Musculoskeletal: Normal range of motion.  Neurological: She is alert and oriented to person, place, and time.  No cranial nerve deficits. No strength or motor deficits of extremities. 0-1+ symmetric lower extremity reflexes. No section intact. She has a slight ataxic gait. Tends to veer to the left. No nystagmus.  Skin: Skin is warm and dry. No rash noted.  Psychiatric: She has a normal mood and affect. Her behavior is normal.    ED Course  Procedures (including critical care time) Labs Review Labs Reviewed  BASIC METABOLIC PANEL - Abnormal; Notable for the following:    Sodium 133 (*)    Chloride 91 (*)    CO2 20 (*)    Glucose, Bld 306 (*)    Creatinine, Ser 1.14 (*)    GFR calc non Af Amer 59 (*)    Anion gap 22 (*)    All other components within normal limits  CBC - Abnormal; Notable for the following:    WBC 12.8 (*)    Platelets 461 (*)    All other components within normal limits  URINALYSIS, ROUTINE W REFLEX MICROSCOPIC (NOT AT [facility]) - Abnormal; Notable for the following:    Glucose, UA >1000 (*)    All other components within normal limits  COMPREHENSIVE METABOLIC PANEL - Abnormal; Notable for the following:    Sodium 134 (*)    Chloride 91 (*)    Glucose, Bld 362 (*)    Creatinine, Ser 1.01 (*)    Albumin 3.2 (*)    AST 228 (*)    ALT 348 (*)    Alkaline Phosphatase 507 (*)    Total Bilirubin 1.6 (*)    Anion gap 17 (*)    All other components within normal limits  URINE RAPID DRUG SCREEN, HOSP PERFORMED - Abnormal; Notable for the following:    Benzodiazepines POSITIVE (*)    All other components within normal limits  URINE MICROSCOPIC-ADD ON - Abnormal; Notable for the following:    Squamous Epithelial / LPF FEW (*)    All other components within normal limits  ACETAMINOPHEN LEVEL - Abnormal; Notable for the following:    Acetaminophen  (Tylenol), Serum <10 (*)    All other components within normal limits  D-DIMER, QUANTITATIVE (NOT AT [facility]) - Abnormal; Notable for the following:    D-Dimer, Quant 0.92 (*)    All other components within normal limits  LACTIC ACID, PLASMA - Abnormal; Notable for the following:    Lactic Acid, Venous 2.3 (*)    All other components within normal limits  LACTIC ACID, PLASMA - Abnormal; Notable for the following:    Lactic Acid, Venous 2.3 (*)    All other components within normal limits  CBC - Abnormal; Notable for the following:    WBC 10.7 (*)    RBC 3.68 (*)    HCT 35.7 (*)    All other components within normal limits  LIPASE, BLOOD - Abnormal; Notable for the following:    Lipase 61 (*)    All other components within normal limits  ANTITHROMBIN III - Abnormal; Notable for the following:    AntiThromb III Func 121 (*)    All other components within normal limits  PROTEIN C, TOTAL - Abnormal; Notable for the following:    Protein C, Total 157 (*)    All other components within normal limits  COMPREHENSIVE METABOLIC PANEL - Abnormal; Notable for the following:    Glucose, Bld 244 (*)    Calcium 8.3 (*)    Total Protein 5.8 (*)    Albumin 2.4 (*)    AST 105 (*)    ALT 222 (*)    Alkaline Phosphatase 343 (*)    All other components within normal limits  LIPID PANEL - Abnormal; Notable for the following:    Cholesterol 235 (*)    LDL Cholesterol 150 (*)    All other components within normal limits  GLUCOSE, CAPILLARY - Abnormal; Notable for the following:    Glucose-Capillary 197 (*)    All other components within normal limits  GLUCOSE, CAPILLARY - Abnormal; Notable for the following:    Glucose-Capillary 132 (*)    All other components within normal limits  GLUCOSE, CAPILLARY - Abnormal; Notable for the following:    Glucose-Capillary 116 (*)    All other components within normal limits  COMPREHENSIVE METABOLIC PANEL - Abnormal; Notable for the following:    Glucose, Bld  244 (*)    BUN <5 (*)    Calcium 8.7 (*)    Albumin 2.6 (*)    AST 252 (*)    ALT 234 (*)    Alkaline Phosphatase 378 (*)    All other components within normal limits  GLUCOSE, CAPILLARY - Abnormal; Notable for the following:    Glucose-Capillary 245 (*)    All other components within normal limits  GLUCOSE, CAPILLARY - Abnormal; Notable for the following:    Glucose-Capillary 146 (*)    All other components within normal limits  GLUCOSE, CAPILLARY - Abnormal; Notable for the following:    Glucose-Capillary 35 (*)    All other components within normal limits  GLUCOSE, CAPILLARY - Abnormal; Notable for the following:    Glucose-Capillary 43 (*)    All other components within normal limits  COMPREHENSIVE METABOLIC PANEL - Abnormal; Notable for the following:    BUN 5 (*)    Calcium 8.7 (*)    Albumin 2.6 (*)    AST 103 (*)    ALT 169 (*)    Alkaline Phosphatase 350 (*)    All other components within normal limits  GLUCOSE, CAPILLARY - Abnormal; Notable for the following:    Glucose-Capillary 258 (*)    All other components within normal limits  CBG MONITORING, ED - Abnormal; Notable for the following:    Glucose-Capillary 295 (*)    All other components within normal limits  CBG MONITORING, ED - Abnormal; Notable for the following:    Glucose-Capillary 473 (*)    All other components within normal limits  CBG MONITORING, ED - Abnormal; Notable for the following:    Glucose-Capillary 221 (*)    All other components within normal limits  CULTURE, BLOOD (ROUTINE X 2)  CULTURE, BLOOD (ROUTINE X 2)  C DIFFICILE QUICK SCREEN W PCR REFLEX  TROPONIN I  HEPATITIS PANEL, ACUTE  PROTIME-INR  AMMONIA  ETHANOL  SALICYLATE LEVEL  HIV ANTIBODY (ROUTINE TESTING)  TROPONIN I  TROPONIN I  BETA-2-GLYCOPROTEIN I ABS, IGG/M/A  HOMOCYSTEINE  CARDIOLIPIN ANTIBODIES, IGG, IGM, IGA  PROCALCITONIN  APTT  GLUCOSE, CAPILLARY  GLUCOSE, CAPILLARY  GI PATHOGEN PANEL BY PCR, STOOL   PROTEIN C ACTIVITY  PROTEIN S ACTIVITY  PROTEIN S, TOTAL  LUPUS ANTICOAGULANT PANEL  FACTOR 5 LEIDEN  PROTHROMBIN GENE MUTATION  I-STAT TROPOININ, ED    Imaging Review Ct Abdomen Pelvis W Contrast  09/03/2014   CLINICAL DATA:  Nausea/vomiting, diarrhea, right lower abdominal pain. Prior cholecystectomy, appendectomy, and hysterectomy.  EXAM: CT ABDOMEN AND PELVIS WITH CONTRAST  TECHNIQUE: Multidetector CT imaging of the abdomen and pelvis was performed using the standard protocol following bolus administration of intravenous contrast.  CONTRAST:  113mL OMNIPAQUE IOHEXOL 300 MG/ML  SOLN  COMPARISON:  None.  FINDINGS: Lower chest:  Trace bilateral pleural effusions.  Hepatobiliary: Liver is within normal limits. No suspicious/enhancing hepatic lesions.  Status post cholecystectomy. No intrahepatic or extrahepatic ductal dilatation.  Pancreas: Within normal limits.  Spleen: Within normal limits.  Adrenals/Urinary Tract: Adrenal glands are within normal limits.  Kidneys are within normal limits.  No hydronephrosis.  Bladder is mildly thick-walled decreased although underdistended. Excretory contrast from recent CTA chest.  Stomach/Bowel: Stomach is within normal limits.  Visualized bowel is unremarkable.  Prior appendectomy.  Vascular/Lymphatic: No evidence of abdominal aortic aneurysm.  No suspicious abdominopelvic lymphadenopathy.  Reproductive: Suspected prior supracervical hysterectomy.  No adnexal masses.  Other: Trace pelvic ascites.  Musculoskeletal: Visualized osseous structures are within normal limits.  IMPRESSION: No evidence of bowel obstruction.  Status post cholecystectomy, appendectomy, and hysterectomy.  No CT findings to account for the patient's lower abdominal pain.   Electronically Signed   By: Julian Hy M.D.   On: 09/03/2014 18:14   I, Jeneen Rinks, Nathen Balaban JOSEPH, personally reviewed and evaluated these images and lab results as part of my medical decision-making.   EKG  Interpretation   Date/Time:  Sunday September 02 2014 14:37:30 EDT Ventricular Rate:  87 PR Interval:  118 QRS Duration: 80 QT Interval:  372 QTC Calculation: 447 R Axis:   74 Text Interpretation:  Normal sinus rhythm Septal infarct , age  undetermined Abnormal ECG ED PHYSICIAN INTERPRETATION AVAILABLE IN [place]  [place] Confirmed by TEST, Record ([id number]) on 09/03/2014 6:54:21 AM      MDM   Final diagnoses:  Transaminitis   With ataxia and incontinence normal pressure hydrocephalus is in the differential. She is rather young. CT is requested and pending. Her chest pain sounds like it is very likely GI. EKG and troponin are related. Urine and metabolic panel pending at this point as well.  22:20:  Patient seen in consultation with Dr. Leonel Ramsay. I appreciate his input. We've discussed the patient at length. MRI shows no acute findings CT shows no acute findings.   Patient's  incontinence may very well be explained by increased fluid intake and uncontrolled sugar and polyuria/glucosuria.  She has not had any additional fecal incontinence since her episode of diarrhea several weeks ago. She has re-developed diarrhea here and has elevated liver enzymes. This may be viral process and may be acute viral hepatitis. She recently separated from her husband and has concerns about some potential abnormal "behavior" on the part of her previous sexual partner/husband. No history of blood transfusions IV drug use or other high risk sexual behaviors. No travel or food to predispose to hepatitis A. At this point she remains dizzy. Has had insulin and fluids. Her liver enzymes are elevated and acute hepatitis profile is pending.   She is starting to complain more of abdominal pain. Her ammonia is normal. I requested an ultrasound of her abdomen. I placed a call to hospitalist regarding admission as she remains symptomatic with her dizziness, lightheadedness, and is worse with attempting to  ambulate.    Tanna Furry, MD 09/05/14 [phone]

## 2014-09-03 ENCOUNTER — Encounter (HOSPITAL_COMMUNITY): Payer: Self-pay | Admitting: Radiology

## 2014-09-03 ENCOUNTER — Inpatient Hospital Stay (HOSPITAL_COMMUNITY): Payer: BLUE CROSS/BLUE SHIELD

## 2014-09-03 ENCOUNTER — Ambulatory Visit (HOSPITAL_COMMUNITY): Payer: BLUE CROSS/BLUE SHIELD

## 2014-09-03 DIAGNOSIS — R109 Unspecified abdominal pain: Secondary | ICD-10-CM | POA: Diagnosis present

## 2014-09-03 DIAGNOSIS — R112 Nausea with vomiting, unspecified: Secondary | ICD-10-CM | POA: Diagnosis present

## 2014-09-03 DIAGNOSIS — I2699 Other pulmonary embolism without acute cor pulmonale: Secondary | ICD-10-CM

## 2014-09-03 DIAGNOSIS — R945 Abnormal results of liver function studies: Secondary | ICD-10-CM | POA: Diagnosis present

## 2014-09-03 DIAGNOSIS — R197 Diarrhea, unspecified: Secondary | ICD-10-CM

## 2014-09-03 DIAGNOSIS — R42 Dizziness and giddiness: Secondary | ICD-10-CM

## 2014-09-03 DIAGNOSIS — R7989 Other specified abnormal findings of blood chemistry: Secondary | ICD-10-CM | POA: Diagnosis present

## 2014-09-03 LAB — COMPREHENSIVE METABOLIC PANEL
ALT: 222 U/L — ABNORMAL HIGH (ref 14–54)
AST: 105 U/L — ABNORMAL HIGH (ref 15–41)
Albumin: 2.4 g/dL — ABNORMAL LOW (ref 3.5–5.0)
Alkaline Phosphatase: 343 U/L — ABNORMAL HIGH (ref 38–126)
Anion gap: 12 (ref 5–15)
BUN: 7 mg/dL (ref 6–20)
CO2: 24 mmol/L (ref 22–32)
Calcium: 8.3 mg/dL — ABNORMAL LOW (ref 8.9–10.3)
Chloride: 102 mmol/L (ref 101–111)
Creatinine, Ser: 0.81 mg/dL (ref 0.44–1.00)
GFR calc Af Amer: >60 mL/min (ref >60)
GFR calc non Af Amer: >60 mL/min (ref >60)
Glucose, Bld: 244 mg/dL — ABNORMAL HIGH (ref 65–99)
Potassium: 3.7 mmol/L (ref 3.5–5.1)
Sodium: 138 mmol/L (ref 135–145)
Total Bilirubin: 0.7 mg/dL (ref 0.3–1.2)
Total Protein: 5.8 g/dL — ABNORMAL LOW (ref 6.5–8.1)

## 2014-09-03 LAB — LIPID PANEL
Cholesterol: 235 mg/dL — ABNORMAL HIGH (ref 0–200)
HDL: 62 mg/dL (ref >40)
LDL Cholesterol: 150 mg/dL — ABNORMAL HIGH (ref 0–99)
Total CHOL/HDL Ratio: 3.8 RATIO
Triglycerides: 113 mg/dL (ref <150)
VLDL: 23 mg/dL (ref 0–40)

## 2014-09-03 LAB — D-DIMER, QUANTITATIVE: D-Dimer, Quant: 0.92 ug/mL-FEU — ABNORMAL HIGH (ref 0.00–0.48)

## 2014-09-03 LAB — CBC
HCT: 35.7 % — ABNORMAL LOW (ref 36.0–46.0)
Hemoglobin: 12 g/dL (ref 12.0–15.0)
MCH: 32.6 pg (ref 26.0–34.0)
MCHC: 33.6 g/dL (ref 30.0–36.0)
MCV: 97 fL (ref 78.0–100.0)
Platelets: 395 10*3/uL (ref 150–400)
RBC: 3.68 MIL/uL — ABNORMAL LOW (ref 3.87–5.11)
RDW: 14.1 % (ref 11.5–15.5)
WBC: 10.7 10*3/uL — ABNORMAL HIGH (ref 4.0–10.5)

## 2014-09-03 LAB — GLUCOSE, CAPILLARY
Glucose-Capillary: 197 mg/dL — ABNORMAL HIGH (ref 65–99)
Glucose-Capillary: 132 mg/dL — ABNORMAL HIGH (ref 65–99)
Glucose-Capillary: 116 mg/dL — ABNORMAL HIGH (ref 65–99)
Glucose-Capillary: 88 mg/dL (ref 65–99)

## 2014-09-03 LAB — TROPONIN I
Troponin I: <0.03 ng/mL (ref <0.031)
Troponin I: <0.03 ng/mL (ref <0.031)

## 2014-09-03 LAB — HEPATITIS PANEL, ACUTE
HCV Ab: <0.1 s/co ratio (ref 0.0–0.9)
Hep A IgM: NEGATIVE
Hep B C IgM: NEGATIVE
Hepatitis B Surface Ag: NEGATIVE

## 2014-09-03 LAB — LIPASE, BLOOD: Lipase: 61 U/L — ABNORMAL HIGH (ref 22–51)

## 2014-09-03 LAB — PROCALCITONIN: Procalcitonin: 1.75 ng/mL

## 2014-09-03 LAB — HIV ANTIBODY (ROUTINE TESTING W REFLEX): HIV Screen 4th Generation wRfx: NONREACTIVE

## 2014-09-03 LAB — APTT: aPTT: 33 seconds (ref 24–37)

## 2014-09-03 LAB — LACTIC ACID, PLASMA
Lactic Acid, Venous: 2.3 mmol/L (ref 0.5–2.0)
Lactic Acid, Venous: 2.3 mmol/L (ref 0.5–2.0)

## 2014-09-03 LAB — CBG MONITORING, ED: Glucose-Capillary: 221 mg/dL — ABNORMAL HIGH (ref 65–99)

## 2014-09-03 LAB — ANTITHROMBIN III: AntiThromb III Func: 121 % — ABNORMAL HIGH (ref 75–120)

## 2014-09-03 MED ORDER — OXYCODONE-ACETAMINOPHEN 5-325 MG PO TABS: 2.0000 | ORAL_TABLET | Freq: Four times a day (QID) | ORAL | Status: DC | PRN

## 2014-09-03 MED ORDER — IOHEXOL 350 MG/ML SOLN
100.0000 mL | Freq: Once | INTRAVENOUS | Status: AC | PRN
  Administered 2014-09-03: 100 mL via INTRAVENOUS

## 2014-09-03 MED ORDER — CLONAZEPAM 1 MG PO TABS
2.0000 mg | ORAL_TABLET | Freq: Every day | ORAL | Status: DC
  Administered 2014-09-03 (×2): 2 mg via ORAL
  Administered 2014-09-04: 1 mg via ORAL
  Filled 2014-09-03 (×3): qty 2

## 2014-09-03 MED ORDER — SODIUM CHLORIDE 0.9 % IV BOLUS (SEPSIS)
1000.0000 mL | Freq: Once | INTRAVENOUS | Status: AC
  Administered 2014-09-03: 1000 mL via INTRAVENOUS

## 2014-09-03 MED ORDER — MORPHINE SULFATE 2 MG/ML IJ SOLN
2.0000 mg | INTRAMUSCULAR | Status: DC | PRN
  Administered 2014-09-03 (×2): 2 mg via INTRAVENOUS
  Filled 2014-09-03 (×2): qty 1

## 2014-09-03 MED ORDER — ATORVASTATIN CALCIUM 40 MG PO TABS: 40.0000 mg | ORAL_TABLET | Freq: Every day | ORAL | Status: DC

## 2014-09-03 MED ORDER — ONDANSETRON HCL 4 MG/2ML IJ SOLN: 4.0000 mg | Freq: Three times a day (TID) | INTRAMUSCULAR | Status: DC | PRN

## 2014-09-03 MED ORDER — FLUOXETINE HCL 20 MG PO CAPS
40.0000 mg | ORAL_CAPSULE | Freq: Every day | ORAL | Status: DC
  Administered 2014-09-03 – 2014-09-05 (×3): 40 mg via ORAL
  Filled 2014-09-03 (×3): qty 2

## 2014-09-03 MED ORDER — ENOXAPARIN SODIUM 80 MG/0.8ML ~~LOC~~ SOLN
75.0000 mg | SUBCUTANEOUS | Status: AC
  Administered 2014-09-03: 75 mg via SUBCUTANEOUS
  Filled 2014-09-03: qty 0.8

## 2014-09-03 MED ORDER — IOHEXOL 300 MG/ML  SOLN
100.0000 mL | Freq: Once | INTRAMUSCULAR | Status: AC | PRN
  Administered 2014-09-03: 100 mL via INTRAVENOUS

## 2014-09-03 MED ORDER — SODIUM CHLORIDE 0.9 % IJ SOLN
3.0000 mL | Freq: Two times a day (BID) | INTRAMUSCULAR | Status: DC
  Administered 2014-09-03 – 2014-09-05 (×3): 3 mL via INTRAVENOUS

## 2014-09-03 MED ORDER — HEPARIN SODIUM (PORCINE) 5000 UNIT/ML IJ SOLN: 5000.0000 [IU] | Freq: Three times a day (TID) | INTRAMUSCULAR | Status: DC

## 2014-09-03 MED ORDER — FLUOXETINE HCL 40 MG PO CAPS: 40.0000 mg | ORAL_CAPSULE | Freq: Every day | ORAL | Status: DC

## 2014-09-03 MED ORDER — OXYCODONE HCL 5 MG PO TABS
5.0000 mg | ORAL_TABLET | Freq: Four times a day (QID) | ORAL | Status: DC | PRN
  Administered 2014-09-03 – 2014-09-05 (×7): 5 mg via ORAL
  Filled 2014-09-03 (×7): qty 1

## 2014-09-03 MED ORDER — ENOXAPARIN SODIUM 80 MG/0.8ML ~~LOC~~ SOLN
75.0000 mg | Freq: Two times a day (BID) | SUBCUTANEOUS | Status: DC
  Administered 2014-09-03 – 2014-09-04 (×2): 75 mg via SUBCUTANEOUS
  Filled 2014-09-03 (×2): qty 0.8

## 2014-09-03 MED ORDER — IOHEXOL 300 MG/ML  SOLN
25.0000 mL | INTRAMUSCULAR | Status: AC
  Administered 2014-09-03 (×2): 25 mL via ORAL

## 2014-09-03 MED ORDER — ALBUTEROL SULFATE (2.5 MG/3ML) 0.083% IN NEBU: 2.5000 mg | INHALATION_SOLUTION | RESPIRATORY_TRACT | Status: DC | PRN

## 2014-09-03 MED ORDER — PERFLUTREN LIPID MICROSPHERE
1.0000 mL | INTRAVENOUS | Status: AC | PRN
  Administered 2014-09-03: 3 mL via INTRAVENOUS
  Filled 2014-09-03: qty 10

## 2014-09-03 MED ORDER — INSULIN ASPART 100 UNIT/ML ~~LOC~~ SOLN
0.0000 [IU] | Freq: Three times a day (TID) | SUBCUTANEOUS | Status: DC
  Administered 2014-09-03: 1 [IU] via SUBCUTANEOUS
  Administered 2014-09-03: 2 [IU] via SUBCUTANEOUS
  Administered 2014-09-04: 1 [IU] via SUBCUTANEOUS
  Administered 2014-09-04: 3 [IU] via SUBCUTANEOUS
  Administered 2014-09-05: 2 [IU] via SUBCUTANEOUS
  Filled 2014-09-03: qty 1

## 2014-09-03 MED ORDER — INSULIN ASPART 100 UNIT/ML ~~LOC~~ SOLN
8.0000 [IU] | Freq: Three times a day (TID) | SUBCUTANEOUS | Status: DC
  Administered 2014-09-03 – 2014-09-05 (×6): 8 [IU] via SUBCUTANEOUS

## 2014-09-03 MED ORDER — SODIUM CHLORIDE 0.9 % IV SOLN
INTRAVENOUS | Status: DC
  Administered 2014-09-03 (×2): via INTRAVENOUS
  Administered 2014-09-04: 100 mL/h via INTRAVENOUS

## 2014-09-03 MED ORDER — ASPIRIN EC 81 MG PO TBEC
81.0000 mg | DELAYED_RELEASE_TABLET | Freq: Every day | ORAL | Status: DC
  Administered 2014-09-03 – 2014-09-05 (×3): 81 mg via ORAL
  Filled 2014-09-03 (×3): qty 1

## 2014-09-03 MED ORDER — SODIUM CHLORIDE 0.9 % IV BOLUS (SEPSIS)
500.0000 mL | Freq: Once | INTRAVENOUS | Status: AC
  Administered 2014-09-03: 500 mL via INTRAVENOUS

## 2014-09-03 MED ORDER — DICYCLOMINE HCL 20 MG PO TABS
20.0000 mg | ORAL_TABLET | Freq: Two times a day (BID) | ORAL | Status: DC
  Administered 2014-09-03 – 2014-09-05 (×5): 20 mg via ORAL
  Filled 2014-09-03 (×6): qty 1

## 2014-09-03 MED ORDER — INSULIN GLARGINE 100 UNIT/ML ~~LOC~~ SOLN
26.0000 [IU] | Freq: Every day | SUBCUTANEOUS | Status: DC
  Administered 2014-09-03 – 2014-09-04 (×3): 26 [IU] via SUBCUTANEOUS
  Filled 2014-09-03 (×3): qty 0.26

## 2014-09-03 MED ORDER — PANTOPRAZOLE SODIUM 40 MG PO TBEC
40.0000 mg | DELAYED_RELEASE_TABLET | Freq: Every day | ORAL | Status: DC
  Administered 2014-09-03 – 2014-09-05 (×3): 40 mg via ORAL
  Filled 2014-09-03 (×3): qty 1

## 2014-09-03 NOTE — Care Management Note (Signed)
Case Management Note  Patient Details  Name: Maureen [name] MRN: [id number] Date of Birth: 03-Oct-1970  Subjective/Objective:   Patient is from home, pta indep,  NCM awaiting benefit check for xarelto, pradaxia and eliquis.                   Action/Plan:   Expected Discharge Date:                  Expected Discharge Plan:  Home/Self Care  In-House Referral:     Discharge planning Services  CM Consult  Post Acute Care Choice:    Choice offered to:     DME Arranged:    DME Agency:     HH Arranged:    HH Agency:     Status of Service:  In process, will continue to follow  Medicare Important Message Given:    Date Medicare IM Given:    Medicare IM give by:    Date Additional Medicare IM Given:    Additional Medicare Important Message give by:     If discussed at [place] of Stay Meetings, dates discussed:    Additional Comments:  Zenon Mayo, RN 09/03/2014, 4:11 PM

## 2014-09-03 NOTE — Progress Notes (Signed)
[facility] PROGRESS NOTE  Maureen [name] MLY:[id number] DOB: 01-13-1971 DOA: 09/02/2014 PCP: [name], MD  Assessment/Plan:  Principal Problem:   Acute pulmonary embolism likely provoked. Just had trip to [place] by car. On lovenox. Ask CM to investigate coverage for NoAC Active Problems:   Transaminitis: acute hepatitis pending. [place] ok. Will get ct abd pelvis  Elevated lipase: check ct abd. S/p cholecystectomy   Nausea vomiting and diarrhea: improving   IDDM, uncontrolled. Adjusting insulin   Fibromyalgia   Diabetic retinopathy   Chest pain: describes substernal burning. Likely gerd. Add PPI   Major depression   GAD (generalized anxiety disorder)   PTSD (post-traumatic stress disorder)  HPI/Subjective: Some substernal burning. No n/v. Hungry.  Objective: Filed Vitals:   09/03/14 0545  BP: 118/63  Pulse: 66  Temp: 97.9 F (36.6 C)  Resp: 16    Intake/Output Summary (Last 24 hours) at 09/03/14 1049 Last data filed at 09/03/14 0900  Gross per 24 hour  Intake      0 ml  Output    850 ml  Net   -850 ml   Filed Weights   09/03/14 0153  Weight: 75.342 kg (166 lb 1.6 oz)    Exam:   General:  comfortable  Cardiovascular: RRR  Respiratory: CTA  Abdomen: S, NT, ND  Ext: CCE   Basic Metabolic Panel:  Recent Labs Lab 09/02/14 1547 09/02/14 1813 09/03/14 0513  NA 133* 134* 138  K 3.5 4.7 3.7  CL 91* 91* 102  CO2 20* 26 24  GLUCOSE 306* 362* 244*  BUN 9 6 7   CREATININE 1.14* 1.01* 0.81  CALCIUM 9.3 9.4 8.3*   Liver Function Tests:  Recent Labs Lab 09/02/14 1813 09/03/14 0513  AST 228* 105*  ALT 348* 222*  ALKPHOS 507* 343*  BILITOT 1.6* 0.7  PROT 8.1 5.8*  ALBUMIN 3.2* 2.4*    Recent Labs Lab 09/03/14 0513  LIPASE 61*    Recent Labs Lab 09/02/14 2010  AMMONIA 27   CBC:  Recent Labs Lab 09/02/14 1547 09/03/14 0513  WBC 12.8* 10.7*  HGB 14.1 12.0  HCT 40.7 35.7*  MCV 97.4 97.0  PLT 461* 395   Cardiac  Enzymes:  Recent Labs Lab 09/02/14 1813 09/03/14 0513 09/03/14 0858  TROPONINI <0.03 <0.03 <0.03   BNP (last 3 results)  Recent Labs  03/26/14 1531  BNP 27.9    ProBNP (last 3 results) No results for input(s): PROBNP in the last 8760 hours.  CBG:  Recent Labs Lab 09/02/14 1738 09/02/14 2241 09/03/14 0105 09/03/14 0811  GLUCAP 295* 473* 221* 197*    No results found for this or any previous visit (from the past 240 hour(s)).   Studies: Dg Chest 2 View  09/02/2014   CLINICAL DATA:  Intermittent chest pain for 2 days  EXAM: CHEST  2 VIEW  COMPARISON:  07/28/2014  FINDINGS: The heart size and mediastinal contours are within normal limits. Both lungs are clear. The visualized skeletal structures are unremarkable.  IMPRESSION: No active cardiopulmonary disease.   Electronically Signed   By: Lahoma Crocker M.D.   On: 09/02/2014 15:32   Ct Head Wo Contrast  09/02/2014   CLINICAL DATA:  Dizziness.  Urinary incontinence.  Chest pain.  EXAM: CT HEAD WITHOUT CONTRAST  TECHNIQUE: Contiguous axial images were obtained from the base of the skull through the vertex without intravenous contrast.  COMPARISON:  CT head without contrast 10/30/2013.  FINDINGS: No acute cortical infarct, hemorrhage, or mass lesion  is present. The ventricles are of normal size. No significant extra-axial fluid collection is evident. The paranasal sinuses and mastoid air cells are clear. The calvarium is intact. No significant extracranial soft tissue lesion is present. The globes and orbits are intact.  IMPRESSION: Negative CT of the head.   Electronically Signed   By: San Morelle M.D.   On: 09/02/2014 18:49   Ct Angio Chest Pe W/cm &/or Wo Cm  09/03/2014   CLINICAL DATA:  Chest pain and elevated D-dimer. Recent long distance travel  EXAM: CT ANGIOGRAPHY CHEST WITH CONTRAST  TECHNIQUE: Multidetector CT imaging of the chest was performed using the standard protocol during bolus administration of intravenous  contrast. Multiplanar CT image reconstructions and MIPs were obtained to evaluate the vascular anatomy.  CONTRAST:  169mL OMNIPAQUE IOHEXOL 350 MG/ML SOLN  COMPARISON:  None.  FINDINGS: THORACIC INLET/BODY WALL:  No acute abnormality.  MEDIASTINUM:  Single subsegmental filling defect at a branch point on series 406, image 108, most convincing on sagittal reconstruction.  Normal heart size. No pericardial effusion. No aortic aneurysm or dissection. No adenopathy.  LUNG WINDOWS:  No consolidation.  No effusion.  UPPER ABDOMEN:  No acute findings.  OSSEOUS:  No acute fracture.  No suspicious lytic or blastic lesions.  Critical Value/emergent results were called by telephone at the time of interpretation on 09/03/2014 at 4:01 am to [facility], who verbally acknowledged these results.  Review of the MIP images confirms the above findings.  IMPRESSION: Single subsegmental pulmonary embolism to the right upper lobe.   Electronically Signed   By: Monte Fantasia M.D.   On: 09/03/2014 04:04   Mr Brain Wo Contrast  09/02/2014   CLINICAL DATA:  Dizziness and lightheadedness 1 trying to walk. Symptoms began when she sits up and are worse with standing.  EXAM: MRI HEAD WITHOUT CONTRAST  TECHNIQUE: Multiplanar, multiecho pulse sequences of the brain and surrounding structures were obtained without intravenous contrast.  COMPARISON:  None.  FINDINGS: No acute infarct, hemorrhage, or mass lesion is present. The ventricles are of normal size. No significant extraaxial fluid collection is present.  Flow is present in the major intracranial arteries. No significant white matter disease is present. The globes and orbits are intact. The paranasal sinuses and mastoid air cells are clear.  Skullbase is within normal limits. Midline structures are unremarkable.  IMPRESSION: Negative MRI of the brain.   Electronically Signed   By: San Morelle M.D.   On: 09/02/2014 21:34   US Abdomen Limited Ruq  09/03/2014   CLINICAL DATA:   Right upper quadrant pain for 2 days with transaminitis  EXAM: US ABDOMEN LIMITED - RIGHT UPPER QUADRANT  COMPARISON:  Abdominal CT from 1 month ago  FINDINGS: Gallbladder:  Cholecystectomy.  No gallbladder fossa fluid collection.  Common bile duct:  Diameter: 4 mm.  Liver:  No focal lesion identified. Within normal limits in parenchymal echogenicity.  Fluid noted around the pancreatic head is ovoid and consistent with second portion duodenum. No definitive changes of pancreatitis.  IMPRESSION: Negative right upper quadrant ultrasound after cholecystectomy.   Electronically Signed   By: Monte Fantasia M.D.   On: 09/03/2014 00:18    Scheduled Meds: . aspirin EC  81 mg Oral Q breakfast  . atorvastatin  40 mg Oral q1800  . clonazePAM  2 mg Oral QHS  . enoxaparin (LOVENOX) injection  75 mg Subcutaneous Q12H  . FLUoxetine  40 mg Oral Daily  . insulin aspart  0-9 Units  Subcutaneous TID WC  . insulin glargine  26 Units Subcutaneous QHS  . sodium chloride  3 mL Intravenous Q12H   Continuous Infusions: . sodium chloride 100 mL/hr at 09/03/14 0404    Time spent: 35 minutes  [facility] [email or web address], password [facility] 09/03/2014, 10:49 AM  LOS: 1 day

## 2014-09-03 NOTE — Progress Notes (Signed)
Echocardiogram 2D Echocardiogram with Definity has been performed.  Maureen [name] 09/03/2014, 2:13 PM

## 2014-09-03 NOTE — Evaluation (Signed)
Physical Therapy Evaluation/Discharge Patient Details Name: Maureen [name] MRN: [id number] DOB: 1970-09-05 Today's Date: 09/03/2014   History of Present Illness  44 y.o. female admitted to [facility] on 09/02/14 for dizziness, N/V/D, abdominal pain, chest pain, and SOB.  Dx with PE and elevated liver enzymes.  Pt with significant PMHx of DM 1, fibromyalgia, depression, PTSD, major depressive disorder, closed head injury (2010), migraine, and L knee surgery.  Clinical Impression  Pt is independent with mobility and had no DOE with gait.  O2 sats 96-98% during gait on RA.  Encouraged pt to walk hallways while staying here.  RN made aware and O2 left off due to good sats.  PT to sign off.  Pt has no f/u PT needs or equipment needs at discharge.     Follow Up Recommendations No PT follow up    Equipment Recommendations  None recommended by PT    Recommendations for Other Services   NA    Precautions / Restrictions Precautions Precautions: None      Mobility  Bed Mobility Overal bed mobility: Independent                Transfers Overall transfer level: Independent                  Ambulation/Gait Ambulation/Gait assistance: Independent Ambulation Distance (Feet): 300 Feet Assistive device: None Gait Pattern/deviations: WFL(Within Functional Limits)   Gait velocity interpretation: at or above normal speed for age/gender    Stairs Stairs: Yes Stairs assistance: Modified independent (Device/Increase time) Stair Management: One rail Right;Alternating pattern;Forwards Number of Stairs: 8           Balance Overall balance assessment: No apparent balance deficits (not formally assessed)                                           Pertinent Vitals/Pain Pain Assessment: No/denies pain    Home Living Family/patient expects to be discharged to:: Private residence Living Arrangements: Children Available Help at Discharge: Family;Available  PRN/intermittently Type of Home: House Home Access: Stairs to enter Entrance Stairs-Rails: Right Entrance Stairs-Number of Steps: 15 Home Layout: One level Home Equipment: None      Prior Function Level of Independence: Independent                  Extremity/Trunk Assessment   Upper Extremity Assessment: Overall WFL for tasks assessed           Lower Extremity Assessment: Overall WFL for tasks assessed      Cervical / Trunk Assessment: Normal  Communication   Communication: No difficulties  Cognition Arousal/Alertness: Awake/alert Behavior During Therapy: WFL for tasks assessed/performed Overall Cognitive Status: Within Functional Limits for tasks assessed                               Assessment/Plan    PT Assessment Patent does not need any further PT services  PT Diagnosis Difficulty walking;Abnormality of gait;Generalized weakness         PT Goals (Current goals can be found in the Care Plan section) Acute Rehab PT Goals PT Goal Formulation: All assessment and education complete, DC therapy     End of Session   Activity Tolerance: Patient tolerated treatment well Patient left: Other (comment) (in bathroom) Nurse Communication: Mobility status (O2 sats WNL at rest  and during gait)         Time: 0092-3300 PT Time Calculation (min) (ACUTE ONLY): 19 min   Charges:   PT Evaluation $Initial PT Evaluation Tier I: 1 Procedure          Maureen [name] B. Pal[place], [place], DPT [phone]   09/03/2014, 12:44 PM

## 2014-09-03 NOTE — Consult Note (Signed)
Subjective:   HPI  The patient is a 44 year old female who presented to the emergency room with dizziness, nausea, vomiting, some loose stools, some abdominal pain, chest pain in the mid chest and shortness of breath. She was ultimately found to have a pulmonary embolus on CT angiogram.  We are asked to see her in regards to elevated liver enzymes. On admission her total bilirubin was 1.6, alkaline phosphatase 507, ALT 348, AST 228. Today her total bilirubin is 0.7, out: Phosphatase 343, ALT 222, AST 105. She denies alcohol. Rarely takes Tylenol. Acetaminophen level was checked and was not elevated. Denies intravenous drug use.  She is not having diarrhea today.  In 2013 the patient states her liver enzymes were elevated. She had a cholecystectomy, and pathology revealed a normal gallbladder and she did not have gallstones. She also had a liver biopsy which was unrevealing for any significant pathology. She states her liver enzymes eventually went back down to normal.    Review of Systems See above  Past Medical History  Diagnosis Date  . 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 Fibromyalgia   . Hyperlipidemia   . Abdominal pain   . Nausea & vomiting   . Biliary dyskinesia   . Depression   . Joint pain   . Skin rash     due to medications  . Post traumatic stress disorder (PTSD)   . Sleep trouble   . Major depressive disorder   . Cervical strain   . Closed head injury 2010  . Renal stones   . Migraine     hx of none recent  . Arthritis   . Hypoglycemia   . Anxiety    Past Surgical History  Procedure Laterality Date  . Lipoma removed  yrs ago  . Knee surgery Left 2012  . Cesarean section  1997, 1999  . Cholecystectomy  02/10/2011    Procedure: LAPAROSCOPIC CHOLECYSTECTOMY WITH INTRAOPERATIVE CHOLANGIOGRAM;  Surgeon: Judieth Keens, DO;  Location: [facility];  Service: General;  Laterality: N/A;  . Robotic assisted laparoscopic lysis of  adhesion N/A 03/04/2012    Procedure: ROBOTIC ASSISTED LAPAROSCOPIC LYSIS OF EXTENSIVE ADHESIONS;  Surgeon: Claiborne Billings A. Pamala Hurry, MD;  Location: [facility];  Service: Gynecology;  Laterality: N/A;  . Tonsillectomy  2001  . Abdominal hysterectomy  2001  . Eye surgery  2006    laser eye surgery left eye  . Exploratory laparomty  Mar 04, 2012  . Laparoscopic appendectomy N/A 10/12/2012    Procedure: DIAGNOSTIC APPENDECTOMY LAPAROSCOPIC;  Surgeon: Madilyn Hook, DO;  Location: [facility];  Service: General;  Laterality: N/A;  . Appendectomy     Social History   Social History  . Marital Status: Married    Spouse Name: Kimley Apsey  . Number of Children: 2  . Years of Education: 12+   Occupational History  . stay at home mom    Social History Main Topics  . Smoking status: Never Smoker   . Smokeless tobacco: Never Used  . Alcohol Use: No  . Drug Use: No  . Sexual Activity:    Partners: Male    Birth Control/ Protection: Surgical   Other Topics Concern  . Not on file   Social History Narrative   Lives with husband and two sons.  Before going back to school, she was a Research scientist (physical sciences) at [employer].  Stop working when she was diagnosed with endometriosis and had a TIA.   family history  includes Alcohol abuse in her father; Cancer in her father, maternal grandfather, and maternal grandmother; Cataracts in her mother; Depression in her mother; Diabetes in her brother; Drug abuse in her brother and father; Heart disease in her mother, paternal grandfather, and paternal grandmother; Hypertension in her mother; Mental illness in her maternal grandfather; Nephrolithiasis in her father; Suicidality in her maternal uncle; Vascular Disease in her father.  Current facility-administered medications:  .  0.9 %  sodium chloride infusion, , Intravenous, Continuous, Ivor Costa, MD, Last Rate: 100 mL/hr at 09/03/14 0404 .  albuterol (PROVENTIL) (2.5 MG/3ML) 0.083% nebulizer solution 2.5 mg, 2.5 mg,  Nebulization, Q4H PRN, Ivor Costa, MD .  aspirin EC tablet 81 mg, 81 mg, Oral, Q breakfast, Ivor Costa, MD, 81 mg at 09/03/14 0825 .  atorvastatin (LIPITOR) tablet 40 mg, 40 mg, Oral, q1800, Ivor Costa, MD .  clonazePAM [facility]) tablet 2 mg, 2 mg, Oral, QHS, Ivor Costa, MD, 2 mg at 09/03/14 0202 .  enoxaparin (LOVENOX) injection 75 mg, 75 mg, Subcutaneous, Q12H, Franky Macho, RPH .  FLUoxetine (PROZAC) capsule 40 mg, 40 mg, Oral, Daily, Ivor Costa, MD, 40 mg at 09/03/14 0826 .  insulin aspart (novoLOG) injection 0-9 Units, 0-9 Units, Subcutaneous, TID WC, Ivor Costa, MD, 2 Units at 09/03/14 0825 .  insulin glargine (LANTUS) injection 26 Units, 26 Units, Subcutaneous, QHS, Ivor Costa, MD, 26 Units at 09/03/14 0404 .  morphine 2 MG/ML injection 2 mg, 2 mg, Intravenous, Q4H PRN, Ivor Costa, MD, 2 mg at 09/03/14 0835 .  ondansetron (ZOFRAN) injection 4 mg, 4 mg, Intravenous, Q8H PRN, Ivor Costa, MD .  oxyCODONE-acetaminophen (PERCOCET/ROXICET) 5-325 MG per tablet 2 tablet, 2 tablet, Oral, Q6H PRN, Ivor Costa, MD .  sodium chloride 0.9 % injection 3 mL, 3 mL, Intravenous, Q12H, Ivor Costa, MD, 3 mL at 09/03/14 0408 Allergies  Allergen Reactions  . Cephalexin Rash    Pt was admitted to the hospital upon taking.  . Ibuprofen Nausea And Vomiting and Rash  . Meloxicam Nausea Only     Objective:     BP 118/63 mmHg  Pulse 66  Temp(Src) 97.9 F (36.6 C) (Oral)  Resp 16  Ht 5\' 4"  (1.626 m)  Wt 75.342 kg (166 lb 1.6 oz)  BMI 28.50 kg/m2  SpO2 98%  She is in no distress  Nonicteric  Heart regular rhythm no murmurs  Lungs clear  Abdomen: Bowel sounds normal, soft, nontender  Laboratory No components found for: D1    Assessment:     Elevated liver enzymes. Etiology unclear. This may simply be a transaminitis secondary to underlying acute systemic illness. LFTs have improved overnight.      Plan:     Follow clinically. Continue treatment for primary problem which at this time appears to  be a pulmonary embolus. Repeat LFTs. Check hepatitis profile. We will follow. Lab Results  Component Value Date   HGB 12.0 09/03/2014   HGB 14.1 09/02/2014   HGB 14.6 08/02/2014   HGB 15.7 12/18/2012   HGB 14.8 09/13/2012   HGB 13.5 08/14/2011   HGB 14.2 05/16/2008   HCT 35.7* 09/03/2014   HCT 40.7 09/02/2014   HCT 42.6 08/02/2014   HCT 50.3* 12/18/2012   HCT 46.0 09/13/2012   HCT 43.1 08/14/2011   HCT 42.4 05/16/2008   ALKPHOS 343* 09/03/2014   ALKPHOS 507* 09/02/2014   ALKPHOS 156* 08/02/2014   AST 105* 09/03/2014   AST 228* 09/02/2014   AST 21 08/02/2014   ALT  222* 09/03/2014   ALT 348* 09/02/2014   ALT 16 08/02/2014

## 2014-09-03 NOTE — Progress Notes (Signed)
Subjective: Patient feels her dizziness has improved and she now is walking without veering sided to side.  She states her head also feels "more clear". She still has chest discomfort and abdominal pain. She is aware she has a PE.   Objective: Current vital signs: BP 118/63 mmHg  Pulse 66  Temp(Src) 97.9 F (36.6 C) (Oral)  Resp 16  Ht 5\' 4"  (1.626 m)  Wt 75.342 kg (166 lb 1.6 oz)  BMI 28.50 kg/m2  SpO2 98% Vital signs in last 24 hours: Temp:  [97.9 F (36.6 C)-98.4 F (36.9 C)] 97.9 F (36.6 C) (08/15 0545) Pulse Rate:  [66-92] 66 (08/15 0545) Resp:  [14-22] 16 (08/15 0545) BP: (102-155)/(59-82) 118/63 mmHg (08/15 0545) SpO2:  [96 %-100 %] 98 % (08/15 0545) Weight:  [75.342 kg (166 lb 1.6 oz)] 75.342 kg (166 lb 1.6 oz) (08/15 0153)  Intake/Output from previous day: 08/14 0701 - 08/15 0700 In: -  Out: 450 [Urine:450] Intake/Output this shift:   Nutritional status: Diet NPO time specified Except for: Sips with Meds  Neurologic Exam: General: Mental Status: Alert, oriented, thought content appropriate.  Speech fluent without evidence of aphasia.  Able to follow 3 step commands without difficulty. Cranial Nerves: II: Visual fields grossly normal, pupils equal, round, reactive to light and accommodation III,IV, VI: ptosis not present, extra-ocular motions intact bilaterally V,VII: smile symmetric, facial light touch sensation normal bilaterally VIII: hearing normal bilaterally IX,X: uvula rises symmetrically XI: bilateral shoulder shrug XII: midline tongue extension without atrophy or fasciculations Motor: Right : Upper extremity   5/5    Left:     Upper extremity   5/5  Lower extremity   5/5     Lower extremity   5/5 Tone and bulk:normal tone throughout; no atrophy noted Sensory: Pinprick and light touch intact throughout, bilaterally Deep Tendon Reflexes:  Right: Upper Extremity   Left: Upper extremity   biceps (C-5 to C-6) 2/4   biceps (C-5 to C-6) 2/4 tricep (C7)  2/4    triceps (C7) 2/4 Brachioradialis (C6) 2/4  Brachioradialis (C6) 2/4  Lower Extremity Lower Extremity  quadriceps (L-2 to L-4) 2/4   quadriceps (L-2 to L-4) 2/4 Achilles (S1) 1/4   Achilles (S1) 1/4 Plantars: Right: downgoing   Left: downgoing Cerebellar: normal finger-to-nose,  normal heel-to-shin test    Lab Results: Basic Metabolic Panel:  Recent Labs Lab 09/02/14 1547 09/02/14 1813 09/03/14 0513  NA 133* 134* 138  K 3.5 4.7 3.7  CL 91* 91* 102  CO2 20* 26 24  GLUCOSE 306* 362* 244*  BUN 9 6 7   CREATININE 1.14* 1.01* 0.81  CALCIUM 9.3 9.4 8.3*    Liver Function Tests:  Recent Labs Lab 09/02/14 1813 09/03/14 0513  AST 228* 105*  ALT 348* 222*  ALKPHOS 507* 343*  BILITOT 1.6* 0.7  PROT 8.1 5.8*  ALBUMIN 3.2* 2.4*    Recent Labs Lab 09/03/14 0513  LIPASE 61*    Recent Labs Lab 09/02/14 2010  AMMONIA 27    CBC:  Recent Labs Lab 09/02/14 1547 09/03/14 0513  WBC 12.8* 10.7*  HGB 14.1 12.0  HCT 40.7 35.7*  MCV 97.4 97.0  PLT 461* 395    Cardiac Enzymes:  Recent Labs Lab 09/02/14 1813 09/03/14 0513  TROPONINI <0.03 <0.03    Lipid Panel:  Recent Labs Lab 09/03/14 0513  CHOL 235*  TRIG 113  HDL 62  CHOLHDL 3.8  VLDL 23  LDLCALC 150*    CBG:  Recent Labs Lab  09/02/14 1738 09/02/14 2241 09/03/14 0105 09/03/14 0811  GLUCAP 295* 473* 221* 197*    Microbiology: Results for orders placed or performed during the hospital encounter of 01/30/14  Urine culture     Status: None   Collection Time: 01/30/14  6:53 PM  Result Value Ref Range Status   Specimen Description URINE, CLEAN CATCH  Final   Special Requests NONE  Final   Colony Count   Final    30,000 COLONIES/ML Performed at [facility]    Culture   Final    Multiple bacterial morphotypes present, none predominant. Suggest appropriate recollection if clinically indicated. Performed at [facility]    Report Status 01/31/2014 FINAL  Final     Coagulation Studies:  Recent Labs  09/02/14 2010  LABPROT 13.7  INR 1.03    Imaging: Dg Chest 2 View  09/02/2014   CLINICAL DATA:  Intermittent chest pain for 2 days  EXAM: CHEST  2 VIEW  COMPARISON:  07/28/2014  FINDINGS: The heart size and mediastinal contours are within normal limits. Both lungs are clear. The visualized skeletal structures are unremarkable.  IMPRESSION: No active cardiopulmonary disease.   Electronically Signed   By: Lahoma Crocker M.D.   On: 09/02/2014 15:32   Ct Head Wo Contrast  09/02/2014   CLINICAL DATA:  Dizziness.  Urinary incontinence.  Chest pain.  EXAM: CT HEAD WITHOUT CONTRAST  TECHNIQUE: Contiguous axial images were obtained from the base of the skull through the vertex without intravenous contrast.  COMPARISON:  CT head without contrast 10/30/2013.  FINDINGS: No acute cortical infarct, hemorrhage, or mass lesion is present. The ventricles are of normal size. No significant extra-axial fluid collection is evident. The paranasal sinuses and mastoid air cells are clear. The calvarium is intact. No significant extracranial soft tissue lesion is present. The globes and orbits are intact.  IMPRESSION: Negative CT of the head.   Electronically Signed   By: San Morelle M.D.   On: 09/02/2014 18:49   Ct Angio Chest Pe W/cm &/or Wo Cm  09/03/2014   CLINICAL DATA:  Chest pain and elevated D-dimer. Recent long distance travel  EXAM: CT ANGIOGRAPHY CHEST WITH CONTRAST  TECHNIQUE: Multidetector CT imaging of the chest was performed using the standard protocol during bolus administration of intravenous contrast. Multiplanar CT image reconstructions and MIPs were obtained to evaluate the vascular anatomy.  CONTRAST:  132mL OMNIPAQUE IOHEXOL 350 MG/ML SOLN  COMPARISON:  None.  FINDINGS: THORACIC INLET/BODY WALL:  No acute abnormality.  MEDIASTINUM:  Single subsegmental filling defect at a branch point on series 406, image 108, most convincing on sagittal reconstruction.   Normal heart size. No pericardial effusion. No aortic aneurysm or dissection. No adenopathy.  LUNG WINDOWS:  No consolidation.  No effusion.  UPPER ABDOMEN:  No acute findings.  OSSEOUS:  No acute fracture.  No suspicious lytic or blastic lesions.  Critical Value/emergent results were called by telephone at the time of interpretation on 09/03/2014 at 4:01 am to [facility], who verbally acknowledged these results.  Review of the MIP images confirms the above findings.  IMPRESSION: Single subsegmental pulmonary embolism to the right upper lobe.   Electronically Signed   By: Monte Fantasia M.D.   On: 09/03/2014 04:04   Mr Brain Wo Contrast  09/02/2014   CLINICAL DATA:  Dizziness and lightheadedness 1 trying to walk. Symptoms began when she sits up and are worse with standing.  EXAM: MRI HEAD WITHOUT CONTRAST  TECHNIQUE: Multiplanar, multiecho  pulse sequences of the brain and surrounding structures were obtained without intravenous contrast.  COMPARISON:  None.  FINDINGS: No acute infarct, hemorrhage, or mass lesion is present. The ventricles are of normal size. No significant extraaxial fluid collection is present.  Flow is present in the major intracranial arteries. No significant white matter disease is present. The globes and orbits are intact. The paranasal sinuses and mastoid air cells are clear.  Skullbase is within normal limits. Midline structures are unremarkable.  IMPRESSION: Negative MRI of the brain.   Electronically Signed   By: San Morelle M.D.   On: 09/02/2014 21:34   US Abdomen Limited Ruq  09/03/2014   CLINICAL DATA:  Right upper quadrant pain for 2 days with transaminitis  EXAM: US ABDOMEN LIMITED - RIGHT UPPER QUADRANT  COMPARISON:  Abdominal CT from 1 month ago  FINDINGS: Gallbladder:  Cholecystectomy.  No gallbladder fossa fluid collection.  Common bile duct:  Diameter: 4 mm.  Liver:  No focal lesion identified. Within normal limits in parenchymal echogenicity.  Fluid noted around  the pancreatic head is ovoid and consistent with second portion duodenum. No definitive changes of pancreatitis.  IMPRESSION: Negative right upper quadrant ultrasound after cholecystectomy.   Electronically Signed   By: Monte Fantasia M.D.   On: 09/03/2014 00:18    Medications:  Scheduled: . aspirin EC  81 mg Oral Q breakfast  . atorvastatin  40 mg Oral q1800  . clonazePAM  2 mg Oral QHS  . enoxaparin (LOVENOX) injection  75 mg Subcutaneous Q12H  . FLUoxetine  40 mg Oral Daily  . insulin aspart  0-9 Units Subcutaneous TID WC  . insulin glargine  26 Units Subcutaneous QHS  . sodium chloride  3 mL Intravenous Q12H   Etta Quill PA-C [facility] [phone]  09/03/2014, 9:03 AM  Patient seen and examined.  Clinical course and management discussed.  Necessary edits performed.  I agree with the above.  Assessment and plan of care developed and discussed below.    Assessment/Plan: Dizziness improved.  MRI of the brain independently reviewed.  MRI shows no acute changes.  Doubt dizziness from CNS origin.    Recommendations: 1.  No further neurologic intervention is recommended at this time.  If further questions arise, please call or page at that time.  Thank you for allowing neurology to participate in the care of this patient.    Alexis Goodell, MD [facility] [phone]  09/03/2014  9:51 AM

## 2014-09-03 NOTE — Progress Notes (Signed)
ANTICOAGULATION CONSULT NOTE - Initial Consult  Pharmacy Consult for Lovenox Indication: pulmonary embolus  Allergies  Allergen Reactions  . Cephalexin Rash    Pt was admitted to the hospital upon taking.  . Ibuprofen Nausea And Vomiting and Rash  . Meloxicam Nausea Only    Patient Measurements: Height: 5\' 4"  (162.6 cm) Weight: 166 lb 1.6 oz (75.342 kg) IBW/kg (Calculated) : 54.7  Vital Signs: Temp: 98.4 F (36.9 C) (08/15 0144) Temp Source: Oral (08/15 0144) BP: 139/73 mmHg (08/15 0144) Pulse Rate: 72 (08/15 0144)  Labs:  Recent Labs  09/02/14 1547 09/02/14 1813 09/02/14 2010  HGB 14.1  --   --   HCT 40.7  --   --   PLT 461*  --   --   LABPROT  --   --  13.7  INR  --   --  1.03  CREATININE 1.14* 1.01*  --   TROPONINI  --  <0.03  --     Estimated Creatinine Clearance: 72.8 mL/min (by C-G formula based on Cr of 1.01).   Medical History: Past Medical History  Diagnosis Date  . 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 Fibromyalgia   . Hyperlipidemia   . Abdominal pain   . Nausea & vomiting   . Biliary dyskinesia   . Depression   . Joint pain   . Skin rash     due to medications  . Post traumatic stress disorder (PTSD)   . Sleep trouble   . Major depressive disorder   . Cervical strain   . Closed head injury 2010  . Renal stones   . Migraine     hx of none recent  . Arthritis   . Hypoglycemia   . Anxiety     Medications:  Prescriptions prior to admission  Medication Sig Dispense Refill Last Dose  . aspirin EC 81 MG tablet Take 1 tablet (81 mg total) by mouth daily with breakfast.   09/01/2014 at Unknown time  . atorvastatin (LIPITOR) 40 MG tablet Take 1 tablet (40 mg total) by mouth daily. Resume home meds for high cholesterol 90 tablet 0 09/01/2014 at Unknown time  . clonazePAM (KLONOPIN) 2 MG tablet Take 1 tablet (2 mg total) by mouth at bedtime. (Patient taking differently: Take 2 mg by mouth at bedtime as needed  for anxiety. ) 30 tablet 2 09/01/2014 at Unknown time  . FLUoxetine (PROZAC) 40 MG capsule Take 1 capsule (40 mg total) by mouth daily. For depression 30 capsule 5 09/01/2014 at Unknown time  . HUMALOG KWIKPEN 100 UNIT/ML KiwkPen Inject 8-10 Units into the skin 3 (three) times daily.  6 09/02/2014 at Unknown time  . insulin glargine (LANTUS) 100 UNIT/ML injection Inject 26 Units into the skin at bedtime.    09/01/2014 at Unknown time  . dicyclomine (BENTYL) 20 MG tablet Take 1 tablet (20 mg total) by mouth 2 (two) times daily. 20 tablet 0   . insulin aspart (NOVOLOG) 100 UNIT/ML injection Inject 8 Units into the skin 3 (three) times daily with meals. 10 mL 11 08/01/2014 at Unknown time  . loperamide (IMODIUM) 2 MG capsule Take 1 capsule (2 mg total) by mouth 4 (four) times daily as needed for diarrhea or loose stools. 12 capsule 0   . oxyCODONE-acetaminophen (PERCOCET/ROXICET) 5-325 MG per tablet 1 to 2 tabs PO q6hrs  PRN for pain 15 tablet 0     Assessment: 44 y.o. female presents with dizziness, CP, SOB,  N/V, abd pain. Found to have acute PE on chest CT. MRI negative for bleed/stroke. To begin Lovenox for PE. Estimated CrCl 70 ml/min. CBC stable at baseline.  Goal of Therapy:  Anti-Xa level 0.6-1 units/ml 4hrs after LMWH dose given Monitor platelets by anticoagulation protocol: Yes   Plan:  Lovenox 75mg  SQ q12h CBC q72h while pt on Lovenox   Sherlon Handing, PharmD, BCPS Clinical pharmacist, pager [phone] 09/03/2014,4:07 AM

## 2014-09-04 ENCOUNTER — Ambulatory Visit (HOSPITAL_COMMUNITY): Payer: BLUE CROSS/BLUE SHIELD

## 2014-09-04 DIAGNOSIS — R7989 Other specified abnormal findings of blood chemistry: Secondary | ICD-10-CM

## 2014-09-04 DIAGNOSIS — I2699 Other pulmonary embolism without acute cor pulmonale: Principal | ICD-10-CM

## 2014-09-04 DIAGNOSIS — R740 Nonspecific elevation of levels of transaminase and lactic acid dehydrogenase [LDH]: Secondary | ICD-10-CM

## 2014-09-04 DIAGNOSIS — R0602 Shortness of breath: Secondary | ICD-10-CM

## 2014-09-04 DIAGNOSIS — R112 Nausea with vomiting, unspecified: Secondary | ICD-10-CM

## 2014-09-04 DIAGNOSIS — R197 Diarrhea, unspecified: Secondary | ICD-10-CM

## 2014-09-04 LAB — CARDIOLIPIN ANTIBODIES, IGG, IGM, IGA
Anticardiolipin IgA: <9 APL U/mL (ref 0–11)
Anticardiolipin IgG: <9 GPL U/mL (ref 0–14)
Anticardiolipin IgM: <9 MPL U/mL (ref 0–12)

## 2014-09-04 LAB — COMPREHENSIVE METABOLIC PANEL
ALT: 234 U/L — ABNORMAL HIGH (ref 14–54)
AST: 252 U/L — ABNORMAL HIGH (ref 15–41)
Albumin: 2.6 g/dL — ABNORMAL LOW (ref 3.5–5.0)
Alkaline Phosphatase: 378 U/L — ABNORMAL HIGH (ref 38–126)
Anion gap: 10 (ref 5–15)
BUN: <5 mg/dL — ABNORMAL LOW (ref 6–20)
CO2: 25 mmol/L (ref 22–32)
Calcium: 8.7 mg/dL — ABNORMAL LOW (ref 8.9–10.3)
Chloride: 104 mmol/L (ref 101–111)
Creatinine, Ser: 0.8 mg/dL (ref 0.44–1.00)
GFR calc Af Amer: >60 mL/min (ref >60)
GFR calc non Af Amer: >60 mL/min (ref >60)
Glucose, Bld: 244 mg/dL — ABNORMAL HIGH (ref 65–99)
Potassium: 4.1 mmol/L (ref 3.5–5.1)
Sodium: 139 mmol/L (ref 135–145)
Total Bilirubin: 0.5 mg/dL (ref 0.3–1.2)
Total Protein: 6.7 g/dL (ref 6.5–8.1)

## 2014-09-04 LAB — GLUCOSE, CAPILLARY
Glucose-Capillary: 245 mg/dL — ABNORMAL HIGH (ref 65–99)
Glucose-Capillary: 146 mg/dL — ABNORMAL HIGH (ref 65–99)
Glucose-Capillary: 35 mg/dL — CL (ref 65–99)
Glucose-Capillary: 43 mg/dL — CL (ref 65–99)
Glucose-Capillary: 73 mg/dL (ref 65–99)
Glucose-Capillary: 258 mg/dL — ABNORMAL HIGH (ref 65–99)

## 2014-09-04 LAB — BETA-2-GLYCOPROTEIN I ABS, IGG/M/A
Beta-2 Glyco I IgG: <9 GPI IgG units (ref 0–20)
Beta-2-Glycoprotein I IgA: <9 GPI IgA units (ref 0–25)
Beta-2-Glycoprotein I IgM: <9 GPI IgM units (ref 0–32)

## 2014-09-04 LAB — HOMOCYSTEINE: Homocysteine: 6.3 umol/L (ref 0.0–15.0)

## 2014-09-04 MED ORDER — RIVAROXABAN 15 MG PO TABS
15.0000 mg | ORAL_TABLET | Freq: Two times a day (BID) | ORAL | Status: DC
Start: 2014-09-04 — End: 2014-09-05
  Administered 2014-09-04 – 2014-09-05 (×2): 15 mg via ORAL
  Filled 2014-09-04 (×2): qty 1

## 2014-09-04 MED ORDER — RIVAROXABAN 20 MG PO TABS: 20.0000 mg | ORAL_TABLET | Freq: Every day | ORAL | Status: DC

## 2014-09-04 MED ORDER — GLUCOSE 40 % PO GEL
ORAL | Status: AC
  Filled 2014-09-04: qty 1

## 2014-09-04 NOTE — Progress Notes (Signed)
Hypoglycemic Event  CBG: 35  Treatment: 15 GM carbohydrate snack  Symptoms: Pale and Sweaty  Follow-up CBG: Time:1610 CBG Result:43  Followup CBG: 1700 CBG was 73  Possible Reasons for Event: Unknown  Comments/MD notified:    Kariann Wecker Margaretha Sheffield  Remember to initiate Hypoglycemia Order Set & complete

## 2014-09-04 NOTE — Progress Notes (Signed)
ANTICOAGULATION CONSULT NOTE - Follow Up Consult  Pharmacy Consult for Xarelto Indication: pulmonary embolus  Allergies  Allergen Reactions  . Cephalexin Rash    Pt was admitted to the hospital upon taking.  . Ibuprofen Nausea And Vomiting and Rash  . Meloxicam Nausea Only    Patient Measurements: Height: 5\' 4"  (162.6 cm) Weight: 166 lb 1.6 oz (75.342 kg) IBW/kg (Calculated) : 54.7   Vital Signs: Temp: 97.8 F (36.6 C) (08/16 0616) Temp Source: Oral (08/16 0616) BP: 145/73 mmHg (08/16 0616) Pulse Rate: 65 (08/16 0616)  Labs:  Recent Labs  09/02/14 1547 09/02/14 1813 09/02/14 2010 09/03/14 0513 09/03/14 0858  HGB 14.1  --   --  12.0  --   HCT 40.7  --   --  35.7*  --   PLT 461*  --   --  395  --   APTT  --   --   --  33  --   LABPROT  --   --  13.7  --   --   INR  --   --  1.03  --   --   CREATININE 1.14* 1.01*  --  0.81  --   TROPONINI  --  <0.03  --  <0.03 <0.03    Estimated Creatinine Clearance: 90.8 mL/min (by C-G formula based on Cr of 0.81).   Medications:  Scheduled:  . aspirin EC  81 mg Oral Q breakfast  . clonazePAM  2 mg Oral QHS  . dicyclomine  20 mg Oral BID  . enoxaparin (LOVENOX) injection  75 mg Subcutaneous Q12H  . FLUoxetine  40 mg Oral Daily  . insulin aspart  0-9 Units Subcutaneous TID WC  . insulin aspart  8 Units Subcutaneous TID WC  . insulin glargine  26 Units Subcutaneous QHS  . pantoprazole  40 mg Oral Daily  . sodium chloride  3 mL Intravenous Q12H    Assessment: 44 y.o. female presents with dizziness, CP, SOB, N/V, abd pain. Found to have acute PE on chest CT. MRI negative for bleed/stroke. Patient has been receiving Lovenox for PE. Estimated CrCl 70 ml/min. CBC stable at baseline.  Pharmacy now consulted to transition to Xarelto for PE  Goal of Therapy:  Monitor platelets by anticoagulation protocol: Yes   Plan:  -Discontinue Lovenox - Xarelto 15mg  twice daily x 21 days -- first dose due today at 3pm - Xarelto 20 mg  daily to begin 9/7 -Monitor for signs/symptoms bleeding  Reatha Harps, Pharm.D., BCPS Clinical Pharmacist [phone] Pager 09/04/2014 12:16 PM

## 2014-09-04 NOTE — Care Management Note (Addendum)
Case Management Note  Patient Details  Name: Maureen [name] MRN: [id number] Date of Birth: 1970-02-22  Subjective/Objective:   Patient is from home, her two sons live with her, pta indep.  NCM informed patient that she will have a $30 co pay with either xarelto, pradaxia, or eliquis.  Once NCM finds out which one MD will put her on , I will give a 30 day savings card. NCM will cont to follow for dc needs.  Per pharmacy not patient will be on xarelto,  NCM gave patient free trial 30 day savings card for xarelto and also the 0 copay for 12 months savings card.               Action/Plan:   Expected Discharge Date:                  Expected Discharge Plan:  Home/Self Care  In-House Referral:     Discharge planning Services  CM Consult  Post Acute Care Choice:    Choice offered to:     DME Arranged:    DME Agency:     HH Arranged:    [place] Agency:     Status of Service:  Completed, signed off  Medicare Important Message Given:    Date Medicare IM Given:    Medicare IM give by:    Date Additional Medicare IM Given:    Additional Medicare Important Message give by:     If discussed at [place] of Stay Meetings, dates discussed:    Additional Comments:  Zenon Mayo, RN 09/04/2014, 11:58 AM

## 2014-09-04 NOTE — Progress Notes (Signed)
[facility] PROGRESS NOTE  EASTYN SKALLA XTA:[id number] DOB: 1970-01-21 DOA: 09/02/2014 PCP: [name], MD  Assessment/Plan:    Acute pulmonary embolism likely provoked. Just had trip to [place] by car. On lovenox change to xarelto -duplex negative    Transaminitis: neg hepatitis panel, treding down  Elevated lipase:  S/p cholecystectomy   Nausea vomiting and diarrhea: improving   IDDM, uncontrolled. Adjusting insulin   Fibromyalgia   Diabetic retinopathy   Chest pain: describes substernal burning. Likely gerd. Add PPI   Major depression   GAD (generalized anxiety disorder)   PTSD (post-traumatic stress disorder)  Plan to d/c in AM   HPI/Subjective: Eating- tolerating diet No SOB  Objective: Filed Vitals:   09/04/14 0616  BP: 145/73  Pulse: 65  Temp: 97.8 F (36.6 C)  Resp: 18    Intake/Output Summary (Last 24 hours) at 09/04/14 1154 Last data filed at 09/04/14 0900  Gross per 24 hour  Intake 3413.33 ml  Output    500 ml  Net 2913.33 ml   Filed Weights   09/03/14 0153  Weight: 75.342 kg (166 lb 1.6 oz)    Exam:   General:  comfortable  Cardiovascular: RRR  Respiratory: CTA  Abdomen: S, NT, ND  Ext: CCE   Basic Metabolic Panel:  Recent Labs Lab 09/02/14 1547 09/02/14 1813 09/03/14 0513  NA 133* 134* 138  K 3.5 4.7 3.7  CL 91* 91* 102  CO2 20* 26 24  GLUCOSE 306* 362* 244*  BUN 9 6 7   CREATININE 1.14* 1.01* 0.81  CALCIUM 9.3 9.4 8.3*   Liver Function Tests:  Recent Labs Lab 09/02/14 1813 09/03/14 0513  AST 228* 105*  ALT 348* 222*  ALKPHOS 507* 343*  BILITOT 1.6* 0.7  PROT 8.1 5.8*  ALBUMIN 3.2* 2.4*    Recent Labs Lab 09/03/14 0513  LIPASE 61*    Recent Labs Lab 09/02/14 2010  AMMONIA 27   CBC:  Recent Labs Lab 09/02/14 1547 09/03/14 0513  WBC 12.8* 10.7*  HGB 14.1 12.0  HCT 40.7 35.7*  MCV 97.4 97.0  PLT 461* 395   Cardiac Enzymes:  Recent Labs Lab 09/02/14 1813 09/03/14 0513  09/03/14 0858  TROPONINI <0.03 <0.03 <0.03   BNP (last 3 results)  Recent Labs  03/26/14 1531  BNP 27.9    ProBNP (last 3 results) No results for input(s): PROBNP in the last 8760 hours.  CBG:  Recent Labs Lab 09/03/14 0811 09/03/14 1144 09/03/14 1616 09/03/14 2218 09/04/14 0830  GLUCAP 197* 132* 116* 88 245*    No results found for this or any previous visit (from the past 240 hour(s)).   Studies: Dg Chest 2 View  09/02/2014   CLINICAL DATA:  Intermittent chest pain for 2 days  EXAM: CHEST  2 VIEW  COMPARISON:  07/28/2014  FINDINGS: The heart size and mediastinal contours are within normal limits. Both lungs are clear. The visualized skeletal structures are unremarkable.  IMPRESSION: No active cardiopulmonary disease.   Electronically Signed   By: Lahoma Crocker M.D.   On: 09/02/2014 15:32   Ct Head Wo Contrast  09/02/2014   CLINICAL DATA:  Dizziness.  Urinary incontinence.  Chest pain.  EXAM: CT HEAD WITHOUT CONTRAST  TECHNIQUE: Contiguous axial images were obtained from the base of the skull through the vertex without intravenous contrast.  COMPARISON:  CT head without contrast 10/30/2013.  FINDINGS: No acute cortical infarct, hemorrhage, or mass lesion is present. The ventricles are of normal size. No significant  extra-axial fluid collection is evident. The paranasal sinuses and mastoid air cells are clear. The calvarium is intact. No significant extracranial soft tissue lesion is present. The globes and orbits are intact.  IMPRESSION: Negative CT of the head.   Electronically Signed   By: San Morelle M.D.   On: 09/02/2014 18:49   Ct Angio Chest Pe W/cm &/or Wo Cm  09/03/2014   CLINICAL DATA:  Chest pain and elevated D-dimer. Recent long distance travel  EXAM: CT ANGIOGRAPHY CHEST WITH CONTRAST  TECHNIQUE: Multidetector CT imaging of the chest was performed using the standard protocol during bolus administration of intravenous contrast. Multiplanar CT image  reconstructions and MIPs were obtained to evaluate the vascular anatomy.  CONTRAST:  181mL OMNIPAQUE IOHEXOL 350 MG/ML SOLN  COMPARISON:  None.  FINDINGS: THORACIC INLET/BODY WALL:  No acute abnormality.  MEDIASTINUM:  Single subsegmental filling defect at a branch point on series 406, image 108, most convincing on sagittal reconstruction.  Normal heart size. No pericardial effusion. No aortic aneurysm or dissection. No adenopathy.  LUNG WINDOWS:  No consolidation.  No effusion.  UPPER ABDOMEN:  No acute findings.  OSSEOUS:  No acute fracture.  No suspicious lytic or blastic lesions.  Critical Value/emergent results were called by telephone at the time of interpretation on 09/03/2014 at 4:01 am to [facility], who verbally acknowledged these results.  Review of the MIP images confirms the above findings.  IMPRESSION: Single subsegmental pulmonary embolism to the right upper lobe.   Electronically Signed   By: Monte Fantasia M.D.   On: 09/03/2014 04:04   Mr Brain Wo Contrast  09/02/2014   CLINICAL DATA:  Dizziness and lightheadedness 1 trying to walk. Symptoms began when she sits up and are worse with standing.  EXAM: MRI HEAD WITHOUT CONTRAST  TECHNIQUE: Multiplanar, multiecho pulse sequences of the brain and surrounding structures were obtained without intravenous contrast.  COMPARISON:  None.  FINDINGS: No acute infarct, hemorrhage, or mass lesion is present. The ventricles are of normal size. No significant extraaxial fluid collection is present.  Flow is present in the major intracranial arteries. No significant white matter disease is present. The globes and orbits are intact. The paranasal sinuses and mastoid air cells are clear.  Skullbase is within normal limits. Midline structures are unremarkable.  IMPRESSION: Negative MRI of the brain.   Electronically Signed   By: San Morelle M.D.   On: 09/02/2014 21:34   Ct Abdomen Pelvis W Contrast  09/03/2014   CLINICAL DATA:  Nausea/vomiting, diarrhea,  right lower abdominal pain. Prior cholecystectomy, appendectomy, and hysterectomy.  EXAM: CT ABDOMEN AND PELVIS WITH CONTRAST  TECHNIQUE: Multidetector CT imaging of the abdomen and pelvis was performed using the standard protocol following bolus administration of intravenous contrast.  CONTRAST:  183mL OMNIPAQUE IOHEXOL 300 MG/ML  SOLN  COMPARISON:  None.  FINDINGS: Lower chest:  Trace bilateral pleural effusions.  Hepatobiliary: Liver is within normal limits. No suspicious/enhancing hepatic lesions.  Status post cholecystectomy. No intrahepatic or extrahepatic ductal dilatation.  Pancreas: Within normal limits.  Spleen: Within normal limits.  Adrenals/Urinary Tract: Adrenal glands are within normal limits.  Kidneys are within normal limits.  No hydronephrosis.  Bladder is mildly thick-walled decreased although underdistended. Excretory contrast from recent CTA chest.  Stomach/Bowel: Stomach is within normal limits.  Visualized bowel is unremarkable.  Prior appendectomy.  Vascular/Lymphatic: No evidence of abdominal aortic aneurysm.  No suspicious abdominopelvic lymphadenopathy.  Reproductive: Suspected prior supracervical hysterectomy.  No adnexal masses.  Other: Trace  pelvic ascites.  Musculoskeletal: Visualized osseous structures are within normal limits.  IMPRESSION: No evidence of bowel obstruction.  Status post cholecystectomy, appendectomy, and hysterectomy.  No CT findings to account for the patient's lower abdominal pain.   Electronically Signed   By: Julian Hy M.D.   On: 09/03/2014 18:14   US Abdomen Limited Ruq  09/03/2014   CLINICAL DATA:  Right upper quadrant pain for 2 days with transaminitis  EXAM: US ABDOMEN LIMITED - RIGHT UPPER QUADRANT  COMPARISON:  Abdominal CT from 1 month ago  FINDINGS: Gallbladder:  Cholecystectomy.  No gallbladder fossa fluid collection.  Common bile duct:  Diameter: 4 mm.  Liver:  No focal lesion identified. Within normal limits in parenchymal echogenicity.   Fluid noted around the pancreatic head is ovoid and consistent with second portion duodenum. No definitive changes of pancreatitis.  IMPRESSION: Negative right upper quadrant ultrasound after cholecystectomy.   Electronically Signed   By: Monte Fantasia M.D.   On: 09/03/2014 00:18    Scheduled Meds: . aspirin EC  81 mg Oral Q breakfast  . clonazePAM  2 mg Oral QHS  . dicyclomine  20 mg Oral BID  . enoxaparin (LOVENOX) injection  75 mg Subcutaneous Q12H  . FLUoxetine  40 mg Oral Daily  . insulin aspart  0-9 Units Subcutaneous TID WC  . insulin aspart  8 Units Subcutaneous TID WC  . insulin glargine  26 Units Subcutaneous QHS  . pantoprazole  40 mg Oral Daily  . sodium chloride  3 mL Intravenous Q12H   Continuous Infusions: . sodium chloride 100 mL/hr (09/04/14 0454)    Time spent: 35 minutes  VANN, JESSICA  [facility] [email or web address], password [facility] 09/04/2014, 11:54 AM  LOS: 2 days

## 2014-09-04 NOTE — Discharge Instructions (Signed)
Information on my medicine - XARELTO (rivaroxaban)  This medication education was reviewed with me or my healthcare representative as part of my discharge preparation.    WHY WAS XARELTO PRESCRIBED FOR YOU? Xarelto was prescribed to treat blood clots that may have been found in the veins of your legs (deep vein thrombosis) or in your lungs (pulmonary embolism) and to reduce the risk of them occurring again.  What do you need to know about Xarelto? The starting dose is one 15 mg tablet taken TWICE daily with food for the FIRST 21 DAYS then on (enter date) 09/26/14 the dose is changed to one 20 mg tablet taken ONCE A DAY with your evening meal.  DO NOT stop taking Xarelto without talking to the health care provider who prescribed the medication.  Refill your prescription for 20 mg tablets before you run out.  After discharge, you should have regular check-up appointments with your healthcare provider that is prescribing your Xarelto.  In the future your dose may need to be changed if your kidney function changes by a significant amount.  What do you do if you miss a dose? If you are taking Xarelto TWICE DAILY and you miss a dose, take it as soon as you remember. You may take two 15 mg tablets (total 30 mg) at the same time then resume your regularly scheduled 15 mg twice daily the next day.  If you are taking Xarelto ONCE DAILY and you miss a dose, take it as soon as you remember on the same day then continue your regularly scheduled once daily regimen the next day. Do not take two doses of Xarelto at the same time.   Important Safety Information Xarelto is a blood thinner medicine that can cause bleeding. You should call your healthcare provider right away if you experience any of the following: ? Bleeding from an injury or your nose that does not stop. ? Unusual colored urine (red or dark brown) or unusual colored stools (red or black). ? Unusual bruising for unknown reasons. ? A  serious fall or if you hit your head (even if there is no bleeding).  Some medicines may interact with Xarelto and might increase your risk of bleeding while on Xarelto. To help avoid this, consult your healthcare provider or pharmacist prior to using any new prescription or non-prescription medications, including herbals, vitamins, non-steroidal anti-inflammatory drugs (NSAIDs) and supplements.  This website has more information on Xarelto: [email or web address]

## 2014-09-04 NOTE — Progress Notes (Signed)
[facility] Progress Note  Subjective: No complaints today.  Objective: Vital signs in last 24 hours: Temp:  [97.8 F (36.6 C)-98.8 F (37.1 C)] 97.8 F (36.6 C) (08/16 0616) Pulse Rate:  [65-73] 65 (08/16 0616) Resp:  [18] 18 (08/16 0616) BP: (132-145)/(65-73) 145/73 mmHg (08/16 0616) SpO2:  [97 %-100 %] 97 % (08/16 0616) Weight change:    PE:  Heart regular rhythm  Lungs clear  Abdomen soft and nontender  Lab Results: Results for orders placed or performed during the hospital encounter of 09/02/14 (from the past 24 hour(s))  Glucose, capillary     Status: Abnormal   Collection Time: 09/03/14 11:44 AM  Result Value Ref Range   Glucose-Capillary 132 (H) 65 - 99 mg/dL   Comment 1 Notify RN   Glucose, capillary     Status: Abnormal   Collection Time: 09/03/14  4:16 PM  Result Value Ref Range   Glucose-Capillary 116 (H) 65 - 99 mg/dL   Comment 1 Notify RN    Comment 2 Call MD NNP PA CNM   Glucose, capillary     Status: None   Collection Time: 09/03/14 10:18 PM  Result Value Ref Range   Glucose-Capillary 88 65 - 99 mg/dL   Comment 1 Notify RN    Comment 2 Document in Chart   Glucose, capillary     Status: Abnormal   Collection Time: 09/04/14  8:30 AM  Result Value Ref Range   Glucose-Capillary 245 (H) 65 - 99 mg/dL    Studies/Results: Ct Abdomen Pelvis W Contrast  09/03/2014   CLINICAL DATA:  Nausea/vomiting, diarrhea, right lower abdominal pain. Prior cholecystectomy, appendectomy, and hysterectomy.  EXAM: CT ABDOMEN AND PELVIS WITH CONTRAST  TECHNIQUE: Multidetector CT imaging of the abdomen and pelvis was performed using the standard protocol following bolus administration of intravenous contrast.  CONTRAST:  114mL OMNIPAQUE IOHEXOL 300 MG/ML  SOLN  COMPARISON:  None.  FINDINGS: Lower chest:  Trace bilateral pleural effusions.  Hepatobiliary: Liver is within normal limits. No suspicious/enhancing hepatic lesions.  Status post cholecystectomy. No  intrahepatic or extrahepatic ductal dilatation.  Pancreas: Within normal limits.  Spleen: Within normal limits.  Adrenals/Urinary Tract: Adrenal glands are within normal limits.  Kidneys are within normal limits.  No hydronephrosis.  Bladder is mildly thick-walled decreased although underdistended. Excretory contrast from recent CTA chest.  Stomach/Bowel: Stomach is within normal limits.  Visualized bowel is unremarkable.  Prior appendectomy.  Vascular/Lymphatic: No evidence of abdominal aortic aneurysm.  No suspicious abdominopelvic lymphadenopathy.  Reproductive: Suspected prior supracervical hysterectomy.  No adnexal masses.  Other: Trace pelvic ascites.  Musculoskeletal: Visualized osseous structures are within normal limits.  IMPRESSION: No evidence of bowel obstruction.  Status post cholecystectomy, appendectomy, and hysterectomy.  No CT findings to account for the patient's lower abdominal pain.   Electronically Signed   By: Julian Hy M.D.   On: 09/03/2014 18:14      Assessment: Elevated liver enzymes. Hepatitis panel negative. Suspect reactive hepatopathy. LFTs today are not back yet.  Plan:   Continue supportive care. Follow clinically.    Cassell Clement 09/04/2014, 9:26 AM  Pager: [phone] If no answer or after 5 PM call [phone]

## 2014-09-04 NOTE — Progress Notes (Signed)
VASCULAR LAB PRELIMINARY  PRELIMINARY  PRELIMINARY  PRELIMINARY  Bilateral lower extremity venous duplex  completed.    Preliminary report:  Bilateral:  No evidence of DVT, superficial thrombosis, or Baker's Cyst.    Rameen Gohlke, RVT 09/04/2014, 10:32 AM

## 2014-09-04 NOTE — Progress Notes (Signed)
Per rep at [facility]:   Xarelto: $30 / no auth required   Pradaxa: $50 / no auth required   Eliquis: $30/ no auth required   All priced for 30 day retail/ patient can use any pharmacy/ if this is a maintenance medication it will have to be filled by mail order after the 4th fill retail.

## 2014-09-05 DIAGNOSIS — E10319 Type 1 diabetes mellitus with unspecified diabetic retinopathy without macular edema: Secondary | ICD-10-CM

## 2014-09-05 LAB — COMPREHENSIVE METABOLIC PANEL
ALT: 169 U/L — ABNORMAL HIGH (ref 14–54)
AST: 103 U/L — ABNORMAL HIGH (ref 15–41)
Albumin: 2.6 g/dL — ABNORMAL LOW (ref 3.5–5.0)
Alkaline Phosphatase: 350 U/L — ABNORMAL HIGH (ref 38–126)
Anion gap: 6 (ref 5–15)
BUN: 5 mg/dL — ABNORMAL LOW (ref 6–20)
CO2: 31 mmol/L (ref 22–32)
Calcium: 8.7 mg/dL — ABNORMAL LOW (ref 8.9–10.3)
Chloride: 101 mmol/L (ref 101–111)
Creatinine, Ser: 0.88 mg/dL (ref 0.44–1.00)
GFR calc Af Amer: >60 mL/min (ref >60)
GFR calc non Af Amer: >60 mL/min (ref >60)
Glucose, Bld: 92 mg/dL (ref 65–99)
Potassium: 3.9 mmol/L (ref 3.5–5.1)
Sodium: 138 mmol/L (ref 135–145)
Total Bilirubin: 0.9 mg/dL (ref 0.3–1.2)
Total Protein: 6.5 g/dL (ref 6.5–8.1)

## 2014-09-05 LAB — GLUCOSE, CAPILLARY
Glucose-Capillary: 254 mg/dL — ABNORMAL HIGH (ref 65–99)
Glucose-Capillary: 48 mg/dL — ABNORMAL LOW (ref 65–99)
Glucose-Capillary: 178 mg/dL — ABNORMAL HIGH (ref 65–99)
Glucose-Capillary: 166 mg/dL — ABNORMAL HIGH (ref 65–99)

## 2014-09-05 LAB — PROTEIN C, TOTAL: Protein C, Total: 157 % — ABNORMAL HIGH (ref 70–140)

## 2014-09-05 MED ORDER — INSULIN GLARGINE 100 UNIT/ML ~~LOC~~ SOLN
23.0000 [IU] | Freq: Every day | SUBCUTANEOUS | Status: DC
  Filled 2014-09-05: qty 0.23

## 2014-09-05 MED ORDER — TRAMADOL HCL 50 MG PO TABS: 50.0000 mg | ORAL_TABLET | Freq: Four times a day (QID) | ORAL | Status: DC | PRN

## 2014-09-05 MED ORDER — RIVAROXABAN (XARELTO) VTE STARTER PACK (15 & 20 MG): ORAL_TABLET | ORAL | Status: DC

## 2014-09-05 MED ORDER — PANTOPRAZOLE SODIUM 40 MG PO TBEC: 40.0000 mg | DELAYED_RELEASE_TABLET | Freq: Every day | ORAL | Status: DC

## 2014-09-05 MED ORDER — GLUCOSE 40 % PO GEL
ORAL | Status: AC
  Filled 2014-09-05: qty 1

## 2014-09-05 NOTE — Care Management Important Message (Signed)
Important Message  Patient Details  Name: Maureen [name] MRN: [id number] Date of Birth: 1970/12/16   Medicare Important Message Given:  Yes-second notification given    Zenon Mayo, RN 09/05/2014, 10:50 AMImportant Message  Patient Details  Name: Maureen [name] MRN: [id number] Date of Birth: 1970-07-02   Medicare Important Message Given:  Yes-second notification given    Zenon Mayo, RN 09/05/2014, 10:50 AM

## 2014-09-05 NOTE — Discharge Summary (Signed)
Physician Discharge Summary  Maureen [name] IWP:[id number] DOB: 1970/11/10 DOA: 09/02/2014  PCP: [name], MD  Admit date: 09/02/2014 Discharge date: 09/05/2014  Time spent: 35 minutes  Recommendations for Outpatient Follow-up:  1. LFTs 1 week  Discharge Diagnoses:  Principal Problem:   Acute pulmonary embolism Active Problems:   DM type 1 (diabetes mellitus, type 1)   Fibromyalgia   Diabetic retinopathy   Chest pain   Major depression   GAD (generalized anxiety disorder)   PTSD (post-traumatic stress disorder)   Transaminitis   Nausea vomiting and diarrhea   Abdominal pain   Abnormal LFTs   Dizziness   Discharge Condition: improved  Diet recommendation: cardiac/diabetic  Filed Weights   09/03/14 0153  Weight: 75.342 kg (166 lb 1.6 oz)    History of present illness:  Maureen [name] is a 44 y.o. female with PMH of hyperlipidemia, type 1 diabetes, depression, anxiety, PTSD, migraine headache, history of head injury 2010, who presents with dizziness, nausea, vomiting, diarrhea, abdominal pain, chest pain, shortness breath.  Patient reports that she had nausea, vomiting, diarrhea and abdominal pain 3 weeks ago, which had improved significantly without any treatment, but since 3 days ago, she started having similar symptoms again, with nausea, vomiting, diarrhea and abdominal pain. She vomited once today, and had 4 bowel movement withs loose stool. No blood in the vomitus or stools. Her abdominal pain is located in the peri-umbilical area, and also in the right lower quadrant, constant, nonradiating. It is not aggravated or alleviated with any known factors. She states she has dizziness andvery poor balance recently. She had urinary incontinence once yesterday. Patient does not have fever or chills. She denies drinking alcohol. She states that she took 2 pills of Tylenol 1 month ago.   She also reports having chest pain, shortness breath. Her chest pain is located in  center of chest, 10 out of 10 in severity, constant, pleuritic. It is aggravated by deep breath. Of note, she states that she traveled to and from [place] by driving 3 weeks ago. No tenderness over the calf areas. Patient does not have symptoms of UTI, unilateral weakness, rashes.  In ED, patient was found to have d-dimer elevation at 0.92. WBC 12.8, INR 1.03, negative troponin, UDS positive for benzo, negative urinalysis, normal temperature, no tachycardia, electrolytes okay. CT-head and MRI of brain are negative for acute abnormalities. She was found to have abnormal liver function with ALP 507, AST 228, ALT 348 and total bilirubin 1.6 (her liver function test on 08/02/14 showed ALP 156, AST 21, ALT 16, and TB 0.5). Ammonium level 27. Chest x-ray is negative.  Hospital Course:  Acute pulmonary embolism likely provoked. Just had trip to [place] by car. On lovenox change to xarelto -duplex negative -PRN tramadol   Transaminitis: neg hepatitis panel, treding down - need LFT in 1 week Elevated lipase: S/p cholecystectomy  Nausea vomiting and diarrhea: resolved  IDDM, uncontrolled. Adjusting insulin  Fibromyalgia  Diabetic retinopathy  Chest pain: describes substernal burning. Likely gerd. Add PPI  Major depression  GAD (generalized anxiety disorder)  PTSD (post-traumatic stress disorder)  Procedures:    Consultations:  GI  Discharge Exam: Filed Vitals:   09/05/14 0557  BP: 141/61  Pulse: 65  Temp: 98.4 F (36.9 C)  Resp: 18    General: A+Ox3, NAD Cardiovascular: rrr Respiratory: clear  Discharge Instructions    Current Discharge Medication List    CONTINUE these medications which have NOT CHANGED   Details  aspirin EC 81 MG tablet Take 1 tablet (81 mg total) by mouth daily with breakfast.    atorvastatin (LIPITOR) 40 MG tablet Take 1 tablet (40 mg total) by mouth daily. Resume home meds for high cholesterol Qty: 90 tablet, Refills: 0   Associated  Diagnoses: Other and unspecified hyperlipidemia    clonazePAM (KLONOPIN) 2 MG tablet Take 1 tablet (2 mg total) by mouth at bedtime. Qty: 30 tablet, Refills: 2    FLUoxetine (PROZAC) 40 MG capsule Take 1 capsule (40 mg total) by mouth daily. For depression Qty: 30 capsule, Refills: 5   Associated Diagnoses: Major depressive disorder, recurrent episode, moderate; GAD (generalized anxiety disorder); PTSD (post-traumatic stress disorder)    HUMALOG KWIKPEN 100 UNIT/ML KiwkPen Inject 8-10 Units into the skin 3 (three) times daily. Refills: 6    insulin glargine (LANTUS) 100 UNIT/ML injection Inject 26 Units into the skin at bedtime.     dicyclomine (BENTYL) 20 MG tablet Take 1 tablet (20 mg total) by mouth 2 (two) times daily. Qty: 20 tablet, Refills: 0    insulin aspart (NOVOLOG) 100 UNIT/ML injection Inject 8 Units into the skin 3 (three) times daily with meals. Qty: 10 mL, Refills: 11    loperamide (IMODIUM) 2 MG capsule Take 1 capsule (2 mg total) by mouth 4 (four) times daily as needed for diarrhea or loose stools. Qty: 12 capsule, Refills: 0    oxyCODONE-acetaminophen (PERCOCET/ROXICET) 5-325 MG per tablet 1 to 2 tabs PO q6hrs  PRN for pain Qty: 15 tablet, Refills: 0       Allergies  Allergen Reactions  . Cephalexin Rash    Pt was admitted to the hospital upon taking.  . Ibuprofen Nausea And Vomiting and Rash  . Meloxicam Nausea Only      The results of significant diagnostics from this hospitalization (including imaging, microbiology, ancillary and laboratory) are listed below for reference.    Significant Diagnostic Studies: Dg Chest 2 View  09/02/2014   CLINICAL DATA:  Intermittent chest pain for 2 days  EXAM: CHEST  2 VIEW  COMPARISON:  07/28/2014  FINDINGS: The heart size and mediastinal contours are within normal limits. Both lungs are clear. The visualized skeletal structures are unremarkable.  IMPRESSION: No active cardiopulmonary disease.   Electronically Signed    By: Lahoma Crocker M.D.   On: 09/02/2014 15:32   Ct Head Wo Contrast  09/02/2014   CLINICAL DATA:  Dizziness.  Urinary incontinence.  Chest pain.  EXAM: CT HEAD WITHOUT CONTRAST  TECHNIQUE: Contiguous axial images were obtained from the base of the skull through the vertex without intravenous contrast.  COMPARISON:  CT head without contrast 10/30/2013.  FINDINGS: No acute cortical infarct, hemorrhage, or mass lesion is present. The ventricles are of normal size. No significant extra-axial fluid collection is evident. The paranasal sinuses and mastoid air cells are clear. The calvarium is intact. No significant extracranial soft tissue lesion is present. The globes and orbits are intact.  IMPRESSION: Negative CT of the head.   Electronically Signed   By: San Morelle M.D.   On: 09/02/2014 18:49   Ct Angio Chest Pe W/cm &/or Wo Cm  09/03/2014   CLINICAL DATA:  Chest pain and elevated D-dimer. Recent long distance travel  EXAM: CT ANGIOGRAPHY CHEST WITH CONTRAST  TECHNIQUE: Multidetector CT imaging of the chest was performed using the standard protocol during bolus administration of intravenous contrast. Multiplanar CT image reconstructions and MIPs were obtained to evaluate the vascular anatomy.  CONTRAST:  171mL OMNIPAQUE IOHEXOL 350 MG/ML SOLN  COMPARISON:  None.  FINDINGS: THORACIC INLET/BODY WALL:  No acute abnormality.  MEDIASTINUM:  Single subsegmental filling defect at a branch point on series 406, image 108, most convincing on sagittal reconstruction.  Normal heart size. No pericardial effusion. No aortic aneurysm or dissection. No adenopathy.  LUNG WINDOWS:  No consolidation.  No effusion.  UPPER ABDOMEN:  No acute findings.  OSSEOUS:  No acute fracture.  No suspicious lytic or blastic lesions.  Critical Value/emergent results were called by telephone at the time of interpretation on 09/03/2014 at 4:01 am to [facility], who verbally acknowledged these results.  Review of the MIP images confirms the  above findings.  IMPRESSION: Single subsegmental pulmonary embolism to the right upper lobe.   Electronically Signed   By: Monte Fantasia M.D.   On: 09/03/2014 04:04   Mr Brain Wo Contrast  09/02/2014   CLINICAL DATA:  Dizziness and lightheadedness 1 trying to walk. Symptoms began when she sits up and are worse with standing.  EXAM: MRI HEAD WITHOUT CONTRAST  TECHNIQUE: Multiplanar, multiecho pulse sequences of the brain and surrounding structures were obtained without intravenous contrast.  COMPARISON:  None.  FINDINGS: No acute infarct, hemorrhage, or mass lesion is present. The ventricles are of normal size. No significant extraaxial fluid collection is present.  Flow is present in the major intracranial arteries. No significant white matter disease is present. The globes and orbits are intact. The paranasal sinuses and mastoid air cells are clear.  Skullbase is within normal limits. Midline structures are unremarkable.  IMPRESSION: Negative MRI of the brain.   Electronically Signed   By: San Morelle M.D.   On: 09/02/2014 21:34   Ct Abdomen Pelvis W Contrast  09/03/2014   CLINICAL DATA:  Nausea/vomiting, diarrhea, right lower abdominal pain. Prior cholecystectomy, appendectomy, and hysterectomy.  EXAM: CT ABDOMEN AND PELVIS WITH CONTRAST  TECHNIQUE: Multidetector CT imaging of the abdomen and pelvis was performed using the standard protocol following bolus administration of intravenous contrast.  CONTRAST:  139mL OMNIPAQUE IOHEXOL 300 MG/ML  SOLN  COMPARISON:  None.  FINDINGS: Lower chest:  Trace bilateral pleural effusions.  Hepatobiliary: Liver is within normal limits. No suspicious/enhancing hepatic lesions.  Status post cholecystectomy. No intrahepatic or extrahepatic ductal dilatation.  Pancreas: Within normal limits.  Spleen: Within normal limits.  Adrenals/Urinary Tract: Adrenal glands are within normal limits.  Kidneys are within normal limits.  No hydronephrosis.  Bladder is mildly  thick-walled decreased although underdistended. Excretory contrast from recent CTA chest.  Stomach/Bowel: Stomach is within normal limits.  Visualized bowel is unremarkable.  Prior appendectomy.  Vascular/Lymphatic: No evidence of abdominal aortic aneurysm.  No suspicious abdominopelvic lymphadenopathy.  Reproductive: Suspected prior supracervical hysterectomy.  No adnexal masses.  Other: Trace pelvic ascites.  Musculoskeletal: Visualized osseous structures are within normal limits.  IMPRESSION: No evidence of bowel obstruction.  Status post cholecystectomy, appendectomy, and hysterectomy.  No CT findings to account for the patient's lower abdominal pain.   Electronically Signed   By: Julian Hy M.D.   On: 09/03/2014 18:14   US Abdomen Limited Ruq  09/03/2014   CLINICAL DATA:  Right upper quadrant pain for 2 days with transaminitis  EXAM: US ABDOMEN LIMITED - RIGHT UPPER QUADRANT  COMPARISON:  Abdominal CT from 1 month ago  FINDINGS: Gallbladder:  Cholecystectomy.  No gallbladder fossa fluid collection.  Common bile duct:  Diameter: 4 mm.  Liver:  No focal lesion identified. Within normal limits in parenchymal  echogenicity.  Fluid noted around the pancreatic head is ovoid and consistent with second portion duodenum. No definitive changes of pancreatitis.  IMPRESSION: Negative right upper quadrant ultrasound after cholecystectomy.   Electronically Signed   By: Monte Fantasia M.D.   On: 09/03/2014 00:18    Microbiology: Recent Results (from the past 240 hour(s))  Culture, blood (x 2)     Status: None (Preliminary result)   Collection Time: 09/03/14  4:37 AM  Result Value Ref Range Status   Specimen Description BLOOD LEFT ANTECUBITAL  Final   Special Requests BOTTLES DRAWN AEROBIC AND ANAEROBIC 5CC   Final   Culture NO GROWTH 1 DAY  Final   Report Status PENDING  Incomplete  Culture, blood (x 2)     Status: None (Preliminary result)   Collection Time: 09/03/14  7:00 AM  Result Value Ref Range  Status   Specimen Description BLOOD LEFT HAND  Final   Special Requests BOTTLES DRAWN AEROBIC ONLY 1CC  Final   Culture NO GROWTH 1 DAY  Final   Report Status PENDING  Incomplete     Labs: Basic Metabolic Panel:  Recent Labs Lab 09/02/14 1547 09/02/14 1813 09/03/14 0513 09/04/14 0730 09/05/14 0615  NA 133* 134* 138 139 138  K 3.5 4.7 3.7 4.1 3.9  CL 91* 91* 102 104 101  CO2 20* 26 24 25 31   GLUCOSE 306* 362* 244* 244* 92  BUN 9 6 7  <5* 5*  CREATININE 1.14* 1.01* 0.81 0.80 0.88  CALCIUM 9.3 9.4 8.3* 8.7* 8.7*   Liver Function Tests:  Recent Labs Lab 09/02/14 1813 09/03/14 0513 09/04/14 0730 09/05/14 0615  AST 228* 105* 252* 103*  ALT 348* 222* 234* 169*  ALKPHOS 507* 343* 378* 350*  BILITOT 1.6* 0.7 0.5 0.9  PROT 8.1 5.8* 6.7 6.5  ALBUMIN 3.2* 2.4* 2.6* 2.6*    Recent Labs Lab 09/03/14 0513  LIPASE 61*    Recent Labs Lab 09/02/14 2010  AMMONIA 27   CBC:  Recent Labs Lab 09/02/14 1547 09/03/14 0513  WBC 12.8* 10.7*  HGB 14.1 12.0  HCT 40.7 35.7*  MCV 97.4 97.0  PLT 461* 395   Cardiac Enzymes:  Recent Labs Lab 09/02/14 1813 09/03/14 0513 09/03/14 0858  TROPONINI <0.03 <0.03 <0.03   BNP: BNP (last 3 results)  Recent Labs  03/26/14 1531  BNP 27.9    ProBNP (last 3 results) No results for input(s): PROBNP in the last 8760 hours.  CBG:  Recent Labs Lab 09/04/14 2101 09/04/14 2244 09/05/14 0810 09/05/14 0949 09/05/14 1144  GLUCAP 254* 258* 48* 178* 166*       Signed:  Kynan Peasley  [facility] 09/05/2014, 2:10 PM

## 2014-09-05 NOTE — Progress Notes (Signed)
[facility] Progress Note  Subjective: No complaints of abdominal pain. LFTs are trending down  Objective: Vital signs in last 24 hours: Temp:  [98.4 F (36.9 C)-98.5 F (36.9 C)] 98.4 F (36.9 C) (08/17 0557) Pulse Rate:  [64-68] 65 (08/17 0557) Resp:  [18-20] 18 (08/17 0557) BP: (113-165)/(61-72) 141/61 mmHg (08/17 0557) SpO2:  [94 %-98 %] 95 % (08/17 0557) Weight change:    PE:  No distress  Heart regular rhythm  Lungs clear  Abdomen soft and nontender  Lab Results: Results for orders placed or performed during the hospital encounter of 09/02/14 (from the past 24 hour(s))  Glucose, capillary     Status: Abnormal   Collection Time: 09/04/14  3:44 PM  Result Value Ref Range   Glucose-Capillary 35 (LL) 65 - 99 mg/dL   Comment 1 Notify RN   Glucose, capillary     Status: Abnormal   Collection Time: 09/04/14  4:12 PM  Result Value Ref Range   Glucose-Capillary 43 (LL) 65 - 99 mg/dL  Glucose, capillary     Status: None   Collection Time: 09/04/14  5:10 PM  Result Value Ref Range   Glucose-Capillary 73 65 - 99 mg/dL  Glucose, capillary     Status: Abnormal   Collection Time: 09/04/14  9:01 PM  Result Value Ref Range   Glucose-Capillary 254 (H) 65 - 99 mg/dL  Glucose, capillary     Status: Abnormal   Collection Time: 09/04/14 10:44 PM  Result Value Ref Range   Glucose-Capillary 258 (H) 65 - 99 mg/dL  Comprehensive metabolic panel     Status: Abnormal   Collection Time: 09/05/14  6:15 AM  Result Value Ref Range   Sodium 138 135 - 145 mmol/L   Potassium 3.9 3.5 - 5.1 mmol/L   Chloride 101 101 - 111 mmol/L   CO2 31 22 - 32 mmol/L   Glucose, Bld 92 65 - 99 mg/dL   BUN 5 (L) 6 - 20 mg/dL   Creatinine, Ser 0.88 0.44 - 1.00 mg/dL   Calcium 8.7 (L) 8.9 - 10.3 mg/dL   Total Protein 6.5 6.5 - 8.1 g/dL   Albumin 2.6 (L) 3.5 - 5.0 g/dL   AST 103 (H) 15 - 41 U/L   ALT 169 (H) 14 - 54 U/L   Alkaline Phosphatase 350 (H) 38 - 126 U/L   Total Bilirubin 0.9 0.3  - 1.2 mg/dL   GFR calc non Af Amer >60 >60 mL/min   GFR calc Af Amer >60 >60 mL/min   Anion gap 6 5 - 15  Glucose, capillary     Status: Abnormal   Collection Time: 09/05/14  8:10 AM  Result Value Ref Range   Glucose-Capillary 48 (L) 65 - 99 mg/dL  Glucose, capillary     Status: Abnormal   Collection Time: 09/05/14  9:49 AM  Result Value Ref Range   Glucose-Capillary 178 (H) 65 - 99 mg/dL  Glucose, capillary     Status: Abnormal   Collection Time: 09/05/14 11:44 AM  Result Value Ref Range   Glucose-Capillary 166 (H) 65 - 99 mg/dL    Studies/Results: No results found.    Assessment: Transaminitis. Suspect this is a reactive hepatopathy due to underlying systemic illness. Labs seem to be trending down  Plan:   Follow clinically. She is probably stable from a GI standpoint to go home. Not sure if this is the case in regards to her pulmonary embolus however. She should have follow-up LFTs if she  goes home in about a week. She can follow-up with her PCP or with [place]. We will sign off. Call us if needed.    Cassell Clement 09/05/2014, 11:56 AM  Pager: [phone] If no answer or after 5 PM call [phone] Lab Results  Component Value Date   HGB 12.0 09/03/2014   HGB 14.1 09/02/2014   HGB 14.6 08/02/2014   HGB 15.7 12/18/2012   HGB 14.8 09/13/2012   HGB 13.5 08/14/2011   HGB 14.2 05/16/2008   HCT 35.7* 09/03/2014   HCT 40.7 09/02/2014   HCT 42.6 08/02/2014   HCT 50.3* 12/18/2012   HCT 46.0 09/13/2012   HCT 43.1 08/14/2011   HCT 42.4 05/16/2008   ALKPHOS 350* 09/05/2014   ALKPHOS 378* 09/04/2014   ALKPHOS 343* 09/03/2014   AST 103* 09/05/2014   AST 252* 09/04/2014   AST 105* 09/03/2014   ALT 169* 09/05/2014   ALT 234* 09/04/2014   ALT 222* 09/03/2014

## 2014-09-06 LAB — PROTEIN S, TOTAL: Protein S Ag, Total: >150 % — ABNORMAL HIGH (ref 58–150)

## 2014-09-06 LAB — LUPUS ANTICOAGULANT PANEL
DRVVT: 36.3 s (ref 0.0–55.1)
PTT Lupus Anticoagulant: 41.6 s (ref 0.0–50.0)

## 2014-09-06 LAB — FACTOR 5 LEIDEN

## 2014-09-06 LAB — PROTEIN C ACTIVITY: Protein C Activity: 186 % — ABNORMAL HIGH (ref 74–151)

## 2014-09-06 LAB — PROTEIN S ACTIVITY: Protein S Activity: 81 % (ref 60–145)

## 2014-09-06 LAB — PROTHROMBIN GENE MUTATION

## 2014-09-08 LAB — CULTURE, BLOOD (ROUTINE X 2)
Culture: NO GROWTH
Culture: NO GROWTH

## 2014-09-19 ENCOUNTER — Emergency Department (HOSPITAL_COMMUNITY): Payer: BLUE CROSS/BLUE SHIELD

## 2014-09-19 ENCOUNTER — Encounter (HOSPITAL_COMMUNITY): Payer: Self-pay | Admitting: *Deleted

## 2014-09-19 ENCOUNTER — Emergency Department (HOSPITAL_COMMUNITY)
Admission: EM | Admit: 2014-09-19 | Discharge: 2014-09-20 | Disposition: A | Discharge status: Home / Self Care | Payer: BLUE CROSS/BLUE SHIELD | Attending: Emergency Medicine | Admitting: Emergency Medicine

## 2014-09-19 DIAGNOSIS — Z87442 Personal history of urinary calculi: Secondary | ICD-10-CM | POA: Diagnosis not present

## 2014-09-19 DIAGNOSIS — F419 Anxiety disorder, unspecified: Secondary | ICD-10-CM | POA: Diagnosis not present

## 2014-09-19 DIAGNOSIS — Z87828 Personal history of other (healed) physical injury and trauma: Secondary | ICD-10-CM | POA: Insufficient documentation

## 2014-09-19 DIAGNOSIS — R0781 Pleurodynia: Secondary | ICD-10-CM

## 2014-09-19 DIAGNOSIS — E1040 Type 1 diabetes mellitus with diabetic neuropathy, unspecified: Secondary | ICD-10-CM | POA: Diagnosis not present

## 2014-09-19 DIAGNOSIS — R079 Chest pain, unspecified: Secondary | ICD-10-CM | POA: Diagnosis present

## 2014-09-19 DIAGNOSIS — M1990 Unspecified osteoarthritis, unspecified site: Secondary | ICD-10-CM | POA: Diagnosis not present

## 2014-09-19 DIAGNOSIS — Z79899 Other long term (current) drug therapy: Secondary | ICD-10-CM | POA: Insufficient documentation

## 2014-09-19 DIAGNOSIS — F329 Major depressive disorder, single episode, unspecified: Secondary | ICD-10-CM | POA: Diagnosis not present

## 2014-09-19 DIAGNOSIS — G43909 Migraine, unspecified, not intractable, without status migrainosus: Secondary | ICD-10-CM | POA: Insufficient documentation

## 2014-09-19 DIAGNOSIS — Z794 Long term (current) use of insulin: Secondary | ICD-10-CM | POA: Insufficient documentation

## 2014-09-19 DIAGNOSIS — R058 Other specified cough: Secondary | ICD-10-CM

## 2014-09-19 DIAGNOSIS — Z7901 Long term (current) use of anticoagulants: Secondary | ICD-10-CM | POA: Diagnosis not present

## 2014-09-19 DIAGNOSIS — R05 Cough: Secondary | ICD-10-CM | POA: Insufficient documentation

## 2014-09-19 DIAGNOSIS — Z7982 Long term (current) use of aspirin: Secondary | ICD-10-CM | POA: Insufficient documentation

## 2014-09-19 LAB — I-STAT TROPONIN, ED
Troponin i, poc: 0 ng/mL (ref 0.00–0.08)
Troponin i, poc: 0 ng/mL (ref 0.00–0.08)

## 2014-09-19 LAB — CBC
HCT: 40.6 % (ref 36.0–46.0)
Hemoglobin: 13.3 g/dL (ref 12.0–15.0)
MCH: 32.5 pg (ref 26.0–34.0)
MCHC: 32.8 g/dL (ref 30.0–36.0)
MCV: 99.3 fL (ref 78.0–100.0)
Platelets: 390 10*3/uL (ref 150–400)
RBC: 4.09 MIL/uL (ref 3.87–5.11)
RDW: 13.6 % (ref 11.5–15.5)
WBC: 12.9 10*3/uL — ABNORMAL HIGH (ref 4.0–10.5)

## 2014-09-19 LAB — BASIC METABOLIC PANEL
Anion gap: 9 (ref 5–15)
BUN: <5 mg/dL — ABNORMAL LOW (ref 6–20)
CO2: 29 mmol/L (ref 22–32)
Calcium: 9.6 mg/dL (ref 8.9–10.3)
Chloride: 103 mmol/L (ref 101–111)
Creatinine, Ser: 0.89 mg/dL (ref 0.44–1.00)
GFR calc Af Amer: >60 mL/min (ref >60)
GFR calc non Af Amer: >60 mL/min (ref >60)
Glucose, Bld: 54 mg/dL — ABNORMAL LOW (ref 65–99)
Potassium: 3.6 mmol/L (ref 3.5–5.1)
Sodium: 141 mmol/L (ref 135–145)

## 2014-09-19 LAB — HEPATIC FUNCTION PANEL
ALT: 29 U/L (ref 14–54)
AST: 45 U/L — ABNORMAL HIGH (ref 15–41)
Albumin: 3.3 g/dL — ABNORMAL LOW (ref 3.5–5.0)
Alkaline Phosphatase: 204 U/L — ABNORMAL HIGH (ref 38–126)
Bilirubin, Direct: <0.1 mg/dL — ABNORMAL LOW (ref 0.1–0.5)
Total Bilirubin: 0.6 mg/dL (ref 0.3–1.2)
Total Protein: 7.4 g/dL (ref 6.5–8.1)

## 2014-09-19 LAB — BRAIN NATRIURETIC PEPTIDE: B Natriuretic Peptide: 9.1 pg/mL (ref 0.0–100.0)

## 2014-09-19 MED ORDER — IOHEXOL 350 MG/ML SOLN
80.0000 mL | Freq: Once | INTRAVENOUS | Status: AC | PRN
  Administered 2014-09-19: 80 mL via INTRAVENOUS

## 2014-09-19 MED ORDER — SODIUM CHLORIDE 0.9 % IV BOLUS (SEPSIS)
1000.0000 mL | Freq: Once | INTRAVENOUS | Status: AC
  Administered 2014-09-19: 1000 mL via INTRAVENOUS

## 2014-09-19 MED ORDER — MORPHINE SULFATE (PF) 4 MG/ML IV SOLN
4.0000 mg | Freq: Once | INTRAVENOUS | Status: AC
  Administered 2014-09-19: 4 mg via INTRAVENOUS
  Filled 2014-09-19: qty 1

## 2014-09-19 NOTE — ED Notes (Signed)
Lab will add on BNP and HFP

## 2014-09-19 NOTE — ED Provider Notes (Signed)
CSN: [id number]     Arrival date & time 09/19/14  1719 History   First MD Initiated Contact with Patient 09/19/14 1957     Chief Complaint  Patient presents with  . Shortness of Breath  . Chest Pain     (Consider location/radiation/quality/duration/timing/severity/associated sxs/prior Treatment) HPI Comments: 44 y.o. female with PMH of hyperlipidemia, type 1 diabetes, depression, anxiety, PTSD, migraine headache, recent hx of PE who is started on xarelto who presents with cc of chest pain, dib. 3 days ago, pt reports that she was trying to cook dinner and started having chest pain and dib. Today the symptoms got worse, so she came to the ER. Chest pain is in the middle of the chest and is non radiating. Pain has no aggravating or relieving factors. She has a cough, no hemoptysis. No fevers, chills. Shortness of breath is exertional - but she feels short of breath even when laying. PT has been compliant with her meds. No hx of CAD or chest pains from suspected heart dz. Pt has been dizzy since even before the admission.  Patient is a 44 y.o. female presenting with shortness of breath and chest pain. The history is provided by the patient.  Shortness of Breath Associated symptoms: chest pain   Chest Pain Associated symptoms: shortness of breath     Past Medical History  Diagnosis Date  . 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 Fibromyalgia   . Hyperlipidemia   . Abdominal pain   . Nausea & vomiting   . Biliary dyskinesia   . Depression   . Joint pain   . Skin rash     due to medications  . Post traumatic stress disorder (PTSD)   . Sleep trouble   . Major depressive disorder   . Cervical strain   . Closed head injury 2010  . Renal stones   . Migraine     hx of none recent  . Arthritis   . Hypoglycemia   . Anxiety    Past Surgical History  Procedure Laterality Date  . Lipoma removed  yrs ago  . Knee surgery Left 2012  . Cesarean section   1997, 1999  . Cholecystectomy  02/10/2011    Procedure: LAPAROSCOPIC CHOLECYSTECTOMY WITH INTRAOPERATIVE CHOLANGIOGRAM;  Surgeon: Judieth Keens, DO;  Location: [facility];  Service: General;  Laterality: N/A;  . Robotic assisted laparoscopic lysis of adhesion N/A 03/04/2012    Procedure: ROBOTIC ASSISTED LAPAROSCOPIC LYSIS OF EXTENSIVE ADHESIONS;  Surgeon: Claiborne Billings A. Pamala Hurry, MD;  Location: [facility];  Service: Gynecology;  Laterality: N/A;  . Tonsillectomy  2001  . Abdominal hysterectomy  2001  . Eye surgery  2006    laser eye surgery left eye  . Exploratory laparomty  Mar 04, 2012  . Laparoscopic appendectomy N/A 10/12/2012    Procedure: DIAGNOSTIC APPENDECTOMY LAPAROSCOPIC;  Surgeon: Madilyn Hook, DO;  Location: [facility];  Service: General;  Laterality: N/A;  . Appendectomy     Family History  Problem Relation Age of Onset  . Cancer Father     lymphoma  . Nephrolithiasis Father   . Vascular Disease Father   . Alcohol abuse Father   . Drug abuse Father   . Cancer Maternal Grandmother     colon  . Hypertension Mother   . Heart disease Mother   . Cataracts Mother   . Depression Mother   . Diabetes Brother   . Drug abuse Brother   . Mental illness  Maternal Grandfather   . Cancer Maternal Grandfather   . Heart disease Paternal Grandmother   . Heart disease Paternal Grandfather   . Suicidality Maternal Uncle    Social History  Substance Use Topics  . Smoking status: Never Smoker   . Smokeless tobacco: Never Used  . Alcohol Use: No   OB History    No data available     Review of Systems  Respiratory: Positive for shortness of breath.   Cardiovascular: Positive for chest pain.  All other systems reviewed and are negative.     Allergies  Cephalexin; Ibuprofen; and Meloxicam  Home Medications   Prior to Admission medications   Medication Sig Start Date End Date Taking? Authorizing Provider  aspirin EC 81 MG tablet Take 1 tablet (81 mg total) by mouth daily with breakfast.  10/16/13  Yes Kerrie Buffalo, NP  clonazePAM (KLONOPIN) 2 MG tablet Take 1 tablet (2 mg total) by mouth at bedtime. Patient taking differently: Take 2 mg by mouth at bedtime as needed for anxiety.  07/31/14 07/31/15 Yes Leonides Grills, MD  dicyclomine (BENTYL) 20 MG tablet Take 1 tablet (20 mg total) by mouth 2 (two) times daily. 08/02/14  Yes Nicole Pisciotta, PA-C  FLUoxetine (PROZAC) 40 MG capsule Take 1 capsule (40 mg total) by mouth daily. For depression 05/29/14  Yes Charlcie Cradle, MD  insulin aspart (NOVOLOG) 100 UNIT/ML injection Inject 8 Units into the skin 3 (three) times daily with meals. 10/16/13  Yes Kerrie Buffalo, NP  insulin glargine (LANTUS) 100 UNIT/ML injection Inject 26 Units into the skin at bedtime.    Yes Historical Provider, MD  loperamide (IMODIUM) 2 MG capsule Take 1 capsule (2 mg total) by mouth 4 (four) times daily as needed for diarrhea or loose stools. 08/02/14  Yes Nicole Pisciotta, PA-C  oxyCODONE-acetaminophen (PERCOCET/ROXICET) 5-325 MG per tablet 1 to 2 tabs PO q6hrs  PRN for pain Patient taking differently: Take 1-2 tablets by mouth every 6 (six) hours as needed for moderate pain.  08/02/14  Yes Nicole Pisciotta, PA-C  pantoprazole (PROTONIX) 40 MG tablet Take 1 tablet (40 mg total) by mouth daily. 09/05/14  Yes Geradine Girt, DO  Rivaroxaban (XARELTO STARTER PACK) 15 & 20 MG TBPK Take as directed on package: Start with one 15mg  tablet by mouth twice a day with food. On Day 22, switch to one 20mg  tablet once a day with food. 09/05/14  Yes Geradine Girt, DO  traMADol (ULTRAM) 50 MG tablet Take 1 tablet (50 mg total) by mouth every 6 (six) hours as needed for moderate pain. 09/05/14  Yes Geradine Girt, DO  levofloxacin (LEVAQUIN) 750 MG tablet Take 1 tablet (750 mg total) by mouth daily. 09/20/14   Charie Pinkus, MD   BP 148/66 mmHg  Pulse 65  Temp(Src) 97.8 F (36.6 C) (Oral)  Resp 16  Ht 5\' 4"  (1.626 m)  Wt 165 lb (74.844 kg)  BMI 28.31 kg/m2  SpO2  98% Physical Exam  Constitutional: She is oriented to person, place, and time. She appears well-developed and well-nourished.  HENT:  Head: Normocephalic and atraumatic.  Eyes: EOM are normal. Pupils are equal, round, and reactive to light.  Neck: Neck supple.  Cardiovascular: Normal rate, regular rhythm and normal heart sounds.   No murmur heard. Pulmonary/Chest: Effort normal. No respiratory distress.  Abdominal: Soft. She exhibits no distension. There is no tenderness. There is no rebound and no guarding.  Neurological: She is alert and oriented to person, place,  and time.  Skin: Skin is warm and dry.  Vitals reviewed.   ED Course  Procedures (including critical care time) Labs Review Labs Reviewed  BASIC METABOLIC PANEL - Abnormal; Notable for the following:    Glucose, Bld 54 (*)    BUN <5 (*)    All other components within normal limits  CBC - Abnormal; Notable for the following:    WBC 12.9 (*)    All other components within normal limits  HEPATIC FUNCTION PANEL - Abnormal; Notable for the following:    Albumin 3.3 (*)    AST 45 (*)    Alkaline Phosphatase 204 (*)    Bilirubin, Direct <0.1 (*)    All other components within normal limits  BRAIN NATRIURETIC PEPTIDE  I-STAT TROPOININ, ED  Randolm Idol, ED    Imaging Review Dg Chest 2 View  09/19/2014   CLINICAL DATA:  Chest pain and shortness of breath. Patient was diagnosed with pulmonary embolus last week.  EXAM: CHEST  2 VIEW  COMPARISON:  CTA of the chest dated 09/03/2014  FINDINGS: Cardiomediastinal silhouette is normal. Mediastinal contours appear intact.  There is no evidence of focal airspace consolidation, pleural effusion or pneumothorax.  Osseous structures are without acute abnormality. Soft tissues are grossly normal.  IMPRESSION: No radiographic evidence of acute cardiopulmonary abnormality.   Electronically Signed   By: Fidela Salisbury M.D.   On: 09/19/2014 18:29   Ct Angio Chest Pe W/cm &/or Wo  Cm  09/20/2014   CLINICAL DATA:  Recurrent chest pain and dyspnea, diagnosed with pulmonary emboli 2 weeks ago.  EXAM: CT ANGIOGRAPHY CHEST WITH CONTRAST  TECHNIQUE: Multidetector CT imaging of the chest was performed using the standard protocol during bolus administration of intravenous contrast. Multiplanar CT image reconstructions and MIPs were obtained to evaluate the vascular anatomy.  CONTRAST:  69mL OMNIPAQUE IOHEXOL 350 MG/ML SOLN  COMPARISON:  09/03/2014  FINDINGS: Cardiovascular: There is good opacification of the pulmonary arteries. There is no pulmonary embolism. The subsegmental right upper lobe pulmonary artery has resolved since 09/03/2014. The thoracic aorta is normal in caliber and intact.  Lungs: There is confluent alveolar opacity in the posterior right upper lobe and to lesser degree in the posterior left upper lobe. This is new. This could represent pneumonia. Pulmonary hemorrhage is less likely but not excluded.  Central airways: Patent  Effusions: None  Lymphadenopathy: None  Esophagus: Unremarkable  Upper abdomen: Small hiatal hernia  Musculoskeletal: No significant abnormality  Review of the MIP images confirms the above findings.  IMPRESSION: 1. Resolution of the subsegmental right upper lobe pulmonary embolism since 09/03/2014. No pulmonary emboli. 2. Posterior upper lobe alveolar opacities bilaterally, right greater than left. This may represent pneumonia or less likely pulmonary hemorrhage. 3. Small hiatal hernia   Electronically Signed   By: Andreas Newport M.D.   On: 09/20/2014 00:04   I have personally reviewed and evaluated these images and lab results as part of my medical decision-making.   EKG Interpretation   Date/Time:  Wednesday September 19 2014 17:24:14 EDT Ventricular Rate:  87 PR Interval:  114 QRS Duration: 80 QT Interval:  366 QTC Calculation: 440 R Axis:   87 Text Interpretation:  Normal sinus rhythm Nonspecific T wave abnormality  Abnormal ECG new s1q3t3  pattern Confirmed by Kathrynn Humble, MD, Deo Mehringer [phone])  on 09/19/2014 7:57:44 PM      MDM   Final diagnoses:  Pleuritic pain  Dry cough    Pt comes in with cc of  chest pain and dib. She was recently admitted to the hospital for elevated LFTs and dizziness. She has MRI head - neg. And CT PE for elevated dimer- which was positive. She reports that over the past 3 days she has started having chest pain again, and iti s getting worse and she has dyspnea as well. She is on xarelto. We are not sure why her LFTs were elevated. EKG here shows new s1q3t3 indicating possibly worsening right sided heart strain. Will get CT PE to evaluate the clot. It is possible that she is not responding to xarelto, especially given there might be hypercoagulability in her, with the elevated LFTs.   12:42 AM Pt's CT is reassuring. CAP vs. Pulm hemorrhage. She has a dry cough, pleuritic chest pain, dyspnea - which is possible with both. She has no hemoptysis. She has no fevers, but has a mild WC and she is diabetic.  Symptoms have been present for 3 days, if this was aggressive infection, which patient is at risk for due to recent admission, her infection would have been more convincing. So i discussed all the findings with the patient, and we have decided to start her on levaquin, and she will return to the ER if there is signs of worsening infection, or pulm hemorrhage,     Varney Biles, MD 09/20/14 [phone]

## 2014-09-19 NOTE — ED Notes (Signed)
The pt is sl cool but she does not want another blanket

## 2014-09-19 NOTE — ED Notes (Signed)
Pt reports SOB and chest pain that started over 2 weeks ago. Pt was admitted for PE. Pt was placed on blood thinners and has been taking them as prescribed. Pt states that pain and SOB has continued since. Denies any change in type of pain.

## 2014-09-19 NOTE — ED Notes (Signed)
Pt went tio c-t and returned

## 2014-09-20 MED ORDER — LEVOFLOXACIN 750 MG PO TABS
750.0000 mg | ORAL_TABLET | Freq: Once | ORAL | Status: AC
  Administered 2014-09-20: 750 mg via ORAL
  Filled 2014-09-20: qty 1

## 2014-09-20 MED ORDER — LEVOFLOXACIN 750 MG PO TABS: 750.0000 mg | ORAL_TABLET | Freq: Every day | ORAL | Status: DC

## 2014-09-20 NOTE — Discharge Instructions (Signed)
Please take the medicine prescribed for suspected pneumonia. As discussed, blood clot has almost resolved.  Please return to the ER if your symptoms worsen; you have increased pain, fevers, chills, shortness of breath, inability to keep any medications down, blood with cough. Otherwise see the outpatient doctor as requested in 5-7 days.  Pneumonia Pneumonia is an infection of the lungs.  CAUSES Pneumonia may be caused by bacteria or a virus. Usually, these infections are caused by breathing infectious particles into the lungs (respiratory tract). SIGNS AND SYMPTOMS   Cough.  Fever.  Chest pain.  Increased rate of breathing.  Wheezing.  Mucus production. DIAGNOSIS  If you have the common symptoms of pneumonia, your health care provider will typically confirm the diagnosis with a chest X-ray. The X-ray will show an abnormality in the lung (pulmonary infiltrate) if you have pneumonia. Other tests of your blood, urine, or sputum may be done to find the specific cause of your pneumonia. Your health care provider may also do tests (blood gases or pulse oximetry) to see how well your lungs are working. TREATMENT  Some forms of pneumonia may be spread to other people when you cough or sneeze. You may be asked to wear a mask before and during your exam. Pneumonia that is caused by bacteria is treated with antibiotic medicine. Pneumonia that is caused by the influenza virus may be treated with an antiviral medicine. Most other viral infections must run their course. These infections will not respond to antibiotics.  HOME CARE INSTRUCTIONS   Cough suppressants may be used if you are losing too much rest. However, coughing protects you by clearing your lungs. You should avoid using cough suppressants if you can.  Your health care provider may have prescribed medicine if he or she thinks your pneumonia is caused by bacteria or influenza. Finish your medicine even if you start to feel better.  Your  health care provider may also prescribe an expectorant. This loosens the mucus to be coughed up.  Take medicines only as directed by your health care provider.  Do not smoke. Smoking is a common cause of bronchitis and can contribute to pneumonia. If you are a smoker and continue to smoke, your cough may last several weeks after your pneumonia has cleared.  A cold steam vaporizer or humidifier in your room or home may help loosen mucus.  Coughing is often worse at night. Sleeping in a semi-upright position in a recliner or using a couple pillows under your head will help with this.  Get rest as you feel it is needed. Your body will usually let you know when you need to rest. PREVENTION A pneumococcal shot (vaccine) is available to prevent a common bacterial cause of pneumonia. This is usually suggested for:  People over 33 years old.  Patients on chemotherapy.  People with chronic lung problems, such as bronchitis or emphysema.  People with immune system problems. If you are over 65 or have a high risk condition, you may receive the pneumococcal vaccine if you have not received it before. In some countries, a routine influenza vaccine is also recommended. This vaccine can help prevent some cases of pneumonia.You may be offered the influenza vaccine as part of your care. If you smoke, it is time to quit. You may receive instructions on how to stop smoking. Your health care provider can provide medicines and counseling to help you quit. SEEK MEDICAL CARE IF: You have a fever. SEEK IMMEDIATE MEDICAL CARE IF:  Your illness becomes worse. This is especially true if you are elderly or weakened from any other disease.  You cannot control your cough with suppressants and are losing sleep.  You begin coughing up blood.  You develop pain which is getting worse or is uncontrolled with medicines.  Any of the symptoms which initially brought you in for treatment are getting worse rather than  better.  You develop shortness of breath or chest pain. MAKE SURE YOU:   Understand these instructions.  Will watch your condition.  Will get help right away if you are not doing well or get worse. Document Released: 01/05/2005 Document Revised: 05/22/2013 Document Reviewed: 03/27/2010 [facility] Patient Information 2015 [place], [place]. This information is not intended to replace advice given to you by your health care provider. Make sure you discuss any questions you have with your health care provider.

## 2014-09-28 ENCOUNTER — Ambulatory Visit (INDEPENDENT_AMBULATORY_CARE_PROVIDER_SITE_OTHER): Payer: BLUE CROSS/BLUE SHIELD | Admitting: Family Medicine

## 2014-09-28 ENCOUNTER — Ambulatory Visit (INDEPENDENT_AMBULATORY_CARE_PROVIDER_SITE_OTHER): Payer: BLUE CROSS/BLUE SHIELD

## 2014-09-28 VITALS — BP 128/76 | HR 75 | Temp 98.1°F | Resp 18 | Ht 65.0 in | Wt 166.0 lb

## 2014-09-28 DIAGNOSIS — Z86711 Personal history of pulmonary embolism: Secondary | ICD-10-CM

## 2014-09-28 DIAGNOSIS — R42 Dizziness and giddiness: Secondary | ICD-10-CM | POA: Diagnosis not present

## 2014-09-28 DIAGNOSIS — R0789 Other chest pain: Secondary | ICD-10-CM

## 2014-09-28 DIAGNOSIS — Z7901 Long term (current) use of anticoagulants: Secondary | ICD-10-CM

## 2014-09-28 LAB — POCT CBC
Granulocyte percent: 69.9 %G (ref 37–80)
HCT, POC: 49.9 % — AB (ref 37.7–47.9)
Hemoglobin: 15.5 g/dL (ref 12.2–16.2)
Lymph, poc: 1.9 (ref 0.6–3.4)
MCH, POC: 30.6 pg (ref 27–31.2)
MCHC: 31.1 g/dL — AB (ref 31.8–35.4)
MCV: 98.4 fL — AB (ref 80–97)
MID (cbc): 0.2 (ref 0–0.9)
MPV: 7.9 fL (ref 0–99.8)
POC Granulocyte: 4.9 (ref 2–6.9)
POC LYMPH PERCENT: 26.7 %L (ref 10–50)
POC MID %: 3.4 %M (ref 0–12)
Platelet Count, POC: 238 10*3/uL (ref 142–424)
RBC: 5.07 M/uL (ref 4.04–5.48)
RDW, POC: 15.5 %
WBC: 7 10*3/uL (ref 4.6–10.2)

## 2014-09-28 MED ORDER — TRAMADOL HCL 50 MG PO TABS: 50.0000 mg | ORAL_TABLET | Freq: Four times a day (QID) | ORAL | Status: DC | PRN

## 2014-09-28 NOTE — Progress Notes (Signed)
Pulmonary embolus Subjective:  Patient ID: Maureen [name], female    DOB: [birth year]  Age: 44 y.o. MRN: [id number]  Patient was hospitalized 10 days ago at [facility] for a pulmonary embolus. She continues to have pain in her chest anteriorly and in the back. It hurts him when she breathes deep. She still has some orthostatic dizziness, when she stands up she gets lightheaded and staggers. She is on xarelto for the blood thinner. She takes Prozac but no other psychiatric medications at this time.  Still takes atorvastatin and insulin and they put her on lansoprazole in the hospital. She continues taking the aspirin 81 mg.  She had been to [place] and back on a quick trip, 12 hours of driving each way and she sat as a passenger without moving. Objective:   Pleasant young lady in no major distress at this time when she stood up to get to the exam table she staggered because she does get orthostatic. Her neck is supple without nodes or thyromegaly. No carotid bruits. Chest clear to all station. Heart regular without murmurs. Abdomen soft and nontender. No thigh or calf tenderness. Negative Homans sign.  Assessment & Plan:   Assessment:  Status post pulmonary embolus Orthostatic dizziness Type 1 diabetes  Plan:  Chest x-ray and CBC Results for orders placed or performed in visit on 09/28/14  POCT CBC  Result Value Ref Range   WBC 7.0 4.6 - 10.2 K/uL   Lymph, poc 1.9 0.6 - 3.4   POC LYMPH PERCENT 26.7 10 - 50 %L   MID (cbc) 0.2 0 - 0.9   POC MID % 3.4 0 - 12 %M   POC Granulocyte 4.9 2 - 6.9   Granulocyte percent 69.9 37 - 80 %G   RBC 5.07 4.04 - 5.48 M/uL   Hemoglobin 15.5 12.2 - 16.2 g/dL   HCT, POC 49.9 (A) 37.7 - 47.9 %   MCV 98.4 (A) 80 - 97 fL   MCH, POC 30.6 27 - 31.2 pg   MCHC 31.1 (A) 31.8 - 35.4 g/dL   RDW, POC 15.5 %   Platelet Count, POC 238 142 - 424 K/uL   MPV 7.9 0 - 99.8 fL   [facility] reading (PRIMARY) by  Dr. Linna Darner Minimal streaky right lower lung,  possibly related to where she had a recent pulmonary embolus.  Tramadol for pain  She wants a referral for colonoscopy. I told her she can have one until she is off the blood thinner    Patient Instructions  Continue current medications  Return if worse at any time or go to emergency room  Return in 3 months     HOPPER,DAVID, MD 09/28/2014

## 2014-09-28 NOTE — Patient Instructions (Signed)
Continue current medications  Return if worse at any time or go to emergency room  Return in 3 months

## 2014-10-01 ENCOUNTER — Other Ambulatory Visit: Payer: Self-pay

## 2014-10-01 ENCOUNTER — Emergency Department (HOSPITAL_COMMUNITY): Payer: BLUE CROSS/BLUE SHIELD

## 2014-10-01 ENCOUNTER — Emergency Department (HOSPITAL_COMMUNITY)
Admission: EM | Admit: 2014-10-01 | Discharge: 2014-10-01 | Disposition: A | Discharge status: Home / Self Care | Payer: BLUE CROSS/BLUE SHIELD | Attending: Emergency Medicine | Admitting: Emergency Medicine

## 2014-10-01 ENCOUNTER — Encounter (HOSPITAL_COMMUNITY): Payer: Self-pay | Admitting: Emergency Medicine

## 2014-10-01 DIAGNOSIS — E1065 Type 1 diabetes mellitus with hyperglycemia: Secondary | ICD-10-CM | POA: Diagnosis not present

## 2014-10-01 DIAGNOSIS — M1990 Unspecified osteoarthritis, unspecified site: Secondary | ICD-10-CM | POA: Diagnosis not present

## 2014-10-01 DIAGNOSIS — E1044 Type 1 diabetes mellitus with diabetic amyotrophy: Secondary | ICD-10-CM | POA: Diagnosis not present

## 2014-10-01 DIAGNOSIS — Z87442 Personal history of urinary calculi: Secondary | ICD-10-CM | POA: Diagnosis not present

## 2014-10-01 DIAGNOSIS — F329 Major depressive disorder, single episode, unspecified: Secondary | ICD-10-CM | POA: Diagnosis not present

## 2014-10-01 DIAGNOSIS — Z7982 Long term (current) use of aspirin: Secondary | ICD-10-CM | POA: Insufficient documentation

## 2014-10-01 DIAGNOSIS — R079 Chest pain, unspecified: Secondary | ICD-10-CM | POA: Diagnosis present

## 2014-10-01 DIAGNOSIS — Z87828 Personal history of other (healed) physical injury and trauma: Secondary | ICD-10-CM | POA: Diagnosis not present

## 2014-10-01 DIAGNOSIS — Z8719 Personal history of other diseases of the digestive system: Secondary | ICD-10-CM | POA: Diagnosis not present

## 2014-10-01 DIAGNOSIS — F419 Anxiety disorder, unspecified: Secondary | ICD-10-CM | POA: Diagnosis not present

## 2014-10-01 DIAGNOSIS — R739 Hyperglycemia, unspecified: Secondary | ICD-10-CM

## 2014-10-01 DIAGNOSIS — Z794 Long term (current) use of insulin: Secondary | ICD-10-CM | POA: Diagnosis not present

## 2014-10-01 HISTORY — DX: Other pulmonary embolism without acute cor pulmonale: I26.99

## 2014-10-01 LAB — PROTIME-INR
INR: 1.56 — ABNORMAL HIGH (ref 0.00–1.49)
Prothrombin Time: 18.7 seconds — ABNORMAL HIGH (ref 11.6–15.2)

## 2014-10-01 LAB — CBC
HCT: 41 % (ref 36.0–46.0)
Hemoglobin: 13.9 g/dL (ref 12.0–15.0)
MCH: 32.5 pg (ref 26.0–34.0)
MCHC: 33.9 g/dL (ref 30.0–36.0)
MCV: 95.8 fL (ref 78.0–100.0)
Platelets: 348 10*3/uL (ref 150–400)
RBC: 4.28 MIL/uL (ref 3.87–5.11)
RDW: 12.9 % (ref 11.5–15.5)
WBC: 9.5 10*3/uL (ref 4.0–10.5)

## 2014-10-01 LAB — COMPREHENSIVE METABOLIC PANEL
ALT: 19 U/L (ref 14–54)
AST: 31 U/L (ref 15–41)
Albumin: 3.1 g/dL — ABNORMAL LOW (ref 3.5–5.0)
Alkaline Phosphatase: 153 U/L — ABNORMAL HIGH (ref 38–126)
Anion gap: 10 (ref 5–15)
BUN: 14 mg/dL (ref 6–20)
CO2: 29 mmol/L (ref 22–32)
Calcium: 9.1 mg/dL (ref 8.9–10.3)
Chloride: 93 mmol/L — ABNORMAL LOW (ref 101–111)
Creatinine, Ser: 1.17 mg/dL — ABNORMAL HIGH (ref 0.44–1.00)
GFR calc Af Amer: >60 mL/min (ref >60)
GFR calc non Af Amer: 57 mL/min — ABNORMAL LOW (ref >60)
Glucose, Bld: 538 mg/dL — ABNORMAL HIGH (ref 65–99)
Potassium: 4.6 mmol/L (ref 3.5–5.1)
Sodium: 132 mmol/L — ABNORMAL LOW (ref 135–145)
Total Bilirubin: 0.5 mg/dL (ref 0.3–1.2)
Total Protein: 6.8 g/dL (ref 6.5–8.1)

## 2014-10-01 LAB — URINE MICROSCOPIC-ADD ON

## 2014-10-01 LAB — I-STAT TROPONIN, ED
Troponin i, poc: 0 ng/mL (ref 0.00–0.08)
Troponin i, poc: 0.01 ng/mL (ref 0.00–0.08)

## 2014-10-01 LAB — URINALYSIS, ROUTINE W REFLEX MICROSCOPIC
Bilirubin Urine: NEGATIVE
Glucose, UA: >1000 mg/dL — AB
Hgb urine dipstick: NEGATIVE
Ketones, ur: NEGATIVE mg/dL
Leukocytes, UA: NEGATIVE
Nitrite: NEGATIVE
Protein, ur: NEGATIVE mg/dL
Specific Gravity, Urine: 1.028 (ref 1.005–1.030)
Urobilinogen, UA: 0.2 mg/dL (ref 0.0–1.0)
pH: 5.5 (ref 5.0–8.0)

## 2014-10-01 LAB — I-STAT CG4 LACTIC ACID, ED: Lactic Acid, Venous: 2.83 mmol/L (ref 0.5–2.0)

## 2014-10-01 LAB — CBG MONITORING, ED: Glucose-Capillary: 164 mg/dL — ABNORMAL HIGH (ref 65–99)

## 2014-10-01 MED ORDER — SODIUM CHLORIDE 0.9 % IV BOLUS (SEPSIS)
1000.0000 mL | Freq: Once | INTRAVENOUS | Status: AC
  Administered 2014-10-01: 1000 mL via INTRAVENOUS

## 2014-10-01 MED ORDER — INSULIN ASPART 100 UNIT/ML ~~LOC~~ SOLN
10.0000 [IU] | Freq: Once | SUBCUTANEOUS | Status: AC
  Administered 2014-10-01: 10 [IU] via SUBCUTANEOUS
  Filled 2014-10-01: qty 1

## 2014-10-01 MED ORDER — FENTANYL CITRATE (PF) 100 MCG/2ML IJ SOLN
50.0000 ug | Freq: Once | INTRAMUSCULAR | Status: AC
  Administered 2014-10-01: 50 ug via INTRAVENOUS
  Filled 2014-10-01: qty 2

## 2014-10-01 MED ORDER — HYDROCODONE-ACETAMINOPHEN 5-325 MG PO TABS: 1.0000 | ORAL_TABLET | ORAL | Status: DC | PRN

## 2014-10-01 MED ORDER — ASPIRIN 81 MG PO CHEW
324.0000 mg | CHEWABLE_TABLET | Freq: Once | ORAL | Status: AC
  Administered 2014-10-01: 324 mg via ORAL
  Filled 2014-10-01: qty 4

## 2014-10-01 MED ORDER — NITROGLYCERIN 0.4 MG SL SUBL
0.4000 mg | SUBLINGUAL_TABLET | SUBLINGUAL | Status: DC | PRN
  Administered 2014-10-01: 0.4 mg via SUBLINGUAL
  Filled 2014-10-01: qty 1

## 2014-10-01 NOTE — ED Notes (Signed)
Pt's CBG is 164 at this time.

## 2014-10-01 NOTE — ED Provider Notes (Addendum)
TIME SEEN: 12:49 AM   CHIEF COMPLAINT: Chest Pain  HPI: HPI Comments: Maureen [name] is a 44 y.o. 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 Pt reports having CP for the past 5 days but states that it was not severe enough to come to the ED. Pt describes the CP as a squeezing pain. She states that the pain does radiate to her left chest. Pt states that laying down provides slight relief. She states that exertion makes her SOB and this has been present for months. No significant change in this. Reports her chest pain is pleuritic. Pt reports nausea and persistent dizziness as well. Dizziness has been present for several weeks. Pt states that she is complaint with her Xarelto. Pt was recently admitted for a PE. Pt does report recently having a cough 1 week prior. Pt denies vomiting or diarrhea. Denies recent history of stress test.   Patient was diagnosed with a small right upper lobe ulnar embolus on August 15. Return to the emergency department on August 31 and had a repeat CT scan which showed her pulmonary embolus was gone. She did have bilateral posterior upper lobe opacities concerning for possible hemorrhage versus infection. Was discharged on Levaquin which she states she took all of.    ROS: See HPI Constitutional: no fever  Eyes: no drainage  ENT: no runny nose   Cardiovascular:   chest pain  Resp:  SOB  GI: no vomiting GU: no dysuria Integumentary: no rash  Allergy: no hives  Musculoskeletal: no leg swelling  Neurological: no slurred speech ROS otherwise negative  PAST MEDICAL HISTORY/PAST SURGICAL HISTORY:  Past Medical History  Diagnosis Date  . 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 Fibromyalgia   . Hyperlipidemia   . Abdominal pain   . Nausea & vomiting   .  Biliary dyskinesia   . Depression   . Joint pain   . Skin rash     due to medications  . Post traumatic stress disorder (PTSD)   . Sleep trouble   . Major depressive disorder   . Cervical strain   . Closed head injury 2010  . Renal stones   . Migraine     hx of none recent  . Arthritis   . Hypoglycemia   . Anxiety     MEDICATIONS:  Prior to Admission medications   Medication Sig Start Date End Date Taking? Authorizing Provider  aspirin EC 81 MG tablet Take 1 tablet (81 mg total) by mouth daily with breakfast. 10/16/13   Kerrie Buffalo, NP  clonazePAM (KLONOPIN) 2 MG tablet Take 1 tablet (2 mg total) by mouth at bedtime. Patient not taking: Reported on 09/28/2014 07/31/14 07/31/15  Leonides Grills, MD  dicyclomine (BENTYL) 20 MG tablet Take 1 tablet (20 mg total) by mouth 2 (two) times daily. Patient not taking: Reported on 09/28/2014 08/02/14   Elmyra Ricks Pisciotta, PA-C  FLUoxetine (PROZAC) 40 MG capsule Take 1 capsule (40 mg total) by mouth daily. For depression 05/29/14   Charlcie Cradle, MD  insulin aspart (NOVOLOG) 100 UNIT/ML injection Inject 8 Units into the skin 3 (three) times daily with meals. 10/16/13   Kerrie Buffalo, NP  insulin glargine (LANTUS) 100 UNIT/ML injection Inject 26 Units into the skin at bedtime.     Historical Provider, MD  levofloxacin (LEVAQUIN) 750 MG tablet Take 1 tablet (750 mg total) by mouth daily. 09/20/14   Varney Biles, MD  loperamide (IMODIUM) 2 MG capsule Take 1 capsule (2 mg total) by mouth 4 (four) times daily as needed for diarrhea or loose stools. 08/02/14   Nicole Pisciotta, PA-C  oxyCODONE-acetaminophen (PERCOCET/ROXICET) 5-325 MG per tablet 1 to 2 tabs PO q6hrs  PRN for pain Patient not taking: Reported on 09/28/2014 08/02/14   Elmyra Ricks Pisciotta, PA-C  pantoprazole (PROTONIX) 40 MG tablet Take 1 tablet (40 mg total) by mouth daily. 09/05/14   Geradine Girt, DO  Rivaroxaban (XARELTO STARTER PACK) 15 & 20 MG TBPK Take as directed on package: Start with  one 15mg  tablet by mouth twice a day with food. On Day 22, switch to one 20mg  tablet once a day with food. 09/05/14   Geradine Girt, DO  traMADol (ULTRAM) 50 MG tablet Take 1 tablet (50 mg total) by mouth every 6 (six) hours as needed for moderate pain. 09/28/14   Posey Boyer, MD    ALLERGIES:  Allergies  Allergen Reactions  . Cephalexin Rash    Pt was admitted to the hospital upon taking.  . Ibuprofen Nausea And Vomiting and Rash  . Meloxicam Nausea Only    SOCIAL HISTORY:  Social History  Substance Use Topics  . Smoking status: Never Smoker   . Smokeless tobacco: Never Used  . Alcohol Use: No    FAMILY HISTORY: Family History  Problem Relation Age of Onset  . Cancer Father     lymphoma  . Nephrolithiasis Father   . Vascular Disease Father   . Alcohol abuse Father   . Drug abuse Father   . Cancer Maternal Grandmother     colon  . Hypertension Mother   . Heart disease Mother   . Cataracts Mother   . Depression Mother   . Diabetes Brother   . Drug abuse Brother   . Mental illness Maternal Grandfather   . Cancer Maternal Grandfather   . Heart disease Paternal Grandmother   . Heart disease Paternal Grandfather   . Suicidality Maternal Uncle     EXAM: BP 136/74 mmHg  Pulse 84  Temp(Src) 97.2 F (36.2 C) (Oral)  Resp 16  SpO2 99%   CONSTITUTIONAL: Alert and oriented and responds appropriately to questions. Well-appearing; well-nourished HEAD: Normocephalic EYES: Conjunctivae clear, PERRL ENT: normal nose; no rhinorrhea; moist mucous membranes; pharynx without lesions noted NECK: Supple, no meningismus, no LAD  CARD: RRR; S1 and S2 appreciated; no murmurs, no clicks, no rubs, no gallops RESP: Normal chest excursion without splinting or tachypnea; breath sounds clear and equal bilaterally; no wheezes, no rhonchi, no rales, no hypoxia or respiratory distress, speaking full sentences CHEST:  Patient is tender to palpation over her left chest wall without crepitus,  ecchymosis or deformity ABD/GI: Normal bowel sounds; non-distended; soft, non-tender, no rebound, no guarding, no peritoneal signs BACK:  The back appears normal and is non-tender to palpation, there is no CVA tenderness EXT: Normal ROM in all joints; non-tender to palpation; no edema; normal capillary refill; no cyanosis, no calf tenderness or swelling    SKIN: Normal color for age and race; warm NEURO: Moves all extremities equally, sensation to light touch intact diffusely, cranial nerves II through XII intact PSYCH: The patient's mood and manner are appropriate. Grooming and personal hygiene are appropriate.  MEDICAL DECISION MAKING: Patient here with persistent episodes of chest pain after being diagnosed with a pulmonary embolus  on August 15. She is currently on Xarelto. She subsequently had a repeat CT scan on September 1 which showed her pulmonary blood's had resolved. That time it was thought she may have pneumonia versus small pulmonary hemorrhage. Was discharged on Levaquin. States the pain feels similar. She is still on her Xarelto. No fever or productive cough. She has a chronic leukocytosis. She does not have a cardiologist. I was suspicion that patient has a recurrent pulmonary embolus given last CT scan showed it had resolved while on Xarelto and she is still been taking this medication. She is not having any hemoptysis, tachycardia, hypoxia and her lungs are clear. Patient has had 5 CT scans in the past several months. Discussed with patient that I feel the risk of another CT scan may outweigh the benefit and she agrees. We'll start with checking labs and a chest x-ray and closely monitoring the patient. She does have a primary care doctor for follow-up.  ED PROGRESS: 3:10 AM  Pt's first troponin is negative. Chest x-ray clear. No improvement with nitroglycerin. We'll give fentanyl. Will repeat second troponin at 5am. Will give fentanyl for pain. Anticipate if the second troponin is  negative that we can discharge her home with close outpatient follow-up with her PCP as well as cardiology. Discussed with patient that she does have risk factors for ACS but has now been in the emergency department 3 different times in the past month for the same symptom with negative troponins. Have advised her to continue her Xarelto.  4:20 AM  Pt's blood glucose is elevated at 538 but she has a normal bicarbonate of 29 and anion gap of 10. Creatinine mildly elevated at 1.17. Will give IV fluids and subcutaneous insulin.  Her LFTs which were previously elevated have improved significantly.  6:00 AM  Pt's second troponin is negative. A lactate was erroneously run by mini lab on this patient and was found to be elevated. She is not having any infectious symptoms. This may be secondary to dehydration. Will give a second liter of IV fluids.   6:30 AM  Pt's blood glucose has improved to 164. She is still feeling better with no chest pain after one dose of fentanyl. No change with NTG.  Have advised her to continue her Xarelto and follow-up with her primary care physician as she may need a stress test.  At this time I do not feel she needs to be readmitted for a chest pain rule out given she has had similar symptoms for several weeks has had multiple negative troponins. We'll discharge with prescription for Vicodin for her atypical chest pain. Urine shows no sign infection. Chest x-ray shows no infiltrate. Discussed return precautions. She verbalized understanding and is comfortable with this plan.    EKG Interpretation  Date/Time:  Monday October 01 2014 00:48:57 EDT Ventricular Rate:  82 PR Interval:  124 QRS Duration: 76 QT Interval:  386 QTC Calculation: 450 R Axis:   78 Text Interpretation:  Normal sinus rhythm Normal ECG No significant change since last tracing Confirmed by Oley Lahaie,  DO, Adisynn Suleiman ([id number]) on 10/01/2014 12:58:52 AM         I personally performed the services described in this  documentation, which was scribed in my presence. The recorded information has been reviewed and is accurate.     [place], DO 10/01/14 [place], DO 10/01/14 [id number]

## 2014-10-01 NOTE — ED Notes (Signed)
Pt. reports central chest pain with SOB onset this evening , denies nausea or diaphoresis / no cough or congestion .

## 2014-10-01 NOTE — Discharge Instructions (Signed)
Chest Pain (Nonspecific) °It is often hard to give a specific diagnosis for the cause of chest pain. There is always a chance that your pain could be related to something serious, such as a heart attack or a blood clot in the lungs. You need to follow up with your health care provider for further evaluation. °CAUSES  °· Heartburn. °· Pneumonia or bronchitis. °· Anxiety or stress. °· Inflammation around your heart (pericarditis) or lung (pleuritis or pleurisy). °· A blood clot in the lung. °· A collapsed lung (pneumothorax). It can develop suddenly on its own (spontaneous pneumothorax) or from trauma to the chest. °· Shingles infection (herpes zoster virus). °The chest wall is composed of bones, muscles, and cartilage. Any of these can be the source of the pain. °· The bones can be bruised by injury. °· The muscles or cartilage can be strained by coughing or overwork. °· The cartilage can be affected by inflammation and become sore (costochondritis). °DIAGNOSIS  °Lab tests or other studies may be needed to find the cause of your pain. Your health care provider may have you take a test called an ambulatory electrocardiogram (ECG). An ECG records your heartbeat patterns over a 24-hour period. You may also have other tests, such as: °· Transthoracic echocardiogram (TTE). During echocardiography, sound waves are used to evaluate how blood flows through your heart. °· Transesophageal echocardiogram (TEE). °· Cardiac monitoring. This allows your health care provider to monitor your heart rate and rhythm in real time. °· Holter monitor. This is a portable device that records your heartbeat and can help diagnose heart arrhythmias. It allows your health care provider to track your heart activity for several days, if needed. °· Stress tests by exercise or by giving medicine that makes the heart beat faster. °TREATMENT  °· Treatment depends on what may be causing your chest pain. Treatment may include: °· Acid blockers for  heartburn. °· Anti-inflammatory medicine. °· Pain medicine for inflammatory conditions. °· Antibiotics if an infection is present. °· You may be advised to change lifestyle habits. This includes stopping smoking and avoiding alcohol, caffeine, and chocolate. °· You may be advised to keep your head raised (elevated) when sleeping. This reduces the chance of acid going backward from your stomach into your esophagus. °Most of the time, nonspecific chest pain will improve within 2-3 days with rest and mild pain medicine.  °HOME CARE INSTRUCTIONS  °· If antibiotics were prescribed, take them as directed. Finish them even if you start to feel better. °· For the next few days, avoid physical activities that bring on chest pain. Continue physical activities as directed. °· Do not use any tobacco products, including cigarettes, chewing tobacco, or electronic cigarettes. °· Avoid drinking alcohol. °· Only take medicine as directed by your health care provider. °· Follow your health care provider's suggestions for further testing if your chest pain does not go away. °· Keep any follow-up appointments you made. If you do not go to an appointment, you could develop lasting (chronic) problems with pain. If there is any problem keeping an appointment, call to reschedule. °SEEK MEDICAL CARE IF:  °· Your chest pain does not go away, even after treatment. °· You have a rash with blisters on your chest. °· You have a fever. °SEEK IMMEDIATE MEDICAL CARE IF:  °· You have increased chest pain or pain that spreads to your arm, neck, jaw, back, or abdomen. °· You have shortness of breath. °· You have an increasing cough, or you cough   up blood. °· You have severe back or abdominal pain. °· You feel nauseous or vomit. °· You have severe weakness. °· You faint. °· You have chills. °This is an emergency. Do not wait to see if the pain will go away. Get medical help at once. Call your local emergency services (911 in [place] Do not drive  yourself to the hospital. °MAKE SURE YOU:  °· Understand these instructions. °· Will watch your condition. °· Will get help right away if you are not doing well or get worse. °Document Released: 10/15/2004 Document Revised: 01/10/2013 Document Reviewed: 08/11/2007 °[employer]Care® Patient Information ©2015 [employer]. This information is not intended to replace advice given to you by your health care provider. Make sure you discuss any questions you have with your health care provider. ° °Hyperglycemia °Hyperglycemia occurs when the glucose (sugar) in your blood is too high. Hyperglycemia can happen for many reasons, but it most often happens to people who do not know they have diabetes or are not managing their diabetes properly.  °CAUSES  °Whether you have diabetes or not, there are other causes of hyperglycemia. Hyperglycemia can occur when you have diabetes, but it can also occur in other situations that you might not be as aware of, such as: °Diabetes °· If you have diabetes and are having problems controlling your blood glucose, hyperglycemia could occur because of some of the following reasons: °¨ Not following your meal plan. °¨ Not taking your diabetes medications or not taking it properly. °¨ Exercising less or doing less activity than you normally do. °¨ Being sick. °Pre-diabetes °· This cannot be ignored. Before people develop Type 2 diabetes, they almost always have "pre-diabetes." This is when your blood glucose levels are higher than normal, but not yet high enough to be diagnosed as diabetes. Research has shown that some long-term damage to the body, especially the heart and circulatory system, may already be occurring during pre-diabetes. If you take action to manage your blood glucose when you have pre-diabetes, you may delay or prevent Type 2 diabetes from developing. °Stress °· If you have diabetes, you may be "diet" controlled or on oral medications or insulin to control your diabetes. However, you  may find that your blood glucose is higher than usual in the hospital whether you have diabetes or not. This is often referred to as "stress hyperglycemia." Stress can elevate your blood glucose. This happens because of hormones put out by the body during times of stress. If stress has been the cause of your high blood glucose, it can be followed regularly by your caregiver. That way he/she can make sure your hyperglycemia does not continue to get worse or progress to diabetes. °Steroids °· Steroids are medications that act on the infection fighting system (immune system) to block inflammation or infection. One side effect can be a rise in blood glucose. Most people can produce enough extra insulin to allow for this rise, but for those who cannot, steroids make blood glucose levels go even higher. It is not unusual for steroid treatments to "uncover" diabetes that is developing. It is not always possible to determine if the hyperglycemia will go away after the steroids are stopped. A special blood test called an A1c is sometimes done to determine if your blood glucose was elevated before the steroids were started. °SYMPTOMS °· Thirsty. °· Frequent urination. °· Dry mouth. °· Blurred vision. °· Tired or fatigue. °· Weakness. °· Sleepy. °· Tingling in feet or leg. °DIAGNOSIS  °Diagnosis   is made by monitoring blood glucose in one or all of the following ways: °· A1c test. This is a chemical found in your blood. °· Fingerstick blood glucose monitoring. °· Laboratory results. °TREATMENT  °First, knowing the cause of the hyperglycemia is important before the hyperglycemia can be treated. Treatment may include, but is not be limited to: °· Education. °· Change or adjustment in medications. °· Change or adjustment in meal plan. °· Treatment for an illness, infection, etc. °· More frequent blood glucose monitoring. °· Change in exercise plan. °· Decreasing or stopping steroids. °· Lifestyle changes. °HOME CARE INSTRUCTIONS    °· Test your blood glucose as directed. °· Exercise regularly. Your caregiver will give you instructions about exercise. Pre-diabetes or diabetes which comes on with stress is helped by exercising. °· Eat wholesome, balanced meals. Eat often and at regular, fixed times. Your caregiver or nutritionist will give you a meal plan to guide your sugar intake. °· Being at an ideal weight is important. If needed, losing as little as 10 to 15 pounds may help improve blood glucose levels. °SEEK MEDICAL CARE IF:  °· You have questions about medicine, activity, or diet. °· You continue to have symptoms (problems such as increased thirst, urination, or weight gain). °SEEK IMMEDIATE MEDICAL CARE IF:  °· You are vomiting or have diarrhea. °· Your breath smells fruity. °· You are breathing faster or slower. °· You are very sleepy or incoherent. °· You have numbness, tingling, or pain in your feet or hands. °· You have chest pain. °· Your symptoms get worse even though you have been following your caregiver's orders. °· If you have any other questions or concerns. °Document Released: 07/01/2000 Document Revised: 03/30/2011 Document Reviewed: 05/04/2011 °ExitCare® Patient Information ©2015 [employer]. This information is not intended to replace advice given to you by your health care provider. Make sure you discuss any questions you have with your health care provider. ° °

## 2014-10-01 NOTE — ED Notes (Signed)
Pt left with all belongings and ambulated out of treatment area.  

## 2014-10-06 ENCOUNTER — Ambulatory Visit (INDEPENDENT_AMBULATORY_CARE_PROVIDER_SITE_OTHER): Payer: BLUE CROSS/BLUE SHIELD | Admitting: Family Medicine

## 2014-10-06 VITALS — BP 116/68 | HR 78 | Temp 98.5°F | Resp 18 | Ht 64.75 in | Wt 160.6 lb

## 2014-10-06 DIAGNOSIS — L299 Pruritus, unspecified: Secondary | ICD-10-CM | POA: Diagnosis not present

## 2014-10-06 DIAGNOSIS — Z7901 Long term (current) use of anticoagulants: Secondary | ICD-10-CM

## 2014-10-06 DIAGNOSIS — R109 Unspecified abdominal pain: Secondary | ICD-10-CM

## 2014-10-06 DIAGNOSIS — E119 Type 2 diabetes mellitus without complications: Secondary | ICD-10-CM | POA: Diagnosis not present

## 2014-10-06 DIAGNOSIS — Z86711 Personal history of pulmonary embolism: Secondary | ICD-10-CM | POA: Diagnosis not present

## 2014-10-06 DIAGNOSIS — F411 Generalized anxiety disorder: Secondary | ICD-10-CM

## 2014-10-06 DIAGNOSIS — R079 Chest pain, unspecified: Secondary | ICD-10-CM | POA: Diagnosis not present

## 2014-10-06 LAB — GLUCOSE, POCT (MANUAL RESULT ENTRY): POC Glucose: >=444 mg/dl (ref 70–99)

## 2014-10-06 LAB — POCT CBC
Granulocyte percent: 79.8 %G (ref 37–80)
HCT, POC: 44.6 % (ref 37.7–47.9)
Hemoglobin: 14.5 g/dL (ref 12.2–16.2)
Lymph, poc: 1.9 (ref 0.6–3.4)
MCH, POC: 30.7 pg (ref 27–31.2)
MCHC: 32.3 g/dL (ref 31.8–35.4)
MCV: 95 fL (ref 80–97)
MID (cbc): 0.9 (ref 0–0.9)
MPV: 8.4 fL (ref 0–99.8)
POC Granulocyte: 11 — AB (ref 2–6.9)
POC LYMPH PERCENT: 14 %L (ref 10–50)
POC MID %: 6.2 %M (ref 0–12)
Platelet Count, POC: 400 10*3/uL (ref 142–424)
RBC: 4.69 M/uL (ref 4.04–5.48)
RDW, POC: 13.9 %
WBC: 13.8 10*3/uL — AB (ref 4.6–10.2)

## 2014-10-06 MED ORDER — CLONAZEPAM 0.5 MG PO TABS: ORAL_TABLET | ORAL | Status: DC

## 2014-10-06 NOTE — Progress Notes (Signed)
Patient ID: Maureen [name], female    DOB: 09-10-1970  Age: 44 y.o. MRN: [id number]  Chief Complaint  Patient presents with  . Hospitalization Follow-up    For pulmonary embolism  . Medication Refill    Xarelto    Subjective:   Patient had the pulmonary embolus earlier this summer. She continues to be very anxious. She has chest pains all the time. She's been to the emergency room as well as coming here. Nothing else acute is felt been found. She has some mild stomach distress comfort. She itches, but she was doing that before this. She is on Prozac 40 mg daily. She does not smoke. She is diabetic and takes her medicines faithfully. She goes to an endocrinologist.  Current allergies, medications, problem list, past/family and social histories reviewed.  Objective:  BP 116/68 mmHg  Pulse 78  Temp(Src) 98.5 F (36.9 C) (Oral)  Resp 18  Ht 5' 4.75" (1.645 m)  Wt 160 lb 9.6 oz (72.848 kg)  BMI 26.92 kg/m2  SpO2 98%  No major acute distress. Has some excoriations on her legs from scratching. Her throat is clear. Neck supple without nodes or thyromegaly. Chest clear to auscultation. Heart regular without murmurs. Abdomen soft without mass or tenderness. Extremities without edema.  Assessment & Plan:   Assessment: 1. Chest pain, unspecified chest pain type   2. Generalized anxiety disorder   3. Type 2 diabetes mellitus without complication   4. Abdominal pain, unspecified abdominal location   5. History of pulmonary embolism   6. Long term current use of anticoagulant therapy   7. Itching       Plan: Orders Placed This Encounter  Procedures  . POCT glucose (manual entry)  . POCT CBC    Meds ordered this encounter  Medications  . DISCONTD: omeprazole (PRILOSEC) 20 MG capsule    Sig: Take 20 mg by mouth daily.  Marland Kitchen DISCONTD: metFORMIN (GLUCOPHAGE) 500 MG tablet    Sig: Take by mouth 2 (two) times daily with a meal.  . DISCONTD: atorvastatin (LIPITOR) 40 MG tablet   Sig: Take 40 mg by mouth daily.  Marland Kitchen DISCONTD: amLODipine (NORVASC) 5 MG tablet    Sig: Take 5 mg by mouth daily.  Marland Kitchen DISCONTD: travoprost, benzalkonium, (TRAVATAN) 0.004 % ophthalmic solution    Sig: 1 drop at bedtime.  Marland Kitchen DISCONTD: omeprazole (PRILOSEC) 10 MG capsule    Sig: Take 20 mg by mouth daily.  . clonazePAM (KLONOPIN) 0.5 MG tablet    Sig: Take one twice daily as needed for anxiety and itching    Dispense:  30 tablet    Refill:  1     I believe her symptoms are primarily anxiety related. Will check a couple basic labs but probably need to just treat her more from anxiety. She cannot take any NSAIDs for the chronic chest wall pain.    Patient Instructions  Take Tylenol 2 pills 2 or 3 times daily on a regular basis for the next couple weeks and see if we can get your chest pains to calm down.  Take clonazepam 0.5 mg 1 twice daily in the morning and evening to help you anxiety  Return in one month, sooner if needed.  Follow-up with her diabetic doctor soon.. Your sugar is very very high today. He need to be very careful and watch your diet. If you can't get she sugars coming down you need to either see Dr. Chalmers Cater or go to the emergency room.  Results for orders placed or performed in visit on 10/06/14  POCT glucose (manual entry)  Result Value Ref Range   POC Glucose >=444 70 - 99 mg/dl  POCT CBC  Result Value Ref Range   WBC 13.8 (A) 4.6 - 10.2 K/uL   Lymph, poc 1.9 0.6 - 3.4   POC LYMPH PERCENT 14.0 10 - 50 %L   MID (cbc) 0.9 0 - 0.9   POC MID % 6.2 0 - 12 %M   POC Granulocyte 11.0 (A) 2 - 6.9   Granulocyte percent 79.8 37 - 80 %G   RBC 4.69 4.04 - 5.48 M/uL   Hemoglobin 14.5 12.2 - 16.2 g/dL   HCT, POC 44.6 37.7 - 47.9 %   MCV 95.0 80 - 97 fL   MCH, POC 30.7 27 - 31.2 pg   MCHC 32.3 31.8 - 35.4 g/dL   RDW, POC 13.9 %   Platelet Count, POC 400.0 142 - 424 K/uL   MPV 8.4 0 - 99.8 fL     Return in about 1 month (around 11/05/2014).   HOPPER,DAVID, MD  10/06/2014

## 2014-10-06 NOTE — Patient Instructions (Addendum)
Take Tylenol 2 pills 2 or 3 times daily on a regular basis for the next couple weeks and see if we can get your chest pains to calm down.  Take clonazepam 0.5 mg 1 twice daily in the morning and evening to help you anxiety  Return in one month, sooner if needed.  Follow-up with her diabetic doctor soon.. Your sugar is very very high today. He need to be very careful and watch your diet. If you can't get she sugars coming down you need to either see Dr. Chalmers Cater or go to the emergency room.

## 2014-10-11 ENCOUNTER — Emergency Department (HOSPITAL_COMMUNITY): Payer: BLUE CROSS/BLUE SHIELD

## 2014-10-11 ENCOUNTER — Encounter (HOSPITAL_COMMUNITY): Payer: Self-pay | Admitting: Emergency Medicine

## 2014-10-11 ENCOUNTER — Emergency Department (HOSPITAL_COMMUNITY)
Admission: EM | Admit: 2014-10-11 | Discharge: 2014-10-12 | Disposition: A | Discharge status: Home / Self Care | Payer: BLUE CROSS/BLUE SHIELD | Attending: Emergency Medicine | Admitting: Emergency Medicine

## 2014-10-11 DIAGNOSIS — M797 Fibromyalgia: Secondary | ICD-10-CM | POA: Insufficient documentation

## 2014-10-11 DIAGNOSIS — J189 Pneumonia, unspecified organism: Secondary | ICD-10-CM | POA: Diagnosis not present

## 2014-10-11 DIAGNOSIS — Z86711 Personal history of pulmonary embolism: Secondary | ICD-10-CM | POA: Insufficient documentation

## 2014-10-11 DIAGNOSIS — R410 Disorientation, unspecified: Secondary | ICD-10-CM | POA: Diagnosis not present

## 2014-10-11 DIAGNOSIS — Z7982 Long term (current) use of aspirin: Secondary | ICD-10-CM | POA: Insufficient documentation

## 2014-10-11 DIAGNOSIS — E1021 Type 1 diabetes mellitus with diabetic nephropathy: Secondary | ICD-10-CM | POA: Diagnosis not present

## 2014-10-11 DIAGNOSIS — Z87442 Personal history of urinary calculi: Secondary | ICD-10-CM | POA: Diagnosis not present

## 2014-10-11 DIAGNOSIS — Z87828 Personal history of other (healed) physical injury and trauma: Secondary | ICD-10-CM | POA: Insufficient documentation

## 2014-10-11 DIAGNOSIS — G43909 Migraine, unspecified, not intractable, without status migrainosus: Secondary | ICD-10-CM | POA: Diagnosis not present

## 2014-10-11 DIAGNOSIS — R4182 Altered mental status, unspecified: Secondary | ICD-10-CM | POA: Diagnosis present

## 2014-10-11 DIAGNOSIS — Z794 Long term (current) use of insulin: Secondary | ICD-10-CM | POA: Diagnosis not present

## 2014-10-11 DIAGNOSIS — Z8719 Personal history of other diseases of the digestive system: Secondary | ICD-10-CM | POA: Diagnosis not present

## 2014-10-11 DIAGNOSIS — R531 Weakness: Secondary | ICD-10-CM | POA: Insufficient documentation

## 2014-10-11 DIAGNOSIS — R05 Cough: Secondary | ICD-10-CM | POA: Diagnosis not present

## 2014-10-11 DIAGNOSIS — Z79899 Other long term (current) drug therapy: Secondary | ICD-10-CM | POA: Insufficient documentation

## 2014-10-11 DIAGNOSIS — M1990 Unspecified osteoarthritis, unspecified site: Secondary | ICD-10-CM | POA: Insufficient documentation

## 2014-10-11 LAB — COMPREHENSIVE METABOLIC PANEL
ALT: 19 U/L (ref 14–54)
AST: 40 U/L (ref 15–41)
Albumin: 3.1 g/dL — ABNORMAL LOW (ref 3.5–5.0)
Alkaline Phosphatase: 139 U/L — ABNORMAL HIGH (ref 38–126)
Anion gap: 8 (ref 5–15)
BUN: 6 mg/dL (ref 6–20)
CO2: 32 mmol/L (ref 22–32)
Calcium: 9 mg/dL (ref 8.9–10.3)
Chloride: 96 mmol/L — ABNORMAL LOW (ref 101–111)
Creatinine, Ser: 0.98 mg/dL (ref 0.44–1.00)
GFR calc Af Amer: >60 mL/min (ref >60)
GFR calc non Af Amer: >60 mL/min (ref >60)
Glucose, Bld: 215 mg/dL — ABNORMAL HIGH (ref 65–99)
Potassium: 3.2 mmol/L — ABNORMAL LOW (ref 3.5–5.1)
Sodium: 136 mmol/L (ref 135–145)
Total Bilirubin: 0.7 mg/dL (ref 0.3–1.2)
Total Protein: 6.5 g/dL (ref 6.5–8.1)

## 2014-10-11 LAB — CBC WITH DIFFERENTIAL/PLATELET
Basophils Absolute: 0 10*3/uL (ref 0.0–0.1)
Basophils Relative: 0 %
Eosinophils Absolute: 0.1 10*3/uL (ref 0.0–0.7)
Eosinophils Relative: 1 %
HCT: 35.2 % — ABNORMAL LOW (ref 36.0–46.0)
Hemoglobin: 11.9 g/dL — ABNORMAL LOW (ref 12.0–15.0)
Lymphocytes Relative: 8 %
Lymphs Abs: 1.2 10*3/uL (ref 0.7–4.0)
MCH: 32.4 pg (ref 26.0–34.0)
MCHC: 33.8 g/dL (ref 30.0–36.0)
MCV: 95.9 fL (ref 78.0–100.0)
Monocytes Absolute: 1.1 10*3/uL — ABNORMAL HIGH (ref 0.1–1.0)
Monocytes Relative: 8 %
Neutro Abs: 12.5 10*3/uL — ABNORMAL HIGH (ref 1.7–7.7)
Neutrophils Relative %: 83 %
Platelets: 234 10*3/uL (ref 150–400)
RBC: 3.67 MIL/uL — ABNORMAL LOW (ref 3.87–5.11)
RDW: 12.8 % (ref 11.5–15.5)
WBC: 14.9 10*3/uL — ABNORMAL HIGH (ref 4.0–10.5)

## 2014-10-11 LAB — I-STAT TROPONIN, ED: Troponin i, poc: 0 ng/mL (ref 0.00–0.08)

## 2014-10-11 LAB — URINALYSIS, ROUTINE W REFLEX MICROSCOPIC
Bilirubin Urine: NEGATIVE
Glucose, UA: 500 mg/dL — AB
Ketones, ur: NEGATIVE mg/dL
Leukocytes, UA: NEGATIVE
Nitrite: NEGATIVE
Protein, ur: NEGATIVE mg/dL
Specific Gravity, Urine: 1.01 (ref 1.005–1.030)
Urobilinogen, UA: 0.2 mg/dL (ref 0.0–1.0)
pH: 5.5 (ref 5.0–8.0)

## 2014-10-11 LAB — PROTIME-INR
INR: 1.86 — ABNORMAL HIGH (ref 0.00–1.49)
Prothrombin Time: 21.4 seconds — ABNORMAL HIGH (ref 11.6–15.2)

## 2014-10-11 LAB — RAPID URINE DRUG SCREEN, HOSP PERFORMED
Amphetamines: NOT DETECTED
Barbiturates: NOT DETECTED
Benzodiazepines: POSITIVE — AB
Cocaine: NOT DETECTED
Opiates: POSITIVE — AB
Tetrahydrocannabinol: NOT DETECTED

## 2014-10-11 LAB — URINE MICROSCOPIC-ADD ON

## 2014-10-11 LAB — I-STAT CG4 LACTIC ACID, ED: Lactic Acid, Venous: 2.38 mmol/L (ref 0.5–2.0)

## 2014-10-11 MED ORDER — ACETAMINOPHEN 500 MG PO TABS
1000.0000 mg | ORAL_TABLET | Freq: Once | ORAL | Status: AC
Start: 2014-10-11 — End: 2014-10-11
  Administered 2014-10-11: 1000 mg via ORAL
  Filled 2014-10-11: qty 2

## 2014-10-11 MED ORDER — LEVOFLOXACIN IN D5W 750 MG/150ML IV SOLN
750.0000 mg | Freq: Once | INTRAVENOUS | Status: AC
  Administered 2014-10-12: 750 mg via INTRAVENOUS
  Filled 2014-10-11: qty 150

## 2014-10-11 MED ORDER — SODIUM CHLORIDE 0.9 % IV BOLUS (SEPSIS)
1000.0000 mL | Freq: Once | INTRAVENOUS | Status: AC
Start: 2014-10-11 — End: 2014-10-12
  Administered 2014-10-11: 1000 mL via INTRAVENOUS

## 2014-10-11 NOTE — ED Provider Notes (Signed)
CSN: [id number]     Arrival date & time 10/11/14  2039 History   First MD Initiated Contact with Patient 10/11/14 2108     Chief Complaint  Patient presents with  . Altered Mental Status     (Consider location/radiation/quality/duration/timing/severity/associated sxs/prior Treatment) The history is provided by the patient.  Maureen [name] is a 44 y.o. female hx of DM, hyperlipidemia, PTSD, PE on Xarelto here presenting with altered mental status, cough. Patient has been coughing for the last several days but does have chronic shortness of breath which her doctor thought was from anxiety. He has been taking her Xarelto as prescribed but did miss about 2 doses and had CT angio a month ago that did not show any recurrent PE but does have a pneumonia and finished a course of levaquin. Patient states that she took her oxycodone and clonopin today around 3pm and then novolog 10 Units at 6 pm then missed dinner. Around 7 pm, she felt weak and thought that she was hypoglycemic but didn't take her blood sugar. She ate something and then called EMS. EMS noted blood sugar 208. She was noted to be altered and given 1 mg of narcan and then patient became awake and alert. She denies IV drug use or taking more of her meds. Patient follows up with a pain doctor. Also has productive cough and chills. Denies vomiting, has chronic abdominal pain that was thought to be from previous scarring.     Past Medical History  Diagnosis Date  . 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 Fibromyalgia   . Hyperlipidemia   . Abdominal pain   . Nausea & vomiting   . Biliary dyskinesia   . Depression   . Joint pain   . Skin rash     due to medications  . Post traumatic stress disorder (PTSD)   . Sleep trouble   . Major depressive disorder   . Cervical strain   . Closed head injury 2010  . Renal stones   . Migraine     hx of none recent  . Arthritis   . Hypoglycemia   . Anxiety   .  Pulmonary embolism    Past Surgical History  Procedure Laterality Date  . Lipoma removed  yrs ago  . Knee surgery Left 2012  . Cesarean section  1997, 1999  . Cholecystectomy  02/10/2011    Procedure: LAPAROSCOPIC CHOLECYSTECTOMY WITH INTRAOPERATIVE CHOLANGIOGRAM;  Surgeon: Judieth Keens, DO;  Location: [facility];  Service: General;  Laterality: N/A;  . Robotic assisted laparoscopic lysis of adhesion N/A 03/04/2012    Procedure: ROBOTIC ASSISTED LAPAROSCOPIC LYSIS OF EXTENSIVE ADHESIONS;  Surgeon: Claiborne Billings A. Pamala Hurry, MD;  Location: [facility];  Service: Gynecology;  Laterality: N/A;  . Tonsillectomy  2001  . Abdominal hysterectomy  2001  . Eye surgery  2006    laser eye surgery left eye  . Exploratory laparomty  Mar 04, 2012  . Laparoscopic appendectomy N/A 10/12/2012    Procedure: DIAGNOSTIC APPENDECTOMY LAPAROSCOPIC;  Surgeon: Madilyn Hook, DO;  Location: [facility];  Service: General;  Laterality: N/A;  . Appendectomy     Family History  Problem Relation Age of Onset  . Cancer Father     lymphoma  . Nephrolithiasis Father   . Vascular Disease Father   . Alcohol abuse Father   . Drug abuse Father   . Cancer Maternal Grandmother     colon  . Hypertension Mother   .  Heart disease Mother   . Cataracts Mother   . Depression Mother   . Diabetes Brother   . Drug abuse Brother   . Mental illness Maternal Grandfather   . Cancer Maternal Grandfather   . Heart disease Paternal Grandmother   . Heart disease Paternal Grandfather   . Suicidality Maternal Uncle    Social History  Substance Use Topics  . Smoking status: Never Smoker   . Smokeless tobacco: Never Used  . Alcohol Use: No   OB History    No data available     Review of Systems  Respiratory: Positive for cough.   Neurological: Positive for weakness.  All other systems reviewed and are negative.     Allergies  Cephalexin; Ibuprofen; and Meloxicam  Home Medications   Prior to Admission medications   Medication Sig  Start Date End Date Taking? Authorizing Provider  aspirin EC 81 MG tablet Take 1 tablet (81 mg total) by mouth daily with breakfast. 10/16/13  Yes Kerrie Buffalo, NP  clonazePAM Bobbye [place]) 0.5 MG tablet Take one twice daily as needed for anxiety and itching 10/06/14  Yes Posey Boyer, MD  FLUoxetine (PROZAC) 40 MG capsule Take 1 capsule (40 mg total) by mouth daily. For depression 05/29/14  Yes Charlcie Cradle, MD  HYDROcodone-acetaminophen (NORCO/VICODIN) 5-325 MG per tablet Take 1 tablet by mouth every 4 (four) hours as needed. Patient taking differently: Take 1 tablet by mouth every 4 (four) hours as needed for moderate pain.  10/01/14  Yes Kristen N Ward, DO  insulin aspart (NOVOLOG) 100 UNIT/ML injection Inject 8 Units into the skin 3 (three) times daily with meals. 10/16/13  Yes Kerrie Buffalo, NP  insulin glargine (LANTUS) 100 UNIT/ML injection Inject 26 Units into the skin at bedtime.    Yes Historical Provider, MD  pantoprazole (PROTONIX) 40 MG tablet Take 1 tablet (40 mg total) by mouth daily. 09/05/14  Yes Geradine Girt, DO  rivaroxaban (XARELTO) 20 MG TABS tablet Take 20 mg by mouth daily with supper.   Yes Historical Provider, MD  Rivaroxaban (XARELTO STARTER PACK) 15 & 20 MG TBPK Take as directed on package: Start with one 15mg  tablet by mouth twice a day with food. On Day 22, switch to one 20mg  tablet once a day with food. Patient not taking: Reported on 10/11/2014 09/05/14   Geradine Girt, DO  traMADol (ULTRAM) 50 MG tablet Take 1 tablet (50 mg total) by mouth every 6 (six) hours as needed for moderate pain. Patient not taking: Reported on 10/06/2014 09/28/14   Posey Boyer, MD   BP 122/56 mmHg  Pulse 95  Temp(Src) 98.7 F (37.1 C) (Oral)  Resp 8  Ht 5\' 4"  (1.626 m)  Wt 160 lb (72.576 kg)  BMI 27.45 kg/m2  SpO2 96% Physical Exam  Constitutional: She is oriented to person, place, and time. She appears well-developed and well-nourished.  Alert, awake   HENT:  Head: Normocephalic.   MM slightly dry   Eyes: Conjunctivae are normal. Pupils are equal, round, and reactive to light.  Neck: Normal range of motion. Neck supple.  Cardiovascular: Normal rate, regular rhythm and normal heart sounds.   Pulmonary/Chest: Effort normal.  Diminished bilateral bases   Abdominal: Soft. Bowel sounds are normal. She exhibits no distension. There is no tenderness. There is no rebound.  Musculoskeletal: Normal range of motion. She exhibits no edema or tenderness.  Neurological: She is alert and oriented to person, place, and time. No cranial nerve deficit. Coordination  normal.  Skin: Skin is warm and dry.  No obvious tract marks   Psychiatric: She has a normal mood and affect. Her behavior is normal. Judgment and thought content normal.  Nursing note and vitals reviewed.   ED Course  Procedures (including critical care time) Labs Review Labs Reviewed  CBC WITH DIFFERENTIAL/PLATELET - Abnormal; Notable for the following:    WBC 14.9 (*)    RBC 3.67 (*)    Hemoglobin 11.9 (*)    HCT 35.2 (*)    Neutro Abs 12.5 (*)    Monocytes Absolute 1.1 (*)    All other components within normal limits  COMPREHENSIVE METABOLIC PANEL - Abnormal; Notable for the following:    Potassium 3.2 (*)    Chloride 96 (*)    Glucose, Bld 215 (*)    Albumin 3.1 (*)    Alkaline Phosphatase 139 (*)    All other components within normal limits  PROTIME-INR - Abnormal; Notable for the following:    Prothrombin Time 21.4 (*)    INR 1.86 (*)    All other components within normal limits  URINALYSIS, ROUTINE W REFLEX MICROSCOPIC (NOT AT [facility]) - Abnormal; Notable for the following:    Glucose, UA 500 (*)    Hgb urine dipstick TRACE (*)    All other components within normal limits  URINE RAPID DRUG SCREEN, HOSP PERFORMED - Abnormal; Notable for the following:    Opiates POSITIVE (*)    Benzodiazepines POSITIVE (*)    All other components within normal limits  I-STAT CG4 LACTIC ACID, ED - Abnormal; Notable  for the following:    Lactic Acid, Venous 2.38 (*)    All other components within normal limits  CULTURE, BLOOD (ROUTINE X 2)  CULTURE, BLOOD (ROUTINE X 2)  URINE MICROSCOPIC-ADD ON  ETHANOL  SALICYLATE LEVEL  ACETAMINOPHEN LEVEL  I-STAT TROPOININ, ED    Imaging Review Dg Chest 2 View  10/11/2014   CLINICAL DATA:  Chest pain and shortness of breath for 1 day. Recent history of a pulmonary embolism.  EXAM: CHEST  2 VIEW  COMPARISON:  10/01/2014  FINDINGS: There is patchy consolidation in the posterior right upper lobe, new from the prior exam. Remainder of the lungs is clear. No pleural effusion or pneumothorax.  Normal heart, mediastinum and hila. Skeletal structures are unremarkable.  IMPRESSION: 1. Right upper lobe consolidation consistent with pneumonia in the proper clinical setting.   Electronically Signed   By: Lajean Manes M.D.   On: 10/11/2014 21:58   I have personally reviewed and evaluated these images and lab results as part of my medical decision-making.   EKG Interpretation   Date/Time:  Thursday October 11 2014 20:45:48 EDT Ventricular Rate:  129 PR Interval:  135 QRS Duration: 83 QT Interval:  281 QTC Calculation: 412 R Axis:   110 Text Interpretation:  Sinus tachycardia Right axis deviation Borderline  repolarization abnormality tachycardia new since previous Confirmed by YAO   MD, DAVID ([id number]) on 10/11/2014 9:11:29 PM      MDM   Final diagnoses:  None   Maureen [name] is a 44 y.o. female here with AMS, fever, cough. She is on chronic pain meds but adamantly denies taking more of meds or doing IV drugs. Will do sepsis workup and also get tox levels. Tachycardic, will hydrate as well.   12:03 AM CXR showed pneumonia. Tachycardia improved, never hypoxic or hypotensive. WBC 15, lactate mildly elevated 2.4. UA nl. UDS + opiate and benzos that  she is prescribed. Alert and awake, not requiring narcan. I think AMS likely multifactorial. Likely from taking  pain meds and using insulin with no food and pneumonia. Given levaquin, stable for dc.     Wandra Arthurs, MD 10/12/14 Adelfa Koh

## 2014-10-11 NOTE — ED Notes (Signed)
Per EMS, called out to patient's house for possible diabetic call. Pt's blood sugar 208 on scene. Empty hydrocodone bottle found next to patient. EMS gave 1mg  of Narcan, and patient responded immediately. Pt's breathing, vitals improved enroute to hospital. Pt admitted to hydrocodone and clonopin use today.

## 2014-10-12 LAB — ETHANOL: Alcohol, Ethyl (B): <5 mg/dL (ref <5)

## 2014-10-12 LAB — ACETAMINOPHEN LEVEL: Acetaminophen (Tylenol), Serum: 21 ug/mL (ref 10–30)

## 2014-10-12 LAB — SALICYLATE LEVEL: Salicylate Lvl: <4 mg/dL (ref 2.8–30.0)

## 2014-10-12 MED ORDER — POTASSIUM CHLORIDE CRYS ER 20 MEQ PO TBCR
40.0000 meq | EXTENDED_RELEASE_TABLET | Freq: Once | ORAL | Status: AC
  Administered 2014-10-12: 40 meq via ORAL
  Filled 2014-10-12: qty 2

## 2014-10-12 MED ORDER — LEVOFLOXACIN 750 MG PO TABS: 750.0000 mg | ORAL_TABLET | Freq: Every day | ORAL | Status: DC

## 2014-10-12 NOTE — Discharge Instructions (Signed)
Stay hydrated.   Avoid taking pain meds and clonopin if possible.   Check blood sugar often.  Take levaquin daily for a week.   See your doctor in a week.   Return to ER if you have trouble breathing, fever for a week, vomiting, dehydration, passing out.

## 2014-10-12 NOTE — ED Notes (Signed)
Pt left with belongings and ambulated out of treatment area.

## 2014-10-15 IMAGING — US US RENAL
1 series · 14 of 25 positions shown · non-contrast
Comparison: CT 09/13/2012.

CLINICAL DATA: Right flank pain.  History of stones.

EXAM:
RENAL/URINARY TRACT ULTRASOUND COMPLETE

[Series 1: us renal · 0.24mm/px · 14 of 40 slices shown]
[im 1/40]
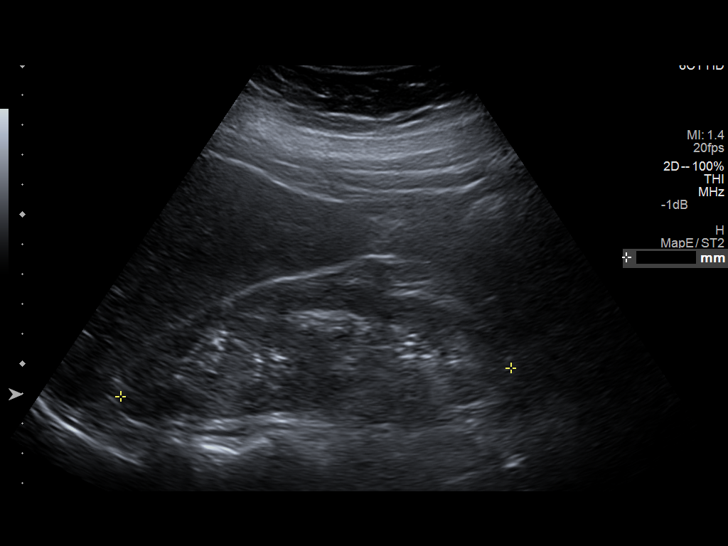
[im 4/40]
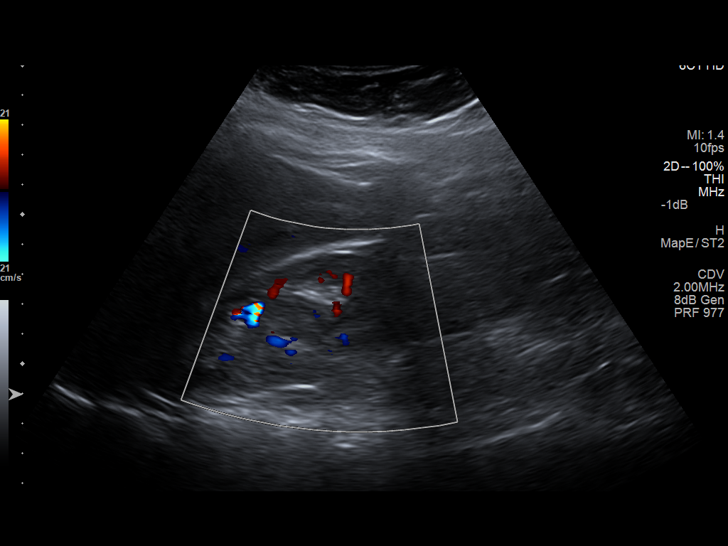
[im 7/40]
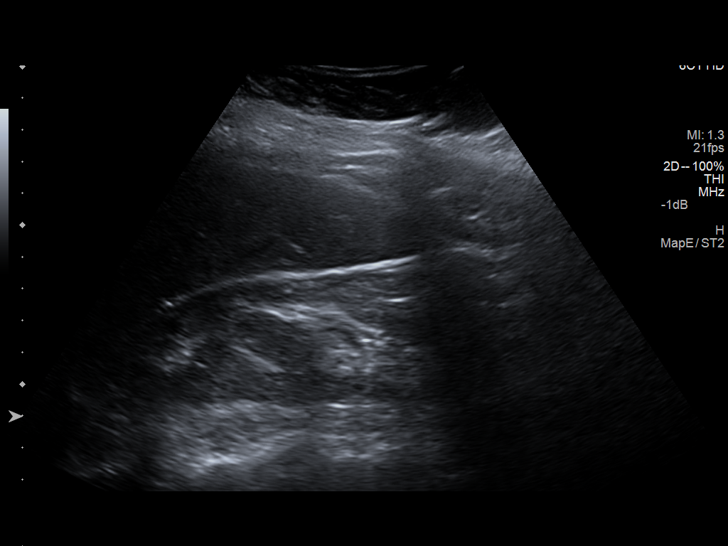
[im 10/40]
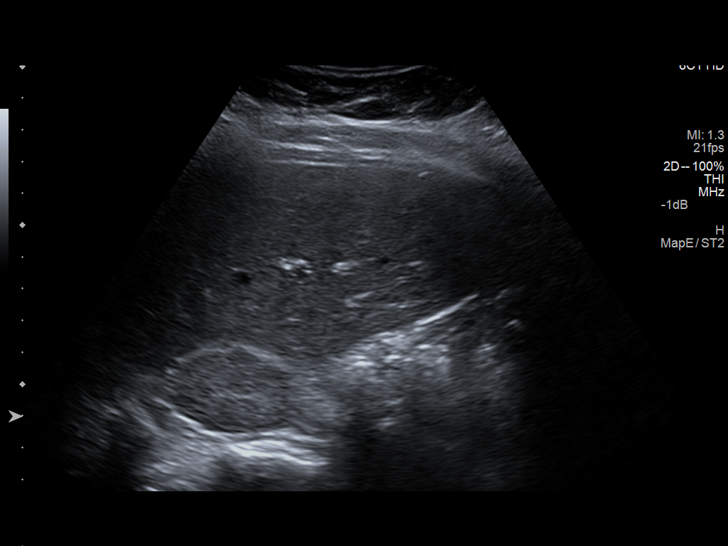
[im 14/40]
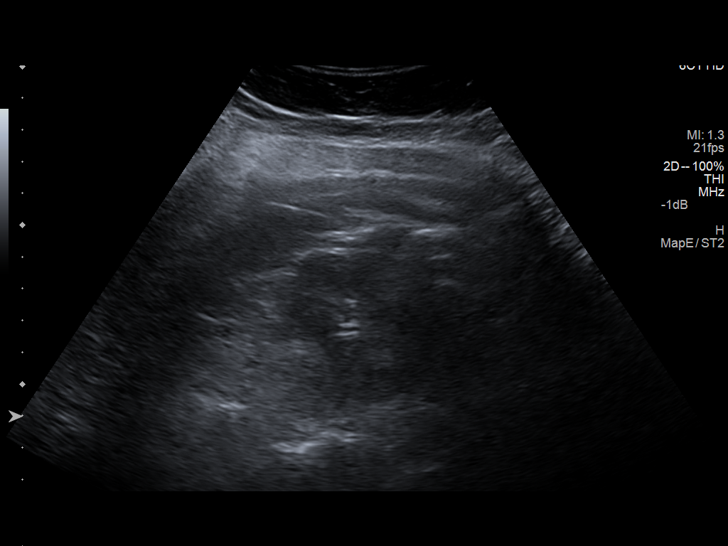
[im 15/40]
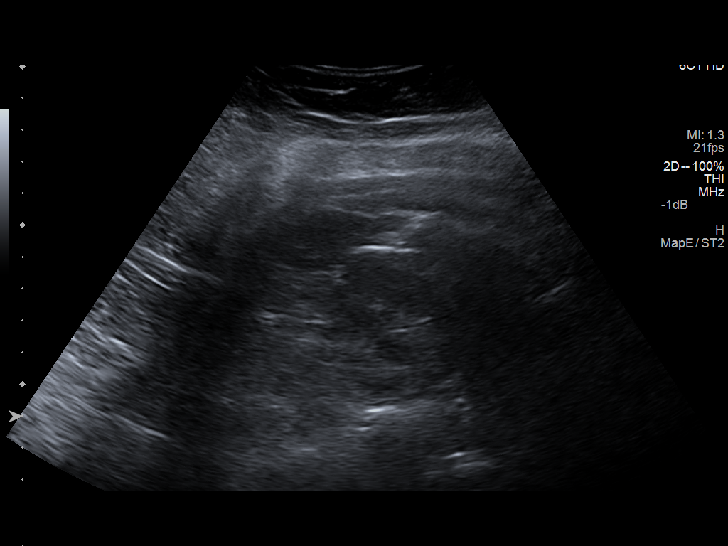
[im 18/40]
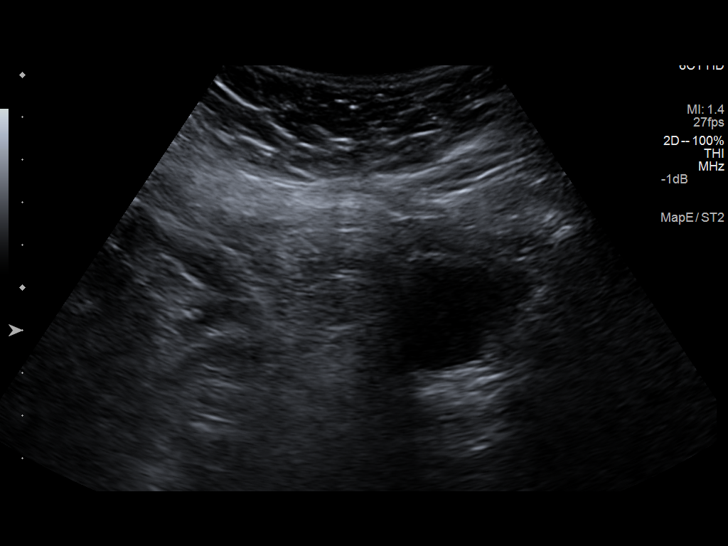
[im 22/40]
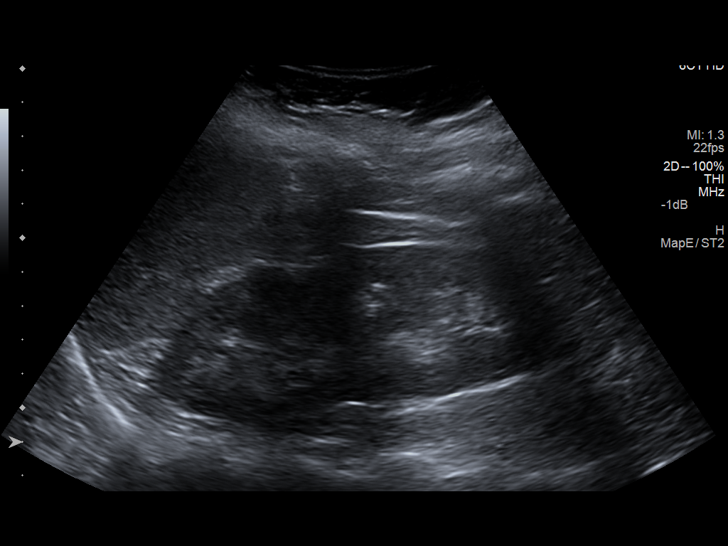
[im 25/40]
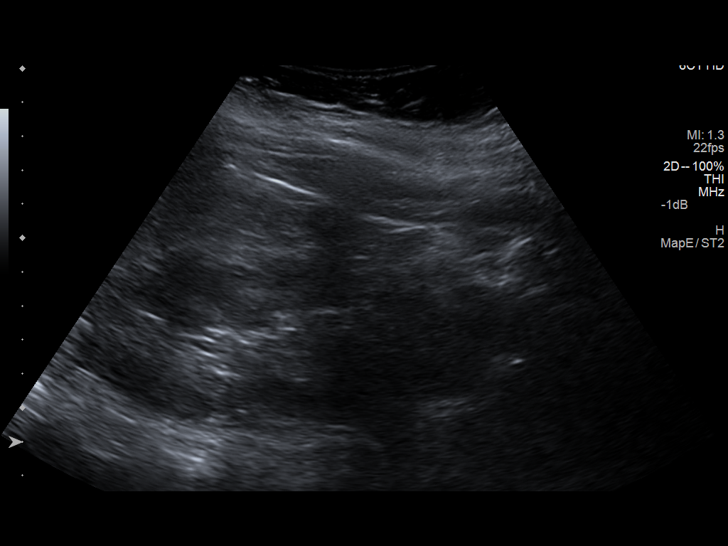
[im 27/40]
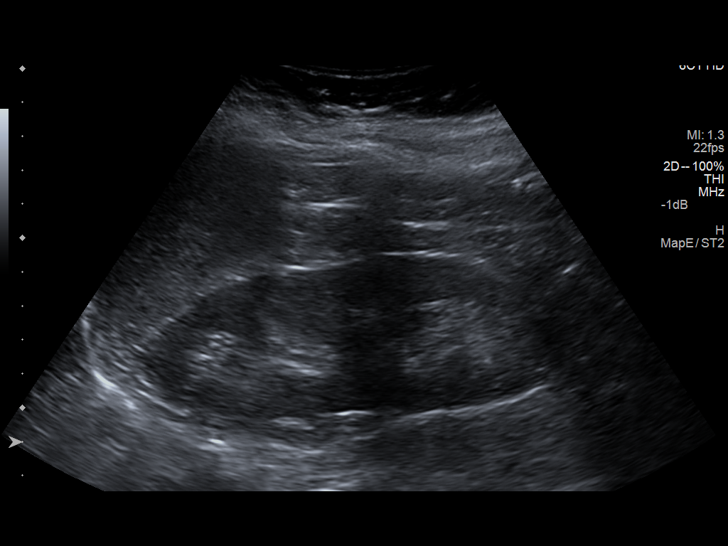
[im 30/40]
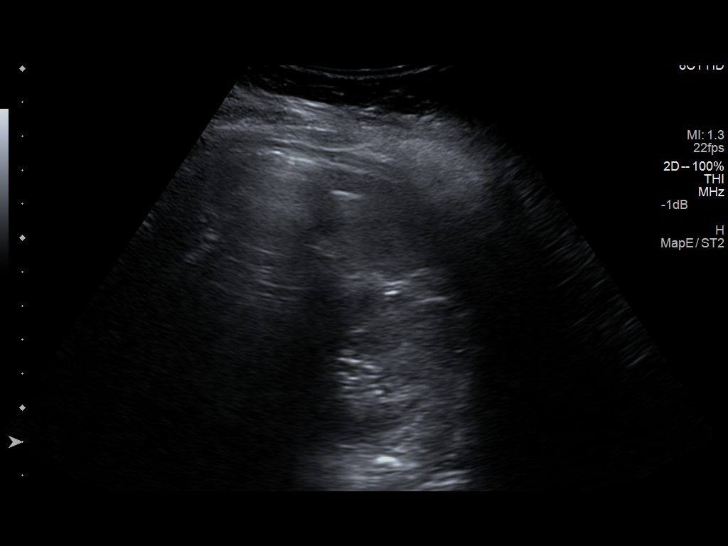
[im 33/40]
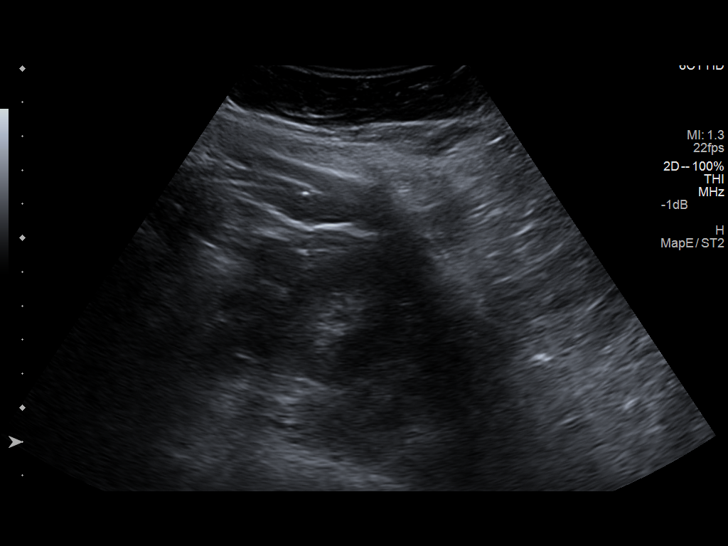
[im 36/40]
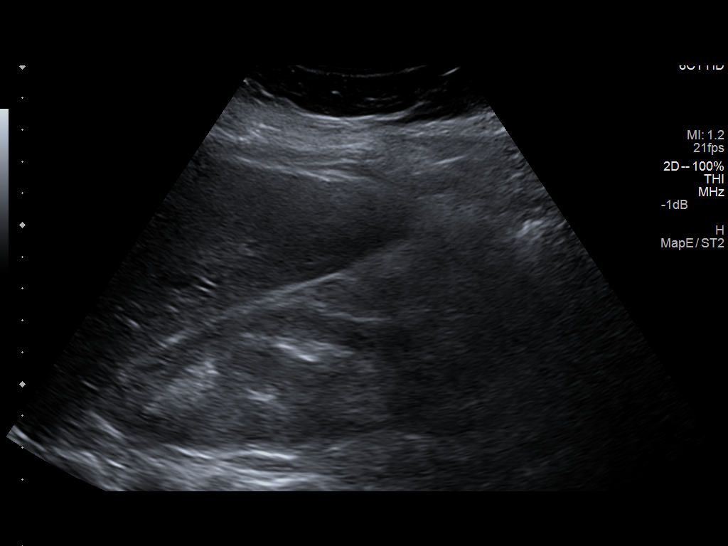
[im 40/40]
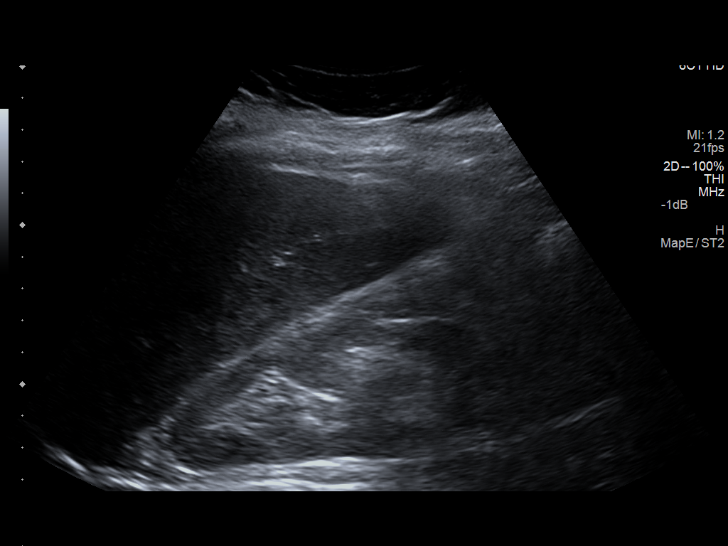

[14 of 25 positions shown; findings below may reference images not displayed]

FINDINGS: Right Kidney

Length: 13.1 cm. Tiny 3 mm hyperechoic focus noted right kidney
without shadowing. This most likely represents a tiny
angiomyolipoma. Tiny non shadowing stone cannot be excluded. Renal
echotexture otherwise normal. No hydronephrosis.

Left Kidney

Length: 13.1 cm.. Echogenicity within normal limits. No mass or
hydronephrosis visualized.

Bladder

Appears normal for degree of bladder distention.
IMPRESSION: 1. Tiny 3 mm hyperechoic focus right kidney, most likely a tiny
angiomyolipoma.
2. Exam otherwise normal. No evidence of hydronephrosis. No evidence
of bladder distention.

## 2014-10-17 LAB — CULTURE, BLOOD (ROUTINE X 2)
Culture: NO GROWTH
Culture: NO GROWTH

## 2014-10-18 ENCOUNTER — Emergency Department (HOSPITAL_COMMUNITY)
Admission: EM | Admit: 2014-10-18 | Discharge: 2014-10-19 | Disposition: A | Discharge status: Home / Self Care | Payer: BLUE CROSS/BLUE SHIELD | Attending: Emergency Medicine | Admitting: Emergency Medicine

## 2014-10-18 ENCOUNTER — Encounter (HOSPITAL_COMMUNITY): Payer: Self-pay | Admitting: *Deleted

## 2014-10-18 ENCOUNTER — Emergency Department (HOSPITAL_COMMUNITY): Payer: BLUE CROSS/BLUE SHIELD

## 2014-10-18 DIAGNOSIS — R0602 Shortness of breath: Secondary | ICD-10-CM | POA: Diagnosis not present

## 2014-10-18 DIAGNOSIS — Z7901 Long term (current) use of anticoagulants: Secondary | ICD-10-CM | POA: Insufficient documentation

## 2014-10-18 DIAGNOSIS — Z87442 Personal history of urinary calculi: Secondary | ICD-10-CM | POA: Diagnosis not present

## 2014-10-18 DIAGNOSIS — R079 Chest pain, unspecified: Secondary | ICD-10-CM | POA: Diagnosis present

## 2014-10-18 DIAGNOSIS — R0789 Other chest pain: Secondary | ICD-10-CM | POA: Diagnosis not present

## 2014-10-18 DIAGNOSIS — Z794 Long term (current) use of insulin: Secondary | ICD-10-CM | POA: Insufficient documentation

## 2014-10-18 DIAGNOSIS — Z7982 Long term (current) use of aspirin: Secondary | ICD-10-CM | POA: Diagnosis not present

## 2014-10-18 DIAGNOSIS — E119 Type 2 diabetes mellitus without complications: Secondary | ICD-10-CM | POA: Insufficient documentation

## 2014-10-18 DIAGNOSIS — Z87828 Personal history of other (healed) physical injury and trauma: Secondary | ICD-10-CM | POA: Diagnosis not present

## 2014-10-18 DIAGNOSIS — R61 Generalized hyperhidrosis: Secondary | ICD-10-CM | POA: Diagnosis not present

## 2014-10-18 DIAGNOSIS — F329 Major depressive disorder, single episode, unspecified: Secondary | ICD-10-CM | POA: Insufficient documentation

## 2014-10-18 DIAGNOSIS — F419 Anxiety disorder, unspecified: Secondary | ICD-10-CM | POA: Insufficient documentation

## 2014-10-18 DIAGNOSIS — Z79899 Other long term (current) drug therapy: Secondary | ICD-10-CM | POA: Diagnosis not present

## 2014-10-18 DIAGNOSIS — Z8719 Personal history of other diseases of the digestive system: Secondary | ICD-10-CM | POA: Diagnosis not present

## 2014-10-18 DIAGNOSIS — G43909 Migraine, unspecified, not intractable, without status migrainosus: Secondary | ICD-10-CM | POA: Insufficient documentation

## 2014-10-18 DIAGNOSIS — Z86711 Personal history of pulmonary embolism: Secondary | ICD-10-CM | POA: Insufficient documentation

## 2014-10-18 DIAGNOSIS — M1990 Unspecified osteoarthritis, unspecified site: Secondary | ICD-10-CM | POA: Diagnosis not present

## 2014-10-18 DIAGNOSIS — Z792 Long term (current) use of antibiotics: Secondary | ICD-10-CM | POA: Diagnosis not present

## 2014-10-18 LAB — BASIC METABOLIC PANEL
Anion gap: 10 (ref 5–15)
BUN: <5 mg/dL — ABNORMAL LOW (ref 6–20)
CO2: 30 mmol/L (ref 22–32)
Calcium: 9.2 mg/dL (ref 8.9–10.3)
Chloride: 94 mmol/L — ABNORMAL LOW (ref 101–111)
Creatinine, Ser: 1.2 mg/dL — ABNORMAL HIGH (ref 0.44–1.00)
GFR calc Af Amer: >60 mL/min (ref >60)
GFR calc non Af Amer: 55 mL/min — ABNORMAL LOW (ref >60)
Glucose, Bld: 350 mg/dL — ABNORMAL HIGH (ref 65–99)
Potassium: 4.3 mmol/L (ref 3.5–5.1)
Sodium: 134 mmol/L — ABNORMAL LOW (ref 135–145)

## 2014-10-18 LAB — CBC
HCT: 38.8 % (ref 36.0–46.0)
Hemoglobin: 12.8 g/dL (ref 12.0–15.0)
MCH: 31.8 pg (ref 26.0–34.0)
MCHC: 33 g/dL (ref 30.0–36.0)
MCV: 96.5 fL (ref 78.0–100.0)
Platelets: 410 10*3/uL — ABNORMAL HIGH (ref 150–400)
RBC: 4.02 MIL/uL (ref 3.87–5.11)
RDW: 13.1 % (ref 11.5–15.5)
WBC: 11.3 10*3/uL — ABNORMAL HIGH (ref 4.0–10.5)

## 2014-10-18 LAB — I-STAT TROPONIN, ED: Troponin i, poc: 0 ng/mL (ref 0.00–0.08)

## 2014-10-18 MED ORDER — SODIUM CHLORIDE 0.9 % IV BOLUS (SEPSIS)
1000.0000 mL | Freq: Once | INTRAVENOUS | Status: AC
  Administered 2014-10-18: 1000 mL via INTRAVENOUS

## 2014-10-18 NOTE — ED Provider Notes (Signed)
CSN: [id number]     Arrival date & time 10/18/14  2126 History   By signing my name below, I, Forrestine Him, attest that this documentation has been prepared under the direction and in the presence of Julianne Rice, MD. Electronically Signed: Forrestine Him, ED Scribe. 10/18/2014. 11:31 PM.   Chief Complaint  Patient presents with  . Chest Pain   The history is provided by the patient. No language interpreter was used.    HPI Comments: Maureen [name] is a 44 y.o. female with a PMHx of hyperlipidemia and DM who presents to the Emergency Department complaining of constant, ongoing, unchanged substernal chest pain that radiates up to her neck onset 8:00 PM this evening while resting. Pain is worse with coughing. Ongoing shortness of breath and diaphoresis also reported. New cough noted x 1 day. No OTC medications or home remedies attempted prior to arrival. Denies any recent fever or chills. Ms. Boberg was recently seen in the Emergency Department for PNA. She was started on Levaquin and reports some improvement after start of medication. She is on Xarelto daily and denies any missed doses. Last dose of lantis at 9:00 PM this evening.  Past Medical History  Diagnosis Date  . 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 Fibromyalgia   . Hyperlipidemia   . Abdominal pain   . Nausea & vomiting   . Biliary dyskinesia   . Depression   . Joint pain   . Skin rash     due to medications  . Post traumatic stress disorder (PTSD)   . Sleep trouble   . Major depressive disorder   . Cervical strain   . Closed head injury 2010  . Renal stones   . Migraine     hx of none recent  . Arthritis   . Hypoglycemia   . Anxiety   . Pulmonary embolism    Past Surgical History  Procedure Laterality Date  . Lipoma removed  yrs ago  . Knee surgery Left 2012  . Cesarean section  1997, 1999  . Cholecystectomy  02/10/2011    Procedure: LAPAROSCOPIC CHOLECYSTECTOMY WITH  INTRAOPERATIVE CHOLANGIOGRAM;  Surgeon: Judieth Keens, DO;  Location: [facility];  Service: General;  Laterality: N/A;  . Robotic assisted laparoscopic lysis of adhesion N/A 03/04/2012    Procedure: ROBOTIC ASSISTED LAPAROSCOPIC LYSIS OF EXTENSIVE ADHESIONS;  Surgeon: Claiborne Billings A. Pamala Hurry, MD;  Location: [facility];  Service: Gynecology;  Laterality: N/A;  . Tonsillectomy  2001  . Abdominal hysterectomy  2001  . Eye surgery  2006    laser eye surgery left eye  . Exploratory laparomty  Mar 04, 2012  . Laparoscopic appendectomy N/A 10/12/2012    Procedure: DIAGNOSTIC APPENDECTOMY LAPAROSCOPIC;  Surgeon: Madilyn Hook, DO;  Location: [facility];  Service: General;  Laterality: N/A;  . Appendectomy     Family History  Problem Relation Age of Onset  . Cancer Father     lymphoma  . Nephrolithiasis Father   . Vascular Disease Father   . Alcohol abuse Father   . Drug abuse Father   . Cancer Maternal Grandmother     colon  . Hypertension Mother   . Heart disease Mother   . Cataracts Mother   . Depression Mother   . Diabetes Brother   . Drug abuse Brother   . Mental illness Maternal Grandfather   . Cancer Maternal Grandfather   . Heart disease Paternal Grandmother   . Heart disease Paternal  Grandfather   . Suicidality Maternal Uncle    Social History  Substance Use Topics  . Smoking status: Never Smoker   . Smokeless tobacco: Never Used  . Alcohol Use: No   OB History    No data available     Review of Systems  Constitutional: Negative for fever and chills.  Respiratory: Positive for cough and shortness of breath.   Cardiovascular: Positive for chest pain.  Gastrointestinal: Negative for nausea, vomiting, abdominal pain and diarrhea.  Musculoskeletal: Negative for back pain.  Skin: Negative for rash.  Neurological: Negative for dizziness, weakness, light-headedness, numbness and headaches.  Psychiatric/Behavioral: Negative for confusion.  All other systems reviewed and are  negative.     Allergies  Cephalexin; Ibuprofen; and Meloxicam  Home Medications   Prior to Admission medications   Medication Sig Start Date End Date Taking? Authorizing Provider  aspirin EC 81 MG tablet Take 1 tablet (81 mg total) by mouth daily with breakfast. 10/16/13  Yes Kerrie Buffalo, NP  clonazePAM (KLONOPIN) 0.5 MG tablet Take one twice daily as needed for anxiety and itching Patient taking differently: Take 0.5 mg by mouth 2 (two) times daily as needed for anxiety. Take one twice daily as needed for anxiety and itching 10/06/14  Yes Posey Boyer, MD  FLUoxetine (PROZAC) 40 MG capsule Take 1 capsule (40 mg total) by mouth daily. For depression 05/29/14  Yes Charlcie Cradle, MD  HYDROcodone-acetaminophen (NORCO/VICODIN) 5-325 MG per tablet Take 1 tablet by mouth every 4 (four) hours as needed. Patient taking differently: Take 1 tablet by mouth every 4 (four) hours as needed for moderate pain.  10/01/14  Yes Kristen N Ward, DO  insulin aspart (NOVOLOG) 100 UNIT/ML injection Inject 8 Units into the skin 3 (three) times daily with meals. 10/16/13  Yes Kerrie Buffalo, NP  insulin glargine (LANTUS) 100 UNIT/ML injection Inject 26 Units into the skin at bedtime.    Yes Historical Provider, MD  levofloxacin (LEVAQUIN) 750 MG tablet Take 1 tablet (750 mg total) by mouth daily. X 7 days Patient taking differently: Take 750 mg by mouth daily. X 7 days. Last dose today 10/12/14  Yes Wandra Arthurs, MD  pantoprazole (PROTONIX) 40 MG tablet Take 1 tablet (40 mg total) by mouth daily. 09/05/14  Yes Geradine Girt, DO  rivaroxaban (XARELTO) 20 MG TABS tablet Take 20 mg by mouth daily with supper.   Yes Historical Provider, MD  Rivaroxaban (XARELTO STARTER PACK) 15 & 20 MG TBPK Take as directed on package: Start with one 15mg  tablet by mouth twice a day with food. On Day 22, switch to one 20mg  tablet once a day with food. Patient not taking: Reported on 10/11/2014 09/05/14   Geradine Girt, DO  traMADol  (ULTRAM) 50 MG tablet Take 1 tablet (50 mg total) by mouth every 6 (six) hours as needed for moderate pain. Patient not taking: Reported on 10/06/2014 09/28/14   Posey Boyer, MD   Triage Vitals: BP 131/60 mmHg  Pulse 80  Temp(Src) 98.4 F (36.9 C) (Oral)  Resp 15  SpO2 99%   Physical Exam  Constitutional: She is oriented to person, place, and time. She appears well-developed and well-nourished. No distress.  HENT:  Head: Normocephalic and atraumatic.  Mouth/Throat: Oropharynx is clear and moist. No oropharyngeal exudate.  Eyes: EOM are normal. Pupils are equal, round, and reactive to light.  Neck: Normal range of motion. Neck supple.  Cardiovascular: Normal rate and regular rhythm.   Pulmonary/Chest: Effort  normal and breath sounds normal. No respiratory distress. She has no wheezes. She has no rales. She exhibits tenderness (chest pain reproduced with palpation of the right parasternal chest wall. There is no crepitance or deformity.).  Abdominal: Soft. Bowel sounds are normal. She exhibits no distension and no mass. There is no tenderness. There is no rebound and no guarding.  Musculoskeletal: Normal range of motion. She exhibits no edema or tenderness.  No midline thoracic or lumbar tenderness. No CVA tenderness. No lower extremity swelling or pain. Distal pulses intact and equal.  Neurological: She is alert and oriented to person, place, and time.  Moves all extremities without deficit. Sensation fully intact.  Skin: Skin is warm and dry. No rash noted. No erythema.  Psychiatric: She has a normal mood and affect. Her behavior is normal.  Nursing note and vitals reviewed.   ED Course  Procedures (including critical care time)  DIAGNOSTIC STUDIES: Oxygen Saturation is 99% on RA, Normal by my interpretation.    COORDINATION OF CARE: 11:10 PM- Will give fluids. Will order CXR, i-stat troponin, BMP, or CBC. Discussed treatment plan with pt at bedside and pt agreed to plan.      Labs Review Labs Reviewed  BASIC METABOLIC PANEL - Abnormal; Notable for the following:    Sodium 134 (*)    Chloride 94 (*)    Glucose, Bld 350 (*)    BUN <5 (*)    Creatinine, Ser 1.20 (*)    GFR calc non Af Amer 55 (*)    All other components within normal limits  CBC - Abnormal; Notable for the following:    WBC 11.3 (*)    Platelets 410 (*)    All other components within normal limits  CBG MONITORING, ED - Abnormal; Notable for the following:    Glucose-Capillary 51 (*)    All other components within normal limits  CBG MONITORING, ED - Abnormal; Notable for the following:    Glucose-Capillary 121 (*)    All other components within normal limits  I-STAT TROPOININ, ED  Randolm Idol, ED    Imaging Review Dg Chest 2 View  10/18/2014   CLINICAL DATA:  Chest pain. Diagnosed with PE and pneumonia in the past 2 weeks  EXAM: CHEST  2 VIEW  COMPARISON:  Seven days ago  FINDINGS: Good but partial clearance of right upper lobe opacity. There is no edema, effusion, or pneumothorax. Normal heart size and mediastinal contours. Cholecystectomy. No osseous findings to explain chest pain.  IMPRESSION: 1. No acute finding to explain new chest pain. 2. Near complete clearing of right upper lobe opacity/pneumonia. Additional radiograph recommended in 3-4 weeks.   Electronically Signed   By: Monte Fantasia M.D.   On: 10/18/2014 21:53   I have personally reviewed and evaluated these images and lab results as part of my medical decision-making.   EKG Interpretation   Date/Time:  Thursday October 18 2014 21:33:38 EDT Ventricular Rate:  91 PR Interval:  120 QRS Duration: 80 QT Interval:  360 QTC Calculation: 442 R Axis:   89 Text Interpretation:  Normal sinus rhythm Normal ECG Confirmed by  Lita Mains  MD, DAVID ([id number]) on 10/18/2014 11:06:43 PM      MDM   Final diagnoses:  Chest wall pain    I personally performed the services described in this documentation, which was scribed  in my presence. The recorded information has been reviewed and is accurate.  Patient with chest pain that is reproduced on exam. She  has a normal EKG and troponin 2 which are normal. I have low suspicion for coronary artery disease. Patient also has been taking coagulants as prescribed. She is feeling better. We'll discharge home. Return precautions given.  Julianne Rice, MD 10/20/14 [phone]

## 2014-10-18 NOTE — ED Notes (Signed)
Pt in tonight after sudden onset of chest pain and shortness of breath, recent history of PE, pneumonia and hospital admission, pt pale during triage, skin warm and dry, continues taking blood thinners

## 2014-10-19 LAB — I-STAT TROPONIN, ED: Troponin i, poc: 0 ng/mL (ref 0.00–0.08)

## 2014-10-19 LAB — CBG MONITORING, ED
Glucose-Capillary: 51 mg/dL — ABNORMAL LOW (ref 65–99)
Glucose-Capillary: 121 mg/dL — ABNORMAL HIGH (ref 65–99)

## 2014-10-19 MED ORDER — DEXTROSE 50 % IV SOLN
1.0000 | Freq: Once | INTRAVENOUS | Status: DC
  Filled 2014-10-19: qty 50

## 2014-10-19 MED ORDER — ONDANSETRON HCL 4 MG/2ML IJ SOLN
4.0000 mg | Freq: Once | INTRAMUSCULAR | Status: AC
  Administered 2014-10-19: 4 mg via INTRAVENOUS
  Filled 2014-10-19: qty 2

## 2014-10-19 NOTE — ED Notes (Signed)
Pt left with all belongings and ambulated out of treatment area.  

## 2014-10-19 NOTE — Discharge Instructions (Signed)

## 2014-10-23 ENCOUNTER — Inpatient Hospital Stay (HOSPITAL_COMMUNITY)
Admission: EM | Admit: 2014-10-23 | Discharge: 2014-10-25 | DRG: 391 | Disposition: A | Discharge status: Home / Self Care | Payer: BLUE CROSS/BLUE SHIELD | Attending: Internal Medicine | Admitting: Internal Medicine

## 2014-10-23 ENCOUNTER — Encounter (HOSPITAL_COMMUNITY): Payer: Self-pay | Admitting: Emergency Medicine

## 2014-10-23 DIAGNOSIS — E1010 Type 1 diabetes mellitus with ketoacidosis without coma: Secondary | ICD-10-CM | POA: Diagnosis present

## 2014-10-23 DIAGNOSIS — F411 Generalized anxiety disorder: Secondary | ICD-10-CM | POA: Diagnosis present

## 2014-10-23 DIAGNOSIS — R109 Unspecified abdominal pain: Secondary | ICD-10-CM | POA: Diagnosis present

## 2014-10-23 DIAGNOSIS — Z7901 Long term (current) use of anticoagulants: Secondary | ICD-10-CM

## 2014-10-23 DIAGNOSIS — E1110 Type 2 diabetes mellitus with ketoacidosis without coma: Secondary | ICD-10-CM | POA: Diagnosis present

## 2014-10-23 DIAGNOSIS — R1110 Vomiting, unspecified: Secondary | ICD-10-CM

## 2014-10-23 DIAGNOSIS — R197 Diarrhea, unspecified: Secondary | ICD-10-CM

## 2014-10-23 DIAGNOSIS — A084 Viral intestinal infection, unspecified: Principal | ICD-10-CM | POA: Diagnosis present

## 2014-10-23 DIAGNOSIS — M1990 Unspecified osteoarthritis, unspecified site: Secondary | ICD-10-CM | POA: Diagnosis present

## 2014-10-23 DIAGNOSIS — Z86711 Personal history of pulmonary embolism: Secondary | ICD-10-CM

## 2014-10-23 DIAGNOSIS — Z79899 Other long term (current) drug therapy: Secondary | ICD-10-CM

## 2014-10-23 DIAGNOSIS — E785 Hyperlipidemia, unspecified: Secondary | ICD-10-CM | POA: Diagnosis present

## 2014-10-23 DIAGNOSIS — R945 Abnormal results of liver function studies: Secondary | ICD-10-CM | POA: Diagnosis present

## 2014-10-23 DIAGNOSIS — E109 Type 1 diabetes mellitus without complications: Secondary | ICD-10-CM | POA: Diagnosis present

## 2014-10-23 DIAGNOSIS — M797 Fibromyalgia: Secondary | ICD-10-CM | POA: Diagnosis present

## 2014-10-23 DIAGNOSIS — E11319 Type 2 diabetes mellitus with unspecified diabetic retinopathy without macular edema: Secondary | ICD-10-CM | POA: Diagnosis present

## 2014-10-23 DIAGNOSIS — Z7982 Long term (current) use of aspirin: Secondary | ICD-10-CM

## 2014-10-23 DIAGNOSIS — Z794 Long term (current) use of insulin: Secondary | ICD-10-CM

## 2014-10-23 DIAGNOSIS — R112 Nausea with vomiting, unspecified: Secondary | ICD-10-CM | POA: Diagnosis present

## 2014-10-23 DIAGNOSIS — F329 Major depressive disorder, single episode, unspecified: Secondary | ICD-10-CM | POA: Diagnosis present

## 2014-10-23 DIAGNOSIS — R7989 Other specified abnormal findings of blood chemistry: Secondary | ICD-10-CM | POA: Diagnosis present

## 2014-10-23 DIAGNOSIS — F4310 Post-traumatic stress disorder, unspecified: Secondary | ICD-10-CM | POA: Diagnosis present

## 2014-10-23 DIAGNOSIS — E10319 Type 1 diabetes mellitus with unspecified diabetic retinopathy without macular edema: Secondary | ICD-10-CM | POA: Diagnosis present

## 2014-10-23 DIAGNOSIS — E1042 Type 1 diabetes mellitus with diabetic polyneuropathy: Secondary | ICD-10-CM | POA: Diagnosis present

## 2014-10-23 LAB — CBC WITH DIFFERENTIAL/PLATELET
Basophils Absolute: 0.1 10*3/uL (ref 0.0–0.1)
Basophils Relative: 0 %
Eosinophils Absolute: 0.1 10*3/uL (ref 0.0–0.7)
Eosinophils Relative: 0 %
HCT: 42 % (ref 36.0–46.0)
Hemoglobin: 14.7 g/dL (ref 12.0–15.0)
Lymphocytes Relative: 16 %
Lymphs Abs: 2.6 10*3/uL (ref 0.7–4.0)
MCH: 32.9 pg (ref 26.0–34.0)
MCHC: 35 g/dL (ref 30.0–36.0)
MCV: 94 fL (ref 78.0–100.0)
Monocytes Absolute: 0.6 10*3/uL (ref 0.1–1.0)
Monocytes Relative: 4 %
Neutro Abs: 12.6 10*3/uL — ABNORMAL HIGH (ref 1.7–7.7)
Neutrophils Relative %: 80 %
Platelets: 480 10*3/uL — ABNORMAL HIGH (ref 150–400)
RBC: 4.47 MIL/uL (ref 3.87–5.11)
RDW: 13 % (ref 11.5–15.5)
WBC: 15.9 10*3/uL — ABNORMAL HIGH (ref 4.0–10.5)

## 2014-10-23 LAB — URINALYSIS, ROUTINE W REFLEX MICROSCOPIC
Bilirubin Urine: NEGATIVE
Glucose, UA: >1000 mg/dL — AB
Hgb urine dipstick: NEGATIVE
Ketones, ur: 15 mg/dL — AB
Leukocytes, UA: NEGATIVE
Nitrite: NEGATIVE
Protein, ur: 30 mg/dL — AB
Specific Gravity, Urine: 1.036 — ABNORMAL HIGH (ref 1.005–1.030)
Urobilinogen, UA: 1 mg/dL (ref 0.0–1.0)
pH: 7.5 (ref 5.0–8.0)

## 2014-10-23 LAB — COMPREHENSIVE METABOLIC PANEL
ALT: 17 U/L (ref 14–54)
AST: 29 U/L (ref 15–41)
Albumin: 4 g/dL (ref 3.5–5.0)
Alkaline Phosphatase: 137 U/L — ABNORMAL HIGH (ref 38–126)
Anion gap: 16 — ABNORMAL HIGH (ref 5–15)
BUN: 8 mg/dL (ref 6–20)
CO2: 27 mmol/L (ref 22–32)
Calcium: 9.7 mg/dL (ref 8.9–10.3)
Chloride: 93 mmol/L — ABNORMAL LOW (ref 101–111)
Creatinine, Ser: 1.09 mg/dL — ABNORMAL HIGH (ref 0.44–1.00)
GFR calc Af Amer: >60 mL/min (ref >60)
GFR calc non Af Amer: >60 mL/min (ref >60)
Glucose, Bld: 251 mg/dL — ABNORMAL HIGH (ref 65–99)
Potassium: 4.2 mmol/L (ref 3.5–5.1)
Sodium: 136 mmol/L (ref 135–145)
Total Bilirubin: 2 mg/dL — ABNORMAL HIGH (ref 0.3–1.2)
Total Protein: 7.9 g/dL (ref 6.5–8.1)

## 2014-10-23 LAB — URINE MICROSCOPIC-ADD ON

## 2014-10-23 MED ORDER — SODIUM CHLORIDE 0.9 % IV BOLUS (SEPSIS)
3000.0000 mL | Freq: Once | INTRAVENOUS | Status: AC
Start: 2014-10-23 — End: 2014-10-24
  Administered 2014-10-24: 1000 mL via INTRAVENOUS

## 2014-10-23 MED ORDER — ONDANSETRON HCL 4 MG/2ML IJ SOLN
4.0000 mg | Freq: Once | INTRAMUSCULAR | Status: AC
  Administered 2014-10-24: 4 mg via INTRAVENOUS
  Filled 2014-10-23: qty 2

## 2014-10-23 NOTE — ED Provider Notes (Signed)
CSN: [id number]     Arrival date & time 10/23/14  1959 History   First MD Initiated Contact with Patient 10/23/14 2141     Chief Complaint  Patient presents with  . Hematemesis     (Consider location/radiation/quality/duration/timing/severity/associated sxs/prior Treatment) HPI  Maureen [name] is a 44 y.o. female female who presents for evaluation of nausea, vomiting and diarrhea which started today. She also has intermittent ongoing hemoptysis, for several weeks. She recently completed a oral course of Levaquin, for "pneumonia". She is also on xarelto, for PE. Her last CT done recently showed resolution of the PE. She denies fever, chills, weakness, dizziness, or change in urine habits. She came here by private vehicle. There are no other known modifying factors.  Past Medical History  Diagnosis Date  . 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 Fibromyalgia   . Hyperlipidemia   . Abdominal pain   . Nausea & vomiting   . Biliary dyskinesia   . Depression   . Joint pain   . Skin rash     due to medications  . Post traumatic stress disorder (PTSD)   . Sleep trouble   . Major depressive disorder ([place])   . Cervical strain   . Closed head injury 2010  . Renal stones   . Migraine     hx of none recent  . Arthritis   . Hypoglycemia   . Anxiety   . Pulmonary embolism [facility])    Past Surgical History  Procedure Laterality Date  . Lipoma removed  yrs ago  . Knee surgery Left 2012  . Cesarean section  1997, 1999  . Cholecystectomy  02/10/2011    Procedure: LAPAROSCOPIC CHOLECYSTECTOMY WITH INTRAOPERATIVE CHOLANGIOGRAM;  Surgeon: Judieth Keens, DO;  Location: [place];  Service: General;  Laterality: N/A;  . Robotic assisted laparoscopic lysis of adhesion N/A 03/04/2012    Procedure: ROBOTIC ASSISTED LAPAROSCOPIC LYSIS OF EXTENSIVE ADHESIONS;  Surgeon: Claiborne Billings A. Pamala Hurry, MD;  Location: [facility];  Service: Gynecology;  Laterality: N/A;  .  Tonsillectomy  2001  . Abdominal hysterectomy  2001  . Eye surgery  2006    laser eye surgery left eye  . Exploratory laparomty  Mar 04, 2012  . Laparoscopic appendectomy N/A 10/12/2012    Procedure: DIAGNOSTIC APPENDECTOMY LAPAROSCOPIC;  Surgeon: Madilyn Hook, DO;  Location: [facility];  Service: General;  Laterality: N/A;  . Appendectomy     Family History  Problem Relation Age of Onset  . Cancer Father     lymphoma  . Nephrolithiasis Father   . Vascular Disease Father   . Alcohol abuse Father   . Drug abuse Father   . Cancer Maternal Grandmother     colon  . Hypertension Mother   . Heart disease Mother   . Cataracts Mother   . Depression Mother   . Diabetes Brother   . Drug abuse Brother   . Mental illness Maternal Grandfather   . Cancer Maternal Grandfather   . Heart disease Paternal Grandmother   . Heart disease Paternal Grandfather   . Suicidality Maternal Uncle    Social History  Substance Use Topics  . Smoking status: Never Smoker   . Smokeless tobacco: Never Used  . Alcohol Use: No   OB History    No data available     Review of Systems  All other systems reviewed and are negative.     Allergies  Cephalexin; Ibuprofen; and Meloxicam  Home  Medications   Prior to Admission medications   Medication Sig Start Date End Date Taking? Authorizing Provider  aspirin EC 81 MG tablet Take 1 tablet (81 mg total) by mouth daily with breakfast. 10/16/13   Kerrie Buffalo, NP  clonazePAM (KLONOPIN) 0.5 MG tablet Take one twice daily as needed for anxiety and itching Patient taking differently: Take 0.5 mg by mouth 2 (two) times daily as needed for anxiety. Take one twice daily as needed for anxiety and itching 10/06/14   Posey Boyer, MD  FLUoxetine (PROZAC) 40 MG capsule Take 1 capsule (40 mg total) by mouth daily. For depression 05/29/14   Charlcie Cradle, MD  HYDROcodone-acetaminophen (NORCO/VICODIN) 5-325 MG per tablet Take 1 tablet by mouth every 4 (four) hours as  needed. Patient taking differently: Take 1 tablet by mouth every 4 (four) hours as needed for moderate pain.  10/01/14   Kristen N Ward, DO  insulin aspart (NOVOLOG) 100 UNIT/ML injection Inject 8 Units into the skin 3 (three) times daily with meals. 10/16/13   Kerrie Buffalo, NP  insulin glargine (LANTUS) 100 UNIT/ML injection Inject 26 Units into the skin at bedtime.     Historical Provider, MD  levofloxacin (LEVAQUIN) 750 MG tablet Take 1 tablet (750 mg total) by mouth daily. X 7 days Patient taking differently: Take 750 mg by mouth daily. X 7 days. Last dose today 10/12/14   Wandra Arthurs, MD  pantoprazole (PROTONIX) 40 MG tablet Take 1 tablet (40 mg total) by mouth daily. 09/05/14   Geradine Girt, DO  Rivaroxaban (XARELTO STARTER PACK) 15 & 20 MG TBPK Take as directed on package: Start with one 15mg  tablet by mouth twice a day with food. On Day 22, switch to one 20mg  tablet once a day with food. Patient not taking: Reported on 10/11/2014 09/05/14   Geradine Girt, DO  rivaroxaban (XARELTO) 20 MG TABS tablet Take 20 mg by mouth daily with supper.    Historical Provider, MD  traMADol (ULTRAM) 50 MG tablet Take 1 tablet (50 mg total) by mouth every 6 (six) hours as needed for moderate pain. Patient not taking: Reported on 10/06/2014 09/28/14   Posey Boyer, MD   BP 145/69 mmHg  Pulse 85  Temp(Src) 98.5 F (36.9 C) (Oral)  Resp 17  Ht 5\' 4"  (1.626 m)  Wt 157 lb (71.215 kg)  BMI 26.94 kg/m2  SpO2 100% Physical Exam  Constitutional: She is oriented to person, place, and time. She appears well-developed and well-nourished.  HENT:  Head: Normocephalic and atraumatic.  Right Ear: External ear normal.  Left Ear: External ear normal.  Eyes: Conjunctivae and EOM are normal. Pupils are equal, round, and reactive to light.  Neck: Normal range of motion and phonation normal. Neck supple.  Cardiovascular: Normal rate, regular rhythm and normal heart sounds.   Pulmonary/Chest: Effort normal and breath  sounds normal. She exhibits no tenderness and no bony tenderness.  Abdominal: Soft. She exhibits no distension and no mass. There is tenderness (Diffuse, mild). There is no rebound and no guarding.  Musculoskeletal: Normal range of motion.  Neurological: She is alert and oriented to person, place, and time. No cranial nerve deficit or sensory deficit. She exhibits normal muscle tone. Coordination normal.  Skin: Skin is warm, dry and intact.  Psychiatric: She has a normal mood and affect. Her behavior is normal. Judgment and thought content normal.  Nursing note and vitals reviewed.   ED Course  Procedures (including critical care time)  Initial evaluation is consistent with intravascular volume depletion likely secondary to multiple problems. We'll treat and reevaluate  Medications  sodium chloride 0.9 % bolus 3,000 mL (not administered)  ondansetron (ZOFRAN) injection 4 mg (not administered)    Patient Vitals for the past 24 hrs:  BP Temp Temp src Pulse Resp SpO2 Height Weight  10/23/14 2139 145/69 mmHg - - 85 17 100 % - -  10/23/14 2031 114/71 mmHg 98.5 F (36.9 C) Oral 90 16 100 % 5\' 4"  (1.626 m) 157 lb (71.215 kg)    11:15- PM Reevaluation with update and discussion. After initial assessment and treatment, an updated evaluation reveals patient fairly comfortable at this time. IV fluids had not yet started. She is willing to try drinking some oral fluids.Daleen Bo L    Labs Review Labs Reviewed  CBC WITH DIFFERENTIAL/PLATELET - Abnormal; Notable for the following:    WBC 15.9 (*)    Platelets 480 (*)    Neutro Abs 12.6 (*)    All other components within normal limits  COMPREHENSIVE METABOLIC PANEL - Abnormal; Notable for the following:    Chloride 93 (*)    Glucose, Bld 251 (*)    Creatinine, Ser 1.09 (*)    Alkaline Phosphatase 137 (*)    Total Bilirubin 2.0 (*)    Anion gap 16 (*)    All other components within normal limits  URINALYSIS, ROUTINE W REFLEX  MICROSCOPIC (NOT AT [facility]) - Abnormal; Notable for the following:    APPearance CLOUDY (*)    Specific Gravity, Urine 1.036 (*)    Glucose, UA >1000 (*)    Ketones, ur 15 (*)    Protein, ur 30 (*)    All other components within normal limits  URINE MICROSCOPIC-ADD ON - Abnormal; Notable for the following:    Squamous Epithelial / LPF MANY (*)    Bacteria, UA MANY (*)    Casts HYALINE CASTS (*)    All other components within normal limits    Imaging Review No results found. I have personally reviewed and evaluated these images and lab results as part of my medical decision-making.   EKG Interpretation None      MDM   Final diagnoses:  Intractable vomiting with nausea, vomiting of unspecified type    Evaluation consistent with dehydration, and mild elevation of glucose. Mildly elevated anion gap 16. CO2 is normal. Patient requires IV fluid hydration and repeat chemistry, to evaluate again I get post fluids.  Nursing Notes Reviewed/ Care Coordinated, and agree without changes. Applicable Imaging Reviewed.  Interpretation of Laboratory Data incorporated into ED treatment  Plan- as per oncoming provider team    Daleen Bo, MD 10/27/14 [phone]

## 2014-10-23 NOTE — ED Notes (Signed)
Pt. reports multiple hematemesis today with occasional dry cough , denies diarrhea , no fever or chills.

## 2014-10-24 ENCOUNTER — Encounter (HOSPITAL_COMMUNITY): Payer: Self-pay

## 2014-10-24 DIAGNOSIS — R197 Diarrhea, unspecified: Secondary | ICD-10-CM

## 2014-10-24 DIAGNOSIS — R1110 Vomiting, unspecified: Secondary | ICD-10-CM | POA: Diagnosis present

## 2014-10-24 DIAGNOSIS — R1033 Periumbilical pain: Secondary | ICD-10-CM | POA: Diagnosis not present

## 2014-10-24 DIAGNOSIS — F329 Major depressive disorder, single episode, unspecified: Secondary | ICD-10-CM | POA: Diagnosis present

## 2014-10-24 DIAGNOSIS — M797 Fibromyalgia: Secondary | ICD-10-CM | POA: Diagnosis present

## 2014-10-24 DIAGNOSIS — R112 Nausea with vomiting, unspecified: Secondary | ICD-10-CM

## 2014-10-24 DIAGNOSIS — E1310 Other specified diabetes mellitus with ketoacidosis without coma: Secondary | ICD-10-CM

## 2014-10-24 DIAGNOSIS — E1110 Type 2 diabetes mellitus with ketoacidosis without coma: Secondary | ICD-10-CM | POA: Diagnosis present

## 2014-10-24 DIAGNOSIS — E1010 Type 1 diabetes mellitus with ketoacidosis without coma: Secondary | ICD-10-CM | POA: Diagnosis present

## 2014-10-24 DIAGNOSIS — F411 Generalized anxiety disorder: Secondary | ICD-10-CM

## 2014-10-24 DIAGNOSIS — E785 Hyperlipidemia, unspecified: Secondary | ICD-10-CM | POA: Diagnosis present

## 2014-10-24 DIAGNOSIS — Z86711 Personal history of pulmonary embolism: Secondary | ICD-10-CM | POA: Diagnosis not present

## 2014-10-24 DIAGNOSIS — E10319 Type 1 diabetes mellitus with unspecified diabetic retinopathy without macular edema: Secondary | ICD-10-CM | POA: Diagnosis present

## 2014-10-24 DIAGNOSIS — E0810 Diabetes mellitus due to underlying condition with ketoacidosis without coma: Secondary | ICD-10-CM | POA: Diagnosis not present

## 2014-10-24 DIAGNOSIS — Z79899 Other long term (current) drug therapy: Secondary | ICD-10-CM | POA: Diagnosis not present

## 2014-10-24 DIAGNOSIS — M1990 Unspecified osteoarthritis, unspecified site: Secondary | ICD-10-CM | POA: Diagnosis present

## 2014-10-24 DIAGNOSIS — Z7982 Long term (current) use of aspirin: Secondary | ICD-10-CM | POA: Diagnosis not present

## 2014-10-24 DIAGNOSIS — E1042 Type 1 diabetes mellitus with diabetic polyneuropathy: Secondary | ICD-10-CM | POA: Diagnosis present

## 2014-10-24 DIAGNOSIS — R7989 Other specified abnormal findings of blood chemistry: Secondary | ICD-10-CM | POA: Diagnosis not present

## 2014-10-24 DIAGNOSIS — A084 Viral intestinal infection, unspecified: Secondary | ICD-10-CM | POA: Diagnosis present

## 2014-10-24 DIAGNOSIS — Z794 Long term (current) use of insulin: Secondary | ICD-10-CM | POA: Diagnosis not present

## 2014-10-24 DIAGNOSIS — F4310 Post-traumatic stress disorder, unspecified: Secondary | ICD-10-CM | POA: Diagnosis present

## 2014-10-24 DIAGNOSIS — Z7901 Long term (current) use of anticoagulants: Secondary | ICD-10-CM | POA: Diagnosis not present

## 2014-10-24 LAB — CBC
HCT: 37.2 % (ref 36.0–46.0)
HCT: 37.6 % (ref 36.0–46.0)
HCT: 36.3 % (ref 36.0–46.0)
Hemoglobin: 12.5 g/dL (ref 12.0–15.0)
Hemoglobin: 12.8 g/dL (ref 12.0–15.0)
Hemoglobin: 12.2 g/dL (ref 12.0–15.0)
MCH: 31.7 pg (ref 26.0–34.0)
MCH: 32.3 pg (ref 26.0–34.0)
MCH: 32.1 pg (ref 26.0–34.0)
MCHC: 33.6 g/dL (ref 30.0–36.0)
MCHC: 34 g/dL (ref 30.0–36.0)
MCHC: 33.6 g/dL (ref 30.0–36.0)
MCV: 94.4 fL (ref 78.0–100.0)
MCV: 94.9 fL (ref 78.0–100.0)
MCV: 95.5 fL (ref 78.0–100.0)
Platelets: 440 10*3/uL — ABNORMAL HIGH (ref 150–400)
Platelets: 441 10*3/uL — ABNORMAL HIGH (ref 150–400)
Platelets: 466 10*3/uL — ABNORMAL HIGH (ref 150–400)
RBC: 3.94 MIL/uL (ref 3.87–5.11)
RBC: 3.96 MIL/uL (ref 3.87–5.11)
RBC: 3.8 MIL/uL — ABNORMAL LOW (ref 3.87–5.11)
RDW: 13 % (ref 11.5–15.5)
RDW: 13.2 % (ref 11.5–15.5)
RDW: 13.1 % (ref 11.5–15.5)
WBC: 16.4 10*3/uL — ABNORMAL HIGH (ref 4.0–10.5)
WBC: 21.6 10*3/uL — ABNORMAL HIGH (ref 4.0–10.5)
WBC: 16.1 10*3/uL — ABNORMAL HIGH (ref 4.0–10.5)

## 2014-10-24 LAB — GLUCOSE, CAPILLARY
Glucose-Capillary: 287 mg/dL — ABNORMAL HIGH (ref 65–99)
Glucose-Capillary: 334 mg/dL — ABNORMAL HIGH (ref 65–99)
Glucose-Capillary: 165 mg/dL — ABNORMAL HIGH (ref 65–99)
Glucose-Capillary: 112 mg/dL — ABNORMAL HIGH (ref 65–99)
Glucose-Capillary: 229 mg/dL — ABNORMAL HIGH (ref 65–99)

## 2014-10-24 LAB — COMPREHENSIVE METABOLIC PANEL
ALT: 10 U/L — ABNORMAL LOW (ref 14–54)
AST: 17 U/L (ref 15–41)
Albumin: 3.2 g/dL — ABNORMAL LOW (ref 3.5–5.0)
Alkaline Phosphatase: 113 U/L (ref 38–126)
Anion gap: 14 (ref 5–15)
BUN: 6 mg/dL (ref 6–20)
CO2: 25 mmol/L (ref 22–32)
Calcium: 8.3 mg/dL — ABNORMAL LOW (ref 8.9–10.3)
Chloride: 95 mmol/L — ABNORMAL LOW (ref 101–111)
Creatinine, Ser: 0.97 mg/dL (ref 0.44–1.00)
GFR calc Af Amer: >60 mL/min (ref >60)
GFR calc non Af Amer: >60 mL/min (ref >60)
Glucose, Bld: 417 mg/dL — ABNORMAL HIGH (ref 65–99)
Potassium: 3.7 mmol/L (ref 3.5–5.1)
Sodium: 134 mmol/L — ABNORMAL LOW (ref 135–145)
Total Bilirubin: 1.6 mg/dL — ABNORMAL HIGH (ref 0.3–1.2)
Total Protein: 6.8 g/dL (ref 6.5–8.1)

## 2014-10-24 LAB — LACTIC ACID, PLASMA: Lactic Acid, Venous: 1.5 mmol/L (ref 0.5–2.0)

## 2014-10-24 LAB — TYPE AND SCREEN
ABO/RH(D): B POS
Antibody Screen: NEGATIVE

## 2014-10-24 LAB — C DIFFICILE QUICK SCREEN W PCR REFLEX
C Diff antigen: NEGATIVE
C Diff interpretation: NEGATIVE
C Diff toxin: NEGATIVE

## 2014-10-24 LAB — BILIRUBIN, DIRECT: Bilirubin, Direct: 0.3 mg/dL (ref 0.1–0.5)

## 2014-10-24 LAB — LIPASE, BLOOD: Lipase: 19 U/L — ABNORMAL LOW (ref 22–51)

## 2014-10-24 LAB — TROPONIN I: Troponin I: <0.03 ng/mL (ref <0.031)

## 2014-10-24 LAB — PROTIME-INR
INR: 1.67 — ABNORMAL HIGH (ref 0.00–1.49)
Prothrombin Time: 19.7 seconds — ABNORMAL HIGH (ref 11.6–15.2)

## 2014-10-24 LAB — PROCALCITONIN: Procalcitonin: 0.19 ng/mL

## 2014-10-24 LAB — APTT: aPTT: 33 seconds (ref 24–37)

## 2014-10-24 LAB — ABO/RH: ABO/RH(D): B POS

## 2014-10-24 MED ORDER — PNEUMOCOCCAL VAC POLYVALENT 25 MCG/0.5ML IJ INJ
0.5000 mL | INJECTION | INTRAMUSCULAR | Status: AC
  Administered 2014-10-25: 0.5 mL via INTRAMUSCULAR
  Filled 2014-10-24: qty 0.5

## 2014-10-24 MED ORDER — INSULIN GLARGINE 100 UNIT/ML ~~LOC~~ SOLN
26.0000 [IU] | Freq: Every day | SUBCUTANEOUS | Status: DC
  Filled 2014-10-24: qty 0.26

## 2014-10-24 MED ORDER — ROSUVASTATIN CALCIUM 40 MG PO TABS
40.0000 mg | ORAL_TABLET | Freq: Every day | ORAL | Status: DC
  Administered 2014-10-24: 40 mg via ORAL
  Filled 2014-10-24: qty 2
  Filled 2014-10-24 (×2): qty 1

## 2014-10-24 MED ORDER — PROMETHAZINE HCL 25 MG/ML IJ SOLN
25.0000 mg | Freq: Once | INTRAMUSCULAR | Status: AC
  Administered 2014-10-24: 25 mg via INTRAVENOUS
  Filled 2014-10-24: qty 1

## 2014-10-24 MED ORDER — RIVAROXABAN 20 MG PO TABS
20.0000 mg | ORAL_TABLET | Freq: Every day | ORAL | Status: DC
  Administered 2014-10-24 – 2014-10-25 (×2): 20 mg via ORAL
  Filled 2014-10-24 (×2): qty 1

## 2014-10-24 MED ORDER — SODIUM CHLORIDE 0.9 % IV SOLN
INTRAVENOUS | Status: DC
  Administered 2014-10-24 – 2014-10-25 (×4): via INTRAVENOUS

## 2014-10-24 MED ORDER — INFLUENZA VAC SPLIT QUAD 0.5 ML IM SUSY
0.5000 mL | PREFILLED_SYRINGE | INTRAMUSCULAR | Status: AC
  Administered 2014-10-25: 0.5 mL via INTRAMUSCULAR
  Filled 2014-10-24: qty 0.5

## 2014-10-24 MED ORDER — SODIUM CHLORIDE 0.9 % IJ SOLN
3.0000 mL | Freq: Two times a day (BID) | INTRAMUSCULAR | Status: DC
  Administered 2014-10-24 – 2014-10-25 (×3): 3 mL via INTRAVENOUS

## 2014-10-24 MED ORDER — LORAZEPAM 2 MG/ML IJ SOLN
0.5000 mg | INTRAMUSCULAR | Status: DC | PRN
  Administered 2014-10-24: 0.5 mg via INTRAVENOUS
  Filled 2014-10-24: qty 1

## 2014-10-24 MED ORDER — FLUOXETINE HCL 40 MG PO CAPS: 40.0000 mg | ORAL_CAPSULE | Freq: Every day | ORAL | Status: DC

## 2014-10-24 MED ORDER — MORPHINE SULFATE (PF) 2 MG/ML IV SOLN
2.0000 mg | INTRAVENOUS | Status: DC | PRN
  Administered 2014-10-24 (×3): 2 mg via INTRAVENOUS
  Filled 2014-10-24 (×3): qty 1

## 2014-10-24 MED ORDER — ASPIRIN EC 81 MG PO TBEC
81.0000 mg | DELAYED_RELEASE_TABLET | Freq: Every day | ORAL | Status: DC
  Administered 2014-10-24: 81 mg via ORAL
  Filled 2014-10-24: qty 1

## 2014-10-24 MED ORDER — INSULIN GLARGINE 100 UNIT/ML ~~LOC~~ SOLN
26.0000 [IU] | Freq: Every day | SUBCUTANEOUS | Status: DC
  Administered 2014-10-24: 26 [IU] via SUBCUTANEOUS
  Filled 2014-10-24 (×4): qty 0.26

## 2014-10-24 MED ORDER — ONDANSETRON HCL 4 MG/2ML IJ SOLN
4.0000 mg | Freq: Four times a day (QID) | INTRAMUSCULAR | Status: DC | PRN
  Administered 2014-10-25: 4 mg via INTRAVENOUS
  Filled 2014-10-24: qty 2

## 2014-10-24 MED ORDER — FAMOTIDINE IN NACL 20-0.9 MG/50ML-% IV SOLN
20.0000 mg | Freq: Two times a day (BID) | INTRAVENOUS | Status: DC
  Administered 2014-10-24 – 2014-10-25 (×3): 20 mg via INTRAVENOUS
  Filled 2014-10-24 (×4): qty 50

## 2014-10-24 MED ORDER — INSULIN ASPART 100 UNIT/ML ~~LOC~~ SOLN
0.0000 [IU] | Freq: Three times a day (TID) | SUBCUTANEOUS | Status: DC
  Administered 2014-10-24: 7 [IU] via SUBCUTANEOUS
  Administered 2014-10-24: 2 [IU] via SUBCUTANEOUS

## 2014-10-24 MED ORDER — RIVAROXABAN 20 MG PO TABS: 20.0000 mg | ORAL_TABLET | Freq: Every day | ORAL | Status: DC

## 2014-10-24 MED ORDER — FLUOXETINE HCL 20 MG PO CAPS
40.0000 mg | ORAL_CAPSULE | Freq: Every day | ORAL | Status: DC
  Administered 2014-10-24 – 2014-10-25 (×2): 40 mg via ORAL
  Filled 2014-10-24 (×2): qty 2

## 2014-10-24 MED ORDER — ONDANSETRON HCL 4 MG/2ML IJ SOLN
4.0000 mg | Freq: Three times a day (TID) | INTRAMUSCULAR | Status: DC | PRN
  Administered 2014-10-24: 4 mg via INTRAVENOUS
  Filled 2014-10-24: qty 2

## 2014-10-24 MED ORDER — CLONAZEPAM 0.5 MG PO TABS
0.5000 mg | ORAL_TABLET | Freq: Two times a day (BID) | ORAL | Status: DC | PRN
  Administered 2014-10-24: 0.5 mg via ORAL
  Filled 2014-10-24: qty 1

## 2014-10-24 NOTE — Progress Notes (Signed)
Attempt to get report from ed will return call

## 2014-10-24 NOTE — H&P (Addendum)
[facility] History and Physical  Maureen [name] OEU:[id number] DOB: 1970-06-20 DOA: 10/23/2014  Referring physician: ED physician PCP: [name], MD  Specialists:   Chief Complaint: Nausea, vomiting, diarrhea, abdominal pain  HPI: Maureen [name] is a 44 y.o. female with PMH of hyperlipidemia, type 1 diabetes, depression, anxiety, PTSD, migraine headache, history of head injury 2010, hx of PE on Xarelto, who presents with nausea, vomiting, diarrhea, abdominal pain.  This pt is belongs to [facility] medicine, but they capped.  Patient reports that she started having nausea, vomiting, diarrhea, abdominal pain since yesterday morning. She vomited 7 times with dark colored vomitus, which possibly contains blood. She had 5 watery bowel movements, without blood in it today. She has a abdominal pain. It is located and periumbilical area, 10 out of 10 in severity, burning like pain, nonradiating. It is not aggravated or alleviated by any known factors. She has mild dry cough and SOB. She also has mild chest pain. She coughs up streaks of blood. Patient does not have symptoms of UTI, rashes, unilateral weakness. Patient reports that she was recently treated for pneumonia with antibiotics.  In ED, patient was found to have elevated anion gap at 16, bicarbonate 27, potassium 4.2, renal function okay, WBC 15.9, hemoglobin 14.7, temperature normal, no tachycardia. Liver function okay except for elevated total bilirubin 2.0. Urinalysis is negative for UTI, but showed positive ketones, it was a dirty catch with many squamous cells. Patient is admitted to inpatient for further interventional treatment.  Where does patient live?   At home Can patient participate in ADLs?  Yes   Review of Systems:   General: no fevers, chills, no changes in body weight, has poor appetite, has fatigue HEENT: no blurry vision, hearing changes or sore throat Pulm: has dyspnea, coughing, no wheezing CV: has chest  pain, no palpitations Abd: has nausea, vomiting, abdominal pain, diarrhea, no constipation GU: no dysuria, burning on urination, increased urinary frequency, hematuria  Ext: no leg edema Neuro: no unilateral weakness, numbness, or tingling, no vision change or hearing loss Skin: no rash MSK: No muscle spasm, no deformity, no limitation of range of movement in spin Heme: No easy bruising.  Travel history: No recent long distant travel.  Allergy:  Allergies  Allergen Reactions  . Cephalexin Rash    Pt was admitted to the hospital upon taking.  . Ibuprofen Nausea And Vomiting and Rash  . Meloxicam Nausea Only    Past Medical History  Diagnosis Date  . 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 Fibromyalgia   . Hyperlipidemia   . Abdominal pain   . Nausea & vomiting   . Biliary dyskinesia   . Depression   . Joint pain   . Skin rash     due to medications  . Post traumatic stress disorder (PTSD)   . Sleep trouble   . Major depressive disorder ([place])   . Cervical strain   . Closed head injury 2010  . Renal stones   . Migraine     hx of none recent  . Arthritis   . Hypoglycemia   . Anxiety   . Pulmonary embolism [facility])     Past Surgical History  Procedure Laterality Date  . Lipoma removed  yrs ago  . Knee surgery Left 2012  . Cesarean section  1997, 1999  . Cholecystectomy  02/10/2011    Procedure: LAPAROSCOPIC CHOLECYSTECTOMY WITH INTRAOPERATIVE CHOLANGIOGRAM;  Surgeon: Judieth Keens, DO;  Location:  [place] [facility];  Service: General;  Laterality: N/A;  . Robotic assisted laparoscopic lysis of adhesion N/A 03/04/2012    Procedure: ROBOTIC ASSISTED LAPAROSCOPIC LYSIS OF EXTENSIVE ADHESIONS;  Surgeon: Claiborne Billings A. Pamala Hurry, MD;  Location: [facility];  Service: Gynecology;  Laterality: N/A;  . Tonsillectomy  2001  . Abdominal hysterectomy  2001  . Eye surgery  2006    laser eye surgery left eye  . Exploratory laparomty  Mar 04, 2012  . Laparoscopic  appendectomy N/A 10/12/2012    Procedure: DIAGNOSTIC APPENDECTOMY LAPAROSCOPIC;  Surgeon: Madilyn Hook, DO;  Location: [facility];  Service: General;  Laterality: N/A;  . Appendectomy      Social History:  reports that she has never smoked. She has never used smokeless tobacco. She reports that she does not drink alcohol or use illicit drugs.  Family History:  Family History  Problem Relation Age of Onset  . Cancer Father     lymphoma  . Nephrolithiasis Father   . Vascular Disease Father   . Alcohol abuse Father   . Drug abuse Father   . Cancer Maternal Grandmother     colon  . Hypertension Mother   . Heart disease Mother   . Cataracts Mother   . Depression Mother   . Diabetes Brother   . Drug abuse Brother   . Mental illness Maternal Grandfather   . Cancer Maternal Grandfather   . Heart disease Paternal Grandmother   . Heart disease Paternal Grandfather   . Suicidality Maternal Uncle      Prior to Admission medications   Medication Sig Start Date End Date Taking? Authorizing Provider  aspirin EC 81 MG tablet Take 1 tablet (81 mg total) by mouth daily with breakfast. 10/16/13  Yes Kerrie Buffalo, NP  clonazePAM (KLONOPIN) 0.5 MG tablet Take one twice daily as needed for anxiety and itching Patient taking differently: Take 0.5 mg by mouth 2 (two) times daily as needed for anxiety. Take one twice daily as needed for anxiety and itching 10/06/14  Yes Posey Boyer, MD  FLUoxetine (PROZAC) 40 MG capsule Take 1 capsule (40 mg total) by mouth daily. For depression 05/29/14  Yes Charlcie Cradle, MD  HYDROcodone-acetaminophen (NORCO/VICODIN) 5-325 MG per tablet Take 1 tablet by mouth every 4 (four) hours as needed. Patient taking differently: Take 1 tablet by mouth every 4 (four) hours as needed for moderate pain.  10/01/14  Yes Kristen N Ward, DO  insulin aspart (NOVOLOG) 100 UNIT/ML injection Inject 8 Units into the skin 3 (three) times daily with meals. 10/16/13  Yes Kerrie Buffalo, NP   insulin glargine (LANTUS) 100 UNIT/ML injection Inject 26 Units into the skin at bedtime.    Yes Historical Provider, MD  levofloxacin (LEVAQUIN) 750 MG tablet Take 1 tablet (750 mg total) by mouth daily. X 7 days Patient taking differently: Take 750 mg by mouth daily. X 7 days. Last dose today 10/12/14  Yes Wandra Arthurs, MD  pantoprazole (PROTONIX) 40 MG tablet Take 1 tablet (40 mg total) by mouth daily. 09/05/14  Yes Geradine Girt, DO  rivaroxaban (XARELTO) 20 MG TABS tablet Take 20 mg by mouth daily with supper.   Yes Historical Provider, MD  Rivaroxaban (XARELTO STARTER PACK) 15 & 20 MG TBPK Take as directed on package: Start with one 15mg  tablet by mouth twice a day with food. On Day 22, switch to one 20mg  tablet once a day with food. Patient not taking: Reported on 10/11/2014 09/05/14   Tomi Bamberger  Vann, DO  traMADol (ULTRAM) 50 MG tablet Take 1 tablet (50 mg total) by mouth every 6 (six) hours as needed for moderate pain. Patient not taking: Reported on 10/06/2014 09/28/14   Posey Boyer, MD    Physical Exam: Filed Vitals:   10/24/14 0430 10/24/14 0445 10/24/14 0500 10/24/14 0515  BP: 144/72 130/62 148/66 147/64  Pulse: 76 79 72 73  Temp:      TempSrc:      Resp:      Height:      Weight:      SpO2: 100% 100% 100% 100%   General: Not in acute distress. Dry mucus and membrane  HEENT:       Eyes: PERRL, EOMI, no scleral icterus.       ENT: No discharge from the ears and nose, no pharynx injection, no tonsillar enlargement.        Neck: No JVD, no bruit, no mass felt. Heme: No neck lymph node enlargement. Cardiac: S1/S2, RRR, No murmurs, No gallops or rubs. Pulm: No rales, wheezing, rhonchi or rubs. Abd: Soft, nondistended, mild tenderness over periumbilical area, no rebound pain, no organomegaly, BS present. Ext: No pitting leg edema bilaterally. 2+DP/PT pulse bilaterally. Musculoskeletal: No joint deformities, No joint redness or warmth, no limitation of ROM in spin. Skin: No  rashes.  Neuro: Alert, oriented X3, cranial nerves II-XII grossly intact, muscle strength 5/5 in all extremities, sensation to light touch intact.  Psych: Patient is not psychotic, no suicidal or hemocidal ideation.  Labs on Admission:  Basic Metabolic Panel:  Recent Labs Lab 10/18/14 2135 10/23/14 2101  NA 134* 136  K 4.3 4.2  CL 94* 93*  CO2 30 27  GLUCOSE 350* 251*  BUN <5* 8  CREATININE 1.20* 1.09*  CALCIUM 9.2 9.7   Liver Function Tests:  Recent Labs Lab 10/23/14 2101  AST 29  ALT 17  ALKPHOS 137*  BILITOT 2.0*  PROT 7.9  ALBUMIN 4.0   No results for input(s): LIPASE, AMYLASE in the last 168 hours. No results for input(s): AMMONIA in the last 168 hours. CBC:  Recent Labs Lab 10/18/14 2135 10/23/14 2101  WBC 11.3* 15.9*  NEUTROABS  --  12.6*  HGB 12.8 14.7  HCT 38.8 42.0  MCV 96.5 94.0  PLT 410* 480*   Cardiac Enzymes: No results for input(s): CKTOTAL, CKMB, CKMBINDEX, TROPONINI in the last 168 hours.  BNP (last 3 results)  Recent Labs  03/26/14 1531 09/19/14 2057  BNP 27.9 9.1    ProBNP (last 3 results) No results for input(s): PROBNP in the last 8760 hours.  CBG:  Recent Labs Lab 10/19/14 0139 10/19/14 0340  GLUCAP 51* 121*    Radiological Exams on Admission: No results found.  EKG:  Not done in ED, will get one.   Assessment/Plan Principal Problem:   Nausea vomiting and diarrhea Active Problems:   DM type 1 (diabetes mellitus, type 1) (HCC)   Diabetic retinopathy (HCC)   Major depression (HCC)   GAD (generalized anxiety disorder)   PTSD (post-traumatic stress disorder)   Abdominal pain   Abnormal LFTs   Hx pulmonary embolism   DKA (diabetic ketoacidoses) (HCC)   HLD (hyperlipidemia)  Nausea, vomiting, diarrhea and AP: Etiology is not clear. Most likely due to viral gastroenteritis, but needs to rule out other possibilities, such as C. difficile colitis given recent antibiotics use. She has leukocytosis with WBC 15.9.  She does not meet criteria for sepsis.  - will admit to tele  bed - npo  - prn morphine for pain and Zofran for nausea - Will check C diff pcr - Blood culture x 2 - will get Procalcitonin and trend lactic acid level - IVF: 3.0 L of NS bolus in ED, followed by 125 cc/h - Pepcid IV 20 mg bid.  - check FOBT  Hx of PE: on Xarelto. She has mild cough, shortness of breath and chest pain. CTA on 09/19/14 showed resolution of the subsegmental right upper lobe pulmonary embolism since 09/03/2014. Since pt may have vomited blood, I discussed with pt about possibly holding her Xarelto today, but pt refused. She is very concerned that she may develop PE again. She would rather to be transfused if her Hgb drops significantly.   - Will continue Xarelto now while treating her with zofran for nausea and vomiting. If she still can not tolerate oral meds, may switch to IV heparin - check trop x 1 given mild CP - cbc q8h  DM-I and mild DKA: Last A1c 8.5 on 10/11/13, poorly controled. Patient is taking Lantus and novolog at home. Patient presents with mildly DKA with elevated anion gap 16 and positive ketones in urinalysis. - continue lantus 26 units daily.  - SSI - IVF: 3L of NS and then 125 cc/h - Check A1c - BMP in AM.   Depression and anxiety: Stable, no suicidal or homicidal ideations. -Continue home medications: Klonopin, Prozac -IV Ativan when necessary in case she cannot tolerate medications orally  Abnormal LFTs: Patient has elevated total bilirubin 2.0. Likely due to severe nausea, vomiting. She is s/p of cholecystectomy. -check lipase and direct BR -f/u by CMP   HLD: Last LDL was 150 on 09/03/14. Patient reports that she has not been taking her Crestor consistently. -Continue home medications: crestor -Did counseling about importance of medication compliance.  DVT ppx: on Xarelto  Code Status: Full code Family Communication: None at bed side.  Disposition Plan: Admit to  inpatient   Date of Service 10/24/2014    Ivor Costa [facility] Pager [phone]  If 7PM-7AM, please contact night-coverage [email or web address] Password [email or web address] 10/24/2014, 5:41 AM

## 2014-10-24 NOTE — Progress Notes (Signed)
[facility] Progress Note   CHELLY DOMBECK  JEH:[id number]  DOB: Oct 28, 1970  DOA: 10/23/2014 PCP: [name], MD  Brief narrative: Maureen [name] is a 44 y.o. female with PMH of DM on insulin, HLP and recent PE on Xarelto who presents to the hospital with vomiting and diarrhea which started on the morning of admission.   Subjective: Longer feeling nauseated. Last vomited last night. Last bowel movement was last night. No abdominal pain. No cough or shortness of breath.  Assessment/Plan: Principal Problem:   Nausea vomiting and diarrhea/leukocytosis -Gastroenteritis? C. difficile colitis? Lipase normal -She has had no further stools, no stool studies can be sent  -Start clear liquids and monitor clinically -Abdomen is nontender-hold off on CT at this point -Continue IV fluids  Active Problems: Elevated bilirubin -Mildly elevated bilirubin-she's had a cholecystectomy -no right upper quadrant tenderness -AST ALT are normal -Continue to follow    DM type insulin-requiring/mild DKA - Resume Lantus and NovoLog  History of PE -Patient had a subsegmental PE in the right upper lobe noted on imaging on 8/15 which was thought to pinprick broke after a long car ride- oh DVT was found at that time -CTA on 8/31 showed resolution of pulmonary embolism that was present on 8/15 -We will discontinue Xarelto    Major depression/  GAD (generalized anxiety disorder) -Continue Prozac and clonazepam  Hyperlipidemia -Crestor   Code Status:     Code Status Orders        Start     Ordered   10/24/14 0451  Full code   Continuous     10/24/14 0452     Family Communication:  Disposition Plan: Advance diet as tolerated  DVT prophylaxis: Lovenox  Consultants: Procedures:  Antibiotics: Anti-infectives    None      Objective: Filed Weights   10/23/14 2031 10/24/14 0638  Weight: 71.215 kg (157 lb) 72.394 kg (159 lb 9.6 oz)    Intake/Output Summary (Last 24  hours) at 10/24/14 1546 Last data filed at 10/24/14 1051  Gross per 24 hour  Intake 3578.33 ml  Output   1750 ml  Net 1828.33 ml     Vitals Filed Vitals:   10/24/14 0515 10/24/14 0638 10/24/14 0641 10/24/14 1318  BP: 147/64  154/67 123/58  Pulse: 73  75 78  Temp:   98.5 F (36.9 C) 99 F (37.2 C)  TempSrc:   Oral Oral  Resp:   13 18  Height:  5\' 4"  (1.626 m)    Weight:  72.394 kg (159 lb 9.6 oz)    SpO2: 100%  99% 100%    Exam:  General:  Pt is alert, not in acute distress  HEENT: No icterus, No thrush, oral mucosa moist  Cardiovascular: regular rate and rhythm, S1/S2 No murmur  Respiratory: clear to auscultation bilaterally   Abdomen: Soft, +Bowel sounds, non tender, non distended, no guarding  MSK: No LE edema, cyanosis or clubbing  Data Reviewed: Basic Metabolic Panel:  Recent Labs Lab 10/18/14 2135 10/23/14 2101 10/24/14 0900  NA 134* 136 134*  K 4.3 4.2 3.7  CL 94* 93* 95*  CO2 30 27 25   GLUCOSE 350* 251* 417*  BUN <5* 8 6  CREATININE 1.20* 1.09* 0.97  CALCIUM 9.2 9.7 8.3*   Liver Function Tests:  Recent Labs Lab 10/23/14 2101 10/24/14 0900  AST 29 17  ALT 17 10*  ALKPHOS 137* 113  BILITOT 2.0* 1.6*  PROT 7.9 6.8  ALBUMIN 4.0 3.2*  Recent Labs Lab 10/24/14 0900  LIPASE 19*   No results for input(s): AMMONIA in the last 168 hours. CBC:  Recent Labs Lab 10/18/14 2135 10/23/14 2101 10/24/14 0900 10/24/14 1335  WBC 11.3* 15.9* 16.4* 21.6*  NEUTROABS  --  12.6*  --   --   HGB 12.8 14.7 12.5 12.8  HCT 38.8 42.0 37.2 37.6  MCV 96.5 94.0 94.4 94.9  PLT 410* 480* 440* 441*   Cardiac Enzymes:  Recent Labs Lab 10/24/14 0900  TROPONINI <0.03   BNP (last 3 results)  Recent Labs  03/26/14 1531 09/19/14 2057  BNP 27.9 9.1    ProBNP (last 3 results) No results for input(s): PROBNP in the last 8760 hours.  CBG:  Recent Labs Lab 10/19/14 0139 10/19/14 0340 10/24/14 0649 10/24/14 0805 10/24/14 1159  GLUCAP 51*  121* 287* 334* 165*    No results found for this or any previous visit (from the past 240 hour(s)).   Studies: No results found.  Scheduled Meds:  Scheduled Meds: . aspirin EC  81 mg Oral Q breakfast  . famotidine (PEPCID) IV  20 mg Intravenous Q12H  . FLUoxetine  40 mg Oral Daily  . insulin aspart  0-9 Units Subcutaneous TID WC  . insulin glargine  26 Units Subcutaneous Daily  . rivaroxaban  20 mg Oral Q supper  . rosuvastatin  40 mg Oral Daily  . sodium chloride  3 mL Intravenous Q12H   Continuous Infusions: . sodium chloride 125 mL/hr at 10/24/14 1546    Time spent on care of this patient: 35 min   Sibley, MD 10/24/2014, 3:46 PM  LOS: 0 days   [facility] Office  [phone] Pager - Text Page per [email or web address] If 7PM-7AM, please contact night-coverage [email or web address]

## 2014-10-24 NOTE — Discharge Instructions (Addendum)

## 2014-10-24 NOTE — Care Management Note (Signed)
Case Management Note  Patient Details  Name: Maureen [name] MRN: [id number] Date of Birth: 1970-09-17  Subjective/Objective:                  Date-10-24-14 Wednesday Initial Assessment  Spoke with patient at the bedside.  Introduced self as Tourist information centre manager and explained role in discharge planning and how to be reached.  Verified patient lives at [address] [place] [address], with 2 teenage sons.  Verified patient anticipates to go home with family,  at time of discharge and will have part-time supervision by family  at this time to best of their knowledge.  Patient has no DME. Expressed potential need for no other DME.  Patient denied   needing help with their medication.  Patient drives to MD appointments.  Verified patient has PCP. Patient states they currently receive [place] services through no one.    Plan: CM will continue to follow for discharge planning and [facility] resources.   Carles Collet RN BSN CM [phone]   Action/Plan:   Expected Discharge Date:                  Expected Discharge Plan:  Home/Self Care  In-House Referral:     Discharge planning Services  CM Consult  Post Acute Care Choice:    Choice offered to:     DME Arranged:    DME Agency:     HH Arranged:    HH Agency:     Status of Service:  In process, will continue to follow  Medicare Important Message Given:    Date Medicare IM Given:    Medicare IM give by:    Date Additional Medicare IM Given:    Additional Medicare Important Message give by:     If discussed at [place] of Stay Meetings, dates discussed:    Additional Comments:  Carles Collet, RN 10/24/2014, 11:05 AM

## 2014-10-24 NOTE — Progress Notes (Signed)
Report received from ER.

## 2014-10-24 NOTE — Progress Notes (Signed)
MD Rizwan paged, blood bank called, patient is ABO/RH B Positive.

## 2014-10-25 DIAGNOSIS — F4310 Post-traumatic stress disorder, unspecified: Secondary | ICD-10-CM

## 2014-10-25 LAB — URINALYSIS, ROUTINE W REFLEX MICROSCOPIC
Bilirubin Urine: NEGATIVE
Glucose, UA: >1000 mg/dL — AB
Hgb urine dipstick: NEGATIVE
Ketones, ur: NEGATIVE mg/dL
Leukocytes, UA: NEGATIVE
Nitrite: NEGATIVE
Protein, ur: NEGATIVE mg/dL
Specific Gravity, Urine: 1.01 (ref 1.005–1.030)
Urobilinogen, UA: 0.2 mg/dL (ref 0.0–1.0)
pH: 7 (ref 5.0–8.0)

## 2014-10-25 LAB — CBC
HCT: 37.7 % (ref 36.0–46.0)
HCT: 39.5 % (ref 36.0–46.0)
Hemoglobin: 12.5 g/dL (ref 12.0–15.0)
Hemoglobin: 13.3 g/dL (ref 12.0–15.0)
MCH: 31.7 pg (ref 26.0–34.0)
MCH: 32 pg (ref 26.0–34.0)
MCHC: 33.2 g/dL (ref 30.0–36.0)
MCHC: 33.7 g/dL (ref 30.0–36.0)
MCV: 95.7 fL (ref 78.0–100.0)
MCV: 95.2 fL (ref 78.0–100.0)
Platelets: 443 10*3/uL — ABNORMAL HIGH (ref 150–400)
Platelets: 464 10*3/uL — ABNORMAL HIGH (ref 150–400)
RBC: 3.94 MIL/uL (ref 3.87–5.11)
RBC: 4.15 MIL/uL (ref 3.87–5.11)
RDW: 13.1 % (ref 11.5–15.5)
RDW: 13.1 % (ref 11.5–15.5)
WBC: 11.5 10*3/uL — ABNORMAL HIGH (ref 4.0–10.5)
WBC: 12.1 10*3/uL — ABNORMAL HIGH (ref 4.0–10.5)

## 2014-10-25 LAB — GLUCOSE, CAPILLARY
Glucose-Capillary: 267 mg/dL — ABNORMAL HIGH (ref 65–99)
Glucose-Capillary: 123 mg/dL — ABNORMAL HIGH (ref 65–99)
Glucose-Capillary: 137 mg/dL — ABNORMAL HIGH (ref 65–99)

## 2014-10-25 LAB — URINE MICROSCOPIC-ADD ON

## 2014-10-25 LAB — HEMOGLOBIN A1C
Hgb A1c MFr Bld: 10.7 % — ABNORMAL HIGH (ref 4.8–5.6)
Mean Plasma Glucose: 260 mg/dL

## 2014-10-25 LAB — OCCULT BLOOD X 1 CARD TO LAB, STOOL: Fecal Occult Bld: POSITIVE — AB

## 2014-10-25 MED ORDER — FENTANYL CITRATE (PF) 100 MCG/2ML IJ SOLN
12.5000 ug | INTRAMUSCULAR | Status: DC | PRN
  Administered 2014-10-25 (×3): 12.5 ug via INTRAVENOUS
  Filled 2014-10-25 (×2): qty 2

## 2014-10-25 MED ORDER — INSULIN GLARGINE 100 UNIT/ML ~~LOC~~ SOLN: 30.0000 [IU] | Freq: Every day | SUBCUTANEOUS | Status: DC

## 2014-10-25 MED ORDER — INSULIN ASPART 100 UNIT/ML ~~LOC~~ SOLN
0.0000 [IU] | Freq: Three times a day (TID) | SUBCUTANEOUS | Status: DC
  Administered 2014-10-25: 2 [IU] via SUBCUTANEOUS
  Administered 2014-10-25: 8 [IU] via SUBCUTANEOUS
  Administered 2014-10-25: 2 [IU] via SUBCUTANEOUS

## 2014-10-25 MED ORDER — INSULIN ASPART 100 UNIT/ML ~~LOC~~ SOLN: 0.0000 [IU] | Freq: Every day | SUBCUTANEOUS | Status: DC

## 2014-10-25 MED ORDER — HYDROCODONE-ACETAMINOPHEN 5-325 MG PO TABS: 1.0000 | ORAL_TABLET | Freq: Four times a day (QID) | ORAL | Status: DC | PRN

## 2014-10-25 MED ORDER — INSULIN GLARGINE 100 UNIT/ML ~~LOC~~ SOLN
26.0000 [IU] | Freq: Every day | SUBCUTANEOUS | Status: DC
  Administered 2014-10-25: 26 [IU] via SUBCUTANEOUS
  Filled 2014-10-25: qty 0.26

## 2014-10-25 NOTE — Progress Notes (Signed)
Pharmacist Provided - Patient Medication Education Prior to Discharge   Maureen [name] is an 44 y.o. female who presented to [facility] on 10/23/2014 with a chief complaint of  Chief Complaint  Patient presents with  . Hematemesis     [x]  Patient being discharged without any new medications  The following medications were discussed with the patient: all discharge medications  Pain Control medications: []  Yes    []  No  Diabetes Medications: [x]  Yes    []  No  Heart Failure Medications: []  Yes    []  No  Anticoagulation Medications:  [x]  Yes    []  No  Antibiotics at discharge: []  Yes    []  No  Medication Adherence Assessment: []  Excellent (no doses missed/week)      [x]  Good (1 dose missed/week)      []  Partial (2-3 doses missed/week)      []  Poor (>3 doses missed/week)  Barriers to Obtaining Medications: []  Yes [x]  No  Assessment: Spoke with patient prior to discharge and addressed her medications concerns. Most of her concerns were around the Xarelto and bleeding risk. Per MD note pt is to hold her ASA. Explained to patient that this will help decrease her bleeding risk. Also reviewed management of hypoglycemic events with the patient as she is a diabetic.   Time spent preparing for discharge counseling: 34mins Time spent counseling patient: 55mins  Darl Pikes, PharmD Clinical Pharmacist- Resident Pager: [phone]  Darl Pikes, PharmD 10/25/2014, 6:05 PM

## 2014-10-25 NOTE — Progress Notes (Signed)
Positive occult stool sent to [facility] NP on call

## 2014-10-25 NOTE — Progress Notes (Signed)
Patient given discharge instruction verbalize understanding.  Ambulating to discharge area with son and staff.

## 2014-10-25 NOTE — Discharge Summary (Signed)
Physician Discharge Summary  Maureen [name] OVZ:[id number] DOB: 02/20/1970 DOA: 10/23/2014  PCP: [name], MD  Admit date: 10/23/2014 Discharge date: 10/25/2014  Time spent: 60 minutes    Discharge Condition: stable   Discharge Diagnoses:  Principal Problem:   Nausea vomiting and diarrhea Active Problems:   DM type 1 (diabetes mellitus, type 1) (HCC)   Diabetic retinopathy (HCC)   Major depression (HCC)   GAD (generalized anxiety disorder)   PTSD (post-traumatic stress disorder)   Abdominal pain   Abnormal LFTs   Hx pulmonary embolism   DKA (diabetic ketoacidoses) (HCC)   HLD (hyperlipidemia)   History of present illness:  Maureen [name] is a 44 y.o. female with PMH of DM on insulin, HLP and recent PE on Xarelto who presents to the hospital with vomiting, abdominal pain and diarrhea which started on the morning of admission. She admitted to having 5 nonbloody bowel movements, vomited about 6-7 times and had periumbilical pain.,   Hospital Course:  Principal Problem:  Nausea vomiting and diarrhea/leukocytosis -Gastroenteritis? C. difficile PCR negative. Lipase normal -Abdomen is nontender on the day after admission- CT not needed - leukocytosis resolving without antibiotics - diet slowly advanced and she continues to tolerate it-  -tolerating solid food today - she had a small amount of blood mixed with her stool this AM which is not surprising with an enteritis in the setting og Xarelto use-  has had a subsequent non-bloody BM- she takes ASA for prophylaxis (no h/o CVA/ MI) - have asked her to hold it for a few days  Active Problems: Elevated bilirubin -Mildly elevated bilirubin-she's had a cholecystectomy -no right upper quadrant tenderness -AST ALT are normal    DM type insulin-requiring/mild DKA - Resumed Lantus and NovoLog- will need to titrate up doses as A1c is 10 but currently she is not eating well and therefore will hold off on increasing for  now  History of PE -Patient had a subsegmental PE in the right upper lobe noted on imaging on 8/15 which was thought to pinprick broke after a long car ride- oh DVT was found at that time -CTA on 8/31 showed resolution of pulmonary embolism that was present on 8/15 -continue Xarelto for minimum of 3-4 mo   Major depression/ GAD (generalized anxiety disorder) -Continue Prozac and clonazepam  Hyperlipidemia -Crestor   Discharge Exam: Filed Weights   10/23/14 2031 10/24/14 4128  Weight: 71.215 kg (157 lb) 72.394 kg (159 lb 9.6 oz)   Filed Vitals:   10/25/14 1447  BP: 139/65  Pulse: 68  Temp: 98.5 F (36.9 C)  Resp: 18    General: AAO x 3, no distress Cardiovascular: RRR, no murmurs  Respiratory: clear to auscultation bilaterally GI: soft, non-tender, non-distended, bowel sound positive  Discharge Instructions You were cared for by a hospitalist during your hospital stay. If you have any questions about your discharge medications or the care you received while you were in the hospital after you are discharged, you can call the unit and asked to speak with the hospitalist on call if the hospitalist that took care of you is not available. Once you are discharged, your primary care physician will handle any further medical issues. Please note that NO REFILLS for any discharge medications will be authorized once you are discharged, as it is imperative that you return to your primary care physician (or establish a relationship with a primary care physician if you do not have one) for your aftercare needs so that they  can reassess your need for medications and monitor your lab values.  Discharge Instructions    Discharge instructions    Complete by:  As directed   Soft low fiber diet     Increase activity slowly    Complete by:  As directed             Medication List    STOP taking these medications        aspirin EC 81 MG tablet      TAKE these medications         clonazePAM 0.5 MG tablet  Commonly known as:  KLONOPIN  Take one twice daily as needed for anxiety and itching     FLUoxetine 40 MG capsule  Commonly known as:  PROZAC  Take 1 capsule (40 mg total) by mouth daily. For depression     HYDROcodone-acetaminophen 5-325 MG tablet  Commonly known as:  NORCO/VICODIN  Take 1 tablet by mouth every 6 (six) hours as needed for moderate pain.     insulin aspart 100 UNIT/ML injection  Commonly known as:  novoLOG  Inject 8 Units into the skin 3 (three) times daily with meals.     insulin glargine 100 UNIT/ML injection  Commonly known as:  LANTUS  Inject 26 Units into the skin at bedtime.     pantoprazole 40 MG tablet  Commonly known as:  PROTONIX  Take 1 tablet (40 mg total) by mouth daily.     rivaroxaban 20 MG Tabs tablet  Commonly known as:  XARELTO  Take 20 mg by mouth daily with supper.       Allergies  Allergen Reactions  . Cephalexin Rash    Pt was admitted to the hospital upon taking.  . Ibuprofen Nausea And Vomiting and Rash  . Meloxicam Nausea Only      The results of significant diagnostics from this hospitalization (including imaging, microbiology, ancillary and laboratory) are listed below for reference.    Significant Diagnostic Studies: Dg Chest 2 View  10/18/2014   CLINICAL DATA:  Chest pain. Diagnosed with PE and pneumonia in the past 2 weeks  EXAM: CHEST  2 VIEW  COMPARISON:  Seven days ago  FINDINGS: Good but partial clearance of right upper lobe opacity. There is no edema, effusion, or pneumothorax. Normal heart size and mediastinal contours. Cholecystectomy. No osseous findings to explain chest pain.  IMPRESSION: 1. No acute finding to explain new chest pain. 2. Near complete clearing of right upper lobe opacity/pneumonia. Additional radiograph recommended in 3-4 weeks.   Electronically Signed   By: Monte Fantasia M.D.   On: 10/18/2014 21:53   Dg Chest 2 View  10/11/2014   CLINICAL DATA:  Chest pain and shortness  of breath for 1 day. Recent history of a pulmonary embolism.  EXAM: CHEST  2 VIEW  COMPARISON:  10/01/2014  FINDINGS: There is patchy consolidation in the posterior right upper lobe, new from the prior exam. Remainder of the lungs is clear. No pleural effusion or pneumothorax.  Normal heart, mediastinum and hila. Skeletal structures are unremarkable.  IMPRESSION: 1. Right upper lobe consolidation consistent with pneumonia in the proper clinical setting.   Electronically Signed   By: Lajean Manes M.D.   On: 10/11/2014 21:58   Dg Chest 2 View  10/01/2014   CLINICAL DATA:  45 year old female with chest pain  EXAM: CHEST  2 VIEW  COMPARISON:  Radiograph dated 09/28/2014  FINDINGS: The heart size and mediastinal contours are within normal limits.  Both lungs are clear. The visualized skeletal structures are unremarkable.  IMPRESSION: No active cardiopulmonary disease.   Electronically Signed   By: Anner Crete M.D.   On: 10/01/2014 02:57   Dg Chest 2 View  09/28/2014   CLINICAL DATA:  44 year old female the history of pulmonary embolism presenting with chest pain  EXAM: CHEST  2 VIEW  COMPARISON:  Chest CT dated 09/19/2014  FINDINGS: The heart size and mediastinal contours are within normal limits. Both lungs are clear. The visualized skeletal structures are unremarkable.  IMPRESSION: No active cardiopulmonary disease.   Electronically Signed   By: Anner Crete M.D.   On: 09/28/2014 18:15    Microbiology: Recent Results (from the past 240 hour(s))  Culture, blood (x 2)     Status: None (Preliminary result)   Collection Time: 10/24/14  9:00 AM  Result Value Ref Range Status   Specimen Description BLOOD LEFT FOOT  Final   Special Requests BOTTLES DRAWN AEROBIC ONLY 3CC  Final   Culture NO GROWTH 1 DAY  Final   Report Status PENDING  Incomplete  Culture, blood (x 2)     Status: None (Preliminary result)   Collection Time: 10/24/14  9:23 AM  Result Value Ref Range Status   Specimen Description  BLOOD RIGHT FOOT  Final   Special Requests BOTTLES DRAWN AEROBIC ONLY 3CC  Final   Culture NO GROWTH 1 DAY  Final   Report Status PENDING  Incomplete  C difficile quick scan w PCR reflex     Status: None   Collection Time: 10/24/14 10:24 PM  Result Value Ref Range Status   C Diff antigen NEGATIVE NEGATIVE Final   C Diff toxin NEGATIVE NEGATIVE Final   C Diff interpretation Negative for toxigenic C. difficile  Final     Labs: Basic Metabolic Panel:  Recent Labs Lab 10/18/14 2135 10/23/14 2101 10/24/14 0900  NA 134* 136 134*  K 4.3 4.2 3.7  CL 94* 93* 95*  CO2 30 27 25   GLUCOSE 350* 251* 417*  BUN <5* 8 6  CREATININE 1.20* 1.09* 0.97  CALCIUM 9.2 9.7 8.3*   Liver Function Tests:  Recent Labs Lab 10/23/14 2101 10/24/14 0900  AST 29 17  ALT 17 10*  ALKPHOS 137* 113  BILITOT 2.0* 1.6*  PROT 7.9 6.8  ALBUMIN 4.0 3.2*    Recent Labs Lab 10/24/14 0900  LIPASE 19*   No results for input(s): AMMONIA in the last 168 hours. CBC:  Recent Labs Lab 10/23/14 2101 10/24/14 0900 10/24/14 1335 10/24/14 2038 10/25/14 0526 10/25/14 1259  WBC 15.9* 16.4* 21.6* 16.1* 11.5* 12.1*  NEUTROABS 12.6*  --   --   --   --   --   HGB 14.7 12.5 12.8 12.2 12.5 13.3  HCT 42.0 37.2 37.6 36.3 37.7 39.5  MCV 94.0 94.4 94.9 95.5 95.7 95.2  PLT 480* 440* 441* 466* 443* 464*   Cardiac Enzymes:  Recent Labs Lab 10/24/14 0900  TROPONINI <0.03   BNP: BNP (last 3 results)  Recent Labs  03/26/14 1531 09/19/14 2057  BNP 27.9 9.1    ProBNP (last 3 results) No results for input(s): PROBNP in the last 8760 hours.  CBG:  Recent Labs Lab 10/24/14 1159 10/24/14 1700 10/24/14 2235 10/25/14 0753 10/25/14 1210  GLUCAP 165* 112* 229* 267* 123*       SignedDebbe Odea, MD [facility] 10/25/2014, 3:14 PM

## 2014-10-25 NOTE — Progress Notes (Signed)
Inpatient Diabetes Program Recommendations  AACE/ADA: New Consensus Statement on Inpatient Glycemic Control (2015)  Target Ranges:  Prepandial:   less than 140 mg/dL      Peak postprandial:   less than 180 mg/dL (1-2 hours)      Critically ill patients:  140 - 180 mg/dL   Review of Glycemic Control  Diabetes history: DM1 Outpatient Diabetes medications: Lantus 26 units QHS, Novolog 8 units TID meal coverage. Current orders for Inpatient glycemic control: Lantus 26 units Daily, Novolog Moderate + HS scales  Inpatient Diabetes Program Recommendations: Insulin - Basal: Fasting glucose is 267 this am. Please conisder increasing basal insulin to Lantus 30 units Daily.   Thanks,  Tama Headings RN, MSN, [facility] Inpatient Diabetes Coordinator Team Pager [phone] (8a-5p)

## 2014-10-29 LAB — GI PATHOGEN PANEL BY PCR, STOOL
C difficile toxin A/B: NOT DETECTED
Campylobacter by PCR: NOT DETECTED
Cryptosporidium by PCR: NOT DETECTED
E coli (ETEC) LT/ST: NOT DETECTED
E coli (STEC): NOT DETECTED
E coli 0157 by PCR: NOT DETECTED
G lamblia by PCR: NOT DETECTED
Norovirus GI/GII: NOT DETECTED
Rotavirus A by PCR: NOT DETECTED
Salmonella by PCR: NOT DETECTED
Shigella by PCR: NOT DETECTED

## 2014-10-29 LAB — CULTURE, BLOOD (ROUTINE X 2)
Culture: NO GROWTH
Culture: NO GROWTH

## 2014-10-30 IMAGING — CT CT ABD-PELV W/ CM
2 of 4 series · 10 of 36 positions shown, 17 images · IV contrast (READICAT/WATER & [ID] OMNI 300)
Comparison: CT abdomen and pelvis September 13, 2012; renal ultrasound
December 12, 2012

CLINICAL DATA: Abdominal pain with nausea ; elevated liver enzymes

EXAM:
CT ABDOMEN AND PELVIS WITH CONTRAST
TECHNIQUE: Multidetector CT imaging of the abdomen and pelvis was performed
using the standard protocol following bolus administration of
intravenous contrast. Oral contrast was also administered.
CONTRAST:  100mL OMNIPAQUE IOHEXOL 300 MG/ML  SOLN

[Series 3: abd/pelvis with · axial · 0.70mm/px · z∈[-339,+1]mm · 9 of 84 slices shown, 15 images]
[im 9/84  soft-tissue]
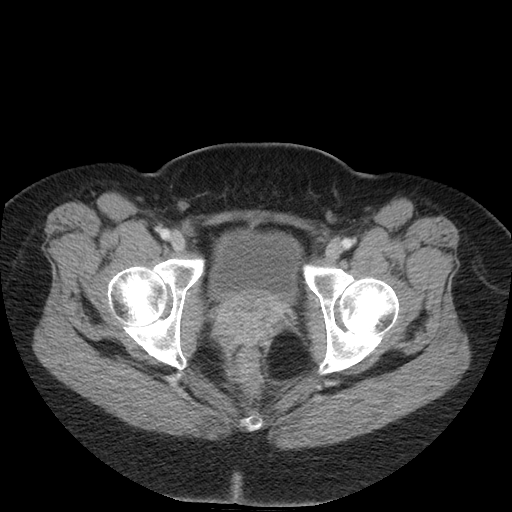
[im 9/84  bone]
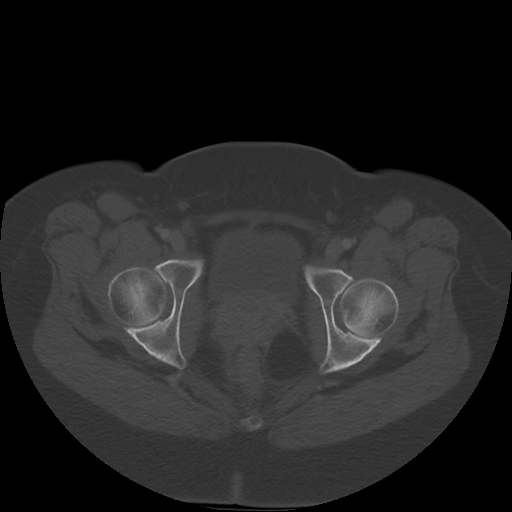
[im 17/84  soft-tissue]
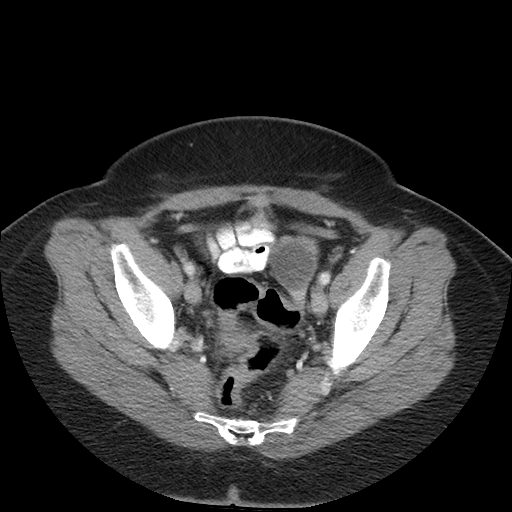
[im 25/84  soft-tissue]
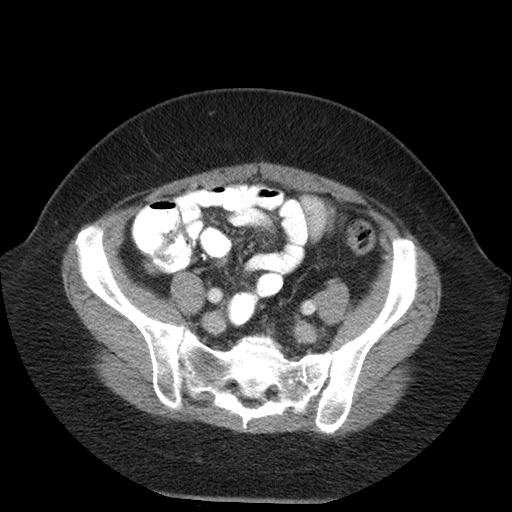
[im 34/84  soft-tissue]
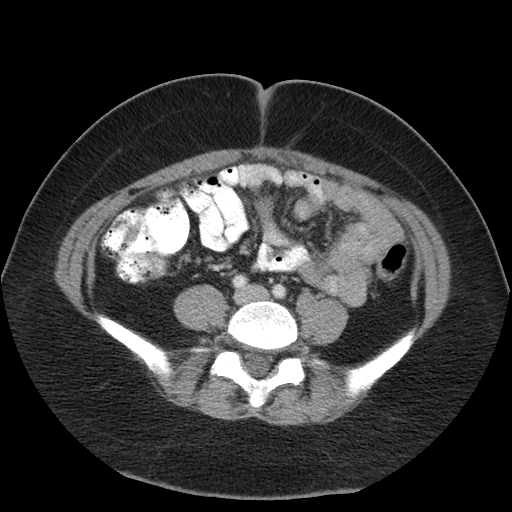
[im 42/84  soft-tissue]
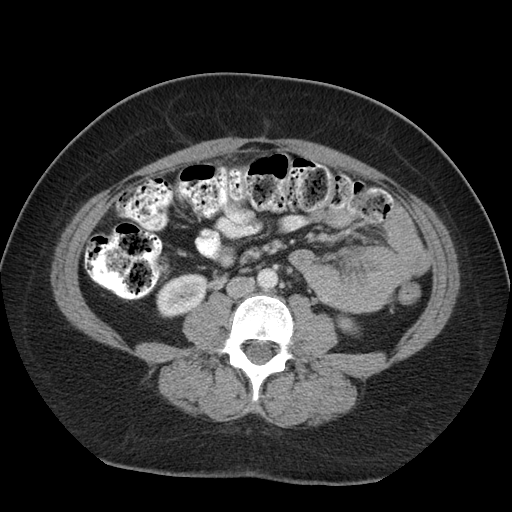
[im 50/84  soft-tissue]
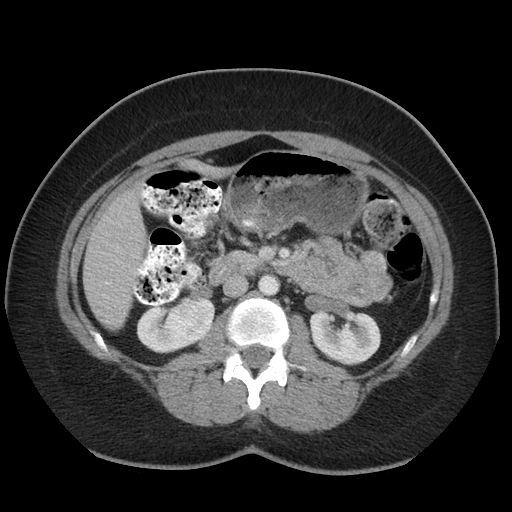
[im 50/84  lung]
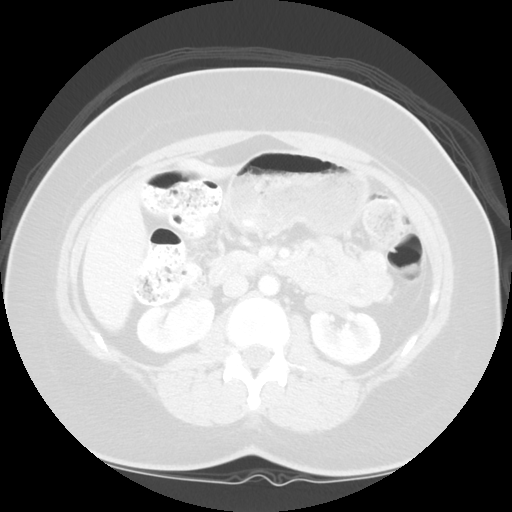
[im 59/84  soft-tissue]
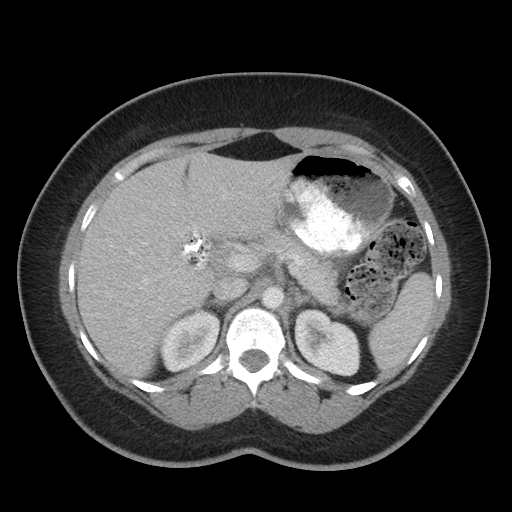
[im 59/84  lung]
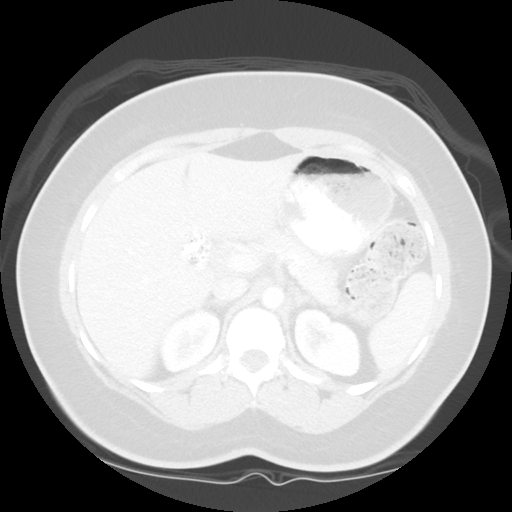
[im 67/84  soft-tissue]
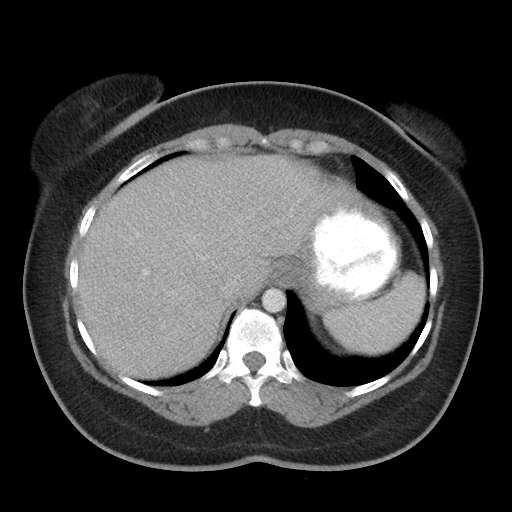
[im 67/84  lung]
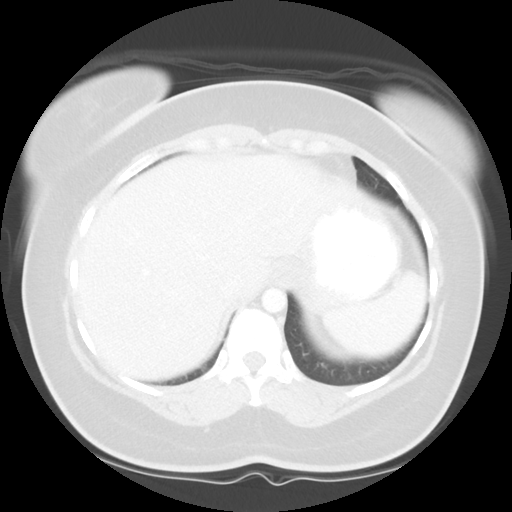
[im 75/84  soft-tissue]
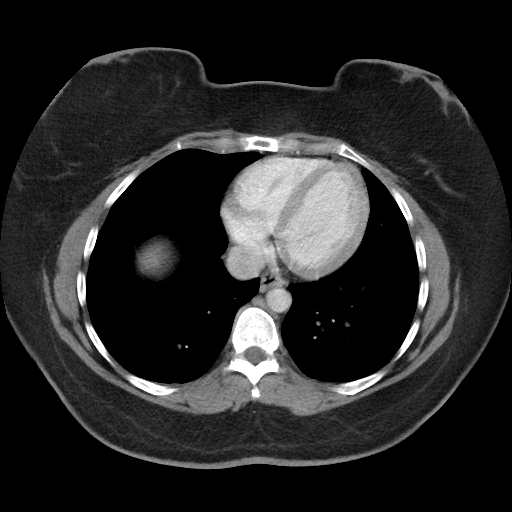
[im 75/84  lung]
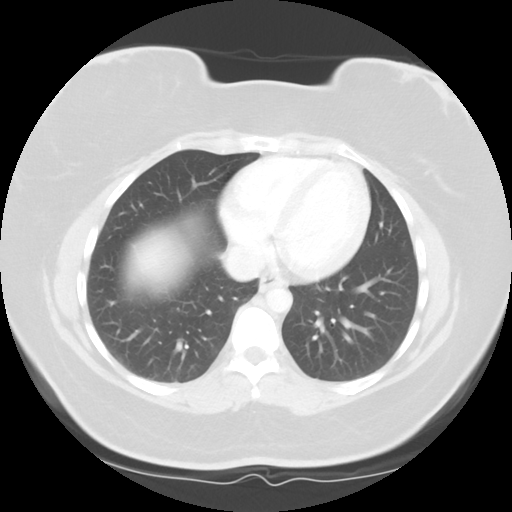
[im 75/84  bone]
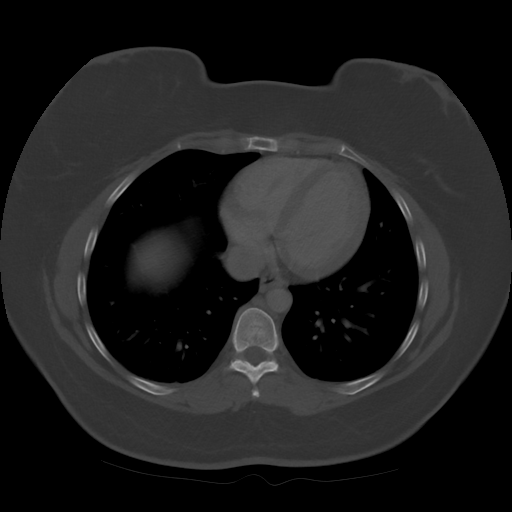

[Series 601: coronal body · coronal · 0.89mm/px · 1 of 133 slices shown, 2 images]
[im 45/133  soft-tissue]
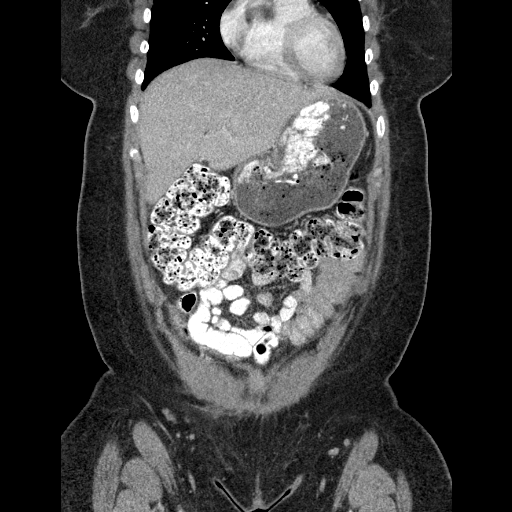
[im 45/133  bone]
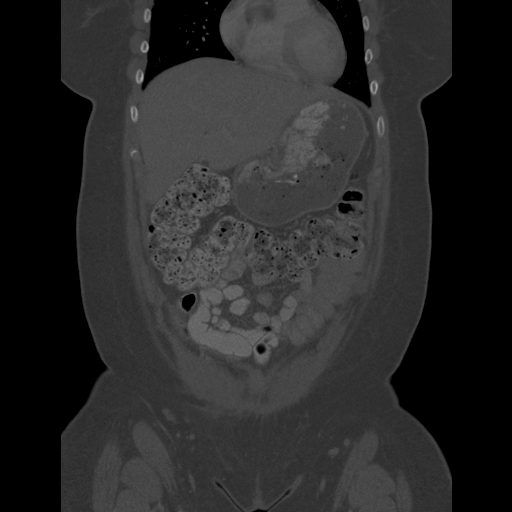

[10 of 36 positions shown; findings below may reference images not displayed]

FINDINGS: Lung bases are clear.

Liver is prominent, measuring 17.0 cm in length. No focal liver
lesions are identified. Gallbladder is absent. There is no
appreciable biliary duct dilatation.

Spleen, pancreas, and adrenals appear normal. Kidneys bilaterally
appear mildly malrotated. There is no renal mass or hydronephrosis
on either side. There are extrarenal pelves on each side, an
anatomic variant. There is a small calcification just lateral to the
left renal vein in the medial left kidney, a stable finding that is
felt to have vascular etiology. No renal calculi are appreciable on
this study. There is no ureteral calculus or ureterectasis apparent
on either side.

In the pelvis, the uterus is absent. There is a 3.4 x 3.2 cm left
ovarian cyst. No other pelvic mass is identified. Appendix and right
ovary are absent. The periappendiceal region shows no inflammation.
There is no pelvic fluid. The urinary bladder is only partially
distended. There is borderline fold thickening in urinary bladder.
Scarring along the anterior aspect of the bladder is a stable
finding.

There is no bowel obstruction.  No free air or portal venous air.

There is no ascites, adenopathy, or abscess in the abdomen or
pelvis. There is atherosclerotic change in the aorta but no
aneurysm. There are no blastic or lytic bone lesions.
IMPRESSION: No mesenteric inflammation or bowel obstruction.  No abscess.

Gallbladder, uterus, right ovary, and appendix are absent.

Mild urinary bladder wall thickening may be secondary to incomplete
distention. Mild cystitis cannot be excluded on this study, however.
An area of scarring along the anterior aspect of the bladder is
stable.

Kidneys are mildly malrotated. No hydronephrosis. No renal mass on
either side.

Liver is prominent but otherwise appears unremarkable.

## 2014-11-01 ENCOUNTER — Ambulatory Visit (INDEPENDENT_AMBULATORY_CARE_PROVIDER_SITE_OTHER): Payer: BLUE CROSS/BLUE SHIELD | Admitting: Family Medicine

## 2014-11-01 VITALS — BP 122/76 | HR 92 | Temp 98.7°F | Resp 16 | Ht 64.0 in | Wt 159.0 lb

## 2014-11-01 DIAGNOSIS — Z09 Encounter for follow-up examination after completed treatment for conditions other than malignant neoplasm: Secondary | ICD-10-CM | POA: Diagnosis not present

## 2014-11-01 NOTE — Patient Instructions (Signed)
Please make an appt to see me in 2-3 weeks so we can check in  If you have any change in your condition please seek care!

## 2014-11-01 NOTE — Progress Notes (Signed)
[facility] [address] [place] [address] [phone]  Date:  11/01/2014   Name:  Maureen [name]   DOB:  12/14/1970   MRN:  [id number]  PCP:  [name], MD    Chief Complaint: Follow-up   History of Present Illness:  Maureen [name] is a 44 y.o. very pleasant female patient who presents with the following:  Here today to follow-up; she was inpt 10/4 to 10/6 with GI sx and DKA. She has a history of DMI and is on xarelto for recent PE.   She was dx with a PE in August- never had this in the past.   She is s/p hysterectomy She did go on a long car ride prior to development of he DVT/ PE.   Her PE is thought to be provoked by this travel.   She is overall feeling much better now. She did notice some pain in her left calf yesterday.  Her GI symptoms are now resolved.  Her breathing is somewhat better- she still has to prop herself up to sleep at night.   She is not eating a lot because her throat is still irritated.   She is taking protonix some of the time but admits she does not take it all the time.    She is not on an insulin pump.  She sees Dr. Chalmers Cater She is able to swallow soft foods and liquids   Lab Results  Component Value Date   HGBA1C 10.7* 10/24/2014      Patient Active Problem List   Diagnosis Date Noted  . DKA (diabetic ketoacidoses) ([place]) 10/24/2014  . HLD (hyperlipidemia) 10/24/2014  . Hx pulmonary embolism 09/28/2014  . Nausea vomiting and diarrhea 09/03/2014  . Abdominal pain 09/03/2014  . Abnormal LFTs 09/03/2014  . Dizziness 09/03/2014  . Acute pulmonary embolism ([place]) 09/03/2014  . Transaminitis 09/02/2014  . GAD (generalized anxiety disorder) 11/23/2013  . PTSD (post-traumatic stress disorder) 11/23/2013  . Severe major depression without psychotic features ([place]) 10/10/2013  . Major depression ([place]) 05/22/2013  . Chest pain 05/18/2012  . Endometriosis 12/08/2011  . Arthritis of left knee 12/08/2011  .  Diabetic retinopathy ([place]) 12/08/2011  . Fibromyalgia 10/23/2011  . DM type 1 (diabetes mellitus, type 1) ([place]) 10/13/2011    Past Medical History  Diagnosis Date  . 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 Fibromyalgia   . Hyperlipidemia   . Abdominal pain   . Nausea & vomiting   . Biliary dyskinesia   . Depression   . Joint pain   . Skin rash     due to medications  . Post traumatic stress disorder (PTSD)   . Sleep trouble   . Major depressive disorder ([place])   . Cervical strain   . Closed head injury 2010  . Renal stones   . Migraine     hx of none recent  . Arthritis   . Hypoglycemia   . Anxiety   . Pulmonary embolism [facility])     Past Surgical History  Procedure Laterality Date  . Lipoma removed  yrs ago  . Knee surgery Left 2012  . Cesarean section  1997, 1999  . Cholecystectomy  02/10/2011    Procedure: LAPAROSCOPIC CHOLECYSTECTOMY WITH INTRAOPERATIVE CHOLANGIOGRAM;  Surgeon: Judieth Keens, DO;  Location: [facility];  Service: General;  Laterality: N/A;  . Robotic assisted laparoscopic lysis of adhesion N/A 03/04/2012    Procedure: ROBOTIC ASSISTED  LAPAROSCOPIC LYSIS OF EXTENSIVE ADHESIONS;  Surgeon: Claiborne Billings A. Pamala Hurry, MD;  Location: [facility];  Service: Gynecology;  Laterality: N/A;  . Tonsillectomy  2001  . Abdominal hysterectomy  2001  . Eye surgery  2006    laser eye surgery left eye  . Exploratory laparomty  Mar 04, 2012  . Laparoscopic appendectomy N/A 10/12/2012    Procedure: DIAGNOSTIC APPENDECTOMY LAPAROSCOPIC;  Surgeon: Madilyn Hook, DO;  Location: [facility];  Service: General;  Laterality: N/A;  . Appendectomy      Social History  Substance Use Topics  . Smoking status: Never Smoker   . Smokeless tobacco: Never Used  . Alcohol Use: No    Family History  Problem Relation Age of Onset  . Cancer Father     lymphoma  . Nephrolithiasis Father   . Vascular Disease Father   . Alcohol abuse Father   . Drug abuse Father    . Cancer Maternal Grandmother     colon  . Hypertension Mother   . Heart disease Mother   . Cataracts Mother   . Depression Mother   . Diabetes Brother   . Drug abuse Brother   . Mental illness Maternal Grandfather   . Cancer Maternal Grandfather   . Heart disease Paternal Grandmother   . Heart disease Paternal Grandfather   . Suicidality Maternal Uncle     Allergies  Allergen Reactions  . Cephalexin Rash    Pt was admitted to the hospital upon taking.  . Ibuprofen Nausea And Vomiting and Rash  . Meloxicam Nausea Only    Medication list has been reviewed and updated.  Current Outpatient Prescriptions on File Prior to Visit  Medication Sig Dispense Refill  . FLUoxetine (PROZAC) 40 MG capsule Take 1 capsule (40 mg total) by mouth daily. For depression 30 capsule 5  . insulin aspart (NOVOLOG) 100 UNIT/ML injection Inject 8 Units into the skin 3 (three) times daily with meals. 10 mL 11  . insulin glargine (LANTUS) 100 UNIT/ML injection Inject 26 Units into the skin at bedtime.     . pantoprazole (PROTONIX) 40 MG tablet Take 1 tablet (40 mg total) by mouth daily. 30 tablet 0  . rivaroxaban (XARELTO) 20 MG TABS tablet Take 20 mg by mouth daily with supper.    . clonazePAM (KLONOPIN) 0.5 MG tablet Take one twice daily as needed for anxiety and itching (Patient not taking: Reported on 11/01/2014) 30 tablet 1  . HYDROcodone-acetaminophen (NORCO/VICODIN) 5-325 MG tablet Take 1 tablet by mouth every 6 (six) hours as needed for moderate pain. (Patient not taking: Reported on 11/01/2014) 15 tablet 0   No current facility-administered medications on file prior to visit.    Review of Systems:  As per HPI- otherwise negative.   Physical Examination: Filed Vitals:   11/01/14 1640  BP: 122/76  Pulse: 92  Temp: 98.7 F (37.1 C)  Resp: 16   Filed Vitals:   11/01/14 1640  Height: 5\' 4"  (1.626 m)  Weight: 159 lb (72.122 kg)   Body mass index is 27.28 kg/(m^2). Ideal Body Weight:  Weight in (lb) to have BMI = 25: 145.3  GEN: WDWN, NAD, Non-toxic, A & O x 3, looks well HEENT: Atraumatic, Normocephalic. Neck supple. No masses, No LAD.  Bilateral TM wnl, oropharynx normal.  PEERL,EOMI.   Ears and Nose: No external deformity. CV: RRR, No M/G/R. No JVD. No thrill. No extra heart sounds. PULM: CTA B, no wheezes, crackles, rhonchi. No retractions. No resp. distress. No accessory  muscle use. ABD: S, NT, ND EXTR: No c/c/e NEURO Normal gait.  PSYCH: Normally interactive. Conversant. Not depressed or anxious appearing.  Calm demeanor.  Calves measure equally   Assessment and Plan: Hospital discharge follow-up  Here today to follow-up from hospital stay for illness that led to DKA.  Also, since I last saw her she was found to have a PE (thought provoked by travel) and started on xarelto.   She is overall doing well today and her DKA sx are resolved.  She is taking the xarelto without difficulty Discussed doing [place] of her legs to rule- out clot, but agreed that this would likely not be helpful as she is already on xarelto.   She is about 2 months into anticoagulation for her  PE at this time- will plan to continue for about 3 months.  Also, she had a repeat CT angiogram about 2 weeks after dx of PE which showed clot resolved  She will see me in a couple of weeks for a recheck    Signed Lamar Blinks, MD

## 2014-11-06 ENCOUNTER — Ambulatory Visit (HOSPITAL_COMMUNITY): Payer: Self-pay | Admitting: Psychiatry

## 2014-11-15 ENCOUNTER — Emergency Department (HOSPITAL_BASED_OUTPATIENT_CLINIC_OR_DEPARTMENT_OTHER)
Admission: EM | Admit: 2014-11-15 | Discharge: 2014-11-15 | Disposition: A | Discharge status: Home / Self Care | Payer: BLUE CROSS/BLUE SHIELD | Attending: Emergency Medicine | Admitting: Emergency Medicine

## 2014-11-15 ENCOUNTER — Encounter (HOSPITAL_BASED_OUTPATIENT_CLINIC_OR_DEPARTMENT_OTHER): Payer: Self-pay

## 2014-11-15 DIAGNOSIS — E1042 Type 1 diabetes mellitus with diabetic polyneuropathy: Secondary | ICD-10-CM | POA: Diagnosis not present

## 2014-11-15 DIAGNOSIS — H9202 Otalgia, left ear: Secondary | ICD-10-CM | POA: Diagnosis present

## 2014-11-15 DIAGNOSIS — G43909 Migraine, unspecified, not intractable, without status migrainosus: Secondary | ICD-10-CM | POA: Diagnosis not present

## 2014-11-15 DIAGNOSIS — Z87442 Personal history of urinary calculi: Secondary | ICD-10-CM | POA: Insufficient documentation

## 2014-11-15 DIAGNOSIS — M797 Fibromyalgia: Secondary | ICD-10-CM | POA: Insufficient documentation

## 2014-11-15 DIAGNOSIS — Z8719 Personal history of other diseases of the digestive system: Secondary | ICD-10-CM | POA: Insufficient documentation

## 2014-11-15 DIAGNOSIS — F329 Major depressive disorder, single episode, unspecified: Secondary | ICD-10-CM | POA: Insufficient documentation

## 2014-11-15 DIAGNOSIS — M1990 Unspecified osteoarthritis, unspecified site: Secondary | ICD-10-CM | POA: Diagnosis not present

## 2014-11-15 DIAGNOSIS — H6092 Unspecified otitis externa, left ear: Secondary | ICD-10-CM | POA: Insufficient documentation

## 2014-11-15 DIAGNOSIS — Z794 Long term (current) use of insulin: Secondary | ICD-10-CM | POA: Insufficient documentation

## 2014-11-15 DIAGNOSIS — Z79899 Other long term (current) drug therapy: Secondary | ICD-10-CM | POA: Diagnosis not present

## 2014-11-15 DIAGNOSIS — Z87828 Personal history of other (healed) physical injury and trauma: Secondary | ICD-10-CM | POA: Insufficient documentation

## 2014-11-15 DIAGNOSIS — F419 Anxiety disorder, unspecified: Secondary | ICD-10-CM | POA: Insufficient documentation

## 2014-11-15 DIAGNOSIS — Z86711 Personal history of pulmonary embolism: Secondary | ICD-10-CM | POA: Diagnosis not present

## 2014-11-15 DIAGNOSIS — Z7901 Long term (current) use of anticoagulants: Secondary | ICD-10-CM | POA: Diagnosis not present

## 2014-11-15 DIAGNOSIS — H66002 Acute suppurative otitis media without spontaneous rupture of ear drum, left ear: Secondary | ICD-10-CM | POA: Diagnosis not present

## 2014-11-15 MED ORDER — AMOXICILLIN 500 MG PO CAPS: 500.0000 mg | ORAL_CAPSULE | Freq: Three times a day (TID) | ORAL | Status: DC

## 2014-11-15 MED ORDER — CIPROFLOXACIN-DEXAMETHASONE 0.3-0.1 % OT SUSP
OTIC | Status: AC
  Administered 2014-11-15: 4 [drp] via OTIC
  Filled 2014-11-15: qty 7.5

## 2014-11-15 MED ORDER — CIPROFLOXACIN-DEXAMETHASONE 0.3-0.1 % OT SUSP
4.0000 [drp] | Freq: Two times a day (BID) | OTIC | Status: DC
  Administered 2014-11-15: 4 [drp] via OTIC

## 2014-11-15 NOTE — ED Provider Notes (Signed)
CSN: [id number]     Arrival date & time 11/15/14  1644 History   First MD Initiated Contact with Patient 11/15/14 1721     Chief Complaint  Patient presents with  . Otalgia     (Consider location/radiation/quality/duration/timing/severity/associated sxs/prior Treatment) HPI Comments: 44 year old female with past medical history including IDDM, PE on Xarelto, HLD who p/w L ear pain. Patient states that 2-3 days ago she began having left ear pain which has worsened since it began and is now severe and constant. Pain is worse with any movement of her ear. Pain radiates down the side of her neck. No fevers, cough, sore throat, nasal congestion, chest pain, or shortness of breath. She's been taking all diabetes medications as prescribed. No recent swimming.  Patient is a 44 y.o. female presenting with ear pain. The history is provided by the patient.  Otalgia   Past Medical History  Diagnosis Date  . 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 Fibromyalgia   . Hyperlipidemia   . Abdominal pain   . Nausea & vomiting   . Biliary dyskinesia   . Depression   . Joint pain   . Skin rash     due to medications  . Post traumatic stress disorder (PTSD)   . Sleep trouble   . Major depressive disorder ([place])   . Cervical strain   . Closed head injury 2010  . Renal stones   . Migraine     hx of none recent  . Arthritis   . Hypoglycemia   . Anxiety   . Pulmonary embolism [facility])    Past Surgical History  Procedure Laterality Date  . Lipoma removed  yrs ago  . Knee surgery Left 2012  . Cesarean section  1997, 1999  . Cholecystectomy  02/10/2011    Procedure: LAPAROSCOPIC CHOLECYSTECTOMY WITH INTRAOPERATIVE CHOLANGIOGRAM;  Surgeon: Judieth Keens, DO;  Location: [facility];  Service: General;  Laterality: N/A;  . Robotic assisted laparoscopic lysis of adhesion N/A 03/04/2012    Procedure: ROBOTIC ASSISTED LAPAROSCOPIC LYSIS OF EXTENSIVE ADHESIONS;  Surgeon:  Claiborne Billings A. Pamala Hurry, MD;  Location: [facility];  Service: Gynecology;  Laterality: N/A;  . Tonsillectomy  2001  . Abdominal hysterectomy  2001  . Eye surgery  2006    laser eye surgery left eye  . Exploratory laparomty  Mar 04, 2012  . Laparoscopic appendectomy N/A 10/12/2012    Procedure: DIAGNOSTIC APPENDECTOMY LAPAROSCOPIC;  Surgeon: Madilyn Hook, DO;  Location: [facility];  Service: General;  Laterality: N/A;  . Appendectomy     Family History  Problem Relation Age of Onset  . Cancer Father     lymphoma  . Nephrolithiasis Father   . Vascular Disease Father   . Alcohol abuse Father   . Drug abuse Father   . Cancer Maternal Grandmother     colon  . Hypertension Mother   . Heart disease Mother   . Cataracts Mother   . Depression Mother   . Diabetes Brother   . Drug abuse Brother   . Mental illness Maternal Grandfather   . Cancer Maternal Grandfather   . Heart disease Paternal Grandmother   . Heart disease Paternal Grandfather   . Suicidality Maternal Uncle    Social History  Substance Use Topics  . Smoking status: Never Smoker   . Smokeless tobacco: Never Used  . Alcohol Use: No   OB History    No data available     Review  of Systems  HENT: Positive for ear pain.     10 Systems reviewed and are negative for acute change except as noted in the HPI.   Allergies  Cephalexin; Ibuprofen; and Meloxicam  Home Medications   Prior to Admission medications   Medication Sig Start Date End Date Taking? Authorizing Provider  amoxicillin (AMOXIL) 500 MG capsule Take 1 capsule (500 mg total) by mouth 3 (three) times daily. 11/15/14   Sharlett Iles, MD  clonazePAM Bobbye [place]) 0.5 MG tablet Take one twice daily as needed for anxiety and itching Patient not taking: Reported on 11/01/2014 10/06/14   Posey Boyer, MD  FLUoxetine (PROZAC) 40 MG capsule Take 1 capsule (40 mg total) by mouth daily. For depression 05/29/14   Charlcie Cradle, MD  HYDROcodone-acetaminophen  (NORCO/VICODIN) 5-325 MG tablet Take 1 tablet by mouth every 6 (six) hours as needed for moderate pain. Patient not taking: Reported on 11/01/2014 10/25/14   Rhetta Mura Schorr, NP  insulin aspart (NOVOLOG) 100 UNIT/ML injection Inject 8 Units into the skin 3 (three) times daily with meals. 10/16/13   Kerrie Buffalo, NP  insulin glargine (LANTUS) 100 UNIT/ML injection Inject 26 Units into the skin at bedtime.     Historical Provider, MD  pantoprazole (PROTONIX) 40 MG tablet Take 1 tablet (40 mg total) by mouth daily. 09/05/14   Geradine Girt, DO  rivaroxaban (XARELTO) 20 MG TABS tablet Take 20 mg by mouth daily with supper.    Historical Provider, MD   BP 137/79 mmHg  Pulse 85  Temp(Src) 98.9 F (37.2 C) (Oral)  Resp 18  Ht 5\' 4"  (1.626 m)  Wt 160 lb (72.576 kg)  BMI 27.45 kg/m2  SpO2 96% Physical Exam  Constitutional: She is oriented to person, place, and time. She appears well-developed and well-nourished. No distress.  HENT:  Head: Normocephalic and atraumatic.  Right Ear: External ear normal.  Moist mucous membranes; L ear canal edematous with debris and tenderness to manipulation of tragus; bulging TM with pus behind TM  Eyes: Conjunctivae are normal. Pupils are equal, round, and reactive to light.  Neck: Neck supple. No tracheal deviation present.  Pulmonary/Chest: Effort normal.  Musculoskeletal: She exhibits no edema.  Neurological: She is alert and oriented to person, place, and time.  Fluent speech  Skin: Skin is warm and dry. No rash noted. No erythema.  Psychiatric: She has a normal mood and affect. Judgment normal.  Nursing note and vitals reviewed.   ED Course  Procedures (including critical care time) Time: 5:45pm Proceduralist: Theotis Burrow, MD Dx: L otitis externa with ear canal edema Procedure: placed ear wick in left ear canal and administered ciprodex drops onto wick Patient tolerated procedure well with no immediate complications.   Medications   ciprofloxacin-dexamethasone (CIPRODEX) 0.3-0.1 % otic suspension 4 drop (4 drops Left Ear Given 11/15/14 1740)    MDM   Final diagnoses:  Otitis externa, left  Acute suppurative otitis media of left ear without spontaneous rupture of tympanic membrane, recurrence not specified   44 year old female with IDDM who presents with 2-3 days of worsening left ear pain. Exam shows edematous ear canal consistent with otitis externa as well as pus behind TM consistent with acute otitis media. Patient has no significant facial swelling, erythema, or fevers to suggest a malignant otitis externa. Placed an ear wick and administered Ciprodex. Provided patient with Ciprodex and amoxicillin. I emphasized importance of close follow-up with PCP and prompt return to ED if any symptoms worsen as patient  is at risk for worsening infection. Patient voiced understanding and was discharged in satisfactory condition.    Sharlett Iles, MD 11/15/14 [phone]

## 2014-11-15 NOTE — ED Notes (Signed)
Left earache x 2-3 days

## 2014-11-15 NOTE — Discharge Instructions (Signed)
Otitis Externa Otitis externa is a bacterial or fungal infection of the outer ear canal. This is the area from the eardrum to the outside of the ear. Otitis externa is sometimes called "swimmer's ear." CAUSES  Possible causes of infection include:  Swimming in dirty water.  Moisture remaining in the ear after swimming or bathing.  Mild injury (trauma) to the ear.  Objects stuck in the ear (foreign body).  Cuts or scrapes (abrasions) on the outside of the ear. SIGNS AND SYMPTOMS  The first symptom of infection is often itching in the ear canal. Later signs and symptoms may include swelling and redness of the ear canal, ear pain, and yellowish-white fluid (pus) coming from the ear. The ear pain may be worse when pulling on the earlobe. DIAGNOSIS  Your health care provider will perform a physical exam. A sample of fluid may be taken from the ear and examined for bacteria or fungi. TREATMENT  Antibiotic ear drops are often given for 10 to 14 days. Treatment may also include pain medicine or corticosteroids to reduce itching and swelling. HOME CARE INSTRUCTIONS   Apply antibiotic ear drops to the ear canal as prescribed by your health care provider.  Take medicines only as directed by your health care provider.  If you have diabetes, follow any additional treatment instructions from your health care provider.  Keep all follow-up visits as directed by your health care provider. PREVENTION   Keep your ear dry. Use the corner of a towel to absorb water out of the ear canal after swimming or bathing.  Avoid scratching or putting objects inside your ear. This can damage the ear canal or remove the protective wax that lines the canal. This makes it easier for bacteria and fungi to grow.  Avoid swimming in lakes, polluted water, or poorly chlorinated pools.  You may use ear drops made of rubbing alcohol and vinegar after swimming. Combine equal parts of white vinegar and alcohol in a bottle.  Put 3 or 4 drops into each ear after swimming. SEEK MEDICAL CARE IF:   You have a fever.  Your ear is still red, swollen, painful, or draining pus after 3 days.  Your redness, swelling, or pain gets worse.  You have a severe headache.  You have redness, swelling, pain, or tenderness in the area behind your ear. MAKE SURE YOU:   Understand these instructions.  Will watch your condition.  Will get help right away if you are not doing well or get worse.   This information is not intended to replace advice given to you by your health care provider. Make sure you discuss any questions you have with your health care provider.   Document Released: 01/05/2005 Document Revised: 01/26/2014 Document Reviewed: 01/22/2011 Elsevier Interactive Patient Education 2016 [place].  Otitis Media With Effusion Otitis media with effusion is the presence of fluid in the middle ear. This is a common problem in children, which often follows ear infections. It may be present for weeks or longer after the infection. Unlike an acute ear infection, otitis media with effusion refers only to fluid behind the ear drum and not infection. Children with repeated ear and sinus infections and allergy problems are the most likely to get otitis media with effusion. CAUSES  The most frequent cause of the fluid buildup is dysfunction of the eustachian tubes. These are the tubes that drain fluid in the ears to the back of the nose (nasopharynx). SYMPTOMS   The main symptom of  this condition is hearing loss. As a result, you or your child may:  Listen to the TV at a loud volume.  Not respond to questions.  Ask "what" often when spoken to.  Mistake or confuse one sound or word for another.  There may be a sensation of fullness or pressure but usually not pain. DIAGNOSIS   Your health care provider will diagnose this condition by examining you or your child's ears.  Your health care provider may test the  pressure in you or your child's ear with a tympanometer.  A hearing test may be conducted if the problem persists. TREATMENT   Treatment depends on the duration and the effects of the effusion.  Antibiotics, decongestants, nose drops, and cortisone-type drugs (tablets or nasal spray) may not be helpful.  Children with persistent ear effusions may have delayed language or behavioral problems. Children at risk for developmental delays in hearing, learning, and speech may require referral to a specialist earlier than children not at risk.  You or your child's health care provider may suggest a referral to an ear, nose, and throat surgeon for treatment. The following may help restore normal hearing:  Drainage of fluid.  Placement of ear tubes (tympanostomy tubes).  Removal of adenoids (adenoidectomy). HOME CARE INSTRUCTIONS   Avoid secondhand smoke.  Infants who are breastfed are less likely to have this condition.  Avoid feeding infants while they are lying flat.  Avoid known environmental allergens.  Avoid people who are sick. SEEK MEDICAL CARE IF:   Hearing is not better in 3 months.  Hearing is worse.  Ear pain.  Drainage from the ear.  Dizziness. MAKE SURE YOU:   Understand these instructions.  Will watch your condition.  Will get help right away if you are not doing well or get worse.   This information is not intended to replace advice given to you by your health care provider. Make sure you discuss any questions you have with your health care provider.   Document Released: 02/13/2004 Document Revised: 01/26/2014 Document Reviewed: 08/02/2012 Elsevier Interactive Patient Education [employer].   Please return immediately to the ED if you develop fever, facial swelling, or any worsening symptoms.  It is very important for you to take all antibiotics as prescribed for the entire course.

## 2014-11-17 ENCOUNTER — Emergency Department (HOSPITAL_COMMUNITY)
Admission: EM | Admit: 2014-11-17 | Discharge: 2014-11-17 | Disposition: A | Discharge status: Home / Self Care | Payer: BLUE CROSS/BLUE SHIELD | Attending: Emergency Medicine | Admitting: Emergency Medicine

## 2014-11-17 ENCOUNTER — Emergency Department (HOSPITAL_COMMUNITY): Payer: BLUE CROSS/BLUE SHIELD

## 2014-11-17 ENCOUNTER — Encounter (HOSPITAL_COMMUNITY): Payer: Self-pay | Admitting: Family Medicine

## 2014-11-17 DIAGNOSIS — F419 Anxiety disorder, unspecified: Secondary | ICD-10-CM | POA: Diagnosis not present

## 2014-11-17 DIAGNOSIS — Z7901 Long term (current) use of anticoagulants: Secondary | ICD-10-CM | POA: Diagnosis not present

## 2014-11-17 DIAGNOSIS — H6092 Unspecified otitis externa, left ear: Secondary | ICD-10-CM | POA: Insufficient documentation

## 2014-11-17 DIAGNOSIS — Z792 Long term (current) use of antibiotics: Secondary | ICD-10-CM | POA: Insufficient documentation

## 2014-11-17 DIAGNOSIS — G43909 Migraine, unspecified, not intractable, without status migrainosus: Secondary | ICD-10-CM | POA: Diagnosis not present

## 2014-11-17 DIAGNOSIS — Z86711 Personal history of pulmonary embolism: Secondary | ICD-10-CM | POA: Insufficient documentation

## 2014-11-17 DIAGNOSIS — F329 Major depressive disorder, single episode, unspecified: Secondary | ICD-10-CM | POA: Insufficient documentation

## 2014-11-17 DIAGNOSIS — Z79899 Other long term (current) drug therapy: Secondary | ICD-10-CM | POA: Diagnosis not present

## 2014-11-17 DIAGNOSIS — H9202 Otalgia, left ear: Secondary | ICD-10-CM | POA: Diagnosis not present

## 2014-11-17 DIAGNOSIS — R6883 Chills (without fever): Secondary | ICD-10-CM | POA: Diagnosis not present

## 2014-11-17 DIAGNOSIS — H6692 Otitis media, unspecified, left ear: Secondary | ICD-10-CM | POA: Insufficient documentation

## 2014-11-17 DIAGNOSIS — M1990 Unspecified osteoarthritis, unspecified site: Secondary | ICD-10-CM | POA: Diagnosis not present

## 2014-11-17 DIAGNOSIS — E1042 Type 1 diabetes mellitus with diabetic polyneuropathy: Secondary | ICD-10-CM | POA: Insufficient documentation

## 2014-11-17 DIAGNOSIS — M542 Cervicalgia: Secondary | ICD-10-CM | POA: Diagnosis not present

## 2014-11-17 DIAGNOSIS — Z87828 Personal history of other (healed) physical injury and trauma: Secondary | ICD-10-CM | POA: Insufficient documentation

## 2014-11-17 DIAGNOSIS — Z87442 Personal history of urinary calculi: Secondary | ICD-10-CM | POA: Diagnosis not present

## 2014-11-17 DIAGNOSIS — R61 Generalized hyperhidrosis: Secondary | ICD-10-CM | POA: Diagnosis not present

## 2014-11-17 DIAGNOSIS — Z794 Long term (current) use of insulin: Secondary | ICD-10-CM | POA: Insufficient documentation

## 2014-11-17 LAB — CBG MONITORING, ED
Glucose-Capillary: 349 mg/dL — ABNORMAL HIGH (ref 65–99)
Glucose-Capillary: 359 mg/dL — ABNORMAL HIGH (ref 65–99)

## 2014-11-17 MED ORDER — HYDROCODONE-ACETAMINOPHEN 5-325 MG PO TABS
1.0000 | ORAL_TABLET | Freq: Once | ORAL | Status: AC
  Administered 2014-11-17: 1 via ORAL
  Filled 2014-11-17: qty 1

## 2014-11-17 MED ORDER — HYDROCODONE-ACETAMINOPHEN 5-325 MG PO TABS: 1.0000 | ORAL_TABLET | ORAL | Status: DC | PRN

## 2014-11-17 MED ORDER — HYDROCODONE-ACETAMINOPHEN 5-325 MG PO TABS
1.0000 | ORAL_TABLET | Freq: Once | ORAL | Status: AC
Start: 2014-11-17 — End: 2014-11-17
  Administered 2014-11-17: 1 via ORAL
  Filled 2014-11-17: qty 1

## 2014-11-17 MED ORDER — INSULIN ASPART 100 UNIT/ML ~~LOC~~ SOLN
4.0000 [IU] | Freq: Once | SUBCUTANEOUS | Status: AC
  Administered 2014-11-17: 4 [IU] via INTRAVENOUS
  Filled 2014-11-17: qty 1

## 2014-11-17 MED ORDER — SODIUM CHLORIDE 0.9 % IV BOLUS (SEPSIS)
1000.0000 mL | Freq: Once | INTRAVENOUS | Status: AC
  Administered 2014-11-17: 1000 mL via INTRAVENOUS

## 2014-11-17 MED ORDER — CIPROFLOXACIN HCL 500 MG PO TABS
500.0000 mg | ORAL_TABLET | Freq: Two times a day (BID) | ORAL | Status: DC
Start: 2014-11-17 — End: 2015-01-17

## 2014-11-17 MED ORDER — CIPROFLOXACIN IN D5W 400 MG/200ML IV SOLN
400.0000 mg | Freq: Once | INTRAVENOUS | Status: AC
  Administered 2014-11-17: 400 mg via INTRAVENOUS
  Filled 2014-11-17: qty 200

## 2014-11-17 NOTE — ED Notes (Signed)
Pt here for worsening pain in left ear. Recently dx with ear infection.

## 2014-11-17 NOTE — ED Notes (Signed)
Order placed for IV team for bolus for elevated Blood Sugar.

## 2014-11-17 NOTE — ED Notes (Signed)
Unable to obtain IV site due to poor access.

## 2014-11-17 NOTE — ED Provider Notes (Signed)
Patient sign out from Tamsen Meek, PA-C  The patient came in for reevaluation of left ear pain. She was seen a few days ago and started on some oral and otic antibiotics. She reports that she has been worsening. Dr. Ina Homes has seen patient as well as recommended IV Cipro and head CT. Incidentally it was noted that her glucose was 360, patient reported that she has not eaten yet today but can typically tell when her glucose is elevated and does not feel any of those typical symptoms.  Medications  sodium chloride 0.9 % bolus 1,000 mL (0 mLs Intravenous Stopped 11/17/14 1855)  HYDROcodone-acetaminophen (NORCO/VICODIN) 5-325 MG per tablet 1 tablet (1 tablet Oral Given 11/17/14 1532)  ciprofloxacin (CIPRO) IVPB 400 mg (0 mg Intravenous Stopped 11/17/14 1855)  insulin aspart (novoLOG) injection 4 Units (4 Units Intravenous Given 11/17/14 1916)  HYDROcodone-acetaminophen (NORCO/VICODIN) 5-325 MG per tablet 1 tablet (1 tablet Oral Given 11/17/14 1950)    After fluids and Cipro the patient's glucose actually elevated because the ciprofloxacin was mixed with 5% dextrose. Therefore the patient was given 4 units of insulin and on reevaluation her glucose is close to 250. The patient is overall doing better with stable vital signs. She be discharged with pain medication and oral Cipro with directions to follow up with  Referred ENT provider  44 y.o.[place] medical screening exam was performed and I feel the patient has had an appropriate workup for their chief complaint at this time and likelihood of emergent condition existing is low. They have been counseled on decision, discharge, follow up and which symptoms necessitate immediate return to the emergency department. They or their family verbally stated understanding and agreement with plan and discharged in stable condition.   Vital signs are stable at discharge. Filed Vitals:   11/17/14 1604  BP: 145/83  Pulse: 78  Temp:   Resp: [address], PA-C 11/17/14 2041  Carmin Muskrat, MD 11/18/14 [phone]

## 2014-11-17 NOTE — ED Provider Notes (Signed)
CSN: [id number]     Arrival date & time 11/17/14  1228 History   First MD Initiated Contact with Patient 11/17/14 1256     Chief Complaint  Patient presents with  . Otalgia   HPI  Maureen [name] is a 44 year old female with PMHx of IDDM presenting with left ear pain. Pt reports that she was seen two days ago for same complaint, diagnosed with AOM and AOE, given ear wick, ciprodex and amoxicillin. Pt was instructed to return to ED if symptoms worsen. She reports compliance with her medications and ear wick is still in place. She reports that her ear pain has worsened over the past two days. The pain is severe and constant. She reports that it radiates down the lateral side of her neck which is how it presented two days ago. She reports that her left ear feels "clogged up" and she cannot hear out of it as well. Pain is increased when her ear is touched. She endorses pain in her left jaw when she attempts to eat. She is also endorsing chills and diaphoresis. She says she has not taken her temperature at home but has been taking tylenol because she feels feverish. Denies swelling of her face/neck and redness around her ears, face or neck. Denies sore throat, cough, congestion, rhinorrhea, chest pain, SOB, abdominal pain, nausea, vomiting, headache, lightheadedness or syncope.   Past Medical History  Diagnosis Date  . 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 Fibromyalgia   . Hyperlipidemia   . Abdominal pain   . Nausea & vomiting   . Biliary dyskinesia   . Depression   . Joint pain   . Skin rash     due to medications  . Post traumatic stress disorder (PTSD)   . Sleep trouble   . Major depressive disorder ([place])   . Cervical strain   . Closed head injury 2010  . Renal stones   . Migraine     hx of none recent  . Arthritis   . Hypoglycemia   . Anxiety   . Pulmonary embolism [facility])    Past Surgical History  Procedure Laterality Date  . Lipoma removed   yrs ago  . Knee surgery Left 2012  . Cesarean section  1997, 1999  . Cholecystectomy  02/10/2011    Procedure: LAPAROSCOPIC CHOLECYSTECTOMY WITH INTRAOPERATIVE CHOLANGIOGRAM;  Surgeon: Judieth Keens, DO;  Location: [place];  Service: General;  Laterality: N/A;  . Robotic assisted laparoscopic lysis of adhesion N/A 03/04/2012    Procedure: ROBOTIC ASSISTED LAPAROSCOPIC LYSIS OF EXTENSIVE ADHESIONS;  Surgeon: Claiborne Billings A. Pamala Hurry, MD;  Location: [facility];  Service: Gynecology;  Laterality: N/A;  . Tonsillectomy  2001  . Abdominal hysterectomy  2001  . Eye surgery  2006    laser eye surgery left eye  . Exploratory laparomty  Mar 04, 2012  . Laparoscopic appendectomy N/A 10/12/2012    Procedure: DIAGNOSTIC APPENDECTOMY LAPAROSCOPIC;  Surgeon: Madilyn Hook, DO;  Location: [facility];  Service: General;  Laterality: N/A;  . Appendectomy     Family History  Problem Relation Age of Onset  . Cancer Father     lymphoma  . Nephrolithiasis Father   . Vascular Disease Father   . Alcohol abuse Father   . Drug abuse Father   . Cancer Maternal Grandmother     colon  . Hypertension Mother   . Heart disease Mother   . Cataracts Mother   .  Depression Mother   . Diabetes Brother   . Drug abuse Brother   . Mental illness Maternal Grandfather   . Cancer Maternal Grandfather   . Heart disease Paternal Grandmother   . Heart disease Paternal Grandfather   . Suicidality Maternal Uncle    Social History  Substance Use Topics  . Smoking status: Never Smoker   . Smokeless tobacco: Never Used  . Alcohol Use: No   OB History    No data available     Review of Systems  Constitutional: Positive for chills and diaphoresis. Negative for fever.  HENT: Positive for ear pain and hearing loss. Negative for congestion, ear discharge, facial swelling, rhinorrhea, sore throat and trouble swallowing.   Eyes: Negative for pain, redness and visual disturbance.  Respiratory: Negative for shortness of breath.    Cardiovascular: Negative for chest pain.  Gastrointestinal: Negative for nausea, vomiting, abdominal pain and diarrhea.  Musculoskeletal: Positive for neck pain. Negative for back pain and neck stiffness.  Skin: Negative for color change and rash.  Neurological: Negative for dizziness, syncope, weakness and headaches.  All other systems reviewed and are negative.     Allergies  Cephalexin; Ibuprofen; and Meloxicam  Home Medications   Prior to Admission medications   Medication Sig Start Date End Date Taking? Authorizing Provider  amoxicillin (AMOXIL) 500 MG capsule Take 1 capsule (500 mg total) by mouth 3 (three) times daily. 11/15/14   Sharlett Iles, MD  ciprofloxacin (CIPRO) 500 MG tablet Take 1 tablet (500 mg total) by mouth 2 (two) times daily. 11/17/14   Lahoma Crocker Rober Skeels, PA-C  clonazePAM (KLONOPIN) 0.5 MG tablet Take one twice daily as needed for anxiety and itching Patient not taking: Reported on 11/01/2014 10/06/14   Posey Boyer, MD  FLUoxetine (PROZAC) 40 MG capsule Take 1 capsule (40 mg total) by mouth daily. For depression 05/29/14   Charlcie Cradle, MD  HYDROcodone-acetaminophen (NORCO/VICODIN) 5-325 MG tablet Take 1 tablet by mouth every 6 (six) hours as needed for moderate pain. Patient not taking: Reported on 11/01/2014 10/25/14   Rhetta Mura Schorr, NP  insulin aspart (NOVOLOG) 100 UNIT/ML injection Inject 8 Units into the skin 3 (three) times daily with meals. 10/16/13   Kerrie Buffalo, NP  insulin glargine (LANTUS) 100 UNIT/ML injection Inject 26 Units into the skin at bedtime.     Historical Provider, MD  pantoprazole (PROTONIX) 40 MG tablet Take 1 tablet (40 mg total) by mouth daily. 09/05/14   Geradine Girt, DO  rivaroxaban (XARELTO) 20 MG TABS tablet Take 20 mg by mouth daily with supper.    Historical Provider, MD   BP 145/83 mmHg  Pulse 78  Temp(Src) 98.1 F (36.7 C) (Oral)  Resp 14  Ht 5\' 4"  (1.626 m)  Wt 162 lb 4 oz (73.596 kg)  BMI 27.84 kg/m2  SpO2  99% Physical Exam  Constitutional: She appears well-developed and well-nourished. No distress.  HENT:  Head: Normocephalic and atraumatic.  Right Ear: Tympanic membrane, external ear and ear canal normal.  Left Ear: There is tenderness. Tympanic membrane is not perforated and not erythematous.  Mouth/Throat: Oropharynx is clear and moist. No oropharyngeal exudate.  Elza Rafter removed with purulent discharge on it. Pain to palpation of external ear. Canal is erythematous without edema or debris. Left TM cloudy without visualization of bony structures. No erythema noted to TM. No bulging or retracted TM. No perforation. No swelling noted to face. No erythema noted to ear or surrounding area.   Eyes: Conjunctivae  and EOM are normal. Pupils are equal, round, and reactive to light. Right eye exhibits no discharge. Left eye exhibits no discharge. No scleral icterus.  Neck: Normal range of motion. Neck supple.  Neck is supple without adenopathy. Pt able to rotate neck and chin to chest without pain. No swelling noted over neck or submandibular area. No erythema.   Cardiovascular: Normal rate and regular rhythm.   Pulmonary/Chest: Effort normal. No respiratory distress.  Musculoskeletal: Normal range of motion.  Lymphadenopathy:    She has no cervical adenopathy.  Neurological: She is alert. No cranial nerve deficit. Coordination normal.  Cranial nerves 3-12 tested and intact.   Skin: Skin is warm and dry.  Psychiatric: She has a normal mood and affect. Her behavior is normal.  Nursing note and vitals reviewed.   ED Course  Procedures (including critical care time) Labs Review Labs Reviewed  CBG MONITORING, ED    Imaging Review Ct Head Wo Contrast  11/17/2014  CLINICAL DATA:  Left ear pain radiating to the neck. EXAM: CT HEAD WITHOUT CONTRAST TECHNIQUE: Contiguous axial images were obtained from the base of the skull through the vertex without intravenous contrast. COMPARISON:  MRI of the brain  09/02/2014 FINDINGS: Brain: No evidence of acute infarction, hemorrhage, extra-axial collection, ventriculomegaly, or mass effect. Vascular: No hyperdense vessel or unexpected calcification. Skull: Negative for fracture or focal lesion. Sinuses/Orbits: No acute findings. Mastoid air cells are well aerated. Other: There is no fluid within the left ear canal. There is no significant soft tissue thickening surrounding the left external ear. IMPRESSION: No acute intracranial abnormality. Normal CT appearance of the left ear. Electronically Signed   By: Fidela Salisbury M.D.   On: 11/17/2014 16:09   I have personally reviewed and evaluated these images and lab results as part of my medical decision-making.   EKG Interpretation None      MDM   Final diagnoses:  Otalgia of left ear  Acute left otitis media, recurrence not specified, unspecified otitis media type  Otitis externa, left   Pt presenting with worsening ear pain after diagnosis of AOM and AOE 2 days ago. Pt reports compliance with ciprodex and amoxicillin. Reports that the pain is in the same areas but has worsened in intensity over the past day. Ear wick still in place. Pt reports subjective chills and sweats but no documented fevers. No new associated symptoms. External canal is erythematous without edema or debris. Appears to be improved when compared to notes from last visit. TM is cloudy and bones are not well visualized. No bulging, retraction or erythema. Discussed case with Dr. Alfonse Spruce who saw and assessed pt. CT head rules out malignant otitis externa. Will discontinue her amoxicillin and give ciprofloxacin instead. Pt to continue ciprodex. Encouraged pt to call [facility] on Monday to schedule a follow up appointment. Pt noted to be hyperglycemic to 350s. Fluid bolus given to bring blood sugar down.  Return precautions given in discharge paperwork and discussed with pt at bedside. Stressed importance of ENT follow up and return to  ED with new/worsening symptoms. Pt agrees with above plan and is stable for discharge    Josephina Gip, PA-C 11/17/14 [place], MD 11/17/14 2249

## 2014-11-17 NOTE — Discharge Instructions (Signed)
- Call [facility] ENT to schedule a follow up appointment - Stop taking the amoxicillin and take the ciprofloxacin tablets instead - Continue the ciprodex ear drops   Otitis Externa Otitis externa is a bacterial or fungal infection of the outer ear canal. This is the area from the eardrum to the outside of the ear. Otitis externa is sometimes called "swimmer's ear." CAUSES  Possible causes of infection include:  Swimming in dirty water.  Moisture remaining in the ear after swimming or bathing.  Mild injury (trauma) to the ear.  Objects stuck in the ear (foreign body).  Cuts or scrapes (abrasions) on the outside of the ear. SIGNS AND SYMPTOMS  The first symptom of infection is often itching in the ear canal. Later signs and symptoms may include swelling and redness of the ear canal, ear pain, and yellowish-white fluid (pus) coming from the ear. The ear pain may be worse when pulling on the earlobe. DIAGNOSIS  Your health care provider will perform a physical exam. A sample of fluid may be taken from the ear and examined for bacteria or fungi. TREATMENT  Antibiotic ear drops are often given for 10 to 14 days. Treatment may also include pain medicine or corticosteroids to reduce itching and swelling. HOME CARE INSTRUCTIONS   Apply antibiotic ear drops to the ear canal as prescribed by your health care provider.  Take medicines only as directed by your health care provider.  If you have diabetes, follow any additional treatment instructions from your health care provider.  Keep all follow-up visits as directed by your health care provider. PREVENTION   Keep your ear dry. Use the corner of a towel to absorb water out of the ear canal after swimming or bathing.  Avoid scratching or putting objects inside your ear. This can damage the ear canal or remove the protective wax that lines the canal. This makes it easier for bacteria and fungi to grow.  Avoid swimming in lakes, polluted  water, or poorly chlorinated pools.  You may use ear drops made of rubbing alcohol and vinegar after swimming. Combine equal parts of white vinegar and alcohol in a bottle. Put 3 or 4 drops into each ear after swimming. SEEK MEDICAL CARE IF:   You have a fever.  Your ear is still red, swollen, painful, or draining pus after 3 days.  Your redness, swelling, or pain gets worse.  You have a severe headache.  You have redness, swelling, pain, or tenderness in the area behind your ear. MAKE SURE YOU:   Understand these instructions.  Will watch your condition.  Will get help right away if you are not doing well or get worse.   This information is not intended to replace advice given to you by your health care provider. Make sure you discuss any questions you have with your health care provider.   Document Released: 01/05/2005 Document Revised: 01/26/2014 Document Reviewed: 01/22/2011 Elsevier Interactive Patient Education 2016 [employer].  Otitis Media, Adult Otitis media is redness, soreness, and inflammation of the middle ear. Otitis media may be caused by allergies or, most commonly, by infection. Often it occurs as a complication of the common cold. SIGNS AND SYMPTOMS Symptoms of otitis media may include:  Earache.  Fever.  Ringing in your ear.  Headache.  Leakage of fluid from the ear. DIAGNOSIS To diagnose otitis media, your health care provider will examine your ear with an otoscope. This is an instrument that allows your health care provider to see  into your ear in order to examine your eardrum. Your health care provider also will ask you questions about your symptoms. TREATMENT  Typically, otitis media resolves on its own within 3-5 days. Your health care provider may prescribe medicine to ease your symptoms of pain. If otitis media does not resolve within 5 days or is recurrent, your health care provider may prescribe antibiotic medicines if he or she suspects that  a bacterial infection is the cause. HOME CARE INSTRUCTIONS   If you were prescribed an antibiotic medicine, finish it all even if you start to feel better.  Take medicines only as directed by your health care provider.  Keep all follow-up visits as directed by your health care provider. SEEK MEDICAL CARE IF:  You have otitis media only in one ear, or bleeding from your nose, or both.  You notice a lump on your neck.  You are not getting better in 3-5 days.  You feel worse instead of better. SEEK IMMEDIATE MEDICAL CARE IF:   You have pain that is not controlled with medicine.  You have swelling, redness, or pain around your ear or stiffness in your neck.  You notice that part of your face is paralyzed.  You notice that the bone behind your ear (mastoid) is tender when you touch it. MAKE SURE YOU:   Understand these instructions.  Will watch your condition.  Will get help right away if you are not doing well or get worse.   This information is not intended to replace advice given to you by your health care provider. Make sure you discuss any questions you have with your health care provider.   Document Released: 10/11/2003 Document Revised: 01/26/2014 Document Reviewed: 08/02/2012 Elsevier Interactive Patient Education [employer].

## 2014-11-18 LAB — CBG MONITORING, ED: Glucose-Capillary: 249 mg/dL — ABNORMAL HIGH (ref 65–99)

## 2014-11-20 ENCOUNTER — Emergency Department (HOSPITAL_COMMUNITY)
Admission: EM | Admit: 2014-11-20 | Discharge: 2014-11-20 | Disposition: A | Discharge status: Home / Self Care | Payer: BLUE CROSS/BLUE SHIELD | Attending: Emergency Medicine | Admitting: Emergency Medicine

## 2014-11-20 ENCOUNTER — Encounter (HOSPITAL_COMMUNITY): Payer: Self-pay

## 2014-11-20 DIAGNOSIS — Z86711 Personal history of pulmonary embolism: Secondary | ICD-10-CM | POA: Insufficient documentation

## 2014-11-20 DIAGNOSIS — Z794 Long term (current) use of insulin: Secondary | ICD-10-CM | POA: Diagnosis not present

## 2014-11-20 DIAGNOSIS — Z87442 Personal history of urinary calculi: Secondary | ICD-10-CM | POA: Diagnosis not present

## 2014-11-20 DIAGNOSIS — Z8669 Personal history of other diseases of the nervous system and sense organs: Secondary | ICD-10-CM | POA: Insufficient documentation

## 2014-11-20 DIAGNOSIS — Z8659 Personal history of other mental and behavioral disorders: Secondary | ICD-10-CM | POA: Diagnosis not present

## 2014-11-20 DIAGNOSIS — E109 Type 1 diabetes mellitus without complications: Secondary | ICD-10-CM | POA: Diagnosis not present

## 2014-11-20 DIAGNOSIS — Z87828 Personal history of other (healed) physical injury and trauma: Secondary | ICD-10-CM | POA: Insufficient documentation

## 2014-11-20 DIAGNOSIS — Z792 Long term (current) use of antibiotics: Secondary | ICD-10-CM | POA: Diagnosis not present

## 2014-11-20 DIAGNOSIS — Z8739 Personal history of other diseases of the musculoskeletal system and connective tissue: Secondary | ICD-10-CM | POA: Diagnosis not present

## 2014-11-20 DIAGNOSIS — H9202 Otalgia, left ear: Secondary | ICD-10-CM | POA: Diagnosis not present

## 2014-11-20 DIAGNOSIS — Z7901 Long term (current) use of anticoagulants: Secondary | ICD-10-CM | POA: Insufficient documentation

## 2014-11-20 DIAGNOSIS — Z8719 Personal history of other diseases of the digestive system: Secondary | ICD-10-CM | POA: Diagnosis not present

## 2014-11-20 NOTE — ED Notes (Signed)
Pt states that she feels like she is under water (in reference to her ear)

## 2014-11-20 NOTE — ED Notes (Signed)
PA at bedside.

## 2014-11-20 NOTE — Discharge Instructions (Signed)
Please continue your antibiotics.  Please follow-up with the ENT doctor listed in the next 2 days for recheck.  Earache An earache, also called otalgia, can be caused by many things. Pain from an earache can be sharp, dull, or burning. The pain may be temporary or constant. Earaches can be caused by problems with the ear, such as infection in either the middle ear or the ear canal, injury, impacted ear wax, middle ear pressure, or a foreign body in the ear. Ear pain can also result from problems in other areas. This is called referred pain. For example, pain can come from a sore throat, a tooth infection, or problems with the jaw or the joint between the jaw and the skull (temporomandibular joint, or TMJ). The cause of an earache is not always easy to identify. Watchful waiting may be appropriate for some earaches until a clear cause of the pain can be found. HOME CARE INSTRUCTIONS Watch your condition for any changes. The following actions may help to lessen any discomfort that you are feeling:  Take medicines only as directed by your health care provider. This includes ear drops.  Apply ice to your outer ear to help reduce pain.  Put ice in a plastic bag.  Place a towel between your skin and the bag.  Leave the ice on for 20 minutes, 2-3 times per day.  Do not put anything in your ear other than medicine that is prescribed by your health care provider.  Try resting in an upright position instead of lying down. This may help to reduce pressure in the middle ear and relieve pain.  Chew gum if it helps to relieve your ear pain.  Control any allergies that you have.  Keep all follow-up visits as directed by your health care provider. This is important. SEEK MEDICAL CARE IF:  Your pain does not improve within 2 days.  You have a fever.  You have new or worsening symptoms. SEEK IMMEDIATE MEDICAL CARE IF:  You have a severe headache.  You have a stiff neck.  You have difficulty  swallowing.  You have redness or swelling behind your ear.  You have drainage from your ear.  You have hearing loss.  You feel dizzy.   This information is not intended to replace advice given to you by your health care provider. Make sure you discuss any questions you have with your health care provider.   Document Released: 08/23/2003 Document Revised: 01/26/2014 Document Reviewed: 08/06/2013 Elsevier Interactive Patient Education [employer].

## 2014-11-20 NOTE — ED Notes (Signed)
Per pt she started having bleeding from her left ear last night. Pt states that she "went through a quarter of a bag of cotton balls blotting the blood". No bleeding noted from either ear at this time.

## 2014-11-20 NOTE — ED Provider Notes (Signed)
CSN: [id number]     Arrival date & time 11/20/14  8299 History   First MD Initiated Contact with Patient 11/20/14 0601     Chief Complaint  Patient presents with  . Otalgia     (Consider location/radiation/quality/duration/timing/severity/associated sxs/prior Treatment) HPI Comments: Patient presents to the ED with a chief complaint of left sided otalgia.  Patient states that she has had an earache for the past 4 days.  She was seen at [facility] on 10/27 and started on amoxicillin and ciprodex for OM and OE.  She was then seen 2 days later at [facility].  She had a head CT and was given a round of IV cipro.  She was also switched from amoxicillin to cipro oral.  Patient states that she still has pain and fullness in her ear.  There are no aggravating or alleviating factors.  She has not followed up with ENT.  She reports subjective fevers and chills.  She denies any additional symptoms.  The history is provided by the patient. No language interpreter was used.    Past Medical History  Diagnosis Date  . 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 Fibromyalgia   . Hyperlipidemia   . Abdominal pain   . Nausea & vomiting   . Biliary dyskinesia   . Depression   . Joint pain   . Skin rash     due to medications  . Post traumatic stress disorder (PTSD)   . Sleep trouble   . Major depressive disorder ([place])   . Cervical strain   . Closed head injury 2010  . Renal stones   . Migraine     hx of none recent  . Arthritis   . Hypoglycemia   . Anxiety   . Pulmonary embolism [facility])    Past Surgical History  Procedure Laterality Date  . Lipoma removed  yrs ago  . Knee surgery Left 2012  . Cesarean section  1997, 1999  . Cholecystectomy  02/10/2011    Procedure: LAPAROSCOPIC CHOLECYSTECTOMY WITH INTRAOPERATIVE CHOLANGIOGRAM;  Surgeon: Judieth Keens, DO;  Location: [place];  Service: General;  Laterality: N/A;  . Robotic assisted laparoscopic lysis of adhesion N/A  03/04/2012    Procedure: ROBOTIC ASSISTED LAPAROSCOPIC LYSIS OF EXTENSIVE ADHESIONS;  Surgeon: Claiborne Billings A. Pamala Hurry, MD;  Location: [facility];  Service: Gynecology;  Laterality: N/A;  . Tonsillectomy  2001  . Abdominal hysterectomy  2001  . Eye surgery  2006    laser eye surgery left eye  . Exploratory laparomty  Mar 04, 2012  . Laparoscopic appendectomy N/A 10/12/2012    Procedure: DIAGNOSTIC APPENDECTOMY LAPAROSCOPIC;  Surgeon: Madilyn Hook, DO;  Location: [facility];  Service: General;  Laterality: N/A;  . Appendectomy     Family History  Problem Relation Age of Onset  . Cancer Father     lymphoma  . Nephrolithiasis Father   . Vascular Disease Father   . Alcohol abuse Father   . Drug abuse Father   . Cancer Maternal Grandmother     colon  . Hypertension Mother   . Heart disease Mother   . Cataracts Mother   . Depression Mother   . Diabetes Brother   . Drug abuse Brother   . Mental illness Maternal Grandfather   . Cancer Maternal Grandfather   . Heart disease Paternal Grandmother   . Heart disease Paternal Grandfather   . Suicidality Maternal Uncle    Social History  Substance Use Topics  .  Smoking status: Never Smoker   . Smokeless tobacco: Never Used  . Alcohol Use: No   OB History    No data available     Review of Systems  Constitutional: Negative for fever and chills.  HENT: Positive for ear pain.   Respiratory: Negative for shortness of breath.   Cardiovascular: Negative for chest pain.  Gastrointestinal: Negative for nausea, vomiting, diarrhea and constipation.  Genitourinary: Negative for dysuria.      Allergies  Cephalexin; Ibuprofen; and Meloxicam  Home Medications   Prior to Admission medications   Medication Sig Start Date End Date Taking? Authorizing Provider  ciprofloxacin (CIPRO) 500 MG tablet Take 1 tablet (500 mg total) by mouth 2 (two) times daily. 11/17/14  Yes Stevi Barrett, PA-C  insulin aspart (NOVOLOG) 100 UNIT/ML injection Inject 8 Units  into the skin 3 (three) times daily with meals. 10/16/13  Yes Kerrie Buffalo, NP  insulin glargine (LANTUS) 100 UNIT/ML injection Inject 26 Units into the skin at bedtime.    Yes Historical Provider, MD  rivaroxaban (XARELTO) 20 MG TABS tablet Take 20 mg by mouth daily with supper.   Yes Historical Provider, MD   BP 143/84 mmHg  Pulse 86  Temp(Src) 98 F (36.7 C) (Oral)  Resp 17  Ht 5\' 4"  (1.626 m)  Wt 162 lb (73.483 kg)  BMI 27.79 kg/m2  SpO2 100% Physical Exam  Constitutional: She is oriented to person, place, and time. She appears well-developed and well-nourished.  HENT:  Head: Normocephalic and atraumatic.  Left TM is cloudy with mild congestion, ear canal remarkable for small amount of debris and dried blood, no active bleeding, no sign of TM perforation, no significant swelling  No mastoid tenderness or swelling bilaterally.  Right TM is unremarkable  Eyes: Conjunctivae and EOM are normal.  Neck: Normal range of motion.  Cardiovascular: Normal rate, regular rhythm and normal heart sounds.  Exam reveals no gallop and no friction rub.   No murmur heard. Pulmonary/Chest: Effort normal and breath sounds normal. No respiratory distress. She has no wheezes. She has no rales.  Abdominal: She exhibits no distension.  Musculoskeletal: Normal range of motion.  Neurological: She is alert and oriented to person, place, and time.  Skin: Skin is dry.  Psychiatric: She has a normal mood and affect. Her behavior is normal. Judgment and thought content normal.  Nursing note and vitals reviewed.   ED Course  Procedures (including critical care time)   MDM   Final diagnoses:  Otalgia of left ear    Patient with persistent otalgia of left ear.  Being treated for OM/OE with cipro oral and drops.  VSS.  No mastoid tenderness, no cellulitis.  Recent CT reassuring.  Doubt malignant OM.  Recommend staying the course and continuing treatment as previously prescribed.  Recommend ENT follow-up  in 2 days.  OTC analgesics for pain.  Patient understands and agrees with the plan.  Patient discussed with Dr. Leonides Schanz, who agrees with the plan.    Montine Circle, PA-C 11/20/14 [place], PA-C 11/20/14 [place], DO 11/20/14 [id number]

## 2014-11-21 ENCOUNTER — Ambulatory Visit: Payer: BLUE CROSS/BLUE SHIELD | Admitting: Family Medicine

## 2014-11-28 ENCOUNTER — Other Ambulatory Visit: Payer: Self-pay | Admitting: Family Medicine

## 2014-11-28 DIAGNOSIS — E785 Hyperlipidemia, unspecified: Secondary | ICD-10-CM

## 2014-11-29 ENCOUNTER — Ambulatory Visit (HOSPITAL_COMMUNITY): Payer: Self-pay | Admitting: Psychiatry

## 2014-12-04 NOTE — Telephone Encounter (Signed)
Does she need a refill on Xarelto?

## 2014-12-04 NOTE — Telephone Encounter (Deleted)
How much

## 2014-12-05 MED ORDER — ATORVASTATIN CALCIUM 40 MG PO TABS: 40.0000 mg | ORAL_TABLET | Freq: Every day | ORAL | Status: DC

## 2014-12-05 NOTE — Telephone Encounter (Signed)
We have not Rxd this for pt before. She was started on it in hosp, it appears. Dr Lorelei Pont, you were most recent Dr that saw pt for this, do you want to Rx?

## 2014-12-05 NOTE — Telephone Encounter (Signed)
Called her- I don't think she needs to continue the xarelto as she has completed 3 months of treatment for a provoked first PE She feels generally well, she is breathing fine except she did have a cold recently  Never had a clot in the past, she is not a smoker Advised to avoid hormones, smoking, and long periods of immobility in the future

## 2014-12-19 ENCOUNTER — Other Ambulatory Visit: Payer: Self-pay | Admitting: Family Medicine

## 2015-01-17 ENCOUNTER — Ambulatory Visit (INDEPENDENT_AMBULATORY_CARE_PROVIDER_SITE_OTHER): Payer: BLUE CROSS/BLUE SHIELD

## 2015-01-17 ENCOUNTER — Ambulatory Visit (INDEPENDENT_AMBULATORY_CARE_PROVIDER_SITE_OTHER): Payer: BLUE CROSS/BLUE SHIELD | Admitting: Family Medicine

## 2015-01-17 VITALS — BP 142/78 | HR 90 | Temp 99.2°F | Resp 16 | Ht 64.0 in | Wt 171.0 lb

## 2015-01-17 DIAGNOSIS — R1011 Right upper quadrant pain: Secondary | ICD-10-CM | POA: Diagnosis not present

## 2015-01-17 DIAGNOSIS — L299 Pruritus, unspecified: Secondary | ICD-10-CM | POA: Diagnosis not present

## 2015-01-17 DIAGNOSIS — E108 Type 1 diabetes mellitus with unspecified complications: Secondary | ICD-10-CM

## 2015-01-17 DIAGNOSIS — R109 Unspecified abdominal pain: Secondary | ICD-10-CM

## 2015-01-17 LAB — POCT CBC
Granulocyte percent: 74.7 %G (ref 37–80)
HCT, POC: 45 % (ref 37.7–47.9)
Hemoglobin: 15.2 g/dL (ref 12.2–16.2)
Lymph, poc: 2 (ref 0.6–3.4)
MCH, POC: 31.9 pg — AB (ref 27–31.2)
MCHC: 33.7 g/dL (ref 31.8–35.4)
MCV: 94.7 fL (ref 80–97)
MID (cbc): 0.8 (ref 0–0.9)
MPV: 7.5 fL (ref 0–99.8)
POC Granulocyte: 8.4 — AB (ref 2–6.9)
POC LYMPH PERCENT: 17.9 %L (ref 10–50)
POC MID %: 7.4 %M (ref 0–12)
Platelet Count, POC: 466 10*3/uL — AB (ref 142–424)
RBC: 4.75 M/uL (ref 4.04–5.48)
RDW, POC: 14.2 %
WBC: 11.3 10*3/uL — AB (ref 4.6–10.2)

## 2015-01-17 LAB — HEMOGLOBIN A1C
Hgb A1c MFr Bld: 9.8 % — ABNORMAL HIGH (ref <5.7)
Mean Plasma Glucose: 235 mg/dL — ABNORMAL HIGH (ref <117)

## 2015-01-17 LAB — POC MICROSCOPIC URINALYSIS (UMFC): Mucus: ABSENT

## 2015-01-17 LAB — GLUCOSE, POCT (MANUAL RESULT ENTRY): POC Glucose: 147 mg/dl — AB (ref 70–99)

## 2015-01-17 LAB — POCT URINALYSIS DIP (MANUAL ENTRY)
Bilirubin, UA: NEGATIVE
Blood, UA: NEGATIVE
Glucose, UA: NEGATIVE
Leukocytes, UA: NEGATIVE
Nitrite, UA: NEGATIVE
Protein Ur, POC: 30 — AB
Spec Grav, UA: 1.02
Urobilinogen, UA: 1
pH, UA: 5.5

## 2015-01-17 MED ORDER — CYPROHEPTADINE HCL 4 MG PO TABS: ORAL_TABLET | ORAL | Status: DC

## 2015-01-17 NOTE — Progress Notes (Addendum)
Patient ID: Maureen [name], female    DOB: 1970-02-25  Age: 44 y.o. MRN: [id number]  Chief Complaint  Patient presents with  . Abdominal Pain    Onset 2 days  . Itching    Arms, lower back and buttocks onset 2-3 days    Subjective:   44 year old lady who is here because she's had abdominal pain for the last 2 days. It is been hurting her fairly constantly in the right upper quadrant around to the right flank. It is somewhat along the distribution of where she had old shingles a couple of years ago. She has not had any vomiting though she has had some nausea. Bowels active okay as have the as the urine. She does not have any change in color of her urine or stool. She has not been systemically ill with fevers or other issues.  Her other main problem is that she has over the last few weeks been itching badly especially last few days. During the night and inadvertently she scratches herself. She has excoriations on her buttocks and thighs and legs. She has really not had this like this before. She has trimmed her nails try to keep from getting herself  She is a type I diabetic, with diabetes for over 30 years. She is the mother of 2 late teens early 45s young people. She has been under tremendous amount of stress going through divorce over the last 2 years, which has had some new issues and it recently.  Current allergies, medications, problem list, past/family and social histories reviewed.  Objective:  BP 142/78 mmHg  Pulse 90  Temp(Src) 99.2 F (37.3 C) (Oral)  Resp 16  Ht 5\' 4"  (1.626 m)  Wt 171 lb (77.565 kg)  BMI 29.34 kg/m2  SpO2 98%  Pleasant lady in no acute distress. No conjunctival icterus. Neck supple without nodes. Chest clear. Heart regular without murmur. Abdomen has minimal but present bowel sounds. Normal to percussion. On palpation she has some generalized tenderness across the lower abdomen and in the lower quadrant without hepatosplenomegaly or other masses. Is a  little tender in the right and left CVA area. She has multiple old and new excoriations on lower abdomen, buttocks, thighs, and legs. These appear to be left to a couple weeks old at least.  Assessment & Plan:   Assessment: 1. Abdominal pain, RUQ   2. Right flank pain   3. Type 1 diabetes mellitus with complication (HCC)   4. Itching       Plan: Need to evaluate the abdomen with x-ray, lab, and urine. It does not seem severely acute right now. Although her bowels are moving and wondered if she has stool backed up causing this.  Check liver enzymes because of the itching. Recheck on the status of her diabetes. Her sugars often run over 200 and she gets SSI transiently.  Results for orders placed or performed in visit on 01/17/15  POCT CBC  Result Value Ref Range   WBC 11.3 (A) 4.6 - 10.2 K/uL   Lymph, poc 2.0 0.6 - 3.4   POC LYMPH PERCENT 17.9 10 - 50 %L   MID (cbc) 0.8 0 - 0.9   POC MID % 7.4 0 - 12 %M   POC Granulocyte 8.4 (A) 2 - 6.9   Granulocyte percent 74.7 37 - 80 %G   RBC 4.75 4.04 - 5.48 M/uL   Hemoglobin 15.2 12.2 - 16.2 g/dL   HCT, POC 45.0 37.7 - 47.9 %  MCV 94.7 80 - 97 fL   MCH, POC 31.9 (A) 27 - 31.2 pg   MCHC 33.7 31.8 - 35.4 g/dL   RDW, POC 14.2 %   Platelet Count, POC 466 (A) 142 - 424 K/uL   MPV 7.5 0 - 99.8 fL  POCT urinalysis dipstick  Result Value Ref Range   Color, UA yellow yellow   Clarity, UA clear clear   Glucose, UA negative negative   Bilirubin, UA negative negative   Ketones, POC UA trace (5) (A) negative   Spec Grav, UA 1.020    Blood, UA negative negative   pH, UA 5.5    Protein Ur, POC =30 (A) negative   Urobilinogen, UA 1.0    Nitrite, UA Negative Negative   Leukocytes, UA Negative Negative  POCT Microscopic Urinalysis ([facility])  Result Value Ref Range   WBC,UR,HPF,POC None None WBC/hpf   RBC,UR,HPF,POC None None RBC/hpf   Bacteria None None, Too numerous to count   Mucus Absent Absent   Epithelial Cells, UR Per Microscopy None  None, Too numerous to count cells/hpf  POCT glucose (manual entry)  Result Value Ref Range   POC Glucose 147 (A) 70 - 99 mg/dl     Orders Placed This Encounter  Procedures  . DG Abd 2 Views    Order Specific Question:  Reason for Exam (SYMPTOM  OR DIAGNOSIS REQUIRED)    Answer:  right upper abdominal and flank pain for 2 days    Order Specific Question:  Is the patient pregnant?    Answer:  No    Order Specific Question:  Preferred imaging location?    Answer:  External  . Hemoglobin A1c  . COMPLETE METABOLIC PANEL WITH GFR  . Lipase  . POCT CBC  . POCT urinalysis dipstick  . POCT Microscopic Urinalysis ([facility])  . POCT glucose (manual entry)    Will try using the Periactin for itching. Return if not improving.  I think that when we get the bowels moving better, that will help both abdominal pain and the itching. However she has a mildly elevated white blood count so cautioned her to watch for fevers or come back in any time if needed.      Patient Instructions  Take MiraLAX once or twice daily for a few days until your bowels start moving looser, and then decrease gradually just to keep the bowels moving fairly loosely on a regular basis.  Drink lots of fluids. Especially grape juice and apple juice and prune juice are good for the bowels, but do have high sugar content so maybe a cane take a lot.  Eat plenty of fruits and vegetables  Try to get regular exercise, especially such as taking a walk. In the event of more severe belly pain, fever, vomiting, or passing of any blood or other items of concern please either return or go to the emergency room.  I will let you know the results of your other labs in the next couple of days.  For the itching, take Cyprohexedine 4 mg 1/2-1 tablet 3 times daily as needed for itching. Will cause drowsiness, but is very good usually for nighttime itching.  Anxiety is probably a big part of your itching   Continue taking your  Prozac  Return at anytime as needed.   [facility] reading (PRIMARY) by  Dr. Linna Darner Normal except for heavy stool burden.     Return if symptoms worsen or fail to improve.   HOPPER,DAVID, MD 01/17/2015

## 2015-01-17 NOTE — Patient Instructions (Addendum)
Take MiraLAX once or twice daily for a few days until your bowels start moving looser, and then decrease gradually just to keep the bowels moving fairly loosely on a regular basis.  Drink lots of fluids. Especially grape juice and apple juice and prune juice are good for the bowels, but do have high sugar content so maybe a cane take a lot.  Eat plenty of fruits and vegetables  Try to get regular exercise, especially such as taking a walk. In the event of more severe belly pain, fever, vomiting, or passing of any blood or other items of concern please either return or go to the emergency room.  I will let you know the results of your other labs in the next couple of days.  For the itching, take Cyprohexedine 4 mg 1/2-1 tablet 3 times daily as needed for itching. Will cause drowsiness, but is very good usually for nighttime itching.  Anxiety is probably a big part of your itching   Continue taking your Prozac  Return at anytime as needed.

## 2015-01-18 ENCOUNTER — Emergency Department (HOSPITAL_COMMUNITY): Payer: BLUE CROSS/BLUE SHIELD

## 2015-01-18 ENCOUNTER — Encounter (HOSPITAL_COMMUNITY): Payer: Self-pay | Admitting: Emergency Medicine

## 2015-01-18 DIAGNOSIS — Z87442 Personal history of urinary calculi: Secondary | ICD-10-CM | POA: Diagnosis not present

## 2015-01-18 DIAGNOSIS — R42 Dizziness and giddiness: Secondary | ICD-10-CM | POA: Diagnosis not present

## 2015-01-18 DIAGNOSIS — R1013 Epigastric pain: Secondary | ICD-10-CM | POA: Insufficient documentation

## 2015-01-18 DIAGNOSIS — Z79899 Other long term (current) drug therapy: Secondary | ICD-10-CM | POA: Diagnosis not present

## 2015-01-18 DIAGNOSIS — R079 Chest pain, unspecified: Secondary | ICD-10-CM | POA: Insufficient documentation

## 2015-01-18 DIAGNOSIS — M1990 Unspecified osteoarthritis, unspecified site: Secondary | ICD-10-CM | POA: Diagnosis not present

## 2015-01-18 DIAGNOSIS — E785 Hyperlipidemia, unspecified: Secondary | ICD-10-CM | POA: Insufficient documentation

## 2015-01-18 DIAGNOSIS — R197 Diarrhea, unspecified: Secondary | ICD-10-CM | POA: Diagnosis not present

## 2015-01-18 DIAGNOSIS — R1031 Right lower quadrant pain: Secondary | ICD-10-CM | POA: Diagnosis not present

## 2015-01-18 DIAGNOSIS — M797 Fibromyalgia: Secondary | ICD-10-CM | POA: Insufficient documentation

## 2015-01-18 DIAGNOSIS — G43909 Migraine, unspecified, not intractable, without status migrainosus: Secondary | ICD-10-CM | POA: Insufficient documentation

## 2015-01-18 DIAGNOSIS — Z86711 Personal history of pulmonary embolism: Secondary | ICD-10-CM | POA: Insufficient documentation

## 2015-01-18 DIAGNOSIS — F329 Major depressive disorder, single episode, unspecified: Secondary | ICD-10-CM | POA: Diagnosis not present

## 2015-01-18 DIAGNOSIS — Z87828 Personal history of other (healed) physical injury and trauma: Secondary | ICD-10-CM | POA: Diagnosis not present

## 2015-01-18 DIAGNOSIS — Z8719 Personal history of other diseases of the digestive system: Secondary | ICD-10-CM | POA: Insufficient documentation

## 2015-01-18 DIAGNOSIS — F419 Anxiety disorder, unspecified: Secondary | ICD-10-CM | POA: Diagnosis not present

## 2015-01-18 DIAGNOSIS — M79661 Pain in right lower leg: Secondary | ICD-10-CM | POA: Diagnosis not present

## 2015-01-18 DIAGNOSIS — R0602 Shortness of breath: Secondary | ICD-10-CM | POA: Diagnosis not present

## 2015-01-18 DIAGNOSIS — R110 Nausea: Secondary | ICD-10-CM | POA: Diagnosis not present

## 2015-01-18 DIAGNOSIS — Z794 Long term (current) use of insulin: Secondary | ICD-10-CM | POA: Diagnosis not present

## 2015-01-18 LAB — I-STAT TROPONIN, ED: Troponin i, poc: 0 ng/mL (ref 0.00–0.08)

## 2015-01-18 LAB — URINE MICROSCOPIC-ADD ON
Bacteria, UA: NONE SEEN
RBC / HPF: NONE SEEN RBC/hpf (ref 0–5)

## 2015-01-18 LAB — CBC
HCT: 42 % (ref 36.0–46.0)
Hemoglobin: 14.3 g/dL (ref 12.0–15.0)
MCH: 32.6 pg (ref 26.0–34.0)
MCHC: 34 g/dL (ref 30.0–36.0)
MCV: 95.9 fL (ref 78.0–100.0)
Platelets: 493 10*3/uL — ABNORMAL HIGH (ref 150–400)
RBC: 4.38 MIL/uL (ref 3.87–5.11)
RDW: 13.4 % (ref 11.5–15.5)
WBC: 12.1 10*3/uL — ABNORMAL HIGH (ref 4.0–10.5)

## 2015-01-18 LAB — COMPLETE METABOLIC PANEL WITHOUT GFR
ALT: 66 U/L — ABNORMAL HIGH (ref 6–29)
AST: 43 U/L — ABNORMAL HIGH (ref 10–30)
Albumin: 4.3 g/dL (ref 3.6–5.1)
Alkaline Phosphatase: 200 U/L — ABNORMAL HIGH (ref 33–115)
BUN: 9 mg/dL (ref 7–25)
CO2: 28 mmol/L (ref 20–31)
Calcium: 9.9 mg/dL (ref 8.6–10.2)
Chloride: 99 mmol/L (ref 98–110)
Creat: 0.87 mg/dL (ref 0.50–1.10)
GFR, Est African American: >89 mL/min
GFR, Est Non African American: 82 mL/min
Glucose, Bld: 132 mg/dL — ABNORMAL HIGH (ref 65–99)
Potassium: 4.2 mmol/L (ref 3.5–5.3)
Sodium: 139 mmol/L (ref 135–146)
Total Bilirubin: 0.5 mg/dL (ref 0.2–1.2)
Total Protein: 8.1 g/dL (ref 6.1–8.1)

## 2015-01-18 LAB — URINALYSIS, ROUTINE W REFLEX MICROSCOPIC
Bilirubin Urine: NEGATIVE
Glucose, UA: >1000 mg/dL — AB
Hgb urine dipstick: NEGATIVE
Ketones, ur: NEGATIVE mg/dL
Leukocytes, UA: NEGATIVE
Nitrite: NEGATIVE
Protein, ur: NEGATIVE mg/dL
Specific Gravity, Urine: 1.036 — ABNORMAL HIGH (ref 1.005–1.030)
pH: 5 (ref 5.0–8.0)

## 2015-01-18 LAB — BASIC METABOLIC PANEL
Anion gap: 10 (ref 5–15)
BUN: 8 mg/dL (ref 6–20)
CO2: 28 mmol/L (ref 22–32)
Calcium: 9.5 mg/dL (ref 8.9–10.3)
Chloride: 100 mmol/L — ABNORMAL LOW (ref 101–111)
Creatinine, Ser: 1.03 mg/dL — ABNORMAL HIGH (ref 0.44–1.00)
GFR calc Af Amer: >60 mL/min (ref >60)
GFR calc non Af Amer: >60 mL/min (ref >60)
Glucose, Bld: 376 mg/dL — ABNORMAL HIGH (ref 65–99)
Potassium: 4.4 mmol/L (ref 3.5–5.1)
Sodium: 138 mmol/L (ref 135–145)

## 2015-01-18 LAB — LIPASE: Lipase: <5 U/L — ABNORMAL LOW (ref 7–60)

## 2015-01-18 NOTE — ED Notes (Signed)
Pt. reports right lateral/low abdominal pain with nausea and  dizziness onset yesterday , seen by her PCP yesterday for the same complaint , pt. added central chest pain onset this evening .

## 2015-01-19 ENCOUNTER — Ambulatory Visit (HOSPITAL_BASED_OUTPATIENT_CLINIC_OR_DEPARTMENT_OTHER)
Admission: RE | Admit: 2015-01-19 | Discharge: 2015-01-19 | Disposition: A | Discharge status: Home / Self Care | Payer: BLUE CROSS/BLUE SHIELD | Source: Ambulatory Visit | Attending: Emergency Medicine | Admitting: Emergency Medicine

## 2015-01-19 ENCOUNTER — Emergency Department (HOSPITAL_COMMUNITY): Payer: BLUE CROSS/BLUE SHIELD

## 2015-01-19 ENCOUNTER — Emergency Department (HOSPITAL_COMMUNITY)
Admission: EM | Admit: 2015-01-19 | Discharge: 2015-01-19 | Disposition: A | Discharge status: Home / Self Care | Payer: BLUE CROSS/BLUE SHIELD | Attending: Emergency Medicine | Admitting: Emergency Medicine

## 2015-01-19 ENCOUNTER — Telehealth: Payer: Self-pay | Admitting: Family Medicine

## 2015-01-19 ENCOUNTER — Ambulatory Visit (INDEPENDENT_AMBULATORY_CARE_PROVIDER_SITE_OTHER): Payer: BLUE CROSS/BLUE SHIELD | Admitting: Family Medicine

## 2015-01-19 ENCOUNTER — Encounter (HOSPITAL_COMMUNITY): Payer: Self-pay | Admitting: Radiology

## 2015-01-19 VITALS — BP 118/72 | HR 87 | Temp 98.5°F | Resp 18 | Ht 65.0 in | Wt 166.8 lb

## 2015-01-19 DIAGNOSIS — M79609 Pain in unspecified limb: Secondary | ICD-10-CM | POA: Diagnosis not present

## 2015-01-19 DIAGNOSIS — E785 Hyperlipidemia, unspecified: Secondary | ICD-10-CM | POA: Insufficient documentation

## 2015-01-19 DIAGNOSIS — K5901 Slow transit constipation: Secondary | ICD-10-CM | POA: Diagnosis not present

## 2015-01-19 DIAGNOSIS — R1084 Generalized abdominal pain: Secondary | ICD-10-CM

## 2015-01-19 DIAGNOSIS — H9202 Otalgia, left ear: Secondary | ICD-10-CM | POA: Diagnosis not present

## 2015-01-19 DIAGNOSIS — R079 Chest pain, unspecified: Secondary | ICD-10-CM

## 2015-01-19 DIAGNOSIS — G44219 Episodic tension-type headache, not intractable: Secondary | ICD-10-CM

## 2015-01-19 DIAGNOSIS — F411 Generalized anxiety disorder: Secondary | ICD-10-CM

## 2015-01-19 DIAGNOSIS — E1040 Type 1 diabetes mellitus with diabetic neuropathy, unspecified: Secondary | ICD-10-CM

## 2015-01-19 DIAGNOSIS — M79604 Pain in right leg: Secondary | ICD-10-CM | POA: Insufficient documentation

## 2015-01-19 LAB — I-STAT TROPONIN, ED: Troponin i, poc: 0 ng/mL (ref 0.00–0.08)

## 2015-01-19 MED ORDER — IOHEXOL 350 MG/ML SOLN
100.0000 mL | Freq: Once | INTRAVENOUS | Status: AC | PRN
Start: 2015-01-19 — End: 2015-01-19
  Administered 2015-01-19: 100 mL via INTRAVENOUS

## 2015-01-19 MED ORDER — HYDROCODONE-ACETAMINOPHEN 5-325 MG PO TABS
1.0000 | ORAL_TABLET | Freq: Once | ORAL | Status: AC
  Administered 2015-01-19: 1 via ORAL
  Filled 2015-01-19: qty 1

## 2015-01-19 MED ORDER — SODIUM CHLORIDE 0.9 % IV BOLUS (SEPSIS)
1000.0000 mL | Freq: Once | INTRAVENOUS | Status: AC
  Administered 2015-01-19: 1000 mL via INTRAVENOUS

## 2015-01-19 NOTE — Patient Instructions (Addendum)
Continue current medications  Take the MiraLAX as directed  If abdominal symptoms are not improving to get abdominal ultrasound or CT scan probably.  Return if worse or not improving  Try to drink plenty of fluids and get regular exercise as previously discussed.  Try flushing the left ear out with a little peroxide followed by some water. See if that would make it feel better to get the little bit of debris cleared out of there.

## 2015-01-19 NOTE — Discharge Instructions (Signed)
Have ultrasound of leg performed tomorrow or Monday at [facility].  If you were given medicines take as directed.  If you are on coumadin or contraceptives realize their levels and effectiveness is altered by many different medicines.  If you have any reaction (rash, tongues swelling, other) to the medicines stop taking and see a physician.    If your blood pressure was elevated in the ER make sure you follow up for management with a primary doctor or return for chest pain, shortness of breath or stroke symptoms.  Please follow up as directed and return to the ER or see a physician for new or worsening symptoms.  Thank you. Filed Vitals:   01/18/15 2042  BP: 150/73  Pulse: 87  Temp: 98.1 F (36.7 C)  TempSrc: Oral  Resp: 16  SpO2: 100%

## 2015-01-19 NOTE — ED Provider Notes (Signed)
CSN: [id number]     Arrival date & time 01/18/15  2015 History  By signing my name below, I, Irene Pap, attest that this documentation has been prepared under the direction and in the presence of Elnora Morrison, MD. Electronically Signed: Irene Pap, ED Scribe. 01/19/2015. 2:47 AM.  Chief Complaint  Patient presents with  . Abdominal Pain  . Chest Pain   The history is provided by the patient. No language interpreter was used.  HPI Comments: Maureen [name] is a 44 y.o. Female with a hx of Type I DM, fibromyalgia, abdominal pain, nausea, vomiting, PE, and hypoglycemia who presents to the Emergency Department complaining of right lateral and lower abdominal pain onset one day ago. She reports associated nausea and dizziness. She states that she is also experiencing pain to the medial right calf. Pt was seen yesterday for the same by her PCP. She states that she began to have gradually worsening central chest pain and SOB onset this evening. She reports hx of similar symptoms, and states that the last time she had these symptoms, she experienced a PE. Pt reports recently finishing Xarelto. She denies recent long travel, recent surgery, hx of heart disease, fever, chills, cough, hemoptysis, hematochezia, leg swelling, dysuria, or hematuria.   Past Medical History  Diagnosis Date  . 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 Fibromyalgia   . Hyperlipidemia   . Abdominal pain   . Nausea & vomiting   . Biliary dyskinesia   . Depression   . Joint pain   . Skin rash     due to medications  . Post traumatic stress disorder (PTSD)   . Sleep trouble   . Major depressive disorder ([place])   . Cervical strain   . Closed head injury 2010  . Renal stones   . Migraine     hx of none recent  . Arthritis   . Hypoglycemia   . Anxiety   . Pulmonary embolism [facility])    Past Surgical History  Procedure Laterality Date  . Lipoma removed  yrs ago  . Knee  surgery Left 2012  . Cesarean section  1997, 1999  . Cholecystectomy  02/10/2011    Procedure: LAPAROSCOPIC CHOLECYSTECTOMY WITH INTRAOPERATIVE CHOLANGIOGRAM;  Surgeon: Judieth Keens, DO;  Location: [place];  Service: General;  Laterality: N/A;  . Robotic assisted laparoscopic lysis of adhesion N/A 03/04/2012    Procedure: ROBOTIC ASSISTED LAPAROSCOPIC LYSIS OF EXTENSIVE ADHESIONS;  Surgeon: Claiborne Billings A. Pamala Hurry, MD;  Location: [facility];  Service: Gynecology;  Laterality: N/A;  . Tonsillectomy  2001  . Abdominal hysterectomy  2001  . Eye surgery  2006    laser eye surgery left eye  . Exploratory laparomty  Mar 04, 2012  . Laparoscopic appendectomy N/A 10/12/2012    Procedure: DIAGNOSTIC APPENDECTOMY LAPAROSCOPIC;  Surgeon: Madilyn Hook, DO;  Location: [facility];  Service: General;  Laterality: N/A;  . Appendectomy     Family History  Problem Relation Age of Onset  . Cancer Father     lymphoma  . Nephrolithiasis Father   . Vascular Disease Father   . Alcohol abuse Father   . Drug abuse Father   . Cancer Maternal Grandmother     colon  . Hypertension Mother   . Heart disease Mother   . Cataracts Mother   . Depression Mother   . Diabetes Brother   . Drug abuse Brother   . Mental illness Maternal Grandfather   .  Cancer Maternal Grandfather   . Heart disease Paternal Grandmother   . Heart disease Paternal Grandfather   . Suicidality Maternal Uncle    Social History  Substance Use Topics  . Smoking status: Never Smoker   . Smokeless tobacco: Never Used  . Alcohol Use: No   OB History    No data available     Review of Systems  Constitutional: Negative for fever, chills and diaphoresis.  Respiratory: Positive for shortness of breath. Negative for cough.   Cardiovascular: Positive for chest pain.  Gastrointestinal: Positive for nausea, abdominal pain and diarrhea. Negative for blood in stool.  Genitourinary: Negative for dysuria and hematuria.  Musculoskeletal: Positive for  arthralgias.  Neurological: Positive for dizziness.  All other systems reviewed and are negative.     Allergies  Cephalexin; Ibuprofen; and Meloxicam  Home Medications   Prior to Admission medications   Medication Sig Start Date End Date Taking? Authorizing Provider  acetaminophen (TYLENOL) 500 MG tablet Take 500 mg by mouth every 6 (six) hours as needed for mild pain.   Yes Historical Provider, MD  cyproheptadine (PERIACTIN) 4 MG tablet Take 1/2-1 pill up to 3 times daily as needed for itching 01/17/15  Yes Posey Boyer, MD  FLUoxetine (PROZAC) 40 MG capsule Take 40 mg by mouth daily.   Yes Historical Provider, MD  insulin aspart (NOVOLOG) 100 UNIT/ML injection Inject 8 Units into the skin 3 (three) times daily with meals. 10/16/13  Yes Kerrie Buffalo, NP  insulin glargine (LANTUS) 100 UNIT/ML injection Inject 26 Units into the skin at bedtime.    Yes Historical Provider, MD  atorvastatin (LIPITOR) 40 MG tablet Take 1 tablet (40 mg total) by mouth daily. Patient not taking: Reported on 01/17/2015 12/05/14   Gay Filler Copland, MD   BP 150/73 mmHg  Pulse 87  Temp(Src) 98.1 F (36.7 C) (Oral)  Resp 16  SpO2 100% Physical Exam  Constitutional: She is oriented to person, place, and time. She appears well-developed and well-nourished.  HENT:  Head: Normocephalic and atraumatic.  Eyes: EOM are normal. Pupils are equal, round, and reactive to light.  Neck: Normal range of motion. Neck supple.  Cardiovascular: Normal rate, regular rhythm and normal heart sounds.  Exam reveals no gallop and no friction rub.   No murmur heard. Pulmonary/Chest: Effort normal and breath sounds normal. No respiratory distress.  Abdominal: Soft. There is tenderness in the epigastric area.  Mild tenderness to the epigastrium  Musculoskeletal: Normal range of motion.  No significant swelling to the bilateral legs; mild TTP to the medial right calf  Neurological: She is alert and oriented to person, place,  and time.  Skin: Skin is warm and dry.  Psychiatric: She has a normal mood and affect. Her behavior is normal.  Nursing note and vitals reviewed.   ED Course  Procedures (including critical care time) DIAGNOSTIC STUDIES: Oxygen Saturation is 100% on RA, normal by my interpretation.    COORDINATION OF CARE: 1:18 AM-Discussed treatment plan which includes CT scan and labs with pt at bedside and pt agreed to plan.    Labs Review Labs Reviewed  BASIC METABOLIC PANEL - Abnormal; Notable for the following:    Chloride 100 (*)    Glucose, Bld 376 (*)    Creatinine, Ser 1.03 (*)    All other components within normal limits  CBC - Abnormal; Notable for the following:    WBC 12.1 (*)    Platelets 493 (*)    All other  components within normal limits  URINALYSIS, ROUTINE W REFLEX MICROSCOPIC (NOT AT [facility]) - Abnormal; Notable for the following:    Specific Gravity, Urine 1.036 (*)    Glucose, UA >1000 (*)    All other components within normal limits  URINE MICROSCOPIC-ADD ON - Abnormal; Notable for the following:    Squamous Epithelial / LPF 6-30 (*)    All other components within normal limits  I-STAT TROPOININ, ED  Randolm Idol, ED    Imaging Review Dg Chest 2 View  01/18/2015  CLINICAL DATA:  44 year old female with shortness of breath and chest pain EXAM: CHEST  2 VIEW COMPARISON:  Radiograph dated 10/18/2014 FINDINGS: The heart size and mediastinal contours are within normal limits. Both lungs are clear. The visualized skeletal structures are unremarkable. IMPRESSION: No active cardiopulmonary disease. Electronically Signed   By: Anner Crete M.D.   On: 01/18/2015 22:30   Ct Angio Chest Pe W/cm &/or Wo Cm  01/19/2015  CLINICAL DATA:  44 year old female with mid chest pain EXAM: CT ANGIOGRAPHY CHEST WITH CONTRAST TECHNIQUE: Multidetector CT imaging of the chest was performed using the standard protocol during bolus administration of intravenous contrast. Multiplanar CT  image reconstructions and MIPs were obtained to evaluate the vascular anatomy. CONTRAST:  117mL OMNIPAQUE IOHEXOL 350 MG/ML SOLN COMPARISON:  Radiograph dated 01/18/2015 FINDINGS: There are two pulmonary nodules in the right middle lobe each measuring approximately 3 mm. There is no focal consolidation, pleural effusion, or pneumothorax. The central airways are patent. The thoracic aorta appears unremarkable. No CT evidence of pulmonary embolism. There is no cardiomegaly or pericardial effusion. No hilar or mediastinal adenopathy. The esophagus and thyroid gland appear grossly unremarkable. There is no axillary adenopathy. The chest wall soft tissues appear unremarkable. The osseous structures are intact. Fatty infiltration of the liver. Cholecystectomy. The visualized upper abdomen is otherwise unremarkable. Review of the MIP images confirms the above findings. IMPRESSION: No CT evidence of pulmonary embolism. Electronically Signed   By: Anner Crete M.D.   On: 01/19/2015 02:33   Dg Abd 2 Views  01/17/2015  CLINICAL DATA:  Abdominal pain for 2 days EXAM: ABDOMEN - 2 VIEW COMPARISON:  09/03/2014 FINDINGS: Scattered large and small bowel gas is noted. A considerable amount of fecal material is noted within the colon consistent with constipation. No obstructive changes are seen. No free air is noted. No bony abnormality is noted. IMPRESSION: Changes consistent with constipation. No other focal abnormality is seen. Electronically Signed   By: Inez Catalina M.D.   On: 01/17/2015 15:51   I have personally reviewed and evaluated these images and lab results as part of my medical decision-making.   EKG Interpretation   Date/Time:  Friday January 18 2015 20:50:17 EST Ventricular Rate:  86 PR Interval:  122 QRS Duration: 72 QT Interval:  364 QTC Calculation: 435 R Axis:   93 Text Interpretation:  Normal sinus rhythm Rightward axis Borderline ECG  Confirmed by Jamonica Schoff  MD, Yasmin Bronaugh ([id number]) on 01/19/2015  12:52:53 AM      MDM   Final diagnoses:  Chest pain, unspecified chest pain type   I personally performed the services described in this documentation, which was scribed in my presence. The recorded information has been reviewed and is accurate.  Patient presents with epigastric abdominal pain and lower central chest pain. Patient low risk cardiac. Patient has had blood clot in the past. CT angina ordered no blood clot/pulmonary wasn't. Patient has mild right medial knee pain. Discussed outpatient ultrasound performed  tomorrow. Delta troponin negative.  Results and differential diagnosis were discussed with the patient/parent/guardian. Xrays were independently reviewed by myself.  Close follow up outpatient was discussed, comfortable with the plan.   Medications  sodium chloride 0.9 % bolus 1,000 mL (1,000 mLs Intravenous New Bag/Given 01/19/15 0154)  iohexol (OMNIPAQUE) 350 MG/ML injection 100 mL (100 mLs Intravenous Contrast Given 01/19/15 0215)    Filed Vitals:   01/18/15 2042  BP: 150/73  Pulse: 87  Temp: 98.1 F (36.7 C)  TempSrc: Oral  Resp: 16  SpO2: 100%    Final diagnoses:  Chest pain, unspecified chest pain type      Elnora Morrison, MD 01/19/15 [id number]

## 2015-01-19 NOTE — Progress Notes (Signed)
VASCULAR LAB PRELIMINARY  PRELIMINARY  PRELIMINARY  PRELIMINARY  Right lower extremity venous duplex completed.    Preliminary report:  There is no DVT or SVT noted in the right lower extremity.   Jay Kempe, RVT 01/19/2015, 9:00 AM

## 2015-01-19 NOTE — ED Notes (Signed)
Pt requested something for pain 

## 2015-01-19 NOTE — Telephone Encounter (Signed)
Called in for status update: PT is experiencing pain. Would like call back @ [phone]
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
## 2015-01-20 NOTE — Telephone Encounter (Signed)
IC pt @ [phone], no answer.  LMOVM for pt to return call and update [place] on her continuing pain.

## 2015-01-20 NOTE — Telephone Encounter (Signed)
Pt returned call.  States she has been taking Miralax (2 doses, not 3 as prescribed) and is still in a lot of pain. Advised her to continue the Miralax another 24-48 hours and see if the pain will resolve.   She agreed and appreciated the amount of time Dr. Linna Darner spent with her yesterday 12/31!!!

## 2015-01-23 ENCOUNTER — Ambulatory Visit (INDEPENDENT_AMBULATORY_CARE_PROVIDER_SITE_OTHER): Payer: Medicare Other | Admitting: Family Medicine

## 2015-01-23 VITALS — BP 112/56 | HR 91 | Temp 98.6°F | Resp 14 | Ht 64.5 in | Wt 169.4 lb

## 2015-01-23 DIAGNOSIS — R748 Abnormal levels of other serum enzymes: Secondary | ICD-10-CM

## 2015-01-23 DIAGNOSIS — R110 Nausea: Secondary | ICD-10-CM | POA: Diagnosis not present

## 2015-01-23 DIAGNOSIS — E108 Type 1 diabetes mellitus with unspecified complications: Secondary | ICD-10-CM | POA: Diagnosis not present

## 2015-01-23 DIAGNOSIS — R1084 Generalized abdominal pain: Secondary | ICD-10-CM

## 2015-01-23 DIAGNOSIS — G4700 Insomnia, unspecified: Secondary | ICD-10-CM | POA: Diagnosis not present

## 2015-01-23 MED ORDER — ZOLPIDEM TARTRATE 5 MG PO TABS
5.0000 mg | ORAL_TABLET | Freq: Every evening | ORAL | Status: DC | PRN
Start: 2015-01-23 — End: 2015-05-02

## 2015-01-23 NOTE — Telephone Encounter (Signed)
Pt states she is still feeling nausea/pain,please advise,   Best phone for pt is [phone]   Pharmacy [facility]

## 2015-01-23 NOTE — Progress Notes (Signed)
Chief Complaint:  Chief Complaint  Patient presents with  . Follow-up    abdominal pain    HPI: Maureen [name] is a 45 y.o. female who reports to [facility] today complaining of fu regarding abd pain. She is here for abd pain which she saw  Dr Linna Darner starting on 12/29, then went to ED and also 12/31, she also has some other issues. She states that she has had similar sxs in the [place] She was seen here bu Dr Linna Darner on 01/19/15 after follow-up from ER, ER note is below. Dr Linna Darner put her on miralax for her constipation. She has had multiple abd surgeries. She is passing gas, she has had abnormal liver enzymes in the past , was hospitalized for this and dx with transaminitis and seen by GI who did a liver bx and thought it was related to her other chronic medical problems. She is to fu in 2 weeks to get it rechecked.   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  She has a complicated family hx, has had issue with her ex/estranged husband, she was vague about details, she did not delve into the details of this but all is EPIC and is being seen at [facility], she states there was legal issues but during this time she had to raise her 2 sons, they are doing well overall and one is going to go to go [employer].  She has Dperession, anxiety, PTSD and was on benzos in the past and currently declines to be on anything addictive .  She was recently in ER for ruleout of PE since has a hx of it. REcently off Xarelto. Recent abdominal xray and also CT Cehst are below.    01/17/2015 Abd xray  FINDINGS: Scattered large and small bowel gas is noted. A considerable amount of fecal material is noted within the colon consistent with constipation. No obstructive changes are seen. No free air is noted. No bony abnormality is  noted.  IMPRESSION: Changes consistent with constipation. No other focal abnormality is seen.   Electronically Signed  By: Inez Catalina M.D.  On: 01/17/2015 15:51  CT chest 01/19/2015 TECHNIQUE: Multidetector CT imaging of the chest was performed using the standard protocol during bolus administration of intravenous contrast. Multiplanar CT image reconstructions and MIPs were obtained to evaluate the vascular anatomy.  CONTRAST: 152mL OMNIPAQUE IOHEXOL 350 MG/ML SOLN  COMPARISON: Radiograph dated 01/18/2015  FINDINGS: There are two pulmonary nodules in the right middle lobe each measuring approximately 3 mm. There is no focal consolidation, pleural effusion, or pneumothorax. The central airways are patent.  The thoracic aorta appears unremarkable. No CT evidence of pulmonary embolism. There is no cardiomegaly or pericardial effusion. No hilar or mediastinal adenopathy.  The esophagus and thyroid gland appear grossly unremarkable.  There is no axillary adenopathy. The chest wall soft tissues appear unremarkable. The osseous structures are intact.  Fatty infiltration of the liver. Cholecystectomy. The visualized upper abdomen is otherwise unremarkable.  Review of the MIP images confirms the above findings.  IMPRESSION: No CT evidence of pulmonary embolism.   Electronically Signed  By: Anner Crete M.D.  On: 01/19/2015 02:33  Past Medical History  Diagnosis Date  . 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 Fibromyalgia   .  Hyperlipidemia   . Abdominal pain   . Nausea & vomiting   . Biliary dyskinesia   . Depression   . Joint pain   . Skin rash     due to medications  . Post traumatic stress disorder (PTSD)   . Sleep trouble   . Major depressive disorder ([place])   . Cervical strain   . Closed head injury 2010  . Renal stones   . Migraine     hx of none recent  . Arthritis   . Hypoglycemia   .  Anxiety   . Pulmonary embolism [facility])    Past Surgical History  Procedure Laterality Date  . Lipoma removed  yrs ago  . Knee surgery Left 2012  . Cesarean section  1997, 1999  . Cholecystectomy  02/10/2011    Procedure: LAPAROSCOPIC CHOLECYSTECTOMY WITH INTRAOPERATIVE CHOLANGIOGRAM;  Surgeon: Judieth Keens, DO;  Location: [place];  Service: General;  Laterality: N/A;  . Robotic assisted laparoscopic lysis of adhesion N/A 03/04/2012    Procedure: ROBOTIC ASSISTED LAPAROSCOPIC LYSIS OF EXTENSIVE ADHESIONS;  Surgeon: Claiborne Billings A. Pamala Hurry, MD;  Location: [place] [facility];  Service: Gynecology;  Laterality: N/A;  . Tonsillectomy  2001  . Abdominal hysterectomy  2001  . Eye surgery  2006    laser eye surgery left eye  . Exploratory laparomty  Mar 04, 2012  . Laparoscopic appendectomy N/A 10/12/2012    Procedure: DIAGNOSTIC APPENDECTOMY LAPAROSCOPIC;  Surgeon: Madilyn Hook, DO;  Location: [facility];  Service: General;  Laterality: N/A;  . Appendectomy     Social History   Social History  . Marital Status: Divorced    Spouse Name: Aleeha Logalbo  . Number of Children: 2  . Years of Education: 12+   Occupational History  . stay at home mom    Social History Main Topics  . Smoking status: Never Smoker   . Smokeless tobacco: Never Used  . Alcohol Use: No  . Drug Use: No  . Sexual Activity: Not Asked   Other Topics Concern  . None   Social History Narrative   Lives with husband and two sons.  Before going back to school, she was a Research scientist (physical sciences) at [employer].  Stop working when she was diagnosed with endometriosis and had a TIA.   Family History  Problem Relation Age of Onset  . Cancer Father     lymphoma  . Nephrolithiasis Father   . Vascular Disease Father   . Alcohol abuse Father   . Drug abuse Father   . Cancer Maternal Grandmother     colon  . Hypertension Mother   . Heart disease Mother   . Cataracts Mother   . Depression Mother   . Diabetes Brother   . Drug abuse  Brother   . Mental illness Maternal Grandfather   . Cancer Maternal Grandfather   . Heart disease Paternal Grandmother   . Heart disease Paternal Grandfather   . Suicidality Maternal Uncle    Allergies  Allergen Reactions  . Cephalexin Rash    Pt was admitted to the hospital upon taking.  . Ibuprofen Nausea And Vomiting and Rash  . Meloxicam Nausea Only   Prior to Admission medications   Medication Sig Start Date End Date Taking? Authorizing Provider  atorvastatin (LIPITOR) 40 MG tablet Take 1 tablet (40 mg total) by mouth daily. 12/05/14  Yes Gay Filler Copland, MD  cyproheptadine (PERIACTIN) 4 MG tablet Take 1/2-1 pill up to 3 times daily as needed  for itching 01/17/15  Yes Posey Boyer, MD  FLUoxetine (PROZAC) 40 MG capsule Take 40 mg by mouth daily.   Yes Historical Provider, MD  insulin aspart (NOVOLOG) 100 UNIT/ML injection Inject 8 Units into the skin 3 (three) times daily with meals. 10/16/13  Yes Kerrie Buffalo, NP  insulin glargine (LANTUS) 100 UNIT/ML injection Inject 26 Units into the skin at bedtime.    Yes Historical Provider, MD  acetaminophen (TYLENOL) 500 MG tablet Take 500 mg by mouth every 6 (six) hours as needed for mild pain. Reported on 01/23/2015    Historical Provider, MD     ROS: The patient denies fevers, chills, night sweats, unintentional weight loss, chest pain, palpitations, wheezing, dyspnea on exertion,vomiting,  dysuria, hematuria, melena, numbness, weakness, or tingling.   All other systems have been reviewed and were otherwise negative with the exception of those mentioned in the HPI and as above.    PHYSICAL EXAM: Filed Vitals:   01/23/15 2012  BP: 112/56  Pulse: 91  Temp: 98.6 F (37 C)  Resp: 14   Body mass index is 28.64 kg/(m^2).   General: Alert, no acute distress HEENT:  Normocephalic, atraumatic, oropharynx patent. EOMI, PERRLA Cardiovascular:  Regular rate and rhythm, no rubs murmurs or gallops.  No Carotid bruits, radial pulse  intact. No pedal edema.  Respiratory: Clear to auscultation bilaterally.  No wheezes, rales, or rhonchi.  No cyanosis, no use of accessory musculature Abdominal: No organomegaly, abdomen is soft and non/min diffusely tender, positive bowel sounds. No masses. Skin: No rashes. Neurologic: Facial musculature symmetric. Psychiatric: Patient acts appropriately throughout our interaction. Lymphatic: No cervical or submandibular lymphadenopathy Musculoskeletal: Gait intact. No edema, tenderness   LABS: Results for orders placed or performed during the hospital encounter of [id number]  Basic metabolic panel  Result Value Ref Range   Sodium 138 135 - 145 mmol/L   Potassium 4.4 3.5 - 5.1 mmol/L   Chloride 100 (L) 101 - 111 mmol/L   CO2 28 22 - 32 mmol/L   Glucose, Bld 376 (H) 65 - 99 mg/dL   BUN 8 6 - 20 mg/dL   Creatinine, Ser 1.03 (H) 0.44 - 1.00 mg/dL   Calcium 9.5 8.9 - 10.3 mg/dL   GFR calc non Af Amer >60 >60 mL/min   GFR calc Af Amer >60 >60 mL/min   Anion gap 10 5 - 15  CBC  Result Value Ref Range   WBC 12.1 (H) 4.0 - 10.5 K/uL   RBC 4.38 3.87 - 5.11 MIL/uL   Hemoglobin 14.3 12.0 - 15.0 g/dL   HCT 42.0 36.0 - 46.0 %   MCV 95.9 78.0 - 100.0 fL   MCH 32.6 26.0 - 34.0 pg   MCHC 34.0 30.0 - 36.0 g/dL   RDW 13.4 11.5 - 15.5 %   Platelets 493 (H) 150 - 400 K/uL  Urinalysis, Routine w reflex microscopic (not at [facility])  Result Value Ref Range   Color, Urine YELLOW YELLOW   APPearance CLEAR CLEAR   Specific Gravity, Urine 1.036 (H) 1.005 - 1.030   pH 5.0 5.0 - 8.0   Glucose, UA >1000 (A) NEGATIVE mg/dL   Hgb urine dipstick NEGATIVE NEGATIVE   Bilirubin Urine NEGATIVE NEGATIVE   Ketones, ur NEGATIVE NEGATIVE mg/dL   Protein, ur NEGATIVE NEGATIVE mg/dL   Nitrite NEGATIVE NEGATIVE   Leukocytes, UA NEGATIVE NEGATIVE  Urine microscopic-add on  Result Value Ref Range   Squamous Epithelial / LPF 6-30 (A) NONE SEEN   WBC,  UA 0-5 0 - 5 WBC/hpf   RBC / HPF NONE SEEN 0 - 5 RBC/hpf    Bacteria, UA NONE SEEN NONE SEEN  I-stat troponin, ED (not at [facility])  Result Value Ref Range   Troponin i, poc 0.00 0.00 - 0.08 ng/mL   Comment 3          I-stat troponin, ED  Result Value Ref Range   Troponin i, poc 0.00 0.00 - 0.08 ng/mL   Comment 3             EKG/XRAY:   Primary read interpreted by Dr. Marin Comment at [facility].   ASSESSMENT/PLAN: Encounter Diagnoses  Name Primary?  . Generalized abdominal pain Yes  . Type 1 diabetes mellitus with complication ([place])   . Abnormal liver enzymes   . Nausea without vomiting   . Insomnia    45 y/o AA female with PMH of mutilple abd surgeries, fibromyalgia/anxiety/depression/PTSD, family hx of drug abuse ( father with etoh and also brother on drugs) hospitalizations for overdose on ASA in the past, poorly controlled Type 1 DM with intermittent high sugars with use of SSI  and also what may sound like gastroparesis who is here for fu of abdominal pain. This is an acute on chronic issue but I think her issues are mor psychiatric related than anything else. She was being followed by [facility]. No SI/HI/Hallucinations She has had cholecystectomy, appendectomy, hysterectomy, last CT abd and pelvis in 08/2014 was unremarkable. She cannot sleep well. Lots of stressors but slightly improving , already on prozac, does not want me to add anything else She will fu wit Dr Linna Darner in 1 week or prn , declines to get labs today to see if liver enzymes elevated today, Advise to return in 1 week to get them done,she nay have pruritis from elevated liver enzymes or anxiety, seems to  Be improving on antihistamine.  We went through different meds she is will to try to help with stress and sleep, no benzos due to fear of addiction, but is willing to try ambien lower does of 5 mg, she has had 10 mg in the past without complications.  Fu in 1 week to recheck liver enzymes. She states she has not been on her lipitor in a long time.  Refer to GI for ? Functional  bowel disorder. She has seen Dr Benson Norway in the past.   Gross sideeffects, risk and benefits, and alternatives of medications d/w patient. Patient is aware that all medications have potential sideeffects and we are unable to predict every sideeffect or drug-drug interaction that may occur.  Bebe Moncure DO  01/23/2015 9:36 PM

## 2015-01-24 NOTE — Telephone Encounter (Signed)
Pt came in.

## 2015-01-25 ENCOUNTER — Encounter (HOSPITAL_COMMUNITY): Payer: Self-pay | Admitting: Emergency Medicine

## 2015-01-25 ENCOUNTER — Emergency Department (HOSPITAL_COMMUNITY)
Admission: EM | Admit: 2015-01-25 | Discharge: 2015-01-25 | Disposition: A | Discharge status: Home / Self Care | Payer: BLUE CROSS/BLUE SHIELD | Attending: Physician Assistant | Admitting: Physician Assistant

## 2015-01-25 DIAGNOSIS — M79641 Pain in right hand: Secondary | ICD-10-CM | POA: Diagnosis not present

## 2015-01-25 DIAGNOSIS — Z86711 Personal history of pulmonary embolism: Secondary | ICD-10-CM | POA: Insufficient documentation

## 2015-01-25 DIAGNOSIS — M797 Fibromyalgia: Secondary | ICD-10-CM | POA: Insufficient documentation

## 2015-01-25 DIAGNOSIS — M1990 Unspecified osteoarthritis, unspecified site: Secondary | ICD-10-CM | POA: Diagnosis not present

## 2015-01-25 DIAGNOSIS — E1040 Type 1 diabetes mellitus with diabetic neuropathy, unspecified: Secondary | ICD-10-CM | POA: Diagnosis not present

## 2015-01-25 DIAGNOSIS — E785 Hyperlipidemia, unspecified: Secondary | ICD-10-CM | POA: Insufficient documentation

## 2015-01-25 DIAGNOSIS — Z8719 Personal history of other diseases of the digestive system: Secondary | ICD-10-CM | POA: Insufficient documentation

## 2015-01-25 DIAGNOSIS — Z79899 Other long term (current) drug therapy: Secondary | ICD-10-CM | POA: Insufficient documentation

## 2015-01-25 DIAGNOSIS — F419 Anxiety disorder, unspecified: Secondary | ICD-10-CM | POA: Diagnosis not present

## 2015-01-25 DIAGNOSIS — F329 Major depressive disorder, single episode, unspecified: Secondary | ICD-10-CM | POA: Diagnosis not present

## 2015-01-25 DIAGNOSIS — Z794 Long term (current) use of insulin: Secondary | ICD-10-CM | POA: Diagnosis not present

## 2015-01-25 MED ORDER — HYDROCODONE-ACETAMINOPHEN 5-325 MG PO TABS: 1.0000 | ORAL_TABLET | Freq: Four times a day (QID) | ORAL | Status: DC | PRN

## 2015-01-25 NOTE — Discharge Instructions (Signed)
Carpal Tunnel Syndrome Carpal tunnel syndrome is a condition that causes pain in your hand and arm. The carpal tunnel is a narrow area located on the palm side of your wrist. Repeated wrist motion or certain diseases may cause swelling within the tunnel. This swelling pinches the main nerve in the wrist (median nerve). CAUSES  This condition may be caused by:   Repeated wrist motions.  Wrist injuries.  Arthritis.  A cyst or tumor in the carpal tunnel.  Fluid buildup during pregnancy. Sometimes the cause of this condition is not known.  RISK FACTORS This condition is more likely to develop in:   People who have jobs that cause them to repeatedly move their wrists in the same motion, such as butchers and cashiers.  Women.  People with certain conditions, such as:  Diabetes.  Obesity.  An underactive thyroid (hypothyroidism).  Kidney failure. SYMPTOMS  Symptoms of this condition include:   A tingling feeling in your fingers, especially in your thumb, index, and middle fingers.  Tingling or numbness in your hand.  An aching feeling in your entire arm, especially when your wrist and elbow are bent for long periods of time.  Wrist pain that goes up your arm to your shoulder.  Pain that goes down into your palm or fingers.  A weak feeling in your hands. You may have trouble grabbing and holding items. Your symptoms may feel worse during the night.  DIAGNOSIS  This condition is diagnosed with a medical history and physical exam. You may also have tests, including:   An electromyogram (EMG). This test measures electrical signals sent by your nerves into the muscles.  X-rays. TREATMENT  Treatment for this condition includes:  Lifestyle changes. It is important to stop doing or modify the activity that caused your condition.  Physical or occupational therapy.  Medicines for pain and inflammation. This may include medicine that is injected into your wrist.  A wrist  splint.  Surgery. HOME CARE INSTRUCTIONS  If You Have a Splint:  Wear it as told by your health care provider. Remove it only as told by your health care provider.  Loosen the splint if your fingers become numb and tingle, or if they turn cold and blue.  Keep the splint clean and dry. General Instructions  Take over-the-counter and prescription medicines only as told by your health care provider.  Rest your wrist from any activity that may be causing your pain. If your condition is work related, talk to your employer about changes that can be made, such as getting a wrist pad to use while typing.  If directed, apply ice to the painful area:  Put ice in a plastic bag.  Place a towel between your skin and the bag.  Leave the ice on for 20 minutes, 2-3 times per day.  Keep all follow-up visits as told by your health care provider. This is important.  Do any exercises as told by your health care provider, physical therapist, or occupational therapist. [place] IF:   You have new symptoms.  Your pain is not controlled with medicines.  Your symptoms get worse.   This information is not intended to replace advice given to you by your health care provider. Make sure you discuss any questions you have with your health care provider.   Document Released: 01/03/2000 Document Revised: 09/26/2014 Document Reviewed: 05/23/2014 Elsevier Interactive Patient Education 2016 [place] Pain Joint pain, which is also called arthralgia, can be caused by  many things. Joint pain often goes away when you follow your health care provider's instructions for relieving pain at home. However, joint pain can also be caused by conditions that require further treatment. Common causes of joint pain include:  Bruising in the area of the joint.  Overuse of the joint.  Wear and tear on the joints that occur with aging (osteoarthritis).  Various other forms of arthritis.  A buildup  of a crystal form of uric acid in the joint (gout).  Infections of the joint (septic arthritis) or of the bone (osteomyelitis). Your health care provider may recommend medicine to help with the pain. If your joint pain continues, additional tests may be needed to diagnose your condition. HOME CARE INSTRUCTIONS Watch your condition for any changes. Follow these instructions as directed to lessen the pain that you are feeling.  Take medicines only as directed by your health care provider.  Rest the affected area for as long as your health care provider says that you should. If directed to do so, raise the painful joint above the level of your heart while you are sitting or lying down.  Do not do things that cause or worsen pain.  If directed, apply ice to the painful area:  Put ice in a plastic bag.  Place a towel between your skin and the bag.  Leave the ice on for 20 minutes, 2-3 times per day.  Wear an elastic bandage, splint, or sling as directed by your health care provider. Loosen the elastic bandage or splint if your fingers or toes become numb and tingle, or if they turn cold and blue.  Begin exercising or stretching the affected area as directed by your health care provider. Ask your health care provider what types of exercise are safe for you.  Keep all follow-up visits as directed by your health care provider. This is important. SEEK MEDICAL CARE IF:  Your pain increases, and medicine does not help.  Your joint pain does not improve within 3 days.  You have increased bruising or swelling.  You have a fever.  You lose 10 lb (4.5 kg) or more without trying. SEEK IMMEDIATE MEDICAL CARE IF:  You are not able to move the joint.  Your fingers or toes become numb or they turn cold and blue.   This information is not intended to replace advice given to you by your health care provider. Make sure you discuss any questions you have with your health care provider.     Document Released: 01/05/2005 Document Revised: 01/26/2014 Document Reviewed: 10/17/2013 Elsevier Interactive Patient Education [employer].

## 2015-01-25 NOTE — ED Provider Notes (Signed)
CSN: [id number]     Arrival date & time 01/25/15  1227 History  By signing my name below, I, Maureen [name], attest that this documentation has been prepared under the direction and in the presence of Montine Circle, PA-C Electronically Signed: Ladene Artist, ED Scribe 01/25/2015 at 12:55 PM.   Chief Complaint  Patient presents with  . Hand Pain   The history is provided by the patient. No language interpreter was used.   HPI Comments: Maureen [name] is a 45 y.o. female, with a h/o arthritis and fibromyalgia, who presents to the Emergency Department with a chief complaint of constant, 7/10 right hand pain onset earlier today. Pt states that she typically has mild right hand pain since a fracture a few years ago, but states that this pain is more severe and is exacerbated with gripping. No recent injury. She has tried warm compresses without significant relief. Allergy to ibuprofen.   Past Medical History  Diagnosis Date  . 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 Fibromyalgia   . Hyperlipidemia   . Abdominal pain   . Nausea & vomiting   . Biliary dyskinesia   . Depression   . Joint pain   . Skin rash     due to medications  . Post traumatic stress disorder (PTSD)   . Sleep trouble   . Major depressive disorder ([place])   . Cervical strain   . Closed head injury 2010  . Renal stones   . Migraine     hx of none recent  . Arthritis   . Hypoglycemia   . Anxiety   . Pulmonary embolism [facility])    Past Surgical History  Procedure Laterality Date  . Lipoma removed  yrs ago  . Knee surgery Left 2012  . Cesarean section  1997, 1999  . Cholecystectomy  02/10/2011    Procedure: LAPAROSCOPIC CHOLECYSTECTOMY WITH INTRAOPERATIVE CHOLANGIOGRAM;  Surgeon: Judieth Keens, DO;  Location: [name];  Service: General;  Laterality: N/A;  . Robotic assisted laparoscopic lysis of adhesion N/A 03/04/2012    Procedure: ROBOTIC ASSISTED LAPAROSCOPIC LYSIS OF  EXTENSIVE ADHESIONS;  Surgeon: Claiborne Billings A. Pamala Hurry, MD;  Location: [facility];  Service: Gynecology;  Laterality: N/A;  . Tonsillectomy  2001  . Abdominal hysterectomy  2001  . Eye surgery  2006    laser eye surgery left eye  . Exploratory laparomty  Mar 04, 2012  . Laparoscopic appendectomy N/A 10/12/2012    Procedure: DIAGNOSTIC APPENDECTOMY LAPAROSCOPIC;  Surgeon: Madilyn Hook, DO;  Location: [facility];  Service: General;  Laterality: N/A;  . Appendectomy     Family History  Problem Relation Age of Onset  . Cancer Father     lymphoma  . Nephrolithiasis Father   . Vascular Disease Father   . Alcohol abuse Father   . Drug abuse Father   . Cancer Maternal Grandmother     colon  . Hypertension Mother   . Heart disease Mother   . Cataracts Mother   . Depression Mother   . Diabetes Brother   . Drug abuse Brother   . Mental illness Maternal Grandfather   . Cancer Maternal Grandfather   . Heart disease Paternal Grandmother   . Heart disease Paternal Grandfather   . Suicidality Maternal Uncle    Social History  Substance Use Topics  . Smoking status: Never Smoker   . Smokeless tobacco: Never Used  . Alcohol Use: No   OB History  No data available     Review of Systems  Constitutional: Negative for fever and chills.  Respiratory: Negative for shortness of breath.   Cardiovascular: Negative for chest pain.  Gastrointestinal: Negative for nausea, vomiting, diarrhea and constipation.  Genitourinary: Negative for dysuria.  Musculoskeletal: Positive for arthralgias.   Allergies  Cephalexin; Ibuprofen; and Meloxicam  Home Medications   Prior to Admission medications   Medication Sig Start Date End Date Taking? Authorizing Provider  acetaminophen (TYLENOL) 500 MG tablet Take 500 mg by mouth every 6 (six) hours as needed for mild pain. Reported on 01/23/2015    Historical Provider, MD  atorvastatin (LIPITOR) 40 MG tablet Take 1 tablet (40 mg total) by mouth daily. 12/05/14   Darreld Mclean, MD  cyproheptadine (PERIACTIN) 4 MG tablet Take 1/2-1 pill up to 3 times daily as needed for itching 01/17/15   Posey Boyer, MD  FLUoxetine (PROZAC) 40 MG capsule Take 40 mg by mouth daily.    Historical Provider, MD  insulin aspart (NOVOLOG) 100 UNIT/ML injection Inject 8 Units into the skin 3 (three) times daily with meals. 10/16/13   Kerrie Buffalo, NP  insulin glargine (LANTUS) 100 UNIT/ML injection Inject 26 Units into the skin at bedtime.     Historical Provider, MD  zolpidem (AMBIEN) 5 MG tablet Take 1 tablet (5 mg total) by mouth at bedtime as needed for sleep. 01/23/15   Thao P Le, DO   BP 126/70 mmHg  Pulse 111  Temp(Src) 98.2 F (36.8 C) (Oral)  Resp 18  SpO2 100% Physical Exam  Constitutional: She is oriented to person, place, and time. She appears well-developed and well-nourished. No distress.  HENT:  Head: Normocephalic and atraumatic.  Eyes: Conjunctivae and EOM are normal.  Neck: Neck supple. No tracheal deviation present.  Cardiovascular: Normal rate and intact distal pulses.   Pulmonary/Chest: Effort normal. No respiratory distress.  Musculoskeletal: Normal range of motion.  Positive tinel. Positive phalen. No bony abnormality or deformity. ROM 5/5. Strength 4/5 limited by pain. Sensation intact.   Neurological: She is alert and oriented to person, place, and time.  Skin: Skin is warm and dry.  Psychiatric: She has a normal mood and affect. Her behavior is normal.  Nursing note and vitals reviewed.  ED Course  Procedures (including critical care time) DIAGNOSTIC STUDIES: Oxygen Saturation is 100% on RA, normal by my interpretation.    COORDINATION OF CARE: 12:40 PM-Discussed treatment plan which includes Norco and wrist splint with pt at bedside and pt agreed to plan. Pt was offered a XR but she declined.    MDM   Final diagnoses:  Right hand pain    Right hand pain, no recent injuries, possible carpal tunnel, feels better and wrist cock-up  splint position. Will recommend hand follow-up.  I personally performed the services described in this documentation, which was scribed in my presence. The recorded information has been reviewed and is accurate.     Montine Circle, PA-C 01/25/15 1633  Courteney Julio Alm, MD 01/26/15 [id number]

## 2015-02-14 ENCOUNTER — Other Ambulatory Visit (HOSPITAL_COMMUNITY): Payer: Self-pay | Admitting: Psychiatry

## 2015-02-15 ENCOUNTER — Other Ambulatory Visit (HOSPITAL_COMMUNITY): Payer: Self-pay | Admitting: Psychiatry

## 2015-02-15 IMAGING — CR DG WRIST COMPLETE 3+V*R*
4 series · 4 of 4 positions shown · non-contrast
Comparison: No priors.

CLINICAL DATA: Pain in the right wrist.

EXAM:
RIGHT WRIST - COMPLETE 3+ VIEW

[x wrist pa right]
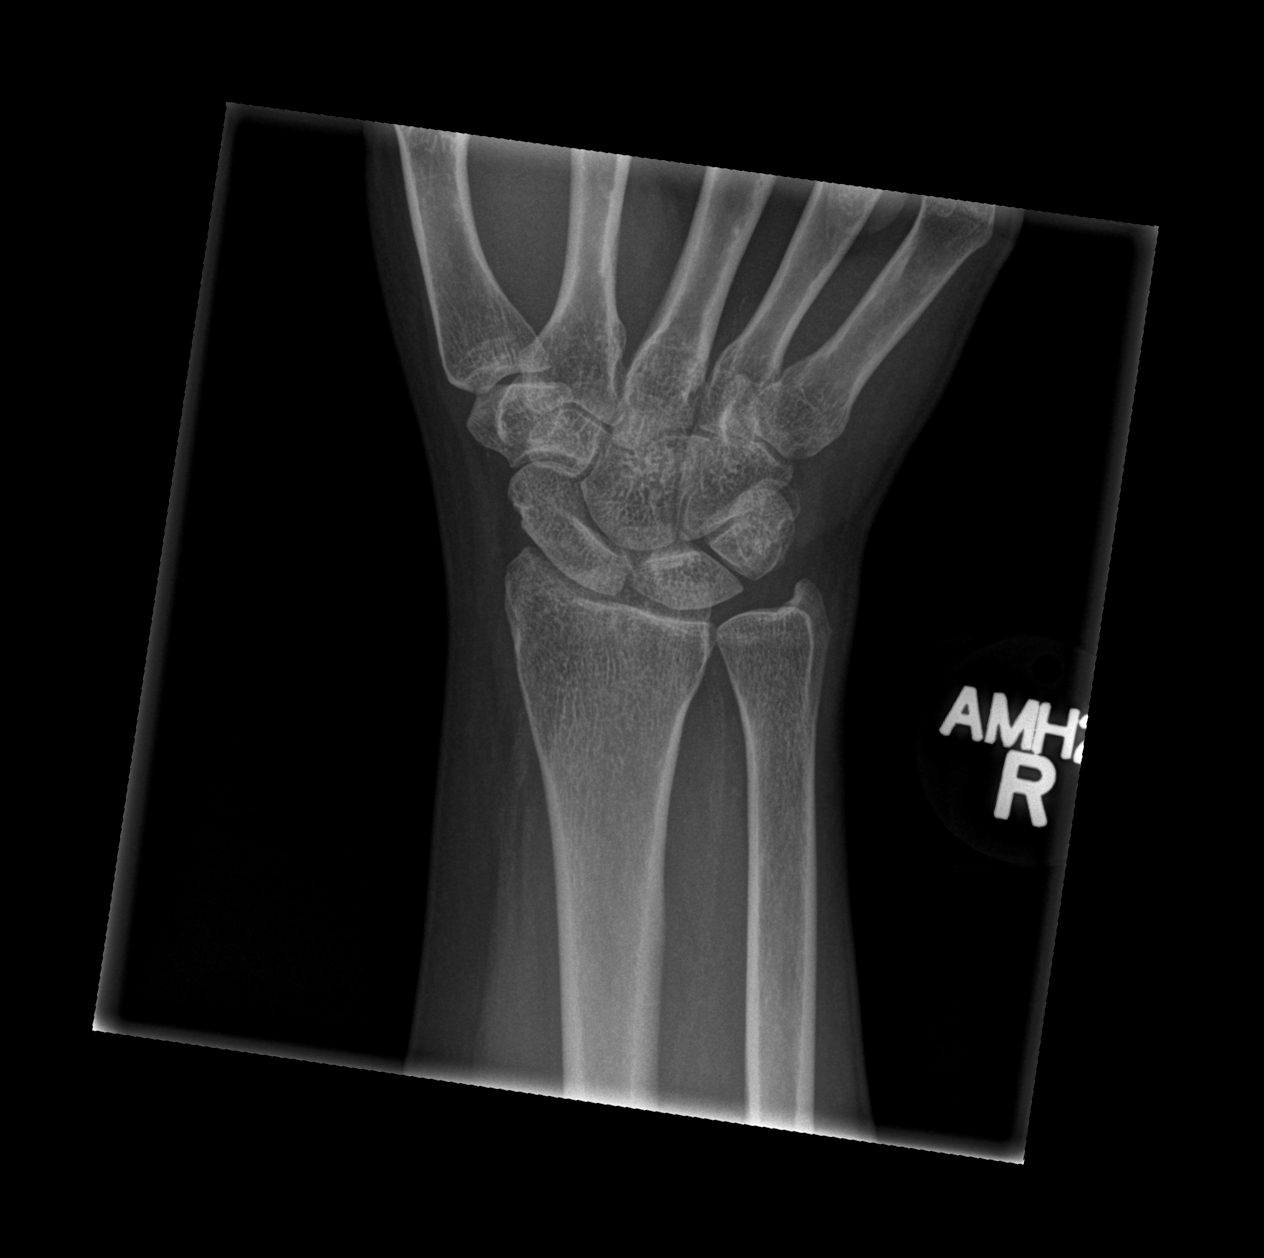

[x wrist obl right]
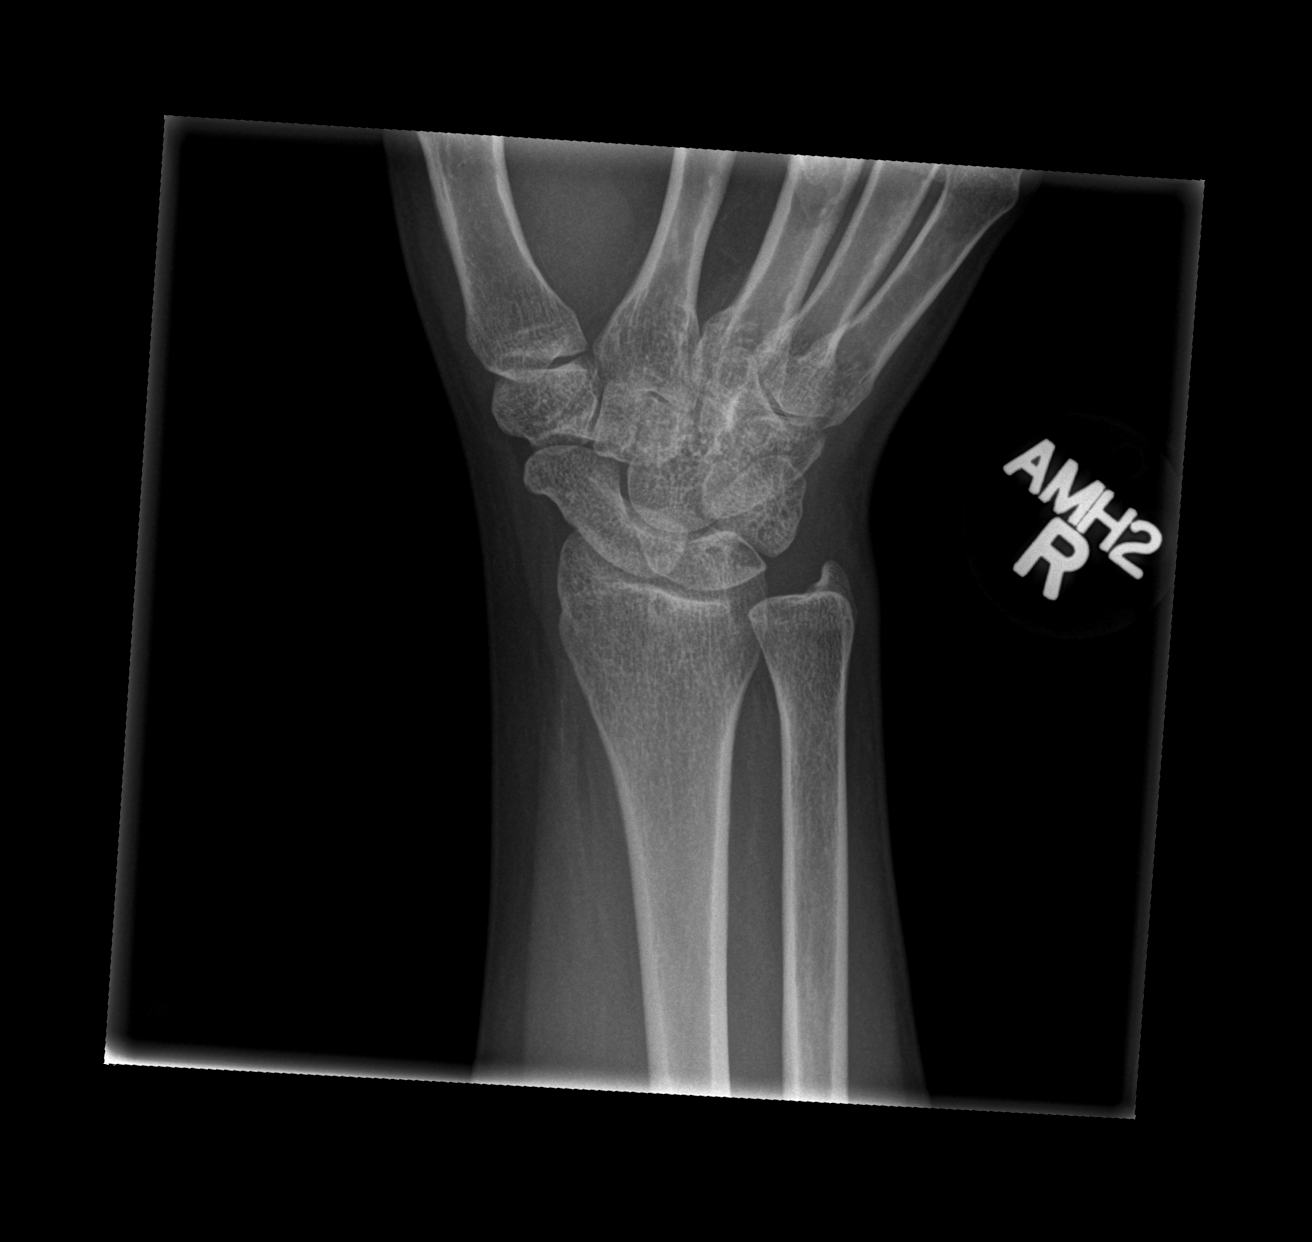

[x wrist lat right]
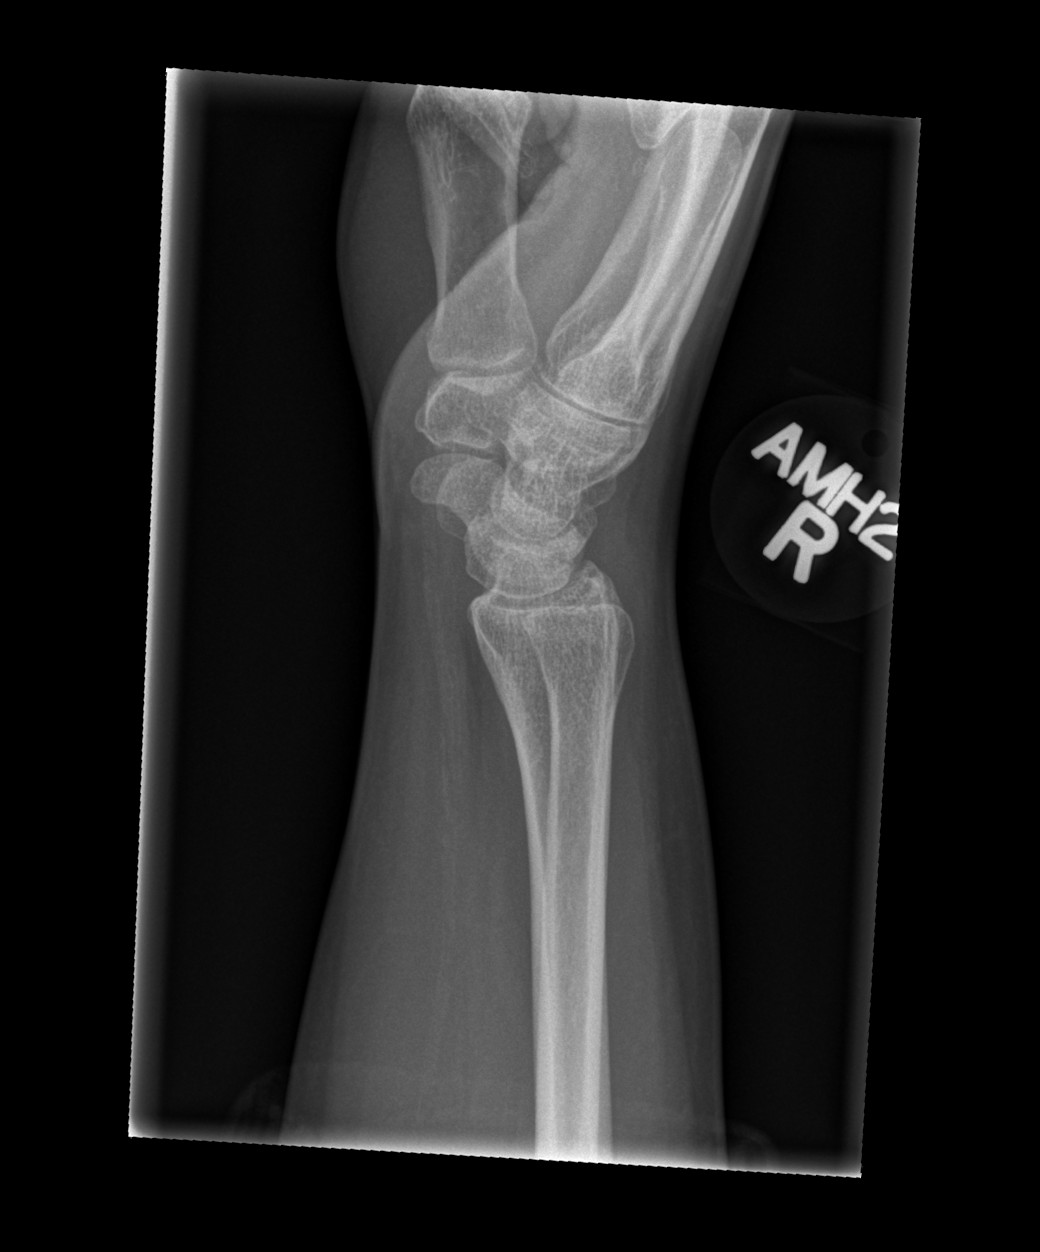

[x wrist navicular view right]
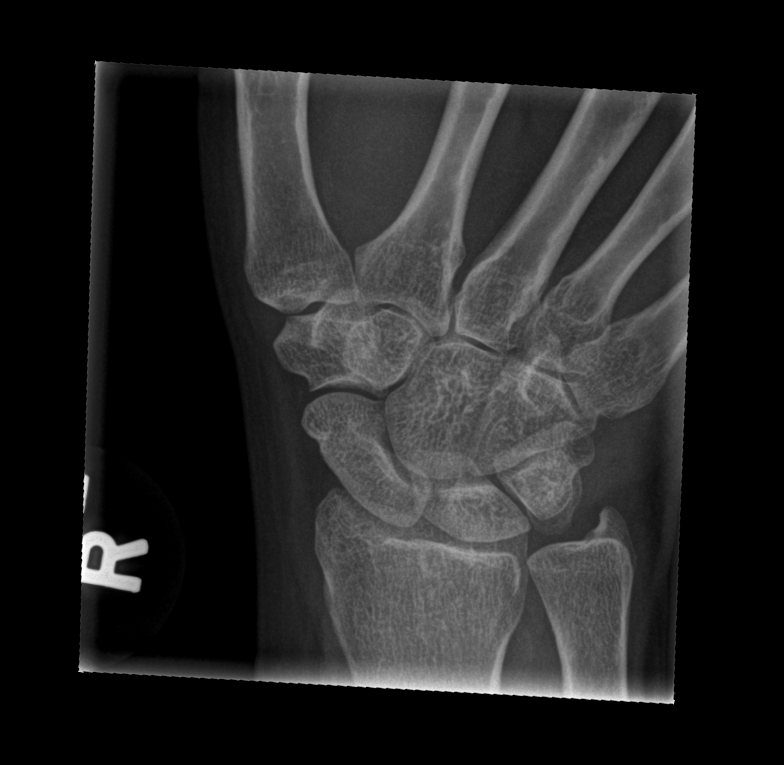

[4 of 4 positions shown; findings below may reference images not displayed]

FINDINGS: Multiple views of the right wrist demonstrate no acute displaced
fracture, subluxation, dislocation, or soft tissue abnormality.
IMPRESSION: No acute radiographic abnormality of the right wrist.

## 2015-02-16 ENCOUNTER — Other Ambulatory Visit (HOSPITAL_COMMUNITY): Payer: Self-pay | Admitting: Psychiatry

## 2015-02-19 ENCOUNTER — Emergency Department (HOSPITAL_COMMUNITY): Payer: BLUE CROSS/BLUE SHIELD

## 2015-02-19 ENCOUNTER — Inpatient Hospital Stay (HOSPITAL_COMMUNITY): Payer: BLUE CROSS/BLUE SHIELD

## 2015-02-19 ENCOUNTER — Encounter (HOSPITAL_COMMUNITY): Payer: Self-pay | Admitting: Cardiology

## 2015-02-19 ENCOUNTER — Inpatient Hospital Stay (HOSPITAL_COMMUNITY)
Admission: EM | Admit: 2015-02-19 | Discharge: 2015-02-21 | DRG: 638 | Disposition: A | Discharge status: Home / Self Care | Payer: BLUE CROSS/BLUE SHIELD | Attending: Internal Medicine | Admitting: Internal Medicine

## 2015-02-19 DIAGNOSIS — R110 Nausea: Secondary | ICD-10-CM | POA: Diagnosis not present

## 2015-02-19 DIAGNOSIS — R945 Abnormal results of liver function studies: Secondary | ICD-10-CM | POA: Diagnosis present

## 2015-02-19 DIAGNOSIS — E1010 Type 1 diabetes mellitus with ketoacidosis without coma: Principal | ICD-10-CM | POA: Diagnosis present

## 2015-02-19 DIAGNOSIS — E0810 Diabetes mellitus due to underlying condition with ketoacidosis without coma: Secondary | ICD-10-CM | POA: Diagnosis not present

## 2015-02-19 DIAGNOSIS — R7989 Other specified abnormal findings of blood chemistry: Secondary | ICD-10-CM | POA: Diagnosis not present

## 2015-02-19 DIAGNOSIS — E10319 Type 1 diabetes mellitus with unspecified diabetic retinopathy without macular edema: Secondary | ICD-10-CM | POA: Diagnosis not present

## 2015-02-19 DIAGNOSIS — R1012 Left upper quadrant pain: Secondary | ICD-10-CM

## 2015-02-19 DIAGNOSIS — N179 Acute kidney failure, unspecified: Secondary | ICD-10-CM | POA: Diagnosis not present

## 2015-02-19 DIAGNOSIS — R109 Unspecified abdominal pain: Secondary | ICD-10-CM | POA: Diagnosis not present

## 2015-02-19 DIAGNOSIS — Z794 Long term (current) use of insulin: Secondary | ICD-10-CM

## 2015-02-19 DIAGNOSIS — E109 Type 1 diabetes mellitus without complications: Secondary | ICD-10-CM | POA: Diagnosis present

## 2015-02-19 DIAGNOSIS — F329 Major depressive disorder, single episode, unspecified: Secondary | ICD-10-CM | POA: Diagnosis present

## 2015-02-19 DIAGNOSIS — M797 Fibromyalgia: Secondary | ICD-10-CM | POA: Diagnosis present

## 2015-02-19 DIAGNOSIS — E1110 Type 2 diabetes mellitus with ketoacidosis without coma: Secondary | ICD-10-CM | POA: Diagnosis present

## 2015-02-19 LAB — URINALYSIS, ROUTINE W REFLEX MICROSCOPIC
Bilirubin Urine: NEGATIVE
Glucose, UA: >1000 mg/dL — AB
Hgb urine dipstick: NEGATIVE
Ketones, ur: 15 mg/dL — AB
Leukocytes, UA: NEGATIVE
Nitrite: NEGATIVE
Protein, ur: NEGATIVE mg/dL
Specific Gravity, Urine: 1.03 (ref 1.005–1.030)
pH: 5 (ref 5.0–8.0)

## 2015-02-19 LAB — RAPID URINE DRUG SCREEN, HOSP PERFORMED
Amphetamines: NOT DETECTED
Barbiturates: NOT DETECTED
Benzodiazepines: NOT DETECTED
Cocaine: NOT DETECTED
Opiates: NOT DETECTED
Tetrahydrocannabinol: NOT DETECTED

## 2015-02-19 LAB — CBG MONITORING, ED
Glucose-Capillary: >600 mg/dL (ref 65–99)
Glucose-Capillary: >600 mg/dL (ref 65–99)
Glucose-Capillary: 508 mg/dL — ABNORMAL HIGH (ref 65–99)
Glucose-Capillary: 394 mg/dL — ABNORMAL HIGH (ref 65–99)
Glucose-Capillary: 284 mg/dL — ABNORMAL HIGH (ref 65–99)
Glucose-Capillary: 252 mg/dL — ABNORMAL HIGH (ref 65–99)

## 2015-02-19 LAB — COMPREHENSIVE METABOLIC PANEL
ALT: 23 U/L (ref 14–54)
AST: 26 U/L (ref 15–41)
Albumin: 4 g/dL (ref 3.5–5.0)
Alkaline Phosphatase: 172 U/L — ABNORMAL HIGH (ref 38–126)
Anion gap: 20 — ABNORMAL HIGH (ref 5–15)
BUN: 15 mg/dL (ref 6–20)
CO2: 19 mmol/L — ABNORMAL LOW (ref 22–32)
Calcium: 9.5 mg/dL (ref 8.9–10.3)
Chloride: 89 mmol/L — ABNORMAL LOW (ref 101–111)
Creatinine, Ser: 1.3 mg/dL — ABNORMAL HIGH (ref 0.44–1.00)
GFR calc Af Amer: 58 mL/min — ABNORMAL LOW (ref >60)
GFR calc non Af Amer: 50 mL/min — ABNORMAL LOW (ref >60)
Glucose, Bld: 736 mg/dL (ref 65–99)
Potassium: 5.3 mmol/L — ABNORMAL HIGH (ref 3.5–5.1)
Sodium: 128 mmol/L — ABNORMAL LOW (ref 135–145)
Total Bilirubin: 1.4 mg/dL — ABNORMAL HIGH (ref 0.3–1.2)
Total Protein: 7.9 g/dL (ref 6.5–8.1)

## 2015-02-19 LAB — I-STAT CHEM 8, ED
BUN: 18 mg/dL (ref 6–20)
Calcium, Ion: 1.14 mmol/L (ref 1.12–1.23)
Chloride: 94 mmol/L — ABNORMAL LOW (ref 101–111)
Creatinine, Ser: 0.9 mg/dL (ref 0.44–1.00)
Glucose, Bld: 598 mg/dL (ref 65–99)
HCT: 53 % — ABNORMAL HIGH (ref 36.0–46.0)
Hemoglobin: 18 g/dL — ABNORMAL HIGH (ref 12.0–15.0)
Potassium: 4.2 mmol/L (ref 3.5–5.1)
Sodium: 130 mmol/L — ABNORMAL LOW (ref 135–145)
TCO2: 23 mmol/L (ref 0–100)

## 2015-02-19 LAB — CBC
HCT: 45.5 % (ref 36.0–46.0)
Hemoglobin: 15.8 g/dL — ABNORMAL HIGH (ref 12.0–15.0)
MCH: 32.2 pg (ref 26.0–34.0)
MCHC: 34.7 g/dL (ref 30.0–36.0)
MCV: 92.9 fL (ref 78.0–100.0)
Platelets: 344 10*3/uL (ref 150–400)
RBC: 4.9 MIL/uL (ref 3.87–5.11)
RDW: 12.3 % (ref 11.5–15.5)
WBC: 14.8 10*3/uL — ABNORMAL HIGH (ref 4.0–10.5)

## 2015-02-19 LAB — BASIC METABOLIC PANEL
Anion gap: 13 (ref 5–15)
BUN: 15 mg/dL (ref 6–20)
CO2: 24 mmol/L (ref 22–32)
Calcium: 9.1 mg/dL (ref 8.9–10.3)
Chloride: 96 mmol/L — ABNORMAL LOW (ref 101–111)
Creatinine, Ser: 1.21 mg/dL — ABNORMAL HIGH (ref 0.44–1.00)
GFR calc Af Amer: >60 mL/min (ref >60)
GFR calc non Af Amer: 54 mL/min — ABNORMAL LOW (ref >60)
Glucose, Bld: 345 mg/dL — ABNORMAL HIGH (ref 65–99)
Potassium: 3.6 mmol/L (ref 3.5–5.1)
Sodium: 133 mmol/L — ABNORMAL LOW (ref 135–145)

## 2015-02-19 LAB — PREGNANCY, URINE: Preg Test, Ur: NEGATIVE

## 2015-02-19 LAB — I-STAT VENOUS BLOOD GAS, ED
Acid-base deficit: 2 mmol/L (ref 0.0–2.0)
Bicarbonate: 24.4 mEq/L — ABNORMAL HIGH (ref 20.0–24.0)
O2 Saturation: 26 %
TCO2: 26 mmol/L (ref 0–100)
pCO2, Ven: 46.2 mmHg (ref 45.0–50.0)
pH, Ven: 7.33 — ABNORMAL HIGH (ref 7.250–7.300)
pO2, Ven: 19 mmHg — CL (ref 30.0–45.0)

## 2015-02-19 LAB — URINE MICROSCOPIC-ADD ON

## 2015-02-19 LAB — LIPASE, BLOOD: Lipase: 20 U/L (ref 11–51)

## 2015-02-19 LAB — PROCALCITONIN: Procalcitonin: 1.85 ng/mL

## 2015-02-19 MED ORDER — ONDANSETRON 4 MG PO TBDP
ORAL_TABLET | ORAL | Status: AC
  Filled 2015-02-19: qty 1

## 2015-02-19 MED ORDER — ACETAMINOPHEN 500 MG PO TABS: 500.0000 mg | ORAL_TABLET | Freq: Four times a day (QID) | ORAL | Status: DC | PRN

## 2015-02-19 MED ORDER — ENOXAPARIN SODIUM 40 MG/0.4ML ~~LOC~~ SOLN
40.0000 mg | SUBCUTANEOUS | Status: DC
Start: 2015-02-20 — End: 2015-02-21
  Administered 2015-02-20 – 2015-02-21 (×2): 40 mg via SUBCUTANEOUS
  Filled 2015-02-19 (×2): qty 0.4

## 2015-02-19 MED ORDER — FLUOXETINE HCL 20 MG PO CAPS
40.0000 mg | ORAL_CAPSULE | Freq: Every day | ORAL | Status: DC
  Administered 2015-02-20 – 2015-02-21 (×2): 40 mg via ORAL
  Filled 2015-02-19 (×2): qty 2

## 2015-02-19 MED ORDER — HYDROMORPHONE HCL 1 MG/ML IJ SOLN
1.0000 mg | Freq: Once | INTRAMUSCULAR | Status: AC
  Administered 2015-02-19: 1 mg via INTRAVENOUS
  Filled 2015-02-19: qty 1

## 2015-02-19 MED ORDER — SODIUM CHLORIDE 0.9 % IV SOLN
INTRAVENOUS | Status: DC
  Administered 2015-02-20: via INTRAVENOUS

## 2015-02-19 MED ORDER — SODIUM CHLORIDE 0.9 % IV SOLN: INTRAVENOUS | Status: AC

## 2015-02-19 MED ORDER — OXYCODONE HCL 5 MG PO TABS
10.0000 mg | ORAL_TABLET | Freq: Three times a day (TID) | ORAL | Status: DC | PRN
  Administered 2015-02-20 – 2015-02-21 (×4): 10 mg via ORAL
  Filled 2015-02-19 (×4): qty 2

## 2015-02-19 MED ORDER — SODIUM CHLORIDE 0.9 % IV SOLN
INTRAVENOUS | Status: DC
  Administered 2015-02-19: 5.4 [IU]/h via INTRAVENOUS
  Filled 2015-02-19: qty 2.5

## 2015-02-19 MED ORDER — SODIUM CHLORIDE 0.9 % IV BOLUS (SEPSIS)
1000.0000 mL | Freq: Once | INTRAVENOUS | Status: AC
  Administered 2015-02-19: 1000 mL via INTRAVENOUS

## 2015-02-19 MED ORDER — SODIUM CHLORIDE 0.9 % IV SOLN
INTRAVENOUS | Status: DC
  Administered 2015-02-19: 3.3 [IU]/h via INTRAVENOUS
  Filled 2015-02-19: qty 2.5

## 2015-02-19 MED ORDER — ONDANSETRON 4 MG PO TBDP
4.0000 mg | ORAL_TABLET | Freq: Once | ORAL | Status: AC
  Administered 2015-02-19: 4 mg via ORAL

## 2015-02-19 MED ORDER — HYDROMORPHONE HCL 1 MG/ML IJ SOLN
1.0000 mg | INTRAMUSCULAR | Status: DC | PRN
  Administered 2015-02-20 (×2): 1 mg via INTRAVENOUS
  Filled 2015-02-19 (×2): qty 1

## 2015-02-19 MED ORDER — CYCLOBENZAPRINE HCL 5 MG PO TABS
5.0000 mg | ORAL_TABLET | Freq: Three times a day (TID) | ORAL | Status: DC | PRN
Start: 2015-02-19 — End: 2015-02-21

## 2015-02-19 MED ORDER — DEXTROSE-NACL 5-0.45 % IV SOLN
INTRAVENOUS | Status: DC
  Administered 2015-02-20: 01:00:00 via INTRAVENOUS

## 2015-02-19 MED ORDER — IOHEXOL 300 MG/ML  SOLN
100.0000 mL | Freq: Once | INTRAMUSCULAR | Status: AC | PRN
  Administered 2015-02-19: 100 mL via INTRAVENOUS

## 2015-02-19 MED ORDER — ONDANSETRON HCL 4 MG/2ML IJ SOLN: 4.0000 mg | Freq: Four times a day (QID) | INTRAMUSCULAR | Status: DC | PRN

## 2015-02-19 MED ORDER — IOHEXOL 300 MG/ML  SOLN: 80.0000 mL | Freq: Once | INTRAMUSCULAR | Status: DC | PRN

## 2015-02-19 NOTE — ED Provider Notes (Signed)
Complains of of left flank pain gradual onset 2 days ago. Symptoms are covered by nausea. No vomiting. No abdominal pain. Assess his symptoms include generalized weakness. Patient should felt to be in DKA with low bicarbonate, anion gap of 20. Elevated glucose. Glucose stabilizer and DKA order set ordered.  Orlie Dakin, MD 02/20/15 [id number]

## 2015-02-19 NOTE — Progress Notes (Signed)
Received report from [place], RN in ED for transfer of pt to [phone].

## 2015-02-19 NOTE — ED Provider Notes (Signed)
CSN: [id number]     Arrival date & time 02/19/15  1443 History   First MD Initiated Contact with Patient 02/19/15 1643     Chief Complaint  Patient presents with  . Flank Pain     (Consider location/radiation/quality/duration/timing/severity/associated sxs/prior Treatment) Patient is a 45 y.o. female presenting with abdominal pain. The history is provided by the patient.  Abdominal Pain Pain location:  L flank and LUQ Pain quality: throbbing   Pain radiates to:  Does not radiate Pain severity:  Moderate Onset quality:  Gradual Duration:  1 day Timing:  Constant Progression:  Worsening Chronicity:  New Context: not diet changes, not sick contacts and not trauma   Relieved by:  Nothing Worsened by:  Nothing tried Ineffective treatments:  None tried Associated symptoms: nausea   Associated symptoms: no cough, no diarrhea, no fever and no shortness of breath     Past Medical History  Diagnosis Date  . 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 Fibromyalgia   . Hyperlipidemia   . Abdominal pain   . Nausea & vomiting   . Biliary dyskinesia   . Depression   . Joint pain   . Skin rash     due to medications  . Post traumatic stress disorder (PTSD)   . Sleep trouble   . Major depressive disorder ([place])   . Cervical strain   . Closed head injury 2010  . Renal stones   . Migraine     hx of none recent  . Arthritis   . Hypoglycemia   . Anxiety   . Pulmonary embolism [facility])    Past Surgical History  Procedure Laterality Date  . Lipoma removed  yrs ago  . Knee surgery Left 2012  . Cesarean section  1997, 1999  . Cholecystectomy  02/10/2011    Procedure: LAPAROSCOPIC CHOLECYSTECTOMY WITH INTRAOPERATIVE CHOLANGIOGRAM;  Surgeon: Judieth Keens, DO;  Location: [facility];  Service: General;  Laterality: N/A;  . Robotic assisted laparoscopic lysis of adhesion N/A 03/04/2012    Procedure: ROBOTIC ASSISTED LAPAROSCOPIC LYSIS OF EXTENSIVE  ADHESIONS;  Surgeon: Claiborne Billings A. Pamala Hurry, MD;  Location: [facility];  Service: Gynecology;  Laterality: N/A;  . Tonsillectomy  2001  . Abdominal hysterectomy  2001  . Eye surgery  2006    laser eye surgery left eye  . Exploratory laparomty  Mar 04, 2012  . Laparoscopic appendectomy N/A 10/12/2012    Procedure: DIAGNOSTIC APPENDECTOMY LAPAROSCOPIC;  Surgeon: Madilyn Hook, DO;  Location: [facility];  Service: General;  Laterality: N/A;  . Appendectomy     Family History  Problem Relation Age of Onset  . Cancer Father     lymphoma  . Nephrolithiasis Father   . Vascular Disease Father   . Alcohol abuse Father   . Drug abuse Father   . Cancer Maternal Grandmother     colon  . Hypertension Mother   . Heart disease Mother   . Cataracts Mother   . Depression Mother   . Diabetes Brother   . Drug abuse Brother   . Mental illness Maternal Grandfather   . Cancer Maternal Grandfather   . Heart disease Paternal Grandmother   . Heart disease Paternal Grandfather   . Suicidality Maternal Uncle    Social History  Substance Use Topics  . Smoking status: Never Smoker   . Smokeless tobacco: Never Used  . Alcohol Use: No   OB History    No data available  Review of Systems  Constitutional: Negative for fever.  HENT: Negative.   Eyes: Negative for visual disturbance.  Respiratory: Negative for cough and shortness of breath.   Gastrointestinal: Positive for nausea and abdominal pain. Negative for diarrhea.  Genitourinary: Negative.   Musculoskeletal: Negative.   Skin: Negative.   Neurological: Negative.   Psychiatric/Behavioral: Negative.       Allergies  Cephalexin; Ibuprofen; and Meloxicam  Home Medications   Prior to Admission medications   Medication Sig Start Date End Date Taking? Authorizing Provider  acetaminophen (TYLENOL) 500 MG tablet Take 500 mg by mouth every 6 (six) hours as needed for mild pain. Reported on 01/23/2015   Yes Historical Provider, MD  atorvastatin (LIPITOR)  40 MG tablet Take 1 tablet (40 mg total) by mouth daily. Patient taking differently: Take 40 mg by mouth daily at 6 PM.  12/05/14  Yes Gay Filler Copland, MD  cyclobenzaprine (FLEXERIL) 5 MG tablet Take 5 mg by mouth 3 (three) times daily as needed for muscle spasms.  01/19/15  Yes Historical Provider, MD  FLUoxetine (PROZAC) 40 MG capsule Take 40 mg by mouth daily.   Yes Historical Provider, MD  insulin aspart (NOVOLOG) 100 UNIT/ML injection Inject 8 Units into the skin 3 (three) times daily with meals. 10/16/13  Yes Kerrie Buffalo, NP  insulin glargine (LANTUS) 100 UNIT/ML injection Inject 26 Units into the skin at bedtime.    Yes Historical Provider, MD  Oxycodone HCl 10 MG TABS Take 10 mg by mouth every 8 (eight) hours as needed (pain).   Yes Historical Provider, MD  cyproheptadine (PERIACTIN) 4 MG tablet Take 1/2-1 pill up to 3 times daily as needed for itching Patient not taking: Reported on 02/19/2015 01/17/15   Posey Boyer, MD  HYDROcodone-acetaminophen (NORCO/VICODIN) 5-325 MG tablet Take 1 tablet by mouth every 6 (six) hours as needed. Patient not taking: Reported on 02/19/2015 01/25/15   Montine Circle, PA-C  zolpidem (AMBIEN) 5 MG tablet Take 1 tablet (5 mg total) by mouth at bedtime as needed for sleep. Patient not taking: Reported on 02/19/2015 01/23/15   Thao P Le, DO   BP 102/52 mmHg  Pulse 75  Temp(Src) 98.3 F (36.8 C) (Oral)  Resp 16  Ht 5\' 4"  (1.626 m)  Wt 77.157 kg  BMI 29.18 kg/m2  SpO2 98% Physical Exam  Constitutional: She is oriented to person, place, and time. She appears well-developed and well-nourished. No distress.  HENT:  Head: Normocephalic and atraumatic.  Eyes: Pupils are equal, round, and reactive to light. No scleral icterus.  Neck: Normal range of motion.  Cardiovascular: Normal rate, regular rhythm, normal heart sounds and intact distal pulses.   Pulmonary/Chest: Effort normal and breath sounds normal. No respiratory distress. She has no wheezes. She has  no rales.  Abdominal: Soft. She exhibits no distension. There is tenderness (along luq and left flank). There is no rebound and no guarding.  Musculoskeletal: Normal range of motion. She exhibits no edema or tenderness.  Neurological: She is alert and oriented to person, place, and time. No cranial nerve deficit. She exhibits normal muscle tone. Coordination normal.  Skin: Skin is warm and dry. No rash noted. She is not diaphoretic. No erythema. No pallor.  Psychiatric: She has a normal mood and affect.  Nursing note and vitals reviewed.   ED Course  Procedures (including critical care time) Labs Review Labs Reviewed  COMPREHENSIVE METABOLIC PANEL - Abnormal; Notable for the following:    Sodium 128 (*)  Potassium 5.3 (*)    Chloride 89 (*)    CO2 19 (*)    Glucose, Bld 736 (*)    Creatinine, Ser 1.30 (*)    Alkaline Phosphatase 172 (*)    Total Bilirubin 1.4 (*)    GFR calc non Af Amer 50 (*)    GFR calc Af Amer 58 (*)    Anion gap 20 (*)    All other components within normal limits  CBC - Abnormal; Notable for the following:    WBC 14.8 (*)    Hemoglobin 15.8 (*)    All other components within normal limits  URINALYSIS, ROUTINE W REFLEX MICROSCOPIC (NOT AT [facility]) - Abnormal; Notable for the following:    Glucose, UA >1000 (*)    Ketones, ur 15 (*)    All other components within normal limits  URINE MICROSCOPIC-ADD ON - Abnormal; Notable for the following:    Squamous Epithelial / LPF 0-5 (*)    Bacteria, UA RARE (*)    All other components within normal limits  BASIC METABOLIC PANEL - Abnormal; Notable for the following:    Sodium 133 (*)    Chloride 96 (*)    Glucose, Bld 345 (*)    Creatinine, Ser 1.21 (*)    GFR calc non Af Amer 54 (*)    All other components within normal limits  BASIC METABOLIC PANEL - Abnormal; Notable for the following:    Chloride 98 (*)    Glucose, Bld 222 (*)    Creatinine, Ser 1.14 (*)    GFR calc non Af Amer 58 (*)    All other  components within normal limits  BASIC METABOLIC PANEL - Abnormal; Notable for the following:    Chloride 100 (*)    Glucose, Bld 223 (*)    Creatinine, Ser 1.22 (*)    Calcium 8.7 (*)    GFR calc non Af Amer 54 (*)    All other components within normal limits  GLUCOSE, CAPILLARY - Abnormal; Notable for the following:    Glucose-Capillary 203 (*)    All other components within normal limits  GLUCOSE, CAPILLARY - Abnormal; Notable for the following:    Glucose-Capillary 136 (*)    All other components within normal limits  GLUCOSE, CAPILLARY - Abnormal; Notable for the following:    Glucose-Capillary 116 (*)    All other components within normal limits  GLUCOSE, CAPILLARY - Abnormal; Notable for the following:    Glucose-Capillary 132 (*)    All other components within normal limits  GLUCOSE, CAPILLARY - Abnormal; Notable for the following:    Glucose-Capillary 169 (*)    All other components within normal limits  GLUCOSE, CAPILLARY - Abnormal; Notable for the following:    Glucose-Capillary 171 (*)    All other components within normal limits  GLUCOSE, CAPILLARY - Abnormal; Notable for the following:    Glucose-Capillary 188 (*)    All other components within normal limits  GLUCOSE, CAPILLARY - Abnormal; Notable for the following:    Glucose-Capillary 250 (*)    All other components within normal limits  CBG MONITORING, ED - Abnormal; Notable for the following:    Glucose-Capillary >600 (*)    All other components within normal limits  CBG MONITORING, ED - Abnormal; Notable for the following:    Glucose-Capillary >600 (*)    All other components within normal limits  I-STAT VENOUS BLOOD GAS, ED - Abnormal; Notable for the following:    pH, Ven 7.330 (*)  pO2, Ven 19.0 (*)    Bicarbonate 24.4 (*)    All other components within normal limits  I-STAT CHEM 8, ED - Abnormal; Notable for the following:    Sodium 130 (*)    Chloride 94 (*)    Glucose, Bld 598 (*)     Hemoglobin 18.0 (*)    HCT 53.0 (*)    All other components within normal limits  CBG MONITORING, ED - Abnormal; Notable for the following:    Glucose-Capillary 508 (*)    All other components within normal limits  CBG MONITORING, ED - Abnormal; Notable for the following:    Glucose-Capillary 394 (*)    All other components within normal limits  CBG MONITORING, ED - Abnormal; Notable for the following:    Glucose-Capillary 284 (*)    All other components within normal limits  CBG MONITORING, ED - Abnormal; Notable for the following:    Glucose-Capillary 252 (*)    All other components within normal limits  CULTURE, BLOOD (ROUTINE X 2)  CULTURE, BLOOD (ROUTINE X 2)  URINE CULTURE  LIPASE, BLOOD  PREGNANCY, URINE  PROCALCITONIN  URINE RAPID DRUG SCREEN, HOSP PERFORMED  BASIC METABOLIC PANEL  HEMOGLOBIN A1C    Imaging Review Ct Abdomen Pelvis W Contrast  02/19/2015  CLINICAL DATA:  Left flank pain, onset this morning.  Nausea. EXAM: CT ABDOMEN AND PELVIS WITH CONTRAST TECHNIQUE: Multidetector CT imaging of the abdomen and pelvis was performed using the standard protocol following bolus administration of intravenous contrast. CONTRAST:  160mL OMNIPAQUE IOHEXOL 300 MG/ML  SOLN COMPARISON:  Renal ultrasound earlier this day. Most recent CT 09/03/2014 FINDINGS: Lower chest:  The included lung bases are clear. Liver: No focal lesion. Hepatobiliary: Clips in the gallbladder fossa postcholecystectomy. No biliary dilatation. Pancreas: No ductal dilatation or inflammation. Spleen: Normal. Adrenal glands: No nodule. Kidneys: Symmetric renal enhancement. No hydronephrosis. 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 Small 2 mm calcification, nonobstructing left renal stone versus parenchymal calcification unchanged. Stomach/Bowel: Stomach physiologically distended. There are prominent fluid-filled small bowel loops in the left upper quadrant of the abdomen, measuring up to 2.9 cm. No  definite associated bowel wall thickening, questionable mild edema in the adjacent mesentery. Remaining small bowel loops are decompressed. Small volume of stool throughout the colon without colonic wall thickening. Surgical clip at the base of the cecum post appendectomy. Vascular/Lymphatic: No retroperitoneal adenopathy. Abdominal aorta is normal in caliber. Accessory lower pole left renal arteries. Mild atherosclerosis. Reproductive: Uterus is surgically absent.  No adnexal mass. Bladder: Physiologically distended, no wall thickening. Other: No free air, free fluid, or intra-abdominal fluid collection. Musculoskeletal: There are no acute or suspicious osseous abnormalities. Transitional lumbosacral anatomy. IMPRESSION: 1. A few prominent fluid-filled small bowel loops in the left upper quadrant of the abdomen, may reflect enteritis or focal ileus. No evidence of obstruction. 2. Punctate nonobstructing left renal calculus versus parenchymal calcification. Electronically Signed   By: Jeb Levering M.D.   On: 02/19/2015 21:53   US Renal  02/19/2015  CLINICAL DATA:  Left flank pain beginning this morning. History of renal stones. EXAM: RENAL / URINARY TRACT ULTRASOUND COMPLETE COMPARISON:  CT, 09/03/2014 FINDINGS: Right Kidney: Length: 11.8 cm. There is a vague round area of relative increased echogenicity in the central aspect of the right kidney suggesting a solid mass, measuring approximately 3.3 cm in greatest dimension. This is most likely a prominent area of normal parenchyma given that there was no mass on the prior CT. The kidneys  are under rotated based on the prior CT. There is no stone no hydronephrosis. Left Kidney: Length: 10.4 cm. Echogenicity within normal limits. No mass or hydronephrosis visualized. Bladder: Appears normal for degree of bladder distention. IMPRESSION: 1. No renal stones. No hydronephrosis. No findings to explain left flank pain. 2. Vague rounded area of relative increased  echogenicity in the right kidney centrally. This is most likely normal parenchyma. Consider follow-up nonemergent abdomen CT with contrast versus short-term follow-up renal ultrasound with repeat study in 3-4 months. Electronically Signed   By: Lajean Manes M.D.   On: 02/19/2015 19:53   I have personally reviewed and evaluated these images and lab results as part of my medical decision-making.   EKG Interpretation None      MDM   Final diagnoses:  Diabetic ketoacidosis without coma associated with type 1 diabetes mellitus (H[facility])  Left upper quadrant pain  Nausea    Patient is a 45 year old female with a history of diabetes who presents with left-sided flank pain, fatigue, nausea. She states that this feels like a kidney stone. She does have a history of DKA in the past. Initial labs with a mild leukocytosis and hyperglycemia with a anion gap of 20, with a bicarbonate of 19. Given concern for DKA patient started on IV fluids and insulin infusion. Renal ultrasound obtained without evidence of kidney stone. Will obtain CT scan to evaluation for cont left flank pain, which showed possible enteritis. No evidence of pyelo or stone. Patient to be admitted to hospitialist for further management and evaluation.    Heriberto Antigua, MD 02/20/15 [place], MD 02/22/15 [phone]

## 2015-02-19 NOTE — ED Notes (Signed)
Patient transported to CT 

## 2015-02-19 NOTE — ED Notes (Signed)
Critical lab: glucose 736.

## 2015-02-19 NOTE — ED Notes (Signed)
Pt c/o nausea and requesting nausea medication - zofran 4mg  ODT administered at nurse first.

## 2015-02-19 NOTE — ED Notes (Signed)
Admitting MD at bedside.

## 2015-02-19 NOTE — ED Notes (Signed)
CBG 508. RN notified.

## 2015-02-19 NOTE — H&P (Signed)
[facility] History and Physical  KRINA MRAZ QMV:[id number] DOB: 06-18-1970 DOA: 02/19/2015  Referring physician: ED physician PCP: [name], MD  Specialists:  Dr. Doyne Keel, psychiatry   Chief Complaint:  Left flank pain   HPI: Maureen [name] is a 45 y.o. female with PMH of type 1 diabetes mellitus, major depressive disorder, fibromyalgia, and history of renal calculi who presents to the ED with 1 day of left flank pain with nausea. Patient reports that yesterday she was in her usual state of health, and felt well upon waking this morning. Late in the morning, she developed severe, achy, constant left flank pain without radiation. She is unable to identify any alleviating or exacerbating factors. She denies dysuria or hematuria, and while she endorses lower abdominal pain, she states that this is chronic. She has had similar symptoms previously when she had a kidney stone. She reports a subjective fever approximately 2 days ago, but no cough, dyspnea, vomiting, or diarrhea. She denies any recent alcohol or illicit drug use. She acknowledges missing her lunchtime dose of NovoLog today, but states that she's otherwise been compliant with her regimen. Patient had an episode of DKA approximately one year ago, precipitated by pulmonary embolism. There is no cough, pelvic notations, chest pain, hemoptysis, or leg pain or swelling.  In ED, patient was found to be afebrile, saturating well on room air, and with vital signs stable. Initial blood work was notable for a serum glucose of 736 with potassium of 5.3 and serum creatinine of 1.3, up from apparent baseline of 0.9. There were other electrolyte derangements as well; bicarbonate is 19 and anion gap is 20. Patient is noted to have a leukocytosis to 14,800, with hemoglobin elevated to a value of 15.8. Ketones are noted in the urine. A renal ultrasound was obtained in the emergency department and negative for stone or hydronephrosis.  Vague area was noted in the central right kidney which is likely normal parenchyma, but is said to warrant nonemergent follow-up. Patient was bolused 1 L normal saline in the emergency department and started on stabilizer infusion. She is awaiting CT of the abdomen with contrast at this time and will be admitted for ongoing evaluation and management of DKA and left flank pain.  Where does patient live?   At home     Can patient participate in ADLs?  Yes    Review of Systems:   General: No chills, sweats, weight change, poor appetite, or fatigue. Subjective fever 2 days ago.  HEENT: no blurry vision, hearing changes or sore throat Pulm: no dyspnea, cough, or wheeze CV: no chest pain or palpitations Abd: no vomiting, diarrhea, or constipation. Nausea, chronic lower abdominal pain.  GU: no dysuria or hematuria. Polyuria, onset today.  Ext: no leg edema Neuro: no focal weakness, numbness, or tingling, no vision change or hearing loss Skin: no rash, no wounds MSK: No muscle spasm, no deformity, no red, hot, or swollen joint Heme: No easy bruising or bleeding Travel history: No recent long distant travel    Allergy:  Allergies  Allergen Reactions  . Cephalexin Rash    Pt was admitted to the hospital upon taking.  . Ibuprofen Nausea And Vomiting and Rash  . Meloxicam Nausea Only    Past Medical History  Diagnosis Date  . 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 Fibromyalgia   . Hyperlipidemia   . Abdominal pain   . Nausea & vomiting   . Biliary dyskinesia   .  Depression   . Joint pain   . Skin rash     due to medications  . Post traumatic stress disorder (PTSD)   . Sleep trouble   . Major depressive disorder ([place])   . Cervical strain   . Closed head injury 2010  . Renal stones   . Migraine     hx of none recent  . Arthritis   . Hypoglycemia   . Anxiety   . Pulmonary embolism [facility])     Past Surgical History  Procedure Laterality  Date  . Lipoma removed  yrs ago  . Knee surgery Left 2012  . Cesarean section  1997, 1999  . Cholecystectomy  02/10/2011    Procedure: LAPAROSCOPIC CHOLECYSTECTOMY WITH INTRAOPERATIVE CHOLANGIOGRAM;  Surgeon: Judieth Keens, DO;  Location: [facility];  Service: General;  Laterality: N/A;  . Robotic assisted laparoscopic lysis of adhesion N/A 03/04/2012    Procedure: ROBOTIC ASSISTED LAPAROSCOPIC LYSIS OF EXTENSIVE ADHESIONS;  Surgeon: Claiborne Billings A. Pamala Hurry, MD;  Location: [facility];  Service: Gynecology;  Laterality: N/A;  . Tonsillectomy  2001  . Abdominal hysterectomy  2001  . Eye surgery  2006    laser eye surgery left eye  . Exploratory laparomty  Mar 04, 2012  . Laparoscopic appendectomy N/A 10/12/2012    Procedure: DIAGNOSTIC APPENDECTOMY LAPAROSCOPIC;  Surgeon: Madilyn Hook, DO;  Location: [facility];  Service: General;  Laterality: N/A;  . Appendectomy      Social History:  reports that she has never smoked. She has never used smokeless tobacco. She reports that she does not drink alcohol or use illicit drugs.  Family History:  Family History  Problem Relation Age of Onset  . Cancer Father     lymphoma  . Nephrolithiasis Father   . Vascular Disease Father   . Alcohol abuse Father   . Drug abuse Father   . Cancer Maternal Grandmother     colon  . Hypertension Mother   . Heart disease Mother   . Cataracts Mother   . Depression Mother   . Diabetes Brother   . Drug abuse Brother   . Mental illness Maternal Grandfather   . Cancer Maternal Grandfather   . Heart disease Paternal Grandmother   . Heart disease Paternal Grandfather   . Suicidality Maternal Uncle      Prior to Admission medications   Medication Sig Start Date End Date Taking? Authorizing Provider  acetaminophen (TYLENOL) 500 MG tablet Take 500 mg by mouth every 6 (six) hours as needed for mild pain. Reported on 01/23/2015   Yes Historical Provider, MD  atorvastatin (LIPITOR) 40 MG tablet Take 1 tablet (40 mg total) by  mouth daily. Patient taking differently: Take 40 mg by mouth daily at 6 PM.  12/05/14  Yes Gay Filler Copland, MD  cyclobenzaprine (FLEXERIL) 5 MG tablet Take 5 mg by mouth 3 (three) times daily as needed for muscle spasms.  01/19/15  Yes Historical Provider, MD  FLUoxetine (PROZAC) 40 MG capsule Take 40 mg by mouth daily.   Yes Historical Provider, MD  insulin aspart (NOVOLOG) 100 UNIT/ML injection Inject 8 Units into the skin 3 (three) times daily with meals. 10/16/13  Yes Kerrie Buffalo, NP  insulin glargine (LANTUS) 100 UNIT/ML injection Inject 26 Units into the skin at bedtime.    Yes Historical Provider, MD  Oxycodone HCl 10 MG TABS Take 10 mg by mouth every 8 (eight) hours as needed (pain).   Yes Historical Provider, MD  cyproheptadine (PERIACTIN) 4  MG tablet Take 1/2-1 pill up to 3 times daily as needed for itching Patient not taking: Reported on 02/19/2015 01/17/15   Posey Boyer, MD  HYDROcodone-acetaminophen (NORCO/VICODIN) 5-325 MG tablet Take 1 tablet by mouth every 6 (six) hours as needed. Patient not taking: Reported on 02/19/2015 01/25/15   Montine Circle, PA-C  zolpidem (AMBIEN) 5 MG tablet Take 1 tablet (5 mg total) by mouth at bedtime as needed for sleep. Patient not taking: Reported on 02/19/2015 01/23/15   Glenford Bayley, DO    Physical Exam: Filed Vitals:   02/19/15 1904 02/19/15 1954 02/19/15 2000 02/19/15 2015  BP:  130/74 131/72 125/99  Pulse: 92 95 94 94  Temp:      TempSrc:      Resp: '17 17 20 15  '$ Weight:      SpO2: 100% 100% 100% 100%   General: Not in acute distress HEENT:       Eyes: PERRL, EOMI, no scleral icterus or conjunctival pallor.       ENT: No discharge from the ears or nose, no pharyngeal ulcers, oral mucosa dry and tacky.        Neck: No JVD, no bruit, no appreciable mass Heme: No cervical adenopathy, no pallor Cardiac: S1/S2, RRR, No murmurs, No gallops or rubs. Pulm: Good air movement bilaterally. No rales, wheezing, rhonchi or rubs. Abd: Soft,  nondistended, mild tenderness in lower quadrants, no rebound pain or gaurding, no mass or organomegaly, BS present. Ext: No LE edema bilaterally. 2+DP/PT pulse bilaterally. Musculoskeletal: No gross deformity, no red, hot, swollen joints, no limitation in ROM  Skin: No rashes or wounds on exposed surfaces  Neuro: Alert, oriented X3, cranial nerves II-XII grossly intact. No focal findings Psych: Patient is not overtly psychotic, denies suicidal or homocidal ideation, no active hallucinations.  Labs on Admission:  Basic Metabolic Panel:  Recent Labs Lab 02/19/15 1540 02/19/15 1909  NA 128* 130*  K 5.3* 4.2  CL 89* 94*  CO2 19*  --   GLUCOSE 736* 598*  BUN 15 18  CREATININE 1.30* 0.90  CALCIUM 9.5  --    Liver Function Tests:  Recent Labs Lab 02/19/15 1540  AST 26  ALT 23  ALKPHOS 172*  BILITOT 1.4*  PROT 7.9  ALBUMIN 4.0    Recent Labs Lab 02/19/15 1540  LIPASE 20   No results for input(s): AMMONIA in the last 168 hours. CBC:  Recent Labs Lab 02/19/15 1540 02/19/15 1909  WBC 14.8*  --   HGB 15.8* 18.0*  HCT 45.5 53.0*  MCV 92.9  --   PLT 344  --    Cardiac Enzymes: No results for input(s): CKTOTAL, CKMB, CKMBINDEX, TROPONINI in the last 168 hours.  BNP (last 3 results)  Recent Labs  03/26/14 1531 09/19/14 2057  BNP 27.9 9.1    ProBNP (last 3 results) No results for input(s): PROBNP in the last 8760 hours.  CBG:  Recent Labs Lab 02/19/15 1833 02/19/15 1909 02/19/15 1957  GLUCAP >600* >600* 508*    Radiological Exams on Admission: US Renal  02/19/2015  CLINICAL DATA:  Left flank pain beginning this morning. History of renal stones. EXAM: RENAL / URINARY TRACT ULTRASOUND COMPLETE COMPARISON:  CT, 09/03/2014 FINDINGS: Right Kidney: Length: 11.8 cm. There is a vague round area of relative increased echogenicity in the central aspect of the right kidney suggesting a solid mass, measuring approximately 3.3 cm in greatest dimension. This is most  likely a prominent area of normal parenchyma  given that there was no mass on the prior CT. The kidneys are under rotated based on the prior CT. There is no stone no hydronephrosis. Left Kidney: Length: 10.4 cm. Echogenicity within normal limits. No mass or hydronephrosis visualized. Bladder: Appears normal for degree of bladder distention. IMPRESSION: 1. No renal stones. No hydronephrosis. No findings to explain left flank pain. 2. Vague rounded area of relative increased echogenicity in the right kidney centrally. This is most likely normal parenchyma. Consider follow-up nonemergent abdomen CT with contrast versus short-term follow-up renal ultrasound with repeat study in 3-4 months. Electronically Signed   By: Lajean Manes M.D.   On: 02/19/2015 19:53    EKG:  Not done in ED, ordered and pending    Assessment/Plan  1. DKA, type I DM  - Uncertain precipitant, likely viral enteritis vs non-adherence to insulin regimen  - Pt acknowledges missing her lunchtime insulin coverage on day of presentation, reports strict compliance otherwise  - Glucose >700 with bicarb 19 and AG 20  - Associated electrolyte derangements present  - Given 1 L NS bolus in ED, will give another and continue IVF with NS at 150 cc/hr until glucose <250  - On insulin infusion per protocol; will transition to sq when sugar <250, acidosis resolved, and gap is closed  - Carb-modified diet when appropriate  - A1c pending   2. AKI  - SCr 1.30, up from apparent baseline of 0.9  - Likely secondary to DKA and resultant intravascular volume depletion  - Anticipate resolution with IVF resuscitation   - Following serial BMPs overnight    3. Left flank pain  - Renal US not revealing  - CT abdomen suggests focal ileus vs enteritis in LUQ - No suggestion of obstructing stone or pyelonephritis  - Treating pain and DKA, expecting resolution  - May need further w/u if fails to improve   4. Elevated alk phos and t bili - Has history  of the same, attributed to steatosis  - Holding statin for now  - Does not appear related to the acute issues    DVT ppx:  SQ Lovenox      Code Status: Full code Family Communication: None at bed side.         Disposition Plan: Admit to inpatient   Date of Service 02/19/2015    Vianne Bulls, MD [facility] Pager [phone]  If 7PM-7AM, please contact night-coverage [email or web address] Password [facility] 02/19/2015, 8:51 PM

## 2015-02-19 NOTE — ED Notes (Signed)
Pt w/ blood glucose of 738 - pt A&Ox4, no acute distress at present. Will continue to monitor

## 2015-02-19 NOTE — ED Notes (Signed)
Pt reports left sided flank pain that started this morning. States she has also had nausea. No urinary symptoms.

## 2015-02-19 NOTE — ED Notes (Signed)
Attempted to call report x1. RN to call back.

## 2015-02-20 ENCOUNTER — Encounter (HOSPITAL_COMMUNITY): Payer: Self-pay | Admitting: *Deleted

## 2015-02-20 DIAGNOSIS — E0810 Diabetes mellitus due to underlying condition with ketoacidosis without coma: Secondary | ICD-10-CM

## 2015-02-20 DIAGNOSIS — R110 Nausea: Secondary | ICD-10-CM | POA: Insufficient documentation

## 2015-02-20 DIAGNOSIS — N179 Acute kidney failure, unspecified: Secondary | ICD-10-CM

## 2015-02-20 DIAGNOSIS — R109 Unspecified abdominal pain: Secondary | ICD-10-CM

## 2015-02-20 LAB — BASIC METABOLIC PANEL
Anion gap: 12 (ref 5–15)
Anion gap: 11 (ref 5–15)
Anion gap: 12 (ref 5–15)
BUN: 15 mg/dL (ref 6–20)
BUN: 14 mg/dL (ref 6–20)
BUN: 14 mg/dL (ref 6–20)
CO2: 25 mmol/L (ref 22–32)
CO2: 25 mmol/L (ref 22–32)
CO2: 27 mmol/L (ref 22–32)
Calcium: 8.9 mg/dL (ref 8.9–10.3)
Calcium: 8.7 mg/dL — ABNORMAL LOW (ref 8.9–10.3)
Calcium: 9 mg/dL (ref 8.9–10.3)
Chloride: 98 mmol/L — ABNORMAL LOW (ref 101–111)
Chloride: 100 mmol/L — ABNORMAL LOW (ref 101–111)
Chloride: 98 mmol/L — ABNORMAL LOW (ref 101–111)
Creatinine, Ser: 1.14 mg/dL — ABNORMAL HIGH (ref 0.44–1.00)
Creatinine, Ser: 1.22 mg/dL — ABNORMAL HIGH (ref 0.44–1.00)
Creatinine, Ser: 1.35 mg/dL — ABNORMAL HIGH (ref 0.44–1.00)
GFR calc Af Amer: >60 mL/min (ref >60)
GFR calc Af Amer: >60 mL/min (ref >60)
GFR calc Af Amer: 55 mL/min — ABNORMAL LOW (ref >60)
GFR calc non Af Amer: 58 mL/min — ABNORMAL LOW (ref >60)
GFR calc non Af Amer: 54 mL/min — ABNORMAL LOW (ref >60)
GFR calc non Af Amer: 48 mL/min — ABNORMAL LOW (ref >60)
Glucose, Bld: 222 mg/dL — ABNORMAL HIGH (ref 65–99)
Glucose, Bld: 223 mg/dL — ABNORMAL HIGH (ref 65–99)
Glucose, Bld: 220 mg/dL — ABNORMAL HIGH (ref 65–99)
Potassium: 3.5 mmol/L (ref 3.5–5.1)
Potassium: 4.1 mmol/L (ref 3.5–5.1)
Potassium: 3.9 mmol/L (ref 3.5–5.1)
Sodium: 135 mmol/L (ref 135–145)
Sodium: 136 mmol/L (ref 135–145)
Sodium: 137 mmol/L (ref 135–145)

## 2015-02-20 LAB — GLUCOSE, CAPILLARY
Glucose-Capillary: 203 mg/dL — ABNORMAL HIGH (ref 65–99)
Glucose-Capillary: 136 mg/dL — ABNORMAL HIGH (ref 65–99)
Glucose-Capillary: 116 mg/dL — ABNORMAL HIGH (ref 65–99)
Glucose-Capillary: 132 mg/dL — ABNORMAL HIGH (ref 65–99)
Glucose-Capillary: 169 mg/dL — ABNORMAL HIGH (ref 65–99)
Glucose-Capillary: 171 mg/dL — ABNORMAL HIGH (ref 65–99)
Glucose-Capillary: 188 mg/dL — ABNORMAL HIGH (ref 65–99)
Glucose-Capillary: 250 mg/dL — ABNORMAL HIGH (ref 65–99)
Glucose-Capillary: 216 mg/dL — ABNORMAL HIGH (ref 65–99)
Glucose-Capillary: 150 mg/dL — ABNORMAL HIGH (ref 65–99)

## 2015-02-20 MED ORDER — INSULIN GLARGINE 100 UNIT/ML ~~LOC~~ SOLN
20.0000 [IU] | Freq: Once | SUBCUTANEOUS | Status: AC
  Filled 2015-02-20: qty 0.2

## 2015-02-20 MED ORDER — INSULIN ASPART 100 UNIT/ML ~~LOC~~ SOLN: 0.0000 [IU] | Freq: Every day | SUBCUTANEOUS | Status: DC

## 2015-02-20 MED ORDER — INSULIN ASPART 100 UNIT/ML ~~LOC~~ SOLN
0.0000 [IU] | Freq: Three times a day (TID) | SUBCUTANEOUS | Status: DC
  Administered 2015-02-20: 5 [IU] via SUBCUTANEOUS
  Administered 2015-02-20: 3 [IU] via SUBCUTANEOUS
  Administered 2015-02-20: 5 [IU] via SUBCUTANEOUS
  Administered 2015-02-21: 8 [IU] via SUBCUTANEOUS

## 2015-02-20 MED ORDER — INSULIN ASPART 100 UNIT/ML ~~LOC~~ SOLN
5.0000 [IU] | Freq: Three times a day (TID) | SUBCUTANEOUS | Status: DC
Start: 2015-02-20 — End: 2015-02-21
  Administered 2015-02-20 – 2015-02-21 (×3): 5 [IU] via SUBCUTANEOUS

## 2015-02-20 MED ORDER — INSULIN GLARGINE 100 UNIT/ML ~~LOC~~ SOLN
20.0000 [IU] | Freq: Every day | SUBCUTANEOUS | Status: DC
  Administered 2015-02-20 (×2): 20 [IU] via SUBCUTANEOUS
  Filled 2015-02-20 (×3): qty 0.2

## 2015-02-20 NOTE — Care Management Note (Signed)
Case Management Note  Patient Details  Name: Maureen [name] MRN: [id number] Date of Birth: February 23, 1970  Subjective/Objective:                 Independent patient admitted from home in DKA 2/2 infectious process. Patient has all supplies and medications needed. Sick day rules reviewed by Diabetic Educator.    Action/Plan:  DC to home when medically stable. Expected Discharge Date:                  Expected Discharge Plan:  Home/Self Care  In-House Referral:     Discharge planning Services  CM Consult  Post Acute Care Choice:    Choice offered to:     DME Arranged:    DME Agency:     HH Arranged:    [place] Agency:     Status of Service:  Completed, signed off  Medicare Important Message Given:    Date Medicare IM Given:    Medicare IM give by:    Date Additional Medicare IM Given:    Additional Medicare Important Message give by:     If discussed at [place] of Stay Meetings, dates discussed:    Additional Comments:  Carles Collet, RN 02/20/2015, 3:01 PM

## 2015-02-20 NOTE — Progress Notes (Signed)
Spoke with patient regarding DKA admission.  She states she did not eat yesterday at lunch and therefore did not take insulin.  At this point, she is only ordered meal coverage with no correction scale to cover elevated CBG'sn at home.  Encouraged her to discuss with Dr. Chalmers Cater and request Correction scale for elevated blood sugars.  She has had diabetes since age 45 and states that her last A1C was approximately 10%.  She admits that the past year has been very stressful and she feels that this has contributed to her elevated A1C's.  Briefly discussed sick day rules with patient and reminded her of the importance of monitoring when sick and drinking plenty of fluids.  She states that her 2 children (ages 53 and 76) are very in tune to her diabetes and seem to know if her blood sugar is dropping.  Needs follow-up with Dr. Chalmers Cater after discharge.    Thanks, Adah Perl, RN, BC-ADM Inpatient Diabetes Coordinator Pager [phone] (8a-5p)

## 2015-02-20 NOTE — Progress Notes (Signed)
[facility] PROGRESS NOTE  Maureen [name] [id number] DOB: [birth year] DOA: 02/19/2015 PCP: [name], MD  Assessment/Plan: 1. Diabetic ketoacidosis -Maureen. [name] having a history of insulin-dependent diabetes mellitus presented with an elevated anion gap of 20 having blood sugar of 736. She was treated with aggressive IV fluid resuscitation, IV insulin, Lotrel replacement. -Overnight her anion gap closed and this morning she was transitioned to basal insulin with advancement of her diet -Diabetic coordinator recommending adding NovoLog 5 units subcutaneous with meals. -Will continue to monitor blood sugars over the course of the day and see how she tolerates by mouth intake.  2.  Left flank pain -She present with complaints of left flank pain initially, having a CT scan of abdomen and pelvis with IV contrast which was unrevealing. Renal ultrasound negative. -Urinalysis was negative -Patient reporting improvement by 02/20/2015. Unclear etiology of her flank pain.  3.   Acute kidney injury. -Initial lab work revealed creatinine of 1.3 likely reflecting profound dehydration in setting of diabetic ketoacidosis. -She was given volume resuscitation  4.  Hyponatremia. -Initial lab work revealed a sodium of 128, secondary to diabetic ketoacidosis. Repeat lab work this morning showing resolution of hyponatremia 136.  Code Status: full code  Family Communication:  Disposition Plan: anticipate discharge in the next 24 hours if she remains stable    HPI/Subjective: Maureen [name] is a pleasant 45 year old female with a past medical history of insulin dependent diabetes mellitus who was admitted to the medicine service on 02/19/2015 she initially presented with complaints of left flank pain. Initial lab work in the emergency department revealed a blood sugar of 736 with an elevated anion gap of 20 and bicarbonate of 19. She was treated for diabetic ketoacidosis, placed on the DKA  protocol, given aggressive IV fluid resuscitation, IV insulin, electrolyte replacement. By the following day her anion gap closed and she reported feeling significantly better. Her diet was advanced as she was started on basal insulin. With regard to her left flank pain she was worked up with a CT scan of abdomen and pelvis which was essentially unremarkable. Radiology reported a punctate nonobstructing left renal calculus.   Objective: Filed Vitals:   02/20/15 0531 02/20/15 1106  BP: 104/52 102/52  Pulse: 75 75  Temp: 97.5 F (36.4 C) 98.3 F (36.8 C)  Resp: 18 16    Intake/Output Summary (Last 24 hours) at 02/20/15 1454 Last data filed at 02/20/15 1035  Gross per 24 hour  Intake 1223.33 ml  Output    450 ml  Net 773.33 ml   Filed Weights   02/19/15 1509 02/20/15 0531  Weight: 76.658 kg (169 lb) 77.157 kg (170 lb 1.6 oz)    Exam:   General:  nontoxic appearing, awake and alert no acute distress   Cardiovascular: regular rate rhythm normal S1-S2   Respiratory: normal respiratory effort   Abdomen: soft she has mild left sided abdominal tenderness with palpation   Musculoskeletal:  no extremity edema   Data Reviewed: Basic Metabolic Panel:  Recent Labs Lab 02/19/15 1540 02/19/15 1909 02/19/15 2151 02/20/15 0022 02/20/15 0952  NA 128* 130* 133* 135 136  K 5.3* 4.2 3.6 3.5 4.1  CL 89* 94* 96* 98* 100*  CO2 19*  --  24 25 25   GLUCOSE 736* 598* 345* 222* 223*  BUN 15 18 15 15 14   CREATININE 1.30* 0.90 1.21* 1.14* 1.22*  CALCIUM 9.5  --  9.1 8.9 8.7*   Liver Function Tests:  Recent Labs Lab 02/19/15 1540  AST 26  ALT 23  ALKPHOS 172*  BILITOT 1.4*  PROT 7.9  ALBUMIN 4.0    Recent Labs Lab 02/19/15 1540  LIPASE 20   No results for input(s): AMMONIA in the last 168 hours. CBC:  Recent Labs Lab 02/19/15 1540 02/19/15 1909  WBC 14.8*  --   HGB 15.8* 18.0*  HCT 45.5 53.0*  MCV 92.9  --   PLT 344  --    Cardiac Enzymes: No results for  input(s): CKTOTAL, CKMB, CKMBINDEX, TROPONINI in the last 168 hours. BNP (last 3 results)  Recent Labs  03/26/14 1531 09/19/14 2057  BNP 27.9 9.1    ProBNP (last 3 results) No results for input(s): PROBNP in the last 8760 hours.  CBG:  Recent Labs Lab 02/20/15 0358 02/20/15 0513 02/20/15 0622 02/20/15 0726 02/20/15 1211  GLUCAP 132* 169* 171* 188* 250*    Recent Results (from the past 240 hour(s))  Culture, blood (routine x 2)     Status: None (Preliminary result)   Collection Time: 02/19/15  9:50 PM  Result Value Ref Range Status   Specimen Description BLOOD RIGHT HAND  Final   Special Requests BOTTLES DRAWN AEROBIC AND ANAEROBIC 5ML  Final   Culture NO GROWTH < 12 HOURS  Final   Report Status PENDING  Incomplete  Culture, blood (routine x 2)     Status: None (Preliminary result)   Collection Time: 02/19/15  9:54 PM  Result Value Ref Range Status   Specimen Description BLOOD BLOOD RIGHT FOREARM  Final   Special Requests BOTTLES DRAWN AEROBIC AND ANAEROBIC 5ML  Final   Culture NO GROWTH < 12 HOURS  Final   Report Status PENDING  Incomplete     Studies: Ct Abdomen Pelvis W Contrast  02/19/2015  CLINICAL DATA:  Left flank pain, onset this morning.  Nausea. EXAM: CT ABDOMEN AND PELVIS WITH CONTRAST TECHNIQUE: Multidetector CT imaging of the abdomen and pelvis was performed using the standard protocol following bolus administration of intravenous contrast. CONTRAST:  133mL OMNIPAQUE IOHEXOL 300 MG/ML  SOLN COMPARISON:  Renal ultrasound earlier this day. Most recent CT 09/03/2014 FINDINGS: Lower chest:  The included lung bases are clear. Liver: No focal lesion. Hepatobiliary: Clips in the gallbladder fossa postcholecystectomy. No biliary dilatation. Pancreas: No ductal dilatation or inflammation. Spleen: Normal. Adrenal glands: No nodule. Kidneys: Symmetric renal enhancement. No hydronephrosis. 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 Small 2 mm  calcification, nonobstructing left renal stone versus parenchymal calcification unchanged. Stomach/Bowel: Stomach physiologically distended. There are prominent fluid-filled small bowel loops in the left upper quadrant of the abdomen, measuring up to 2.9 cm. No definite associated bowel wall thickening, questionable mild edema in the adjacent mesentery. Remaining small bowel loops are decompressed. Small volume of stool throughout the colon without colonic wall thickening. Surgical clip at the base of the cecum post appendectomy. Vascular/Lymphatic: No retroperitoneal adenopathy. Abdominal aorta is normal in caliber. Accessory lower pole left renal arteries. Mild atherosclerosis. Reproductive: Uterus is surgically absent.  No adnexal mass. Bladder: Physiologically distended, no wall thickening. Other: No free air, free fluid, or intra-abdominal fluid collection. Musculoskeletal: There are no acute or suspicious osseous abnormalities. Transitional lumbosacral anatomy. IMPRESSION: 1. A few prominent fluid-filled small bowel loops in the left upper quadrant of the abdomen, may reflect enteritis or focal ileus. No evidence of obstruction. 2. Punctate nonobstructing left renal calculus versus parenchymal calcification. Electronically Signed   By: Jeb Levering M.D.   On: 02/19/2015 21:53   US Renal  02/19/2015  CLINICAL DATA:  Left flank pain beginning this morning. History of renal stones. EXAM: RENAL / URINARY TRACT ULTRASOUND COMPLETE COMPARISON:  CT, 09/03/2014 FINDINGS: Right Kidney: Length: 11.8 cm. There is a vague round area of relative increased echogenicity in the central aspect of the right kidney suggesting a solid mass, measuring approximately 3.3 cm in greatest dimension. This is most likely a prominent area of normal parenchyma given that there was no mass on the prior CT. The kidneys are under rotated based on the prior CT. There is no stone no hydronephrosis. Left Kidney: Length: 10.4 cm.  Echogenicity within normal limits. No mass or hydronephrosis visualized. Bladder: Appears normal for degree of bladder distention. IMPRESSION: 1. No renal stones. No hydronephrosis. No findings to explain left flank pain. 2. Vague rounded area of relative increased echogenicity in the right kidney centrally. This is most likely normal parenchyma. Consider follow-up nonemergent abdomen CT with contrast versus short-term follow-up renal ultrasound with repeat study in 3-4 months. Electronically Signed   By: Lajean Manes M.D.   On: 02/19/2015 19:53    Scheduled Meds: . enoxaparin (LOVENOX) injection  40 mg Subcutaneous Q24H  . FLUoxetine  40 mg Oral Daily  . insulin aspart  0-15 Units Subcutaneous TID WC  . insulin aspart  0-5 Units Subcutaneous QHS  . insulin aspart  5 Units Subcutaneous TID WC  . insulin glargine  20 Units Subcutaneous QHS   Continuous Infusions:   Principal Problem:   DKA (diabetic ketoacidoses) (H[facility]) Active Problems:   DM type 1 (diabetes mellitus, type 1) (HCC)   Abnormal LFTs   Acute kidney injury ([place])   Left flank pain   Nausea    Time spent: 30 min    Kelvin Cellar  [facility] Pager [phone]. If 7PM-7AM, please contact night-coverage at [email or web address], password [facility] 02/20/2015, 2:54 PM  LOS: 1 day

## 2015-02-20 NOTE — Progress Notes (Signed)
Inpatient Diabetes Program Recommendations  AACE/ADA: New Consensus Statement on Inpatient Glycemic Control (2015)  Target Ranges:  Prepandial:   less than 140 mg/dL      Peak postprandial:   less than 180 mg/dL (1-2 hours)      Critically ill patients:  140 - 180 mg/dL   Review of Glycemic Control:  Note patient admitted with mild DKA.  Transitioned off insulin drip this morning.  Diabetes history: Type 1 diabetes Outpatient Diabetes medications: Lantus 26 units daily, Novolog 8 units tid with meals Current orders for Inpatient glycemic control:  Lantus 20 units q HS, Novolog moderate tid with meals and HS  Inpatient Diabetes Program Recommendations:    Please add Novolog meal coverage 5 units tid with meals to cover CHO from meal intake.  Thanks, Adah Perl, RN, BC-ADM Inpatient Diabetes Coordinator Pager [phone] (8a-5p)

## 2015-02-21 LAB — GLUCOSE, CAPILLARY
Glucose-Capillary: 246 mg/dL — ABNORMAL HIGH (ref 65–99)
Glucose-Capillary: 259 mg/dL — ABNORMAL HIGH (ref 65–99)
Glucose-Capillary: 113 mg/dL — ABNORMAL HIGH (ref 65–99)

## 2015-02-21 LAB — URINE CULTURE

## 2015-02-21 LAB — BASIC METABOLIC PANEL
Anion gap: 10 (ref 5–15)
BUN: 11 mg/dL (ref 6–20)
CO2: 28 mmol/L (ref 22–32)
Calcium: 8.8 mg/dL — ABNORMAL LOW (ref 8.9–10.3)
Chloride: 99 mmol/L — ABNORMAL LOW (ref 101–111)
Creatinine, Ser: 0.95 mg/dL (ref 0.44–1.00)
GFR calc Af Amer: >60 mL/min (ref >60)
GFR calc non Af Amer: >60 mL/min (ref >60)
Glucose, Bld: 287 mg/dL — ABNORMAL HIGH (ref 65–99)
Potassium: 4.2 mmol/L (ref 3.5–5.1)
Sodium: 137 mmol/L (ref 135–145)

## 2015-02-21 LAB — CBC
HCT: 40.1 % (ref 36.0–46.0)
Hemoglobin: 14.1 g/dL (ref 12.0–15.0)
MCH: 32.8 pg (ref 26.0–34.0)
MCHC: 35.2 g/dL (ref 30.0–36.0)
MCV: 93.3 fL (ref 78.0–100.0)
Platelets: 393 10*3/uL (ref 150–400)
RBC: 4.3 MIL/uL (ref 3.87–5.11)
RDW: 12.6 % (ref 11.5–15.5)
WBC: 8.3 10*3/uL (ref 4.0–10.5)

## 2015-02-21 LAB — HEMOGLOBIN A1C
Hgb A1c MFr Bld: 9.7 % — ABNORMAL HIGH (ref 4.8–5.6)
Mean Plasma Glucose: 232 mg/dL

## 2015-02-21 LAB — PROCALCITONIN: Procalcitonin: 1.12 ng/mL

## 2015-02-21 MED ORDER — AMOXICILLIN-POT CLAVULANATE 875-125 MG PO TABS: 1.0000 | ORAL_TABLET | Freq: Two times a day (BID) | ORAL | Status: DC

## 2015-02-21 NOTE — Progress Notes (Signed)
Inpatient Diabetes Program Recommendations  AACE/ADA: New Consensus Statement on Inpatient Glycemic Control (2015)  Target Ranges:  Prepandial:   less than 140 mg/dL      Peak postprandial:   less than 180 mg/dL (1-2 hours)      Critically ill patients:  140 - 180 mg/dL   Review of Glycemic Control:  Results for CAMERON, FREDRICK (MRN [id number]) as of 02/21/2015 10:28  Ref. Range 02/20/2015 12:11 02/20/2015 16:50 02/20/2015 21:10 02/21/2015 01:19 02/21/2015 07:36  Glucose-Capillary Latest Ref Range: 65-99 mg/dL 250 (H) 216 (H) 150 (H) 246 (H) 259 (H)    Inpatient Diabetes Program Recommendations:    Note fasting greater than goal. Please increase Lantus to home dose of 26 units daily.  Thanks, Adah Perl, RN, BC-ADM Inpatient Diabetes Coordinator Pager [phone] (8a-5p)

## 2015-02-21 NOTE — Progress Notes (Signed)
Nsg Discharge Note  Admit Date:  02/19/2015 Discharge date: 02/21/2015   Maureen [name] to be D/C'd Home per MD order.  AVS completed.  Copy for chart, and copy for patient signed, and dated. Patient/caregiver able to verbalize understanding.  Discharge Medication:   Medication List    STOP taking these medications        HYDROcodone-acetaminophen 5-325 MG tablet  Commonly known as:  NORCO/VICODIN      TAKE these medications        acetaminophen 500 MG tablet  Commonly known as:  TYLENOL  Take 500 mg by mouth every 6 (six) hours as needed for mild pain. Reported on 01/23/2015     amoxicillin-clavulanate 875-125 MG tablet  Commonly known as:  AUGMENTIN  Take 1 tablet by mouth 2 (two) times daily.     atorvastatin 40 MG tablet  Commonly known as:  LIPITOR  Take 1 tablet (40 mg total) by mouth daily.     cyclobenzaprine 5 MG tablet  Commonly known as:  FLEXERIL  Take 5 mg by mouth 3 (three) times daily as needed for muscle spasms.     cyproheptadine 4 MG tablet  Commonly known as:  PERIACTIN  Take 1/2-1 pill up to 3 times daily as needed for itching     FLUoxetine 40 MG capsule  Commonly known as:  PROZAC  Take 40 mg by mouth daily.     insulin aspart 100 UNIT/ML injection  Commonly known as:  novoLOG  Inject 8 Units into the skin 3 (three) times daily with meals.     insulin glargine 100 UNIT/ML injection  Commonly known as:  LANTUS  Inject 26 Units into the skin at bedtime.     Oxycodone HCl 10 MG Tabs  Take 10 mg by mouth every 8 (eight) hours as needed (pain).     zolpidem 5 MG tablet  Commonly known as:  AMBIEN  Take 1 tablet (5 mg total) by mouth at bedtime as needed for sleep.        Discharge Assessment: Filed Vitals:   02/21/15 0218 02/21/15 0545  BP: 116/67 131/67  Pulse: 72 72  Temp: 97.8 F (36.6 C) 97.8 F (36.6 C)  Resp: 16 16   Skin clean, dry and intact without evidence of skin break down, no evidence of skin tears noted. IV catheter  discontinued intact. Site without signs and symptoms of complications - no redness or edema noted at insertion site, patient denies c/o pain - only slight tenderness at site.  Dressing with slight pressure applied.  D/c Instructions-Education: Discharge instructions given to patient/family with verbalized understanding. D/c education completed with patient/family including follow up instructions, medication list, d/c activities limitations if indicated, with other d/c instructions as indicated by MD - patient able to verbalize understanding, all questions fully answered. Patient instructed to return to ED, call 911, or call MD for any changes in condition.    Dayle Points, RN 02/21/2015 1:42 PM

## 2015-02-21 NOTE — Discharge Summary (Signed)
Physician Discharge Summary  Maureen [name] [id number] DOB: Oct 25, 1970 DOA: 02/19/2015  PCP: [name], MD  Admit date: 02/19/2015 Discharge date: 02/21/2015  Time spent: 35 minutes  Recommendations for Outpatient Follow-up:  1. Please follow-up on blood sugars, she was admitted for diabetic ketoacidosis 2. Recommend repeat BMP on hospital follow-up visit 3. Please follow-up on left ear pain, patient concerned about this representing infection.   Discharge Diagnoses:  Principal Problem:   DKA (diabetic ketoacidoses) ([place]) Active Problems:   DM type 1 (diabetes mellitus, type 1) ([facility])   Abnormal LFTs   Acute kidney injury ([place])   Left flank pain   Nausea   Discharge Condition: Stable  Diet recommendation: Carb Mod Diet  Filed Weights   02/19/15 1509 02/20/15 0531  Weight: 76.658 kg (169 lb) 77.157 kg (170 lb 1.6 oz)    History of present illness:   Maureen [name] is a 45 y.o. female with PMH of type 1 diabetes mellitus, major depressive disorder, fibromyalgia, and history of renal calculi who presents to the ED with 1 day of left flank pain with nausea. Patient reports that yesterday she was in her usual state of health, and felt well upon waking this morning. Late in the morning, she developed severe, achy, constant left flank pain without radiation. She is unable to identify any alleviating or exacerbating factors. She denies dysuria or hematuria, and while she endorses lower abdominal pain, she states that this is chronic. She has had similar symptoms previously when she had a kidney stone. She reports a subjective fever approximately 2 days ago, but no cough, dyspnea, vomiting, or diarrhea. She denies any recent alcohol or illicit drug use. She acknowledges missing her lunchtime dose of NovoLog today, but states that she's otherwise been compliant with her regimen. Patient had an episode of DKA approximately one year ago, precipitated by pulmonary embolism. There  is no cough, pelvic notations, chest pain, hemoptysis, or leg pain or swelling.  In ED, patient was found to be afebrile, saturating well on room air, and with vital signs stable. Initial blood work was notable for a serum glucose of 736 with potassium of 5.3 and serum creatinine of 1.3, up from apparent baseline of 0.9. There were other electrolyte derangements as well; bicarbonate is 19 and anion gap is 20. Patient is noted to have a leukocytosis to 14,800, with hemoglobin elevated to a value of 15.8. Ketones are noted in the urine. A renal ultrasound was obtained in the emergency department and negative for stone or hydronephrosis. Vague area was noted in the central right kidney which is likely normal parenchyma, but is said to warrant nonemergent follow-up. Patient was bolused 1 L normal saline in the emergency department and started on stabilizer infusion. She is awaiting CT of the abdomen with contrast at this time and will be admitted for ongoing evaluation and management of DKA and left flank pain.  Hospital Course:   Maureen [name] is a pleasant 45 year old female with a past medical history of insulin dependent diabetes mellitus who was admitted to the medicine service on 02/19/2015 she initially presented with complaints of left flank pain. Initial lab work in the emergency department revealed a blood sugar of 736 with an elevated anion gap of 20 and bicarbonate of 19. She was treated for diabetic ketoacidosis, placed on the DKA protocol, given aggressive IV fluid resuscitation, IV insulin, electrolyte replacement. By the following day her anion gap closed and she reported feeling significantly better. Her diet was advanced as  she was started on basal insulin. With regard to her left flank pain she was worked up with a CT scan of abdomen and pelvis.  Radiology reported a punctate nonobstructing left renal calculus as well as a few prominent fluid-filled small bowel loops in the upper quadrant of the  abdomen which could have the appearance of a focal ileitis or enteritis. She showed overall clinical improvement. On 02/21/2015 she had reported having left ear pain. I examined her left ear, cannot visualize tympanic membrane due to cerumen however external ear canal appeared erythematous. She reported being treated with antibiotics for left ear infection last year. She was discharged on Augmentin. Please follow up on her post hospital visit. She was discharged to home in stable condition on 02/21/2015.    Discharge Exam: Filed Vitals:   02/21/15 0218 02/21/15 0545  BP: 116/67 131/67  Pulse: 72 72  Temp: 97.8 F (36.6 C) 97.8 F (36.6 C)  Resp: 16 16     General: nontoxic appearing, awake and alert no acute distress   Cardiovascular: regular rate rhythm normal S1-S2   Respiratory: normal respiratory effort   Abdomen: soft she has mild left sided abdominal tenderness with palpation   Musculoskeletal: no extremity edema  Discharge Instructions   Discharge Instructions    Call MD for:  difficulty breathing, headache or visual disturbances    Complete by:  As directed      Call MD for:  extreme fatigue    Complete by:  As directed      Call MD for:  hives    Complete by:  As directed      Call MD for:  persistant dizziness or light-headedness    Complete by:  As directed      Call MD for:  persistant nausea and vomiting    Complete by:  As directed      Call MD for:  redness, tenderness, or signs of infection (pain, swelling, redness, odor or green/yellow discharge around incision site)    Complete by:  As directed      Call MD for:  severe uncontrolled pain    Complete by:  As directed      Call MD for:  temperature >100.4    Complete by:  As directed      Call MD for:    Complete by:  As directed      Diet - low sodium heart healthy    Complete by:  As directed      Increase activity slowly    Complete by:  As directed           Discharge Medication List as  of 02/21/2015 12:47 PM    START taking these medications   Details  amoxicillin-clavulanate (AUGMENTIN) 875-125 MG tablet Take 1 tablet by mouth 2 (two) times daily., Starting 02/21/2015, Until Discontinued, Print      CONTINUE these medications which have NOT CHANGED   Details  acetaminophen (TYLENOL) 500 MG tablet Take 500 mg by mouth every 6 (six) hours as needed for mild pain. Reported on 01/23/2015, Until Discontinued, Historical Med    atorvastatin (LIPITOR) 40 MG tablet Take 1 tablet (40 mg total) by mouth daily., Starting 12/05/2014, Until Discontinued, Normal    cyclobenzaprine (FLEXERIL) 5 MG tablet Take 5 mg by mouth 3 (three) times daily as needed for muscle spasms. , Starting 01/19/2015, Until Discontinued, Historical Med    FLUoxetine (PROZAC) 40 MG capsule Take 40 mg by mouth daily., Until Discontinued, Historical  Med    insulin aspart (NOVOLOG) 100 UNIT/ML injection Inject 8 Units into the skin 3 (three) times daily with meals., Starting 10/16/2013, Until Discontinued, No Print    insulin glargine (LANTUS) 100 UNIT/ML injection Inject 26 Units into the skin at bedtime. , Until Discontinued, Historical Med    Oxycodone HCl 10 MG TABS Take 10 mg by mouth every 8 (eight) hours as needed (pain)., Until Discontinued, Historical Med    cyproheptadine (PERIACTIN) 4 MG tablet Take 1/2-1 pill up to 3 times daily as needed for itching, Normal    zolpidem (AMBIEN) 5 MG tablet Take 1 tablet (5 mg total) by mouth at bedtime as needed for sleep., Starting 01/23/2015, Until Discontinued, Print      STOP taking these medications     HYDROcodone-acetaminophen (NORCO/VICODIN) 5-325 MG tablet        Allergies  Allergen Reactions  . Cephalexin Rash    Pt was admitted to the hospital upon taking.  . Ibuprofen Nausea And Vomiting and Rash  . Meloxicam Nausea Only      The results of significant diagnostics from this hospitalization (including imaging, microbiology, ancillary and  laboratory) are listed below for reference.    Significant Diagnostic Studies: Ct Abdomen Pelvis W Contrast  02/19/2015  CLINICAL DATA:  Left flank pain, onset this morning.  Nausea. EXAM: CT ABDOMEN AND PELVIS WITH CONTRAST TECHNIQUE: Multidetector CT imaging of the abdomen and pelvis was performed using the standard protocol following bolus administration of intravenous contrast. CONTRAST:  160mL OMNIPAQUE IOHEXOL 300 MG/ML  SOLN COMPARISON:  Renal ultrasound earlier this day. Most recent CT 09/03/2014 FINDINGS: Lower chest:  The included lung bases are clear. Liver: No focal lesion. Hepatobiliary: Clips in the gallbladder fossa postcholecystectomy. No biliary dilatation. Pancreas: No ductal dilatation or inflammation. Spleen: Normal. Adrenal glands: No nodule. Kidneys: Symmetric renal enhancement. No hydronephrosis. 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 Small 2 mm calcification, nonobstructing left renal stone versus parenchymal calcification unchanged. Stomach/Bowel: Stomach physiologically distended. There are prominent fluid-filled small bowel loops in the left upper quadrant of the abdomen, measuring up to 2.9 cm. No definite associated bowel wall thickening, questionable mild edema in the adjacent mesentery. Remaining small bowel loops are decompressed. Small volume of stool throughout the colon without colonic wall thickening. Surgical clip at the base of the cecum post appendectomy. Vascular/Lymphatic: No retroperitoneal adenopathy. Abdominal aorta is normal in caliber. Accessory lower pole left renal arteries. Mild atherosclerosis. Reproductive: Uterus is surgically absent.  No adnexal mass. Bladder: Physiologically distended, no wall thickening. Other: No free air, free fluid, or intra-abdominal fluid collection. Musculoskeletal: There are no acute or suspicious osseous abnormalities. Transitional lumbosacral anatomy. IMPRESSION: 1. A few prominent fluid-filled small bowel  loops in the left upper quadrant of the abdomen, may reflect enteritis or focal ileus. No evidence of obstruction. 2. Punctate nonobstructing left renal calculus versus parenchymal calcification. Electronically Signed   By: Jeb Levering M.D.   On: 02/19/2015 21:53   US Renal  02/19/2015  CLINICAL DATA:  Left flank pain beginning this morning. History of renal stones. EXAM: RENAL / URINARY TRACT ULTRASOUND COMPLETE COMPARISON:  CT, 09/03/2014 FINDINGS: Right Kidney: Length: 11.8 cm. There is a vague round area of relative increased echogenicity in the central aspect of the right kidney suggesting a solid mass, measuring approximately 3.3 cm in greatest dimension. This is most likely a prominent area of normal parenchyma given that there was no mass on the prior CT. The kidneys are under rotated  based on the prior CT. There is no stone no hydronephrosis. Left Kidney: Length: 10.4 cm. Echogenicity within normal limits. No mass or hydronephrosis visualized. Bladder: Appears normal for degree of bladder distention. IMPRESSION: 1. No renal stones. No hydronephrosis. No findings to explain left flank pain. 2. Vague rounded area of relative increased echogenicity in the right kidney centrally. This is most likely normal parenchyma. Consider follow-up nonemergent abdomen CT with contrast versus short-term follow-up renal ultrasound with repeat study in 3-4 months. Electronically Signed   By: Lajean Manes M.D.   On: 02/19/2015 19:53    Microbiology: Recent Results (from the past 240 hour(s))  Urine culture     Status: None   Collection Time: 02/19/15  5:25 PM  Result Value Ref Range Status   Specimen Description URINE, CLEAN CATCH  Final   Special Requests NONE  Final   Culture MULTIPLE SPECIES PRESENT, SUGGEST RECOLLECTION  Final   Report Status 02/21/2015 FINAL  Final  Culture, blood (routine x 2)     Status: None (Preliminary result)   Collection Time: 02/19/15  9:50 PM  Result Value Ref Range Status    Specimen Description BLOOD RIGHT HAND  Final   Special Requests BOTTLES DRAWN AEROBIC AND ANAEROBIC 5ML  Final   Culture NO GROWTH 2 DAYS  Final   Report Status PENDING  Incomplete  Culture, blood (routine x 2)     Status: None (Preliminary result)   Collection Time: 02/19/15  9:54 PM  Result Value Ref Range Status   Specimen Description BLOOD BLOOD RIGHT FOREARM  Final   Special Requests BOTTLES DRAWN AEROBIC AND ANAEROBIC 5ML  Final   Culture NO GROWTH 2 DAYS  Final   Report Status PENDING  Incomplete     Labs: Basic Metabolic Panel:  Recent Labs Lab 02/19/15 2151 02/20/15 0022 02/20/15 0952 02/20/15 1441 02/21/15 0451  NA 133* 135 136 137 137  K 3.6 3.5 4.1 3.9 4.2  CL 96* 98* 100* 98* 99*  CO2 24 25 25 27 28   GLUCOSE 345* 222* 223* 220* 287*  BUN 15 15 14 14 11   CREATININE 1.21* 1.14* 1.22* 1.35* 0.95  CALCIUM 9.1 8.9 8.7* 9.0 8.8*   Liver Function Tests:  Recent Labs Lab 02/19/15 1540  AST 26  ALT 23  ALKPHOS 172*  BILITOT 1.4*  PROT 7.9  ALBUMIN 4.0    Recent Labs Lab 02/19/15 1540  LIPASE 20   No results for input(s): AMMONIA in the last 168 hours. CBC:  Recent Labs Lab 02/19/15 1540 02/19/15 1909 02/21/15 0451  WBC 14.8*  --  8.3  HGB 15.8* 18.0* 14.1  HCT 45.5 53.0* 40.1  MCV 92.9  --  93.3  PLT 344  --  393   Cardiac Enzymes: No results for input(s): CKTOTAL, CKMB, CKMBINDEX, TROPONINI in the last 168 hours. BNP: BNP (last 3 results)  Recent Labs  03/26/14 1531 09/19/14 2057  BNP 27.9 9.1    ProBNP (last 3 results) No results for input(s): PROBNP in the last 8760 hours.  CBG:  Recent Labs Lab 02/20/15 1650 02/20/15 2110 02/21/15 0119 02/21/15 0736 02/21/15 1156  GLUCAP 216* 150* 246* 259* 113*       Signed:  Kelvin Cellar MD.  [facility] 02/21/2015, 4:05 PM

## 2015-02-23 ENCOUNTER — Ambulatory Visit (INDEPENDENT_AMBULATORY_CARE_PROVIDER_SITE_OTHER): Payer: BLUE CROSS/BLUE SHIELD | Admitting: Family Medicine

## 2015-02-23 VITALS — BP 124/72 | HR 113 | Temp 98.6°F | Resp 18 | Ht 64.5 in | Wt 165.2 lb

## 2015-02-23 DIAGNOSIS — L299 Pruritus, unspecified: Secondary | ICD-10-CM

## 2015-02-23 DIAGNOSIS — E108 Type 1 diabetes mellitus with unspecified complications: Secondary | ICD-10-CM | POA: Diagnosis not present

## 2015-02-23 LAB — BASIC METABOLIC PANEL WITH GFR
BUN: 11 mg/dL (ref 7–25)
CO2: 28 mmol/L (ref 20–31)
Calcium: 10.4 mg/dL — ABNORMAL HIGH (ref 8.6–10.2)
Chloride: 97 mmol/L — ABNORMAL LOW (ref 98–110)
Creat: 1.01 mg/dL (ref 0.50–1.10)
GFR, Est African American: 79 mL/min (ref >=60)
GFR, Est Non African American: 69 mL/min (ref >=60)
Glucose, Bld: 141 mg/dL — ABNORMAL HIGH (ref 65–99)
Potassium: 4.4 mmol/L (ref 3.5–5.3)
Sodium: 135 mmol/L (ref 135–146)

## 2015-02-23 LAB — POCT GLYCOSYLATED HEMOGLOBIN (HGB A1C): Hemoglobin A1C: 9.3

## 2015-02-23 MED ORDER — CYPROHEPTADINE HCL 4 MG PO TABS: ORAL_TABLET | ORAL | Status: DC

## 2015-02-23 NOTE — Progress Notes (Addendum)
By signing my name below, I, Rawaa Al Rifaie, attest that this documentation has been prepared under the direction and in the presence of [name], [place], Medical Scribe. 02/23/2015.  1:24 PM.  Patient ID: Maureen [name] MRN: [id number], DOB: [birth year], 45 y.o. Date of Encounter: 02/23/2015  Primary Physician: [name], MD  Chief Complaint:  Chief Complaint  Patient presents with  . Follow-up    HPI:  Maureen [name] is a 45 y.o. female with a hx of DM, HLD, PE who presents to [facility] for a follow up.   PE: Pt reports that she was diagnosed with PE in August of last year, she was then presented with PNA. Pt states that she was taken off xarelto by Dr. Lorelei Pont in December.   Anxiety: Pt reports that Dr. Linna Darner advised her that she was suffering of anxiety at her last visit with her. She was prescribed a medication that she indicates found relief with. She denies experiencing side effects with this. Pt does not want to be on any anti anxiety medications.   Diabetes: She notes that her blood sugar has been mostly controlled, however she indicates that it does increased at time. Pt was seen at the hospital where she presented with ketoacidosis, however she indicated that that day she did not eat properly.   Shortness of breath: Pt states that she still experiences mild shortness of breath. She is exercising more now that she is started to gain weight recently.   Leg swelling: She also reports that she had an episode of leg swelling last week that has resolved now, but she notes that she does not have a similar history of that.    She reports that she had CT done that showed small area of intestine that is infected.   Pt is currently in school getting her BS degree. She indicates that her youngest child received a full scholarship to [employer]. Pt's divorce has been finalized.    Past Medical History  Diagnosis  Date  . 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 Fibromyalgia   . Hyperlipidemia   . Abdominal pain   . Nausea & vomiting   . Biliary dyskinesia   . Depression   . Joint pain   . Skin rash     due to medications  . Post traumatic stress disorder (PTSD)   . Sleep trouble   . Major depressive disorder ([place])   . Cervical strain   . Closed head injury 2010  . Renal stones   . Migraine     hx of none recent  . Arthritis   . Hypoglycemia   . Anxiety   . Pulmonary embolism ([place])      Home Meds: Prior to Admission medications   Medication Sig Start Date End Date Taking? Authorizing Provider  acetaminophen (TYLENOL) 500 MG tablet Take 500 mg by mouth every 6 (six) hours as needed for mild pain. Reported on 01/23/2015   Yes Historical Provider, MD  atorvastatin (LIPITOR) 40 MG tablet Take 1 tablet (40 mg total) by mouth daily. Patient taking differently: Take 40 mg by mouth daily at 6 PM.  12/05/14  Yes Gay Filler Copland, MD  cyclobenzaprine (FLEXERIL) 5 MG tablet Take 5 mg by mouth 3 (three) times daily as needed for muscle spasms.  01/19/15  Yes Historical Provider, MD  FLUoxetine (PROZAC) 40 MG capsule Take 40 mg by mouth daily.  Yes Historical Provider, MD  insulin aspart (NOVOLOG) 100 UNIT/ML injection Inject 8 Units into the skin 3 (three) times daily with meals. 10/16/13  Yes Kerrie Buffalo, NP  insulin glargine (LANTUS) 100 UNIT/ML injection Inject 26 Units into the skin at bedtime.    Yes Historical Provider, MD  Oxycodone HCl 10 MG TABS Take 10 mg by mouth every 8 (eight) hours as needed (pain).   Yes Historical Provider, MD  cyproheptadine (PERIACTIN) 4 MG tablet Take 1/2-1 pill up to 3 times daily as needed for itching Patient not taking: Reported on 02/19/2015 01/17/15   Posey Boyer, MD  zolpidem (AMBIEN) 5 MG tablet Take 1 tablet (5 mg total) by mouth at bedtime as needed for sleep. Patient not taking: Reported on 02/19/2015 01/23/15   Thao  P Le, DO    Allergies:  Allergies  Allergen Reactions  . Cephalexin Rash    Pt was admitted to the hospital upon taking.  . Ibuprofen Nausea And Vomiting and Rash  . Meloxicam Nausea Only    Social History   Social History  . Marital Status: Divorced    Spouse Name: Scheryl Sol  . Number of Children: 2  . Years of Education: 12+   Occupational History  . stay at home mom    Social History Main Topics  . Smoking status: Never Smoker   . Smokeless tobacco: Never Used  . Alcohol Use: No  . Drug Use: No  . Sexual Activity: No   Other Topics Concern  . Not on file   Social History Narrative   Lives with husband and two sons.  Before going back to school, she was a Research scientist (physical sciences) at [employer].  Stop working when she was diagnosed with endometriosis and had a TIA.     Review of Systems: Constitutional: negative for chills, fever, night sweats, weight changes, or fatigue  HEENT: negative for vision changes, hearing loss, congestion, rhinorrhea, ST, epistaxis, or sinus pressure Cardiovascular: negative for chest pain or palpitations. Positive for leg swelling.  Respiratory: negative for hemoptysis, wheezing, or cough. Positive for shortness of breath. Abdominal: negative for abdominal pain, nausea, vomiting, diarrhea, or constipation Dermatological: negative for rash Neurologic: negative for headache, dizziness, or syncope Psych: Positive for anxiety. All other systems reviewed and are otherwise negative with the exception to those above and in the HPI.  Physical Exam: Blood pressure 124/72, pulse 113, temperature 98.6 F (37 C), temperature source Oral, resp. rate 18, height 5' 4.5" (1.638 m), weight 165 lb 3.2 oz (74.934 kg), SpO2 98 %., Body mass index is 27.93 kg/(m^2). General: Well developed, well nourished, in no acute distress. Head: Normocephalic, atraumatic, eyes without discharge, sclera non-icteric, nares are without discharge. Bilateral auditory  canals clear, TM's are without perforation, pearly grey and translucent with reflective cone of light bilaterally. Oral cavity moist, posterior pharynx without exudate, erythema, peritonsillar abscess, or post nasal drip.  Neck: Supple. No thyromegaly. Full ROM. No lymphadenopathy. Lungs: Clear bilaterally to auscultation without wheezes, rales, or rhonchi. Breathing is unlabored. Heart: RRR with S1 S2. No murmurs, rubs, or gallops appreciated. Abdomen: Soft, non-tender, non-distended with normoactive bowel sounds. No hepatomegaly. No rebound/guarding. No obvious abdominal masses. Msk:  Strength and tone normal for age. Extremities/Skin: Warm and dry. No clubbing or cyanosis. No edema. No rashes or suspicious lesions. Neuro: Alert and oriented X 3. Moves all extremities spontaneously. Gait is normal. CNII-XII grossly in tact. Psych:  Responds to questions appropriately with a normal affect.  Labs: Results for orders placed or performed during the hospital encounter of 02/19/15  Culture, blood (routine x 2)  Result Value Ref Range   Specimen Description BLOOD RIGHT HAND    Special Requests BOTTLES DRAWN AEROBIC AND ANAEROBIC 5ML    Culture NO GROWTH 4 DAYS    Report Status PENDING   Culture, blood (routine x 2)  Result Value Ref Range   Specimen Description BLOOD BLOOD RIGHT FOREARM    Special Requests BOTTLES DRAWN AEROBIC AND ANAEROBIC 5ML    Culture NO GROWTH 4 DAYS    Report Status PENDING   Urine culture  Result Value Ref Range   Specimen Description URINE, CLEAN CATCH    Special Requests NONE    Culture MULTIPLE SPECIES PRESENT, SUGGEST RECOLLECTION    Report Status 02/21/2015 FINAL   Lipase, blood  Result Value Ref Range   Lipase 20 11 - 51 U/L  Comprehensive metabolic panel  Result Value Ref Range   Sodium 128 (L) 135 - 145 mmol/L   Potassium 5.3 (H) 3.5 - 5.1 mmol/L   Chloride 89 (L) 101 - 111 mmol/L   CO2 19 (L) 22 - 32 mmol/L   Glucose, Bld 736 (HH) 65 - 99 mg/dL    BUN 15 6 - 20 mg/dL   Creatinine, Ser 1.30 (H) 0.44 - 1.00 mg/dL   Calcium 9.5 8.9 - 10.3 mg/dL   Total Protein 7.9 6.5 - 8.1 g/dL   Albumin 4.0 3.5 - 5.0 g/dL   AST 26 15 - 41 U/L   ALT 23 14 - 54 U/L   Alkaline Phosphatase 172 (H) 38 - 126 U/L   Total Bilirubin 1.4 (H) 0.3 - 1.2 mg/dL   GFR calc non Af Amer 50 (L) >60 mL/min   GFR calc Af Amer 58 (L) >60 mL/min   Anion gap 20 (H) 5 - 15  CBC  Result Value Ref Range   WBC 14.8 (H) 4.0 - 10.5 K/uL   RBC 4.90 3.87 - 5.11 MIL/uL   Hemoglobin 15.8 (H) 12.0 - 15.0 g/dL   HCT 45.5 36.0 - 46.0 %   MCV 92.9 78.0 - 100.0 fL   MCH 32.2 26.0 - 34.0 pg   MCHC 34.7 30.0 - 36.0 g/dL   RDW 12.3 11.5 - 15.5 %   Platelets 344 150 - 400 K/uL  Urinalysis, Routine w reflex microscopic (not at [facility])  Result Value Ref Range   Color, Urine YELLOW YELLOW   APPearance CLEAR CLEAR   Specific Gravity, Urine 1.030 1.005 - 1.030   pH 5.0 5.0 - 8.0   Glucose, UA >1000 (A) NEGATIVE mg/dL   Hgb urine dipstick NEGATIVE NEGATIVE   Bilirubin Urine NEGATIVE NEGATIVE   Ketones, ur 15 (A) NEGATIVE mg/dL   Protein, ur NEGATIVE NEGATIVE mg/dL   Nitrite NEGATIVE NEGATIVE   Leukocytes, UA NEGATIVE NEGATIVE  Pregnancy, urine  Result Value Ref Range   Preg Test, Ur NEGATIVE NEGATIVE  Urine microscopic-add on  Result Value Ref Range   Squamous Epithelial / LPF 0-5 (A) NONE SEEN   WBC, UA 0-5 0 - 5 WBC/hpf   RBC / HPF 0-5 0 - 5 RBC/hpf   Bacteria, UA RARE (A) NONE SEEN  Basic metabolic panel  Result Value Ref Range   Sodium 133 (L) 135 - 145 mmol/L   Potassium 3.6 3.5 - 5.1 mmol/L   Chloride 96 (L) 101 - 111 mmol/L   CO2 24 22 - 32 mmol/L   Glucose, Bld 345 (H)  65 - 99 mg/dL   BUN 15 6 - 20 mg/dL   Creatinine, Ser 1.21 (H) 0.44 - 1.00 mg/dL   Calcium 9.1 8.9 - 10.3 mg/dL   GFR calc non Af Amer 54 (L) >60 mL/min   GFR calc Af Amer >60 >60 mL/min   Anion gap 13 5 - 15  Basic metabolic panel  Result Value Ref Range   Sodium 135 135 - 145 mmol/L    Potassium 3.5 3.5 - 5.1 mmol/L   Chloride 98 (L) 101 - 111 mmol/L   CO2 25 22 - 32 mmol/L   Glucose, Bld 222 (H) 65 - 99 mg/dL   BUN 15 6 - 20 mg/dL   Creatinine, Ser 1.14 (H) 0.44 - 1.00 mg/dL   Calcium 8.9 8.9 - 10.3 mg/dL   GFR calc non Af Amer 58 (L) >60 mL/min   GFR calc Af Amer >60 >60 mL/min   Anion gap 12 5 - 15  Basic metabolic panel  Result Value Ref Range   Sodium 137 135 - 145 mmol/L   Potassium 3.9 3.5 - 5.1 mmol/L   Chloride 98 (L) 101 - 111 mmol/L   CO2 27 22 - 32 mmol/L   Glucose, Bld 220 (H) 65 - 99 mg/dL   BUN 14 6 - 20 mg/dL   Creatinine, Ser 1.35 (H) 0.44 - 1.00 mg/dL   Calcium 9.0 8.9 - 10.3 mg/dL   GFR calc non Af Amer 48 (L) >60 mL/min   GFR calc Af Amer 55 (L) >60 mL/min   Anion gap 12 5 - 15  Basic metabolic panel  Result Value Ref Range   Sodium 136 135 - 145 mmol/L   Potassium 4.1 3.5 - 5.1 mmol/L   Chloride 100 (L) 101 - 111 mmol/L   CO2 25 22 - 32 mmol/L   Glucose, Bld 223 (H) 65 - 99 mg/dL   BUN 14 6 - 20 mg/dL   Creatinine, Ser 1.22 (H) 0.44 - 1.00 mg/dL   Calcium 8.7 (L) 8.9 - 10.3 mg/dL   GFR calc non Af Amer 54 (L) >60 mL/min   GFR calc Af Amer >60 >60 mL/min   Anion gap 11 5 - 15  Procalcitonin - Baseline  Result Value Ref Range   Procalcitonin 1.85 ng/mL  Urine rapid drug screen (hosp performed)not at [facility]  Result Value Ref Range   Opiates NONE DETECTED NONE DETECTED   Cocaine NONE DETECTED NONE DETECTED   Benzodiazepines NONE DETECTED NONE DETECTED   Amphetamines NONE DETECTED NONE DETECTED   Tetrahydrocannabinol NONE DETECTED NONE DETECTED   Barbiturates NONE DETECTED NONE DETECTED  Hemoglobin A1c  Result Value Ref Range   Hgb A1c MFr Bld 9.7 (H) 4.8 - 5.6 %   Mean Plasma Glucose 232 mg/dL  Glucose, capillary  Result Value Ref Range   Glucose-Capillary 203 (H) 65 - 99 mg/dL  Glucose, capillary  Result Value Ref Range   Glucose-Capillary 136 (H) 65 - 99 mg/dL  Glucose, capillary  Result Value Ref Range   Glucose-Capillary  116 (H) 65 - 99 mg/dL  Glucose, capillary  Result Value Ref Range   Glucose-Capillary 132 (H) 65 - 99 mg/dL  Glucose, capillary  Result Value Ref Range   Glucose-Capillary 169 (H) 65 - 99 mg/dL  Glucose, capillary  Result Value Ref Range   Glucose-Capillary 171 (H) 65 - 99 mg/dL  Glucose, capillary  Result Value Ref Range   Glucose-Capillary 188 (H) 65 - 99 mg/dL  Glucose, capillary  Result Value Ref Range   Glucose-Capillary 250 (H) 65 - 99 mg/dL  Glucose, capillary  Result Value Ref Range   Glucose-Capillary 216 (H) 65 - 99 mg/dL  Procalcitonin  Result Value Ref Range   Procalcitonin 1.12 ng/mL  Basic metabolic panel  Result Value Ref Range   Sodium 137 135 - 145 mmol/L   Potassium 4.2 3.5 - 5.1 mmol/L   Chloride 99 (L) 101 - 111 mmol/L   CO2 28 22 - 32 mmol/L   Glucose, Bld 287 (H) 65 - 99 mg/dL   BUN 11 6 - 20 mg/dL   Creatinine, Ser 0.95 0.44 - 1.00 mg/dL   Calcium 8.8 (L) 8.9 - 10.3 mg/dL   GFR calc non Af Amer >60 >60 mL/min   GFR calc Af Amer >60 >60 mL/min   Anion gap 10 5 - 15  CBC  Result Value Ref Range   WBC 8.3 4.0 - 10.5 K/uL   RBC 4.30 3.87 - 5.11 MIL/uL   Hemoglobin 14.1 12.0 - 15.0 g/dL   HCT 40.1 36.0 - 46.0 %   MCV 93.3 78.0 - 100.0 fL   MCH 32.8 26.0 - 34.0 pg   MCHC 35.2 30.0 - 36.0 g/dL   RDW 12.6 11.5 - 15.5 %   Platelets 393 150 - 400 K/uL  Glucose, capillary  Result Value Ref Range   Glucose-Capillary 150 (H) 65 - 99 mg/dL  Glucose, capillary  Result Value Ref Range   Glucose-Capillary 259 (H) 65 - 99 mg/dL  Glucose, capillary  Result Value Ref Range   Glucose-Capillary 246 (H) 65 - 99 mg/dL   Comment 1 Notify RN    Comment 2 Document in Chart   Glucose, capillary  Result Value Ref Range   Glucose-Capillary 113 (H) 65 - 99 mg/dL  CBG monitoring, ED  Result Value Ref Range   Glucose-Capillary >600 (HH) 65 - 99 mg/dL  CBG monitoring, ED  Result Value Ref Range   Glucose-Capillary >600 (HH) 65 - 99 mg/dL   Comment 1 Notify RN      Comment 2 Document in Chart   I-Stat Venous Blood Gas, ED (order at [facility] and [facility] only)  Result Value Ref Range   pH, Ven 7.330 (H) 7.250 - 7.300   pCO2, Ven 46.2 45.0 - 50.0 mmHg   pO2, Ven 19.0 (LL) 30.0 - 45.0 mmHg   Bicarbonate 24.4 (H) 20.0 - 24.0 mEq/L   TCO2 26 0 - 100 mmol/L   O2 Saturation 26.0 %   Acid-base deficit 2.0 0.0 - 2.0 mmol/L   Patient temperature HIDE    Sample type VENOUS    Comment NOTIFIED PHYSICIAN   I-Stat Chem 8, ED  (not at [facility], [facility])  Result Value Ref Range   Sodium 130 (L) 135 - 145 mmol/L   Potassium 4.2 3.5 - 5.1 mmol/L   Chloride 94 (L) 101 - 111 mmol/L   BUN 18 6 - 20 mg/dL   Creatinine, Ser 0.90 0.44 - 1.00 mg/dL   Glucose, Bld 598 (HH) 65 - 99 mg/dL   Calcium, Ion 1.14 1.12 - 1.23 mmol/L   TCO2 23 0 - 100 mmol/L   Hemoglobin 18.0 (H) 12.0 - 15.0 g/dL   HCT 53.0 (H) 36.0 - 46.0 %   Comment NOTIFIED PHYSICIAN   CBG monitoring, ED  Result Value Ref Range   Glucose-Capillary 508 (H) 65 - 99 mg/dL  CBG monitoring, ED  Result Value Ref Range   Glucose-Capillary 394 (H) 65 -  99 mg/dL  CBG monitoring, ED  Result Value Ref Range   Glucose-Capillary 284 (H) 65 - 99 mg/dL  CBG monitoring, ED  Result Value Ref Range   Glucose-Capillary 252 (H) 65 - 99 mg/dL    Results for orders placed or performed in visit on 02/23/15  POCT glycosylated hemoglobin (Hb A1C)  Result Value Ref Range   Hemoglobin A1C 9.3     ASSESSMENT AND PLAN:  45 y.o. year old female with repeat visits to ED related to uncontrolled DM.  Needs outpatient supervision  This chart was scribed in my presence and reviewed by me personally.    ICD-9-CM ICD-10-CM   1. Itching 698.9 L29.9 cyproheptadine (PERIACTIN) 4 MG tablet  2. Type 1 diabetes mellitus with complication (HCC) 123XX123 E10.8 POCT glycosylated hemoglobin (Hb A1C)     BASIC METABOLIC PANEL WITH GFR     Ambulatory referral to Home Health     Signed, [name], MD  Signed, [name],  MD 02/23/2015 12:53 PM

## 2015-02-23 NOTE — Patient Instructions (Signed)
Please return in one month so I can review your lab results and  Adjust diabetes medicines as necessary

## 2015-02-24 LAB — CULTURE, BLOOD (ROUTINE X 2)
Culture: NO GROWTH
Culture: NO GROWTH

## 2015-02-27 ENCOUNTER — Other Ambulatory Visit: Payer: Self-pay

## 2015-02-27 DIAGNOSIS — Z1231 Encounter for screening mammogram for malignant neoplasm of breast: Secondary | ICD-10-CM

## 2015-02-28 ENCOUNTER — Encounter: Payer: Self-pay | Admitting: *Deleted

## 2015-03-01 ENCOUNTER — Ambulatory Visit: Payer: Self-pay

## 2015-04-06 ENCOUNTER — Emergency Department (HOSPITAL_COMMUNITY)
Admission: EM | Admit: 2015-04-06 | Discharge: 2015-04-06 | Disposition: A | Discharge status: Home / Self Care | Payer: BLUE CROSS/BLUE SHIELD | Attending: Emergency Medicine | Admitting: Emergency Medicine

## 2015-04-06 ENCOUNTER — Emergency Department (HOSPITAL_COMMUNITY): Payer: BLUE CROSS/BLUE SHIELD

## 2015-04-06 ENCOUNTER — Encounter (HOSPITAL_COMMUNITY): Payer: Self-pay | Admitting: *Deleted

## 2015-04-06 DIAGNOSIS — Y998 Other external cause status: Secondary | ICD-10-CM | POA: Insufficient documentation

## 2015-04-06 DIAGNOSIS — S99922A Unspecified injury of left foot, initial encounter: Secondary | ICD-10-CM | POA: Diagnosis present

## 2015-04-06 DIAGNOSIS — D849 Immunodeficiency, unspecified: Secondary | ICD-10-CM | POA: Diagnosis not present

## 2015-04-06 DIAGNOSIS — G43909 Migraine, unspecified, not intractable, without status migrainosus: Secondary | ICD-10-CM | POA: Insufficient documentation

## 2015-04-06 DIAGNOSIS — F329 Major depressive disorder, single episode, unspecified: Secondary | ICD-10-CM | POA: Insufficient documentation

## 2015-04-06 DIAGNOSIS — Z8719 Personal history of other diseases of the digestive system: Secondary | ICD-10-CM | POA: Diagnosis not present

## 2015-04-06 DIAGNOSIS — Z87442 Personal history of urinary calculi: Secondary | ICD-10-CM | POA: Diagnosis not present

## 2015-04-06 DIAGNOSIS — E1042 Type 1 diabetes mellitus with diabetic polyneuropathy: Secondary | ICD-10-CM | POA: Insufficient documentation

## 2015-04-06 DIAGNOSIS — E785 Hyperlipidemia, unspecified: Secondary | ICD-10-CM | POA: Insufficient documentation

## 2015-04-06 DIAGNOSIS — Z87828 Personal history of other (healed) physical injury and trauma: Secondary | ICD-10-CM | POA: Diagnosis not present

## 2015-04-06 DIAGNOSIS — Y9389 Activity, other specified: Secondary | ICD-10-CM | POA: Insufficient documentation

## 2015-04-06 DIAGNOSIS — Z86711 Personal history of pulmonary embolism: Secondary | ICD-10-CM | POA: Insufficient documentation

## 2015-04-06 DIAGNOSIS — Z79899 Other long term (current) drug therapy: Secondary | ICD-10-CM | POA: Diagnosis not present

## 2015-04-06 DIAGNOSIS — Z794 Long term (current) use of insulin: Secondary | ICD-10-CM | POA: Diagnosis not present

## 2015-04-06 DIAGNOSIS — S92322K Displaced fracture of second metatarsal bone, left foot, subsequent encounter for fracture with nonunion: Secondary | ICD-10-CM | POA: Insufficient documentation

## 2015-04-06 DIAGNOSIS — F419 Anxiety disorder, unspecified: Secondary | ICD-10-CM | POA: Insufficient documentation

## 2015-04-06 DIAGNOSIS — M797 Fibromyalgia: Secondary | ICD-10-CM | POA: Insufficient documentation

## 2015-04-06 DIAGNOSIS — Y9289 Other specified places as the place of occurrence of the external cause: Secondary | ICD-10-CM | POA: Diagnosis not present

## 2015-04-06 DIAGNOSIS — M1990 Unspecified osteoarthritis, unspecified site: Secondary | ICD-10-CM | POA: Insufficient documentation

## 2015-04-06 DIAGNOSIS — M79672 Pain in left foot: Secondary | ICD-10-CM

## 2015-04-06 DIAGNOSIS — W010XXA Fall on same level from slipping, tripping and stumbling without subsequent striking against object, initial encounter: Secondary | ICD-10-CM | POA: Insufficient documentation

## 2015-04-06 MED ORDER — NAPROXEN 250 MG PO TABS
500.0000 mg | ORAL_TABLET | Freq: Once | ORAL | Status: DC
  Filled 2015-04-06: qty 2

## 2015-04-06 MED ORDER — ACETAMINOPHEN 325 MG PO TABS
650.0000 mg | ORAL_TABLET | Freq: Once | ORAL | Status: AC
  Administered 2015-04-06: 650 mg via ORAL
  Filled 2015-04-06: qty 2

## 2015-04-06 NOTE — ED Provider Notes (Signed)
CSN: [id number]     Arrival date & time 04/06/15  1816 History  By signing my name below, I, Maureen [name], attest that this documentation has been prepared under the direction and in the presence of [employer], PA-C. Electronically Signed: Rayna [name], ED Scribe. 04/06/2015. 7:08 PM.   Chief Complaint  Patient presents with  . Foot Pain   The history is provided by the patient. No language interpreter was used.    HPI Comments: Maureen [name] is a 45 y.o. female with a PMHx including IDDM who presents to the Emergency Department complaining of a fall that occurred last night. She reports slipping on a wet surface and rolling her left ankle. She notes associated, moderate, left superior foot pain that worsens with movement and palpation as well as mild paraesthesia to the 2nd digit of her left foot. Pt confirms being ambulatory. Pt took OTC tylenol with her last dosage ~5 hours ago which provided mild relief of her pain. She denies head trauma, LOC or any other associated symptoms at this time.    Past Medical History  Diagnosis Date  . 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 Fibromyalgia   . Hyperlipidemia   . Abdominal pain   . Nausea & vomiting   . Biliary dyskinesia   . Depression   . Joint pain   . Skin rash     due to medications  . Post traumatic stress disorder (PTSD)   . Sleep trouble   . Major depressive disorder ([place])   . Cervical strain   . Closed head injury 2010  . Renal stones   . Migraine     hx of none recent  . Arthritis   . Hypoglycemia   . Anxiety   . Pulmonary embolism [facility]    Past Surgical History  Procedure Laterality Date  . Lipoma removed  yrs ago  . Knee surgery Left 2012  . Cesarean section  1997, 1999  . Cholecystectomy  02/10/2011    Procedure: LAPAROSCOPIC CHOLECYSTECTOMY WITH INTRAOPERATIVE CHOLANGIOGRAM;  Surgeon: Judieth Keens, DO;  Location: [facility];  Service: General;  Laterality:  N/A;  . Robotic assisted laparoscopic lysis of adhesion N/A 03/04/2012    Procedure: ROBOTIC ASSISTED LAPAROSCOPIC LYSIS OF EXTENSIVE ADHESIONS;  Surgeon: Claiborne Billings A. Pamala Hurry, MD;  Location: [facility];  Service: Gynecology;  Laterality: N/A;  . Tonsillectomy  2001  . Abdominal hysterectomy  2001  . Eye surgery  2006    laser eye surgery left eye  . Exploratory laparomty  Mar 04, 2012  . Laparoscopic appendectomy N/A 10/12/2012    Procedure: DIAGNOSTIC APPENDECTOMY LAPAROSCOPIC;  Surgeon: Madilyn Hook, DO;  Location: [facility];  Service: General;  Laterality: N/A;  . Appendectomy     Family History  Problem Relation Age of Onset  . Cancer Father     lymphoma  . Nephrolithiasis Father   . Vascular Disease Father   . Alcohol abuse Father   . Drug abuse Father   . Cancer Maternal Grandmother     colon  . Hypertension Mother   . Heart disease Mother   . Cataracts Mother   . Depression Mother   . Diabetes Brother   . Drug abuse Brother   . Mental illness Maternal Grandfather   . Cancer Maternal Grandfather   . Heart disease Paternal Grandmother   . Heart disease Paternal Grandfather   . Suicidality Maternal Uncle    Social History  Substance Use  Topics  . Smoking status: Never Smoker   . Smokeless tobacco: Never Used  . Alcohol Use: No   OB History    No data available     Review of Systems  Musculoskeletal: Positive for joint swelling and arthralgias.  Skin: Negative for color change and wound.  Allergic/Immunologic: Positive for immunocompromised state.  Neurological: Negative for syncope and headaches.    Allergies  Cephalexin; Ibuprofen; and Meloxicam  Home Medications   Prior to Admission medications   Medication Sig Start Date End Date Taking? Authorizing Provider  acetaminophen (TYLENOL) 500 MG tablet Take 500 mg by mouth every 6 (six) hours as needed for mild pain. Reported on 01/23/2015    Historical Provider, MD  atorvastatin (LIPITOR) 40 MG tablet Take 1 tablet (40  mg total) by mouth daily. Patient taking differently: Take 40 mg by mouth daily at 6 PM.  12/05/14   Gay Filler Copland, MD  cyclobenzaprine (FLEXERIL) 5 MG tablet Take 5 mg by mouth 3 (three) times daily as needed for muscle spasms.  01/19/15   Historical Provider, MD  cyproheptadine (PERIACTIN) 4 MG tablet Take 1/2-1 pill up to 3 times daily as needed for itching 02/23/15   [name], MD  FLUoxetine (PROZAC) 40 MG capsule Take 40 mg by mouth daily.    Historical Provider, MD  insulin aspart (NOVOLOG) 100 UNIT/ML injection Inject 8 Units into the skin 3 (three) times daily with meals. 10/16/13   Kerrie Buffalo, NP  insulin glargine (LANTUS) 100 UNIT/ML injection Inject 26 Units into the skin at bedtime.     Historical Provider, MD  Oxycodone HCl 10 MG TABS Take 10 mg by mouth every 8 (eight) hours as needed (pain).    Historical Provider, MD  zolpidem (AMBIEN) 5 MG tablet Take 1 tablet (5 mg total) by mouth at bedtime as needed for sleep. Patient not taking: Reported on 02/19/2015 01/23/15   Thao P Le, DO   BP 153/81 mmHg  Pulse 81  Temp(Src) 98.8 F (37.1 C) (Oral)  Resp 18  Ht 5\' 4"  (1.626 m)  Wt 73.936 kg  BMI 27.97 kg/m2  SpO2 98%    Physical Exam  Constitutional: She is oriented to person, place, and time. She appears well-developed and well-nourished.  HENT:  Head: Normocephalic and atraumatic.  Eyes: EOM are normal.  Neck: Normal range of motion.  Cardiovascular: Normal rate.   Pulmonary/Chest: Effort normal. No respiratory distress.  Abdominal: Soft.  Musculoskeletal: Normal range of motion.  Left foot: Difficulty with flexion of toes secondary to pain. Tenderness over the second and third metatarsal. No obvious deformity. No crepitus. No ecchymosis. 2+ DP pulse. No lateral or medial malleoli or or calcaneal pain.  Ambulatory with steady gait.  Neurological: She is alert and oriented to person, place, and time.  Skin: Skin is warm and dry.  Psychiatric: She has a normal  mood and affect.  Nursing note and vitals reviewed.   ED Course  Procedures  DIAGNOSTIC STUDIES: Oxygen Saturation is 98% on RA, normal by my interpretation.    COORDINATION OF CARE: 7:06 PM Discussed next steps with pt. She verbalized understanding and is agreeable with the plan.   Labs Review Labs Reviewed - No data to display  Imaging Review Dg Foot Complete Left  04/06/2015  CLINICAL DATA:  Anterior left foot pain and swelling at the level of the metatarsals following a fall last night. EXAM: LEFT FOOT - COMPLETE 3+ VIEW COMPARISON:  None. FINDINGS: Mild diffuse dorsal soft  tissue swelling. Transverse fracture of the base of the second metatarsal with corticated margins, sclerosis and some bone resorption. There is also dorsal spur formation in that region. No acute fracture or dislocation is seen. Atheromatous arterial calcifications are noted. 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 No acute fracture. Electronically Signed   By: Claudie Revering M.D.   On: 04/06/2015 19:23   I have personally reviewed and evaluated these images as part of my medical decision-making.   EKG Interpretation None      MDM   Final diagnoses:  Left foot pain   Patient X-Ray negative for obvious fracture or dislocation. There is an old fracture at the base of the second metatarsal with nonunion which was discussed with the patient. She was unaware of this. Pt advised to follow up with pcp. Patient Is ambulatory, conservative therapy recommended and discussed. Patient will be discharged home & is agreeable with above plan. Returns precautions discussed. Pt appears safe for discharge.  I personally performed the services described in this documentation, which was scribed in my presence. The recorded information has been reviewed and is accurate.    Ottie Glazier, PA-C 04/06/15 1939  Fredia Sorrow, MD 04/07/15 [phone]

## 2015-04-06 NOTE — ED Notes (Signed)
PT reports falling last night and now LT foot hurts.

## 2015-04-06 NOTE — ED Notes (Signed)
Patient verbalized understanding of discharge instructions and denies any further needs or questions at this time. VS stable. Patient ambulatory with steady gait.  

## 2015-04-07 ENCOUNTER — Ambulatory Visit (INDEPENDENT_AMBULATORY_CARE_PROVIDER_SITE_OTHER): Payer: BLUE CROSS/BLUE SHIELD | Admitting: Internal Medicine

## 2015-04-07 VITALS — BP 138/64 | HR 94 | Temp 99.3°F | Resp 20 | Ht 64.0 in | Wt 169.8 lb

## 2015-04-07 DIAGNOSIS — S92302S Fracture of unspecified metatarsal bone(s), left foot, sequela: Secondary | ICD-10-CM

## 2015-04-07 DIAGNOSIS — E10319 Type 1 diabetes mellitus with unspecified diabetic retinopathy without macular edema: Secondary | ICD-10-CM

## 2015-04-07 DIAGNOSIS — M25572 Pain in left ankle and joints of left foot: Secondary | ICD-10-CM | POA: Diagnosis not present

## 2015-04-07 NOTE — Progress Notes (Signed)
Subjective:  By signing my name below, I, Moises Blood, attest that this documentation has been prepared under the direction and in the presence of Tami Lin, MD. Electronically Signed: Moises Blood, [place]. 04/07/2015 , 5:31 PM .  Patient was seen in Room 11 .   Patient ID: Maureen [name], female    DOB: May 30, 1970, 45 y.o.   MRN: [id number] Chief Complaint  Patient presents with  . Foot Injury    left foot   HPI Maureen [name] is a 45 y.o. female who presents to [facility] complaining of left foot pain due to an injury that occurred 2 nights ago. She was seen in [facility] yesterday. Per that note, she reported slipping on a wet surface and rolling her left ankle. 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 She was advised to go home and take tylenol for relief.   She is still limping with a lot of pain while ambulating today. The area is still swollen.  She has no hx of prior foot injury on this side and was playing B Ball with her kids as recently as 2-3 weeks agoi!!!!  Review of chart shows loss of control of metabolic situation thru lack of coordinated approach--she was on PUMP before and did well til insur quit paying. No clear DM care or pCP  Patient Active Problem List Uncontrolled DM 1 Hx PTE   Diagnosis Date Noted  . Nausea   . Acute kidney injury ([place]) 02/19/2015  . Left flank pain 02/19/2015  . DKA (diabetic ketoacidoses) ([place]) 10/24/2014  . HLD (hyperlipidemia) 10/24/2014  . Hx pulmonary embolism 09/28/2014  . Nausea vomiting and diarrhea 09/03/2014  . Abdominal pain 09/03/2014  . Abnormal LFTs 09/03/2014  . Dizziness 09/03/2014  . Acute pulmonary embolism ([place]) 09/03/2014  . Transaminitis 09/02/2014  . GAD (generalized anxiety disorder) 11/23/2013  . PTSD (post-traumatic stress disorder) 11/23/2013  . Severe major  depression without psychotic features ([place]) 10/10/2013  . Major depression ([place]) 05/22/2013  . Chest pain 05/18/2012  . Endometriosis 12/08/2011  . Arthritis of left knee 12/08/2011  . Diabetic retinopathy ([place]) 12/08/2011  . Fibromyalgia 10/23/2011  . DM type 1 (diabetes mellitus, type 1) ([place]) 10/13/2011    Current outpatient prescriptions:  .  acetaminophen (TYLENOL) 500 MG tablet, Take 500 mg by mouth every 6 (six) hours as needed for mild pain. Reported on 01/23/2015, Disp: , Rfl:  .  insulin aspart (NOVOLOG) 100 UNIT/ML injection, Inject 8 Units into the skin 3 (three) times daily with meals., Disp: 10 mL, Rfl: 11 .  insulin glargine (LANTUS) 100 UNIT/ML injection, Inject 26 Units into the skin at bedtime. , Disp: , Rfl:  .  atorvastatin (LIPITOR) 40 MG tablet, Take 1 tablet (40 mg total) by mouth daily. (Patient not taking: Reported on 04/07/2015), Disp: 90 tablet, Rfl: 3 .  cyclobenzaprine (FLEXERIL) 5 MG tablet, Take 5 mg by mouth 3 (three) times daily as needed for muscle spasms. Reported on 04/07/2015, Disp: , Rfl: 2 .  cyproheptadine (PERIACTIN) 4 MG tablet, Take 1/2-1 pill up to 3 times daily as needed for itching, Disp: 30 tablet, Rfl: 0 .  FLUoxetine (PROZAC) 40 MG capsule, Take 40 mg by mouth daily. Reported on 04/07/2015, Disp: , Rfl:  .  Oxycodone HCl 10 MG TABS, Take 10 mg by mouth every 8 (eight) hours as needed (pain). Reported on  04/07/2015, Disp: , Rfl:  .  zolpidem (AMBIEN) 5 MG tablet, Take 1 tablet (5 mg total) by mouth at bedtime as needed for sleep. (Patient not taking: Reported on 02/19/2015), Disp: 15 tablet, Rfl: 1  Review of Systems  Constitutional: Negative for chills and fatigue.  Gastrointestinal: Negative for nausea, vomiting and diarrhea.  Musculoskeletal: Positive for myalgias, joint swelling, arthralgias and gait problem. Negative for back pain, neck pain and neck stiffness.  Skin: Negative for rash and wound.  Neurological: Negative for weakness and  numbness.      Objective:   Physical Exam  Constitutional: She is oriented to person, place, and time. She appears well-developed and well-nourished. No distress.  HENT:  Head: Normocephalic and atraumatic.  Eyes: EOM are normal. Pupils are equal, round, and reactive to light.  Neck: Neck supple.  Cardiovascular: Normal rate.   Pulmonary/Chest: Effort normal. No respiratory distress.  Musculoskeletal: Normal range of motion.  Tender and swollen over toes 2-3 of her left foot extending along 3rd metatarsal with pain on ROM  Neurological: She is alert and oriented to person, place, and time.  Skin: Skin is warm and dry.  Psychiatric: She has a normal mood and affect. Her behavior is normal.  Nursing note and vitals reviewed.  BP 138/64 mmHg  Pulse 94  Temp(Src) 99.3 F (37.4 C) (Oral)  Resp 20  Ht 5\' 4"  (1.626 m)  Wt 169 lb 12.8 oz (77.021 kg)  BMI 29.13 kg/m2  SpO2 99%   [facility] reading (PRIMARY) by Dr. Laney Pastor : left foot xray: Calcium in the posterior tibia and possible old vs atypical new Lisfranc fracture; no new fracture in the fore foot where so tender    Assessment & Plan:   I have completed the patient encounter in its entirety as documented by the scribe, with editing by me where necessary. Luisfelipe Engelstad P. Laney Pastor, M.D.  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

## 2015-04-07 NOTE — Patient Instructions (Signed)
     IF you received an x-ray today, you will receive an invoice from [facility]. Please contact [facility] at [phone] with questions or concerns regarding your invoice.   IF you received labwork today, you will receive an invoice from [facility]. Please contact [facility] at [phone] with questions or concerns regarding your invoice.   Our billing staff will not be able to assist you with questions regarding bills from these companies.  You will be contacted with the lab results as soon as they are available. The fastest way to get your results is to activate your My Chart account. Instructions are located on the last page of this paperwork. If you have not heard from us regarding the results in 2 weeks, please contact this office.      

## 2015-04-11 ENCOUNTER — Other Ambulatory Visit (HOSPITAL_COMMUNITY): Payer: Self-pay | Admitting: Physical Medicine and Rehabilitation

## 2015-04-11 ENCOUNTER — Ambulatory Visit (HOSPITAL_COMMUNITY)
Admission: RE | Admit: 2015-04-11 | Discharge: 2015-04-11 | Disposition: A | Discharge status: Home / Self Care | Payer: BLUE CROSS/BLUE SHIELD | Source: Ambulatory Visit | Attending: Physical Medicine and Rehabilitation | Admitting: Physical Medicine and Rehabilitation

## 2015-04-11 DIAGNOSIS — M545 Low back pain: Secondary | ICD-10-CM | POA: Insufficient documentation

## 2015-04-12 ENCOUNTER — Telehealth: Payer: Self-pay

## 2015-04-12 NOTE — Telephone Encounter (Signed)
Tramadol is all we can call in--she would need to pick anything else Tram 50-100 qid prn 60 tabs ok to call

## 2015-04-12 NOTE — Telephone Encounter (Signed)
Patent called to request medication for her broken foot, as she is still in severe pain.  Her referral has already been sent, and she is calling [place] to schedule her appointment.  She would like some relief until she has her appointment.  CB#: [phone]

## 2015-04-16 DIAGNOSIS — S93325A Dislocation of tarsometatarsal joint of left foot, initial encounter: Secondary | ICD-10-CM | POA: Diagnosis not present

## 2015-04-16 DIAGNOSIS — E1042 Type 1 diabetes mellitus with diabetic polyneuropathy: Secondary | ICD-10-CM | POA: Diagnosis not present

## 2015-04-16 DIAGNOSIS — S92322A Displaced fracture of second metatarsal bone, left foot, initial encounter for closed fracture: Secondary | ICD-10-CM | POA: Diagnosis not present

## 2015-04-16 NOTE — Telephone Encounter (Signed)
Advised pt

## 2015-04-22 ENCOUNTER — Other Ambulatory Visit (HOSPITAL_COMMUNITY): Payer: Self-pay | Admitting: Psychiatry

## 2015-05-02 ENCOUNTER — Other Ambulatory Visit: Payer: Self-pay | Admitting: Internal Medicine

## 2015-05-02 ENCOUNTER — Other Ambulatory Visit: Payer: Self-pay | Admitting: Family Medicine

## 2015-05-03 NOTE — Telephone Encounter (Signed)
Forwarding to Dr. Joseph Art, listed as her PCP.

## 2015-05-06 NOTE — Telephone Encounter (Signed)
Called in.

## 2015-05-17 IMAGING — CR DG ABDOMEN 1V
1 series · 1 of 1 positions shown · non-contrast
Comparison: May 26, 2012.

CLINICAL DATA: Abdominal pain.

EXAM:
ABDOMEN - 1 VIEW

[t abdomen supine]
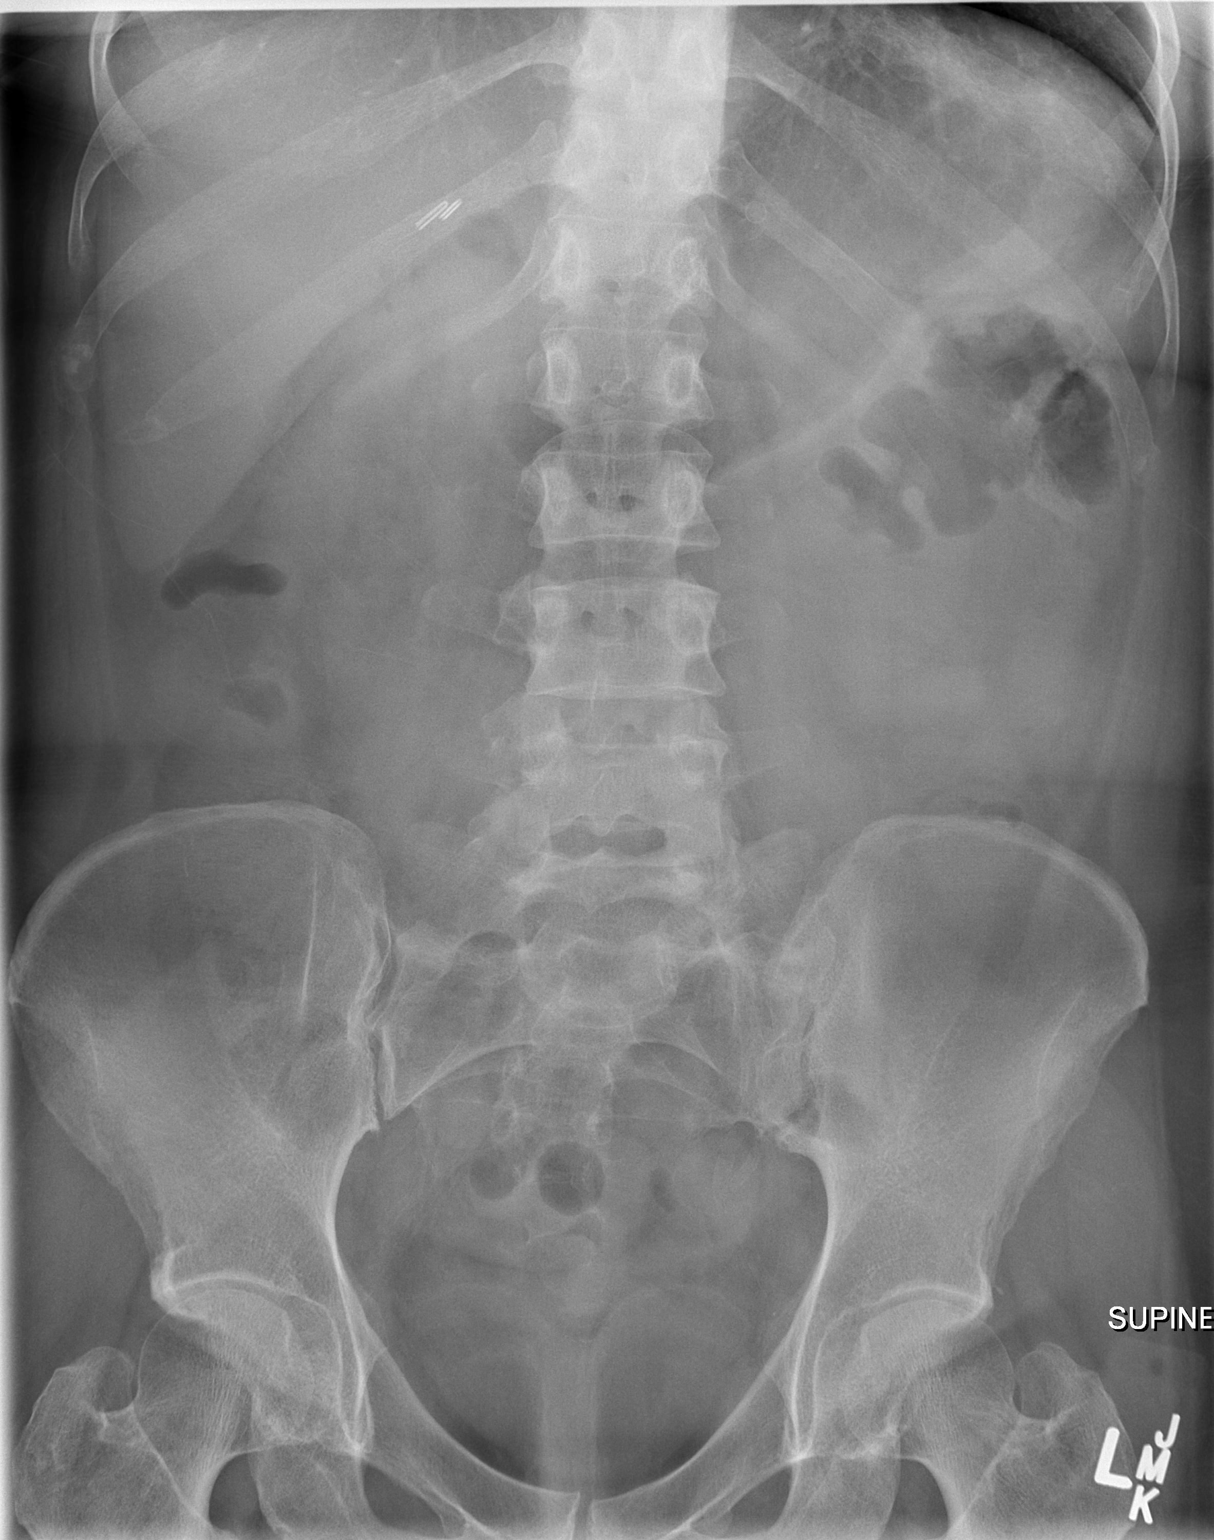

[1 of 1 positions shown; findings below may reference images not displayed]

FINDINGS: The bowel gas pattern is normal. Status post cholecystectomy. No
radio-opaque calculi or other significant radiographic abnormality
are seen.
IMPRESSION: No evidence of bowel obstruction or ileus.

## 2015-05-17 IMAGING — CR DG CHEST 2V
2 series · 2 of 2 positions shown · non-contrast
Comparison: August 23, 2012.

CLINICAL DATA: Chest pain.

EXAM:
CHEST  2 VIEW

[w chest pa]
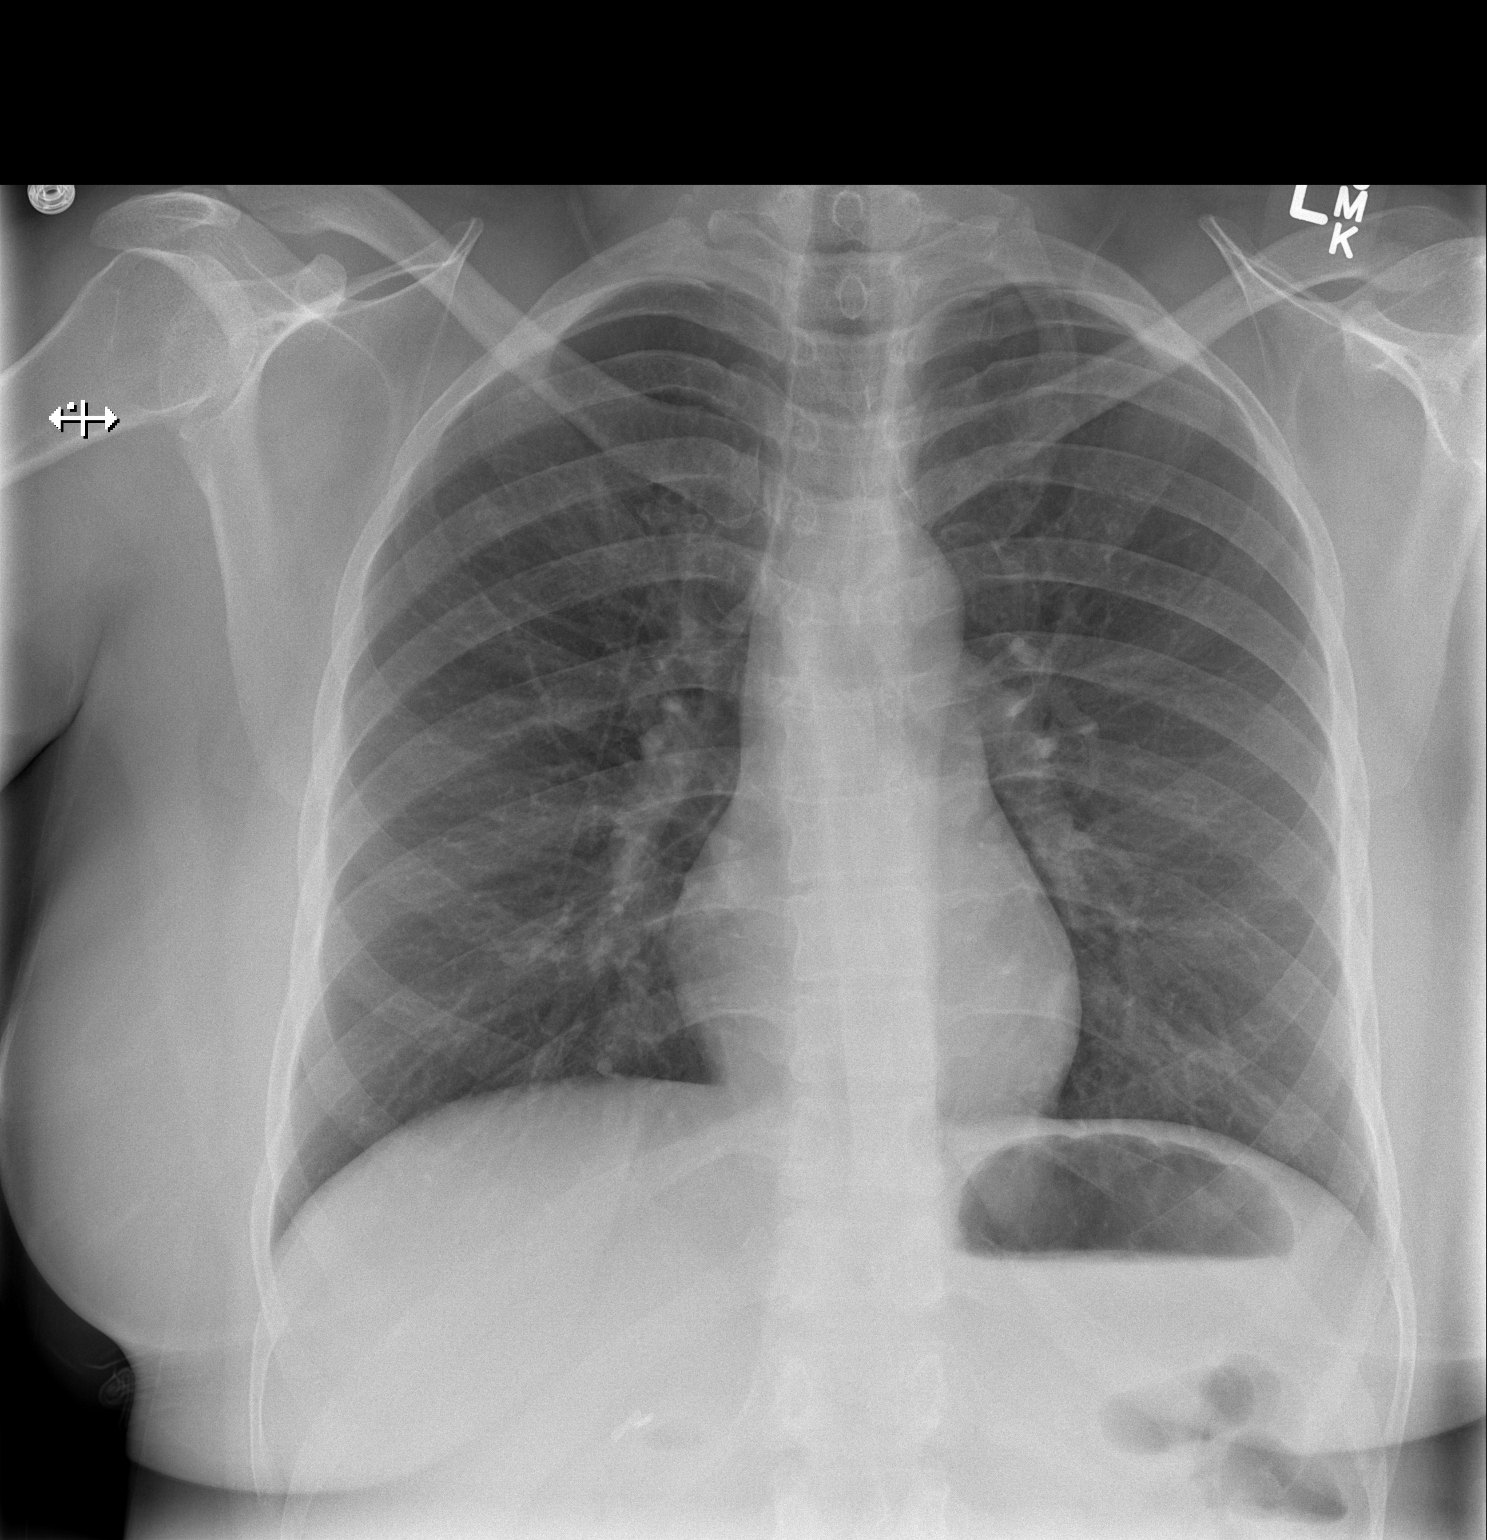

[w chest lat]
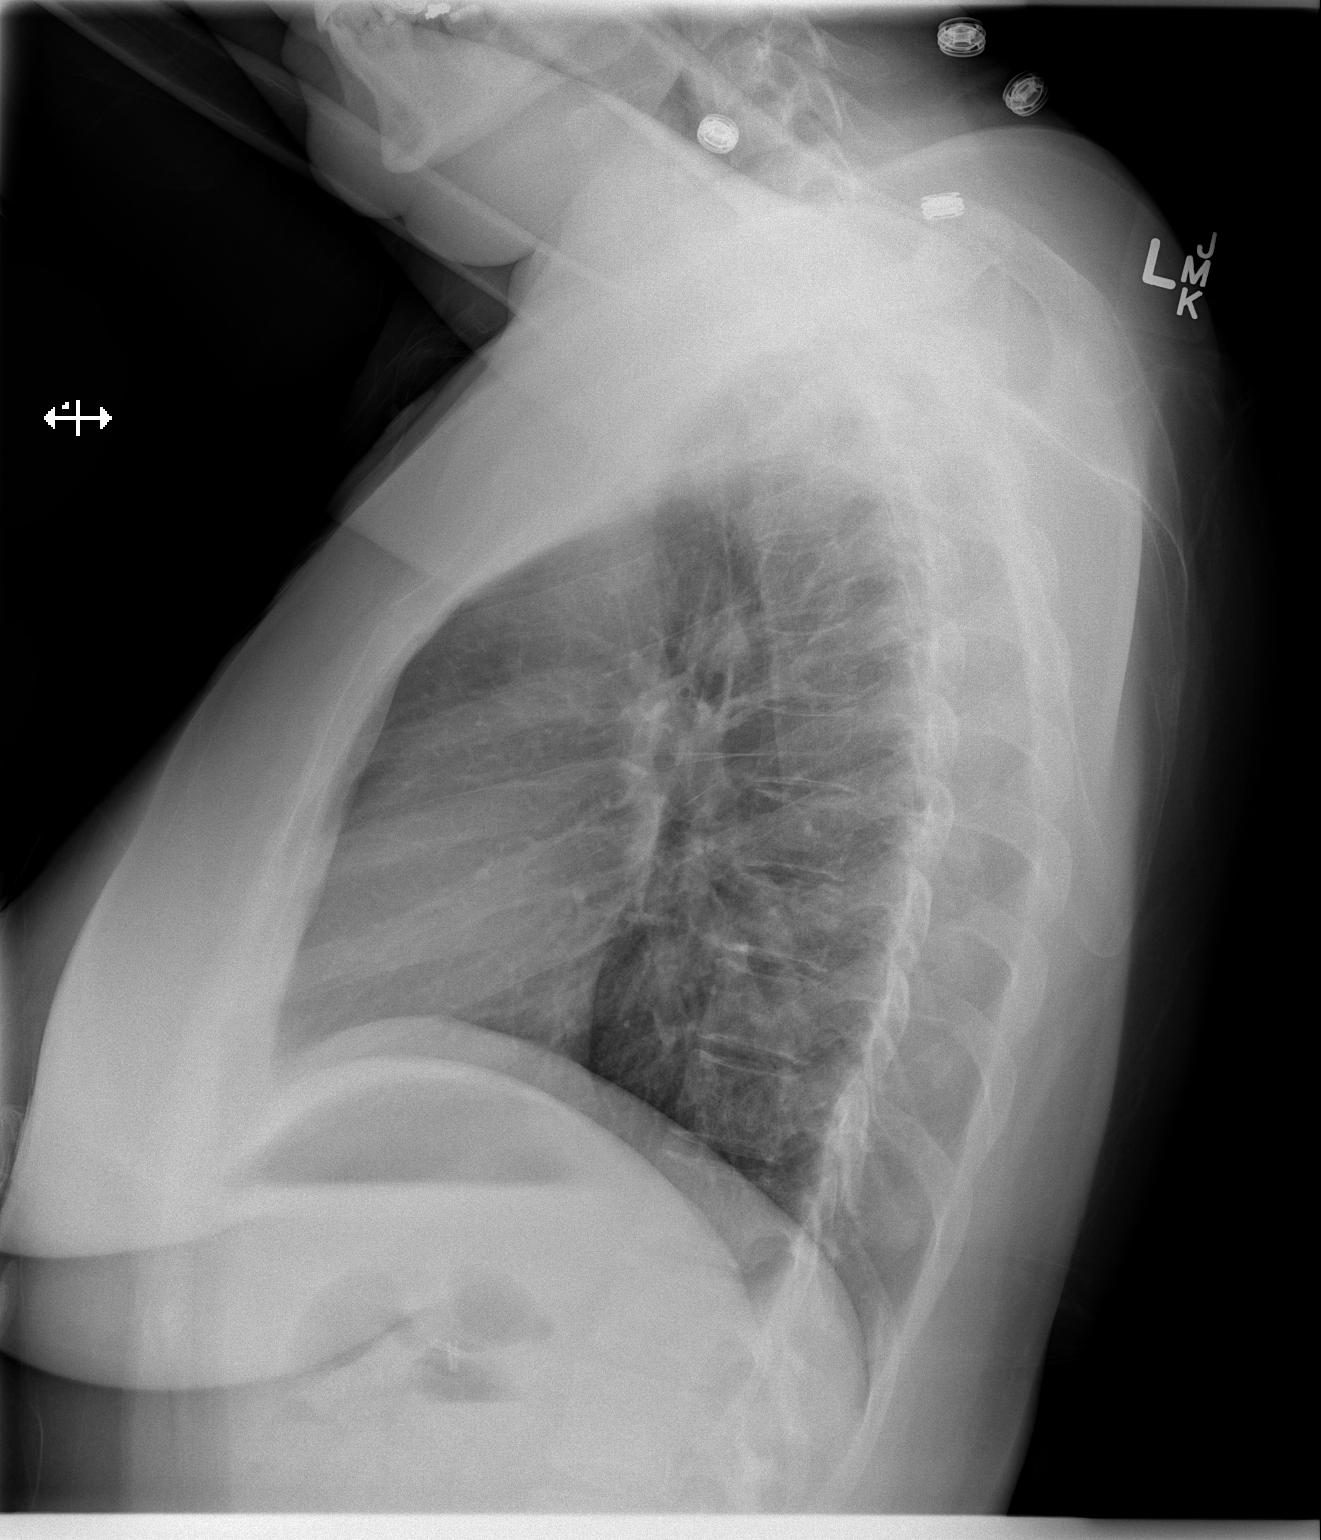

[2 of 2 positions shown; findings below may reference images not displayed]

FINDINGS: The heart size and mediastinal contours are within normal limits.
Both lungs are clear. No pneumothorax or pleural effusion is noted.
The visualized skeletal structures are unremarkable.
IMPRESSION: No acute cardiopulmonary abnormality seen.

## 2015-05-21 IMAGING — CR DG ABDOMEN ACUTE W/ 1V CHEST
3 series · 3 of 3 positions shown · non-contrast
Comparison: 07/14/2013

CLINICAL DATA: Abdominal pain

EXAM:
ACUTE ABDOMEN SERIES (ABDOMEN 2 VIEW & CHEST 1 VIEW)

[PA]
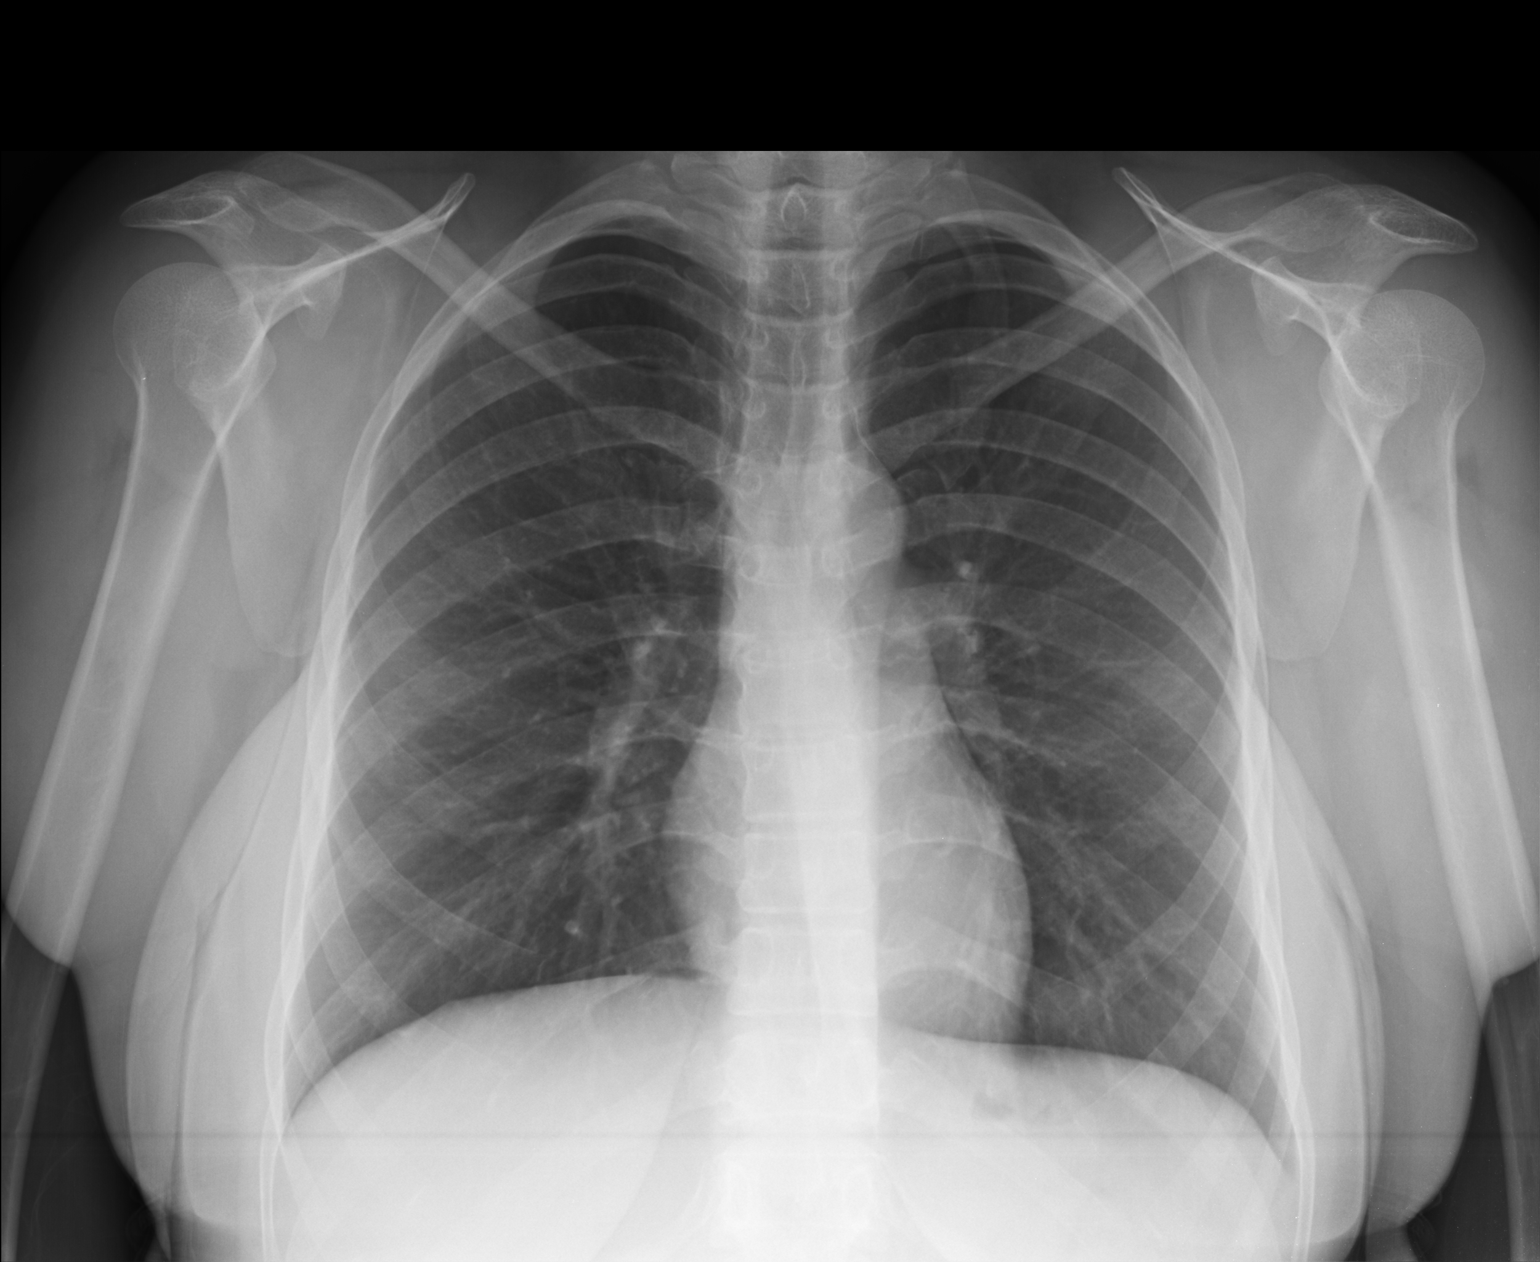

[AP (1 of 2)]
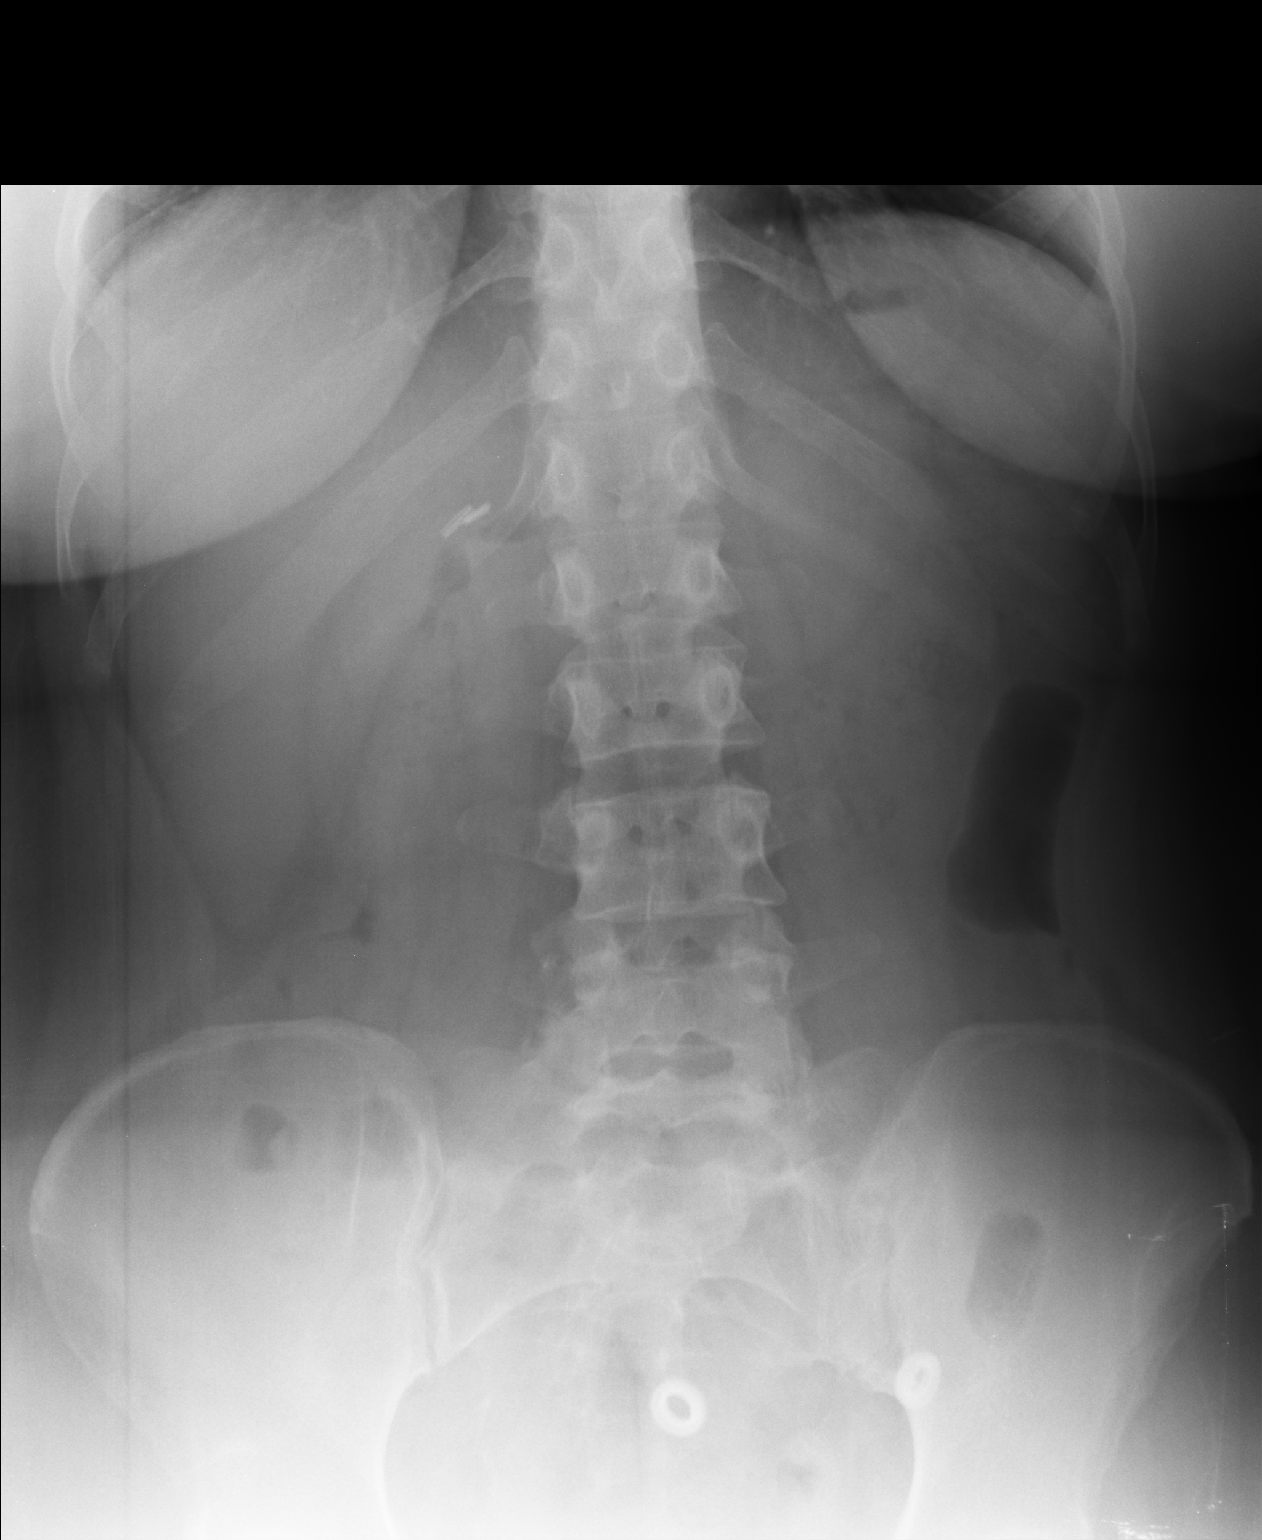

[AP (2 of 2)]
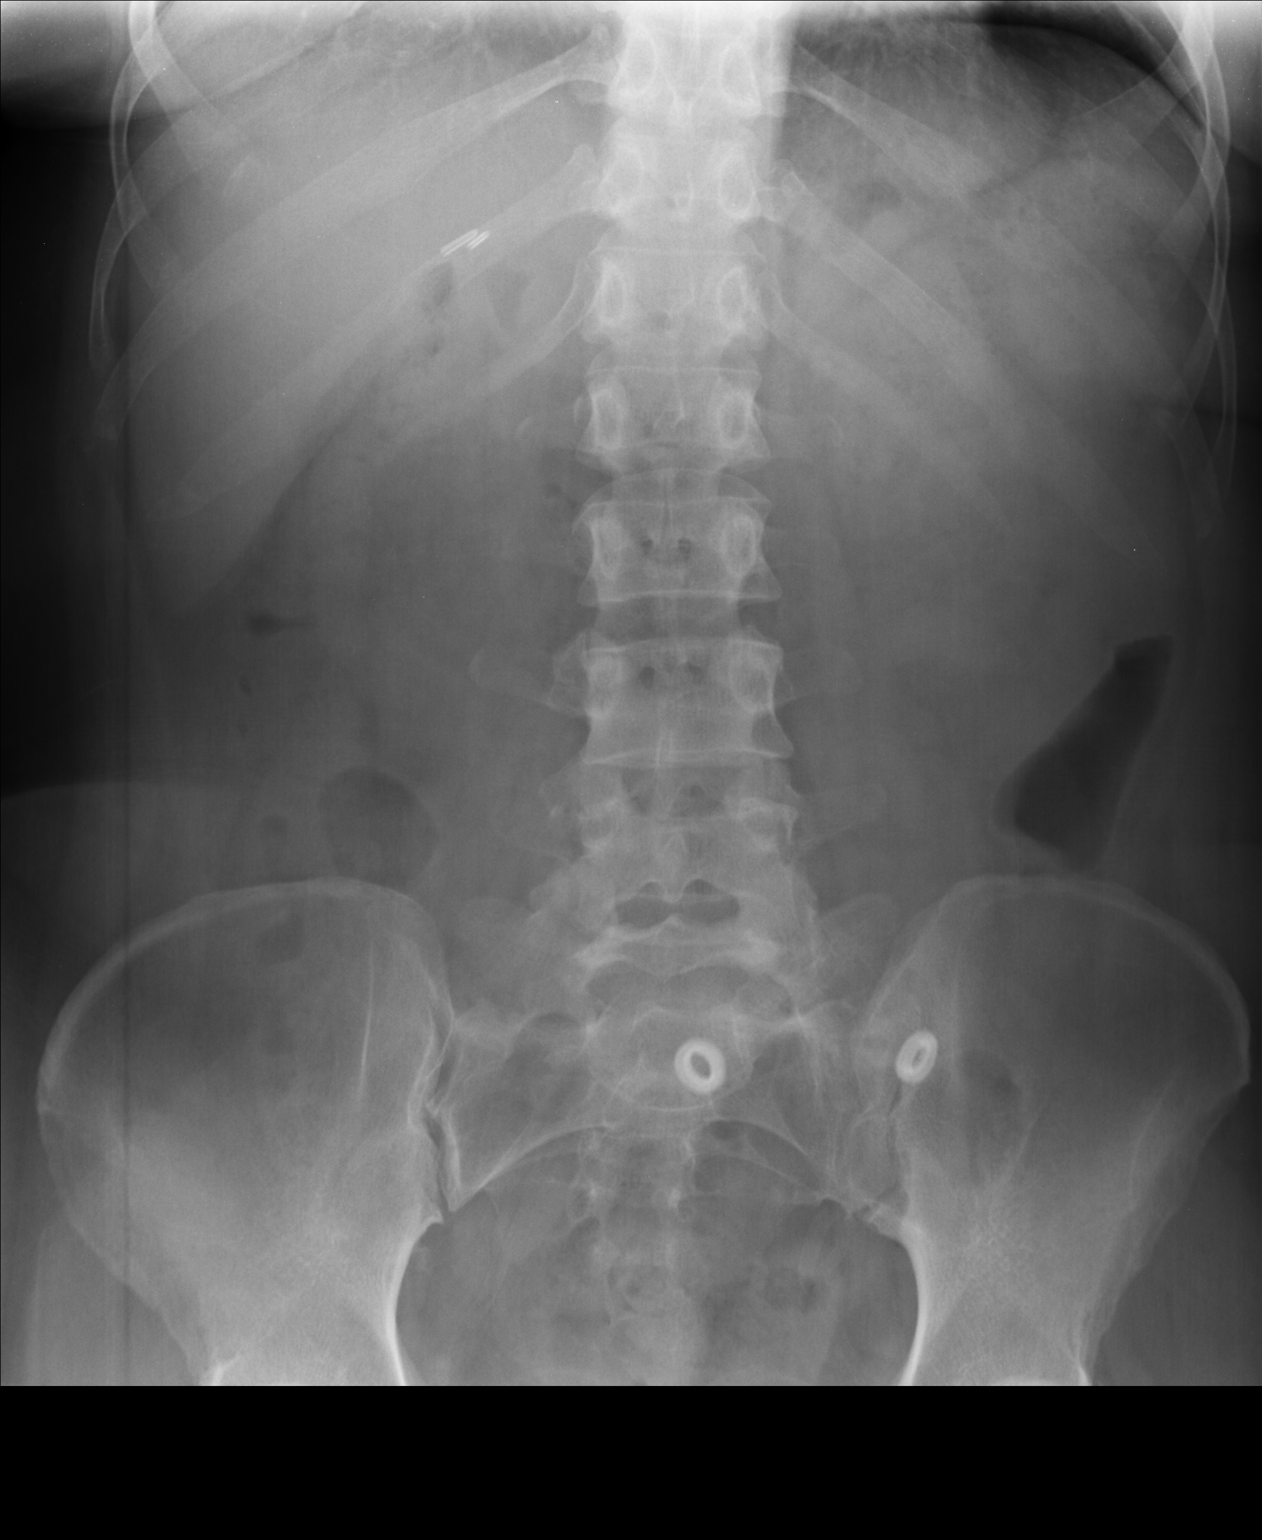

[3 of 3 positions shown; findings below may reference images not displayed]

FINDINGS: There is no evidence of dilated bowel loops or free intraperitoneal
air. No radiopaque calculi or other significant radiographic
abnormality is seen. Previous cholecystectomy. Heart size and
mediastinal contours are within normal limits. Both lungs are clear.
IMPRESSION: 1. Nonobstructive bowel gas pattern.

## 2015-06-07 ENCOUNTER — Ambulatory Visit: Payer: Self-pay | Admitting: Internal Medicine

## 2015-06-19 IMAGING — US US RENAL
1 series · 14 of 25 positions shown · non-contrast
Comparison: CT abdomen and pelvis 12/27/2012

CLINICAL DATA: Flank pain.  History of prior stones.

EXAM:
RENAL/URINARY TRACT ULTRASOUND COMPLETE

[Series 1: us renal · 0.21mm/px · 14 of 33 slices shown]
[im 1/33]
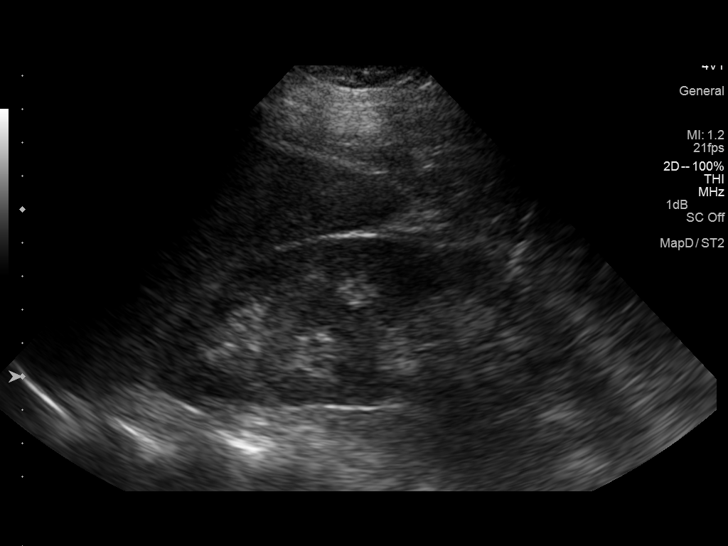
[im 3/33]
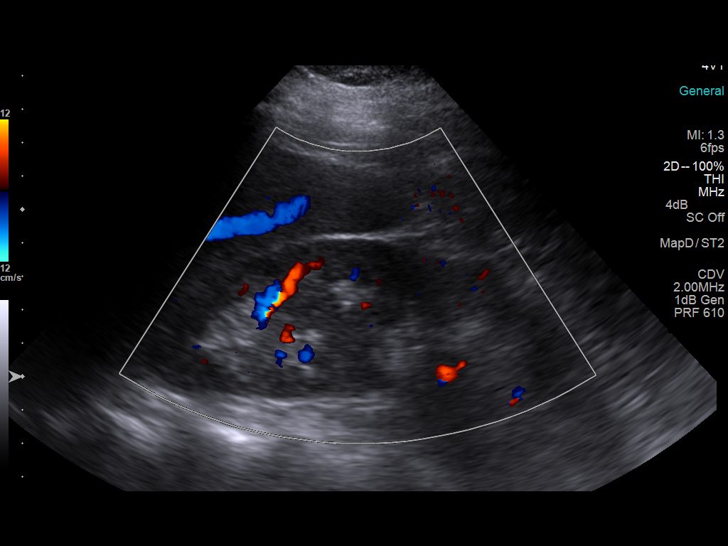
[im 6/33]
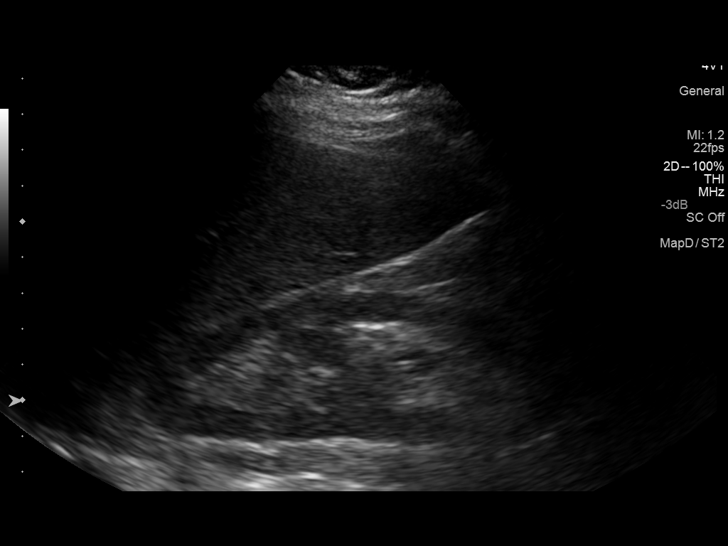
[im 9/33]
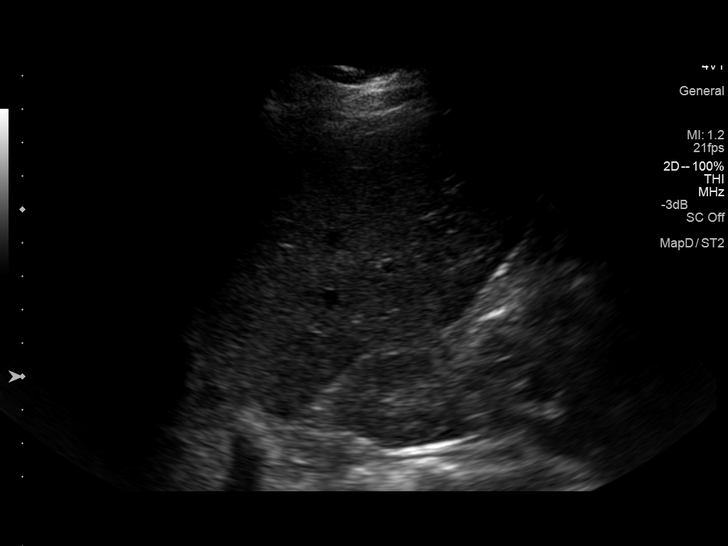
[im 11/33]
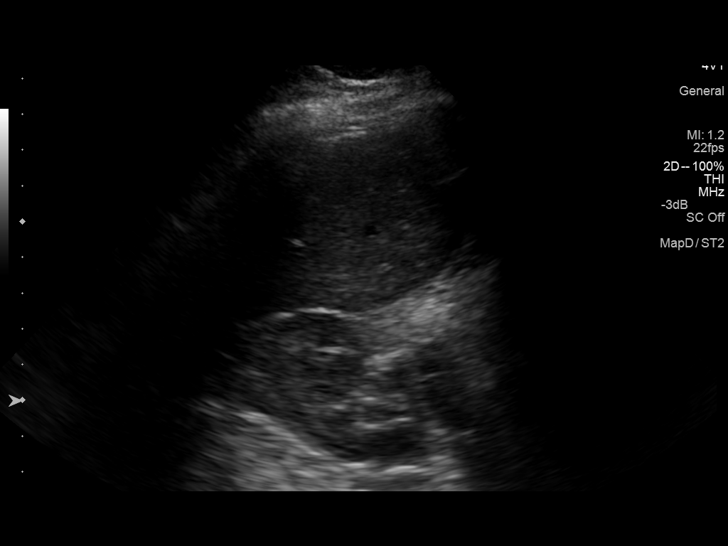
[im 13/33]
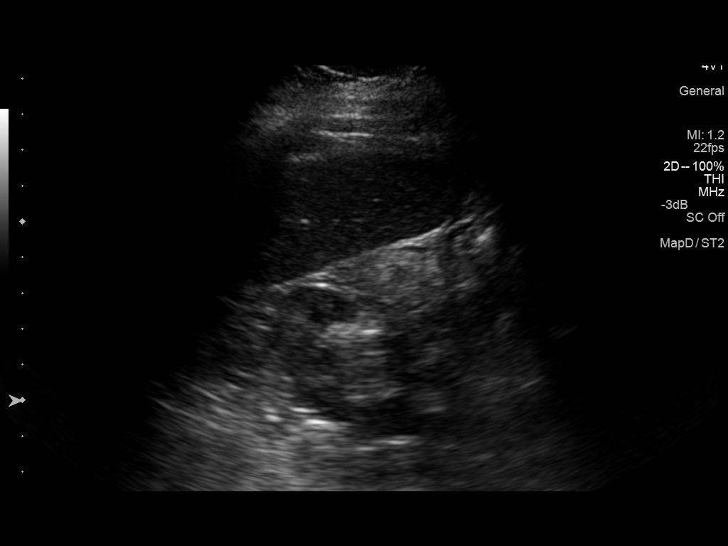
[im 15/33]
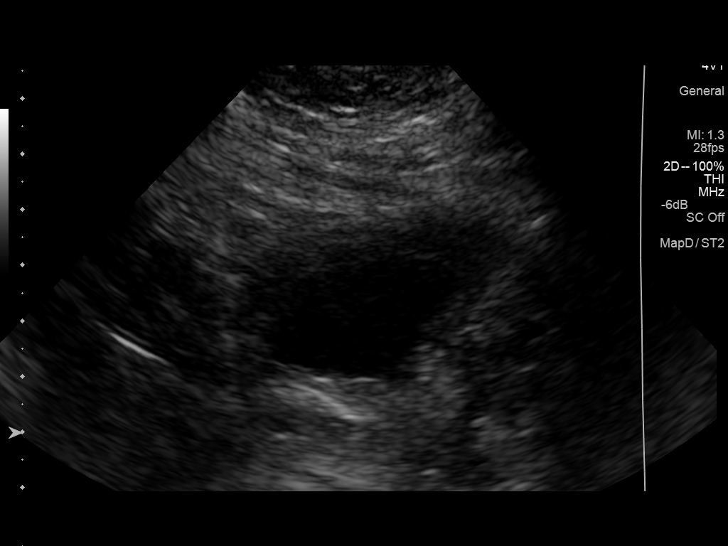
[im 18/33]
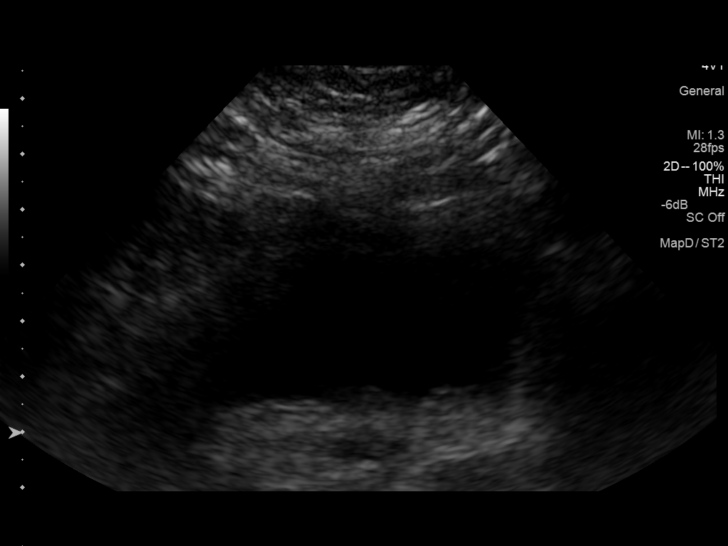
[im 21/33]
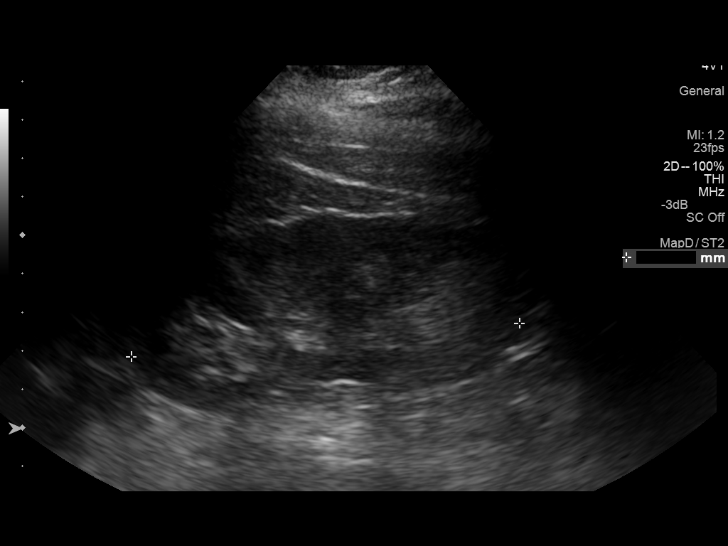
[im 22/33]
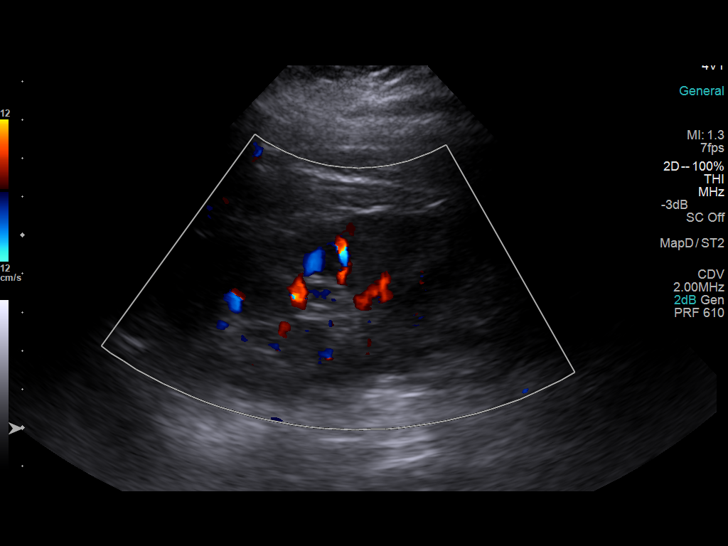
[im 25/33]
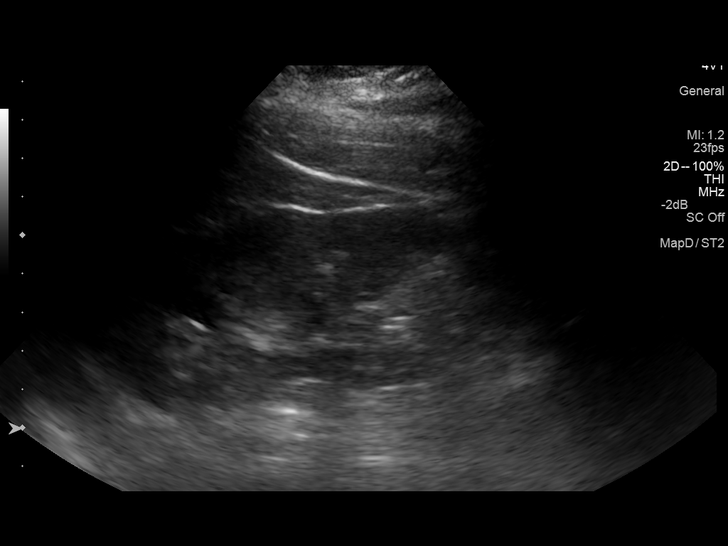
[im 27/33]
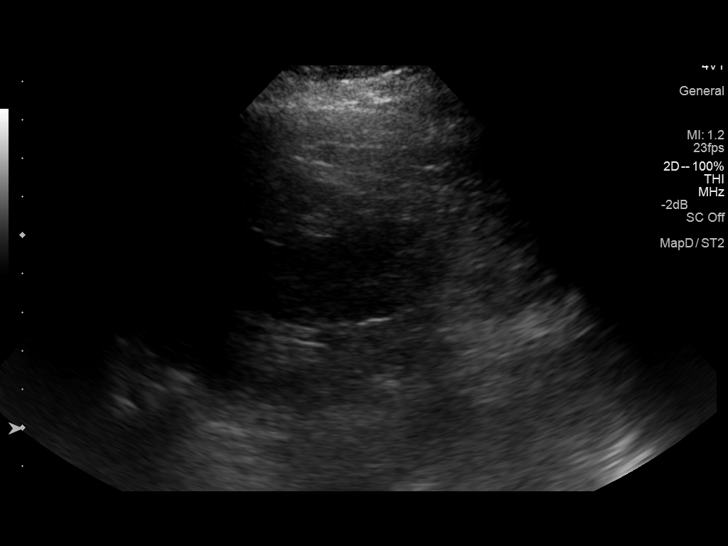
[im 30/33]
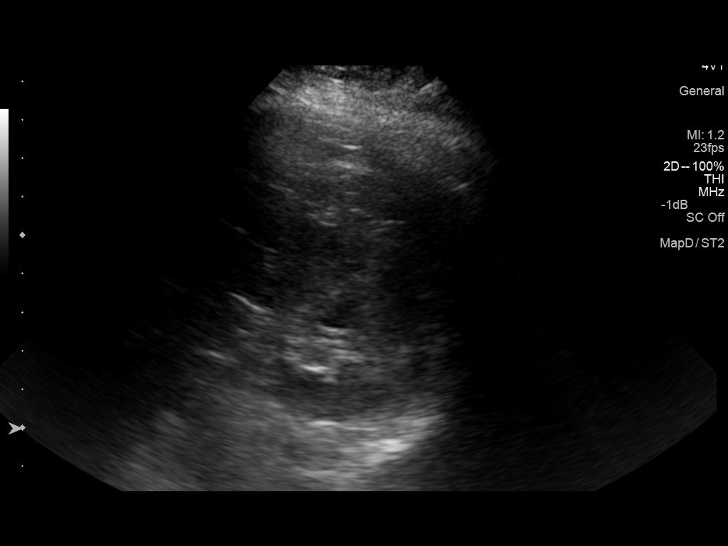
[im 33/33]
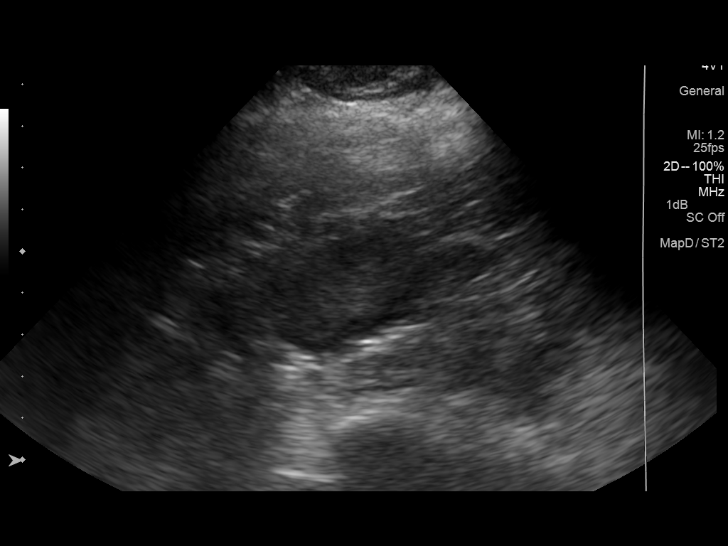

[14 of 25 positions shown; findings below may reference images not displayed]

FINDINGS: Right Kidney:

Length: 12.4 cm. Echogenicity within normal limits. No mass or
hydronephrosis visualized.

Left Kidney:

Length: 10.1 cm. Echogenicity within normal limits. No mass or
hydronephrosis visualized.

Bladder:

Appears normal for degree of bladder distention.
IMPRESSION: Normal ultrasound appearance of the kidneys and bladder. No renal
stones identified.

## 2015-06-26 ENCOUNTER — Emergency Department (HOSPITAL_BASED_OUTPATIENT_CLINIC_OR_DEPARTMENT_OTHER): Payer: BLUE CROSS/BLUE SHIELD

## 2015-06-26 ENCOUNTER — Emergency Department (HOSPITAL_BASED_OUTPATIENT_CLINIC_OR_DEPARTMENT_OTHER)
Admission: EM | Admit: 2015-06-26 | Discharge: 2015-06-27 | Disposition: A | Discharge status: Home / Self Care | Payer: BLUE CROSS/BLUE SHIELD | Attending: Emergency Medicine | Admitting: Emergency Medicine

## 2015-06-26 ENCOUNTER — Encounter (HOSPITAL_BASED_OUTPATIENT_CLINIC_OR_DEPARTMENT_OTHER): Payer: Self-pay

## 2015-06-26 DIAGNOSIS — R42 Dizziness and giddiness: Secondary | ICD-10-CM | POA: Diagnosis not present

## 2015-06-26 DIAGNOSIS — R51 Headache: Secondary | ICD-10-CM | POA: Insufficient documentation

## 2015-06-26 DIAGNOSIS — M1990 Unspecified osteoarthritis, unspecified site: Secondary | ICD-10-CM | POA: Diagnosis not present

## 2015-06-26 DIAGNOSIS — E1042 Type 1 diabetes mellitus with diabetic polyneuropathy: Secondary | ICD-10-CM | POA: Insufficient documentation

## 2015-06-26 DIAGNOSIS — R072 Precordial pain: Secondary | ICD-10-CM | POA: Insufficient documentation

## 2015-06-26 DIAGNOSIS — R0602 Shortness of breath: Secondary | ICD-10-CM | POA: Diagnosis not present

## 2015-06-26 DIAGNOSIS — E785 Hyperlipidemia, unspecified: Secondary | ICD-10-CM | POA: Diagnosis not present

## 2015-06-26 DIAGNOSIS — F329 Major depressive disorder, single episode, unspecified: Secondary | ICD-10-CM | POA: Insufficient documentation

## 2015-06-26 DIAGNOSIS — R05 Cough: Secondary | ICD-10-CM | POA: Insufficient documentation

## 2015-06-26 DIAGNOSIS — R079 Chest pain, unspecified: Secondary | ICD-10-CM

## 2015-06-26 DIAGNOSIS — Z794 Long term (current) use of insulin: Secondary | ICD-10-CM | POA: Insufficient documentation

## 2015-06-26 LAB — BASIC METABOLIC PANEL
Anion gap: 11 (ref 5–15)
BUN: 11 mg/dL (ref 6–20)
CO2: 29 mmol/L (ref 22–32)
Calcium: 10.1 mg/dL (ref 8.9–10.3)
Chloride: 96 mmol/L — ABNORMAL LOW (ref 101–111)
Creatinine, Ser: 1.03 mg/dL — ABNORMAL HIGH (ref 0.44–1.00)
GFR calc Af Amer: >60 mL/min (ref >60)
GFR calc non Af Amer: >60 mL/min (ref >60)
Glucose, Bld: 212 mg/dL — ABNORMAL HIGH (ref 65–99)
Potassium: 3.4 mmol/L — ABNORMAL LOW (ref 3.5–5.1)
Sodium: 136 mmol/L (ref 135–145)

## 2015-06-26 LAB — CBC WITH DIFFERENTIAL/PLATELET
Basophils Absolute: 0.1 10*3/uL (ref 0.0–0.1)
Basophils Relative: 1 %
Eosinophils Absolute: 0.4 10*3/uL (ref 0.0–0.7)
Eosinophils Relative: 3 %
HCT: 42.8 % (ref 36.0–46.0)
Hemoglobin: 14.8 g/dL (ref 12.0–15.0)
Lymphocytes Relative: 13 %
Lymphs Abs: 1.8 10*3/uL (ref 0.7–4.0)
MCH: 31.8 pg (ref 26.0–34.0)
MCHC: 34.6 g/dL (ref 30.0–36.0)
MCV: 91.8 fL (ref 78.0–100.0)
Monocytes Absolute: 0.4 10*3/uL (ref 0.1–1.0)
Monocytes Relative: 3 %
Neutro Abs: 11.1 10*3/uL — ABNORMAL HIGH (ref 1.7–7.7)
Neutrophils Relative %: 80 %
Platelets: 403 10*3/uL — ABNORMAL HIGH (ref 150–400)
RBC: 4.66 MIL/uL (ref 3.87–5.11)
RDW: 12.3 % (ref 11.5–15.5)
WBC: 13.8 10*3/uL — ABNORMAL HIGH (ref 4.0–10.5)

## 2015-06-26 LAB — TROPONIN I
Troponin I: <0.03 ng/mL (ref <0.031)
Troponin I: <0.03 ng/mL (ref <0.031)

## 2015-06-26 LAB — BRAIN NATRIURETIC PEPTIDE: B Natriuretic Peptide: 10.8 pg/mL (ref 0.0–100.0)

## 2015-06-26 MED ORDER — GI COCKTAIL ~~LOC~~
30.0000 mL | Freq: Once | ORAL | Status: AC
  Administered 2015-06-26: 30 mL via ORAL
  Filled 2015-06-26: qty 30

## 2015-06-26 MED ORDER — IOPAMIDOL (ISOVUE-370) INJECTION 76%
100.0000 mL | Freq: Once | INTRAVENOUS | Status: AC | PRN
  Administered 2015-06-26: 100 mL via INTRAVENOUS

## 2015-06-26 NOTE — ED Notes (Signed)
CP x today-denies activity with pain start-states she does have increased stress-NAD-steady gait

## 2015-06-26 NOTE — ED Notes (Signed)
Labs drawn with IV start per Zenaida Niece RN and set to lab pt placed on monitor and EDP at bedside

## 2015-06-26 NOTE — ED Provider Notes (Signed)
CSN: [id number]     Arrival date & time 06/26/15  1845 History  By signing my name below, I, Soijett Blue, attest that this documentation has been prepared under the direction and in the presence of Harvel Quale, MD. Electronically Signed: Soijett Blue, ED Scribe. 06/26/2015. 8:05 PM.   Chief Complaint  Patient presents with  . Chest Pain      The history is provided by the patient. No language interpreter was used.    HPI Comments: Maureen [name] is a 45 y.o. female with a medical hx of PE, DM, hyperlipidemia, who presents to the Emergency Department complaining of constant, sternal, CP onset today at 3:30 PM today. She notes that she was asleep when the CP woke her from her sleep. Pt reports that she felt fine prior to the onset of her symptoms. Pt notes that she has had CP like this in the past. Pt had a PE last year and was taking xarelto that she was taken off of in November 2016. Denies having a DVT in the past. Pt has had a 3D echo, stress test, and EKG in the past with her past CP episodes.   She states that she is having associated symptoms of dizziness, lightheadedness, mild HA, cough worse with laying down x yesterday, and SOB. She states that she has not tried any medications for the relief for her symptoms. She denies leg swelling, leg tenderness, and any other symptoms. Pt states that her blood sugars have been up and down recently. Pt was Rx GERD medications several years ago with no recent issues. Pt mother has CHF and her paternal grandfather had a MI and her father has CHF. Pt has had a hysterectomy of the past.   Past Medical History  Diagnosis Date  . Well controlled type 1 diabetes mellitus with peripheral neuropathy ([place])   . Fibromyalgia   . Hyperlipidemia   . Abdominal pain   . Nausea & vomiting   . Biliary dyskinesia   . Depression   . Joint pain   . Skin rash     due to medications  . Post traumatic stress disorder (PTSD)   . Sleep trouble   . Major  depressive disorder ([place])   . Cervical strain   . Closed head injury 2010  . Renal stones   . Migraine     hx of none recent  . Arthritis   . Hypoglycemia   . Anxiety   . Pulmonary embolism [facility])    Past Surgical History  Procedure Laterality Date  . Lipoma removed  yrs ago  . Knee surgery Left 2012  . Cesarean section  1997, 1999  . Cholecystectomy  02/10/2011    Procedure: LAPAROSCOPIC CHOLECYSTECTOMY WITH INTRAOPERATIVE CHOLANGIOGRAM;  Surgeon: Judieth Keens, DO;  Location: [place];  Service: General;  Laterality: N/A;  . Robotic assisted laparoscopic lysis of adhesion N/A 03/04/2012    Procedure: ROBOTIC ASSISTED LAPAROSCOPIC LYSIS OF EXTENSIVE ADHESIONS;  Surgeon: Claiborne Billings A. Pamala Hurry, MD;  Location: [facility];  Service: Gynecology;  Laterality: N/A;  . Tonsillectomy  2001  . Abdominal hysterectomy  2001  . Eye surgery  2006    laser eye surgery left eye  . Exploratory laparomty  Mar 04, 2012  . Laparoscopic appendectomy N/A 10/12/2012    Procedure: DIAGNOSTIC APPENDECTOMY LAPAROSCOPIC;  Surgeon: Madilyn Hook, DO;  Location: [facility];  Service: General;  Laterality: N/A;  . Appendectomy     Family History  Problem Relation Age of  Onset  . Cancer Father     lymphoma  . Nephrolithiasis Father   . Vascular Disease Father   . Alcohol abuse Father   . Drug abuse Father   . Cancer Maternal Grandmother     colon  . Hypertension Mother   . Heart disease Mother   . Cataracts Mother   . Depression Mother   . Diabetes Brother   . Drug abuse Brother   . Mental illness Maternal Grandfather   . Cancer Maternal Grandfather   . Heart disease Paternal Grandmother   . Heart disease Paternal Grandfather   . Suicidality Maternal Uncle    Social History  Substance Use Topics  . Smoking status: Never Smoker   . Smokeless tobacco: Never Used  . Alcohol Use: No   OB History    No data available     Review of Systems  Respiratory: Positive for cough and shortness of breath.    Cardiovascular: Positive for chest pain. Negative for leg swelling.  Musculoskeletal: Negative for arthralgias.  Neurological: Positive for dizziness, light-headedness and headaches.  All other systems reviewed and are negative.   Allergies  Cephalexin; Ibuprofen; and Meloxicam  Home Medications   Prior to Admission medications   Medication Sig Start Date End Date Taking? Authorizing Provider  acetaminophen (TYLENOL) 500 MG tablet Take 500 mg by mouth every 6 (six) hours as needed for mild pain. Reported on 01/23/2015    Historical Provider, MD  insulin aspart (NOVOLOG) 100 UNIT/ML injection Inject 8 Units into the skin 3 (three) times daily with meals. 10/16/13   Kerrie Buffalo, NP  insulin glargine (LANTUS) 100 UNIT/ML injection Inject 26 Units into the skin at bedtime.     Historical Provider, MD  LANTUS SOLOSTAR 100 UNIT/ML Solostar Pen INJECT 25 UNITS SUBCUTANEOUSLY ONCE DAILY 03/24/15   Historical Provider, MD   BP 140/83 mmHg  Pulse 88  Temp(Src) 99.2 F (37.3 C) (Oral)  Resp 18  Ht 5\' 4"  (1.626 m)  Wt 165 lb (74.844 kg)  BMI 28.31 kg/m2  SpO2 100% Physical Exam  Constitutional: She is oriented to person, place, and time. She appears well-developed and well-nourished. No distress.  HENT:  Head: Normocephalic and atraumatic.  Eyes: EOM are normal.  Neck: Neck supple.  Cardiovascular: Regular rhythm, normal heart sounds and intact distal pulses.  Tachycardia present.  Exam reveals no gallop and no friction rub.   No murmur heard. Pulmonary/Chest: Effort normal and breath sounds normal. No respiratory distress. She has no wheezes. She has no rales.  Abdominal: Soft. She exhibits no distension. There is no tenderness.  Musculoskeletal: Normal range of motion. She exhibits no edema.  Mild calf tenderness. No LE edema  Neurological: She is alert and oriented to person, place, and time.  Skin: Skin is warm and dry.  Psychiatric: She has a normal mood and affect. Her behavior is  normal.  Nursing note and vitals reviewed.   ED Course  Procedures (including critical care time) DIAGNOSTIC STUDIES: Oxygen Saturation is 100% on RA, nl by my interpretation.    COORDINATION OF CARE: 8:01 PM Discussed treatment plan with pt at bedside which includes CXR, EKG, and labs, and pt agreed to plan.    Labs Review Labs Reviewed  CBC WITH DIFFERENTIAL/PLATELET - Abnormal; Notable for the following:    WBC 13.8 (*)    Platelets 403 (*)    Neutro Abs 11.1 (*)    All other components within normal limits  BASIC METABOLIC PANEL - Abnormal;  Notable for the following:    Potassium 3.4 (*)    Chloride 96 (*)    Glucose, Bld 212 (*)    Creatinine, Ser 1.03 (*)    All other components within normal limits  BRAIN NATRIURETIC PEPTIDE  TROPONIN I  TROPONIN I    Imaging Review Dg Chest 2 View  06/26/2015  CLINICAL DATA:  Chest pain, dizziness, lightheadedness and near-syncope today. EXAM: CHEST  2 VIEW COMPARISON:  CT chest 01/19/2015.  PA and lateral chest 01/18/2015. FINDINGS: The lungs are clear. Heart size is normal. No pneumothorax or pleural effusion. IMPRESSION: Normal chest. Electronically Signed   By: Inge Rise M.D.   On: 06/26/2015 21:08   Ct Angio Chest Pe W/cm &/or Wo Cm  06/26/2015  CLINICAL DATA:  Mid chest pain and concern for pulmonary embolism. Difficulty breathing. History of prior pulmonary embolism. EXAM: CT ANGIOGRAPHY CHEST WITH CONTRAST TECHNIQUE: Multidetector CT imaging of the chest was performed using the standard protocol during bolus administration of intravenous contrast. Multiplanar CT image reconstructions and MIPs were obtained to evaluate the vascular anatomy. CONTRAST:  100 cc Isovue 370 intravenous COMPARISON:  01/19/2015 FINDINGS: THORACIC INLET/BODY WALL: No acute abnormality. MEDIASTINUM: Normal heart size. No pericardial effusion. No evidence of acute pulmonary embolism. Possibility of a web from previous embolism in the left lower lobe,  6:143. Normal thoracic aorta. No adenopathy. LUNG WINDOWS: There is no edema, consolidation, effusion, or pneumothorax. UPPER ABDOMEN: Negative. OSSEOUS: No acute fracture.  No suspicious lytic or blastic lesions. Review of the MIP images confirms the above findings. IMPRESSION: No evidence of acute pulmonary embolism. No explanation for chest pain. Electronically Signed   By: Monte Fantasia M.D.   On: 06/26/2015 21:56   I have personally reviewed and evaluated these images and lab results as part of my medical decision-making.   EKG Interpretation   Date/Time:  Wednesday June 26 2015 18:52:12 EDT Ventricular Rate:  110 PR Interval:  118 QRS Duration: 80 QT Interval:  328 QTC Calculation: 443 R Axis:   92 Text Interpretation:  Sinus tachycardia Rightward axis Cannot rule out  Anterior infarct , age undetermined Abnormal ECG Rate increased from  previous tracing Confirmed by NGUYEN, EMILY ([id number]) on 06/26/2015 7:33:42 PM      MDM  Patient was seen and evaluated in stable condition. Patient mildly tachycardic but otherwise with a benign examination. Laboratory studies with leukocytosis but otherwise unremarkable. Chest x-ray unremarkable. CTA negative for acute process including PE. Troponin negative 2. EKG with increased rate but no significant change from prior. Patient said pain improved on its own but never resolved. At this time results are reassuring. Patient has had extensive workup for chest pain in the past including a stress test without acute findings just within the last year. At this time the patient appears stable for discharge. She said she can follow up closely with her primary care physician's office. She was given strict return precautions. Final diagnoses:  Chest pain, unspecified chest pain type    1. Chest pain  I personally performed the services described in this documentation, which was scribed in my presence. The recorded information has been reviewed and is  accurate.   Harvel Quale, MD 06/27/15 [phone]

## 2015-06-27 MED ORDER — MORPHINE SULFATE (PF) 4 MG/ML IV SOLN
4.0000 mg | Freq: Once | INTRAVENOUS | Status: AC
  Administered 2015-06-27: 4 mg via INTRAVENOUS
  Filled 2015-06-27: qty 1

## 2015-06-27 NOTE — Discharge Instructions (Signed)
You were seen and evaluated today for your chest pain.  The exact cause is not exactly clear at this time but your results today have been reassuring.  Please, follow up with your primary care physician tomorrow for reevaluation and continued work up.  Return with sudden worsening symptoms, severe sweating, shortness of breath.  Nonspecific Chest Pain  Chest pain can be caused by many different conditions. There is always a chance that your pain could be related to something serious, such as a heart attack or a blood clot in your lungs. Chest pain can also be caused by conditions that are not life-threatening. If you have chest pain, it is very important to follow up with your health care provider. CAUSES  Chest pain can be caused by:  Heartburn.  Pneumonia or bronchitis.  Anxiety or stress.  Inflammation around your heart (pericarditis) or lung (pleuritis or pleurisy).  A blood clot in your lung.  A collapsed lung (pneumothorax). It can develop suddenly on its own (spontaneous pneumothorax) or from trauma to the chest.  Shingles infection (varicella-zoster virus).  Heart attack.  Damage to the bones, muscles, and cartilage that make up your chest wall. This can include:  Bruised bones due to injury.  Strained muscles or cartilage due to frequent or repeated coughing or overwork.  Fracture to one or more ribs.  Sore cartilage due to inflammation (costochondritis). RISK FACTORS  Risk factors for chest pain may include:  Activities that increase your risk for trauma or injury to your chest.  Respiratory infections or conditions that cause frequent coughing.  Medical conditions or overeating that can cause heartburn.  Heart disease or family history of heart disease.  Conditions or health behaviors that increase your risk of developing a blood clot.  Having had chicken pox (varicella zoster). SIGNS AND SYMPTOMS Chest pain can feel like:  Burning or tingling on the  surface of your chest or deep in your chest.  Crushing, pressure, aching, or squeezing pain.  Dull or sharp pain that is worse when you move, cough, or take a deep breath.  Pain that is also felt in your back, neck, shoulder, or arm, or pain that spreads to any of these areas. Your chest pain may come and go, or it may stay constant. DIAGNOSIS Lab tests or other studies may be needed to find the cause of your pain. Your health care provider may have you take a test called an ambulatory ECG (electrocardiogram). An ECG records your heartbeat patterns at the time the test is performed. You may also have other tests, such as:  Transthoracic echocardiogram (TTE). During echocardiography, sound waves are used to create a picture of all of the heart structures and to look at how blood flows through your heart.  Transesophageal echocardiogram (TEE).This is a more advanced imaging test that obtains images from inside your body. It allows your health care provider to see your heart in finer detail.  Cardiac monitoring. This allows your health care provider to monitor your heart rate and rhythm in real time.  Holter monitor. This is a portable device that records your heartbeat and can help to diagnose abnormal heartbeats. It allows your health care provider to track your heart activity for several days, if needed.  Stress tests. These can be done through exercise or by taking medicine that makes your heart beat more quickly.  Blood tests.  Imaging tests. TREATMENT  Your treatment depends on what is causing your chest pain. Treatment may include:  Medicines.  These may include:  Acid blockers for heartburn.  Anti-inflammatory medicine.  Pain medicine for inflammatory conditions.  Antibiotic medicine, if an infection is present.  Medicines to dissolve blood clots.  Medicines to treat coronary artery disease.  Supportive care for conditions that do not require medicines. This may  include:  Resting.  Applying heat or cold packs to injured areas.  Limiting activities until pain decreases. HOME CARE INSTRUCTIONS  If you were prescribed an antibiotic medicine, finish it all even if you start to feel better.  Avoid any activities that bring on chest pain.  Do not use any tobacco products, including cigarettes, chewing tobacco, or electronic cigarettes. If you need help quitting, ask your health care provider.  Do not drink alcohol.  Take medicines only as directed by your health care provider.  Keep all follow-up visits as directed by your health care provider. This is important. This includes any further testing if your chest pain does not go away.  If heartburn is the cause for your chest pain, you may be told to keep your head raised (elevated) while sleeping. This reduces the chance that acid will go from your stomach into your esophagus.  Make lifestyle changes as directed by your health care provider. These may include:  Getting regular exercise. Ask your health care provider to suggest some activities that are safe for you.  Eating a heart-healthy diet. A registered dietitian can help you to learn healthy eating options.  Maintaining a healthy weight.  Managing diabetes, if necessary.  Reducing stress. SEEK MEDICAL CARE IF:  Your chest pain does not go away after treatment.  You have a rash with blisters on your chest.  You have a fever. SEEK IMMEDIATE MEDICAL CARE IF:   Your chest pain is worse.  You have an increasing cough, or you cough up blood.  You have severe abdominal pain.  You have severe weakness.  You faint.  You have chills.  You have sudden, unexplained chest discomfort.  You have sudden, unexplained discomfort in your arms, back, neck, or jaw.  You have shortness of breath at any time.  You suddenly start to sweat, or your skin gets clammy.  You feel nauseous or you vomit.  You suddenly feel light-headed or  dizzy.  Your heart begins to beat quickly, or it feels like it is skipping beats. These symptoms may represent a serious problem that is an emergency. Do not wait to see if the symptoms will go away. Get medical help right away. Call your local emergency services (911 in the [place] Do not drive yourself to the hospital.   This information is not intended to replace advice given to you by your health care provider. Make sure you discuss any questions you have with your health care provider.   Document Released: 10/15/2004 Document Revised: 01/26/2014 Document Reviewed: 08/11/2013 Elsevier Interactive Patient Education [employer].

## 2015-06-28 ENCOUNTER — Ambulatory Visit (INDEPENDENT_AMBULATORY_CARE_PROVIDER_SITE_OTHER): Payer: BLUE CROSS/BLUE SHIELD | Admitting: Family Medicine

## 2015-06-28 VITALS — BP 114/76 | HR 111 | Temp 99.4°F | Resp 16 | Ht 65.0 in | Wt 168.0 lb

## 2015-06-28 DIAGNOSIS — E785 Hyperlipidemia, unspecified: Secondary | ICD-10-CM | POA: Diagnosis not present

## 2015-06-28 DIAGNOSIS — R079 Chest pain, unspecified: Secondary | ICD-10-CM

## 2015-06-28 DIAGNOSIS — G4700 Insomnia, unspecified: Secondary | ICD-10-CM

## 2015-06-28 MED ORDER — RANITIDINE HCL 150 MG PO TABS: 150.0000 mg | ORAL_TABLET | Freq: Two times a day (BID) | ORAL | Status: DC

## 2015-06-28 MED ORDER — ATORVASTATIN CALCIUM 40 MG PO TABS: 40.0000 mg | ORAL_TABLET | Freq: Every day | ORAL | Status: DC

## 2015-06-28 MED ORDER — CLONAZEPAM 0.5 MG PO TABS: 0.5000 mg | ORAL_TABLET | Freq: Every evening | ORAL | Status: DC | PRN

## 2015-06-28 NOTE — Progress Notes (Signed)
Subjective:    Patient ID: ISRAEL CULL, female    DOB: [birth year], 44 y.o.   MRN: [id number]  HPI This is a 45 yo female who presents for follow up of ED visit 06/26/15 for chest pain. Work up was negative. She had some relief with GI cocktail and morphine initially. Her chest still hurts. She feels like she is being kicked from the inside of her chest. Pain was constant, now comes and goes. Will last for "awhile," and is about 7/10. Felt dizzy two days ago, no dizziness now. Was nauseous, none now. She has been off her lipitor for a couple of weeks and needs a refill. Moved into a new apartment about a month ago and is getting used to going up the stairs.   Has some trouble going to sleep an staying asleep. Has been on ambien, hydroxizine and clonazepam in the past. Some improvement with clonazepam. Is under stress due to son's HS graduation and her ex-husband coming in to town.   Sees [facility] for diabetes management. Blood sugars are running normal for her.   Past Medical History  Diagnosis Date  . 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 Fibromyalgia   . Hyperlipidemia   . Abdominal pain   . Nausea & vomiting   . Biliary dyskinesia   . Depression   . Joint pain   . Skin rash     due to medications  . Post traumatic stress disorder (PTSD)   . Sleep trouble   . Major depressive disorder ([place])   . Cervical strain   . Closed head injury 2010  . Renal stones   . Migraine     hx of none recent  . Arthritis   . Hypoglycemia   . Anxiety   . Pulmonary embolism [facility])    Past Surgical History  Procedure Laterality Date  . Lipoma removed  yrs ago  . Knee surgery Left 2012  . Cesarean section  1997, 1999  . Cholecystectomy  02/10/2011    Procedure: LAPAROSCOPIC CHOLECYSTECTOMY WITH INTRAOPERATIVE CHOLANGIOGRAM;  Surgeon: Judieth Keens, DO;  Location: [place];  Service: General;  Laterality: N/A;  . Robotic assisted laparoscopic  lysis of adhesion N/A 03/04/2012    Procedure: ROBOTIC ASSISTED LAPAROSCOPIC LYSIS OF EXTENSIVE ADHESIONS;  Surgeon: Claiborne Billings A. Pamala Hurry, MD;  Location: [facility];  Service: Gynecology;  Laterality: N/A;  . Tonsillectomy  2001  . Abdominal hysterectomy  2001  . Eye surgery  2006    laser eye surgery left eye  . Exploratory laparomty  Mar 04, 2012  . Laparoscopic appendectomy N/A 10/12/2012    Procedure: DIAGNOSTIC APPENDECTOMY LAPAROSCOPIC;  Surgeon: Madilyn Hook, DO;  Location: [facility];  Service: General;  Laterality: N/A;  . Appendectomy     Family History  Problem Relation Age of Onset  . Cancer Father     lymphoma  . Nephrolithiasis Father   . Vascular Disease Father   . Alcohol abuse Father   . Drug abuse Father   . Cancer Maternal Grandmother     colon  . Hypertension Mother   . Heart disease Mother   . Cataracts Mother   . Depression Mother   . Diabetes Brother   . Drug abuse Brother   . Mental illness Maternal Grandfather   . Cancer Maternal Grandfather   . Heart disease Paternal Grandmother   . Heart disease Paternal Grandfather   . Suicidality Maternal Uncle  Social History  Substance Use Topics  . Smoking status: Never Smoker   . Smokeless tobacco: Never Used  . Alcohol Use: No      Review of Systems Per HPI    Objective:   Physical Exam  Constitutional: She is oriented to person, place, and time. She appears well-developed and well-nourished. No distress.  HENT:  Head: Normocephalic.  Cardiovascular: Normal rate, regular rhythm and normal heart sounds.   Pulmonary/Chest: Effort normal. She exhibits tenderness (diffuse mild tenderness over sternum).  Musculoskeletal: Normal range of motion.  Neurological: She is alert and oriented to person, place, and time.  Skin: Skin is warm and dry. She is not diaphoretic.  Psychiatric: She has a normal mood and affect. Her behavior is normal. Judgment and thought content normal.         BP 114/76 mmHg  Pulse 111   Temp(Src) 99.4 F (37.4 C)  Resp 16  Ht 5\' 5"  (1.651 m)  Wt 168 lb (76.204 kg)  BMI 27.96 kg/m2  SpO2 99% Wt Readings from Last 3 Encounters:  06/28/15 168 lb (76.204 kg)  06/26/15 165 lb (74.844 kg)  04/07/15 169 lb 12.8 oz (77.021 kg)  Recheck HR 96   Assessment & Plan:  1. Chest pain, unspecified chest pain type - reviewed ER work up and reassured patient - ranitidine (ZANTAC) 150 MG tablet; Take 1 tablet (150 mg total) by mouth 2 (two) times daily.  Dispense: 60 tablet; Refill: 2  2. Hyperlipidemia - atorvastatin (LIPITOR) 40 MG tablet; Take 1 tablet (40 mg total) by mouth daily.  Dispense: 90 tablet; Refill: 3  3. Insomnia - discussed sleep hygiene and stress management, encouraged regular exercise, yoga/meditation - clonazePAM (KLONOPIN) 0.5 MG tablet; Take 1 tablet (0.5 mg total) by mouth at bedtime as needed for anxiety.  Dispense: 20 tablet; Refill: 0   Clarene Reamer, FNP-BC  [facility]  06/28/2015 5:09 PM

## 2015-06-28 NOTE — Patient Instructions (Signed)
     IF you received an x-ray today, you will receive an invoice from [facility]. Please contact [facility] at [phone] with questions or concerns regarding your invoice.   IF you received labwork today, you will receive an invoice from [facility]. Please contact [facility] at [phone] with questions or concerns regarding your invoice.   Our billing staff will not be able to assist you with questions regarding bills from these companies.  You will be contacted with the lab results as soon as they are available. The fastest way to get your results is to activate your My Chart account. Instructions are located on the last page of this paperwork. If you have not heard from us regarding the results in 2 weeks, please contact this office.      

## 2015-07-03 DIAGNOSIS — E1142 Type 2 diabetes mellitus with diabetic polyneuropathy: Secondary | ICD-10-CM | POA: Diagnosis not present

## 2015-07-03 DIAGNOSIS — B0229 Other postherpetic nervous system involvement: Secondary | ICD-10-CM | POA: Diagnosis not present

## 2015-07-03 DIAGNOSIS — G894 Chronic pain syndrome: Secondary | ICD-10-CM | POA: Diagnosis not present

## 2015-07-03 DIAGNOSIS — M47817 Spondylosis without myelopathy or radiculopathy, lumbosacral region: Secondary | ICD-10-CM | POA: Diagnosis not present

## 2015-07-14 IMAGING — CR DG KNEE COMPLETE 4+V*R*
5 series · 5 of 5 positions shown · non-contrast
Comparison: 02/14/2010

CLINICAL DATA: Persistent pain post fall 2 weeks ago

EXAM:
RIGHT KNEE - COMPLETE 4+ VIEW

[AP]
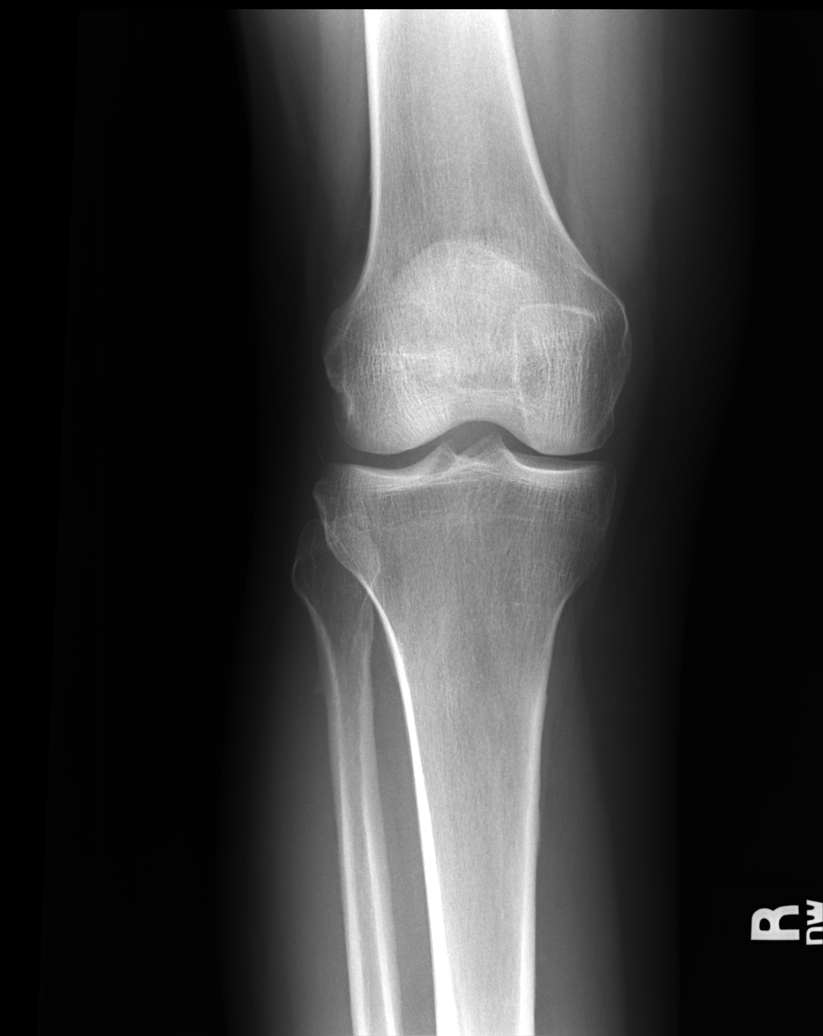

[lateral]
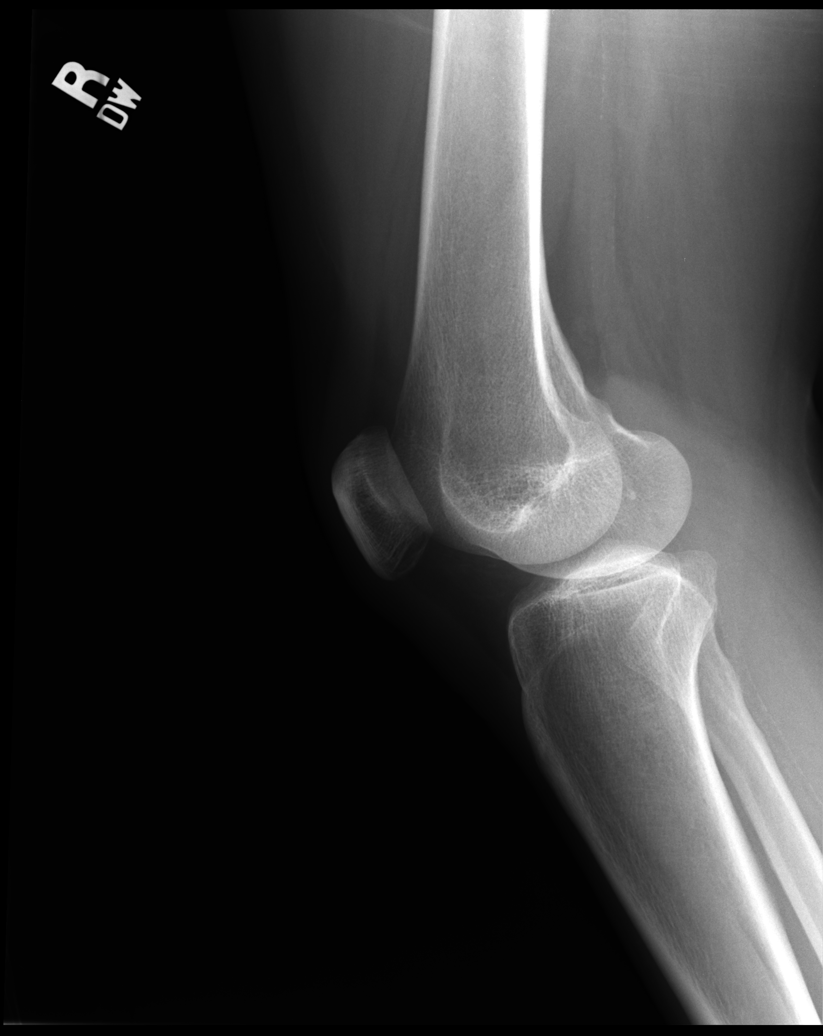

[ap ext rot]
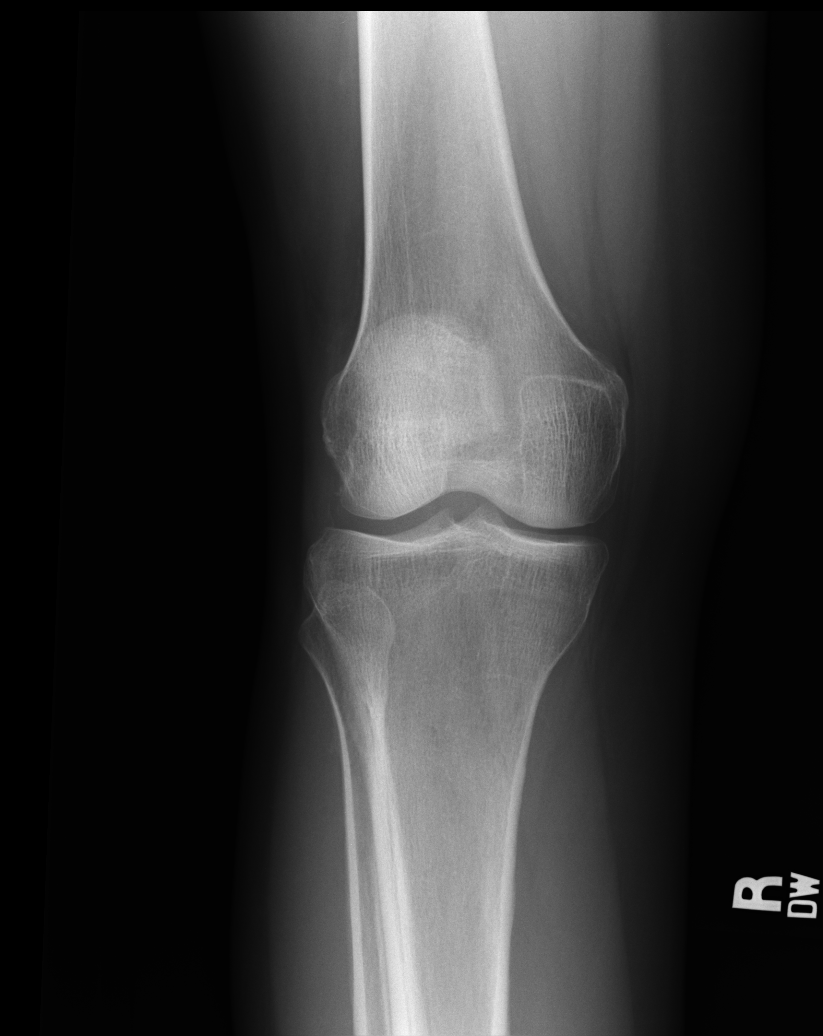

[ap int rot]
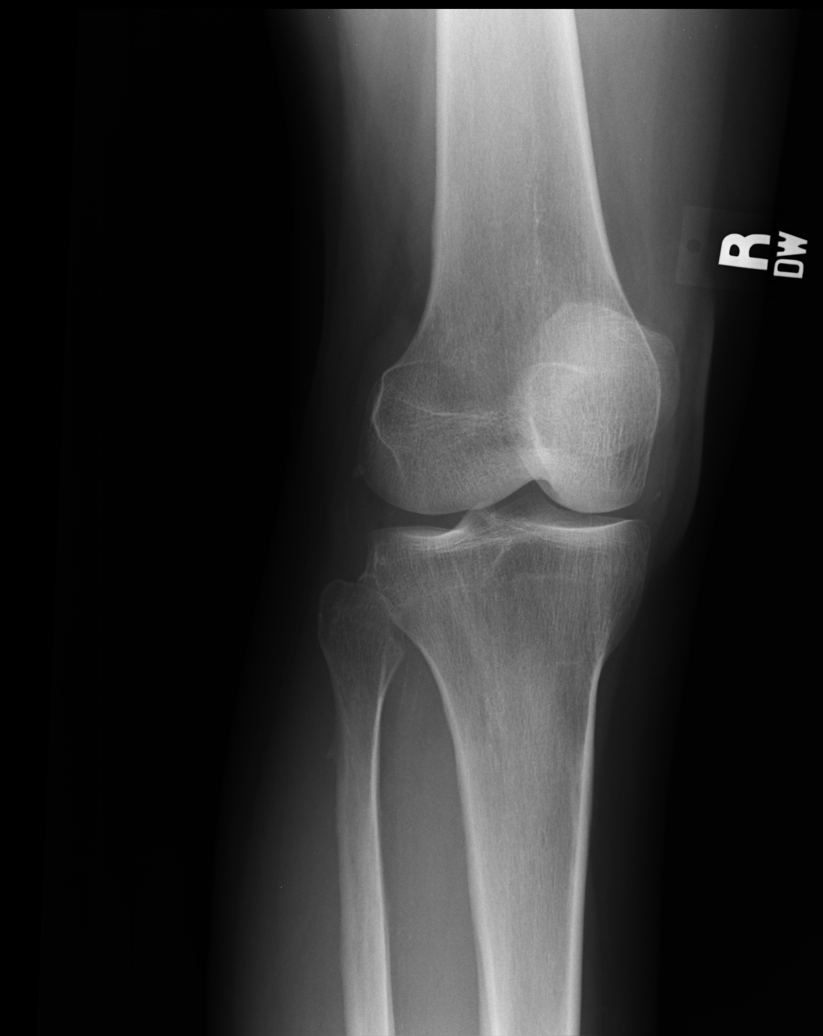

[sunrise]
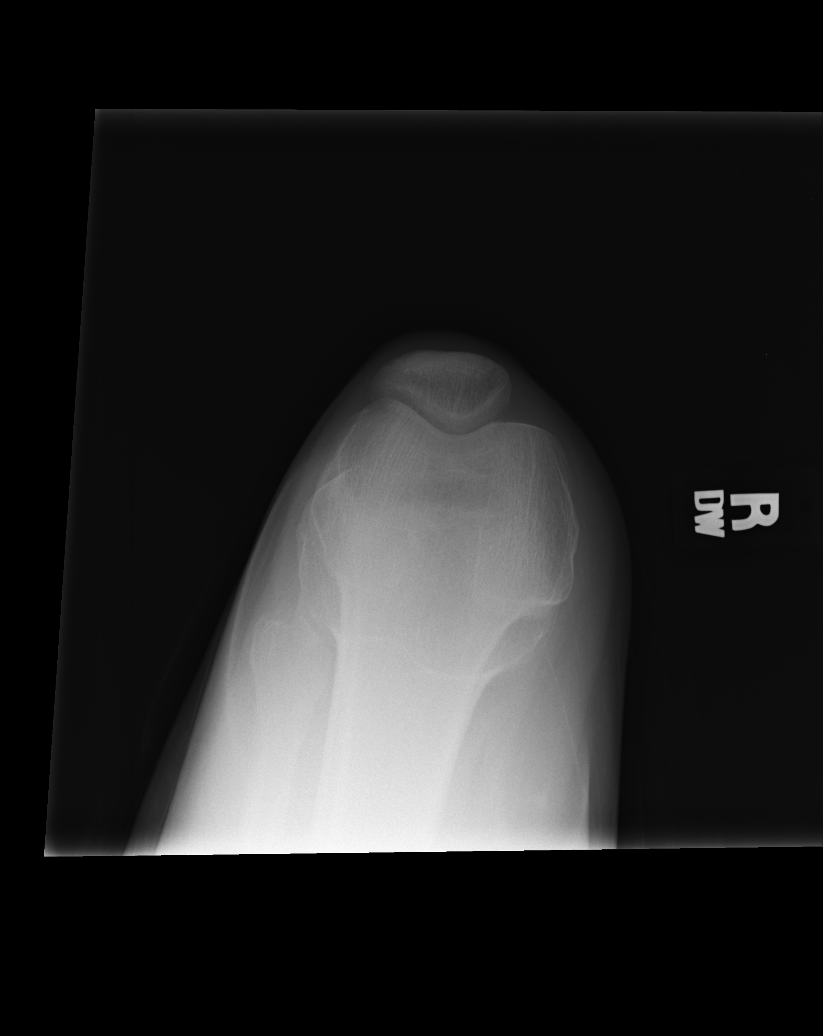

[5 of 5 positions shown; findings below may reference images not displayed]

FINDINGS: Five views of the right knee submitted. No acute fracture or
subluxation. Minimal narrowing of medial joint compartment. Mild
narrowing of patellofemoral joint space.
IMPRESSION: No acute fracture or subluxation.  Minimal degenerative changes.

## 2015-07-25 ENCOUNTER — Other Ambulatory Visit: Payer: Self-pay | Admitting: Family Medicine

## 2015-07-26 NOTE — Telephone Encounter (Signed)
Forwarding this to PA pool instead of Debbie, since she is still part of [facility] system and would see this request.

## 2015-07-27 NOTE — Telephone Encounter (Signed)
She should follow up with a provider at the clinic.  I will refill for the 20 tablets. She will not have another.

## 2015-07-27 NOTE — Telephone Encounter (Signed)
LMOVM - pt received 20 tabs of Klonopin. Needs to see PCP or Primary provider here at [facility] prior to any further refills.   Rx faxed to [facility]

## 2015-07-30 ENCOUNTER — Encounter (HOSPITAL_BASED_OUTPATIENT_CLINIC_OR_DEPARTMENT_OTHER): Payer: Self-pay

## 2015-07-30 ENCOUNTER — Emergency Department (HOSPITAL_BASED_OUTPATIENT_CLINIC_OR_DEPARTMENT_OTHER)
Admission: EM | Admit: 2015-07-30 | Discharge: 2015-07-30 | Disposition: A | Discharge status: Home / Self Care | Payer: BLUE CROSS/BLUE SHIELD | Attending: Emergency Medicine | Admitting: Emergency Medicine

## 2015-07-30 ENCOUNTER — Emergency Department (HOSPITAL_BASED_OUTPATIENT_CLINIC_OR_DEPARTMENT_OTHER): Payer: BLUE CROSS/BLUE SHIELD

## 2015-07-30 DIAGNOSIS — F329 Major depressive disorder, single episode, unspecified: Secondary | ICD-10-CM | POA: Insufficient documentation

## 2015-07-30 DIAGNOSIS — Y929 Unspecified place or not applicable: Secondary | ICD-10-CM | POA: Insufficient documentation

## 2015-07-30 DIAGNOSIS — Y999 Unspecified external cause status: Secondary | ICD-10-CM | POA: Diagnosis not present

## 2015-07-30 DIAGNOSIS — E109 Type 1 diabetes mellitus without complications: Secondary | ICD-10-CM | POA: Diagnosis not present

## 2015-07-30 DIAGNOSIS — M1990 Unspecified osteoarthritis, unspecified site: Secondary | ICD-10-CM | POA: Insufficient documentation

## 2015-07-30 DIAGNOSIS — M25562 Pain in left knee: Secondary | ICD-10-CM

## 2015-07-30 DIAGNOSIS — W19XXXA Unspecified fall, initial encounter: Secondary | ICD-10-CM | POA: Insufficient documentation

## 2015-07-30 DIAGNOSIS — Y939 Activity, unspecified: Secondary | ICD-10-CM | POA: Insufficient documentation

## 2015-07-30 DIAGNOSIS — E785 Hyperlipidemia, unspecified: Secondary | ICD-10-CM | POA: Diagnosis not present

## 2015-07-30 DIAGNOSIS — M79672 Pain in left foot: Secondary | ICD-10-CM | POA: Insufficient documentation

## 2015-07-30 LAB — CBG MONITORING, ED
Glucose-Capillary: 50 mg/dL — ABNORMAL LOW (ref 65–99)
Glucose-Capillary: 53 mg/dL — ABNORMAL LOW (ref 65–99)
Glucose-Capillary: 78 mg/dL (ref 65–99)

## 2015-07-30 MED ORDER — ACETAMINOPHEN 500 MG PO TABS
1000.0000 mg | ORAL_TABLET | Freq: Once | ORAL | Status: AC
  Administered 2015-07-30: 1000 mg via ORAL
  Filled 2015-07-30: qty 2

## 2015-07-30 NOTE — ED Provider Notes (Signed)
CSN: [id number]     Arrival date & time 07/30/15  1933 History  By signing my name below, I, Irene Pap, attest that this documentation has been prepared under the direction and in the presence of Margarita Mail, PA-C. Electronically Signed: Irene Pap, ED Scribe. 07/30/2015. 8:14 PM.   Chief Complaint  Patient presents with  . Fall   The history is provided by the patient. No language interpreter was used.  HPI Comments: Maureen [name] is a 45 y.o. Female with a hx of Type I DM, fibromyalgia, PE, and arthritis who presents to the Emergency Department complaining of a fall onset one day ago. She reports associated pain to the bilateral knees and bilateral feet, left worse than right, with abrasions to the areas. Pt broke a bone in her left foot in 03/2015 and recently stopped wearing her boot. She was told that she was unable to get surgery on the foot due to her hx of DM. She denies hitting head, joint swelling, color change, wound, numbness, weakness, or LOC.   Past Medical History  Diagnosis Date  . 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 Fibromyalgia   . Hyperlipidemia   . Abdominal pain   . Nausea & vomiting   . Biliary dyskinesia   . Depression   . Joint pain   . Skin rash     due to medications  . Post traumatic stress disorder (PTSD)   . Sleep trouble   . Major depressive disorder ([place])   . Cervical strain   . Closed head injury 2010  . Renal stones   . Migraine     hx of none recent  . Arthritis   . Hypoglycemia   . Anxiety   . Pulmonary embolism [facility])    Past Surgical History  Procedure Laterality Date  . Lipoma removed  yrs ago  . Knee surgery Left 2012  . Cesarean section  1997, 1999  . Cholecystectomy  02/10/2011    Procedure: LAPAROSCOPIC CHOLECYSTECTOMY WITH INTRAOPERATIVE CHOLANGIOGRAM;  Surgeon: Judieth Keens, DO;  Location: [place];  Service: General;  Laterality: N/A;  . Robotic assisted laparoscopic  lysis of adhesion N/A 03/04/2012    Procedure: ROBOTIC ASSISTED LAPAROSCOPIC LYSIS OF EXTENSIVE ADHESIONS;  Surgeon: Claiborne Billings A. Pamala Hurry, MD;  Location: [facility];  Service: Gynecology;  Laterality: N/A;  . Tonsillectomy  2001  . Abdominal hysterectomy  2001  . Eye surgery  2006    laser eye surgery left eye  . Exploratory laparomty  Mar 04, 2012  . Laparoscopic appendectomy N/A 10/12/2012    Procedure: DIAGNOSTIC APPENDECTOMY LAPAROSCOPIC;  Surgeon: Madilyn Hook, DO;  Location: [facility];  Service: General;  Laterality: N/A;  . Appendectomy     Family History  Problem Relation Age of Onset  . Cancer Father     lymphoma  . Nephrolithiasis Father   . Vascular Disease Father   . Alcohol abuse Father   . Drug abuse Father   . Cancer Maternal Grandmother     colon  . Hypertension Mother   . Heart disease Mother   . Cataracts Mother   . Depression Mother   . Diabetes Brother   . Drug abuse Brother   . Mental illness Maternal Grandfather   . Cancer Maternal Grandfather   . Heart disease Paternal Grandmother   . Heart disease Paternal Grandfather   . Suicidality Maternal Uncle    Social History  Substance Use Topics  . Smoking  status: Never Smoker   . Smokeless tobacco: Never Used  . Alcohol Use: No   OB History    No data available     Review of Systems  Musculoskeletal: Positive for arthralgias. Negative for joint swelling.  Skin: Positive for wound. Negative for color change.  Neurological: Negative for weakness and numbness.   Allergies  Cephalexin; Ibuprofen; and Meloxicam  Home Medications   Prior to Admission medications   Medication Sig Start Date End Date Taking? Authorizing Provider  acetaminophen (TYLENOL) 500 MG tablet Take 500 mg by mouth every 6 (six) hours as needed for mild pain. Reported on 01/23/2015    Historical Provider, MD  aspirin 81 MG tablet Take 81 mg by mouth daily.    Historical Provider, MD  atorvastatin (LIPITOR) 40 MG tablet Take 1 tablet (40 mg  total) by mouth daily. 06/28/15   Elby Beck, FNP  clonazePAM (KLONOPIN) 0.5 MG tablet TAKE 1 TABLET BY MOUTH AT BEDTIME AS NEEDED FOR ANXIETY 07/27/15   Dorian Heckle English, PA  insulin aspart (NOVOLOG) 100 UNIT/ML injection Inject 8 Units into the skin 3 (three) times daily with meals. 10/16/13   Kerrie Buffalo, NP  insulin glargine (LANTUS) 100 UNIT/ML injection Inject 26 Units into the skin at bedtime.     Historical Provider, MD  LANTUS SOLOSTAR 100 UNIT/ML Solostar Pen INJECT 25 UNITS SUBCUTANEOUSLY ONCE DAILY 03/24/15   Historical Provider, MD  ranitidine (ZANTAC) 150 MG tablet Take 1 tablet (150 mg total) by mouth 2 (two) times daily. 06/28/15   Elby Beck, FNP   BP 132/76 mmHg  Pulse 104  Temp(Src) 99 F (37.2 C) (Oral)  Resp 18  Ht 5\' 4"  (1.626 m)  Wt 165 lb (74.844 kg)  BMI 28.31 kg/m2  SpO2 99% Physical Exam  Constitutional: She is oriented to person, place, and time. She appears well-developed and well-nourished.  HENT:  Head: Normocephalic and atraumatic.  Eyes: EOM are normal. Pupils are equal, round, and reactive to light.  Neck: Normal range of motion. Neck supple.  Cardiovascular: Normal rate, regular rhythm and normal heart sounds.  Exam reveals no gallop and no friction rub.   No murmur heard. Pulmonary/Chest: Effort normal and breath sounds normal. She has no wheezes.  Abdominal: Soft. There is no tenderness.  Musculoskeletal: Normal range of motion.  Neurological: She is alert and oriented to person, place, and time.  Skin: Skin is warm and dry.  Psychiatric: She has a normal mood and affect. Her behavior is normal.  Nursing note and vitals reviewed.   ED Course  Procedures (including critical care time) DIAGNOSTIC STUDIES: Oxygen Saturation is 99% on RA, normal by my interpretation.    COORDINATION OF CARE: 8:11 PM-Discussed treatment plan which includes x-ray with pt at bedside and pt agreed to plan.    Labs Review Labs Reviewed - No data to  display  Imaging Review No results found. I have personally reviewed and evaluated these images and lab results as part of my medical decision-making.   EKG Interpretation None      MDM   Patient X-Ray negative for obvious fracture or dislocation. Pain managed in ED. Pt advised to follow up with orthopedics if symptoms persist for possibility of missed fracture diagnosis. Patient given brace while in ED, conservative therapy recommended and discussed. Patient will be dc home & is agreeable with above plan.  Final diagnoses:  Fall, initial encounter  Knee pain, acute, left  Foot pain, left  Patient X-Ray negative for obvious fracture or dislocation. Pain managed in ED. Pt advised to follow up with orthopedics if symptoms persist for possibility of missed fracture diagnosis.\ Patient will be dc home & is agreeable with above plan.  I personally performed the services described in this documentation, which was scribed in my presence. The recorded information has been reviewed and is accurate.      Margarita Mail, PA-C 07/30/15 [place], DO 07/30/15 [id number]

## 2015-07-30 NOTE — ED Notes (Signed)
CBG 50. Pt remains A/O. Pt given 8oz of fruit juice, crackers, and cheese. Provider notified.

## 2015-07-30 NOTE — Discharge Instructions (Signed)
Knee Pain Knee pain is a very common symptom and can have many causes. Knee pain often goes away when you follow your health care provider's instructions for relieving pain and discomfort at home. However, knee pain can develop into a condition that needs treatment. Some conditions may include:  Arthritis caused by wear and tear (osteoarthritis).  Arthritis caused by swelling and irritation (rheumatoid arthritis or gout).  A cyst or growth in your knee.  An infection in your knee joint.  An injury that will not heal.  Damage, swelling, or irritation of the tissues that support your knee (torn ligaments or tendinitis). If your knee pain continues, additional tests may be ordered to diagnose your condition. Tests may include X-rays or other imaging studies of your knee. You may also need to have fluid removed from your knee. Treatment for ongoing knee pain depends on the cause, but treatment may include:  Medicines to relieve pain or swelling.  Steroid injections in your knee.  Physical therapy.  Surgery. HOME CARE INSTRUCTIONS  Take medicines only as directed by your health care provider.  Rest your knee and keep it raised (elevated) while you are resting.  Do not do things that cause or worsen pain.  Avoid high-impact activities or exercises, such as running, jumping rope, or doing jumping jacks.  Apply ice to the knee area:  Put ice in a plastic bag.  Place a towel between your skin and the bag.  Leave the ice on for 20 minutes, 2-3 times a day.  Ask your health care provider if you should wear an elastic knee support.  Keep a pillow under your knee when you sleep.  Lose weight if you are overweight. Extra weight can put pressure on your knee.  Do not use any tobacco products, including cigarettes, chewing tobacco, or electronic cigarettes. If you need help quitting, ask your health care provider. Smoking may slow the healing of any bone and joint problems that you may  have. SEEK MEDICAL CARE IF:  Your knee pain continues, changes, or gets worse.  You have a fever along with knee pain.  Your knee buckles or locks up.  Your knee becomes more swollen. SEEK IMMEDIATE MEDICAL CARE IF:   Your knee joint feels hot to the touch.  You have chest pain or trouble breathing.   This information is not intended to replace advice given to you by your health care provider. Make sure you discuss any questions you have with your health care provider.   Document Released: 11/02/2006 Document Revised: 01/26/2014 Document Reviewed: 08/21/2013 Elsevier Interactive Patient Education 2016 [place].  Cryotherapy Cryotherapy means treatment with cold. Ice or gel packs can be used to reduce both pain and swelling. Ice is the most helpful within the first 24 to 48 hours after an injury or flare-up from overusing a muscle or joint. Sprains, strains, spasms, burning pain, shooting pain, and aches can all be eased with ice. Ice can also be used when recovering from surgery. Ice is effective, has very few side effects, and is safe for most people to use. PRECAUTIONS  Ice is not a safe treatment option for people with:  Raynaud phenomenon. This is a condition affecting small blood vessels in the extremities. Exposure to cold may cause your problems to return.  Cold hypersensitivity. There are many forms of cold hypersensitivity, including:  Cold urticaria. Red, itchy hives appear on the skin when the tissues begin to warm after being iced.  Cold erythema. This is  a red, itchy rash caused by exposure to cold.  Cold hemoglobinuria. Red blood cells break down when the tissues begin to warm after being iced. The hemoglobin that carry oxygen are passed into the urine because they cannot combine with blood proteins fast enough.  Numbness or altered sensitivity in the area being iced. If you have any of the following conditions, do not use ice until you have discussed  cryotherapy with your caregiver:  Heart conditions, such as arrhythmia, angina, or chronic heart disease.  High blood pressure.  Healing wounds or open skin in the area being iced.  Current infections.  Rheumatoid arthritis.  Poor circulation.  Diabetes. Ice slows the blood flow in the region it is applied. This is beneficial when trying to stop inflamed tissues from spreading irritating chemicals to surrounding tissues. However, if you expose your skin to cold temperatures for too long or without the proper protection, you can damage your skin or nerves. Watch for signs of skin damage due to cold. HOME CARE INSTRUCTIONS Follow these tips to use ice and cold packs safely.  Place a dry or damp towel between the ice and skin. A damp towel will cool the skin more quickly, so you may need to shorten the time that the ice is used.  For a more rapid response, add gentle compression to the ice.  Ice for no more than 10 to 20 minutes at a time. The bonier the area you are icing, the less time it will take to get the benefits of ice.  Check your skin after 5 minutes to make sure there are no signs of a poor response to cold or skin damage.  Rest 20 minutes or more between uses.  Once your skin is numb, you can end your treatment. You can test numbness by very lightly touching your skin. The touch should be so light that you do not see the skin dimple from the pressure of your fingertip. When using ice, most people will feel these normal sensations in this order: cold, burning, aching, and numbness.  Do not use ice on someone who cannot communicate their responses to pain, such as small children or people with dementia. HOW TO MAKE AN ICE PACK Ice packs are the most common way to use ice therapy. Other methods include ice massage, ice baths, and cryosprays. Muscle creams that cause a cold, tingly feeling do not offer the same benefits that ice offers and should not be used as a substitute  unless recommended by your caregiver. To make an ice pack, do one of the following:  Place crushed ice or a bag of frozen vegetables in a sealable plastic bag. Squeeze out the excess air. Place this bag inside another plastic bag. Slide the bag into a pillowcase or place a damp towel between your skin and the bag.  Mix 3 parts water with 1 part rubbing alcohol. Freeze the mixture in a sealable plastic bag. When you remove the mixture from the freezer, it will be slushy. Squeeze out the excess air. Place this bag inside another plastic bag. Slide the bag into a pillowcase or place a damp towel between your skin and the bag. SEEK MEDICAL CARE IF:  You develop white spots on your skin. This may give the skin a blotchy (mottled) appearance.  Your skin turns blue or pale.  Your skin becomes waxy or hard.  Your swelling gets worse. MAKE SURE YOU:   Understand these instructions.  Will watch your condition.  Will get help right away if you are not doing well or get worse.   This information is not intended to replace advice given to you by your health care provider. Make sure you discuss any questions you have with your health care provider.   Document Released: 09/01/2010 Document Revised: 01/26/2014 Document Reviewed: 09/01/2010 Elsevier Interactive Patient Education [employer].

## 2015-07-30 NOTE — ED Notes (Signed)
Repeat CBG 53. Pt given another 8oz of juice and crackers with peanut butter.

## 2015-07-30 NOTE — ED Notes (Signed)
Fall Sunday am-pain to both knees, feet-recent broken bone left foot-NAD-steady gait

## 2015-07-30 NOTE — ED Notes (Signed)
I called XR about the results that are still not showing up in the system. They said they will call me back.

## 2015-08-01 DIAGNOSIS — G894 Chronic pain syndrome: Secondary | ICD-10-CM | POA: Diagnosis not present

## 2015-08-01 DIAGNOSIS — M47817 Spondylosis without myelopathy or radiculopathy, lumbosacral region: Secondary | ICD-10-CM | POA: Diagnosis not present

## 2015-08-01 DIAGNOSIS — B0229 Other postherpetic nervous system involvement: Secondary | ICD-10-CM | POA: Diagnosis not present

## 2015-08-01 DIAGNOSIS — E1142 Type 2 diabetes mellitus with diabetic polyneuropathy: Secondary | ICD-10-CM | POA: Diagnosis not present

## 2015-08-11 ENCOUNTER — Encounter (HOSPITAL_BASED_OUTPATIENT_CLINIC_OR_DEPARTMENT_OTHER): Payer: Self-pay | Admitting: *Deleted

## 2015-08-11 ENCOUNTER — Emergency Department (HOSPITAL_BASED_OUTPATIENT_CLINIC_OR_DEPARTMENT_OTHER): Payer: BLUE CROSS/BLUE SHIELD

## 2015-08-11 ENCOUNTER — Emergency Department (HOSPITAL_BASED_OUTPATIENT_CLINIC_OR_DEPARTMENT_OTHER)
Admission: EM | Admit: 2015-08-11 | Discharge: 2015-08-11 | Disposition: A | Discharge status: Home / Self Care | Payer: BLUE CROSS/BLUE SHIELD | Attending: Emergency Medicine | Admitting: Emergency Medicine

## 2015-08-11 DIAGNOSIS — Z794 Long term (current) use of insulin: Secondary | ICD-10-CM | POA: Insufficient documentation

## 2015-08-11 DIAGNOSIS — F329 Major depressive disorder, single episode, unspecified: Secondary | ICD-10-CM | POA: Diagnosis not present

## 2015-08-11 DIAGNOSIS — E1040 Type 1 diabetes mellitus with diabetic neuropathy, unspecified: Secondary | ICD-10-CM | POA: Diagnosis not present

## 2015-08-11 DIAGNOSIS — E1010 Type 1 diabetes mellitus with ketoacidosis without coma: Secondary | ICD-10-CM | POA: Diagnosis not present

## 2015-08-11 DIAGNOSIS — E10319 Type 1 diabetes mellitus with unspecified diabetic retinopathy without macular edema: Secondary | ICD-10-CM | POA: Insufficient documentation

## 2015-08-11 DIAGNOSIS — Z7982 Long term (current) use of aspirin: Secondary | ICD-10-CM | POA: Insufficient documentation

## 2015-08-11 DIAGNOSIS — Z79899 Other long term (current) drug therapy: Secondary | ICD-10-CM | POA: Diagnosis not present

## 2015-08-11 DIAGNOSIS — R110 Nausea: Secondary | ICD-10-CM | POA: Insufficient documentation

## 2015-08-11 DIAGNOSIS — R1010 Upper abdominal pain, unspecified: Secondary | ICD-10-CM | POA: Insufficient documentation

## 2015-08-11 DIAGNOSIS — J029 Acute pharyngitis, unspecified: Secondary | ICD-10-CM | POA: Insufficient documentation

## 2015-08-11 DIAGNOSIS — B9789 Other viral agents as the cause of diseases classified elsewhere: Secondary | ICD-10-CM

## 2015-08-11 DIAGNOSIS — M549 Dorsalgia, unspecified: Secondary | ICD-10-CM | POA: Diagnosis not present

## 2015-08-11 DIAGNOSIS — E785 Hyperlipidemia, unspecified: Secondary | ICD-10-CM | POA: Insufficient documentation

## 2015-08-11 DIAGNOSIS — J028 Acute pharyngitis due to other specified organisms: Secondary | ICD-10-CM

## 2015-08-11 LAB — CBC WITH DIFFERENTIAL/PLATELET
Basophils Absolute: 0.1 10*3/uL (ref 0.0–0.1)
Basophils Relative: 1 %
Eosinophils Absolute: 0.1 10*3/uL (ref 0.0–0.7)
Eosinophils Relative: 1 %
HCT: 44.3 % (ref 36.0–46.0)
Hemoglobin: 15.4 g/dL — ABNORMAL HIGH (ref 12.0–15.0)
Lymphocytes Relative: 16 %
Lymphs Abs: 2.3 10*3/uL (ref 0.7–4.0)
MCH: 32.3 pg (ref 26.0–34.0)
MCHC: 34.8 g/dL (ref 30.0–36.0)
MCV: 92.9 fL (ref 78.0–100.0)
Monocytes Absolute: 0.6 10*3/uL (ref 0.1–1.0)
Monocytes Relative: 4 %
Neutro Abs: 11.5 10*3/uL — ABNORMAL HIGH (ref 1.7–7.7)
Neutrophils Relative %: 78 %
Platelets: 340 10*3/uL (ref 150–400)
RBC: 4.77 MIL/uL (ref 3.87–5.11)
RDW: 12.2 % (ref 11.5–15.5)
WBC: 14.6 10*3/uL — ABNORMAL HIGH (ref 4.0–10.5)

## 2015-08-11 LAB — LIPASE, BLOOD: Lipase: 11 U/L (ref 11–51)

## 2015-08-11 LAB — COMPREHENSIVE METABOLIC PANEL
ALT: 26 U/L (ref 14–54)
AST: 23 U/L (ref 15–41)
Albumin: 4.2 g/dL (ref 3.5–5.0)
Alkaline Phosphatase: 147 U/L — ABNORMAL HIGH (ref 38–126)
Anion gap: 11 (ref 5–15)
BUN: 11 mg/dL (ref 6–20)
CO2: 28 mmol/L (ref 22–32)
Calcium: 9.6 mg/dL (ref 8.9–10.3)
Chloride: 97 mmol/L — ABNORMAL LOW (ref 101–111)
Creatinine, Ser: 0.99 mg/dL (ref 0.44–1.00)
GFR calc Af Amer: >60 mL/min (ref >60)
GFR calc non Af Amer: >60 mL/min (ref >60)
Glucose, Bld: 198 mg/dL — ABNORMAL HIGH (ref 65–99)
Potassium: 3.9 mmol/L (ref 3.5–5.1)
Sodium: 136 mmol/L (ref 135–145)
Total Bilirubin: 0.6 mg/dL (ref 0.3–1.2)
Total Protein: 8.1 g/dL (ref 6.5–8.1)

## 2015-08-11 LAB — URINE MICROSCOPIC-ADD ON
RBC / HPF: NONE SEEN RBC/hpf (ref 0–5)
WBC, UA: NONE SEEN WBC/hpf (ref 0–5)

## 2015-08-11 LAB — URINALYSIS, ROUTINE W REFLEX MICROSCOPIC
Bilirubin Urine: NEGATIVE
Glucose, UA: >1000 mg/dL — AB
Hgb urine dipstick: NEGATIVE
Ketones, ur: 15 mg/dL — AB
Leukocytes, UA: NEGATIVE
Nitrite: NEGATIVE
Protein, ur: 30 mg/dL — AB
Specific Gravity, Urine: 1.038 — ABNORMAL HIGH (ref 1.005–1.030)
pH: 5.5 (ref 5.0–8.0)

## 2015-08-11 LAB — CBG MONITORING, ED: Glucose-Capillary: 184 mg/dL — ABNORMAL HIGH (ref 65–99)

## 2015-08-11 LAB — RAPID STREP SCREEN (MED CTR MEBANE ONLY): Streptococcus, Group A Screen (Direct): NEGATIVE

## 2015-08-11 MED ORDER — DICYCLOMINE HCL 10 MG PO CAPS
10.0000 mg | ORAL_CAPSULE | Freq: Once | ORAL | Status: AC
  Administered 2015-08-11: 10 mg via ORAL
  Filled 2015-08-11: qty 1

## 2015-08-11 MED ORDER — GI COCKTAIL ~~LOC~~
30.0000 mL | Freq: Once | ORAL | Status: AC
  Administered 2015-08-11: 30 mL via ORAL
  Filled 2015-08-11: qty 30

## 2015-08-11 MED ORDER — PROCHLORPERAZINE MALEATE 10 MG PO TABS: 10.0000 mg | ORAL_TABLET | Freq: Three times a day (TID) | ORAL | 0 refills | Status: DC | PRN

## 2015-08-11 NOTE — ED Provider Notes (Signed)
[facility] Provider Note   CSN: [id number] Arrival date & time: 08/11/15  1137  First Provider Contact:  3:36 PM  By signing my name below, I, Higinio Plan, attest that this documentation has been prepared under the direction and in the presence of Sharlett Iles, MD . Electronically Signed: Higinio Plan, Scribe. 08/11/2015. 6:46 PM.  History   Chief Complaint Chief Complaint  Patient presents with  . Abdominal Pain    HPI HPI Comments: Maureen [name] is a 45 y.o. female with PMHx of Type I DM, PE, and abdominal pain, who presents to the Emergency Department complaining of gradually worsening, intermittent, radiating, periumbilical abdominal pain that began last night and worsened this morning. Pt reports her pain radiates to her lower back; though, she notes hx of chronic back pain. Pt notes her pain was improving upon entering the ED but then worsened as she was contemplating going home. She states her pain is exacerbated with movement; she states she has not eaten or drank much fluids today. Pt notes this is the first time she has experienced this pain. She reports associated nausea; however, she states this is not unusual. She also reports subjective fever, sore throat, and difficulty urinating that also began today; however, she states she has had multiple ear infections this year that could be associated with this. She denies dysuria, vomiting, and diarrhea. She denies rash, recent travels, antibiotic use, and eating greasy or unusual foods within the past few days. Pt also denies usual use of ibuprofen due to allergy; she states she has taken tylenol to relieve her symptoms today with mild relief. She reports PSHx of appendectomy and cholecystectomy.   The history is provided by the patient. No language interpreter was used.   Past Medical History:  Diagnosis Date  . Abdominal pain   . Anxiety   . Arthritis   . Biliary dyskinesia   . Cervical strain   . Closed  head injury 2010  . Depression   . Fibromyalgia   . Hyperlipidemia   . Hypoglycemia   . Joint pain   . Major depressive disorder ([place])   . Migraine    hx of none recent  . Nausea & vomiting   . Post traumatic stress disorder (PTSD)   . Pulmonary embolism ([place])   . Renal stones   . Skin rash    due to medications  . Sleep trouble   . Well controlled type 1 diabetes mellitus with peripheral neuropathy [facility] [place])     Patient Active Problem List   Diagnosis Date Noted  . Nausea   . Acute kidney injury ([place]) 02/19/2015  . Left flank pain 02/19/2015  . DKA (diabetic ketoacidoses) ([place]) 10/24/2014  . HLD (hyperlipidemia) 10/24/2014  . Hx pulmonary embolism 09/28/2014  . Nausea vomiting and diarrhea 09/03/2014  . Abdominal pain 09/03/2014  . Abnormal LFTs 09/03/2014  . Dizziness 09/03/2014  . Acute pulmonary embolism ([place]) 09/03/2014  . Transaminitis 09/02/2014  . GAD (generalized anxiety disorder) 11/23/2013  . PTSD (post-traumatic stress disorder) 11/23/2013  . Severe major depression without psychotic features ([place]) 10/10/2013  . Major depression ([place]) 05/22/2013  . Chest pain 05/18/2012  . Endometriosis 12/08/2011  . Arthritis of left knee 12/08/2011  . Diabetic retinopathy ([place]) 12/08/2011  . Fibromyalgia 10/23/2011  . DM type 1 (diabetes mellitus, type 1) ([place]) 10/13/2011    Past Surgical History:  Procedure Laterality Date  . ABDOMINAL HYSTERECTOMY  2001  . APPENDECTOMY    . CESAREAN  [place]  . CHOLECYSTECTOMY  02/10/2011   Procedure: LAPAROSCOPIC CHOLECYSTECTOMY WITH INTRAOPERATIVE CHOLANGIOGRAM;  Surgeon: Judieth Keens, DO;  Location: [facility];  Service: General;  Laterality: N/A;  . exploratory laparomty  Mar 04, 2012  . EYE SURGERY  2006   laser eye surgery left eye  . KNEE SURGERY Left 2012  . LAPAROSCOPIC APPENDECTOMY N/A 10/12/2012   Procedure: DIAGNOSTIC APPENDECTOMY LAPAROSCOPIC;  Surgeon: Madilyn Hook, DO;  Location: [facility];  Service: General;   Laterality: N/A;  . lipoma removed  yrs ago  . ROBOTIC ASSISTED LAPAROSCOPIC LYSIS OF ADHESION N/A 03/04/2012   Procedure: ROBOTIC ASSISTED LAPAROSCOPIC LYSIS OF EXTENSIVE ADHESIONS;  Surgeon: Claiborne Billings A. Pamala Hurry, MD;  Location: [facility];  Service: Gynecology;  Laterality: N/A;  . TONSILLECTOMY  2001    OB History    No data available       Home Medications    Prior to Admission medications   Medication Sig Start Date End Date Taking? Authorizing Provider  acetaminophen (TYLENOL) 500 MG tablet Take 500 mg by mouth every 6 (six) hours as needed for mild pain. Reported on 01/23/2015   Yes Historical Provider, MD  aspirin 81 MG tablet Take 81 mg by mouth daily.   Yes Historical Provider, MD  atorvastatin (LIPITOR) 40 MG tablet Take 1 tablet (40 mg total) by mouth daily. 06/28/15  Yes Elby Beck, FNP  clonazePAM (KLONOPIN) 0.5 MG tablet TAKE 1 TABLET BY MOUTH AT BEDTIME AS NEEDED FOR ANXIETY 07/27/15  Yes Dorian Heckle English, PA  insulin aspart (NOVOLOG) 100 UNIT/ML injection Inject 8 Units into the skin 3 (three) times daily with meals. 10/16/13  Yes Kerrie Buffalo, NP  insulin glargine (LANTUS) 100 UNIT/ML injection Inject 26 Units into the skin at bedtime.    Yes Historical Provider, MD  LANTUS SOLOSTAR 100 UNIT/ML Solostar Pen INJECT 25 UNITS SUBCUTANEOUSLY ONCE DAILY 03/24/15  Yes Historical Provider, MD  ranitidine (ZANTAC) 150 MG tablet Take 1 tablet (150 mg total) by mouth 2 (two) times daily. 06/28/15  Yes Elby Beck, FNP  prochlorperazine (COMPAZINE) 10 MG tablet Take 1 tablet (10 mg total) by mouth every 8 (eight) hours as needed for nausea or vomiting (Nausea). 08/11/15   Sharlett Iles, MD    Family History Family History  Problem Relation Age of Onset  . Cancer Father     lymphoma  . Nephrolithiasis Father   . Vascular Disease Father   . Alcohol abuse Father   . Drug abuse Father   . Cancer Maternal Grandmother     colon  . Hypertension Mother   . Heart disease  Mother   . Cataracts Mother   . Depression Mother   . Diabetes Brother   . Drug abuse Brother   . Mental illness Maternal Grandfather   . Cancer Maternal Grandfather   . Heart disease Paternal Grandmother   . Heart disease Paternal Grandfather   . Suicidality Maternal Uncle     Social History Social History  Substance Use Topics  . Smoking status: Never Smoker  . Smokeless tobacco: Never Used  . Alcohol use No     Allergies   Cephalexin; Ibuprofen; and Meloxicam   Review of Systems Review of Systems  Constitutional: Positive for fever.  HENT: Positive for sore throat.   Gastrointestinal: Positive for nausea. Negative for diarrhea and vomiting.  Musculoskeletal: Positive for back pain.  Skin: Negative for rash.     Physical Exam Updated Vital Signs BP 136/76 (  BP Location: Right Arm)   Pulse 90   Temp 98.2 F (36.8 C) (Oral)   Resp 16   Ht 5\' 4"  (1.626 m)   Wt 165 lb (74.8 kg)   SpO2 99%   BMI 28.32 kg/m   Physical Exam  Constitutional: She is oriented to person, place, and time. She appears well-developed and well-nourished. No distress.  HENT:  Head: Normocephalic and atraumatic.  Right Ear: Tympanic membrane and ear canal normal.  Left Ear: Ear canal normal.  Moist mucous membranes; L TM with white debris at bottom of TM,no erythema, TM intact  Eyes: Conjunctivae are normal. Pupils are equal, round, and reactive to light.  Neck: Neck supple.  Cardiovascular: Normal rate, regular rhythm and normal heart sounds.   No murmur heard. Pulmonary/Chest: Effort normal and breath sounds normal.  Abdominal: Soft. Bowel sounds are normal. She exhibits no distension. There is no tenderness.  Musculoskeletal: She exhibits no edema.  Lymphadenopathy:    She has no cervical adenopathy.  Neurological: She is alert and oriented to person, place, and time.  Fluent speech  Skin: Skin is warm and dry. No rash noted.  Psychiatric: She has a normal mood and affect.  Judgment normal.  Nursing note and vitals reviewed.  ED Treatments / Results  Labs (all labs ordered are listed, but only abnormal results are displayed) Labs Reviewed  URINALYSIS, ROUTINE W REFLEX MICROSCOPIC (NOT AT [facility]) - Abnormal; Notable for the following:       Result Value   Specific Gravity, Urine 1.038 (*)    Glucose, UA >1000 (*)    Ketones, ur 15 (*)    Protein, ur 30 (*)    All other components within normal limits  COMPREHENSIVE METABOLIC PANEL - Abnormal; Notable for the following:    Chloride 97 (*)    Glucose, Bld 198 (*)    Alkaline Phosphatase 147 (*)    All other components within normal limits  CBC WITH DIFFERENTIAL/PLATELET - Abnormal; Notable for the following:    WBC 14.6 (*)    Hemoglobin 15.4 (*)    Neutro Abs 11.5 (*)    All other components within normal limits  URINE MICROSCOPIC-ADD ON - Abnormal; Notable for the following:    Squamous Epithelial / LPF 0-5 (*)    Bacteria, UA MANY (*)    Casts HYALINE CASTS (*)    All other components within normal limits  CBG MONITORING, ED - Abnormal; Notable for the following:    Glucose-Capillary 184 (*)    All other components within normal limits  RAPID STREP SCREEN (NOT AT [facility])  CULTURE, GROUP A STREP ([place])  LIPASE, BLOOD  CBG MONITORING, ED    EKG  EKG Interpretation None       Radiology Dg Chest 2 View  Result Date: 08/11/2015 CLINICAL DATA:  Cough and chest and upper abdominal pain for 1 day. EXAM: CHEST  2 VIEW COMPARISON:  CT chest and PA and lateral chest 06/26/2015. FINDINGS: The lungs are clear. Heart size is normal. No pneumothorax or pleural effusion. No bony abnormality. IMPRESSION: Negative chest. Electronically Signed   By: Inge Rise M.D.   On: 08/11/2015 17:37   Procedures Procedures  DIAGNOSTIC STUDIES:  Oxygen Saturation is 99% on RA, normal by my interpretation.    COORDINATION OF CARE:  3:44 PM Discussed treatment plan with pt at bedside and pt agreed to  plan.   Medications Ordered in ED Medications  dicyclomine (BENTYL) capsule 10 mg (10 mg Oral  Given 08/11/15 1611)  gi cocktail (Maalox,Lidocaine,Donnatal) (30 mLs Oral Given 08/11/15 1611)     Initial Impression / Assessment and Plan / ED Course  I have reviewed the triage vital signs and the nursing notes.  Pertinent labs & imaging results that were available during my care of the patient were reviewed by me and considered in my medical decision making (see chart for details).  Clinical Course  Comment By Time  Labs notable for mild leukocytosis, no evidence of UTI, CMP and lipase nl Sharlett Iles, MD 07/23 1708  CXR and strep screen negative Sharlett Iles, MD 07/23 1759   Patient with upper abdominal pain that began last night and persisted today, nausea without any vomiting or diarrhea. She has had mild cough, sore throat. Patient well-appearing with reassuring vital signs at presentation. No focal abdominal tenderness on exam. Obtained above labs and gave the patient Bentyl and GI cocktail.  Her sore throat, cough, and nausea suggest viral process. Her labs here are reassuring and abdominal exam is reassuring. Discussed supportive care including antacids, decongestants for ear pain. Return precautions reviewed and patient voiced understanding. Patient discharged in satisfactory condition.  Final Clinical Impressions(s) / ED Diagnoses   Final diagnoses:  Pain of upper abdomen  Nausea  Sore throat (viral)    New Prescriptions Discharge Medication List as of 08/11/2015  6:48 PM    START taking these medications   Details  prochlorperazine (COMPAZINE) 10 MG tablet Take 1 tablet (10 mg total) by mouth every 8 (eight) hours as needed for nausea or vomiting (Nausea)., Starting Sun 08/11/2015, Print       I personally performed the services described in this documentation, which was scribed in my presence. The recorded information has been reviewed and is accurate.      Sharlett Iles, MD 08/12/15 [phone]

## 2015-08-11 NOTE — ED Triage Notes (Signed)
Per pt report upper abdominal pain started this morning. With nausea no vomiting or diarrhea. Pt reports unsure about fever.

## 2015-08-14 LAB — CULTURE, GROUP A STREP (THRC)

## 2015-08-16 ENCOUNTER — Encounter (HOSPITAL_COMMUNITY): Payer: Self-pay | Admitting: *Deleted

## 2015-08-16 ENCOUNTER — Other Ambulatory Visit (HOSPITAL_COMMUNITY): Payer: Self-pay | Admitting: Psychiatry

## 2015-08-16 ENCOUNTER — Emergency Department (HOSPITAL_COMMUNITY): Payer: BLUE CROSS/BLUE SHIELD

## 2015-08-16 ENCOUNTER — Emergency Department (HOSPITAL_COMMUNITY)
Admission: EM | Admit: 2015-08-16 | Discharge: 2015-08-16 | Disposition: A | Discharge status: Home / Self Care | Payer: BLUE CROSS/BLUE SHIELD | Attending: Emergency Medicine | Admitting: Emergency Medicine

## 2015-08-16 DIAGNOSIS — E1042 Type 1 diabetes mellitus with diabetic polyneuropathy: Secondary | ICD-10-CM | POA: Diagnosis not present

## 2015-08-16 DIAGNOSIS — M25561 Pain in right knee: Secondary | ICD-10-CM | POA: Diagnosis present

## 2015-08-16 DIAGNOSIS — Y929 Unspecified place or not applicable: Secondary | ICD-10-CM | POA: Insufficient documentation

## 2015-08-16 DIAGNOSIS — Y939 Activity, unspecified: Secondary | ICD-10-CM | POA: Insufficient documentation

## 2015-08-16 DIAGNOSIS — Y999 Unspecified external cause status: Secondary | ICD-10-CM | POA: Diagnosis not present

## 2015-08-16 DIAGNOSIS — E10319 Type 1 diabetes mellitus with unspecified diabetic retinopathy without macular edema: Secondary | ICD-10-CM | POA: Diagnosis not present

## 2015-08-16 DIAGNOSIS — E1010 Type 1 diabetes mellitus with ketoacidosis without coma: Secondary | ICD-10-CM | POA: Diagnosis not present

## 2015-08-16 DIAGNOSIS — W109XXA Fall (on) (from) unspecified stairs and steps, initial encounter: Secondary | ICD-10-CM | POA: Diagnosis not present

## 2015-08-16 DIAGNOSIS — Z7982 Long term (current) use of aspirin: Secondary | ICD-10-CM | POA: Diagnosis not present

## 2015-08-16 DIAGNOSIS — F331 Major depressive disorder, recurrent, moderate: Secondary | ICD-10-CM

## 2015-08-16 DIAGNOSIS — Z79899 Other long term (current) drug therapy: Secondary | ICD-10-CM | POA: Diagnosis not present

## 2015-08-16 MED ORDER — HYDROCODONE-ACETAMINOPHEN 5-325 MG PO TABS
1.0000 | ORAL_TABLET | Freq: Once | ORAL | Status: AC
  Administered 2015-08-16: 1 via ORAL
  Filled 2015-08-16: qty 1

## 2015-08-16 NOTE — Discharge Instructions (Signed)
Your x-rays were negative for any acute abnormalities or fractures of your knee. Use ice as often as you can over the next few days. Be sure to keep a thin cloth between your skin and the ice. 20 minutes on and 20 minutes off. Use Tylenol as needed for pain.  Follow-up with Dr. French Ana on Monday to be seen to have your knee reevaluated. Return to the emergency department if you experience numbness/tingling or weakness of your right leg or knee, joint swelling, fever or chills, or any other concerning symptoms.

## 2015-08-16 NOTE — ED Triage Notes (Signed)
The pt fell down some steps last week  C/o rt knee pain since then.  lmp none hyst

## 2015-08-16 NOTE — ED Provider Notes (Signed)
[facility] DEPT Provider Note   CSN: [id number] Arrival date & time: 08/16/15  1740  By signing my name below, I, Emmanuella Mensah, attest that this documentation has been prepared under the direction and in the presence of [employer], PA-C. Electronically Signed: Judithann Sauger, ED Scribe. 08/16/15. 8:28 PM.    History   Chief Complaint Chief Complaint  Patient presents with  . Knee Pain    HPI Comments: Maureen [name] is a 45 y.o. female with a hx of arthritis, HLD, and DM I who presents to the Emergency Department complaining of gradually worsening moderate pain below the right patella with an abrasion that has scabbed over s/p fall that occurred 1 week ago. She notes that the pain radiates down her right lower leg. Pt explains that she fell down the steps. No LOC or head injuries. She states that the pain is worse while sitting and gets relieve from standing but not when she is ambulating. No other alleviating factors noted. She states that she has tried hot baths with no relief. She reports that she had a similar fall with these symptoms to her left knee which required surgery because she had arthritis. She reports an allergy to ibuprofen and meloxicam. No fever, chills, numbness/tingling in toes, or rash.   The history is provided by the patient. No language interpreter was used.    Past Medical History:  Diagnosis Date  . Abdominal pain   . Anxiety   . Arthritis   . Biliary dyskinesia   . Cervical strain   . Closed head injury 2010  . Depression   . Fibromyalgia   . Hyperlipidemia   . Hypoglycemia   . Joint pain   . Major depressive disorder ([place])   . Migraine    hx of none recent  . Nausea & vomiting   . Post traumatic stress disorder (PTSD)   . Pulmonary embolism ([place])   . Renal stones   . Skin rash    due to medications  . Sleep trouble   . Well controlled type 1 diabetes mellitus with peripheral neuropathy [facility])     Patient Active Problem List    Diagnosis Date Noted  . Nausea   . Acute kidney injury ([place]) 02/19/2015  . Left flank pain 02/19/2015  . DKA (diabetic ketoacidoses) ([place]) 10/24/2014  . HLD (hyperlipidemia) 10/24/2014  . Hx pulmonary embolism 09/28/2014  . Nausea vomiting and diarrhea 09/03/2014  . Abdominal pain 09/03/2014  . Abnormal LFTs 09/03/2014  . Dizziness 09/03/2014  . Acute pulmonary embolism ([place]) 09/03/2014  . Transaminitis 09/02/2014  . GAD (generalized anxiety disorder) 11/23/2013  . PTSD (post-traumatic stress disorder) 11/23/2013  . Severe major depression without psychotic features ([place]) 10/10/2013  . Major depression ([place]) 05/22/2013  . Chest pain 05/18/2012  . Endometriosis 12/08/2011  . Arthritis of left knee 12/08/2011  . Diabetic retinopathy ([place]) 12/08/2011  . Fibromyalgia 10/23/2011  . DM type 1 (diabetes mellitus, type 1) ([place]) 10/13/2011    Past Surgical History:  Procedure Laterality Date  . ABDOMINAL HYSTERECTOMY  2001  . APPENDECTOMY    . [place]  . CHOLECYSTECTOMY  02/10/2011   Procedure: LAPAROSCOPIC CHOLECYSTECTOMY WITH INTRAOPERATIVE CHOLANGIOGRAM;  Surgeon: Judieth Keens, DO;  Location: [facility];  Service: General;  Laterality: N/A;  . exploratory laparomty  Mar 04, 2012  . EYE SURGERY  2006   laser eye surgery left eye  . KNEE SURGERY Left 2012  . LAPAROSCOPIC APPENDECTOMY N/A 10/12/2012  Procedure: DIAGNOSTIC APPENDECTOMY LAPAROSCOPIC;  Surgeon: Madilyn Hook, DO;  Location: [facility];  Service: General;  Laterality: N/A;  . lipoma removed  yrs ago  . ROBOTIC ASSISTED LAPAROSCOPIC LYSIS OF ADHESION N/A 03/04/2012   Procedure: ROBOTIC ASSISTED LAPAROSCOPIC LYSIS OF EXTENSIVE ADHESIONS;  Surgeon: Claiborne Billings A. Pamala Hurry, MD;  Location: [facility];  Service: Gynecology;  Laterality: N/A;  . TONSILLECTOMY  2001    OB History    No data available       Home Medications    Prior to Admission medications   Medication Sig Start Date End Date Taking?  Authorizing Provider  acetaminophen (TYLENOL) 500 MG tablet Take 500 mg by mouth every 6 (six) hours as needed for mild pain. Reported on 01/23/2015    Historical Provider, MD  aspirin 81 MG tablet Take 81 mg by mouth daily.    Historical Provider, MD  atorvastatin (LIPITOR) 40 MG tablet Take 1 tablet (40 mg total) by mouth daily. 06/28/15   Elby Beck, FNP  clonazePAM (KLONOPIN) 0.5 MG tablet TAKE 1 TABLET BY MOUTH AT BEDTIME AS NEEDED FOR ANXIETY 07/27/15   Dorian Heckle English, PA  insulin aspart (NOVOLOG) 100 UNIT/ML injection Inject 8 Units into the skin 3 (three) times daily with meals. 10/16/13   Kerrie Buffalo, NP  insulin glargine (LANTUS) 100 UNIT/ML injection Inject 26 Units into the skin at bedtime.     Historical Provider, MD  LANTUS SOLOSTAR 100 UNIT/ML Solostar Pen INJECT 25 UNITS SUBCUTANEOUSLY ONCE DAILY 03/24/15   Historical Provider, MD  prochlorperazine (COMPAZINE) 10 MG tablet Take 1 tablet (10 mg total) by mouth every 8 (eight) hours as needed for nausea or vomiting (Nausea). 08/11/15   Sharlett Iles, MD  ranitidine (ZANTAC) 150 MG tablet Take 1 tablet (150 mg total) by mouth 2 (two) times daily. 06/28/15   Elby Beck, FNP    Family History Family History  Problem Relation Age of Onset  . Cancer Father     lymphoma  . Nephrolithiasis Father   . Vascular Disease Father   . Alcohol abuse Father   . Drug abuse Father   . Cancer Maternal Grandmother     colon  . Hypertension Mother   . Heart disease Mother   . Cataracts Mother   . Depression Mother   . Diabetes Brother   . Drug abuse Brother   . Mental illness Maternal Grandfather   . Cancer Maternal Grandfather   . Heart disease Paternal Grandmother   . Heart disease Paternal Grandfather   . Suicidality Maternal Uncle     Social History Social History  Substance Use Topics  . Smoking status: Never Smoker  . Smokeless tobacco: Never Used  . Alcohol use No     Allergies   Cephalexin; Ibuprofen;  and Meloxicam   Review of Systems Review of Systems  Constitutional: Negative for chills and fever.  Musculoskeletal: Positive for arthralgias.  Skin: Positive for wound. Negative for rash.  Neurological: Negative for numbness.     Physical Exam Updated Vital Signs BP 118/66   Pulse 94   Temp 98.9 F (37.2 C)   Resp 18   Ht 5\' 4"  (1.626 m)   Wt 167 lb 9 oz (76 kg)   SpO2 99%   BMI 28.76 kg/m   Physical Exam  Constitutional: She appears well-developed and well-nourished. No distress.  HENT:  Head: Normocephalic and atraumatic.  Eyes: Conjunctivae are normal.  Pulmonary/Chest: Effort normal. No respiratory distress.  Musculoskeletal: Normal range  of motion.  Examination of the right knee revealed no deformity, ecchymosis, edema, scab just inferior to patella roughly at the insertion point on the tibial plateau, full AROM, TTP 2 patellar tendon, no TTP to joint lines, no crepitus noted, patellar appears to be tracking properly, sensation intact, patient is neurovascular intact distally.  Neurological: She is alert. Coordination normal.  Skin: Skin is warm and dry. She is not diaphoretic.  Psychiatric: She has a normal mood and affect. Her behavior is normal.  Nursing note and vitals reviewed.    ED Treatments / Results  DIAGNOSTIC STUDIES: Oxygen Saturation is 99% on RA, normal by my interpretation.    COORDINATION OF CARE: 8:25 PM- Pt advised of plan for treatment and pt agrees. Pt informed of her lab results. Advised to use ice and Tylenol for pain relief. Recommended to call Dr. French Ana for a follow up appointment.    Radiology Dg Knee Complete 4 Views Right  Result Date: 08/16/2015 CLINICAL DATA:  45 year old female with right knee pain following fall 1 week ago. Initial encounter. EXAM: RIGHT KNEE - COMPLETE 4+ VIEW COMPARISON:  None. FINDINGS: No evidence of fracture, dislocation, or joint effusion. No evidence of arthropathy or other focal bone abnormality. Soft  tissues are unremarkable. IMPRESSION: Negative. Electronically Signed   By: Margarette Canada M.D.   On: 08/16/2015 19:00   Procedures Procedures (including critical care time)  Medications Ordered in ED Medications  HYDROcodone-acetaminophen (NORCO/VICODIN) 5-325 MG per tablet 1 tablet (not administered)     Initial Impression / Assessment and Plan / ED Course  Jackson Latino, PA-C has reviewed the triage vital signs and the nursing notes.  Pertinent labs & imaging results that were available during my care of the patient were reviewed by me and considered in my medical decision making (see chart for details).  Clinical Course   Patient with right knee pain. Presentation concerning for Contusion. X-rays reviewed by me revealed no bony abnormality, dislocation, or fracture. Patient is neurovascularly intact distally and able to move knee and walk although states it is painful. Patient given knee brace in the ED. Discussed RICE and pain medication. Instructed patient to follow up with their primary care provider and Dr. French Ana, orthopedics, within 2-3 days to have right knee reevaluated. Discussed strict return precautions to the ED. patient appears reliable and expressed understanding to the discharge instructions.   I personally performed the services described in this documentation, which was scribed in my presence. The recorded information has been reviewed and is accurate.   Final Clinical Impressions(s) / ED Diagnoses   Final diagnoses:  Knee pain, right       New Prescriptions New Prescriptions   No medications on file     Kalman Drape, [place] 08/16/15 2037    Leonard Schwartz, MD 08/22/15 1700

## 2015-08-17 ENCOUNTER — Ambulatory Visit (INDEPENDENT_AMBULATORY_CARE_PROVIDER_SITE_OTHER): Payer: BLUE CROSS/BLUE SHIELD | Admitting: Family Medicine

## 2015-08-17 VITALS — BP 118/74 | HR 89 | Temp 98.8°F | Resp 18 | Ht 64.0 in | Wt 169.6 lb

## 2015-08-17 DIAGNOSIS — S8001XA Contusion of right knee, initial encounter: Secondary | ICD-10-CM | POA: Diagnosis not present

## 2015-08-17 DIAGNOSIS — M25561 Pain in right knee: Secondary | ICD-10-CM | POA: Diagnosis not present

## 2015-08-17 DIAGNOSIS — G4700 Insomnia, unspecified: Secondary | ICD-10-CM

## 2015-08-17 MED ORDER — TRAZODONE HCL 50 MG PO TABS: 25.0000 mg | ORAL_TABLET | Freq: Every evening | ORAL | 0 refills | Status: DC | PRN

## 2015-08-17 NOTE — Patient Instructions (Addendum)
For your knee, okay to apply ice if needed, wear the brace as recommended from the emergency room last night. I did place a referral to Dr. French Ana. Avoid direct pressure to the front of the knee until you are evaluated by orthopedics.  For your trouble sleeping, I did refer you to a sleep specialist. You can try one half to one of the trazodone at bedtime to see if this helps with sleep. See other information below regarding insomnia. Follow-up within the next 1 month to discuss this further.  Return to the clinic or go to the nearest emergency room if any of your symptoms worsen or new symptoms occur.  Insomnia Insomnia is a sleep disorder that makes it difficult to fall asleep or to stay asleep. Insomnia can cause tiredness (fatigue), low energy, difficulty concentrating, mood swings, and poor performance at work or school.  There are three different ways to classify insomnia:  Difficulty falling asleep.  Difficulty staying asleep.  Waking up too early in the morning. Any type of insomnia can be long-term (chronic) or short-term (acute). Both are common. Short-term insomnia usually lasts for three months or less. Chronic insomnia occurs at least three times a week for longer than three months. CAUSES  Insomnia may be caused by another condition, situation, or substance, such as:  Anxiety.  Certain medicines.  Gastroesophageal reflux disease (GERD) or other gastrointestinal conditions.  Asthma or other breathing conditions.  Restless legs syndrome, sleep apnea, or other sleep disorders.  Chronic pain.  Menopause. This may include hot flashes.  Stroke.  Abuse of alcohol, tobacco, or illegal drugs.  Depression.  Caffeine.   Neurological disorders, such as Alzheimer disease.  An overactive thyroid (hyperthyroidism). The cause of insomnia may not be known. RISK FACTORS Risk factors for insomnia include:  Gender. Women are more commonly affected than men.  Age.  Insomnia is more common as you get older.  Stress. This may involve your professional or personal life.  Income. Insomnia is more common in people with lower income.  Lack of exercise.   Irregular work schedule or night shifts.  Traveling between different time zones. SIGNS AND SYMPTOMS If you have insomnia, trouble falling asleep or trouble staying asleep is the main symptom. This may lead to other symptoms, such as:  Feeling fatigued.  Feeling nervous about going to sleep.  Not feeling rested in the morning.  Having trouble concentrating.  Feeling irritable, anxious, or depressed. TREATMENT  Treatment for insomnia depends on the cause. If your insomnia is caused by an underlying condition, treatment will focus on addressing the condition. Treatment may also include:   Medicines to help you sleep.  Counseling or therapy.  Lifestyle adjustments. HOME CARE INSTRUCTIONS   Take medicines only as directed by your health care provider.  Keep regular sleeping and waking hours. Avoid naps.  Keep a sleep diary to help you and your health care provider figure out what could be causing your insomnia. Include:   When you sleep.  When you wake up during the night.  How well you sleep.   How rested you feel the next day.  Any side effects of medicines you are taking.  What you eat and drink.   Make your bedroom a comfortable place where it is easy to fall asleep:  Put up shades or special blackout curtains to block light from outside.  Use a white noise machine to block noise.  Keep the temperature cool.   Exercise regularly as directed by your  health care provider. Avoid exercising right before bedtime.  Use relaxation techniques to manage stress. Ask your health care provider to suggest some techniques that may work well for you. These may include:  Breathing exercises.  Routines to release muscle tension.  Visualizing peaceful scenes.  Cut back on  alcohol, caffeinated beverages, and cigarettes, especially close to bedtime. These can disrupt your sleep.  Do not overeat or eat spicy foods right before bedtime. This can lead to digestive discomfort that can make it hard for you to sleep.  Limit screen use before bedtime. This includes:  Watching TV.  Using your smartphone, tablet, and computer.  Stick to a routine. This can help you fall asleep faster. Try to do a quiet activity, brush your teeth, and go to bed at the same time each night.  Get out of bed if you are still awake after 15 minutes of trying to sleep. Keep the lights down, but try reading or doing a quiet activity. When you feel sleepy, go back to bed.  Make sure that you drive carefully. Avoid driving if you feel very sleepy.  Keep all follow-up appointments as directed by your health care provider. This is important. SEEK MEDICAL CARE IF:   You are tired throughout the day or have trouble in your daily routine due to sleepiness.  You continue to have sleep problems or your sleep problems get worse. SEEK IMMEDIATE MEDICAL CARE IF:   You have serious thoughts about hurting yourself or someone else.   This information is not intended to replace advice given to you by your health care provider. Make sure you discuss any questions you have with your health care provider.   Document Released: 01/03/2000 Document Revised: 09/26/2014 Document Reviewed: 10/06/2013 Elsevier Interactive Patient Education 2016 [employer].     IF you received an x-ray today, you will receive an invoice from [facility]. Please contact [facility] at [phone] with questions or concerns regarding your invoice.   IF you received labwork today, you will receive an invoice from [employer]. Please contact [facility] at [phone] with questions or concerns regarding your invoice.   Our billing staff will not be able to assist you with questions  regarding bills from these companies.  You will be contacted with the lab results as soon as they are available. The fastest way to get your results is to activate your My Chart account. Instructions are located on the last page of this paperwork. If you have not heard from [place] regarding the results in 2 weeks, please contact this office.

## 2015-08-17 NOTE — Progress Notes (Signed)
Subjective:  By signing my name below, I, Moises Blood, attest that this documentation has been prepared under the direction and in the presence of Merri Ray, MD. Electronically Signed: Moises Blood, [place]. 08/17/2015 , 5:09 PM .  Patient was seen in Room 5 .   Patient ID: Maureen [name], female    DOB: July 25, 1970, 45 y.o.   MRN: [id number] Chief Complaint  Patient presents with   Knee Pain    x1 week, pt. fell on knee    HPI Maureen [name] is a 45 y.o. female Here for right knee pain due to fall which occurred a week ago. H/o multiple medical history including DM, fibromyalgia, left knee arthritis per problem list. She was seen yesterday in the ED for right knee pain, following a fall 1 week ago. She had an xray done of right knee which was negative for fracture, dislocation or effusion. At visit yesterday, reported pain radiating down right lower leg from an abrasion. Diagnosed with probable contusion. She was given a knee brace and instructed to follow up with her orthopedist, Dr. French Ana, within 2-3 days for repeat evaluation. She does have allergy to ibuprofen and meloxicam with nausea and vomiting.   Patient states she wasn't able to have a referral to Dr. French Ana from the ED. She's had similar situation happen to her left knee in the past. She also mentions being diabetic since she was 45 years old, and the arthritis started to bother her after she fell. She's been wearing knee brace over her right knee given by the ED. She notes her pain worsens when she is walking or sitting.   Patient also mentions having trouble getting to sleep and staying asleep. She was up and awake at 3:00AM this morning, believes due to her son being out at night was a factor. She used to see a therapist in the past after she had a divorce; was prescribed prozac and klonopin. She's currently on klonopin, but wants to switch off of it. She denies having a psychiatrist or therapist recently. She's  tried percogesic in the past, and started melatonin last week. She was previously on seroquel, restoril, and ambien. She's also been on trazodone but had to be on a higher dose, which worked for a while.   She had a normal liver test on July 23rd.   Patient Active Problem List   Diagnosis Date Noted   Nausea    Acute kidney injury ([place]) 02/19/2015   Left flank pain 02/19/2015   DKA (diabetic ketoacidoses) ([place]) 10/24/2014   HLD (hyperlipidemia) 10/24/2014   Hx pulmonary embolism 09/28/2014   Nausea vomiting and diarrhea 09/03/2014   Abdominal pain 09/03/2014   Abnormal LFTs 09/03/2014   Dizziness 09/03/2014   Acute pulmonary embolism ([place]) 09/03/2014   Transaminitis 09/02/2014   GAD (generalized anxiety disorder) 11/23/2013   PTSD (post-traumatic stress disorder) 11/23/2013   Severe major depression without psychotic features ([place]) 10/10/2013   Major depression ([place]) 05/22/2013   Chest pain 05/18/2012   Endometriosis 12/08/2011   Arthritis of left knee 12/08/2011   Diabetic retinopathy ([place]) 12/08/2011   Fibromyalgia 10/23/2011   DM type 1 (diabetes mellitus, type 1) ([place]) 10/13/2011   Past Medical History:  Diagnosis Date   Abdominal pain    Anxiety    Arthritis    Biliary dyskinesia    Cervical strain    Closed head injury 2010   Depression    Fibromyalgia    Hyperlipidemia    Hypoglycemia  Joint pain    Major depressive disorder ([place])    Migraine    hx of none recent   Nausea & vomiting    Post traumatic stress disorder (PTSD)    Pulmonary embolism ([facility])    Renal stones    Skin rash    due to medications   Sleep trouble    Well controlled type 1 diabetes mellitus with peripheral neuropathy [facility])    Past Surgical History:  Procedure Laterality Date   ABDOMINAL HYSTERECTOMY  2001   [place]  02/10/2011   Procedure: LAPAROSCOPIC CHOLECYSTECTOMY WITH  INTRAOPERATIVE CHOLANGIOGRAM;  Surgeon: Judieth Keens, DO;  Location: [place];  Service: General;  Laterality: N/A;   exploratory laparomty  Mar 04, 2012   EYE SURGERY  2006   laser eye surgery left eye   KNEE SURGERY Left 2012   LAPAROSCOPIC APPENDECTOMY N/A 10/12/2012   Procedure: DIAGNOSTIC APPENDECTOMY LAPAROSCOPIC;  Surgeon: Madilyn Hook, DO;  Location: [facility];  Service: General;  Laterality: N/A;   lipoma removed  yrs ago   ROBOTIC [place] N/A 03/04/2012   Procedure: ROBOTIC ASSISTED LAPAROSCOPIC LYSIS OF EXTENSIVE ADHESIONS;  Surgeon: Claiborne Billings A. Pamala Hurry, MD;  Location: [place];  Service: Gynecology;  Laterality: N/A;   TONSILLECTOMY  2001   Allergies  Allergen Reactions   Cephalexin Rash    Pt was admitted to the hospital upon taking.   Ibuprofen Nausea And Vomiting and Rash   Meloxicam Nausea Only   Prior to Admission medications   Medication Sig Start Date End Date Taking? Authorizing Provider  acetaminophen (TYLENOL) 500 MG tablet Take 500 mg by mouth every 6 (six) hours as needed for mild pain. Reported on 01/23/2015   Yes Historical Provider, MD  aspirin 81 MG tablet Take 81 mg by mouth daily.   Yes Historical Provider, MD  atorvastatin (LIPITOR) 40 MG tablet Take 1 tablet (40 mg total) by mouth daily. 06/28/15  Yes Elby Beck, FNP  insulin aspart (NOVOLOG) 100 UNIT/ML injection Inject 8 Units into the skin 3 (three) times daily with meals. 10/16/13  Yes Kerrie Buffalo, NP  insulin glargine (LANTUS) 100 UNIT/ML injection Inject 26 Units into the skin at bedtime.    Yes Historical Provider, MD  LANTUS SOLOSTAR 100 UNIT/ML Solostar Pen INJECT 25 UNITS SUBCUTANEOUSLY ONCE DAILY 03/24/15  Yes Historical Provider, MD  clonazePAM (KLONOPIN) 0.5 MG tablet TAKE 1 TABLET BY MOUTH AT BEDTIME AS NEEDED FOR ANXIETY Patient not taking: Reported on 08/17/2015 07/27/15   Dorian Heckle English, PA  prochlorperazine (COMPAZINE) 10 MG tablet Take 1 tablet  (10 mg total) by mouth every 8 (eight) hours as needed for nausea or vomiting (Nausea). Patient not taking: Reported on 08/17/2015 08/11/15   Sharlett Iles, MD  ranitidine (ZANTAC) 150 MG tablet Take 1 tablet (150 mg total) by mouth 2 (two) times daily. 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  Before going back to school, she was a Research scientist (physical sciences) at [employer].  Stop working when she was diagnosed with endometriosis and had a TIA.   Review of Systems  Constitutional: Positive for fatigue. Negative for chills and fever.  Gastrointestinal: Negative for nausea and vomiting.  Musculoskeletal: Positive for arthralgias and myalgias. Negative for gait problem.  Skin: Positive for wound. Negative for rash.  Neurological: Negative for weakness.  Psychiatric/Behavioral: Positive for sleep disturbance. The patient is nervous/anxious.        Objective:   Physical Exam  Constitutional: She is oriented to person, place, and time. She appears well-developed and well-nourished. No distress.  HENT:  Head: Normocephalic and atraumatic.  Eyes: EOM are normal. Pupils are equal, round, and reactive to light.  Neck: Neck supple.  Cardiovascular: Normal rate.   Pulmonary/Chest: Effort normal. No respiratory distress.  Musculoskeletal: Normal range of motion.  Small healing abrasion below the patelllar on the right knee, skin otherwise intact; no erythema, no ecchymosis, no appreciable  effusion; she has full extension, minimal decreased flexion on right knee due to terminal flexion; slight tenderness on the anterior aspect medial jointline, pain with mcmurray's on internal and external rotation with knee flexion, negative lachman test, negative valgus and varus stress, NVI distally  Neurological: She is alert and oriented to person, place, and time.  Skin: Skin is warm and dry.  Psychiatric: She has a normal mood and affect. Her behavior is normal.  Nursing note and vitals reviewed.   Vitals:   08/17/15 1603  BP: 118/74  Pulse: 89  Resp: 18  Temp: 98.8 F (37.1 C)  TempSrc: Oral  SpO2: 99%  Weight: 169 lb 9.6 oz (76.9 kg)  Height: 5\' 4"  (1.626 m)      Assessment & Plan:     Maureen [name] is a 45 y.o. female Insomnia - Plan: traZODone (DESYREL) 50 MG tablet, Ambulatory referral to Sleep Studies  - longstanding insomnia. Has tried multiple medications in the past. Did not have significant symptom relief with Klonopin, and would like to stop this medication as well as other avoid other benzodiazepines at this time. Appears to be problem with sleep onset, and possible anxiety component.  -Will try low-dose trazodone at bedtime, refer to sleep studies for evaluation, but may need psychiatry or counseling for further treatment. Follow-up within 1 month to discuss further.  Right knee pain ,contusion of right knee, initial encounter - Plan: Ambulatory referral to Orthopedic Surgery  - Suspected contusion without concerning findings on x-ray nor effusion on exam. Less likely meniscal injury, but with persistent pain, and at her request, will refer to orthopedics. Okay to continue bracing as needed, other symptomatic care as an after visit summary, RTC precautions.  Meds ordered this encounter  Medications   traZODone (DESYREL) 50 MG tablet    Sig: Take 0.5-1 tablets (25-50 mg total) by mouth at bedtime as needed for sleep.    Dispense:  30 tablet    Refill:  0    Patient Instructions   For your knee, okay to apply ice if needed, wear the brace as recommended from the emergency room last night. I did place a referral to Dr. French Ana. Avoid direct pressure to the front of the knee until you are evaluated by orthopedics.  For your trouble sleeping, I did refer you to a sleep specialist. You can  try one half to one of the trazodone at bedtime to see if this helps with sleep. See other information below regarding insomnia. Follow-up within the next 1 month to discuss this further.  Return to the clinic or go to the nearest emergency room if any of your symptoms worsen or new symptoms occur.  Insomnia Insomnia is a sleep disorder that makes it difficult to fall asleep or to stay asleep. Insomnia can cause tiredness (fatigue), low energy, difficulty concentrating, mood swings, and poor performance at work or school.  There are three different ways to classify insomnia:  Difficulty falling asleep.  Difficulty staying asleep.  Waking up too early in the morning. Any type of insomnia can be long-term (chronic) or short-term (acute). Both are common. Short-term insomnia usually lasts for three months or less. Chronic insomnia occurs at least three times a week for longer than three months. CAUSES  Insomnia may be caused by another condition, situation, or substance, such as:  Anxiety.  Certain medicines.  Gastroesophageal reflux disease (GERD) or other gastrointestinal conditions.  Asthma or other breathing conditions.  Restless legs syndrome, sleep apnea, or other sleep disorders.  Chronic pain.  Menopause. This may include hot flashes.  Stroke.  Abuse of alcohol, tobacco, or illegal drugs.  Depression.  Caffeine.   Neurological disorders, such as Alzheimer disease.  An overactive thyroid (hyperthyroidism). The cause of insomnia may not be known. RISK FACTORS Risk factors for insomnia include:  Gender. Women are more commonly  affected than men.  Age. Insomnia is more common as you get older.  Stress. This may involve your professional or personal life.  Income. Insomnia is more common in people with lower income.  Lack of exercise.   Irregular work schedule or night shifts.  Traveling between different time zones. SIGNS AND SYMPTOMS If you have insomnia, trouble falling asleep or trouble staying asleep is the main symptom. This may lead to other symptoms, such as:  Feeling fatigued.  Feeling nervous about going to sleep.  Not feeling rested in the morning.  Having trouble concentrating.  Feeling irritable, anxious, or depressed. TREATMENT  Treatment for insomnia depends on the cause. If your insomnia is caused by an underlying condition, treatment will focus on addressing the condition. Treatment may also include:   Medicines to help you sleep.  Counseling or therapy.  Lifestyle adjustments. HOME CARE INSTRUCTIONS   Take medicines only as directed by your health care provider.  Keep regular sleeping and waking hours. Avoid naps.  Keep a sleep diary to help you and your health care provider figure out what could be causing your insomnia. Include:   When you sleep.  When you wake up during the night.  How well you sleep.   How rested you feel the next day.  Any side effects of medicines you are taking.  What you eat and drink.   Make your bedroom a comfortable place where it is easy to fall asleep:  Put up shades or special blackout curtains to block light from outside.  Use a white noise machine to block noise.  Keep the temperature cool.   Exercise regularly as directed by your health care provider. Avoid exercising right before bedtime.  Use relaxation techniques to manage stress. Ask your health care provider to suggest some techniques that may work well for you. These may include:  Breathing exercises.  Routines to release muscle tension.  Visualizing peaceful  scenes.  Cut back on alcohol, caffeinated beverages, and cigarettes, especially close  to bedtime. These can disrupt your sleep.  Do not overeat or eat spicy foods right before bedtime. This can lead to digestive discomfort that can make it hard for you to sleep.  Limit screen use before bedtime. This includes:  Watching TV.  Using your smartphone, tablet, and computer.  Stick to a routine. This can help you fall asleep faster. Try to do a quiet activity, brush your teeth, and go to bed at the same time each night.  Get out of bed if you are still awake after 15 minutes of trying to sleep. Keep the lights down, but try reading or doing a quiet activity. When you feel sleepy, go back to bed.  Make sure that you drive carefully. Avoid driving if you feel very sleepy.  Keep all follow-up appointments as directed by your health care provider. This is important. SEEK MEDICAL CARE IF:   You are tired throughout the day or have trouble in your daily routine due to sleepiness.  You continue to have sleep problems or your sleep problems get worse. SEEK IMMEDIATE MEDICAL CARE IF:   You have serious thoughts about hurting yourself or someone else.   This information is not intended to replace advice given to you by your health care provider. Make sure you discuss any questions you have with your health care provider.   Document Released: 01/03/2000 Document Revised: 09/26/2014 Document Reviewed: 10/06/2013 Elsevier Interactive Patient Education 2016 [employer].     IF you received an x-ray today, you will receive an invoice from [facility]. Please contact [facility] at [phone] with questions or concerns regarding your invoice.   IF you received labwork today, you will receive an invoice from [facility]. Please contact [facility] at [phone] with questions or concerns regarding your invoice.   Our billing staff will not be able to  assist you with questions regarding bills from these companies.  You will be contacted with the lab results as soon as they are available. The fastest way to get your results is to activate your My Chart account. Instructions are located on the last page of this paperwork. If you have not heard from [place] regarding the results in 2 weeks, please contact this office.        I personally performed the services described in this documentation, which was scribed in my presence. The recorded information has been reviewed and considered, and addended by me as needed.   Signed,   Merri Ray, MD [facility].  08/17/15 5:21 PM

## 2015-09-02 DIAGNOSIS — B0229 Other postherpetic nervous system involvement: Secondary | ICD-10-CM | POA: Diagnosis not present

## 2015-09-02 DIAGNOSIS — M47817 Spondylosis without myelopathy or radiculopathy, lumbosacral region: Secondary | ICD-10-CM | POA: Diagnosis not present

## 2015-09-02 DIAGNOSIS — G894 Chronic pain syndrome: Secondary | ICD-10-CM | POA: Diagnosis not present

## 2015-09-02 DIAGNOSIS — E1142 Type 2 diabetes mellitus with diabetic polyneuropathy: Secondary | ICD-10-CM | POA: Diagnosis not present

## 2015-09-02 DIAGNOSIS — Z79891 Long term (current) use of opiate analgesic: Secondary | ICD-10-CM | POA: Diagnosis not present

## 2015-09-02 IMAGING — CT CT HEAD W/O CM
2 series · 16 of 30 positions shown, 20 images · non-contrast
Comparison: None.

CLINICAL DATA: Headache, hyperglycemia

EXAM:
CT HEAD WITHOUT CONTRAST
TECHNIQUE: Contiguous axial images were obtained from the base of the skull
through the vertex without intravenous contrast.

[Series 2: head w/o · axial · non-contrast · 0.45mm/px · z∈[-150,-30]mm · 13 of 29 slices shown, 17 images]
[im 3/29  brain]
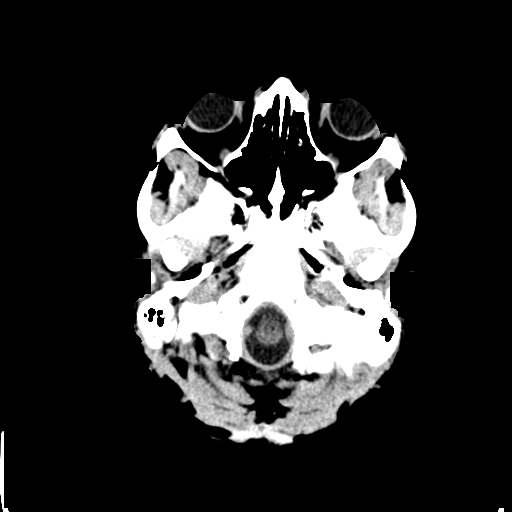
[im 3/29  bone]
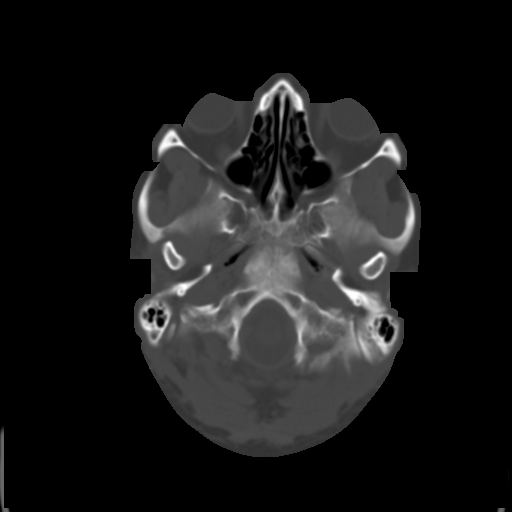
[im 5/29  brain]
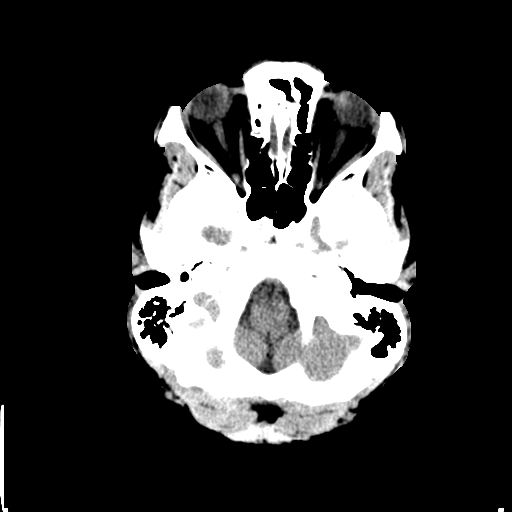
[im 7/29  brain]
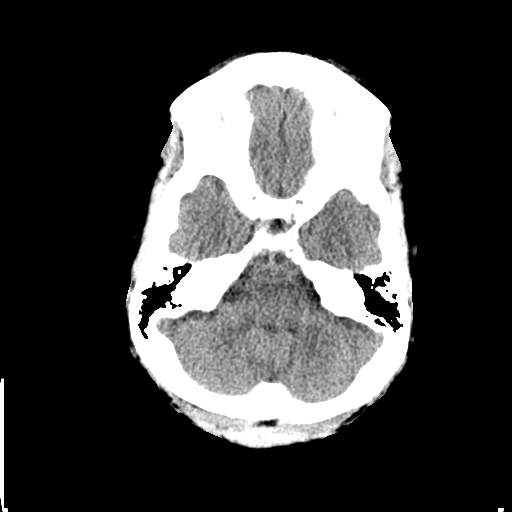
[im 9/29  brain]
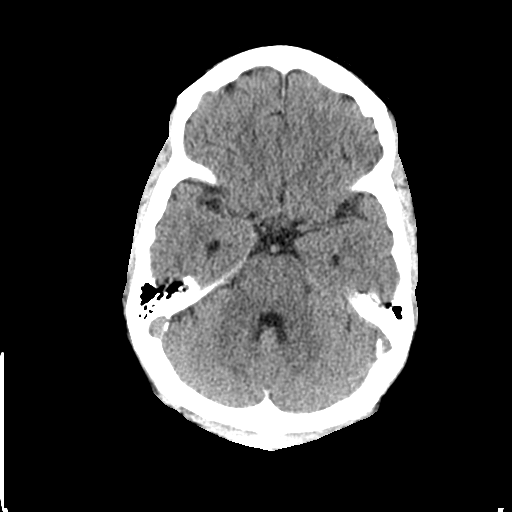
[im 11/29  brain]
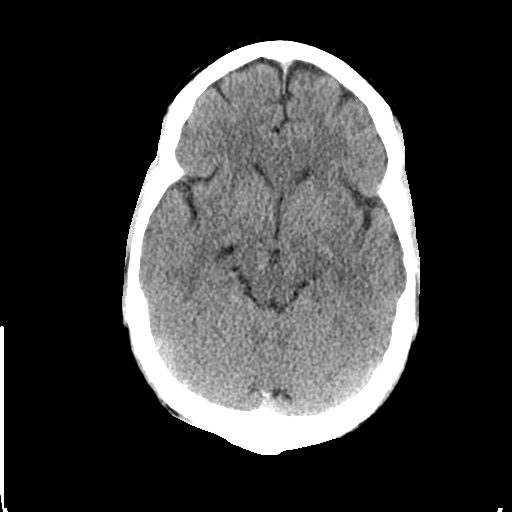
[im 11/29  bone]
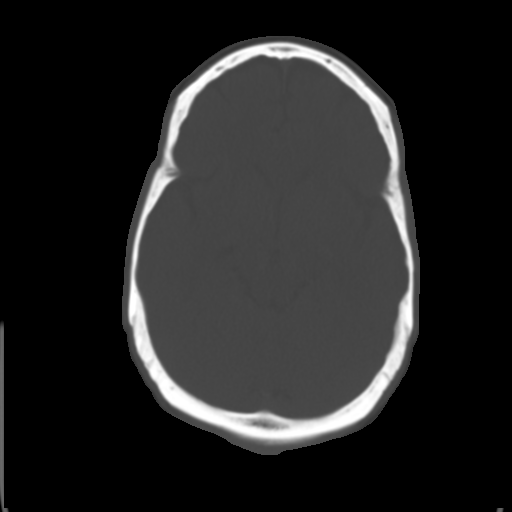
[im 13/29  brain]
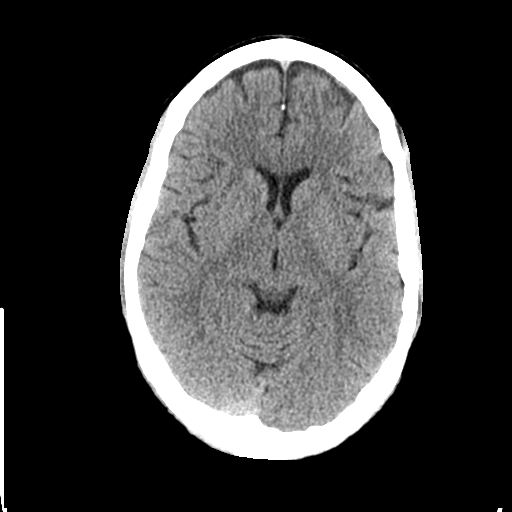
[im 15/29  brain]
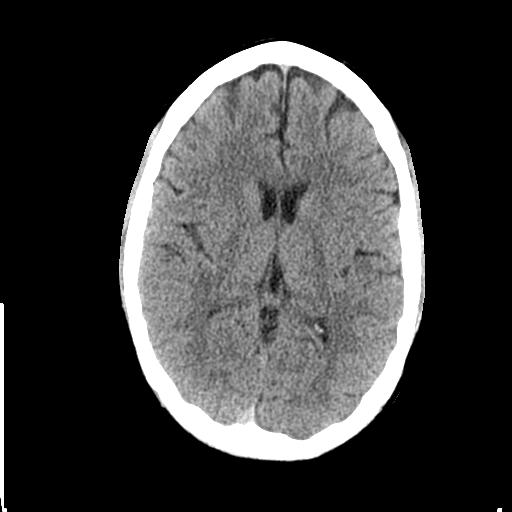
[im 17/29  brain]
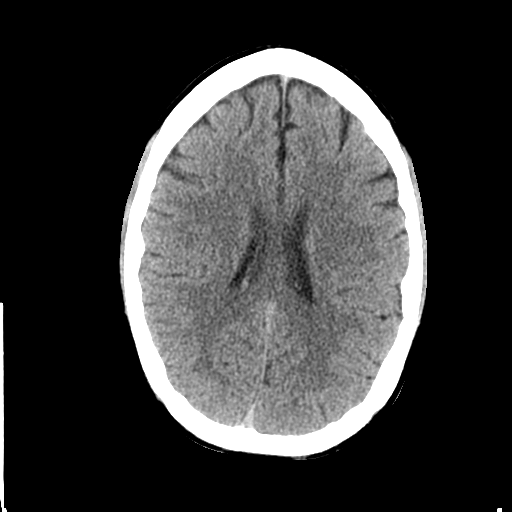
[im 19/29  brain]
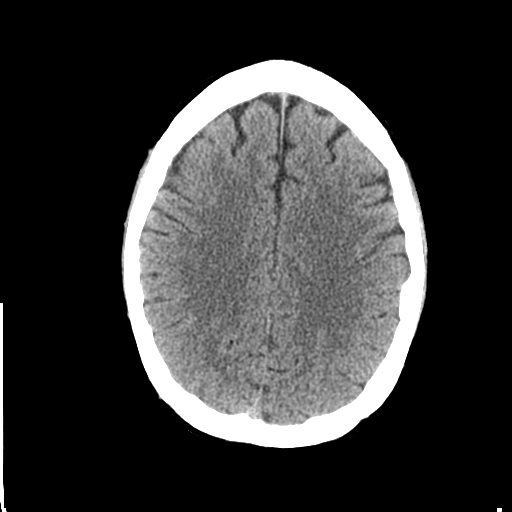
[im 19/29  bone]
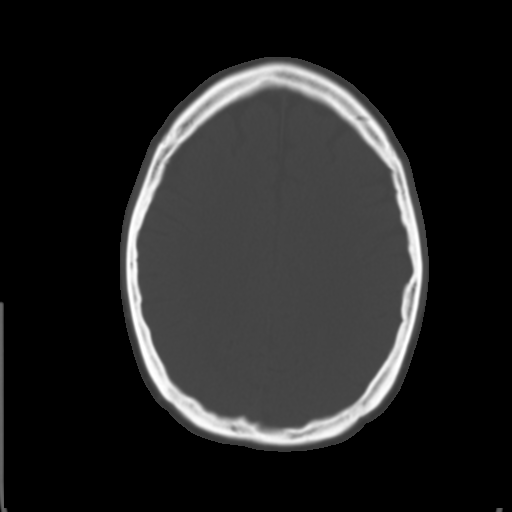
[im 21/29  brain]
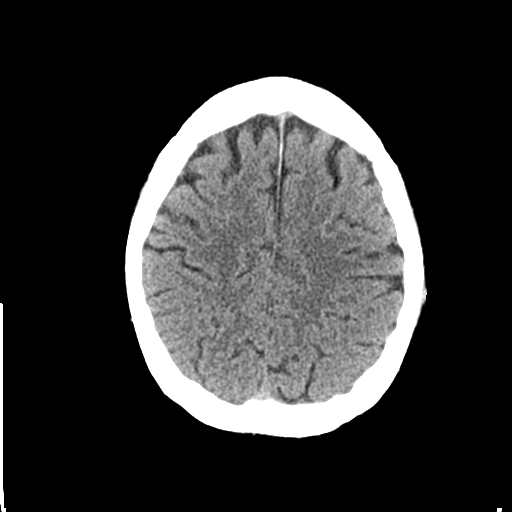
[im 23/29  brain]
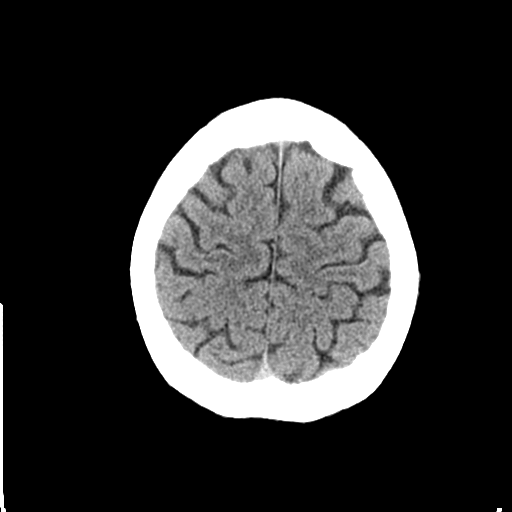
[im 25/29  brain]
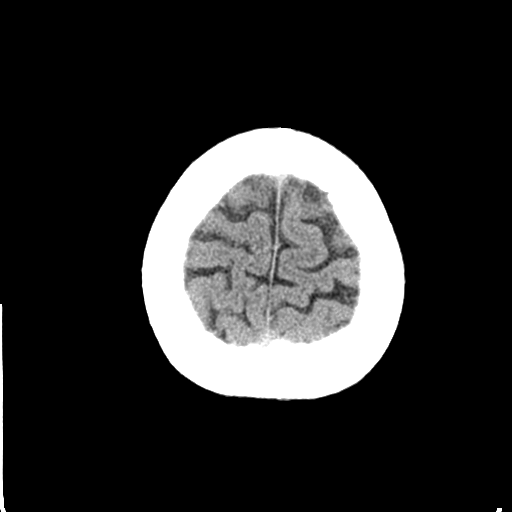
[im 27/29  brain]
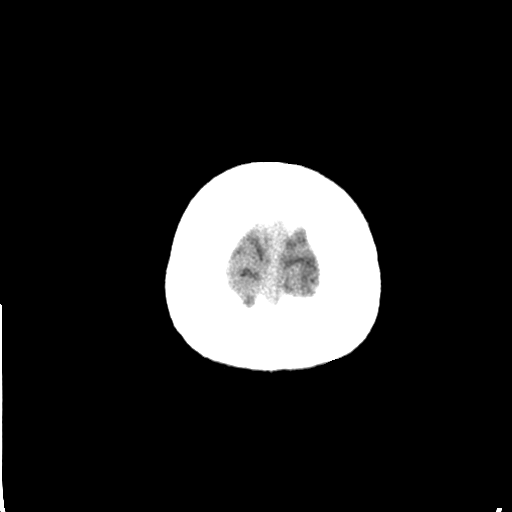
[im 27/29  bone]
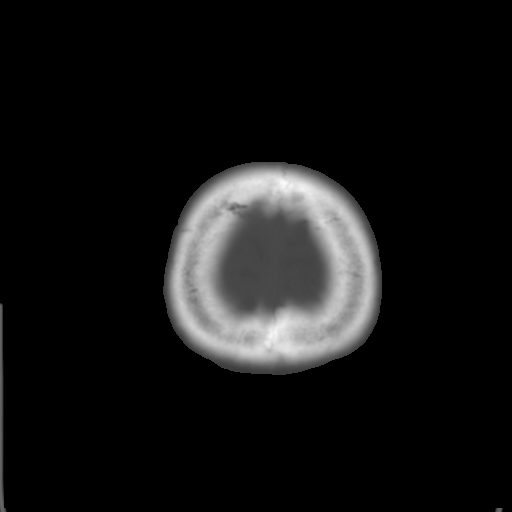

[Series 3: bone windows · axial · 0.45mm/px · z∈[-150,-110]mm · 3 of 29 slices shown]
[im 3/29  bone]
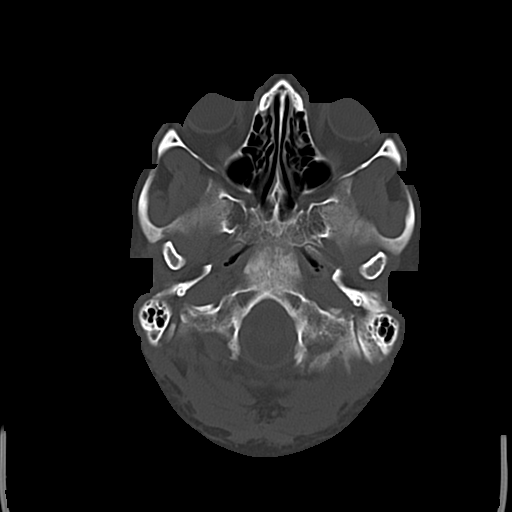
[im 7/29  bone]
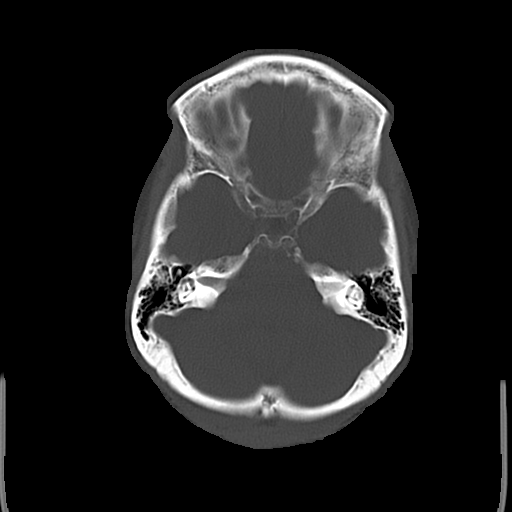
[im 11/29  bone]
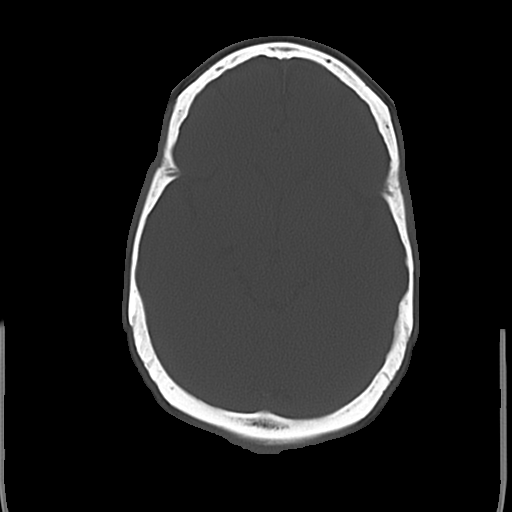

[16 of 30 positions shown; findings below may reference images not displayed]

FINDINGS: There is no evidence of mass effect, midline shift or extra-axial
fluid collections. There is no evidence of a space-occupying lesion
or intracranial hemorrhage. There is no evidence of a cortical-based
area of acute infarction.

The ventricles and sulci are appropriate for the patient's age. The
basal cisterns are patent.

Visualized portions of the orbits are unremarkable. The visualized
portions of the paranasal sinuses and mastoid air cells are
unremarkable.

The osseous structures are unremarkable.
IMPRESSION: No acute intracranial pathology.

## 2015-09-13 ENCOUNTER — Encounter (HOSPITAL_BASED_OUTPATIENT_CLINIC_OR_DEPARTMENT_OTHER): Payer: Self-pay | Admitting: *Deleted

## 2015-09-13 ENCOUNTER — Emergency Department (HOSPITAL_BASED_OUTPATIENT_CLINIC_OR_DEPARTMENT_OTHER)
Admission: EM | Admit: 2015-09-13 | Discharge: 2015-09-14 | Disposition: A | Discharge status: Home / Self Care | Payer: BLUE CROSS/BLUE SHIELD | Attending: Emergency Medicine | Admitting: Emergency Medicine

## 2015-09-13 DIAGNOSIS — Z7982 Long term (current) use of aspirin: Secondary | ICD-10-CM | POA: Insufficient documentation

## 2015-09-13 DIAGNOSIS — R739 Hyperglycemia, unspecified: Secondary | ICD-10-CM

## 2015-09-13 DIAGNOSIS — E1065 Type 1 diabetes mellitus with hyperglycemia: Secondary | ICD-10-CM | POA: Diagnosis not present

## 2015-09-13 DIAGNOSIS — Z791 Long term (current) use of non-steroidal anti-inflammatories (NSAID): Secondary | ICD-10-CM | POA: Insufficient documentation

## 2015-09-13 DIAGNOSIS — R1084 Generalized abdominal pain: Secondary | ICD-10-CM | POA: Diagnosis present

## 2015-09-13 LAB — CBC WITH DIFFERENTIAL/PLATELET
Basophils Absolute: 0.1 10*3/uL (ref 0.0–0.1)
Basophils Relative: 0 %
Eosinophils Absolute: 0.3 10*3/uL (ref 0.0–0.7)
Eosinophils Relative: 2 %
HCT: 47 % — ABNORMAL HIGH (ref 36.0–46.0)
Hemoglobin: 16.6 g/dL — ABNORMAL HIGH (ref 12.0–15.0)
Lymphocytes Relative: 19 %
Lymphs Abs: 2.4 10*3/uL (ref 0.7–4.0)
MCH: 32.2 pg (ref 26.0–34.0)
MCHC: 35.3 g/dL (ref 30.0–36.0)
MCV: 91.1 fL (ref 78.0–100.0)
Monocytes Absolute: 0.6 10*3/uL (ref 0.1–1.0)
Monocytes Relative: 5 %
Neutro Abs: 9.1 10*3/uL — ABNORMAL HIGH (ref 1.7–7.7)
Neutrophils Relative %: 74 %
Platelets: 313 10*3/uL (ref 150–400)
RBC: 5.16 MIL/uL — ABNORMAL HIGH (ref 3.87–5.11)
RDW: 11.8 % (ref 11.5–15.5)
WBC: 12.4 10*3/uL — ABNORMAL HIGH (ref 4.0–10.5)

## 2015-09-13 LAB — COMPREHENSIVE METABOLIC PANEL
ALT: 48 U/L (ref 14–54)
AST: 32 U/L (ref 15–41)
Albumin: 4.3 g/dL (ref 3.5–5.0)
Alkaline Phosphatase: 165 U/L — ABNORMAL HIGH (ref 38–126)
Anion gap: 11 (ref 5–15)
BUN: 10 mg/dL (ref 6–20)
CO2: 27 mmol/L (ref 22–32)
Calcium: 10 mg/dL (ref 8.9–10.3)
Chloride: 99 mmol/L — ABNORMAL LOW (ref 101–111)
Creatinine, Ser: 0.91 mg/dL (ref 0.44–1.00)
GFR calc Af Amer: >60 mL/min (ref >60)
GFR calc non Af Amer: >60 mL/min (ref >60)
Glucose, Bld: 288 mg/dL — ABNORMAL HIGH (ref 65–99)
Potassium: 4.3 mmol/L (ref 3.5–5.1)
Sodium: 137 mmol/L (ref 135–145)
Total Bilirubin: 0.9 mg/dL (ref 0.3–1.2)
Total Protein: 8.8 g/dL — ABNORMAL HIGH (ref 6.5–8.1)

## 2015-09-13 LAB — URINALYSIS, ROUTINE W REFLEX MICROSCOPIC
Bilirubin Urine: NEGATIVE
Glucose, UA: >1000 mg/dL — AB
Hgb urine dipstick: NEGATIVE
Ketones, ur: NEGATIVE mg/dL
Leukocytes, UA: NEGATIVE
Nitrite: NEGATIVE
Protein, ur: NEGATIVE mg/dL
Specific Gravity, Urine: 1.037 — ABNORMAL HIGH (ref 1.005–1.030)
pH: 5.5 (ref 5.0–8.0)

## 2015-09-13 LAB — URINE MICROSCOPIC-ADD ON

## 2015-09-13 LAB — CBG MONITORING, ED
Glucose-Capillary: 298 mg/dL — ABNORMAL HIGH (ref 65–99)
Glucose-Capillary: 240 mg/dL — ABNORMAL HIGH (ref 65–99)
Glucose-Capillary: 168 mg/dL — ABNORMAL HIGH (ref 65–99)

## 2015-09-13 LAB — LIPASE, BLOOD: Lipase: 15 U/L (ref 11–51)

## 2015-09-13 MED ORDER — METOCLOPRAMIDE HCL 5 MG/ML IJ SOLN
10.0000 mg | Freq: Once | INTRAMUSCULAR | Status: AC
  Administered 2015-09-13: 10 mg via INTRAVENOUS
  Filled 2015-09-13: qty 2

## 2015-09-13 MED ORDER — ONDANSETRON HCL 4 MG/2ML IJ SOLN
4.0000 mg | Freq: Once | INTRAMUSCULAR | Status: AC
Start: 2015-09-13 — End: 2015-09-13
  Administered 2015-09-13: 4 mg via INTRAVENOUS
  Filled 2015-09-13: qty 2

## 2015-09-13 MED ORDER — INSULIN GLARGINE 100 UNIT/ML ~~LOC~~ SOLN
SUBCUTANEOUS | Status: AC
  Filled 2015-09-13: qty 1

## 2015-09-13 MED ORDER — SODIUM CHLORIDE 0.9 % IV BOLUS (SEPSIS)
1000.0000 mL | Freq: Once | INTRAVENOUS | Status: AC
  Administered 2015-09-13: 1000 mL via INTRAVENOUS

## 2015-09-13 MED ORDER — INSULIN GLARGINE 100 UNIT/ML ~~LOC~~ SOLN
26.0000 [IU] | Freq: Once | SUBCUTANEOUS | Status: AC
  Administered 2015-09-13: 26 [IU] via SUBCUTANEOUS

## 2015-09-13 NOTE — ED Notes (Signed)
CBG is 240

## 2015-09-13 NOTE — ED Provider Notes (Signed)
[facility] DEPT [facility] Provider Note   CSN: [id number] Arrival date & time: 09/13/15  1655  By signing my name below, I, Maureen [name], attest that this documentation has been prepared under the direction and in the presence of [facility], Maureen [name]. Electronically Signed: Judithann [name], ED Scribe. 09/13/15. 6:30 PM.    History   Chief Complaint Chief Complaint  Patient presents with  . Hyperglycemia   HPI Comments: Maureen [name] is a 45 y.o. female with a hx of DM I, DKA, diabetic retinopathy, HLD, and arthritis who presents to the Emergency Department requesting evaluation for hyperglycemia (highest at 254 PTA) onset today. She reports associated generalized abdominal pain and nausea. She explains that she is here to make sure that she does not go into DKA. No alleviating factors noted. Taking insulin as directed -currently on 26 units of Lantus and 8 units of Novolog before meals. She denies any recent steroid use, recent illness. No fever, chills, or vomiting. Hx of chronic nausea and abdominal pain, per PCP chart review possibly gastroparesis - has been referred to GI.   Currently looking for a new PCP since her old one retired but has refills on insulin medications.   The history is provided by the patient. No language interpreter was used.    Past Medical History:  Diagnosis Date  . Abdominal pain   . Anxiety   . Arthritis   . Biliary dyskinesia   . Cervical strain   . Closed head injury 2010  . Depression   . Fibromyalgia   . Hyperlipidemia   . Hypoglycemia   . Joint pain   . Major depressive disorder ([place])   . Migraine    hx of none recent  . Nausea & vomiting   . Post traumatic stress disorder (PTSD)   . Pulmonary embolism ([place])   . Renal stones   . Skin rash    due to medications  . Sleep trouble   . Well controlled type 1 diabetes mellitus with peripheral neuropathy [facility])     Patient Active Problem List   Diagnosis Date Noted  . Nausea   .  Acute kidney injury ([place]) 02/19/2015  . Left flank pain 02/19/2015  . DKA (diabetic ketoacidoses) ([place]) 10/24/2014  . HLD (hyperlipidemia) 10/24/2014  . Hx pulmonary embolism 09/28/2014  . Nausea vomiting and diarrhea 09/03/2014  . Abdominal pain 09/03/2014  . Abnormal LFTs 09/03/2014  . Dizziness 09/03/2014  . Acute pulmonary embolism ([place]) 09/03/2014  . Transaminitis 09/02/2014  . GAD (generalized anxiety disorder) 11/23/2013  . PTSD (post-traumatic stress disorder) 11/23/2013  . Severe major depression without psychotic features ([place]) 10/10/2013  . Major depression ([place]) 05/22/2013  . Chest pain 05/18/2012  . Endometriosis 12/08/2011  . Arthritis of left knee 12/08/2011  . Diabetic retinopathy ([place]) 12/08/2011  . Fibromyalgia 10/23/2011  . DM type 1 (diabetes mellitus, type 1) ([place]) 10/13/2011    Past Surgical History:  Procedure Laterality Date  . ABDOMINAL HYSTERECTOMY  2001  . APPENDECTOMY    . [place]  . CHOLECYSTECTOMY  02/10/2011   Procedure: LAPAROSCOPIC CHOLECYSTECTOMY WITH INTRAOPERATIVE CHOLANGIOGRAM;  Surgeon: Judieth Keens, DO;  Location: [facility];  Service: General;  Laterality: N/A;  . exploratory laparomty  Mar 04, 2012  . EYE SURGERY  2006   laser eye surgery left eye  . KNEE SURGERY Left 2012  . LAPAROSCOPIC APPENDECTOMY N/A 10/12/2012   Procedure: DIAGNOSTIC APPENDECTOMY LAPAROSCOPIC;  Surgeon: Madilyn Hook, DO;  Location: [facility];  Service: General;  Laterality: N/A;  . lipoma removed  yrs ago  . ROBOTIC ASSISTED LAPAROSCOPIC LYSIS OF ADHESION N/A 03/04/2012   Procedure: ROBOTIC ASSISTED LAPAROSCOPIC LYSIS OF EXTENSIVE ADHESIONS;  Surgeon: Claiborne Billings A. Pamala Hurry, MD;  Location: [facility];  Service: Gynecology;  Laterality: N/A;  . TONSILLECTOMY  2001    OB History    No data available       Home Medications    Prior to Admission medications   Medication Sig Start Date End Date Taking? Authorizing Provider  atorvastatin (LIPITOR) 40  MG tablet Take 40 mg by mouth daily.   Yes Historical Provider, MD  insulin aspart (NOVOLOG) 100 UNIT/ML injection Inject 8 Units into the skin 3 (three) times daily with meals. 10/16/13  Yes Kerrie Buffalo, NP  insulin glargine (LANTUS) 100 UNIT/ML injection Inject 26 Units into the skin at bedtime.    Yes Historical Provider, MD  LANTUS SOLOSTAR 100 UNIT/ML Solostar Pen INJECT 25 UNITS SUBCUTANEOUSLY ONCE DAILY 03/24/15  Yes Historical Provider, MD  aspirin 81 MG tablet Take 81 mg by mouth daily.    Historical Provider, MD  metoCLOPramide (REGLAN) 10 MG tablet Take 1 tablet (10 mg total) by mouth every 8 (eight) hours as needed for nausea. 09/14/15   Sharpes, Maureen [name]    Family History Family History  Problem Relation Age of Onset  . Cancer Father     lymphoma  . Nephrolithiasis Father   . Vascular Disease Father   . Alcohol abuse Father   . Drug abuse Father   . Cancer Maternal Grandmother     colon  . Hypertension Mother   . Heart disease Mother   . Cataracts Mother   . Depression Mother   . Diabetes Brother   . Drug abuse Brother   . Mental illness Maternal Grandfather   . Cancer Maternal Grandfather   . Heart disease Paternal Grandmother   . Heart disease Paternal Grandfather   . Suicidality Maternal Uncle     Social History Social History  Substance Use Topics  . Smoking status: Never Smoker  . Smokeless tobacco: Never Used  . Alcohol use No     Allergies   Cephalexin; Ibuprofen; and Meloxicam   Review of Systems Review of Systems  Constitutional: Negative for chills and fever.  HENT: Negative for congestion.   Eyes: Negative for visual disturbance.  Respiratory: Negative for cough and shortness of breath.   Cardiovascular: Negative.   Gastrointestinal: Positive for abdominal pain and nausea. Negative for vomiting.  Endocrine: Negative for polydipsia and polyuria.  Genitourinary: Negative for dysuria.  Musculoskeletal: Negative for back pain.    Neurological: Negative for headaches.     Physical Exam Updated Vital Signs BP 145/69 (BP Location: Left Arm)   Pulse 91   Temp 98.6 F (37 C) (Oral)   Resp 20   SpO2 100%   Physical Exam  Constitutional: She is oriented to person, place, and time. She appears well-developed and well-nourished. No distress.  Non-toxic appearing  HENT:  Head: Normocephalic and atraumatic.  Cardiovascular: Normal rate, regular rhythm, normal heart sounds and intact distal pulses.  Exam reveals no gallop and no friction rub.   No murmur heard. Pulmonary/Chest: Effort normal and breath sounds normal. No respiratory distress. She has no wheezes. She has no rales. She exhibits no tenderness.  Abdominal: Soft. Bowel sounds are normal. She exhibits no distension. There is tenderness. There is no rebound and no guarding.  Generalized abdominal tenderness  Musculoskeletal: She  exhibits no edema.  Neurological: She is alert and oriented to person, place, and time.  Skin: Skin is warm and dry.  Nursing note and vitals reviewed.    ED Treatments / Results  DIAGNOSTIC STUDIES: Oxygen Saturation is 100% on RA, normal by my interpretation.    COORDINATION OF CARE: 6:29 PM- Pt advised of plan for treatment and pt agrees. Pt informed of her lab results. She will receive IV fluids.    Labs (all labs ordered are listed, but only abnormal results are displayed) Labs Reviewed  URINALYSIS, ROUTINE W REFLEX MICROSCOPIC (NOT AT [facility]) - Abnormal; Notable for the following:       Result Value   Specific Gravity, Urine 1.037 (*)    Glucose, UA >1000 (*)    All other components within normal limits  URINE MICROSCOPIC-ADD ON - Abnormal; Notable for the following:    Squamous Epithelial / LPF 0-5 (*)    Bacteria, UA RARE (*)    All other components within normal limits  COMPREHENSIVE METABOLIC PANEL - Abnormal; Notable for the following:    Chloride 99 (*)    Glucose, Bld 288 (*)    Total Protein 8.8 (*)     Alkaline Phosphatase 165 (*)    All other components within normal limits  CBC WITH DIFFERENTIAL/PLATELET - Abnormal; Notable for the following:    WBC 12.4 (*)    RBC 5.16 (*)    Hemoglobin 16.6 (*)    HCT 47.0 (*)    Neutro Abs 9.1 (*)    All other components within normal limits  CBG MONITORING, ED - Abnormal; Notable for the following:    Glucose-Capillary 298 (*)    All other components within normal limits  CBG MONITORING, ED - Abnormal; Notable for the following:    Glucose-Capillary 240 (*)    All other components within normal limits  CBG MONITORING, ED - Abnormal; Notable for the following:    Glucose-Capillary 168 (*)    All other components within normal limits  LIPASE, BLOOD    EKG  EKG Interpretation None       Radiology No results found.  Procedures Procedures (including critical care time)  Medications Ordered in ED Medications  sodium chloride 0.9 % bolus 1,000 mL (0 mLs Intravenous Stopped 09/13/15 2022)  ondansetron (ZOFRAN) injection 4 mg (4 mg Intravenous Given 09/13/15 1916)  sodium chloride 0.9 % bolus 1,000 mL (0 mLs Intravenous Stopped 09/13/15 2213)  metoCLOPramide (REGLAN) injection 10 mg (10 mg Intravenous Given 09/13/15 2323)  insulin glargine (LANTUS) injection 26 Units (26 Units Subcutaneous Given 09/13/15 2322)     Initial Impression / Assessment and Plan / ED Course  Maureen Oyster, Maureen [name] has reviewed the triage vital signs and the nursing notes.  Pertinent labs & imaging results that were available during my care of the patient were reviewed by me and considered in my medical decision making (see chart for details).  Clinical Course   Maureen [name] is a 45 y.o. female with PMH of DM1 who presents with hyperglycemia of 298. Typically has very good glycemic control, stating this is very high value for her. No recent illness or steroid use. Will obtain labs and urine, start IV fluids, and continue to monitor.   UA with >1000 glucose, no  ketones, no signs of infection. Labs reviewed and reassuring. Anion gap 11, normal CO2. Not in DKA.  CBG after 1 L 240, next liter given.  10:48 PM - Patient reevaluated after 2nd liter  IVF. CBG down to 168. Typically takes home lantus, 26U, around 10pm. Will give home dose here. Patient still nauseous. Will give Reglan.   Patient has seen GI in the past for chronic abdominal pain, thought to be related to gastroparesis. Repeat abdominal exam with no peritoneal signs. Mild generalized abdominal tenderness. Patient tolerating PO and no episodes of emesis during stay. PCP resource guide attached to discharge summary. GI follow up strongly recommended and included in paperwork as well. Rx for reglan given for nausea. Reasons to return to ED discussed. All questions answered.   Final Clinical Impressions(s) / ED Diagnoses   Final diagnoses:  Hyperglycemia    New Prescriptions Discharge Medication List as of 09/14/2015 12:48 AM    START taking these medications   Details  metoCLOPramide (REGLAN) 10 MG tablet Take 1 tablet (10 mg total) by mouth every 8 (eight) hours as needed for nausea., Starting Sat 09/14/2015, Print       I personally performed the services described in this documentation, which was scribed in my presence. The recorded information has been reviewed and is accurate.    [facility] Keymon Mcelroy, Maureen [name] 09/14/15 0120    Charlesetta Shanks, MD 09/14/15 1414

## 2015-09-13 NOTE — ED Triage Notes (Signed)
C/o high blood sugar all day and has been as high as 310. Says her mouth feels dry and mid abd pain.

## 2015-09-13 NOTE — ED Notes (Signed)
CBG is 168. 

## 2015-09-14 MED ORDER — METOCLOPRAMIDE HCL 10 MG PO TABS: 10.0000 mg | ORAL_TABLET | Freq: Three times a day (TID) | ORAL | 0 refills | Status: DC | PRN

## 2015-09-14 NOTE — Discharge Instructions (Signed)
Take Reglan as needed for nausea. Continue taking insulin as directed.  Follow up with the GI clinic listed for persistent nausea, abdominal pain.   Please seek immediate care if you develop any of the following symptoms: The pain does not go away.  You have a fever.  You keep throwing up (vomiting).  You pass bloody or black tarry stools.  There is bright red blood in the stool.  There is rectal pain.  You do not seem to be getting better.  You have any questions or concerns.

## 2015-09-17 ENCOUNTER — Ambulatory Visit: Payer: BLUE CROSS/BLUE SHIELD

## 2015-09-17 ENCOUNTER — Emergency Department (HOSPITAL_BASED_OUTPATIENT_CLINIC_OR_DEPARTMENT_OTHER)
Admission: EM | Admit: 2015-09-17 | Discharge: 2015-09-17 | Disposition: A | Discharge status: Home / Self Care | Payer: BLUE CROSS/BLUE SHIELD | Attending: Emergency Medicine | Admitting: Emergency Medicine

## 2015-09-17 ENCOUNTER — Encounter (HOSPITAL_BASED_OUTPATIENT_CLINIC_OR_DEPARTMENT_OTHER): Payer: Self-pay | Admitting: *Deleted

## 2015-09-17 DIAGNOSIS — Z794 Long term (current) use of insulin: Secondary | ICD-10-CM | POA: Insufficient documentation

## 2015-09-17 DIAGNOSIS — Z79899 Other long term (current) drug therapy: Secondary | ICD-10-CM | POA: Diagnosis not present

## 2015-09-17 DIAGNOSIS — E109 Type 1 diabetes mellitus without complications: Secondary | ICD-10-CM | POA: Diagnosis not present

## 2015-09-17 DIAGNOSIS — M25531 Pain in right wrist: Secondary | ICD-10-CM | POA: Insufficient documentation

## 2015-09-17 DIAGNOSIS — Z7982 Long term (current) use of aspirin: Secondary | ICD-10-CM | POA: Insufficient documentation

## 2015-09-17 NOTE — ED Triage Notes (Signed)
C/o right wrist pain after sleeping on it during the night. Started yesterday. States hx of fx in that wrist. No obvious injury.

## 2015-09-17 NOTE — ED Provider Notes (Signed)
[facility] DEPT [facility] Provider Note   CSN: [id number] Arrival date & time: 09/17/15  [id number]     History   Chief Complaint Chief Complaint  Patient presents with  . Wrist Pain    HPI Maureen [name] is a 45 y.o. female.  The history is provided by the patient.  Wrist Pain  This is a new problem. The current episode started yesterday. The problem occurs constantly (fluctuates). The problem has not changed since onset.Pertinent negatives include no chest pain, no abdominal pain, no headaches and no shortness of breath. The symptoms are aggravated by bending and twisting. Nothing relieves the symptoms. Treatments tried: Phenocane (over the counter supplement) The treatment provided no relief.   Reports previously breaking her wrist 7-8 yrs ago. Reports sleeping on it the night prior to onset. No other trauma.   Past Medical History:  Diagnosis Date  . Abdominal pain   . Anxiety   . Arthritis   . Biliary dyskinesia   . Cervical strain   . Closed head injury 2010  . Depression   . Fibromyalgia   . Hyperlipidemia   . Hypoglycemia   . Joint pain   . Major depressive disorder ([place])   . Migraine    hx of none recent  . Nausea & vomiting   . Post traumatic stress disorder (PTSD)   . Pulmonary embolism ([place])   . Renal stones   . Skin rash    due to medications  . Sleep trouble   . Well controlled type 1 diabetes mellitus with peripheral neuropathy [facility])     Patient Active Problem List   Diagnosis Date Noted  . Nausea   . Acute kidney injury ([place]) 02/19/2015  . Left flank pain 02/19/2015  . DKA (diabetic ketoacidoses) ([place]) 10/24/2014  . HLD (hyperlipidemia) 10/24/2014  . Hx pulmonary embolism 09/28/2014  . Nausea vomiting and diarrhea 09/03/2014  . Abdominal pain 09/03/2014  . Abnormal LFTs 09/03/2014  . Dizziness 09/03/2014  . Acute pulmonary embolism ([place]) 09/03/2014  . Transaminitis 09/02/2014  . GAD (generalized anxiety disorder) 11/23/2013  . PTSD  (post-traumatic stress disorder) 11/23/2013  . Severe major depression without psychotic features ([place]) 10/10/2013  . Major depression ([place]) 05/22/2013  . Chest pain 05/18/2012  . Endometriosis 12/08/2011  . Arthritis of left knee 12/08/2011  . Diabetic retinopathy ([place]) 12/08/2011  . Fibromyalgia 10/23/2011  . DM type 1 (diabetes mellitus, type 1) ([place]) 10/13/2011    Past Surgical History:  Procedure Laterality Date  . ABDOMINAL HYSTERECTOMY  2001  . APPENDECTOMY    . [place]  . CHOLECYSTECTOMY  02/10/2011   Procedure: LAPAROSCOPIC CHOLECYSTECTOMY WITH INTRAOPERATIVE CHOLANGIOGRAM;  Surgeon: Judieth Keens, DO;  Location: [facility];  Service: General;  Laterality: N/A;  . exploratory laparomty  Mar 04, 2012  . EYE SURGERY  2006   laser eye surgery left eye  . KNEE SURGERY Left 2012  . LAPAROSCOPIC APPENDECTOMY N/A 10/12/2012   Procedure: DIAGNOSTIC APPENDECTOMY LAPAROSCOPIC;  Surgeon: Madilyn Hook, DO;  Location[facility];  Service: General;  Laterality: N/A;  . lipoma removed  yrs ago  . ROBOTIC ASSISTED LAPAROSCOPIC LYSIS OF ADHESION N/A 03/04/2012   Procedure: ROBOTIC ASSISTED LAPAROSCOPIC LYSIS OF EXTENSIVE ADHESIONS;  Surgeon: Claiborne Billings A. Pamala Hurry, MD;  Location: [facility];  Service: Gynecology;  Laterality: N/A;  . TONSILLECTOMY  2001    OB History    No data available       Home Medications    Prior to  Admission medications   Medication Sig Start Date End Date Taking? Authorizing Provider  aspirin 81 MG tablet Take 81 mg by mouth daily.   Yes Historical Provider, MD  atorvastatin (LIPITOR) 40 MG tablet Take 40 mg by mouth daily.   Yes Historical Provider, MD  insulin aspart (NOVOLOG) 100 UNIT/ML injection Inject 8 Units into the skin 3 (three) times daily with meals. 10/16/13  Yes Kerrie Buffalo, NP  insulin glargine (LANTUS) 100 UNIT/ML injection Inject 26 Units into the skin at bedtime.    Yes Historical Provider, MD  LANTUS SOLOSTAR 100 UNIT/ML Solostar  Pen INJECT 25 UNITS SUBCUTANEOUSLY ONCE DAILY 03/24/15  Yes Historical Provider, MD    Family History Family History  Problem Relation Age of Onset  . Cancer Father     lymphoma  . Nephrolithiasis Father   . Vascular Disease Father   . Alcohol abuse Father   . Drug abuse Father   . Cancer Maternal Grandmother     colon  . Hypertension Mother   . Heart disease Mother   . Cataracts Mother   . Depression Mother   . Diabetes Brother   . Drug abuse Brother   . Mental illness Maternal Grandfather   . Cancer Maternal Grandfather   . Heart disease Paternal Grandmother   . Heart disease Paternal Grandfather   . Suicidality Maternal Uncle     Social History Social History  Substance Use Topics  . Smoking status: Never Smoker  . Smokeless tobacco: Never Used  . Alcohol use No     Allergies   Cephalexin; Ibuprofen; and Meloxicam   Review of Systems Review of Systems  Constitutional: Negative for chills and fever.  HENT: Negative for congestion and sore throat.   Eyes: Negative for pain and visual disturbance.  Respiratory: Negative for cough and shortness of breath.   Cardiovascular: Negative for chest pain and palpitations.  Gastrointestinal: Negative for abdominal pain and vomiting.  Genitourinary: Negative for dysuria.  Musculoskeletal: Negative for arthralgias and back pain.  Skin: Negative for color change and rash.  Neurological: Negative for seizures, syncope and headaches.  All other systems reviewed and are negative.    Physical Exam Updated Vital Signs BP 130/91 (BP Location: Left Arm)   Pulse 90   Temp 98.2 F (36.8 C) (Oral)   Resp 18   Ht 5\' 4"  (1.626 m)   Wt 169 lb (76.7 kg)   SpO2 100%   BMI 29.01 kg/m   Physical Exam  Constitutional: She is oriented to person, place, and time. She appears well-developed and well-nourished. No distress.  HENT:  Head: Normocephalic and atraumatic.  Nose: Nose normal.  Eyes: Conjunctivae and EOM are normal.  Pupils are equal, round, and reactive to light. Right eye exhibits no discharge. Left eye exhibits no discharge. No scleral icterus.  Neck: Normal range of motion. Neck supple.  Cardiovascular: Normal rate, regular rhythm and normal heart sounds.  Exam reveals no gallop and no friction rub.   No murmur heard. Pulmonary/Chest: Effort normal and breath sounds normal. No stridor. No respiratory distress. She has no rales.  Abdominal: Soft. She exhibits no distension.  Musculoskeletal: She exhibits no edema.       Right wrist: She exhibits tenderness. She exhibits normal range of motion, no bony tenderness, no swelling, no effusion, no crepitus and no deformity.  + [place]inel. Neg Phalen and [place]. Pain with wrist extension.  Neurological: She is alert and oriented to person, place, and time.  Skin: Skin is  warm and dry. No rash noted. She is not diaphoretic. No erythema.  Psychiatric: She has a normal mood and affect.  Vitals reviewed.    ED Treatments / Results  Labs (all labs ordered are listed, but only abnormal results are displayed) Labs Reviewed - No data to display  EKG  EKG Interpretation None       Radiology No results found.  Procedures Procedures (including critical care time)  Medications Ordered in ED Medications - No data to display   Initial Impression / Assessment and Plan / ED Course  I have reviewed the triage vital signs and the nursing notes.  Pertinent labs & imaging results that were available during my care of the patient were reviewed by me and considered in my medical decision making (see chart for details).  Clinical Course    Right wrist pain for 2 days. No significant trauma. No indication for imaging at this time. Likely sprain from sleeping on it or early carpal tunnel. Will place in brace. Tylenol prn. PCP follow up.  Final Clinical Impressions(s) / ED Diagnoses   Final diagnoses:  Right wrist pain   Disposition: Discharge  Condition:  Good  I have discussed the results, Dx and Tx plan with the patient who expressed understanding and agree(s) with the plan. Discharge instructions discussed at great length. The patient was given strict return precautions who verbalized understanding of the instructions. No further questions at time of discharge.    Current Discharge Medication List      Follow Up: [place] [phone] Call  For help establishing care with a care provider      Fatima Blank, MD 09/17/15 [phone]

## 2015-09-18 ENCOUNTER — Other Ambulatory Visit: Payer: Self-pay | Admitting: Physician Assistant

## 2015-09-30 DIAGNOSIS — M47817 Spondylosis without myelopathy or radiculopathy, lumbosacral region: Secondary | ICD-10-CM | POA: Diagnosis not present

## 2015-09-30 DIAGNOSIS — E1142 Type 2 diabetes mellitus with diabetic polyneuropathy: Secondary | ICD-10-CM | POA: Diagnosis not present

## 2015-09-30 DIAGNOSIS — G894 Chronic pain syndrome: Secondary | ICD-10-CM | POA: Diagnosis not present

## 2015-09-30 DIAGNOSIS — B0229 Other postherpetic nervous system involvement: Secondary | ICD-10-CM | POA: Diagnosis not present

## 2015-10-12 ENCOUNTER — Encounter (HOSPITAL_BASED_OUTPATIENT_CLINIC_OR_DEPARTMENT_OTHER): Payer: Self-pay | Admitting: Emergency Medicine

## 2015-10-12 ENCOUNTER — Emergency Department (HOSPITAL_BASED_OUTPATIENT_CLINIC_OR_DEPARTMENT_OTHER)
Admission: EM | Admit: 2015-10-12 | Discharge: 2015-10-12 | Disposition: A | Discharge status: Home / Self Care | Payer: BLUE CROSS/BLUE SHIELD | Attending: Emergency Medicine | Admitting: Emergency Medicine

## 2015-10-12 DIAGNOSIS — E109 Type 1 diabetes mellitus without complications: Secondary | ICD-10-CM | POA: Diagnosis not present

## 2015-10-12 DIAGNOSIS — N3090 Cystitis, unspecified without hematuria: Secondary | ICD-10-CM | POA: Diagnosis not present

## 2015-10-12 DIAGNOSIS — Z79899 Other long term (current) drug therapy: Secondary | ICD-10-CM | POA: Diagnosis not present

## 2015-10-12 DIAGNOSIS — Z7982 Long term (current) use of aspirin: Secondary | ICD-10-CM | POA: Insufficient documentation

## 2015-10-12 DIAGNOSIS — N3000 Acute cystitis without hematuria: Secondary | ICD-10-CM

## 2015-10-12 DIAGNOSIS — M545 Low back pain: Secondary | ICD-10-CM | POA: Diagnosis present

## 2015-10-12 LAB — URINALYSIS, ROUTINE W REFLEX MICROSCOPIC
Bilirubin Urine: NEGATIVE
Glucose, UA: 100 mg/dL — AB
Hgb urine dipstick: NEGATIVE
Ketones, ur: 15 mg/dL — AB
Nitrite: NEGATIVE
Protein, ur: 30 mg/dL — AB
Specific Gravity, Urine: 1.03 (ref 1.005–1.030)
pH: 6.5 (ref 5.0–8.0)

## 2015-10-12 LAB — URINE MICROSCOPIC-ADD ON: RBC / HPF: NONE SEEN RBC/hpf (ref 0–5)

## 2015-10-12 MED ORDER — SULFAMETHOXAZOLE-TRIMETHOPRIM 800-160 MG PO TABS: 1.0000 | ORAL_TABLET | Freq: Two times a day (BID) | ORAL | 0 refills | Status: AC

## 2015-10-12 NOTE — ED Triage Notes (Signed)
Pt in c/o back pain x today. Denies vaginal bleeding/discharge or urinary and bowel issues. States hurt this am and went away, then returned this afternoon. Pt alert, interactive, ambulatory in NAD.

## 2015-10-12 NOTE — ED Provider Notes (Signed)
[facility] DEPT [facility] Provider Note   CSN: [id number] Arrival date & time: 10/12/15  1556     History   Chief Complaint Chief Complaint  Patient presents with  . Back Pain    HPI Maureen [name] is a 45 y.o. female.  HPI   Maureen [name] is a 45 y.o. female, with a history of DM, fibromyalgia, chronic back pain, and neuropathy, presenting to the ED with Left-sided lower back pain that began this morning. Patient endorses having chronic midline and right-sided lower back pain, but typically does not have pain on the left side. Patient is a pain management patient and has tried her lidocaine patches without relief. Patient's current pain feels like a soreness, moderate in intensity, radiating down the back of the left leg.  Patient states her diabetes is well-controlled with blood glucose levels averaging around 130. She cannot take prednisone due to her diabetes. States she is also allergic to ibuprofen, but does not know what reaction she has had to it.  Patient denies falls/trauma, urinary complaints, changes in bowel or bladder function, neuro deficits, vaginal discharge or bleeding, fever/chills, nausea/vomiting, abdominal pain, or any other complaints.      Past Medical History:  Diagnosis Date  . Abdominal pain   . Anxiety   . Arthritis   . Biliary dyskinesia   . Cervical strain   . Closed head injury 2010  . Depression   . Fibromyalgia   . Hyperlipidemia   . Hypoglycemia   . Joint pain   . Major depressive disorder ([place])   . Migraine    hx of none recent  . Nausea & vomiting   . Post traumatic stress disorder (PTSD)   . Pulmonary embolism ([place])   . Renal stones   . Skin rash    due to medications  . Sleep trouble   . Well controlled type 1 diabetes mellitus with peripheral neuropathy [facility])     Patient Active Problem List   Diagnosis Date Noted  . Nausea   . Acute kidney injury ([place]) 02/19/2015  . Left flank pain 02/19/2015  . DKA (diabetic  ketoacidoses) ([place]) 10/24/2014  . HLD (hyperlipidemia) 10/24/2014  . Hx pulmonary embolism 09/28/2014  . Nausea vomiting and diarrhea 09/03/2014  . Abdominal pain 09/03/2014  . Abnormal LFTs 09/03/2014  . Dizziness 09/03/2014  . Acute pulmonary embolism ([place]) 09/03/2014  . Transaminitis 09/02/2014  . GAD (generalized anxiety disorder) 11/23/2013  . PTSD (post-traumatic stress disorder) 11/23/2013  . Severe major depression without psychotic features ([place]) 10/10/2013  . Major depression ([place]) 05/22/2013  . Chest pain 05/18/2012  . Endometriosis 12/08/2011  . Arthritis of left knee 12/08/2011  . Diabetic retinopathy ([place]) 12/08/2011  . Fibromyalgia 10/23/2011  . DM type 1 (diabetes mellitus, type 1) ([place]) 10/13/2011    Past Surgical History:  Procedure Laterality Date  . ABDOMINAL HYSTERECTOMY  2001  . APPENDECTOMY    . [place]  . CHOLECYSTECTOMY  02/10/2011   Procedure: LAPAROSCOPIC CHOLECYSTECTOMY WITH INTRAOPERATIVE CHOLANGIOGRAM;  Surgeon: Judieth Keens, DO;  Location: [facility];  Service: General;  Laterality: N/A;  . exploratory laparomty  Mar 04, 2012  . EYE SURGERY  2006   laser eye surgery left eye  . KNEE SURGERY Left 2012  . LAPAROSCOPIC APPENDECTOMY N/A 10/12/2012   Procedure: DIAGNOSTIC APPENDECTOMY LAPAROSCOPIC;  Surgeon: Madilyn Hook, DO;  Location: [facility];  Service: General;  Laterality: N/A;  . lipoma removed  yrs ago  . ROBOTIC  ASSISTED LAPAROSCOPIC LYSIS OF ADHESION N/A 03/04/2012   Procedure: ROBOTIC ASSISTED LAPAROSCOPIC LYSIS OF EXTENSIVE ADHESIONS;  Surgeon: Claiborne Billings A. Pamala Hurry, MD;  Location: [facility];  Service: Gynecology;  Laterality: N/A;  . TONSILLECTOMY  2001    OB History    No data available       Home Medications    Prior to Admission medications   Medication Sig Start Date End Date Taking? Authorizing Provider  aspirin 81 MG tablet Take 81 mg by mouth daily.    Historical Provider, MD  atorvastatin (LIPITOR) 40 MG tablet  Take 40 mg by mouth daily.    Historical Provider, MD  insulin aspart (NOVOLOG) 100 UNIT/ML injection Inject 8 Units into the skin 3 (three) times daily with meals. 10/16/13   Kerrie Buffalo, NP  insulin glargine (LANTUS) 100 UNIT/ML injection Inject 26 Units into the skin at bedtime.     Historical Provider, MD  LANTUS SOLOSTAR 100 UNIT/ML Solostar Pen INJECT 25 UNITS SUBCUTANEOUSLY ONCE DAILY 03/24/15   Historical Provider, MD  sulfamethoxazole-trimethoprim (BACTRIM DS,SEPTRA DS) 800-160 MG tablet Take 1 tablet by mouth 2 (two) times daily. 10/12/15 10/19/15  Lorayne Bender, PA-C    Family History Family History  Problem Relation Age of Onset  . Cancer Father     lymphoma  . Nephrolithiasis Father   . Vascular Disease Father   . Alcohol abuse Father   . Drug abuse Father   . Cancer Maternal Grandmother     colon  . Hypertension Mother   . Heart disease Mother   . Cataracts Mother   . Depression Mother   . Diabetes Brother   . Drug abuse Brother   . Mental illness Maternal Grandfather   . Cancer Maternal Grandfather   . Heart disease Paternal Grandmother   . Heart disease Paternal Grandfather   . Suicidality Maternal Uncle     Social History Social History  Substance Use Topics  . Smoking status: Never Smoker  . Smokeless tobacco: Never Used  . Alcohol use No     Allergies   Cephalexin; Ibuprofen; and Meloxicam   Review of Systems Review of Systems  Constitutional: Negative for chills, diaphoresis and fever.  Gastrointestinal: Negative for abdominal pain, blood in stool, constipation, diarrhea, nausea and vomiting.  Musculoskeletal: Positive for back pain. Negative for neck pain.  Neurological: Negative for weakness and numbness.  All other systems reviewed and are negative.    Physical Exam Updated Vital Signs BP 139/73   Pulse 95   Temp 97.7 F (36.5 C)   Resp 18   Ht 5\' 4"  (1.626 m)   Wt 75.8 kg   SpO2 100%   BMI 28.67 kg/m   Physical Exam    Constitutional: She appears well-developed and well-nourished. No distress.  HENT:  Head: Normocephalic and atraumatic.  Eyes: Conjunctivae are normal.  Neck: Normal range of motion. Neck supple.  Cardiovascular: Normal rate, regular rhythm and intact distal pulses.   Pulmonary/Chest: Effort normal. No respiratory distress.  Abdominal: Soft. There is no tenderness. There is no guarding.  Musculoskeletal: She exhibits no edema or tenderness.  Tenderness to the bilateral lumbar musculature. Pain increases with movement of the back. Full ROM in all extremities and spine. No midline spinal tenderness.   Lymphadenopathy:    She has no cervical adenopathy.  Neurological: She is alert.  No sensory deficits. Strength 5/5 in all extremities. No gait disturbance. Coordination intact.   Skin: Skin is warm and dry. She is not diaphoretic.  Psychiatric: She has a normal mood and affect. Her behavior is normal.  Nursing note and vitals reviewed.    ED Treatments / Results  Labs (all labs ordered are listed, but only abnormal results are displayed) Labs Reviewed  URINALYSIS, ROUTINE W REFLEX MICROSCOPIC (NOT AT [facility]) - Abnormal; Notable for the following:       Result Value   Color, Urine AMBER (*)    APPearance CLOUDY (*)    Glucose, UA 100 (*)    Ketones, ur 15 (*)    Protein, ur 30 (*)    Leukocytes, UA SMALL (*)    All other components within normal limits  URINE MICROSCOPIC-ADD ON - Abnormal; Notable for the following:    Squamous Epithelial / LPF 6-30 (*)    Bacteria, UA MANY (*)    Casts HYALINE CASTS (*)    All other components within normal limits  URINE CULTURE    EKG  EKG Interpretation None       Radiology No results found.  Procedures Procedures (including critical care time)  Medications Ordered in ED Medications - No data to display   Initial Impression / Assessment and Plan / ED Course  I have reviewed the triage vital signs and the nursing  notes.  Pertinent labs & imaging results that were available during my care of the patient were reviewed by me and considered in my medical decision making (see chart for details).  Clinical Course   Patient with no left back pain beginning this morning.  Findings and plan of care discussed with Fatima Blank, MD.   Musculoskeletal pain versus UTI. Evidence of UTI on UA. Patient already has treatment in place for musculoskeletal pain. Treatment for UTI added. Return precautions discussed.  Vitals:   10/12/15 1603 10/12/15 1819  BP: 139/73 165/99  Pulse: 95 98  Resp: 18 17  Temp: 97.7 F (36.5 C)   SpO2: 100% 100%  Weight: 75.8 kg   Height: 5\' 4"  (1.626 m)       Final Clinical Impressions(s) / ED Diagnoses   Final diagnoses:  Acute cystitis without hematuria    New Prescriptions Discharge Medication List as of 10/12/2015  7:13 PM    START taking these medications   Details  sulfamethoxazole-trimethoprim (BACTRIM DS,SEPTRA DS) 800-160 MG tablet Take 1 tablet by mouth 2 (two) times daily., Starting Sat 10/12/2015, Until Sat 10/19/2015, Print         Lorayne Bender, Hershal Coria 10/13/15 [place], MD 10/14/15 [phone]

## 2015-10-12 NOTE — Discharge Instructions (Signed)
There was evidence of an urinary tract infection on the urinalysis. Please take all of your antibiotics until finished!   You may develop abdominal discomfort or diarrhea from the antibiotic.  You may help offset this with probiotics which you can buy or get in yogurt. Do not eat or take the probiotics until 2 hours after your antibiotic.  Follow-up with your PCP should symptoms fail to resolve.

## 2015-10-14 LAB — URINE CULTURE

## 2015-10-25 ENCOUNTER — Other Ambulatory Visit: Payer: Self-pay

## 2015-10-25 MED ORDER — TRAZODONE HCL 50 MG PO TABS: 25.0000 mg | ORAL_TABLET | Freq: Every evening | ORAL | 0 refills | Status: DC | PRN

## 2015-10-29 DIAGNOSIS — B0229 Other postherpetic nervous system involvement: Secondary | ICD-10-CM | POA: Diagnosis not present

## 2015-10-29 DIAGNOSIS — M47817 Spondylosis without myelopathy or radiculopathy, lumbosacral region: Secondary | ICD-10-CM | POA: Diagnosis not present

## 2015-10-29 DIAGNOSIS — G894 Chronic pain syndrome: Secondary | ICD-10-CM | POA: Diagnosis not present

## 2015-10-29 DIAGNOSIS — E1142 Type 2 diabetes mellitus with diabetic polyneuropathy: Secondary | ICD-10-CM | POA: Diagnosis not present

## 2015-11-18 ENCOUNTER — Ambulatory Visit (INDEPENDENT_AMBULATORY_CARE_PROVIDER_SITE_OTHER): Payer: BLUE CROSS/BLUE SHIELD | Admitting: Orthopedic Surgery

## 2015-11-18 ENCOUNTER — Encounter (INDEPENDENT_AMBULATORY_CARE_PROVIDER_SITE_OTHER): Payer: Self-pay | Admitting: Orthopedic Surgery

## 2015-11-18 VITALS — Ht 64.0 in | Wt 176.0 lb

## 2015-11-18 DIAGNOSIS — S93325S Dislocation of tarsometatarsal joint of left foot, sequela: Secondary | ICD-10-CM

## 2015-11-18 DIAGNOSIS — G5762 Lesion of plantar nerve, left lower limb: Secondary | ICD-10-CM | POA: Diagnosis not present

## 2015-11-18 MED ORDER — LIDOCAINE HCL 1 % IJ SOLN
1.0000 mL | INTRAMUSCULAR | Status: AC | PRN
  Administered 2015-11-18: 1 mL

## 2015-11-18 MED ORDER — METHYLPREDNISOLONE ACETATE 40 MG/ML IJ SUSP
40.0000 mg | INTRAMUSCULAR | Status: AC | PRN
  Administered 2015-11-18: 40 mg via INTRA_ARTICULAR

## 2015-11-18 NOTE — Progress Notes (Signed)
Office Visit Note   Patient: Maureen [name]           Date of Birth: March 08, 1970           MRN: [id number] Visit Date: 11/18/2015              Requested by: [name], MD [address] [place], [place] [address] PCP: [name], MD   Assessment & Plan: Visit Diagnoses:  1. Morton neuroma, left   2. Lisfranc dislocation, left, sequela     Plan: Follow-up in 4 weeks. If patient is still symptomatic at the Lisfranc fracture base of the second metatarsal left foot would plan for open reduction internal fixation with fusion the base of the first metatarsal and fusion across the Lisfranc complex. Patient received Morton's neuroma injection today.  Follow-Up Instructions: Return in about 4 weeks (around 12/16/2015).   Orders:  No orders of the defined types were placed in this encounter.  No orders of the defined types were placed in this encounter.     Procedures: Small Joint Inj Date/Time: 11/18/2015 11:07 AM Performed by: Cartina Brousseau V Authorized by: Newt Minion   Consent Given by:  Patient Site marked: the procedure site was marked   Timeout: prior to procedure the correct patient, procedure, and site was verified   Indications:  Pain and diagnostic evaluation Location:  Foot Site:  L intertarsal Prep: patient was prepped and draped in usual sterile fashion   Needle Size:  22 G Spinal Needle: No   Approach:  Dorsal Ultrasound Guided: No   Fluoroscopic Guidance: No   Medications:  1 mL lidocaine 1 %; 40 mg methylPREDNISolone acetate 40 MG/ML Aspiration Attempted: No   Patient tolerance:  Patient tolerated the procedure well with no immediate complications     Clinical Data: No additional findings.   Subjective: Chief Complaint  Patient presents with  . Left Foot - Pain    DOI 04/05/15 Lisfranc fracture through the base of 2nd MT    DOI 04/05/15 Lisfranc fracture through the base of the 2nd MT. Pt has d/c her neurontin. She is in pain  management. Not doing heel cord stretching bc this increases her pain per pt report. She is wearing timberland boots as a stiff soled shoe at times but in regular tennis shoe today. She c/o continued pain with ambulation.    Review of Systems   Objective: Vital Signs: Ht 5\' 4"  (1.626 m)   Wt 176 lb (79.8 kg)   BMI 30.21 kg/m   Physical Exam Patient is alert oriented no adenopathy well-dressed normal affect normal respiratory effort. She does have an antalgic gait.   Examination the left foot patient has a good dorsalis pedis pulse she has minimal swelling she does have some tenderness to palpation across the base of the second metatarsal distraction across the Lisfranc complex is minimally tender. Patient is max we tender to palpation over the third web space consistent with a Morton's neuroma. Her previous radiographs show what may be a fibrous union the base of the second metatarsal. Ortho Exam  Specialty Comments:  No specialty comments available.  Imaging: No results found.   PMFS History: Patient Active Problem List   Diagnosis Date Noted  . Nausea   . Acute kidney injury ([place]) 02/19/2015  . Left flank pain 02/19/2015  . DKA (diabetic ketoacidoses) ([place]) 10/24/2014  . HLD (hyperlipidemia) 10/24/2014  . Hx pulmonary embolism 09/28/2014  . Nausea vomiting and diarrhea 09/03/2014  . Abdominal pain  09/03/2014  . Abnormal LFTs 09/03/2014  . Dizziness 09/03/2014  . Acute pulmonary embolism ([place]) 09/03/2014  . Transaminitis 09/02/2014  . GAD (generalized anxiety disorder) 11/23/2013  . PTSD (post-traumatic stress disorder) 11/23/2013  . Severe major depression without psychotic features ([place]) 10/10/2013  . Major depression ([place]) 05/22/2013  . Chest pain 05/18/2012  . Endometriosis 12/08/2011  . Arthritis of left knee 12/08/2011  . Diabetic retinopathy ([place]) 12/08/2011  . Fibromyalgia 10/23/2011  . DM type 1 (diabetes mellitus, type 1) ([place]) 10/13/2011   Past Medical  History:  Diagnosis Date  . Abdominal pain   . Anxiety   . Arthritis   . Biliary dyskinesia   . Cervical strain   . Closed head injury 2010  . Depression   . Fibromyalgia   . Hyperlipidemia   . Hypoglycemia   . Joint pain   . Major depressive disorder   . Migraine    hx of none recent  . Nausea & vomiting   . Post traumatic stress disorder (PTSD)   . Pulmonary embolism ([place])   . Renal stones   . Skin rash    due to medications  . Sleep trouble   . Well controlled type 1 diabetes mellitus with peripheral neuropathy ([place])     Family History  Problem Relation Age of Onset  . Cancer Father     lymphoma  . Nephrolithiasis Father   . Vascular Disease Father   . Alcohol abuse Father   . Drug abuse Father   . Cancer Maternal Grandmother     colon  . Hypertension Mother   . Heart disease Mother   . Cataracts Mother   . Depression Mother   . Diabetes Brother   . Drug abuse Brother   . Mental illness Maternal Grandfather   . Cancer Maternal Grandfather   . Heart disease Paternal Grandmother   . Heart disease Paternal Grandfather   . Suicidality Maternal Uncle     Past Surgical History:  Procedure Laterality Date  . ABDOMINAL HYSTERECTOMY  2001  . APPENDECTOMY    . [place]  . CHOLECYSTECTOMY  02/10/2011   Procedure: LAPAROSCOPIC CHOLECYSTECTOMY WITH INTRAOPERATIVE CHOLANGIOGRAM;  Surgeon: Judieth Keens, DO;  Location: [facility];  Service: General;  Laterality: N/A;  . exploratory laparomty  Mar 04, 2012  . EYE SURGERY  2006   laser eye surgery left eye  . KNEE SURGERY Left 2012  . LAPAROSCOPIC APPENDECTOMY N/A 10/12/2012   Procedure: DIAGNOSTIC APPENDECTOMY LAPAROSCOPIC;  Surgeon: Madilyn Hook, DO;  Location[facility];  Service: General;  Laterality: N/A;  . lipoma removed  yrs ago  . ROBOTIC ASSISTED LAPAROSCOPIC LYSIS OF ADHESION N/A 03/04/2012   Procedure: ROBOTIC ASSISTED LAPAROSCOPIC LYSIS OF EXTENSIVE ADHESIONS;  Surgeon: Claiborne Billings A. Pamala Hurry, MD;   Location: [facility];  Service: Gynecology;  Laterality: N/A;  . 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 Smoking status: Never Smoker  . Smokeless tobacco: Never Used  . Alcohol use No  . Drug use: No  . Sexual activity: Not on file

## 2015-11-19 ENCOUNTER — Encounter (HOSPITAL_BASED_OUTPATIENT_CLINIC_OR_DEPARTMENT_OTHER): Payer: Self-pay

## 2015-11-19 ENCOUNTER — Emergency Department (HOSPITAL_COMMUNITY): Payer: BLUE CROSS/BLUE SHIELD

## 2015-11-19 ENCOUNTER — Observation Stay (HOSPITAL_BASED_OUTPATIENT_CLINIC_OR_DEPARTMENT_OTHER)
Admission: EM | Admit: 2015-11-19 | Discharge: 2015-11-20 | Disposition: A | Discharge status: Home / Self Care | Payer: BLUE CROSS/BLUE SHIELD | Attending: Internal Medicine | Admitting: Internal Medicine

## 2015-11-19 ENCOUNTER — Other Ambulatory Visit (INDEPENDENT_AMBULATORY_CARE_PROVIDER_SITE_OTHER): Payer: Self-pay | Admitting: Orthopedic Surgery

## 2015-11-19 DIAGNOSIS — Z886 Allergy status to analgesic agent status: Secondary | ICD-10-CM | POA: Diagnosis not present

## 2015-11-19 DIAGNOSIS — Z79899 Other long term (current) drug therapy: Secondary | ICD-10-CM | POA: Insufficient documentation

## 2015-11-19 DIAGNOSIS — R079 Chest pain, unspecified: Secondary | ICD-10-CM | POA: Diagnosis not present

## 2015-11-19 DIAGNOSIS — Z9889 Other specified postprocedural states: Secondary | ICD-10-CM | POA: Diagnosis not present

## 2015-11-19 DIAGNOSIS — R51 Headache: Secondary | ICD-10-CM | POA: Diagnosis present

## 2015-11-19 DIAGNOSIS — R519 Headache, unspecified: Secondary | ICD-10-CM

## 2015-11-19 DIAGNOSIS — Z881 Allergy status to other antibiotic agents status: Secondary | ICD-10-CM | POA: Insufficient documentation

## 2015-11-19 DIAGNOSIS — F4310 Post-traumatic stress disorder, unspecified: Secondary | ICD-10-CM | POA: Diagnosis not present

## 2015-11-19 DIAGNOSIS — E1042 Type 1 diabetes mellitus with diabetic polyneuropathy: Secondary | ICD-10-CM | POA: Diagnosis not present

## 2015-11-19 DIAGNOSIS — Z90710 Acquired absence of both cervix and uterus: Secondary | ICD-10-CM | POA: Insufficient documentation

## 2015-11-19 DIAGNOSIS — Z87442 Personal history of urinary calculi: Secondary | ICD-10-CM | POA: Insufficient documentation

## 2015-11-19 DIAGNOSIS — M797 Fibromyalgia: Secondary | ICD-10-CM | POA: Insufficient documentation

## 2015-11-19 DIAGNOSIS — M1990 Unspecified osteoarthritis, unspecified site: Secondary | ICD-10-CM | POA: Diagnosis not present

## 2015-11-19 DIAGNOSIS — G43909 Migraine, unspecified, not intractable, without status migrainosus: Secondary | ICD-10-CM

## 2015-11-19 DIAGNOSIS — Z8489 Family history of other specified conditions: Secondary | ICD-10-CM | POA: Insufficient documentation

## 2015-11-19 DIAGNOSIS — Z800 Family history of malignant neoplasm of digestive organs: Secondary | ICD-10-CM | POA: Insufficient documentation

## 2015-11-19 DIAGNOSIS — Z818 Family history of other mental and behavioral disorders: Secondary | ICD-10-CM | POA: Insufficient documentation

## 2015-11-19 DIAGNOSIS — F322 Major depressive disorder, single episode, severe without psychotic features: Secondary | ICD-10-CM | POA: Insufficient documentation

## 2015-11-19 DIAGNOSIS — G8929 Other chronic pain: Secondary | ICD-10-CM | POA: Diagnosis present

## 2015-11-19 DIAGNOSIS — E785 Hyperlipidemia, unspecified: Secondary | ICD-10-CM | POA: Diagnosis not present

## 2015-11-19 DIAGNOSIS — R1911 Absent bowel sounds: Secondary | ICD-10-CM | POA: Diagnosis not present

## 2015-11-19 DIAGNOSIS — R7989 Other specified abnormal findings of blood chemistry: Secondary | ICD-10-CM

## 2015-11-19 DIAGNOSIS — Z807 Family history of other malignant neoplasms of lymphoid, hematopoietic and related tissues: Secondary | ICD-10-CM | POA: Diagnosis not present

## 2015-11-19 DIAGNOSIS — R778 Other specified abnormalities of plasma proteins: Secondary | ICD-10-CM

## 2015-11-19 DIAGNOSIS — M542 Cervicalgia: Secondary | ICD-10-CM | POA: Diagnosis not present

## 2015-11-19 DIAGNOSIS — Z794 Long term (current) use of insulin: Secondary | ICD-10-CM | POA: Insufficient documentation

## 2015-11-19 DIAGNOSIS — F411 Generalized anxiety disorder: Secondary | ICD-10-CM | POA: Insufficient documentation

## 2015-11-19 DIAGNOSIS — Z7982 Long term (current) use of aspirin: Secondary | ICD-10-CM | POA: Diagnosis not present

## 2015-11-19 DIAGNOSIS — R200 Anesthesia of skin: Secondary | ICD-10-CM | POA: Diagnosis present

## 2015-11-19 DIAGNOSIS — G43809 Other migraine, not intractable, without status migrainosus: Secondary | ICD-10-CM

## 2015-11-19 DIAGNOSIS — E109 Type 1 diabetes mellitus without complications: Secondary | ICD-10-CM | POA: Diagnosis present

## 2015-11-19 DIAGNOSIS — Z9049 Acquired absence of other specified parts of digestive tract: Secondary | ICD-10-CM | POA: Diagnosis not present

## 2015-11-19 DIAGNOSIS — E10319 Type 1 diabetes mellitus with unspecified diabetic retinopathy without macular edema: Secondary | ICD-10-CM | POA: Diagnosis not present

## 2015-11-19 DIAGNOSIS — Z8249 Family history of ischemic heart disease and other diseases of the circulatory system: Secondary | ICD-10-CM | POA: Insufficient documentation

## 2015-11-19 DIAGNOSIS — R0789 Other chest pain: Principal | ICD-10-CM | POA: Insufficient documentation

## 2015-11-19 DIAGNOSIS — Z813 Family history of other psychoactive substance abuse and dependence: Secondary | ICD-10-CM | POA: Insufficient documentation

## 2015-11-19 DIAGNOSIS — Z86711 Personal history of pulmonary embolism: Secondary | ICD-10-CM | POA: Diagnosis not present

## 2015-11-19 DIAGNOSIS — Z8673 Personal history of transient ischemic attack (TIA), and cerebral infarction without residual deficits: Secondary | ICD-10-CM | POA: Diagnosis not present

## 2015-11-19 DIAGNOSIS — R748 Abnormal levels of other serum enzymes: Secondary | ICD-10-CM | POA: Diagnosis not present

## 2015-11-19 DIAGNOSIS — Z811 Family history of alcohol abuse and dependence: Secondary | ICD-10-CM | POA: Insufficient documentation

## 2015-11-19 HISTORY — DX: Cerebral infarction, unspecified: I63.9

## 2015-11-19 HISTORY — DX: Transient cerebral ischemic attack, unspecified: G45.9

## 2015-11-19 LAB — URINALYSIS, ROUTINE W REFLEX MICROSCOPIC
Bilirubin Urine: NEGATIVE
Glucose, UA: >1000 mg/dL — AB
Hgb urine dipstick: NEGATIVE
Ketones, ur: NEGATIVE mg/dL
Leukocytes, UA: NEGATIVE
Nitrite: NEGATIVE
Protein, ur: NEGATIVE mg/dL
Specific Gravity, Urine: 1.026 (ref 1.005–1.030)
pH: 6 (ref 5.0–8.0)

## 2015-11-19 LAB — RAPID URINE DRUG SCREEN, HOSP PERFORMED
Amphetamines: NOT DETECTED
Barbiturates: NOT DETECTED
Benzodiazepines: NOT DETECTED
Cocaine: NOT DETECTED
Opiates: NOT DETECTED
Tetrahydrocannabinol: NOT DETECTED

## 2015-11-19 LAB — CBC WITH DIFFERENTIAL/PLATELET
Basophils Absolute: 0 10*3/uL (ref 0.0–0.1)
Basophils Relative: 0 %
Eosinophils Absolute: 0.1 10*3/uL (ref 0.0–0.7)
Eosinophils Relative: 1 %
HCT: 42.3 % (ref 36.0–46.0)
Hemoglobin: 14.3 g/dL (ref 12.0–15.0)
Lymphocytes Relative: 19 %
Lymphs Abs: 2.4 10*3/uL (ref 0.7–4.0)
MCH: 31.4 pg (ref 26.0–34.0)
MCHC: 33.8 g/dL (ref 30.0–36.0)
MCV: 93 fL (ref 78.0–100.0)
Monocytes Absolute: 0.6 10*3/uL (ref 0.1–1.0)
Monocytes Relative: 4 %
Neutro Abs: 9.6 10*3/uL — ABNORMAL HIGH (ref 1.7–7.7)
Neutrophils Relative %: 76 %
Platelets: 437 10*3/uL — ABNORMAL HIGH (ref 150–400)
RBC: 4.55 MIL/uL (ref 3.87–5.11)
RDW: 13.1 % (ref 11.5–15.5)
WBC: 12.7 10*3/uL — ABNORMAL HIGH (ref 4.0–10.5)

## 2015-11-19 LAB — URINE MICROSCOPIC-ADD ON: RBC / HPF: NONE SEEN RBC/hpf (ref 0–5)

## 2015-11-19 LAB — GLUCOSE, CAPILLARY: Glucose-Capillary: 146 mg/dL — ABNORMAL HIGH (ref 65–99)

## 2015-11-19 LAB — COMPREHENSIVE METABOLIC PANEL
ALT: 16 U/L (ref 14–54)
AST: 15 U/L (ref 15–41)
Albumin: 3.8 g/dL (ref 3.5–5.0)
Alkaline Phosphatase: 110 U/L (ref 38–126)
Anion gap: 7 (ref 5–15)
BUN: 13 mg/dL (ref 6–20)
CO2: 29 mmol/L (ref 22–32)
Calcium: 9.4 mg/dL (ref 8.9–10.3)
Chloride: 101 mmol/L (ref 101–111)
Creatinine, Ser: 1.11 mg/dL — ABNORMAL HIGH (ref 0.44–1.00)
GFR calc Af Amer: >60 mL/min (ref >60)
GFR calc non Af Amer: 60 mL/min — ABNORMAL LOW (ref >60)
Glucose, Bld: 245 mg/dL — ABNORMAL HIGH (ref 65–99)
Potassium: 4.1 mmol/L (ref 3.5–5.1)
Sodium: 137 mmol/L (ref 135–145)
Total Bilirubin: 0.6 mg/dL (ref 0.3–1.2)
Total Protein: 7.8 g/dL (ref 6.5–8.1)

## 2015-11-19 LAB — TROPONIN I
Troponin I: 0.07 ng/mL (ref <0.03)
Troponin I: <0.03 ng/mL (ref <0.03)

## 2015-11-19 LAB — APTT: aPTT: 28 seconds (ref 24–36)

## 2015-11-19 LAB — PROTIME-INR
INR: 0.92
Prothrombin Time: 12.4 seconds (ref 11.4–15.2)

## 2015-11-19 MED ORDER — MORPHINE SULFATE (PF) 4 MG/ML IV SOLN
4.0000 mg | Freq: Once | INTRAVENOUS | Status: AC
  Administered 2015-11-19: 4 mg via INTRAVENOUS
  Filled 2015-11-19: qty 1

## 2015-11-19 MED ORDER — SODIUM CHLORIDE 0.9 % IV BOLUS (SEPSIS)
1000.0000 mL | Freq: Once | INTRAVENOUS | Status: AC
  Administered 2015-11-19: 1000 mL via INTRAVENOUS

## 2015-11-19 MED ORDER — METOCLOPRAMIDE HCL 5 MG/ML IJ SOLN
10.0000 mg | Freq: Once | INTRAMUSCULAR | Status: AC
  Administered 2015-11-19: 10 mg via INTRAVENOUS
  Filled 2015-11-19: qty 2

## 2015-11-19 MED ORDER — IOPAMIDOL (ISOVUE-370) INJECTION 76%
INTRAVENOUS | Status: AC
  Administered 2015-11-19: 70 mL
  Filled 2015-11-19: qty 100

## 2015-11-19 MED ORDER — ASPIRIN 325 MG PO TABS
325.0000 mg | ORAL_TABLET | Freq: Once | ORAL | Status: AC
  Administered 2015-11-19: 325 mg via ORAL
  Filled 2015-11-19: qty 1

## 2015-11-19 MED ORDER — ONDANSETRON HCL 4 MG/2ML IJ SOLN
4.0000 mg | Freq: Once | INTRAMUSCULAR | Status: AC
  Administered 2015-11-19: 4 mg via INTRAVENOUS
  Filled 2015-11-19: qty 2

## 2015-11-19 MED ORDER — ACETAMINOPHEN 325 MG PO TABS: 650.0000 mg | ORAL_TABLET | ORAL | Status: DC | PRN

## 2015-11-19 MED ORDER — DIPHENHYDRAMINE HCL 50 MG/ML IJ SOLN
25.0000 mg | Freq: Once | INTRAMUSCULAR | Status: AC
  Administered 2015-11-19: 25 mg via INTRAVENOUS
  Filled 2015-11-19: qty 1

## 2015-11-19 MED ORDER — IOPAMIDOL (ISOVUE-370) INJECTION 76%
INTRAVENOUS | Status: AC
  Filled 2015-11-19: qty 100

## 2015-11-19 MED ORDER — ONDANSETRON HCL 4 MG/2ML IJ SOLN: 4.0000 mg | Freq: Four times a day (QID) | INTRAMUSCULAR | Status: DC | PRN

## 2015-11-19 MED ORDER — MORPHINE SULFATE (PF) 2 MG/ML IV SOLN
2.0000 mg | INTRAVENOUS | Status: DC | PRN
  Administered 2015-11-19 – 2015-11-20 (×3): 2 mg via INTRAVENOUS
  Filled 2015-11-19 (×3): qty 1

## 2015-11-19 MED ORDER — GI COCKTAIL ~~LOC~~
30.0000 mL | Freq: Once | ORAL | Status: AC
  Administered 2015-11-19: 30 mL via ORAL
  Filled 2015-11-19: qty 30

## 2015-11-19 MED ORDER — INSULIN ASPART 100 UNIT/ML ~~LOC~~ SOLN: 0.0000 [IU] | Freq: Every day | SUBCUTANEOUS | Status: DC

## 2015-11-19 MED ORDER — NITROGLYCERIN 0.4 MG/SPRAY TL SOLN: 1.0000 | Status: DC | PRN

## 2015-11-19 MED ORDER — ASPIRIN EC 81 MG PO TBEC
81.0000 mg | DELAYED_RELEASE_TABLET | Freq: Every day | ORAL | Status: DC
  Administered 2015-11-20: 81 mg via ORAL
  Filled 2015-11-19: qty 1

## 2015-11-19 MED ORDER — KETOROLAC TROMETHAMINE 30 MG/ML IJ SOLN
30.0000 mg | Freq: Once | INTRAMUSCULAR | Status: AC
  Administered 2015-11-19: 30 mg via INTRAVENOUS
  Filled 2015-11-19: qty 1

## 2015-11-19 MED ORDER — NITROGLYCERIN 0.4 MG SL SUBL: 0.4000 mg | SUBLINGUAL_TABLET | SUBLINGUAL | Status: DC | PRN

## 2015-11-19 MED ORDER — INSULIN ASPART 100 UNIT/ML ~~LOC~~ SOLN
0.0000 [IU] | Freq: Three times a day (TID) | SUBCUTANEOUS | Status: DC
  Administered 2015-11-20: 11 [IU] via SUBCUTANEOUS

## 2015-11-19 MED ORDER — ATORVASTATIN CALCIUM 40 MG PO TABS
40.0000 mg | ORAL_TABLET | Freq: Every day | ORAL | Status: DC
  Administered 2015-11-20: 40 mg via ORAL
  Filled 2015-11-19: qty 1

## 2015-11-19 MED ORDER — FLUCONAZOLE 150 MG PO TABS
150.0000 mg | ORAL_TABLET | Freq: Once | ORAL | Status: AC
  Administered 2015-11-20: 150 mg via ORAL
  Filled 2015-11-19 (×2): qty 1

## 2015-11-19 MED ORDER — INSULIN GLARGINE 100 UNIT/ML ~~LOC~~ SOLN
26.0000 [IU] | Freq: Every day | SUBCUTANEOUS | Status: DC
  Administered 2015-11-19: 26 [IU] via SUBCUTANEOUS
  Filled 2015-11-19 (×2): qty 0.26

## 2015-11-19 MED ORDER — FAMOTIDINE 20 MG PO TABS
20.0000 mg | ORAL_TABLET | Freq: Once | ORAL | Status: AC
  Administered 2015-11-19: 20 mg via ORAL
  Filled 2015-11-19: qty 1

## 2015-11-19 NOTE — ED Provider Notes (Signed)
The patient is a 45 year old female, she does have a history of migraine headaches and has been followed by Dr. Jannifer Franklin, she also has a recent fracture of her foot and had a steroid injection in the fracture area yesterday by her orthopedist. She reports that she has had approximately 3 days of headache, she states this is different from her chronic headaches and that it is right temporal, more intense, not getting better with typical measures including medications and finding a cool dark room. She was seen initially at the emergency department this morning and referred to this hospital for an MRI of her brain. The patient denies any other new medications, denies any head injury, denies any visual changes today though she did state 2 days ago she had left I blurred vision which improved spontaneously. She does report having some right upper extremity numbness which is different than her chronic diabetic neuropathy. On exam the patient has clear heart and lung sounds, she is well-appearing with no cranial nerve deficits 3 through 12, gross visual acuity is normal, peripheral visual fields are normal, bilateral coordination of the upper extremities is normal by finger-nose-finger, strength is normal 5 out of 5 in all 4 extremities. She does have a slight decreased sensation from the shoulder to the wrist on the right, this is more stocking glove, there is no other sensory deficits.  At this time the patient is waiting for her MRI, I do not see any other need for any other acute intervention at this time other than some pain control. She did not get any pain relief with the medications given prior to her arrival at this hospital. She is also outside the window of any intervention for stroke as she did wake up at 5:30 this morning with the symptoms and his been approximately 10-1/2 hours since that time.  Change of shift - care signed out to Dr. Winfred Leeds.    Noemi Chapel, MD 11/19/15 [phone]

## 2015-11-19 NOTE — ED Provider Notes (Signed)
Complains of headache gradual onset 3-4 days ago does not feel like migraine headache she's had in the past she also complains of anterior chest pain onset 3-4 days chest. Chest Pain is worse with exertion improved with rest. She denies any shortness of breath. She became lightheaded 3 days ago which is slight. She does have history of pulmonary embolism. Currently not on blood thinners. On exam no distress lungs clear to ENT exam does fact atraumatic neck supple lungs clear auscultation heart regular rate and rhythm no murmurs abdomen nondistended nontender extremities without edema, neurovascular intact neurologic Glasgow Coma Score 15 cranial nerves II through XII intact grossly moves all extremity as well Results for orders placed or performed during the hospital encounter of 11/19/15  Comprehensive metabolic panel  Result Value Ref Range   Sodium 137 135 - 145 mmol/L   Potassium 4.1 3.5 - 5.1 mmol/L   Chloride 101 101 - 111 mmol/L   CO2 29 22 - 32 mmol/L   Glucose, Bld 245 (H) 65 - 99 mg/dL   BUN 13 6 - 20 mg/dL   Creatinine, Ser 1.11 (H) 0.44 - 1.00 mg/dL   Calcium 9.4 8.9 - 10.3 mg/dL   Total Protein 7.8 6.5 - 8.1 g/dL   Albumin 3.8 3.5 - 5.0 g/dL   AST 15 15 - 41 U/L   ALT 16 14 - 54 U/L   Alkaline Phosphatase 110 38 - 126 U/L   Total Bilirubin 0.6 0.3 - 1.2 mg/dL   GFR calc non Af Amer 60 (L) >60 mL/min   GFR calc Af Amer >60 >60 mL/min   Anion gap 7 5 - 15  CBC with Differential  Result Value Ref Range   WBC 12.7 (H) 4.0 - 10.5 K/uL   RBC 4.55 3.87 - 5.11 MIL/uL   Hemoglobin 14.3 12.0 - 15.0 g/dL   HCT 42.3 36.0 - 46.0 %   MCV 93.0 78.0 - 100.0 fL   MCH 31.4 26.0 - 34.0 pg   MCHC 33.8 30.0 - 36.0 g/dL   RDW 13.1 11.5 - 15.5 %   Platelets 437 (H) 150 - 400 K/uL   Neutrophils Relative % 76 %   Neutro Abs 9.6 (H) 1.7 - 7.7 K/uL   Lymphocytes Relative 19 %   Lymphs Abs 2.4 0.7 - 4.0 K/uL   Monocytes Relative 4 %   Monocytes Absolute 0.6 0.1 - 1.0 K/uL   Eosinophils  Relative 1 %   Eosinophils Absolute 0.1 0.0 - 0.7 K/uL   Basophils Relative 0 %   Basophils Absolute 0.0 0.0 - 0.1 K/uL  Protime-INR  Result Value Ref Range   Prothrombin Time 12.4 11.4 - 15.2 seconds   INR 0.92   APTT  Result Value Ref Range   aPTT 28 24 - 36 seconds  Urine rapid drug screen (hosp performed)not at [facility]  Result Value Ref Range   Opiates NONE DETECTED NONE DETECTED   Cocaine NONE DETECTED NONE DETECTED   Benzodiazepines NONE DETECTED NONE DETECTED   Amphetamines NONE DETECTED NONE DETECTED   Tetrahydrocannabinol NONE DETECTED NONE DETECTED   Barbiturates NONE DETECTED NONE DETECTED  Urinalysis, Routine w reflex microscopic (not at [facility])  Result Value Ref Range   Color, Urine YELLOW YELLOW   APPearance CLOUDY (A) CLEAR   Specific Gravity, Urine 1.026 1.005 - 1.030   pH 6.0 5.0 - 8.0   Glucose, UA >1000 (A) NEGATIVE mg/dL   Hgb urine dipstick NEGATIVE NEGATIVE   Bilirubin Urine NEGATIVE NEGATIVE  Ketones, ur NEGATIVE NEGATIVE mg/dL   Protein, ur NEGATIVE NEGATIVE mg/dL   Nitrite NEGATIVE NEGATIVE   Leukocytes, UA NEGATIVE NEGATIVE  Troponin I  Result Value Ref Range   Troponin I 0.07 (HH) <0.03 ng/mL  Urine microscopic-add on  Result Value Ref Range   Squamous Epithelial / LPF 6-30 (A) NONE SEEN   WBC, UA 0-5 0 - 5 WBC/hpf   RBC / HPF NONE SEEN 0 - 5 RBC/hpf   Bacteria, UA MANY (A) NONE SEEN   Urine-Other YEAST PRESENT    Mr Brain Wo Contrast (neuro Protocol)  Result Date: 11/19/2015 CLINICAL DATA:  Posterior headache and right arm numbness. EXAM: MRI HEAD WITHOUT CONTRAST MRA HEAD WITHOUT CONTRAST TECHNIQUE: Multiplanar, multiecho pulse sequences of the brain and surrounding structures were obtained without intravenous contrast. Angiographic images of the head were obtained using MRA technique without contrast. COMPARISON:  09/02/2014 brain MRI FINDINGS: MRI HEAD FINDINGS Brain: Normal. No acute infarction, hemorrhage, hydrocephalus, extra-axial  collection or mass lesion. Vascular: Normal flow voids. Skull and upper cervical spine: Normal marrow signal. Sinuses/Orbits: Negative. MRA HEAD FINDINGS Comparatively small, but smooth right ICA in the setting of hypoplastic right A1 segment. Intact circle-of-Willis. Symmetric vertebral arteries with standard vertebrobasilar branching. No major branch occlusion, beading, or flow limiting stenosis. IMPRESSION: Normal brain MRI and intracranial MRA. Electronically Signed   By: Monte Fantasia M.D.   On: 11/19/2015 17:28   Mr Jodene Nam Head (cerebral Arteries)  Result Date: 11/19/2015 CLINICAL DATA:  Posterior headache and right arm numbness. EXAM: MRI HEAD WITHOUT CONTRAST MRA HEAD WITHOUT CONTRAST TECHNIQUE: Multiplanar, multiecho pulse sequences of the brain and surrounding structures were obtained without intravenous contrast. Angiographic images of the head were obtained using MRA technique without contrast. COMPARISON:  09/02/2014 brain MRI FINDINGS: MRI HEAD FINDINGS Brain: Normal. No acute infarction, hemorrhage, hydrocephalus, extra-axial collection or mass lesion. Vascular: Normal flow voids. Skull and upper cervical spine: Normal marrow signal. Sinuses/Orbits: Negative. MRA HEAD FINDINGS Comparatively small, but smooth right ICA in the setting of hypoplastic right A1 segment. Intact circle-of-Willis. Symmetric vertebral arteries with standard vertebrobasilar branching. No major branch occlusion, beading, or flow limiting stenosis. IMPRESSION: Normal brain MRI and intracranial MRA. Electronically Signed   By: Monte Fantasia M.D.   On: 11/19/2015 17:28  At 7:50 PM patient still complains of anterior chest pain described as mild (6/10) and improved after treatment with intravenous morphine and oral aspirin. She still complains of headache also mild at present. She remains alert and appropriate. Heart score equals 4 based on EKG criteria troponin and right  Factors. Dr. Eulas Post consulted and will see patient  in the hospital. Plan 23 hour observation stepdown bed Diagnoses #1 headache #2 chest pain at rest #3 hyperglycemia   Orlie Dakin, MD 11/20/15 [id number]

## 2015-11-19 NOTE — ED Notes (Signed)
Pt returns from MRI ° °

## 2015-11-19 NOTE — H&P (Signed)
History and Physical    Maureen [name] [id number] DOB: Aug 09, 1970 DOA: 11/19/2015  PCP: [name], MD   Patient coming from: [facility]  Chief Complaint: Headache, chest pain  HPI: Maureen [name] is a 45 y.o. woman with a history of Type 1 DM (last A1c unknown, managed with subQ insulin injections), HLD, TIA, migraine headaches, and chronic pain (fibromyalgia, neuropathy) who presented to the ED in [facility] for evaluation of intractable headache since Sunday, with associated blurred vision in her left eye.  Apparently, she was transferred to [facility] with some urgency so that she could undergo MRI of the brain, but this study was negative for acute findings.  Headache has actually resolved after IV toradol, benadryl, and reglan in the ED.  However, she also complains of chest pain, exertional in nature, that has been intermittent for the past several days as well.  She thought that is was possibly musculoskeletal because she recently moved into a 3rd floor apartment and now has to climb several flights of stairs daily.  She has had pain radiating into her neck, which is atypical.  She has had diaphoresis with these episodes but no nausea, vomiting, syncope.  No history of swelling.  ED Course: She has received full strength aspirin and IV morphine.  She is still complaining of substernal chest discomfort.  EKG shows NSR without acute ST segment changes.  First troponin 0.07.  Urine drug screen negative.  U/A shows yeast.  CTA chest negative for PE; incidental finding of pulmonary nodule.  Hospitalist asked to admit for observation, further cardiac evaluation.  Review of Systems: History of UTI but no antibiotic in the past 30 days.  Otherwise, 10 point review of systems negative except as stated in the HPI.    Past Medical History:  Diagnosis Date  . Abdominal pain   . Anxiety   . Arthritis   . Biliary dyskinesia   . Cervical strain   . Closed head injury 2010  . Depression    . Fibromyalgia   . Hyperlipidemia   . Hypoglycemia   . Joint pain   . Major depressive disorder   . Migraine    hx of none recent  . Nausea & vomiting   . Post traumatic stress disorder (PTSD)   . Pulmonary embolism ([place])   . Renal stones   . Skin rash    due to medications  . Sleep trouble   . Stroke ([place])   . TIA (transient ischemic attack)   . Well controlled type 1 diabetes mellitus with peripheral neuropathy [facility])     Past Surgical History:  Procedure Laterality Date  . ABDOMINAL HYSTERECTOMY  2001  . APPENDECTOMY    . [place]  . CHOLECYSTECTOMY  02/10/2011   Procedure: LAPAROSCOPIC CHOLECYSTECTOMY WITH INTRAOPERATIVE CHOLANGIOGRAM;  Surgeon: Judieth Keens, DO;  Location: [facility];  Service: General;  Laterality: N/A;  . exploratory laparomty  Mar 04, 2012  . EYE SURGERY  2006   laser eye surgery left eye  . KNEE SURGERY Left 2012  . LAPAROSCOPIC APPENDECTOMY N/A 10/12/2012   Procedure: DIAGNOSTIC APPENDECTOMY LAPAROSCOPIC;  Surgeon: Madilyn Hook, DO;  Location: [facility];  Service: General;  Laterality: N/A;  . lipoma removed  yrs ago  . ROBOTIC ASSISTED LAPAROSCOPIC LYSIS OF ADHESION N/A 03/04/2012   Procedure: ROBOTIC ASSISTED LAPAROSCOPIC LYSIS OF EXTENSIVE ADHESIONS;  Surgeon: Claiborne Billings A. Pamala Hurry, MD;  Location: [facility];  Service: Gynecology;  Laterality: N/A;  . TONSILLECTOMY  2001     reports that she has never smoked. She has never used smokeless tobacco. She reports that she does not drink alcohol or use drugs.  She is not married.  She has two sons.  She is employed.  Allergies  Allergen Reactions  . Cephalexin Rash    Pt was admitted to the hospital upon taking.  . Ibuprofen Nausea And Vomiting and Rash  . Meloxicam Nausea Only    Family History  Problem Relation Age of Onset  . Cancer Father     lymphoma  . Nephrolithiasis Father   . Vascular Disease Father   . Alcohol abuse Father   . Drug abuse Father   . Cancer Maternal  Grandmother     colon  . Hypertension Mother   . Heart disease Mother   . Cataracts Mother   . Depression Mother   . Diabetes Brother   . Drug abuse Brother   . Mental illness Maternal Grandfather   . Cancer Maternal Grandfather   . Heart disease Paternal Grandmother   . Heart disease Paternal Grandfather   . Suicidality Maternal Uncle      Prior to Admission medications   Medication Sig Start Date End Date Taking? Authorizing Provider  acetaminophen (TYLENOL) 500 MG tablet Take 1,000 mg by mouth every 6 (six) hours as needed.   Yes Historical Provider, MD  aspirin 81 MG tablet Take 81 mg by mouth daily.   Yes Historical Provider, MD  atorvastatin (LIPITOR) 40 MG tablet Take 40 mg by mouth daily.   Yes Historical Provider, MD  insulin aspart (NOVOLOG) 100 UNIT/ML injection Inject 8 Units into the skin 3 (three) times daily with meals. 10/16/13  Yes Kerrie Buffalo, NP  insulin glargine (LANTUS) 100 UNIT/ML injection Inject 26 Units into the skin at bedtime.    Yes Historical Provider, MD  lidocaine (LIDODERM) 5 % Place 1 patch onto the skin over 24 hr. 10/30/15  Yes Historical Provider, MD    Physical Exam: Vitals:   11/19/15 1815 11/19/15 1830 11/19/15 1845 11/19/15 1900  BP: (!) 158/53 122/59 137/72 142/73  Pulse: 90 84 79 78  Resp: 21 17 17 18   Temp:      TempSrc:      SpO2: 100% 100% 100% 98%  Weight:      Height:          Constitutional: NAD, calm, NONtoxic appearing Vitals:   11/19/15 1815 11/19/15 1830 11/19/15 1845 11/19/15 1900  BP: (!) 158/53 122/59 137/72 142/73  Pulse: 90 84 79 78  Resp: 21 17 17 18   Temp:      TempSrc:      SpO2: 100% 100% 100% 98%  Weight:      Height:       Eyes: PERRL, lids and conjunctivae normal ENMT: Mucous membranes are moist. Posterior pharynx clear of any exudate or lesions. Normal dentition.  Neck: normal appearance, supple Respiratory: clear to auscultation bilaterally, no wheezing, no crackles. Normal respiratory effort.  No accessory muscle use.  Cardiovascular: Normal rate, regular rhythm, no murmurs / rubs / gallops. No extremity edema. 2+ pedal pulses.  GI: abdomen is soft and compressible.  No distention.  No tenderness.  Bowel sounds are present. Musculoskeletal:  No joint deformity in upper and lower extremities. Good ROM, no contractures. Normal muscle tone.  No reproducible chest wall tenderness.  She has point tenderness with palpation of her cervical spine. Skin: no rashes, warm and dry Neurologic: CN 2-12 grossly intact. No  perceived differences in strength in bilateral upper extremities for me.  Psychiatric: Normal judgment and insight. Alert and oriented x 3. Normal mood.     Labs on Admission: I have personally reviewed following labs and imaging studies  CBC:  Recent Labs Lab 11/19/15 1150  WBC 12.7*  NEUTROABS 9.6*  HGB 14.3  HCT 42.3  MCV 93.0  PLT 99991111*   Basic Metabolic Panel:  Recent Labs Lab 11/19/15 1150  NA 137  K 4.1  CL 101  CO2 29  GLUCOSE 245*  BUN 13  CREATININE 1.11*  CALCIUM 9.4   GFR: Estimated Creatinine Clearance: 66.7 mL/min (by C-G formula based on SCr of 1.11 mg/dL (H)). Liver Function Tests:  Recent Labs Lab 11/19/15 1150  AST 15  ALT 16  ALKPHOS 110  BILITOT 0.6  PROT 7.8  ALBUMIN 3.8   Coagulation Profile:  Recent Labs Lab 11/19/15 1150  INR 0.92   Cardiac Enzymes:  Recent Labs Lab 11/19/15 1150  TROPONINI 0.07*   Urine analysis:    Component Value Date/Time   COLORURINE YELLOW 11/19/2015 1225   APPEARANCEUR CLOUDY (A) 11/19/2015 1225   LABSPEC 1.026 11/19/2015 1225   PHURINE 6.0 11/19/2015 1225   GLUCOSEU >1000 (A) 11/19/2015 1225   HGBUR NEGATIVE 11/19/2015 [place] 11/19/2015 1225   BILIRUBINUR negative 01/17/2015 1357   BILIRUBINUR small 07/18/2013 1817   KETONESUR NEGATIVE 11/19/2015 1225   PROTEINUR NEGATIVE 11/19/2015 1225   UROBILINOGEN 1.0 01/17/2015 1357   UROBILINOGEN 0.2 10/25/2014  0927   NITRITE NEGATIVE 11/19/2015 1225   LEUKOCYTESUR NEGATIVE 11/19/2015 1225    Radiological Exams on Admission: Ct Angio Chest Pe W Or Wo Contrast  Result Date: 11/19/2015 CLINICAL DATA:  Chest pain for several days, initial encounter EXAM: CT ANGIOGRAPHY CHEST WITH CONTRAST TECHNIQUE: Multidetector CT imaging of the chest was performed using the standard protocol during bolus administration of intravenous contrast. Multiplanar CT image reconstructions and MIPs were obtained to evaluate the vascular anatomy. CONTRAST:  70 mL of Isovue 370. COMPARISON:  None. FINDINGS: Cardiovascular: The thoracic aorta shows no significant calcifications although evaluation is limited due to the lack of significant contrast enhancement. The pulmonary artery however shows adequate enhancement without definitive filling defect to suggest pulmonary embolism. Mediastinum/Nodes: No sizable hilar or mediastinal adenopathy is noted. No axillary adenopathy is seen. The thoracic inlet is within normal limits. The esophagus as visualized is unremarkable. Lungs/Pleura: The lungs are well aerated bilaterally. No focal infiltrate or sizable effusion is seen. A a single 2 mm subpleural nodule is noted in the right middle lobe best seen on image number 69 of series 5. Upper Abdomen: No acute abnormality. Musculoskeletal: No chest wall abnormality. No acute or significant osseous findings. Review of the MIP images confirms the above findings. IMPRESSION: No evidence of pulmonary emboli. 2 mm subpleural nodule on the right. No follow-up needed if patient is low-risk. Non-contrast chest CT can be considered in 12 months if patient is high-risk. This recommendation follows the consensus statement: Guidelines for Management of Incidental Pulmonary Nodules Detected on CT Images: From the Fleischner Society 2017; Radiology 2017; 284:228-243. Electronically Signed   By: Inez Catalina M.D.   On: 11/19/2015 19:37   Mr Brain Wo Contrast (neuro  Protocol)  Result Date: 11/19/2015 CLINICAL DATA:  Posterior headache and right arm numbness. EXAM: MRI HEAD WITHOUT CONTRAST MRA HEAD WITHOUT CONTRAST TECHNIQUE: Multiplanar, multiecho pulse sequences of the brain and surrounding structures were obtained without intravenous contrast. Angiographic images of the  head were obtained using MRA technique without contrast. COMPARISON:  09/02/2014 brain MRI FINDINGS: MRI HEAD FINDINGS Brain: Normal. No acute infarction, hemorrhage, hydrocephalus, extra-axial collection or mass lesion. Vascular: Normal flow voids. Skull and upper cervical spine: Normal marrow signal. Sinuses/Orbits: Negative. MRA HEAD FINDINGS Comparatively small, but smooth right ICA in the setting of hypoplastic right A1 segment. Intact circle-of-Willis. Symmetric vertebral arteries with standard vertebrobasilar branching. No major branch occlusion, beading, or flow limiting stenosis. IMPRESSION: Normal brain MRI and intracranial MRA. Electronically Signed   By: Monte Fantasia M.D.   On: 11/19/2015 17:28   Mr Jodene Nam Head (cerebral Arteries)  Result Date: 11/19/2015 CLINICAL DATA:  Posterior headache and right arm numbness. EXAM: MRI HEAD WITHOUT CONTRAST MRA HEAD WITHOUT CONTRAST TECHNIQUE: Multiplanar, multiecho pulse sequences of the brain and surrounding structures were obtained without intravenous contrast. Angiographic images of the head were obtained using MRA technique without contrast. COMPARISON:  09/02/2014 brain MRI FINDINGS: MRI HEAD FINDINGS Brain: Normal. No acute infarction, hemorrhage, hydrocephalus, extra-axial collection or mass lesion. Vascular: Normal flow voids. Skull and upper cervical spine: Normal marrow signal. Sinuses/Orbits: Negative. MRA HEAD FINDINGS Comparatively small, but smooth right ICA in the setting of hypoplastic right A1 segment. Intact circle-of-Willis. Symmetric vertebral arteries with standard vertebrobasilar branching. No major branch occlusion, beading, or  flow limiting stenosis. IMPRESSION: Normal brain MRI and intracranial MRA. Electronically Signed   By: Monte Fantasia M.D.   On: 11/19/2015 17:28    EKG: Independently reviewed. Noted above.  Assessment/Plan Principal Problem:   Chest pain Active Problems:   DM type 1 (diabetes mellitus, type 1) (HCC)   Fibromyalgia   Elevated troponin   Migraine headache   Neck pain     Chest pain with cardiac risk factors, heart score 4.  Mild troponin elevation with initial labs but no acute EKG changes. --Place in observation with telemetry monitoring. --Serial troponin; would like to see trend before considering anticoagulation --SL nitroglycerin spray prn for chest pain;  Will consider adding nitropaste if chest pain still present after GI cocktail and pepcid. --If marked elevation in repeat troponin, will notify cardiology.  Otherwise, I am expecting she will be a candidate for stress test in the AM if chest pain free and troponin stable/decreasing. --A1c and fasting lipid panel --Complete echo in the AM --NPO after midnight  Type 1 DM --Continue home dose of levemir --Aggressive SSI coverage AC/HS --Checking A1c --Needs PCP referral  Neck pain --CT cervical spine without contrast --Consider outpatient PT referral  Headache, possible migraine variant --RESOLVED; normal MRI/MRA brain  Incidental finding of pulmonary nodule on chest CT --Outpatient follow-up  Yeast on U/A --One time dose of diflucan  DVT prophylaxis: Low risk, observation status Code Status: FULL Family Communication: Patient alone in the ED at time of admission. Disposition Plan: Expect she will discharge to home with outpatient follow-up if cardiac evaluation is negative.  She will need social services consult in the AM for PCP referral. Consults called: NONE Admission status: Place in observation, stepdown unit due to active chest pain   TIME SPENT: 70 minutes   Eber Jones MD [facility] Pager [phone]  If 7PM-7AM, please contact night-coverage [email or web address] Password [email or web address]  11/19/2015, 8:32 PM

## 2015-11-19 NOTE — ED Triage Notes (Signed)
C/o HA, right arm numbness since Sunday-NAD-steady gait

## 2015-11-19 NOTE — ED Provider Notes (Addendum)
[facility]PT [facility] Provider Note   CSN: [id number] Arrival date & time: 11/19/15  1130     History   Chief Complaint Chief Complaint  Patient presents with  . Headache    HPI Maureen [name] is a 45 y.o. female.  HPI Patient reports that she developed a sudden headache on Sunday. She reports it was fairly severe at that time. It was in the back of her head. She reports she also had blurred vision in her left eye. The patient reports that she was able to rest and applied an ice pack to her head and after several hours his symptoms improved. She reports that she seemed better yesterday. This morning she awakened at 8 AM. She reports she awakened and noted that her left arm felt numb and slightly heavy. She reports that it is functioning. She reports she has a history of neuropathy but this does not feel like a typical tingling "gone to sleep" feeling of her neuropathy. She denies a difficulty with gait. She denies that the blurred vision returned. She does report she has had headache again is now involves her forehead. She endorses nausea but no vomiting. She reports distant history of migraines. She reports this does not feel similar to any migraine she's had. She denies any history of weakness numbness or tingling in association with previous migraines. Past Medical History:  Diagnosis Date  . Abdominal pain   . Anxiety   . Arthritis   . Biliary dyskinesia   . Cervical strain   . Closed head injury 2010  . Depression   . Fibromyalgia   . Hyperlipidemia   . Hypoglycemia   . Joint pain   . Major depressive disorder   . Migraine    hx of none recent  . Nausea & vomiting   . Post traumatic stress disorder (PTSD)   . Pulmonary embolism ([place])   . Renal stones   . Skin rash    due to medications  . Sleep trouble   . Stroke ([place])   . TIA (transient ischemic attack)   . Well controlled type 1 diabetes mellitus with peripheral neuropathy [facility])     Patient Active  Problem List   Diagnosis Date Noted  . Nausea   . Acute kidney injury ([place]) 02/19/2015  . Left flank pain 02/19/2015  . DKA (diabetic ketoacidoses) ([place]) 10/24/2014  . HLD (hyperlipidemia) 10/24/2014  . Hx pulmonary embolism 09/28/2014  . Nausea vomiting and diarrhea 09/03/2014  . Abdominal pain 09/03/2014  . Abnormal LFTs 09/03/2014  . Dizziness 09/03/2014  . Acute pulmonary embolism ([place]) 09/03/2014  . Transaminitis 09/02/2014  . GAD (generalized anxiety disorder) 11/23/2013  . PTSD (post-traumatic stress disorder) 11/23/2013  . Severe major depression without psychotic features ([place]) 10/10/2013  . Major depression ([place]) 05/22/2013  . Chest pain 05/18/2012  . Endometriosis 12/08/2011  . Arthritis of left knee 12/08/2011  . Diabetic retinopathy ([place]) 12/08/2011  . Fibromyalgia 10/23/2011  . DM type 1 (diabetes mellitus, type 1) ([place]) 10/13/2011    Past Surgical History:  Procedure Laterality Date  . ABDOMINAL HYSTERECTOMY  2001  . APPENDECTOMY    . [place]  . CHOLECYSTECTOMY  02/10/2011   Procedure: LAPAROSCOPIC CHOLECYSTECTOMY WITH INTRAOPERATIVE CHOLANGIOGRAM;  Surgeon: Judieth Keens, DO;  Location: [facility];  Service: General;  Laterality: N/A;  . exploratory laparomty  Mar 04, 2012  . EYE SURGERY  2006   laser eye surgery left eye  . KNEE SURGERY Left  2012  . LAPAROSCOPIC APPENDECTOMY N/A 10/12/2012   Procedure: DIAGNOSTIC APPENDECTOMY LAPAROSCOPIC;  Surgeon: Madilyn Hook, DO;  Location: [facility];  Service: General;  Laterality: N/A;  . lipoma removed  yrs ago  . ROBOTIC ASSISTED LAPAROSCOPIC LYSIS OF ADHESION N/A 03/04/2012   Procedure: ROBOTIC ASSISTED LAPAROSCOPIC LYSIS OF EXTENSIVE ADHESIONS;  Surgeon: Claiborne Billings A. Pamala Hurry, MD;  Location: [facility];  Service: Gynecology;  Laterality: N/A;  . TONSILLECTOMY  2001    OB History    No data available       Home Medications    Prior to Admission medications   Medication Sig Start Date End Date  Taking? Authorizing Provider  aspirin 81 MG tablet Take 81 mg by mouth daily.    Historical Provider, MD  atorvastatin (LIPITOR) 40 MG tablet Take 40 mg by mouth daily.    Historical Provider, MD  insulin aspart (NOVOLOG) 100 UNIT/ML injection Inject 8 Units into the skin 3 (three) times daily with meals. 10/16/13   Kerrie Buffalo, NP  insulin glargine (LANTUS) 100 UNIT/ML injection Inject 26 Units into the skin at bedtime.     Historical Provider, MD  LANTUS SOLOSTAR 100 UNIT/ML Solostar Pen INJECT 25 UNITS SUBCUTANEOUSLY ONCE DAILY 03/24/15   Historical Provider, MD  lidocaine (LIDODERM) 5 % Place 1 patch onto the skin over 24 hr. 10/30/15   Historical Provider, MD  NARCAN 4 MG/0.1ML LIQD Place 1 spray into the nose as needed. 11/13/15   Historical Provider, MD  traZODone (DESYREL) 50 MG tablet Take 0.5-1 tablets (25-50 mg total) by mouth at bedtime as needed for sleep. Patient not taking: Reported on 11/18/2015 10/25/15   Wendie Agreste, MD    Family History Family History  Problem Relation Age of Onset  . Cancer Father     lymphoma  . Nephrolithiasis Father   . Vascular Disease Father   . Alcohol abuse Father   . Drug abuse Father   . Cancer Maternal Grandmother     colon  . Hypertension Mother   . Heart disease Mother   . Cataracts Mother   . Depression Mother   . Diabetes Brother   . Drug abuse Brother   . Mental illness Maternal Grandfather   . Cancer Maternal Grandfather   . Heart disease Paternal Grandmother   . Heart disease Paternal Grandfather   . Suicidality Maternal Uncle     Social History Social History  Substance Use Topics  . Smoking status: Never Smoker  . Smokeless tobacco: Never Used  . Alcohol use No     Allergies   Cephalexin; Ibuprofen; and Meloxicam   Review of Systems Review of Systems 10 Systems reviewed and are negative for acute change except as noted in the HPI.  Physical Exam Updated Vital Signs BP 130/73   Pulse 85   Temp 98.2 F  (36.8 C) (Oral)   Resp 26   Ht 5\' 4"  (1.626 m)   Wt 176 lb (79.8 kg)   SpO2 99%   BMI 30.21 kg/m   Physical Exam  Constitutional: She is oriented to person, place, and time. She appears well-developed and well-nourished. No distress.  HENT:  Head: Normocephalic and atraumatic.  Nose: Nose normal.  Mouth/Throat: Oropharynx is clear and moist.  Eyes: Conjunctivae and EOM are normal. Pupils are equal, round, and reactive to light.  Neck: Neck supple.  Cardiovascular: Normal rate and regular rhythm.   No murmur heard. Pulmonary/Chest: Effort normal and breath sounds normal. No respiratory distress.  Abdominal: Soft.  There is no tenderness.  Musculoskeletal: She exhibits no edema.  Neurological: She is alert and oriented to person, place, and time. No cranial nerve deficit. She exhibits normal muscle tone. Coordination normal.  Normal cognitive function. Speech is clear. 5\5 motor strength for upper and lower extremity strength testing. No pronator drift. Intact finger nose. Patient does report subjective difference to light touch. She reports right upper extremity with decreased sensation relative to left upper extremity. Bilateral lower extremities endorse to be equal to light touch.  Skin: Skin is warm and dry.  Psychiatric: She has a normal mood and affect.  Nursing note and vitals reviewed.    ED Treatments / Results  Labs (all labs ordered are listed, but only abnormal results are displayed) Labs Reviewed  COMPREHENSIVE METABOLIC PANEL - Abnormal; Notable for the following:       Result Value   Glucose, Bld 245 (*)    Creatinine, Ser 1.11 (*)    GFR calc non Af Amer 60 (*)    All other components within normal limits  CBC WITH DIFFERENTIAL/PLATELET - Abnormal; Notable for the following:    WBC 12.7 (*)    Platelets 437 (*)    Neutro Abs 9.6 (*)    All other components within normal limits  URINALYSIS, ROUTINE W REFLEX MICROSCOPIC (NOT AT [facility]) - Abnormal; Notable for the  following:    APPearance CLOUDY (*)    Glucose, UA >1000 (*)    All other components within normal limits  TROPONIN I - Abnormal; Notable for the following:    Troponin I 0.07 (*)    All other components within normal limits  URINE MICROSCOPIC-ADD ON - Abnormal; Notable for the following:    Squamous Epithelial / LPF 6-30 (*)    Bacteria, UA MANY (*)    All other components within normal limits  PROTIME-INR  APTT  RAPID URINE DRUG SCREEN, HOSP PERFORMED    EKG  EKG Interpretation  Date/Time:  Tuesday November 19 2015 12:38:27 EDT Ventricular Rate:  85 PR Interval:    QRS Duration: 81 QT Interval:  367 QTC Calculation: 437 R Axis:   85 Text Interpretation:  Sinus rhythm normal. no change from previous Confirmed by Johnney Killian, MD, Jeannie Done [id number]) on 11/19/2015 1:58:25 PM       Radiology No results found.  Procedures Procedures (including critical care time)  Medications Ordered in ED Medications  metoCLOPramide (REGLAN) injection 10 mg (10 mg Intravenous Given 11/19/15 1236)  diphenhydrAMINE (BENADRYL) injection 25 mg (25 mg Intravenous Given 11/19/15 1235)     Initial Impression / Assessment and Plan / ED Course  I have reviewed the triage vital signs and the nursing notes.  Pertinent labs & imaging results that were available during my care of the patient were reviewed by me and considered in my medical decision making (see chart for details).  Clinical Course  Consult: 12:30 reviewed with Dr. Gari Crown of neurology. Will transfer to [facility] for MRI. Consult: Dr. Joselyn Glassman. Accepts for ED to ED transfer.  Final Clinical Impressions(s) / ED Diagnoses   Final diagnoses:  Acute nonintractable headache, unspecified headache type  Left arm numbness   Patient awakened this morning with left arm feeling numb and heavy. Patient does have motor intact. This episode was preceded by what patient qualifies as a severe headache on Sunday. At this time, I'll be for transfer to  [facility] to proceed with MRI MRA and neurology consultation. New Prescriptions New Prescriptions   No medications  on file     Charlesetta Shanks, MD 11/19/15 [place], MD 11/19/15 [phone]

## 2015-11-19 NOTE — ED Notes (Signed)
Patient transported to MRI 

## 2015-11-20 ENCOUNTER — Observation Stay (HOSPITAL_COMMUNITY): Payer: BLUE CROSS/BLUE SHIELD

## 2015-11-20 DIAGNOSIS — G43009 Migraine without aura, not intractable, without status migrainosus: Secondary | ICD-10-CM | POA: Diagnosis not present

## 2015-11-20 DIAGNOSIS — E1065 Type 1 diabetes mellitus with hyperglycemia: Secondary | ICD-10-CM

## 2015-11-20 DIAGNOSIS — R0789 Other chest pain: Secondary | ICD-10-CM | POA: Diagnosis not present

## 2015-11-20 DIAGNOSIS — R748 Abnormal levels of other serum enzymes: Secondary | ICD-10-CM

## 2015-11-20 LAB — GLUCOSE, CAPILLARY: Glucose-Capillary: 255 mg/dL — ABNORMAL HIGH (ref 65–99)

## 2015-11-20 LAB — CBC
HCT: 36.7 % (ref 36.0–46.0)
Hemoglobin: 12.2 g/dL (ref 12.0–15.0)
MCH: 31 pg (ref 26.0–34.0)
MCHC: 33.2 g/dL (ref 30.0–36.0)
MCV: 93.4 fL (ref 78.0–100.0)
Platelets: 393 10*3/uL (ref 150–400)
RBC: 3.93 MIL/uL (ref 3.87–5.11)
RDW: 13.3 % (ref 11.5–15.5)
WBC: 11.1 10*3/uL — ABNORMAL HIGH (ref 4.0–10.5)

## 2015-11-20 LAB — BASIC METABOLIC PANEL
Anion gap: 7 (ref 5–15)
BUN: 11 mg/dL (ref 6–20)
CO2: 25 mmol/L (ref 22–32)
Calcium: 8.7 mg/dL — ABNORMAL LOW (ref 8.9–10.3)
Chloride: 104 mmol/L (ref 101–111)
Creatinine, Ser: 0.99 mg/dL (ref 0.44–1.00)
GFR calc Af Amer: >60 mL/min (ref >60)
GFR calc non Af Amer: >60 mL/min (ref >60)
Glucose, Bld: 333 mg/dL — ABNORMAL HIGH (ref 65–99)
Potassium: 4.6 mmol/L (ref 3.5–5.1)
Sodium: 136 mmol/L (ref 135–145)

## 2015-11-20 LAB — LIPID PANEL
Cholesterol: 153 mg/dL (ref 0–200)
HDL: 52 mg/dL (ref >40)
LDL Cholesterol: 80 mg/dL (ref 0–99)
Total CHOL/HDL Ratio: 2.9 RATIO
Triglycerides: 104 mg/dL (ref <150)
VLDL: 21 mg/dL (ref 0–40)

## 2015-11-20 LAB — TROPONIN I
Troponin I: <0.03 ng/mL (ref <0.03)
Troponin I: 0.03 ng/mL (ref <0.03)

## 2015-11-20 MED ORDER — ACETAMINOPHEN 500 MG PO TABS: 500.0000 mg | ORAL_TABLET | Freq: Three times a day (TID) | ORAL | 0 refills | Status: DC | PRN

## 2015-11-20 MED ORDER — INSULIN GLARGINE 100 UNIT/ML ~~LOC~~ SOLN: 26.0000 [IU] | Freq: Every day | SUBCUTANEOUS | 0 refills | Status: DC

## 2015-11-20 MED ORDER — INSULIN ASPART 100 UNIT/ML ~~LOC~~ SOLN: 8.0000 [IU] | Freq: Three times a day (TID) | SUBCUTANEOUS | 0 refills | Status: DC

## 2015-11-20 MED ORDER — GI COCKTAIL ~~LOC~~
30.0000 mL | Freq: Once | ORAL | Status: AC
  Administered 2015-11-20: 30 mL via ORAL
  Filled 2015-11-20: qty 30

## 2015-11-20 NOTE — Care Management Note (Signed)
Case Management Note  Patient Details  Name: Maureen [name] MRN: [id number] Date of Birth: 03-Jul-1970  Subjective/Objective:     Headache, chest pain               Action/Plan: Discharge Planning: AVS reviewed:  Chart reviewed. No NCM needs identified.  PCP- [name] MD  Expected Discharge Date:  11/20/2015               Expected Discharge Plan:  Home/Self Care  In-House Referral:  NA  Discharge planning Services  CM Consult  Post Acute Care Choice:  NA Choice offered to:  NA  DME Arranged:  N/A DME Agency:  NA  HH Arranged:  NA HH Agency:  NA  Status of Service:  Completed, signed off  If discussed at [place] of Stay Meetings, dates discussed:    Additional Comments:  Erenest Rasher, RN 11/20/2015, 9:38 AM

## 2015-11-20 NOTE — Discharge Summary (Addendum)
Physician Discharge Summary  Maureen [name] [id number] DOB: April 04, 1970 DOA: 11/19/2015  PCP: Maureen Haber, MD  Admit date: 11/19/2015 Discharge date: 11/20/2015  Admitted From: Home  Disposition:  Home   Recommendations for Outpatient Follow-up:  1. Follow up with PCP in 1 week 2. Cardiology will arrange her to have outpatient stress test 3. Note that patient had a 2 mm subpleural nodule on the right lung.    Home Health: No  Equipment/Devices: No   Discharge Condition: Home   CODE STATUS: Full   Diet recommendation: Heart Healthy / Carb Modified   Brief/Interim Summary: This is a 45 year old female who presented to hospital chief complaint of headache. She does have chronic pain, fibromyalgia and neuropathy. She was initially evaluated at [facility] urgent care for headache, of 2 days' duration, it was intermittent, frontal, she was transferred to [facility] for MRI. Her headache improved with Toradol, Benadryl and Reglan. Along with her headache she has been experiencing epigastric pain, nonexertional. Associated with mild nausea but no vomiting. She has had multiple ED visits in the past for chest pain apparently all rule out for acute coronary syndrome. She is physically active and denies any angina. On the physical examination her blood pressure was 158/53, heart rate 90, respiratory rate 21, oxygen saturation 100%. She was awake and alert, her mucous membranes were moist, her lungs were clear to auscultation, heart S1-S2 present rhythmic, her abdomen was soft and nondistended, her lower extremities had no edema. Her sodium 137, potassium 4.1, creatinine 1.11, BUN 13, troponin 0.07, white count 12.7, hemoglobin 14.3, hematocrit 42.3, platelets 437. Her urinalysis was negative for infection, noted more in 1000  glucose. UDS was negative. Her EKG was normal sinus rhythm. Her CT chest was negative for poor embolism. 2 mm subpleural nodule on the right.   The patient was admitted  to hospital with the working diagnosis of headache and chest pain complicated by mild troponin elevation.  1. Chest pain. Patient had no further chest pain, denies any angina, her troponins remain negative to 0.03. Patient will continue aspirin and atorvastatin. I contacted cardiology and outpatient stress test will be arranged. Patient was informed about the plan.  2. Headache. Possibly migraine related, has significantly improved, patient will take acetaminophen as needed. MRI, MRA normal.   3. Type 1 diabetes mellitus. She will continue insulin regimen with sliding scale and glargine. Capillary glucose 146 to 255.   4. Neck pain. CT with no evidence of fracture or subluxation along the cervical spine.   5. Yeast on urinalysis, patient had 1 dose Diflucan  6. Subcentimeter lung nodule. Patient denies any tobacco abuse, per Fleischner criteria no need to follow up when patient low risk.    Discharge Diagnoses:  Principal Problem:   Chest pain Active Problems:   DM type 1 (diabetes mellitus, type 1) (HCC)   Fibromyalgia   Elevated troponin   Migraine headache   Neck pain    Discharge Instructions  Discharge Instructions    Diet - low sodium heart healthy    Complete by:  As directed    Discharge instructions    Complete by:  As directed    Please follow with primary care in 7 days. Please follow with cardiology for outpatient stress test.   Increase activity slowly    Complete by:  As directed        Medication List    TAKE these medications   acetaminophen 500 MG tablet Commonly known as:  TYLENOL  Take 1 tablet (500 mg total) by mouth every 8 (eight) hours as needed for headache. What changed:  how much to take  when to take this  reasons to take this   aspirin 81 MG tablet Take 81 mg by mouth daily.   atorvastatin 40 MG tablet Commonly known as:  LIPITOR Take 40 mg by mouth daily.   insulin aspart 100 UNIT/ML injection Commonly known as:   novoLOG Inject 8 Units into the skin 3 (three) times daily with meals.   insulin glargine 100 UNIT/ML injection Commonly known as:  LANTUS Inject 0.26 mLs (26 Units total) into the skin at bedtime.   lidocaine 5 % Commonly known as:  LIDODERM Place 1 patch onto the skin over 24 hr.      Follow-up Information    Maureen Haber, MD Follow up in 1 week(s).   Specialty:  Family Medicine Contact information: [address] [place] [id number] [phone]          Allergies  Allergen Reactions  . Cephalexin Rash    Pt was admitted to the hospital upon taking.  . Ibuprofen Nausea And Vomiting and Rash  . Meloxicam Nausea Only    Consultations:  Outpatient stress test will be arranged, by cardiology.   Procedures/Studies: Ct Angio Chest Pe W Or Wo Contrast  Result Date: 11/19/2015 CLINICAL DATA:  Chest pain for several days, initial encounter EXAM: CT ANGIOGRAPHY CHEST WITH CONTRAST TECHNIQUE: Multidetector CT imaging of the chest was performed using the standard protocol during bolus administration of intravenous contrast. Multiplanar CT image reconstructions and MIPs were obtained to evaluate the vascular anatomy. CONTRAST:  70 mL of Isovue 370. COMPARISON:  None. FINDINGS: Cardiovascular: The thoracic aorta shows no significant calcifications although evaluation is limited due to the lack of significant contrast enhancement. The pulmonary artery however shows adequate enhancement without definitive filling defect to suggest pulmonary embolism. Mediastinum/Nodes: No sizable hilar or mediastinal adenopathy is noted. No axillary adenopathy is seen. The thoracic inlet is within normal limits. The esophagus as visualized is unremarkable. Lungs/Pleura: The lungs are well aerated bilaterally. No focal infiltrate or sizable effusion is seen. A a single 2 mm subpleural nodule is noted in the right middle lobe best seen on image number 69 of series 5. Upper Abdomen: No acute  abnormality. Musculoskeletal: No chest wall abnormality. No acute or significant osseous findings. Review of the MIP images confirms the above findings. IMPRESSION: No evidence of pulmonary emboli. 2 mm subpleural nodule on the right. No follow-up needed if patient is low-risk. Non-contrast chest CT can be considered in 12 months if patient is high-risk. This recommendation follows the consensus statement: Guidelines for Management of Incidental Pulmonary Nodules Detected on CT Images: From the Fleischner Society 2017; Radiology 2017; 284:228-243. Electronically Signed   By: Inez Catalina M.D.   On: 11/19/2015 19:37   Ct Cervical Spine Wo Contrast  Result Date: 11/20/2015 CLINICAL DATA:  Acute onset of headache, radiating to the neck. Initial encounter. EXAM: CT CERVICAL SPINE WITHOUT CONTRAST TECHNIQUE: Multidetector CT imaging of the cervical spine was performed without intravenous contrast. Multiplanar CT image reconstructions were also generated. COMPARISON:  MRI of the cervical spine performed 10/17/2009 FINDINGS: Alignment: Normal. Skull base and vertebrae: No acute fracture. No primary bone lesion or focal pathologic process. There is incomplete fusion of the posterior arch of C1. Soft tissues and spinal canal: No prevertebral fluid or swelling. No visible canal hematoma. Disc levels: Intervertebral disc spaces are preserved. The bony foramina are  grossly unremarkable. Upper chest: Mild calcification is noted at the carotid bifurcations bilaterally. The visualized lung apices are clear. Other: The visualized portions of the brain are unremarkable. IMPRESSION: 1. No evidence of fracture or subluxation along the cervical spine. 2. Mild calcification at the carotid bifurcations bilaterally. Carotid ultrasound could be considered for further evaluation, when and as deemed clinically appropriate. Electronically Signed   By: Garald Balding M.D.   On: 11/20/2015 02:49   Mr Brain Wo Contrast (neuro  Protocol)  Result Date: 11/19/2015 CLINICAL DATA:  Posterior headache and right arm numbness. EXAM: MRI HEAD WITHOUT CONTRAST MRA HEAD WITHOUT CONTRAST TECHNIQUE: Multiplanar, multiecho pulse sequences of the brain and surrounding structures were obtained without intravenous contrast. Angiographic images of the head were obtained using MRA technique without contrast. COMPARISON:  09/02/2014 brain MRI FINDINGS: MRI HEAD FINDINGS Brain: Normal. No acute infarction, hemorrhage, hydrocephalus, extra-axial collection or mass lesion. Vascular: Normal flow voids. Skull and upper cervical spine: Normal marrow signal. Sinuses/Orbits: Negative. MRA HEAD FINDINGS Comparatively small, but smooth right ICA in the setting of hypoplastic right A1 segment. Intact circle-of-Willis. Symmetric vertebral arteries with standard vertebrobasilar branching. No major branch occlusion, beading, or flow limiting stenosis. IMPRESSION: Normal brain MRI and intracranial MRA. Electronically Signed   By: Monte Fantasia M.D.   On: 11/19/2015 17:28   Mr Jodene Nam Head (cerebral Arteries)  Result Date: 11/19/2015 CLINICAL DATA:  Posterior headache and right arm numbness. EXAM: MRI HEAD WITHOUT CONTRAST MRA HEAD WITHOUT CONTRAST TECHNIQUE: Multiplanar, multiecho pulse sequences of the brain and surrounding structures were obtained without intravenous contrast. Angiographic images of the head were obtained using MRA technique without contrast. COMPARISON:  09/02/2014 brain MRI FINDINGS: MRI HEAD FINDINGS Brain: Normal. No acute infarction, hemorrhage, hydrocephalus, extra-axial collection or mass lesion. Vascular: Normal flow voids. Skull and upper cervical spine: Normal marrow signal. Sinuses/Orbits: Negative. MRA HEAD FINDINGS Comparatively small, but smooth right ICA in the setting of hypoplastic right A1 segment. Intact circle-of-Willis. Symmetric vertebral arteries with standard vertebrobasilar branching. No major branch occlusion, beading, or  flow limiting stenosis. IMPRESSION: Normal brain MRI and intracranial MRA. Electronically Signed   By: Monte Fantasia M.D.   On: 11/19/2015 17:28       Subjective: Patient is feeling much better, headache has improved, no chest pain. No dyspnea, no nausea or vomiting.  Discharge Exam: Vitals:   11/20/15 0430 11/20/15 0801  BP: 120/60 125/64  Pulse:    Resp: 14   Temp:  97.8 F (36.6 C)   Vitals:   11/19/15 2100 11/19/15 2130 11/20/15 0430 11/20/15 0801  BP: 119/63  120/60 125/64  Pulse: 73     Resp: 16 20 14    Temp:    97.8 F (36.6 C)  TempSrc:    Oral  SpO2: 97%   96%  Weight:  79.5 kg (175 lb 4.3 oz)    Height:  5\' 4"  (1.626 m)      General: Pt is alert, awake, not in acute distress Cardiovascular: RRR, S1/S2 +, no rubs, no gallops Respiratory: CTA bilaterally, no wheezing, no rhonchi Abdominal: Soft, NT, ND, bowel sounds +, mild pain in the epigastric region. Extremities: no edema, no cyanosis    The results of significant diagnostics from this hospitalization (including imaging, microbiology, ancillary and laboratory) are listed below for reference.     Microbiology: No results found for this or any previous visit (from the past 240 hour(s)).   Labs: BNP (last 3 results)  Recent Labs  06/26/15 1952  BNP [id number]   Basic Metabolic Panel:  Recent Labs Lab 11/19/15 1150 11/20/15 0346  NA 137 136  K 4.1 4.6  CL 101 104  CO2 29 25  GLUCOSE 245* 333*  BUN 13 11  CREATININE 1.11* 0.99  CALCIUM 9.4 8.7*   Liver Function Tests:  Recent Labs Lab 11/19/15 1150  AST 15  ALT 16  ALKPHOS 110  BILITOT 0.6  PROT 7.8  ALBUMIN 3.8   No results for input(s): LIPASE, AMYLASE in the last 168 hours. No results for input(s): AMMONIA in the last 168 hours. CBC:  Recent Labs Lab 11/19/15 1150 11/20/15 0346  WBC 12.7* 11.1*  NEUTROABS 9.6*  --   HGB 14.3 12.2  HCT 42.3 36.7  MCV 93.0 93.4  PLT 437* 393   Cardiac Enzymes:  Recent Labs Lab  11/19/15 1150 11/19/15 2152 11/20/15 0041 11/20/15 0346  TROPONINI 0.07* <0.03 <0.03 0.03*   BNP: Invalid input(s): POCBNP CBG:  Recent Labs Lab 11/19/15 2128 11/20/15 0801  GLUCAP 146* 255*   D-Dimer No results for input(s): DDIMER in the last 72 hours. Hgb A1c No results for input(s): HGBA1C in the last 72 hours. Lipid Profile  Recent Labs  11/20/15 0346  CHOL 153  HDL 52  LDLCALC 80  TRIG 104  CHOLHDL 2.9   Thyroid function studies No results for input(s): TSH, T4TOTAL, T3FREE, THYROIDAB in the last 72 hours.  Invalid input(s): FREET3 Anemia work up No results for input(s): VITAMINB12, FOLATE, FERRITIN, TIBC, IRON, RETICCTPCT in the last 72 hours. Urinalysis    Component Value Date/Time   COLORURINE YELLOW 11/19/2015 1225   APPEARANCEUR CLOUDY (A) 11/19/2015 1225   LABSPEC 1.026 11/19/2015 1225   PHURINE 6.0 11/19/2015 1225   GLUCOSEU >1000 (A) 11/19/2015 1225   HGBUR NEGATIVE 11/19/2015 [place] 11/19/2015 1225   BILIRUBINUR negative 01/17/2015 1357   BILIRUBINUR small 07/18/2013 1817   KETONESUR NEGATIVE 11/19/2015 1225   PROTEINUR NEGATIVE 11/19/2015 1225   UROBILINOGEN 1.0 01/17/2015 1357   UROBILINOGEN 0.2 10/25/2014 0927   NITRITE NEGATIVE 11/19/2015 1225   LEUKOCYTESUR NEGATIVE 11/19/2015 1225   Sepsis Labs Invalid input(s): PROCALCITONIN,  WBC,  LACTICIDVEN Microbiology No results found for this or any previous visit (from the past 240 hour(s)).   Time coordinating discharge: 45 minutes  SIGNED:   Tawni Millers, MD  [facility] 11/20/2015, 9:24 AM Pager   If 7PM-7AM, please contact night-coverage [email or web address] Password [email or web address]

## 2015-11-21 ENCOUNTER — Other Ambulatory Visit: Payer: Self-pay | Admitting: Family Medicine

## 2015-11-21 ENCOUNTER — Telehealth (INDEPENDENT_AMBULATORY_CARE_PROVIDER_SITE_OTHER): Payer: Self-pay

## 2015-11-21 DIAGNOSIS — M47817 Spondylosis without myelopathy or radiculopathy, lumbosacral region: Secondary | ICD-10-CM | POA: Diagnosis not present

## 2015-11-21 DIAGNOSIS — E1142 Type 2 diabetes mellitus with diabetic polyneuropathy: Secondary | ICD-10-CM | POA: Diagnosis not present

## 2015-11-21 DIAGNOSIS — B0229 Other postherpetic nervous system involvement: Secondary | ICD-10-CM | POA: Diagnosis not present

## 2015-11-21 DIAGNOSIS — G894 Chronic pain syndrome: Secondary | ICD-10-CM | POA: Diagnosis not present

## 2015-11-21 LAB — HEMOGLOBIN A1C
Hgb A1c MFr Bld: 10.4 % — ABNORMAL HIGH (ref 4.8–5.6)
Mean Plasma Glucose: 252 mg/dL

## 2015-11-21 MED ORDER — GABAPENTIN 300 MG PO CAPS: 300.0000 mg | ORAL_CAPSULE | Freq: Every day | ORAL | 0 refills | Status: DC

## 2015-11-21 NOTE — Telephone Encounter (Signed)
Ok refill? 

## 2015-11-21 NOTE — Telephone Encounter (Signed)
Ok refill, 300 mg daily qhs

## 2015-11-21 NOTE — Telephone Encounter (Signed)
[facility] called and wanted a refill on Gabapentin 100 mg

## 2015-11-21 NOTE — Telephone Encounter (Signed)
Rx sent to pharmacy   

## 2015-11-21 NOTE — Telephone Encounter (Signed)
Trazodone prescribed in July visit with me, with plan on follow up in 1 month. Also referred to sleep specialist. By notes, she did not return calls to schedule an appointment after multiple attempts. Follow up to discuss med further and it appears she was just discharged from hospital and advised follow up with PCP within a week.

## 2015-11-21 NOTE — Telephone Encounter (Signed)
This med was removed from list at last visit as not using? Last seen 07/2015

## 2015-11-22 NOTE — Telephone Encounter (Signed)
Done and sent to pharmacy via EPIC. Per Md to increased to 300mg  qhs.

## 2015-11-29 ENCOUNTER — Encounter: Payer: Self-pay | Admitting: Cardiology

## 2015-11-29 ENCOUNTER — Ambulatory Visit (INDEPENDENT_AMBULATORY_CARE_PROVIDER_SITE_OTHER): Payer: BLUE CROSS/BLUE SHIELD | Admitting: Cardiology

## 2015-11-29 VITALS — BP 138/78 | HR 74 | Ht 64.0 in | Wt 179.0 lb

## 2015-11-29 DIAGNOSIS — E785 Hyperlipidemia, unspecified: Secondary | ICD-10-CM

## 2015-11-29 DIAGNOSIS — R079 Chest pain, unspecified: Secondary | ICD-10-CM | POA: Diagnosis not present

## 2015-11-29 NOTE — Progress Notes (Signed)
11/29/2015 Maureen [name]   1970-05-06  [id number]  Primary Physician Maureen Haber, MD Primary Cardiologist: New (Dr. Saunders [name])   Reason for Visit/CC: New Patient Evaluation for Chest Pain Referring MD: [facility] ED  HPI:  Maureen [name] is a 45 year old AA female who presents to our clinic as a new patient. She has been referred to [place] by the [facility] emergency department. Referral was placed for evaluation for chest pain. She has multiple risk factors for heart disease. She is a type I diabetic which was first diagnosed at the age of 87. She is on insulin. She reports that this is poorly controlled. Her most recent hemoglobin A1c was 10.4. She also has a history of hyperlipidemia. Her last lipid panel, on file, is from a year ago. LDL was 150. She reports that since then she has been started on Lipitor, 40 mg daily. She denies any personal history of hypertension. No history of tobacco use. She does note a strong family history of premature CAD. She reports that her mother suffered a myocardial infarction at the age of 19. Her mother also has congestive heart failure. Her paternal grandfather also had myocardial infarction in his 56s. The patient does report a personal history of pulmonary embolism diagnosed in July 2016. This was in the setting of prolonged travel. She was on Xarelto x 3 months and then this was discontinued. She reports that hypercoagulable workup was negative. She also notes a remote history of TIA.  She was recently seen in the [facility] emergency department on 11/19/2015. Her chief complaint was a severe headache however she also mentioned recent symptoms of chest discomfort. Her headache workup was unremarkable. Troponin was checked and initial troponin was elevated at 0.07. Second and third tropoinin levels were cycled and were normal at 0.03. EKG showed no ischemic abnormalities. She also underwent workup for PE. CT of chest was negative for PE. She was discharged home from the  emergency departments with plans to follow up in our office for further evaluation.  She is currently chest pain-free but reports that since being seen in the ED she has continued to have on and off chest discomfort and exertional dyspnea. Chest discomfort is a mix of both typical and atypical features. Her chest pain is sometimes sharp but also sometimes dull and pressure-like. She has mild chest discomfort walking up a flight of stairs. She is also short of breath walking up a flight of stairs. Her EKG currently shows normal sinus rhythm with no ischemic abnormalities. Her blood pressures 142/74. Heart rate 72 bpm. O2 sats 96% on room air.    Current Meds  Medication Sig  . acetaminophen (TYLENOL) 500 MG tablet Take 1 tablet (500 mg total) by mouth every 8 (eight) hours as needed for headache.  Marland Kitchen aspirin EC 325 MG tablet Take 325 mg by mouth daily.  Marland Kitchen atorvastatin (LIPITOR) 40 MG tablet Take 40 mg by mouth daily.  Marland Kitchen gabapentin (NEURONTIN) 300 MG capsule Take 1 capsule (300 mg total) by mouth at bedtime.  . insulin aspart (NOVOLOG) 100 UNIT/ML injection Inject 8 Units into the skin 3 (three) times daily with meals.  . insulin glargine (LANTUS) 100 UNIT/ML injection Inject 0.26 mLs (26 Units total) into the skin at bedtime.  . lidocaine (LIDODERM) 5 % Place 1 patch onto the skin over 24 hr.  . oxyCODONE (OXY IR/ROXICODONE) 5 MG immediate release tablet TAKE ONE TABLET BY MOUTH EVERY 4 FOUR HIURS AS NEEDED FOR PAIN   Allergies  Allergen Reactions  . Cephalexin Rash    Pt was admitted to the hospital upon taking.  . Ibuprofen Nausea And Vomiting and Rash  . Meloxicam Nausea Only   Past Medical History:  Diagnosis Date  . Abdominal pain   . Anxiety   . Arthritis   . Biliary dyskinesia   . Cervical strain   . Closed head injury 2010  . Depression   . Fibromyalgia   . Hyperlipidemia   . Hypoglycemia   . Joint pain   . Major depressive disorder   . Migraine    hx of none recent  .  Nausea & vomiting   . Post traumatic stress disorder (PTSD)   . Pulmonary embolism ([place])   . Renal stones   . Skin rash    due to medications  . Sleep trouble   . Stroke ([place])   . TIA (transient ischemic attack)   . Well controlled type 1 diabetes mellitus with peripheral neuropathy ([place])    Family History  Problem Relation Age of Onset  . Cancer Father     lymphoma  . Nephrolithiasis Father   . Vascular Disease Father   . Alcohol abuse Father   . Drug abuse Father   . Cancer Maternal Grandmother     colon  . Hypertension Mother   . Heart disease Mother   . Cataracts Mother   . Depression Mother   . Diabetes Brother   . Drug abuse Brother   . Mental illness Maternal Grandfather   . Cancer Maternal Grandfather   . Heart disease Paternal Grandmother   . Heart disease Paternal Grandfather   . Suicidality Maternal Uncle    Past Surgical History:  Procedure Laterality Date  . ABDOMINAL HYSTERECTOMY  2001  . APPENDECTOMY    . [place]  . CHOLECYSTECTOMY  02/10/2011   Procedure: LAPAROSCOPIC CHOLECYSTECTOMY WITH INTRAOPERATIVE CHOLANGIOGRAM;  Surgeon: Maureen Keens, DO;  Location: [place] [facility];  Service: General;  Laterality: N/A;  . exploratory laparomty  Mar 04, 2012  . EYE SURGERY  2006   laser eye surgery left eye  . KNEE SURGERY Left 2012  . LAPAROSCOPIC APPENDECTOMY N/A 10/12/2012   Procedure: DIAGNOSTIC APPENDECTOMY LAPAROSCOPIC;  Surgeon: Maureen Hook, DO;  Location: [facility];  Service: General;  Laterality: N/A;  . lipoma removed  yrs ago  . ROBOTIC ASSISTED LAPAROSCOPIC LYSIS OF ADHESION N/A 03/04/2012   Procedure: ROBOTIC ASSISTED LAPAROSCOPIC LYSIS OF EXTENSIVE ADHESIONS;  Surgeon: Maureen Billings A. Pamala Hurry, MD;  Location: [facility];  Service: Gynecology;  Laterality: N/A;  . TONSILLECTOMY  2001   Social History   Social History  . Marital status: Divorced    Spouse name: Maureen [name]  . Number of children: 2  . Years of education: 12+    Occupational History  . 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 Smoking status: Never Smoker  . Smokeless tobacco: Never Used  . Alcohol use No  . Drug use: No  . Sexual activity: Not on file   Other Topics Concern  . Not on file   Social History Narrative   Lives with husband and two sons.  Before going back to school, she was a Research scientist (physical sciences) at [employer].  Stop working when she was diagnosed with endometriosis and had a TIA.     Review of Systems: General: negative for chills, fever, night sweats or weight changes.  Cardiovascular: negative for chest pain, dyspnea  on exertion, edema, orthopnea, palpitations, paroxysmal nocturnal dyspnea or shortness of breath Dermatological: negative for rash Respiratory: negative for cough or wheezing Urologic: negative for hematuria Abdominal: negative for nausea, vomiting, diarrhea, bright red blood per rectum, melena, or hematemesis Neurologic: negative for visual changes, syncope, or dizziness All other systems reviewed and are otherwise negative except as noted above.   Physical Exam:  Blood pressure 138/78, pulse 74, height 5\' 4"  (1.626 m), weight 179 lb (81.2 kg), SpO2 96 %.  General appearance: alert, cooperative and no distress Neck: no carotid bruit and no JVD Lungs: clear to auscultation bilaterally Heart: regular rate and rhythm, S1, S2 normal, no murmur, click, rub or gallop Extremities: extremities normal, atraumatic, no cyanosis or edema Pulses: 2+ and symmetric Skin: Skin color, texture, turgor normal. No rashes or lesions Neurologic: Grossly normal  EKG NSR. No ischemia.   ASSESSMENT AND PLAN:   1. Chest Pain with moderate risk for cardiac etiology: Pt with multiple risk factors including a long history of poorly controlled type 1 diabetes mellitus on insulin, family history of premature CAD with mother suffering myocardial infarction at the age of 42. Also with a history of  hyperlipidemia with LDL at 150 mg/dL one year ago. She has had recent chest pain and exertional dyspnea. Her EKG shows normal sinus rhythm without any ischemic abnormalities and she is currently asymptomatic in clinic today. I discussed patient case with Dr. Saunders [name], DOD, we have elected to further evaluate the patient with a stress echocardiogram to rule out underlying coronary ischemia as well as a transthoracic echocardiogram to assess LV function as well as valvular function.  2. Hyperlipidemia: She currently on Lipitor 40 mg. Her last lipid panel on file was a year ago and LDL was 150 mg/dL. We will arrange for her to have a repeat fasting lipid panel and hepatic function test when she returns for her stress echo. If LDL is not at goal, we will further increase her Lipitor.  3. Type 1 diabetes: On insulin therapy. Followed by her PCP:   PLAN  F/u after stress echo and TTE.   Lyda Jester PA-C 11/29/2015 2:51 PM

## 2015-11-29 NOTE — Patient Instructions (Addendum)
Medication Instructions:  Your physician recommends that you continue on your current medications as directed. Please refer to the Current Medication list given to you today.   Labwork: Your physician recommends that you return for a FASTING lipid profile and hepatic the same day as echo stress test   Testing/Procedures: Your physician has requested that you have a stress echocardiogram. For further information please visit [email or web address]. Please follow instruction sheet as given.    Follow-Up: Your physician recommends that you schedule a follow-up appointment in: 2-3 weeks with Dr.End   Any Other Special Instructions Will Be Listed Below (If Applicable).  Exercise Stress Echocardiogram An exercise stress echocardiogram is a heart (cardiac) test used to check the function of your heart. This test may also be called an exercise stress echocardiography or stress echo. This stress test will check how well your heart muscle and valves are working and determine if your heart muscle is getting enough blood. You will exercise on a treadmill to naturally increase or stress the functioning of your heart.  An echocardiogram uses sound waves (ultrasound) to produce an image of your heart. If your heart does not work normally, it may indicate coronary artery disease with poor coronary blood supply. The coronary arteries are the arteries that bring blood and oxygen to your heart. LET [facility] CARE PROVIDER KNOW ABOUT:  Any allergies you have.  All medicines you are taking, including vitamins, herbs, eye drops, creams, and over-the-counter medicines.  Previous problems you or members of your family have had with the use of anesthetics.  Any blood disorders you have.  Previous surgeries you have had.  Medical conditions you have.  Possibility of pregnancy, if this applies. RISKS AND COMPLICATIONS Generally, this is a safe procedure. However, as with any procedure, complications can  occur. Possible complications can include:  You develop pain or pressure in the following areas:  Chest.  Jaw or neck.  Between your shoulder blades.  Radiating down your left arm.  Dizziness or lightheadedness.  Shortness of breath.  Increased or irregular heartbeat.  Nausea or vomiting.  Heart attack (rare). BEFORE THE PROCEDURE  Avoid all forms of caffeine for 24 hours before your test or as directed by your health care provider. This includes coffee, tea (even decaffeinated tea), caffeinated sodas, chocolate, cocoa, and certain pain medicines.  Follow your health care provider's instructions regarding eating and drinking before the test.  Take your medicines as directed at regular times with water unless instructed otherwise. Exceptions may include:  If you have diabetes, ask how you are to take your insulin or pills. It is common to adjust insulin dosing the morning of the test.  If you are taking beta-blocker medicines, it is important to talk to your health care provider about these medicines well before the date of your test. Taking beta-blocker medicines may interfere with the test. In some cases, these medicines need to be changed or stopped 24 hours or more before the test.  If you wear a nitroglycerin patch, it may need to be removed prior to the test. Ask your health care provider if the patch should be removed before the test.  If you use an inhaler for any breathing condition, bring it with you to the test.  If you are an outpatient, bring a snack so you can eat right after the stress phase of the test.  Do not smoke for 4 hours prior to the test or as directed by your health care provider.  Wear  loose-fitting clothes and comfortable shoes for the test. This test involves walking on a treadmill. PROCEDURE   Multiple electrodes will be put on your chest. If needed, small areas of your chest may be shaved to get better contact with the electrodes. Once the  electrodes are attached to your body, multiple wires will be attached to the electrodes, and your heart rate will be monitored.  You will have an echocardiogram done at rest.  To produce this image of your heart, gel is applied to your chest, and a wand-like tool (transducer) is moved over the chest. The transducer sends the sound waves through the chest to create the moving images of your heart.  You may need an IV to receive a medication that improves the quality of the pictures.  You will then walk on a treadmill. The treadmill will be started at a slow pace. The treadmill speed and incline will gradually be increased to raise your heart rate.  At the peak of exercise, the treadmill will be stopped. You will lie down immediately on a bed so that a second echocardiogram can be done to visualize your heart's motion with exercise.  The test usually takes 30-60 minutes to complete. AFTER THE PROCEDURE  Your heart rate and blood pressure will be monitored after the test.  You may return to your normal schedule, including diet, activities, and medicines, unless your health care provider tells you otherwise.   This information is not intended to replace advice given to you by your health care provider. Make sure you discuss any questions you have with your health care provider.   Document Released: 01/10/2004 Document Revised: 01/10/2013 Document Reviewed: 09/12/2012 Elsevier Interactive Patient Education [employer].     If you need a refill on your cardiac medications before your next appointment, please call your pharmacy.

## 2015-12-03 IMAGING — CT CT RENAL STONE PROTOCOL
2 of 4 series · 5 of 46 positions shown, 7 images · non-contrast
Comparison: 12/27/2012

CLINICAL DATA: RIGHT flank pain for 3 days worsening this morning,
history kidney stones, type 1 diabetes

EXAM:
CT ABDOMEN AND PELVIS WITHOUT CONTRAST
TECHNIQUE: Multidetector CT imaging of the abdomen and pelvis was performed
following the standard protocol without IV contrast. Sagittal and
coronal MPR images reconstructed from axial data set.

[Series 204: cor · coronal · 0.50mm/px · 4 of 111 slices shown, 5 images]
[im 25/111  soft-tissue]
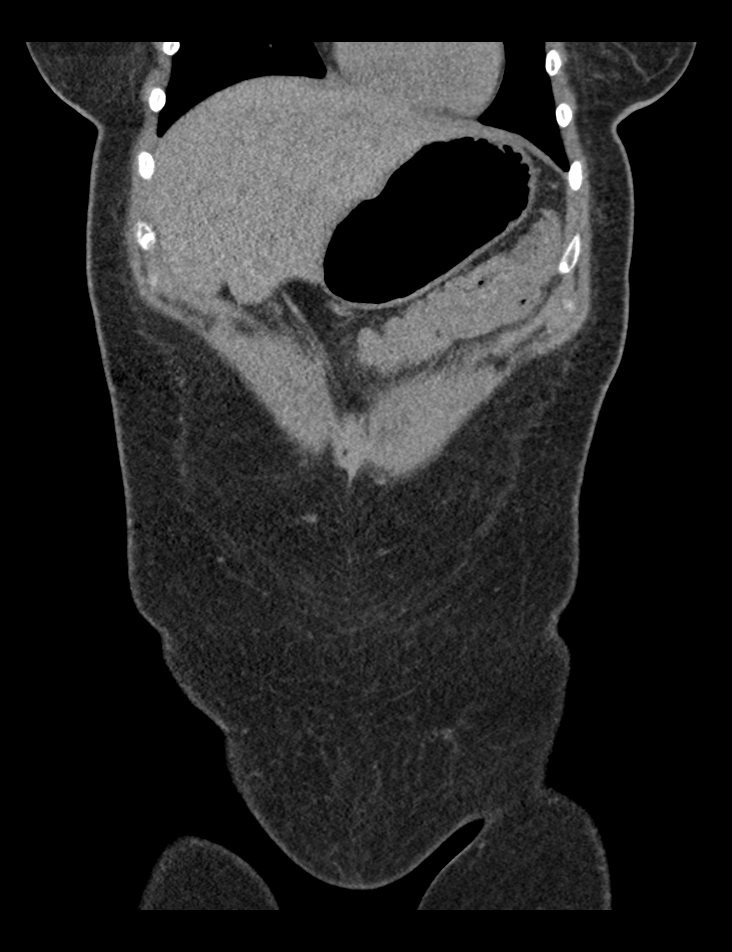
[im 25/111  bone]
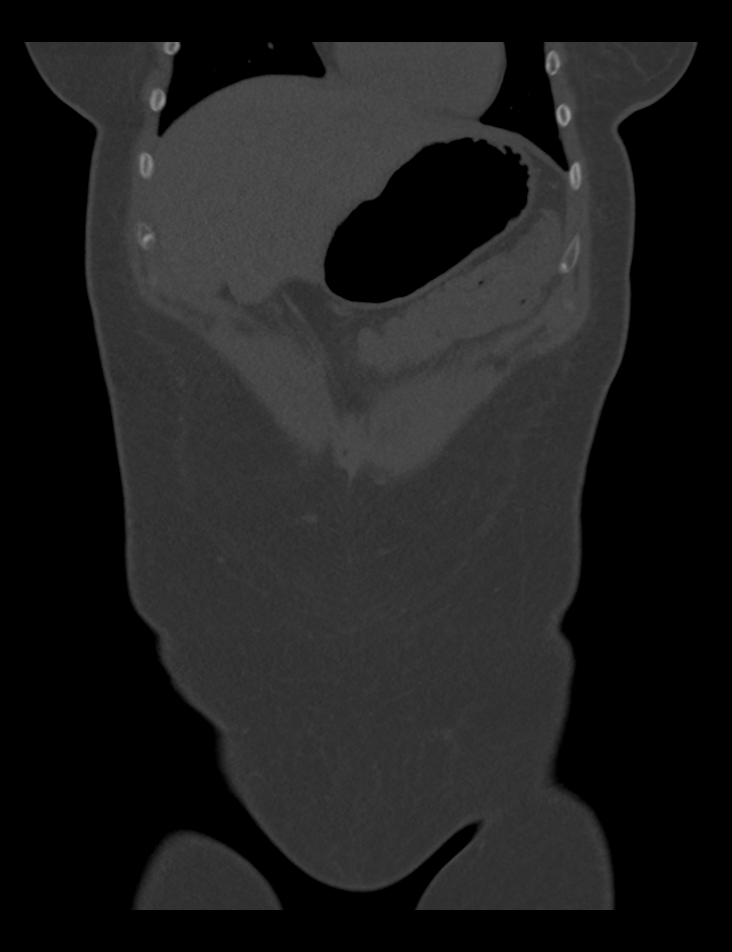
[im 49/111  soft-tissue]
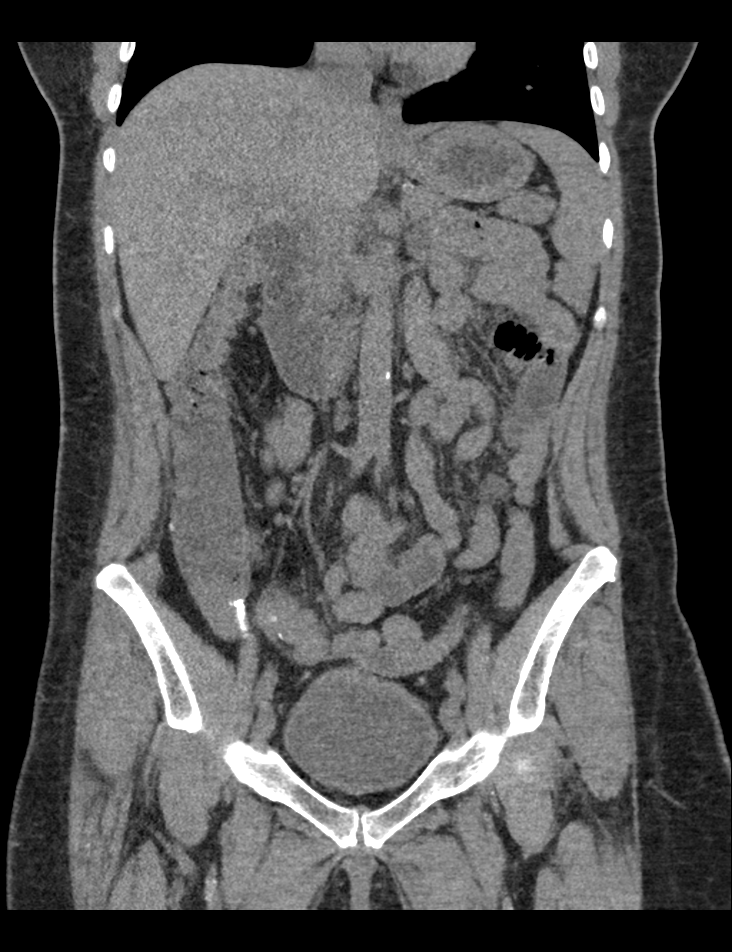
[im 74/111  soft-tissue]
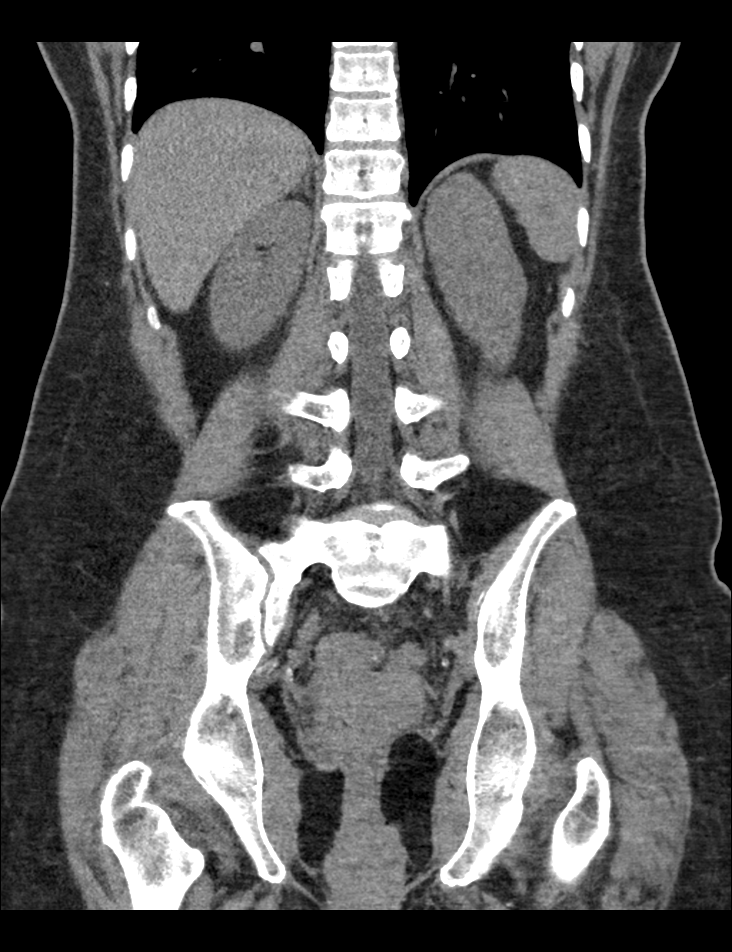
[im 98/111  soft-tissue]
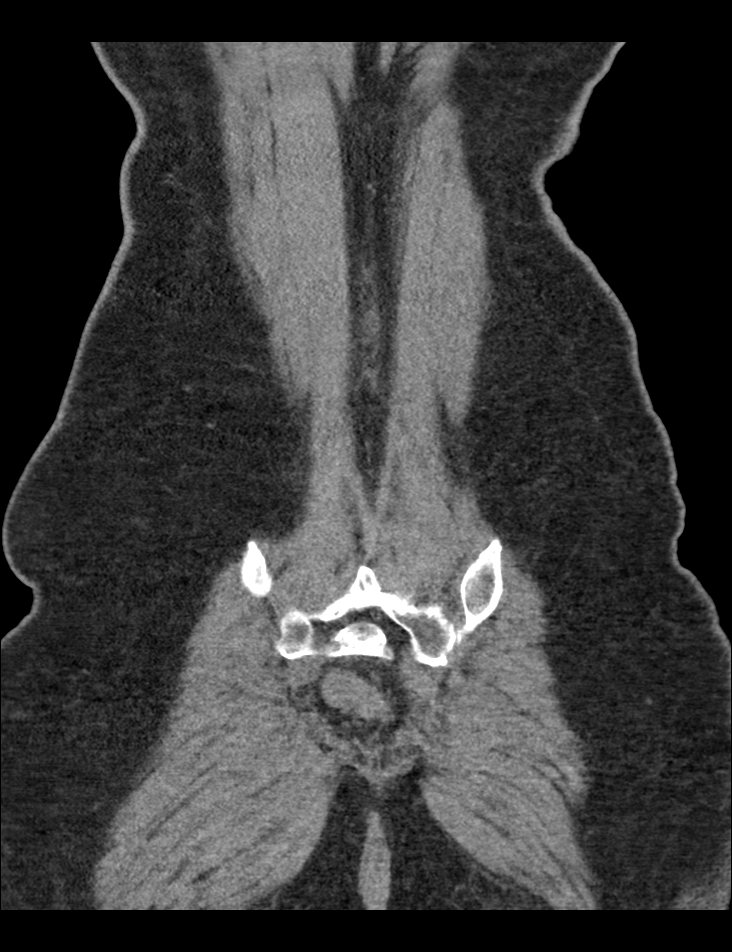

[Series 205: sag · sagittal · 0.50mm/px · 1 of 141 slices shown, 2 images]
[im 47/141  soft-tissue]
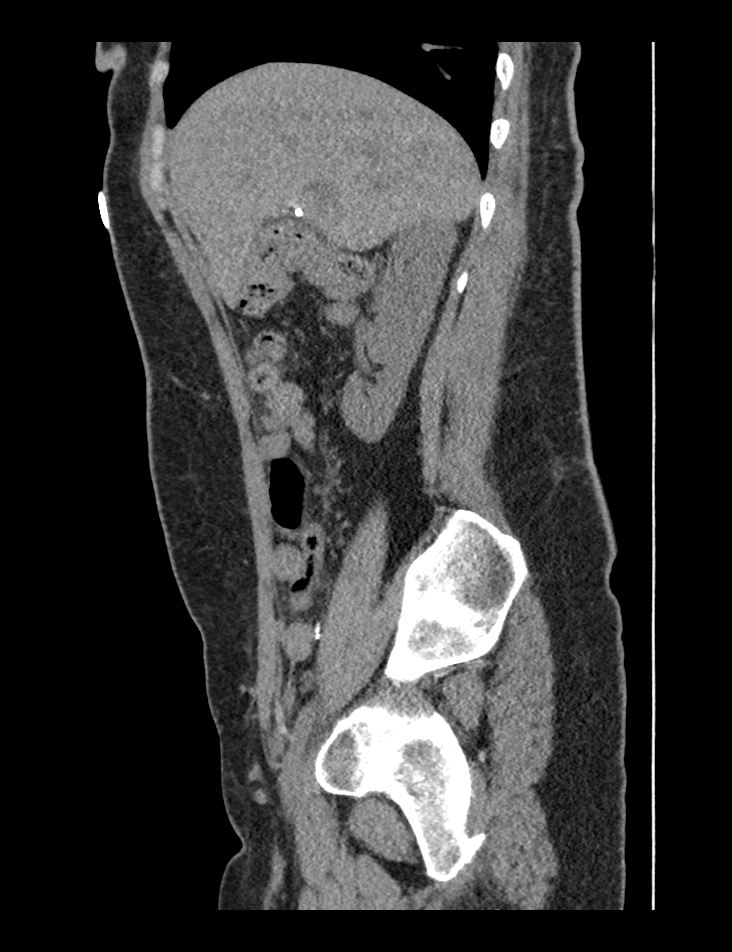
[im 47/141  bone]
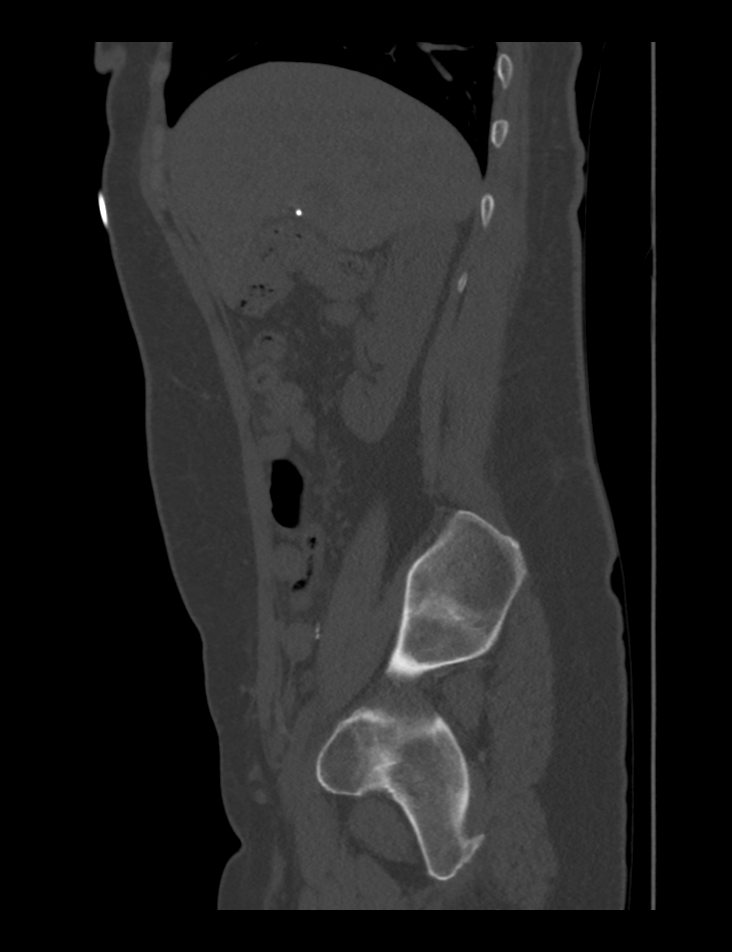

[5 of 46 positions shown; findings below may reference images not displayed]

FINDINGS: Lung bases clear.

Post cholecystectomy.

Kidneys malrotated, hila directed anteriorly.

No definite hydronephrosis, ureteral calcification or dilatation.

Nonobstructing calculus LEFT kidney image 24 unchanged.

Within limits of a nonenhanced exam no other focal abnormalities of
the liver, spleen, pancreas, kidneys or adrenal glands.

Appendix surgically absent.

Soft tissue superior to the vagina likely represents residual
cervix, patient by history post supracervical hysterectomy.

Neither ovary visualized.

Unremarkable bladder and ureters.

Stomach and bowel unremarkable for exam lacking contrast and
adequate bowel distention.

Postsurgical changes at the anterior pelvic wall.

No mass, adenopathy, free fluid or inflammatory process.

Osseous structures unremarkable.
IMPRESSION: No definite acute intra-abdominal or intrapelvic abnormalities
identified.

Nonobstructing LEFT renal calculus.

## 2015-12-10 ENCOUNTER — Other Ambulatory Visit: Payer: Self-pay | Admitting: Physician Assistant

## 2015-12-11 DIAGNOSIS — M47817 Spondylosis without myelopathy or radiculopathy, lumbosacral region: Secondary | ICD-10-CM | POA: Diagnosis not present

## 2015-12-11 DIAGNOSIS — G894 Chronic pain syndrome: Secondary | ICD-10-CM | POA: Diagnosis not present

## 2015-12-11 DIAGNOSIS — B0229 Other postherpetic nervous system involvement: Secondary | ICD-10-CM | POA: Diagnosis not present

## 2015-12-11 DIAGNOSIS — E1142 Type 2 diabetes mellitus with diabetic polyneuropathy: Secondary | ICD-10-CM | POA: Diagnosis not present

## 2015-12-16 ENCOUNTER — Ambulatory Visit (INDEPENDENT_AMBULATORY_CARE_PROVIDER_SITE_OTHER): Payer: BLUE CROSS/BLUE SHIELD | Admitting: Orthopedic Surgery

## 2015-12-16 ENCOUNTER — Encounter (INDEPENDENT_AMBULATORY_CARE_PROVIDER_SITE_OTHER): Payer: Self-pay | Admitting: Orthopedic Surgery

## 2015-12-16 ENCOUNTER — Ambulatory Visit (INDEPENDENT_AMBULATORY_CARE_PROVIDER_SITE_OTHER): Payer: Medicare Other

## 2015-12-16 VITALS — Ht 64.0 in | Wt 176.0 lb

## 2015-12-16 DIAGNOSIS — S93325S Dislocation of tarsometatarsal joint of left foot, sequela: Secondary | ICD-10-CM

## 2015-12-16 DIAGNOSIS — M6702 Short Achilles tendon (acquired), left ankle: Secondary | ICD-10-CM | POA: Diagnosis not present

## 2015-12-16 NOTE — Progress Notes (Signed)
Office Visit Note   Patient: Maureen [name]           Date of Birth: May 04, 1970           MRN: [id number] Visit Date: 12/16/2015              Requested by: [name], MD [address] [place], [place] [address] PCP: [name], MD   Assessment & Plan: Visit Diagnoses:  1. Lisfranc dislocation, left, sequela   2. Short Achilles tendon (acquired), left ankle     Plan: Patient complains of pain with activities of daily living she states she cannot do the activities that she wants to do due to her left foot pain she states she has failed conservative care. We'll plan for open reduction internal fixation for the Lisfranc fracture plan for gastrocnemius recession. Discussed that she is at increased risk of infection neurovascular injury nonhealing the wound need for additional surgery due to her diabetes. Recommended exercise diet control patient states she understands wish to proceed at this time.  Follow-Up Instructions: Return in about 2 weeks (around 12/30/2015).   Orders:  Orders Placed This Encounter  Procedures  . XR Foot Complete Left   No orders of the defined types were placed in this encounter.     Procedures: No procedures performed   Clinical Data: No additional findings.   Subjective: Chief Complaint  Patient presents with  . Left Foot - Follow-up    Patient is returns as a 4 week follow up on lisfranc fracture at base of 2nd Metatarsal. States still hurts and increase pain with the weather. Went to her pain management Doctor and they increased her pain meds due to the weather causing the pain to worsen. Swelling, but did a lot of standing over thanksgiving weekend. States it dont usually swell.     Review of Systems   Objective: Vital Signs: Ht 5\' 4"  (1.626 m)   Wt 176 lb (79.8 kg)   BMI 30.21 kg/m   Physical Exam On examination patient is alert oriented no adenopathy well-dressed normal affect normal respiratory effort she does have  an antalgic gait. She has a good dorsalis pedis and posterior tibial pulse. She has exquisite a heel cord contracture with dorsiflexion about 20 short of neutral with her knee extended. She is tender to palpation across the Lisfranc fracture and tender to palpation across the metatarsal heads due to heel cord contracture. There is no redness no cellulitis she does have some swelling across the Lisfranc joint. Ortho Exam  Specialty Comments:  No specialty comments available.  Imaging: Xr Foot Complete Left  Result Date: 12/16/2015 Three-view radiographs the left foot shows a fibrous union across the base of the second metatarsal fracture there is no malalignment she does have calcification of the vessels around the ankle and down to the metatarsals.    PMFS History: Patient Active Problem List   Diagnosis Date Noted  . Lisfranc dislocation, left, sequela 12/16/2015  . Short Achilles tendon (acquired), left ankle 12/16/2015  . Chest pain with moderate risk for cardiac etiology 11/29/2015  . Elevated troponin 11/19/2015  . Migraine headache 11/19/2015  . Neck pain 11/19/2015  . Nausea   . Acute kidney injury ([place]) 02/19/2015  . Left flank pain 02/19/2015  . DKA (diabetic ketoacidoses) ([place]) 10/24/2014  . HLD (hyperlipidemia) 10/24/2014  . Hx pulmonary embolism 09/28/2014  . Nausea vomiting and diarrhea 09/03/2014  . Abdominal pain 09/03/2014  . Abnormal LFTs 09/03/2014  . Dizziness 09/03/2014  .  Acute pulmonary embolism ([place]) 09/03/2014  . Transaminitis 09/02/2014  . GAD (generalized anxiety disorder) 11/23/2013  . PTSD (post-traumatic stress disorder) 11/23/2013  . Severe major depression without psychotic features ([place]) 10/10/2013  . Major depression ([place]) 05/22/2013  . Chest pain 05/18/2012  . Endometriosis 12/08/2011  . Arthritis of left knee 12/08/2011  . Diabetic retinopathy ([place]) 12/08/2011  . Fibromyalgia 10/23/2011  . DM type 1 (diabetes mellitus, type 1) ([place])  10/13/2011   Past Medical History:  Diagnosis Date  . Abdominal pain   . Anxiety   . Arthritis   . Biliary dyskinesia   . Cervical strain   . Closed head injury 2010  . Depression   . Fibromyalgia   . Hyperlipidemia   . Hypoglycemia   . Joint pain   . Major depressive disorder   . Migraine    hx of none recent  . Nausea & vomiting   . Post traumatic stress disorder (PTSD)   . Pulmonary embolism ([place])   . Renal stones   . Skin rash    due to medications  . Sleep trouble   . Stroke ([place])   . TIA (transient ischemic attack)   . Well controlled type 1 diabetes mellitus with peripheral neuropathy ([place])     Family History  Problem Relation Age of Onset  . Cancer Father     lymphoma  . Nephrolithiasis Father   . Vascular Disease Father   . Alcohol abuse Father   . Drug abuse Father   . Cancer Maternal Grandmother     colon  . Hypertension Mother   . Heart disease Mother   . Cataracts Mother   . Depression Mother   . Diabetes Brother   . Drug abuse Brother   . Mental illness Maternal Grandfather   . Cancer Maternal Grandfather   . Heart disease Paternal Grandmother   . Heart disease Paternal Grandfather   . Suicidality Maternal Uncle     Past Surgical History:  Procedure Laterality Date  . ABDOMINAL HYSTERECTOMY  2001  . APPENDECTOMY    . [place]  . CHOLECYSTECTOMY  02/10/2011   Procedure: LAPAROSCOPIC CHOLECYSTECTOMY WITH INTRAOPERATIVE CHOLANGIOGRAM;  Surgeon: Judieth Keens, DO;  Location: [facility];  Service: General;  Laterality: N/A;  . exploratory laparomty  Mar 04, 2012  . EYE SURGERY  2006   laser eye surgery left eye  . KNEE SURGERY Left 2012  . LAPAROSCOPIC APPENDECTOMY N/A 10/12/2012   Procedure: DIAGNOSTIC APPENDECTOMY LAPAROSCOPIC;  Surgeon: Madilyn Hook, DO;  Location: [facility];  Service: General;  Laterality: N/A;  . lipoma removed  yrs ago  . ROBOTIC ASSISTED LAPAROSCOPIC LYSIS OF ADHESION N/A 03/04/2012   Procedure: ROBOTIC  ASSISTED LAPAROSCOPIC LYSIS OF EXTENSIVE ADHESIONS;  Surgeon: Claiborne Billings A. Pamala Hurry, MD;  Location: [facility];  Service: Gynecology;  Laterality: N/A;  . 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 Smoking status: Never Smoker  . Smokeless tobacco: Never Used  . Alcohol use No  . Drug use: No  . Sexual activity: Not on file

## 2015-12-17 ENCOUNTER — Other Ambulatory Visit (INDEPENDENT_AMBULATORY_CARE_PROVIDER_SITE_OTHER): Payer: Self-pay | Admitting: Family

## 2015-12-19 ENCOUNTER — Telehealth (HOSPITAL_COMMUNITY): Payer: Self-pay | Admitting: *Deleted

## 2015-12-19 NOTE — Telephone Encounter (Signed)
Left message on voicemail per DPR in reference to upcoming appointment scheduled on 01/04/16 at 7:30 with detailed instructions given per Stress Test Requisition Sheet for the test. LM to arrive 30 minutes early, and that it is imperative to arrive on time for appointment to keep from having the test rescheduled. If you need to cancel or reschedule your appointment, please call the office within 24 hours of your appointment. Failure to do so may result in a cancellation of your appointment, and a $50 no show fee. Phone number given for call back for any questions. Veronia Beets

## 2015-12-25 ENCOUNTER — Ambulatory Visit (HOSPITAL_COMMUNITY): Payer: BLUE CROSS/BLUE SHIELD | Attending: Cardiology

## 2015-12-25 ENCOUNTER — Ambulatory Visit (HOSPITAL_BASED_OUTPATIENT_CLINIC_OR_DEPARTMENT_OTHER): Payer: BLUE CROSS/BLUE SHIELD

## 2015-12-25 ENCOUNTER — Other Ambulatory Visit (INDEPENDENT_AMBULATORY_CARE_PROVIDER_SITE_OTHER): Payer: BLUE CROSS/BLUE SHIELD

## 2015-12-25 DIAGNOSIS — R079 Chest pain, unspecified: Secondary | ICD-10-CM

## 2015-12-25 DIAGNOSIS — E785 Hyperlipidemia, unspecified: Secondary | ICD-10-CM

## 2015-12-25 LAB — LIPID PANEL
Cholesterol: 131 mg/dL (ref <200)
HDL: 57 mg/dL (ref >50)
LDL Cholesterol: 54 mg/dL (ref <100)
Total CHOL/HDL Ratio: 2.3 Ratio (ref <5.0)
Triglycerides: 98 mg/dL (ref <150)
VLDL: 20 mg/dL (ref <30)

## 2015-12-25 LAB — HEPATIC FUNCTION PANEL
ALT: 21 U/L (ref 6–29)
AST: 26 U/L (ref 10–30)
Albumin: 3.4 g/dL — ABNORMAL LOW (ref 3.6–5.1)
Alkaline Phosphatase: 104 U/L (ref 33–115)
Bilirubin, Direct: 0.1 mg/dL (ref <=0.2)
Indirect Bilirubin: 0.2 mg/dL (ref 0.2–1.2)
Total Bilirubin: 0.3 mg/dL (ref 0.2–1.2)
Total Protein: 6.4 g/dL (ref 6.1–8.1)

## 2015-12-29 NOTE — Progress Notes (Signed)
Follow-up Outpatient Visit Date: 12/30/2015  Chief Complaint: Follow-up chest pain  HPI:  Ms. Delmonaco is a 45 y.o. year-old female with history of type 1 diabetes mellitus since age 69, hyperlipidemia, TIA, and pulmonary embolism, who presents for follow-up of chest pain.  She was seen in our office by Lyda Jester on 11/29/15 after an ED visit for headache during which she also noted chest pain.  Initial troponin was minimally elevated at 0.07, though subsequent measurements were negative at 0.03 x 2.  CTA of the chest was negative for PE.  She continued to have atypical chest pain following the ED visit and was referred for stress echo, which was normal.  Today, the patient is without chest pain. She has continued to have occasional episodes occurring approximately once per week. The pain is sharp and substernal, as if having been "kicked in the chest." The pain typically lasts for a few hours and is not clearly exertional related to a particular activity. She notes accompanying nausea but frequently feels nauseated without chest pain. She also has some exertional dyspnea that she attributes to having gained weight over the last several months due to decreased mobility from a left foot fracture. She has PND as well as chronic orthopnea. She reports that she snores frequently. She gets palpitations when nervous but denies lightheadedness. She drinks 3 cans of caffeinated soda per day. The patient notes that her blood sugars have been suboptimally controlled over the last several months, which she attributes to her inactivity and weight gain.  ------------------------------------------------------------------------------------------------  Cardiovascular History & Procedures: Cardiovascular Problems:  Atypical chest pain  TIA  Pulmonary embolism  Risk Factors:  Diabetes mellitus, hyperlipidemia, TIA, and family history of premature cardiovascular disease  Cath/PCI:  None  CV  Surgery:  None  EP Procedures and Devices:  None  Non-Invasive Evaluation(s):  Exercise stress echocardiogram (12/25/15): Good exercise capacity (10:00 min; 11.7 METS) with peak heart rate of 179 bpm.  No significant ST segment changes or arrhythmias were identified.  Baseline echo showed normal LV function.  Stress images showed normal hyperdynamic LV contraction without wall motion abnormality.  Recent CV Pertinent Labs: Lab Results  Component Value Date   CHOL 131 12/25/2015   HDL 57 12/25/2015   LDLCALC 54 12/25/2015   TRIG 98 12/25/2015   CHOLHDL 2.3 12/25/2015   INR 0.92 11/19/2015   BNP 10.8 06/26/2015   K 4.6 11/20/2015   MG 2.1 05/18/2012   BUN 11 11/20/2015   CREATININE 0.99 11/20/2015   CREATININE 1.01 02/23/2015    Past medical and surgical history were reviewed and updated in EPIC.   Outpatient Encounter Prescriptions as of 12/30/2015  Medication Sig  . acetaminophen (TYLENOL) 500 MG tablet Take 1 tablet (500 mg total) by mouth every 8 (eight) hours as needed for headache.  Marland Kitchen aspirin EC 325 MG tablet Take 325 mg by mouth daily.  Marland Kitchen atorvastatin (LIPITOR) 40 MG tablet Take 40 mg by mouth daily.  Marland Kitchen gabapentin (NEURONTIN) 300 MG capsule Take 300 mg by mouth at bedtime.  . insulin aspart (NOVOLOG) 100 UNIT/ML injection Inject 8 Units into the skin 3 (three) times daily with meals.  Marland Kitchen LANTUS 100 UNIT/ML injection Inject 26 Units into the skin at bedtime.  . lidocaine (LIDODERM) 5 % Place 1 patch onto the skin daily as needed (pain). Remove after 12 hours.  . Oxycodone HCl 10 MG TABS Take 10 mg by mouth every 6 (six) hours as needed for pain.  . [DISCONTINUED]  gabapentin (NEURONTIN) 300 MG capsule Take 1 capsule (300 mg total) by mouth at bedtime. (Patient not taking: Reported on 12/30/2015)  . [DISCONTINUED] insulin glargine (LANTUS) 100 UNIT/ML injection Inject 0.26 mLs (26 Units total) into the skin at bedtime. (Patient not taking: Reported on 12/30/2015)   No  facility-administered encounter medications on file as of 12/30/2015.     Allergies: Cephalexin; Ibuprofen; and Meloxicam  Social History   Social History  . Marital status: Divorced    Spouse name: Roshandra Monley  . Number of children: 2  . Years of education: 12+   Occupational History  . 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 Smoking status: Never Smoker  . Smokeless tobacco: Never Used  . Alcohol use No  . Drug use: No  . Sexual activity: Not on file   Other Topics Concern  . Not on file   Social History Narrative   Lives with husband and two sons.  Before going back to school, she was a Research scientist (physical sciences) at [employer].  Stop working when she was diagnosed with endometriosis and had a TIA.    Family History  Problem Relation Age of Onset  . Cancer Father     lymphoma  . Nephrolithiasis Father   . Vascular Disease Father   . Alcohol abuse Father   . Drug abuse Father   . Cancer Maternal Grandmother     colon  . Hypertension Mother   . Heart disease Mother   . Cataracts Mother   . Depression Mother   . Diabetes Brother   . Drug abuse Brother   . Mental illness Maternal Grandfather   . Cancer Maternal Grandfather   . Heart disease Paternal Grandmother   . Heart disease Paternal Grandfather   . Suicidality Maternal Uncle     Review of Systems: Review of Systems  Constitutional: Negative.   HENT: Negative.   Eyes: Negative.   Respiratory: Positive for shortness of breath (with activity).   Cardiovascular: Positive for chest pain, orthopnea and PND.  Gastrointestinal: Positive for abdominal pain (chronic due to "scar tissue") and nausea.  Genitourinary: Negative.   Musculoskeletal: Negative.   Skin: Negative.   Neurological: Positive for headaches.  Endo/Heme/Allergies: Negative.   Psychiatric/Behavioral: Positive for depression.    --------------------------------------------------------------------------------------------------  Physical Exam: BP 122/64 (BP Location: Left Arm)   Pulse 84   Ht 5\' 4"  (1.626 m)   Wt 180 lb 12.8 oz (82 kg)   BMI 31.03 kg/m   General:  Overweight woman, seated comfortably in the exam room. HEENT: No conjunctival pallor or scleral icterus.  Moist mucous membranes.  OP clear. Neck: Supple without lymphadenopathy, thyromegaly, JVD, or HJR.  No carotid bruit. Lungs: Normal work of breathing.  Clear to auscultation bilaterally without wheezes or crackles. Heart: Regular rate and rhythm without murmurs, rubs, or gallops.  Non-displaced PMI. Abd: Bowel sounds present.  Soft, NT/ND without hepatosplenomegaly Ext: Trace right lower extremity edema. No left lower extremity swelling.  Radial, PT, and DP pulses are 2+ bilaterally. Skin: warm and dry without rash  CTA chest (11/19/15): I have personally reviewed the images.  Heart size is normal without significant coronary artery calcification.  There is no pulmonary embolism.  A 2 mm subpleural nodules is noted in the right lung.  Lab Results  Component Value Date   WBC 11.1 (H) 11/20/2015   HGB 12.2 11/20/2015   HCT 36.7 11/20/2015   MCV 93.4 11/20/2015  PLT 393 11/20/2015    Lab Results  Component Value Date   NA 136 11/20/2015   K 4.6 11/20/2015   CL 104 11/20/2015   CO2 25 11/20/2015   BUN 11 11/20/2015   CREATININE 0.99 11/20/2015   GLUCOSE 333 (H) 11/20/2015   ALT 21 12/25/2015    Lab Results  Component Value Date   CHOL 131 12/25/2015   HDL 57 12/25/2015   LDLCALC 54 12/25/2015   TRIG 98 12/25/2015   CHOLHDL 2.3 12/25/2015   --------------------------------------------------------------------------------------------------  ASSESSMENT AND PLAN: Atypical chest pain: Patient can use have episodes of intermittent sharp pain that is present both at rest and with exertion. Recent exercise treadmill stress test was  reassuring; the patient also did not have chest pain during the study. Recent CT of the chest was without significant coronary artery calcification. At this time, I do not believe that her chest pain is due to significant coronary insufficiency. We have agreed to start famotidine 20 mg twice a day for possible GERD. I suspect her chronic nausea may also reflect a degree of gastroparesis. We will continue her current regimen of aspirin and high intensity statin given her multiple cardiovascular risk factors. Recent LDL was quite good at 54.  Shortness of breath, orthopnea, PND, and snoring: Recent stress echo as well as transthoracic echo last year were without evidence of heart failure. Question if the patient has underlying sleep apnea. We will refer her for a sleep study.  Type 1 diabetes mellitus: Hemoglobin A1c has been increasing, per patient's report. She currently does not have a PCP. We will refer her to a new provider to assist with management of her chronic medical conditions, including diabetes.  Left foot pain: Patient reports fracture of the left foot this sprain. She has continued to have chronic pain and is planning surgical intervention next week. No further cardiovascular testing or intervention is needed at this time, as the patient is low risk for perioperative cardiovascular complications.  Follow-up: Return to clinic in 6 months.  Nelva Bush, MD 12/30/2015 8:56 AM

## 2015-12-30 ENCOUNTER — Ambulatory Visit (INDEPENDENT_AMBULATORY_CARE_PROVIDER_SITE_OTHER): Payer: BLUE CROSS/BLUE SHIELD | Admitting: Internal Medicine

## 2015-12-30 ENCOUNTER — Encounter: Payer: Self-pay | Admitting: Internal Medicine

## 2015-12-30 VITALS — BP 122/64 | HR 84 | Ht 64.0 in | Wt 180.8 lb

## 2015-12-30 DIAGNOSIS — R0602 Shortness of breath: Secondary | ICD-10-CM

## 2015-12-30 DIAGNOSIS — E1065 Type 1 diabetes mellitus with hyperglycemia: Secondary | ICD-10-CM | POA: Diagnosis not present

## 2015-12-30 DIAGNOSIS — R0789 Other chest pain: Secondary | ICD-10-CM | POA: Diagnosis not present

## 2015-12-30 DIAGNOSIS — R0683 Snoring: Secondary | ICD-10-CM

## 2015-12-30 DIAGNOSIS — M79672 Pain in left foot: Secondary | ICD-10-CM

## 2015-12-30 MED ORDER — FAMOTIDINE 20 MG PO TABS: 20.0000 mg | ORAL_TABLET | Freq: Two times a day (BID) | ORAL | 6 refills | Status: DC

## 2015-12-30 NOTE — Patient Instructions (Signed)
Medication Instructions:  Start Pepcid 20mg  two times a day  Labwork: None  Testing/Procedures: Your physician has recommended that you have a sleep study. This test records several body functions during sleep, including: brain activity, eye movement, oxygen and carbon dioxide blood levels, heart rate and rhythm, breathing rate and rhythm, the flow of air through your mouth and nose, snoring, body muscle movements, and chest and belly movement.    Follow-Up: Your physician wants you to follow-up in: 6 months with Dr End. (June 2018). You will receive a reminder letter in the mail two months in advance. If you don't receive a letter, please call our office to schedule the follow-up appointment.   You have been referred to Primary Care to establish with a primary care doctor.      If you need a refill on your cardiac medications before your next appointment, please call your pharmacy.

## 2016-01-01 ENCOUNTER — Encounter (HOSPITAL_COMMUNITY): Payer: Self-pay

## 2016-01-01 ENCOUNTER — Encounter (HOSPITAL_COMMUNITY)
Admission: RE | Admit: 2016-01-01 | Discharge: 2016-01-01 | Disposition: A | Discharge status: Home / Self Care | Payer: BLUE CROSS/BLUE SHIELD | Source: Ambulatory Visit | Attending: Orthopedic Surgery | Admitting: Orthopedic Surgery

## 2016-01-01 DIAGNOSIS — Z01812 Encounter for preprocedural laboratory examination: Secondary | ICD-10-CM | POA: Insufficient documentation

## 2016-01-01 DIAGNOSIS — Z886 Allergy status to analgesic agent status: Secondary | ICD-10-CM | POA: Diagnosis not present

## 2016-01-01 DIAGNOSIS — K219 Gastro-esophageal reflux disease without esophagitis: Secondary | ICD-10-CM | POA: Diagnosis not present

## 2016-01-01 DIAGNOSIS — S92812A Other fracture of left foot, initial encounter for closed fracture: Secondary | ICD-10-CM

## 2016-01-01 DIAGNOSIS — S93326A Dislocation of tarsometatarsal joint of unspecified foot, initial encounter: Secondary | ICD-10-CM | POA: Diagnosis not present

## 2016-01-01 DIAGNOSIS — Z9889 Other specified postprocedural states: Secondary | ICD-10-CM | POA: Diagnosis not present

## 2016-01-01 DIAGNOSIS — M6702 Short Achilles tendon (acquired), left ankle: Secondary | ICD-10-CM | POA: Diagnosis present

## 2016-01-01 DIAGNOSIS — E1042 Type 1 diabetes mellitus with diabetic polyneuropathy: Secondary | ICD-10-CM | POA: Diagnosis not present

## 2016-01-01 DIAGNOSIS — F329 Major depressive disorder, single episode, unspecified: Secondary | ICD-10-CM | POA: Diagnosis not present

## 2016-01-01 DIAGNOSIS — Z87442 Personal history of urinary calculi: Secondary | ICD-10-CM | POA: Diagnosis not present

## 2016-01-01 DIAGNOSIS — Z9049 Acquired absence of other specified parts of digestive tract: Secondary | ICD-10-CM | POA: Diagnosis not present

## 2016-01-01 DIAGNOSIS — Z841 Family history of disorders of kidney and ureter: Secondary | ICD-10-CM | POA: Diagnosis not present

## 2016-01-01 DIAGNOSIS — Z8249 Family history of ischemic heart disease and other diseases of the circulatory system: Secondary | ICD-10-CM | POA: Diagnosis not present

## 2016-01-01 DIAGNOSIS — E109 Type 1 diabetes mellitus without complications: Secondary | ICD-10-CM | POA: Insufficient documentation

## 2016-01-01 DIAGNOSIS — X58XXXA Exposure to other specified factors, initial encounter: Secondary | ICD-10-CM | POA: Insufficient documentation

## 2016-01-01 DIAGNOSIS — M7752 Other enthesopathy of left foot: Secondary | ICD-10-CM | POA: Diagnosis not present

## 2016-01-01 DIAGNOSIS — Z79899 Other long term (current) drug therapy: Secondary | ICD-10-CM | POA: Diagnosis not present

## 2016-01-01 DIAGNOSIS — E785 Hyperlipidemia, unspecified: Secondary | ICD-10-CM | POA: Insufficient documentation

## 2016-01-01 DIAGNOSIS — Z794 Long term (current) use of insulin: Secondary | ICD-10-CM | POA: Insufficient documentation

## 2016-01-01 DIAGNOSIS — Z86711 Personal history of pulmonary embolism: Secondary | ICD-10-CM | POA: Diagnosis not present

## 2016-01-01 DIAGNOSIS — M797 Fibromyalgia: Secondary | ICD-10-CM | POA: Diagnosis not present

## 2016-01-01 DIAGNOSIS — Z7982 Long term (current) use of aspirin: Secondary | ICD-10-CM | POA: Diagnosis not present

## 2016-01-01 DIAGNOSIS — Z881 Allergy status to other antibiotic agents status: Secondary | ICD-10-CM | POA: Diagnosis not present

## 2016-01-01 DIAGNOSIS — Z807 Family history of other malignant neoplasms of lymphoid, hematopoietic and related tissues: Secondary | ICD-10-CM | POA: Diagnosis not present

## 2016-01-01 DIAGNOSIS — Z8673 Personal history of transient ischemic attack (TIA), and cerebral infarction without residual deficits: Secondary | ICD-10-CM | POA: Diagnosis not present

## 2016-01-01 DIAGNOSIS — Z90710 Acquired absence of both cervix and uterus: Secondary | ICD-10-CM | POA: Diagnosis not present

## 2016-01-01 DIAGNOSIS — F4310 Post-traumatic stress disorder, unspecified: Secondary | ICD-10-CM | POA: Diagnosis not present

## 2016-01-01 DIAGNOSIS — Z800 Family history of malignant neoplasm of digestive organs: Secondary | ICD-10-CM | POA: Diagnosis not present

## 2016-01-01 HISTORY — DX: Other specified abnormal findings of blood chemistry: R79.89

## 2016-01-01 HISTORY — DX: Personal history of urinary calculi: Z87.442

## 2016-01-01 HISTORY — DX: Gastro-esophageal reflux disease without esophagitis: K21.9

## 2016-01-01 HISTORY — DX: Abnormal results of liver function studies: R94.5

## 2016-01-01 HISTORY — DX: Short Achilles tendon (acquired), left ankle: M67.02

## 2016-01-01 HISTORY — DX: Presence of spectacles and contact lenses: Z97.3

## 2016-01-01 HISTORY — DX: Pneumonia, unspecified organism: J18.9

## 2016-01-01 LAB — CBC
HCT: 47.9 % — ABNORMAL HIGH (ref 36.0–46.0)
Hemoglobin: 16.4 g/dL — ABNORMAL HIGH (ref 12.0–15.0)
MCH: 32.1 pg (ref 26.0–34.0)
MCHC: 34.2 g/dL (ref 30.0–36.0)
MCV: 93.7 fL (ref 78.0–100.0)
Platelets: 377 10*3/uL (ref 150–400)
RBC: 5.11 MIL/uL (ref 3.87–5.11)
RDW: 12.9 % (ref 11.5–15.5)
WBC: 10.9 10*3/uL — ABNORMAL HIGH (ref 4.0–10.5)

## 2016-01-01 LAB — BASIC METABOLIC PANEL
Anion gap: 10 (ref 5–15)
BUN: 6 mg/dL (ref 6–20)
CO2: 26 mmol/L (ref 22–32)
Calcium: 9.8 mg/dL (ref 8.9–10.3)
Chloride: 102 mmol/L (ref 101–111)
Creatinine, Ser: 0.85 mg/dL (ref 0.44–1.00)
GFR calc Af Amer: >60 mL/min (ref >60)
GFR calc non Af Amer: >60 mL/min (ref >60)
Glucose, Bld: 183 mg/dL — ABNORMAL HIGH (ref 65–99)
Potassium: 4 mmol/L (ref 3.5–5.1)
Sodium: 138 mmol/L (ref 135–145)

## 2016-01-01 LAB — GLUCOSE, CAPILLARY: Glucose-Capillary: 179 mg/dL — ABNORMAL HIGH (ref 65–99)

## 2016-01-01 NOTE — Pre-Procedure Instructions (Signed)
XYA GENUNG  01/01/2016      [id number] [place], [place] [id number] [place] [id number] Phone: [phone] Fax: [phone]  [facility] [place], [place] - [address] AT [address] [place] [address] Phone: [phone] Fax: [phone]  [id number] [place], [place] [address] [place] [address] Phone: [phone] Fax: [phone]    Your procedure is scheduled on Friday, January 03, 2016  Report to [facility] Admitting at 10:30 A.M.  Call this number if you have problems the morning of surgery:  [phone]   Remember:  Do not eat food or drink liquids after midnight Thursday, January 02, 2016  Take these medicines the morning of surgery with A SIP OF WATER : famotidine (PEPCID), if needed: pain medication Stop taking vitamins, fish oil and herbal medications. Do not take any NSAIDs ie: Ibuprofen, Advil, Naproxen, BC and Goody Powder; stop now.    How to Manage Your Diabetes Before and After Surgery  Why is it important to control my blood sugar before and after surgery? . Improving blood sugar levels before and after surgery helps healing and can limit problems. . A way of improving blood sugar control is eating a healthy diet by: o  Eating less sugar and carbohydrates o  Increasing activity/exercise o  Talking with your doctor about reaching your blood sugar goals . High blood sugars (greater than 180 mg/dL) can raise your risk of infections and slow your recovery, so you will need to focus on controlling your diabetes during the weeks before surgery. . Make sure that the doctor who takes care of your diabetes knows about your planned surgery including the date and location.  How do I manage my blood sugar before surgery? . Check your blood sugar at  least 4 times a day, starting 2 days before surgery, to make sure that the level is not too high or low. o Check your blood sugar the morning of your surgery when you wake up and every 2 hours until you get to the Short Stay unit. . If your blood sugar is less than 70 mg/dL, you will need to treat for low blood sugar: o Do not take insulin. o Treat a low blood sugar (less than 70 mg/dL) with  cup of clear juice (cranberry or apple), 4 glucose tablets, OR glucose gel. o Recheck blood sugar in 15 minutes after treatment (to make sure it is greater than 70 mg/dL). If your blood sugar is not greater than 70 mg/dL on recheck, call [phone] for further instructions. . Report your blood sugar to the short stay nurse when you get to Short Stay.  . If you are admitted to the hospital after surgery: o Your blood sugar will be checked by the staff and you will probably be given insulin after surgery (instead of oral diabetes medicines) to make sure you have good blood sugar levels. o The goal for blood sugar control after surgery is 80-180 mg/dL.  WHAT DO I DO ABOUT MY DIABETES MEDICATION?   . THE NIGHT BEFORE SURGERY, take 20 units of Lantus Insulin    . If your CBG is greater than 220 mg/dL, you may take  of your sliding scale (correction) dose of insulin.  Patient Signature:  Date:   Nurse  Signature:  Date:   Reviewed and Endorsed by [facility] Patient Education Committee, August 2015  Do not wear jewelry, make-up or nail polish.  Do not wear lotions, powders, or perfumes, or deoderant.  Do not shave 48 hours prior to surgery.   Do not bring valuables to the hospital.  [facility] is not responsible for any belongings or valuables.  Contacts, dentures or bridgework may not be worn into surgery.  Leave your suitcase in the car.  After surgery it may be brought to your room.  For patients admitted to the hospital, discharge time will be determined by your treatment team.  Patients  discharged the day of surgery will not be allowed to drive home.   Special instructions: Shower the night before surgery and the morning of surgery with CHG.  Please read over the following fact sheets that you were given. Pain Booklet, Coughing and Deep Breathing and Surgical Site Infection Prevention

## 2016-01-01 NOTE — Progress Notes (Signed)
Pt denies SOB and chest pain but is under the care of Dr. Saunders Revel, Cardiology. Pt stated that a stress test was performed >10 years ago but denies having a cardiac cath. Pt stated that her fasting blood glucose ranges from 170-180's. Pt chart forwarded to anesthesia to review notes from recent admission, abnormal troponin result, A1c of 10.4 and incidental pulmonary nodule finding.

## 2016-01-02 ENCOUNTER — Telehealth (INDEPENDENT_AMBULATORY_CARE_PROVIDER_SITE_OTHER): Payer: Self-pay | Admitting: Orthopedic Surgery

## 2016-01-02 NOTE — Telephone Encounter (Signed)
I called Maureen [name] and advised of increased risks with uncontrolled blood sugar. She states that she has made some changes in her eating habits and she is hopeful that her numbers will be better in the morning before surgery.  She would like to proceed with surgery as scheduled for tomorrow.

## 2016-01-02 NOTE — Telephone Encounter (Signed)
Call patient and let her know that her hemoglobin A1c is greater than 10, she has increased risk of complications including infection from surgery. We could proceed with surgery at this time, however I would recommend getting her sugars under better control and then proceeding with surgery at a later date.

## 2016-01-02 NOTE — Progress Notes (Signed)
Anesthesia Chart Review: Patient is a 45 year old female scheduled for left ORIF lisfranc joint and gastrocnemius recession on 01/03/16 by Dr. Sharol Given.  History includes DM1, HLD, TIA, RUL PE 09/03/14. She was recently evaluated in the ED for headache and chest pain. She was admitted 11/19/15 overnight for observation. Her first of four troponins was mildly elevated at 0.07. CTA was negative for PE. although a 2 mm subpleural nodule was noted on the right. Patient was referred to cardiology and ultimately had an out-patient negative stress echo.   No PCP is listed, but has had several visits at [facility].  Cardiologist is Dr. Saunders Revel, last visit was 12/30/15. He did not recommend any further CV testing or cardiac intervention prior to surgery and felt she was "low risk for perioperative cardiovascular complications."  Meds include ASA 325 mg, Lipitor, Pepcid, Neurontin, Novolog, Lantus, Lioderm, oxycodone.  BP 137/71   Pulse 88   Temp 36.7 C   Resp 20   Ht 5\' 4"  (1.626 m)   Wt 178 lb 6.4 oz (80.9 kg)   SpO2 100%   BMI 30.62 kg/m   11/29/15 EKG: NSR, rightward axis.  12/25/15 Stress Echo: Impressions: - Negative stress echo.   No evidence of ischemia  09/03/14 Echo: Study Conclusions - Left ventricle: The cavity size was normal. Wall thickness was   normal. The estimated ejection fraction was 60%. Wall motion was   normal; there were no regional wall motion abnormalities. - Right ventricle: The cavity size was normal. Systolic function   was normal.  11/19/15 CTA Chest: IMPRESSION: No evidence of pulmonary emboli. 2 mm subpleural nodule on the right. No follow-up needed if patient is low-risk. Non-contrast chest CT can be considered in 12 months if patient is high-risk. This recommendation follows the consensus statement: Guidelines for Management of Incidental Pulmonary Nodules Detected on CT Images: From the Fleischner Society 2017; Radiology 2017;  [id number].  11/20/15 CT C-spine: IMPRESSION: 1. No evidence of fracture or subluxation along the cervical spine. 2. Mild calcification at the carotid bifurcations bilaterally. Carotid ultrasound could be considered for further evaluation, when and as deemed clinically appropriate.  Preoperative labs. A1c 10.4 on 11/19/15. She reported fasting CBGs from 170's-180's. Cheryl at Dr. Jess Barters office notified of elevated A1c result. If case remains as scheduled then patient will get a fasting CBG on arrival the day of surgery. If result is acceptable then I would anticipate that she could proceed as planned. Depending on length of stay, she would likely benefit from a Hospitalist consult for DM management or would need close out-patient follow-up.   George Hugh [facility] Short Stay Center/Anesthesiology Phone [phone] 01/02/2016 10:36 AM

## 2016-01-02 NOTE — Telephone Encounter (Signed)
FYI- scheduled for left ORIF lisfranc joint and gastrocnemius recession on 01/03/16.  Ebony Hail, [place] with anesthesia called to report patient's pre-op A1c of 10.4. Patient also reports fasting CBG of 170-180s..  They will recheck tomorrow am before surgery.

## 2016-01-03 ENCOUNTER — Ambulatory Visit (HOSPITAL_COMMUNITY): Payer: BLUE CROSS/BLUE SHIELD | Admitting: Vascular Surgery

## 2016-01-03 ENCOUNTER — Encounter (HOSPITAL_COMMUNITY)
Admission: RE | Disposition: A | Discharge status: Home / Self Care | Payer: Self-pay | Source: Ambulatory Visit | Attending: Orthopedic Surgery

## 2016-01-03 ENCOUNTER — Ambulatory Visit (HOSPITAL_COMMUNITY)
Admission: RE | Admit: 2016-01-03 | Discharge: 2016-01-03 | Disposition: A | Discharge status: Home / Self Care | Payer: BLUE CROSS/BLUE SHIELD | Source: Ambulatory Visit | Attending: Orthopedic Surgery | Admitting: Orthopedic Surgery

## 2016-01-03 ENCOUNTER — Encounter (HOSPITAL_COMMUNITY): Payer: Self-pay | Admitting: Certified Registered"

## 2016-01-03 ENCOUNTER — Ambulatory Visit (HOSPITAL_COMMUNITY): Payer: BLUE CROSS/BLUE SHIELD | Admitting: Anesthesiology

## 2016-01-03 DIAGNOSIS — Z9889 Other specified postprocedural states: Secondary | ICD-10-CM | POA: Insufficient documentation

## 2016-01-03 DIAGNOSIS — S93325S Dislocation of tarsometatarsal joint of left foot, sequela: Secondary | ICD-10-CM | POA: Diagnosis not present

## 2016-01-03 DIAGNOSIS — S93326A Dislocation of tarsometatarsal joint of unspecified foot, initial encounter: Secondary | ICD-10-CM | POA: Insufficient documentation

## 2016-01-03 DIAGNOSIS — Z813 Family history of other psychoactive substance abuse and dependence: Secondary | ICD-10-CM | POA: Insufficient documentation

## 2016-01-03 DIAGNOSIS — K219 Gastro-esophageal reflux disease without esophagitis: Secondary | ICD-10-CM | POA: Insufficient documentation

## 2016-01-03 DIAGNOSIS — Z87442 Personal history of urinary calculi: Secondary | ICD-10-CM | POA: Insufficient documentation

## 2016-01-03 DIAGNOSIS — M7752 Other enthesopathy of left foot: Secondary | ICD-10-CM | POA: Insufficient documentation

## 2016-01-03 DIAGNOSIS — Z800 Family history of malignant neoplasm of digestive organs: Secondary | ICD-10-CM | POA: Insufficient documentation

## 2016-01-03 DIAGNOSIS — Z8673 Personal history of transient ischemic attack (TIA), and cerebral infarction without residual deficits: Secondary | ICD-10-CM | POA: Insufficient documentation

## 2016-01-03 DIAGNOSIS — Z8249 Family history of ischemic heart disease and other diseases of the circulatory system: Secondary | ICD-10-CM | POA: Insufficient documentation

## 2016-01-03 DIAGNOSIS — Z886 Allergy status to analgesic agent status: Secondary | ICD-10-CM | POA: Insufficient documentation

## 2016-01-03 DIAGNOSIS — F4310 Post-traumatic stress disorder, unspecified: Secondary | ICD-10-CM | POA: Insufficient documentation

## 2016-01-03 DIAGNOSIS — Z820 Family history of epilepsy and other diseases of the nervous system: Secondary | ICD-10-CM | POA: Insufficient documentation

## 2016-01-03 DIAGNOSIS — Z881 Allergy status to other antibiotic agents status: Secondary | ICD-10-CM | POA: Insufficient documentation

## 2016-01-03 DIAGNOSIS — M797 Fibromyalgia: Secondary | ICD-10-CM | POA: Insufficient documentation

## 2016-01-03 DIAGNOSIS — Z7982 Long term (current) use of aspirin: Secondary | ICD-10-CM | POA: Insufficient documentation

## 2016-01-03 DIAGNOSIS — Z807 Family history of other malignant neoplasms of lymphoid, hematopoietic and related tissues: Secondary | ICD-10-CM | POA: Insufficient documentation

## 2016-01-03 DIAGNOSIS — F329 Major depressive disorder, single episode, unspecified: Secondary | ICD-10-CM | POA: Insufficient documentation

## 2016-01-03 DIAGNOSIS — Z841 Family history of disorders of kidney and ureter: Secondary | ICD-10-CM | POA: Insufficient documentation

## 2016-01-03 DIAGNOSIS — Z79899 Other long term (current) drug therapy: Secondary | ICD-10-CM | POA: Insufficient documentation

## 2016-01-03 DIAGNOSIS — Z794 Long term (current) use of insulin: Secondary | ICD-10-CM | POA: Insufficient documentation

## 2016-01-03 DIAGNOSIS — E785 Hyperlipidemia, unspecified: Secondary | ICD-10-CM | POA: Insufficient documentation

## 2016-01-03 DIAGNOSIS — M6702 Short Achilles tendon (acquired), left ankle: Secondary | ICD-10-CM

## 2016-01-03 DIAGNOSIS — Z90710 Acquired absence of both cervix and uterus: Secondary | ICD-10-CM | POA: Insufficient documentation

## 2016-01-03 DIAGNOSIS — Z86711 Personal history of pulmonary embolism: Secondary | ICD-10-CM | POA: Insufficient documentation

## 2016-01-03 DIAGNOSIS — X58XXXA Exposure to other specified factors, initial encounter: Secondary | ICD-10-CM | POA: Insufficient documentation

## 2016-01-03 DIAGNOSIS — Z9049 Acquired absence of other specified parts of digestive tract: Secondary | ICD-10-CM | POA: Insufficient documentation

## 2016-01-03 DIAGNOSIS — E1042 Type 1 diabetes mellitus with diabetic polyneuropathy: Secondary | ICD-10-CM | POA: Insufficient documentation

## 2016-01-03 DIAGNOSIS — Z818 Family history of other mental and behavioral disorders: Secondary | ICD-10-CM | POA: Insufficient documentation

## 2016-01-03 DIAGNOSIS — Z811 Family history of alcohol abuse and dependence: Secondary | ICD-10-CM | POA: Insufficient documentation

## 2016-01-03 HISTORY — PX: ORIF ANKLE FRACTURE: SHX5408

## 2016-01-03 LAB — GLUCOSE, CAPILLARY
Glucose-Capillary: 101 mg/dL — ABNORMAL HIGH (ref 65–99)
Glucose-Capillary: 154 mg/dL — ABNORMAL HIGH (ref 65–99)

## 2016-01-03 SURGERY — OPEN REDUCTION INTERNAL FIXATION (ORIF) ANKLE FRACTURE
Anesthesia: Regional | Site: Foot | Laterality: Left

## 2016-01-03 MED ORDER — LIDOCAINE 2% (20 MG/ML) 5 ML SYRINGE
INTRAMUSCULAR | Status: DC | PRN
  Administered 2016-01-03: 20 mg via INTRAVENOUS

## 2016-01-03 MED ORDER — ONDANSETRON HCL 4 MG/2ML IJ SOLN
INTRAMUSCULAR | Status: DC | PRN
  Administered 2016-01-03: 4 mg via INTRAVENOUS

## 2016-01-03 MED ORDER — CLINDAMYCIN PHOSPHATE 900 MG/50ML IV SOLN
900.0000 mg | INTRAVENOUS | Status: AC
  Administered 2016-01-03: 900 mg via INTRAVENOUS
  Filled 2016-01-03: qty 50

## 2016-01-03 MED ORDER — BUPIVACAINE HCL (PF) 0.5 % IJ SOLN
INTRAMUSCULAR | Status: DC | PRN
  Administered 2016-01-03: 30 mL via PERINEURAL

## 2016-01-03 MED ORDER — ROPIVACAINE HCL 5 MG/ML IJ SOLN
INTRAMUSCULAR | Status: DC | PRN
  Administered 2016-01-03: 20 mL via PERINEURAL

## 2016-01-03 MED ORDER — OXYCODONE-ACETAMINOPHEN 5-325 MG PO TABS: 1.0000 | ORAL_TABLET | ORAL | 0 refills | Status: DC | PRN

## 2016-01-03 MED ORDER — PROPOFOL 10 MG/ML IV BOLUS
INTRAVENOUS | Status: DC | PRN
Start: 2016-01-03 — End: 2016-01-03
  Administered 2016-01-03: 200 mg via INTRAVENOUS

## 2016-01-03 MED ORDER — HYDROMORPHONE HCL 1 MG/ML IJ SOLN: 0.2500 mg | INTRAMUSCULAR | Status: DC | PRN

## 2016-01-03 MED ORDER — ONDANSETRON HCL 4 MG/2ML IJ SOLN
INTRAMUSCULAR | Status: AC
  Filled 2016-01-03: qty 2

## 2016-01-03 MED ORDER — LIDOCAINE 2% (20 MG/ML) 5 ML SYRINGE
INTRAMUSCULAR | Status: AC
  Filled 2016-01-03: qty 5

## 2016-01-03 MED ORDER — FENTANYL CITRATE (PF) 100 MCG/2ML IJ SOLN
INTRAMUSCULAR | Status: AC
  Filled 2016-01-03: qty 2

## 2016-01-03 MED ORDER — FENTANYL CITRATE (PF) 100 MCG/2ML IJ SOLN
INTRAMUSCULAR | Status: DC | PRN
  Administered 2016-01-03: 50 ug via INTRAVENOUS
  Administered 2016-01-03: 100 ug via INTRAVENOUS
  Administered 2016-01-03: 50 ug via INTRAVENOUS

## 2016-01-03 MED ORDER — PROMETHAZINE HCL 25 MG/ML IJ SOLN: 6.2500 mg | INTRAMUSCULAR | Status: DC | PRN

## 2016-01-03 MED ORDER — CHLORHEXIDINE GLUCONATE 4 % EX LIQD: 60.0000 mL | Freq: Once | CUTANEOUS | Status: DC

## 2016-01-03 MED ORDER — MIDAZOLAM HCL 5 MG/5ML IJ SOLN
INTRAMUSCULAR | Status: DC | PRN
  Administered 2016-01-03: 2 mg via INTRAVENOUS

## 2016-01-03 MED ORDER — 0.9 % SODIUM CHLORIDE (POUR BTL) OPTIME
TOPICAL | Status: DC | PRN
Start: 2016-01-03 — End: 2016-01-03
  Administered 2016-01-03: 1000 mL

## 2016-01-03 MED ORDER — LACTATED RINGERS IV SOLN
INTRAVENOUS | Status: DC | PRN
  Administered 2016-01-03: 07:00:00 via INTRAVENOUS

## 2016-01-03 MED ORDER — MIDAZOLAM HCL 2 MG/2ML IJ SOLN
INTRAMUSCULAR | Status: AC
  Filled 2016-01-03: qty 2

## 2016-01-03 SURGICAL SUPPLY — 43 items
BANDAGE ESMARK 6X9 LF (GAUZE/BANDAGES/DRESSINGS) IMPLANT
BNDG COHESIVE 4X5 TAN STRL (GAUZE/BANDAGES/DRESSINGS) ×2 IMPLANT
BNDG ESMARK 6X9 LF (GAUZE/BANDAGES/DRESSINGS)
BNDG GAUZE ELAST 4 BULKY (GAUZE/BANDAGES/DRESSINGS) ×2 IMPLANT
COVER SURGICAL LIGHT HANDLE (MISCELLANEOUS) ×4 IMPLANT
CUFF TOURNIQUET SINGLE 34IN LL (TOURNIQUET CUFF) IMPLANT
CUFF TOURNIQUET SINGLE 44IN (TOURNIQUET CUFF) IMPLANT
DRAPE INCISE IOBAN 66X45 STRL (DRAPES) ×2 IMPLANT
DRAPE OEC MINIVIEW 54X84 (DRAPES) IMPLANT
DRAPE PROXIMA HALF (DRAPES) ×2 IMPLANT
DRAPE U-SHAPE 47X51 STRL (DRAPES) ×2 IMPLANT
DRSG ADAPTIC 3X8 NADH LF (GAUZE/BANDAGES/DRESSINGS) ×2 IMPLANT
DRSG PAD ABDOMINAL 8X10 ST (GAUZE/BANDAGES/DRESSINGS) ×4 IMPLANT
DURAPREP 26ML APPLICATOR (WOUND CARE) ×2 IMPLANT
ELECT REM PT RETURN 9FT ADLT (ELECTROSURGICAL) ×2
ELECTRODE REM PT RTRN 9FT ADLT (ELECTROSURGICAL) ×1 IMPLANT
GAUZE SPONGE 4X4 12PLY STRL (GAUZE/BANDAGES/DRESSINGS) ×2 IMPLANT
GLOVE BIOGEL PI IND STRL 9 (GLOVE) ×1 IMPLANT
GLOVE BIOGEL PI INDICATOR 9 (GLOVE) ×1
GLOVE SURG ORTHO 9.0 STRL STRW (GLOVE) ×2 IMPLANT
GOWN STRL REUS W/ TWL XL LVL3 (GOWN DISPOSABLE) ×3 IMPLANT
GOWN STRL REUS W/TWL XL LVL3 (GOWN DISPOSABLE) ×3
GUIDEWIRE NON THREAD 1.6MM (WIRE) ×4 IMPLANT
KIT BASIN OR (CUSTOM PROCEDURE TRAY) ×2 IMPLANT
KIT ROOM TURNOVER OR (KITS) ×2 IMPLANT
MANIFOLD NEPTUNE II (INSTRUMENTS) ×2 IMPLANT
NS IRRIG 1000ML POUR BTL (IV SOLUTION) ×2 IMPLANT
PACK ORTHO EXTREMITY (CUSTOM PROCEDURE TRAY) ×2 IMPLANT
PAD ARMBOARD 7.5X6 YLW CONV (MISCELLANEOUS) ×4 IMPLANT
SCREW COMP HEADLESS 4.5X36MM (Screw) ×2 IMPLANT
SCREW COMPR HDLS ST 4.5X40 (Screw) ×2 IMPLANT
SCREW COMPRESISON HL 4.5X44MM (Screw) ×2 IMPLANT
SPONGE GAUZE 4X4 12PLY STER LF (GAUZE/BANDAGES/DRESSINGS) ×2 IMPLANT
SPONGE LAP 18X18 X RAY DECT (DISPOSABLE) ×2 IMPLANT
STAPLER VISISTAT 35W (STAPLE) IMPLANT
SUCTION FRAZIER HANDLE 10FR (MISCELLANEOUS) ×1
SUCTION TUBE FRAZIER 10FR DISP (MISCELLANEOUS) ×1 IMPLANT
SUT ETHILON 2 0 PSLX (SUTURE) ×2 IMPLANT
SUT VIC AB 2-0 CT1 27 (SUTURE) ×2
SUT VIC AB 2-0 CT1 TAPERPNT 27 (SUTURE) ×2 IMPLANT
TOWEL OR 17X24 6PK STRL BLUE (TOWEL DISPOSABLE) ×2 IMPLANT
TOWEL OR 17X26 10 PK STRL BLUE (TOWEL DISPOSABLE) ×2 IMPLANT
TUBE CONNECTING 12X1/4 (SUCTIONS) ×2 IMPLANT

## 2016-01-03 NOTE — Anesthesia Procedure Notes (Addendum)
Anesthesia Regional Block:  Popliteal block  Pre-Anesthetic Checklist: ,, timeout performed, Correct Patient, Correct Site, Correct Laterality, Correct Procedure, Correct Position, site marked, Risks and benefits discussed,  Surgical consent,  Pre-op evaluation,  At surgeon's request and post-op pain management  Laterality: Left  Prep: chloraprep       Needles:  Injection technique: Single-shot  Needle Type: Echogenic Needle     Needle Length: 9cm 9 cm Needle Gauge: 21 and 21 G    Additional Needles:  Procedures: ultrasound guided (picture in chart) Popliteal block Narrative:  Start time: 01/03/2016 7:24 AM End time: 01/03/2016 7:28 AM Injection made incrementally with aspirations every 5 mL.  Performed by: Personally  Anesthesiologist: Suzette Battiest

## 2016-01-03 NOTE — Anesthesia Procedure Notes (Signed)
Procedure Name: LMA Insertion Date/Time: 01/03/2016 7:37 AM Performed by: Manuela Schwartz B Pre-anesthesia Checklist: Patient identified, Emergency Drugs available, Suction available, Patient being monitored and Timeout performed Patient Re-evaluated:Patient Re-evaluated prior to inductionOxygen Delivery Method: Circle system utilized Preoxygenation: Pre-oxygenation with 100% oxygen Intubation Type: IV induction LMA: LMA inserted LMA Size: 4.0 Number of attempts: 1 Placement Confirmation: positive ETCO2 and breath sounds checked- equal and bilateral Tube secured with: Tape Dental Injury: Teeth and Oropharynx as per pre-operative assessment

## 2016-01-03 NOTE — Anesthesia Preprocedure Evaluation (Addendum)
Anesthesia Evaluation  Patient identified by MRN, date of birth, ID band Patient awake    Reviewed: Allergy & Precautions, NPO status , Patient's Chart, lab work & pertinent test results  Airway Mallampati: II  TM Distance: >3 FB Neck ROM: Full    Dental  (+) Dental Advisory Given   Pulmonary neg pulmonary ROS,    breath sounds clear to auscultation       Cardiovascular negative cardio ROS   Rhythm:Regular Rate:Normal     Neuro/Psych Anxiety Depression TIA Neuromuscular disease    GI/Hepatic Neg liver ROS, GERD  ,  Endo/Other  diabetes, Type 1, Insulin Dependent  Renal/GU Renal disease     Musculoskeletal  (+) Arthritis ,   Abdominal   Peds  Hematology negative hematology ROS (+)   Anesthesia Other Findings   Reproductive/Obstetrics                            Lab Results  Component Value Date   WBC 10.9 (H) 01/01/2016   HGB 16.4 (H) 01/01/2016   HCT 47.9 (H) 01/01/2016   MCV 93.7 01/01/2016   PLT 377 01/01/2016   Lab Results  Component Value Date   CREATININE 0.85 01/01/2016   BUN 6 01/01/2016   NA 138 01/01/2016   K 4.0 01/01/2016   CL 102 01/01/2016   CO2 26 01/01/2016    Anesthesia Physical Anesthesia Plan  ASA: II  Anesthesia Plan: General and Regional   Post-op Pain Management:  Regional for Post-op pain   Induction: Intravenous  Airway Management Planned: LMA  Additional Equipment:   Intra-op Plan:   Post-operative Plan: Extubation in OR  Informed Consent: I have reviewed the patients History and Physical, chart, labs and discussed the procedure including the risks, benefits and alternatives for the proposed anesthesia with the patient or authorized representative who has indicated his/her understanding and acceptance.   Dental advisory given  Plan Discussed with: CRNA  Anesthesia Plan Comments:         Anesthesia Quick Evaluation

## 2016-01-03 NOTE — Progress Notes (Signed)
Orthopedic Tech Progress Note Patient Details:  [name] 1971/01/04 [id number]  Patient ID: Maureen [name], female   DOB: November 20, 1970, 45 y.o.   MRN: [id number]   Hildred Priest 01/03/2016, 11:08 AM Viewed order from doctor's order list

## 2016-01-03 NOTE — Transfer of Care (Signed)
Immediate Anesthesia Transfer of Care Note  Patient: Maureen [name]  Procedure(s) Performed: Procedure(s): OPEN REDUCTION INTERNAL FIXATION (ORIF) LEFT LISFRANC JOINT, GASTROC RECESSION (Left)  Patient Location: PACU  Anesthesia Type:General  Level of Consciousness: sedated  Airway & Oxygen Therapy: Patient Spontanous Breathing and Patient connected to face mask oxygen  Post-op Assessment: Report given to RN and Post -op Vital signs reviewed and stable  Post vital signs: Reviewed and stable  Last Vitals:  Vitals:   01/03/16 0605  BP: (!) 152/62  Pulse: 82  Resp: 18  Temp: 36.8 C    Last Pain: There were no vitals filed for this visit.       Complications: No apparent anesthesia complications

## 2016-01-03 NOTE — Anesthesia Procedure Notes (Signed)
Anesthesia Regional Block:  Adductor canal block  Pre-Anesthetic Checklist: ,, timeout performed, Correct Patient, Correct Site, Correct Laterality, Correct Procedure, Correct Position, site marked, Risks and benefits discussed,  Surgical consent,  Pre-op evaluation,  At surgeon's request and post-op pain management  Laterality: Left  Prep: chloraprep       Needles:  Injection technique: Single-shot  Needle Type: Echogenic Needle     Needle Length: 9cm 9 cm Needle Gauge: 21 and 21 G    Additional Needles:  Procedures: ultrasound guided (picture in chart) Adductor canal block Narrative:  Start time: 01/03/2016 7:16 AM End time: 01/03/2016 7:23 AM Injection made incrementally with aspirations every 5 mL.  Performed by: Personally  Anesthesiologist: Suzette Battiest

## 2016-01-03 NOTE — Progress Notes (Signed)
Orthopedic Tech Progress Note Patient Details:  Maureen [name] 10-07-1970 [id number]  Ortho Devices Type of Ortho Device: CAM walker, Crutches Ortho Device/Splint Location: lle Ortho Device/Splint Interventions: Application   Unika Nazareno 01/03/2016, 11:07 AM

## 2016-01-03 NOTE — Op Note (Signed)
01/03/2016  8:28 AM  PATIENT:  Maureen [name]    PRE-OPERATIVE DIAGNOSIS:  Lisfranc Fracture Left Foot, Achilles Contracture  POST-OPERATIVE DIAGNOSIS:  Same  PROCEDURE:  OPEN REDUCTION INTERNAL FIXATION (ORIF) LEFT LISFRANC JOINT, GASTROC RECESSION  SURGEON:  Newt Minion, MD  PHYSICIAN ASSISTANT:None ANESTHESIA:   General  PREOPERATIVE INDICATIONS:  [name] is a  45 y.o. female with a diagnosis of Lisfranc Fracture Left Foot, Achilles Contracture who failed conservative measures and elected for surgical management.    The risks benefits and alternatives were discussed with the patient preoperatively including but not limited to the risks of infection, bleeding, nerve injury, cardiopulmonary complications, the need for revision surgery, among others, and the patient was willing to proceed.  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 After adequate levels anesthesia obtained patient's left lower extremity was prepped using DuraPrep draped into a sterile field a timeout was called. A dorsal incision was made over the first webspace blunt dissection was carried down to the EHL this was retracted laterally. The base of the first metatarsal medial cuneiform was debrided the joint was unstable the joint was aligned and stabilized with K wire from the base of the great toe first metatarsal into the medial cuneiform. The Lisfranc joint complex was then debrided of osteophytic bone spurs and fibula this tissue. A K wire was inserted from the medial cuneiform into the base of the second and third metatarsal C-arm floss be verified alignment. The joints were then reduced with a 4.5 headless cannulated screw. C-arm fluoroscopy verified alignment. The wound was irrigated normal saline incision  closed using 2-0 nylon. Attention was then focused on the gastrocnemius muscle a medial longitudinal incision was made 15 cm proximal to the medial malleolus this was carried bluntly down to the gastrocnemius fascia and the fascia was released which brought her dorsiflexion from about 10 short of neutral to about 20 past neutral. The wound was irrigated normal saline incision closed using 2-0 nylon. A sterile compressive dressing was applied patient was extubated taken the PACU in stable condition.

## 2016-01-03 NOTE — H&P (Signed)
Maureen [name] is an 45 y.o. female.   Chief Complaint: Left foot pain with heel cord tightness HPI: Patient is a 45 year old woman who sustained a Lisfranc fracture dislocation. Patient has had a nonunion and presents at this time for open reduction internal fixation as well as gastrocnemius recession.  Past Medical History:  Diagnosis Date  . Abdominal pain   . Achilles tendon contracture, left    with lisfrabc fracture  . Anxiety   . Arthritis   . Biliary dyskinesia   . Cervical strain   . Closed head injury 2010  . Depression   . Elevated LFTs   . Fibromyalgia   . GERD (gastroesophageal reflux disease)   . History of kidney stones   . Hyperlipidemia   . Hypoglycemia   . Joint pain   . Major depressive disorder   . Migraine    hx of none recent  . Nausea & vomiting   . Pneumonia   . Post traumatic stress disorder (PTSD)   . Pulmonary embolism ([place])   . Renal stones   . Skin rash    due to medications  . Sleep trouble   . Stroke [facility])    " series of mini strokes around 2007"  . TIA (transient ischemic attack)   . Wears glasses   . 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 ABDOMINAL HYSTERECTOMY  2001  . APPENDECTOMY    . [place]  . CHOLECYSTECTOMY  02/10/2011   Procedure: LAPAROSCOPIC CHOLECYSTECTOMY WITH INTRAOPERATIVE CHOLANGIOGRAM;  Surgeon: Judieth Keens, DO;  Location: [facility];  Service: General;  Laterality: N/A;  . COLONOSCOPY    . DILATION AND CURETTAGE OF UTERUS    . exploratory laparomty  Mar 04, 2012  . EYE SURGERY  2006   laser eye surgery left eye  . KNEE SURGERY Left 2012  . LAPAROSCOPIC APPENDECTOMY N/A 10/12/2012   Procedure: DIAGNOSTIC APPENDECTOMY LAPAROSCOPIC;  Surgeon: Madilyn Hook, DO;  Location: [facility];  Service: General;  Laterality: N/A;  . lipoma removed  yrs ago  . LIVER BIOPSY    . ROBOTIC ASSISTED  LAPAROSCOPIC LYSIS OF ADHESION N/A 03/04/2012   Procedure: ROBOTIC ASSISTED LAPAROSCOPIC LYSIS OF EXTENSIVE ADHESIONS;  Surgeon: Claiborne Billings A. Pamala Hurry, MD;  Location: [facility];  Service: Gynecology;  Laterality: N/A;  . TONSILLECTOMY  2001    Family History  Problem Relation Age of Onset  . Cancer Father     lymphoma  . Nephrolithiasis Father   . Vascular Disease Father   . Alcohol abuse Father   . Drug abuse Father   . Cancer Maternal Grandmother     colon  . Hypertension Mother   . Heart disease Mother   . Cataracts Mother   . Depression Mother   . Diabetes Brother   . Drug abuse Brother   . Mental illness Maternal Grandfather   . Cancer Maternal Grandfather   . Heart disease Paternal Grandmother   . Heart disease Paternal Grandfather   . Suicidality Maternal Uncle    Social History:  reports that she has never smoked. She has never used smokeless tobacco. She reports that she does not drink alcohol or use drugs.  Allergies:  Allergies  Allergen Reactions  . Cephalexin Anaphylaxis, Shortness Of Breath and Rash    Pt was admitted to the hospital upon taking.   . Ibuprofen Nausea  And Vomiting and Rash  . Meloxicam Nausea Only    Medications Prior to Admission  Medication Sig Dispense Refill  . aspirin EC 325 MG tablet Take 325 mg by mouth daily.    Marland Kitchen atorvastatin (LIPITOR) 40 MG tablet Take 40 mg by mouth daily.    . famotidine (PEPCID) 20 MG tablet Take 1 tablet (20 mg total) by mouth 2 (two) times daily. 60 tablet 6  . gabapentin (NEURONTIN) 300 MG capsule Take 300 mg by mouth at bedtime.  2  . insulin aspart (NOVOLOG) 100 UNIT/ML injection Inject 8 Units into the skin 3 (three) times daily with meals. 10 mL 0  . LANTUS 100 UNIT/ML injection Inject 26 Units into the skin at bedtime.  0  . Oxycodone HCl 10 MG TABS Take 10 mg by mouth every 6 (six) hours as needed for pain.  0  . acetaminophen (TYLENOL) 500 MG tablet Take 1 tablet (500 mg total) by mouth every 8 (eight) hours  as needed for headache. 30 tablet 0  . lidocaine (LIDODERM) 5 % Place 1 patch onto the skin daily as needed (pain). Remove after 12 hours.  0    Results for orders placed or performed during the hospital encounter of 01/03/16 (from the past 48 hour(s))  Glucose, capillary     Status: Abnormal   Collection Time: 01/03/16  6:21 AM  Result Value Ref Range   Glucose-Capillary 101 (H) 65 - 99 mg/dL   Comment 1 Notify RN    Comment 2 Document in Chart    No results found.  Review of Systems  All other systems reviewed and are negative.   Blood pressure (!) 152/62, pulse 82, temperature 98.3 F (36.8 C), resp. rate 18, SpO2 100 %. Physical Exam  Examination patient is alert oriented no adenopathy well-dressed normal affect  She does have antalgic gait radiographs shows a nonunion at the fracture site. She has a palpable pulse. Patient's hemoglobin A1c was greater than 10. Assessment/Plan Assessment: Nonunion Lisfranc fracture left foot feels tightness with poorly controlled diabetic insensate neuropathy.  Plan: Due to failure of conservative care patient wishes to proceed with open reduction internal fixation. Discussed with her diabetes that she is at an increased risk of infection neurovascular injury persistent pain DVT pulmonary embolus potential for amputation. Patient states she understands wish to proceed at this time.  Newt Minion, MD 01/03/2016, 6:39 AM

## 2016-01-03 NOTE — Anesthesia Postprocedure Evaluation (Signed)
Anesthesia Post Note  Patient: Maureen [name]  Procedure(s) Performed: Procedure(s) (LRB): OPEN REDUCTION INTERNAL FIXATION (ORIF) LEFT LISFRANC JOINT, GASTROC RECESSION (Left)  Patient location during evaluation: PACU Anesthesia Type: General Level of consciousness: sedated Pain management: satisfactory to patient Vital Signs Assessment: post-procedure vital signs reviewed and stable Respiratory status: spontaneous breathing Cardiovascular status: stable Anesthetic complications: no    Last Vitals:  Vitals:   01/03/16 0845 01/03/16 0847  BP: 126/78 125/73  Pulse: 75 77  Resp:  16  Temp: 36.6 C     Last Pain:  Vitals:   01/03/16 0847  PainSc: 0-No pain                 Camry Robello EDWARD

## 2016-01-06 ENCOUNTER — Encounter (HOSPITAL_COMMUNITY): Payer: Self-pay | Admitting: Orthopedic Surgery

## 2016-01-06 ENCOUNTER — Telehealth (INDEPENDENT_AMBULATORY_CARE_PROVIDER_SITE_OTHER): Payer: Self-pay | Admitting: Orthopedic Surgery

## 2016-01-06 ENCOUNTER — Other Ambulatory Visit (INDEPENDENT_AMBULATORY_CARE_PROVIDER_SITE_OTHER): Payer: Self-pay | Admitting: Orthopedic Surgery

## 2016-01-06 MED ORDER — OXYCODONE-ACETAMINOPHEN 5-325 MG PO TABS: 1.0000 | ORAL_TABLET | Freq: Three times a day (TID) | ORAL | 0 refills | Status: DC | PRN

## 2016-01-06 NOTE — Telephone Encounter (Signed)
rx written

## 2016-01-06 NOTE — Telephone Encounter (Signed)
Pt had surgery with Dr Sharol Given on the 15th, and pt stated she has a couple of questions to ask that she forgot to ask- regarding the surgery. Pt number is [phone]

## 2016-01-06 NOTE — Telephone Encounter (Signed)
I called patient and answered her questions about dressing, boot, and other minor questions. She is wanting to know if she can have a new rx for oxycodone, she is worried she will run out for the holidays. I did tell her our office is closed on 12/25 and 12/26. She has follow up appointment on 01/15/16.

## 2016-01-07 ENCOUNTER — Encounter (INDEPENDENT_AMBULATORY_CARE_PROVIDER_SITE_OTHER): Payer: Self-pay | Admitting: Orthopedic Surgery

## 2016-01-07 ENCOUNTER — Telehealth (INDEPENDENT_AMBULATORY_CARE_PROVIDER_SITE_OTHER): Payer: Self-pay | Admitting: Orthopedic Surgery

## 2016-01-07 ENCOUNTER — Ambulatory Visit (INDEPENDENT_AMBULATORY_CARE_PROVIDER_SITE_OTHER): Payer: BLUE CROSS/BLUE SHIELD | Admitting: Family

## 2016-01-07 DIAGNOSIS — M6702 Short Achilles tendon (acquired), left ankle: Secondary | ICD-10-CM

## 2016-01-07 DIAGNOSIS — S93325S Dislocation of tarsometatarsal joint of left foot, sequela: Secondary | ICD-10-CM

## 2016-01-07 NOTE — Telephone Encounter (Signed)
Patient called advised the pain medicine (oxycodone) is not working and she is still in a lot of pain. Patient asked if she can get something a little stronger. The number to contact her is [phone]

## 2016-01-07 NOTE — Telephone Encounter (Signed)
I called patient and advised that if she is having that level of pain taking oxycodone she would need office visit for evaluation. Made appointment for her to come in today.

## 2016-01-07 NOTE — Progress Notes (Signed)
Office Visit Note   Patient: DEMARIYAH PIZZO           Date of Birth: 1970/11/24           MRN: [id number] Visit Date: 01/07/2016              Requested by: No referring provider defined for this encounter. PCP: No primary care provider on file.   Assessment & Plan: Visit Diagnoses:  1. Lisfranc dislocation, left, sequela   2. Short Achilles tendon (acquired), left ankle     Plan: Follow up in 1 more week. Will remain nonweight bearing. May begin daily dial soap cleansing. Remove boot daily to work on range of motion left ankle.  Follow-Up Instructions: Return in about 1 week (around 01/14/2016).   Orders:  No orders of the defined types were placed in this encounter.  No orders of the defined types were placed in this encounter.     Procedures: No procedures performed   Clinical Data: No additional findings.   Subjective: Chief Complaint  Patient presents with  . Left Foot - Pain    OPEN REDUCTION INTERNAL FIXATION (ORIF) LEFT LISFRANC JOINT, GASTROC RECESSION - Left 01/03/16    Patient is a 45 year old woman seen status post ORIF lisfranc joint left with gastrocnemius recession. She is 4 days post op. She complains of increased pain and burning. She is taking oxycodone for her pain and feels minimal relief. Surgical dressing removed, there is minimal bleeding and swelling at foot.     Review of Systems  Constitutional: Negative for chills and fever.     Objective: Vital Signs: There were no vitals taken for this visit.  Physical Exam  Constitutional: She is oriented to person, place, and time. She appears well-developed and well-nourished.  Pulmonary/Chest: Effort normal.  Musculoskeletal:  Left foot dorsal incision and gastroc incision are well approximated with sutures. No drainage. No erythema or sign of infection. Does have moderate swelling to the foot. No hypersensitivity to touch. Foot is normothermic.  Neurological: She is alert and oriented  to person, place, and time.  Psychiatric: She has a normal mood and affect.  Nursing note reviewed.   Ortho Exam  Specialty Comments:  No specialty comments available.  Imaging: No results found.   PMFS History: Patient Active Problem List   Diagnosis Date Noted  . Lisfranc dislocation, left, sequela 12/16/2015  . Short Achilles tendon (acquired), left ankle 12/16/2015  . Chest pain with moderate risk for cardiac etiology 11/29/2015  . Elevated troponin 11/19/2015  . Migraine headache 11/19/2015  . Neck pain 11/19/2015  . Nausea   . Acute kidney injury ([place]) 02/19/2015  . Left flank pain 02/19/2015  . DKA (diabetic ketoacidoses) ([place]) 10/24/2014  . HLD (hyperlipidemia) 10/24/2014  . Hx pulmonary embolism 09/28/2014  . Nausea vomiting and diarrhea 09/03/2014  . Abdominal pain 09/03/2014  . Abnormal LFTs 09/03/2014  . Dizziness 09/03/2014  . Acute pulmonary embolism ([place]) 09/03/2014  . Transaminitis 09/02/2014  . GAD (generalized anxiety disorder) 11/23/2013  . PTSD (post-traumatic stress disorder) 11/23/2013  . Severe major depression without psychotic features ([place]) 10/10/2013  . Major depression ([place]) 05/22/2013  . Chest pain 05/18/2012  . Endometriosis 12/08/2011  . Arthritis of left knee 12/08/2011  . Diabetic retinopathy ([place]) 12/08/2011  . Fibromyalgia 10/23/2011  . DM type 1 (diabetes mellitus, type 1) ([place]) 10/13/2011   Past Medical History:  Diagnosis Date  . Abdominal pain   . Achilles tendon contracture, left  with lisfrabc fracture  . Anxiety   . Arthritis   . Biliary dyskinesia   . Cervical strain   . Closed head injury 2010  . Depression   . Elevated LFTs   . Fibromyalgia   . GERD (gastroesophageal reflux disease)   . History of kidney stones   . Hyperlipidemia   . Hypoglycemia   . Joint pain   . Major depressive disorder   . Migraine    hx of none recent  . Nausea & vomiting   . Pneumonia   . Post traumatic stress disorder (PTSD)     . Pulmonary embolism ([place])   . Renal stones   . Skin rash    due to medications  . Sleep trouble   . Stroke [facility])    " series of mini strokes around 2007"  . TIA (transient ischemic attack)   . Wears glasses   . 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 Cancer Father     lymphoma  . Nephrolithiasis Father   . Vascular Disease Father   . Alcohol abuse Father   . Drug abuse Father   . Cancer Maternal Grandmother     colon  . Hypertension Mother   . Heart disease Mother   . Cataracts Mother   . Depression Mother   . Diabetes Brother   . Drug abuse Brother   . Mental illness Maternal Grandfather   . Cancer Maternal Grandfather   . Heart disease Paternal Grandmother   . Heart disease Paternal Grandfather   . Suicidality Maternal Uncle     Past Surgical History:  Procedure Laterality Date  . ABDOMINAL HYSTERECTOMY  2001  . APPENDECTOMY    . [place]  . CHOLECYSTECTOMY  02/10/2011   Procedure: LAPAROSCOPIC CHOLECYSTECTOMY WITH INTRAOPERATIVE CHOLANGIOGRAM;  Surgeon: Judieth Keens, DO;  Location: [place];  Service: General;  Laterality: N/A;  . COLONOSCOPY    . DILATION AND CURETTAGE OF UTERUS    . exploratory laparomty  Mar 04, 2012  . EYE SURGERY  2006   laser eye surgery left eye  . KNEE SURGERY Left 2012  . LAPAROSCOPIC APPENDECTOMY N/A 10/12/2012   Procedure: DIAGNOSTIC APPENDECTOMY LAPAROSCOPIC;  Surgeon: Madilyn Hook, DO;  Location[facility];  Service: General;  Laterality: N/A;  . lipoma removed  yrs ago  . LIVER BIOPSY    . ORIF ANKLE FRACTURE Left 01/03/2016   Procedure: OPEN REDUCTION INTERNAL FIXATION (ORIF) LEFT LISFRANC JOINT, GASTROC RECESSION;  Surgeon: Newt Minion, MD;  Location: [facility];  Service: Orthopedics;  Laterality: Left;  . ROBOTIC ASSISTED LAPAROSCOPIC LYSIS OF ADHESION N/A 03/04/2012   Procedure: ROBOTIC ASSISTED LAPAROSCOPIC LYSIS  OF EXTENSIVE ADHESIONS;  Surgeon: Claiborne Billings A. Pamala Hurry, MD;  Location: [facility];  Service: Gynecology;  Laterality: N/A;  . 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 Smoking status: Never Smoker  . Smokeless tobacco: Never Used  . Alcohol use No  . Drug use: No  . Sexual activity: Not on file

## 2016-01-07 NOTE — Addendum Note (Signed)
Addendum  created 01/07/16 1535 by Suzette Battiest, MD   Anesthesia Intra Blocks edited, Sign clinical note

## 2016-01-17 ENCOUNTER — Ambulatory Visit (INDEPENDENT_AMBULATORY_CARE_PROVIDER_SITE_OTHER): Payer: BLUE CROSS/BLUE SHIELD

## 2016-01-17 ENCOUNTER — Ambulatory Visit (INDEPENDENT_AMBULATORY_CARE_PROVIDER_SITE_OTHER): Payer: BLUE CROSS/BLUE SHIELD | Admitting: Orthopedic Surgery

## 2016-01-17 ENCOUNTER — Encounter (INDEPENDENT_AMBULATORY_CARE_PROVIDER_SITE_OTHER): Payer: Self-pay | Admitting: Orthopedic Surgery

## 2016-01-17 VITALS — Ht 64.0 in | Wt 178.0 lb

## 2016-01-17 DIAGNOSIS — M79672 Pain in left foot: Secondary | ICD-10-CM | POA: Diagnosis not present

## 2016-01-17 DIAGNOSIS — S93325S Dislocation of tarsometatarsal joint of left foot, sequela: Secondary | ICD-10-CM

## 2016-01-17 NOTE — Progress Notes (Signed)
Office Visit Note   Patient: Maureen [name]           Date of Birth: [birth year]           MRN: [id number] Visit Date: 01/17/2016              Requested by: [name], MD [address] [place], [place] [address] PCP: No primary care provider on file.   Assessment & Plan: Visit Diagnoses:  1. Pain in left foot   2. Lisfranc dislocation, left, sequela     Plan: Scar massage daily continue work on range of motion for heel cord stretching status post gastrocnemius recession weightbearing as tolerated with a fracture boot and crutches.  Repeat 3 view radiographs the left foot at follow-up anticipate full weightbearing at follow-up.  Follow-Up Instructions: Return in about 3 weeks (around 02/07/2016).   Orders:  Orders Placed This Encounter  Procedures  . XR Foot Complete Left   No orders of the defined types were placed in this encounter.     Procedures: No procedures performed   Clinical Data: No additional findings.   Subjective: Chief Complaint  Patient presents with  . Left Foot - Routine Post Op    OPEN REDUCTION INTERNAL FIXATION (ORIF) LEFT LISFRANC JOINT, GASTROC RECESSION - Left 01/03/16    Patient presents today for follow up ORIF lisfranc left foot with gastrocnemius recession 01/03/16. She is approximately 2 weeks post op.She is feeling much better, she states her pain and swelling have both decreased. She is weightbearing with fracture boot and crutches.   Patient states she is surprised that her foot feels this much better this seen.  Review of Systems   Objective: Vital Signs: Ht 5\' 4"  (1.626 m)   Wt 178 lb (80.7 kg)   BMI 30.55 kg/m   Physical Exam examination incisions are well-healed there were no cellulitis no drainage no signs of infection. Patient will have the sutures removed today she will start shea butter scar massage she does have a history of keloid. Recommended doing scar massage for a year.  Ortho Exam  Specialty  Comments:  No specialty comments available.  Imaging: Xr Foot Complete Left  Result Date: 01/17/2016 Three-view radiographs of the left foot show stable internal fixation for the Lisfranc fracture dislocation no malalignment no complicating issues.    PMFS History: Patient Active Problem List   Diagnosis Date Noted  . Lisfranc dislocation, left, sequela 12/16/2015  . Short Achilles tendon (acquired), left ankle 12/16/2015  . Chest pain with moderate risk for cardiac etiology 11/29/2015  . Elevated troponin 11/19/2015  . Migraine headache 11/19/2015  . Neck pain 11/19/2015  . Nausea   . Acute kidney injury ([place]) 02/19/2015  . Left flank pain 02/19/2015  . DKA (diabetic ketoacidoses) ([place]) 10/24/2014  . HLD (hyperlipidemia) 10/24/2014  . Hx pulmonary embolism 09/28/2014  . Nausea vomiting and diarrhea 09/03/2014  . Abdominal pain 09/03/2014  . Abnormal LFTs 09/03/2014  . Dizziness 09/03/2014  . Acute pulmonary embolism ([place]) 09/03/2014  . Transaminitis 09/02/2014  . GAD (generalized anxiety disorder) 11/23/2013  . PTSD (post-traumatic stress disorder) 11/23/2013  . Severe major depression without psychotic features ([place]) 10/10/2013  . Major depression ([place]) 05/22/2013  . Chest pain 05/18/2012  . Endometriosis 12/08/2011  . Arthritis of left knee 12/08/2011  . Diabetic retinopathy ([employer] [employer]) 12/08/2011  . Fibromyalgia 10/23/2011  . DM type 1 (diabetes mellitus, type 1) ([place]) 10/13/2011   Past Medical History:  Diagnosis Date  . Abdominal pain   .  Achilles tendon contracture, left    with lisfrabc fracture  . Anxiety   . Arthritis   . Biliary dyskinesia   . Cervical strain   . Closed head injury 2010  . Depression   . Elevated LFTs   . Fibromyalgia   . GERD (gastroesophageal reflux disease)   . History of kidney stones   . Hyperlipidemia   . Hypoglycemia   . Joint pain   . Major depressive disorder   . Migraine    hx of none recent  . Nausea & vomiting   .  Pneumonia   . Post traumatic stress disorder (PTSD)   . Pulmonary embolism ([place])   . Renal stones   . Skin rash    due to medications  . Sleep trouble   . Stroke [facility])    " series of mini strokes around 2007"  . TIA (transient ischemic attack)   . Wears glasses   . 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 Cancer Father     lymphoma  . Nephrolithiasis Father   . Vascular Disease Father   . Alcohol abuse Father   . Drug abuse Father   . Cancer Maternal Grandmother     colon  . Hypertension Mother   . Heart disease Mother   . Cataracts Mother   . Depression Mother   . Diabetes Brother   . Drug abuse Brother   . Mental illness Maternal Grandfather   . Cancer Maternal Grandfather   . Heart disease Paternal Grandmother   . Heart disease Paternal Grandfather   . Suicidality Maternal Uncle     Past Surgical History:  Procedure Laterality Date  . ABDOMINAL HYSTERECTOMY  2001  . APPENDECTOMY    . [place]  . CHOLECYSTECTOMY  02/10/2011   Procedure: LAPAROSCOPIC CHOLECYSTECTOMY WITH INTRAOPERATIVE CHOLANGIOGRAM;  Surgeon: Judieth Keens, DO;  Location: [facility];  Service: General;  Laterality: N/A;  . COLONOSCOPY    . DILATION AND CURETTAGE OF UTERUS    . exploratory laparomty  Mar 04, 2012  . EYE SURGERY  2006   laser eye surgery left eye  . KNEE SURGERY Left 2012  . LAPAROSCOPIC APPENDECTOMY N/A 10/12/2012   Procedure: DIAGNOSTIC APPENDECTOMY LAPAROSCOPIC;  Surgeon: Madilyn Hook, DO;  [facility];  Service: General;  Laterality: N/A;  . lipoma removed  yrs ago  . LIVER BIOPSY    . ORIF ANKLE FRACTURE Left 01/03/2016   Procedure: OPEN REDUCTION INTERNAL FIXATION (ORIF) LEFT LISFRANC JOINT, GASTROC RECESSION;  Surgeon: Newt Minion, MD;  Location: [facility];  Service: Orthopedics;  Laterality: Left;  . ROBOTIC ASSISTED LAPAROSCOPIC LYSIS OF ADHESION N/A  03/04/2012   Procedure: ROBOTIC ASSISTED LAPAROSCOPIC LYSIS OF EXTENSIVE ADHESIONS;  Surgeon: Claiborne Billings A. Pamala Hurry, MD;  Location: [facility];  Service: Gynecology;  Laterality: N/A;  . 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 Smoking status: Never Smoker  . Smokeless tobacco: Never Used  . Alcohol use No  . Drug use: No  . Sexual activity: Not on file

## 2016-01-27 IMAGING — CR DG CHEST 2V
2 series · 2 of 2 positions shown · non-contrast
Comparison: Multiple exams, including 07/18/2013

CLINICAL DATA: Mid chest pain and dizziness. Shortness of breath.
Duration: 3 days.

EXAM:
CHEST  2 VIEW

[w chest pa]
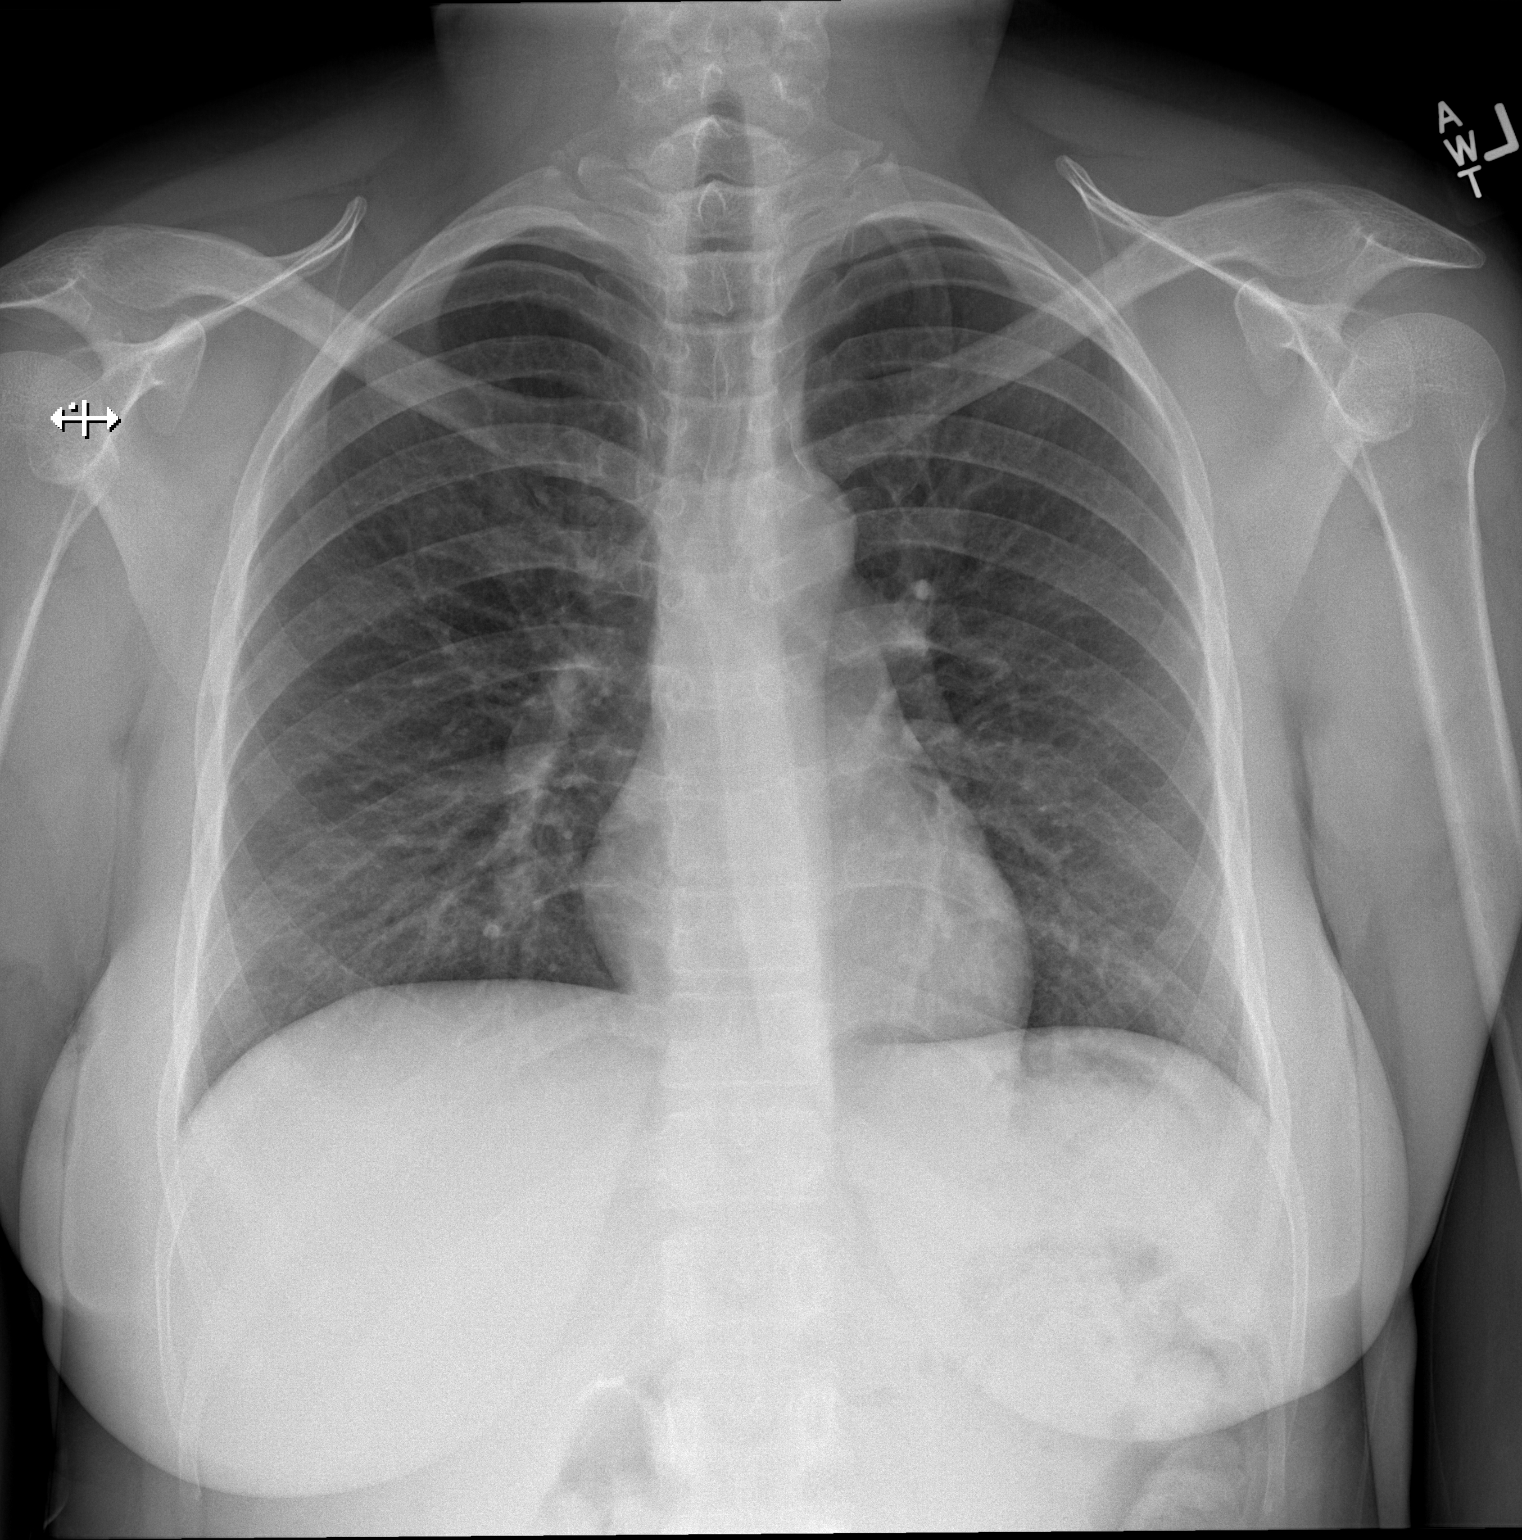

[w chest lat]
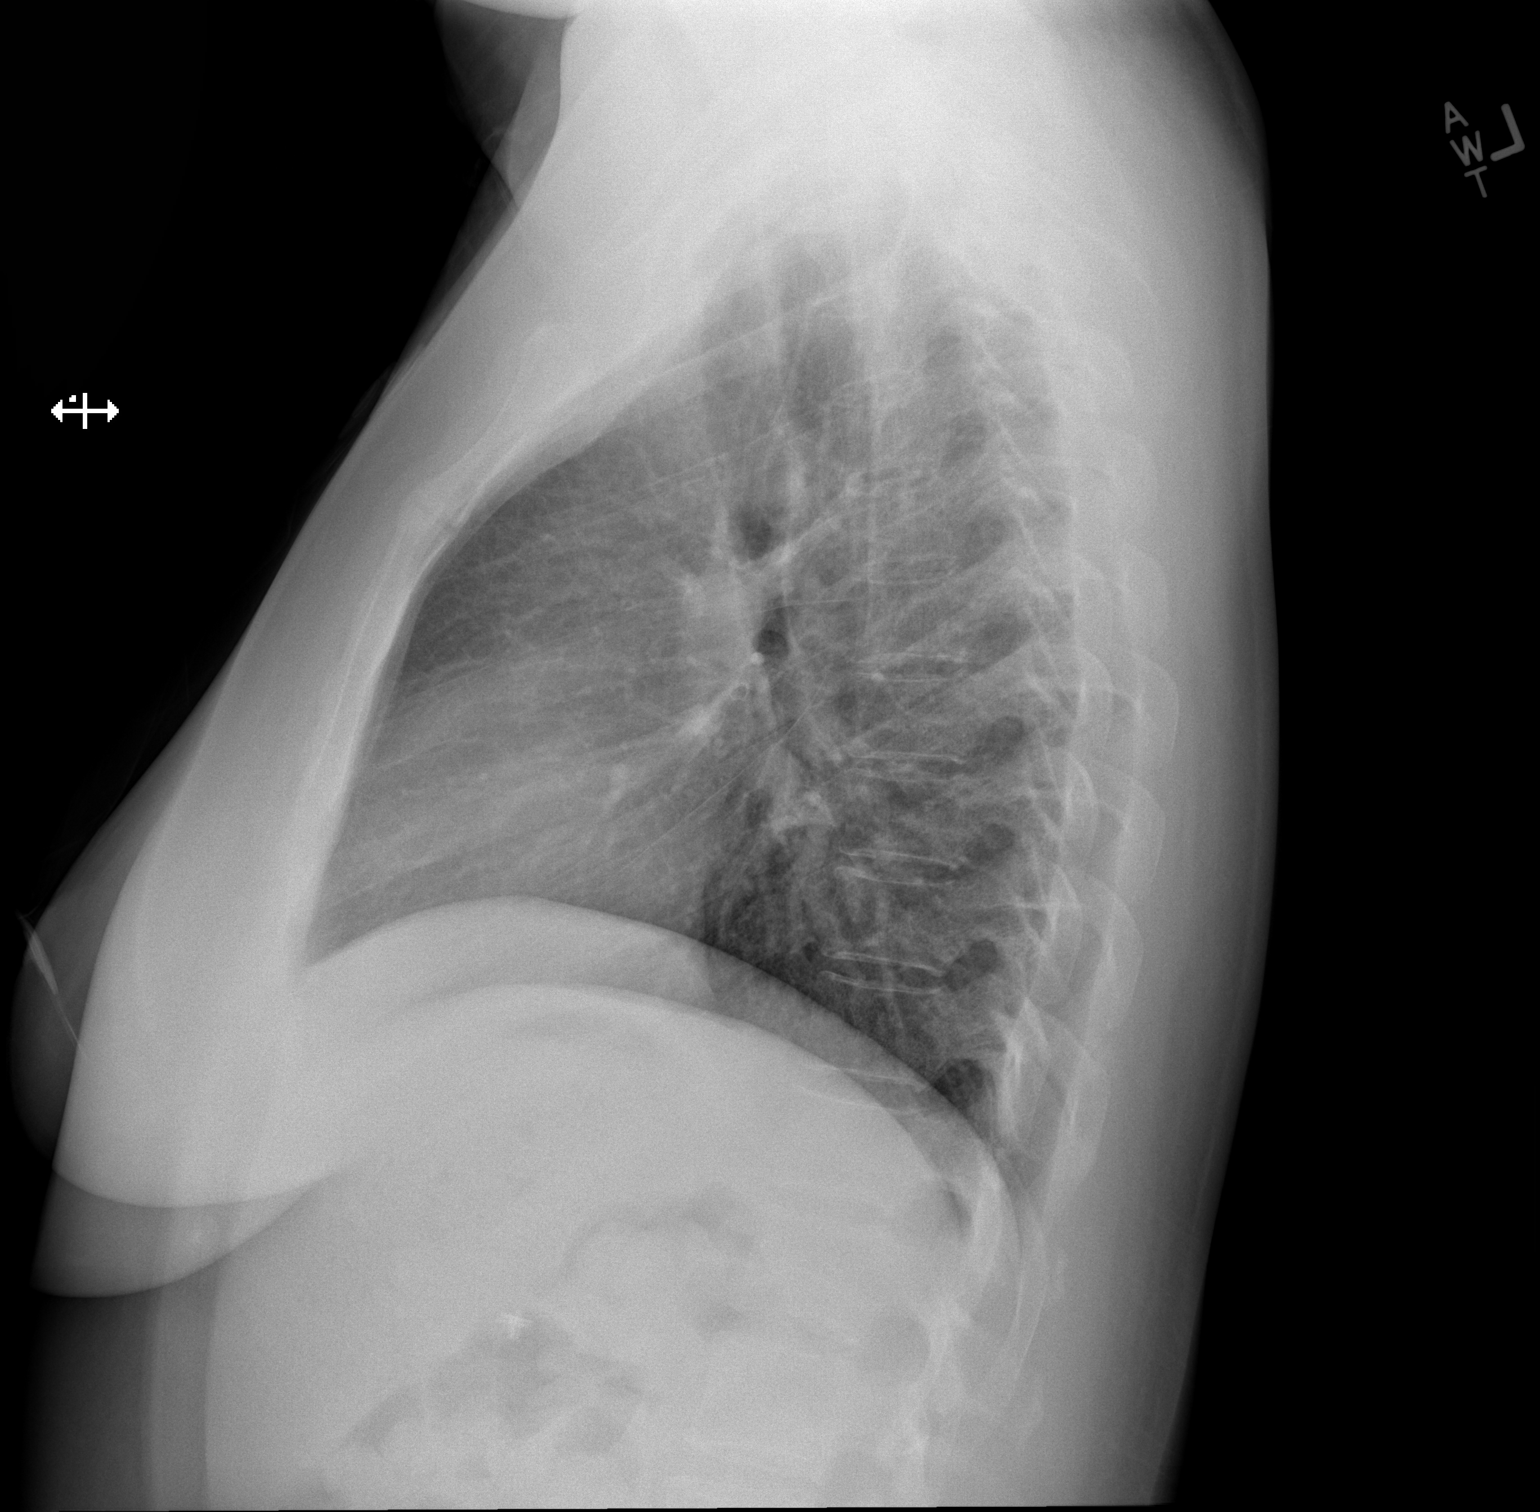

[2 of 2 positions shown; findings below may reference images not displayed]

FINDINGS: The heart size and mediastinal contours are within normal limits.
Both lungs are clear. The visualized skeletal structures are
unremarkable.
IMPRESSION: No active cardiopulmonary disease.

## 2016-01-29 ENCOUNTER — Ambulatory Visit (INDEPENDENT_AMBULATORY_CARE_PROVIDER_SITE_OTHER): Payer: Medicare PPO | Admitting: Family

## 2016-01-29 ENCOUNTER — Ambulatory Visit (INDEPENDENT_AMBULATORY_CARE_PROVIDER_SITE_OTHER): Payer: BLUE CROSS/BLUE SHIELD | Admitting: Family

## 2016-01-29 ENCOUNTER — Encounter (INDEPENDENT_AMBULATORY_CARE_PROVIDER_SITE_OTHER): Payer: Self-pay | Admitting: Family

## 2016-01-29 VITALS — Ht 64.0 in | Wt 178.0 lb

## 2016-01-29 DIAGNOSIS — S93325S Dislocation of tarsometatarsal joint of left foot, sequela: Secondary | ICD-10-CM

## 2016-01-29 DIAGNOSIS — E1065 Type 1 diabetes mellitus with hyperglycemia: Secondary | ICD-10-CM

## 2016-01-29 DIAGNOSIS — M6702 Short Achilles tendon (acquired), left ankle: Secondary | ICD-10-CM

## 2016-01-29 NOTE — Progress Notes (Signed)
Office Visit Note   Patient: Maureen [name]           Date of Birth: [birth year]           MRN: [id number] Visit Date: 01/29/2016              Requested by: No referring provider defined for this encounter. PCP: No primary care provider on file.  Chief Complaint  Patient presents with  . Left Foot - Wound Dehiscence    ORIF left lisfranc joint and gastrocnemius recession 01/03/16    HPI: Patient presents today for left foot evaluation. She is status post left foot ORIF lisfranc joint and gastrocnemius recession. There has been some increased swelling over the past week. There has been slight opening at incision. There is yellow drainage without odor. She is doing dry dressing changes. She is ambulating with crutches and cam walker. She was running fever yesterday and has been taking tylenol.   Patient is a 46 year old woman who is seen today as a work in for evaluation of concern of wound infection and increased pain. She is concerned about wound dehiscence. Is status post left foot ORIF Lisfranc joint with gastrocnemius recession on 01/03/16.  Does admit to weightbearing in the fracture boot around the home Which is painful.    Assessment & Plan: Visit Diagnoses:  1. Lisfranc dislocation, left, sequela   2. Short Achilles tendon (acquired), left ankle   3. Type 1 diabetes mellitus with hyperglycemia ([place])     Plan: Reassurance provided. Continue with daily wound cleansing. Have recommended that she resume her compression stocking on the right. She will cover the wound with dry dressings. Elevate may use ice for swelling. Reinforced that she is to continue nonweightbearing.  Follow-Up Instructions: Return for as scheduled.   Ortho Exam Dorsal incision over the left foot is healing well. There is a little eschar this is 2 cm x 2 mm in size. There is no depth to the wound. There is a little dried exudative tissue. There is no drainage no erythema no cellulitis no odor. Does  have minimal swelling.   Imaging: No results found.  Orders:  No orders of the defined types were placed in this encounter.  No orders of the defined types were placed in this encounter.    Procedures: No procedures performed  Clinical Data: No additional findings.  Subjective: Review of Systems  Constitutional: Negative for chills and fever.  Musculoskeletal: Positive for arthralgias.  Skin: Positive for wound.    Objective: Vital Signs: Ht 5\' 4"  (1.626 m)   Wt 178 lb (80.7 kg)   BMI 30.55 kg/m   Specialty Comments:  No specialty comments available.  PMFS History: Patient Active Problem List   Diagnosis Date Noted  . Lisfranc dislocation, left, sequela 12/16/2015  . Short Achilles tendon (acquired), left ankle 12/16/2015  . Chest pain with moderate risk for cardiac etiology 11/29/2015  . Elevated troponin 11/19/2015  . Migraine headache 11/19/2015  . Neck pain 11/19/2015  . Nausea   . Acute kidney injury ([place]) 02/19/2015  . Left flank pain 02/19/2015  . DKA (diabetic ketoacidoses) ([place]) 10/24/2014  . HLD (hyperlipidemia) 10/24/2014  . Hx pulmonary embolism 09/28/2014  . Nausea vomiting and diarrhea 09/03/2014  . Abdominal pain 09/03/2014  . Abnormal LFTs 09/03/2014  . Dizziness 09/03/2014  . Acute pulmonary embolism ([place]) 09/03/2014  . Transaminitis 09/02/2014  . GAD (generalized anxiety disorder) 11/23/2013  . PTSD (post-traumatic stress disorder) 11/23/2013  . Severe major  depression without psychotic features ([place]) 10/10/2013  . Major depression ([place]) 05/22/2013  . Chest pain 05/18/2012  . Endometriosis 12/08/2011  . Arthritis of left knee 12/08/2011  . Diabetic retinopathy ([place]) 12/08/2011  . Fibromyalgia 10/23/2011  . DM type 1 (diabetes mellitus, type 1) ([place]) 10/13/2011   Past Medical History:  Diagnosis Date  . Abdominal pain   . Achilles tendon contracture, left    with lisfrabc fracture  . Anxiety   . Arthritis   . Biliary dyskinesia    . Cervical strain   . Closed head injury 2010  . Depression   . Elevated LFTs   . Fibromyalgia   . GERD (gastroesophageal reflux disease)   . History of kidney stones   . Hyperlipidemia   . Hypoglycemia   . Joint pain   . Major depressive disorder   . Migraine    hx of none recent  . Nausea & vomiting   . Pneumonia   . Post traumatic stress disorder (PTSD)   . Pulmonary embolism ([place])   . Renal stones   . Skin rash    due to medications  . Sleep trouble   . Stroke [facility])    " series of mini strokes around 2007"  . TIA (transient ischemic attack)   . Wears glasses   . 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 Cancer Father     lymphoma  . Nephrolithiasis Father   . Vascular Disease Father   . Alcohol abuse Father   . Drug abuse Father   . Cancer Maternal Grandmother     colon  . Hypertension Mother   . Heart disease Mother   . Cataracts Mother   . Depression Mother   . Diabetes Brother   . Drug abuse Brother   . Mental illness Maternal Grandfather   . Cancer Maternal Grandfather   . Heart disease Paternal Grandmother   . Heart disease Paternal Grandfather   . Suicidality Maternal Uncle     Past Surgical History:  Procedure Laterality Date  . ABDOMINAL HYSTERECTOMY  2001  . APPENDECTOMY    . [place]  . CHOLECYSTECTOMY  02/10/2011   Procedure: LAPAROSCOPIC CHOLECYSTECTOMY WITH INTRAOPERATIVE CHOLANGIOGRAM;  Surgeon: Judieth Keens, DO;  Location: [facility];  Service: General;  Laterality: N/A;  . COLONOSCOPY    . DILATION AND CURETTAGE OF UTERUS    . exploratory laparomty  Mar 04, 2012  . EYE SURGERY  2006   laser eye surgery left eye  . KNEE SURGERY Left 2012  . LAPAROSCOPIC APPENDECTOMY N/A 10/12/2012   Procedure: DIAGNOSTIC APPENDECTOMY LAPAROSCOPIC;  Surgeon: Madilyn Hook, DO;  [facility];  Service: General;  Laterality: N/A;  .  lipoma removed  yrs ago  . LIVER BIOPSY    . ORIF ANKLE FRACTURE Left 01/03/2016   Procedure: OPEN REDUCTION INTERNAL FIXATION (ORIF) LEFT LISFRANC JOINT, GASTROC RECESSION;  Surgeon: Newt Minion, MD;  Location: [facility];  Service: Orthopedics;  Laterality: Left;  . ROBOTIC ASSISTED LAPAROSCOPIC LYSIS OF ADHESION N/A 03/04/2012   Procedure: ROBOTIC ASSISTED LAPAROSCOPIC LYSIS OF EXTENSIVE ADHESIONS;  Surgeon: Claiborne Billings A. Pamala Hurry, MD;  Location: [facility];  Service: Gynecology;  Laterality: N/A;  . 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  Smoking status: Never Smoker  . Smokeless tobacco: Never Used  . Alcohol use No  . Drug use: No  . Sexual activity: Not on file

## 2016-02-02 ENCOUNTER — Emergency Department (HOSPITAL_COMMUNITY): Payer: Medicare PPO

## 2016-02-02 ENCOUNTER — Encounter (HOSPITAL_COMMUNITY): Payer: Self-pay | Admitting: Emergency Medicine

## 2016-02-02 ENCOUNTER — Emergency Department (HOSPITAL_BASED_OUTPATIENT_CLINIC_OR_DEPARTMENT_OTHER): Admit: 2016-02-02 | Discharge: 2016-02-02 | Disposition: A | Discharge status: Home / Self Care | Payer: Medicare PPO

## 2016-02-02 ENCOUNTER — Other Ambulatory Visit: Payer: Self-pay

## 2016-02-02 ENCOUNTER — Emergency Department (HOSPITAL_COMMUNITY)
Admission: EM | Admit: 2016-02-02 | Discharge: 2016-02-03 | Disposition: A | Discharge status: Home / Self Care | Payer: Medicare PPO | Attending: Emergency Medicine | Admitting: Emergency Medicine

## 2016-02-02 DIAGNOSIS — I82412 Acute embolism and thrombosis of left femoral vein: Secondary | ICD-10-CM | POA: Diagnosis not present

## 2016-02-02 DIAGNOSIS — M79605 Pain in left leg: Secondary | ICD-10-CM | POA: Diagnosis not present

## 2016-02-02 DIAGNOSIS — Z8673 Personal history of transient ischemic attack (TIA), and cerebral infarction without residual deficits: Secondary | ICD-10-CM | POA: Insufficient documentation

## 2016-02-02 DIAGNOSIS — Z79899 Other long term (current) drug therapy: Secondary | ICD-10-CM | POA: Insufficient documentation

## 2016-02-02 DIAGNOSIS — E10319 Type 1 diabetes mellitus with unspecified diabetic retinopathy without macular edema: Secondary | ICD-10-CM | POA: Insufficient documentation

## 2016-02-02 DIAGNOSIS — Z7982 Long term (current) use of aspirin: Secondary | ICD-10-CM | POA: Insufficient documentation

## 2016-02-02 DIAGNOSIS — R0602 Shortness of breath: Secondary | ICD-10-CM | POA: Diagnosis not present

## 2016-02-02 DIAGNOSIS — M7989 Other specified soft tissue disorders: Secondary | ICD-10-CM

## 2016-02-02 DIAGNOSIS — I82402 Acute embolism and thrombosis of unspecified deep veins of left lower extremity: Secondary | ICD-10-CM | POA: Diagnosis not present

## 2016-02-02 LAB — BASIC METABOLIC PANEL
Anion gap: 12 (ref 5–15)
BUN: 8 mg/dL (ref 6–20)
CO2: 27 mmol/L (ref 22–32)
Calcium: 10 mg/dL (ref 8.9–10.3)
Chloride: 102 mmol/L (ref 101–111)
Creatinine, Ser: 0.87 mg/dL (ref 0.44–1.00)
GFR calc Af Amer: >60 mL/min (ref >60)
GFR calc non Af Amer: >60 mL/min (ref >60)
Glucose, Bld: 94 mg/dL (ref 65–99)
Potassium: 3.7 mmol/L (ref 3.5–5.1)
Sodium: 141 mmol/L (ref 135–145)

## 2016-02-02 LAB — BRAIN NATRIURETIC PEPTIDE: B Natriuretic Peptide: 14.1 pg/mL (ref 0.0–100.0)

## 2016-02-02 LAB — CBC
HCT: 43.2 % (ref 36.0–46.0)
Hemoglobin: 14.9 g/dL (ref 12.0–15.0)
MCH: 32 pg (ref 26.0–34.0)
MCHC: 34.5 g/dL (ref 30.0–36.0)
MCV: 92.9 fL (ref 78.0–100.0)
Platelets: 443 10*3/uL — ABNORMAL HIGH (ref 150–400)
RBC: 4.65 MIL/uL (ref 3.87–5.11)
RDW: 12.2 % (ref 11.5–15.5)
WBC: 13.7 10*3/uL — ABNORMAL HIGH (ref 4.0–10.5)

## 2016-02-02 LAB — I-STAT TROPONIN, ED: Troponin i, poc: 0.01 ng/mL (ref 0.00–0.08)

## 2016-02-02 MED ORDER — RIVAROXABAN 15 MG PO TABS
15.0000 mg | ORAL_TABLET | Freq: Once | ORAL | Status: AC
  Administered 2016-02-02: 15 mg via ORAL
  Filled 2016-02-02: qty 1

## 2016-02-02 MED ORDER — DIPHENHYDRAMINE HCL 50 MG/ML IJ SOLN: 25.0000 mg | Freq: Once | INTRAMUSCULAR | Status: DC

## 2016-02-02 MED ORDER — IOPAMIDOL (ISOVUE-370) INJECTION 76%
INTRAVENOUS | Status: AC
  Filled 2016-02-02: qty 100

## 2016-02-02 MED ORDER — OXYCODONE-ACETAMINOPHEN 5-325 MG PO TABS
1.0000 | ORAL_TABLET | Freq: Once | ORAL | Status: AC
  Administered 2016-02-02: 1 via ORAL
  Filled 2016-02-02: qty 1

## 2016-02-02 MED ORDER — RIVAROXABAN (XARELTO) EDUCATION KIT FOR DVT/PE PATIENTS
PACK | Freq: Once | Status: AC
  Administered 2016-02-02
  Filled 2016-02-02: qty 1

## 2016-02-02 NOTE — ED Notes (Signed)
Pt still in vascular.

## 2016-02-02 NOTE — ED Provider Notes (Signed)
Patient received in sign out from [place] at shift change.  See her note for full H&P.  Briefly, 46 y.o. F here with left leg pain.  Recent foot surgery.  Hx of DVT in the past, not currently on anti-coagulation.  Also had some complaints of chest pain and SOB.    Plan:  Venous duplex does reveal proximal DVT of left leg.  Given her chest pain and SOB, will obtain labs and CTA of chest to assess for PE.  Results for orders placed or performed during the hospital encounter of [id number]  Basic metabolic panel  Result Value Ref Range   Sodium 141 135 - 145 mmol/L   Potassium 3.7 3.5 - 5.1 mmol/L   Chloride 102 101 - 111 mmol/L   CO2 27 22 - 32 mmol/L   Glucose, Bld 94 65 - 99 mg/dL   BUN 8 6 - 20 mg/dL   Creatinine, Ser 0.87 0.44 - 1.00 mg/dL   Calcium 10.0 8.9 - 10.3 mg/dL   GFR calc non Af Amer >60 >60 mL/min   GFR calc Af Amer >60 >60 mL/min   Anion gap 12 5 - 15  CBC  Result Value Ref Range   WBC 13.7 (H) 4.0 - 10.5 K/uL   RBC 4.65 3.87 - 5.11 MIL/uL   Hemoglobin 14.9 12.0 - 15.0 g/dL   HCT 43.2 36.0 - 46.0 %   MCV 92.9 78.0 - 100.0 fL   MCH 32.0 26.0 - 34.0 pg   MCHC 34.5 30.0 - 36.0 g/dL   RDW 12.2 11.5 - 15.5 %   Platelets 443 (H) 150 - 400 K/uL  Brain natriuretic peptide  Result Value Ref Range   B Natriuretic Peptide 14.1 0.0 - 100.0 pg/mL  I-stat troponin, ED  Result Value Ref Range   Troponin i, poc 0.01 0.00 - 0.08 ng/mL   Comment 3           Ct Angio Chest Pe W/cm &/or Wo Cm  Result Date: 02/02/2016 CLINICAL DATA:  PT W/ KNOW DVT SINCE SURGERY ON FOOT NOW HAVING CP AND SHOBCONCERN FOR PE NOWPT DIABETIC ON INSULIN EXAM: CT ANGIOGRAPHY CHEST WITH CONTRAST TECHNIQUE: Multidetector CT imaging of the chest was performed using the standard protocol during bolus administration of intravenous contrast. Multiplanar CT image reconstructions and MIPs were obtained to evaluate the vascular anatomy. CONTRAST:  100 mL of Isovue 370 intravenous contrast COMPARISON:  CTA, 11/19/2015  FINDINGS: Cardiovascular: Satisfactory opacification of the pulmonary arteries to the segmental level. No evidence of pulmonary embolism. Normal heart size. No pericardial effusion. No coronary artery calcifications. The thoracic aorta is normal in caliber. No dissection. No atherosclerotic changes. Mediastinum/Nodes: No enlarged mediastinal, hilar, or axillary lymph nodes. Thyroid gland, trachea, and esophagus demonstrate no significant findings. Lungs/Pleura: Stable 2 mm subpleural nodule on the right. Lungs are otherwise clear. No pleural effusion. No pneumothorax. Upper Abdomen: No acute abnormality. Musculoskeletal: Normal. Review of the MIP images confirms the above findings. IMPRESSION: 1. No evidence of a pulmonary embolus. 2. No acute findings.  Lungs essentially clear. Electronically Signed   By: Lajean Manes M.D.   On: 02/02/2016 21:18   VASCULAR LAB PRELIMINARY  PRELIMINARY  PRELIMINARY  PRELIMINARY  Left lower extremity venous duplex completed.    Preliminary report:  There is acute, non occlusive DVT noted in the left femoral and common femoral veins.  There is no propagation to the right lower extremity.   Called report to [employer], PA-C  KANADY, CANDACE,  RVT 02/02/2016, 4:44 PM  Labwork is reassuring. CTA without signs of PE. No evidence of heart strain.  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  Appreciate their input. Will start xarelto. Discussed bleeding risks with this and patient was instructed to watch for nosebleeds, blood in stool, bleeding gums, etc. Patient does not currently have a PCP as her former doctor has retired, I have provided her with information for some local offices.  Discussed plan with patient, she acknowledged understanding and agreed with plan of care.  Return precautions given for new or worsening symptoms.   Larene Pickett, PA-C 02/03/16  BB:[id number]    Gareth Morgan, MD 02/05/16 [phone]

## 2016-02-02 NOTE — Consult Note (Signed)
Hospital Consult    Reason for Consult:  Left leg pain Referring Physician:  ED MRN #:  [id number]  History of Present Illness: This is a 46 y.o. female with history of PE in 2016 and took anticoagulation for 3 months. Last year had left ankle fracture and in December underwent surgery and is now walking in boot. She presents to ED with left leg pain that is mostly in her popliteal foss area and is found to have proximal left leg dvt. She does have mild associated shortness of breath but no chest pain. Denies foot or ankle pain at this point. Pain does not radiate, described as ache and throb and is tender to palpation. Has not had pain like this before. Denies alleviating or exacerbating factors. Time frame is today only. Father with history of dvt associated with bilateral leg fractures. She had two children naturally but did lose at least 3 pregnancie unexpectedly. She is diabetic but denies other chronic medical illness. Other than recent surgery from which she has mostly healed, she does not have any recent travel, catheterization, trauma, cancer, hormonal therapy, infection or other immbolization to provoke dvt.   Past Medical History:  Diagnosis Date  . Abdominal pain   . Achilles tendon contracture, left    with lisfrabc fracture  . Anxiety   . Arthritis   . Biliary dyskinesia   . Cervical strain   . Closed head injury 2010  . Depression   . Elevated LFTs   . Fibromyalgia   . GERD (gastroesophageal reflux disease)   . History of kidney stones   . Hyperlipidemia   . Hypoglycemia   . Joint pain   . Major depressive disorder   . Migraine    hx of none recent  . Nausea & vomiting   . Pneumonia   . Post traumatic stress disorder (PTSD)   . Pulmonary embolism ([place])   . Renal stones   . Skin rash    due to medications  . Sleep trouble   . Stroke [facility])    " series of mini strokes around 2007"  . TIA (transient ischemic attack)   . Wears glasses   . 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 ABDOMINAL HYSTERECTOMY  2001  . APPENDECTOMY    . [place]  . CHOLECYSTECTOMY  02/10/2011   Procedure: LAPAROSCOPIC CHOLECYSTECTOMY WITH INTRAOPERATIVE CHOLANGIOGRAM;  Surgeon: Judieth Keens, DO;  Location: [facility];  Service: General;  Laterality: N/A;  . COLONOSCOPY    . DILATION AND CURETTAGE OF UTERUS    . exploratory laparomty  Mar 04, 2012  . EYE SURGERY  2006   laser eye surgery left eye  . KNEE SURGERY Left 2012  . LAPAROSCOPIC APPENDECTOMY N/A 10/12/2012   Procedure: DIAGNOSTIC APPENDECTOMY LAPAROSCOPIC;  Surgeon: Madilyn Hook, DO;  [facility];  Service: General;  Laterality: N/A;  . lipoma removed  yrs ago  . LIVER BIOPSY    . ORIF ANKLE FRACTURE Left 01/03/2016   Procedure: OPEN REDUCTION INTERNAL FIXATION (ORIF) LEFT LISFRANC JOINT, GASTROC RECESSION;  Surgeon: Newt Minion, MD;  Location: [place];  Service: Orthopedics;  Laterality: Left;  . ROBOTIC ASSISTED LAPAROSCOPIC LYSIS OF ADHESION N/A 03/04/2012   Procedure: ROBOTIC ASSISTED LAPAROSCOPIC LYSIS OF EXTENSIVE ADHESIONS;  Surgeon: Claiborne Billings A. Pamala Hurry, MD;  Location: [facility];  Service: Gynecology;  Laterality: N/A;  .  TONSILLECTOMY  2001    Allergies  Allergen Reactions  . Cephalexin Anaphylaxis, Shortness Of Breath, Rash and Other (See Comments)    Pt was admitted to the hospital upon taking.   . Ibuprofen Nausea And Vomiting and Rash  . Meloxicam Nausea Only    Prior to Admission medications   Medication Sig Start Date End Date Taking? Authorizing Provider  acetaminophen (TYLENOL) 500 MG tablet Take 1 tablet (500 mg total) by mouth every 8 (eight) hours as needed for headache. 11/20/15  Yes Mauricio Gerome Apley, MD  aspirin EC 325 MG tablet Take 325 mg by mouth at bedtime.    Yes Historical Provider, MD  atorvastatin (LIPITOR) 40 MG tablet Take 40 mg by mouth at bedtime.     Yes Historical Provider, MD  famotidine (PEPCID) 20 MG tablet Take 1 tablet (20 mg total) by mouth 2 (two) times daily. Patient taking differently: Take 20 mg by mouth 2 (two) times daily as needed for heartburn or indigestion.  12/30/15  Yes Christopher End, MD  gabapentin (NEURONTIN) 300 MG capsule Take 300 mg by mouth daily as needed (for pain).  12/11/15  Yes Historical Provider, MD  insulin aspart (NOVOLOG) 100 UNIT/ML injection Inject 8 Units into the skin 3 (three) times daily with meals. 11/20/15  Yes Mauricio Gerome Apley, MD  LANTUS 100 UNIT/ML injection Inject 28 Units into the skin at bedtime.  11/20/15  Yes Historical Provider, MD  lidocaine (LIDODERM) 5 % Place 1 patch onto the skin daily as needed (pain). Remove after 12 hours. 10/30/15  Yes Historical Provider, MD  Oxycodone HCl 10 MG TABS Take 10 mg by mouth every 6 (six) hours as needed for pain. 12/16/15  Yes Historical Provider, MD  oxyCODONE-acetaminophen (PERCOCET/ROXICET) 5-325 MG tablet Take 1 tablet by mouth every 8 (eight) hours as needed for severe pain. Patient not taking: Reported on 02/02/2016 01/06/16   Newt Minion, MD    Social History   Social History  . Marital status: Divorced    Spouse name: Choua Cloer  . Number of children: 2  . Years of education: 12+   Occupational History  . 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 Smoking status: Never Smoker  . Smokeless tobacco: Never Used  . Alcohol use No  . Drug use: No  . Sexual activity: Not on file   Other Topics Concern  . Not on file   Social History Narrative   Lives with husband and two sons.  Before going back to school, she was a Research scientist (physical sciences) at [employer].  Stop working when she was diagnosed with endometriosis and had a TIA.    Family History  Problem Relation Age of Onset  . Cancer Father     lymphoma  . Nephrolithiasis Father   . Vascular Disease Father   . Alcohol abuse Father   . Drug  abuse Father   . Cancer Maternal Grandmother     colon  . Hypertension Mother   . Heart disease Mother   . Cataracts Mother   . Depression Mother   . Diabetes Brother   . Drug abuse Brother   . Mental illness Maternal Grandfather   . Cancer Maternal Grandfather   . Heart disease Paternal Grandmother   . Heart disease Paternal Grandfather   . Suicidality Maternal Uncle     ROS: [x]  Positive   [ ]  Negative   [ ]  All sytems reviewed and are  negative  Cardiovascular: []  chest pain/pressure []  palpitations [x]  SOB  []  DOE []  pain in legs while walking []  pain in legs at rest []  pain in legs at night []  non-healing ulcers []  hx of DVT []  swelling in legs  Pulmonary: []  productive cough []  asthma/wheezing []  home O2  Neurologic: []  weakness in []  arms []  legs []  numbness in []  arms []  legs []  hx of CVA []  mini stroke [] difficulty speaking or slurred speech []  temporary loss of vision in one eye []  dizziness  Hematologic: []  hx of cancer []  bleeding problems []  problems with blood clotting easily  Endocrine:   []  diabetes []  thyroid disease  GI []  vomiting blood []  blood in stool  GU: []  CKD/renal failure []  HD--[]  M/W/F or []  T/T/S []  burning with urination []  blood in urine  Psychiatric: []  anxiety []  depression  Musculoskeletal: []  arthritis []  joint pain [x]  leg pain on left  Integumentary: []  rashes []  ulcers  Constitutional: []  fever []  chills   Physical Examination  Vitals:   02/02/16 2015 02/02/16 2045  BP: 132/100 131/73  Pulse: 91 94  Resp: 17 15  Temp:     Body mass index is 29.18 kg/m.  General:  WDWN in NAD Gait: Not observed HENT: WNL, normocephalic Pulmonary: normal non-labored breathing Cardiac: palpable femoral and popliteal pulses bilateral Abdomen: soft, NT/ND, no masses Skin: without rashes Extremities: no edema ble, ttp left popliteal fossa Musculoskeletal: no muscle wasting or atrophy  Neurologic: A&O X 3;  Appropriate Affect ; SENSATION: normal; MOTOR FUNCTION:  moving all extremities equally. Speech is fluent/normal   CBC    Component Value Date/Time   WBC 13.7 (H) 02/02/2016 1843   RBC 4.65 02/02/2016 1843   HGB 14.9 02/02/2016 1843   HGB 14.2 05/16/2008 1122   HCT 43.2 02/02/2016 1843   HCT 42.4 05/16/2008 1122   PLT 443 (H) 02/02/2016 1843   PLT 503 (H) 05/16/2008 1122   MCV 92.9 02/02/2016 1843   MCV 94.7 01/17/2015 1404   MCV 97 05/16/2008 1122   MCH 32.0 02/02/2016 1843   MCHC 34.5 02/02/2016 1843   RDW 12.2 02/02/2016 1843   RDW 11.5 05/16/2008 1122   LYMPHSABS 2.4 11/19/2015 1150   LYMPHSABS 2.6 05/16/2008 1122   MONOABS 0.6 11/19/2015 1150   EOSABS 0.1 11/19/2015 1150   EOSABS 0.3 05/16/2008 1122   BASOSABS 0.0 11/19/2015 1150   BASOSABS 0.1 05/16/2008 1122    BMET    Component Value Date/Time   NA 141 02/02/2016 1843   K 3.7 02/02/2016 1843   CL 102 02/02/2016 1843   CO2 27 02/02/2016 1843   GLUCOSE 94 02/02/2016 1843   BUN 8 02/02/2016 1843   CREATININE 0.87 02/02/2016 1843   CREATININE 1.01 02/23/2015 1344   CALCIUM 10.0 02/02/2016 1843   GFRNONAA >60 02/02/2016 1843   GFRNONAA 69 02/23/2015 1344   GFRAA >60 02/02/2016 1843   GFRAA 79 02/23/2015 1344    COAGS: Lab Results  Component Value Date   INR 0.92 11/19/2015   INR 1.67 (H) 10/24/2014   INR 1.86 (H) 10/11/2014     CT angio IMPRESSION: 1. No evidence of a pulmonary embolus. 2. No acute findings.  Lungs essentially clear.  LE duplex Summary: Findings consistent with acute, non occluisve deep vein thrombosis involving the common femoral and proximal femoral veins of the left lower extremity.  ASSESSMENT/PLAN: This is a 46 y.o. female  With proximal dvt without significant leg symptoms other than pain  and no pe on CTA. No real provoking factor other than ankle surgery but she is still walking with a boot. At this point merits anticoagulation indefinitely and likely merits hematologic  workup prior to discontinuation of blood thinners. DVT does not merit inpatient admission, can f/u in office if she develops leg pain, swelling, varicose veins or progression of dvt.   Etosha Wetherell C. Donzetta Matters, MD [facility] Office: [phone] Pager: [phone]

## 2016-02-02 NOTE — ED Notes (Signed)
On way to vascular

## 2016-02-02 NOTE — ED Notes (Signed)
Patient currently in CT °

## 2016-02-02 NOTE — ED Provider Notes (Signed)
[facility] DEPT Provider Note   CSN: [id number] Arrival date & time: 02/02/16  1051     History   Chief Complaint Chief Complaint  Patient presents with  . Leg Pain    HPI Maureen [name] is a 46 y.o. female who presents with left calf pain. PMH significant for hx of DVT/PE not on anticoagulation, and recent ORIF of her left foot due to Lisfranc dislocation. She states that she had surgery on Dec 15 by Dr. Sharol Given. The pain in her foot has been getting better. Over the past week she has been having dull, achy pain of her calf which is tender to palpation. She took a Tylenol and flexeril with no relief. She also reports a achy central chest pain which started today and mild SOB. She states she had chest pain not too long ago and was evaluated by cardiology and they thought it was GI related and she would take an antacid and it would go away but this pain has not gone away with antacids. She also reports dizziness. She did not require life long anticoagulation after her DVT/PE several years ago. No numbness or weakness. She is currently supposed to be non-weight bearing.  HPI  Past Medical History:  Diagnosis Date  . Abdominal pain   . Achilles tendon contracture, left    with lisfrabc fracture  . Anxiety   . Arthritis   . Biliary dyskinesia   . Cervical strain   . Closed head injury 2010  . Depression   . Elevated LFTs   . Fibromyalgia   . GERD (gastroesophageal reflux disease)   . History of kidney stones   . Hyperlipidemia   . Hypoglycemia   . Joint pain   . Major depressive disorder   . Migraine    hx of none recent  . Nausea & vomiting   . Pneumonia   . Post traumatic stress disorder (PTSD)   . Pulmonary embolism ([place])   . Renal stones   . Skin rash    due to medications  . Sleep trouble   . Stroke [facility])    " series of mini strokes around 2007"  . TIA (transient ischemic attack)   . Wears glasses   . Well controlled type 1 diabetes mellitus with peripheral  neuropathy ([place])    Type 1    Patient Active Problem List   Diagnosis Date Noted  . Lisfranc dislocation, left, sequela 12/16/2015  . Short Achilles tendon (acquired), left ankle 12/16/2015  . Chest pain with moderate risk for cardiac etiology 11/29/2015  . Elevated troponin 11/19/2015  . Migraine headache 11/19/2015  . Neck pain 11/19/2015  . Acute kidney injury ([place]) 02/19/2015  . DKA (diabetic ketoacidoses) ([place]) 10/24/2014  . HLD (hyperlipidemia) 10/24/2014  . Abnormal LFTs 09/03/2014  . Dizziness 09/03/2014  . Acute pulmonary embolism ([place]) 09/03/2014  . Transaminitis 09/02/2014  . GAD (generalized anxiety disorder) 11/23/2013  . PTSD (post-traumatic stress disorder) 11/23/2013  . Severe major depression without psychotic features ([place]) 10/10/2013  . Endometriosis 12/08/2011  . Arthritis of left knee 12/08/2011  . Diabetic retinopathy ([place]) 12/08/2011  . Fibromyalgia 10/23/2011  . DM type 1 (diabetes mellitus, type 1) ([place]) 10/13/2011    Past Surgical History:  Procedure Laterality Date  . ABDOMINAL HYSTERECTOMY  2001  . APPENDECTOMY    . [place]  . CHOLECYSTECTOMY  02/10/2011   Procedure: LAPAROSCOPIC CHOLECYSTECTOMY WITH INTRAOPERATIVE CHOLANGIOGRAM;  Surgeon: Judieth Keens, DO;  Location: [facility];  Service: General;  Laterality: N/A;  . COLONOSCOPY    . DILATION AND CURETTAGE OF UTERUS    . exploratory laparomty  Mar 04, 2012  . EYE SURGERY  2006   laser eye surgery left eye  . KNEE SURGERY Left 2012  . LAPAROSCOPIC APPENDECTOMY N/A 10/12/2012   Procedure: DIAGNOSTIC APPENDECTOMY LAPAROSCOPIC;  Surgeon: Madilyn Hook, DO;  [facility];  Service: General;  Laterality: N/A;  . lipoma removed  yrs ago  . LIVER BIOPSY    . ORIF ANKLE FRACTURE Left 01/03/2016   Procedure: OPEN REDUCTION INTERNAL FIXATION (ORIF) LEFT LISFRANC JOINT, GASTROC RECESSION;  Surgeon: Newt Minion, MD;  Location: [facility];  Service: Orthopedics;  Laterality: Left;  .  ROBOTIC ASSISTED LAPAROSCOPIC LYSIS OF ADHESION N/A 03/04/2012   Procedure: ROBOTIC ASSISTED LAPAROSCOPIC LYSIS OF EXTENSIVE ADHESIONS;  Surgeon: Claiborne Billings A. Pamala Hurry, MD;  Location: [facility];  Service: Gynecology;  Laterality: N/A;  . TONSILLECTOMY  2001    OB History    No data available       Home Medications    Prior to Admission medications   Medication Sig Start Date End Date Taking? Authorizing Provider  acetaminophen (TYLENOL) 500 MG tablet Take 1 tablet (500 mg total) by mouth every 8 (eight) hours as needed for headache. 11/20/15  Yes Mauricio Gerome Apley, MD  aspirin EC 325 MG tablet Take 325 mg by mouth at bedtime.    Yes Historical Provider, MD  atorvastatin (LIPITOR) 40 MG tablet Take 40 mg by mouth at bedtime.    Yes Historical Provider, MD  famotidine (PEPCID) 20 MG tablet Take 1 tablet (20 mg total) by mouth 2 (two) times daily. Patient taking differently: Take 20 mg by mouth 2 (two) times daily as needed for heartburn or indigestion.  12/30/15  Yes Christopher End, MD  gabapentin (NEURONTIN) 300 MG capsule Take 300 mg by mouth daily as needed (for pain).  12/11/15  Yes Historical Provider, MD  insulin aspart (NOVOLOG) 100 UNIT/ML injection Inject 8 Units into the skin 3 (three) times daily with meals. 11/20/15  Yes Mauricio Gerome Apley, MD  LANTUS 100 UNIT/ML injection Inject 28 Units into the skin at bedtime.  11/20/15  Yes Historical Provider, MD  lidocaine (LIDODERM) 5 % Place 1 patch onto the skin daily as needed (pain). Remove after 12 hours. 10/30/15  Yes Historical Provider, MD  Oxycodone HCl 10 MG TABS Take 10 mg by mouth every 6 (six) hours as needed for pain. 12/16/15  Yes Historical Provider, MD  oxyCODONE-acetaminophen (PERCOCET/ROXICET) 5-325 MG tablet Take 1 tablet by mouth every 8 (eight) hours as needed for severe pain. Patient not taking: Reported on 02/02/2016 01/06/16   Newt Minion, MD    Family History Family History  Problem Relation Age of Onset  .  Cancer Father     lymphoma  . Nephrolithiasis Father   . Vascular Disease Father   . Alcohol abuse Father   . Drug abuse Father   . Cancer Maternal Grandmother     colon  . Hypertension Mother   . Heart disease Mother   . Cataracts Mother   . Depression Mother   . Diabetes Brother   . Drug abuse Brother   . Mental illness Maternal Grandfather   . Cancer Maternal Grandfather   . Heart disease Paternal Grandmother   . Heart disease Paternal Grandfather   . Suicidality Maternal Uncle     Social History Social History  Substance Use Topics  . Smoking  status: Never Smoker  . Smokeless tobacco: Never Used  . Alcohol use No     Allergies   Cephalexin; Ibuprofen; and Meloxicam   Review of Systems Review of Systems  Respiratory: Positive for shortness of breath.   Cardiovascular: Positive for chest pain.  Musculoskeletal: Positive for myalgias (calf pain). Negative for arthralgias and joint swelling.  Neurological: Negative for numbness.  All other systems reviewed and are negative.    Physical Exam Updated Vital Signs BP 120/84   Pulse 95   Temp 98.8 F (37.1 C)   Resp 18   Ht 5\' 4"  (1.626 m)   Wt 77.1 kg   SpO2 100%   BMI 29.18 kg/m   Physical Exam  Constitutional: She is oriented to person, place, and time. She appears well-developed and well-nourished. No distress.  HENT:  Head: Normocephalic and atraumatic.  Eyes: Conjunctivae are normal. Pupils are equal, round, and reactive to light. Right eye exhibits no discharge. Left eye exhibits no discharge. No scleral icterus.  Neck: Normal range of motion.  Cardiovascular: Normal rate and regular rhythm.  Exam reveals no gallop and no friction rub.   No murmur heard. Pulmonary/Chest: Effort normal and breath sounds normal. No respiratory distress. She has no wheezes. She has no rales. She exhibits tenderness.  Chest is mildly tender  Abdominal: She exhibits no distension.  Musculoskeletal:  Tenderness of left  calf. FROM of knee and ankle. Surgical site is non-tender, non-erythematous. No appreciable swelling  Neurological: She is alert and oriented to person, place, and time.  Skin: Skin is warm and dry.  Psychiatric: She has a normal mood and affect. Her behavior is normal.  Nursing note and vitals reviewed.    ED Treatments / Results  Labs (all labs ordered are listed, but only abnormal results are displayed) [place], ED    EKG  EKG Interpretation None       Radiology No results found.  Procedures Procedures (including critical care time)  Medications Ordered in ED Medications - No data to display   Initial Impression / Assessment and Plan / ED Course  I have reviewed the triage vital signs and the nursing notes.  Pertinent labs & imaging results that were available during my care of the patient were reviewed by me and considered in my medical decision making (see chart for details).  Clinical Course    46 year old female present with non-occlusive DVT of the left femoral and left common femoral veins. Patient is afebrile, not tachycardic or tachypneic, normotensive, and not hypoxic. She is complaining of chest pain and dyspnea with dizziness. She is not tachycardic and lungs are clear.  Discussed with Dr. Jacelyn Grip. Will initiate work up for PE. Will sign patient out at shift change to [employer]. Labs, EKG, IV, CTA ordered.   Final Clinical Impressions(s) / ED Diagnoses   Final diagnoses:  Acute deep vein thrombosis (DVT) of femoral vein of left lower extremity [facility])    New Prescriptions New Prescriptions   No medications on file     Recardo Evangelist, PA-C 02/02/16 [place], MD 02/04/16 [phone]

## 2016-02-02 NOTE — ED Triage Notes (Addendum)
Pt c/o left leg pain onset last week. Pt has tried tylenol and flexeril without relief. Pt has walking boot one left leg due to foot surgery on the 15th of December. Feels similar to when she had blood clots in the past.

## 2016-02-02 NOTE — Progress Notes (Signed)
VASCULAR LAB PRELIMINARY  PRELIMINARY  PRELIMINARY  PRELIMINARY  Left lower extremity venous duplex completed.    Preliminary report:  There is acute, non occlusive DVT noted in the left femoral and common femoral veins.  There is no propagation to the right lower extremity.   Called report to Janetta Hora, PA-C  Olive Motyka, RVT 02/02/2016, 4:44 PM

## 2016-02-02 NOTE — ED Notes (Signed)
Patient having pain in left leg.

## 2016-02-03 ENCOUNTER — Telehealth: Payer: Self-pay | Admitting: *Deleted

## 2016-02-03 MED ORDER — RIVAROXABAN (XARELTO) VTE STARTER PACK (15 & 20 MG): ORAL_TABLET | ORAL | 0 refills | Status: DC

## 2016-02-03 NOTE — Discharge Instructions (Signed)
Take the prescribed medication as directed.  This will thin your blood so make sure to watch for any blood in the stool, nosebleeds, bleeding gums, weakness, etc. Follow-up with a primary care doctor in the area-- I have given you some contact information but there are several other offices in the area as well.  You may also call your insurance company to see who locally accepts your insurance. Return to the ED for new or worsening symptoms.

## 2016-02-03 NOTE — Telephone Encounter (Signed)
Pt called stating her pharmacy was did not have Rx written in stock; states her pharmacy called around and also could not find Rx in stock and it would take a day or so to come in after ordering.  EDCM contacted [facility] [address] to inquire if they have Rx in stock, Pharm D confirmed.  Information relayed to pt; pt will pick up Rx promptly.

## 2016-02-07 ENCOUNTER — Ambulatory Visit (INDEPENDENT_AMBULATORY_CARE_PROVIDER_SITE_OTHER): Payer: BLUE CROSS/BLUE SHIELD | Admitting: Orthopedic Surgery

## 2016-02-10 ENCOUNTER — Ambulatory Visit (INDEPENDENT_AMBULATORY_CARE_PROVIDER_SITE_OTHER): Payer: Medicare PPO | Admitting: Orthopedic Surgery

## 2016-02-10 VITALS — Ht 64.0 in | Wt 170.0 lb

## 2016-02-10 DIAGNOSIS — E1142 Type 2 diabetes mellitus with diabetic polyneuropathy: Secondary | ICD-10-CM

## 2016-02-10 DIAGNOSIS — S93325S Dislocation of tarsometatarsal joint of left foot, sequela: Secondary | ICD-10-CM

## 2016-02-10 DIAGNOSIS — I82412 Acute embolism and thrombosis of left femoral vein: Secondary | ICD-10-CM | POA: Insufficient documentation

## 2016-02-10 NOTE — Progress Notes (Signed)
Office Visit Note   Patient: Maureen [name]           Date of Birth: 10-19-1970           MRN: [id number] Visit Date: 02/10/2016              Requested by: No referring provider defined for this encounter. PCP: Pcp Not In System  Chief Complaint  Patient presents with  . Left Foot - Follow-up    left foot ORIF Lisfranc joint with gastrocnemius recession on 01/03/16.     HPI: Pt is s/p a left foot ORIF Lisfranc joint with gastrocnemius recession on 01/03/16. Pt also positive for a DVT doppler on 02/02/16. Patient has a previous history of PE years ago with no known etiology. She is now on a Xarelto starter pack 15 mg BID and will decreased to 20 mg daily. She is also being referred to a hematologist for further eval of reason of clot. Pamella Pert, RMA    Patient is a follow-up appointment with her primary care physician and will be referred to hematology. She also states she's going to fly in March and recommended the medical compression socks for flying as well as continuing her Xarelto. Assessment & Plan: Visit Diagnoses:  1. Acute deep vein thrombosis (DVT) of femoral vein of left lower extremity (HCC)   2. Diabetic polyneuropathy associated with type 2 diabetes mellitus ([place])   3. Lisfranc dislocation, left, sequela     Plan: Weightbearing as tolerated with the fracture boot. Continue Xarelto, medical compression stockings to be worn at all times, follow-up in 4 weeks with 3 view radiographs of the left foot at follow-up.  Follow-Up Instructions: Return in about 4 weeks (around 03/09/2016).   Ortho Exam On examination patient is alert oriented no adenopathy well-dressed normal affect numerous to wear for her calf is soft nontender to palpation. The calf incision is nontender she has good dorsiflexion the ankle. The Lisfranc incision has healed nicely there is no cellulitis no swelling.  Imaging: No results found.  Orders:  No orders of the defined types were  placed in this encounter.  No orders of the defined types were placed in this encounter.    Procedures: No procedures performed  Clinical Data: No additional findings.  Subjective: Review of Systems  Objective: Vital Signs: Ht 5\' 4"  (1.626 m)   Wt 170 lb (77.1 kg)   BMI 29.18 kg/m   Specialty Comments:  No specialty comments available.  PMFS History: Patient Active Problem List   Diagnosis Date Noted  . Acute deep vein thrombosis (DVT) of femoral vein of left lower extremity ([place]) 02/10/2016  . Diabetic polyneuropathy associated with type 2 diabetes mellitus ([place]) 02/10/2016  . Lisfranc dislocation, left, sequela 12/16/2015  . Short Achilles tendon (acquired), left ankle 12/16/2015  . Chest pain with moderate risk for cardiac etiology 11/29/2015  . Elevated troponin 11/19/2015  . Migraine headache 11/19/2015  . Neck pain 11/19/2015  . Acute kidney injury ([place]) 02/19/2015  . DKA (diabetic ketoacidoses) ([place]) 10/24/2014  . HLD (hyperlipidemia) 10/24/2014  . Abnormal LFTs 09/03/2014  . Dizziness 09/03/2014  . Acute pulmonary embolism ([place]) 09/03/2014  . Transaminitis 09/02/2014  . GAD (generalized anxiety disorder) 11/23/2013  . PTSD (post-traumatic stress disorder) 11/23/2013  . Severe major depression without psychotic features ([place]) 10/10/2013  . Endometriosis 12/08/2011  . Arthritis of left knee 12/08/2011  . Diabetic retinopathy ([place]) 12/08/2011  . Fibromyalgia 10/23/2011  . DM type 1 (diabetes mellitus,  type 1) ([place]) 10/13/2011   Past Medical History:  Diagnosis Date  . Abdominal pain   . Achilles tendon contracture, left    with lisfrabc fracture  . Anxiety   . Arthritis   . Biliary dyskinesia   . Cervical strain   . Closed head injury 2010  . Depression   . Elevated LFTs   . Fibromyalgia   . GERD (gastroesophageal reflux disease)   . History of kidney stones   . Hyperlipidemia   . Hypoglycemia   . Joint pain   . Major depressive disorder   .  Migraine    hx of none recent  . Nausea & vomiting   . Pneumonia   . Post traumatic stress disorder (PTSD)   . Pulmonary embolism ([place])   . Renal stones   . Skin rash    due to medications  . Sleep trouble   . Stroke [facility])    " series of mini strokes around 2007"  . TIA (transient ischemic attack)   . Wears glasses   . 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 Cancer Father     lymphoma  . Nephrolithiasis Father   . Vascular Disease Father   . Alcohol abuse Father   . Drug abuse Father   . Cancer Maternal Grandmother     colon  . Hypertension Mother   . Heart disease Mother   . Cataracts Mother   . Depression Mother   . Diabetes Brother   . Drug abuse Brother   . Mental illness Maternal Grandfather   . Cancer Maternal Grandfather   . Heart disease Paternal Grandmother   . Heart disease Paternal Grandfather   . Suicidality Maternal Uncle     Past Surgical History:  Procedure Laterality Date  . ABDOMINAL HYSTERECTOMY  2001  . APPENDECTOMY    . [place]  . CHOLECYSTECTOMY  02/10/2011   Procedure: LAPAROSCOPIC CHOLECYSTECTOMY WITH INTRAOPERATIVE CHOLANGIOGRAM;  Surgeon: Judieth Keens, DO;  Location: [facility];  Service: General;  Laterality: N/A;  . COLONOSCOPY    . DILATION AND CURETTAGE OF UTERUS    . exploratory laparomty  Mar 04, 2012  . EYE SURGERY  2006   laser eye surgery left eye  . KNEE SURGERY Left 2012  . LAPAROSCOPIC APPENDECTOMY N/A 10/12/2012   Procedure: DIAGNOSTIC APPENDECTOMY LAPAROSCOPIC;  Surgeon: Madilyn Hook, DO;  Location: [facility];  Service: General;  Laterality: N/A;  . lipoma removed  yrs ago  . LIVER BIOPSY    . ORIF ANKLE FRACTURE Left 01/03/2016   Procedure: OPEN REDUCTION INTERNAL FIXATION (ORIF) LEFT LISFRANC JOINT, GASTROC RECESSION;  Surgeon: Newt Minion, MD;  Location: [facility];  Service: Orthopedics;  Laterality: Left;  .  ROBOTIC ASSISTED LAPAROSCOPIC LYSIS OF ADHESION N/A 03/04/2012   Procedure: ROBOTIC ASSISTED LAPAROSCOPIC LYSIS OF EXTENSIVE ADHESIONS;  Surgeon: Claiborne Billings A. Pamala Hurry, MD;  Location: [facility];  Service: Gynecology;  Laterality: N/A;  . 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 Smoking status: Never Smoker  . Smokeless tobacco: Never Used  . Alcohol use No  . Drug use: No  . Sexual activity: Not on file

## 2016-02-19 ENCOUNTER — Ambulatory Visit (HOSPITAL_BASED_OUTPATIENT_CLINIC_OR_DEPARTMENT_OTHER): Payer: Medicare PPO | Attending: Internal Medicine | Admitting: Cardiology

## 2016-02-19 VITALS — Ht 64.0 in | Wt 174.0 lb

## 2016-02-19 DIAGNOSIS — R0683 Snoring: Secondary | ICD-10-CM

## 2016-02-19 DIAGNOSIS — R0602 Shortness of breath: Secondary | ICD-10-CM

## 2016-02-19 DIAGNOSIS — G4733 Obstructive sleep apnea (adult) (pediatric): Secondary | ICD-10-CM | POA: Diagnosis not present

## 2016-02-22 NOTE — Procedures (Signed)
Patient Name: Maureen [name], Maureen [name] Date: 02/19/2016 Gender: Female D.O.B: November 17, 1970 Age (years): 67 Referring Provider: Nelva Bush MD Height (inches): 64 Interpreting Physician: Fransico Him MD, ABSM Weight (lbs): 174 RPSGT: Laren Everts BMI: 30 MRN: [id number] Neck Size: 15.00  CLINICAL INFORMATION Sleep Study Type: Split Night CPAP  Indication for sleep study: Diabetes, Fatigue, Morning Headaches, Sleep walking/talking/parasomnias, Snoring, Witnessed Apneas  Epworth Sleepiness Score: 5  SLEEP STUDY TECHNIQUE As per the AASM Manual for the Scoring of Sleep and Associated Events v2.3 (April 2016) with a hypopnea requiring 4% desaturations.  The channels recorded and monitored were frontal, central and occipital EEG, electrooculogram (EOG), submentalis EMG (chin), nasal and oral airflow, thoracic and abdominal wall motion, anterior tibialis EMG, snore microphone, electrocardiogram, and pulse oximetry. Continuous positive airway pressure (CPAP) was initiated when the patient met split night criteria and was titrated according to treat sleep-disordered breathing.  MEDICATIONS Medications self-administered by patient taken the night of the study : XARELTO, ATORVASTATIN, OXYCODONE HCL  RESPIRATORY PARAMETERS  Diagnostic Total AHI (/hr): 28.6  RDI (/hr):35.1  OA Index (/hr): 1  CA Index (/hr):14.8 REM AHI (/hr): 55.4  NREM AHI (/hr):25.4  Supine AHI (/hr):45.8  Non-supine AHI (/hr):18.70 Min O2 Sat (%):85.00  Mean O2 (%):94.86  Time below 88% (min):1.1      Titration Optimal Pressure (cm):19  AHI at Optimal Pressure (/hr):0  Min O2 at Optimal Pressure (%):98.0 Supine % at Optimal (%):100  Sleep % at Optimal (%):97      SLEEP ARCHITECTURE The recording time for the entire night was 423.3 minutes.  During a baseline period of 164.2 minutes, the patient slept for 121.5 minutes in REM and nonREM, yielding a sleep efficiency of 74.0%. Sleep onset after  lights out was 6.5 minutes with a REM latency of 49.0 minutes. The patient spent 6.58% of the night in stage N1 sleep, 82.72% in stage N2 sleep, 0.00% in stage N3 and 10.70% in REM.  During the titration period of 249.5 minutes, the patient slept for 161.0 minutes in REM and nonREM, yielding a sleep efficiency of 64.5%. Sleep onset after CPAP initiation was 56.6 minutes with a REM latency of 45.0 minutes. The patient spent 3.11% of the night in stage N1 sleep, 57.14% in stage N2 sleep, 0.00% in stage N3 and 39.75% in REM.  CARDIAC DATA The 2 lead EKG demonstrated sinus rhythm. The mean heart rate was 77.31 beats per minute. Other EKG findings include: None.  LEG MOVEMENT DATA The total Periodic Limb Movements of Sleep (PLMS) were 0. The PLMS index was 0.00 .  IMPRESSIONS - Moderate obstructive sleep apnea occurred during the diagnostic portion of the study(AHI = 28.6/hour). An optimal PAP pressure was selected for this patient ( 18 cm of water) - Mild central sleep apnea occurred during the diagnostic portion of the study (CAI = 14.8/hour). - Moderate oxygen desaturation was noted during the diagnostic portion of the study (Min O2 =85.00%). - The patient snored with Moderate snoring volume during the diagnostic portion of the study. - No cardiac abnormalities were noted during this study. - Clinically significant periodic limb movements did not occur during sleep.  DIAGNOSIS - Obstructive Sleep Apnea (327.23 [G47.33 ICD-10])  RECOMMENDATIONS - Trial of CPAP therapy on 19 cm H2O with a Small size Resmed Full Face Mask AirFit F20 mask and heated humidification. - Avoid alcohol, sedatives and other CNS depressants that may worsen sleep apnea and disrupt normal sleep architecture. - Sleep hygiene should be reviewed to assess factors  that may improve sleep quality. - Weight management and regular exercise should be initiated or continued. - Return to Sleep Center for re-evaluation after 10 weeks  of therapy  [place], [place] [facility]  ELECTRONICALLY SIGNED ON:  02/22/2016, 3:29 PM [place] PH: [phone]   FX: [phone] [place]

## 2016-02-25 ENCOUNTER — Telehealth: Payer: Self-pay | Admitting: *Deleted

## 2016-02-25 NOTE — Telephone Encounter (Signed)
-----   Message from Maureen Margarita, MD sent at 02/22/2016  3:35 PM EST ----- Please let patient know that they have significant sleep apnea and had successful CPAP titration and will be set up with CPAP unit.  Please let DME know that order is in EPIC.  Please set patient up for OV in 10 weeks

## 2016-02-25 NOTE — Telephone Encounter (Signed)
Called the patient and left a message on vm to return my call

## 2016-02-26 ENCOUNTER — Other Ambulatory Visit: Payer: Self-pay | Admitting: *Deleted

## 2016-02-26 ENCOUNTER — Telehealth: Payer: Self-pay | Admitting: *Deleted

## 2016-02-26 NOTE — Telephone Encounter (Signed)
Called the patient and gave her the results of her sleep study, she verbalized understanding and agreed stating she felt an immediate difference once the mask was applied

## 2016-02-26 NOTE — Telephone Encounter (Signed)
-----   Message from Sueanne Margarita, MD sent at 02/22/2016  3:35 PM EST ----- Please let patient know that they have significant sleep apnea and had successful CPAP titration and will be set up with CPAP unit.  Please let DME know that order is in EPIC.  Please set patient up for OV in 10 weeks

## 2016-02-27 NOTE — Progress Notes (Signed)
Please let Maureen [name] know that her sleep study demonstrated moderate obstructive sleep apnea.  Please refer her to a sleep specialist (Drs. Lurene Shadow or pulmonary) to review these findings further and ensure that she receives the proper CPAP equipment/follow-up.  Thanks.

## 2016-03-02 ENCOUNTER — Emergency Department (HOSPITAL_COMMUNITY)
Admission: EM | Admit: 2016-03-02 | Discharge: 2016-03-03 | Disposition: A | Discharge status: Home / Self Care | Payer: Medicare PPO | Attending: Emergency Medicine | Admitting: Emergency Medicine

## 2016-03-02 ENCOUNTER — Emergency Department (HOSPITAL_COMMUNITY): Payer: Medicare PPO

## 2016-03-02 ENCOUNTER — Encounter (HOSPITAL_COMMUNITY): Payer: Self-pay | Admitting: Emergency Medicine

## 2016-03-02 DIAGNOSIS — Z8673 Personal history of transient ischemic attack (TIA), and cerebral infarction without residual deficits: Secondary | ICD-10-CM | POA: Insufficient documentation

## 2016-03-02 DIAGNOSIS — Z9114 Patient's other noncompliance with medication regimen: Secondary | ICD-10-CM | POA: Insufficient documentation

## 2016-03-02 DIAGNOSIS — E1065 Type 1 diabetes mellitus with hyperglycemia: Secondary | ICD-10-CM | POA: Insufficient documentation

## 2016-03-02 DIAGNOSIS — E1040 Type 1 diabetes mellitus with diabetic neuropathy, unspecified: Secondary | ICD-10-CM | POA: Insufficient documentation

## 2016-03-02 DIAGNOSIS — R52 Pain, unspecified: Secondary | ICD-10-CM

## 2016-03-02 DIAGNOSIS — Z79899 Other long term (current) drug therapy: Secondary | ICD-10-CM | POA: Insufficient documentation

## 2016-03-02 DIAGNOSIS — R0789 Other chest pain: Secondary | ICD-10-CM | POA: Insufficient documentation

## 2016-03-02 DIAGNOSIS — Z7901 Long term (current) use of anticoagulants: Secondary | ICD-10-CM | POA: Diagnosis not present

## 2016-03-02 DIAGNOSIS — R739 Hyperglycemia, unspecified: Secondary | ICD-10-CM

## 2016-03-02 LAB — I-STAT CHEM 8, ED
BUN: 14 mg/dL (ref 6–20)
Calcium, Ion: 1.12 mmol/L — ABNORMAL LOW (ref 1.15–1.40)
Chloride: 101 mmol/L (ref 101–111)
Creatinine, Ser: 0.8 mg/dL (ref 0.44–1.00)
Glucose, Bld: 315 mg/dL — ABNORMAL HIGH (ref 65–99)
HCT: 40 % (ref 36.0–46.0)
Hemoglobin: 13.6 g/dL (ref 12.0–15.0)
Potassium: 4.1 mmol/L (ref 3.5–5.1)
Sodium: 134 mmol/L — ABNORMAL LOW (ref 135–145)
TCO2: 24 mmol/L (ref 0–100)

## 2016-03-02 LAB — I-STAT TROPONIN, ED: Troponin i, poc: 0 ng/mL (ref 0.00–0.08)

## 2016-03-02 LAB — CBG MONITORING, ED
Glucose-Capillary: 408 mg/dL — ABNORMAL HIGH (ref 65–99)
Glucose-Capillary: 371 mg/dL — ABNORMAL HIGH (ref 65–99)

## 2016-03-02 LAB — BASIC METABOLIC PANEL
Anion gap: 12 (ref 5–15)
BUN: 14 mg/dL (ref 6–20)
CO2: 23 mmol/L (ref 22–32)
Calcium: 9.1 mg/dL (ref 8.9–10.3)
Chloride: 99 mmol/L — ABNORMAL LOW (ref 101–111)
Creatinine, Ser: 1.09 mg/dL — ABNORMAL HIGH (ref 0.44–1.00)
GFR calc Af Amer: >60 mL/min (ref >60)
GFR calc non Af Amer: >60 mL/min (ref >60)
Glucose, Bld: 457 mg/dL — ABNORMAL HIGH (ref 65–99)
Potassium: 4.6 mmol/L (ref 3.5–5.1)
Sodium: 134 mmol/L — ABNORMAL LOW (ref 135–145)

## 2016-03-02 MED ORDER — MORPHINE SULFATE (PF) 4 MG/ML IV SOLN
4.0000 mg | Freq: Once | INTRAVENOUS | Status: AC
  Administered 2016-03-02: 4 mg via INTRAVENOUS
  Filled 2016-03-02: qty 1

## 2016-03-02 MED ORDER — SODIUM CHLORIDE 0.9 % IV BOLUS (SEPSIS)
1000.0000 mL | Freq: Once | INTRAVENOUS | Status: AC
  Administered 2016-03-02: 1000 mL via INTRAVENOUS

## 2016-03-02 MED ORDER — SODIUM CHLORIDE 0.9 % IV BOLUS (SEPSIS)
1000.0000 mL | Freq: Once | INTRAVENOUS | Status: AC
  Administered 2016-03-03: 1000 mL via INTRAVENOUS

## 2016-03-02 MED ORDER — IOPAMIDOL (ISOVUE-370) INJECTION 76%
INTRAVENOUS | Status: AC
  Filled 2016-03-02: qty 100

## 2016-03-02 MED ORDER — METOCLOPRAMIDE HCL 5 MG/ML IJ SOLN
10.0000 mg | Freq: Once | INTRAMUSCULAR | Status: AC
  Administered 2016-03-03: 10 mg via INTRAVENOUS
  Filled 2016-03-02: qty 2

## 2016-03-02 MED ORDER — INSULIN ASPART 100 UNIT/ML ~~LOC~~ SOLN
8.0000 [IU] | Freq: Once | SUBCUTANEOUS | Status: AC
  Administered 2016-03-02: 8 [IU] via SUBCUTANEOUS
  Filled 2016-03-02: qty 1

## 2016-03-02 MED ORDER — IOPAMIDOL (ISOVUE-370) INJECTION 76%
100.0000 mL | Freq: Once | INTRAVENOUS | Status: AC | PRN
  Administered 2016-03-02: 100 mL via INTRAVENOUS

## 2016-03-02 MED ORDER — SODIUM CHLORIDE 0.9 % IJ SOLN
INTRAMUSCULAR | Status: AC
  Filled 2016-03-02: qty 50

## 2016-03-02 NOTE — ED Notes (Signed)
Bed: WA04 Expected date:  Expected time:  Means of arrival:  Comments: 

## 2016-03-02 NOTE — ED Provider Notes (Signed)
[facility] DEPT Provider Note   CSN: [id number] Arrival date & time: 03/02/16  1914     History   Chief Complaint Chief Complaint  Patient presents with  . Chest Pain    HPI Maureen [name] is a 46 y.o. female.Complains of pleuritic chest pain anterior onset gradually 2 days ago, becoming worse at 8 AM today. Pain is nonradiating worse with deep inspiration associated symptoms include nausea P. She denies fever denies cough pain is constant. She also complains of right calf pain for the past 3-4 days. She admits to missing at least 1 dose ofXarelto. No fever. No other associated symptoms. No treatment prior to coming here HPI  Past Medical History:  Diagnosis Date  . Abdominal pain   . Achilles tendon contracture, left    with lisfrabc fracture  . Anxiety   . Arthritis   . Biliary dyskinesia   . Cervical strain   . Closed head injury 2010  . Depression   . Elevated LFTs   . Fibromyalgia   . GERD (gastroesophageal reflux disease)   . History of kidney stones   . Hyperlipidemia   . Hypoglycemia   . Joint pain   . Major depressive disorder   . Migraine    hx of none recent  . Nausea & vomiting   . Pneumonia   . Post traumatic stress disorder (PTSD)   . Pulmonary embolism ([place])   . Renal stones   . Skin rash    due to medications  . Sleep trouble   . Stroke [facility])    " series of mini strokes around 2007"  . TIA (transient ischemic attack)   . Wears glasses   . 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)    Type 1    Patient Active Problem List   Diagnosis Date Noted  . Acute deep vein thrombosis (DVT) of femoral vein of left lower extremity ([place]) 02/10/2016  . Diabetic polyneuropathy associated with type 2 diabetes mellitus ([place]) 02/10/2016  . Lisfranc dislocation, left, sequela 12/16/2015  . Short Achilles tendon (acquired), left ankle 12/16/2015  . Chest pain with moderate risk for cardiac etiology 11/29/2015  .  Elevated troponin 11/19/2015  . Migraine headache 11/19/2015  . Neck pain 11/19/2015  . Acute kidney injury ([place]) 02/19/2015  . DKA (diabetic ketoacidoses) ([place]) 10/24/2014  . HLD (hyperlipidemia) 10/24/2014  . Abnormal LFTs 09/03/2014  . Dizziness 09/03/2014  . Acute pulmonary embolism ([place]) 09/03/2014  . Transaminitis 09/02/2014  . GAD (generalized anxiety disorder) 11/23/2013  . PTSD (post-traumatic stress disorder) 11/23/2013  . 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 Endometriosis 12/08/2011  . Arthritis of left knee 12/08/2011  . Diabetic retinopathy ([place]) 12/08/2011  . Fibromyalgia 10/23/2011  . DM type 1 (diabetes mellitus, type 1) ([place]) 10/13/2011    Past Surgical History:  Procedure Laterality Date  . ABDOMINAL HYSTERECTOMY  2001  . APPENDECTOMY    . [place]  . CHOLECYSTECTOMY  02/10/2011   Procedure: LAPAROSCOPIC CHOLECYSTECTOMY WITH INTRAOPERATIVE CHOLANGIOGRAM;  Surgeon: Judieth Keens, DO;  Location: [facility];  Service: General;  Laterality: N/A;  . COLONOSCOPY    . DILATION AND CURETTAGE OF UTERUS    . exploratory laparomty  Mar 04, 2012  . EYE SURGERY  2006   laser eye surgery left eye  . KNEE SURGERY Left 2012  . LAPAROSCOPIC APPENDECTOMY N/A 10/12/2012   Procedure: DIAGNOSTIC APPENDECTOMY LAPAROSCOPIC;  Surgeon: Madilyn Hook, DO;  Location: [facility];  Service: General;  Laterality: N/A;  . lipoma removed  yrs ago  . LIVER BIOPSY    . ORIF ANKLE FRACTURE Left 01/03/2016   Procedure: OPEN REDUCTION INTERNAL FIXATION (ORIF) LEFT LISFRANC JOINT, GASTROC RECESSION;  Surgeon: Newt Minion, MD;  Location: [facility];  Service: Orthopedics;  Laterality: Left;  . ROBOTIC ASSISTED LAPAROSCOPIC LYSIS OF ADHESION N/A 03/04/2012   Procedure: ROBOTIC ASSISTED LAPAROSCOPIC LYSIS OF EXTENSIVE ADHESIONS;  Surgeon: Claiborne Billings A. Pamala Hurry, MD;  Location: [facility];  Service: Gynecology;  Laterality: N/A;  . TONSILLECTOMY  2001    OB  History    No data available       Home Medications    Prior to Admission medications   Medication Sig Start Date End Date Taking? Authorizing Provider  acetaminophen (TYLENOL) 500 MG tablet Take 1 tablet (500 mg total) by mouth every 8 (eight) hours as needed for headache. Patient taking differently: Take 1,000 mg by mouth every 8 (eight) hours as needed for headache.  11/20/15  Yes Mauricio Gerome Apley, MD  atorvastatin (LIPITOR) 40 MG tablet Take 40 mg by mouth at bedtime.    Yes Historical Provider, MD  famotidine (PEPCID) 20 MG tablet Take 1 tablet (20 mg total) by mouth 2 (two) times daily. Patient taking differently: Take 20 mg by mouth 2 (two) times daily as needed for heartburn or indigestion.  12/30/15  Yes Christopher End, MD  gabapentin (NEURONTIN) 300 MG capsule Take 300 mg by mouth daily as needed (for pain).  12/11/15  Yes Historical Provider, MD  insulin aspart (NOVOLOG) 100 UNIT/ML injection Inject 8 Units into the skin 3 (three) times daily with meals. 11/20/15  Yes Mauricio Gerome Apley, MD  LANTUS 100 UNIT/ML injection Inject 28 Units into the skin at bedtime.  11/20/15  Yes Historical Provider, MD  lidocaine (LIDODERM) 5 % Place 1 patch onto the skin daily as needed (pain). Remove after 12 hours. 10/30/15  Yes Historical Provider, MD  Oxycodone HCl 10 MG TABS Take 10 mg by mouth every 6 (six) hours as needed for pain. 12/16/15  Yes Historical Provider, MD  Rivaroxaban 15 & 20 MG TBPK Take as directed on package: Start with one 15mg  tablet by mouth twice a day with food. On Day 22, switch to one 20mg  tablet once a day with food. Patient taking differently: Take 20 mg by mouth daily. Pt was taking xarelto starter pack but has completed the 15mg  and started the 20mg  daily in the past week. 02/03/16  Yes Larene Pickett, PA-C  oxyCODONE-acetaminophen (PERCOCET/ROXICET) 5-325 MG tablet Take 1 tablet by mouth every 8 (eight) hours as needed for severe pain. Patient not taking:  Reported on 03/02/2016 01/06/16   Newt Minion, MD    Family History Family History  Problem Relation Age of Onset  . Cancer Father     lymphoma  . Nephrolithiasis Father   . Vascular Disease Father   . Alcohol abuse Father   . Drug abuse Father   . Cancer Maternal Grandmother     colon  . Hypertension Mother   . Heart disease Mother   . Cataracts Mother   . Depression Mother   . Diabetes Brother   . Drug abuse Brother   . Mental illness Maternal Grandfather   . Cancer Maternal Grandfather   . Heart disease Paternal Grandmother   . Heart disease Paternal Grandfather   . Suicidality Maternal Uncle     Social History Social History  Substance Use Topics  . Smoking status: Never Smoker  . Smokeless tobacco: Never Used  . Alcohol use No     Allergies   Cephalexin; Ibuprofen; and Meloxicam   Review of Systems Review of Systems  Constitutional: Negative.   HENT: Negative.   Respiratory: Negative.   Cardiovascular: Positive for chest pain.  Gastrointestinal: Positive for nausea.  Musculoskeletal: Negative.        Right calf pain. Wears walking boot on left leg since history of prior foot surgery  Skin: Negative.   Allergic/Immunologic: Positive for immunocompromised state.       Diabetic  Neurological: Negative.   Psychiatric/Behavioral: Negative.   All other systems reviewed and are negative.    Physical Exam Updated Vital Signs BP 131/70 (BP Location: Right Arm)   Pulse 93   Temp 98.6 F (37 C) (Oral)   Resp 26   SpO2 91%   Physical Exam  Constitutional: She appears well-developed and well-nourished.  HENT:  Head: Normocephalic and atraumatic.  Eyes: Conjunctivae are normal. Pupils are equal, round, and reactive to light.  Neck: Neck supple. No tracheal deviation present. No thyromegaly present.  Cardiovascular: Normal rate and regular rhythm.   No murmur heard. Pulmonary/Chest: Effort normal and breath sounds normal.  Abdominal: Soft. Bowel  sounds are normal. She exhibits no distension. There is no tenderness.  Musculoskeletal: Normal range of motion. She exhibits no edema or tenderness.  All 4 extremities without redness swelling or tenderness neurovascularly intact  Neurological: She is alert. Coordination normal.  Skin: Skin is warm and dry. No rash noted.  Psychiatric: She has a normal mood and affect.  Nursing note and vitals reviewed.    ED Treatments / Results  Labs (all labs ordered are listed, but only abnormal results are displayed) Labs Reviewed  BASIC METABOLIC PANEL - Abnormal; Notable for the following:       Result Value   Sodium 134 (*)    Chloride 99 (*)    Glucose, Bld 457 (*)    Creatinine, Ser 1.09 (*)    All other components within normal limits  CBG MONITORING, ED - Abnormal; Notable for the following:    Glucose-Capillary 408 (*)    All other components within normal limits  CBG MONITORING, ED - Abnormal; Notable for the following:    Glucose-Capillary 371 (*)    All other components within normal limits  CBC  I-STAT TROPOININ, ED    EKG  EKG Interpretation  Date/Time:  Monday March 02 2016 19:22:27 EST Ventricular Rate:  89 PR Interval:    QRS Duration: 81 QT Interval:  353 QTC Calculation: 430 R Axis:   97 Text Interpretation:  Sinus rhythm Borderline right axis deviation No significant change since last tracing Confirmed by Winfred Leeds  MD, Rainna Nearhood [phone]) on 03/02/2016 7:30:25 PM     Chest x-ray viewed by me Results for orders placed or performed during the hospital encounter of [id number]  Basic metabolic panel  Result Value Ref Range   Sodium 134 (L) 135 - 145 mmol/L   Potassium 4.6 3.5 - 5.1 mmol/L   Chloride 99 (L) 101 - 111 mmol/L   CO2 23 22 - 32 mmol/L   Glucose, Bld 457 (H) 65 - 99 mg/dL   BUN 14 6 - 20 mg/dL   Creatinine, Ser 1.09 (H) 0.44 - 1.00 mg/dL   Calcium 9.1 8.9 - 10.3 mg/dL   GFR calc non Af Amer >60 >60 mL/min   GFR calc Af Amer >60 >60  mL/min   Anion gap  12 5 - 15  CBG monitoring, ED  Result Value Ref Range   Glucose-Capillary 408 (H) 65 - 99 mg/dL   Comment 1 Notify RN   I-stat troponin, ED  Result Value Ref Range   Troponin i, poc 0.00 0.00 - 0.08 ng/mL   Comment 3          CBG monitoring, ED  Result Value Ref Range   Glucose-Capillary 371 (H) 65 - 99 mg/dL  I-stat chem 8, ed  Result Value Ref Range   Sodium 134 (L) 135 - 145 mmol/L   Potassium 4.1 3.5 - 5.1 mmol/L   Chloride 101 101 - 111 mmol/L   BUN 14 6 - 20 mg/dL   Creatinine, Ser 0.80 0.44 - 1.00 mg/dL   Glucose, Bld 315 (H) 65 - 99 mg/dL   Calcium, Ion 1.12 (L) 1.15 - 1.40 mmol/L   TCO2 24 0 - 100 mmol/L   Hemoglobin 13.6 12.0 - 15.0 g/dL   HCT 40.0 36.0 - 46.0 %   Dg Chest 2 View  Result Date: 03/02/2016 CLINICAL DATA:  Right leg and central chest pain since Saturday EXAM: CHEST  2 VIEW COMPARISON:  CXR 08/11/2015, chest CT 02/02/2016 FINDINGS: The heart size and mediastinal contours are within normal limits. Both lungs are clear. The visualized skeletal structures are unremarkable. IMPRESSION: No active cardiopulmonary disease. Electronically Signed   By: Ashley Royalty M.D.   On: 03/02/2016 19:50   Ct Angio Chest Pe W Or Wo Contrast  Result Date: 03/02/2016 CLINICAL DATA:  Right leg pain and central chest pain. Pain began on Saturday continued Sunday and today. Worse tonight. Nausea. History of DVT and pulmonary embolus. EXAM: CT ANGIOGRAPHY CHEST WITH CONTRAST TECHNIQUE: Multidetector CT imaging of the chest was performed using the standard protocol during bolus administration of intravenous contrast. Multiplanar CT image reconstructions and MIPs were obtained to evaluate the vascular anatomy. CONTRAST:  100 mL Isovue 370 COMPARISON:  02/02/2016 FINDINGS: Cardiovascular: Good opacification of the central and segmental pulmonary arteries. No focal filling defects. No evidence of significant pulmonary embolus. Normal heart size. Normal caliber thoracic aorta. No aortic  dissection. Great vessel origins are patent. Mediastinum/Nodes: No enlarged mediastinal, hilar, or axillary lymph nodes. Thyroid gland, trachea, and esophagus demonstrate no significant findings. Lungs/Pleura: Lungs are clear. No pleural effusion or pneumothorax. Upper Abdomen: No acute abnormality. Musculoskeletal: No chest wall abnormality. No acute or significant osseous findings. Review of the MIP images confirms the above findings. IMPRESSION: No significant pulmonary embolus. No evidence of active pulmonary disease. Electronically Signed   By: Lucienne Capers M.D.   On: 03/02/2016 23:14   Ct Angio Chest Pe W/cm &/or Wo Cm  Result Date: 02/02/2016 CLINICAL DATA:  PT W/ KNOW DVT SINCE SURGERY ON FOOT NOW HAVING CP AND SHOBCONCERN FOR PE NOWPT DIABETIC ON INSULIN EXAM: CT ANGIOGRAPHY CHEST WITH CONTRAST TECHNIQUE: Multidetector CT imaging of the chest was performed using the standard protocol during bolus administration of intravenous contrast. Multiplanar CT image reconstructions and MIPs were obtained to evaluate the vascular anatomy. CONTRAST:  100 mL of Isovue 370 intravenous contrast COMPARISON:  CTA, 11/19/2015 FINDINGS: Cardiovascular: Satisfactory opacification of the pulmonary arteries to the segmental level. No evidence of pulmonary embolism. Normal heart size. No pericardial effusion. No coronary artery calcifications. The thoracic aorta is normal in caliber. No dissection. No atherosclerotic changes. Mediastinum/Nodes: No enlarged mediastinal, hilar, or axillary lymph nodes. Thyroid gland, trachea, and esophagus demonstrate no significant findings. Lungs/Pleura:  Stable 2 mm subpleural nodule on the right. Lungs are otherwise clear. No pleural effusion. No pneumothorax. Upper Abdomen: No acute abnormality. Musculoskeletal: Normal. Review of the MIP images confirms the above findings. IMPRESSION: 1. No evidence of a pulmonary embolus. 2. No acute findings.  Lungs essentially clear. Electronically  Signed   By: Lajean Manes M.D.   On: 02/02/2016 21:18   Radiology Dg Chest 2 View  Result Date: 03/02/2016 CLINICAL DATA:  Right leg and central chest pain since Saturday EXAM: CHEST  2 VIEW COMPARISON:  CXR 08/11/2015, chest CT 02/02/2016 FINDINGS: The heart size and mediastinal contours are within normal limits. Both lungs are clear. The visualized skeletal structures are unremarkable. IMPRESSION: No active cardiopulmonary disease. Electronically Signed   By: Ashley Royalty M.D.   On: 03/02/2016 19:50    Procedures Procedures (including critical care time)  Medications Ordered in ED Medications  insulin aspart (novoLOG) injection 8 Units (not administered)  sodium chloride 0.9 % bolus 1,000 mL (not administered)  morphine 4 MG/ML injection 4 mg (not administered)   11:55 PM patient's pain is improved after treatment with intravenous morphine. She is still mildly nauseated. IV Reglan ordered and second IV bolus of normal saline ordered. Patient signed out to Dr.Polina ast 1158 pm  Initial Impression / Assessment and Plan / ED Course  I have reviewed the triage vital signs and the nursing notes.  Pertinent labs & imaging results that were available during my care of the patient were reviewed by me and considered in my medical decision making (see chart for details).      Doubt Q coronary syndrome. Heart score equals 2. Pulmonary embolism has been ruled out. I don't feel she needs further imaging of her right leg. She is instructed to a compliant with her Xarelto. She has appointment with her primary care physician on 03/05/2016 Final Clinical Impressions(s) / ED Diagnoses  Dx #1 atypical chest pain #2 hyperglycemia #3 medication noncompliance Final diagnoses:  Pain    New Prescriptions New Prescriptions   No medications on file     Orlie Dakin, MD 03/03/16 0001

## 2016-03-02 NOTE — ED Triage Notes (Signed)
Pt states on Saturday she started having pain in her right leg and Saturday evening she started having pain in the center of her chest  Pt states the chest pain continued Sunday and today and got worse tonight  Pt states she has nausea as well  Pt state she took Zofran and Pepcid at home  Pt has hx of DVT and PE

## 2016-03-02 NOTE — ED Notes (Signed)
Pt in CT.

## 2016-03-02 NOTE — ED Notes (Signed)
Was able to collect troponin but was unsuccessful with remaining labs

## 2016-03-03 LAB — CBG MONITORING, ED: Glucose-Capillary: 200 mg/dL — ABNORMAL HIGH (ref 65–99)

## 2016-03-03 MED ORDER — OXYCODONE-ACETAMINOPHEN 5-325 MG PO TABS
1.0000 | ORAL_TABLET | Freq: Once | ORAL | Status: AC
  Administered 2016-03-03: 1 via ORAL
  Filled 2016-03-03: qty 1

## 2016-03-03 NOTE — ED Provider Notes (Signed)
Patient signed out to me to follow-up on treatment of blood sugar. Patient noted to be hyperglycemic. She was treated with IV fluids and insulin, sugar has improved. Patient was initially worked up for atypical chest pain. She had CT angiography because she had missed some doses of her anticoagulation. No evidence of PE noted. Cardiac evaluation negative, no concern for acute coronary syndrome. Patient to be discharged, follow-up with primary care.   Orpah Greek, MD 03/03/16 [phone]

## 2016-03-05 ENCOUNTER — Encounter: Payer: Self-pay | Admitting: Family

## 2016-03-05 ENCOUNTER — Ambulatory Visit (INDEPENDENT_AMBULATORY_CARE_PROVIDER_SITE_OTHER): Payer: Medicare PPO | Admitting: Family

## 2016-03-05 ENCOUNTER — Other Ambulatory Visit (INDEPENDENT_AMBULATORY_CARE_PROVIDER_SITE_OTHER): Payer: Medicare PPO

## 2016-03-05 VITALS — BP 122/68 | HR 85 | Temp 98.4°F | Resp 16 | Ht 64.0 in | Wt 175.8 lb

## 2016-03-05 DIAGNOSIS — I82412 Acute embolism and thrombosis of left femoral vein: Secondary | ICD-10-CM | POA: Diagnosis not present

## 2016-03-05 DIAGNOSIS — E1065 Type 1 diabetes mellitus with hyperglycemia: Secondary | ICD-10-CM

## 2016-03-05 DIAGNOSIS — N809 Endometriosis, unspecified: Secondary | ICD-10-CM | POA: Diagnosis not present

## 2016-03-05 LAB — COMPREHENSIVE METABOLIC PANEL
ALT: 14 U/L (ref 0–35)
AST: 13 U/L (ref 0–37)
Albumin: 3.9 g/dL (ref 3.5–5.2)
Alkaline Phosphatase: 133 U/L — ABNORMAL HIGH (ref 39–117)
BUN: 13 mg/dL (ref 6–23)
CO2: 27 mEq/L (ref 19–32)
Calcium: 9.2 mg/dL (ref 8.4–10.5)
Chloride: 103 mEq/L (ref 96–112)
Creatinine, Ser: 0.89 mg/dL (ref 0.40–1.20)
GFR: 88.87 mL/min (ref >60.00)
Glucose, Bld: 391 mg/dL — ABNORMAL HIGH (ref 70–99)
Potassium: 4.2 mEq/L (ref 3.5–5.1)
Sodium: 137 mEq/L (ref 135–145)
Total Bilirubin: 0.4 mg/dL (ref 0.2–1.2)
Total Protein: 7.5 g/dL (ref 6.0–8.3)

## 2016-03-05 LAB — MICROALBUMIN / CREATININE URINE RATIO
Creatinine,U: 83.9 mg/dL
Microalb Creat Ratio: 1.8 mg/g (ref 0.0–30.0)
Microalb, Ur: 1.5 mg/dL (ref 0.0–1.9)

## 2016-03-05 LAB — HEMOGLOBIN A1C: Hgb A1c MFr Bld: 9.3 % — ABNORMAL HIGH (ref 4.6–6.5)

## 2016-03-05 MED ORDER — INSULIN PEN NEEDLE 32G X 4 MM MISC: 1 refills | Status: DC

## 2016-03-05 MED ORDER — INSULIN ASPART 100 UNIT/ML FLEXPEN: 8.0000 [IU] | PEN_INJECTOR | Freq: Three times a day (TID) | SUBCUTANEOUS | 0 refills | Status: DC

## 2016-03-05 MED ORDER — INSULIN GLARGINE 100 UNIT/ML SOLOSTAR PEN: 28.0000 [IU] | PEN_INJECTOR | Freq: Every day | SUBCUTANEOUS | 2 refills | Status: DC

## 2016-03-05 MED ORDER — RIVAROXABAN 20 MG PO TABS: 20.0000 mg | ORAL_TABLET | Freq: Every day | ORAL | 11 refills | Status: DC

## 2016-03-05 MED ORDER — INSULIN GLARGINE 100 UNIT/ML ~~LOC~~ SOLN: 28.0000 [IU] | Freq: Every day | SUBCUTANEOUS | 0 refills | Status: DC

## 2016-03-05 MED ORDER — INSULIN ASPART 100 UNIT/ML ~~LOC~~ SOLN: 8.0000 [IU] | Freq: Three times a day (TID) | SUBCUTANEOUS | 0 refills | Status: DC

## 2016-03-05 NOTE — Assessment & Plan Note (Signed)
Previously diagnosed with endometriosis and noted to have scar tissue following hysterectomy that continually causes her pain. Stable with current dosage of oxycodone taken as needed for pain. Refer to pain management for pelvic pain and back pain. Continue current dosage of oxycodone as needed.

## 2016-03-05 NOTE — Assessment & Plan Note (Signed)
Stable with no symptoms of chest pain or shortness of breath and maintained on Xarelto for anticoagulation with no adverse side effects. Refer to hematology given increased hypercoagulability. 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 Continue current dosage of Xarelto.

## 2016-03-05 NOTE — Progress Notes (Signed)
Subjective:    Patient ID: Maureen [name], female    DOB: 29-Oct-1970, 46 y.o.   MRN: [id number]  Chief Complaint  Patient presents with  . Establish Care    blood clots, refill on xaralto, needs refill on insulin, needs diabetes check, sugars have been elevated    HPI:  Maureen [name] is a 46 y.o. 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 and presents today for an office visit to establish care.   1.) Type 1 diabetes - currently maintained on Lantus and NovoLog. Reports her medication as prescribed and denies adverse side effects. Blood sugars at home have been "terrible." Does have neuropathy that is managed with gabapentin and worsened when it is cold outside. Due for a diabetic eye exam. Pneumovax is up to date. Nutritionally she has been working on improving the quality of her diet. She has also been battling nausea which has limited her intake. Not exercising on a regular basis secondary to her fractured foot.   Lab Results  Component Value Date   HGBA1C 9.3 (H) 03/05/2016    2.) DVT / PE - Previously diagnosed with pulmonary embolism that occurred when she was seated in the car for a trip to [place]. Most recently about 1 month ago was noted to have a new DVT. Currently maintained on Xarelto and reports taking the medication as prescribed and denies any increase in bleeding or nuisance  bleeding.  3.) Chronic pain - Associated symptom of chronic pelvic pain, back pain, and diabetic neuropathy have been going on for several years and currently maintained on oxycodone. Reports taking the medication as prescribed and denies adverse side effects or constipation. Previously managed by pain management and is need a new referral to pain management.   Allergies  Allergen Reactions  . Cephalexin Anaphylaxis, Shortness Of Breath, Rash and Other (See Comments)    Pt was admitted to the hospital upon taking.   . Ibuprofen Nausea And Vomiting and Rash  . Meloxicam Nausea Only      Outpatient Medications Prior to Visit  Medication Sig Dispense Refill  . acetaminophen (TYLENOL) 500 MG tablet Take 1 tablet (500 mg total) by mouth every 8 (eight) hours as needed for headache. (Patient taking differently: Take 1,000 mg by mouth every 8 (eight) hours as needed for headache. ) 30 tablet 0  . atorvastatin (LIPITOR) 40 MG tablet Take 40 mg by mouth at bedtime.     . famotidine (PEPCID) 20 MG tablet Take 1 tablet (20 mg total) by mouth 2 (two) times daily. (Patient taking differently: Take 20 mg by mouth 2 (two) times daily as needed for heartburn or indigestion. ) 60 tablet 6  . gabapentin (NEURONTIN) 300 MG capsule Take 300 mg by mouth daily as needed (for pain).   2  . lidocaine (LIDODERM) 5 % Place 1 patch onto the skin daily as needed (pain). Remove after 12 hours.  0  . Oxycodone HCl 10 MG TABS Take 10 mg by mouth every 6 (six) hours as needed for pain.  0  .  insulin aspart (NOVOLOG) 100 UNIT/ML injection Inject 8 Units into the skin 3 (three) times daily with meals. 10 mL 0  . LANTUS 100 UNIT/ML injection Inject 28 Units into the skin at bedtime.   0  . Rivaroxaban 15 & 20 MG TBPK Take as directed on package: Start with one 15mg  tablet by mouth twice a day with food. On Day 22, switch to one 20mg  tablet once a day with food. (Patient taking differently: Take 20 mg by mouth daily. Pt was  taking xarelto starter pack but has completed the 15mg  and started the 20mg  daily in the past week.) 51 each 0  . oxyCODONE-acetaminophen (PERCOCET/ROXICET) 5-325 MG tablet Take 1 tablet by mouth every 8 (eight) hours as needed for severe pain. (Patient not taking: Reported on 03/02/2016) 60 tablet 0   No facility-administered medications prior to visit.      Past Medical History:  Diagnosis Date  . Abdominal pain   . Achilles tendon contracture, left    with lisfrabc fracture  . Anxiety   . Arthritis   . Biliary dyskinesia   . Cervical strain   . Closed head injury 2010  . Depression   . Elevated LFTs   . Fibromyalgia   . GERD (gastroesophageal reflux disease)   . History of kidney stones   . Hyperlipidemia   . Hypoglycemia   . Joint pain   . Major depressive disorder   . Migraine    hx of none recent  . Nausea & vomiting   . Pneumonia   . Post traumatic stress disorder (PTSD)   . Pulmonary embolism ([place])   . Renal stones   . Skin rash    due to medications  . Sleep trouble   . Stroke [facility])    " series of mini strokes around 2007"  . TIA (transient ischemic attack)   . Wears glasses   . 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 ABDOMINAL HYSTERECTOMY  2001  . APPENDECTOMY    . [place]  . CHOLECYSTECTOMY  02/10/2011   Procedure: LAPAROSCOPIC CHOLECYSTECTOMY WITH INTRAOPERATIVE CHOLANGIOGRAM;  Surgeon: Judieth Keens, DO;  Location: [place];  Service: General;  Laterality: N/A;  . COLONOSCOPY    . DILATION AND CURETTAGE OF UTERUS    . exploratory laparomty  Mar 04, 2012  . EYE SURGERY  2006   laser eye surgery left eye  . KNEE SURGERY Left 2012  . LAPAROSCOPIC APPENDECTOMY N/A 10/12/2012   Procedure: DIAGNOSTIC APPENDECTOMY LAPAROSCOPIC;  Surgeon: Madilyn Hook, DO;  Location[facility];  Service: General;  Laterality: N/A;  . lipoma removed   yrs ago  . LIVER BIOPSY    . ORIF ANKLE FRACTURE Left 01/03/2016   Procedure: OPEN REDUCTION INTERNAL FIXATION (ORIF) LEFT LISFRANC JOINT, GASTROC RECESSION;  Surgeon: Newt Minion, MD;  Location: [place];  Service: Orthopedics;  Laterality: Left;  . ROBOTIC ASSISTED LAPAROSCOPIC LYSIS OF ADHESION N/A 03/04/2012   Procedure: ROBOTIC ASSISTED LAPAROSCOPIC LYSIS OF EXTENSIVE ADHESIONS;  Surgeon: Claiborne Billings A. Pamala Hurry, MD;  Location: [facility];  Service: Gynecology;  Laterality: N/A;  . TONSILLECTOMY  2001      Family History  Problem Relation Age of Onset  . Cancer Father     lymphoma  . Nephrolithiasis Father   . Vascular Disease Father   . Alcohol abuse Father   .  Drug abuse Father   . Cancer Maternal Grandmother     colon  . Hypertension Mother   . Heart disease Mother   . Cataracts Mother   . Depression Mother   . Diabetes Brother   . Drug abuse Brother   . Mental illness Maternal Grandfather   . Cancer Maternal Grandfather   . Heart disease Paternal Grandmother   . Heart disease Paternal Grandfather   . Suicidality Maternal Uncle       Social History   Social History  . Marital status: Divorced    Spouse name: Mkenzie Tacker  . Number of children: 2  . Years of education: 12+   Occupational History  . 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 Smoking status: Never Smoker  . Smokeless tobacco: Never Used  . Alcohol use Yes     Comment: Socially  . Drug use: No  . Sexual activity: Not on file   Other Topics Concern  . Not on file   Social History Narrative   Lives independently.  Before going back to school, she was a Research scientist (physical sciences) at [employer].  Stop working when she was diagnosed with endometriosis and had a TIA.     Review of Systems  Constitutional: Negative for chills, fatigue and fever.  Eyes:       Denies changes in vision  Respiratory: Negative for chest tightness and shortness of breath.   Cardiovascular: Negative  for chest pain, palpitations and leg swelling.  Endocrine: Negative for polydipsia, polyphagia and polyuria.  Neurological: Negative for numbness.  Hematological: Negative for adenopathy. Does not bruise/bleed easily.       Objective:    BP 122/68 (BP Location: Left Arm, Patient Position: Sitting, Cuff Size: Large)   Pulse 85   Temp 98.4 F (36.9 C) (Oral)   Resp 16   Ht 5\' 4"  (1.626 m)   Wt 175 lb 12.8 oz (79.7 kg)   SpO2 94%   BMI 30.18 kg/m  Nursing note and vital signs reviewed.  Physical Exam  Constitutional: She is oriented to person, place, and time. She appears well-developed and well-nourished. No distress.  Cardiovascular: Normal rate, regular rhythm, normal heart sounds and intact distal pulses.   Pulmonary/Chest: Effort normal and breath sounds normal.  Musculoskeletal:  Diabetic Foot Exam - Simple   Simple Foot Form Diabetic Foot exam was performed with the following findings:  Yes  03/05/2016  9:39 AM  Visual Inspection No deformities, no ulcerations, no other skin breakdown bilaterally:  Yes Sensation Testing Intact to touch and monofilament testing bilaterally:  Yes Pulse Check Posterior Tibialis and Dorsalis pulse intact bilaterally:  Yes  Neurological: She is alert and oriented to person, place, and time.  Skin: Skin is warm and dry.  Psychiatric: She has a normal mood and affect. Her behavior is normal. Judgment and thought content normal.        Assessment & Plan:   Problem List Items Addressed This Visit      Cardiovascular and Mediastinum   Acute deep vein thrombosis (DVT) of femoral vein of left lower extremity (HCC)    Stable with no symptoms of chest pain or shortness of breath and maintained on Xarelto for anticoagulation with no adverse side effects. Refer to hematology given increased hypercoagulability. 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 Continue current dosage of Xarelto.       Relevant  Medications   rivaroxaban (XARELTO) 20  MG TABS tablet   Other Relevant Orders   Ambulatory referral to Hematology     Endocrine   DM type 1 (diabetes mellitus, type 1) (HCC) - Primary    Type 1 diabetes with neuropathy and poorly controlled blood sugars. Obtain A1c, urine microalbumin, and CMET. Pneumovax is up to date. Diabetic foot exam completed today. Due for eye exam encouraged to be completed independently with request for results to be sent to our office. Maintained on atorvastatin for CAD risk reduction. Continue to monitor blood sugars at home 3-4 times per day. Continue current dosage of Lantus and Novolog pending lab work results.       Relevant Medications   Insulin Pen Needle (BD PEN NEEDLE NANO U/F) 32G X 4 MM MISC   insulin aspart (NOVOLOG) 100 UNIT/ML injection   insulin glargine (LANTUS) 100 UNIT/ML injection   Other Relevant Orders   Hemoglobin A1c (Completed)   Comprehensive metabolic panel (Completed)   Urine Microalbumin w/creat. ratio (Completed)     Other   Endometriosis    Previously diagnosed with endometriosis and noted to have scar tissue following hysterectomy that continually causes her pain. Stable with current dosage of oxycodone taken as needed for pain. Refer to pain management for pelvic pain and back pain. Continue current dosage of oxycodone as needed.       Relevant Orders   Ambulatory referral to Pain Clinic       I have discontinued Ms. Diel's oxyCODONE-acetaminophen, Rivaroxaban, and Insulin Glargine. I have also changed her LANTUS to insulin glargine. Additionally, I am having her start on Insulin Pen Needle and rivaroxaban. Lastly, I am having her maintain her atorvastatin, lidocaine, acetaminophen, Oxycodone HCl, gabapentin, famotidine, and insulin aspart.   Meds ordered this encounter  Medications  . DISCONTD: Insulin Glargine (LANTUS SOLOSTAR) 100 UNIT/ML Solostar Pen    Sig: Inject 28 Units into the skin daily at 10 pm.     Dispense:  15 mL    Refill:  2    Order Specific Question:   Supervising Provider    Answer:   Pricilla Holm A [id number]  . DISCONTD: insulin aspart (NOVOLOG FLEXPEN) 100 UNIT/ML FlexPen    Sig: Inject 8 Units into the skin 3 (three) times daily with meals.    Dispense:  15 mL    Refill:  0    Order Specific Question:   Supervising Provider    Answer:   Pricilla Holm A [id number]  . Insulin Pen Needle (BD PEN NEEDLE NANO U/F) 32G X 4 MM MISC    Sig: Use 1 needle per injection to inject insulin 4x daily as instructed.    Dispense:  100 each    Refill:  1    Substitution permissible per insurance coverage. Dx: E10.9    Order Specific Question:   Supervising Provider    Answer:   Pricilla Holm A [id number]  . rivaroxaban (XARELTO) 20 MG TABS tablet    Sig: Take 1 tablet (20 mg total) by mouth daily with supper.    Dispense:  30 tablet    Refill:  11    Order Specific Question:   Supervising Provider    Answer:   Pricilla Holm A [id number]  . insulin aspart (NOVOLOG) 100 UNIT/ML injection    Sig: Inject 8 Units into the skin 3 (three) times daily with meals.    Dispense:  10 mL    Refill:  0    Order Specific Question:   Supervising Provider  Answer:   Pricilla Holm A [id number]  . insulin glargine (LANTUS) 100 UNIT/ML injection    Sig: Inject 0.28 mLs (28 Units total) into the skin at bedtime.    Dispense:  10 mL    Refill:  0    Order Specific Question:   Supervising Provider    Answer:   Pricilla Holm A [id number]     Follow-up: Return in about 1 month (around 04/02/2016), or if symptoms worsen or fail to improve.  Mauricio Po, FNP

## 2016-03-05 NOTE — Patient Instructions (Addendum)
Thank you for choosing [facility].  SUMMARY AND INSTRUCTIONS:  Please continue to take your medication as prescribed.   They will call with referrals to pain management and hematology.  Continue with your Xarelto.   Medication:  Your prescription(s) have been submitted to your pharmacy or been printed and provided for you. Please take as directed and contact our office if you believe you are having problem(s) with the medication(s) or have any questions.  Labs:  Please stop by the lab on the lower level of the building for your blood work. Your results will be released to [facility] (or called to you) after review, usually within 72 hours after test completion. If any changes need to be made, you will be notified at that same time.  1.) The lab is open from 7:30am to 5:30 pm Monday-Friday 2.) No appointment is necessary 3.) Fasting (if needed) is 6-8 hours after food and drink; black coffee and water are okay   Follow up:  If your symptoms worsen or fail to improve, please contact our office for further instruction, or in case of emergency go directly to the emergency room at the closest medical facility.

## 2016-03-05 NOTE — Assessment & Plan Note (Signed)
Type 1 diabetes with neuropathy and poorly controlled blood sugars. Obtain A1c, urine microalbumin, and CMET. Pneumovax is up to date. Diabetic foot exam completed today. Due for eye exam encouraged to be completed independently with request for results to be sent to our office. Maintained on atorvastatin for CAD risk reduction. Continue to monitor blood sugars at home 3-4 times per day. Continue current dosage of Lantus and Novolog pending lab work results.

## 2016-03-09 ENCOUNTER — Ambulatory Visit (INDEPENDENT_AMBULATORY_CARE_PROVIDER_SITE_OTHER): Payer: Medicare PPO | Admitting: Orthopedic Surgery

## 2016-03-09 ENCOUNTER — Encounter (INDEPENDENT_AMBULATORY_CARE_PROVIDER_SITE_OTHER): Payer: Self-pay | Admitting: Orthopedic Surgery

## 2016-03-09 VITALS — Ht 64.0 in | Wt 175.0 lb

## 2016-03-09 DIAGNOSIS — M6702 Short Achilles tendon (acquired), left ankle: Secondary | ICD-10-CM

## 2016-03-09 DIAGNOSIS — S93325S Dislocation of tarsometatarsal joint of left foot, sequela: Secondary | ICD-10-CM

## 2016-03-09 DIAGNOSIS — I82412 Acute embolism and thrombosis of left femoral vein: Secondary | ICD-10-CM

## 2016-03-09 MED ORDER — OXYCODONE-ACETAMINOPHEN 5-325 MG PO TABS: 1.0000 | ORAL_TABLET | ORAL | 0 refills | Status: DC | PRN

## 2016-03-09 NOTE — Progress Notes (Signed)
Office Visit Note   Patient: Maureen [name]           Date of Birth: 1970/09/25           MRN: [id number] Visit Date: 03/09/2016              Requested by: No referring provider defined for this encounter. PCP: Pcp Not In System  Chief Complaint  Patient presents with  . Left Foot - Routine Post Op    left foot ORIF Lisfranc joint with gastrocnemius recession on 01/03/16.     HPI: Patient is a 46 y.o female here for follow up of left foot ORIF Lisfranc joint with gastrocnemius recession on 01/03/16. She states she still has pain on occasion. She complains of increased pain with bad weather. She is ambulating with cam walker. She is positive for DVT. She is being referred to hematologist by her medical doctor. She is currently taking xarleto 20mg  daily. Maxcine Ham, RT  Patient has a follow up appointment with hematology to evaluate the cause for her 2 episodes of DVT. A tourniquet was not used for her internal fixation for the Lisfranc fracture dislocation.  Assessment & Plan: Visit Diagnoses: No diagnosis found.  Plan: Advance activities as tolerated with a fracture boot. Provided for Percocet for pain she cannot take anti-inflammatories due to the fact that she is on Xarelto. She is given instructions to work on dorsiflexion exercises of the ankle 5 times a day.  Three-view radiographs of the right foot at follow-up.  Follow-Up Instructions: No Follow-up on file.   Ortho Exam Examination her foot is plantigrade she has dorsiflexion to neutral. There is no redness no cellulitis no signs of infection there is no open wound. There is no sign hypersensitivity to light touch. Calf is soft and nontender.  Imaging: No results found.  Orders:  No orders of the defined types were placed in this encounter.  No orders of the defined types were placed in this encounter.    Procedures: No procedures performed  Clinical Data: No additional  findings.  Subjective: Review of Systems  Objective: Vital Signs: There were no vitals taken for this visit.  Specialty Comments:  No specialty comments available.  PMFS History: Patient Active Problem List   Diagnosis Date Noted  . Acute deep vein thrombosis (DVT) of femoral vein of left lower extremity ([place]) 02/10/2016  . Diabetic polyneuropathy associated with type 2 diabetes mellitus ([place]) 02/10/2016  . Lisfranc dislocation, left, sequela 12/16/2015  . Short Achilles tendon (acquired), left ankle 12/16/2015  . Chest pain with moderate risk for cardiac etiology 11/29/2015  . Elevated troponin 11/19/2015  . Migraine headache 11/19/2015  . Neck pain 11/19/2015  . Acute kidney injury ([place]) 02/19/2015  . DKA (diabetic ketoacidoses) ([place]) 10/24/2014  . HLD (hyperlipidemia) 10/24/2014  . Abnormal LFTs 09/03/2014  . Dizziness 09/03/2014  . Acute pulmonary embolism ([place]) 09/03/2014  . Transaminitis 09/02/2014  . GAD (generalized anxiety disorder) 11/23/2013  . PTSD (post-traumatic stress disorder) 11/23/2013  . Severe major depression without psychotic features ([place]) 10/10/2013  . Endometriosis 12/08/2011  . Arthritis of left knee 12/08/2011  . Diabetic retinopathy ([place]) 12/08/2011  . Fibromyalgia 10/23/2011  . DM type 1 (diabetes mellitus, type 1) ([place]) 10/13/2011   Past Medical History:  Diagnosis Date  . Abdominal pain   . Achilles tendon contracture, left    with lisfrabc fracture  . Anxiety   . Arthritis   . Biliary dyskinesia   . Cervical  strain   . Closed head injury 2010  . Depression   . Elevated LFTs   . Fibromyalgia   . GERD (gastroesophageal reflux disease)   . History of kidney stones   . Hyperlipidemia   . Hypoglycemia   . Joint pain   . Major depressive disorder   . Migraine    hx of none recent  . Nausea & vomiting   . Pneumonia   . Post traumatic stress disorder (PTSD)   . Pulmonary embolism ([place])   . Renal stones   . Skin rash    due to  medications  . Sleep trouble   . Stroke [facility])    " series of mini strokes around 2007"  . TIA (transient ischemic attack)   . Wears glasses   . 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 Cancer Father     lymphoma  . Nephrolithiasis Father   . Vascular Disease Father   . Alcohol abuse Father   . Drug abuse Father   . Cancer Maternal Grandmother     colon  . Hypertension Mother   . Heart disease Mother   . Cataracts Mother   . Depression Mother   . Diabetes Brother   . Drug abuse Brother   . Mental illness Maternal Grandfather   . Cancer Maternal Grandfather   . Heart disease Paternal Grandmother   . Heart disease Paternal Grandfather   . Suicidality Maternal Uncle     Past Surgical History:  Procedure Laterality Date  . ABDOMINAL HYSTERECTOMY  2001  . APPENDECTOMY    . [place]  . CHOLECYSTECTOMY  02/10/2011   Procedure: LAPAROSCOPIC CHOLECYSTECTOMY WITH INTRAOPERATIVE CHOLANGIOGRAM;  Surgeon: Judieth Keens, DO;  Location: [place];  Service: General;  Laterality: N/A;  . COLONOSCOPY    . DILATION AND CURETTAGE OF UTERUS    . exploratory laparomty  Mar 04, 2012  . EYE SURGERY  2006   laser eye surgery left eye  . KNEE SURGERY Left 2012  . LAPAROSCOPIC APPENDECTOMY N/A 10/12/2012   Procedure: DIAGNOSTIC APPENDECTOMY LAPAROSCOPIC;  Surgeon: Madilyn Hook, DO;  [facility];  Service: General;  Laterality: N/A;  . lipoma removed  yrs ago  . LIVER BIOPSY    . ORIF ANKLE FRACTURE Left 01/03/2016   Procedure: OPEN REDUCTION INTERNAL FIXATION (ORIF) LEFT LISFRANC JOINT, GASTROC RECESSION;  Surgeon: Newt Minion, MD;  Location: [place];  Service: Orthopedics;  Laterality: Left;  . ROBOTIC ASSISTED LAPAROSCOPIC LYSIS OF ADHESION N/A 03/04/2012   Procedure: ROBOTIC ASSISTED LAPAROSCOPIC LYSIS OF EXTENSIVE ADHESIONS;  Surgeon: Claiborne Billings A. Pamala Hurry, MD;  Location: [place]  [facility];  Service: Gynecology;  Laterality: N/A;  . 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 Smoking status: Never Smoker  . Smokeless tobacco: Never Used  . Alcohol use Yes     Comment: Socially  . Drug use: No  . Sexual activity: Not on file

## 2016-03-17 ENCOUNTER — Encounter: Payer: Self-pay | Admitting: Family

## 2016-03-21 ENCOUNTER — Emergency Department (HOSPITAL_BASED_OUTPATIENT_CLINIC_OR_DEPARTMENT_OTHER)
Admission: EM | Admit: 2016-03-21 | Discharge: 2016-03-21 | Disposition: A | Discharge status: Home / Self Care | Payer: Medicare PPO | Attending: Emergency Medicine | Admitting: Emergency Medicine

## 2016-03-21 ENCOUNTER — Encounter (HOSPITAL_BASED_OUTPATIENT_CLINIC_OR_DEPARTMENT_OTHER): Payer: Self-pay | Admitting: Adult Health

## 2016-03-21 DIAGNOSIS — Z760 Encounter for issue of repeat prescription: Secondary | ICD-10-CM | POA: Diagnosis not present

## 2016-03-21 DIAGNOSIS — Z79899 Other long term (current) drug therapy: Secondary | ICD-10-CM | POA: Insufficient documentation

## 2016-03-21 DIAGNOSIS — Z794 Long term (current) use of insulin: Secondary | ICD-10-CM | POA: Insufficient documentation

## 2016-03-21 DIAGNOSIS — E119 Type 2 diabetes mellitus without complications: Secondary | ICD-10-CM | POA: Diagnosis not present

## 2016-03-21 NOTE — ED Triage Notes (Signed)
PT reports that she sees a pain management clinic and she went on a trip and came back Friday and she is missing a bag with her pain medication and her insulin, and xarelto and contact lenses. She reports that she has insulin at home and she had a few pain pills left but she takes 10mg  of oxycodone every 6 hours and she is gong out of town MOnday at [place] in the AM. She says she will not do well without a refill of the pan medication. She has a reill left for her xarelto and her atorvastin, due to pain management she does not have a refill for oxycodone/.

## 2016-03-21 NOTE — ED Provider Notes (Signed)
[facility] DEPT [facility] Provider Note   CSN: [id number] Arrival date & time: 03/21/16  1559     History   Chief Complaint Chief Complaint  Patient presents with  . Medication Refill    HPI Maureen [name] is a 46 y.o. female  presenting today with her usual chronic pain to seek medication refill on her pain medications. She reports that she will be leaving to [facility] on Monday morning and was not able to contact with her primary care provider who provides her with pain medication. She states she lost the bag she had with her pain medications along with her insulin, atorvastatin, and Xarelto. She reports that she has insulin at home. She has a reill left for her xarelto and [facility]. She states she has a few pain pills left but she takes 10mg  of oxycodone every 6 hours and she is gong out of town [employer]. She says she will not do well without a refill of the pan medication. She states she does not have a refill on her oxycodone. She denies any fevers, chills, chest pain, SOB. She denies any new symptoms today.  The history is provided by the patient. No language interpreter was used.  Medication Refill    Past Medical History:  Diagnosis Date  . Abdominal pain   . Achilles tendon contracture, left    with lisfrabc fracture  . Anxiety   . Arthritis   . Biliary dyskinesia   . Cervical strain   . Closed head injury 2010  . Depression   . Elevated LFTs   . Fibromyalgia   . GERD (gastroesophageal reflux disease)   . History of kidney stones   . Hyperlipidemia   . Hypoglycemia   . Joint pain   . Major depressive disorder   . Migraine    hx of none recent  . Nausea & vomiting   . Pneumonia   . Post traumatic stress disorder (PTSD)   . Pulmonary embolism ([place])   . Renal stones   . Skin rash    due to medications  . Sleep trouble   . Stroke [facility])    " series of mini strokes around 2007"  . TIA (transient ischemic attack)   . Wears glasses   . 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 Acute deep vein thrombosis (DVT) of femoral vein of left lower extremity ([place]) 02/10/2016  . Diabetic polyneuropathy associated with type 2 diabetes mellitus ([place]) 02/10/2016  . Lisfranc dislocation, left, sequela 12/16/2015  . Short Achilles tendon (acquired), left ankle 12/16/2015  . Chest pain with moderate risk for cardiac etiology 11/29/2015  . Elevated troponin 11/19/2015  . Migraine headache 11/19/2015  . Neck pain 11/19/2015  . Acute kidney injury ([place]) 02/19/2015  . DKA (diabetic ketoacidoses) ([place]) 10/24/2014  . HLD (hyperlipidemia) 10/24/2014  . Abnormal LFTs 09/03/2014  . Dizziness 09/03/2014  . Acute pulmonary embolism ([place]) 09/03/2014  . Transaminitis 09/02/2014  . GAD (generalized anxiety disorder) 11/23/2013  . PTSD (post-traumatic stress disorder) 11/23/2013  . Severe major depression without psychotic features ([place]) 10/10/2013  . Endometriosis 12/08/2011  . Arthritis of left knee 12/08/2011  . Diabetic retinopathy ([place]) 12/08/2011  . Fibromyalgia 10/23/2011  . DM type 1 (diabetes mellitus, type 1) ([place]) 10/13/2011    Past Surgical History:  Procedure Laterality Date  . ABDOMINAL HYSTERECTOMY  2001  .  APPENDECTOMY    . [place]  . CHOLECYSTECTOMY  02/10/2011   Procedure: LAPAROSCOPIC CHOLECYSTECTOMY WITH INTRAOPERATIVE CHOLANGIOGRAM;  Surgeon: Judieth Keens, DO;  Location: [facility];  Service: General;  Laterality: N/A;  . COLONOSCOPY    . DILATION AND CURETTAGE OF UTERUS    . exploratory laparomty  Mar 04, 2012  . EYE SURGERY  2006   laser eye surgery left eye  . KNEE SURGERY Left 2012  . LAPAROSCOPIC APPENDECTOMY N/A 10/12/2012   Procedure: DIAGNOSTIC APPENDECTOMY LAPAROSCOPIC;  Surgeon: Madilyn Hook, DO;  Location: [facility];  Service: General;  Laterality: N/A;  . lipoma removed  yrs ago  . LIVER  BIOPSY    . ORIF ANKLE FRACTURE Left 01/03/2016   Procedure: OPEN REDUCTION INTERNAL FIXATION (ORIF) LEFT LISFRANC JOINT, GASTROC RECESSION;  Surgeon: Newt Minion, MD;  Location: [facility];  Service: Orthopedics;  Laterality: Left;  . ROBOTIC ASSISTED LAPAROSCOPIC LYSIS OF ADHESION N/A 03/04/2012   Procedure: ROBOTIC ASSISTED LAPAROSCOPIC LYSIS OF EXTENSIVE ADHESIONS;  Surgeon: Claiborne Billings A. Pamala Hurry, MD;  Location: [facility];  Service: Gynecology;  Laterality: N/A;  . TONSILLECTOMY  2001    OB History    No data available       Home Medications    Prior to Admission medications   Medication Sig Start Date End Date Taking? Authorizing Provider  acetaminophen (TYLENOL) 500 MG tablet Take 1 tablet (500 mg total) by mouth every 8 (eight) hours as needed for headache. Patient taking differently: Take 1,000 mg by mouth every 8 (eight) hours as needed for headache.  11/20/15   Mauricio Gerome Apley, MD  atorvastatin (LIPITOR) 40 MG tablet Take 40 mg by mouth at bedtime.     Historical Provider, MD  famotidine (PEPCID) 20 MG tablet Take 1 tablet (20 mg total) by mouth 2 (two) times daily. Patient taking differently: Take 20 mg by mouth 2 (two) times daily as needed for heartburn or indigestion.  12/30/15   Nelva Bush, MD  gabapentin (NEURONTIN) 100 MG capsule  03/04/16   Historical Provider, MD  gabapentin (NEURONTIN) 300 MG capsule Take 300 mg by mouth daily as needed (for pain).  12/11/15   Historical Provider, MD  insulin aspart (NOVOLOG) 100 UNIT/ML injection Inject 8 Units into the skin 3 (three) times daily with meals. 03/05/16   Golden Circle, FNP  insulin glargine (LANTUS) 100 UNIT/ML injection Inject 0.28 mLs (28 Units total) into the skin at bedtime. 03/05/16   Golden Circle, FNP  Insulin Pen Needle (BD PEN NEEDLE NANO U/F) 32G X 4 MM MISC Use 1 needle per injection to inject insulin 4x daily as instructed. 03/05/16   Golden Circle, FNP  lidocaine (LIDODERM) 5 % Place 1 patch onto the  skin daily as needed (pain). Remove after 12 hours. 10/30/15   Historical Provider, MD  Oxycodone HCl 10 MG TABS Take 10 mg by mouth every 6 (six) hours as needed for pain. 12/16/15   Historical Provider, MD  oxyCODONE-acetaminophen (PERCOCET/ROXICET) 5-325 MG tablet TK 1 T PO EVERY 4 HOURS PRF SEVERE PAIN 01/03/16   Historical Provider, MD  oxyCODONE-acetaminophen (PERCOCET/ROXICET) 5-325 MG tablet Take 1 tablet by mouth every 4 (four) hours as needed for severe pain. 03/09/16   Newt Minion, MD  rivaroxaban (XARELTO) 20 MG TABS tablet Take 1 tablet (20 mg total) by mouth daily with supper. 03/05/16   Golden Circle, FNP    Family History Family History  Problem Relation Age  of Onset  . Cancer Father     lymphoma  . Nephrolithiasis Father   . Vascular Disease Father   . Alcohol abuse Father   . Drug abuse Father   . Cancer Maternal Grandmother     colon  . Hypertension Mother   . Heart disease Mother   . Cataracts Mother   . Depression Mother   . Diabetes Brother   . Drug abuse Brother   . Mental illness Maternal Grandfather   . Cancer Maternal Grandfather   . Heart disease Paternal Grandmother   . Heart disease Paternal Grandfather   . Suicidality Maternal Uncle     Social History Social History  Substance Use Topics  . Smoking status: Never Smoker  . Smokeless tobacco: Never Used  . Alcohol use Yes     Comment: Socially     Allergies   Cephalexin; Ibuprofen; and Meloxicam   Review of Systems Review of Systems  Constitutional: Negative for chills and fever.  Skin: Negative for wound.     Physical Exam Updated Vital Signs BP 168/81 (BP Location: Left Arm)   Pulse 95   Temp 98.5 F (36.9 C) (Oral)   Resp 18   Ht 5\' 4"  (1.626 m)   Wt 77.1 kg   SpO2 100%   BMI 29.18 kg/m   Physical Exam  Constitutional: She appears well-developed and well-nourished.  Well appearing  HENT:  Head: Normocephalic and atraumatic.  Nose: Nose normal.  Eyes: Conjunctivae  and EOM are normal.  Neck: Normal range of motion.  Cardiovascular: Normal rate.   Pulmonary/Chest: Effort normal. No respiratory distress.  Normal work of breathing. No respiratory distress noted.   Abdominal: Soft.  Musculoskeletal: Normal range of motion.  Neurological: She is alert.  Skin: Skin is warm.  Psychiatric: She has a normal mood and affect. Her behavior is normal.  Nursing note and vitals reviewed.    ED Treatments / Results  Labs (all labs ordered are listed, but only abnormal results are displayed) Labs Reviewed - No data to display  EKG  EKG Interpretation None       Radiology No results found.  Procedures Procedures (including critical care time)  Medications Ordered in ED Medications - No data to display   Initial Impression / Assessment and Plan / ED Course  I have reviewed the triage vital signs and the nursing notes.  Pertinent labs & imaging results that were available during my care of the patient were reviewed by me and considered in my medical decision making (see chart for details).    Pt here for refill of pain medication. Medication is a controlled substance. Patient instructed that we cannot prescribe controlled substance as a medication refill here. Discussed need to follow up with PCP in 2-3 days. Patient has no other complaints. She still states that she has refills for all her other medications such as insulin, atorvastatin, and Xarelto and does not need any at this time. Pt is safe for discharge at this time.   Final Clinical Impressions(s) / ED Diagnoses   Final diagnoses:  Medication refill    New Prescriptions New Prescriptions   No medications on file     [place], [place] 03/21/16 [place], MD 03/23/16 0002

## 2016-03-21 NOTE — Discharge Instructions (Signed)
Call your pain management doctor on Monday regarding today's visit. [facility] as we discussed.

## 2016-04-03 NOTE — Progress Notes (Deleted)
Left foot lisfranc ORIF 01/03/16  Patient is a 46 y.o female here for follow up of left foot ORIF Lisfranc joint with gastrocnemius recession on 01/03/16. She is pending hematology appointment for recent DVT, was previously taking Xarleto daily. She is currently taking percocet for her pain.

## 2016-04-06 ENCOUNTER — Ambulatory Visit (INDEPENDENT_AMBULATORY_CARE_PROVIDER_SITE_OTHER): Payer: Medicare PPO | Admitting: Orthopedic Surgery

## 2016-04-12 ENCOUNTER — Emergency Department (HOSPITAL_COMMUNITY): Payer: Medicare PPO

## 2016-04-12 ENCOUNTER — Emergency Department (HOSPITAL_COMMUNITY)
Admission: EM | Admit: 2016-04-12 | Discharge: 2016-04-13 | Disposition: A | Discharge status: Home / Self Care | Payer: Medicare PPO | Attending: Emergency Medicine | Admitting: Emergency Medicine

## 2016-04-12 ENCOUNTER — Encounter (HOSPITAL_COMMUNITY): Payer: Self-pay

## 2016-04-12 DIAGNOSIS — Z7901 Long term (current) use of anticoagulants: Secondary | ICD-10-CM | POA: Diagnosis not present

## 2016-04-12 DIAGNOSIS — R42 Dizziness and giddiness: Secondary | ICD-10-CM | POA: Diagnosis not present

## 2016-04-12 DIAGNOSIS — E10319 Type 1 diabetes mellitus with unspecified diabetic retinopathy without macular edema: Secondary | ICD-10-CM | POA: Insufficient documentation

## 2016-04-12 DIAGNOSIS — R072 Precordial pain: Secondary | ICD-10-CM | POA: Diagnosis not present

## 2016-04-12 DIAGNOSIS — E1065 Type 1 diabetes mellitus with hyperglycemia: Secondary | ICD-10-CM | POA: Diagnosis not present

## 2016-04-12 DIAGNOSIS — R0602 Shortness of breath: Secondary | ICD-10-CM

## 2016-04-12 DIAGNOSIS — R002 Palpitations: Secondary | ICD-10-CM

## 2016-04-12 DIAGNOSIS — Z8673 Personal history of transient ischemic attack (TIA), and cerebral infarction without residual deficits: Secondary | ICD-10-CM | POA: Insufficient documentation

## 2016-04-12 DIAGNOSIS — R51 Headache: Secondary | ICD-10-CM | POA: Diagnosis not present

## 2016-04-12 DIAGNOSIS — E1040 Type 1 diabetes mellitus with diabetic neuropathy, unspecified: Secondary | ICD-10-CM | POA: Insufficient documentation

## 2016-04-12 DIAGNOSIS — R519 Headache, unspecified: Secondary | ICD-10-CM

## 2016-04-12 LAB — CBC
HCT: 42.3 % (ref 36.0–46.0)
Hemoglobin: 14.6 g/dL (ref 12.0–15.0)
MCH: 32.3 pg (ref 26.0–34.0)
MCHC: 34.5 g/dL (ref 30.0–36.0)
MCV: 93.6 fL (ref 78.0–100.0)
Platelets: 393 10*3/uL (ref 150–400)
RBC: 4.52 MIL/uL (ref 3.87–5.11)
RDW: 13.2 % (ref 11.5–15.5)
WBC: 16.3 10*3/uL — ABNORMAL HIGH (ref 4.0–10.5)

## 2016-04-12 LAB — COMPREHENSIVE METABOLIC PANEL
ALT: 24 U/L (ref 14–54)
AST: 18 U/L (ref 15–41)
Albumin: 3.5 g/dL (ref 3.5–5.0)
Alkaline Phosphatase: 138 U/L — ABNORMAL HIGH (ref 38–126)
Anion gap: 13 (ref 5–15)
BUN: 7 mg/dL (ref 6–20)
CO2: 27 mmol/L (ref 22–32)
Calcium: 9.4 mg/dL (ref 8.9–10.3)
Chloride: 98 mmol/L — ABNORMAL LOW (ref 101–111)
Creatinine, Ser: 0.91 mg/dL (ref 0.44–1.00)
GFR calc Af Amer: >60 mL/min (ref >60)
GFR calc non Af Amer: >60 mL/min (ref >60)
Glucose, Bld: 246 mg/dL — ABNORMAL HIGH (ref 65–99)
Potassium: 4 mmol/L (ref 3.5–5.1)
Sodium: 138 mmol/L (ref 135–145)
Total Bilirubin: 0.9 mg/dL (ref 0.3–1.2)
Total Protein: 7.8 g/dL (ref 6.5–8.1)

## 2016-04-12 LAB — I-STAT BETA HCG BLOOD, ED (MC, WL, AP ONLY): I-stat hCG, quantitative: <5 m[IU]/mL (ref <5)

## 2016-04-12 LAB — I-STAT TROPONIN, ED: Troponin i, poc: 0 ng/mL (ref 0.00–0.08)

## 2016-04-12 LAB — CBG MONITORING, ED: Glucose-Capillary: 252 mg/dL — ABNORMAL HIGH (ref 65–99)

## 2016-04-12 MED ORDER — SODIUM CHLORIDE 0.9 % IV BOLUS (SEPSIS)
1000.0000 mL | Freq: Once | INTRAVENOUS | Status: AC
  Administered 2016-04-12: 1000 mL via INTRAVENOUS

## 2016-04-12 MED ORDER — MECLIZINE HCL 25 MG PO TABS
25.0000 mg | ORAL_TABLET | Freq: Once | ORAL | Status: AC
  Administered 2016-04-12: 25 mg via ORAL
  Filled 2016-04-12: qty 1

## 2016-04-12 MED ORDER — IOPAMIDOL (ISOVUE-370) INJECTION 76%
INTRAVENOUS | Status: AC
  Administered 2016-04-12: 100 mL
  Filled 2016-04-12: qty 100

## 2016-04-12 NOTE — ED Notes (Signed)
Patient transported to X-ray 

## 2016-04-12 NOTE — ED Provider Notes (Signed)
[facility] DEPT Provider Note   CSN: [id number] Arrival date & time: 04/12/16  2038     History   Chief Complaint Chief Complaint  Patient presents with  . Chest Pain  . Hyperglycemia    HPI Maureen [name] is a 46 y.o. female.  Maureen [name] is a 46 y.o. Female with a history of type 1 diabetes, previous stroke, previous PE, and hyperlipidemia who presents to the emergency department complaining of palpitations since yesterday and chest pain and shortness of breath starting around 1 PM today. Patient reports central chest pain that radiates to her back since 1 PM today with associated shortness of breath. She also reports intermittent palpitations since yesterday. She also complains of dizziness that is worse with head position. She reports room spinning dizziness today. Patient has a history of a pulmonary embolism and has previously been on Xarelto. She reports she is not currently taking Xarelto due to its cost. She last was diagnosed with a DVT 3 months ago she says. She also complains of a mild headache. Patient denies fevers, recent illness, cough, wheezing, abdominal pain, nausea, vomiting, numbness, tingling, weakness, neck pain, double vision, syncope or rashes. Patient had an echocardiogram and a cardiac stress test in October of this past year that was unremarkable.   The history is provided by the patient and medical records. No language interpreter was used.  Chest Pain   Associated symptoms include dizziness, headaches, palpitations and shortness of breath. Pertinent negatives include no abdominal pain, no back pain, no cough, no fever, no nausea, no numbness, no vomiting and no weakness.  Hyperglycemia  Associated symptoms: chest pain, dizziness and shortness of breath   Associated symptoms: no abdominal pain, no dysuria, no fever, no nausea, no vomiting and no weakness     Past Medical History:  Diagnosis Date  . Abdominal pain   . Achilles tendon  contracture, left    with lisfrabc fracture  . Anxiety   . Arthritis   . Biliary dyskinesia   . Cervical strain   . Closed head injury 2010  . Depression   . Elevated LFTs   . Fibromyalgia   . GERD (gastroesophageal reflux disease)   . History of kidney stones   . Hyperlipidemia   . Hypoglycemia   . Joint pain   . Major depressive disorder   . Migraine    hx of none recent  . Nausea & vomiting   . Pneumonia   . Post traumatic stress disorder (PTSD)   . Pulmonary embolism ([place])   . Renal stones   . Skin rash    due to medications  . Sleep trouble   . Stroke [facility])    " series of mini strokes around 2007"  . TIA (transient ischemic attack)   . Wears glasses   . Well controlled type 1 diabetes mellitus with peripheral neuropathy ([place])    Type 1    Patient Active Problem List   Diagnosis Date Noted  . Acute deep vein thrombosis (DVT) of femoral vein of left lower extremity ([place]) 02/10/2016  . Diabetic polyneuropathy associated with type 2 diabetes mellitus ([place]) 02/10/2016  . Lisfranc dislocation, left, sequela 12/16/2015  . Short Achilles tendon (acquired), left ankle 12/16/2015  . Chest pain with moderate risk for cardiac etiology 11/29/2015  . Elevated troponin 11/19/2015  . Migraine headache 11/19/2015  . Neck pain 11/19/2015  . Acute kidney injury ([place]) 02/19/2015  . DKA (diabetic ketoacidoses) ([place]) 10/24/2014  . HLD (hyperlipidemia)  10/24/2014  . Abnormal LFTs 09/03/2014  . Dizziness 09/03/2014  . Acute pulmonary embolism ([place]) 09/03/2014  . Transaminitis 09/02/2014  . GAD (generalized anxiety disorder) 11/23/2013  . PTSD (post-traumatic stress disorder) 11/23/2013  . Severe major depression without psychotic features ([place]) 10/10/2013  . Endometriosis 12/08/2011  . Arthritis of left knee 12/08/2011  . Diabetic retinopathy ([place]) 12/08/2011  . Fibromyalgia 10/23/2011  . DM type 1 (diabetes mellitus, type 1) ([place]) 10/13/2011    Past Surgical History:    Procedure Laterality Date  . ABDOMINAL HYSTERECTOMY  2001  . APPENDECTOMY    . [place]  . CHOLECYSTECTOMY  02/10/2011   Procedure: LAPAROSCOPIC CHOLECYSTECTOMY WITH INTRAOPERATIVE CHOLANGIOGRAM;  Surgeon: Judieth Keens, DO;  Location: [facility];  Service: General;  Laterality: N/A;  . COLONOSCOPY    . DILATION AND CURETTAGE OF UTERUS    . exploratory laparomty  Mar 04, 2012  . EYE SURGERY  2006   laser eye surgery left eye  . KNEE SURGERY Left 2012  . LAPAROSCOPIC APPENDECTOMY N/A 10/12/2012   Procedure: DIAGNOSTIC APPENDECTOMY LAPAROSCOPIC;  Surgeon: Madilyn Hook, DO;  Location: [facility];  Service: General;  Laterality: N/A;  . lipoma removed  yrs ago  . LIVER BIOPSY    . ORIF ANKLE FRACTURE Left 01/03/2016   Procedure: OPEN REDUCTION INTERNAL FIXATION (ORIF) LEFT LISFRANC JOINT, GASTROC RECESSION;  Surgeon: Newt Minion, MD;  Location: [facility];  Service: Orthopedics;  Laterality: Left;  . ROBOTIC ASSISTED LAPAROSCOPIC LYSIS OF ADHESION N/A 03/04/2012   Procedure: ROBOTIC ASSISTED LAPAROSCOPIC LYSIS OF EXTENSIVE ADHESIONS;  Surgeon: Claiborne Billings A. Pamala Hurry, MD;  Location: [facility];  Service: Gynecology;  Laterality: N/A;  . TONSILLECTOMY  2001    OB History    No data available       Home Medications    Prior to Admission medications   Medication Sig Start Date End Date Taking? Authorizing Provider  acetaminophen (TYLENOL) 500 MG tablet Take 1 tablet (500 mg total) by mouth every 8 (eight) hours as needed for headache. 11/20/15  Yes Mauricio Gerome Apley, MD  atorvastatin (LIPITOR) 40 MG tablet Take 40 mg by mouth at bedtime.    Yes Historical Provider, MD  famotidine (PEPCID) 20 MG tablet Take 1 tablet (20 mg total) by mouth 2 (two) times daily. Patient taking differently: Take 20 mg by mouth 2 (two) times daily as needed for heartburn or indigestion.  12/30/15  Yes Christopher End, MD  gabapentin (NEURONTIN) 300 MG capsule Take 300 mg by mouth daily as needed (for  pain).  12/11/15  Yes Historical Provider, MD  insulin aspart (NOVOLOG) 100 UNIT/ML injection Inject 8 Units into the skin 3 (three) times daily with meals. 03/05/16  Yes Golden Circle, FNP  insulin glargine (LANTUS) 100 UNIT/ML injection Inject 0.28 mLs (28 Units total) into the skin at bedtime. Patient taking differently: Inject 30 Units into the skin at bedtime.  03/05/16  Yes Golden Circle, FNP  lidocaine (LIDODERM) 5 % Place 1 patch onto the skin daily as needed (pain). Remove after 12 hours. 10/30/15  Yes Historical Provider, MD  meclizine (ANTIVERT) 25 MG tablet Take 1 tablet (25 mg total) by mouth 3 (three) times daily as needed for dizziness. 04/13/16   Waynetta Pean, PA-C  rivaroxaban (XARELTO) 20 MG TABS tablet Take 1 tablet (20 mg total) by mouth daily with supper. Patient not taking: Reported on 04/12/2016 03/05/16   Golden Circle, [place]    Family History Family  History  Problem Relation Age of Onset  . Cancer Father     lymphoma  . Nephrolithiasis Father   . Vascular Disease Father   . Alcohol abuse Father   . Drug abuse Father   . Cancer Maternal Grandmother     colon  . Hypertension Mother   . Heart disease Mother   . Cataracts Mother   . Depression Mother   . Diabetes Brother   . Drug abuse Brother   . Mental illness Maternal Grandfather   . Cancer Maternal Grandfather   . Heart disease Paternal Grandmother   . Heart disease Paternal Grandfather   . Suicidality Maternal Uncle     Social History Social History  Substance Use Topics  . Smoking status: Never Smoker  . Smokeless tobacco: Never Used  . Alcohol use Yes     Comment: Socially     Allergies   Cephalexin; Ibuprofen; and Meloxicam   Review of Systems Review of Systems  Constitutional: Negative for chills and fever.  HENT: Negative for congestion and sore throat.   Eyes: Negative for visual disturbance.  Respiratory: Positive for shortness of breath. Negative for cough and wheezing.     Cardiovascular: Positive for chest pain and palpitations. Negative for leg swelling.  Gastrointestinal: Negative for abdominal pain, diarrhea, nausea and vomiting.  Genitourinary: Negative for dysuria, frequency and urgency.  Musculoskeletal: Negative for back pain and neck pain.  Skin: Negative for rash.  Neurological: Positive for dizziness and headaches. Negative for syncope, speech difficulty, weakness, light-headedness and numbness.     Physical Exam Updated Vital Signs BP (!) 151/61   Pulse 79   Temp 98.5 F (36.9 C) (Oral)   Resp 16   SpO2 100%   Physical Exam  Constitutional: She is oriented to person, place, and time. She appears well-developed and well-nourished. No distress.  Nontoxic appearing.  HENT:  Head: Normocephalic and atraumatic.  Right Ear: External ear normal.  Left Ear: External ear normal.  Mouth/Throat: Oropharynx is clear and moist.  No temporal edema or tenderness.  Eyes: Conjunctivae and EOM are normal. Pupils are equal, round, and reactive to light. Right eye exhibits no discharge. Left eye exhibits no discharge.  Neck: Normal range of motion. Neck supple. No JVD present.  Cardiovascular: Normal rate, regular rhythm, normal heart sounds and intact distal pulses.  Exam reveals no gallop and no friction rub.   No murmur heard. Bilateral radial and dorsalis pedis pulses are intact.  Pulmonary/Chest: Effort normal and breath sounds normal. No stridor. No respiratory distress. She has no wheezes. She has no rales.  Lungs clear to auscultation bilaterally. Symmetric chest expansion bilaterally.  Abdominal: Soft. There is no tenderness. There is no guarding.  Abdomen is soft and nontender palpation.  Musculoskeletal: She exhibits no edema or tenderness.  No lower extremity edema or tenderness.  Lymphadenopathy:    She has no cervical adenopathy.  Neurological: She is alert and oriented to person, place, and time. No cranial nerve deficit or sensory  deficit. She exhibits normal muscle tone. Coordination normal.  The patient is alert and oriented 3. Cranial nerves are intact. Speech is clear and coherent. No pronator drift. Finger-to-nose intact bilaterally. Sensation is intact in her bilateral upper and lower extremities. Normal gait.   Skin: Skin is warm and dry. Capillary refill takes less than 2 seconds. No rash noted. She is not diaphoretic. No erythema. No pallor.  Psychiatric: Her behavior is normal. Her mood appears anxious.  Appears anxious.  Nursing note and vitals reviewed.    ED Treatments / Results  Labs (all labs ordered are listed, but only abnormal results are displayed) Labs Reviewed  CBC - Abnormal; Notable for the following:       Result Value   WBC 16.3 (*)    All other components within normal limits  COMPREHENSIVE METABOLIC PANEL - Abnormal; Notable for the following:    Chloride 98 (*)    Glucose, Bld 246 (*)    Alkaline Phosphatase 138 (*)    All other components within normal limits  CBG MONITORING, ED - Abnormal; Notable for the following:    Glucose-Capillary 252 (*)    All other components within normal limits  I-STAT TROPOININ, ED  I-STAT BETA HCG BLOOD, ED ([facility], [facility], [facility] ONLY)    EKG  EKG Interpretation  Date/Time:  Sunday April 12 2016 20:47:50 EDT Ventricular Rate:  92 PR Interval:  134 QRS Duration: 80 QT Interval:  344 QTC Calculation: 425 R Axis:   92 Text Interpretation:  Normal sinus rhythm Rightward axis Borderline ECG Confirmed by Lacinda Axon  MD, BRIAN ([id number]) on 04/12/2016 10:04:27 PM       Radiology Dg Chest 2 View  Result Date: 04/12/2016 CLINICAL DATA:  Central chest pain and pressure. Shortness of breath. Symptom onset today. History of pulmonary embolus. EXAM: CHEST  2 VIEW COMPARISON:  Radiographs and CT 03/02/2016, additional priors. FINDINGS: The cardiomediastinal contours are normal. The lungs are clear. Pulmonary vasculature is normal. No consolidation, pleural effusion, or  pneumothorax. No acute osseous abnormalities are seen. Instance note of left cervical rib. IMPRESSION: No acute pulmonary process. Electronically Signed   By: Jeb Levering M.D.   On: 04/12/2016 21:47   Ct Angio Chest Pe W/cm &/or Wo Cm  Result Date: 04/13/2016 CLINICAL DATA:  Acute onset of shortness of breath and dizziness. Generalized chest pain. Initial encounter. EXAM: CT ANGIOGRAPHY CHEST WITH CONTRAST TECHNIQUE: Multidetector CT imaging of the chest was performed using the standard protocol during bolus administration of intravenous contrast. Multiplanar CT image reconstructions and MIPs were obtained to evaluate the vascular anatomy. CONTRAST:  80 mL of Isovue 370 IV contrast COMPARISON:  Chest radiograph performed earlier today at 9:26 p.m., and CTA of the chest performed 03/02/2016 FINDINGS: Cardiovascular:  There is no evidence of pulmonary embolus. The heart is normal in size. The thoracic aorta is unremarkable in appearance. The great vessels are grossly unremarkable. Mediastinum/Nodes: The mediastinum is unremarkable in appearance. No mediastinal lymphadenopathy is seen. No pericardial effusion is identified. The thyroid gland is unremarkable. No axillary lymphadenopathy is appreciated. Lungs/Pleura: The lungs are clear bilaterally. No focal consolidation, pleural effusion or pneumothorax is seen. No masses are seen. Upper Abdomen: The visualized portions of the liver and the spleen are unremarkable in appearance. The patient is status post cholecystectomy, with clips noted at the gallbladder fossa. The visualized portions of the pancreas, adrenal glands and kidneys are within normal limits. Musculoskeletal: No acute osseous abnormalities are identified. The visualized musculature is unremarkable in appearance. Review of the MIP images confirms the above findings. IMPRESSION: 1. No evidence of pulmonary embolus. 2. Lungs clear bilaterally. Electronically Signed   By: Garald Balding M.D.   On:  04/13/2016 00:24    Procedures Procedures (including critical care time)  Medications Ordered in ED Medications  sodium chloride 0.9 % bolus 1,000 mL (0 mLs Intravenous Stopped 04/13/16 0046)  meclizine (ANTIVERT) tablet 25 mg (25 mg Oral Given 04/12/16 2236)  iopamidol (ISOVUE-370) 76 % injection (  100 mLs  Contrast Given 04/12/16 2353)     Initial Impression / Assessment and Plan / ED Course  I have reviewed the triage vital signs and the nursing notes.  Pertinent labs & imaging results that were available during my care of the patient were reviewed by me and considered in my medical decision making (see chart for details).    This is a 46 y.o. Female with a history of type 1 diabetes, previous stroke, previous PE, and hyperlipidemia who presents to the emergency department complaining of palpitations since yesterday and chest pain and shortness of breath starting around 1 PM today. Patient reports central chest pain that radiates to her back since 1 PM today with associated shortness of breath. She also reports intermittent palpitations since yesterday. She also complains of dizziness that is worse with head position. She reports room spinning dizziness today. Patient has a history of a pulmonary embolism and has previously been on Xarelto. She reports she is not currently taking Xarelto due to its cost. She last was diagnosed with a DVT 3 months ago she says. She also complains of a mild headache. On exam the patient is afebrile and nontoxic appearing. She has no focal neurological deficits on exam. She has normal gait. Lungs are clear to auscultation bilaterally. No increased work of breathing. No hypoxia or tachypnea. No lower extremity edema or tenderness.  Troponin is not elevated. As troponin is obtained greater than 10 hours after onset of symptoms, I see no need for delta troponin at this time. EKG shows concerning findings. CBC is remarkable for white count of 16,000. CMP is remarkable  for a glucose of 246 with a normal anion gap. Chest x-ray is unremarkable.  Will provide a fluid bolus, meclizine for her room spinning dizziness and obtain CT angiogram of her chest to rule out PE with patient's history of PE and DVT. Patient agrees with plan.  CT angiogram of her chest shows no evidence of PE.  At reevaluation patient reports she is feeling better. She does report resolution of her headache and dizziness, however it returned after she ambulated to the bathroom. She reports still having some palpitations. She ambulates in the room with normal gait and has no neuro deficits on exam. I provided reassurance and feel the patient is safe to be discharged at this time. Patient has had previous MRIs without findings and she has no deficits on exam. Suspect a component of vertigo and/or anxiety. I discussed strict and specific return precautions with the patient. I advised the patient to follow-up with their primary care provider this week. I advised the patient to return to the emergency department with new or worsening symptoms or new concerns. The patient verbalized understanding and agreement with plan.    This patient was discussed with Dr. Christy Gentles who agrees with assessment and plan.   Final Clinical Impressions(s) / ED Diagnoses   Final diagnoses:  Precordial pain  Shortness of breath  Bad headache  Vertigo  Palpitations    New Prescriptions New Prescriptions   MECLIZINE (ANTIVERT) 25 MG TABLET    Take 1 tablet (25 mg total) by mouth 3 (three) times daily as needed for dizziness.     Waynetta Pean, PA-C 04/13/16 4503    Ripley Fraise, MD 04/14/16 [id number]

## 2016-04-12 NOTE — ED Triage Notes (Signed)
Pt complaining of central chest pain that radiates to back x 2 days. Pt also complaining of palpitations. Pt states type 1 diabetic, not taking insulin as rx'd. Pt states CBG been high recently.

## 2016-04-12 NOTE — ED Notes (Signed)
Patient transported to CT 

## 2016-04-13 ENCOUNTER — Encounter (HOSPITAL_COMMUNITY): Payer: Self-pay | Admitting: Emergency Medicine

## 2016-04-13 ENCOUNTER — Emergency Department (HOSPITAL_BASED_OUTPATIENT_CLINIC_OR_DEPARTMENT_OTHER)
Admit: 2016-04-13 | Discharge: 2016-04-13 | Disposition: A | Discharge status: Home / Self Care | Payer: Medicare PPO | Attending: Emergency Medicine | Admitting: Emergency Medicine

## 2016-04-13 ENCOUNTER — Emergency Department (HOSPITAL_COMMUNITY)
Admission: EM | Admit: 2016-04-13 | Discharge: 2016-04-13 | Disposition: A | Discharge status: Home / Self Care | Payer: Medicare PPO | Source: Home / Self Care | Attending: Emergency Medicine | Admitting: Emergency Medicine

## 2016-04-13 DIAGNOSIS — M79662 Pain in left lower leg: Secondary | ICD-10-CM

## 2016-04-13 DIAGNOSIS — M79609 Pain in unspecified limb: Secondary | ICD-10-CM

## 2016-04-13 DIAGNOSIS — E1065 Type 1 diabetes mellitus with hyperglycemia: Secondary | ICD-10-CM

## 2016-04-13 DIAGNOSIS — R739 Hyperglycemia, unspecified: Secondary | ICD-10-CM

## 2016-04-13 DIAGNOSIS — Z79899 Other long term (current) drug therapy: Secondary | ICD-10-CM | POA: Insufficient documentation

## 2016-04-13 DIAGNOSIS — Z7901 Long term (current) use of anticoagulants: Secondary | ICD-10-CM

## 2016-04-13 DIAGNOSIS — Z8673 Personal history of transient ischemic attack (TIA), and cerebral infarction without residual deficits: Secondary | ICD-10-CM

## 2016-04-13 DIAGNOSIS — E1040 Type 1 diabetes mellitus with diabetic neuropathy, unspecified: Secondary | ICD-10-CM | POA: Insufficient documentation

## 2016-04-13 DIAGNOSIS — M79605 Pain in left leg: Secondary | ICD-10-CM

## 2016-04-13 LAB — CBG MONITORING, ED
Glucose-Capillary: 144 mg/dL — ABNORMAL HIGH (ref 65–99)
Glucose-Capillary: 230 mg/dL — ABNORMAL HIGH (ref 65–99)
Glucose-Capillary: 305 mg/dL — ABNORMAL HIGH (ref 65–99)

## 2016-04-13 MED ORDER — ACETAMINOPHEN 325 MG PO TABS
650.0000 mg | ORAL_TABLET | Freq: Once | ORAL | Status: AC
  Administered 2016-04-13: 650 mg via ORAL
  Filled 2016-04-13: qty 2

## 2016-04-13 MED ORDER — ONDANSETRON 4 MG PO TBDP
8.0000 mg | ORAL_TABLET | Freq: Once | ORAL | Status: AC
  Administered 2016-04-13: 8 mg via ORAL
  Filled 2016-04-13: qty 2

## 2016-04-13 MED ORDER — MECLIZINE HCL 25 MG PO TABS: 25.0000 mg | ORAL_TABLET | Freq: Three times a day (TID) | ORAL | 0 refills | Status: DC | PRN

## 2016-04-13 NOTE — ED Notes (Signed)
Patient transported to CT 

## 2016-04-13 NOTE — ED Notes (Signed)
Pt to Vascular study for left lower ext [place]

## 2016-04-13 NOTE — ED Notes (Signed)
Pt did not need anything at this time  

## 2016-04-13 NOTE — ED Notes (Signed)
Returned from [facility]

## 2016-04-13 NOTE — ED Notes (Signed)
Pt understood dc material. NAD noted. Script given at dc  

## 2016-04-13 NOTE — ED Provider Notes (Signed)
[facility] DEPT Provider Note   CSN: [id number] Arrival date & time: 04/13/16  0354    History   Chief Complaint Chief Complaint  Patient presents with  . Emesis  . Leg Pain    HPI Maureen [name] is a 46 y.o. female.  The history is provided by the patient.  Emesis   This is a new problem. Episode onset: just prior to arrival. The problem has been gradually worsening. There has been no fever. Associated symptoms include arthralgias and headaches. Pertinent negatives include no fever.  Leg Pain   This is a new problem. The current episode started 1 to 2 hours ago. The problem occurs constantly. The problem has been gradually worsening. The pain is present in the left lower leg. The pain is moderate. She has tried nothing for the symptoms.  Patient with h/o DVT (not on anticoagulants) presents with acute episode of nausea/vomiting as well as acute onset of left LE pain (denies trauma) She was seen in ED last night for chest pain, had evaluation including CT Chest that was negative for PE.  She also had HA and dizziness during the ED visit but was deemed appropriate for d/c home. After going home she had vomiting and also left LE pain  She reports the pain feels like prior DVT She was diagnosed with DVT back in January, started on xarelto but unable to continue due to cost  She is still having HA/dizziness and chest pain which she was evaluated for on previous ED visit.  No other acute complaints   Past Medical History:  Diagnosis Date  . Abdominal pain   . Achilles tendon contracture, left    with lisfrabc fracture  . Anxiety   . Arthritis   . Biliary dyskinesia   . Cervical strain   . Closed head injury 2010  . Depression   . Elevated LFTs   . Fibromyalgia   . GERD (gastroesophageal reflux disease)   . History of kidney stones   . Hyperlipidemia   . Hypoglycemia   . Joint pain   . Major depressive disorder   . Migraine    hx of none recent  . Nausea &  vomiting   . Pneumonia   . Post traumatic stress disorder (PTSD)   . Pulmonary embolism ([place])   . Renal stones   . Skin rash    due to medications  . Sleep trouble   . Stroke [facility])    " series of mini strokes around 2007"  . TIA (transient ischemic attack)   . Wears glasses   . 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 Acute deep vein thrombosis (DVT) of femoral vein of left lower extremity ([place]) 02/10/2016  . Diabetic polyneuropathy associated with type 2 diabetes mellitus ([place]) 02/10/2016  . Lisfranc dislocation, left, sequela 12/16/2015  . Short Achilles tendon (acquired), left ankle 12/16/2015  . Chest pain with moderate risk for cardiac etiology 11/29/2015  . Elevated troponin 11/19/2015  . Migraine headache 11/19/2015  . Neck pain 11/19/2015  . Acute kidney injury ([place]) 02/19/2015  . DKA (diabetic ketoacidoses) ([place]) 10/24/2014  . HLD (hyperlipidemia) 10/24/2014  . Abnormal LFTs 09/03/2014  . Dizziness 09/03/2014  . Acute pulmonary embolism ([place]) 09/03/2014  . Transaminitis 09/02/2014  . GAD (generalized anxiety disorder) 11/23/2013  . PTSD (post-traumatic stress disorder) 11/23/2013  . Severe major depression without  psychotic features ([place]) 10/10/2013  . Endometriosis 12/08/2011  . Arthritis of left knee 12/08/2011  . Diabetic retinopathy ([place]) 12/08/2011  . Fibromyalgia 10/23/2011  . DM type 1 (diabetes mellitus, type 1) ([place]) 10/13/2011    Past Surgical History:  Procedure Laterality Date  . ABDOMINAL HYSTERECTOMY  2001  . APPENDECTOMY    . [place]  . CHOLECYSTECTOMY  02/10/2011   Procedure: LAPAROSCOPIC CHOLECYSTECTOMY WITH INTRAOPERATIVE CHOLANGIOGRAM;  Surgeon: Judieth Keens, DO;  Location: [facility];  Service: General;  Laterality: N/A;  . COLONOSCOPY    . DILATION AND CURETTAGE OF UTERUS    . exploratory laparomty  Mar 04, 2012  .  EYE SURGERY  2006   laser eye surgery left eye  . KNEE SURGERY Left 2012  . LAPAROSCOPIC APPENDECTOMY N/A 10/12/2012   Procedure: DIAGNOSTIC APPENDECTOMY LAPAROSCOPIC;  Surgeon: Madilyn Hook, DO;  Location: [facility];  Service: General;  Laterality: N/A;  . lipoma removed  yrs ago  . LIVER BIOPSY    . ORIF ANKLE FRACTURE Left 01/03/2016   Procedure: OPEN REDUCTION INTERNAL FIXATION (ORIF) LEFT LISFRANC JOINT, GASTROC RECESSION;  Surgeon: Newt Minion, MD;  Location: [facility];  Service: Orthopedics;  Laterality: Left;  . ROBOTIC ASSISTED LAPAROSCOPIC LYSIS OF ADHESION N/A 03/04/2012   Procedure: ROBOTIC ASSISTED LAPAROSCOPIC LYSIS OF EXTENSIVE ADHESIONS;  Surgeon: Claiborne Billings A. Pamala Hurry, MD;  Location: [facility];  Service: Gynecology;  Laterality: N/A;  . TONSILLECTOMY  2001    OB History    No data available       Home Medications    Prior to Admission medications   Medication Sig Start Date End Date Taking? Authorizing Provider  acetaminophen (TYLENOL) 500 MG tablet Take 1 tablet (500 mg total) by mouth every 8 (eight) hours as needed for headache. 11/20/15  Yes Mauricio Gerome Apley, MD  atorvastatin (LIPITOR) 40 MG tablet Take 40 mg by mouth at bedtime.    Yes Historical Provider, MD  famotidine (PEPCID) 20 MG tablet Take 1 tablet (20 mg total) by mouth 2 (two) times daily. Patient taking differently: Take 20 mg by mouth 2 (two) times daily as needed for heartburn or indigestion.  12/30/15  Yes Christopher End, MD  gabapentin (NEURONTIN) 300 MG capsule Take 300 mg by mouth daily as needed (for pain).  12/11/15  Yes Historical Provider, MD  insulin aspart (NOVOLOG) 100 UNIT/ML injection Inject 8 Units into the skin 3 (three) times daily with meals. 03/05/16  Yes Golden Circle, FNP  insulin glargine (LANTUS) 100 UNIT/ML injection Inject 0.28 mLs (28 Units total) into the skin at bedtime. Patient taking differently: Inject 30 Units into the skin at bedtime.  03/05/16  Yes Golden Circle, FNP    lidocaine (LIDODERM) 5 % Place 1 patch onto the skin daily as needed (pain). Remove after 12 hours. 10/30/15  Yes Historical Provider, MD  meclizine (ANTIVERT) 25 MG tablet Take 1 tablet (25 mg total) by mouth 3 (three) times daily as needed for dizziness. 04/13/16   Waynetta Pean, PA-C  rivaroxaban (XARELTO) 20 MG TABS tablet Take 1 tablet (20 mg total) by mouth daily with supper. Patient not taking: Reported on 04/12/2016 03/05/16   Golden Circle, FNP    Family History Family History  Problem Relation Age of Onset  . Cancer Father     lymphoma  . Nephrolithiasis Father   . Vascular Disease Father   . Alcohol abuse Father   . Drug abuse Father   .  Cancer Maternal Grandmother     colon  . Hypertension Mother   . Heart disease Mother   . Cataracts Mother   . Depression Mother   . Diabetes Brother   . Drug abuse Brother   . Mental illness Maternal Grandfather   . Cancer Maternal Grandfather   . Heart disease Paternal Grandmother   . Heart disease Paternal Grandfather   . Suicidality Maternal Uncle     Social History Social History  Substance Use Topics  . Smoking status: Never Smoker  . Smokeless tobacco: Never Used  . Alcohol use Yes     Comment: Socially     Allergies   Cephalexin; Ibuprofen; and Meloxicam   Review of Systems Review of Systems  Constitutional: Negative for fever.  Gastrointestinal: Positive for vomiting.  Musculoskeletal: Positive for arthralgias.  Neurological: Positive for headaches.  All other systems reviewed and are negative.    Physical Exam Updated Vital Signs BP (!) 136/56   Pulse 82   Temp 98.2 F (36.8 C) (Oral)   Resp 18   SpO2 99%   Physical Exam CONSTITUTIONAL: Well developed/well nourished HEAD: Normocephalic/atraumatic EYES: EOMI/PERRL, no nystagmus, no ptosis ENMT: Mucous membranes moist NECK: supple no meningeal signs, no bruits CV: S1/S2 noted, no murmurs/rubs/gallops noted LUNGS: Lungs are clear to  auscultation bilaterally, no apparent distress ABDOMEN: soft, nontender, no rebound or guarding GU:no cva tenderness NEURO:Awake/alert, face symmetric, no arm or leg drift is noted Equal 5/5 strength with shoulder abduction, elbow flex/extension, wrist flex/extension in upper extremities and equal hand grips bilaterally Equal 5/5 strength with hip flexion,knee flex/extension, foot dorsi/plantar flexion Cranial nerves 3/4/5/6/07/27/08/11/12 tested and intact Gait normal without ataxia No past pointing Sensation to light touch intact in all extremities EXTREMITIES: pulses normal, full ROM, tenderness to left thigh/calf, no edema/erythema, distal pulses intact, no left knee tenderness/edema/erythema SKIN: warm, color normal PSYCH: no abnormalities of mood noted   ED Treatments / Results  Labs (all labs ordered are listed, but only abnormal results are displayed) Labs Reviewed  CBG MONITORING, ED - Abnormal; Notable for the following:       Result Value   Glucose-Capillary 230 (*)    All other components within normal limits    EKG  EKG Interpretation None       Radiology Dg Chest 2 View  Result Date: 04/12/2016 CLINICAL DATA:  Central chest pain and pressure. Shortness of breath. Symptom onset today. History of pulmonary embolus. EXAM: CHEST  2 VIEW COMPARISON:  Radiographs and CT 03/02/2016, additional priors. FINDINGS: The cardiomediastinal contours are normal. The lungs are clear. Pulmonary vasculature is normal. No consolidation, pleural effusion, or pneumothorax. No acute osseous abnormalities are seen. Instance note of left cervical rib. IMPRESSION: No acute pulmonary process. Electronically Signed   By: Jeb Levering M.D.   On: 04/12/2016 21:47   Ct Angio Chest Pe W/cm &/or Wo Cm  Result Date: 04/13/2016 CLINICAL DATA:  Acute onset of shortness of breath and dizziness. Generalized chest pain. Initial encounter. EXAM: CT ANGIOGRAPHY CHEST WITH CONTRAST TECHNIQUE:  Multidetector CT imaging of the chest was performed using the standard protocol during bolus administration of intravenous contrast. Multiplanar CT image reconstructions and MIPs were obtained to evaluate the vascular anatomy. CONTRAST:  80 mL of Isovue 370 IV contrast COMPARISON:  Chest radiograph performed earlier today at 9:26 p.m., and CTA of the chest performed 03/02/2016 FINDINGS: Cardiovascular:  There is no evidence of pulmonary embolus. The heart is normal in size. The thoracic aorta is unremarkable  in appearance. The great vessels are grossly unremarkable. Mediastinum/Nodes: The mediastinum is unremarkable in appearance. No mediastinal lymphadenopathy is seen. No pericardial effusion is identified. The thyroid gland is unremarkable. No axillary lymphadenopathy is appreciated. Lungs/Pleura: The lungs are clear bilaterally. No focal consolidation, pleural effusion or pneumothorax is seen. No masses are seen. Upper Abdomen: The visualized portions of the liver and the spleen are unremarkable in appearance. The patient is status post cholecystectomy, with clips noted at the gallbladder fossa. The visualized portions of the pancreas, adrenal glands and kidneys are within normal limits. Musculoskeletal: No acute osseous abnormalities are identified. The visualized musculature is unremarkable in appearance. Review of the MIP images confirms the above findings. IMPRESSION: 1. No evidence of pulmonary embolus. 2. Lungs clear bilaterally. Electronically Signed   By: Garald Balding M.D.   On: 04/13/2016 00:24    Procedures Procedures (including critical care time)  Medications Ordered in ED Medications  acetaminophen (TYLENOL) tablet 650 mg (not administered)  ondansetron (ZOFRAN-ODT) disintegrating tablet 8 mg (8 mg Oral Given 04/13/16 0641)     Initial Impression / Assessment and Plan / ED Course  I have reviewed the triage vital signs and the nursing notes.  Pertinent labs  results that were  available during my care of the patient were reviewed by me and considered in my medical decision making (see chart for details).     Pt here for vomiting as well as LE pain She is ambulatory, no focal neuro deficits, will treat nausea As for leg pain, reports pain feels like previous DVT, will need vascular study 7:27 AM At signout to dr Wilson Singer, f/u on DVT study Pt otherwise awake/alert, no distress Reports nausea improved No focal neuro deficits, defer any further neuro workup  Final Clinical Impressions(s) / ED Diagnoses   Final diagnoses:  Hyperglycemia  Left leg pain    New Prescriptions New Prescriptions   No medications on file     Ripley Fraise, MD 04/13/16 [phone]

## 2016-04-13 NOTE — ED Notes (Addendum)
Pt said she did not eat anything CBG 305

## 2016-04-13 NOTE — ED Triage Notes (Signed)
Patient complaining of sudden onset nausea and vomiting coupled with left lower leg pain. States she was seen and discharged after evaluation for multiple other complaints. The vomiting is new. Denies fever.

## 2016-04-13 NOTE — Progress Notes (Signed)
VASCULAR LAB PRELIMINARY  PRELIMINARY  PRELIMINARY  PRELIMINARY  Left lower extremity venous duplex completed.    Preliminary report:  There is no DVT or SVT noted in the left lower extremity.  There is an area noted in the mid calf that could be consistent with muscle disturbance.   Called results to Dr. Augustine Radar, [facility], RVT 04/13/2016, 8:25 AM

## 2016-04-16 ENCOUNTER — Telehealth: Payer: Self-pay | Admitting: *Deleted

## 2016-04-16 NOTE — Telephone Encounter (Signed)
Called and left a message on vm to call back    By Freada Bergeron, [place]

## 2016-04-23 ENCOUNTER — Telehealth: Payer: Self-pay | Admitting: *Deleted

## 2016-04-23 NOTE — Telephone Encounter (Signed)
Called and left a message on cell to call me back

## 2016-04-27 ENCOUNTER — Encounter: Payer: Self-pay | Admitting: Family

## 2016-04-27 DIAGNOSIS — R102 Pelvic and perineal pain: Principal | ICD-10-CM

## 2016-04-27 DIAGNOSIS — G8929 Other chronic pain: Secondary | ICD-10-CM

## 2016-04-28 ENCOUNTER — Encounter: Payer: Self-pay | Admitting: Cardiology

## 2016-04-30 ENCOUNTER — Telehealth: Payer: Self-pay | Admitting: Family

## 2016-04-30 NOTE — Telephone Encounter (Signed)
Patient is currently waiting on referral to go through for pain management.  Did explain to patient that the referral coordinators are working on the referral but the typical turn around time on pain management is around 3 months  Patient will run out of pain medication over the weekend.  She would like to know if Marya Amsler can handle her pain medication until she gets into a clinic.  Patient currently takes oxycodone 5mg  4 x day.  Please follow up in regard.

## 2016-04-30 NOTE — Telephone Encounter (Signed)
Pts last refill was on 04/01/16 of 180 pills according to [place] controlled substance database.

## 2016-05-01 MED ORDER — OXYCODONE HCL 5 MG PO TABS: 5.0000 mg | ORAL_TABLET | Freq: Four times a day (QID) | ORAL | 0 refills | Status: DC | PRN

## 2016-05-01 NOTE — Telephone Encounter (Signed)
Pt is aware that rx is ready for pick up.  

## 2016-05-01 NOTE — Telephone Encounter (Signed)
[place] Controlled Substance Database reviewed with no irregularities. Needs UDS. Will sign Controlled Substance Contract if needed further.

## 2016-05-03 IMAGING — DX DG FOOT COMPLETE 3+V*R*
3 series · 3 of 3 positions shown · non-contrast
Comparison: Right tibia/fibula radiographs performed 06/03/2007

CLINICAL DATA: Hit top of foot on step, with medial foot pain,
radiating to the great toe. Initial encounter.

EXAM:
RIGHT FOOT COMPLETE - 3+ VIEW

[foot ap]
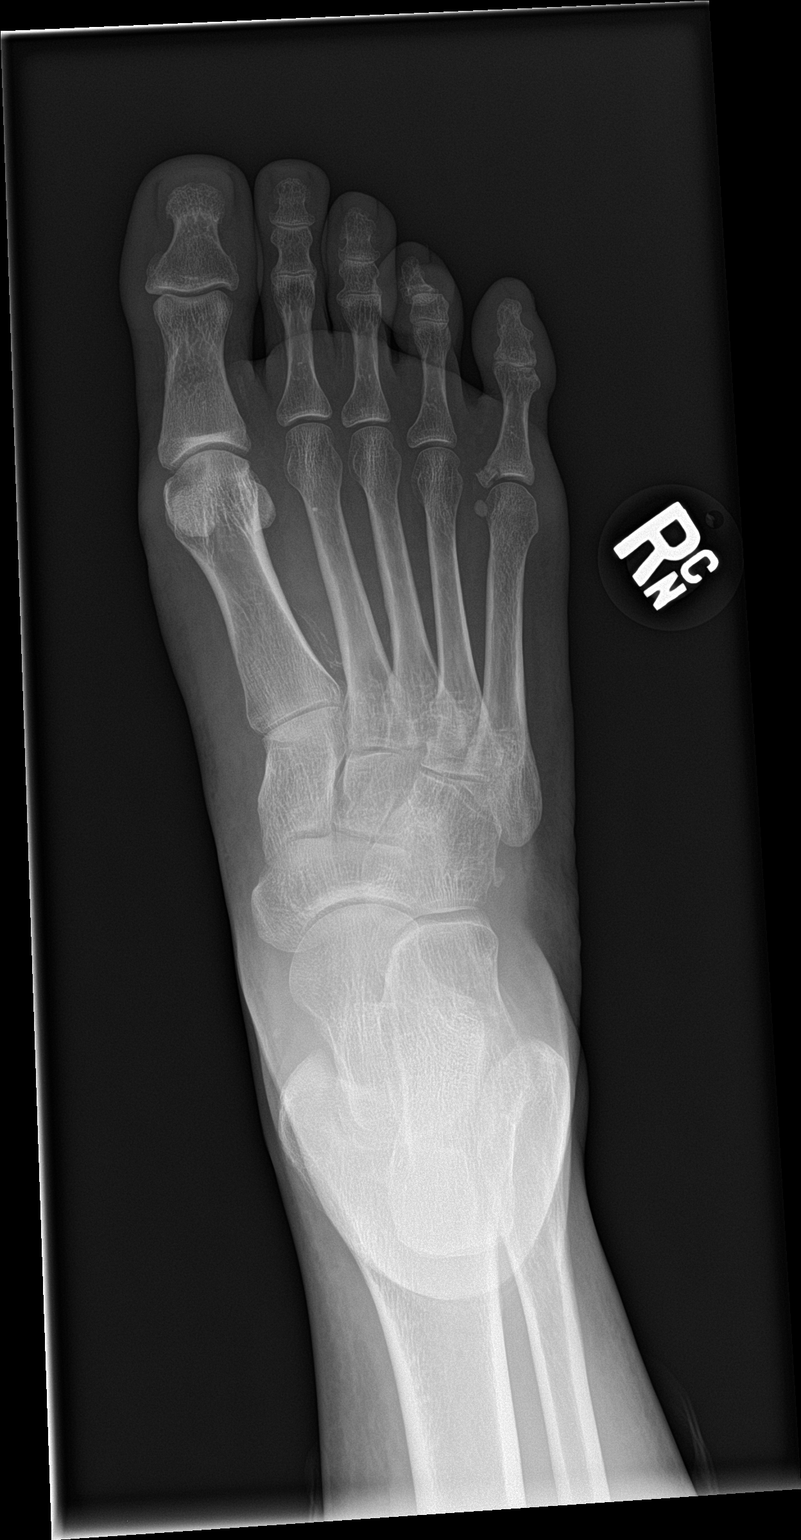

[foot obl]
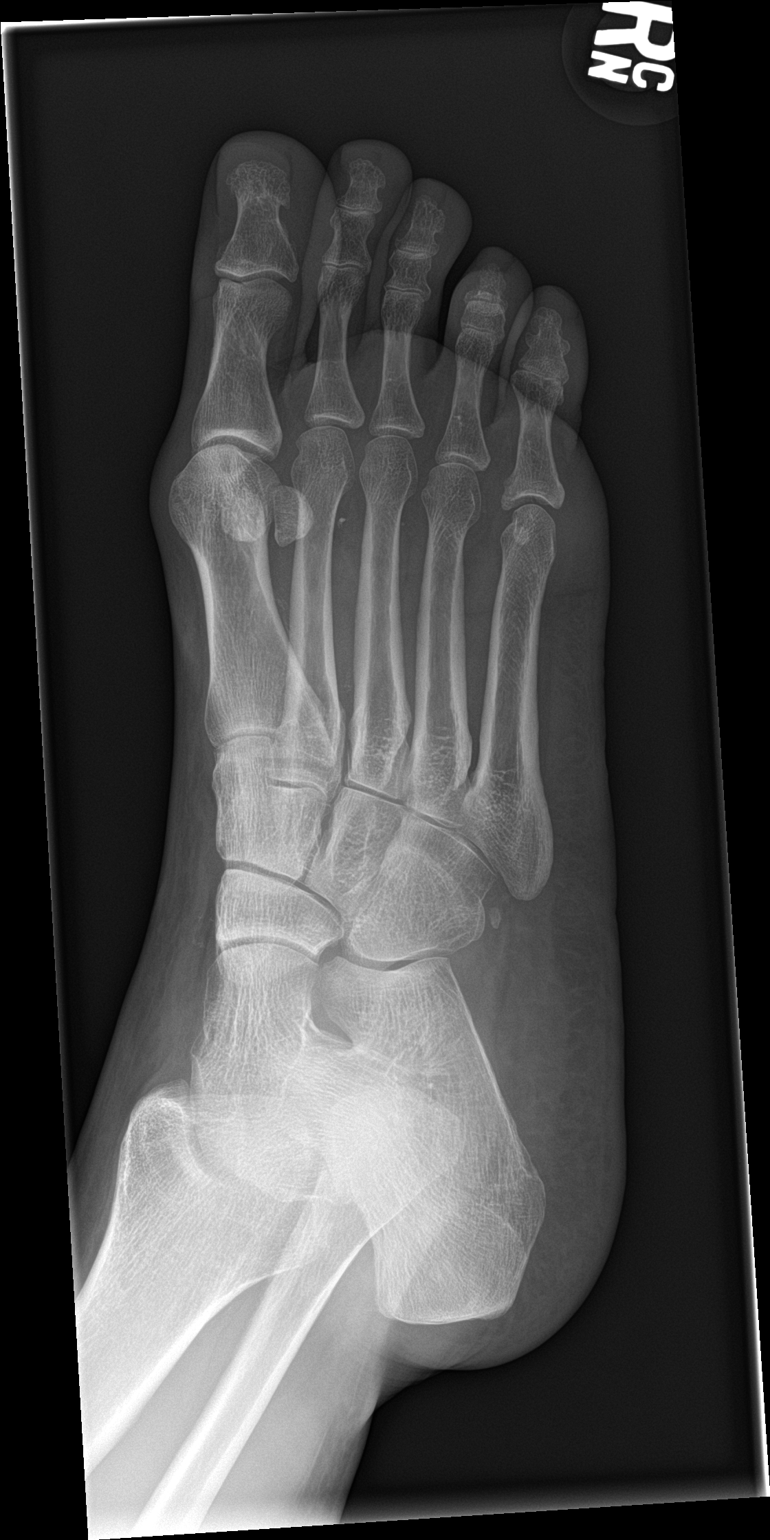

[foot lat]
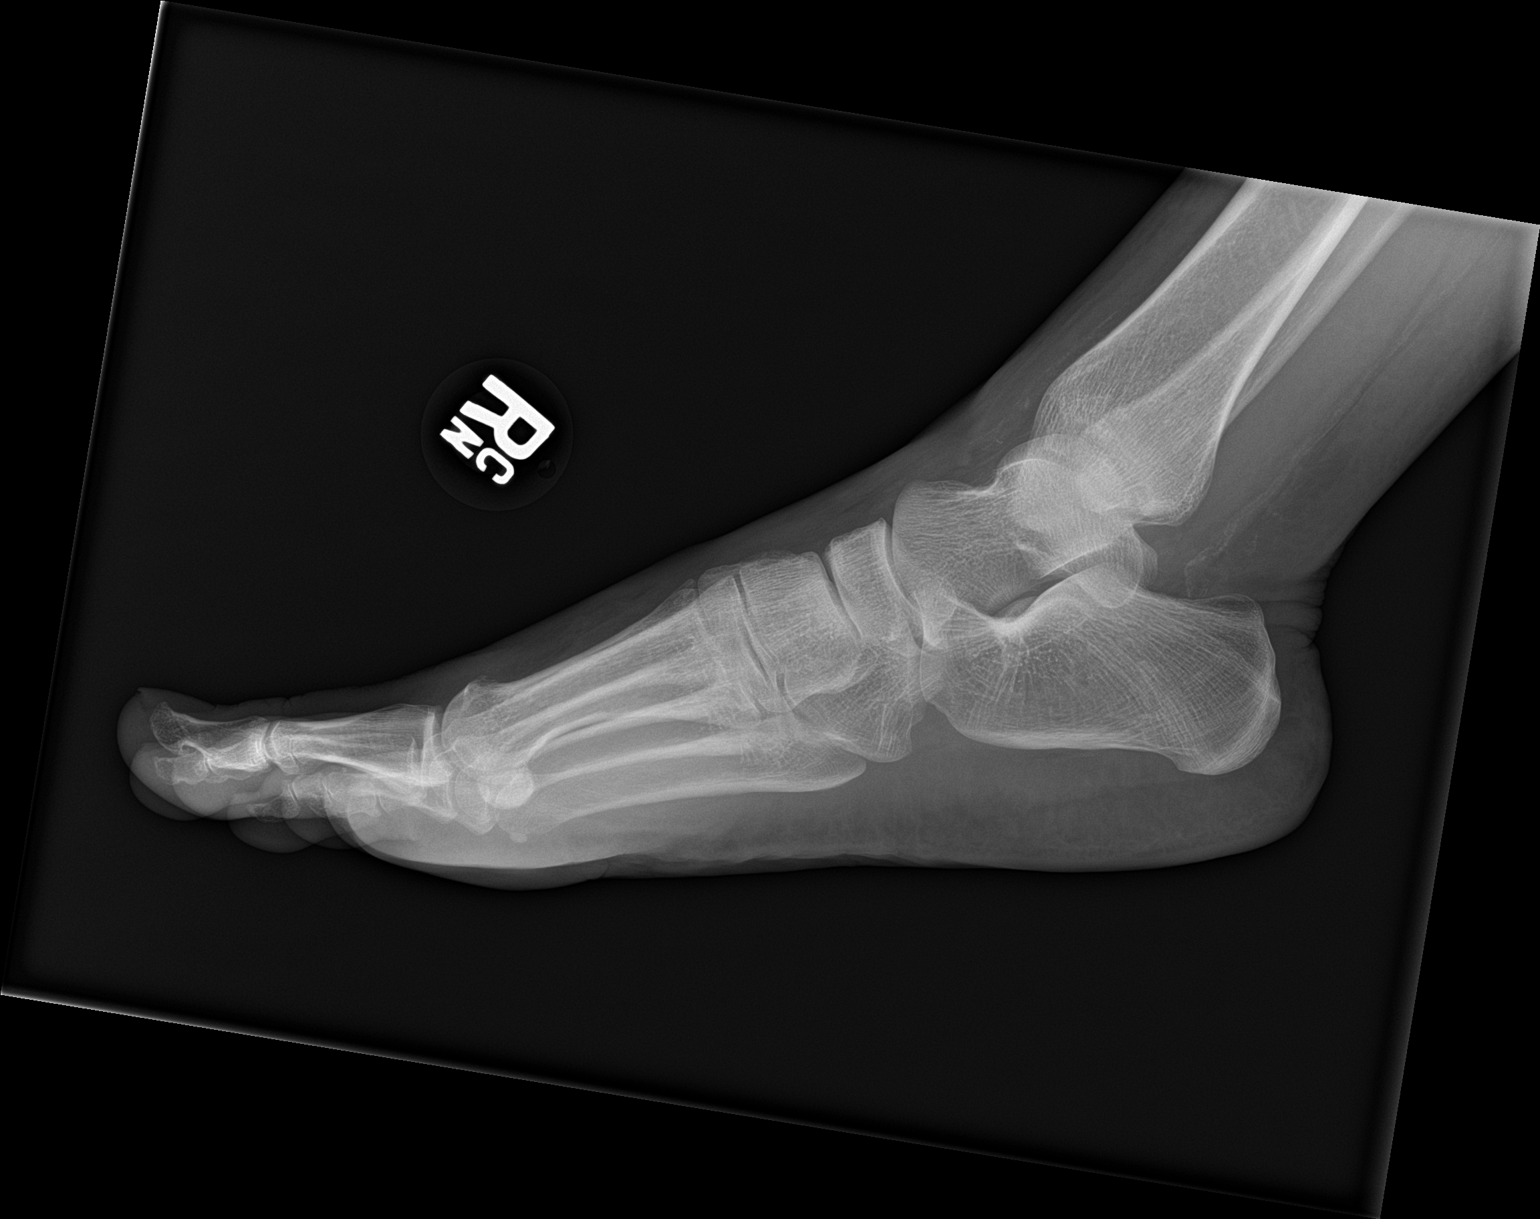

[3 of 3 positions shown; findings below may reference images not displayed]

FINDINGS: There is no evidence of fracture or dislocation. The joint spaces
are preserved. There is no evidence of talar subluxation; the
subtalar joint is unremarkable in appearance. An os peroneum is
noted. Mild pes planus is noted.

Diffuse vascular calcifications are seen.
IMPRESSION: 1. No evidence of fracture or dislocation.
2. Os peroneum noted.
3. Mild pes planus seen.
4. Diffuse vascular calcifications noted.

## 2016-05-04 NOTE — Telephone Encounter (Signed)
Late Entry: Patient called in lmov stating she called in and made an appointment with the schedulers for 05/15/16. I had been calling to schedule her 10 week follow up appointment with Dr Radford Pax and have been unable to reach the patient.

## 2016-05-10 ENCOUNTER — Encounter: Payer: Self-pay | Admitting: Cardiology

## 2016-05-11 ENCOUNTER — Emergency Department (HOSPITAL_BASED_OUTPATIENT_CLINIC_OR_DEPARTMENT_OTHER): Payer: Medicare PPO

## 2016-05-11 ENCOUNTER — Emergency Department (HOSPITAL_BASED_OUTPATIENT_CLINIC_OR_DEPARTMENT_OTHER)
Admission: EM | Admit: 2016-05-11 | Discharge: 2016-05-11 | Disposition: A | Discharge status: Home / Self Care | Payer: Medicare PPO | Attending: Emergency Medicine | Admitting: Emergency Medicine

## 2016-05-11 ENCOUNTER — Encounter (HOSPITAL_BASED_OUTPATIENT_CLINIC_OR_DEPARTMENT_OTHER): Payer: Self-pay | Admitting: Emergency Medicine

## 2016-05-11 DIAGNOSIS — E1065 Type 1 diabetes mellitus with hyperglycemia: Secondary | ICD-10-CM | POA: Diagnosis not present

## 2016-05-11 DIAGNOSIS — R110 Nausea: Secondary | ICD-10-CM | POA: Diagnosis present

## 2016-05-11 DIAGNOSIS — Z79899 Other long term (current) drug therapy: Secondary | ICD-10-CM | POA: Diagnosis not present

## 2016-05-11 DIAGNOSIS — R739 Hyperglycemia, unspecified: Secondary | ICD-10-CM

## 2016-05-11 LAB — CBG MONITORING, ED
Glucose-Capillary: 201 mg/dL — ABNORMAL HIGH (ref 65–99)
Glucose-Capillary: 216 mg/dL — ABNORMAL HIGH (ref 65–99)
Glucose-Capillary: 220 mg/dL — ABNORMAL HIGH (ref 65–99)
Glucose-Capillary: 203 mg/dL — ABNORMAL HIGH (ref 65–99)

## 2016-05-11 LAB — I-STAT VENOUS BLOOD GAS, ED
Acid-Base Excess: 2 mmol/L (ref 0.0–2.0)
Bicarbonate: 28.1 mmol/L — ABNORMAL HIGH (ref 20.0–28.0)
O2 Saturation: 24 %
TCO2: 30 mmol/L (ref 0–100)
pCO2, Ven: 48.6 mmHg (ref 44.0–60.0)
pH, Ven: 7.37 (ref 7.250–7.430)
pO2, Ven: 18 mmHg — CL (ref 32.0–45.0)

## 2016-05-11 LAB — CBC
HCT: 44 % (ref 36.0–46.0)
Hemoglobin: 15.3 g/dL — ABNORMAL HIGH (ref 12.0–15.0)
MCH: 32.1 pg (ref 26.0–34.0)
MCHC: 34.8 g/dL (ref 30.0–36.0)
MCV: 92.4 fL (ref 78.0–100.0)
Platelets: 414 10*3/uL — ABNORMAL HIGH (ref 150–400)
RBC: 4.76 MIL/uL (ref 3.87–5.11)
RDW: 12.6 % (ref 11.5–15.5)
WBC: 15.6 10*3/uL — ABNORMAL HIGH (ref 4.0–10.5)

## 2016-05-11 LAB — COMPREHENSIVE METABOLIC PANEL
ALT: 23 U/L (ref 14–54)
AST: 18 U/L (ref 15–41)
Albumin: 3.9 g/dL (ref 3.5–5.0)
Alkaline Phosphatase: 132 U/L — ABNORMAL HIGH (ref 38–126)
Anion gap: 13 (ref 5–15)
BUN: 13 mg/dL (ref 6–20)
CO2: 27 mmol/L (ref 22–32)
Calcium: 9.4 mg/dL (ref 8.9–10.3)
Chloride: 93 mmol/L — ABNORMAL LOW (ref 101–111)
Creatinine, Ser: 1.11 mg/dL — ABNORMAL HIGH (ref 0.44–1.00)
GFR calc Af Amer: >60 mL/min (ref >60)
GFR calc non Af Amer: 60 mL/min — ABNORMAL LOW (ref >60)
Glucose, Bld: 259 mg/dL — ABNORMAL HIGH (ref 65–99)
Potassium: 3.5 mmol/L (ref 3.5–5.1)
Sodium: 133 mmol/L — ABNORMAL LOW (ref 135–145)
Total Bilirubin: 1 mg/dL (ref 0.3–1.2)
Total Protein: 8.3 g/dL — ABNORMAL HIGH (ref 6.5–8.1)

## 2016-05-11 LAB — PREGNANCY, URINE: Preg Test, Ur: NEGATIVE

## 2016-05-11 LAB — URINALYSIS, MICROSCOPIC (REFLEX)

## 2016-05-11 LAB — URINALYSIS, ROUTINE W REFLEX MICROSCOPIC
Glucose, UA: >=500 mg/dL — AB
Hgb urine dipstick: NEGATIVE
Ketones, ur: 40 mg/dL — AB
Leukocytes, UA: NEGATIVE
Nitrite: NEGATIVE
Protein, ur: NEGATIVE mg/dL
Specific Gravity, Urine: 1.038 — ABNORMAL HIGH (ref 1.005–1.030)
pH: 5 (ref 5.0–8.0)

## 2016-05-11 LAB — I-STAT CG4 LACTIC ACID, ED: Lactic Acid, Venous: 1.25 mmol/L (ref 0.5–1.9)

## 2016-05-11 LAB — TROPONIN I: Troponin I: <0.03 ng/mL (ref <0.03)

## 2016-05-11 MED ORDER — ONDANSETRON HCL 4 MG/2ML IJ SOLN
4.0000 mg | Freq: Once | INTRAMUSCULAR | Status: AC
  Administered 2016-05-11: 4 mg via INTRAVENOUS
  Filled 2016-05-11: qty 2

## 2016-05-11 MED ORDER — ONDANSETRON HCL 4 MG PO TABS: 4.0000 mg | ORAL_TABLET | Freq: Four times a day (QID) | ORAL | 0 refills | Status: DC

## 2016-05-11 MED ORDER — METOCLOPRAMIDE HCL 5 MG/ML IJ SOLN
10.0000 mg | Freq: Once | INTRAMUSCULAR | Status: DC
  Filled 2016-05-11: qty 2

## 2016-05-11 MED ORDER — SODIUM CHLORIDE 0.9 % IV BOLUS (SEPSIS)
1000.0000 mL | Freq: Once | INTRAVENOUS | Status: AC
  Administered 2016-05-11: 1000 mL via INTRAVENOUS

## 2016-05-11 MED ORDER — INSULIN REGULAR HUMAN 100 UNIT/ML IJ SOLN
7.0000 [IU] | Freq: Once | INTRAMUSCULAR | Status: AC
  Administered 2016-05-11: 7 [IU] via SUBCUTANEOUS
  Filled 2016-05-11: qty 1

## 2016-05-11 MED ORDER — PROMETHAZINE HCL 25 MG/ML IJ SOLN
12.5000 mg | Freq: Once | INTRAMUSCULAR | Status: AC
  Administered 2016-05-11: 12.5 mg via INTRAVENOUS
  Filled 2016-05-11: qty 1

## 2016-05-11 NOTE — ED Notes (Signed)
IV attempted x2 unsuccessful. Another RN  To try.

## 2016-05-11 NOTE — ED Provider Notes (Signed)
[facility] Provider Note   CSN: [id number] Arrival date & time: 05/11/16  1541   By signing my name below, I, Charolotte Eke, attest that this documentation has been prepared under the direction and in the presence of Ocie Cornfield, PA-C. Electronically Signed: Charolotte Eke, Scribe. 05/11/16. 4:18 PM.    History   Chief Complaint Chief Complaint  Patient presents with  . Hyperglycemia    HPI Maureen [name] is a 46 y.o. female with h/o DM, DVT, PE, diabetic retinopathy who presents to the Emergency Department complaining of gradual onset, moderate-severe, constant nausea that began yesterday and has not resolved. Pt has associated fatigue, cough, generalized abdominal pain, dry mouth. Pt came in today with BGL in the 600s for the past 2 dats. Pt is Type 1 diabetic for 30 years. Pt takes regular injections of insulin; Lantis at night and takes 8 units Novolog before meals. Pt is on Xaralto for a PE and a DVT in January. Pt has had DKA before and states that she feels like she is in DKA right now. Pt's oldest son has been coughing around her. Pt's BGL in the ED is 209. Pt states that she hasn't eaten anything for the last 2 days. Pt says her PCP is at [facility]. Pt has been taking Zofran for her nausea without relief. Pt denies vomiting, diarrhea, ha, vision changes, neck pain, urinary symptoms, vaginal discharge/bleeding, focal abdominal pain, melena, hematochezia, hemaemesis. Last bowel movement yesterday and was normal. Endorses flatulence.    The history is provided by the patient. No language interpreter was used.    Past Medical History:  Diagnosis Date  . Abdominal pain   . Achilles tendon contracture, left    with lisfrabc fracture  . Anxiety   . Arthritis   . Biliary dyskinesia   . Cervical strain   . Closed head injury 2010  . Depression   . Elevated LFTs   . Fibromyalgia   . GERD (gastroesophageal reflux disease)   . History of kidney stones   .  Hyperlipidemia   . Hypoglycemia   . Joint pain   . Major depressive disorder   . Migraine    hx of none recent  . Nausea & vomiting   . Pneumonia   . Post traumatic stress disorder (PTSD)   . Pulmonary embolism ([place])   . Renal stones   . Skin rash    due to medications  . Sleep trouble   . Stroke [facility])    " series of mini strokes around 2007"  . TIA (transient ischemic attack)   . Wears glasses   . Well controlled type 1 diabetes mellitus with peripheral neuropathy ([place])    Type 1    Patient Active Problem List   Diagnosis Date Noted  . Acute deep vein thrombosis (DVT) of femoral vein of left lower extremity ([place]) 02/10/2016  . Diabetic polyneuropathy associated with type 2 diabetes mellitus ([place]) 02/10/2016  . Lisfranc dislocation, left, sequela 12/16/2015  . Short Achilles tendon (acquired), left ankle 12/16/2015  . Chest pain with moderate risk for cardiac etiology 11/29/2015  . Elevated troponin 11/19/2015  . Migraine headache 11/19/2015  . Neck pain 11/19/2015  . Acute kidney injury ([place]) 02/19/2015  . DKA (diabetic ketoacidoses) ([place]) 10/24/2014  . HLD (hyperlipidemia) 10/24/2014  . Abnormal LFTs 09/03/2014  . Dizziness 09/03/2014  . Acute pulmonary embolism ([place]) 09/03/2014  . Transaminitis 09/02/2014  . GAD (generalized anxiety disorder) 11/23/2013  . PTSD (post-traumatic stress disorder)  11/23/2013  . Severe major depression without psychotic features ([place]) 10/10/2013  . Endometriosis 12/08/2011  . Arthritis of left knee 12/08/2011  . Diabetic retinopathy ([place]) 12/08/2011  . Fibromyalgia 10/23/2011  . DM type 1 (diabetes mellitus, type 1) ([place]) 10/13/2011    Past Surgical History:  Procedure Laterality Date  . ABDOMINAL HYSTERECTOMY  2001  . APPENDECTOMY    . [place]  . CHOLECYSTECTOMY  02/10/2011   Procedure: LAPAROSCOPIC CHOLECYSTECTOMY WITH INTRAOPERATIVE CHOLANGIOGRAM;  Surgeon: Judieth Keens, DO;  Location: [facility];  Service:  General;  Laterality: N/A;  . COLONOSCOPY    . DILATION AND CURETTAGE OF UTERUS    . exploratory laparomty  Mar 04, 2012  . EYE SURGERY  2006   laser eye surgery left eye  . KNEE SURGERY Left 2012  . LAPAROSCOPIC APPENDECTOMY N/A 10/12/2012   Procedure: DIAGNOSTIC APPENDECTOMY LAPAROSCOPIC;  Surgeon: Madilyn Hook, DO;  Location: [facility];  Service: General;  Laterality: N/A;  . lipoma removed  yrs ago  . LIVER BIOPSY    . ORIF ANKLE FRACTURE Left 01/03/2016   Procedure: OPEN REDUCTION INTERNAL FIXATION (ORIF) LEFT LISFRANC JOINT, GASTROC RECESSION;  Surgeon: Newt Minion, MD;  Location: [facility];  Service: Orthopedics;  Laterality: Left;  . ROBOTIC ASSISTED LAPAROSCOPIC LYSIS OF ADHESION N/A 03/04/2012   Procedure: ROBOTIC ASSISTED LAPAROSCOPIC LYSIS OF EXTENSIVE ADHESIONS;  Surgeon: Claiborne Billings A. Pamala Hurry, MD;  Location: [facility];  Service: Gynecology;  Laterality: N/A;  . TONSILLECTOMY  2001    OB History    No data available       Home Medications    Prior to Admission medications   Medication Sig Start Date End Date Taking? Authorizing Provider  acetaminophen (TYLENOL) 500 MG tablet Take 1 tablet (500 mg total) by mouth every 8 (eight) hours as needed for headache. 11/20/15   Mauricio Gerome Apley, MD  atorvastatin (LIPITOR) 40 MG tablet Take 40 mg by mouth at bedtime.     Historical Provider, MD  famotidine (PEPCID) 20 MG tablet Take 1 tablet (20 mg total) by mouth 2 (two) times daily. Patient taking differently: Take 20 mg by mouth 2 (two) times daily as needed for heartburn or indigestion.  12/30/15   Nelva Bush, MD  gabapentin (NEURONTIN) 300 MG capsule Take 300 mg by mouth daily as needed (for pain).  12/11/15   Historical Provider, MD  insulin aspart (NOVOLOG) 100 UNIT/ML injection Inject 8 Units into the skin 3 (three) times daily with meals. 03/05/16   Golden Circle, FNP  insulin glargine (LANTUS) 100 UNIT/ML injection Inject 0.28 mLs (28 Units total) into the skin at  bedtime. Patient taking differently: Inject 30 Units into the skin at bedtime.  03/05/16   Golden Circle, FNP  lidocaine (LIDODERM) 5 % Place 1 patch onto the skin daily as needed (pain). Remove after 12 hours. 10/30/15   Historical Provider, MD  meclizine (ANTIVERT) 25 MG tablet Take 1 tablet (25 mg total) by mouth 3 (three) times daily as needed for dizziness. 04/13/16   Waynetta Pean, PA-C  oxyCODONE (OXY IR/ROXICODONE) 5 MG immediate release tablet Take 1 tablet (5 mg total) by mouth 4 (four) times daily as needed for severe pain. 05/01/16   Golden Circle, FNP  rivaroxaban (XARELTO) 20 MG TABS tablet Take 1 tablet (20 mg total) by mouth daily with supper. Patient not taking: Reported on 04/12/2016 03/05/16   Golden Circle, [place]    Family History Family History  Problem Relation Age of Onset  . Cancer Father     lymphoma  . Nephrolithiasis Father   . Vascular Disease Father   . Alcohol abuse Father   . Drug abuse Father   . Cancer Maternal Grandmother     colon  . Hypertension Mother   . Heart disease Mother   . Cataracts Mother   . Depression Mother   . Diabetes Brother   . Drug abuse Brother   . Mental illness Maternal Grandfather   . Cancer Maternal Grandfather   . Heart disease Paternal Grandmother   . Heart disease Paternal Grandfather   . Suicidality Maternal Uncle     Social History Social History  Substance Use Topics  . Smoking status: Never Smoker  . Smokeless tobacco: Never Used  . Alcohol use Yes     Comment: Socially     Allergies   Cephalexin; Ibuprofen; and Meloxicam   Review of Systems Review of Systems  Constitutional: Negative for chills and fever.  HENT: Positive for congestion and rhinorrhea.        Dry mouth.  Respiratory: Positive for cough. Negative for shortness of breath.   Cardiovascular: Negative for chest pain.  Gastrointestinal: Positive for abdominal pain and nausea. Negative for blood in stool, diarrhea and vomiting.   Genitourinary: Negative for dysuria, flank pain, frequency, urgency, vaginal bleeding and vaginal discharge.  Musculoskeletal: Negative for back pain, neck pain and neck stiffness.  Skin: Negative.   Neurological: Negative for dizziness, syncope, weakness, light-headedness, numbness and headaches.     Physical Exam Updated Vital Signs BP (!) 153/82 (BP Location: Right Arm)   Pulse (!) 117   Temp 98.7 F (37.1 C) (Oral)   Resp 18   Ht 5\' 6"  (1.676 m)   Wt 165 lb (74.8 kg)   SpO2 100%   BMI 26.63 kg/m   Physical Exam  Constitutional: She is oriented to person, place, and time. She appears well-developed and well-nourished. No distress.  HENT:  Head: Normocephalic and atraumatic.  Right Ear: Tympanic membrane, external ear and ear canal normal.  Left Ear: Tympanic membrane, external ear and ear canal normal.  Nose: Mucosal edema and rhinorrhea present.  Mouth/Throat: Uvula is midline. Mucous membranes are dry. No trismus in the jaw. No uvula swelling. Posterior oropharyngeal erythema present. No oropharyngeal exudate, posterior oropharyngeal edema or tonsillar abscesses. No tonsillar exudate.  Eyes: Conjunctivae and EOM are normal. Pupils are equal, round, and reactive to light. Right eye exhibits no discharge. Left eye exhibits no discharge. No scleral icterus.  Neck: Normal range of motion. Neck supple. No thyromegaly present.  No nuchal rgiidity  Cardiovascular: Regular rhythm, normal heart sounds and intact distal pulses.   Tachycardia   Pulmonary/Chest: Effort normal and breath sounds normal. No respiratory distress. She has no wheezes. She has no rales. She exhibits no tenderness.  Non productive cough noted  Abdominal: Soft. Bowel sounds are normal. She exhibits no distension. There is generalized tenderness. There is no rebound and no guarding.  No focal tenderness, no cva tenderness  Musculoskeletal: Normal range of motion.  Lymphadenopathy:    She has no cervical  adenopathy.  Neurological: She is alert and oriented to person, place, and time.  Skin: Skin is warm and dry.  Psychiatric: She has a normal mood and affect.  Nursing note and vitals reviewed.    ED Treatments / Results   DIAGNOSTIC STUDIES: Oxygen Saturation is 100% on room air, normal by my interpretation.    COORDINATION  OF CARE: 4:10 PM Discussed treatment plan with pt at bedside and pt agreed to plan, which include CMP.    Labs (all labs ordered are listed, but only abnormal results are displayed) Labs Reviewed  CBC - Abnormal; Notable for the following:       Result Value   WBC 15.6 (*)    Hemoglobin 15.3 (*)    Platelets 414 (*)    All other components within normal limits  URINALYSIS, ROUTINE W REFLEX MICROSCOPIC - Abnormal; Notable for the following:    APPearance CLOUDY (*)    Specific Gravity, Urine 1.038 (*)    Glucose, UA >=500 (*)    Bilirubin Urine SMALL (*)    Ketones, ur 40 (*)    All other components within normal limits  COMPREHENSIVE METABOLIC PANEL - Abnormal; Notable for the following:    Sodium 133 (*)    Chloride 93 (*)    Glucose, Bld 259 (*)    Creatinine, Ser 1.11 (*)    Total Protein 8.3 (*)    Alkaline Phosphatase 132 (*)    GFR calc non Af Amer 60 (*)    All other components within normal limits  URINALYSIS, MICROSCOPIC (REFLEX) - Abnormal; Notable for the following:    Bacteria, UA RARE (*)    Squamous Epithelial / LPF 0-5 (*)    All other components within normal limits  CBG MONITORING, ED - Abnormal; Notable for the following:    Glucose-Capillary 201 (*)    All other components within normal limits  I-STAT VENOUS BLOOD GAS, ED - Abnormal; Notable for the following:    pO2, Ven 18.0 (*)    Bicarbonate 28.1 (*)    All other components within normal limits  CBG MONITORING, ED - Abnormal; Notable for the following:    Glucose-Capillary 216 (*)    All other components within normal limits  CBG MONITORING, ED - Abnormal; Notable for  the following:    Glucose-Capillary 220 (*)    All other components within normal limits  CBG MONITORING, ED - Abnormal; Notable for the following:    Glucose-Capillary 203 (*)    All other components within normal limits  TROPONIN I  PREGNANCY, URINE  I-STAT CG4 LACTIC ACID, ED    EKG  EKG Interpretation  Date/Time:  Monday May 11 2016 16:52:15 EDT Ventricular Rate:  90 PR Interval:    QRS Duration: 92 QT Interval:  385 QTC Calculation: 472 R Axis:   86 Text Interpretation:  Sinus rhythm Confirmed by Alvino Chapel  MD, Ovid Curd [id number]) on 05/12/2016 7:59:00 PM       Radiology Dg Chest 2 View  Result Date: 05/11/2016 CLINICAL DATA:  46 y/o F; chest pain, shortness of breath, hyperglycemia. EXAM: CHEST  2 VIEW COMPARISON:  04/12/2016 CT chest.  04/12/2016 chest radiograph. FINDINGS: State heart size and mediastinal contours are within normal limits. Both lungs are clear. The visualized skeletal structures are unremarkable. Right upper quadrant cholecystectomy clips. IMPRESSION: No active cardiopulmonary disease. Electronically Signed   By: Kristine Garbe M.D.   On: 05/11/2016 18:54    Procedures Procedures (including critical care time)  Medications Ordered in ED Medications  sodium chloride 0.9 % bolus 1,000 mL (0 mLs Intravenous Stopped 05/11/16 1820)  ondansetron (ZOFRAN) injection 4 mg (4 mg Intravenous Given 05/11/16 1732)  sodium chloride 0.9 % bolus 1,000 mL (0 mLs Intravenous Stopped 05/11/16 2015)  ondansetron (ZOFRAN) injection 4 mg (4 mg Intravenous Given 05/11/16 1829)  promethazine (PHENERGAN) injection 12.5 mg (  12.5 mg Intravenous Given 05/11/16 2028)  insulin regular (NOVOLIN R,HUMULIN R) 250 units/2.69mL (100 units/mL) injection 7 Units (7 Units Subcutaneous Given 05/11/16 2029)     Initial Impression / Assessment and Plan / ED Course  I have reviewed the triage vital signs and the nursing notes.  Pertinent labs & imaging results that were available  during my care of the patient were reviewed by me and considered in my medical decision making (see chart for details).     Pt presents to the ED with hyperglycemia, cough, rhinorrhea, generalized abd pain, and emesis. Pt has been supplementing insulin to keep bsg under control. Pt has no focal abd pain concerning for intra abdominal process including appendicitis, diverticulitis, ectopic pregnancy or pid. Mild leukocytosis of 15,000. Lactic normal. Anion gap normal. Bicarb normal. Ph is normal. Small amount of ketones in urine. Glucose 259. No signs of DKA. US shows signs of infection but no signs of infection. EKG normal. Troponin normal. CXR shows no signs focal infiltrate. BSG 259 on arrival. Was given 2 fluid bolus and insulin with improvement to 201. Pt able to tolerate po fluids and food. Feel that elevated sugar may be due to viral uri. Encouraged continue insulin use and plenty of po fluids with continued zofran. Encouraged follow up with pcp this week to have sugar rechecked. Vs are stable. Tachycardia improved. Afebrile. Pt is hemodynamically stable, in NAD, & able to ambulate in the ED. Pain has been managed & has no complaints prior to dc. Pt is comfortable with above plan and is stable for discharge at this time. All questions were answered prior to disposition. Strict return precautions for f/u to the ED were discussed. Pt dicussed with Dr. Sherry Ruffing who is agreeable to the above plan.    Final Clinical Impressions(s) / ED Diagnoses   Final diagnoses:  Hyperglycemia  Nausea    New Prescriptions Discharge Medication List as of 05/11/2016 10:02 PM    START taking these medications   Details  ondansetron (ZOFRAN) 4 MG tablet Take 1 tablet (4 mg total) by mouth every 6 (six) hours., Starting Mon 05/11/2016, Print       I personally performed the services described in this documentation, which was scribed in my presence. The recorded information has been reviewed and is accurate.     Doristine Devoid, PA-C 05/13/16 [place], MD 05/13/16 [phone]

## 2016-05-11 NOTE — ED Triage Notes (Signed)
Pt presents with blood sugars in the 600s at home. Pt states "I know I'm in Ketones." Normally controlled with insulin but is unable to get it down. A/O no distress.

## 2016-05-11 NOTE — Discharge Instructions (Signed)
All of your lab work has been reassuring. Make sure urine taking her insulin at home as prescribed. Use the Zofran for nausea. Please follow-up with your primary care doctor tomorrow for recheck and possible blood sugar management. Return to the ED if he develop any worsening vomiting, fevers, worsening pain or for any other reason.

## 2016-05-11 NOTE — ED Notes (Signed)
IV attempted x1 without success

## 2016-05-12 ENCOUNTER — Ambulatory Visit: Payer: Self-pay

## 2016-05-12 ENCOUNTER — Other Ambulatory Visit: Payer: Self-pay

## 2016-05-12 ENCOUNTER — Ambulatory Visit: Payer: Self-pay | Admitting: Family

## 2016-05-14 ENCOUNTER — Emergency Department (HOSPITAL_BASED_OUTPATIENT_CLINIC_OR_DEPARTMENT_OTHER)
Admission: EM | Admit: 2016-05-14 | Discharge: 2016-05-14 | Disposition: A | Discharge status: Home / Self Care | Payer: Medicare PPO | Attending: Emergency Medicine | Admitting: Emergency Medicine

## 2016-05-14 ENCOUNTER — Encounter (HOSPITAL_BASED_OUTPATIENT_CLINIC_OR_DEPARTMENT_OTHER): Payer: Self-pay | Admitting: Emergency Medicine

## 2016-05-14 ENCOUNTER — Encounter: Payer: Self-pay | Admitting: Cardiology

## 2016-05-14 DIAGNOSIS — R200 Anesthesia of skin: Secondary | ICD-10-CM

## 2016-05-14 DIAGNOSIS — R202 Paresthesia of skin: Secondary | ICD-10-CM | POA: Diagnosis not present

## 2016-05-14 DIAGNOSIS — M542 Cervicalgia: Secondary | ICD-10-CM

## 2016-05-14 DIAGNOSIS — Z79899 Other long term (current) drug therapy: Secondary | ICD-10-CM | POA: Insufficient documentation

## 2016-05-14 DIAGNOSIS — Z9989 Dependence on other enabling machines and devices: Secondary | ICD-10-CM

## 2016-05-14 DIAGNOSIS — E669 Obesity, unspecified: Secondary | ICD-10-CM | POA: Insufficient documentation

## 2016-05-14 DIAGNOSIS — E109 Type 1 diabetes mellitus without complications: Secondary | ICD-10-CM | POA: Insufficient documentation

## 2016-05-14 DIAGNOSIS — G4733 Obstructive sleep apnea (adult) (pediatric): Secondary | ICD-10-CM

## 2016-05-14 HISTORY — DX: Dependence on other enabling machines and devices: Z99.89

## 2016-05-14 HISTORY — DX: Obstructive sleep apnea (adult) (pediatric): G47.33

## 2016-05-14 MED ORDER — HYDROCODONE-ACETAMINOPHEN 5-325 MG PO TABS: 1.0000 | ORAL_TABLET | Freq: Four times a day (QID) | ORAL | 0 refills | Status: DC | PRN

## 2016-05-14 MED FILL — HYDROCODON-APAP 5-325: 5-325 | 2 days supply | Qty: 10 | Fill #0

## 2016-05-14 NOTE — ED Provider Notes (Signed)
[facility] DEPT [facility] Provider Note   CSN: [id number] Arrival date & time: 05/14/16  0931     History   Chief Complaint Chief Complaint  Patient presents with  . Neck Pain  . Numbness    HPI Maureen [name] is a 46 y.o. female with PMHx DM, DVT, PE who presents today with chief complaint constant progressively worsening sharp and aching right sided neck pain since last night. She states she developed mild "nothing I would address" posterior neck pain last night, went to sleep, and awoke with worsening of her symptoms.  She states neck pain is right sided from the base of her  neck radiating to the elbow, with associted forearm numbness and tingling. Movement makes pain worse, keeping it still makes it better. Flexeril, lidocaine, heating pad, extra strength tylenol were tried with minimal improvement. She has a history of diabetic neuropathy but states "this feels different".   She was seen here recently for nausea and hyperglycemia, and states improved nausea with improved control of her blood sugard, no vomitting, no diarrhea, or constipation. She has a history of DVT but stopped taking her Xarelto 1 month ago due to financial constraints. She states her current symptoms do not feel like her last DVT. She has an appointment with hematology for next Tuesday. Denies melena, hematochezia, dysuria, hematuria. No change in SOB, no CP. Denies weakness, vision or hearing changes, fevers, chills, LOC.   The history is provided by the patient.    Past Medical History:  Diagnosis Date  . Abdominal pain   . Achilles tendon contracture, left    with lisfrabc fracture  . Anxiety   . Arthritis   . Biliary dyskinesia   . Cervical strain   . Closed head injury 2010  . Depression   . Elevated LFTs   . Fibromyalgia   . GERD (gastroesophageal reflux disease)   . History of kidney stones   . Hyperlipidemia   . Hypoglycemia   . Joint pain   . Major depressive disorder   . Migraine    hx of none recent  . Nausea & vomiting   . OSA on CPAP 05/14/2016   Moderate to severe OSA with an AHI of 28/hr now on CPAP at 19cm H2O  . Pneumonia   . Post traumatic stress disorder (PTSD)   . Pulmonary embolism ([place])   . Renal stones   . Skin rash    due to medications  . Sleep trouble   . Stroke [facility])    " series of mini strokes around 2007"  . TIA (transient ischemic attack)   . Wears glasses   . 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 OSA on CPAP 05/14/2016  . Obesity (BMI 30-39.9) 05/14/2016  . Acute deep vein thrombosis (DVT) of femoral vein of left lower extremity ([place]) 02/10/2016  . Diabetic polyneuropathy associated with type 2 diabetes mellitus ([place]) 02/10/2016  . Lisfranc dislocation, left, sequela 12/16/2015  . Short Achilles tendon (acquired), left ankle 12/16/2015  . Chest pain with moderate risk for cardiac etiology 11/29/2015  . Elevated troponin 11/19/2015  . Migraine headache 11/19/2015  . Neck pain 11/19/2015  . Acute kidney injury ([place]) 02/19/2015  . DKA (diabetic ketoacidoses) ([place]) 10/24/2014  . HLD (hyperlipidemia) 10/24/2014  . Abnormal LFTs 09/03/2014  . Dizziness 09/03/2014  . Acute pulmonary embolism ([place]) 09/03/2014  .  Transaminitis 09/02/2014  . GAD (generalized anxiety disorder) 11/23/2013  . PTSD (post-traumatic stress disorder) 11/23/2013  . Severe major depression without psychotic features ([place]) 10/10/2013  . Endometriosis 12/08/2011  . Arthritis of left knee 12/08/2011  . Diabetic retinopathy ([place]) 12/08/2011  . Fibromyalgia 10/23/2011  . DM type 1 (diabetes mellitus, type 1) ([place]) 10/13/2011    Past Surgical History:  Procedure Laterality Date  . ABDOMINAL HYSTERECTOMY  2001  . APPENDECTOMY    . [place]  . CHOLECYSTECTOMY  02/10/2011   Procedure: LAPAROSCOPIC CHOLECYSTECTOMY WITH INTRAOPERATIVE  CHOLANGIOGRAM;  Surgeon: Judieth Keens, DO;  Location: [facility];  Service: General;  Laterality: N/A;  . COLONOSCOPY    . DILATION AND CURETTAGE OF UTERUS    . exploratory laparomty  Mar 04, 2012  . EYE SURGERY  2006   laser eye surgery left eye  . KNEE SURGERY Left 2012  . LAPAROSCOPIC APPENDECTOMY N/A 10/12/2012   Procedure: DIAGNOSTIC APPENDECTOMY LAPAROSCOPIC;  Surgeon: Madilyn Hook, DO;  [facility];  Service: General;  Laterality: N/A;  . lipoma removed  yrs ago  . LIVER BIOPSY    . ORIF ANKLE FRACTURE Left 01/03/2016   Procedure: OPEN REDUCTION INTERNAL FIXATION (ORIF) LEFT LISFRANC JOINT, GASTROC RECESSION;  Surgeon: Newt Minion, MD;  Location: [facility];  Service: Orthopedics;  Laterality: Left;  . ROBOTIC ASSISTED LAPAROSCOPIC LYSIS OF ADHESION N/A 03/04/2012   Procedure: ROBOTIC ASSISTED LAPAROSCOPIC LYSIS OF EXTENSIVE ADHESIONS;  Surgeon: Claiborne Billings A. Pamala Hurry, MD;  Location: [facility];  Service: Gynecology;  Laterality: N/A;  . TONSILLECTOMY  2001    OB History    No data available       Home Medications    Prior to Admission medications   Medication Sig Start Date End Date Taking? Authorizing Provider  acetaminophen (TYLENOL) 500 MG tablet Take 1 tablet (500 mg total) by mouth every 8 (eight) hours as needed for headache. 11/20/15   Mauricio Gerome Apley, MD  atorvastatin (LIPITOR) 40 MG tablet Take 40 mg by mouth at bedtime.     Historical Provider, MD  famotidine (PEPCID) 20 MG tablet Take 1 tablet (20 mg total) by mouth 2 (two) times daily. Patient taking differently: Take 20 mg by mouth 2 (two) times daily as needed for heartburn or indigestion.  12/30/15   Nelva Bush, MD  gabapentin (NEURONTIN) 300 MG capsule Take 300 mg by mouth daily as needed (for pain).  12/11/15   Historical Provider, MD  insulin aspart (NOVOLOG) 100 UNIT/ML injection Inject 8 Units into the skin 3 (three) times daily with meals. 03/05/16   Golden Circle, FNP  insulin glargine (LANTUS) 100  UNIT/ML injection Inject 0.28 mLs (28 Units total) into the skin at bedtime. Patient taking differently: Inject 30 Units into the skin at bedtime.  03/05/16   Golden Circle, FNP  lidocaine (LIDODERM) 5 % Place 1 patch onto the skin daily as needed (pain). Remove after 12 hours. 10/30/15   Historical Provider, MD  ondansetron (ZOFRAN) 4 MG tablet Take 1 tablet (4 mg total) by mouth every 6 (six) hours. 05/11/16   Doristine Devoid, PA-C  oxyCODONE (OXY IR/ROXICODONE) 5 MG immediate release tablet Take 1 tablet (5 mg total) by mouth 4 (four) times daily as needed for severe pain. Patient not taking: Reported on 05/15/2016 05/01/16   Golden Circle, FNP  rivaroxaban (XARELTO) 20 MG TABS tablet Take 1 tablet (20 mg total) by mouth daily with supper. Patient not taking:  Reported on 05/15/2016 03/05/16   Golden Circle, FNP    Family History Family History  Problem Relation Age of Onset  . Cancer Father     lymphoma  . Nephrolithiasis Father   . Vascular Disease Father   . Alcohol abuse Father   . Drug abuse Father   . Cancer Maternal Grandmother     colon  . Hypertension Mother   . Heart disease Mother   . Cataracts Mother   . Depression Mother   . Diabetes Brother   . Drug abuse Brother   . Mental illness Maternal Grandfather   . Cancer Maternal Grandfather   . Heart disease Paternal Grandmother   . Heart disease Paternal Grandfather   . Suicidality Maternal Uncle     Social History Social History  Substance Use Topics  . Smoking status: Never Smoker  . Smokeless tobacco: Never Used  . Alcohol use Yes     Comment: Socially     Allergies   Cephalexin; Ibuprofen; and Meloxicam   Review of Systems Review of Systems  Constitutional: Negative for chills and fever.  Respiratory: Negative for shortness of breath.   Cardiovascular: Negative for chest pain.  Gastrointestinal: Negative for abdominal pain, blood in stool, diarrhea, nausea and vomiting.  Genitourinary:  Negative for dysuria and hematuria.  Musculoskeletal: Positive for myalgias and neck pain. Negative for back pain.  Neurological: Positive for numbness. Negative for dizziness, syncope, weakness and headaches.     Physical Exam Updated Vital Signs BP 130/77 (BP Location: Left Arm)   Pulse 98   Temp 98.6 F (37 C) (Oral)   Resp 20   Ht 5\' 4"  (1.626 m)   Wt 74.8 kg   SpO2 98%   BMI 28.32 kg/m   Physical Exam  Constitutional: She is oriented to person, place, and time. She appears well-developed and well-nourished. No distress.  HENT:  Head: Normocephalic and atraumatic.  Eyes: Conjunctivae and EOM are normal. Pupils are equal, round, and reactive to light. Right eye exhibits no discharge. Left eye exhibits no discharge. No scleral icterus.  Neck: Normal range of motion. Neck supple. No JVD present. No tracheal deviation present.  No midline CSP ttp  Cardiovascular: Normal rate and regular rhythm.   No murmur heard. 2+ radial and dp/pt pulses bl, negative homan's bl  Pulmonary/Chest: Effort normal and breath sounds normal. No respiratory distress.  Abdominal: Soft. There is no tenderness.  Musculoskeletal: She exhibits no edema.  Normal range of motion of neck and shoulder. No midline spine tenderness to palpation. 5/5 strength of bilateral upper extremities. Pain along palpation of right lateral posterior neck, AC joint, and deltoid down to the bicep. No deformity, swelling, or crepitus noted. Compartments soft. Negative empty can sign or neer's, no arc of pain. No swelling or erythema noted  Neurological: She is alert and oriented to person, place, and time. A sensory deficit is present. No cranial nerve deficit.  Altered sensation of right forearm with circumferential numbness, tingling of the entire right hand and digits. Fluent speech, no facial droop.  Skin: Skin is warm and dry. Capillary refill takes less than 2 seconds. No erythema.  Psychiatric: She has a normal mood and  affect. Her behavior is normal.  Nursing note and vitals reviewed.    ED Treatments / Results  Labs (all labs ordered are listed, but only abnormal results are displayed) Labs Reviewed - No data to display  EKG  EKG Interpretation None  Radiology No results found.  Procedures Procedures (including critical care time)  Medications Ordered in ED Medications - No data to display   Initial Impression / Assessment and Plan / ED Course  I have reviewed the triage vital signs and the nursing notes.  Pertinent labs & imaging results that were available during my care of the patient were reviewed by me and considered in my medical decision making (see chart for details).     Patient with right neck pain radiating into right upper arm with altered sensation of right forearm and hand. Afebrile, VSS with resolution of initial hypertensive reading while in ED. Pt well-appearing in NAD, strength intact. Low suspicion fracture, DVT (in absence of swelling, erythema). Likely musculoskeletal in nature with possible radiculopathy. Advised pt that symptoms should improve within the next few days, recommend f/u with PCP for re-evalaution. Small rx for norco given. Discussed use of heat, ice, tylenol, gentle stretching for symptom relief. Discussed strict ED return precautions. Pt verbalized understanding of and agreement with plan and is safe for discharge home at this time. Patient seen and evaluated by Dr. Rogene [place].   Final Clinical Impressions(s) / ED Diagnoses   Final diagnoses:  Acute neck pain  Numbness and tingling of right arm    New Prescriptions Discharge Medication List as of 05/14/2016 11:21 AM    START taking these medications   Details  HYDROcodone-acetaminophen (NORCO/VICODIN) 5-325 MG tablet Take 1-2 tablets by mouth every 6 (six) hours as needed for moderate pain., Starting Thu 05/14/2016, [place], PA-C 05/15/16 [place],  MD 05/15/16 [phone]

## 2016-05-14 NOTE — Discharge Instructions (Signed)
Use Moist heat therapy by taking a dish rag and soaking in water then ringing out excess water then microwave for 10-15 seconds until hot but not so hot that it would burn and heat to areas of soreness for 15 minutes, multiple times a day. Back Pain:  Your back pain should be treated with medicines such as ibuprofen or aleve and this back pain should get better over the next 2 weeks.  However if you develop severe or worsening pain, low back pain with fever, numbness, weakness or inability to walk or urinate, you should return to the ER immediately.  Please follow up with your doctor this week for a recheck if still having symptoms.  Self - care:  The application of heat can help soothe the pain.  Maintaining your daily activities, including walking, is encourged, as it will help you get better faster than just staying in bed.  Medications are also useful to help with pain control.  A commonly prescribed medications includes acetaminophen.  This medication is generally safe, though you should not take more than 8 of the extra strength (500mg ) pills a day.  Non steroidal anti inflammatory medications including Ibuprofen and naproxen;  These medications help both pain and swelling and are very useful in treating back pain.  They should be taken with food, as they can cause stomach upset, and more seriously, stomach bleeding.    Muscle relaxants:  These medications can help with muscle tightness that is a cause of lower back pain.  Most of these medications can cause drowsiness, and it is not safe to drive or use dangerous machinery while taking them.  You will need to follow up with  Your primary healthcare provider in 1-2 weeks for reassessment.  Be aware that if you develop new symptoms, such as a fever, leg weakness, difficulty with or loss of control of your urine or bowels, abdominal pain, or more severe pain, you will need to seek medical attention and  / or return to the Emergency  department.  If you do not have a doctor see the list below.

## 2016-05-14 NOTE — ED Triage Notes (Addendum)
Pt sts woke up with RT neck pain and RUE tingling; tried Flexeril, heating pad and Lidocaine patch w/o relief

## 2016-05-14 NOTE — Progress Notes (Signed)
Cardiology Office Note    Date:  05/15/2016   ID:  Maureen [name], Maureen [name] 03-05-1970, MRN [id number]  PCP:  Mauricio Po, FNP  Cardiologist:  Fransico Him, MD   Chief Complaint  Patient presents with  . Sleep Apnea    History of Present Illness:  Maureen [name] is a 46 y.o. female with a history of GERD and DM who was referred by Dr. Saunders Revel for complaints of excessive daytime sleepiness (although epworth sleepiness score was only 5), morning headaches, sleep talking, snoring and witnessed apnea.  She underwent PSG showing moderate to severe OSA with an AHI of 28/hr with mild cenral sleep apnea with a CAI of 14.8/hr, moderate oxygen desaturations as low as 85%, moderate snoring and underwent CPAP titration to 19cm H2O.  She is now here for followup.  Unfortunately she has not been using it much because she has been having a lot of problems getting and staying asleep.  She uses a full face mask which she tolerates well.  She feels the pressure is adequate.  In the beginning she was taking medication to help her sleep and once on the CPAP she felt more rested in the am and had less daytime sleepiness but she has changed PCPs and now does not have any sleep aide to take at night.  She had been taking Valium.  She says that Lunesta and Ambien did not help.  She also was on Seroquel and Ativan which did not help.  She says that she has a lot of anxiety which is the reason for her sleep issues.  She does not think that she snores when the uses the CPAP.   Past Medical History:  Diagnosis Date  . Abdominal pain   . Achilles tendon contracture, left    with lisfrabc fracture  . Anxiety   . Arthritis   . Biliary dyskinesia   . Cervical strain   . Closed head injury 2010  . Depression   . Elevated LFTs   . Fibromyalgia   . GERD (gastroesophageal reflux disease)   . History of kidney stones   . Hyperlipidemia   . Hypoglycemia   . Joint pain   . Major depressive disorder   . Migraine      hx of none recent  . Nausea & vomiting   . OSA on CPAP 05/14/2016   Moderate to severe OSA with an AHI of 28/hr now on CPAP at 19cm H2O  . Pneumonia   . Post traumatic stress disorder (PTSD)   . Pulmonary embolism ([place])   . Renal stones   . Skin rash    due to medications  . Sleep trouble   . Stroke [facility])    " series of mini strokes around 2007"  . TIA (transient ischemic attack)   . Wears glasses   . Well controlled type 1 diabetes mellitus with peripheral neuropathy ([place])    Type 1    Past Surgical History:  Procedure Laterality Date  . ABDOMINAL HYSTERECTOMY  2001  . APPENDECTOMY    . [place]  . CHOLECYSTECTOMY  02/10/2011   Procedure: LAPAROSCOPIC CHOLECYSTECTOMY WITH INTRAOPERATIVE CHOLANGIOGRAM;  Surgeon: Judieth Keens, DO;  Location: [facility];  Service: General;  Laterality: N/A;  . COLONOSCOPY    . DILATION AND CURETTAGE OF UTERUS    . exploratory laparomty  Mar 04, 2012  . EYE SURGERY  2006   laser eye surgery left eye  . KNEE SURGERY  Left 2012  . LAPAROSCOPIC APPENDECTOMY N/A 10/12/2012   Procedure: DIAGNOSTIC APPENDECTOMY LAPAROSCOPIC;  Surgeon: Madilyn Hook, DO;  [facility];  Service: General;  Laterality: N/A;  . lipoma removed  yrs ago  . LIVER BIOPSY    . ORIF ANKLE FRACTURE Left 01/03/2016   Procedure: OPEN REDUCTION INTERNAL FIXATION (ORIF) LEFT LISFRANC JOINT, GASTROC RECESSION;  Surgeon: Newt Minion, MD;  Location: [facility];  Service: Orthopedics;  Laterality: Left;  . ROBOTIC ASSISTED LAPAROSCOPIC LYSIS OF ADHESION N/A 03/04/2012   Procedure: ROBOTIC ASSISTED LAPAROSCOPIC LYSIS OF EXTENSIVE ADHESIONS;  Surgeon: Claiborne Billings A. Pamala Hurry, MD;  Location: [facility];  Service: Gynecology;  Laterality: N/A;  . TONSILLECTOMY  2001    Current Medications: Current Meds  Medication Sig  . acetaminophen (TYLENOL) 500 MG tablet Take 1 tablet (500 mg total) by mouth every 8 (eight) hours as needed for headache.  Marland Kitchen atorvastatin (LIPITOR) 40 MG  tablet Take 40 mg by mouth at bedtime.   . famotidine (PEPCID) 20 MG tablet Take 1 tablet (20 mg total) by mouth 2 (two) times daily. (Patient taking differently: Take 20 mg by mouth 2 (two) times daily as needed for heartburn or indigestion. )  . gabapentin (NEURONTIN) 300 MG capsule Take 300 mg by mouth daily as needed (for pain).   . insulin aspart (NOVOLOG) 100 UNIT/ML injection Inject 8 Units into the skin 3 (three) times daily with meals.  . insulin glargine (LANTUS) 100 UNIT/ML injection Inject 0.28 mLs (28 Units total) into the skin at bedtime. (Patient taking differently: Inject 30 Units into the skin at bedtime. )  . lidocaine (LIDODERM) 5 % Place 1 patch onto the skin daily as needed (pain). Remove after 12 hours.  . ondansetron (ZOFRAN) 4 MG tablet Take 1 tablet (4 mg total) by mouth every 6 (six) hours.  . [DISCONTINUED] HYDROcodone-acetaminophen (NORCO/VICODIN) 5-325 MG tablet Take 1-2 tablets by mouth every 6 (six) hours as needed for moderate pain.  . [DISCONTINUED] meclizine (ANTIVERT) 25 MG tablet Take 1 tablet (25 mg total) by mouth 3 (three) times daily as needed for dizziness.    Allergies:   Cephalexin; Ibuprofen; and Meloxicam   Social History   Social History  . Marital status: Divorced    Spouse name: Kena Limon  . Number of children: 2  . Years of education: 12+   Occupational History  . 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 Smoking status: Never Smoker  . Smokeless tobacco: Never Used  . Alcohol use Yes     Comment: Socially  . Drug use: No  . Sexual activity: Not Asked   Other Topics Concern  . None   Social History Narrative   Lives independently.  Before going back to school, she was a Research scientist (physical sciences) at [employer].  Stop working when she was diagnosed with endometriosis and had a TIA.     Family History:  The patient's family history includes Alcohol abuse in her father; Cancer in her father, maternal  grandfather, and maternal grandmother; Cataracts in her mother; Depression in her mother; Diabetes in her brother; Drug abuse in her brother and father; Heart disease in her mother, paternal grandfather, and paternal grandmother; Hypertension in her mother; Mental illness in her maternal grandfather; Nephrolithiasis in her father; Suicidality in her maternal uncle; Vascular Disease in her father.   ROS:   Please see the history of present illness.    ROS All other systems reviewed and  are negative.  PAD Screen 11/29/2015  Previous PAD dx? No  Previous surgical procedure? No  Pain with walking? Yes  Subsides with rest? Yes  Feet/toe relief with dangling? No  Painful, non-healing ulcers? No  Extremities discolored? No       PHYSICAL EXAM:   VS:  BP 118/76   Pulse 80   Ht 5\' 4"  (1.626 m)   Wt 175 lb 12.8 oz (79.7 kg)   SpO2 96%   BMI 30.18 kg/m    GEN: Well nourished, well developed, in no acute distress  HEENT: normal  Neck: no JVD, carotid bruits, or masses Cardiac: RRR; no murmurs, rubs, or gallops,no edema.  Intact distal pulses bilaterally.  Respiratory:  clear to auscultation bilaterally, normal work of breathing GI: soft, nontender, nondistended, + BS MS: no deformity or atrophy  Skin: warm and dry, no rash Neuro:  Alert and Oriented x 3, Strength and sensation are intact Psych: euthymic mood, full affect  Wt Readings from Last 3 Encounters:  05/15/16 175 lb 12.8 oz (79.7 kg)  05/14/16 165 lb (74.8 kg)  05/11/16 165 lb (74.8 kg)      Studies/Labs Reviewed:     Recent Labs: 02/02/2016: B Natriuretic Peptide 14.1 05/11/2016: ALT 23; BUN 13; Creatinine, Ser 1.11; Hemoglobin 15.3; Platelets 414; Potassium 3.5; Sodium 133   Lipid Panel    Component Value Date/Time   CHOL 131 12/25/2015 0830   TRIG 98 12/25/2015 0830   HDL 57 12/25/2015 0830   CHOLHDL 2.3 12/25/2015 0830   VLDL 20 12/25/2015 0830   LDLCALC 54 12/25/2015 0830    Additional studies/ records  that were reviewed today include:  CPAP download    ASSESSMENT:    1. OSA on CPAP   2. Obesity (BMI 30-39.9)      PLAN:  In order of problems listed above:  OSA - the patient is tolerating PAP therapy but is not very compliant because she has not been sleeping at night.  She had been on chronic Valium in the past but her PCP retired and now she has no sleep aide.  Her problem is mainly anxiety and she has tried multiple meds including Valium, ativan, seroquel, ambien and [place].  The only medicine that has worked is valium.  I have recommended that she see her PCP because her problem is severe anxiety.  She wants to be compliant with her CPAP because it did make her feel better when she was using the Valium and sleeping.   The PAP download was reviewed today and showed an AHI of 1/hr on 19 cm H2O with 3% compliance in using more than 4 hours nightly.  We have gotten her an appt with her new PCP today to discuss treatment of her anxiety.  I will get a download in 4 weeks. Obesity - I have encouraged him to get into a routine exercise program and cut back on carbs and portions.      Medication Adjustments/Labs and Tests Ordered: Current medicines are reviewed at length with the patient today.  Concerns regarding medicines are outlined above.  Medication changes, Labs and Tests ordered today are listed in the Patient Instructions below.  There are no Patient Instructions on file for this visit.   Signed, Fransico Him, MD  05/15/2016 8:42 AM    [facility] [address], [place], [place]  [address] Phone: [phone]; Fax: [phone]

## 2016-05-14 NOTE — ED Provider Notes (Signed)
Medical screening examination/treatment/procedure(s) were conducted as a shared visit with non-physician practitioner(s) and myself.  I personally evaluated the patient during the encounter.   EKG Interpretation None       Patient seen by me along with the physician assistant. Symptoms seem to be consistent with radicular neck pain. Symptoms just started yesterday. Does have some radiation of pain down and perform some numbness on the back of the fingers. No injury. Clinically seems to have no connection to a deep vein thrombosis although patient has a history of that in the past and currently not on blood thinners. No swelling to the hand radial pulses strong. Currently good range of motion and strength to the fingers.  We'll treat symptomatically with hydrocodone continue the Flexeril. Symptoms do not resolve will keep prednisone in reserve. She has a primary care doctor to follow-up with.   Fredia Sorrow, MD 05/14/16 1121

## 2016-05-15 ENCOUNTER — Encounter: Payer: Self-pay | Admitting: Nurse Practitioner

## 2016-05-15 ENCOUNTER — Ambulatory Visit (INDEPENDENT_AMBULATORY_CARE_PROVIDER_SITE_OTHER): Payer: Medicare PPO | Admitting: Cardiology

## 2016-05-15 ENCOUNTER — Encounter: Payer: Self-pay | Admitting: Cardiology

## 2016-05-15 ENCOUNTER — Ambulatory Visit (INDEPENDENT_AMBULATORY_CARE_PROVIDER_SITE_OTHER): Payer: Medicare PPO | Admitting: Nurse Practitioner

## 2016-05-15 VITALS — BP 136/82 | HR 90 | Temp 98.5°F | Ht 64.0 in | Wt 176.0 lb

## 2016-05-15 DIAGNOSIS — G4733 Obstructive sleep apnea (adult) (pediatric): Secondary | ICD-10-CM | POA: Diagnosis not present

## 2016-05-15 DIAGNOSIS — F411 Generalized anxiety disorder: Secondary | ICD-10-CM | POA: Diagnosis not present

## 2016-05-15 DIAGNOSIS — Z9989 Dependence on other enabling machines and devices: Secondary | ICD-10-CM | POA: Diagnosis not present

## 2016-05-15 DIAGNOSIS — G4700 Insomnia, unspecified: Secondary | ICD-10-CM | POA: Diagnosis not present

## 2016-05-15 DIAGNOSIS — E669 Obesity, unspecified: Secondary | ICD-10-CM

## 2016-05-15 MED ORDER — CITALOPRAM HYDROBROMIDE 20 MG PO TABS: 20.0000 mg | ORAL_TABLET | Freq: Every day | ORAL | 3 refills | Status: DC

## 2016-05-15 MED ORDER — SUVOREXANT 10 MG PO TABS: 1.0000 | ORAL_TABLET | Freq: Every day | ORAL | 0 refills | Status: DC

## 2016-05-15 NOTE — Patient Instructions (Addendum)
belsomra? Vistaril? celexa?

## 2016-05-15 NOTE — Progress Notes (Signed)
Pre visit review using our clinic review tool, if applicable. No additional management support is needed unless otherwise documented below in the visit note. 

## 2016-05-15 NOTE — Patient Instructions (Signed)
Medication Instructions:  Your physician recommends that you continue on your current medications as directed. Please refer to the Current Medication list given to you today.   Labwork: -None  Testing/Procedures: -None  Follow-Up: Your physician wants you to follow-up in: 6 months with Dr. Radford Pax.  You will receive a reminder letter in the mail two months in advance. If you don't receive a letter, please call our office to schedule the follow-up appointment.   Any Other Special Instructions Will Be Listed Below (If Applicable).   Cpap download in 4 weeks please.   If you need a refill on your cardiac medications before your next appointment, please call your pharmacy.

## 2016-05-15 NOTE — Progress Notes (Signed)
Subjective:  Patient ID: Maureen [name], female    DOB: 12-Feb-1970  Age: 46 y.o. MRN: [id number]  CC: Insomnia (sleeping issue,hx of sleep apnea--not using c pap machine consistently. )  HPI Insomnia: Fragmented sleep due to anxiety. Used ambien, ativan, trazodone, seroquel, xanax with no improvement. Unable to use CPAP as prescribed.  GAD: Chronic, due to to health concerns. Used Klonopin, valium, prozac, celexa used in past. celexa x 2years prozac x 70months. Stopped medications bacause she was doing better. Used psychotherapy in past. Previous admission to inpatient [facility] due to bad divorce several years ago Denies any HI and SI.  Outpatient Medications Prior to Visit  Medication Sig Dispense Refill  . acetaminophen (TYLENOL) 500 MG tablet Take 1 tablet (500 mg total) by mouth every 8 (eight) hours as needed for headache. 30 tablet 0  . atorvastatin (LIPITOR) 40 MG tablet Take 40 mg by mouth at bedtime.     . gabapentin (NEURONTIN) 300 MG capsule Take 300 mg by mouth daily as needed (for pain).   2  . insulin aspart (NOVOLOG) 100 UNIT/ML injection Inject 8 Units into the skin 3 (three) times daily with meals. 10 mL 0  . insulin glargine (LANTUS) 100 UNIT/ML injection Inject 0.28 mLs (28 Units total) into the skin at bedtime. (Patient taking differently: Inject 30 Units into the skin at bedtime. ) 10 mL 0  . lidocaine (LIDODERM) 5 % Place 1 patch onto the skin daily as needed (pain). Remove after 12 hours.  0  . ondansetron (ZOFRAN) 4 MG tablet Take 1 tablet (4 mg total) by mouth every 6 (six) hours. 12 tablet 0  . oxyCODONE (OXY IR/ROXICODONE) 5 MG immediate release tablet Take 1 tablet (5 mg total) by mouth 4 (four) times daily as needed for severe pain. 120 tablet 0  . famotidine (PEPCID) 20 MG tablet Take 1 tablet (20 mg total) by mouth 2 (two) times daily. (Patient taking differently: Take 20 mg by mouth 2 (two) times daily as needed for heartburn or indigestion. ) 60 tablet 6   . rivaroxaban (XARELTO) 20 MG TABS tablet Take 1 tablet (20 mg total) by mouth daily with supper. (Patient not taking: Reported on 05/15/2016) 30 tablet 11   No facility-administered medications prior to visit.     ROS See HPI  Objective:  BP 136/82   Pulse 90   Temp 98.5 F (36.9 C)   Ht 5\' 4"  (1.626 m)   Wt 176 lb (79.8 kg)   SpO2 99%   BMI 30.21 kg/m   BP Readings from Last 3 Encounters:  05/15/16 136/82  05/15/16 118/76  05/14/16 130/77    Wt Readings from Last 3 Encounters:  05/15/16 176 lb (79.8 kg)  05/15/16 175 lb 12.8 oz (79.7 kg)  05/14/16 165 lb (74.8 kg)    Physical Exam  Constitutional: She is oriented to person, place, and time. No distress.  Neck: Normal range of motion. Neck supple. No thyromegaly present.  Cardiovascular: Normal rate, regular rhythm and normal heart sounds.   Pulmonary/Chest: Effort normal and breath sounds normal.  Musculoskeletal: She exhibits no edema.  Neurological: She is alert and oriented to person, place, and time.  Skin: Skin is warm.  Vitals reviewed.   Lab Results  Component Value Date   WBC 15.6 (H) 05/11/2016   HGB 15.3 (H) 05/11/2016   HCT 44.0 05/11/2016   PLT 414 (H) 05/11/2016   GLUCOSE 259 (H) 05/11/2016   CHOL 131 12/25/2015   TRIG  98 12/25/2015   HDL 57 12/25/2015   LDLCALC 54 12/25/2015   ALT 23 05/11/2016   AST 18 05/11/2016   NA 133 (L) 05/11/2016   K 3.5 05/11/2016   CL 93 (L) 05/11/2016   CREATININE 1.11 (H) 05/11/2016   BUN 13 05/11/2016   CO2 27 05/11/2016   TSH 5.216 (H) 05/18/2012   INR 0.92 11/19/2015   HGBA1C 9.3 (H) 03/05/2016   MICROALBUR 1.5 03/05/2016    No results found.  Assessment & Plan:   Adalai was seen today for insomnia.  Diagnoses and all orders for this visit:  GAD (generalized anxiety disorder) -     citalopram (CELEXA) 20 MG tablet; Take 1 tablet (20 mg total) by mouth daily. -     Ambulatory referral to Psychology  Insomnia, unspecified type -      Suvorexant (BELSOMRA) 10 MG TABS; Take 1 tablet by mouth daily.   I have discontinued Ms. Hizer's famotidine and rivaroxaban. I am also having her start on Suvorexant and citalopram. Additionally, I am having her maintain her atorvastatin, lidocaine, acetaminophen, gabapentin, insulin aspart, insulin glargine, oxyCODONE, and ondansetron.  Meds ordered this encounter  Medications  . Suvorexant (BELSOMRA) 10 MG TABS    Sig: Take 1 tablet by mouth daily.    Dispense:  30 tablet    Refill:  0    Order Specific Question:   Supervising Provider    Answer:   Cassandria Anger [[id number]]  . citalopram (CELEXA) 20 MG tablet    Sig: Take 1 tablet (20 mg total) by mouth daily.    Dispense:  30 tablet    Refill:  3    Order Specific Question:   Supervising Provider    Answer:   Cassandria Anger [[id number]]    Follow-up: Return in about 2 weeks (around 05/29/2016) for anxiety and insomnia with greg calone.  Wilfred Lacy, NP

## 2016-05-19 ENCOUNTER — Ambulatory Visit (HOSPITAL_BASED_OUTPATIENT_CLINIC_OR_DEPARTMENT_OTHER): Payer: Medicare PPO | Admitting: Family

## 2016-05-19 ENCOUNTER — Ambulatory Visit: Payer: Medicare PPO

## 2016-05-19 ENCOUNTER — Telehealth: Payer: Self-pay | Admitting: Pharmacist

## 2016-05-19 ENCOUNTER — Other Ambulatory Visit: Payer: Self-pay | Admitting: Family

## 2016-05-19 ENCOUNTER — Other Ambulatory Visit (HOSPITAL_BASED_OUTPATIENT_CLINIC_OR_DEPARTMENT_OTHER): Payer: Medicare PPO

## 2016-05-19 VITALS — BP 127/75 | HR 89 | Temp 98.5°F | Resp 18 | Wt 175.0 lb

## 2016-05-19 DIAGNOSIS — I82412 Acute embolism and thrombosis of left femoral vein: Secondary | ICD-10-CM

## 2016-05-19 DIAGNOSIS — Z86718 Personal history of other venous thrombosis and embolism: Secondary | ICD-10-CM | POA: Diagnosis not present

## 2016-05-19 DIAGNOSIS — Z86711 Personal history of pulmonary embolism: Secondary | ICD-10-CM | POA: Diagnosis not present

## 2016-05-19 DIAGNOSIS — Z7901 Long term (current) use of anticoagulants: Secondary | ICD-10-CM

## 2016-05-19 LAB — CBC WITH DIFFERENTIAL (CANCER CENTER ONLY)
BASO#: 0.1 10*3/uL (ref 0.0–0.2)
BASO%: 0.6 % (ref 0.0–2.0)
EOS%: 1.2 % (ref 0.0–7.0)
Eosinophils Absolute: 0.1 10*3/uL (ref 0.0–0.5)
HCT: 44.2 % (ref 34.8–46.6)
HGB: 14.9 g/dL (ref 11.6–15.9)
LYMPH#: 2.8 10*3/uL (ref 0.9–3.3)
LYMPH%: 27.9 % (ref 14.0–48.0)
MCH: 32 pg (ref 26.0–34.0)
MCHC: 33.7 g/dL (ref 32.0–36.0)
MCV: 95 fL (ref 81–101)
MONO#: 0.5 10*3/uL (ref 0.1–0.9)
MONO%: 4.7 % (ref 0.0–13.0)
NEUT#: 6.6 10*3/uL — ABNORMAL HIGH (ref 1.5–6.5)
NEUT%: 65.6 % (ref 39.6–80.0)
Platelets: 449 10*3/uL — ABNORMAL HIGH (ref 145–400)
RBC: 4.66 10*6/uL (ref 3.70–5.32)
RDW: 12.5 % (ref 11.1–15.7)
WBC: 10 10*3/uL (ref 3.9–10.0)

## 2016-05-19 LAB — CMP (CANCER CENTER ONLY)
ALT(SGPT): 26 U/L (ref 10–47)
AST: 24 U/L (ref 11–38)
Albumin: 3.6 g/dL (ref 3.3–5.5)
Alkaline Phosphatase: 137 U/L — ABNORMAL HIGH (ref 26–84)
BUN, Bld: 7 mg/dL (ref 7–22)
CO2: 29 mEq/L (ref 18–33)
Calcium: 9.9 mg/dL (ref 8.0–10.3)
Chloride: 97 mEq/L — ABNORMAL LOW (ref 98–108)
Creat: 0.7 mg/dl (ref 0.6–1.2)
Glucose, Bld: 125 mg/dL — ABNORMAL HIGH (ref 73–118)
Potassium: 4.4 mEq/L (ref 3.3–4.7)
Sodium: 142 mEq/L (ref 128–145)
Total Bilirubin: 0.5 mg/dl (ref 0.20–1.60)
Total Protein: 7.8 g/dL (ref 6.4–8.1)

## 2016-05-19 MED ORDER — APIXABAN 5 MG PO TABS: 5.0000 mg | ORAL_TABLET | Freq: Two times a day (BID) | ORAL | 4 refills | Status: DC

## 2016-05-19 NOTE — Telephone Encounter (Signed)
Samples given today: Eliquis 5 mg  3 boxes x 14 tabs each = 42 tablets Lot:  [id number] Exp:  6/19

## 2016-05-19 NOTE — Progress Notes (Signed)
Hematology/Oncology Consultation   Name: Maureen [name]      MRN: [id number]    Location: Room/bed info not found  Date: 05/19/2016 Time:11:38 AM   REFERRING PHYSICIAN: Golden Circle, FNP    REASON FOR CONSULT: Acute DVT of femoral vein in left lower extremity    DIAGNOSIS:  1. Acute DVT of femoral vein in left lower extremity - resolved on 04/13/2016 [place]  2. History of PE 3. History of TIA  HISTORY OF PRESENT ILLNESS: Ms. Maureen [name] is a very pleasant 46 yo [place] American female with history of PE in the summer of 2016. She was treated with Xarelto for 4 months and then stopped. Her hypercoagulable work up at that time was negative.  She then fractured her foot in December 2017 and had an ORIF. She developed a DVT in her left lower extremity in January 2018. She started treatment with Xarelto again at that time. She states that she is on disability and a fixed income and can not afford the Xarelto and stopped taking it after she ran out a few weeks ago. [place] in March showed no evidence of DVT.  She has some tenderness in the left leg that comes and goes. No redness or edema present on exam. Pedal pulses are +2.  She states that after having a hysterectomy in her early 68's due to endometriosis she had a TIA.  She has history of several miscarriages. She has 2 adult sons that are both healthy young men.  She is a type I diabetic and states that her blood sugars are fairly well controlled on insulin. Her most recent Hgb A1c was 9.3.  Her father has a history of PE and DVT's and states that most have occurred after surgery. She also had 2 cousins that passed away due to PE.  No personal history of cancer. Her father has history of lymphoma which was felt to be caused by his exposure to Agent Orange during [place].  No fever, chills, n/v, cough, rash, dizziness, SOB, chest pain, palpitations, abdominal pain or changes in bowel or bladder habits.  She had no episodes of bleeding, bruising or  petechial rash while on anticoagulation.  No lymphadenopathy found on exam.  No swelling in her extremities. She has neuropathy in her feet that waxes and wanes. This is unchanged.  She has maintained a good appetite but admits that she needs to drink more fluids. Her weight is stable.     ROS: All other 10 point review of systems is negative.   PAST MEDICAL HISTORY:   Past Medical History:  Diagnosis Date  . Abdominal pain   . Achilles tendon contracture, left    with lisfrabc fracture  . Anxiety   . Arthritis   . Biliary dyskinesia   . Cervical strain   . Closed head injury 2010  . Depression   . Elevated LFTs   . Fibromyalgia   . GERD (gastroesophageal reflux disease)   . History of kidney stones   . Hyperlipidemia   . Hypoglycemia   . Joint pain   . Major depressive disorder   . Migraine    hx of none recent  . Nausea & vomiting   . OSA on CPAP 05/14/2016   Moderate to severe OSA with an AHI of 28/hr now on CPAP at 19cm H2O  . Pneumonia   . Post traumatic stress disorder (PTSD)   . Pulmonary embolism ([place])   . Renal stones   . Skin rash  due to medications  . Sleep trouble   . Stroke [facility])    " series of mini strokes around 2007"  . TIA (transient ischemic attack)   . Wears glasses   . 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 Cephalexin Anaphylaxis, Shortness Of Breath, Rash and Other (See Comments)    Pt was admitted to the hospital upon taking.   . Ibuprofen Nausea And Vomiting and Rash  . Meloxicam Nausea Only      MEDICATIONS:  Current Outpatient Prescriptions on File Prior to Visit  Medication Sig Dispense Refill  . acetaminophen (TYLENOL) 500 MG tablet Take 1 tablet (500 mg total) by mouth every 8 (eight) hours as needed for headache. 30 tablet 0  . atorvastatin (LIPITOR) 40 MG tablet Take 40 mg by mouth at bedtime.     . citalopram (CELEXA) 20 MG tablet  Take 1 tablet (20 mg total) by mouth daily. 30 tablet 3  . gabapentin (NEURONTIN) 300 MG capsule Take 300 mg by mouth daily as needed (for pain).   2  . insulin aspart (NOVOLOG) 100 UNIT/ML injection Inject 8 Units into the skin 3 (three) times daily with meals. 10 mL 0  . insulin glargine (LANTUS) 100 UNIT/ML injection Inject 0.28 mLs (28 Units total) into the skin at bedtime. (Patient taking differently: Inject 30 Units into the skin at bedtime. ) 10 mL 0  . lidocaine (LIDODERM) 5 % Place 1 patch onto the skin daily as needed (pain). Remove after 12 hours.  0  . Suvorexant (BELSOMRA) 10 MG TABS Take 1 tablet by mouth daily. 30 tablet 0   No current facility-administered medications on file prior to visit.      PAST SURGICAL HISTORY Past Surgical History:  Procedure Laterality Date  . ABDOMINAL HYSTERECTOMY  2001  . APPENDECTOMY    . [place]  . CHOLECYSTECTOMY  02/10/2011   Procedure: LAPAROSCOPIC CHOLECYSTECTOMY WITH INTRAOPERATIVE CHOLANGIOGRAM;  Surgeon: Judieth Keens, DO;  Location: [facility];  Service: General;  Laterality: N/A;  . COLONOSCOPY    . DILATION AND CURETTAGE OF UTERUS    . exploratory laparomty  Mar 04, 2012  . EYE SURGERY  2006   laser eye surgery left eye  . KNEE SURGERY Left 2012  . LAPAROSCOPIC APPENDECTOMY N/A 10/12/2012   Procedure: DIAGNOSTIC APPENDECTOMY LAPAROSCOPIC;  Surgeon: Madilyn Hook, DO;  Location: [facility];  Service: General;  Laterality: N/A;  . lipoma removed  yrs ago  . LIVER BIOPSY    . ORIF ANKLE FRACTURE Left 01/03/2016   Procedure: OPEN REDUCTION INTERNAL FIXATION (ORIF) LEFT LISFRANC JOINT, GASTROC RECESSION;  Surgeon: Newt Minion, MD;  Location: [facility];  Service: Orthopedics;  Laterality: Left;  . ROBOTIC ASSISTED LAPAROSCOPIC LYSIS OF ADHESION N/A 03/04/2012   Procedure: ROBOTIC ASSISTED LAPAROSCOPIC LYSIS OF EXTENSIVE ADHESIONS;  Surgeon: Claiborne Billings A. Pamala Hurry, MD;  Location: [facility];  Service: Gynecology;  Laterality: N/A;   . TONSILLECTOMY  2001    FAMILY HISTORY: Family History  Problem Relation Age of Onset  . Cancer Father     lymphoma  . Nephrolithiasis Father   . Vascular Disease Father   . Alcohol abuse Father   . Drug abuse Father   . Cancer Maternal Grandmother     colon  . Hypertension Mother   . Heart disease Mother   . Cataracts Mother   . Depression  Mother   . Diabetes Brother   . Drug abuse Brother   . Mental illness Maternal Grandfather   . Cancer Maternal Grandfather   . Heart disease Paternal Grandmother   . Heart disease Paternal Grandfather   . Suicidality Maternal Uncle     SOCIAL HISTORY:  reports that she has never smoked. She has never used smokeless tobacco. She reports that she drinks alcohol. She reports that she does not use drugs.  PERFORMANCE STATUS: The patient's performance status is 1 - Symptomatic but completely ambulatory  PHYSICAL EXAM: Most Recent Vital Signs: There were no vitals taken for this visit. BP 127/75 (BP Location: Right Arm, Patient Position: Sitting)   Pulse 89   Temp 98.5 F (36.9 C) (Oral)   Resp 18   Wt 175 lb (79.4 kg)   SpO2 100%   BMI 30.04 kg/m   General Appearance:    Alert, cooperative, no distress, appears stated age  Head:    Normocephalic, without obvious abnormality, atraumatic  Eyes:    PERRL, conjunctiva/corneas clear, EOM's intact, fundi    benign, both eyes        Throat:   Lips, mucosa, and tongue normal; teeth and gums normal  Neck:   Supple, symmetrical, trachea midline, no adenopathy;    thyroid:  no enlargement/tenderness/nodules; no carotid   bruit or JVD  Back:     Symmetric, no curvature, ROM normal, no CVA tenderness  Lungs:     Clear to auscultation bilaterally, respirations unlabored  Chest Wall:    No tenderness or deformity   Heart:    Regular rate and rhythm, S1 and S2 normal, no murmur, rub   or gallop     Abdomen:     Soft, non-tender, bowel sounds active all four quadrants,    no masses, no  organomegaly        Extremities:   Extremities normal, atraumatic, no cyanosis or edema  Pulses:   2+ and symmetric all extremities  Skin:   Skin color, texture, turgor normal, no rashes or lesions  Lymph nodes:   Cervical, supraclavicular, and axillary nodes normal  Neurologic:   CNII-XII intact, normal strength, sensation and reflexes    throughout    LABORATORY DATA:  Results for orders placed or performed in visit on 05/19/16 (from the past 48 hour(s))  CBC w/Diff     Status: Abnormal   Collection Time: 05/19/16 11:13 AM  Result Value Ref Range   WBC 10.0 3.9 - 10.0 10e3/uL   RBC 4.66 3.70 - 5.32 10e6/uL   HGB 14.9 11.6 - 15.9 g/dL   HCT 44.2 34.8 - 46.6 %   MCV 95 81 - 101 fL   MCH 32.0 26.0 - 34.0 pg   MCHC 33.7 32.0 - 36.0 g/dL   RDW 12.5 11.1 - 15.7 %   Platelets 449 (H) 145 - 400 10e3/uL   NEUT# 6.6 (H) 1.5 - 6.5 10e3/uL   LYMPH# 2.8 0.9 - 3.3 10e3/uL   MONO# 0.5 0.1 - 0.9 10e3/uL   Eosinophils Absolute 0.1 0.0 - 0.5 10e3/uL   BASO# 0.1 0.0 - 0.2 10e3/uL   NEUT% 65.6 39.6 - 80.0 %   LYMPH% 27.9 14.0 - 48.0 %   MONO% 4.7 0.0 - 13.0 %   EOS% 1.2 0.0 - 7.0 %   BASO% 0.6 0.0 - 2.0 %      RADIOGRAPHY: No results found.     PATHOLOGY: None  ASSESSMENT/PLAN: Ms. Maureen [name] is a very pleasant 46  yo [place] American female with history of PE in 2016 treated for 4 months with Xarelto. She was diagnosed with a DVT of the left leg in January 2018 and put back on Xarelto. [place] in March showed her DVT has resolved. She will now need to be on life long anticoagulation. Unfortunately, she stopped Xarelto several weeks ago due to the cost. We have some samples and will get her onto Eliquis today. Our financial counselor, Baxter Flattery, will help her look for financial assistance.  Her only complaint at this time is occasional tenderness in her left leg. She is otherwise asymptomatic at this time.  Of note: she also has history of multiple miscarriages and TIA after hysterectomy.   We will  look and see if there are any newer hypercoagulable studies that we could check on her.   We will plan to see her back in 6 weeks for repeat lab work and follow-up.   All questions were answered. She will contact our office with any questions or concerns. We can certainly see her much sooner if necessary.  She was discussed with and also seen by Dr. Marin Olp and he is in agreement with the aforementioned.   [facility]     Addendum:  I saw and examined the patient with Moani Weipert. I agree with the above.  The real that we have is that she does not have the money to pay for the new oral anticoagulants. This really is a huge problem. I really do not want her on Coumadin. Coumadin, and the long run, would cost more given that we would have to keep running lab tests to make sure the level is therapeutic.  I will see if the drug rep for ELIQUIS will be able to provide the [facility] for free. Hopefully they will be able to.  She needs lifelong anticoagulation. We might have to do some more testing for some of the more esoteric causes of hypercoagulability. There is a family history. She has had multiple miscarriages. I would think that there might be some reason for her to have the recurrent thromboembolic disease.  Again, we need her on lifelong anticoagulation. I would have her on therapeutic anticoagulation for at least 6 months and then get her on maintenance low-dose anticoagulation.  We spent about 40 minutes with her. It was nice to see her again.  Lattie Haw, MD

## 2016-05-25 ENCOUNTER — Telehealth: Payer: Self-pay | Admitting: Family

## 2016-05-25 NOTE — Telephone Encounter (Signed)
I will no longer prescribe pain medication secondary to receiving narcotics from an additional provider and it not being in her urine drug screen. She will need to await pain management referral for further medication.We are happy to provide other primary care services.

## 2016-05-25 NOTE — Telephone Encounter (Signed)
According to [place] [facility]. Pt received a refill from you on 05/01/16 of 120 tablets of Oxycodone. Also on 05/14/16 received 10 tablets of Hydrocodone from a [employer]. Last UDS showed no sign of patient using pain meds.

## 2016-05-25 NOTE — Telephone Encounter (Signed)
Patient is still waiting on referral to go through to pain management.  Is requesting refill on oxycodone.  States she found out that the script given to her was written different (5 mg every 6 hours) than her original script of (5 mg every 4 hours).  Therefore she is requesting refill sooner.  Patient is requesting call back in regard.

## 2016-05-26 NOTE — Telephone Encounter (Signed)
Patient called back about this. I did inform her of the notes. She states that the "pain medication does not ever really show up in her drug screen, because she does not take it everyday." Patient states how is she going to wait 3 months for a referral to pain management, without any pain medication. She keep saying she needed to talk to Lovena Le or Marya Amsler about this.   I spoke with Lovena Le.   I called patient back to inform that no other pain medication would be prescribe. Due to no medication in drug screen and with filling another pain medication from a different doctor. As well patient was inform again she would have to wait for pain management. She states this is "unfair to go without". She also states the reason the medication was not in her system was because how her body works on it. She had to cut down the dose to half of what she took because she was running out. &Due to insurance reason she would not be able to get it filled. That must be why its not showing up. As far as the other doctor giving her a pain meds she states he told her to pick it up but she did not take it. Because she knew she could not due to being on the other medication.   Patient states what if she has with draws from the medication. She was informed we could give her medication for that or she could go the er if needed. She states if Marya Amsler changes his mind and would give her the pain meds to please let her know while she waits for this referrals.

## 2016-05-27 NOTE — Telephone Encounter (Signed)
Conversation noted and will await referral to pain management. If she was not taking it every 4-6 hours as she indicated the chances of withdrawals are lower.

## 2016-05-28 ENCOUNTER — Telehealth: Payer: Self-pay | Admitting: *Deleted

## 2016-05-28 NOTE — Telephone Encounter (Signed)
Patient was seen in this office last week and started on Eliquis. This past Tuesday she visited an Urgent Care for severe headache. She was prescribed tramadol, which she states has been effective, and was told to notify this office since the Eliquis is the only new exposure.  Spoke to Dr Marin Olp. Headache is not a known side effect from Eliquis.   Patient is to continue using the tramadol as prescribed to treat her headache. She is to continue taking her Eliquis. She can follow up with this office in one week if the headaches continue. She understands.

## 2016-05-30 IMAGING — CR DG CHEST 2V
2 series · 2 of 2 positions shown · non-contrast
Comparison: 03/26/2014

CLINICAL DATA: High blood sugar.

EXAM:
CHEST  2 VIEW

[chest pa]
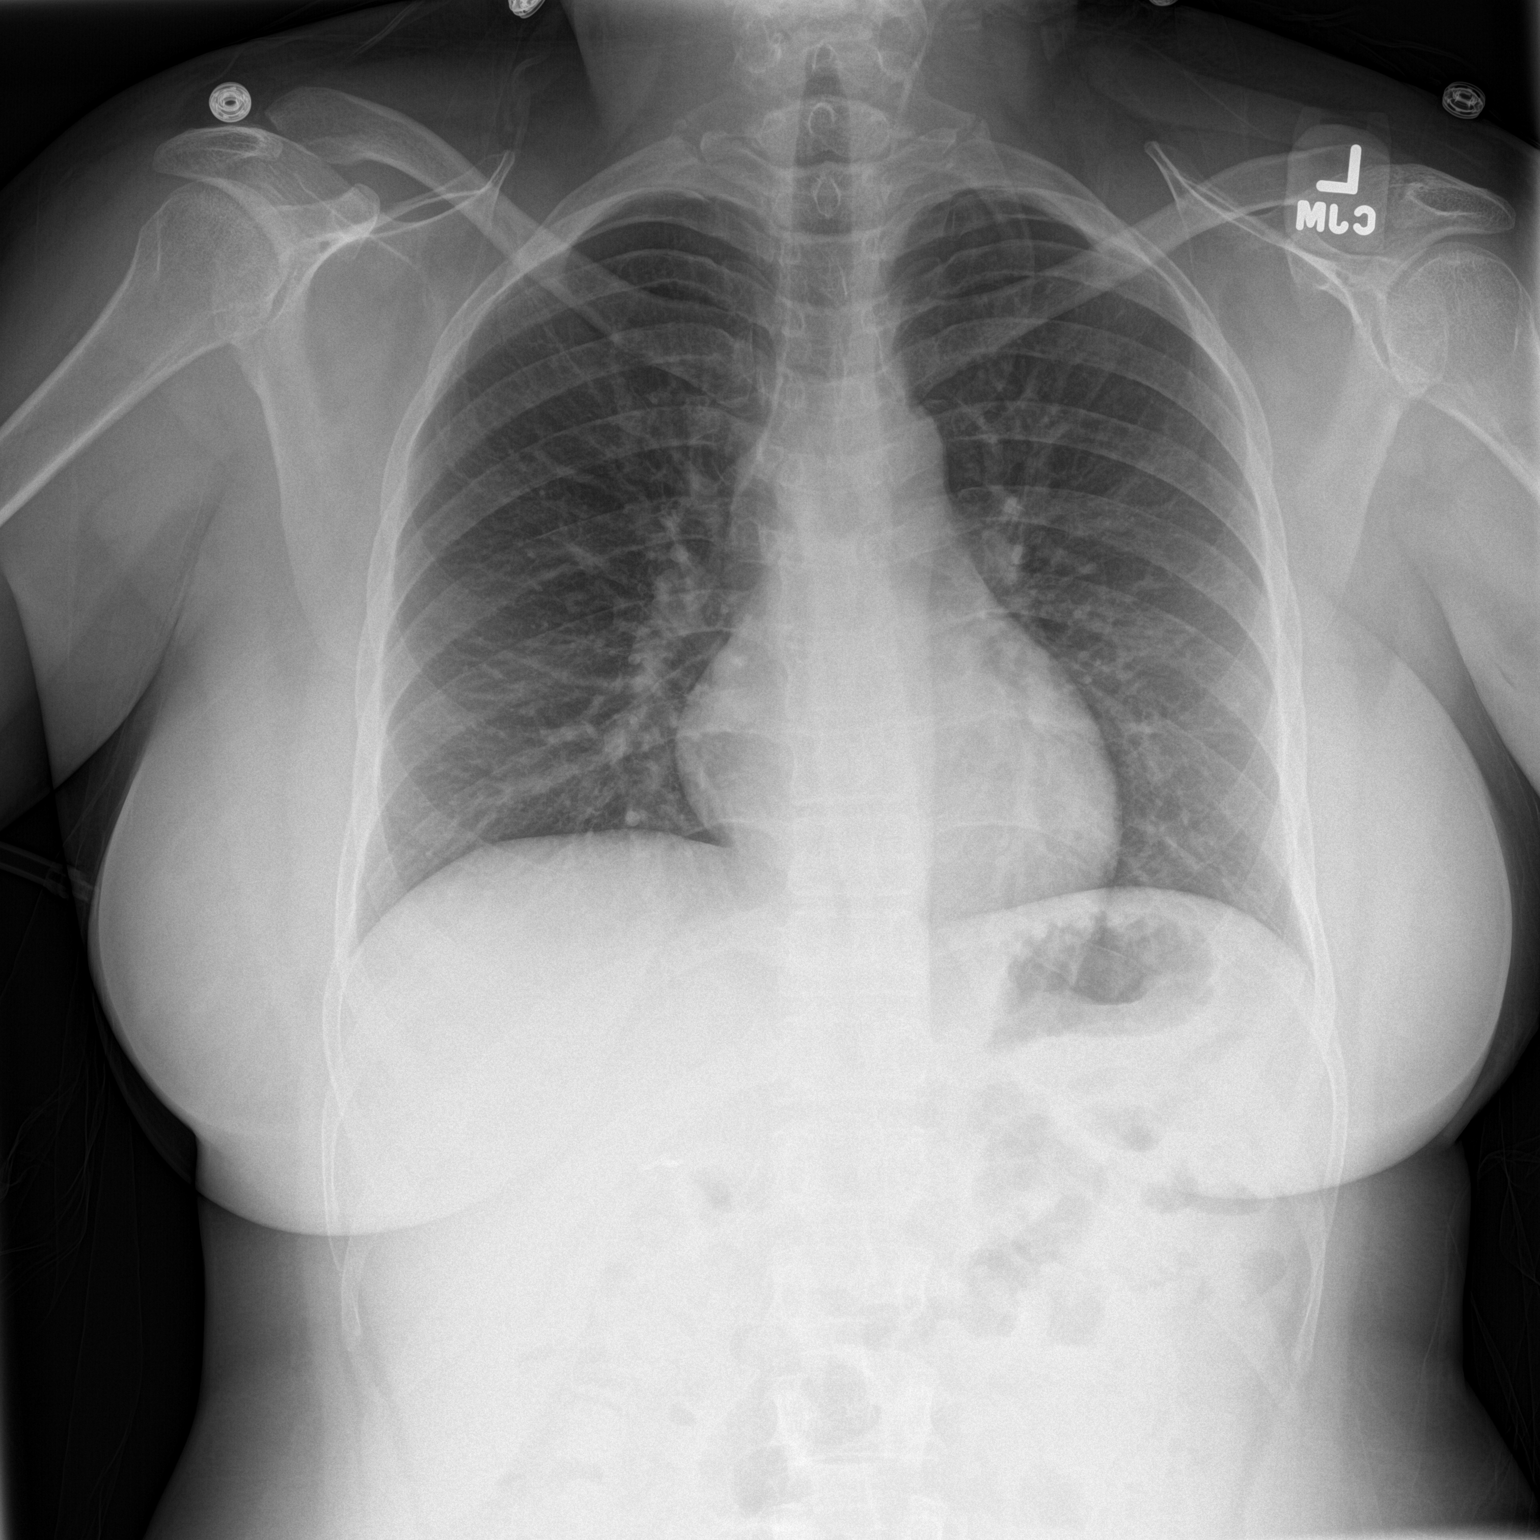

[chest lat]
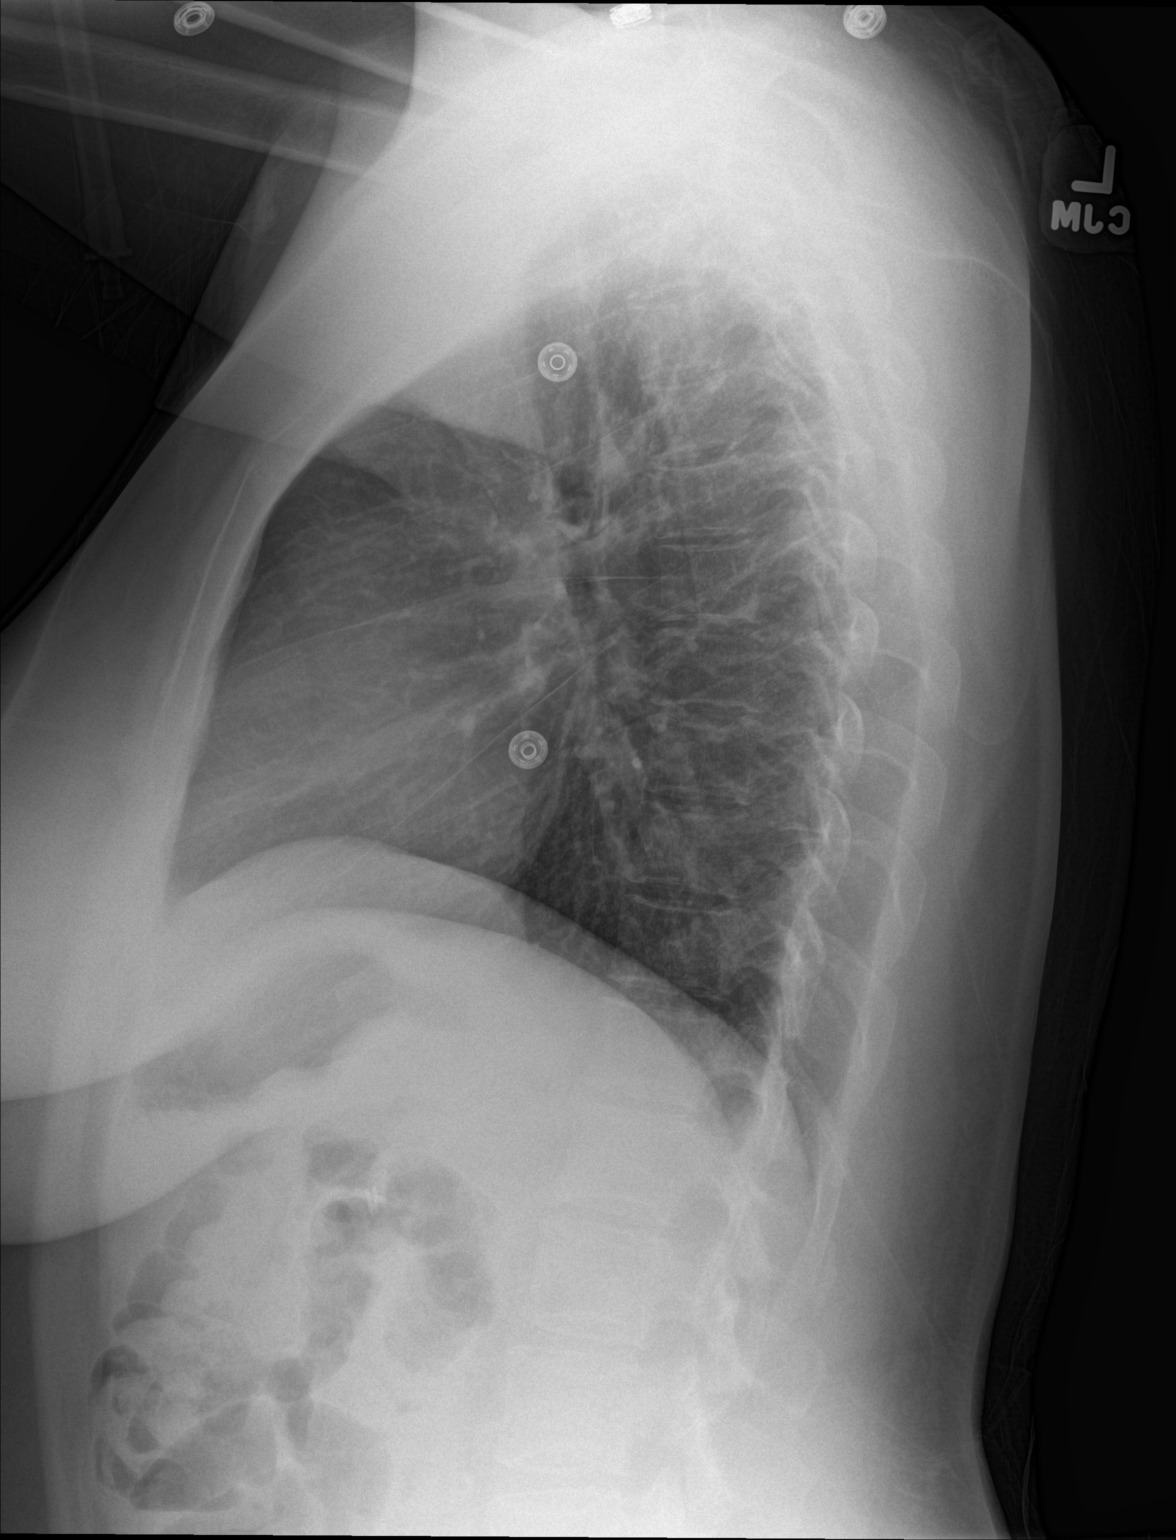

[2 of 2 positions shown; findings below may reference images not displayed]

FINDINGS: Normal heart size and mediastinal contours. No acute infiltrate or
edema. No effusion or pneumothorax. Large left cervical rib. No
acute osseous findings.
IMPRESSION: No active cardiopulmonary disease.

## 2016-06-01 ENCOUNTER — Encounter: Payer: Self-pay | Admitting: Family

## 2016-06-03 ENCOUNTER — Encounter: Payer: Self-pay | Admitting: Family

## 2016-06-03 DIAGNOSIS — G8929 Other chronic pain: Secondary | ICD-10-CM

## 2016-06-04 IMAGING — MR MR LUMBAR SPINE W/O CM
4 of 6 series · 20 of 48 positions shown · non-contrast
Comparison: CT abdomen and pelvis 01/30/2014

CLINICAL DATA: Progressive abdominal pain.  Fecal incontinence.

EXAM:
MRI LUMBAR SPINE WITHOUT CONTRAST
TECHNIQUE: Multiplanar, multisequence MR imaging of the lumbar spine was
performed. No intravenous contrast was administered.

[Series 200: T2 · sagittal · 3.0mm · 0.55mm/px · 4 of 13 slices shown (1 of 3)]
[im 1/13]
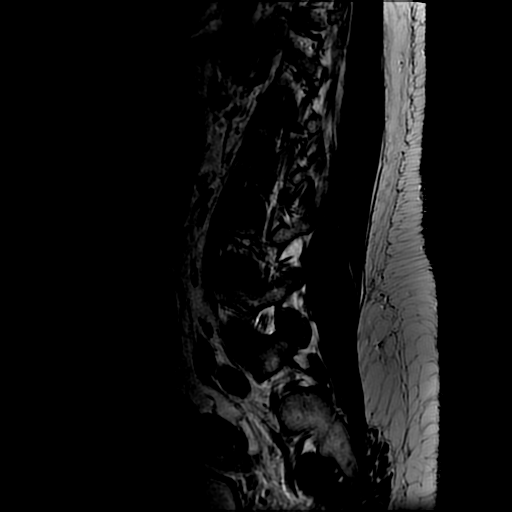
[im 5/13]
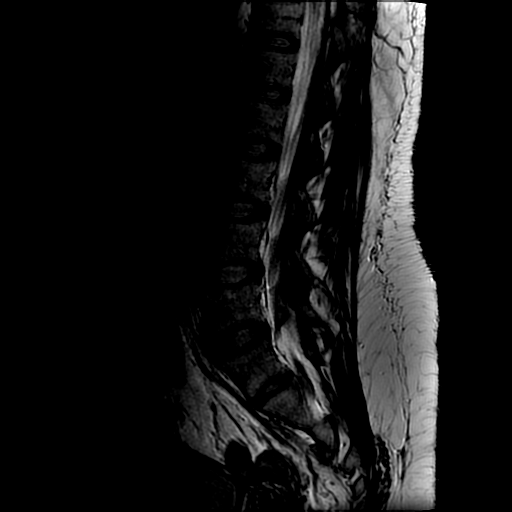
[im 9/13]
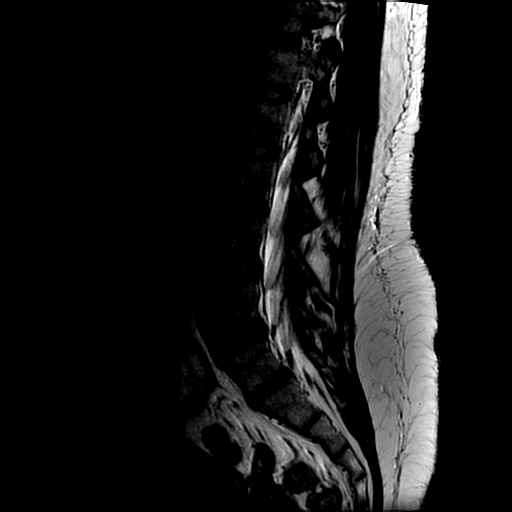
[im 13/13]
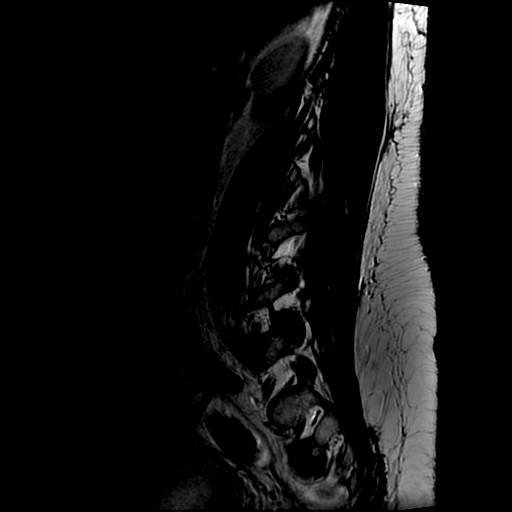

[Series 300: T1 · sagittal · 3.0mm · 0.55mm/px · 3 of 13 slices shown]
[im 1/13]
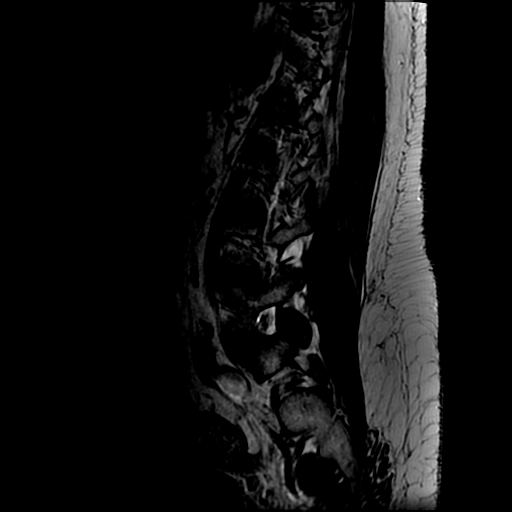
[im 9/13]
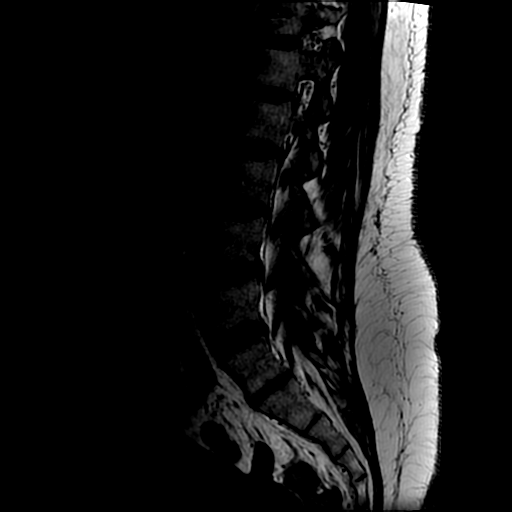
[im 13/13]
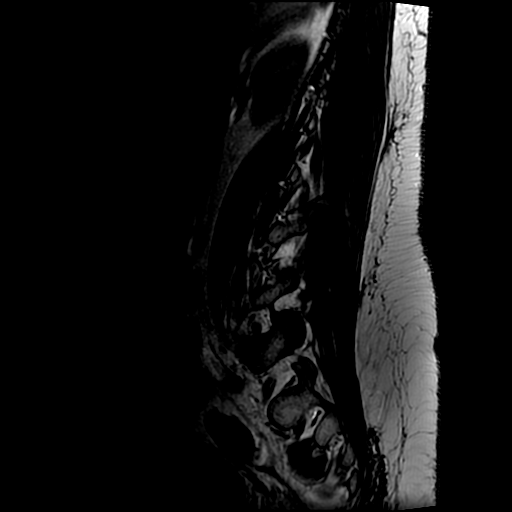

[Series 500: T2 · sagittal · 3.0mm · 0.55mm/px · 5 of 13 slices shown (2 of 3)]
[im 1/13]
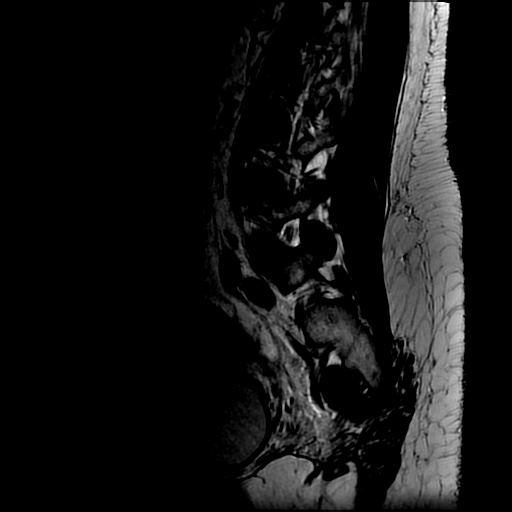
[im 4/13]
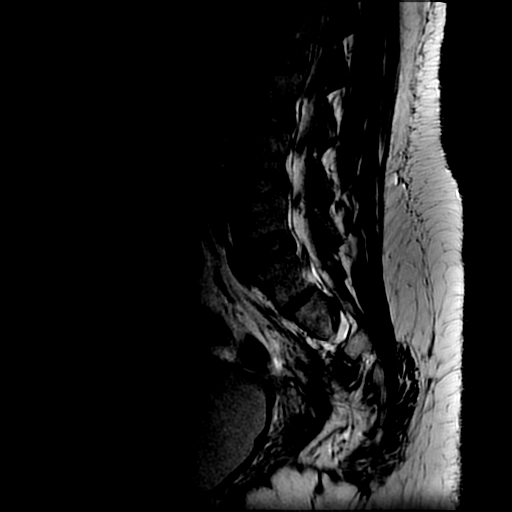
[im 7/13]
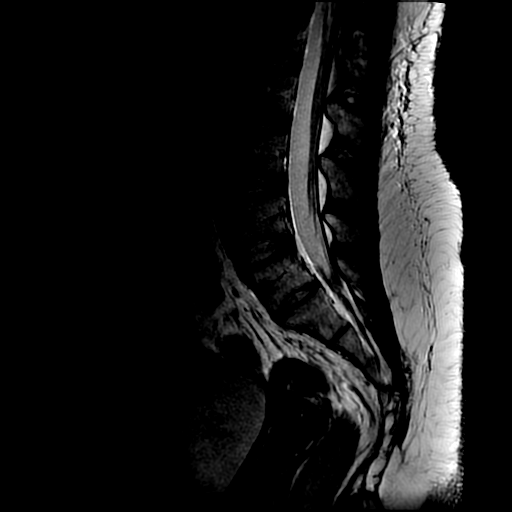
[im 10/13]
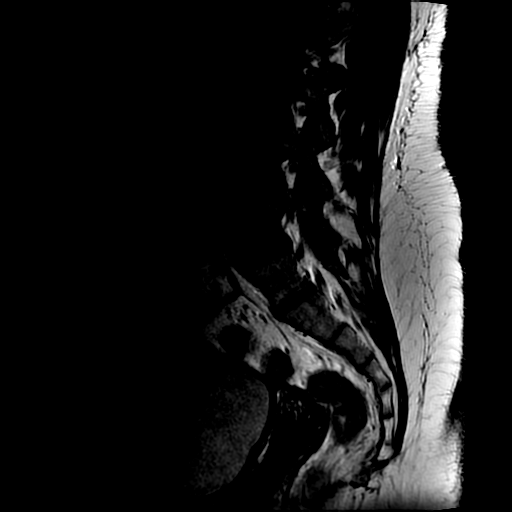
[im 13/13]
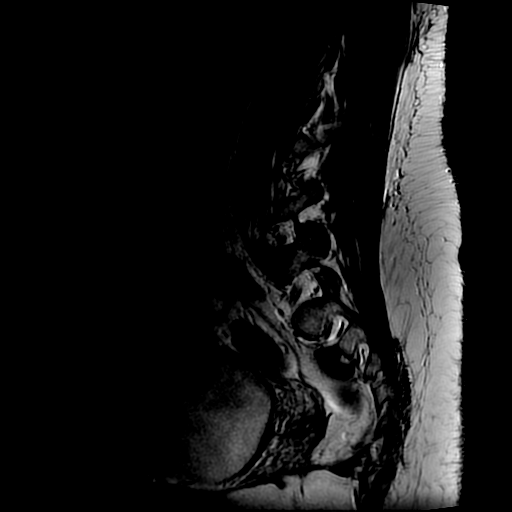

[Series 600: T2 · axial · 4.0mm · 0.39mm/px · z∈[-75,+109]mm · 8 of 40 slices shown (3 of 3)]
[im 1/40]
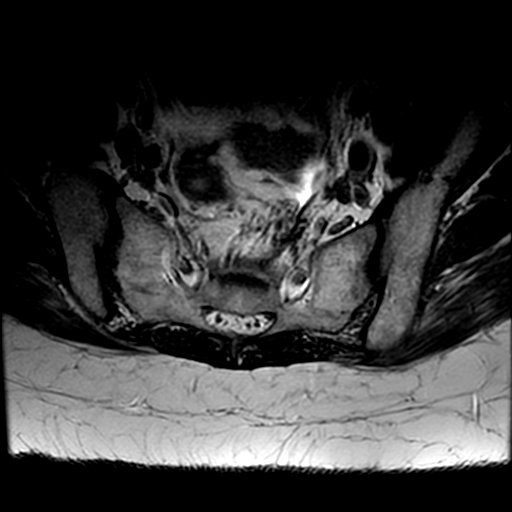
[im 6/40]
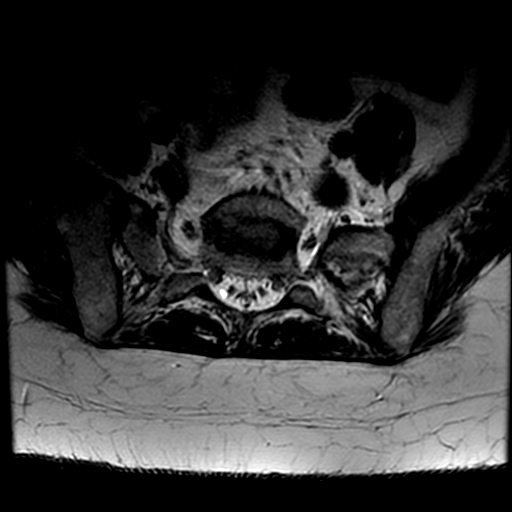
[im 12/40]
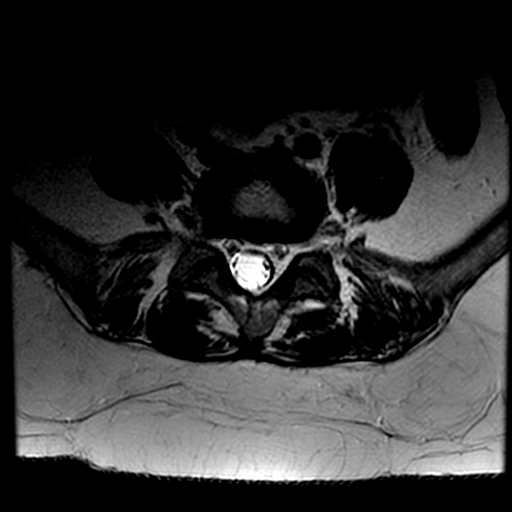
[im 17/40]
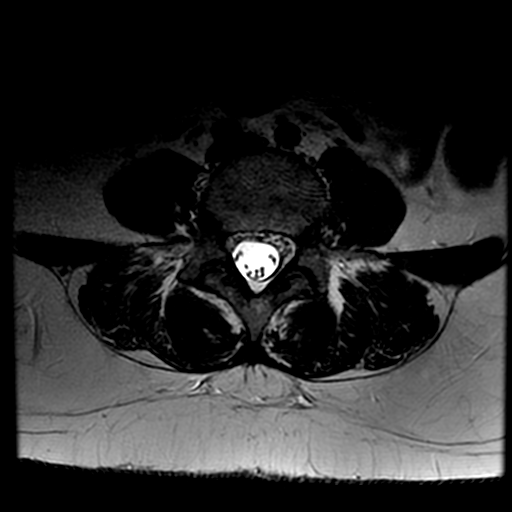
[im 20/40]
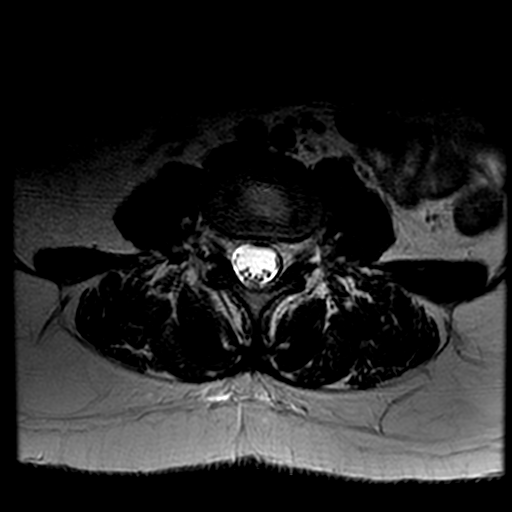
[im 23/40]
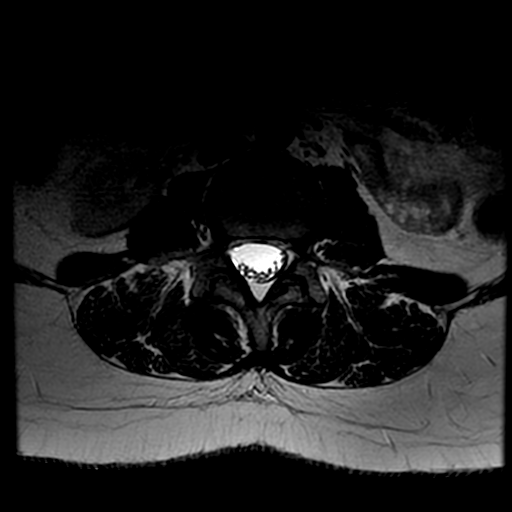
[im 28/40]
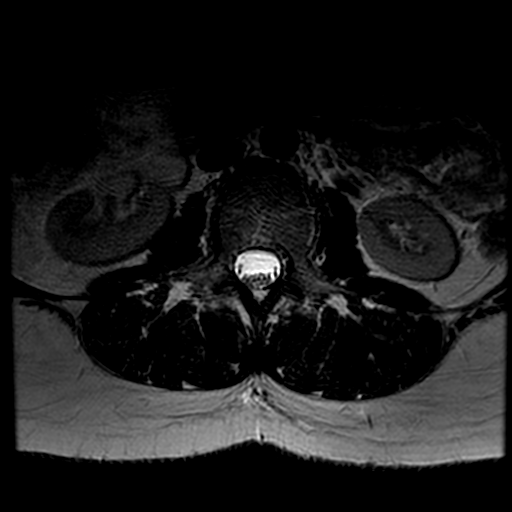
[im 34/40]
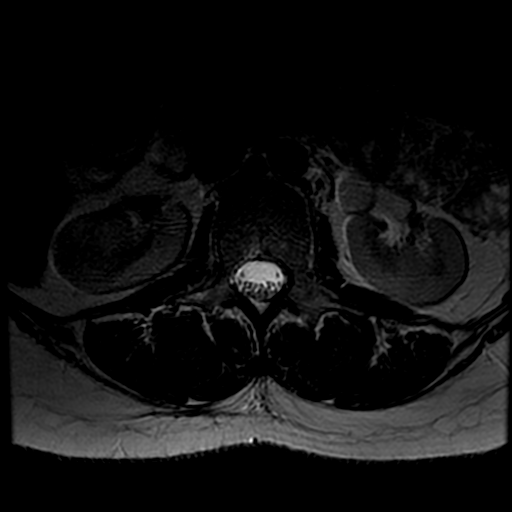

[20 of 48 positions shown; findings below may reference images not displayed]

FINDINGS: Normal signal is present in the conus medullaris which terminates at
L1-2. Marrow signal, vertebral body heights, alignment are normal.
Limited imaging of the abdomen is unremarkable. There is no
significant adenopathy.

No significant focal disc protrusion or stenosis is present. The
foramina are patent bilaterally.
IMPRESSION: Negative MRI of the lumbar spine.

## 2016-06-10 ENCOUNTER — Encounter: Payer: Self-pay | Admitting: Critical Care Medicine

## 2016-06-10 LAB — GLUCOSE, POCT (MANUAL RESULT ENTRY): POC Glucose: 351 mg/dl — AB (ref 70–99)

## 2016-06-17 ENCOUNTER — Encounter (HOSPITAL_BASED_OUTPATIENT_CLINIC_OR_DEPARTMENT_OTHER): Payer: Self-pay | Admitting: Emergency Medicine

## 2016-06-17 ENCOUNTER — Emergency Department (HOSPITAL_BASED_OUTPATIENT_CLINIC_OR_DEPARTMENT_OTHER)
Admission: EM | Admit: 2016-06-17 | Discharge: 2016-06-17 | Disposition: A | Discharge status: Home / Self Care | Payer: Medicare PPO | Attending: Emergency Medicine | Admitting: Emergency Medicine

## 2016-06-17 ENCOUNTER — Emergency Department (HOSPITAL_BASED_OUTPATIENT_CLINIC_OR_DEPARTMENT_OTHER): Payer: Medicare PPO

## 2016-06-17 DIAGNOSIS — Z794 Long term (current) use of insulin: Secondary | ICD-10-CM | POA: Insufficient documentation

## 2016-06-17 DIAGNOSIS — G8929 Other chronic pain: Secondary | ICD-10-CM | POA: Diagnosis not present

## 2016-06-17 DIAGNOSIS — R1084 Generalized abdominal pain: Secondary | ICD-10-CM | POA: Diagnosis present

## 2016-06-17 DIAGNOSIS — Z79899 Other long term (current) drug therapy: Secondary | ICD-10-CM | POA: Diagnosis not present

## 2016-06-17 DIAGNOSIS — E11319 Type 2 diabetes mellitus with unspecified diabetic retinopathy without macular edema: Secondary | ICD-10-CM | POA: Insufficient documentation

## 2016-06-17 DIAGNOSIS — E1110 Type 2 diabetes mellitus with ketoacidosis without coma: Secondary | ICD-10-CM | POA: Insufficient documentation

## 2016-06-17 DIAGNOSIS — E1142 Type 2 diabetes mellitus with diabetic polyneuropathy: Secondary | ICD-10-CM | POA: Insufficient documentation

## 2016-06-17 DIAGNOSIS — R110 Nausea: Secondary | ICD-10-CM | POA: Diagnosis not present

## 2016-06-17 DIAGNOSIS — M546 Pain in thoracic spine: Secondary | ICD-10-CM | POA: Insufficient documentation

## 2016-06-17 LAB — CBC WITH DIFFERENTIAL/PLATELET
Basophils Absolute: 0 10*3/uL (ref 0.0–0.1)
Basophils Relative: 0 %
Eosinophils Absolute: 0.1 10*3/uL (ref 0.0–0.7)
Eosinophils Relative: 1 %
HCT: 42.2 % (ref 36.0–46.0)
Hemoglobin: 14.6 g/dL (ref 12.0–15.0)
Lymphocytes Relative: 25 %
Lymphs Abs: 2.4 10*3/uL (ref 0.7–4.0)
MCH: 31.9 pg (ref 26.0–34.0)
MCHC: 34.6 g/dL (ref 30.0–36.0)
MCV: 92.1 fL (ref 78.0–100.0)
Monocytes Absolute: 0.5 10*3/uL (ref 0.1–1.0)
Monocytes Relative: 6 %
Neutro Abs: 6.6 10*3/uL (ref 1.7–7.7)
Neutrophils Relative %: 68 %
Platelets: 374 10*3/uL (ref 150–400)
RBC: 4.58 MIL/uL (ref 3.87–5.11)
RDW: 12.8 % (ref 11.5–15.5)
WBC: 9.7 10*3/uL (ref 4.0–10.5)

## 2016-06-17 LAB — URINALYSIS, MICROSCOPIC (REFLEX)

## 2016-06-17 LAB — COMPREHENSIVE METABOLIC PANEL
ALT: 32 U/L (ref 14–54)
AST: 22 U/L (ref 15–41)
Albumin: 3.8 g/dL (ref 3.5–5.0)
Alkaline Phosphatase: 139 U/L — ABNORMAL HIGH (ref 38–126)
Anion gap: 8 (ref 5–15)
BUN: 8 mg/dL (ref 6–20)
CO2: 31 mmol/L (ref 22–32)
Calcium: 9.5 mg/dL (ref 8.9–10.3)
Chloride: 99 mmol/L — ABNORMAL LOW (ref 101–111)
Creatinine, Ser: 0.92 mg/dL (ref 0.44–1.00)
GFR calc Af Amer: >60 mL/min (ref >60)
GFR calc non Af Amer: >60 mL/min (ref >60)
Glucose, Bld: 269 mg/dL — ABNORMAL HIGH (ref 65–99)
Potassium: 4.2 mmol/L (ref 3.5–5.1)
Sodium: 138 mmol/L (ref 135–145)
Total Bilirubin: 0.8 mg/dL (ref 0.3–1.2)
Total Protein: 7.8 g/dL (ref 6.5–8.1)

## 2016-06-17 LAB — URINALYSIS, ROUTINE W REFLEX MICROSCOPIC
Bilirubin Urine: NEGATIVE
Glucose, UA: >=500 mg/dL — AB
Ketones, ur: 15 mg/dL — AB
Leukocytes, UA: NEGATIVE
Nitrite: NEGATIVE
Protein, ur: 30 mg/dL — AB
Specific Gravity, Urine: 1.036 — ABNORMAL HIGH (ref 1.005–1.030)
pH: 6 (ref 5.0–8.0)

## 2016-06-17 LAB — LIPASE, BLOOD: Lipase: 21 U/L (ref 11–51)

## 2016-06-17 MED ORDER — SODIUM CHLORIDE 0.9 % IV BOLUS (SEPSIS)
1000.0000 mL | Freq: Once | INTRAVENOUS | Status: AC
  Administered 2016-06-17: 1000 mL via INTRAVENOUS

## 2016-06-17 MED ORDER — PROMETHAZINE HCL 25 MG/ML IJ SOLN
25.0000 mg | Freq: Once | INTRAMUSCULAR | Status: AC
  Administered 2016-06-17: 25 mg via INTRAVENOUS
  Filled 2016-06-17: qty 1

## 2016-06-17 MED ORDER — PROMETHAZINE HCL 25 MG PO TABS: 25.0000 mg | ORAL_TABLET | Freq: Four times a day (QID) | ORAL | 0 refills | Status: DC | PRN

## 2016-06-17 MED ORDER — MORPHINE SULFATE (PF) 4 MG/ML IV SOLN
4.0000 mg | Freq: Once | INTRAVENOUS | Status: AC
  Administered 2016-06-17: 4 mg via INTRAVENOUS
  Filled 2016-06-17: qty 1

## 2016-06-17 MED FILL — PROMETHAZINE 25 MG TABLET: 25 | 4 days supply | Qty: 15 | Fill #0

## 2016-06-17 NOTE — ED Triage Notes (Signed)
Right flank pain for over one week.  Pt having some difficulty urination.  Pt recently seen for same at [facility].  Pt states she is still having some nausea.

## 2016-06-17 NOTE — ED Notes (Addendum)
Patient transported to [facility] 

## 2016-06-17 NOTE — Discharge Instructions (Signed)
Read the information below.  Use the prescribed medication as directed.  Please discuss all new medications with your pharmacist.  You may return to the Emergency Department at any time for worsening condition or any new symptoms that concern you.    If you develop fevers, loss of control of bowel or bladder, weakness or numbness in your legs, or are unable to walk, return to the ER for a recheck.  °

## 2016-06-17 NOTE — ED Provider Notes (Signed)
[place] DEPT [facility] Provider Note   CSN: [id number] Arrival date & time: 06/17/16  0905     History   Chief Complaint Chief Complaint  Patient presents with  . Flank Pain    HPI Maureen [name] is a 46 y.o. female.  HPI   Pt with hx chronic abdominal pain, fibromyalgia, MDD, PE, Diabetes p/w bilateral thoracic back pain that has been ongoing for months but increased pain that is constant x 2 weeks, worse at night.  Was seen at [facility] for similar symptoms with CT abd/pelvis showing only kidney vascular calcification, no ureteral calculi.  Has had constant nausea, decreased PO intake.  Did have episode of loose stools, now resolved.  Also had morning with difficulty starting urinary stream, also resolved.  She currently has no associated symptoms.  Denies fevers, CP, SOB, cough, change in chronic abdominal pain, urinary ,vaginal, or bowel symptoms.  Has been in discussion with her PCP regarding pain management - is being referred to pain management.  Was previously on oxycodone until last month and has been changed to tramadol.   No medication changes.    Past Medical History:  Diagnosis Date  . Abdominal pain   . Achilles tendon contracture, left    with lisfrabc fracture  . Anxiety   . Arthritis   . Biliary dyskinesia   . Cervical strain   . Closed head injury 2010  . Depression   . Elevated LFTs   . Fibromyalgia   . GERD (gastroesophageal reflux disease)   . History of kidney stones   . Hyperlipidemia   . Hypoglycemia   . Joint pain   . Major depressive disorder   . Migraine    hx of none recent  . Nausea & vomiting   . OSA on CPAP 05/14/2016   Moderate to severe OSA with an AHI of 28/hr now on CPAP at 19cm H2O  . Pneumonia   . Post traumatic stress disorder (PTSD)   . Pulmonary embolism ([place])   . Renal stones   . Skin rash    due to medications  . Sleep trouble   . Stroke [facility])    " series of mini strokes around 2007"  . TIA (transient  ischemic attack)   . Wears glasses   . 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 OSA on CPAP 05/14/2016  . Obesity (BMI 30-39.9) 05/14/2016  . Acute deep vein thrombosis (DVT) of femoral vein of left lower extremity ([place]) 02/10/2016  . Diabetic polyneuropathy associated with type 2 diabetes mellitus ([place]) 02/10/2016  . Lisfranc dislocation, left, sequela 12/16/2015  . Short Achilles tendon (acquired), left ankle 12/16/2015  . Chest pain with moderate risk for cardiac etiology 11/29/2015  . Elevated troponin 11/19/2015  . Migraine headache 11/19/2015  . Neck pain 11/19/2015  . Acute kidney injury ([place]) 02/19/2015  . DKA (diabetic ketoacidoses) ([place]) 10/24/2014  . HLD (hyperlipidemia) 10/24/2014  . Abnormal LFTs 09/03/2014  . Dizziness 09/03/2014  . Acute pulmonary embolism ([place]) 09/03/2014  . Transaminitis 09/02/2014  . GAD (generalized anxiety disorder) 11/23/2013  . PTSD (post-traumatic stress disorder) 11/23/2013  . Severe major depression without psychotic features ([place]) 10/10/2013  . Endometriosis 12/08/2011  . Arthritis of left knee 12/08/2011  . Diabetic retinopathy ([place]) 12/08/2011  . Fibromyalgia 10/23/2011  . DM type 1 (diabetes mellitus, type 1) ([place]) 10/13/2011  Past Surgical History:  Procedure Laterality Date  . ABDOMINAL HYSTERECTOMY  2001  . APPENDECTOMY    . [place]  . CHOLECYSTECTOMY  02/10/2011   Procedure: LAPAROSCOPIC CHOLECYSTECTOMY WITH INTRAOPERATIVE CHOLANGIOGRAM;  Surgeon: Judieth Keens, DO;  Location: [place];  Service: General;  Laterality: N/A;  . COLONOSCOPY    . DILATION AND CURETTAGE OF UTERUS    . exploratory laparomty  Mar 04, 2012  . EYE SURGERY  2006   laser eye surgery left eye  . KNEE SURGERY Left 2012  . LAPAROSCOPIC APPENDECTOMY N/A 10/12/2012   Procedure: DIAGNOSTIC APPENDECTOMY LAPAROSCOPIC;  Surgeon: Madilyn Hook, DO;  Location: [facility];  Service: General;  Laterality: N/A;  . lipoma removed  yrs ago  . LIVER BIOPSY    . ORIF ANKLE FRACTURE Left 01/03/2016   Procedure: OPEN REDUCTION INTERNAL FIXATION (ORIF) LEFT LISFRANC JOINT, GASTROC RECESSION;  Surgeon: Newt Minion, MD;  Location: [place];  Service: Orthopedics;  Laterality: Left;  . ROBOTIC ASSISTED LAPAROSCOPIC LYSIS OF ADHESION N/A 03/04/2012   Procedure: ROBOTIC ASSISTED LAPAROSCOPIC LYSIS OF EXTENSIVE ADHESIONS;  Surgeon: Claiborne Billings A. Pamala Hurry, MD;  Location: [facility];  Service: Gynecology;  Laterality: N/A;  . TONSILLECTOMY  2001    OB History    No data available       Home Medications    Prior to Admission medications   Medication Sig Start Date End Date Taking? Authorizing Provider  acetaminophen (TYLENOL) 500 MG tablet Take 1 tablet (500 mg total) by mouth every 8 (eight) hours as needed for headache. 11/20/15   Arrien, Jimmy Picket, MD  apixaban (ELIQUIS) 5 MG TABS tablet Take 1 tablet (5 mg total) by mouth 2 (two) times daily. 05/19/16   [place], Holli Humbles, NP  atorvastatin (LIPITOR) 40 MG tablet Take 40 mg by mouth at bedtime.     [provider]  citalopram (CELEXA) 20 MG tablet Take 1 tablet (20 mg total) by mouth daily. 05/15/16   [place], Charlene Brooke, NP  gabapentin (NEURONTIN) 300 MG capsule Take 300 mg by mouth daily as needed (for pain).  12/11/15   [provider]  insulin aspart (NOVOLOG) 100 UNIT/ML injection Inject 8 Units into the skin 3 (three) times daily with meals. 03/05/16   [place], FNP  insulin glargine (LANTUS) 100 UNIT/ML injection Inject 0.28 mLs (28 Units total) into the skin at bedtime. Patient taking differently: Inject 30 Units into the skin at bedtime.  03/05/16   [place], FNP  lidocaine (LIDODERM) 5 % Place 1 patch onto the skin daily as needed (pain). Remove after 12 hours. 10/30/15   [provider]  promethazine (PHENERGAN) 25 MG tablet Take 1 tablet (25 mg  total) by mouth every 6 (six) hours as needed for nausea. 06/17/16   Clayton Bibles, PA-C  Suvorexant (BELSOMRA) 10 MG TABS Take 1 tablet by mouth daily. 05/15/16   [place], Charlene Brooke, NP    Family History Family History  Problem Relation Age of Onset  . Cancer Father        lymphoma  . Nephrolithiasis Father   . Vascular Disease Father   . Alcohol abuse Father   . Drug abuse Father   . Cancer Maternal Grandmother        colon  . Hypertension Mother   . Heart disease Mother   . Cataracts Mother   . Depression Mother   . Diabetes Brother   . Drug abuse Brother   .  Mental illness Maternal Grandfather   . Cancer Maternal Grandfather   . Heart disease Paternal Grandmother   . Heart disease Paternal Grandfather   . Suicidality Maternal Uncle     Social History Social History  Substance Use Topics  . Smoking status: Never Smoker  . Smokeless tobacco: Never Used  . Alcohol use Yes     Comment: Socially     Allergies   Cephalexin; Ibuprofen; and Meloxicam   Review of Systems Review of Systems  All other systems reviewed and are negative.    Physical Exam Updated Vital Signs BP (!) 152/86 (BP Location: Right Arm)   Pulse 72 Comment: Simultaneous filing. User may not have seen previous data.  Temp 98.6 F (37 C) (Oral)   Resp 16   Ht 5\' 4"  (1.626 m)   Wt 79.4 kg (175 lb)   SpO2 100% Comment: Simultaneous filing. User may not have seen previous data.  BMI 30.04 kg/m   Physical Exam  Constitutional: She appears well-developed and well-nourished. No distress.  HENT:  Head: Normocephalic and atraumatic.  Neck: Neck supple.  Cardiovascular: Normal rate and regular rhythm.   Pulmonary/Chest: Effort normal and breath sounds normal. No respiratory distress. She has no wheezes. She has no rales.  Abdominal: Soft. She exhibits no distension. There is no tenderness. There is no rebound and no guarding.  Musculoskeletal:  Spine nontender, no crepitus, or stepoffs.     Neurological: She is alert.  Skin: She is not diaphoretic.  Nursing note and vitals reviewed.    ED Treatments / Results  Labs (all labs ordered are listed, but only abnormal results are displayed) Labs Reviewed  URINALYSIS, ROUTINE W REFLEX MICROSCOPIC - Abnormal; Notable for the following:       Result Value   APPearance CLOUDY (*)    Specific Gravity, Urine 1.036 (*)    Glucose, UA >=500 (*)    Hgb urine dipstick LARGE (*)    Ketones, ur 15 (*)    Protein, ur 30 (*)    All other components within normal limits  URINALYSIS, MICROSCOPIC (REFLEX) - Abnormal; Notable for the following:    Bacteria, UA MANY (*)    Squamous Epithelial / LPF 6-30 (*)    All other components within normal limits  COMPREHENSIVE METABOLIC PANEL - Abnormal; Notable for the following:    Chloride 99 (*)    Glucose, Bld 269 (*)    Alkaline Phosphatase 139 (*)    All other components within normal limits  URINE CULTURE  CBC WITH DIFFERENTIAL/PLATELET  LIPASE, BLOOD    EKG  EKG Interpretation None       Radiology Dg Chest 2 View  Result Date: 06/17/2016 CLINICAL DATA:  Thoracic back pain. EXAM: CHEST  2 VIEW COMPARISON:  05/11/2016 FINDINGS: Normal heart size and mediastinal contours. No acute infiltrate or edema. No effusion or pneumothorax. Cervical ribs, greater on the left.No acute osseous findings. Cholecystectomy. IMPRESSION: No acute finding. Electronically Signed   By: Monte Fantasia M.D.   On: 06/17/2016 10:37   US Renal  Result Date: 06/17/2016 CLINICAL DATA:  Flank pain EXAM: RENAL / URINARY TRACT ULTRASOUND COMPLETE COMPARISON:  CT abdomen and pelvis February 19, 2015 FINDINGS: Right Kidney: Length: 12.6 cm. 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 Echogenicity and renal cortical thickness are within normal limits. No mass, perinephric fluid, or hydronephrosis visualized. No sonographically demonstrable calculus or ureterectasis. Left Kidney: Length: 11.6 cm.  Echogenicity and renal cortical thickness are within normal  limits. No mass, perinephric fluid, or hydronephrosis visualized. No sonographically demonstrable calculus or ureterectasis. Bladder: Appears normal for degree of bladder distention. 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 No obstructing focus in either kidney. Study otherwise unremarkable. Electronically Signed   By: Lowella Grip III M.D.   On: 06/17/2016 11:29    Procedures Procedures (including critical care time)  Medications Ordered in ED Medications  sodium chloride 0.9 % bolus 1,000 mL (0 mLs Intravenous Stopped 06/17/16 1211)  morphine 4 MG/ML injection 4 mg (4 mg Intravenous Given 06/17/16 1051)  promethazine (PHENERGAN) injection 25 mg (25 mg Intravenous Given 06/17/16 1048)     Initial Impression / Assessment and Plan / ED Course  I have reviewed the triage vital signs and the nursing notes.  Pertinent labs & imaging results that were available during my care of the patient were reviewed by me and considered in my medical decision making (see chart for details).  Clinical Course as of Jun 18 1355  Wed Jun 17, 2016  1212 Pt reports symptoms improved.  She denies vaginal bleeding.  ?Hematuria due to eliquis.  No proven ureteral stone.   [EW]    Clinical Course User Index [EW] [place], Cythnia [place], [place]   Afebrile, nontoxic patient with acute on chronic thoracic back pain.  No current associated symptoms.  Exam unremarkable.  Labs remarkable only for hyperglycemia.  UA with many squamous cells, many bacteria, hematuria but without WBC, nitrites, or leukocytes.  Renal US unremarkable.  CXR negative.  CT abd/pelvis and ED visit from [facility] reviewed via [place].  IVF, pain and nausea medication given with improvement.    D/C home with phenergan, close PCP follow up.   Discussed result, findings, treatment, and follow up  with patient.  Pt given return precautions.  Pt  verbalizes understanding and agrees with plan.       Final Clinical Impressions(s) / ED Diagnoses   Final diagnoses:  Acute bilateral thoracic back pain  Nausea    New Prescriptions Discharge Medication List as of 06/17/2016 12:12 PM    START taking these medications   Details  promethazine (PHENERGAN) 25 MG tablet Take 1 tablet (25 mg total) by mouth every 6 (six) hours as needed for nausea., Starting Wed 06/17/2016, Print         [employer], [place], PA-C 06/17/16 1401    Veryl Speak, MD 06/17/16 1415

## 2016-06-18 LAB — URINE CULTURE

## 2016-06-24 ENCOUNTER — Ambulatory Visit (INDEPENDENT_AMBULATORY_CARE_PROVIDER_SITE_OTHER): Payer: Medicare PPO | Admitting: Orthopedic Surgery

## 2016-06-26 ENCOUNTER — Encounter: Payer: Self-pay | Admitting: *Deleted

## 2016-06-29 ENCOUNTER — Other Ambulatory Visit: Payer: Self-pay | Admitting: *Deleted

## 2016-06-29 DIAGNOSIS — I82412 Acute embolism and thrombosis of left femoral vein: Secondary | ICD-10-CM

## 2016-06-30 ENCOUNTER — Ambulatory Visit (HOSPITAL_BASED_OUTPATIENT_CLINIC_OR_DEPARTMENT_OTHER): Payer: Medicare PPO | Admitting: Family

## 2016-06-30 ENCOUNTER — Other Ambulatory Visit (HOSPITAL_BASED_OUTPATIENT_CLINIC_OR_DEPARTMENT_OTHER): Payer: Medicare PPO

## 2016-06-30 VITALS — BP 116/56 | HR 83 | Temp 98.2°F | Resp 16 | Wt 175.0 lb

## 2016-06-30 DIAGNOSIS — Z7901 Long term (current) use of anticoagulants: Secondary | ICD-10-CM

## 2016-06-30 DIAGNOSIS — I82412 Acute embolism and thrombosis of left femoral vein: Secondary | ICD-10-CM

## 2016-06-30 DIAGNOSIS — Z86718 Personal history of other venous thrombosis and embolism: Secondary | ICD-10-CM

## 2016-06-30 DIAGNOSIS — Z86711 Personal history of pulmonary embolism: Secondary | ICD-10-CM | POA: Diagnosis not present

## 2016-06-30 LAB — CBC WITH DIFFERENTIAL (CANCER CENTER ONLY)
BASO#: 0.1 10*3/uL (ref 0.0–0.2)
BASO%: 0.6 % (ref 0.0–2.0)
EOS%: 1.7 % (ref 0.0–7.0)
Eosinophils Absolute: 0.2 10*3/uL (ref 0.0–0.5)
HCT: 41.2 % (ref 34.8–46.6)
HGB: 14.3 g/dL (ref 11.6–15.9)
LYMPH#: 2.2 10*3/uL (ref 0.9–3.3)
LYMPH%: 18.7 % (ref 14.0–48.0)
MCH: 32.2 pg (ref 26.0–34.0)
MCHC: 34.7 g/dL (ref 32.0–36.0)
MCV: 93 fL (ref 81–101)
MONO#: 0.6 10*3/uL (ref 0.1–0.9)
MONO%: 5 % (ref 0.0–13.0)
NEUT#: 8.7 10*3/uL — ABNORMAL HIGH (ref 1.5–6.5)
NEUT%: 74 % (ref 39.6–80.0)
Platelets: 310 10*3/uL (ref 145–400)
RBC: 4.44 10*6/uL (ref 3.70–5.32)
RDW: 12.6 % (ref 11.1–15.7)
WBC: 11.7 10*3/uL — ABNORMAL HIGH (ref 3.9–10.0)

## 2016-06-30 LAB — COMPREHENSIVE METABOLIC PANEL
ALT: 32 U/L (ref 0–55)
AST: 26 U/L (ref 5–34)
Albumin: 3.6 g/dL (ref 3.5–5.0)
Alkaline Phosphatase: 158 U/L — ABNORMAL HIGH (ref 40–150)
Anion Gap: 13 mEq/L — ABNORMAL HIGH (ref 3–11)
BUN: 8.3 mg/dL (ref 7.0–26.0)
CO2: 27 mEq/L (ref 22–29)
Calcium: 9.8 mg/dL (ref 8.4–10.4)
Chloride: 101 mEq/L (ref 98–109)
Creatinine: 1.1 mg/dL (ref 0.6–1.1)
EGFR: 72 mL/min/{1.73_m2} — ABNORMAL LOW (ref >90)
Glucose: 252 mg/dl — ABNORMAL HIGH (ref 70–140)
Potassium: 3.9 mEq/L (ref 3.5–5.1)
Sodium: 142 mEq/L (ref 136–145)
Total Bilirubin: 0.47 mg/dL (ref 0.20–1.20)
Total Protein: 7.7 g/dL (ref 6.4–8.3)

## 2016-06-30 NOTE — Progress Notes (Signed)
Hematology and Oncology Follow Up Visit  Maureen [name] [id number] 05-09-1970 46 y.o. 06/30/2016   Principle Diagnosis:  1. Acute DVT of femoral vein in left lower extremity - resolved on 04/13/2016 [place]  2. History of PE 3. History of TIA  Current Therapy:   Eliquis 5 mg PO BID - ran out several weeks ago and has not refilled due to cost   Interim History:  Maureen [name] is here today for follow-up. She states that she ran out of Eliquis samples and was unable to refill due to the cost. She is meeting with [employer] Flattery after this visit to discuss possible funding. If she can not afford we will have to get her on to Coumadin.  She has had a good bit of anxiety not being on an anticoagulant. She has had occasional dizziness, fatigue and headaches.  She has uncontrolled diabetes and is on daily insulin. Her Hgb A1c last month was 10.3.  No fever, chills, n/v, cough, rash, dizziness, SOB, chest pain, palpitations, abdominal pain or changes in bowel or bladder habits.  No swelling or tenderness in er extremities at this time. The neuropathy in her feet is better in the summer.  She has maintained a good appetite and is staying well hydrated. Her weight is stable.   ECOG Performance Status: 1 - Symptomatic but completely ambulatory  Medications:  Allergies as of 06/30/2016      Reactions   Cephalexin Anaphylaxis, Shortness Of Breath, Rash, Other (See Comments)   Pt was admitted to the hospital upon taking.   Ibuprofen Nausea And Vomiting, Rash   Meloxicam Nausea Only      Medication List       Accurate as of 06/30/16  8:21 PM. Always use your most recent med list.          acetaminophen 500 MG tablet Commonly known as:  TYLENOL Take 1 tablet (500 mg total) by mouth every 8 (eight) hours as needed for headache.   apixaban 5 MG Tabs tablet Commonly known as:  ELIQUIS Take 1 tablet (5 mg total) by mouth 2 (two) times daily.   atorvastatin 40 MG tablet Commonly known as:  LIPITOR Take  40 mg by mouth at bedtime.   baclofen 10 MG tablet Commonly known as:  LIORESAL TK 1 T PO TID   citalopram 20 MG tablet Commonly known as:  CELEXA Take 1 tablet (20 mg total) by mouth daily.   gabapentin 300 MG capsule Commonly known as:  NEURONTIN Take 300 mg by mouth daily as needed (for pain).   insulin aspart 100 UNIT/ML injection Commonly known as:  novoLOG Inject 8 Units into the skin 3 (three) times daily with meals.   insulin aspart protamine - aspart (70-30) 100 UNIT/ML FlexPen Commonly known as:  NOVOLOG 70/30 MIX Inject into the skin.   insulin glargine 100 UNIT/ML injection Commonly known as:  LANTUS Inject 0.28 mLs (28 Units total) into the skin at bedtime.   lidocaine 5 % Commonly known as:  LIDODERM Place 1 patch onto the skin daily as needed (pain). Remove after 12 hours.   ondansetron 4 MG disintegrating tablet Commonly known as:  ZOFRAN-ODT TK 1 T PO Q 8 H PRN NAUSEA FOR UP TO 7 DAYS   promethazine 25 MG tablet Commonly known as:  PHENERGAN Take 1 tablet (25 mg total) by mouth every 6 (six) hours as needed for nausea.   Suvorexant 10 MG Tabs Commonly known as:  BELSOMRA Take 1 tablet by mouth  daily.   tiZANidine 4 MG tablet Commonly known as:  ZANAFLEX TAKE ONE TABLET BY MOUTH EVERY 6 HOURS AS NEEDED FOR MUSCLE SPASM   traMADol 50 MG tablet Commonly known as:  ULTRAM TK 1 T PO Q 8 H PRN P       Allergies:  Allergies  Allergen Reactions  . Cephalexin Anaphylaxis, Shortness Of Breath, Rash and Other (See Comments)    Pt was admitted to the hospital upon taking.   . Ibuprofen Nausea And Vomiting and Rash  . Meloxicam Nausea Only    Past Medical History, Surgical history, Social history, and Family History were reviewed and updated.  Review of Systems: All other 10 point review of systems is negative.   Physical Exam:  weight is 175 lb (79.4 kg). Her oral temperature is 98.2 F (36.8 C). Her blood pressure is 116/56 (abnormal) and  her pulse is 83. Her respiration is 16 and oxygen saturation is 100%.   Wt Readings from Last 3 Encounters:  06/30/16 175 lb (79.4 kg)  06/17/16 175 lb (79.4 kg)  05/19/16 175 lb (79.4 kg)    Ocular: Sclerae unicteric, pupils equal, round and reactive to light Ear-nose-throat: Oropharynx clear, dentition fair Lymphatic: No cervical, supraclavicular or axillary adenopathy Lungs no rales or rhonchi, good excursion bilaterally Heart regular rate and rhythm, no murmur appreciated Abd soft, nontender, positive bowel sounds, no liver or spleen tip palpated on exam, no fluid wave MSK no focal spinal tenderness, no joint edema Neuro: non-focal, well-oriented, appropriate affect Breasts: Deferred   Lab Results  Component Value Date   WBC 11.7 (H) 06/30/2016   HGB 14.3 06/30/2016   HCT 41.2 06/30/2016   MCV 93 06/30/2016   PLT 310 06/30/2016   Lab Results  Component Value Date   IRON 25 (L) 02/14/2013   TIBC 264 02/14/2013   UIBC 239 02/14/2013   IRONPCTSAT 9 (L) 02/14/2013   Lab Results  Component Value Date   RBC 4.44 06/30/2016   No results found for: KPAFRELGTCHN, LAMBDASER, KAPLAMBRATIO No results found for: IGGSERUM, IGA, IGMSERUM No results found for: TOTALPROTELP, ALBUMINELP, A1GS, A2GS, Violet Baldy, MSPIKE, SPEI   Chemistry      Component Value Date/Time   NA 142 06/30/2016 1100   K 3.9 06/30/2016 1100   CL 99 (L) 06/17/2016 1025   CL 97 (L) 05/19/2016 1113   CO2 27 06/30/2016 1100   BUN 8.3 06/30/2016 1100   CREATININE 1.1 06/30/2016 1100      Component Value Date/Time   CALCIUM 9.8 06/30/2016 1100   ALKPHOS 158 (H) 06/30/2016 1100   AST 26 06/30/2016 1100   ALT 32 06/30/2016 1100   BILITOT 0.47 06/30/2016 1100      Impression and Plan: Maureen [name] is a very pleasant 46 yo caucasian female with history of PE in 2016 treated with Xarelto. She then had a DVT earlier this year treated again with Xarelto. Due to cost she stopped the Xarelto and  started Eliquis. She stopped the Eliquis once she ran out of samples a couple weeks ago due to cost. She was unanble to get any assistance so we will get her on to Coumadin 5 mg PO daily and check an INR next week and weekly.  She knows to avoid green leafy vegetables. She verbalized understanding and she is in agreement with this plan.  We will go ahead plan to see her back in another 6 weeks for follow-up.  She will contact our office with any  questions or concerns. We can certainly see her sooner if need be.   Eliezer Bottom, NP 6/12/20188:21 PM

## 2016-07-02 ENCOUNTER — Encounter (INDEPENDENT_AMBULATORY_CARE_PROVIDER_SITE_OTHER): Payer: Self-pay | Admitting: Orthopedic Surgery

## 2016-07-02 ENCOUNTER — Ambulatory Visit (INDEPENDENT_AMBULATORY_CARE_PROVIDER_SITE_OTHER): Payer: Medicare PPO | Admitting: Orthopedic Surgery

## 2016-07-02 VITALS — Ht 64.0 in | Wt 175.0 lb

## 2016-07-02 DIAGNOSIS — S93325S Dislocation of tarsometatarsal joint of left foot, sequela: Secondary | ICD-10-CM | POA: Diagnosis not present

## 2016-07-02 DIAGNOSIS — E1065 Type 1 diabetes mellitus with hyperglycemia: Secondary | ICD-10-CM

## 2016-07-02 DIAGNOSIS — M79662 Pain in left lower leg: Secondary | ICD-10-CM | POA: Diagnosis not present

## 2016-07-02 NOTE — Progress Notes (Signed)
Office Visit Note   Patient: Maureen [name]           Date of Birth: 08/06/1970           MRN: [id number] Visit Date: 07/02/2016              Requested by: [address], [place],  [id number] PCP: Patient, No Pcp Per  Chief Complaint  Patient presents with  . Left Foot - Pain      HPI: Patient presents follow-up for her left lower extremity. Patient states she occasionally has some pain in her foot but is been having pain with prolonged activities in the posterior aspect of her left calf.  Patient states that she has had multiple blood clots recently she is currently on Eloquist and she states she may be transitioning to Coumadin due to the cost.  Assessment & Plan: Visit Diagnoses:  1. Pain of left calf   2. Lisfranc dislocation, left, sequela   3. Type 1 diabetes mellitus with hyperglycemia (HCC)     Plan: Patient was given instructions for heel cord stretching to do daily. Recommended shape but her for scar massage for her incisions.  Follow-Up Instructions: Return if symptoms worsen or fail to improve.   Ortho Exam  Patient is alert, oriented, no adenopathy, well-dressed, normal affect, normal respiratory effort. Examination patient has a normal gait. Examination the calf is not tender to palpation no signs or symptoms of a DVT. Her surgical incisions are well healed. Patient has dorsiflexion about 20 past neutral with her knee extended she still has good dorsiflexion with the gastrocnemius recession. The midfoot is nontender to palpation status post internal fixation for her Lisfranc fracture dislocation. There is no redness no cellulitis no signs of infection.  Imaging: No results found.  Labs: Lab Results  Component Value Date   HGBA1C 9.3 (H) 03/05/2016   HGBA1C 10.4 (H) 11/19/2015   HGBA1C 9.3 02/23/2015   ESRSEDRATE 24 (H) 02/14/2013   ESRSEDRATE 28 (H) 12/18/2012   ESRSEDRATE 48 (H) 05/16/2008   REPTSTATUS 06/18/2016 FINAL  06/17/2016   CULT MULTIPLE SPECIES PRESENT, SUGGEST RECOLLECTION (A) 06/17/2016   LABORGA STAPHYLOCOCCUS SPECIES (COAGULASE NEGATIVE) 07/18/2013    Orders:  No orders of the defined types were placed in this encounter.  No orders of the defined types were placed in this encounter.    Procedures: No procedures performed  Clinical Data: No additional findings.  ROS:  All other systems negative, except as noted in the HPI. Review of Systems  Objective: Vital Signs: Ht 5\' 4"  (1.626 m)   Wt 175 lb (79.4 kg)   BMI 30.04 kg/m   Specialty Comments:  No specialty comments available.  PMFS History: Patient Active Problem List   Diagnosis Date Noted  . OSA on CPAP 05/14/2016  . Obesity (BMI 30-39.9) 05/14/2016  . Acute deep vein thrombosis (DVT) of femoral vein of left lower extremity ([place]) 02/10/2016  . Diabetic polyneuropathy associated with type 2 diabetes mellitus ([place]) 02/10/2016  . Lisfranc dislocation, left, sequela 12/16/2015  . Short Achilles tendon (acquired), left ankle 12/16/2015  . Chest pain with moderate risk for cardiac etiology 11/29/2015  . Elevated troponin 11/19/2015  . Migraine headache 11/19/2015  . Neck pain 11/19/2015  . Acute kidney injury ([place]) 02/19/2015  . DKA (diabetic ketoacidoses) ([place]) 10/24/2014  . HLD (hyperlipidemia) 10/24/2014  . Abnormal LFTs 09/03/2014  . Dizziness 09/03/2014  . Acute pulmonary embolism ([place]) 09/03/2014  . Transaminitis 09/02/2014  .  GAD (generalized anxiety disorder) 11/23/2013  . PTSD (post-traumatic stress disorder) 11/23/2013  . Severe major depression without psychotic features ([place]) 10/10/2013  . Endometriosis 12/08/2011  . Arthritis of left knee 12/08/2011  . Diabetic retinopathy ([place]) 12/08/2011  . Fibromyalgia 10/23/2011  . DM type 1 (diabetes mellitus, type 1) ([place]) 10/13/2011   Past Medical History:  Diagnosis Date  . Abdominal pain   . Achilles tendon contracture, left    with lisfrabc fracture  .  Anxiety   . Arthritis   . Biliary dyskinesia   . Cervical strain   . Closed head injury 2010  . Depression   . Elevated LFTs   . Fibromyalgia   . GERD (gastroesophageal reflux disease)   . History of kidney stones   . Hyperlipidemia   . Hypoglycemia   . Joint pain   . Major depressive disorder   . Migraine    hx of none recent  . Nausea & vomiting   . OSA on CPAP 05/14/2016   Moderate to severe OSA with an AHI of 28/hr now on CPAP at 19cm H2O  . Pneumonia   . Post traumatic stress disorder (PTSD)   . Pulmonary embolism ([place])   . Renal stones   . Skin rash    due to medications  . Sleep trouble   . Stroke [facility])    " series of mini strokes around 2007"  . TIA (transient ischemic attack)   . Wears glasses   . 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 Cancer Father        lymphoma  . Nephrolithiasis Father   . Vascular Disease Father   . Alcohol abuse Father   . Drug abuse Father   . Cancer Maternal Grandmother        colon  . Hypertension Mother   . Heart disease Mother   . Cataracts Mother   . Depression Mother   . Diabetes Brother   . Drug abuse Brother   . Mental illness Maternal Grandfather   . Cancer Maternal Grandfather   . Heart disease Paternal Grandmother   . Heart disease Paternal Grandfather   . Suicidality Maternal Uncle     Past Surgical History:  Procedure Laterality Date  . ABDOMINAL HYSTERECTOMY  2001  . APPENDECTOMY    . [place]  . CHOLECYSTECTOMY  02/10/2011   Procedure: LAPAROSCOPIC CHOLECYSTECTOMY WITH INTRAOPERATIVE CHOLANGIOGRAM;  Surgeon: Judieth Keens, DO;  Location: [facility];  Service: General;  Laterality: N/A;  . COLONOSCOPY    . DILATION AND CURETTAGE OF UTERUS    . exploratory laparomty  Mar 04, 2012  . EYE SURGERY  2006   laser eye surgery left eye  . KNEE SURGERY Left 2012  . LAPAROSCOPIC APPENDECTOMY N/A  10/12/2012   Procedure: DIAGNOSTIC APPENDECTOMY LAPAROSCOPIC;  Surgeon: Madilyn Hook, DO;  Location: [facility];  Service: General;  Laterality: N/A;  . lipoma removed  yrs ago  . LIVER BIOPSY    . ORIF ANKLE FRACTURE Left 01/03/2016   Procedure: OPEN REDUCTION INTERNAL FIXATION (ORIF) LEFT LISFRANC JOINT, GASTROC RECESSION;  Surgeon: Newt Minion, MD;  Location: [facility];  Service: Orthopedics;  Laterality: Left;  . ROBOTIC ASSISTED LAPAROSCOPIC LYSIS OF ADHESION N/A 03/04/2012   Procedure: ROBOTIC ASSISTED LAPAROSCOPIC LYSIS OF EXTENSIVE ADHESIONS;  Surgeon: Claiborne Billings A. Pamala Hurry, MD;  Location: [facility];  Service: Gynecology;  Laterality: N/A;  . 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 Smoking status: Never Smoker  . Smokeless tobacco: Never Used  . Alcohol use Yes     Comment: Socially  . Drug use: No  . Sexual activity: Not on file

## 2016-07-03 MED ORDER — WARFARIN SODIUM 5 MG PO TABS: 5.0000 mg | ORAL_TABLET | Freq: Every day | ORAL | 1 refills | Status: DC

## 2016-07-05 ENCOUNTER — Other Ambulatory Visit: Payer: Self-pay | Admitting: Family

## 2016-07-05 DIAGNOSIS — Z86711 Personal history of pulmonary embolism: Secondary | ICD-10-CM

## 2016-07-05 DIAGNOSIS — I82412 Acute embolism and thrombosis of left femoral vein: Secondary | ICD-10-CM

## 2016-07-05 IMAGING — CT CT HEAD W/O CM
2 series · 16 of 30 positions shown, 20 images · non-contrast
Comparison: CT head without contrast 10/30/2013.

CLINICAL DATA: Dizziness.  Urinary incontinence.  Chest pain.

EXAM:
CT HEAD WITHOUT CONTRAST
TECHNIQUE: Contiguous axial images were obtained from the base of the skull
through the vertex without intravenous contrast.

[Series 201: head w/o, idose (1) · axial · non-contrast · 0.49mm/px · z∈[+79,+199]mm · 13 of 30 slices shown, 17 images]
[im 3/30  brain]
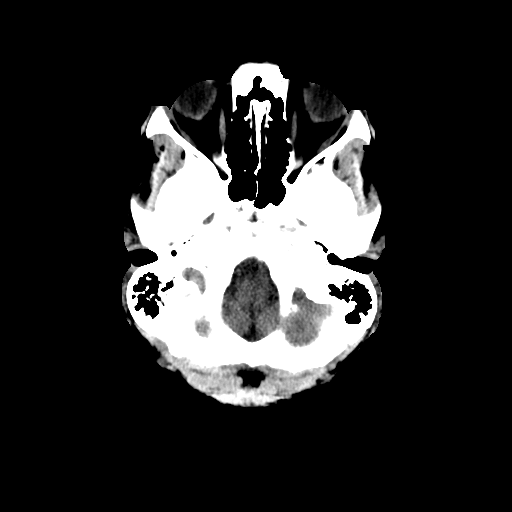
[im 3/30  bone]
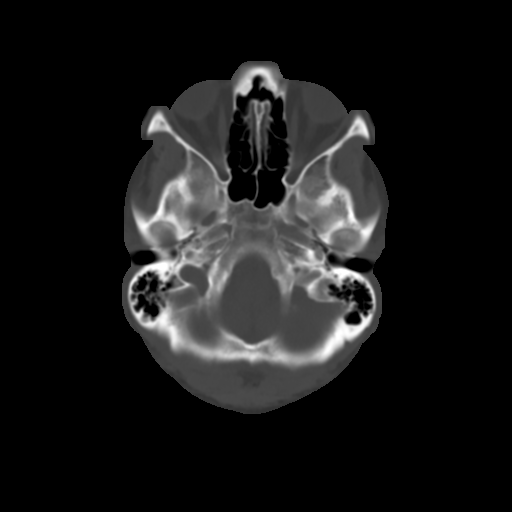
[im 5/30  brain]
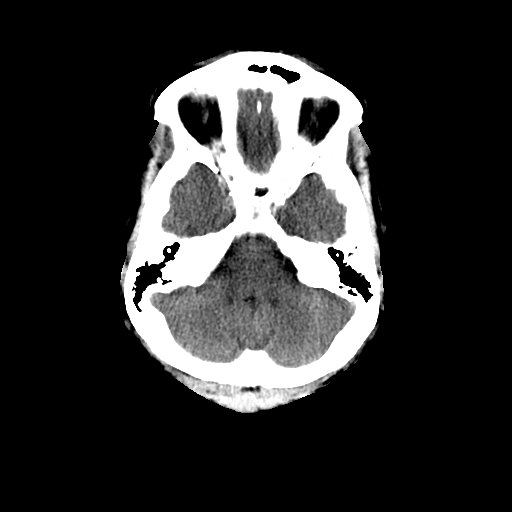
[im 7/30  brain]
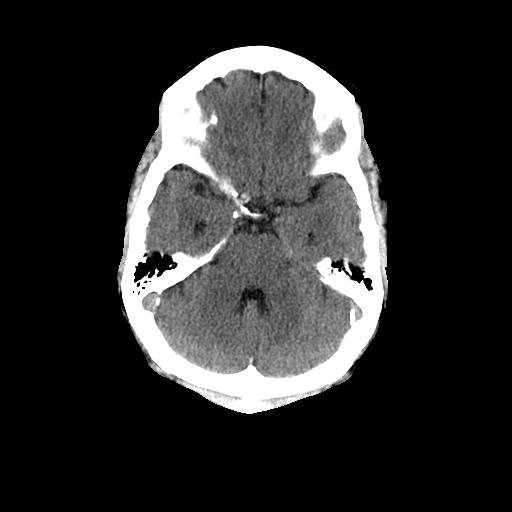
[im 9/30  brain]
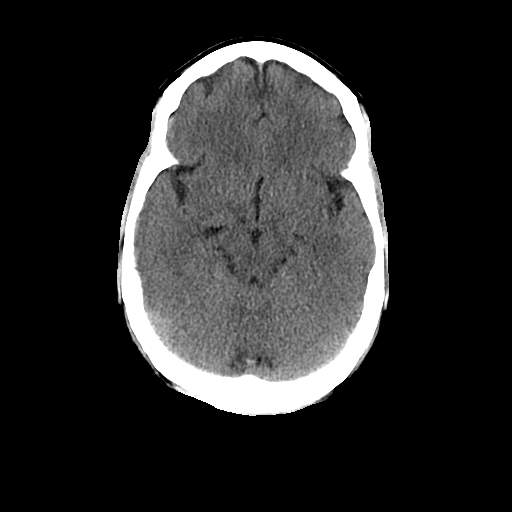
[im 11/30  brain]
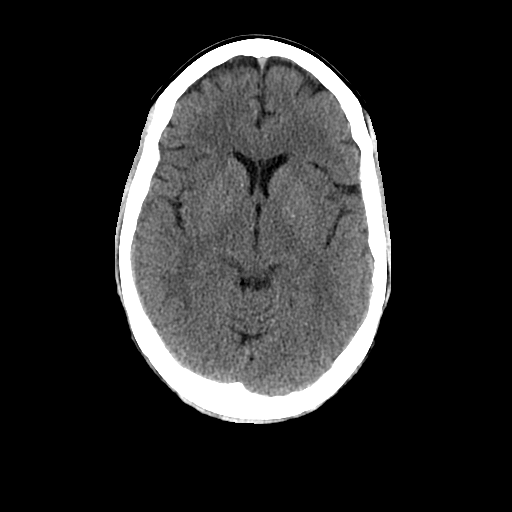
[im 11/30  bone]
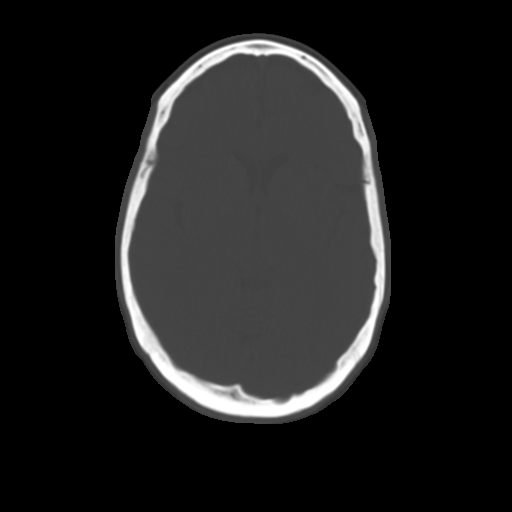
[im 13/30  brain]
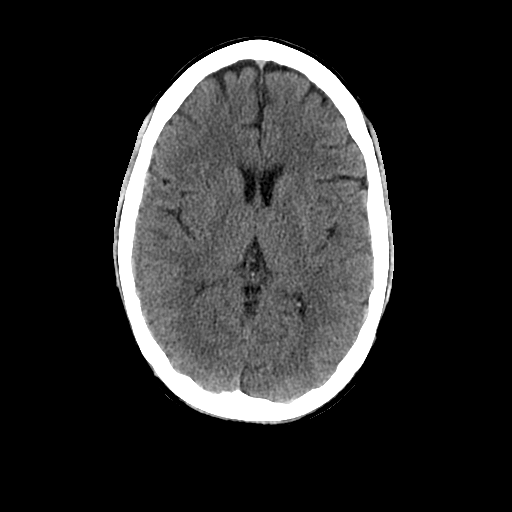
[im 15/30  brain]
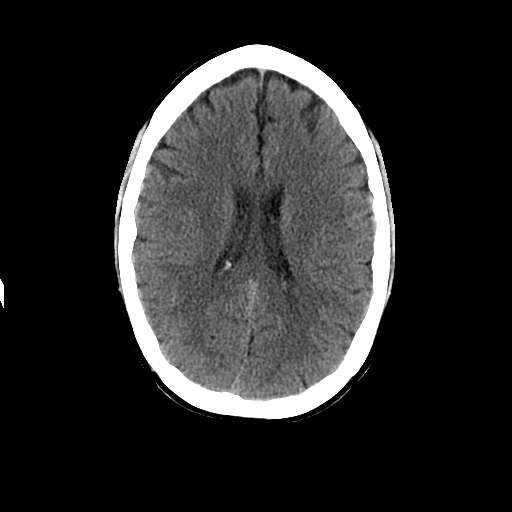
[im 17/30  brain]
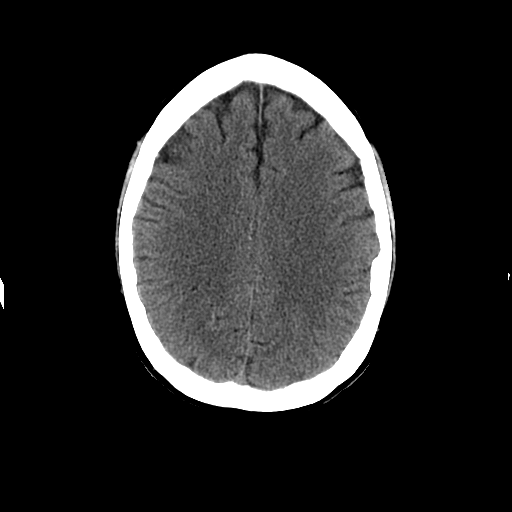
[im 19/30  brain]
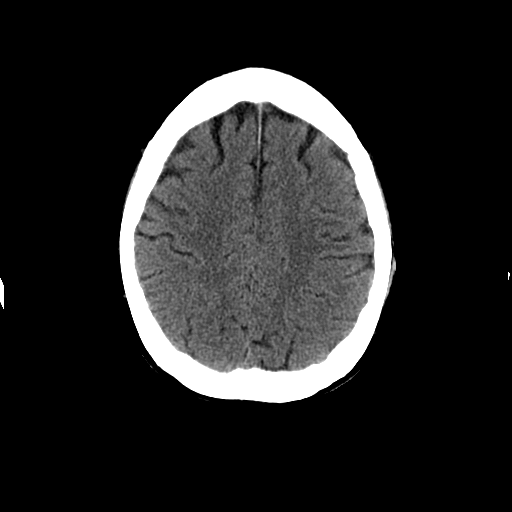
[im 19/30  bone]
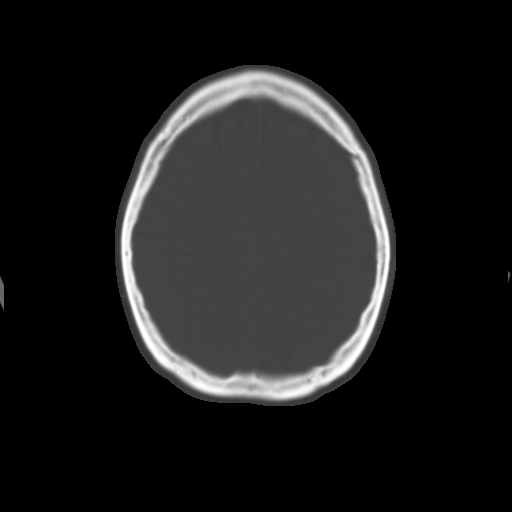
[im 21/30  brain]
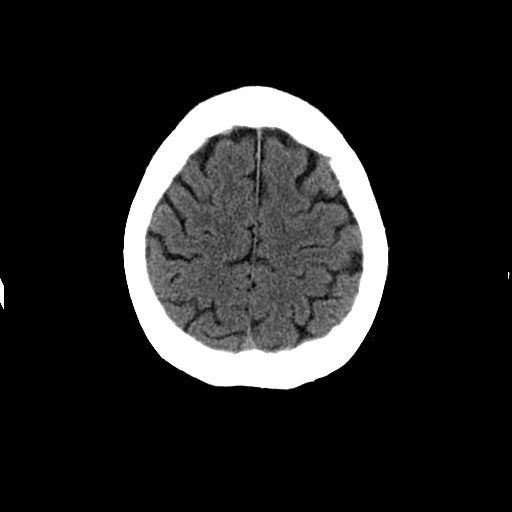
[im 23/30  brain]
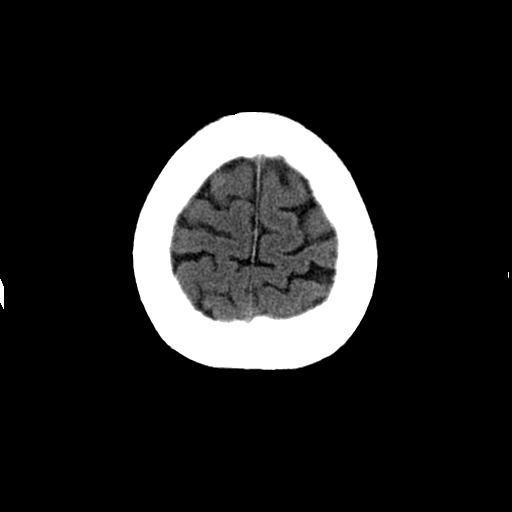
[im 25/30  brain]
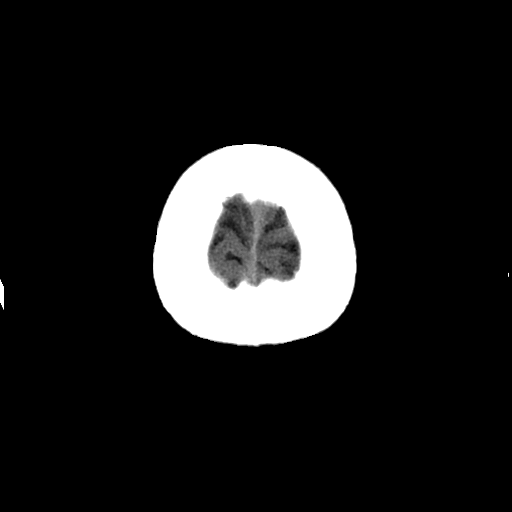
[im 27/30  brain]
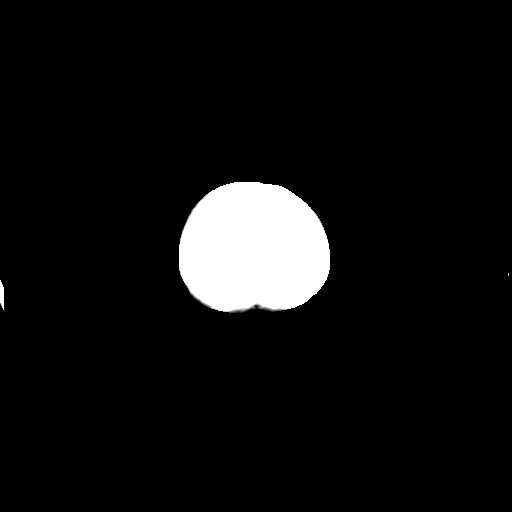
[im 27/30  bone]
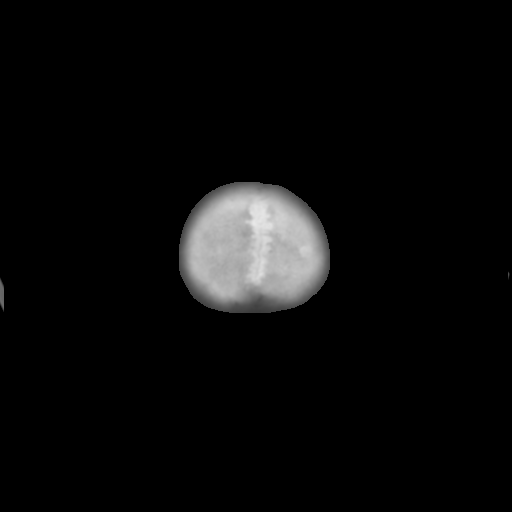

[Series 202: head w/o bone, idose (1) · axial · non-contrast · 0.49mm/px · z∈[+79,+119]mm · 3 of 30 slices shown]
[im 3/30  bone]
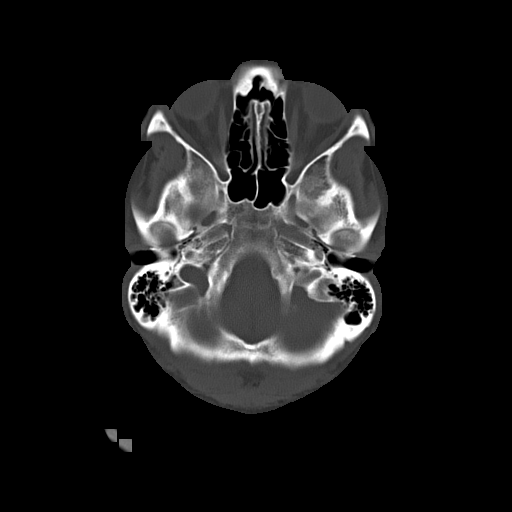
[im 7/30  bone]
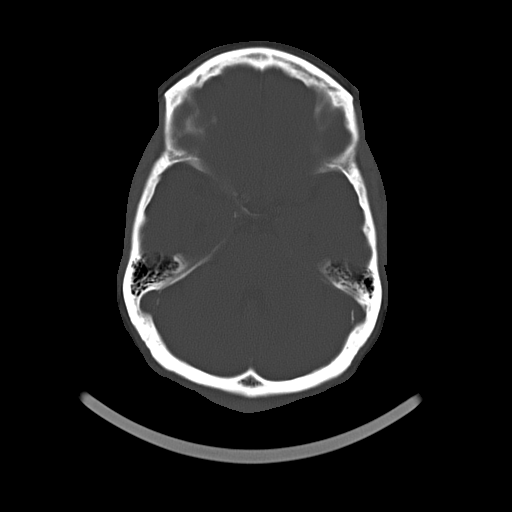
[im 11/30  bone]
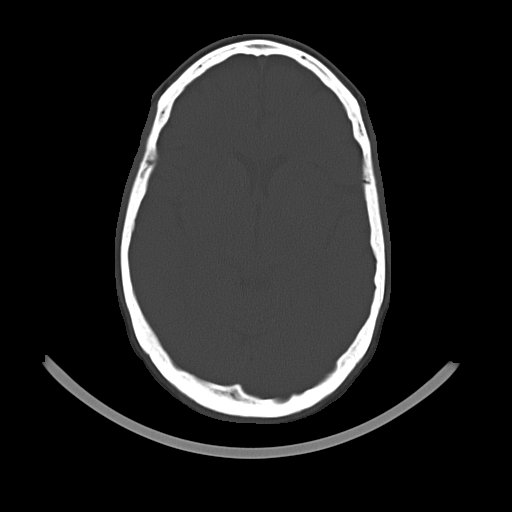

[16 of 30 positions shown; findings below may reference images not displayed]

FINDINGS: No acute cortical infarct, hemorrhage, or mass lesion is present.
The ventricles are of normal size. No significant extra-axial fluid
collection is evident. The paranasal sinuses and mastoid air cells
are clear. The calvarium is intact. No significant extracranial soft
tissue lesion is present. The globes and orbits are intact.
IMPRESSION: Negative CT of the head.

## 2016-07-05 IMAGING — CR DG CHEST 2V
2 series · 2 of 2 positions shown · non-contrast
Comparison: 07/28/2014

CLINICAL DATA: Intermittent chest pain for 2 days

EXAM:
CHEST  2 VIEW

[chest pa]
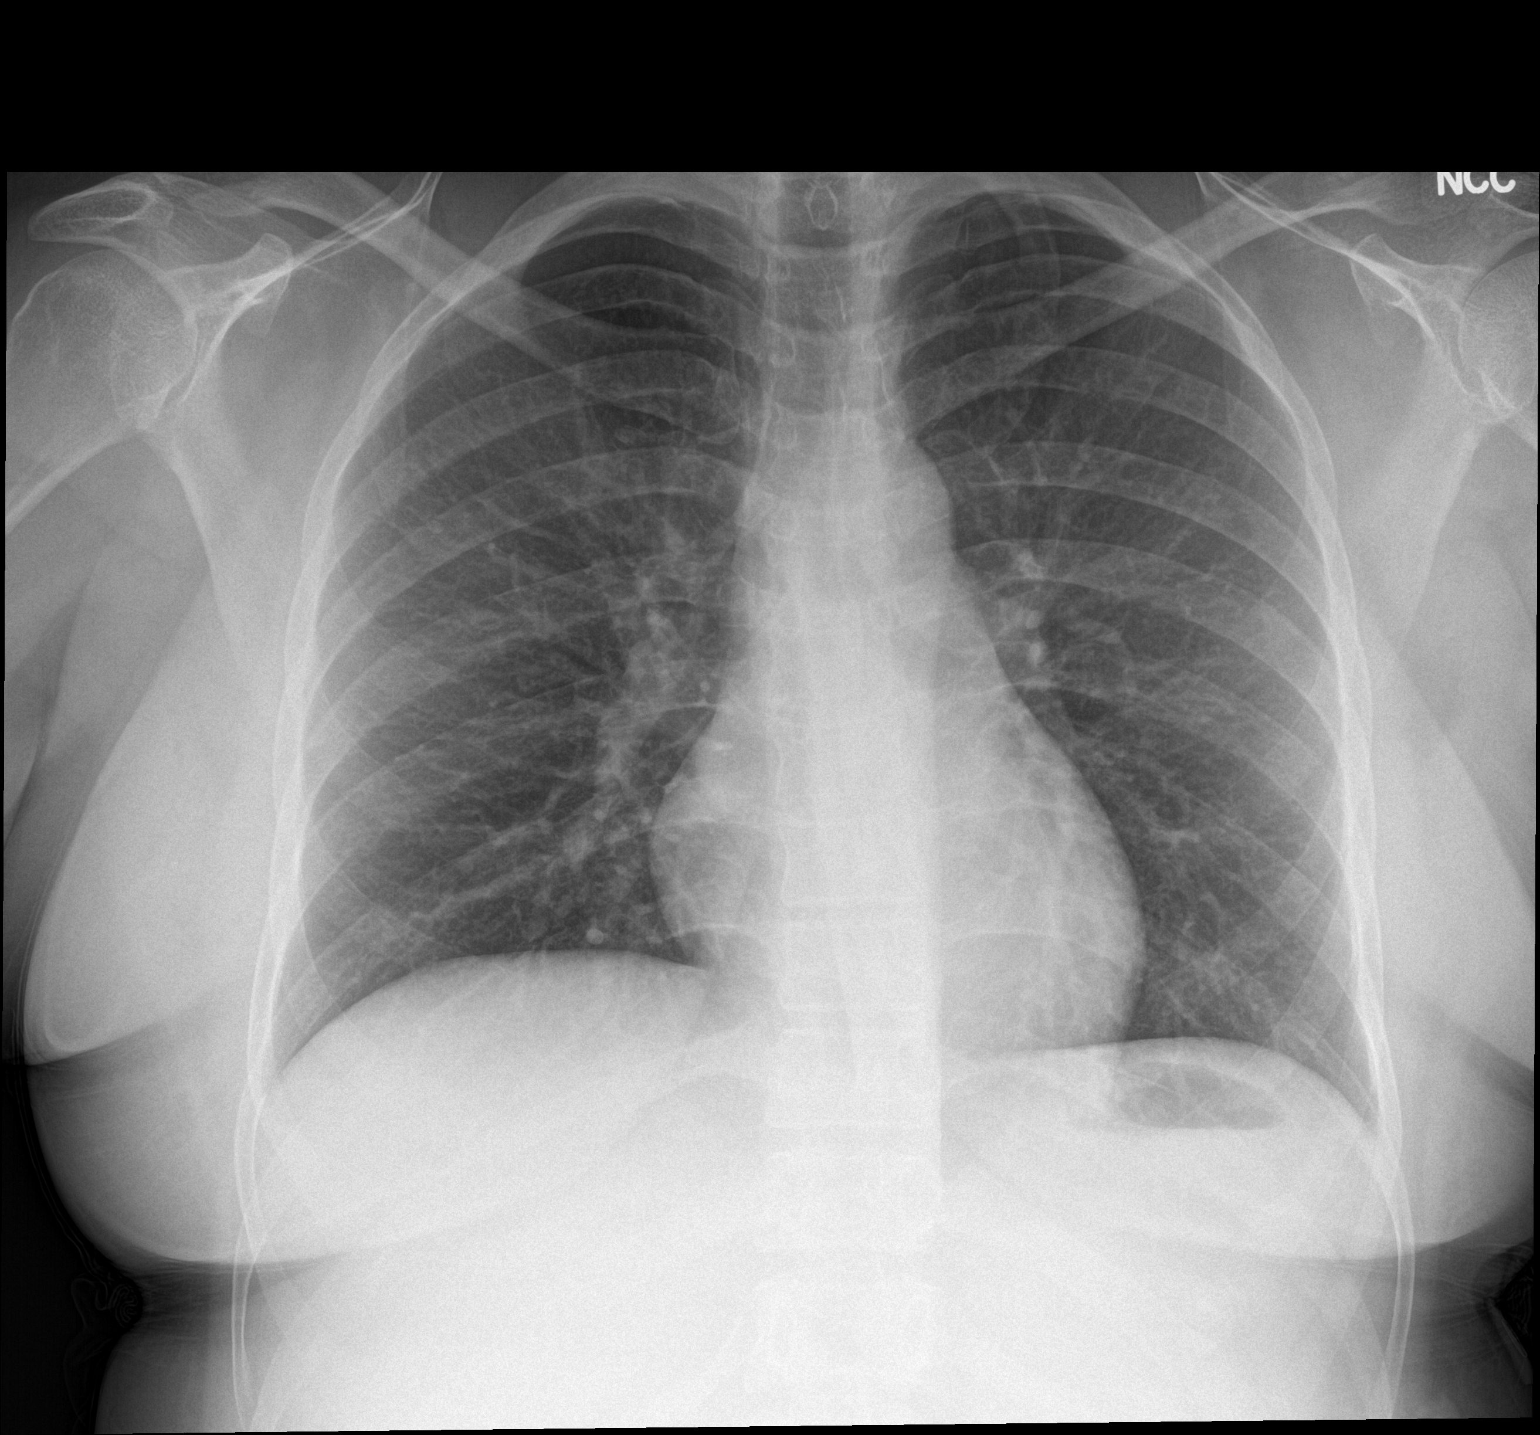

[chest lat]
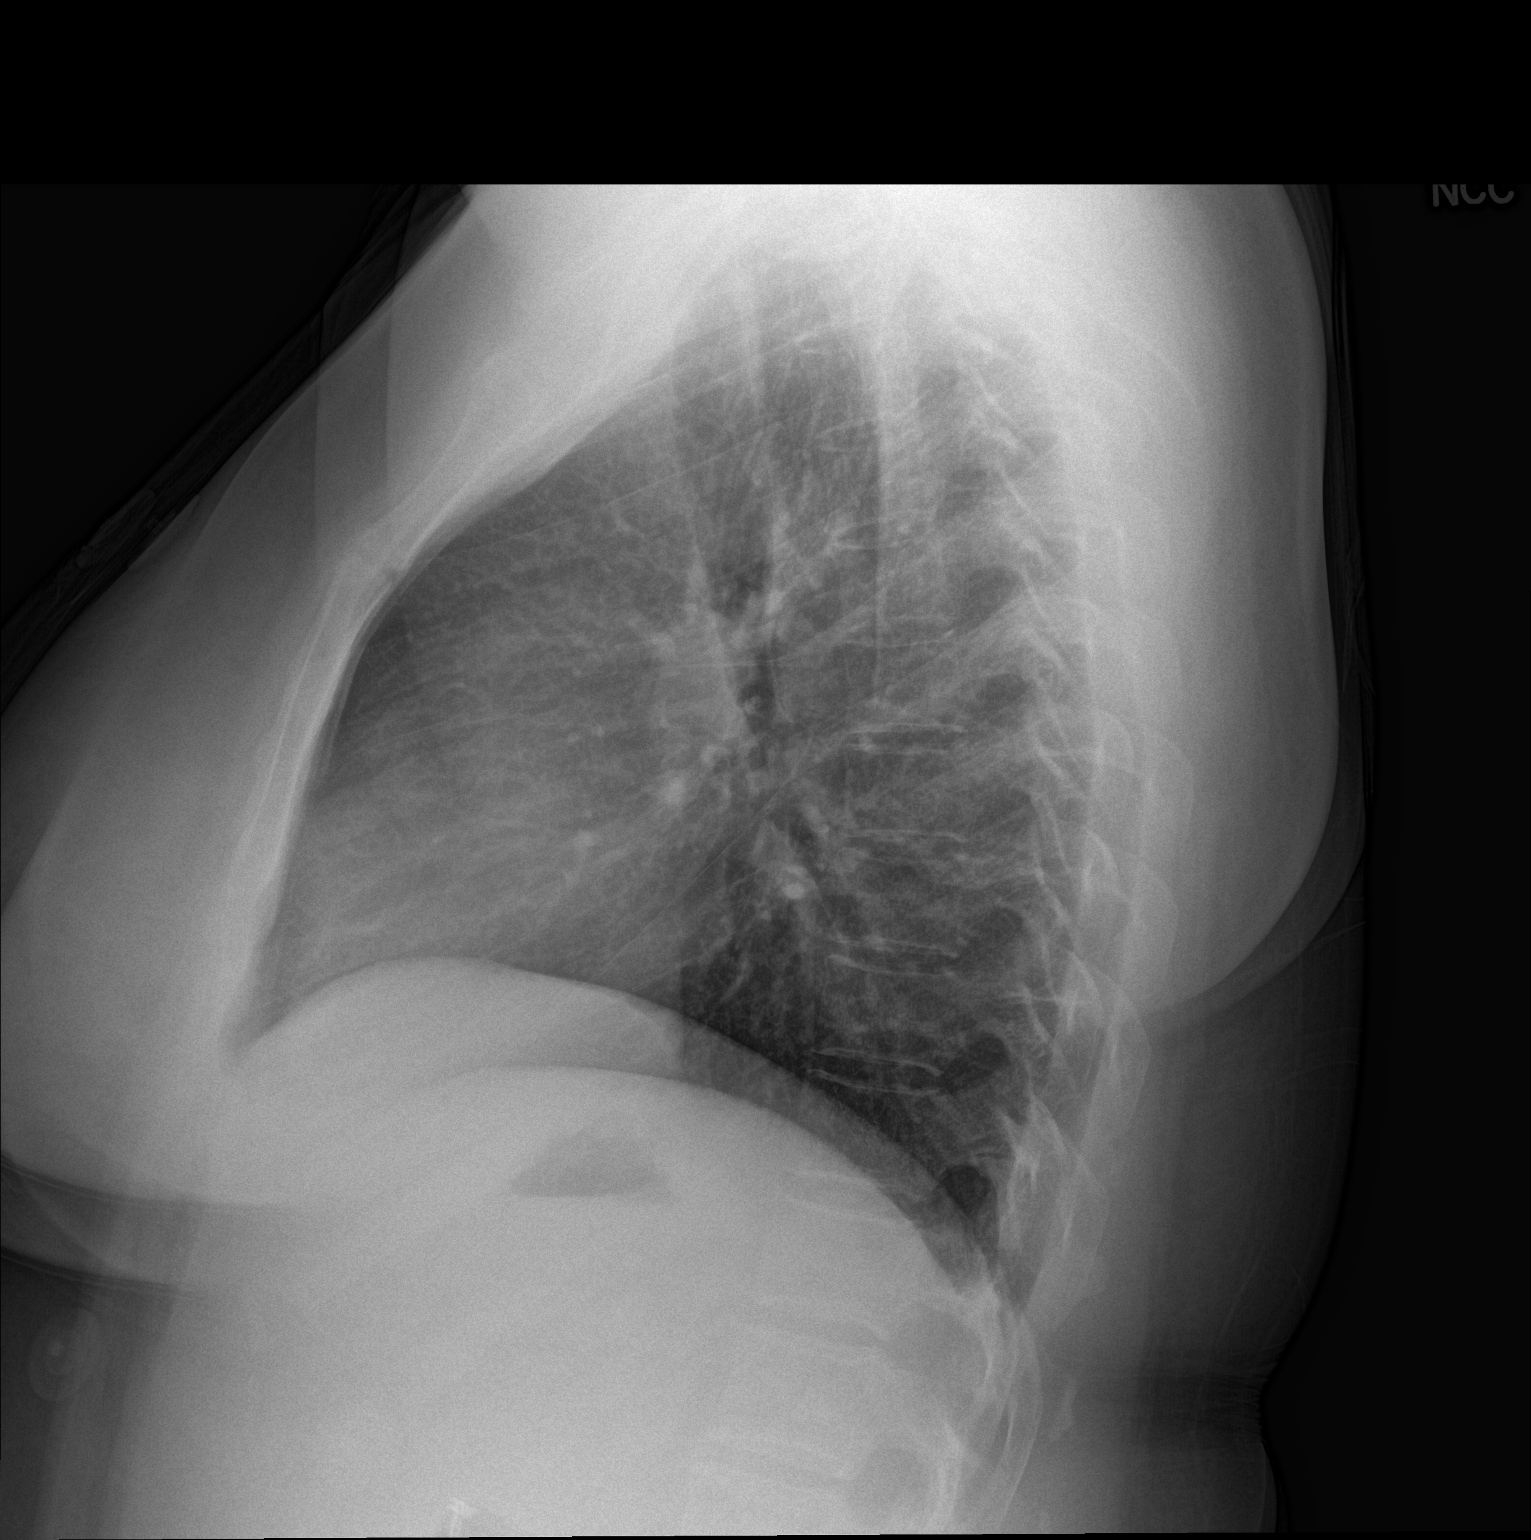

[2 of 2 positions shown; findings below may reference images not displayed]

FINDINGS: The heart size and mediastinal contours are within normal limits.
Both lungs are clear. The visualized skeletal structures are
unremarkable.
IMPRESSION: No active cardiopulmonary disease.

## 2016-07-05 IMAGING — MR MR HEAD W/O CM
9 of 10 series · 35 of 48 positions shown · non-contrast
Comparison: None.

CLINICAL DATA: Dizziness and lightheadedness 1 trying to walk.
Symptoms began when she sits up and are worse with standing.

EXAM:
MRI HEAD WITHOUT CONTRAST
TECHNIQUE: Multiplanar, multiecho pulse sequences of the brain and surrounding
structures were obtained without intravenous contrast.

[Series 3: T1 · sagittal · 5.0mm · 0.47mm/px · 1 of 23 slices shown]
[im 1/23]
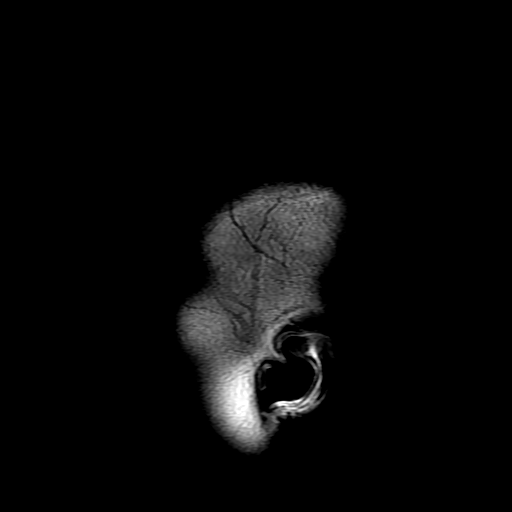

[Series 4: DWI · axial · 3.0mm · 1.09mm/px · z∈[-100,+49]mm · 8 of 102 slices shown (1 of 4)]
[im 1/102]
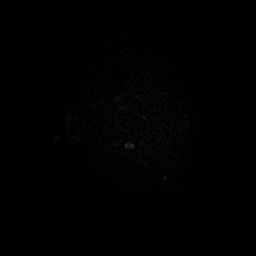
[im 12/102]
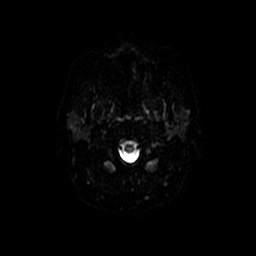
[im 34/102]
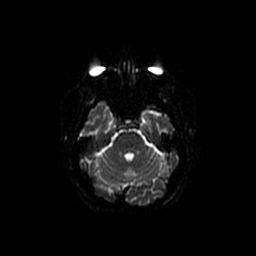
[im 45/102]
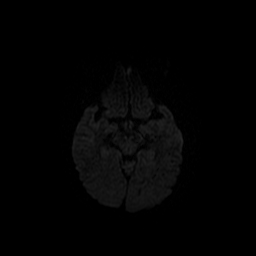
[im 57/102]
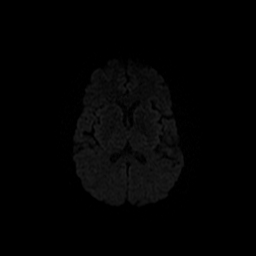
[im 68/102]
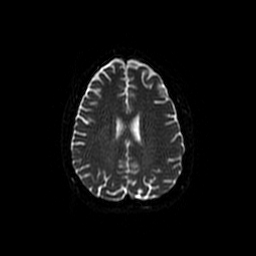
[im 90/102]
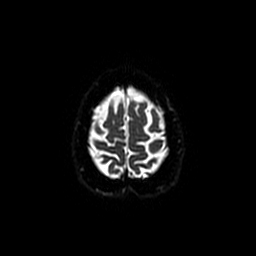
[im 102/102]
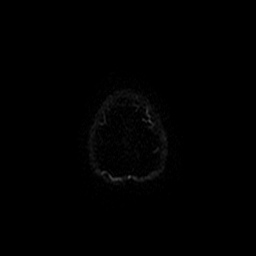

[Series 5: T2 · axial · 5.0mm · 0.43mm/px · z∈[-97,+52]mm · 3 of 26 slices shown (1 of 2)]
[im 1/26]
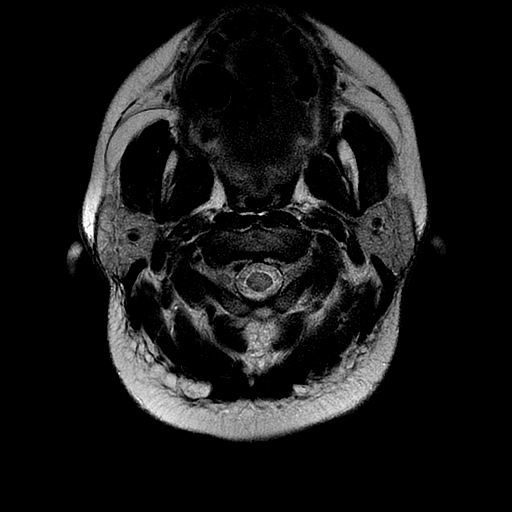
[im 13/26]
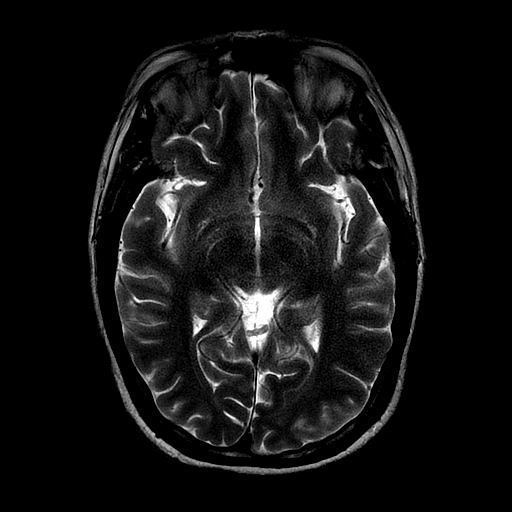
[im 26/26]
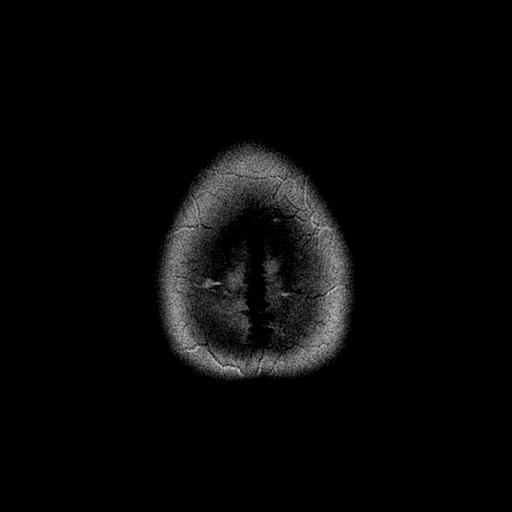

[Series 6: FLAIR · axial · 5.0mm · 0.43mm/px · z∈[-97,+52]mm · 3 of 26 slices shown]
[im 1/26]
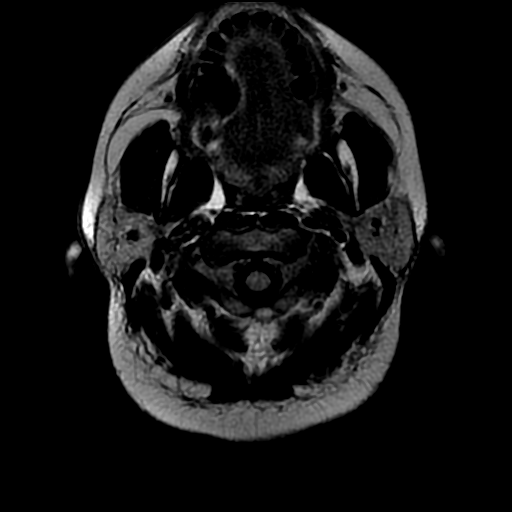
[im 13/26]
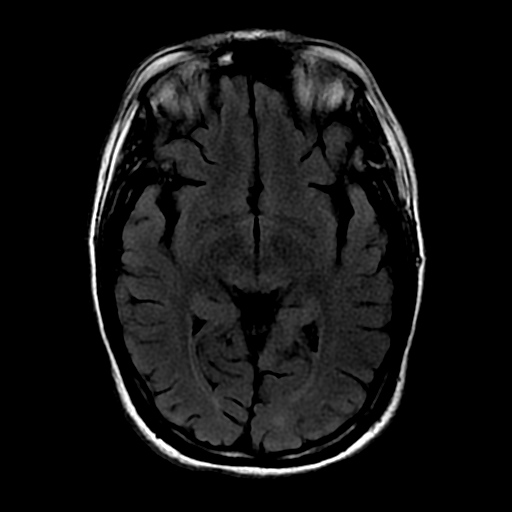
[im 26/26]
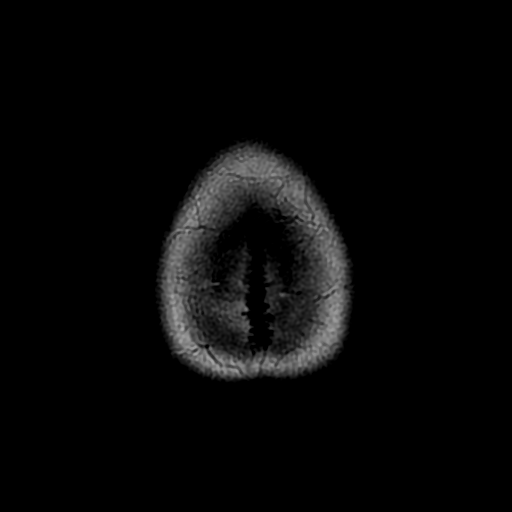

[Series 7: ax mpgr · axial · 5.0mm · 0.43mm/px · z∈[-97,-26]mm · 2 of 26 slices shown]
[im 1/26]
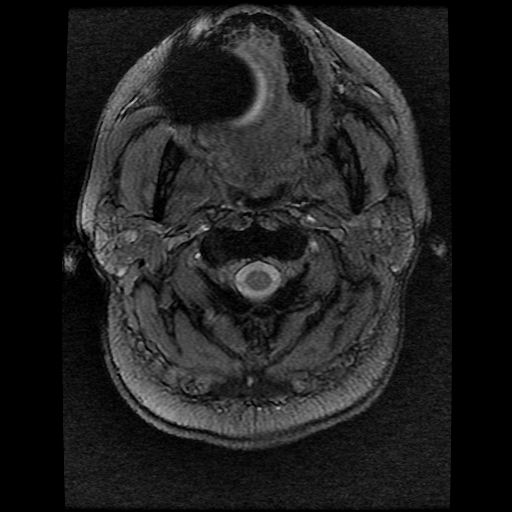
[im 13/26]
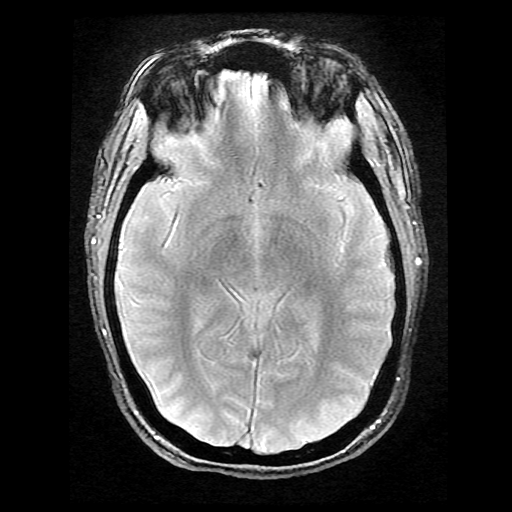

[Series 9: DWI · coronal · 5.0mm · 1.09mm/px · 7 of 66 slices shown (2 of 4)]
[im 1/66]
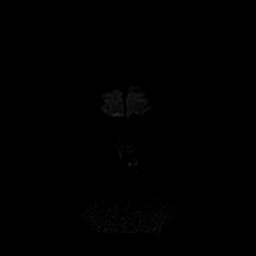
[im 11/66]
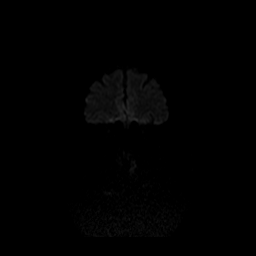
[im 22/66]
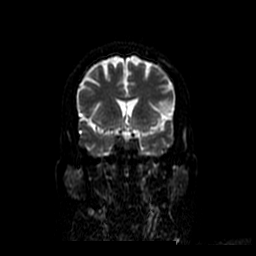
[im 33/66]
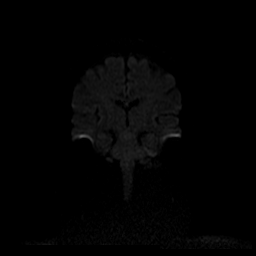
[im 44/66]
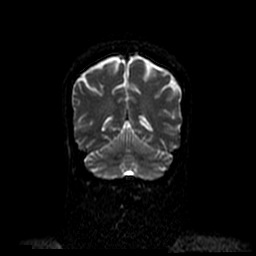
[im 55/66]
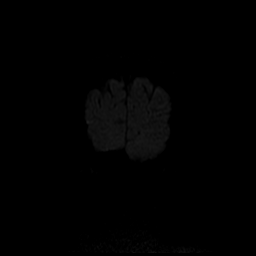
[im 66/66]
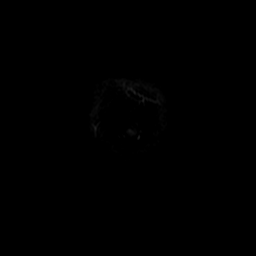

[Series 10: T2 · coronal · 5.0mm · 0.43mm/px · 3 of 28 slices shown (2 of 2)]
[im 1/28]
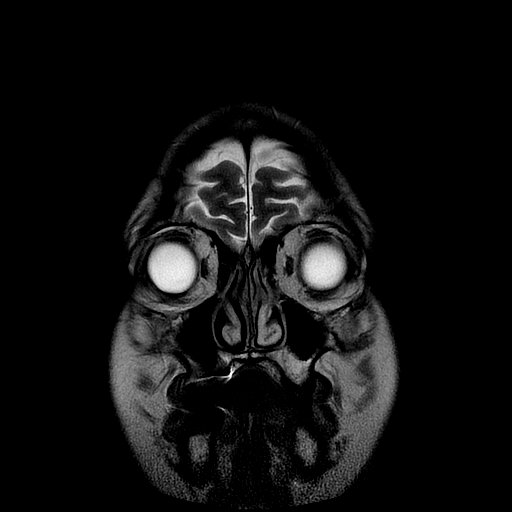
[im 14/28]
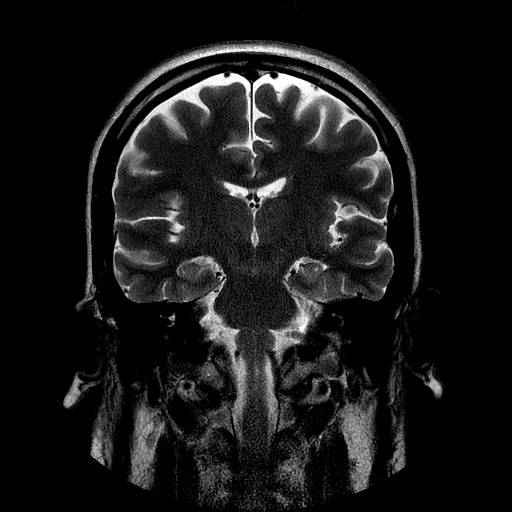
[im 28/28]
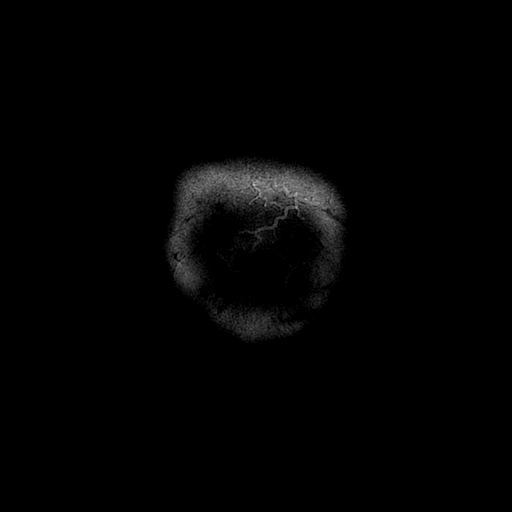

[Series 400: DWI · axial · 3.0mm · 1.09mm/px · z∈[-100,+49]mm · 5 of 51 slices shown (3 of 4)]
[im 1/51]
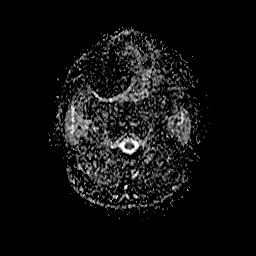
[im 13/51]
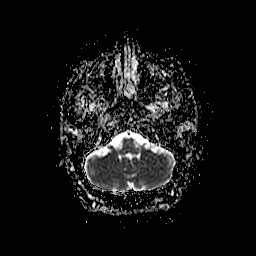
[im 26/51]
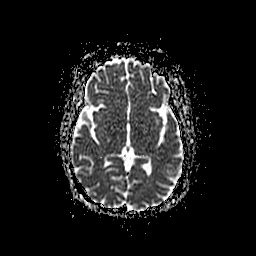
[im 38/51]
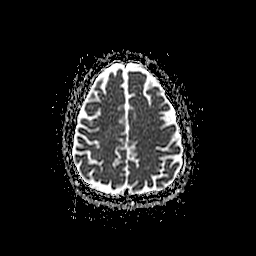
[im 51/51]
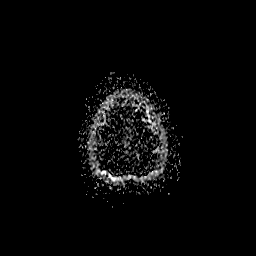

[Series 900: DWI · coronal · 5.0mm · 1.09mm/px · 3 of 33 slices shown (4 of 4)]
[im 1/33]
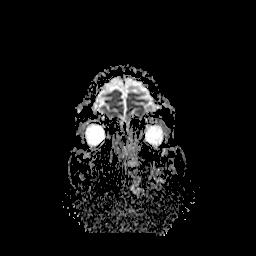
[im 17/33]
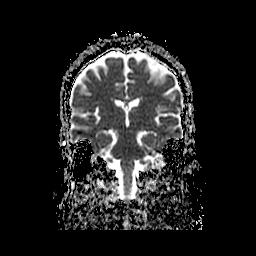
[im 33/33]
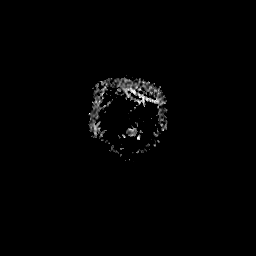

[35 of 48 positions shown; findings below may reference images not displayed]

FINDINGS: No acute infarct, hemorrhage, or mass lesion is present. The
ventricles are of normal size. No significant extraaxial fluid
collection is present.

Flow is present in the major intracranial arteries. No significant
white matter disease is present. The globes and orbits are intact.
The paranasal sinuses and mastoid air cells are clear.

Skullbase is within normal limits. Midline structures are
unremarkable.
IMPRESSION: Negative MRI of the brain.

## 2016-07-07 ENCOUNTER — Ambulatory Visit: Payer: Self-pay | Admitting: Psychology

## 2016-07-10 ENCOUNTER — Encounter (HOSPITAL_BASED_OUTPATIENT_CLINIC_OR_DEPARTMENT_OTHER): Payer: Self-pay | Admitting: Emergency Medicine

## 2016-07-10 ENCOUNTER — Emergency Department (HOSPITAL_BASED_OUTPATIENT_CLINIC_OR_DEPARTMENT_OTHER)
Admission: EM | Admit: 2016-07-10 | Discharge: 2016-07-10 | Disposition: A | Discharge status: Home / Self Care | Payer: Medicare PPO | Attending: Emergency Medicine | Admitting: Emergency Medicine

## 2016-07-10 DIAGNOSIS — Y9389 Activity, other specified: Secondary | ICD-10-CM | POA: Diagnosis not present

## 2016-07-10 DIAGNOSIS — Y998 Other external cause status: Secondary | ICD-10-CM | POA: Insufficient documentation

## 2016-07-10 DIAGNOSIS — E109 Type 1 diabetes mellitus without complications: Secondary | ICD-10-CM | POA: Diagnosis not present

## 2016-07-10 DIAGNOSIS — W260XXA Contact with knife, initial encounter: Secondary | ICD-10-CM | POA: Insufficient documentation

## 2016-07-10 DIAGNOSIS — Y929 Unspecified place or not applicable: Secondary | ICD-10-CM | POA: Diagnosis not present

## 2016-07-10 DIAGNOSIS — S60947A Unspecified superficial injury of left little finger, initial encounter: Secondary | ICD-10-CM | POA: Diagnosis present

## 2016-07-10 DIAGNOSIS — Z794 Long term (current) use of insulin: Secondary | ICD-10-CM | POA: Insufficient documentation

## 2016-07-10 DIAGNOSIS — Z7901 Long term (current) use of anticoagulants: Secondary | ICD-10-CM | POA: Diagnosis not present

## 2016-07-10 DIAGNOSIS — S61217A Laceration without foreign body of left little finger without damage to nail, initial encounter: Secondary | ICD-10-CM | POA: Diagnosis not present

## 2016-07-10 DIAGNOSIS — Z9049 Acquired absence of other specified parts of digestive tract: Secondary | ICD-10-CM | POA: Insufficient documentation

## 2016-07-10 DIAGNOSIS — Z8673 Personal history of transient ischemic attack (TIA), and cerebral infarction without residual deficits: Secondary | ICD-10-CM | POA: Diagnosis not present

## 2016-07-10 DIAGNOSIS — Z79899 Other long term (current) drug therapy: Secondary | ICD-10-CM | POA: Diagnosis not present

## 2016-07-10 MED ORDER — LIDOCAINE HCL 2 % IJ SOLN
10.0000 mL | Freq: Once | INTRAMUSCULAR | Status: AC
  Administered 2016-07-10: 200 mg
  Filled 2016-07-10: qty 20

## 2016-07-10 NOTE — ED Notes (Signed)
Pt verbalizes understanding of d/c instructions and denies any further needs at this time. 

## 2016-07-10 NOTE — ED Provider Notes (Signed)
[facility] Provider Note   CSN: [id number] Arrival date & time: 07/10/16  0222     History   Chief Complaint Chief Complaint  Patient presents with  . Laceration    HPI Maureen [name] is a 46 y.o. female.  The history is provided by the patient.  Laceration    She accidentally cut her left fifth finger while removing the plastic wrap off of a new knife. Last tetanus immunization was 5 years ago. She denies other injury.  Past Medical History:  Diagnosis Date  . Abdominal pain   . Achilles tendon contracture, left    with lisfrabc fracture  . Anxiety   . Arthritis   . Biliary dyskinesia   . Cervical strain   . Closed head injury 2010  . Depression   . Elevated LFTs   . Fibromyalgia   . GERD (gastroesophageal reflux disease)   . History of kidney stones   . Hyperlipidemia   . Hypoglycemia   . Joint pain   . Major depressive disorder   . Migraine    hx of none recent  . Nausea & vomiting   . OSA on CPAP 05/14/2016   Moderate to severe OSA with an AHI of 28/hr now on CPAP at 19cm H2O  . Pneumonia   . Post traumatic stress disorder (PTSD)   . Pulmonary embolism ([place])   . Renal stones   . Skin rash    due to medications  . Sleep trouble   . Stroke [facility])    " series of mini strokes around 2007"  . TIA (transient ischemic attack)   . Wears glasses   . Well controlled type 1 diabetes mellitus with peripheral neuropathy ([place])    Type 1    Patient Active Problem List   Diagnosis Date Noted  . OSA on CPAP 05/14/2016  . Obesity (BMI 30-39.9) 05/14/2016  . Acute deep vein thrombosis (DVT) of femoral vein of left lower extremity ([place]) 02/10/2016  . Diabetic polyneuropathy associated with type 2 diabetes mellitus ([place]) 02/10/2016  . Lisfranc dislocation, left, sequela 12/16/2015  . Short Achilles tendon (acquired), left ankle 12/16/2015  . Chest pain with moderate risk for cardiac etiology 11/29/2015  . Elevated troponin 11/19/2015  . Migraine  headache 11/19/2015  . Neck pain 11/19/2015  . Acute kidney injury ([place]) 02/19/2015  . DKA (diabetic ketoacidoses) ([place]) 10/24/2014  . HLD (hyperlipidemia) 10/24/2014  . Abnormal LFTs 09/03/2014  . Dizziness 09/03/2014  . Acute pulmonary embolism ([place]) 09/03/2014  . Transaminitis 09/02/2014  . GAD (generalized anxiety disorder) 11/23/2013  . PTSD (post-traumatic stress disorder) 11/23/2013  . Severe major depression without psychotic features ([place]) 10/10/2013  . Endometriosis 12/08/2011  . Arthritis of left knee 12/08/2011  . Diabetic retinopathy ([place]) 12/08/2011  . Fibromyalgia 10/23/2011  . DM type 1 (diabetes mellitus, type 1) ([place]) 10/13/2011    Past Surgical History:  Procedure Laterality Date  . ABDOMINAL HYSTERECTOMY  2001  . APPENDECTOMY    . [place]  . CHOLECYSTECTOMY  02/10/2011   Procedure: LAPAROSCOPIC CHOLECYSTECTOMY WITH INTRAOPERATIVE CHOLANGIOGRAM;  Surgeon: Judieth Keens, DO;  Location: [facility];  Service: General;  Laterality: N/A;  . COLONOSCOPY    . DILATION AND CURETTAGE OF UTERUS    . exploratory laparomty  Mar 04, 2012  . EYE SURGERY  2006   laser eye surgery left eye  . KNEE SURGERY Left 2012  . LAPAROSCOPIC APPENDECTOMY N/A 10/12/2012   Procedure: DIAGNOSTIC APPENDECTOMY  LAPAROSCOPIC;  Surgeon: Madilyn Hook, DO;  [facility];  Service: General;  Laterality: N/A;  . lipoma removed  yrs ago  . LIVER BIOPSY    . ORIF ANKLE FRACTURE Left 01/03/2016   Procedure: OPEN REDUCTION INTERNAL FIXATION (ORIF) LEFT LISFRANC JOINT, GASTROC RECESSION;  Surgeon: Newt Minion, MD;  Location: [facility];  Service: Orthopedics;  Laterality: Left;  . ROBOTIC ASSISTED LAPAROSCOPIC LYSIS OF ADHESION N/A 03/04/2012   Procedure: ROBOTIC ASSISTED LAPAROSCOPIC LYSIS OF EXTENSIVE ADHESIONS;  Surgeon: Claiborne Billings A. Pamala Hurry, MD;  Location: [facility];  Service: Gynecology;  Laterality: N/A;  . TONSILLECTOMY  2001    OB History    No data available       Home  Medications    Prior to Admission medications   Medication Sig Start Date End Date Taking? Authorizing Provider  acetaminophen (TYLENOL) 500 MG tablet Take 1 tablet (500 mg total) by mouth every 8 (eight) hours as needed for headache. 11/20/15   Arrien, Jimmy Picket, MD  atorvastatin (LIPITOR) 40 MG tablet Take 40 mg by mouth at bedtime.     [provider]  baclofen (LIORESAL) 10 MG tablet TK 1 T PO TID 06/15/16   [provider]  citalopram (CELEXA) 20 MG tablet Take 1 tablet (20 mg total) by mouth daily. 05/15/16   Nche, Charlene Brooke, NP  gabapentin (NEURONTIN) 300 MG capsule Take 300 mg by mouth daily as needed (for pain).  12/11/15   [provider]  insulin aspart (NOVOLOG) 100 UNIT/ML injection Inject 8 Units into the skin 3 (three) times daily with meals. 03/05/16   [place], FNP  insulin aspart protamine - aspart (NOVOLOG 70/30 MIX) (70-30) 100 UNIT/ML FlexPen Inject into the skin.    [provider]  insulin glargine (LANTUS) 100 UNIT/ML injection Inject 0.28 mLs (28 Units total) into the skin at bedtime. Patient taking differently: Inject 30 Units into the skin at bedtime.  03/05/16   [place] Circle, FNP  lidocaine (LIDODERM) 5 % Place 1 patch onto the skin daily as needed (pain). Remove after 12 hours. 10/30/15   [provider]  ondansetron (ZOFRAN-ODT) 4 MG disintegrating tablet TK 1 T PO Q 8 H PRN NAUSEA FOR UP TO 7 DAYS 06/15/16   [provider]  promethazine (PHENERGAN) 25 MG tablet Take 1 tablet (25 mg total) by mouth every 6 (six) hours as needed for nausea. 06/17/16   Clayton Bibles, PA-C  Suvorexant (BELSOMRA) 10 MG TABS Take 1 tablet by mouth daily. 05/15/16   Nche, Charlene Brooke, NP  tiZANidine (ZANAFLEX) 4 MG tablet TAKE ONE TABLET BY MOUTH EVERY 6 HOURS AS NEEDED FOR MUSCLE SPASM 06/09/16   [provider]  traMADol (ULTRAM) 50 MG tablet TK 1 T PO Q 8 H PRN P 06/22/16   [provider]  warfarin  (COUMADIN) 5 MG tablet TAKE 1 TABLET(5 MG) BY MOUTH DAILY AT 6 PM 07/06/16   [place], Holli Humbles, NP    Family History Family History  Problem Relation Age of Onset  . Cancer Father        lymphoma  . Nephrolithiasis Father   . Vascular Disease Father   . Alcohol abuse Father   . Drug abuse Father   . Cancer Maternal Grandmother        colon  . Hypertension Mother   . Heart disease Mother   . Cataracts Mother   . Depression Mother   . Diabetes Brother   . Drug  abuse Brother   . Mental illness Maternal Grandfather   . Cancer Maternal Grandfather   . Heart disease Paternal Grandmother   . Heart disease Paternal Grandfather   . Suicidality Maternal Uncle     Social History Social History  Substance Use Topics  . Smoking status: Never Smoker  . Smokeless tobacco: Never Used  . Alcohol use Yes     Comment: Socially     Allergies   Cephalexin; Ibuprofen; and Meloxicam   Review of Systems Review of Systems  All other systems reviewed and are negative.    Physical Exam Updated Vital Signs BP 123/75   Pulse 93   Temp 98.2 F (36.8 C) (Oral)   Resp 18   SpO2 97%   Physical Exam  Nursing note and vitals reviewed.  46 year old female, resting comfortably and in no acute distress. Vital signs are normal. Oxygen saturation is 97%, which is normal. Head is normocephalic and atraumatic. PERRLA, EOMI. Oropharynx is clear. Neck is nontender and supple without adenopathy or JVD. Back is nontender and there is no CVA tenderness. Lungs are clear without rales, wheezes, or rhonchi. Chest is nontender. Heart has regular rate and rhythm without murmur. Abdomen is soft, flat, nontender without masses or hepatosplenomegaly and peristalsis is normoactive. Extremities: Laceration on the flexor surface of the left fifth finger middle phalanx near the DIP joint, oriented transversely. Tendon function is normal. Laceration is fairly shallow, but does pull apart when she straightens  her finger.  Skin is warm and dry without rash. Neurologic: Mental status is normal, cranial nerves are intact, there are no motor or sensory deficits.  ED Treatments / Results   Procedures Procedures (including critical care time) LACERATION REPAIR Performed by: HENID,POEUM Authorized by: PNTIR,WERXV Consent: Verbal consent obtained. Risks and benefits: risks, benefits and alternatives were discussed Consent given by: patient Patient identity confirmed: provided demographic data Prepped and Draped in normal sterile fashion Wound explored  Laceration Location: Left fifth finger  Laceration Length: 1.5 cm  No Foreign Bodies seen or palpated  Anesthesia: local infiltration  Local anesthetic: lidocaine 2% without epinephrine  Anesthetic total: 4 ml  Irrigation method: syringe Amount of cleaning: standard  Skin closure: Close   Number of sutures: 3  Technique: Simple interrupted suture with 4-0 nylon   Patient tolerance: Patient tolerated the procedure well with no immediate complications.   Medications Ordered in ED Medications  lidocaine (XYLOCAINE) 2 % (with pres) injection 200 mg (not administered)     Initial Impression / Assessment and Plan / ED Course  I have reviewed the triage vital signs and the nursing notes.  Pertinent labs & imaging results that were available during my care of the patient were reviewed by me and considered in my medical decision making (see chart for details).  Laceration of left fifth finger treated with suture closure. Sutures are to be removed in 7 days. Old records are reviewed confirming Tdap booster given in 2013.  Final Clinical Impressions(s) / ED Diagnoses   Final diagnoses:  Laceration of left little finger, initial encounter    New Prescriptions New Prescriptions   No medications on file     Delora Fuel, MD 40/08/67 [phone]

## 2016-07-10 NOTE — ED Triage Notes (Signed)
Pt has laceration to left small finger from knife. Minimal bleeding noted. Pt holding gauze on wound.

## 2016-07-10 NOTE — Discharge Instructions (Signed)
Sutures need to be removed in 7 days. That can be done here, at your doctor's office, or at an urgent care center.

## 2016-07-10 NOTE — ED Notes (Signed)
Pt finger bleeding on assessment. Finger was covered with gauze and wrapped in coban. Pt in NAD at this time.

## 2016-07-13 ENCOUNTER — Encounter (HOSPITAL_BASED_OUTPATIENT_CLINIC_OR_DEPARTMENT_OTHER): Payer: Self-pay

## 2016-07-13 ENCOUNTER — Emergency Department (HOSPITAL_BASED_OUTPATIENT_CLINIC_OR_DEPARTMENT_OTHER)
Admission: EM | Admit: 2016-07-13 | Discharge: 2016-07-13 | Disposition: A | Discharge status: Home / Self Care | Payer: Medicare PPO | Attending: Emergency Medicine | Admitting: Emergency Medicine

## 2016-07-13 DIAGNOSIS — E1040 Type 1 diabetes mellitus with diabetic neuropathy, unspecified: Secondary | ICD-10-CM | POA: Diagnosis not present

## 2016-07-13 DIAGNOSIS — Y939 Activity, unspecified: Secondary | ICD-10-CM | POA: Insufficient documentation

## 2016-07-13 DIAGNOSIS — Z7901 Long term (current) use of anticoagulants: Secondary | ICD-10-CM | POA: Diagnosis not present

## 2016-07-13 DIAGNOSIS — R200 Anesthesia of skin: Secondary | ICD-10-CM | POA: Insufficient documentation

## 2016-07-13 DIAGNOSIS — Y929 Unspecified place or not applicable: Secondary | ICD-10-CM | POA: Insufficient documentation

## 2016-07-13 DIAGNOSIS — Z79899 Other long term (current) drug therapy: Secondary | ICD-10-CM | POA: Diagnosis not present

## 2016-07-13 DIAGNOSIS — Y999 Unspecified external cause status: Secondary | ICD-10-CM | POA: Diagnosis not present

## 2016-07-13 DIAGNOSIS — S61217A Laceration without foreign body of left little finger without damage to nail, initial encounter: Secondary | ICD-10-CM | POA: Diagnosis not present

## 2016-07-13 DIAGNOSIS — Z794 Long term (current) use of insulin: Secondary | ICD-10-CM | POA: Insufficient documentation

## 2016-07-13 DIAGNOSIS — W260XXA Contact with knife, initial encounter: Secondary | ICD-10-CM | POA: Diagnosis not present

## 2016-07-13 DIAGNOSIS — S60947A Unspecified superficial injury of left little finger, initial encounter: Secondary | ICD-10-CM | POA: Diagnosis present

## 2016-07-13 DIAGNOSIS — S61217D Laceration without foreign body of left little finger without damage to nail, subsequent encounter: Secondary | ICD-10-CM

## 2016-07-13 NOTE — Discharge Instructions (Signed)
It was my pleasure taking care of you today!   If numbness to the finger does not improve, you will need to see a hand surgeon. Please call the clinic listed to schedule a follow up appointment.   Ice affected area for pain relief. You can also take tylenol as needed for additional pain relief.   You will still need to get your stitches taken out in 4 days. You can return to ER or see your primary care physician for this.  Return to ER for new or worsening symptoms, any additional concerns.

## 2016-07-13 NOTE — ED Notes (Signed)
Pt states " I am unhappy with my care, I have waiting for hrs and received no medication for pain " offered tylenol for pain , pt refused , states " that doesn't  work that why im here" JAmie PA back in to see pt, no new meds given

## 2016-07-13 NOTE — ED Provider Notes (Signed)
[facility] DEPT [facility] Provider Note   CSN: [id number] Arrival date & time: 07/13/16  1155     History   Chief Complaint Chief Complaint  Patient presents with  . Finger Injury    HPI Maureen [name] is a 46 y.o. female.  The history is provided by the patient and medical records. No language interpreter was used.   Maureen [name] is a 46 y.o. female  with a PMH of DM, fibromyalgia who presents to the Emergency Department complaining of persistent left pinkie finger pain and numbness x 3 days. Patient states that she cut her finger on a new knife 3 days ago. She was seen in the emergency department at that time where the laceration was repaired. She states that she was told that it would be of little sore, but it should not hurt for several days. She has been taking Tylenol, however still having pain at the incision site. She also notes numbness of the distal aspect of the finger which has been persistent as well.    Past Medical History:  Diagnosis Date  . Abdominal pain   . Achilles tendon contracture, left    with lisfrabc fracture  . Anxiety   . Arthritis   . Biliary dyskinesia   . Cervical strain   . Closed head injury 2010  . Depression   . Elevated LFTs   . Fibromyalgia   . GERD (gastroesophageal reflux disease)   . History of kidney stones   . Hyperlipidemia   . Hypoglycemia   . Joint pain   . Major depressive disorder   . Migraine    hx of none recent  . Nausea & vomiting   . OSA on CPAP 05/14/2016   Moderate to severe OSA with an AHI of 28/hr now on CPAP at 19cm H2O  . Pneumonia   . Post traumatic stress disorder (PTSD)   . Pulmonary embolism ([place])   . Renal stones   . Skin rash    due to medications  . Sleep trouble   . Stroke [facility])    " series of mini strokes around 2007"  . TIA (transient ischemic attack)   . Wears glasses   . 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 OSA on CPAP 05/14/2016  . Obesity (BMI 30-39.9) 05/14/2016  . Acute deep vein thrombosis (DVT) of femoral vein of left lower extremity ([place]) 02/10/2016  . Diabetic polyneuropathy associated with type 2 diabetes mellitus ([place]) 02/10/2016  . Lisfranc dislocation, left, sequela 12/16/2015  . Short Achilles tendon (acquired), left ankle 12/16/2015  . Chest pain with moderate risk for cardiac etiology 11/29/2015  . Elevated troponin 11/19/2015  . Migraine headache 11/19/2015  . Neck pain 11/19/2015  . Acute kidney injury ([place]) 02/19/2015  . DKA (diabetic ketoacidoses) ([place]) 10/24/2014  . HLD (hyperlipidemia) 10/24/2014  . Abnormal LFTs 09/03/2014  . Dizziness 09/03/2014  . Acute pulmonary embolism ([place]) 09/03/2014  . Transaminitis 09/02/2014  . GAD (generalized anxiety disorder) 11/23/2013  . PTSD (post-traumatic stress disorder) 11/23/2013  . Severe major depression without psychotic features ([place]) 10/10/2013  . Endometriosis 12/08/2011  . Arthritis of left knee 12/08/2011  . Diabetic retinopathy ([place]) 12/08/2011  . Fibromyalgia 10/23/2011  . DM type 1 (diabetes mellitus, type 1) ([place]) 10/13/2011    Past Surgical History:  Procedure Laterality Date  . ABDOMINAL HYSTERECTOMY  2001  .  APPENDECTOMY    . [place]  . CHOLECYSTECTOMY  02/10/2011   Procedure: LAPAROSCOPIC CHOLECYSTECTOMY WITH INTRAOPERATIVE CHOLANGIOGRAM;  Surgeon: Judieth Keens, DO;  Location: [facility];  Service: General;  Laterality: N/A;  . COLONOSCOPY    . DILATION AND CURETTAGE OF UTERUS    . exploratory laparomty  Mar 04, 2012  . EYE SURGERY  2006   laser eye surgery left eye  . KNEE SURGERY Left 2012  . LAPAROSCOPIC APPENDECTOMY N/A 10/12/2012   Procedure: DIAGNOSTIC APPENDECTOMY LAPAROSCOPIC;  Surgeon: Madilyn Hook, DO;  Location: [facility];  Service: General;  Laterality: N/A;  . lipoma removed  yrs ago  . LIVER BIOPSY    . ORIF ANKLE FRACTURE Left 01/03/2016    Procedure: OPEN REDUCTION INTERNAL FIXATION (ORIF) LEFT LISFRANC JOINT, GASTROC RECESSION;  Surgeon: Newt Minion, MD;  Location: [facility];  Service: Orthopedics;  Laterality: Left;  . ROBOTIC ASSISTED LAPAROSCOPIC LYSIS OF ADHESION N/A 03/04/2012   Procedure: ROBOTIC ASSISTED LAPAROSCOPIC LYSIS OF EXTENSIVE ADHESIONS;  Surgeon: Claiborne Billings A. Pamala Hurry, MD;  Location: [facility];  Service: Gynecology;  Laterality: N/A;  . TONSILLECTOMY  2001    OB History    No data available       Home Medications    Prior to Admission medications   Medication Sig Start Date End Date Taking? Authorizing Provider  acetaminophen (TYLENOL) 500 MG tablet Take 1 tablet (500 mg total) by mouth every 8 (eight) hours as needed for headache. 11/20/15   Arrien, Jimmy Picket, MD  atorvastatin (LIPITOR) 40 MG tablet Take 40 mg by mouth at bedtime.     [provider]  baclofen (LIORESAL) 10 MG tablet TK 1 T PO TID 06/15/16   [provider]  citalopram (CELEXA) 20 MG tablet Take 1 tablet (20 mg total) by mouth daily. 05/15/16   Nche, Charlene Brooke, NP  gabapentin (NEURONTIN) 300 MG capsule Take 300 mg by mouth daily as needed (for pain).  12/11/15   [provider]  insulin aspart (NOVOLOG) 100 UNIT/ML injection Inject 8 Units into the skin 3 (three) times daily with meals. 03/05/16   Golden Circle, FNP  insulin aspart protamine - aspart (NOVOLOG 70/30 MIX) (70-30) 100 UNIT/ML FlexPen Inject into the skin.    [provider]  insulin glargine (LANTUS) 100 UNIT/ML injection Inject 0.28 mLs (28 Units total) into the skin at bedtime. Patient taking differently: Inject 30 Units into the skin at bedtime.  03/05/16   Golden Circle, FNP  lidocaine (LIDODERM) 5 % Place 1 patch onto the skin daily as needed (pain). Remove after 12 hours. 10/30/15   [provider]  ondansetron (ZOFRAN-ODT) 4 MG disintegrating tablet TK 1 T PO Q 8 H PRN NAUSEA FOR UP TO 7 DAYS 06/15/16   [provider]  promethazine (PHENERGAN) 25 MG tablet Take 1 tablet (25 mg total) by mouth every 6 (six) hours as needed for nausea. 06/17/16   Clayton Bibles, PA-C  Suvorexant (BELSOMRA) 10 MG TABS Take 1 tablet by mouth daily. 05/15/16   Nche, Charlene Brooke, NP  tiZANidine (ZANAFLEX) 4 MG tablet TAKE ONE TABLET BY MOUTH EVERY 6 HOURS AS NEEDED FOR MUSCLE SPASM 06/09/16   [provider]  traMADol (ULTRAM) 50 MG tablet TK 1 T PO Q 8 H PRN P 06/22/16   [provider]  warfarin (COUMADIN) 5 MG tablet TAKE 1 TABLET(5 MG) BY MOUTH DAILY AT 6 PM 07/06/16   [place], Holli Humbles,  NP    Family History Family History  Problem Relation Age of Onset  . Cancer Father        lymphoma  . Nephrolithiasis Father   . Vascular Disease Father   . Alcohol abuse Father   . Drug abuse Father   . Cancer Maternal Grandmother        colon  . Hypertension Mother   . Heart disease Mother   . Cataracts Mother   . Depression Mother   . Diabetes Brother   . Drug abuse Brother   . Mental illness Maternal Grandfather   . Cancer Maternal Grandfather   . Heart disease Paternal Grandmother   . Heart disease Paternal Grandfather   . Suicidality Maternal Uncle     Social History Social History  Substance Use Topics  . Smoking status: Never Smoker  . Smokeless tobacco: Never Used  . Alcohol use Yes     Comment: Socially     Allergies   Cephalexin; Ibuprofen; and Meloxicam   Review of Systems Review of Systems  Musculoskeletal: Positive for myalgias.  Neurological: Positive for numbness.     Physical Exam Updated Vital Signs BP 140/75 (BP Location: Left Arm)   Pulse 86   Temp 98.2 F (36.8 C) (Oral)   Resp 16   Wt 81.8 kg (180 lb 5.4 oz)   SpO2 96%   BMI 30.95 kg/m   Physical Exam  Constitutional: She appears well-developed and well-nourished. No distress.  HENT:  Head: Normocephalic and atraumatic.  Neck: Neck supple.  Cardiovascular: Normal rate, regular rhythm and normal  heart sounds.   No murmur heard. Pulmonary/Chest: Effort normal and breath sounds normal. No respiratory distress. She has no wheezes. She has no rales.  Musculoskeletal:  Left hand with full ROM and good strength. 5th digit with tenderness at laceration site. Decreased sensation to finger tip distal to laceration site.  Neurological: She is alert.  Skin: Skin is warm and dry. Capillary refill takes less than 2 seconds.  1.5 cm laceration to left pinkie finger with suturing in place. Skin surrounding is c/d/i with no surrounding erythema.  Nursing note and vitals reviewed.    ED Treatments / Results  Labs (all labs ordered are listed, but only abnormal results are displayed) Labs Reviewed - No data to display  EKG  EKG Interpretation None       Radiology No results found.  Procedures Procedures (including critical care time)  Medications Ordered in ED Medications - No data to display   Initial Impression / Assessment and Plan / ED Course  I have reviewed the triage vital signs and the nursing notes.  Pertinent labs & imaging results that were available during my care of the patient were reviewed by me and considered in my medical decision making (see chart for details).    Maureen [name] is a 46 y.o. female who presents to ED for persistent pain and numbness to pinkie finger 3 days after laceration repair. Wound is very well-healed with no signs of infection. Appears to be healing as expected. Will have patient follow up with hand surgery if numbness distal to lac does not improve. Still needs to have suture removal in 3-4 days. Patient upset that I did not treat her pain. Ice and tylenol encouraged. Do not believe any further pain medication is warranted. Evaluation does not show pathology that would require ongoing emergent intervention or inpatient treatment. All questions answered.    Final Clinical Impressions(s) / ED Diagnoses  Final diagnoses:  Laceration of  left little finger without foreign body without damage to nail, subsequent encounter  Numbness of finger    New Prescriptions Discharge Medication List as of 07/13/2016  3:45 PM       Ward, Ozella Almond, PA-C 07/13/16 1643    Tegeler, Gwenyth Allegra, MD 07/13/16 [phone]

## 2016-07-13 NOTE — ED Triage Notes (Addendum)
Pt c/o pain to left pinky finger-injured 6/22-sutures placed-pt state she is changing dsg daily-removed kling/coban-advised pt that the coban was too tight-skin WNL and area is healing well

## 2016-07-22 IMAGING — DX DG CHEST 2V
2 series · 2 of 2 positions shown · non-contrast
Comparison: CTA of the chest dated 09/03/2014

CLINICAL DATA: Chest pain and shortness of breath. Patient was
diagnosed with pulmonary embolus last week.

EXAM:
CHEST  2 VIEW

[chest pa]
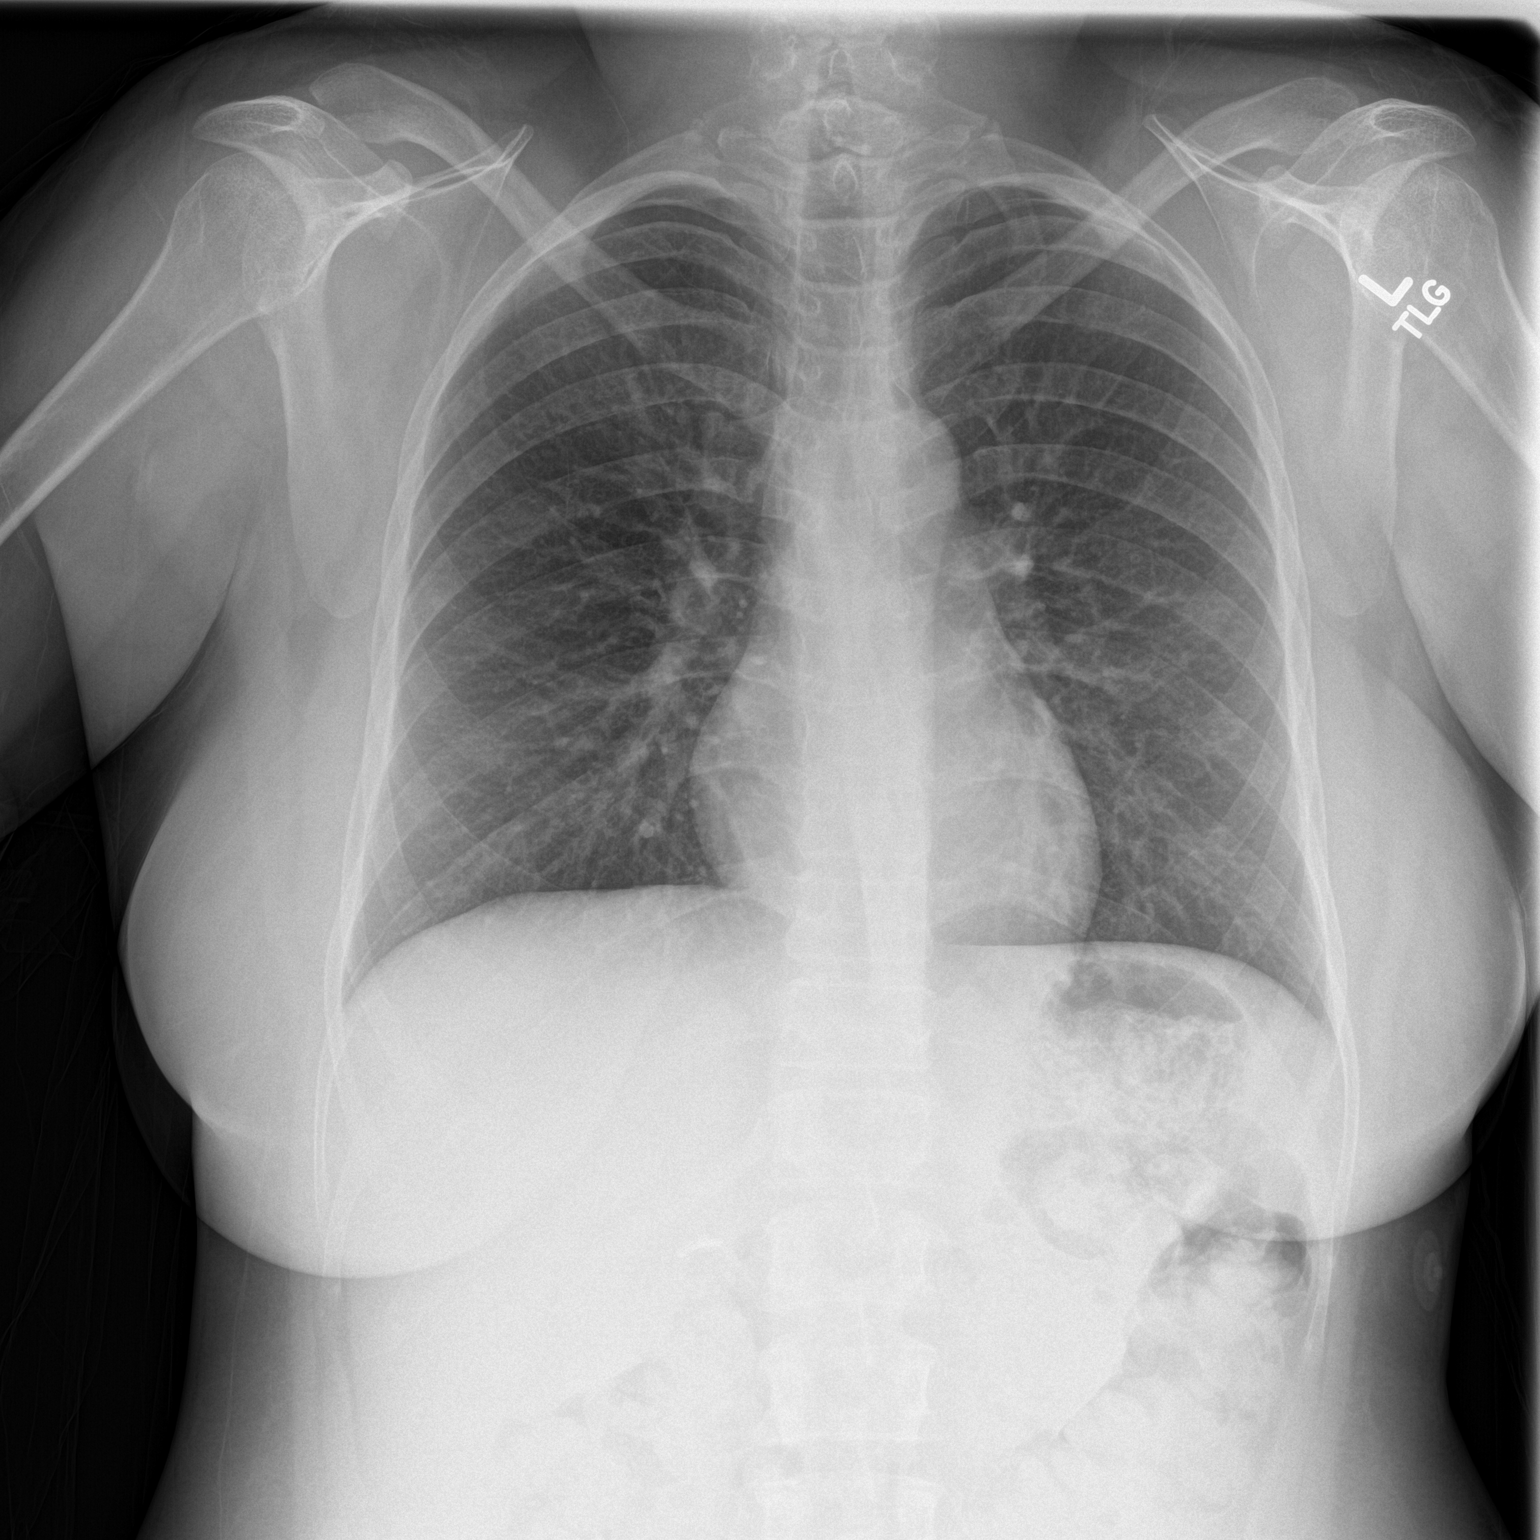

[chest lat]
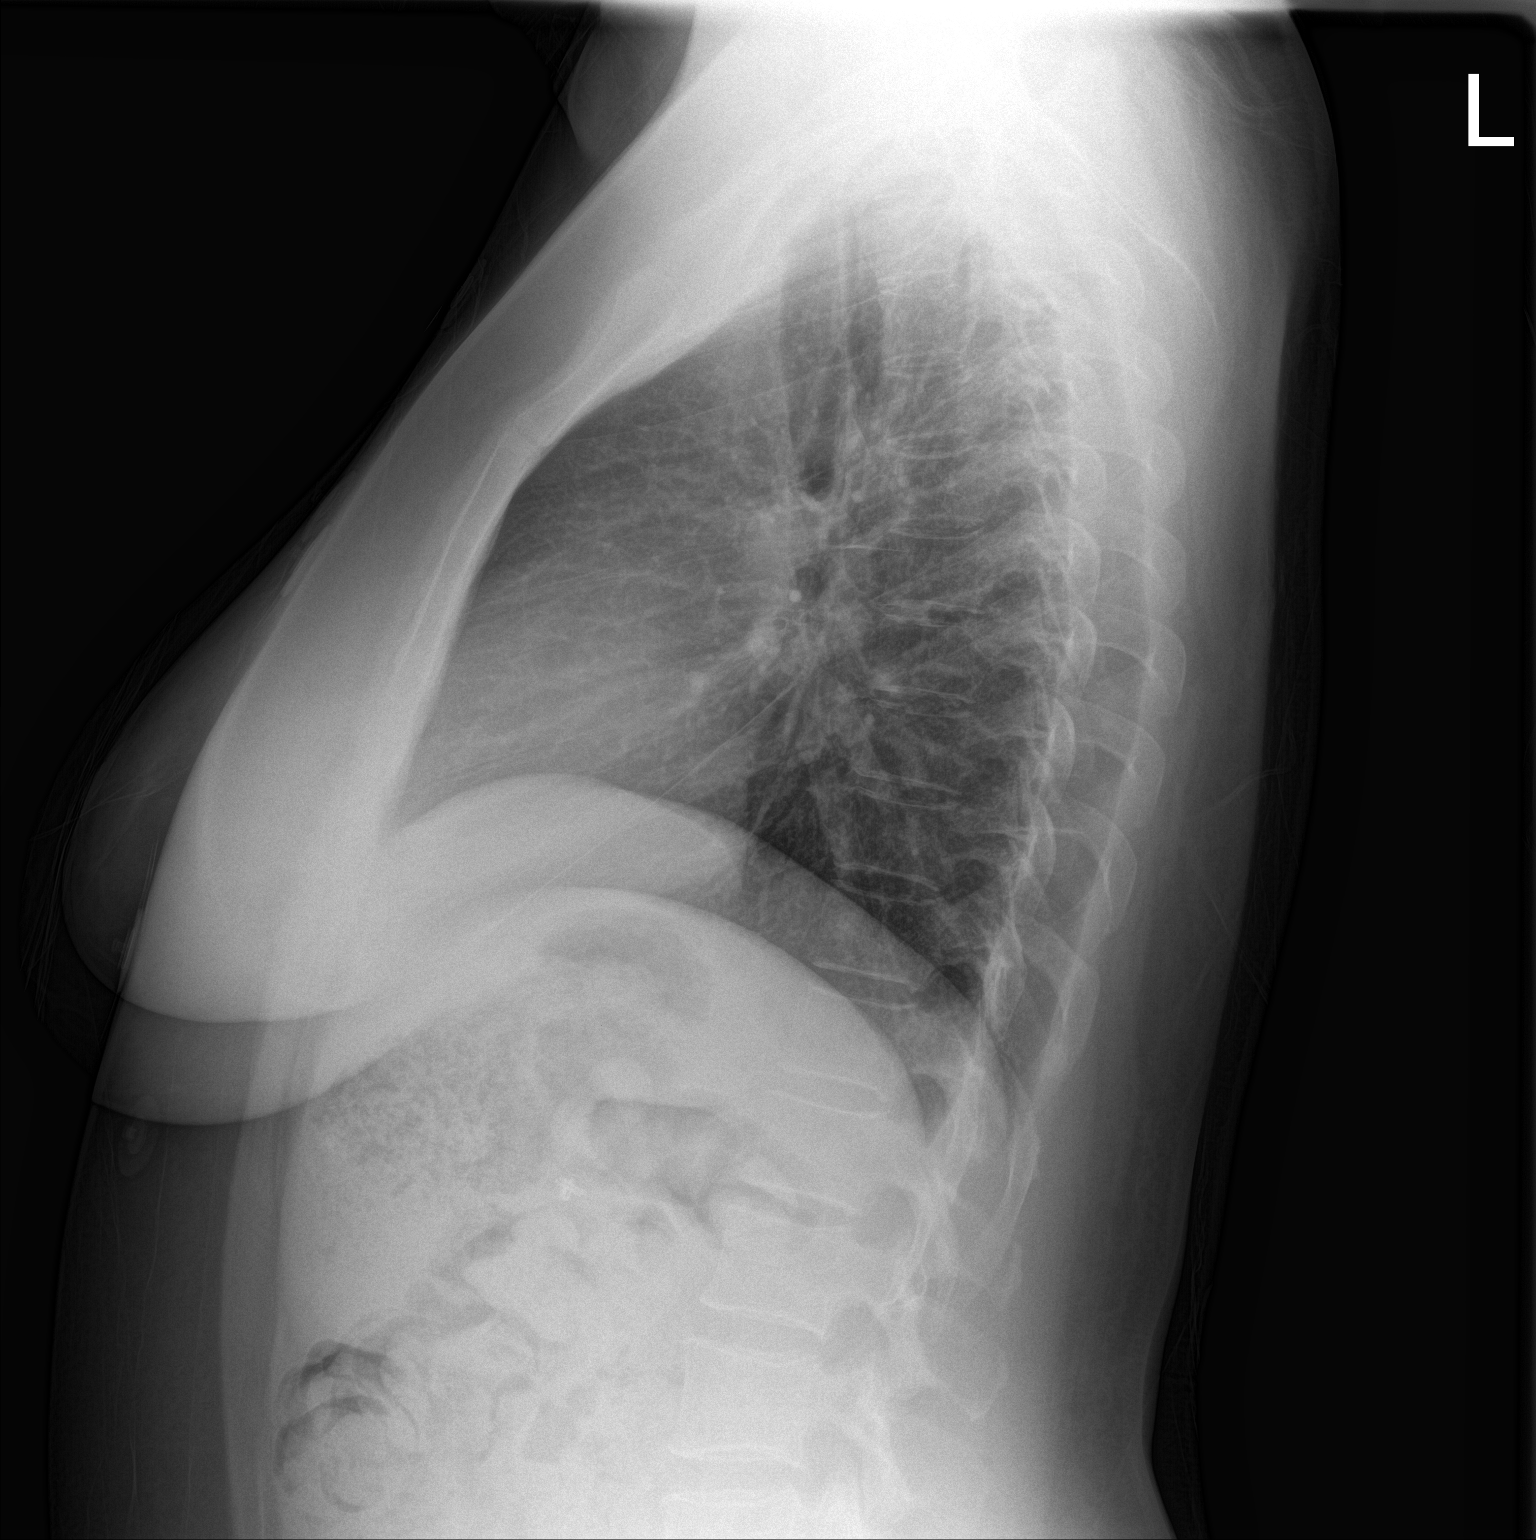

[2 of 2 positions shown; findings below may reference images not displayed]

FINDINGS: Cardiomediastinal silhouette is normal. Mediastinal contours appear
intact.

There is no evidence of focal airspace consolidation, pleural
effusion or pneumothorax.

Osseous structures are without acute abnormality. Soft tissues are
grossly normal.
IMPRESSION: No radiographic evidence of acute cardiopulmonary abnormality.

## 2016-07-31 IMAGING — CR DG CHEST 2V
2 series · 2 of 2 positions shown · non-contrast
Comparison: Chest CT dated 09/19/2014

CLINICAL DATA: 43-year-old female the history of pulmonary embolism
presenting with chest pain

EXAM:
CHEST  2 VIEW

[PA]
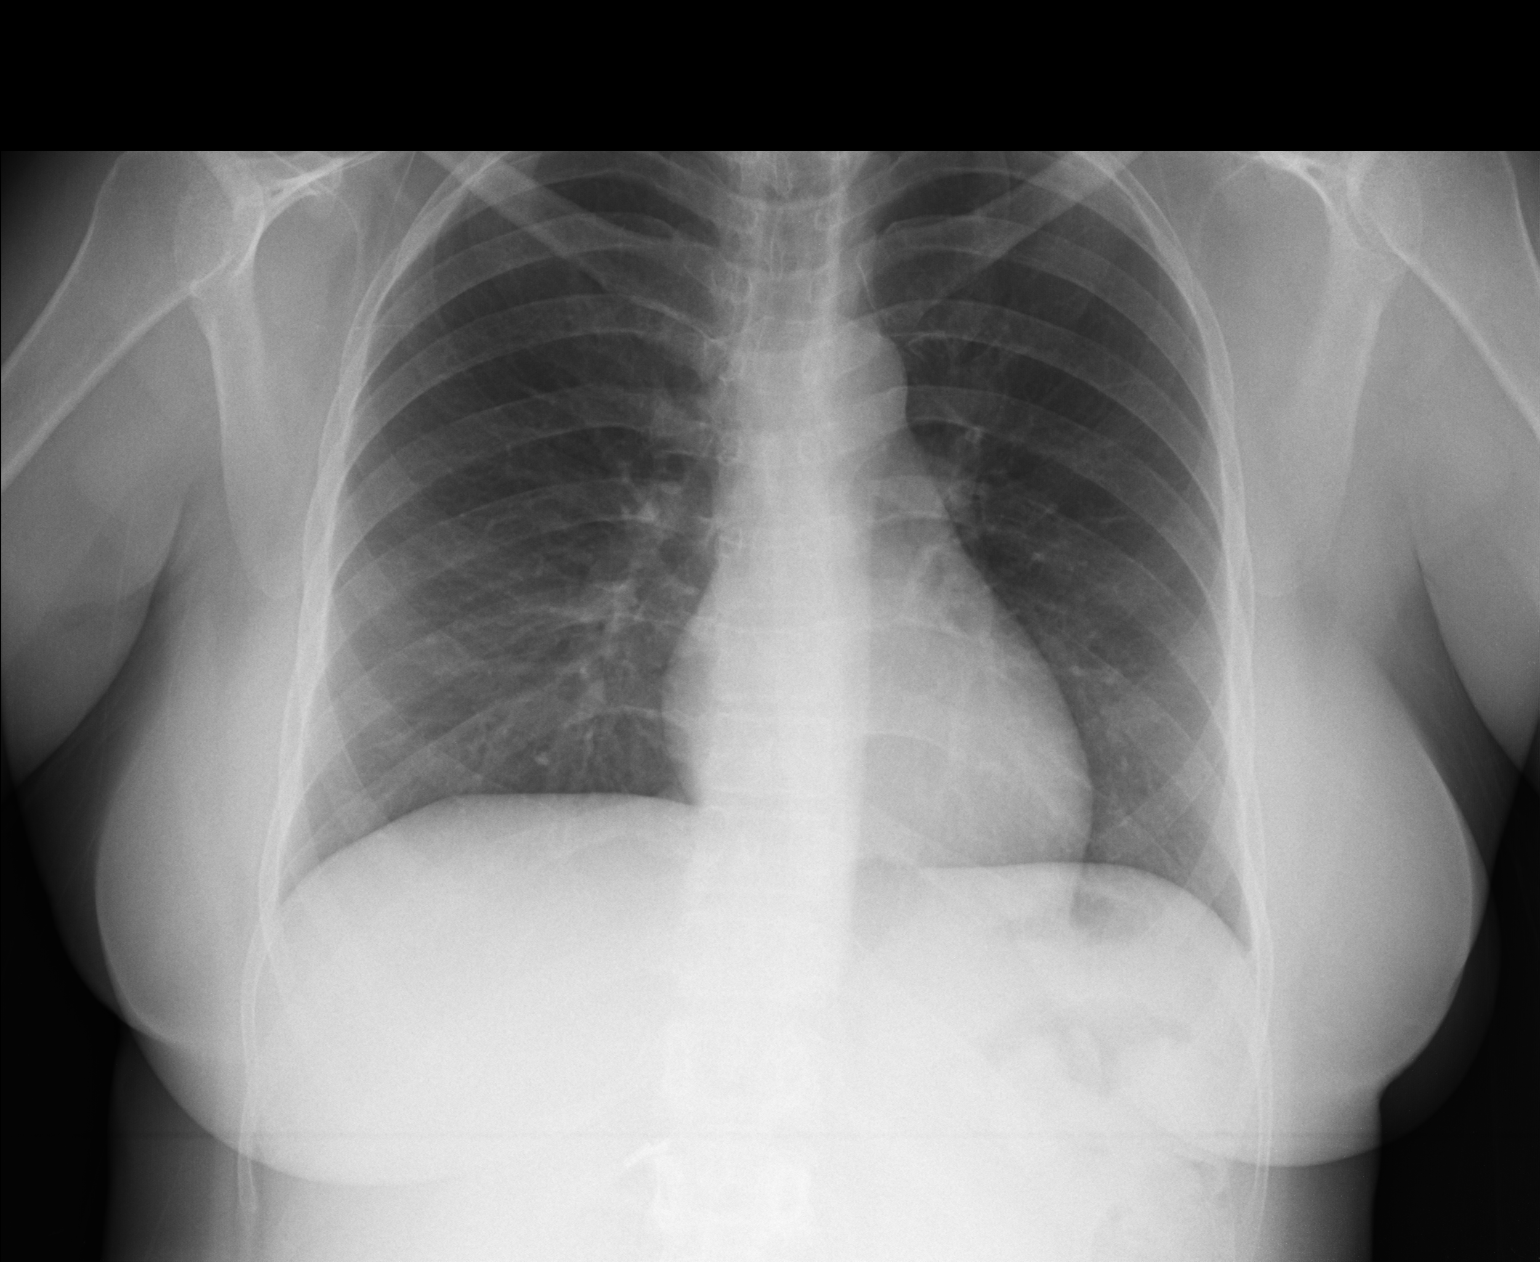

[lateral]
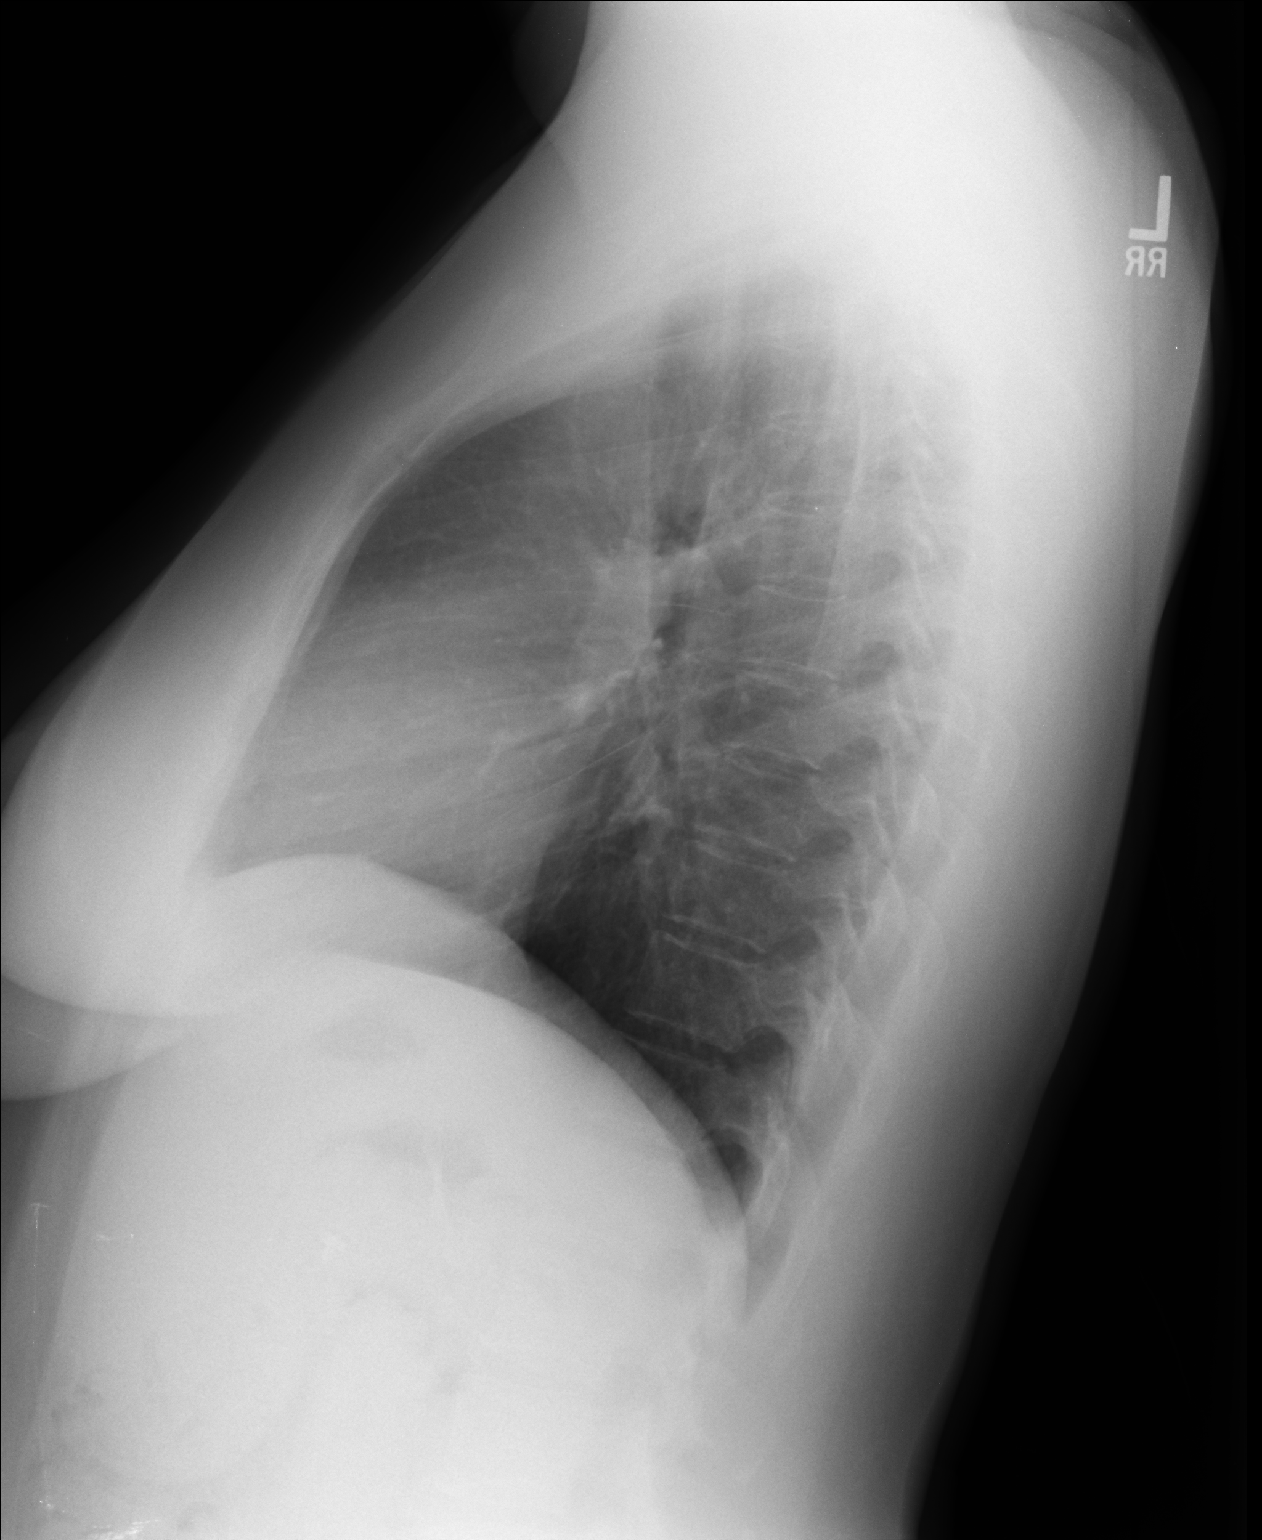

[2 of 2 positions shown; findings below may reference images not displayed]

FINDINGS: The heart size and mediastinal contours are within normal limits.
Both lungs are clear. The visualized skeletal structures are
unremarkable.
IMPRESSION: No active cardiopulmonary disease.

## 2016-08-03 IMAGING — CR DG CHEST 2V
2 series · 2 of 2 positions shown · non-contrast
Comparison: Radiograph dated 09/28/2014

CLINICAL DATA: 43-year-old female with chest pain

EXAM:
CHEST  2 VIEW

[chest pa]
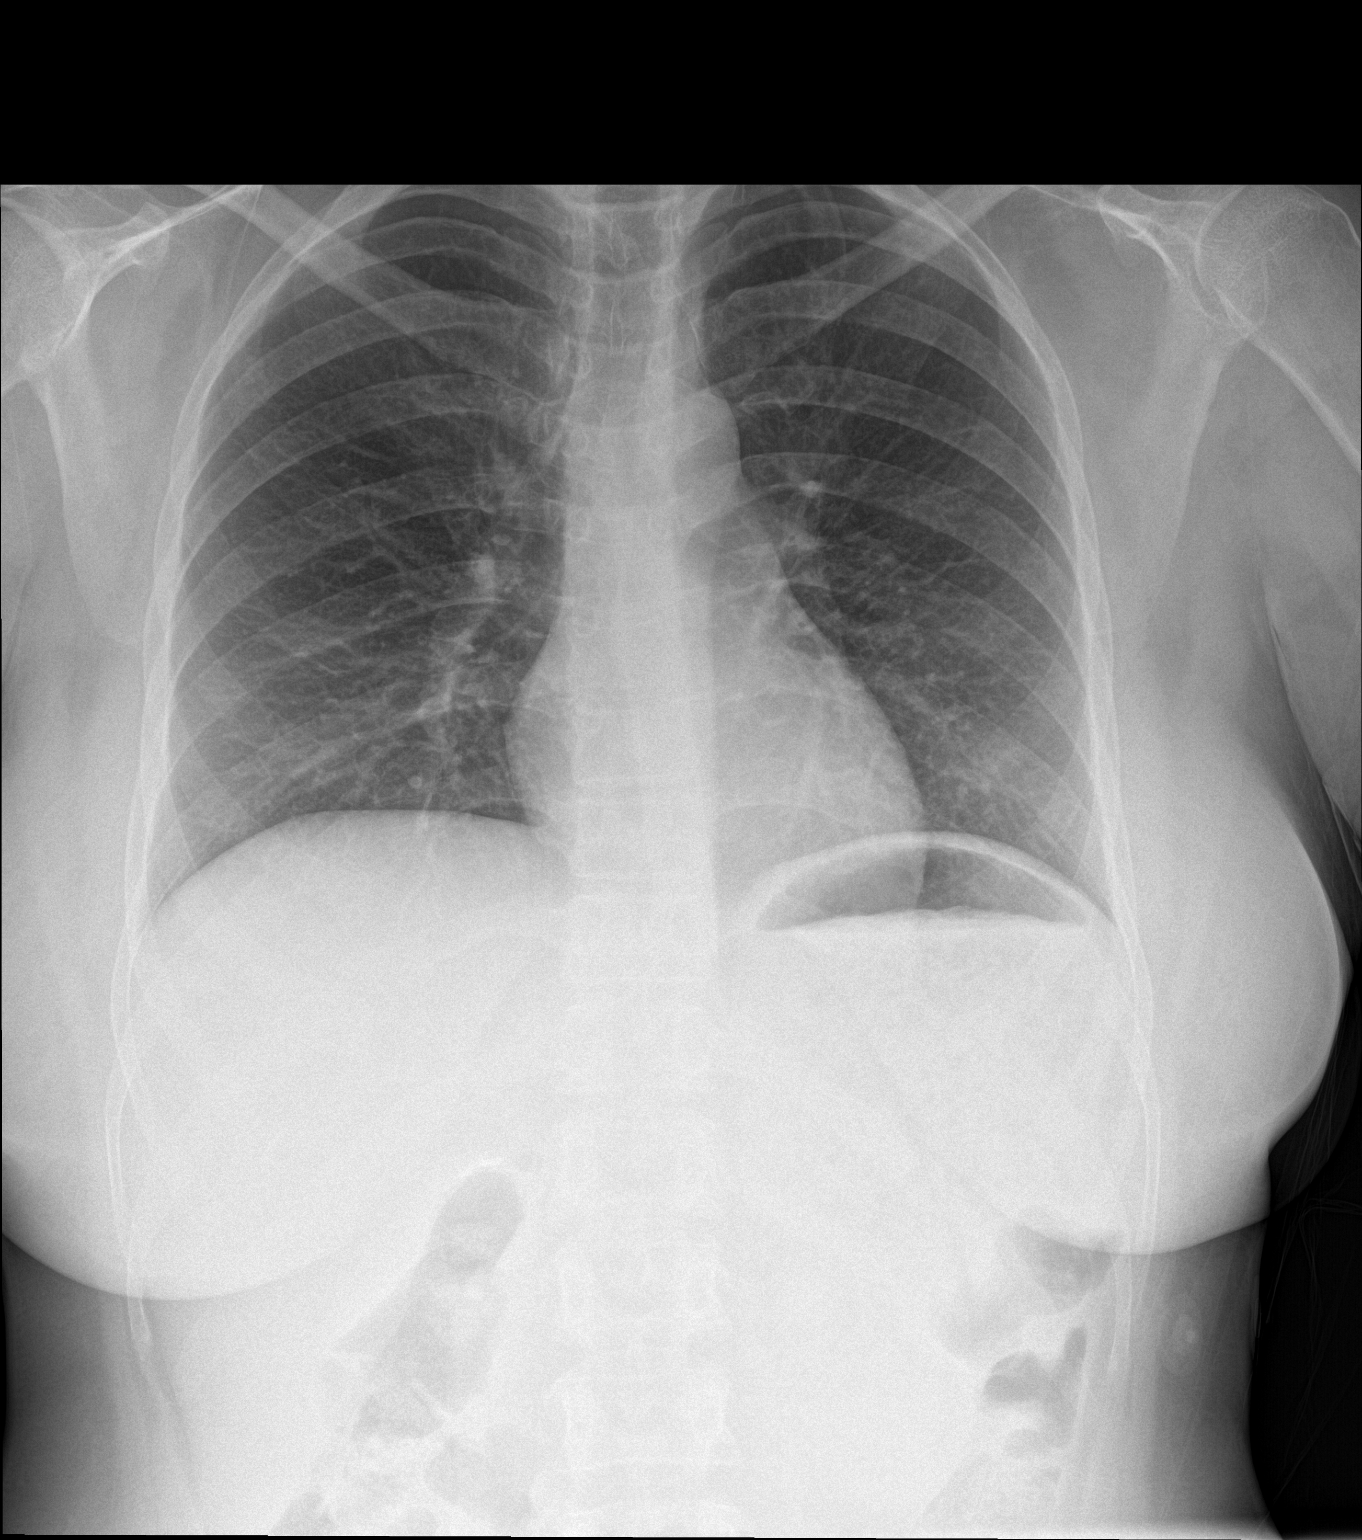

[chest lat]
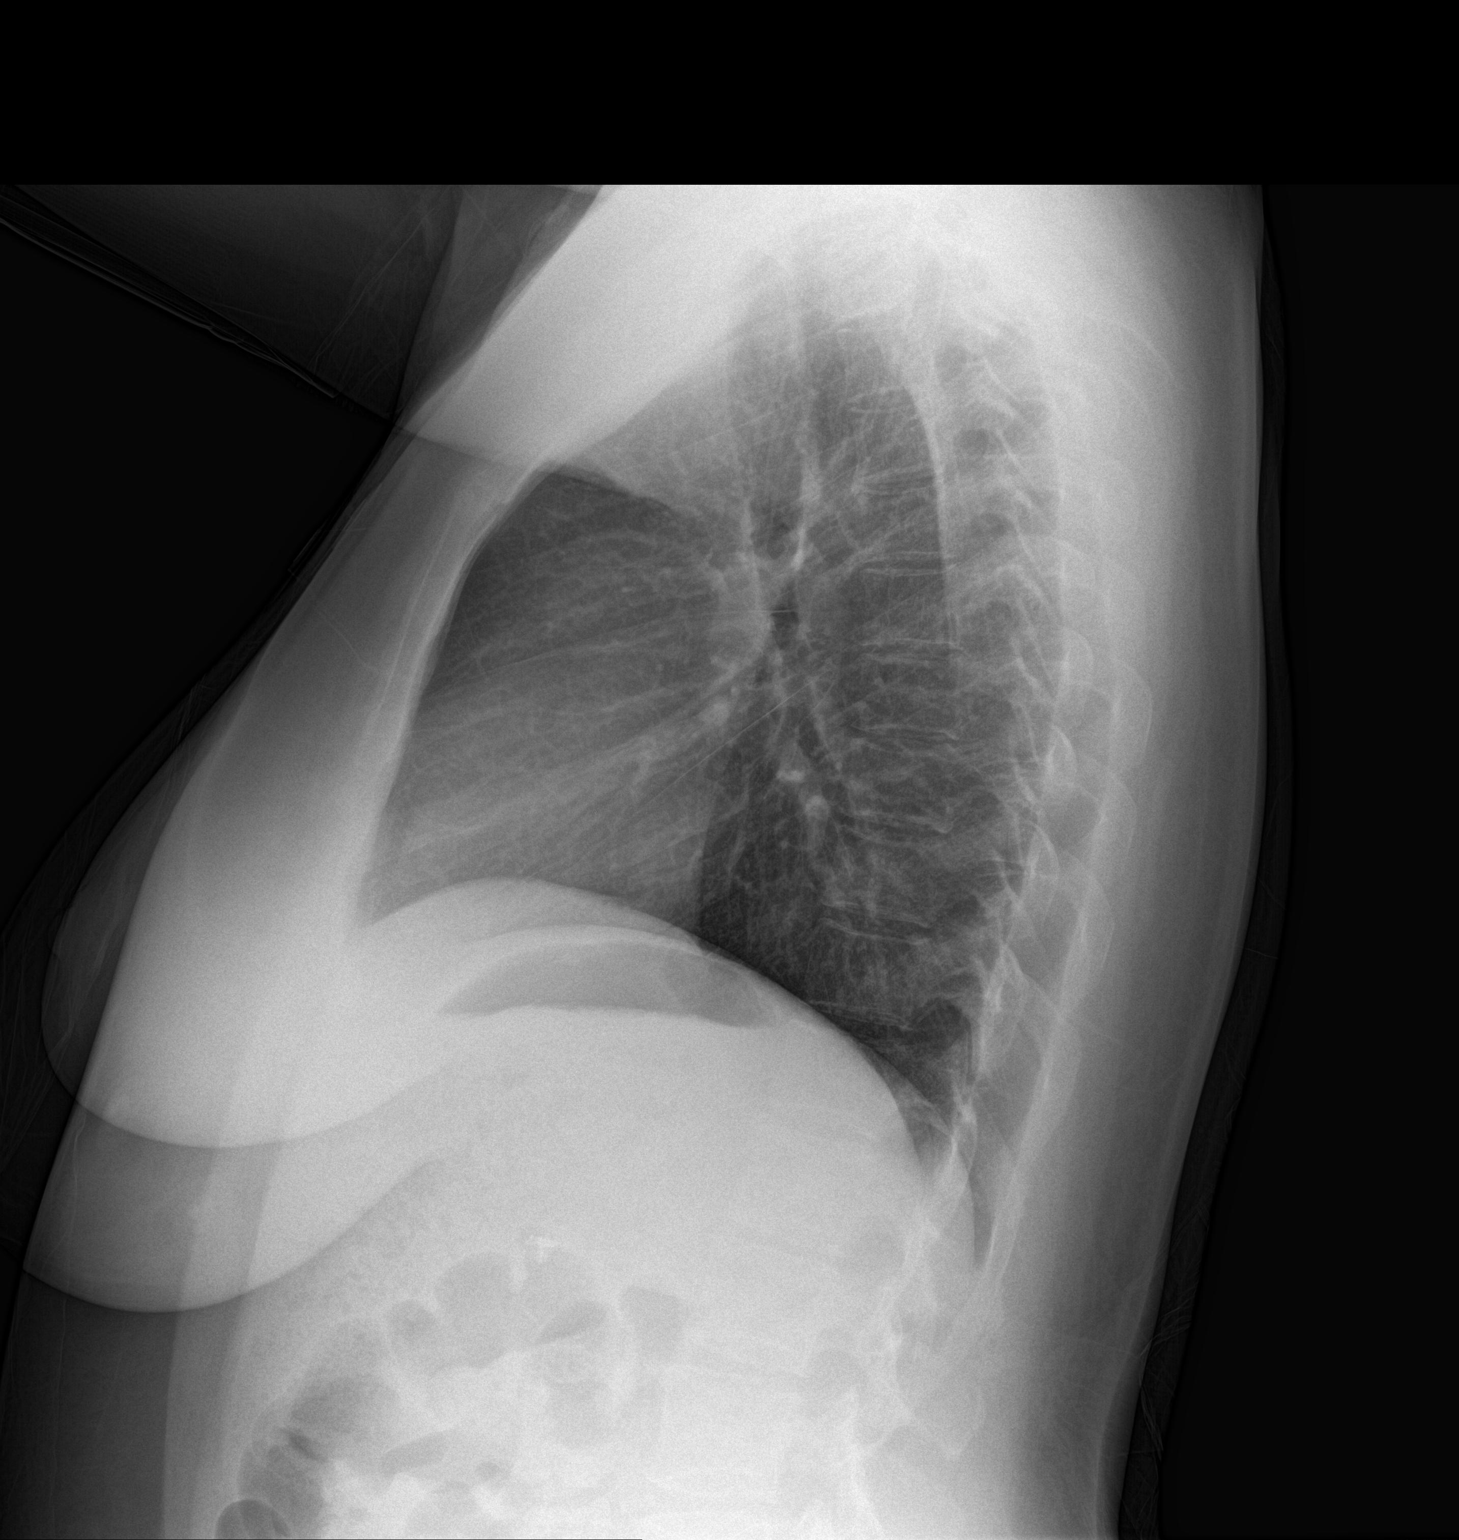

[2 of 2 positions shown; findings below may reference images not displayed]

FINDINGS: The heart size and mediastinal contours are within normal limits.
Both lungs are clear. The visualized skeletal structures are
unremarkable.
IMPRESSION: No active cardiopulmonary disease.

## 2016-08-20 IMAGING — DX DG CHEST 2V
2 series · 2 of 2 positions shown · non-contrast
Comparison: Seven days ago

CLINICAL DATA: Chest pain. Diagnosed with PE and pneumonia in the
past 2 weeks

EXAM:
CHEST  2 VIEW

[chest pa]
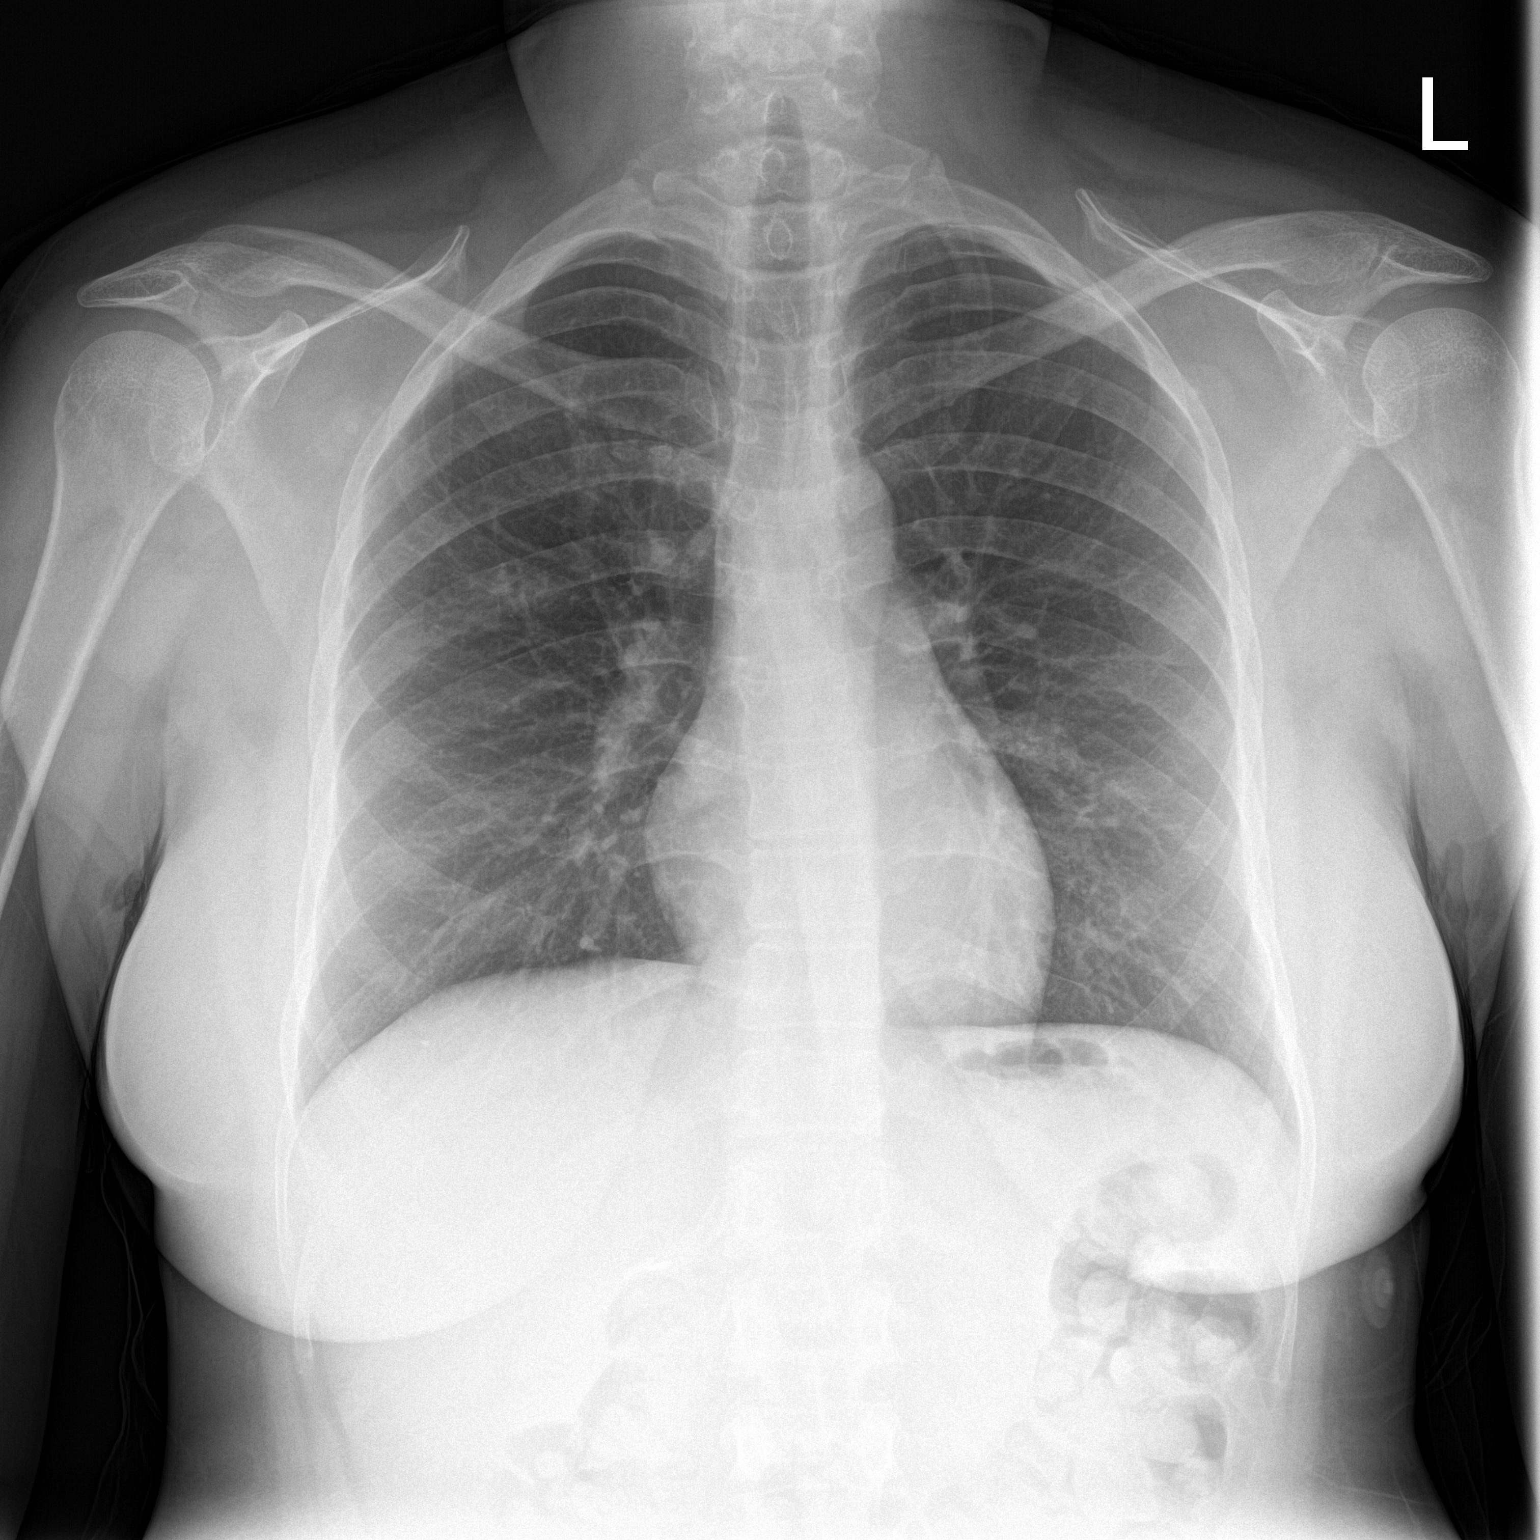

[chest lat]
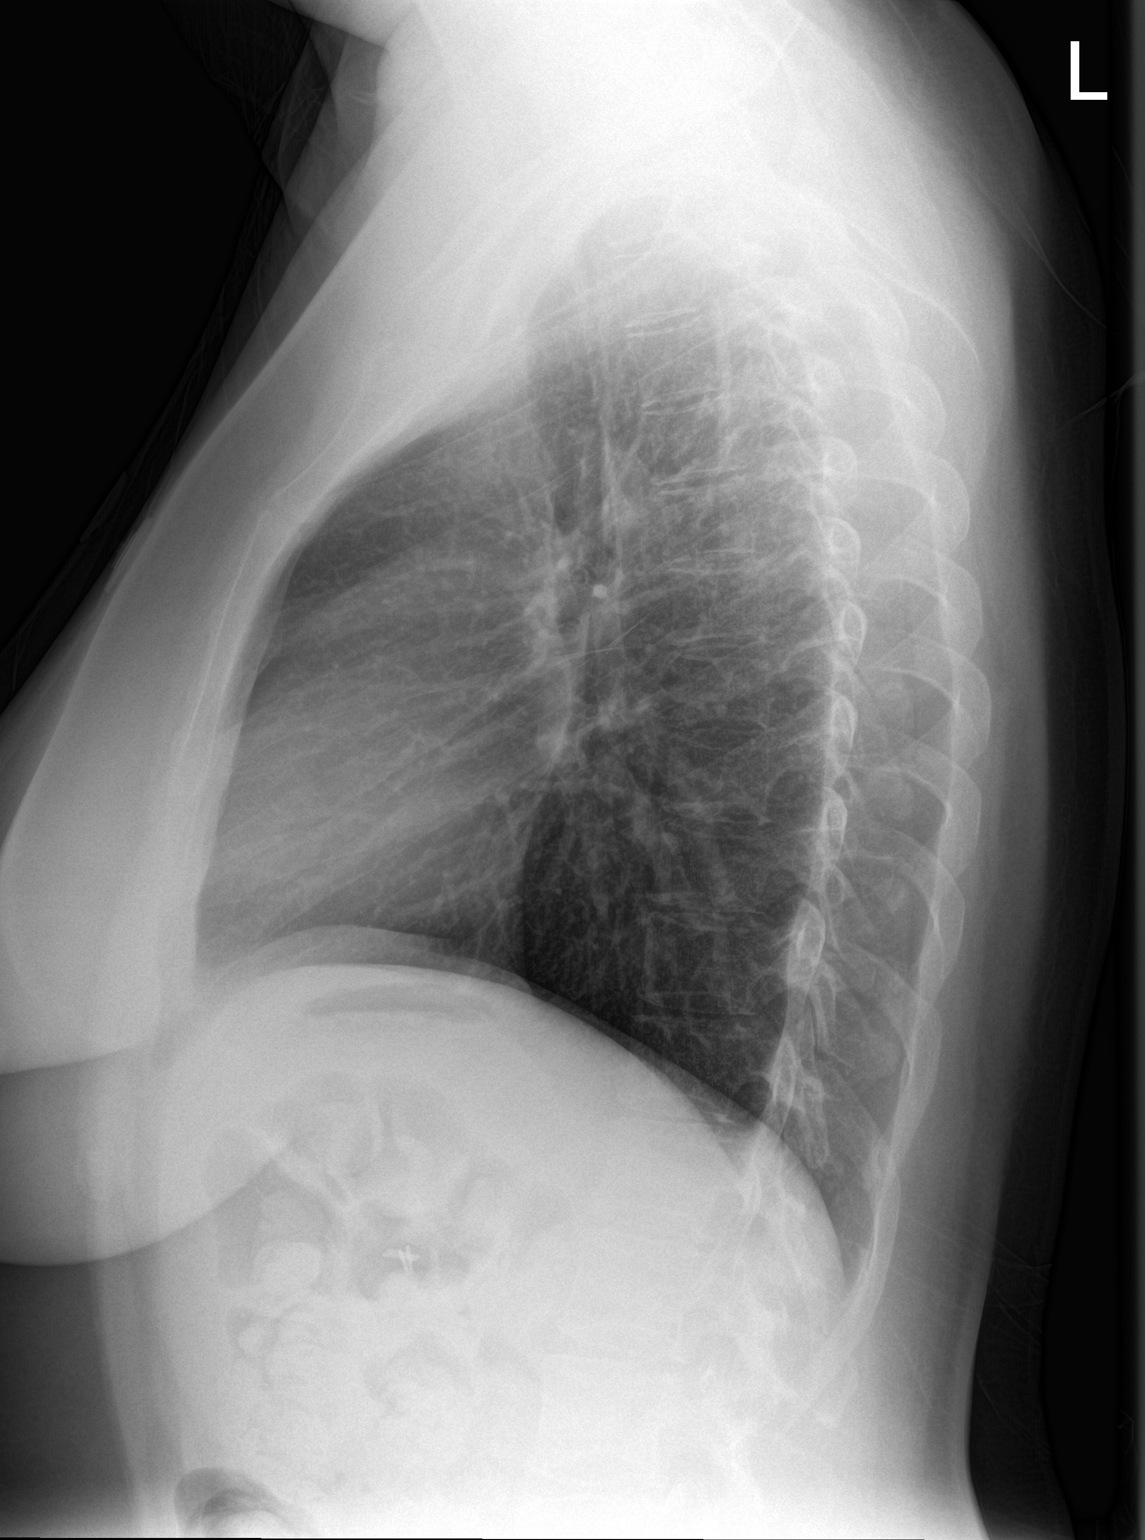

[2 of 2 positions shown; findings below may reference images not displayed]

FINDINGS: Good but partial clearance of right upper lobe opacity. There is no
edema, effusion, or pneumothorax. Normal heart size and mediastinal
contours. Cholecystectomy. No osseous findings to explain chest
pain.
IMPRESSION: 1. No acute finding to explain new chest pain.
2. Near complete clearing of right upper lobe opacity/pneumonia.
Additional radiograph recommended in 3-4 weeks.

## 2016-09-19 IMAGING — CT CT HEAD W/O CM
2 series · 16 of 30 positions shown, 20 images · non-contrast
Comparison: MRI of the brain 09/02/2014

CLINICAL DATA: Left ear pain radiating to the neck.

EXAM:
CT HEAD WITHOUT CONTRAST
TECHNIQUE: Contiguous axial images were obtained from the base of the skull
through the vertex without intravenous contrast.

[Series 201: head w/o, idose (1) · axial · non-contrast · 0.44mm/px · z∈[+118,+238]mm · 13 of 30 slices shown, 17 images]
[im 3/30  brain]
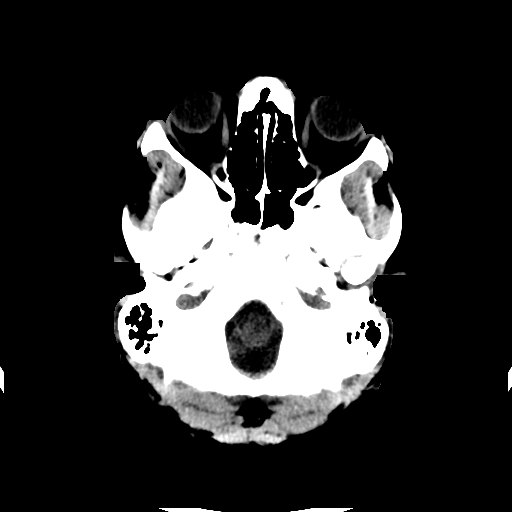
[im 3/30  bone]
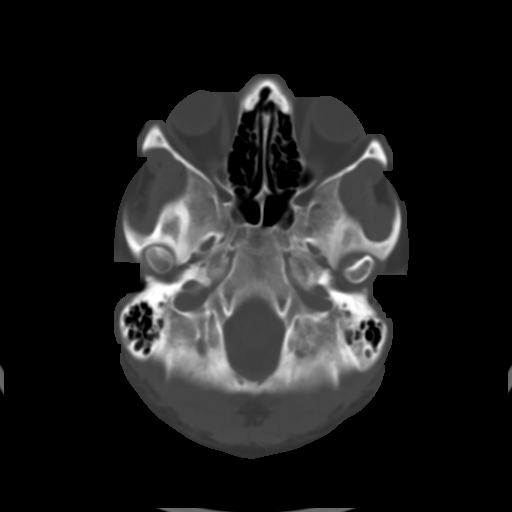
[im 5/30  brain]
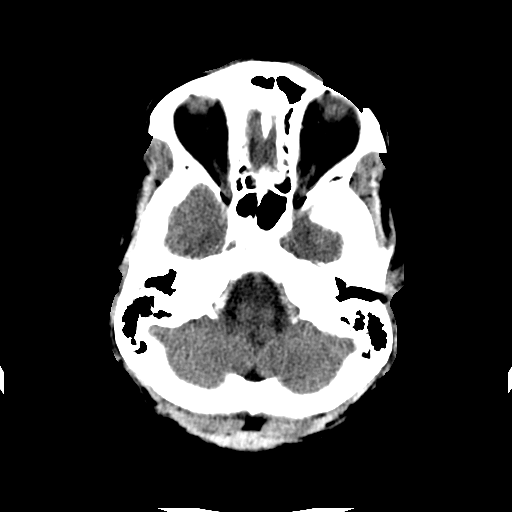
[im 7/30  brain]
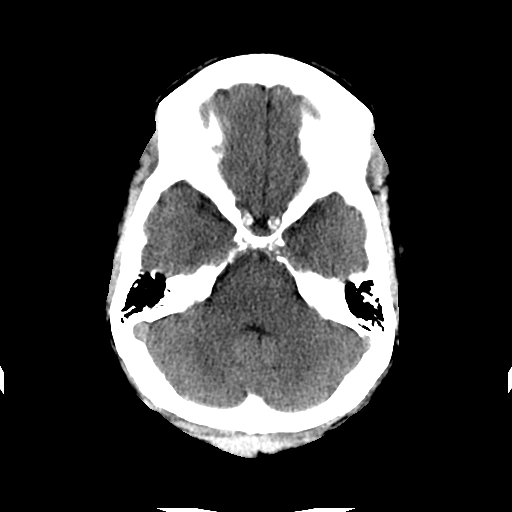
[im 9/30  brain]
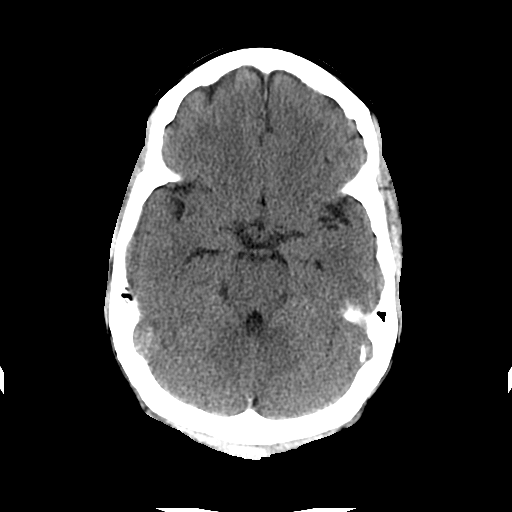
[im 11/30  brain]
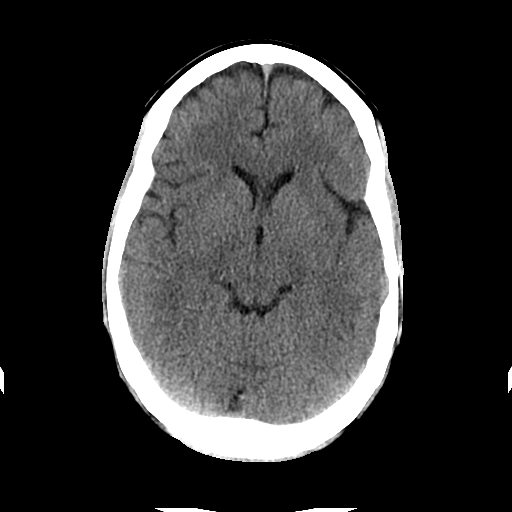
[im 11/30  bone]
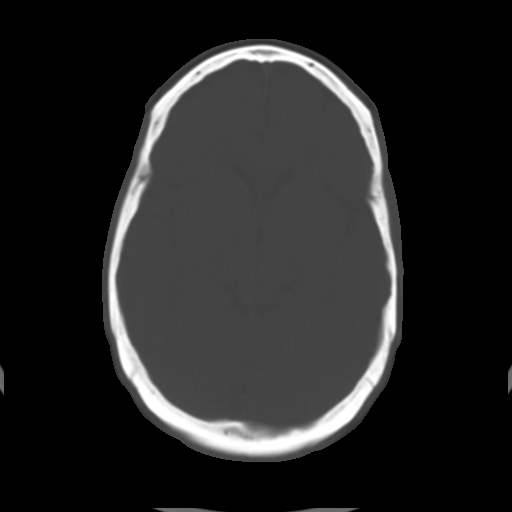
[im 13/30  brain]
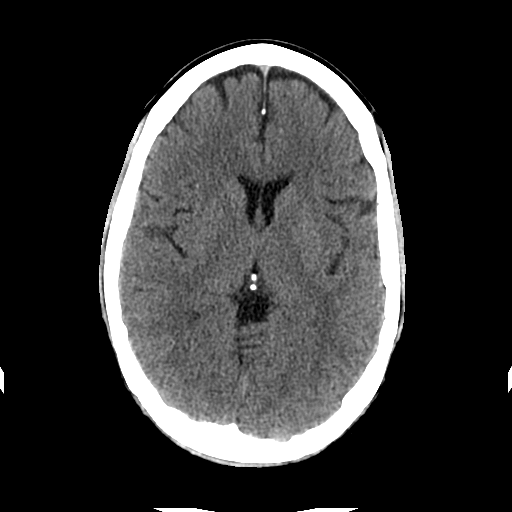
[im 15/30  brain]
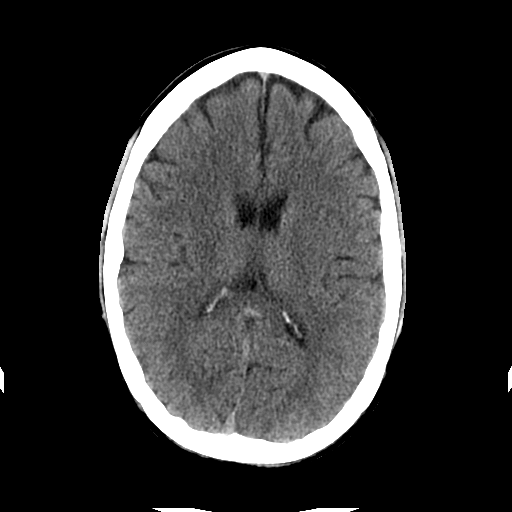
[im 17/30  brain]
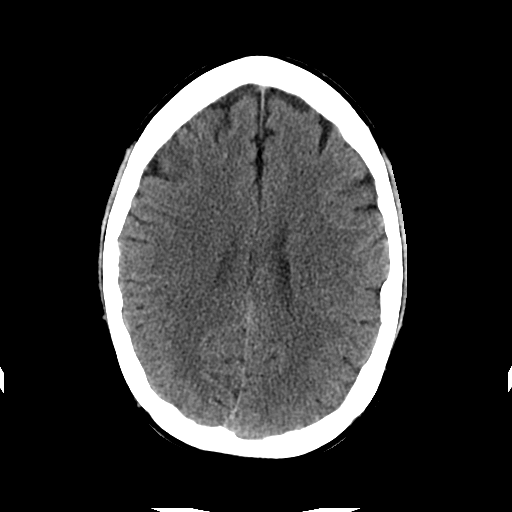
[im 19/30  brain]
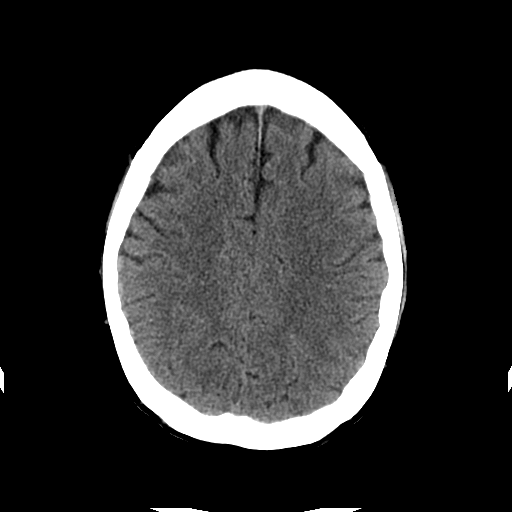
[im 19/30  bone]
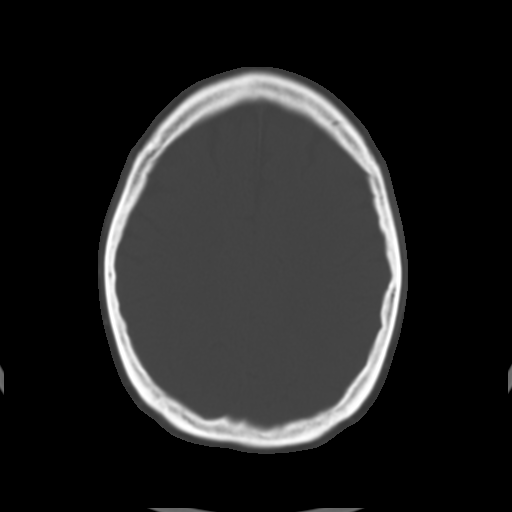
[im 21/30  brain]
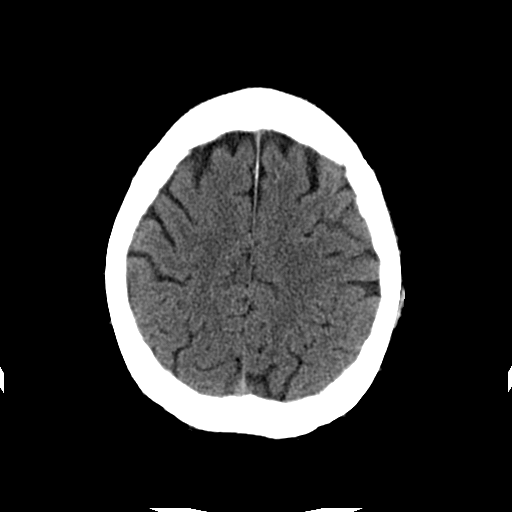
[im 23/30  brain]
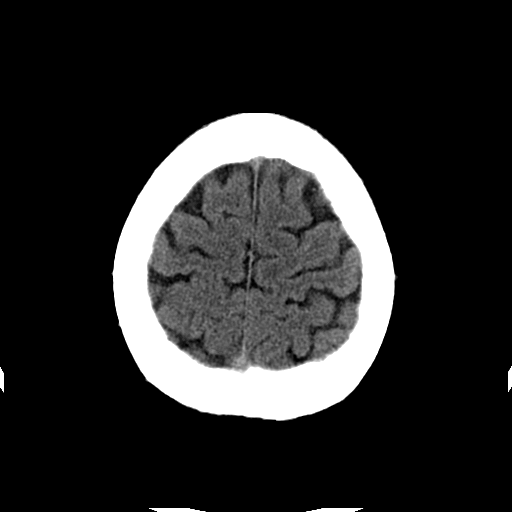
[im 25/30  brain]
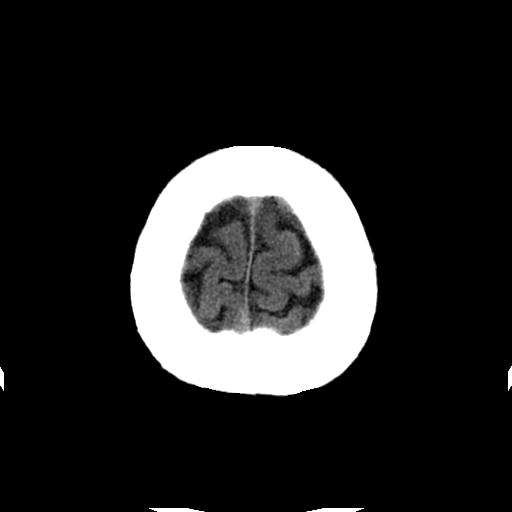
[im 27/30  brain]
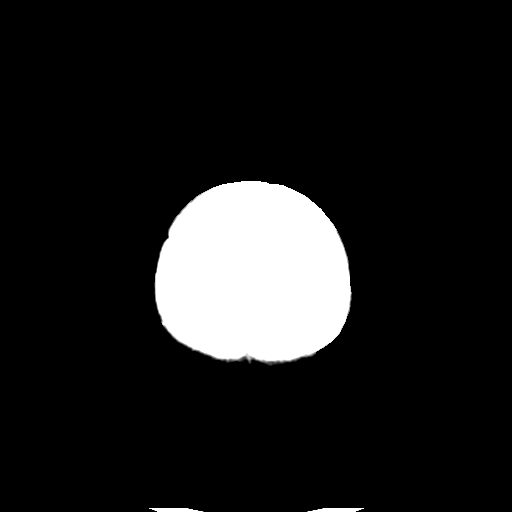
[im 27/30  bone]
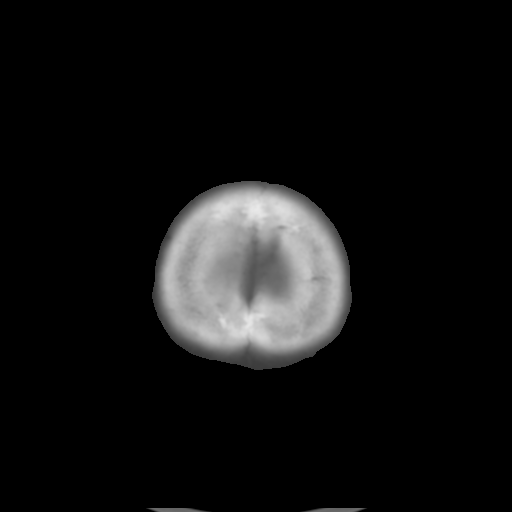

[Series 202: head w/o bone, idose (1) · axial · non-contrast · 0.44mm/px · z∈[+118,+158]mm · 3 of 30 slices shown]
[im 3/30  bone]
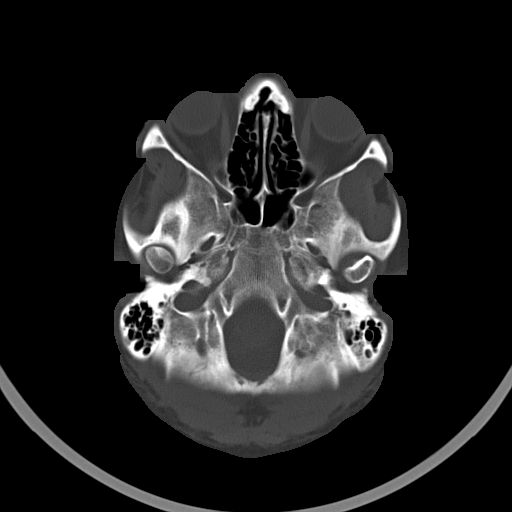
[im 7/30  bone]
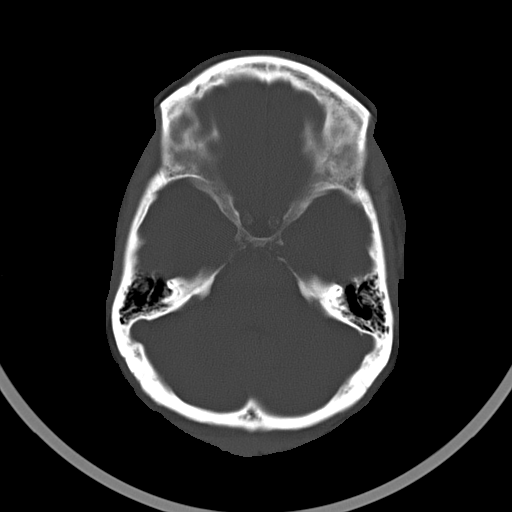
[im 11/30  bone]
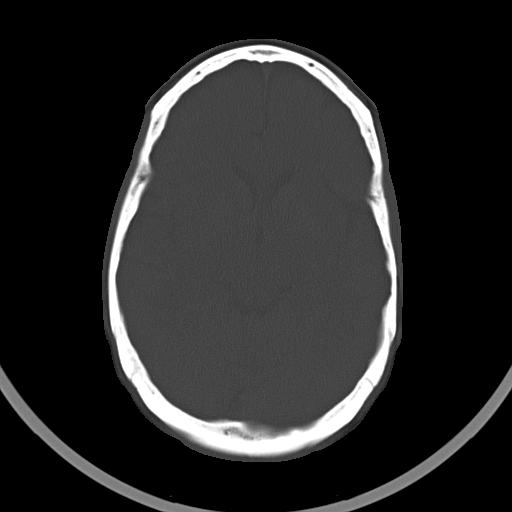

[16 of 30 positions shown; findings below may reference images not displayed]

FINDINGS: Brain: No evidence of acute infarction, hemorrhage, extra-axial
collection, ventriculomegaly, or mass effect.

Vascular: No hyperdense vessel or unexpected calcification.

Skull: Negative for fracture or focal lesion.

Sinuses/Orbits: No acute findings. Mastoid air cells are well
aerated.

Other: There is no fluid within the left ear canal. There is no
significant soft tissue thickening surrounding the left external
ear.
IMPRESSION: No acute intracranial abnormality.

Normal CT appearance of the left ear.

## 2016-10-12 ENCOUNTER — Ambulatory Visit (HOSPITAL_BASED_OUTPATIENT_CLINIC_OR_DEPARTMENT_OTHER)
Admission: RE | Admit: 2016-10-12 | Discharge: 2016-10-12 | Disposition: A | Discharge status: Home / Self Care | Payer: Medicare PPO | Source: Ambulatory Visit | Attending: Psychiatry | Admitting: Psychiatry

## 2016-10-12 ENCOUNTER — Other Ambulatory Visit (HOSPITAL_BASED_OUTPATIENT_CLINIC_OR_DEPARTMENT_OTHER): Payer: Self-pay | Admitting: Psychiatry

## 2016-10-12 DIAGNOSIS — M545 Low back pain: Secondary | ICD-10-CM

## 2016-11-02 ENCOUNTER — Other Ambulatory Visit: Payer: Self-pay | Admitting: *Deleted

## 2016-11-19 IMAGING — CR DG ABDOMEN 2V
2 series · 2 of 2 positions shown · non-contrast
Comparison: 09/03/2014

CLINICAL DATA: Abdominal pain for 2 days

EXAM:
ABDOMEN - 2 VIEW

[AP (1 of 2)]
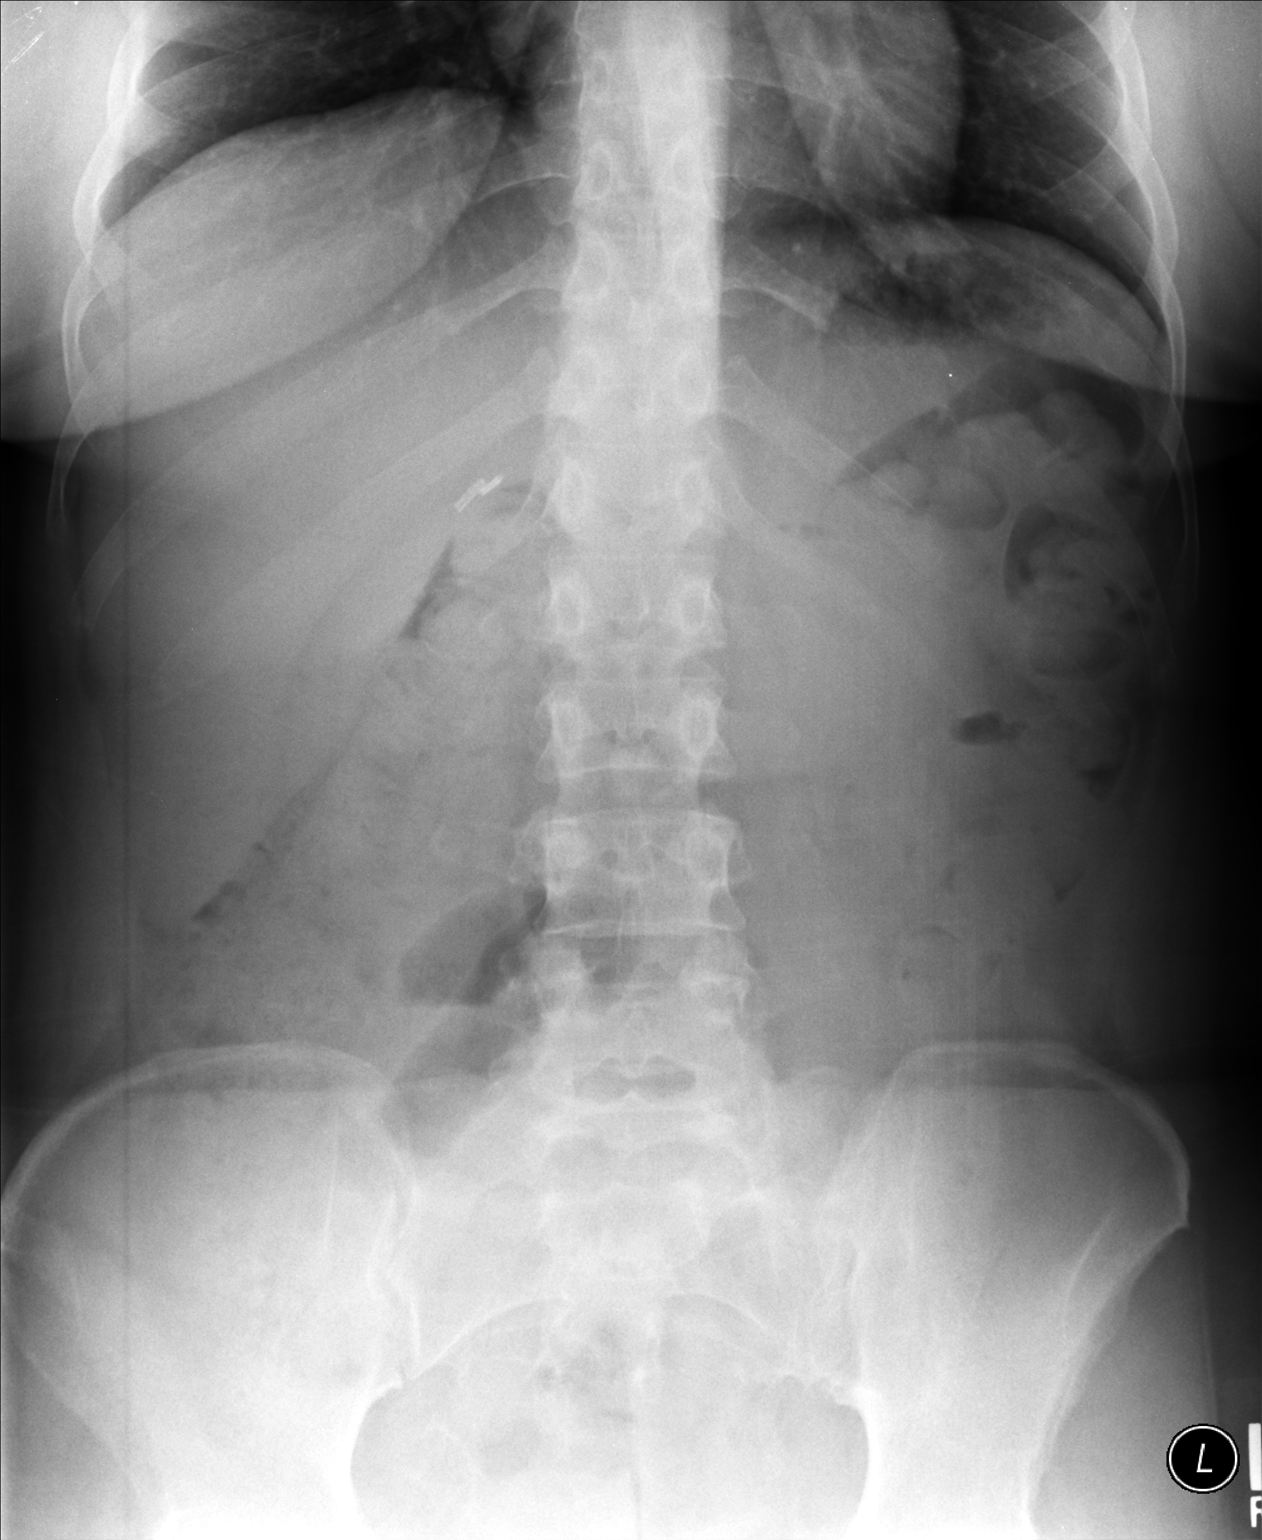

[AP (2 of 2)]
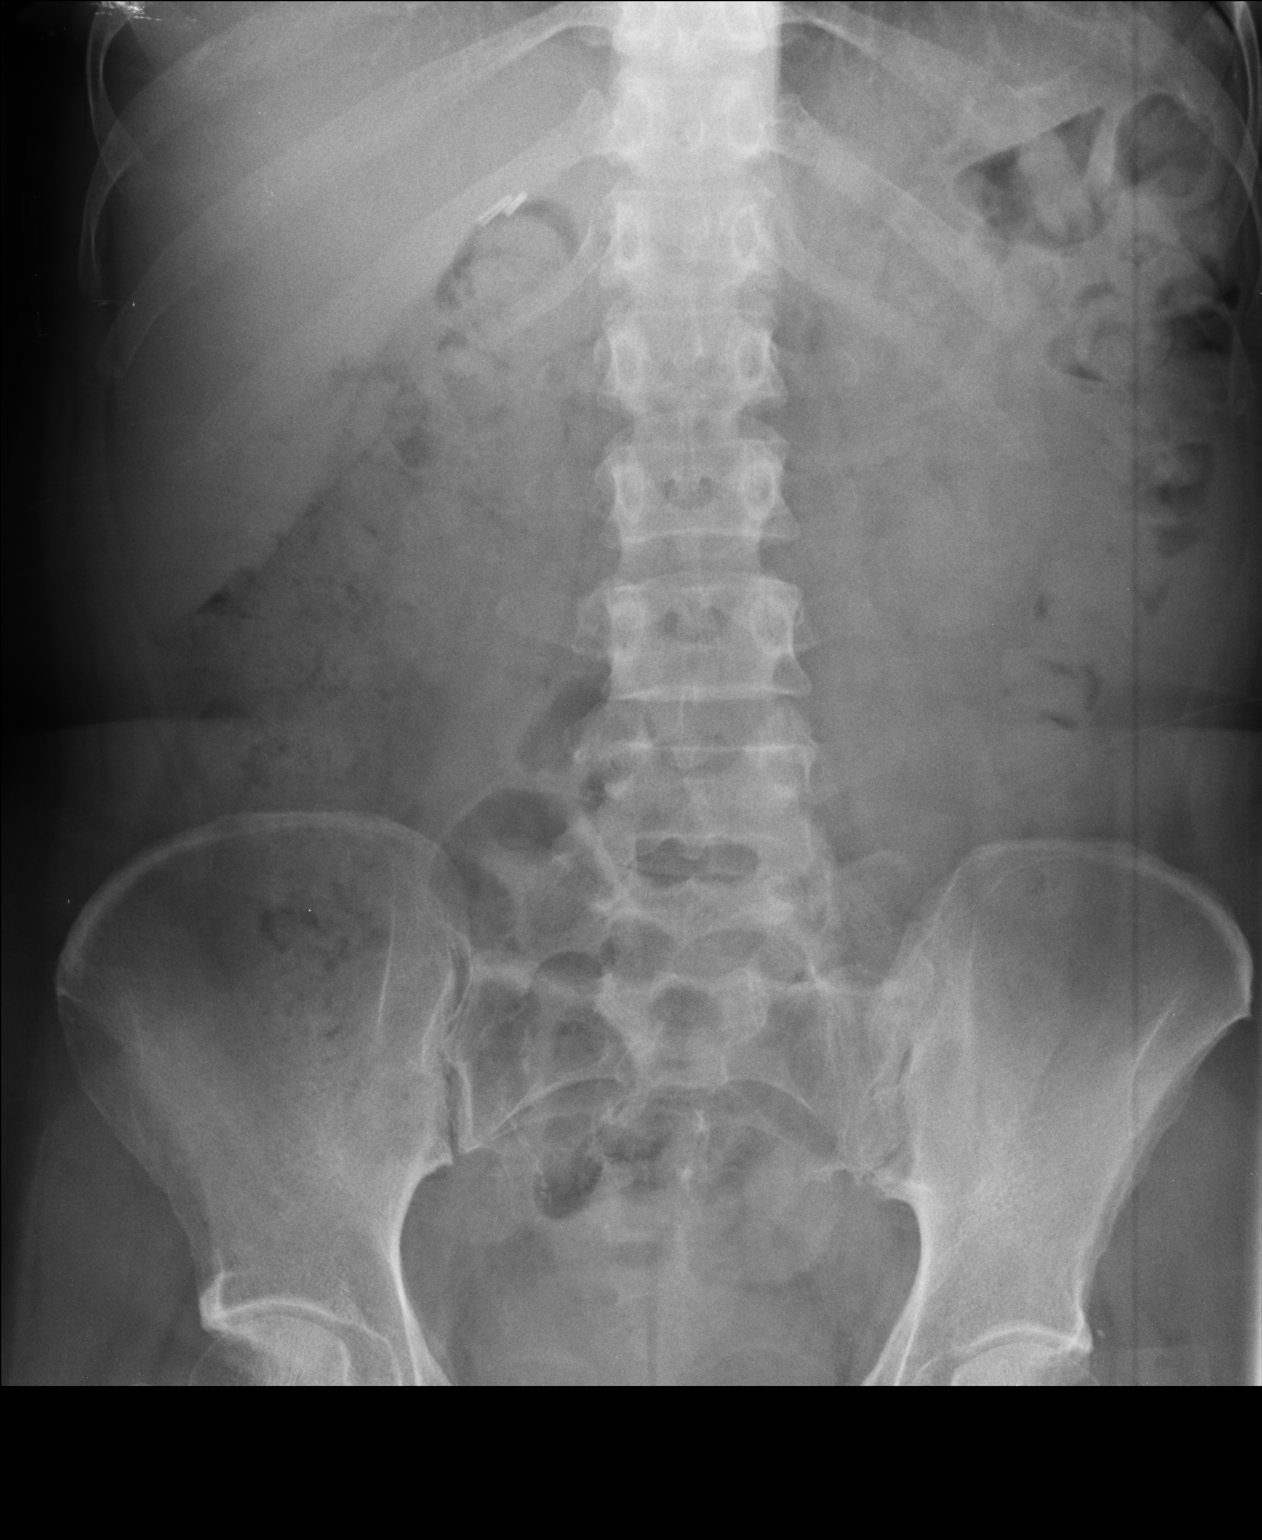

[2 of 2 positions shown; findings below may reference images not displayed]

FINDINGS: Scattered large and small bowel gas is noted. A considerable amount
of fecal material is noted within the colon consistent with
constipation. No obstructive changes are seen. No free air is noted.
No bony abnormality is noted.
IMPRESSION: Changes consistent with constipation. No other focal abnormality is
seen.

## 2016-11-20 IMAGING — CR DG CHEST 2V
2 series · 2 of 2 positions shown · non-contrast
Comparison: Radiograph dated 10/18/2014

CLINICAL DATA: 44-year-old female with shortness of breath and
chest pain

EXAM:
CHEST  2 VIEW

[chest pa]
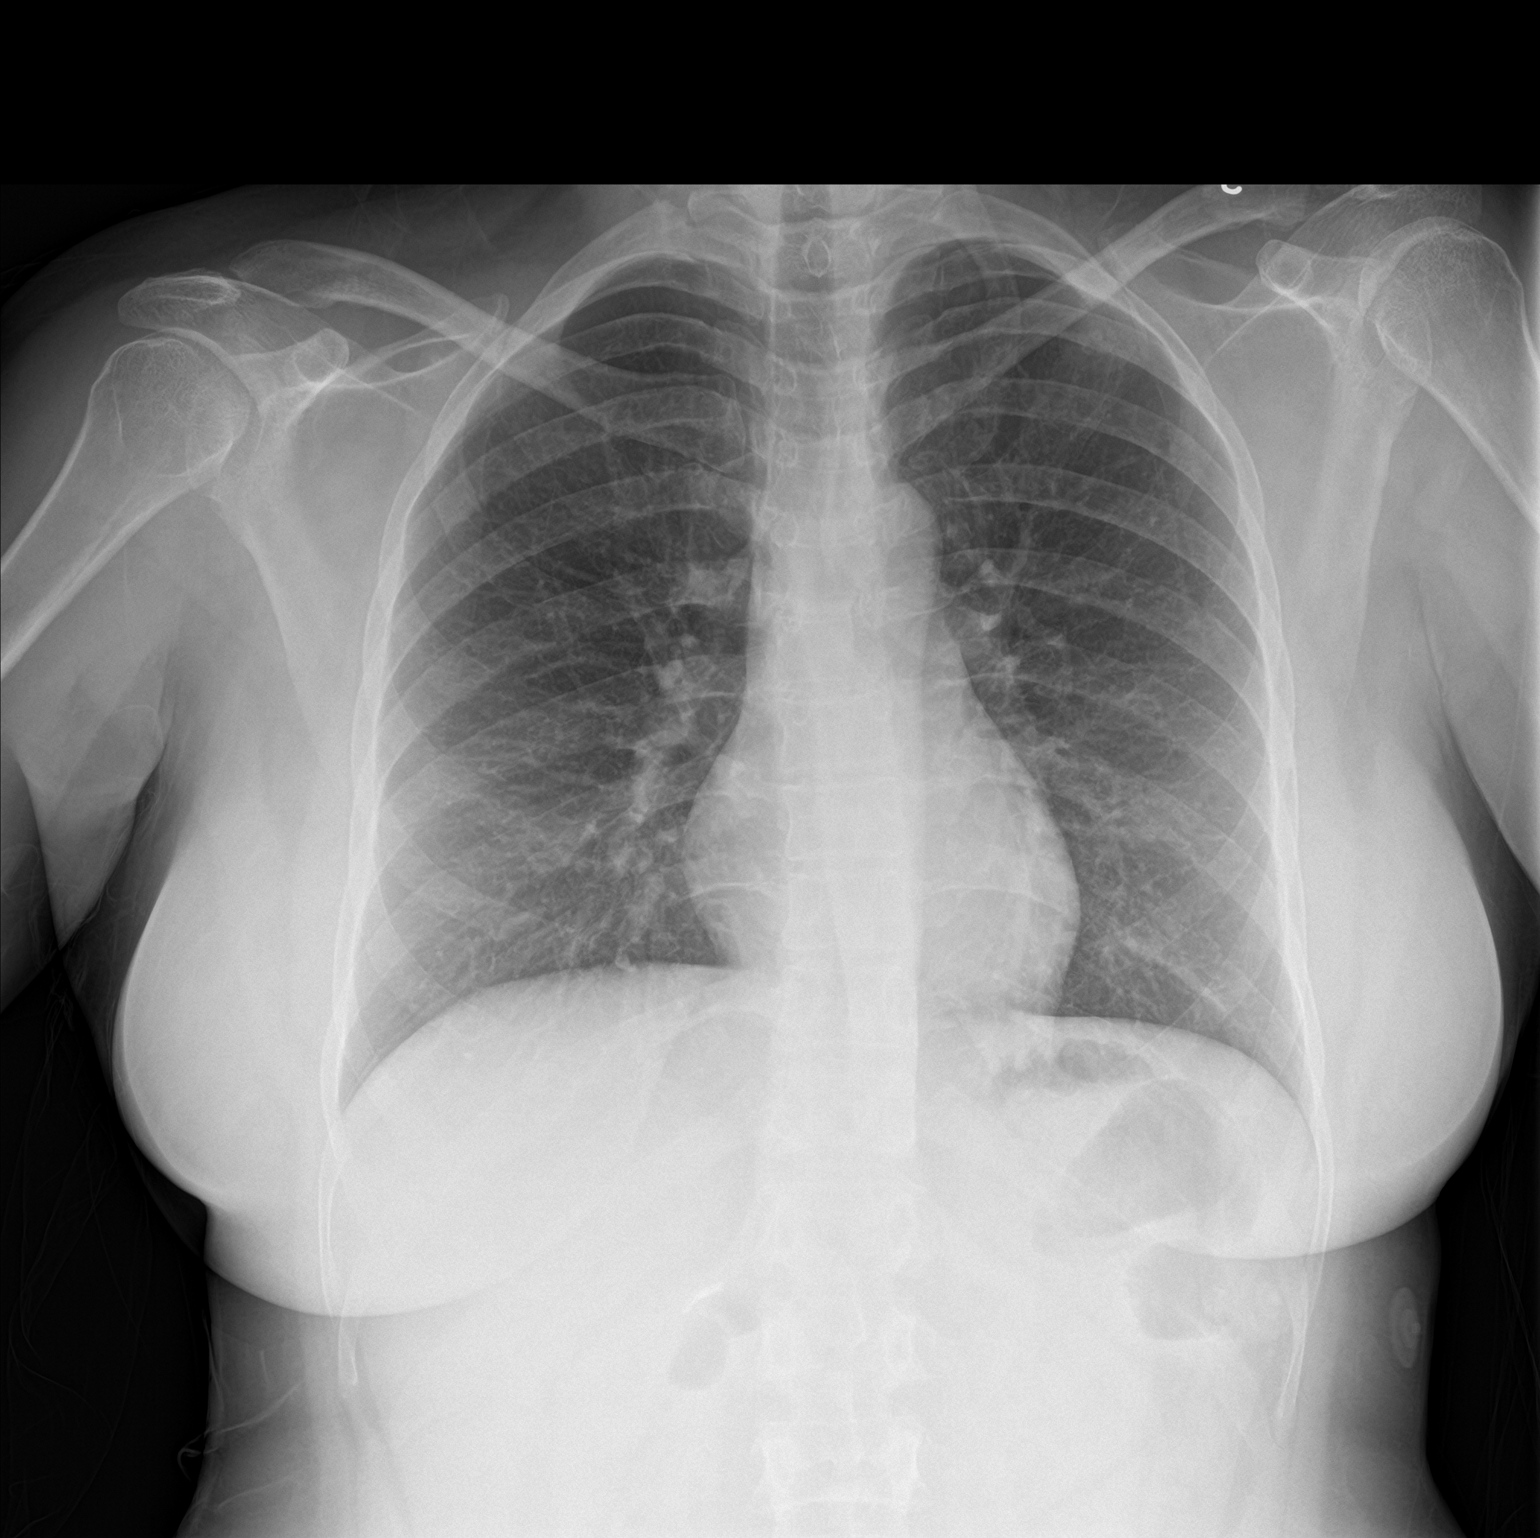

[chest lat]
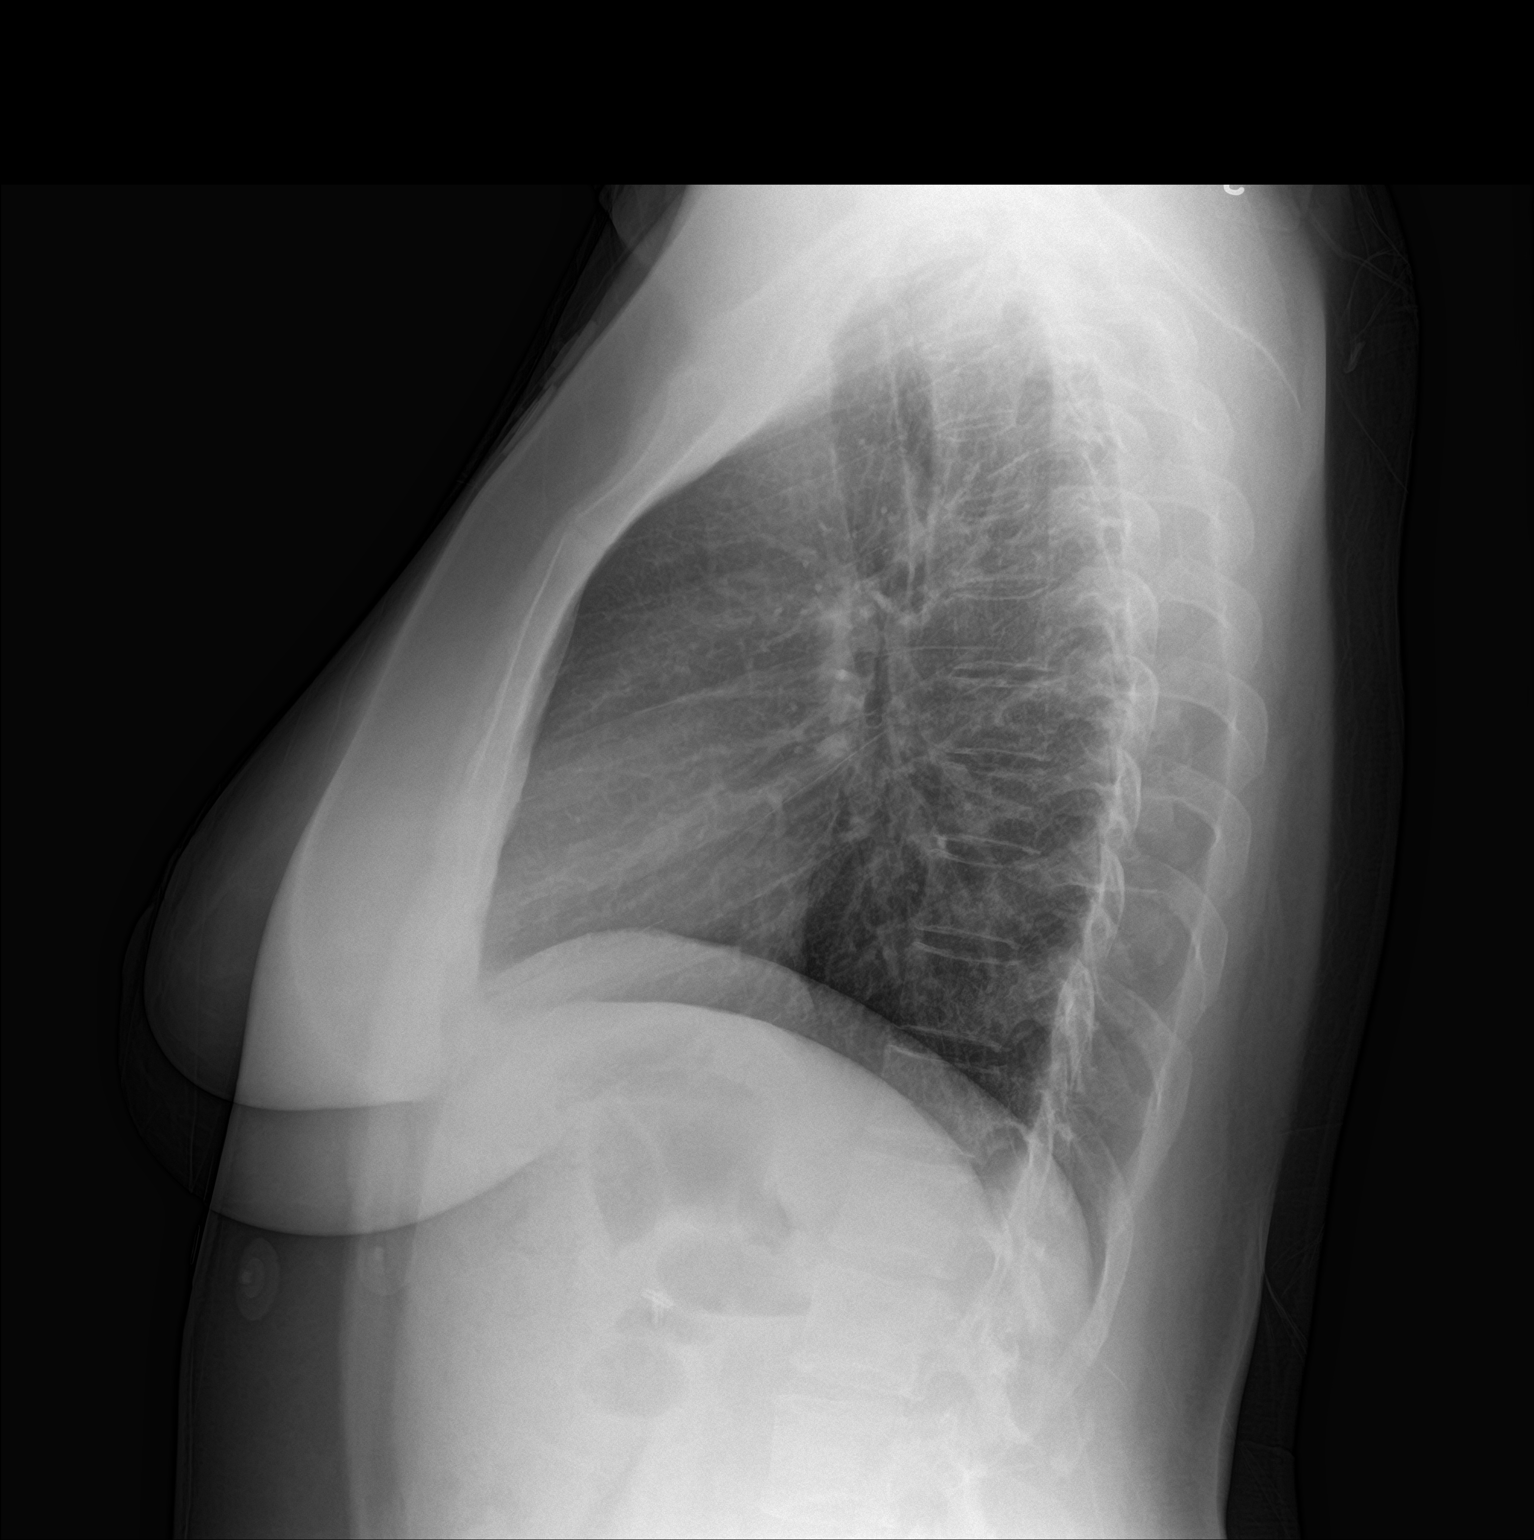

[2 of 2 positions shown; findings below may reference images not displayed]

FINDINGS: The heart size and mediastinal contours are within normal limits.
Both lungs are clear. The visualized skeletal structures are
unremarkable.
IMPRESSION: No active cardiopulmonary disease.

## 2016-11-21 IMAGING — CT CT ANGIO CHEST
2 of 6 series · 19 of 36 positions shown · IV contrast (omnipaque)
Comparison: Radiograph dated 01/18/2015

CLINICAL DATA: 44-year-old female with mid chest pain

EXAM:
CT ANGIOGRAPHY CHEST WITH CONTRAST
TECHNIQUE: Multidetector CT imaging of the chest was performed using the
standard protocol during bolus administration of intravenous
contrast. Multiplanar CT image reconstructions and MIPs were
obtained to evaluate the vascular anatomy.
CONTRAST:  100mL OMNIPAQUE IOHEXOL 350 MG/ML SOLN

[Series 6: pe thins · axial · 0.59mm/px · z∈[-624,-387]mm · 18 of 527 slices shown]
[im 26/527  lung]
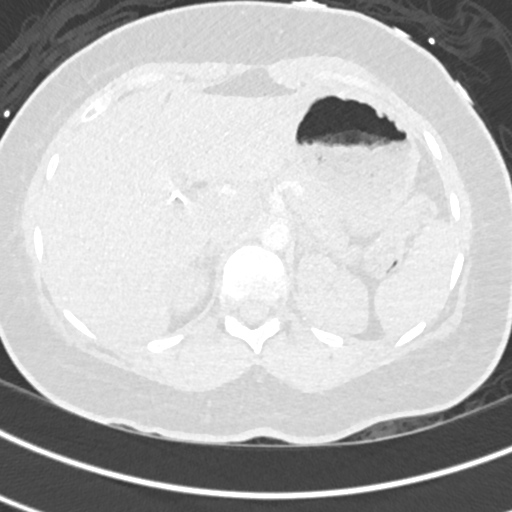
[im 51/527  mediastinal]
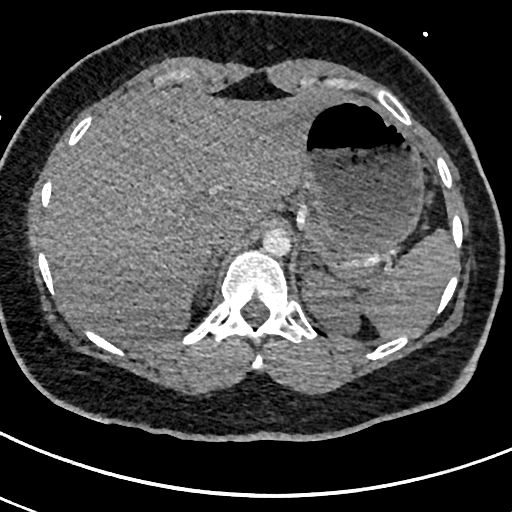
[im 76/527  lung]
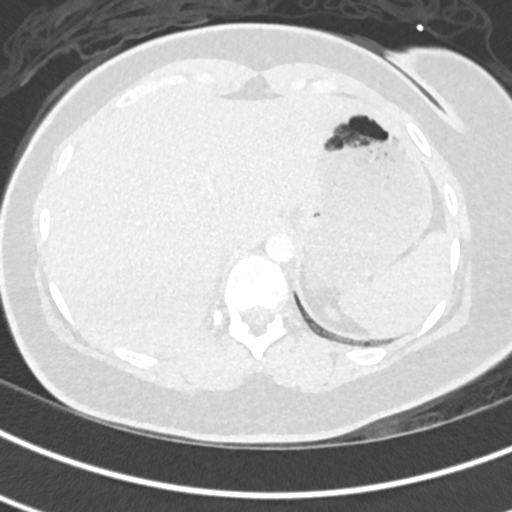
[im 101/527  mediastinal]
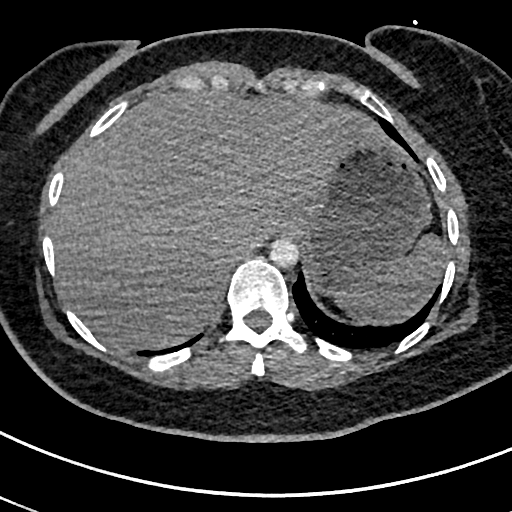
[im 151/527  lung]
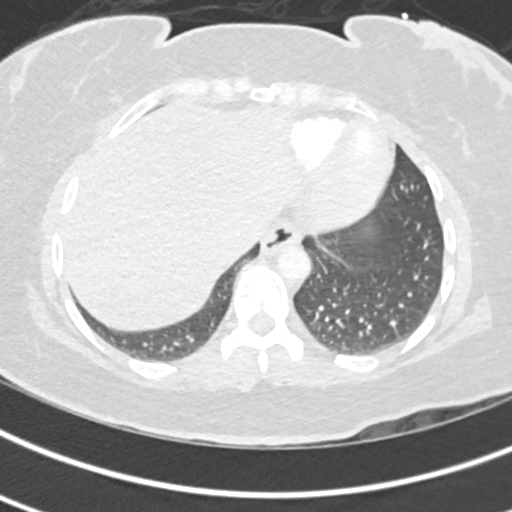
[im 176/527  mediastinal]
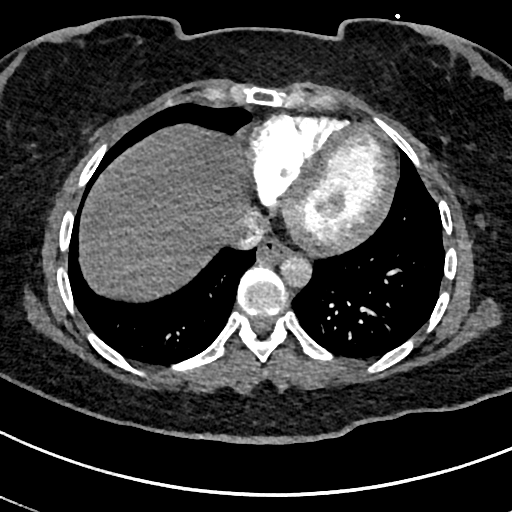
[im 201/527  lung]
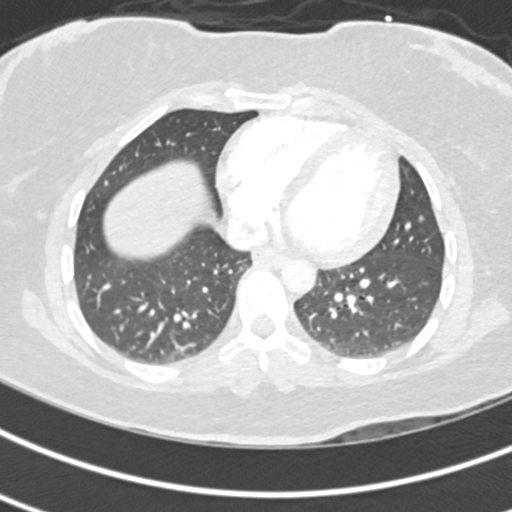
[im 226/527  mediastinal]
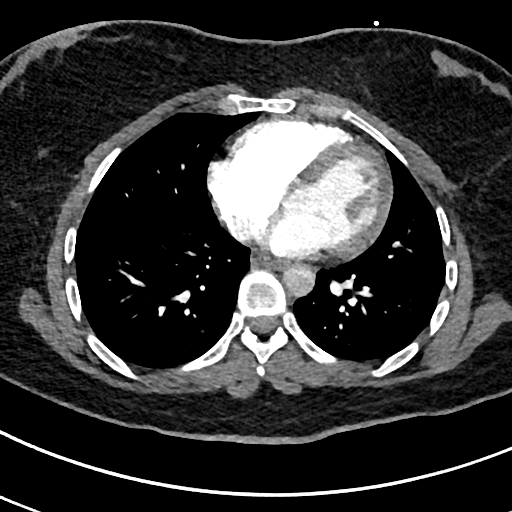
[im 251/527  lung]
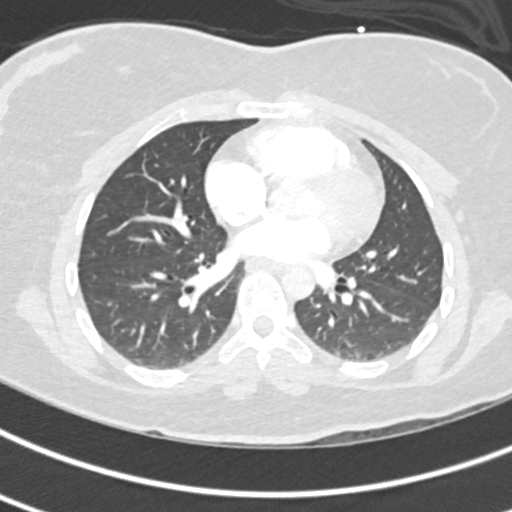
[im 276/527  mediastinal]
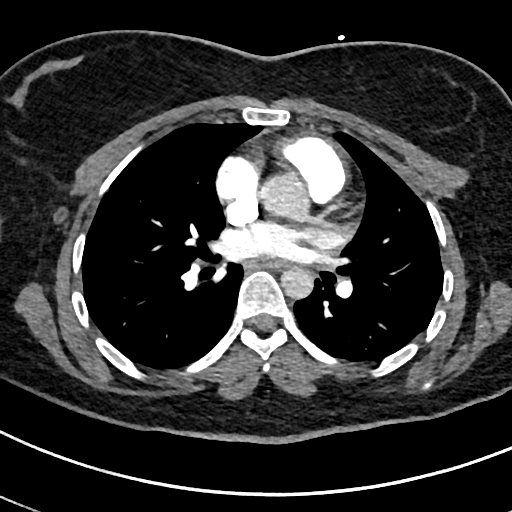
[im 301/527  lung]
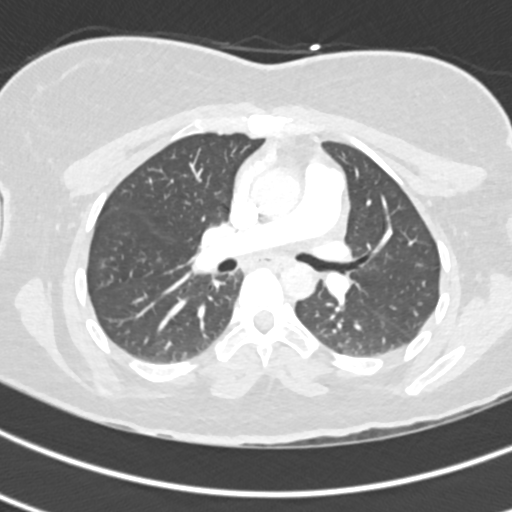
[im 326/527  mediastinal]
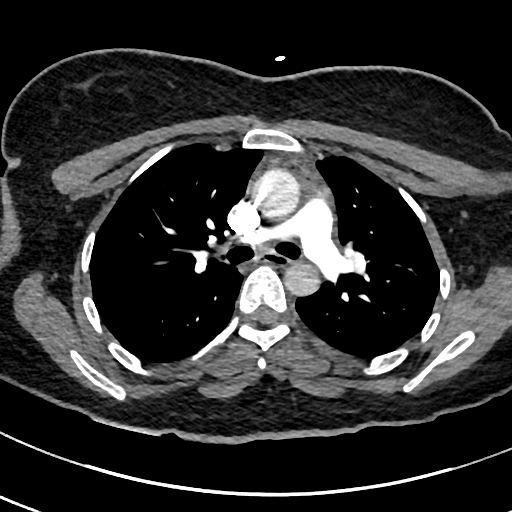
[im 351/527  lung]
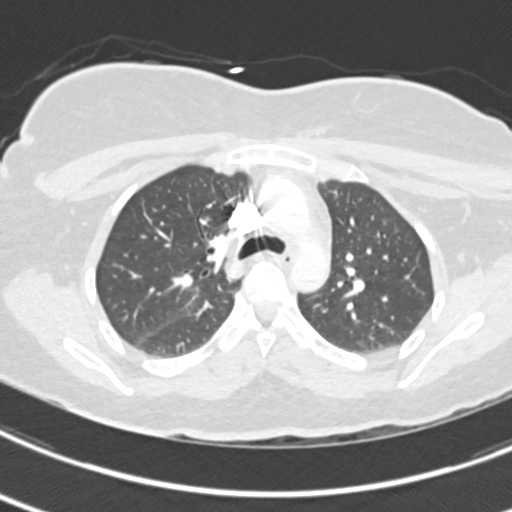
[im 401/527  mediastinal]
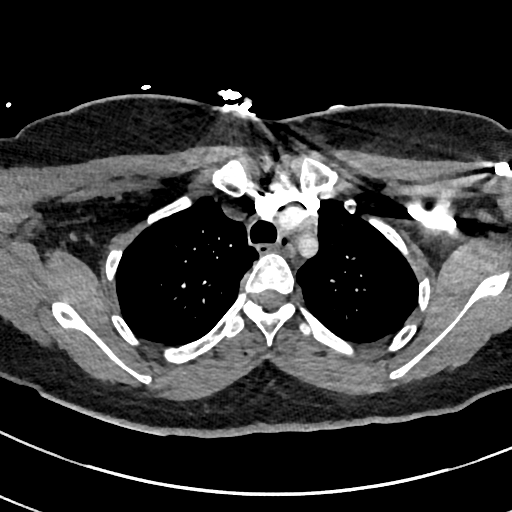
[im 426/527  lung]
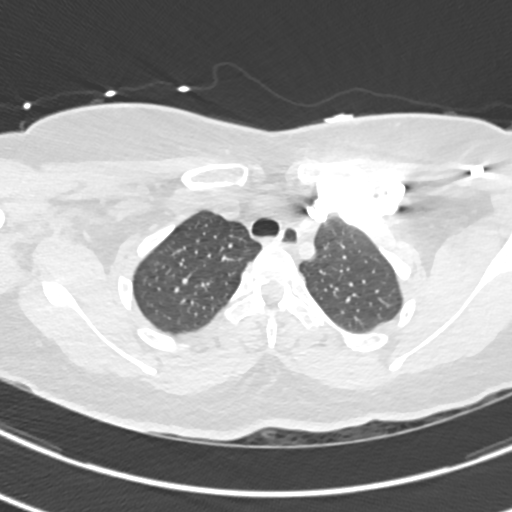
[im 451/527  mediastinal]
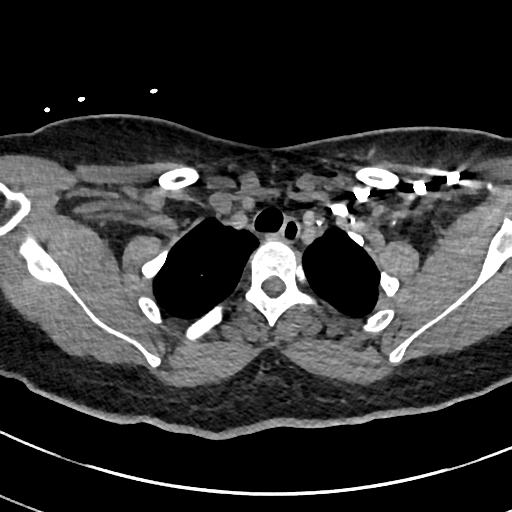
[im 476/527  lung]
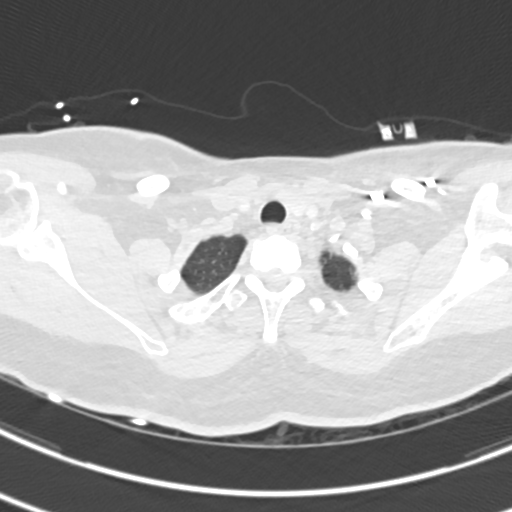
[im 501/527  mediastinal]
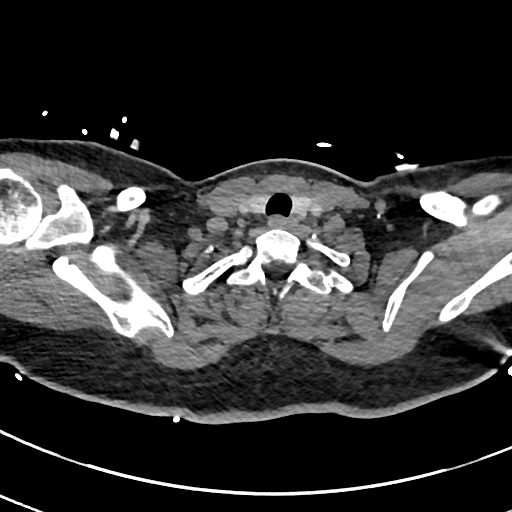

[Series 7: pe 2mm cor · coronal · 0.48mm/px · 1 of 106 slices shown]
[im 53/106  mediastinal]
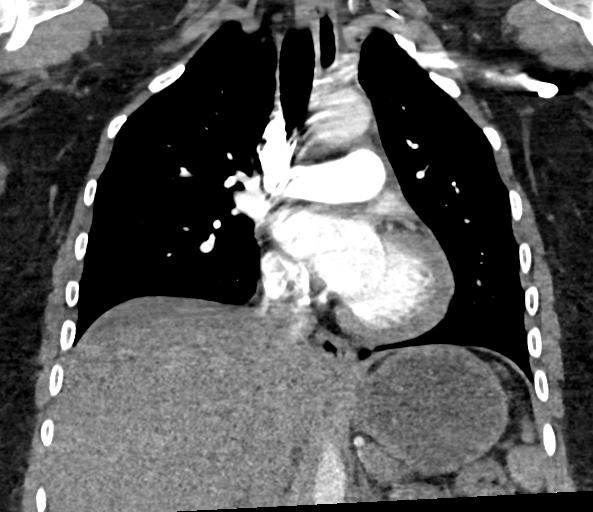

[19 of 36 positions shown; findings below may reference images not displayed]

FINDINGS: There are two pulmonary nodules in the right middle lobe each
measuring approximately 3 mm. There is no focal consolidation,
pleural effusion, or pneumothorax. The central airways are patent.

The thoracic aorta appears unremarkable. No CT evidence of pulmonary
embolism. There is no cardiomegaly or pericardial effusion. No hilar
or mediastinal adenopathy.

The esophagus and thyroid gland appear grossly unremarkable.

There is no axillary adenopathy. The chest wall soft tissues appear
unremarkable. The osseous structures are intact.

Fatty infiltration of the liver. Cholecystectomy. The visualized
upper abdomen is otherwise unremarkable.

Review of the MIP images confirms the above findings.
IMPRESSION: No CT evidence of pulmonary embolism.

## 2016-11-27 IMAGING — US US ABDOMEN LIMITED
1 series · 14 of 25 positions shown · non-contrast
Comparison: Abdominal CT from 1 month ago

CLINICAL DATA: Right upper quadrant pain for 2 days with
transaminitis

EXAM:
US ABDOMEN LIMITED - RIGHT UPPER QUADRANT

[Series 1: us abdomen limited · 0.24mm/px · 14 of 46 slices shown]
[im 1/46]
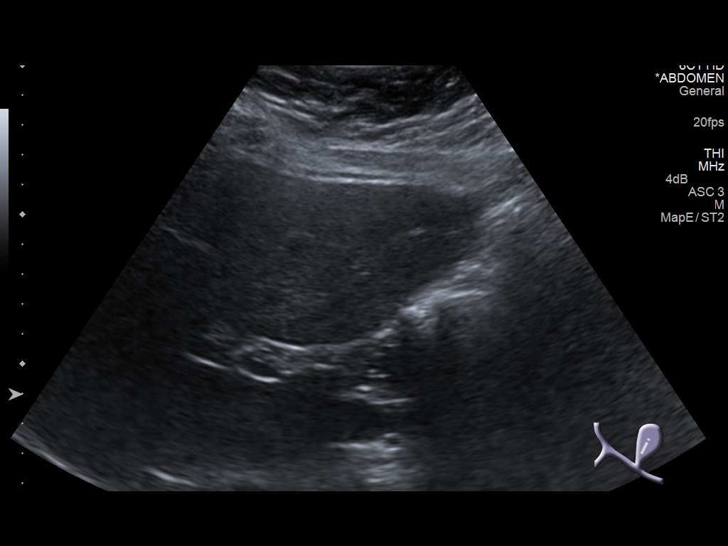
[im 4/46]
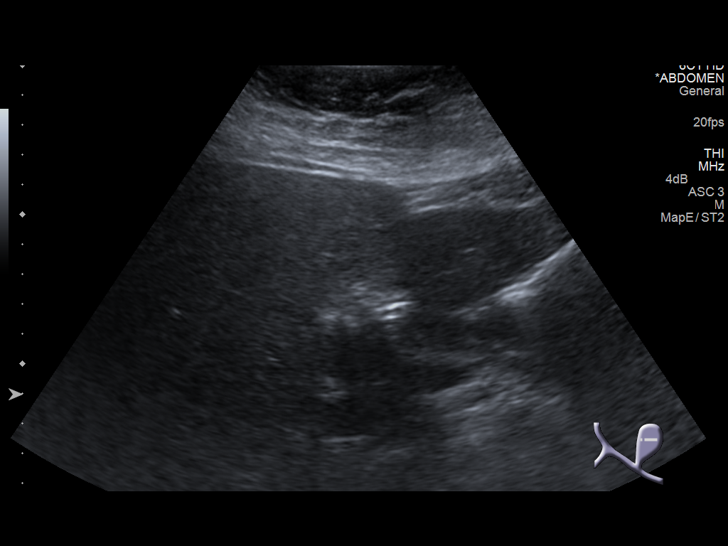
[im 8/46]
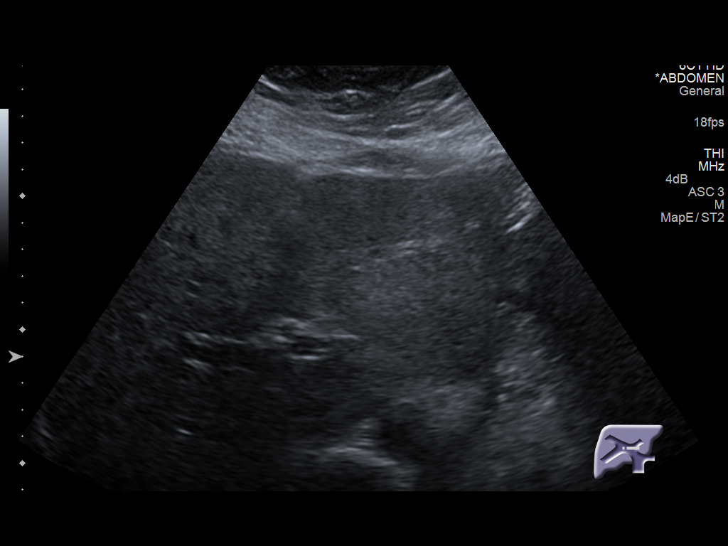
[im 12/46]
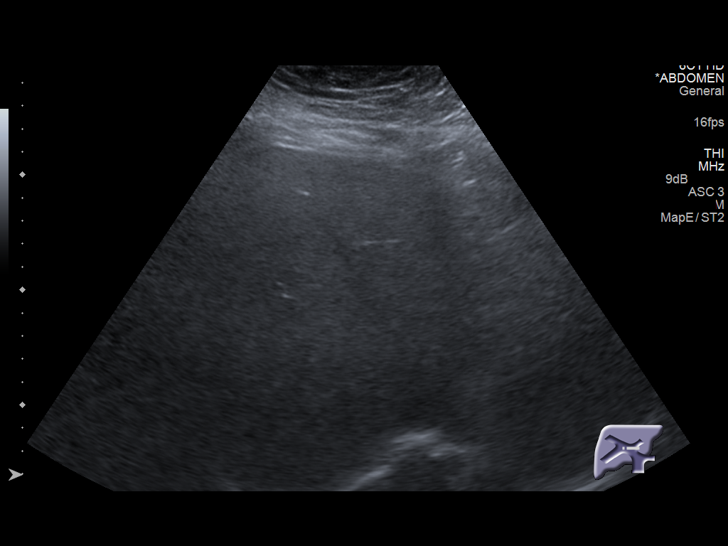
[im 16/46]
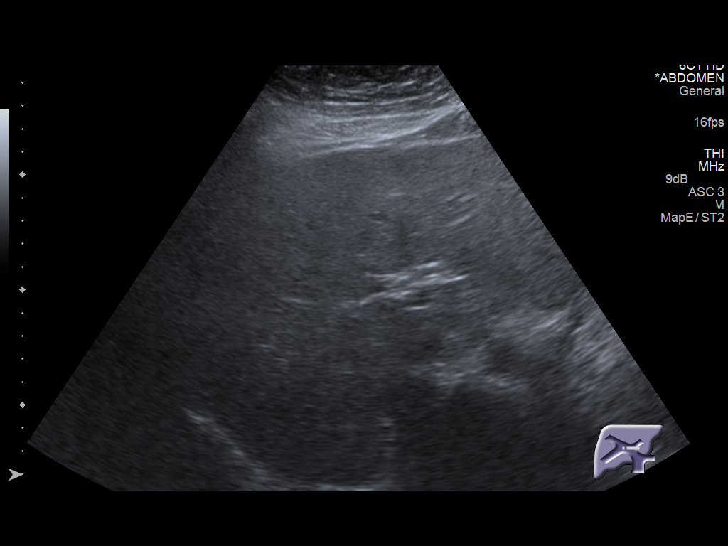
[im 17/46]
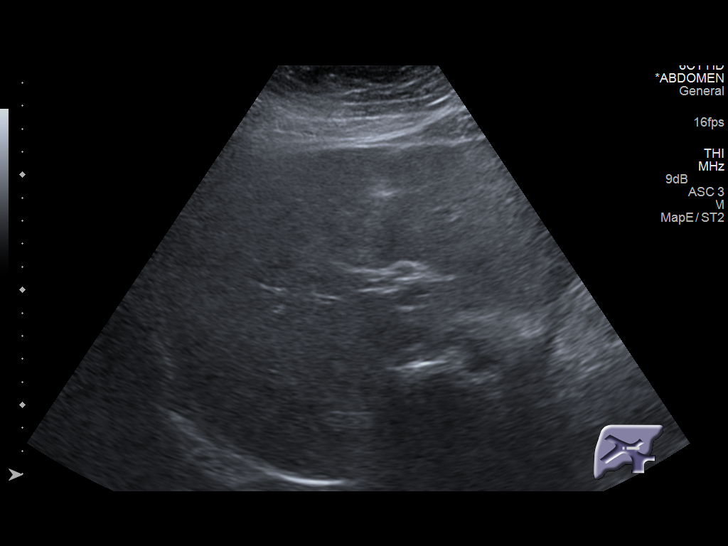
[im 21/46]
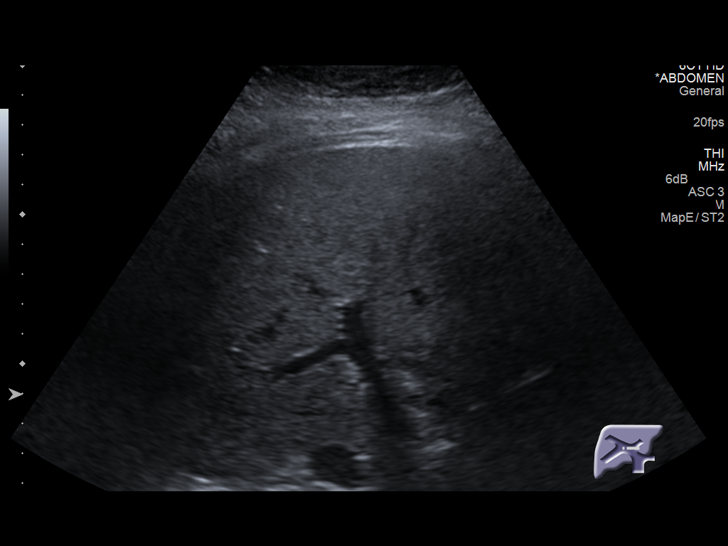
[im 25/46]
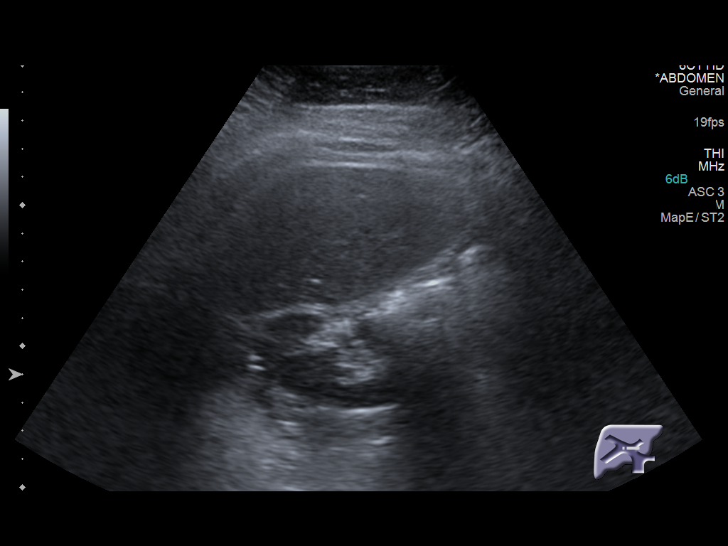
[im 29/46]
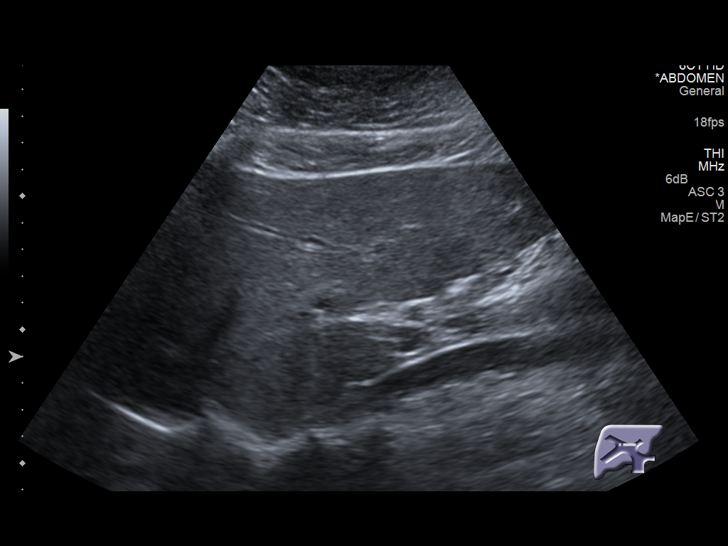
[im 31/46]
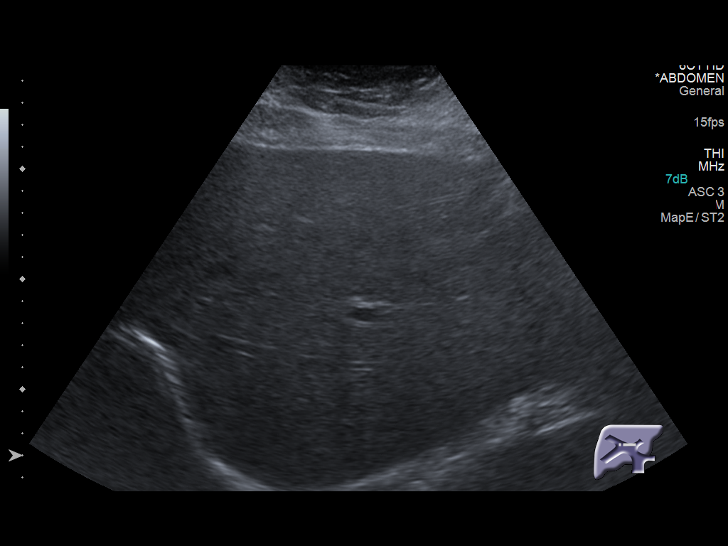
[im 34/46]
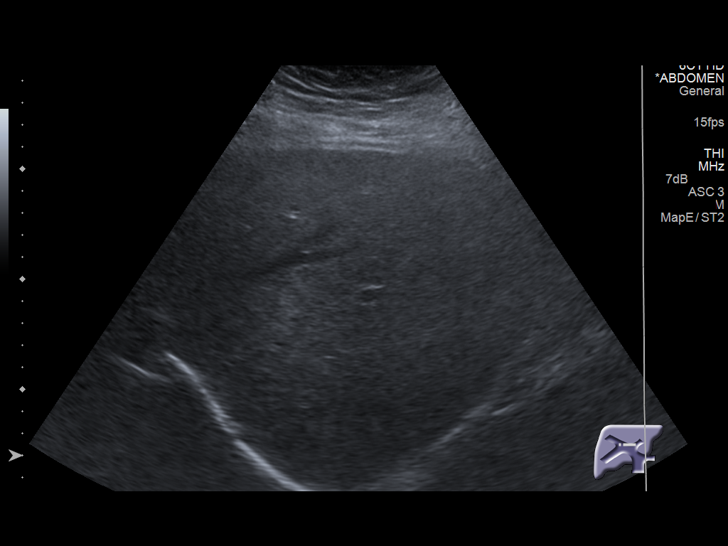
[im 38/46]
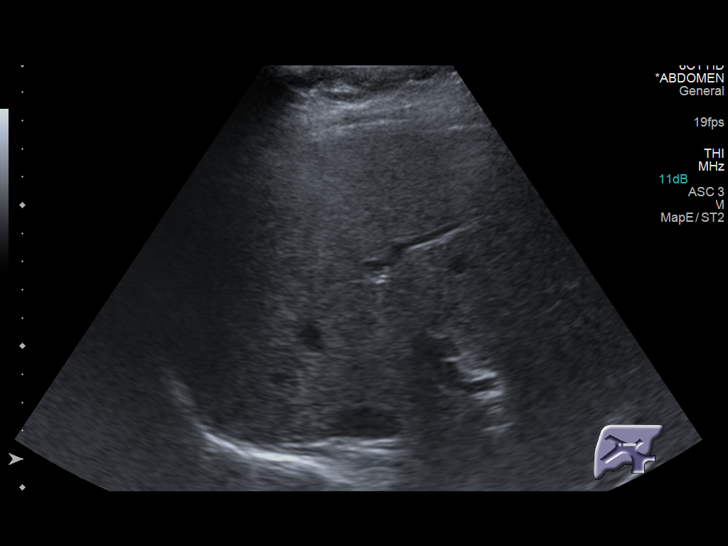
[im 42/46]
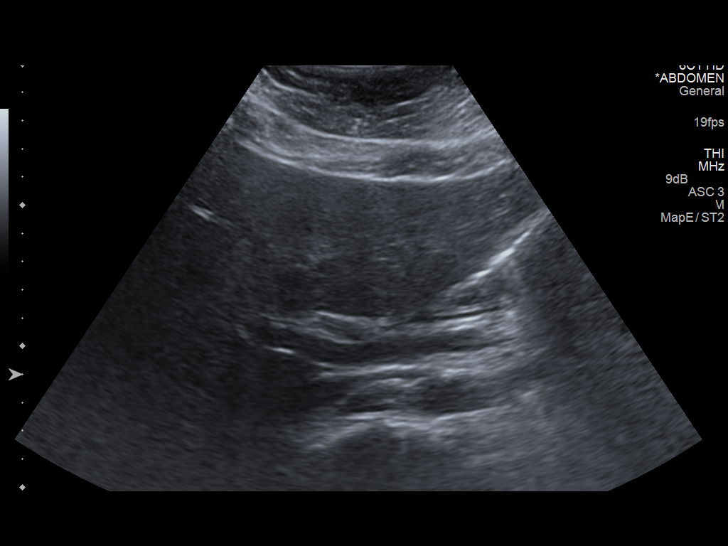
[im 46/46]
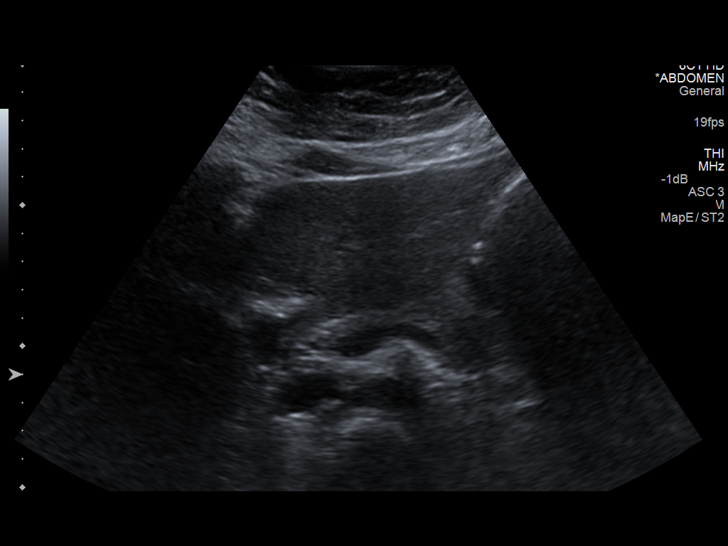

[14 of 25 positions shown; findings below may reference images not displayed]

FINDINGS: Gallbladder:

Cholecystectomy.  No gallbladder fossa fluid collection.

Common bile duct:

Diameter: 4 mm.

Liver:

No focal lesion identified. Within normal limits in parenchymal
echogenicity.

Fluid noted around the pancreatic head is ovoid and consistent with
second portion duodenum. No definitive changes of pancreatitis.
IMPRESSION: Negative right upper quadrant ultrasound after cholecystectomy.

## 2016-12-22 IMAGING — CT CT ABD-PELV W/ CM
2 of 5 series · 16 of 46 positions shown, 18 images · IV contrast (Omni 300)
Comparison: Renal ultrasound earlier this day. Most recent CT
09/03/2014

CLINICAL DATA: Left flank pain, onset this morning.  Nausea.

EXAM:
CT ABDOMEN AND PELVIS WITH CONTRAST
TECHNIQUE: Multidetector CT imaging of the abdomen and pelvis was performed
using the standard protocol following bolus administration of
intravenous contrast.
CONTRAST:  100mL OMNIPAQUE IOHEXOL 300 MG/ML  SOLN

[Series 2: a/p w/ 5mm · axial · 0.72mm/px · z∈[+329,+764]mm · 13 of 99 slices shown, 15 images]
[im 6/99  soft-tissue]
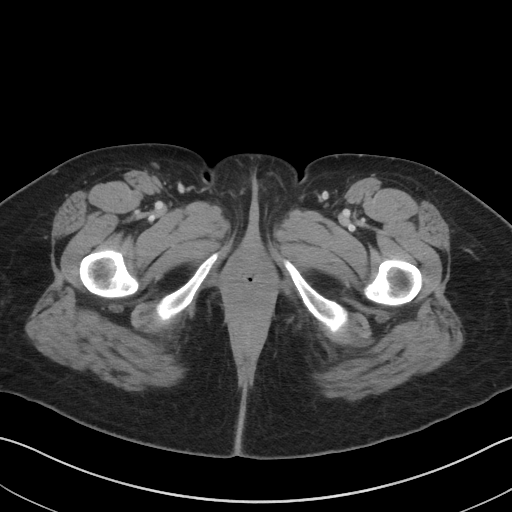
[im 6/99  bone]
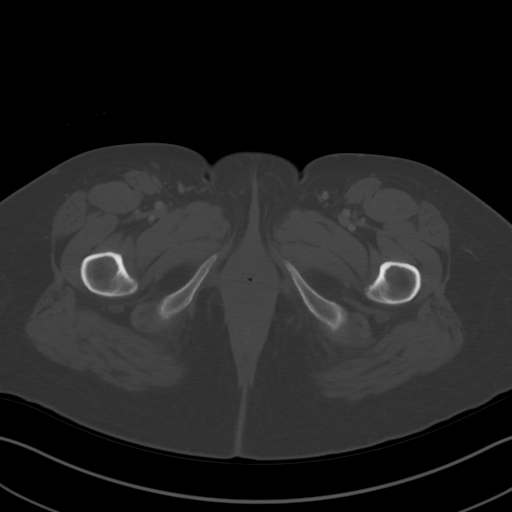
[im 11/99  soft-tissue]
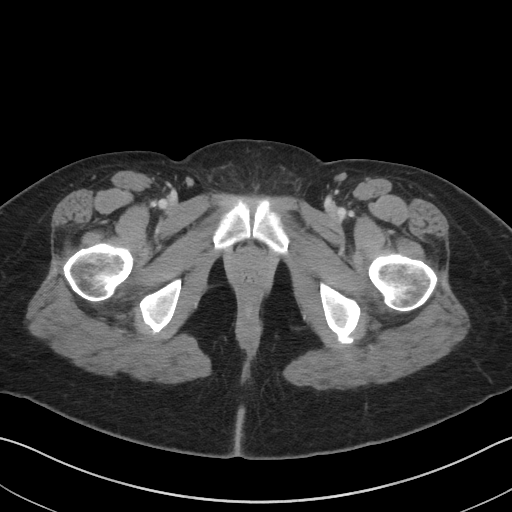
[im 22/99  soft-tissue]
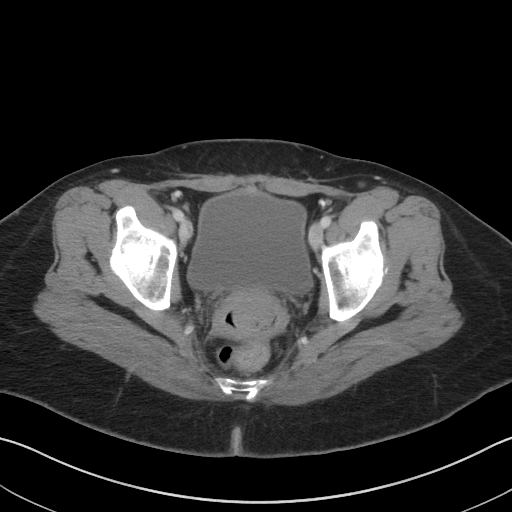
[im 28/99  soft-tissue]
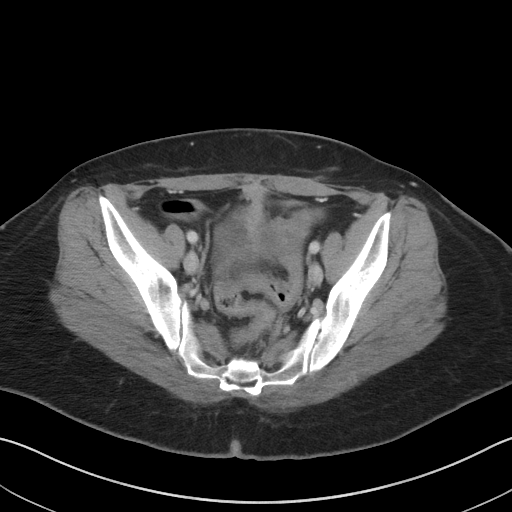
[im 33/99  soft-tissue]
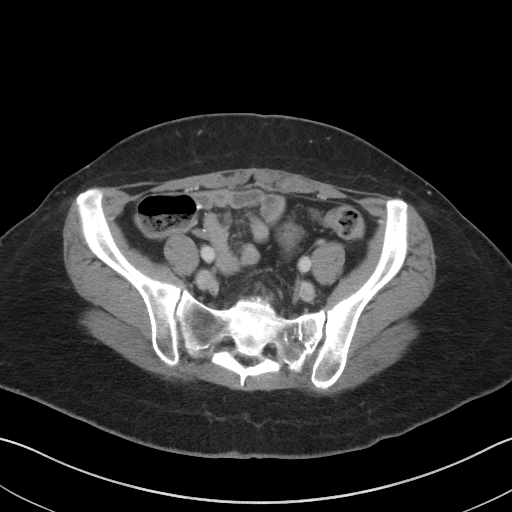
[im 44/99  soft-tissue]
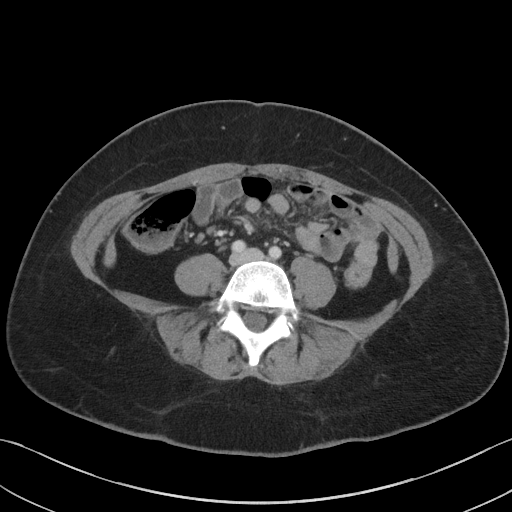
[im 50/99  soft-tissue]
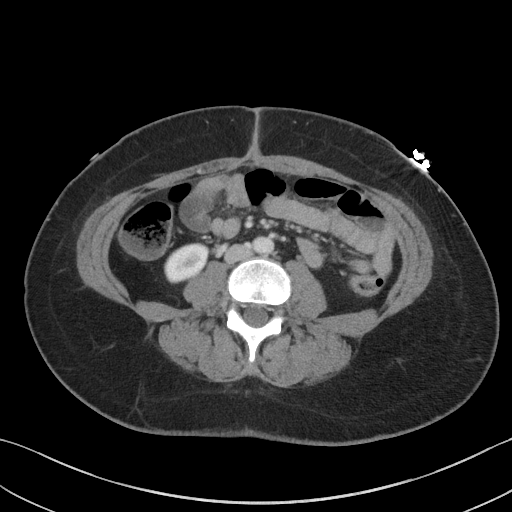
[im 55/99  soft-tissue]
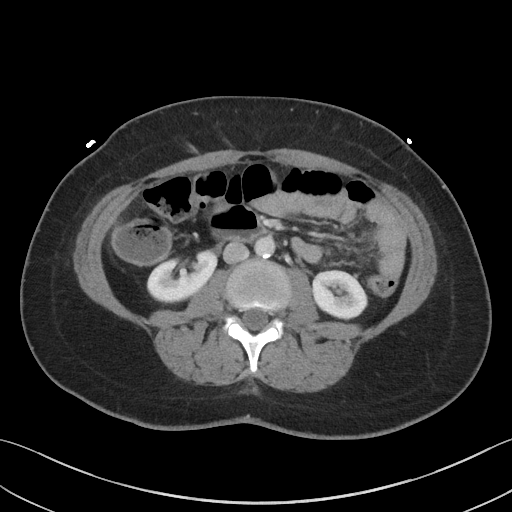
[im 66/99  soft-tissue]
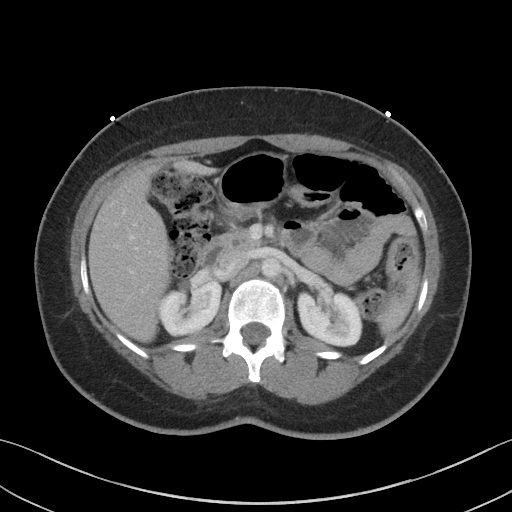
[im 66/99  bone]
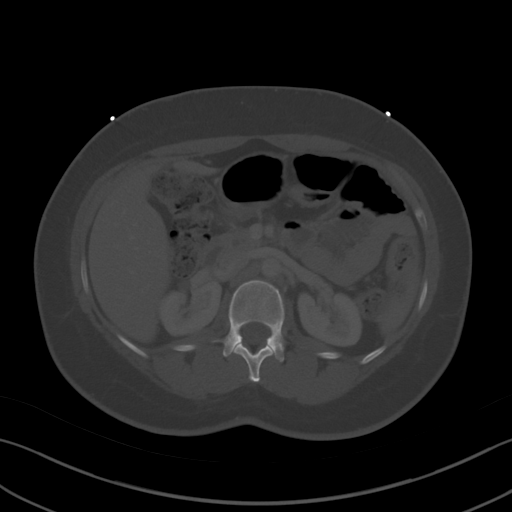
[im 71/99  soft-tissue]
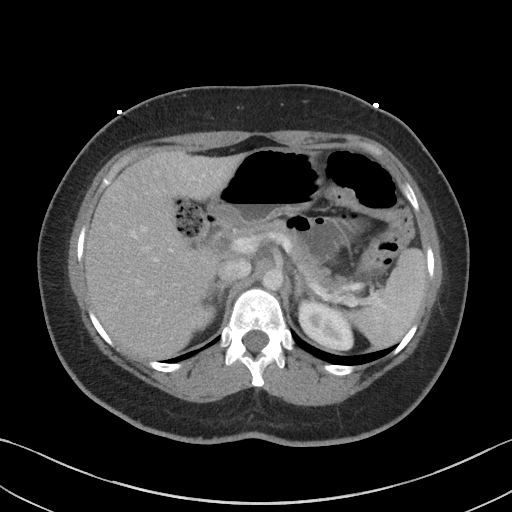
[im 77/99  soft-tissue]
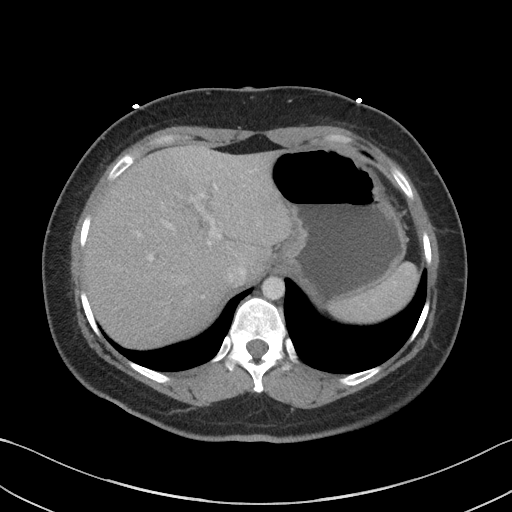
[im 88/99  soft-tissue]
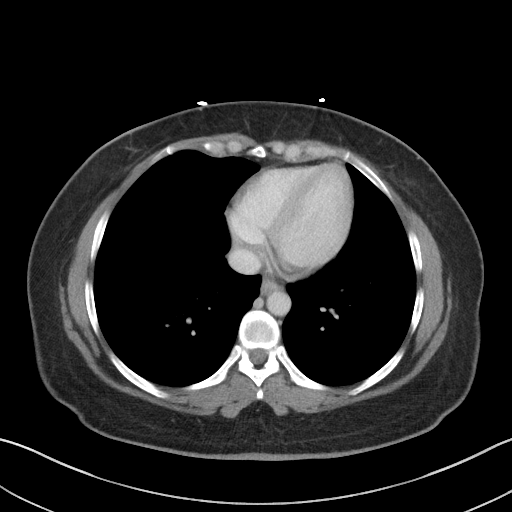
[im 93/99  soft-tissue]
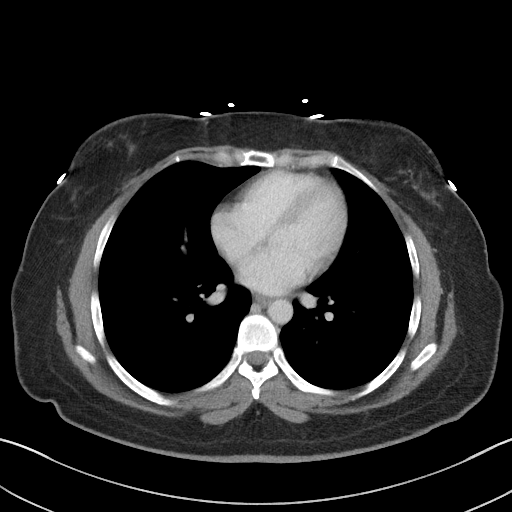

[Series 5: a/p w/ cor · coronal · 0.87mm/px · 3 of 121 slices shown]
[im 41/121  soft-tissue]
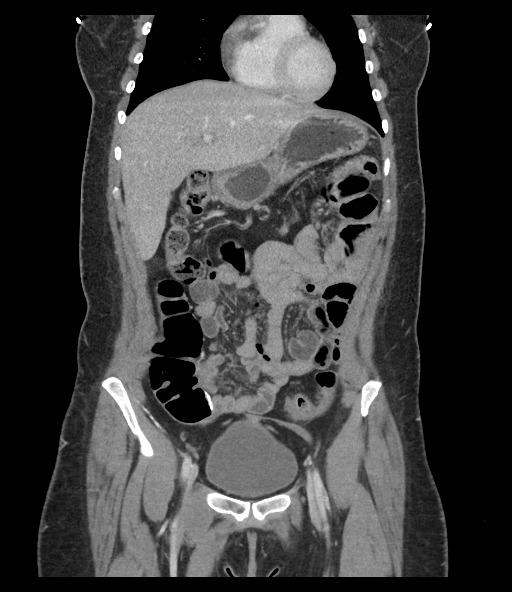
[im 54/121  soft-tissue]
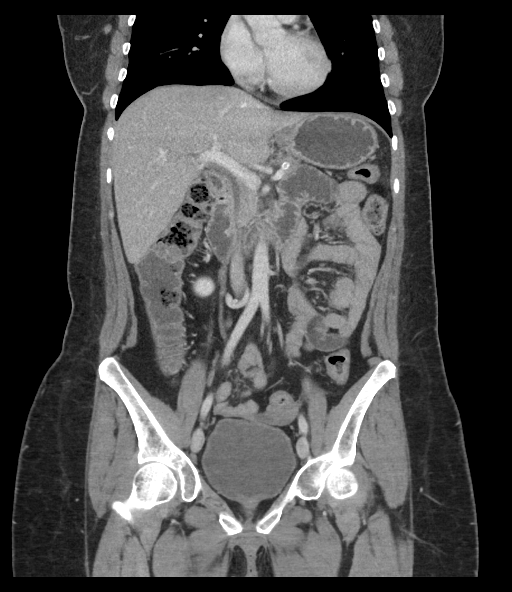
[im 67/121  soft-tissue]
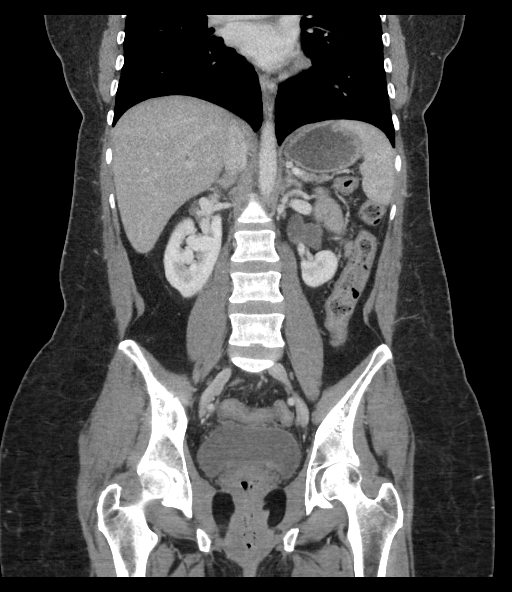

[16 of 46 positions shown; findings below may reference images not displayed]

FINDINGS: Lower chest:  The included lung bases are clear.

Liver: No focal lesion.

Hepatobiliary: Clips in the gallbladder fossa postcholecystectomy.
No biliary dilatation.

Pancreas: No ductal dilatation or inflammation.

Spleen: Normal.

Adrenal glands: No nodule.

Kidneys: Symmetric renal enhancement. No hydronephrosis. Mild
malrotation with renal hila direct anteriorly, unchanged. Small 2 mm
calcification, nonobstructing left renal stone versus parenchymal
calcification unchanged.

Stomach/Bowel: Stomach physiologically distended. There are
prominent fluid-filled small bowel loops in the left upper quadrant
of the abdomen, measuring up to 2.9 cm. No definite associated bowel
wall thickening, questionable mild edema in the adjacent mesentery.
Remaining small bowel loops are decompressed. Small volume of stool
throughout the colon without colonic wall thickening. Surgical clip
at the base of the cecum post appendectomy.

Vascular/Lymphatic: No retroperitoneal adenopathy. Abdominal aorta
is normal in caliber. Accessory lower pole left renal arteries. Mild
atherosclerosis.

Reproductive: Uterus is surgically absent.  No adnexal mass.

Bladder: Physiologically distended, no wall thickening.

Other: No free air, free fluid, or intra-abdominal fluid collection.

Musculoskeletal: There are no acute or suspicious osseous
abnormalities. Transitional lumbosacral anatomy.
IMPRESSION: 1. A few prominent fluid-filled small bowel loops in the left upper
quadrant of the abdomen, may reflect enteritis or focal ileus. No
evidence of obstruction.
2. Punctate nonobstructing left renal calculus versus parenchymal
calcification.

## 2017-01-10 ENCOUNTER — Emergency Department (HOSPITAL_BASED_OUTPATIENT_CLINIC_OR_DEPARTMENT_OTHER)
Admission: EM | Admit: 2017-01-10 | Discharge: 2017-01-10 | Disposition: A | Discharge status: Home / Self Care | Payer: Medicare PPO | Attending: Emergency Medicine | Admitting: Emergency Medicine

## 2017-01-10 ENCOUNTER — Emergency Department (HOSPITAL_BASED_OUTPATIENT_CLINIC_OR_DEPARTMENT_OTHER): Payer: Medicare PPO

## 2017-01-10 ENCOUNTER — Other Ambulatory Visit: Payer: Self-pay

## 2017-01-10 ENCOUNTER — Encounter (HOSPITAL_BASED_OUTPATIENT_CLINIC_OR_DEPARTMENT_OTHER): Payer: Self-pay | Admitting: Emergency Medicine

## 2017-01-10 DIAGNOSIS — Z79899 Other long term (current) drug therapy: Secondary | ICD-10-CM | POA: Insufficient documentation

## 2017-01-10 DIAGNOSIS — E1040 Type 1 diabetes mellitus with diabetic neuropathy, unspecified: Secondary | ICD-10-CM | POA: Diagnosis not present

## 2017-01-10 DIAGNOSIS — Z7901 Long term (current) use of anticoagulants: Secondary | ICD-10-CM | POA: Diagnosis not present

## 2017-01-10 DIAGNOSIS — R1084 Generalized abdominal pain: Secondary | ICD-10-CM | POA: Diagnosis not present

## 2017-01-10 DIAGNOSIS — Z8673 Personal history of transient ischemic attack (TIA), and cerebral infarction without residual deficits: Secondary | ICD-10-CM | POA: Diagnosis not present

## 2017-01-10 DIAGNOSIS — R0602 Shortness of breath: Secondary | ICD-10-CM

## 2017-01-10 DIAGNOSIS — R079 Chest pain, unspecified: Secondary | ICD-10-CM | POA: Diagnosis not present

## 2017-01-10 DIAGNOSIS — Z794 Long term (current) use of insulin: Secondary | ICD-10-CM | POA: Insufficient documentation

## 2017-01-10 DIAGNOSIS — E785 Hyperlipidemia, unspecified: Secondary | ICD-10-CM | POA: Insufficient documentation

## 2017-01-10 LAB — PROTIME-INR
INR: 1.22
Prothrombin Time: 15.3 seconds — ABNORMAL HIGH (ref 11.4–15.2)

## 2017-01-10 LAB — CBC
HCT: 41.2 % (ref 36.0–46.0)
Hemoglobin: 13.9 g/dL (ref 12.0–15.0)
MCH: 31.3 pg (ref 26.0–34.0)
MCHC: 33.7 g/dL (ref 30.0–36.0)
MCV: 92.8 fL (ref 78.0–100.0)
Platelets: 383 10*3/uL (ref 150–400)
RBC: 4.44 MIL/uL (ref 3.87–5.11)
RDW: 12.3 % (ref 11.5–15.5)
WBC: 10.2 10*3/uL (ref 4.0–10.5)

## 2017-01-10 LAB — URINALYSIS, MICROSCOPIC (REFLEX): RBC / HPF: NONE SEEN RBC/hpf (ref 0–5)

## 2017-01-10 LAB — CBG MONITORING, ED
Glucose-Capillary: 53 mg/dL — ABNORMAL LOW (ref 65–99)
Glucose-Capillary: 117 mg/dL — ABNORMAL HIGH (ref 65–99)
Glucose-Capillary: 43 mg/dL — CL (ref 65–99)
Glucose-Capillary: 53 mg/dL — ABNORMAL LOW (ref 65–99)

## 2017-01-10 LAB — COMPREHENSIVE METABOLIC PANEL
ALT: 25 U/L (ref 14–54)
AST: 21 U/L (ref 15–41)
Albumin: 3.7 g/dL (ref 3.5–5.0)
Alkaline Phosphatase: 120 U/L (ref 38–126)
Anion gap: 6 (ref 5–15)
BUN: 11 mg/dL (ref 6–20)
CO2: 29 mmol/L (ref 22–32)
Calcium: 9.1 mg/dL (ref 8.9–10.3)
Chloride: 101 mmol/L (ref 101–111)
Creatinine, Ser: 0.85 mg/dL (ref 0.44–1.00)
GFR calc Af Amer: >60 mL/min (ref >60)
GFR calc non Af Amer: >60 mL/min (ref >60)
Glucose, Bld: 180 mg/dL — ABNORMAL HIGH (ref 65–99)
Potassium: 4 mmol/L (ref 3.5–5.1)
Sodium: 136 mmol/L (ref 135–145)
Total Bilirubin: 1.3 mg/dL — ABNORMAL HIGH (ref 0.3–1.2)
Total Protein: 7.8 g/dL (ref 6.5–8.1)

## 2017-01-10 LAB — TROPONIN I: Troponin I: <0.03 ng/mL (ref <0.03)

## 2017-01-10 LAB — URINALYSIS, ROUTINE W REFLEX MICROSCOPIC
Bilirubin Urine: NEGATIVE
Glucose, UA: >=500 mg/dL — AB
Hgb urine dipstick: NEGATIVE
Ketones, ur: 15 mg/dL — AB
Leukocytes, UA: NEGATIVE
Nitrite: NEGATIVE
Protein, ur: NEGATIVE mg/dL
Specific Gravity, Urine: 1.01 (ref 1.005–1.030)
pH: 6.5 (ref 5.0–8.0)

## 2017-01-10 LAB — LIPASE, BLOOD: Lipase: 19 U/L (ref 11–51)

## 2017-01-10 MED ORDER — IOPAMIDOL (ISOVUE-370) INJECTION 76%
100.0000 mL | Freq: Once | INTRAVENOUS | Status: AC | PRN
  Administered 2017-01-10: 100 mL via INTRAVENOUS

## 2017-01-10 MED ORDER — SODIUM CHLORIDE 0.9 % IV BOLUS (SEPSIS)
1000.0000 mL | Freq: Once | INTRAVENOUS | Status: AC
  Administered 2017-01-10: 1000 mL via INTRAVENOUS

## 2017-01-10 MED ORDER — MORPHINE SULFATE (PF) 4 MG/ML IV SOLN
4.0000 mg | Freq: Once | INTRAVENOUS | Status: AC
  Administered 2017-01-10: 4 mg via INTRAMUSCULAR
  Filled 2017-01-10: qty 1

## 2017-01-10 NOTE — ED Notes (Signed)
Per radiology protocol, CT will need to wait for bun/creat/egr to result prior to imaging with IV contrast, as pt has hx DM

## 2017-01-10 NOTE — ED Notes (Signed)
RN immediately made aware of CBG. Pt given PB crackers and juice.

## 2017-01-10 NOTE — ED Notes (Signed)
During triage the patient mentions that she lost her medications container while she was in [place] - she has not taken multiple mediations including Coumadin for the last few days

## 2017-01-10 NOTE — ED Notes (Addendum)
Attempted IV x 1 to RAC, tol well. Attempted lab draw to left hand, unsuccessful, RN and ED MD informed.

## 2017-01-10 NOTE — ED Triage Notes (Signed)
Patient states that she came in today because she has had Chest pain, SOB and generalized body aches x 2 weeks. The patient reports that today she started to have worsening chest pain and SOB

## 2017-01-10 NOTE — ED Notes (Signed)
ED Provider at bedside. 

## 2017-01-10 NOTE — ED Notes (Signed)
Pt given donut, apple juice and water. PA and RN aware.

## 2017-01-10 NOTE — ED Notes (Signed)
Attempted arterial stick x2 per MD for labs with no luck. Pateint does not want to try again. MD informed

## 2017-01-10 NOTE — ED Notes (Signed)
Pt in CT.

## 2017-01-10 NOTE — ED Provider Notes (Signed)
[facility] EMERGENCY DEPARTMENT Provider Note   CSN: [id number] Arrival date & time: 01/10/17  1017     History   Chief Complaint Chief Complaint  Patient presents with  . Generalized Body Aches    HPI Maureen [name] is a 46 y.o. female with a history of type 1 diabetes, multiple DVTs, PE, PTSD, kidney stones, TIA, who presents today for evaluation of chest pain and shortness of breath.  She reports that this woke her up this morning at around 3 AM.  She says that her chest pain is like a band or pressure from under her arm to underarm.  She has not found anything that makes it better or worse.  She has recent long travel and that she took a bus from [place] to [place].  She also reports that she has not been taking her warfarin for at least the past 3 days, secondary to nausea and "neglecting" her own health.  She reports that initially she thought that this was anxiety related as her brother passed away in December 15, 2022 and recently her mother had cardiac problems and attempted to wait it out, however it did not get better.  She reports that yesterday she was having abdominal pain that was so bad she did not feel like she could safely make it to the emergency room so she took a nap.  She reports that it felt slightly better after however is still hurting her.  She endorses nausea without vomiting, no diarrhea or constipation.  She denies any urinary symptoms, no urgency, frequency, dysuria, or hematuria.  She denies any abnormal vaginal drainage, is status post hysterectomy.  Does report that last week for 2 days she had a fever which she measured highest at 100.9.    HPI  Past Medical History:  Diagnosis Date  . Abdominal pain   . Achilles tendon contracture, left    with lisfrabc fracture  . Anxiety   . Arthritis   . Biliary dyskinesia   . Cervical strain   . Closed head injury 2010  . Depression   . Elevated LFTs   . Fibromyalgia   . GERD (gastroesophageal  reflux disease)   . History of kidney stones   . Hyperlipidemia   . Hypoglycemia   . Joint pain   . Major depressive disorder   . Migraine    hx of none recent  . Nausea & vomiting   . OSA on CPAP 05/14/2016   Moderate to severe OSA with an AHI of 28/hr now on CPAP at 19cm H2O  . Pneumonia   . Post traumatic stress disorder (PTSD)   . Pulmonary embolism ([place])   . Renal stones   . Skin rash    due to medications  . Sleep trouble   . Stroke [facility])    " series of mini strokes around 2007"  . TIA (transient ischemic attack)   . Wears glasses   . Well controlled type 1 diabetes mellitus with peripheral neuropathy ([place])    Type 1    Patient Active Problem List   Diagnosis Date Noted  . OSA on CPAP 05/14/2016  . Obesity (BMI 30-39.9) 05/14/2016  . Acute deep vein thrombosis (DVT) of femoral vein of left lower extremity ([place]) 02/10/2016  . Diabetic polyneuropathy associated with type 2 diabetes mellitus ([place]) 02/10/2016  . Lisfranc dislocation, left, sequela 12/16/2015  . Short Achilles tendon (acquired), left ankle 12/16/2015  . Chest pain with moderate risk for cardiac etiology  11/29/2015  . Elevated troponin 11/19/2015  . Migraine headache 11/19/2015  . Neck pain 11/19/2015  . Acute kidney injury ([place]) 02/19/2015  . DKA (diabetic ketoacidoses) ([place]) 10/24/2014  . HLD (hyperlipidemia) 10/24/2014  . Abnormal LFTs 09/03/2014  . Dizziness 09/03/2014  . Acute pulmonary embolism ([place]) 09/03/2014  . Transaminitis 09/02/2014  . GAD (generalized anxiety disorder) 11/23/2013  . PTSD (post-traumatic stress disorder) 11/23/2013  . Severe major depression without psychotic features ([place]) 10/10/2013  . Endometriosis 12/08/2011  . Arthritis of left knee 12/08/2011  . Diabetic retinopathy ([place]) 12/08/2011  . Fibromyalgia 10/23/2011  . DM type 1 (diabetes mellitus, type 1) ([place]) 10/13/2011    Past Surgical History:  Procedure Laterality Date  . ABDOMINAL HYSTERECTOMY  2001  .  APPENDECTOMY    . [place]  . CHOLECYSTECTOMY  02/10/2011   Procedure: LAPAROSCOPIC CHOLECYSTECTOMY WITH INTRAOPERATIVE CHOLANGIOGRAM;  Surgeon: Judieth Keens, DO;  Location: [facility];  Service: General;  Laterality: N/A;  . COLONOSCOPY    . DILATION AND CURETTAGE OF UTERUS    . exploratory laparomty  Mar 04, 2012  . EYE SURGERY  2006   laser eye surgery left eye  . KNEE SURGERY Left 2012  . LAPAROSCOPIC APPENDECTOMY N/A 10/12/2012   Procedure: DIAGNOSTIC APPENDECTOMY LAPAROSCOPIC;  Surgeon: Madilyn Hook, DO;  Location: [facility];  Service: General;  Laterality: N/A;  . lipoma removed  yrs ago  . LIVER BIOPSY    . ORIF ANKLE FRACTURE Left 01/03/2016   Procedure: OPEN REDUCTION INTERNAL FIXATION (ORIF) LEFT LISFRANC JOINT, GASTROC RECESSION;  Surgeon: Newt Minion, MD;  Location: [facility];  Service: Orthopedics;  Laterality: Left;  . ROBOTIC ASSISTED LAPAROSCOPIC LYSIS OF ADHESION N/A 03/04/2012   Procedure: ROBOTIC ASSISTED LAPAROSCOPIC LYSIS OF EXTENSIVE ADHESIONS;  Surgeon: Claiborne Billings A. Pamala Hurry, MD;  Location: [facility];  Service: Gynecology;  Laterality: N/A;  . TONSILLECTOMY  2001    OB History    No data available       Home Medications    Prior to Admission medications   Medication Sig Start Date End Date Taking? Authorizing Provider  acetaminophen (TYLENOL) 500 MG tablet Take 1 tablet (500 mg total) by mouth every 8 (eight) hours as needed for headache. 11/20/15   Arrien, Jimmy Picket, MD  atorvastatin (LIPITOR) 40 MG tablet Take 40 mg by mouth at bedtime.     [provider]  baclofen (LIORESAL) 10 MG tablet TK 1 T PO TID 06/15/16   [provider]  citalopram (CELEXA) 20 MG tablet Take 1 tablet (20 mg total) by mouth daily. 05/15/16   Nche, Charlene Brooke, NP  gabapentin (NEURONTIN) 300 MG capsule Take 300 mg by mouth daily as needed (for pain).  12/11/15   [provider]  insulin aspart (NOVOLOG) 100 UNIT/ML injection Inject 8 Units  into the skin 3 (three) times daily with meals. 03/05/16   Golden Circle, FNP  insulin aspart protamine - aspart (NOVOLOG 70/30 MIX) (70-30) 100 UNIT/ML FlexPen Inject into the skin.    [provider]  insulin glargine (LANTUS) 100 UNIT/ML injection Inject 0.28 mLs (28 Units total) into the skin at bedtime. Patient taking differently: Inject 30 Units into the skin at bedtime.  03/05/16   Golden Circle, FNP  lidocaine (LIDODERM) 5 % Place 1 patch onto the skin daily as needed (pain). Remove after 12 hours. 10/30/15   [provider]  ondansetron (ZOFRAN-ODT) 4 MG disintegrating tablet TK 1 T PO Q 8  H PRN NAUSEA FOR UP TO 7 DAYS 06/15/16   [provider]  promethazine (PHENERGAN) 25 MG tablet Take 1 tablet (25 mg total) by mouth every 6 (six) hours as needed for nausea. 06/17/16   Clayton Bibles, PA-C  Suvorexant (BELSOMRA) 10 MG TABS Take 1 tablet by mouth daily. 05/15/16   Nche, Charlene Brooke, NP  tiZANidine (ZANAFLEX) 4 MG tablet TAKE ONE TABLET BY MOUTH EVERY 6 HOURS AS NEEDED FOR MUSCLE SPASM 06/09/16   [provider]  traMADol (ULTRAM) 50 MG tablet TK 1 T PO Q 8 H PRN P 06/22/16   [provider]  warfarin (COUMADIN) 5 MG tablet TAKE 1 TABLET(5 MG) BY MOUTH DAILY AT 6 PM 07/06/16   [place], Holli Humbles, NP    Family History Family History  Problem Relation Age of Onset  . Cancer Father        lymphoma  . Nephrolithiasis Father   . Vascular Disease Father   . Alcohol abuse Father   . Drug abuse Father   . Cancer Maternal Grandmother        colon  . Hypertension Mother   . Heart disease Mother   . Cataracts Mother   . Depression Mother   . Diabetes Brother   . Drug abuse Brother   . Mental illness Maternal Grandfather   . Cancer Maternal Grandfather   . Heart disease Paternal Grandmother   . Heart disease Paternal Grandfather   . Suicidality Maternal Uncle     Social History Social History   Tobacco Use  . Smoking status: Never  Smoker  . Smokeless tobacco: Never Used  Substance Use Topics  . Alcohol use: Yes    Comment: Socially  . Drug use: No     Allergies   Cephalexin; Ibuprofen; and Meloxicam   Review of Systems Review of Systems  Constitutional: Negative for chills and fever.  HENT: Negative for ear pain and sore throat.   Eyes: Negative for pain and visual disturbance.  Respiratory: Positive for chest tightness and shortness of breath. Negative for cough.   Cardiovascular: Positive for chest pain. Negative for palpitations and leg swelling.       Bilateral calf pain  Gastrointestinal: Positive for abdominal pain and nausea. Negative for constipation, diarrhea and vomiting.  Genitourinary: Negative for dysuria and hematuria.  Musculoskeletal: Negative for arthralgias and back pain.  Skin: Negative for color change and rash.  Neurological: Negative for seizures, syncope and headaches.  All other systems reviewed and are negative.    Physical Exam Updated Vital Signs BP 128/61 (BP Location: Right Arm)   Pulse 84   Temp 99.1 F (37.3 C) (Oral)   Resp 18   Ht 5\' 4"  (1.626 m)   Wt 74.8 kg (165 lb)   SpO2 98%   BMI 28.32 kg/m   Physical Exam  Constitutional: She is oriented to person, place, and time. She appears well-developed and well-nourished. No distress.  HENT:  Head: Normocephalic and atraumatic.  Eyes: Conjunctivae are normal.  Neck: Neck supple.  Cardiovascular: Normal rate, regular rhythm, normal heart sounds and intact distal pulses.  No murmur heard. Pulmonary/Chest: Effort normal and breath sounds normal. No respiratory distress.  Abdominal: Soft. Bowel sounds are normal. There is generalized tenderness and tenderness in the right upper quadrant and left lower quadrant.  Musculoskeletal: She exhibits no edema.  No TTP over bilateral calfs.  No obvious swelling.   Neurological: She is alert and oriented to person, place, and time.  Skin: Skin is warm and dry.  Psychiatric:  She has a normal mood and affect. Her behavior is normal.  Nursing note and vitals reviewed.    ED Treatments / Results  Labs (all labs ordered are listed, but only abnormal results are displayed) Labs Reviewed  PROTIME-INR - Abnormal; Notable for the following components:      Result Value   Prothrombin Time 15.3 (*)    All other components within normal limits  COMPREHENSIVE METABOLIC PANEL - Abnormal; Notable for the following components:   Glucose, Bld 180 (*)    Total Bilirubin 1.3 (*)    All other components within normal limits  URINALYSIS, ROUTINE W REFLEX MICROSCOPIC - Abnormal; Notable for the following components:   Glucose, UA >=500 (*)    Ketones, ur 15 (*)    All other components within normal limits  URINALYSIS, MICROSCOPIC (REFLEX) - Abnormal; Notable for the following components:   Bacteria, UA MANY (*)    Squamous Epithelial / LPF 0-5 (*)    All other components within normal limits  CBG MONITORING, ED - Abnormal; Notable for the following components:   Glucose-Capillary 53 (*)    All other components within normal limits  CBG MONITORING, ED - Abnormal; Notable for the following components:   Glucose-Capillary 43 (*)    All other components within normal limits  CBG MONITORING, ED - Abnormal; Notable for the following components:   Glucose-Capillary 53 (*)    All other components within normal limits  CBG MONITORING, ED - Abnormal; Notable for the following components:   Glucose-Capillary 117 (*)    All other components within normal limits  CBC  TROPONIN I  LIPASE, BLOOD    EKG  EKG Interpretation  Date/Time:  Sunday January 10 2017 10:27:47 EST Ventricular Rate:  85 PR Interval:    QRS Duration: 75 QT Interval:  364 QTC Calculation: 433 R Axis:   92 Text Interpretation:  Sinus rhythm Borderline right axis deviation Confirmed by Isla Pence [phone]) on 01/10/2017 10:38:00 AM       Radiology Ct Angio Chest Pe W/cm &/or Wo Cm  Result  Date: 01/10/2017 CLINICAL DATA:  Shortness of breath, lower abdominal pain. EXAM: CT ANGIOGRAPHY CHEST CT ABDOMEN AND PELVIS WITH CONTRAST TECHNIQUE: Multidetector CT imaging of the chest was performed using the standard protocol during bolus administration of intravenous contrast. Multiplanar CT image reconstructions and MIPs were obtained to evaluate the vascular anatomy. Multidetector CT imaging of the abdomen and pelvis was performed using the standard protocol during bolus administration of intravenous contrast. CONTRAST:  152mL ISOVUE-370 IOPAMIDOL (ISOVUE-370) INJECTION 76% COMPARISON:  CT abdomen pelvis 06/15/2016 FINDINGS: CTA CHEST FINDINGS Cardiovascular: No filling defects in the pulmonary artery is to suggest pulmonary emboli. Heart is normal size. Aorta is normal caliber. Mediastinum/Nodes: No mediastinal, hilar, or axillary adenopathy. Trachea and esophagus are unremarkable. Lungs/Pleura: Lungs are clear. No focal airspace opacities or suspicious nodules. No effusions. Musculoskeletal: No acute bony abnormality. Review of the MIP images confirms the above findings. CT ABDOMEN and PELVIS FINDINGS Hepatobiliary: No focal liver abnormality is seen. Status post cholecystectomy. No biliary dilatation. Pancreas: No focal abnormality or ductal dilatation. Spleen: No focal abnormality.  Normal size. Adrenals/Urinary Tract: No adrenal abnormality. No focal renal abnormality. No stones or hydronephrosis. Urinary bladder is unremarkable. Stomach/Bowel: Prior appendectomy. Stomach, large and small bowel grossly unremarkable. Vascular/Lymphatic: No evidence of aneurysm or adenopathy. Reproductive:  No acute findings. Other: No abdominal wall hernia or abnormality. No abdominopelvic ascites. Musculoskeletal: No  acute bony abnormality. Review of the MIP images confirms the above findings. IMPRESSION: No evidence of pulmonary embolus.  No acute cardiopulmonary disease. No acute findings in the abdomen or pelvis.  Electronically Signed   By: Rolm Baptise M.D.   On: 01/10/2017 14:57   Ct Abdomen Pelvis W Contrast  Result Date: 01/10/2017 CLINICAL DATA:  Shortness of breath, lower abdominal pain. EXAM: CT ANGIOGRAPHY CHEST CT ABDOMEN AND PELVIS WITH CONTRAST TECHNIQUE: Multidetector CT imaging of the chest was performed using the standard protocol during bolus administration of intravenous contrast. Multiplanar CT image reconstructions and MIPs were obtained to evaluate the vascular anatomy. Multidetector CT imaging of the abdomen and pelvis was performed using the standard protocol during bolus administration of intravenous contrast. CONTRAST:  147mL ISOVUE-370 IOPAMIDOL (ISOVUE-370) INJECTION 76% COMPARISON:  CT abdomen pelvis 06/15/2016 FINDINGS: CTA CHEST FINDINGS Cardiovascular: No filling defects in the pulmonary artery is to suggest pulmonary emboli. Heart is normal size. Aorta is normal caliber. Mediastinum/Nodes: No mediastinal, hilar, or axillary adenopathy. Trachea and esophagus are unremarkable. Lungs/Pleura: Lungs are clear. No focal airspace opacities or suspicious nodules. No effusions. Musculoskeletal: No acute bony abnormality. Review of the MIP images confirms the above findings. CT ABDOMEN and PELVIS FINDINGS Hepatobiliary: No focal liver abnormality is seen. Status post cholecystectomy. No biliary dilatation. Pancreas: No focal abnormality or ductal dilatation. Spleen: No focal abnormality.  Normal size. Adrenals/Urinary Tract: No adrenal abnormality. No focal renal abnormality. No stones or hydronephrosis. Urinary bladder is unremarkable. Stomach/Bowel: Prior appendectomy. Stomach, large and small bowel grossly unremarkable. Vascular/Lymphatic: No evidence of aneurysm or adenopathy. Reproductive:  No acute findings. Other: No abdominal wall hernia or abnormality. No abdominopelvic ascites. Musculoskeletal: No acute bony abnormality. Review of the MIP images confirms the above findings. IMPRESSION: No  evidence of pulmonary embolus.  No acute cardiopulmonary disease. No acute findings in the abdomen or pelvis. Electronically Signed   By: Rolm Baptise M.D.   On: 01/10/2017 14:57    Procedures Procedures (including critical care time)  Medications Ordered in ED Medications  sodium chloride 0.9 % bolus 1,000 mL (0 mLs Intravenous Stopped 01/10/17 1433)  morphine 4 MG/ML injection 4 mg (4 mg Intramuscular Given 01/10/17 1239)  iopamidol (ISOVUE-370) 76 % injection 100 mL (100 mLs Intravenous Contrast Given 01/10/17 1428)     Initial Impression / Assessment and Plan / ED Course  I have reviewed the triage vital signs and the nursing notes.  Pertinent labs & imaging results that were available during my care of the patient were reviewed by me and considered in my medical decision making (see chart for details).  Clinical Course as of Jan 11 1728  Nancy Fetter Jan 10, 2017  1242 Have been notified of delays in obtaining blood work secondary to lack of vascular access.  Gave verbal orders for art stick for blood work however patient moved when stuck making it unsuccessful.   IV ultrasound RN arriving soon.    [EH]  1618 Went to discharge patient, sugar 53, will treat with PO intake and re-check in 1/2 hour.   [EH]  1657 Patient sugar had dropped to 43, instructed re-check, continue PO sugar as patient is not having mental status changes.   [EH]  1721 Patient blood sugar 117.  Patient discharged.  Her chest pain and shortness of breath have fully resolved while in the ED.  [EH]    Clinical Course User Index [EH] Lorin Glass, PA-C   Patient is to be discharged with recommendation to follow  up with PCP in regards to today's hospital visit. Chest pain is not likely of cardiac or pulmonary etiology d/t presentation,  VSS, no tracheal deviation, no JVD or new murmur, RRR, breath sounds equal bilaterally, EKG without acute abnormalities, negative troponin, and negative CT chest with contrast.  No PE  based on CT. Pt has been advised to return to the ED if CP becomes exertional, associated with diaphoresis or nausea, radiates to left jaw/arm, worsens or becomes concerning in any way. Patient is nontoxic, nonseptic appearing, in no apparent distress.  Patient's pain and other symptoms adequately managed in emergency department.  Fluid bolus given.  Patient does not meet the SIRS or Sepsis criteria.  On repeat exam patient does not have a surgical abdomen and there are no peritoneal signs.  No indication of appendicitis, bowel obstruction, bowel perforation, cholecystitis, diverticulitis, PID or ectopic pregnancy.  Patient discharged home with symptomatic treatment and given strict instructions for follow-up with their primary care physician.  I have also discussed reasons to return immediately to the ER.  Patient expresses understanding and agrees with plan.  Patient was given strict instructions regarding compliance with warfarin.  She reports that she has adequate warfarin at home and will resume taking it.     Final Clinical Impressions(s) / ED Diagnoses   Final diagnoses:  Chest pain, unspecified type  Shortness of breath  Generalized abdominal pain    ED Discharge Orders    None       Ollen Gross 01/10/17 1729    Isla Pence, MD 01/14/17 1535

## 2017-01-10 NOTE — ED Notes (Signed)
Pt on cardiac monitor.

## 2017-01-10 NOTE — Discharge Instructions (Signed)
Please keep a close eye on your sugars tonight, as you will most likely spike high later on.  Please follow-up with your primary care doctor regarding your ED visit and symptoms today.

## 2017-01-10 NOTE — ED Notes (Signed)
Pt asks to have blood sugar checked, states that she feels as though it is low. RN and PA aware.

## 2017-01-16 ENCOUNTER — Emergency Department (HOSPITAL_BASED_OUTPATIENT_CLINIC_OR_DEPARTMENT_OTHER)
Admission: EM | Admit: 2017-01-16 | Discharge: 2017-01-16 | Disposition: A | Discharge status: Home / Self Care | Payer: Medicare PPO | Attending: Emergency Medicine | Admitting: Emergency Medicine

## 2017-01-16 ENCOUNTER — Other Ambulatory Visit: Payer: Self-pay

## 2017-01-16 ENCOUNTER — Encounter (HOSPITAL_BASED_OUTPATIENT_CLINIC_OR_DEPARTMENT_OTHER): Payer: Self-pay | Admitting: Emergency Medicine

## 2017-01-16 DIAGNOSIS — J069 Acute upper respiratory infection, unspecified: Secondary | ICD-10-CM | POA: Diagnosis not present

## 2017-01-16 DIAGNOSIS — Z7901 Long term (current) use of anticoagulants: Secondary | ICD-10-CM | POA: Insufficient documentation

## 2017-01-16 DIAGNOSIS — Z8673 Personal history of transient ischemic attack (TIA), and cerebral infarction without residual deficits: Secondary | ICD-10-CM | POA: Diagnosis not present

## 2017-01-16 DIAGNOSIS — H66003 Acute suppurative otitis media without spontaneous rupture of ear drum, bilateral: Secondary | ICD-10-CM | POA: Insufficient documentation

## 2017-01-16 DIAGNOSIS — R05 Cough: Secondary | ICD-10-CM | POA: Diagnosis present

## 2017-01-16 DIAGNOSIS — E1040 Type 1 diabetes mellitus with diabetic neuropathy, unspecified: Secondary | ICD-10-CM | POA: Insufficient documentation

## 2017-01-16 DIAGNOSIS — B9789 Other viral agents as the cause of diseases classified elsewhere: Secondary | ICD-10-CM

## 2017-01-16 MED ORDER — AMOXICILLIN-POT CLAVULANATE 875-125 MG PO TABS: 1.0000 | ORAL_TABLET | Freq: Two times a day (BID) | ORAL | 0 refills | Status: AC

## 2017-01-16 NOTE — ED Triage Notes (Signed)
Pt reports non productive cough x3 days 

## 2017-01-16 NOTE — ED Provider Notes (Signed)
[facility] EMERGENCY DEPARTMENT Provider Note   CSN: [id number] Arrival date & time: 01/16/17  1438     History   Chief Complaint Chief Complaint  Patient presents with  . Cough    HPI Maureen [name] is a 46 y.o. female.  HPI   After leaving here on 12/23 developed cough.  Cough has been about 3-4 days.  Coughing up small amount of phlegm, not sure coloring, sometimes is dry cough.  Activity makes coughing worse.  Wearing scarf helps.  Low grade temperature, 100.9 yesterday.  Fatigue.  Chest pain with coughing.  No smoking, no history of lung disease. Has had ear pain for last several months. Waking up with ear pain at times. No significant body aches. Just notes fatigue. Had flu vaccine.  Past Medical History:  Diagnosis Date  . Abdominal pain   . Achilles tendon contracture, left    with lisfrabc fracture  . Anxiety   . Arthritis   . Biliary dyskinesia   . Cervical strain   . Closed head injury 2010  . Depression   . Elevated LFTs   . Fibromyalgia   . GERD (gastroesophageal reflux disease)   . History of kidney stones   . Hyperlipidemia   . Hypoglycemia   . Joint pain   . Major depressive disorder   . Migraine    hx of none recent  . Nausea & vomiting   . OSA on CPAP 05/14/2016   Moderate to severe OSA with an AHI of 28/hr now on CPAP at 19cm H2O  . Pneumonia   . Post traumatic stress disorder (PTSD)   . Pulmonary embolism ([place])   . Renal stones   . Skin rash    due to medications  . Sleep trouble   . Stroke [facility]    " series of mini strokes around 2007"  . TIA (transient ischemic attack)   . Wears glasses   . 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 OSA on CPAP 05/14/2016  . Obesity (BMI 30-39.9) 05/14/2016  . Acute deep vein thrombosis (DVT) of femoral vein of left lower extremity ([place]) 02/10/2016  . Diabetic polyneuropathy  associated with type 2 diabetes mellitus ([place]) 02/10/2016  . Lisfranc dislocation, left, sequela 12/16/2015  . Short Achilles tendon (acquired), left ankle 12/16/2015  . Chest pain with moderate risk for cardiac etiology 11/29/2015  . Elevated troponin 11/19/2015  . Migraine headache 11/19/2015  . Neck pain 11/19/2015  . Acute kidney injury ([place]) 02/19/2015  . DKA (diabetic ketoacidoses) ([place]) 10/24/2014  . HLD (hyperlipidemia) 10/24/2014  . Abnormal LFTs 09/03/2014  . Dizziness 09/03/2014  . Acute pulmonary embolism ([place]) 09/03/2014  . Transaminitis 09/02/2014  . GAD (generalized anxiety disorder) 11/23/2013  . PTSD (post-traumatic stress disorder) 11/23/2013  . Severe major depression without psychotic features ([place]) 10/10/2013  . Endometriosis 12/08/2011  . Arthritis of left knee 12/08/2011  . Diabetic retinopathy ([place]) 12/08/2011  . Fibromyalgia 10/23/2011  . DM type 1 (diabetes mellitus, type 1) ([place]) 10/13/2011    Past Surgical History:  Procedure Laterality Date  . ABDOMINAL HYSTERECTOMY  2001  . APPENDECTOMY    . [place]  . CHOLECYSTECTOMY  02/10/2011   Procedure: LAPAROSCOPIC CHOLECYSTECTOMY WITH INTRAOPERATIVE CHOLANGIOGRAM;  Surgeon: Judieth Keens, DO;  Location: [facility];  Service: General;  Laterality: N/A;  . COLONOSCOPY    .  DILATION AND CURETTAGE OF UTERUS    . exploratory laparomty  Mar 04, 2012  . EYE SURGERY  2006   laser eye surgery left eye  . KNEE SURGERY Left 2012  . LAPAROSCOPIC APPENDECTOMY N/A 10/12/2012   Procedure: DIAGNOSTIC APPENDECTOMY LAPAROSCOPIC;  Surgeon: Madilyn Hook, DO;  Location: [facility];  Service: General;  Laterality: N/A;  . lipoma removed  yrs ago  . LIVER BIOPSY    . ORIF ANKLE FRACTURE Left 01/03/2016   Procedure: OPEN REDUCTION INTERNAL FIXATION (ORIF) LEFT LISFRANC JOINT, GASTROC RECESSION;  Surgeon: Newt Minion, MD;  Location: [facility];  Service: Orthopedics;  Laterality: Left;  . ROBOTIC ASSISTED  LAPAROSCOPIC LYSIS OF ADHESION N/A 03/04/2012   Procedure: ROBOTIC ASSISTED LAPAROSCOPIC LYSIS OF EXTENSIVE ADHESIONS;  Surgeon: Claiborne Billings A. Pamala Hurry, MD;  Location: [facility];  Service: Gynecology;  Laterality: N/A;  . TONSILLECTOMY  2001    OB History    No data available       Home Medications    Prior to Admission medications   Medication Sig Start Date End Date Taking? Authorizing Provider  atorvastatin (LIPITOR) 40 MG tablet Take 40 mg by mouth at bedtime.    Yes [provider]  cholecalciferol (VITAMIN D) 1000 units tablet Take 50,000 Units by mouth every Wednesday.   Yes [provider]  gabapentin (NEURONTIN) 300 MG capsule Take 300 mg by mouth daily as needed (for pain).  12/11/15  Yes [provider]  insulin aspart (NOVOLOG) 100 UNIT/ML injection Inject 8 Units into the skin 3 (three) times daily with meals. 03/05/16  Yes Golden Circle, FNP  insulin glargine (LANTUS) 100 UNIT/ML injection Inject 0.28 mLs (28 Units total) into the skin at bedtime. Patient taking differently: Inject 30 Units into the skin at bedtime.  03/05/16  Yes Golden Circle, FNP  lidocaine (LIDODERM) 5 % Place 1 patch onto the skin daily as needed (pain). Remove after 12 hours. 10/30/15  Yes [provider]  ondansetron (ZOFRAN-ODT) 4 MG disintegrating tablet TK 1 T PO Q 8 H PRN NAUSEA FOR UP TO 7 DAYS 06/15/16  Yes [provider]  warfarin (COUMADIN) 5 MG tablet TAKE 1 TABLET(5 MG) BY MOUTH DAILY AT 6 PM Patient taking differently: TAKE 1 TABLET(6 MG) BY MOUTH DAILY AT 6 PM 07/06/16  Yes [place], Holli Humbles, NP  acetaminophen (TYLENOL) 500 MG tablet Take 1 tablet (500 mg total) by mouth every 8 (eight) hours as needed for headache. 11/20/15   Arrien, Jimmy Picket, MD  amoxicillin-clavulanate (AUGMENTIN) 875-125 MG tablet Take 1 tablet by mouth every 12 (twelve) hours for 10 days. 01/16/17 01/26/17  Gareth Morgan, MD  baclofen (LIORESAL) 10 MG tablet TK 1 T PO  TID 06/15/16   [provider]  citalopram (CELEXA) 20 MG tablet Take 1 tablet (20 mg total) by mouth daily. 05/15/16   Nche, Charlene Brooke, NP  insulin aspart protamine - aspart (NOVOLOG 70/30 MIX) (70-30) 100 UNIT/ML FlexPen Inject into the skin.    [provider]  promethazine (PHENERGAN) 25 MG tablet Take 1 tablet (25 mg total) by mouth every 6 (six) hours as needed for nausea. 06/17/16   Clayton Bibles, PA-C  Suvorexant (BELSOMRA) 10 MG TABS Take 1 tablet by mouth daily. 05/15/16   Nche, Charlene Brooke, NP  tiZANidine (ZANAFLEX) 4 MG tablet TAKE ONE TABLET BY MOUTH EVERY 6 HOURS AS NEEDED FOR MUSCLE SPASM 06/09/16   [provider]  traMADol (ULTRAM) 50 MG tablet TK 1 T  PO Q 8 H PRN P 06/22/16   [provider]    Family History Family History  Problem Relation Age of Onset  . Cancer Father        lymphoma  . Nephrolithiasis Father   . Vascular Disease Father   . Alcohol abuse Father   . Drug abuse Father   . Cancer Maternal Grandmother        colon  . Hypertension Mother   . Heart disease Mother   . Cataracts Mother   . Depression Mother   . Diabetes Brother   . Drug abuse Brother   . Mental illness Maternal Grandfather   . Cancer Maternal Grandfather   . Heart disease Paternal Grandmother   . Heart disease Paternal Grandfather   . Suicidality Maternal Uncle     Social History Social History   Tobacco Use  . Smoking status: Never Smoker  . Smokeless tobacco: Never Used  Substance Use Topics  . Alcohol use: Yes    Comment: Socially  . Drug use: No     Allergies   Cephalexin; Ibuprofen; and Meloxicam   Review of Systems Review of Systems  Constitutional: Positive for fatigue and fever.  HENT: Positive for congestion, rhinorrhea and sore throat.   Eyes: Negative for visual disturbance.  Respiratory: Positive for cough. Negative for shortness of breath.   Cardiovascular: Negative for chest pain.  Gastrointestinal: Positive for  nausea (chronic). Negative for abdominal pain.  Genitourinary: Negative for difficulty urinating.  Musculoskeletal: Negative for back pain and neck pain.  Skin: Negative for rash.  Neurological: Negative for syncope and headaches.     Physical Exam Updated Vital Signs BP 125/76 (BP Location: Left Arm)   Pulse 86   Temp 98.3 F (36.8 C) (Oral)   Resp 18   SpO2 100%   Physical Exam  Constitutional: She is oriented to person, place, and time. She appears well-developed and well-nourished. No distress.  HENT:  Head: Normocephalic and atraumatic.  Mouth/Throat: Oropharynx is clear and moist. No oropharyngeal exudate.  Bilateral opaque TM, left appears white scarred, slightly bulging, right TM with yellow discoloration, bulging No sign of perforation  Eyes: Conjunctivae and EOM are normal. Pupils are equal, round, and reactive to light.  Neck: Normal range of motion.  Cardiovascular: Normal rate, regular rhythm, normal heart sounds and intact distal pulses. Exam reveals no gallop and no friction rub.  No murmur heard. Pulmonary/Chest: Effort normal and breath sounds normal. No respiratory distress. She has no wheezes. She has no rales.  Abdominal: Soft. She exhibits no distension. There is no tenderness. There is no guarding.  Musculoskeletal: She exhibits no edema or tenderness.  Neurological: She is alert and oriented to person, place, and time.  Skin: Skin is warm and dry. No rash noted. She is not diaphoretic. No erythema.  Nursing note and vitals reviewed.    ED Treatments / Results  Labs (all labs ordered are listed, but only abnormal results are displayed) Labs Reviewed - No data to display  EKG  EKG Interpretation None       Radiology No results found.  Procedures Procedures (including critical care time)  Medications Ordered in ED Medications - No data to display   Initial Impression / Assessment and Plan / ED Course  I have reviewed the triage vital  signs and the nursing notes.  Pertinent labs & imaging results that were available during my care of the patient were reviewed by me and considered in my medical  decision making (see chart for details).     46yo female with history above presents with concern for cough, congestion, ear pain.  Patient well appearing with normal vital signs. Cough for 3 days, normal breath sounds bilaterally, normal oxygenation, doubt pneumonia.  Low suspicion for flu given low grade temp, had vaccine, no myalgias.  Suspect likely viral URI. Pt with bilateral ear pain, with TM bilaterally concerning for otitis media as well as prior scarring. Given rx for augmentin. Discussed need for close monitoring of INR while on abx, need for close PCP follow up. Patient discharged in stable condition with understanding of reasons to return.   Final Clinical Impressions(s) / ED Diagnoses   Final diagnoses:  Viral URI with cough  Acute suppurative otitis media of both ears without spontaneous rupture of tympanic membranes, recurrence not specified    ED Discharge Orders        Ordered    amoxicillin-clavulanate (AUGMENTIN) 875-125 MG tablet  Every 12 hours     01/16/17 1512       Gareth Morgan, MD 01/16/17 1517

## 2017-01-24 ENCOUNTER — Emergency Department (HOSPITAL_COMMUNITY)
Admission: EM | Admit: 2017-01-24 | Discharge: 2017-01-24 | Disposition: A | Discharge status: Home / Self Care | Payer: Medicare PPO | Attending: Emergency Medicine | Admitting: Emergency Medicine

## 2017-01-24 ENCOUNTER — Encounter (HOSPITAL_COMMUNITY): Payer: Self-pay | Admitting: Emergency Medicine

## 2017-01-24 DIAGNOSIS — E10319 Type 1 diabetes mellitus with unspecified diabetic retinopathy without macular edema: Secondary | ICD-10-CM | POA: Diagnosis not present

## 2017-01-24 DIAGNOSIS — J029 Acute pharyngitis, unspecified: Secondary | ICD-10-CM | POA: Insufficient documentation

## 2017-01-24 DIAGNOSIS — J370 Chronic laryngitis: Secondary | ICD-10-CM | POA: Diagnosis not present

## 2017-01-24 DIAGNOSIS — R05 Cough: Secondary | ICD-10-CM | POA: Diagnosis present

## 2017-01-24 DIAGNOSIS — H663X1 Other chronic suppurative otitis media, right ear: Secondary | ICD-10-CM | POA: Diagnosis not present

## 2017-01-24 DIAGNOSIS — J0141 Acute recurrent pansinusitis: Secondary | ICD-10-CM | POA: Diagnosis not present

## 2017-01-24 DIAGNOSIS — E1042 Type 1 diabetes mellitus with diabetic polyneuropathy: Secondary | ICD-10-CM | POA: Diagnosis not present

## 2017-01-24 DIAGNOSIS — J040 Acute laryngitis: Secondary | ICD-10-CM

## 2017-01-24 MED ORDER — PROMETHAZINE-DM 6.25-15 MG/5ML PO SYRP: 5.0000 mL | ORAL_SOLUTION | Freq: Four times a day (QID) | ORAL | 0 refills | Status: DC | PRN

## 2017-01-24 MED ORDER — PREDNISONE 20 MG PO TABS: ORAL_TABLET | ORAL | 0 refills | Status: DC

## 2017-01-24 NOTE — ED Provider Notes (Signed)
[facility] DEPT Provider Note   CSN: [id number] Arrival date & time: 01/24/17  1300     History   Chief Complaint Chief Complaint  Patient presents with  . URI    HPI Maureen [name] is a 47 y.o. female.  HPI   47 year old female here with cold sxs. Pt sts she had tooth abscess and sinus infection 3 months ago and was treated with antibiotics.  Report she has had productive cough with yellow sputum, sore throat, hoarsed voice, and ear pain and ear drainage for the past 3 months.  Has been seen multiple times for this and have received abx many times without resolution.  Report nausea, headache, tactile fever.  Also report diarrhea, and body aches. Endorse dizziness, blurry eyes and sore throat.  sts ears are muffled.  Hx of diabetes, had her flu and pna shot last year.  Denies vomiting. Sob, or rash.    Past Medical History:  Diagnosis Date  . Abdominal pain   . Achilles tendon contracture, left    with lisfrabc fracture  . Anxiety   . Arthritis   . Biliary dyskinesia   . Cervical strain   . Closed head injury 2010  . Depression   . Elevated LFTs   . Fibromyalgia   . GERD (gastroesophageal reflux disease)   . History of kidney stones   . Hyperlipidemia   . Hypoglycemia   . Joint pain   . Major depressive disorder   . Migraine    hx of none recent  . Nausea & vomiting   . OSA on CPAP 05/14/2016   Moderate to severe OSA with an AHI of 28/hr now on CPAP at 19cm H2O  . Pneumonia   . Post traumatic stress disorder (PTSD)   . Pulmonary embolism ([place])   . Renal stones   . Skin rash    due to medications  . Sleep trouble   . Stroke [facility])    " series of mini strokes around 2007"  . TIA (transient ischemic attack)   . Wears glasses   . 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 OSA on CPAP 05/14/2016  . Obesity (BMI 30-39.9)  05/14/2016  . Acute deep vein thrombosis (DVT) of femoral vein of left lower extremity ([place]) 02/10/2016  . Diabetic polyneuropathy associated with type 2 diabetes mellitus ([place]) 02/10/2016  . Lisfranc dislocation, left, sequela 12/16/2015  . Short Achilles tendon (acquired), left ankle 12/16/2015  . Chest pain with moderate risk for cardiac etiology 11/29/2015  . Elevated troponin 11/19/2015  . Migraine headache 11/19/2015  . Neck pain 11/19/2015  . Acute kidney injury ([place]) 02/19/2015  . DKA (diabetic ketoacidoses) ([place]) 10/24/2014  . HLD (hyperlipidemia) 10/24/2014  . Abnormal LFTs 09/03/2014  . Dizziness 09/03/2014  . Acute pulmonary embolism ([place]) 09/03/2014  . Transaminitis 09/02/2014  . GAD (generalized anxiety disorder) 11/23/2013  . PTSD (post-traumatic stress disorder) 11/23/2013  . Severe major depression without psychotic features ([place]) 10/10/2013  . Endometriosis 12/08/2011  . Arthritis of left knee 12/08/2011  . Diabetic retinopathy ([place]) 12/08/2011  . Fibromyalgia 10/23/2011  . DM type 1 (diabetes mellitus, type 1) ([place]) 10/13/2011    Past Surgical History:  Procedure Laterality Date  . ABDOMINAL HYSTERECTOMY  2001  . APPENDECTOMY    . [place]  . CHOLECYSTECTOMY  02/10/2011  Procedure: LAPAROSCOPIC CHOLECYSTECTOMY WITH INTRAOPERATIVE CHOLANGIOGRAM;  Surgeon: Judieth Keens, DO;  Location: [facility];  Service: General;  Laterality: N/A;  . COLONOSCOPY    . DILATION AND CURETTAGE OF UTERUS    . exploratory laparomty  Mar 04, 2012  . EYE SURGERY  2006   laser eye surgery left eye  . KNEE SURGERY Left 2012  . LAPAROSCOPIC APPENDECTOMY N/A 10/12/2012   Procedure: DIAGNOSTIC APPENDECTOMY LAPAROSCOPIC;  Surgeon: Madilyn Hook, DO;  Location: [facility];  Service: General;  Laterality: N/A;  . lipoma removed  yrs ago  . LIVER BIOPSY    . ORIF ANKLE FRACTURE Left 01/03/2016   Procedure: OPEN REDUCTION INTERNAL FIXATION (ORIF) LEFT LISFRANC JOINT,  GASTROC RECESSION;  Surgeon: Newt Minion, MD;  Location: [facility];  Service: Orthopedics;  Laterality: Left;  . ROBOTIC ASSISTED LAPAROSCOPIC LYSIS OF ADHESION N/A 03/04/2012   Procedure: ROBOTIC ASSISTED LAPAROSCOPIC LYSIS OF EXTENSIVE ADHESIONS;  Surgeon: Claiborne Billings A. Pamala Hurry, MD;  Location: [facility];  Service: Gynecology;  Laterality: N/A;  . TONSILLECTOMY  2001    OB History    No data available       Home Medications    Prior to Admission medications   Medication Sig Start Date End Date Taking? Authorizing Provider  acetaminophen (TYLENOL) 500 MG tablet Take 1 tablet (500 mg total) by mouth every 8 (eight) hours as needed for headache. 11/20/15   Arrien, Jimmy Picket, MD  amoxicillin-clavulanate (AUGMENTIN) 875-125 MG tablet Take 1 tablet by mouth every 12 (twelve) hours for 10 days. 01/16/17 01/26/17  Gareth Morgan, MD  atorvastatin (LIPITOR) 40 MG tablet Take 40 mg by mouth at bedtime.     [provider]  baclofen (LIORESAL) 10 MG tablet TK 1 T PO TID 06/15/16   [provider]  cholecalciferol (VITAMIN D) 1000 units tablet Take 50,000 Units by mouth every Wednesday.    [provider]  citalopram (CELEXA) 20 MG tablet Take 1 tablet (20 mg total) by mouth daily. 05/15/16   Nche, Charlene Brooke, NP  gabapentin (NEURONTIN) 300 MG capsule Take 300 mg by mouth daily as needed (for pain).  12/11/15   [provider]  insulin aspart (NOVOLOG) 100 UNIT/ML injection Inject 8 Units into the skin 3 (three) times daily with meals. 03/05/16   [place], FNP  insulin aspart protamine - aspart (NOVOLOG 70/30 MIX) (70-30) 100 UNIT/ML FlexPen Inject into the skin.    [provider]  insulin glargine (LANTUS) 100 UNIT/ML injection Inject 0.28 mLs (28 Units total) into the skin at bedtime. Patient taking differently: Inject 30 Units into the skin at bedtime.  03/05/16   [place], FNP  lidocaine (LIDODERM) 5 % Place 1 patch onto the skin daily as  needed (pain). Remove after 12 hours. 10/30/15   [provider]  ondansetron (ZOFRAN-ODT) 4 MG disintegrating tablet TK 1 T PO Q 8 H PRN NAUSEA FOR UP TO 7 DAYS 06/15/16   [provider]  promethazine (PHENERGAN) 25 MG tablet Take 1 tablet (25 mg total) by mouth every 6 (six) hours as needed for nausea. 06/17/16   Clayton Bibles, PA-C  Suvorexant (BELSOMRA) 10 MG TABS Take 1 tablet by mouth daily. 05/15/16   Nche, Charlene Brooke, NP  tiZANidine (ZANAFLEX) 4 MG tablet TAKE ONE TABLET BY MOUTH EVERY 6 HOURS AS NEEDED FOR MUSCLE SPASM 06/09/16   [provider]  traMADol (ULTRAM) 50 MG tablet TK 1 T PO Q 8 H PRN P 06/22/16  [provider]  warfarin (COUMADIN) 5 MG tablet TAKE 1 TABLET(5 MG) BY MOUTH DAILY AT 6 PM Patient taking differently: TAKE 1 TABLET(6 MG) BY MOUTH DAILY AT 6 PM 07/06/16   [place], Holli Humbles, NP    Family History Family History  Problem Relation Age of Onset  . Cancer Father        lymphoma  . Nephrolithiasis Father   . Vascular Disease Father   . Alcohol abuse Father   . Drug abuse Father   . Cancer Maternal Grandmother        colon  . Hypertension Mother   . Heart disease Mother   . Cataracts Mother   . Depression Mother   . Diabetes Brother   . Drug abuse Brother   . Mental illness Maternal Grandfather   . Cancer Maternal Grandfather   . Heart disease Paternal Grandmother   . Heart disease Paternal Grandfather   . Suicidality Maternal Uncle     Social History Social History   Tobacco Use  . Smoking status: Never Smoker  . Smokeless tobacco: Never Used  Substance Use Topics  . Alcohol use: Yes    Comment: Socially  . Drug use: No     Allergies   Cephalexin; Ibuprofen; and Meloxicam   Review of Systems Review of Systems  All other systems reviewed and are negative.    Physical Exam Updated Vital Signs BP 114/69 (BP Location: Left Arm)   Pulse 89   Temp 98.4 F (36.9 C) (Oral)   Resp 18   SpO2 100%    Physical Exam  Constitutional: She is oriented to person, place, and time. She appears well-developed and well-nourished. No distress.  HENT:  Head: Atraumatic.  Ears: R TM retracted, purulent discharge noted inferiorly, TM mildly erythematous and dull.  L TM is mildly retracted but non purulent and intact.  Normal ear canal Nose: rhinorrhea Throat: uvula midline no tonsilar enlargement or exudates.  Mild dental decay without obvious dental abscess.   Mild maxillary sinus tenderness on percussion bilaterally Voice is hoarsed  Eyes: Conjunctivae are normal.  Neck: Normal range of motion. Neck supple.  No nuchal rigidity  Cardiovascular: Normal rate and regular rhythm.  Pulmonary/Chest: Effort normal. She has wheezes (very faint end phase expiratory wheezes).  Abdominal: Soft. She exhibits no distension. There is no tenderness.  Neurological: She is alert and oriented to person, place, and time.  Skin: No rash noted.  Psychiatric: She has a normal mood and affect.  Nursing note and vitals reviewed.    ED Treatments / Results  Labs (all labs ordered are listed, but only abnormal results are displayed) Labs Reviewed - No data to display  EKG  EKG Interpretation None       Radiology No results found.  Procedures Procedures (including critical care time)  Medications Ordered in ED Medications - No data to display   Initial Impression / Assessment and Plan / ED Course  I have reviewed the triage vital signs and the nursing notes.  Pertinent labs & imaging results that were available during my care of the patient were reviewed by me and considered in my medical decision making (see chart for details).     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  She is hoarsed, her ears appears infected and she has faint wheezes on exam. Plan to stasrt a course of prednisone but encourage close f/u with ENT for further care.  Pt made aware prednisone can affect her CBG due to hx of diabetes.    Domenic Moras, PA-C 01/24/17 1611    Charlesetta Shanks, MD 02/02/17 [id number]

## 2017-01-24 NOTE — ED Triage Notes (Signed)
Pt c/o URI and cough, ear infection, pt was given Augmentin 01/16/2017 which she hasn't finished, pt states she does not see improvement.

## 2017-01-24 NOTE — ED Notes (Signed)
Pt is frustrated because she reports this is her third ED visit and feels like she is just going around in circles. Pt is unhappy at how long this problem is persisting and feels as if she is not being taken seriously when it comes to the pain in her ears and head.

## 2017-01-24 NOTE — Discharge Instructions (Signed)
Please continue taking augmentin as previously prescribed.  Take prednisone to help with your hoarseness.  Follow up with ENT specialist for further care.  Monitor your blood sugar closely while on prednisone as it can cause a spike in your blood sugar.

## 2017-02-11 IMAGING — DX DG LUMBAR SPINE COMPLETE 4+V
5 series · 5 of 5 positions shown · non-contrast
Comparison: None.

CLINICAL DATA: Lumbago with lower extremity radicular symptoms.
Fall 2 weeks prior

EXAM:
LUMBAR SPINE - COMPLETE 4+ VIEW

[l-spine ap (1 of 3)]
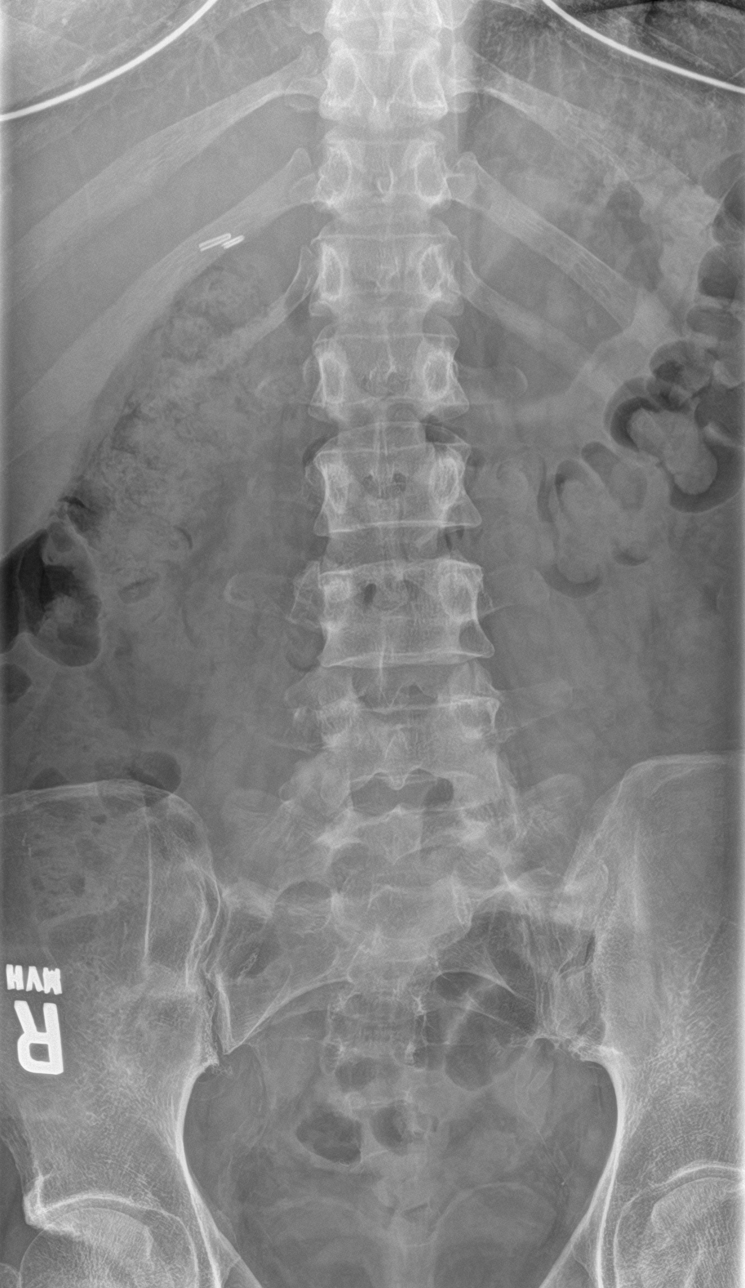

[l-spine lat]
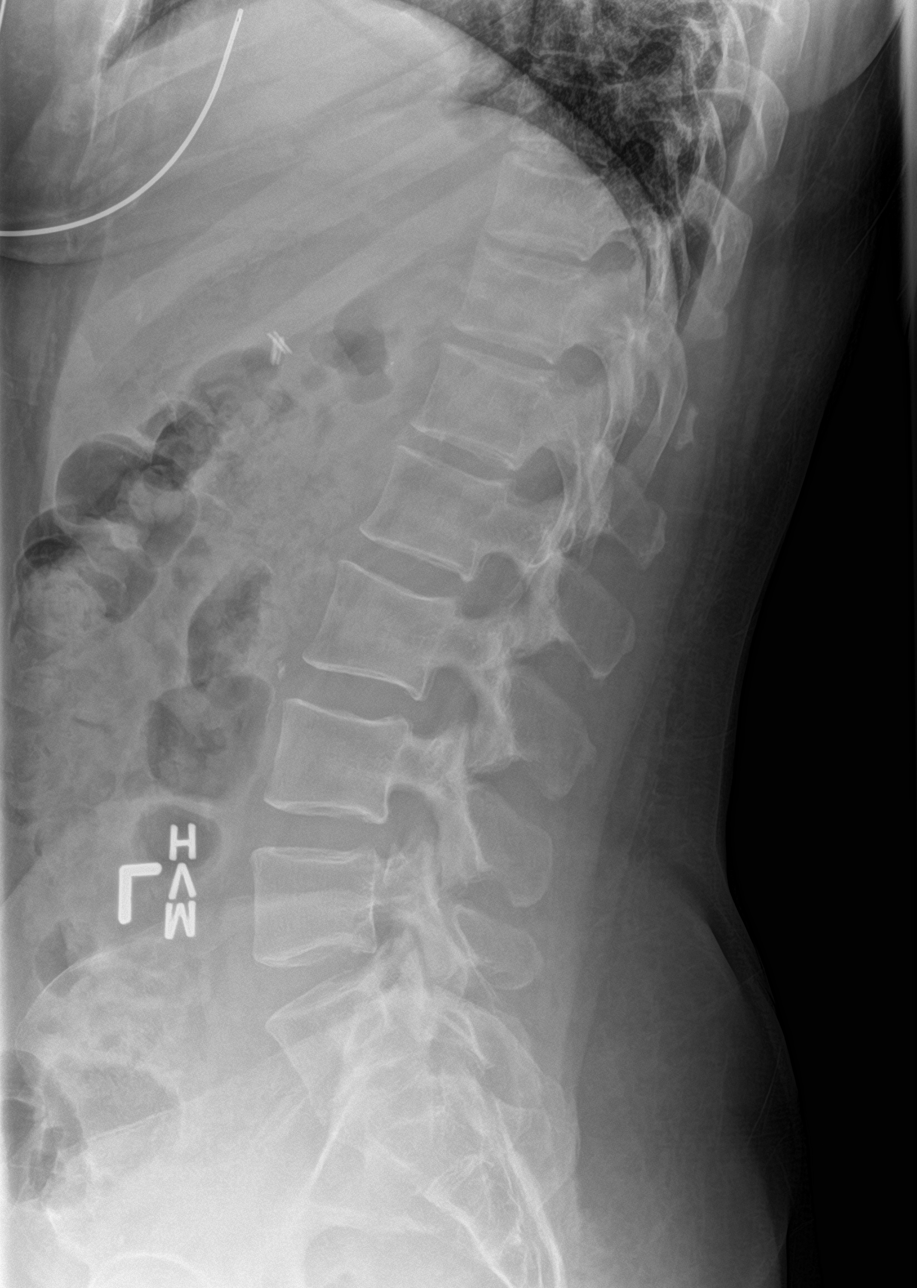

[l-spine spot]
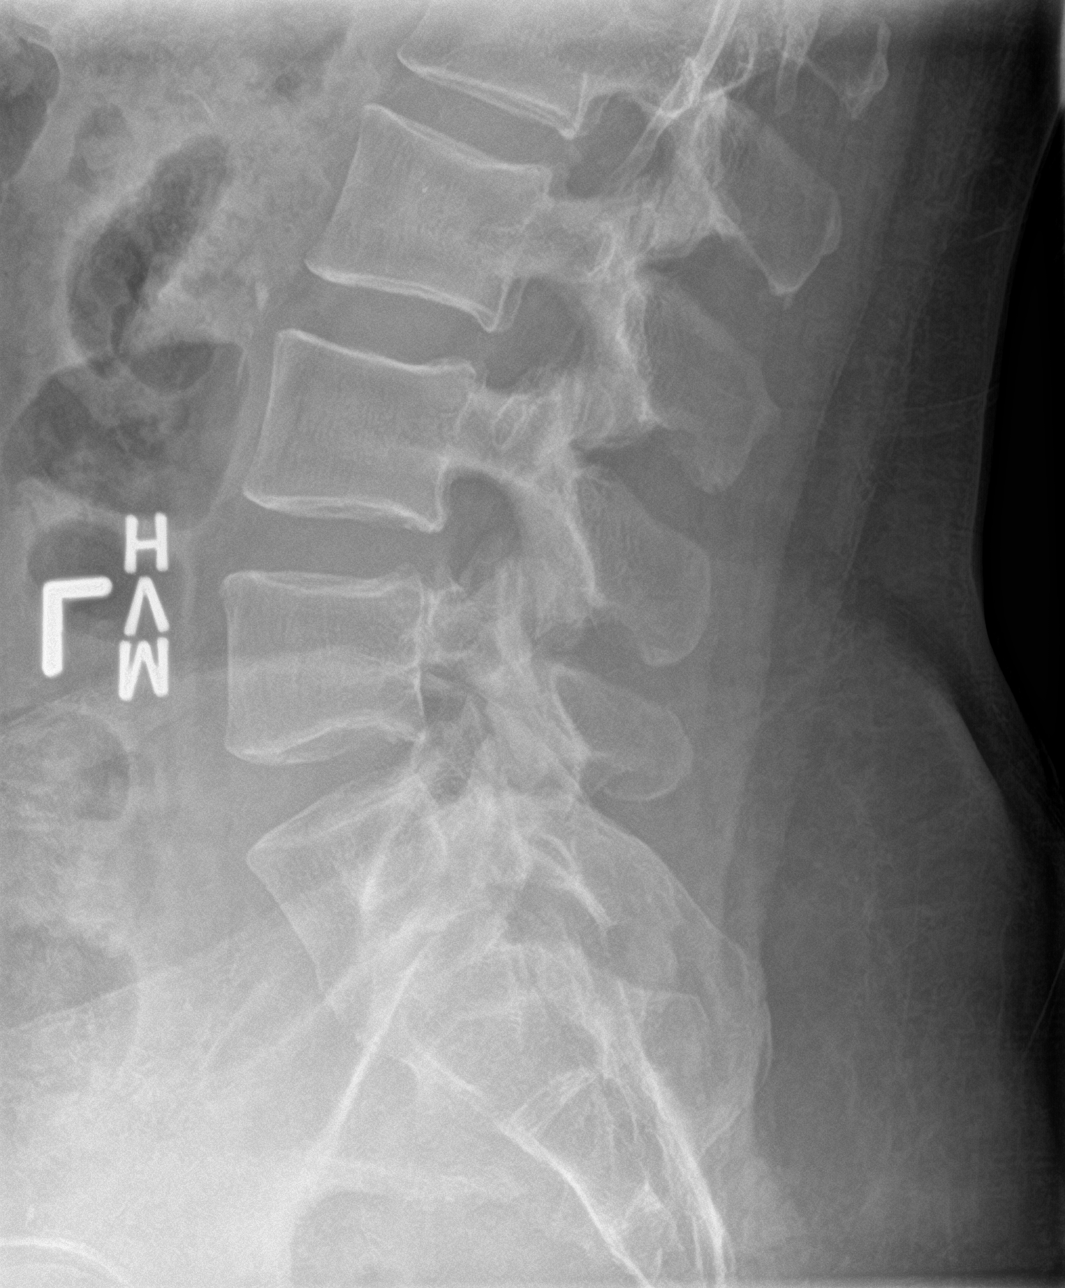

[l-spine ap (2 of 3)]
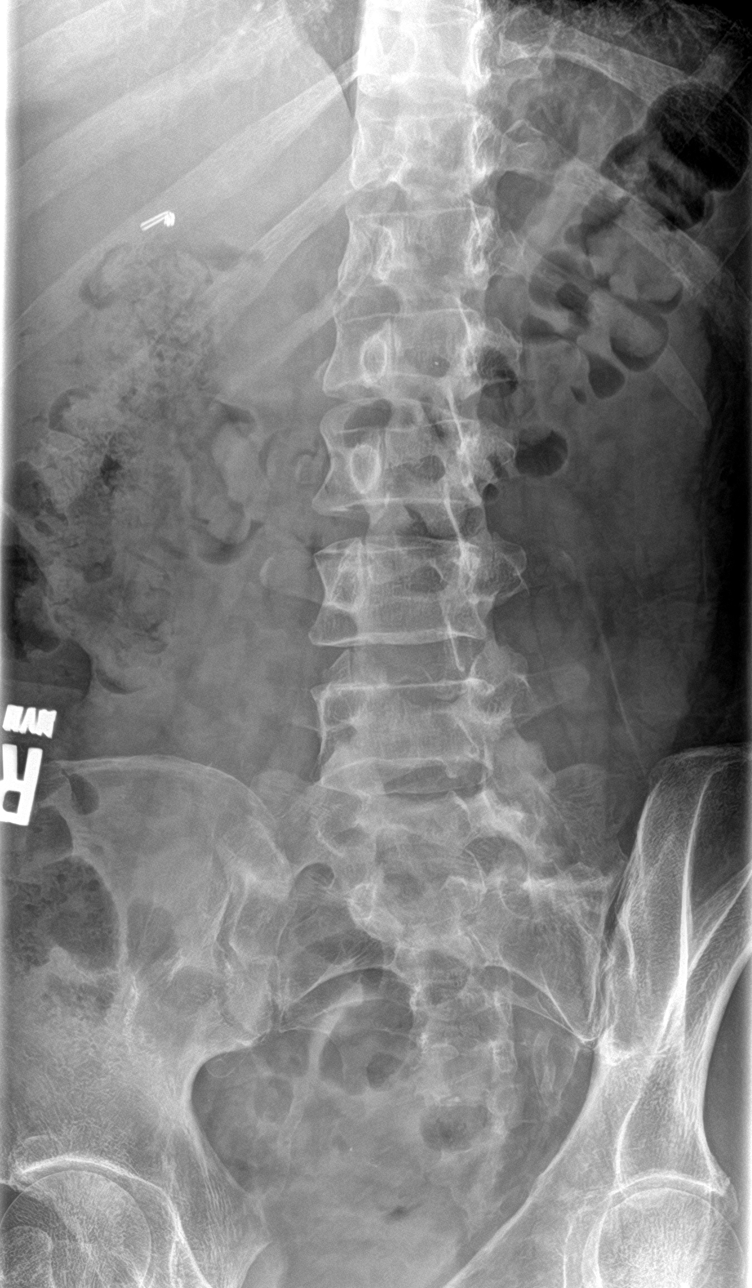

[l-spine ap (3 of 3)]
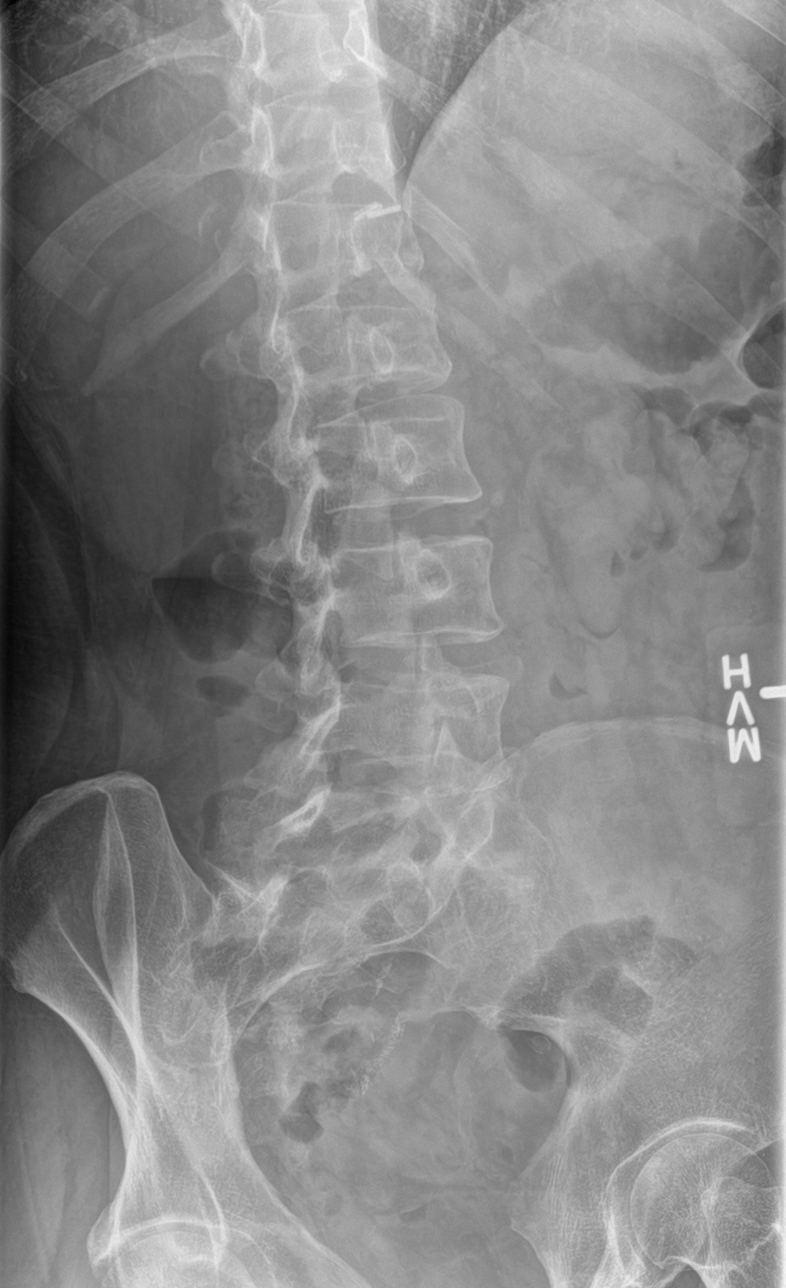

[5 of 5 positions shown; findings below may reference images not displayed]

FINDINGS: Frontal, lateral, spot lumbosacral lateral, and bilateral oblique
views were obtained. There are 4 strictly non-rib-bearing lumbar
type vertebral bodies. There is a transitional S1 vertebra. There is
no fracture or spondylolisthesis. Disc spaces appear normal. There
is no appreciable facet arthropathy. There are scattered foci of
atherosclerotic calcification in the aorta.
IMPRESSION: No fracture or spondylolisthesis. No appreciable arthropathic
change. Scattered foci of atherosclerotic calcification noted.

## 2017-04-28 IMAGING — CR DG CHEST 2V
2 series · 2 of 2 positions shown · non-contrast
Comparison: CT chest 01/19/2015.  PA and lateral chest 01/18/2015.

CLINICAL DATA: Chest pain, dizziness, lightheadedness and
near-syncope today.

EXAM:
CHEST  2 VIEW

[w chest pa]
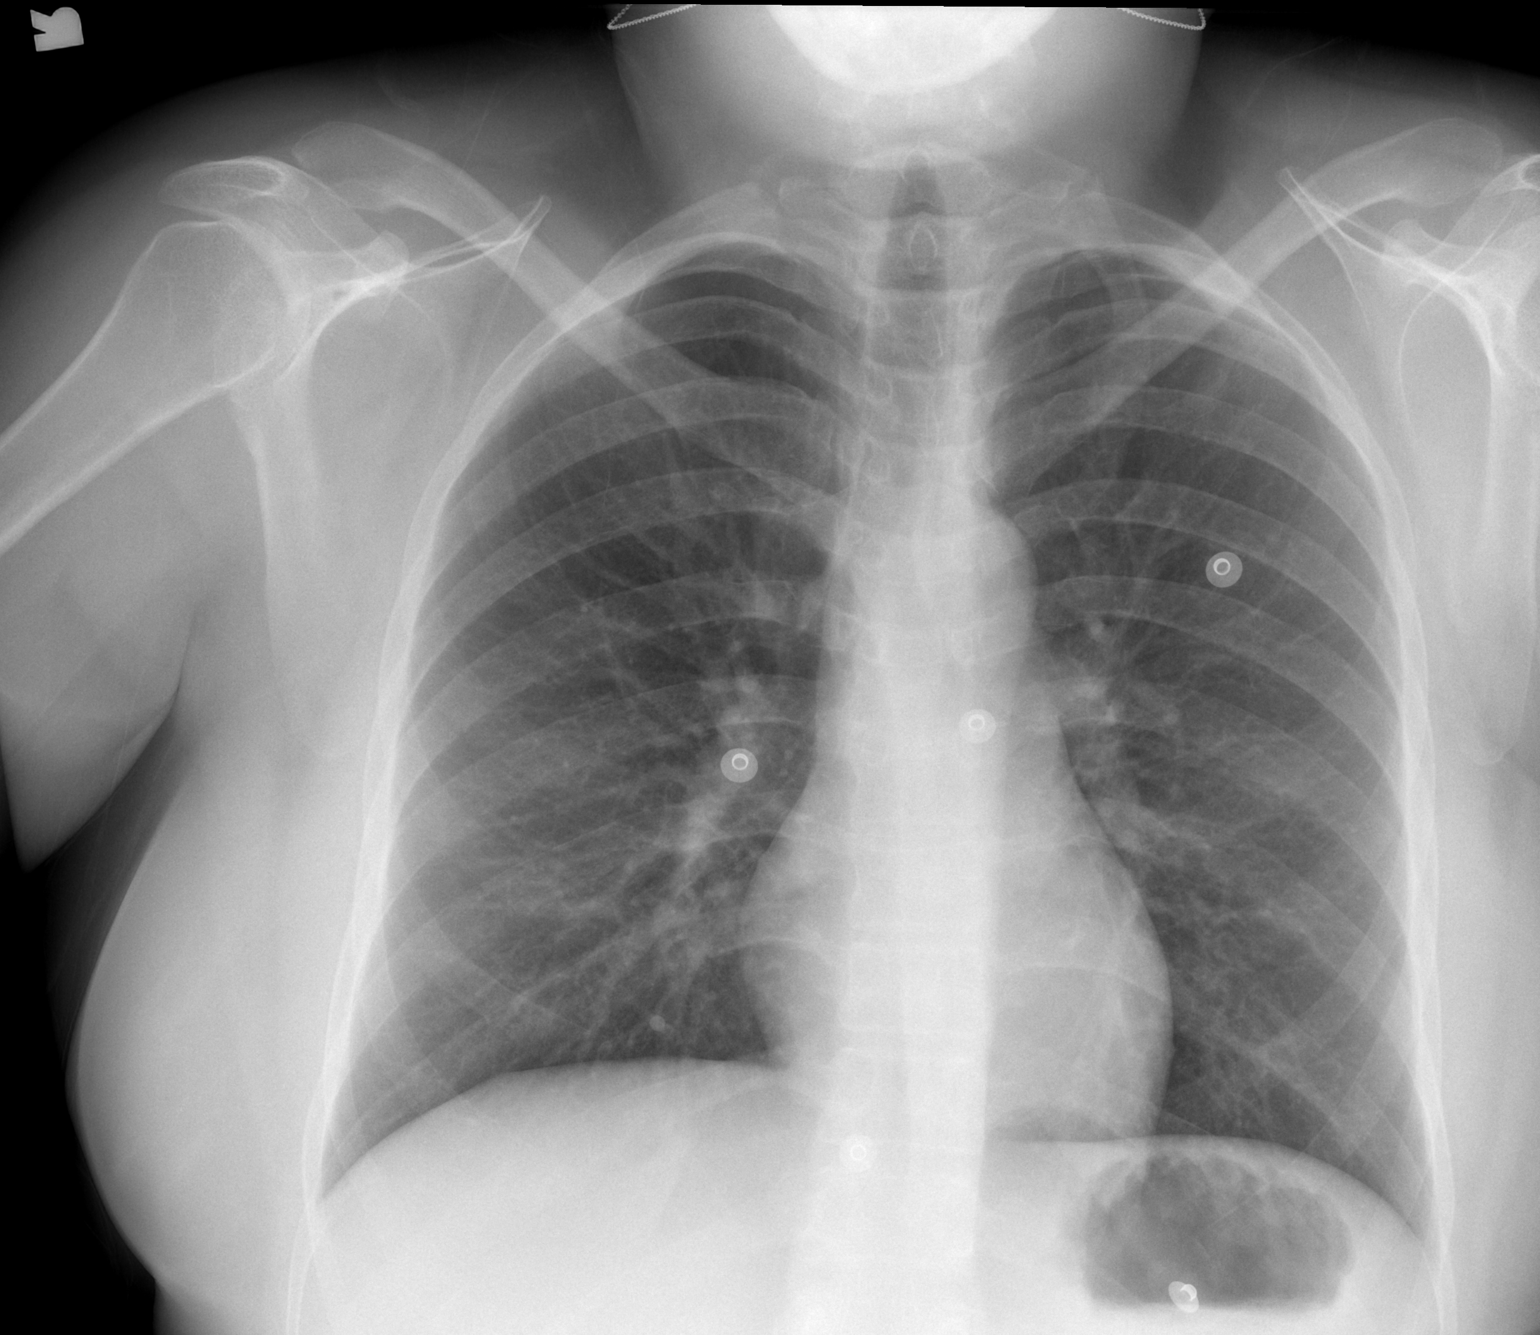

[w chest lat]
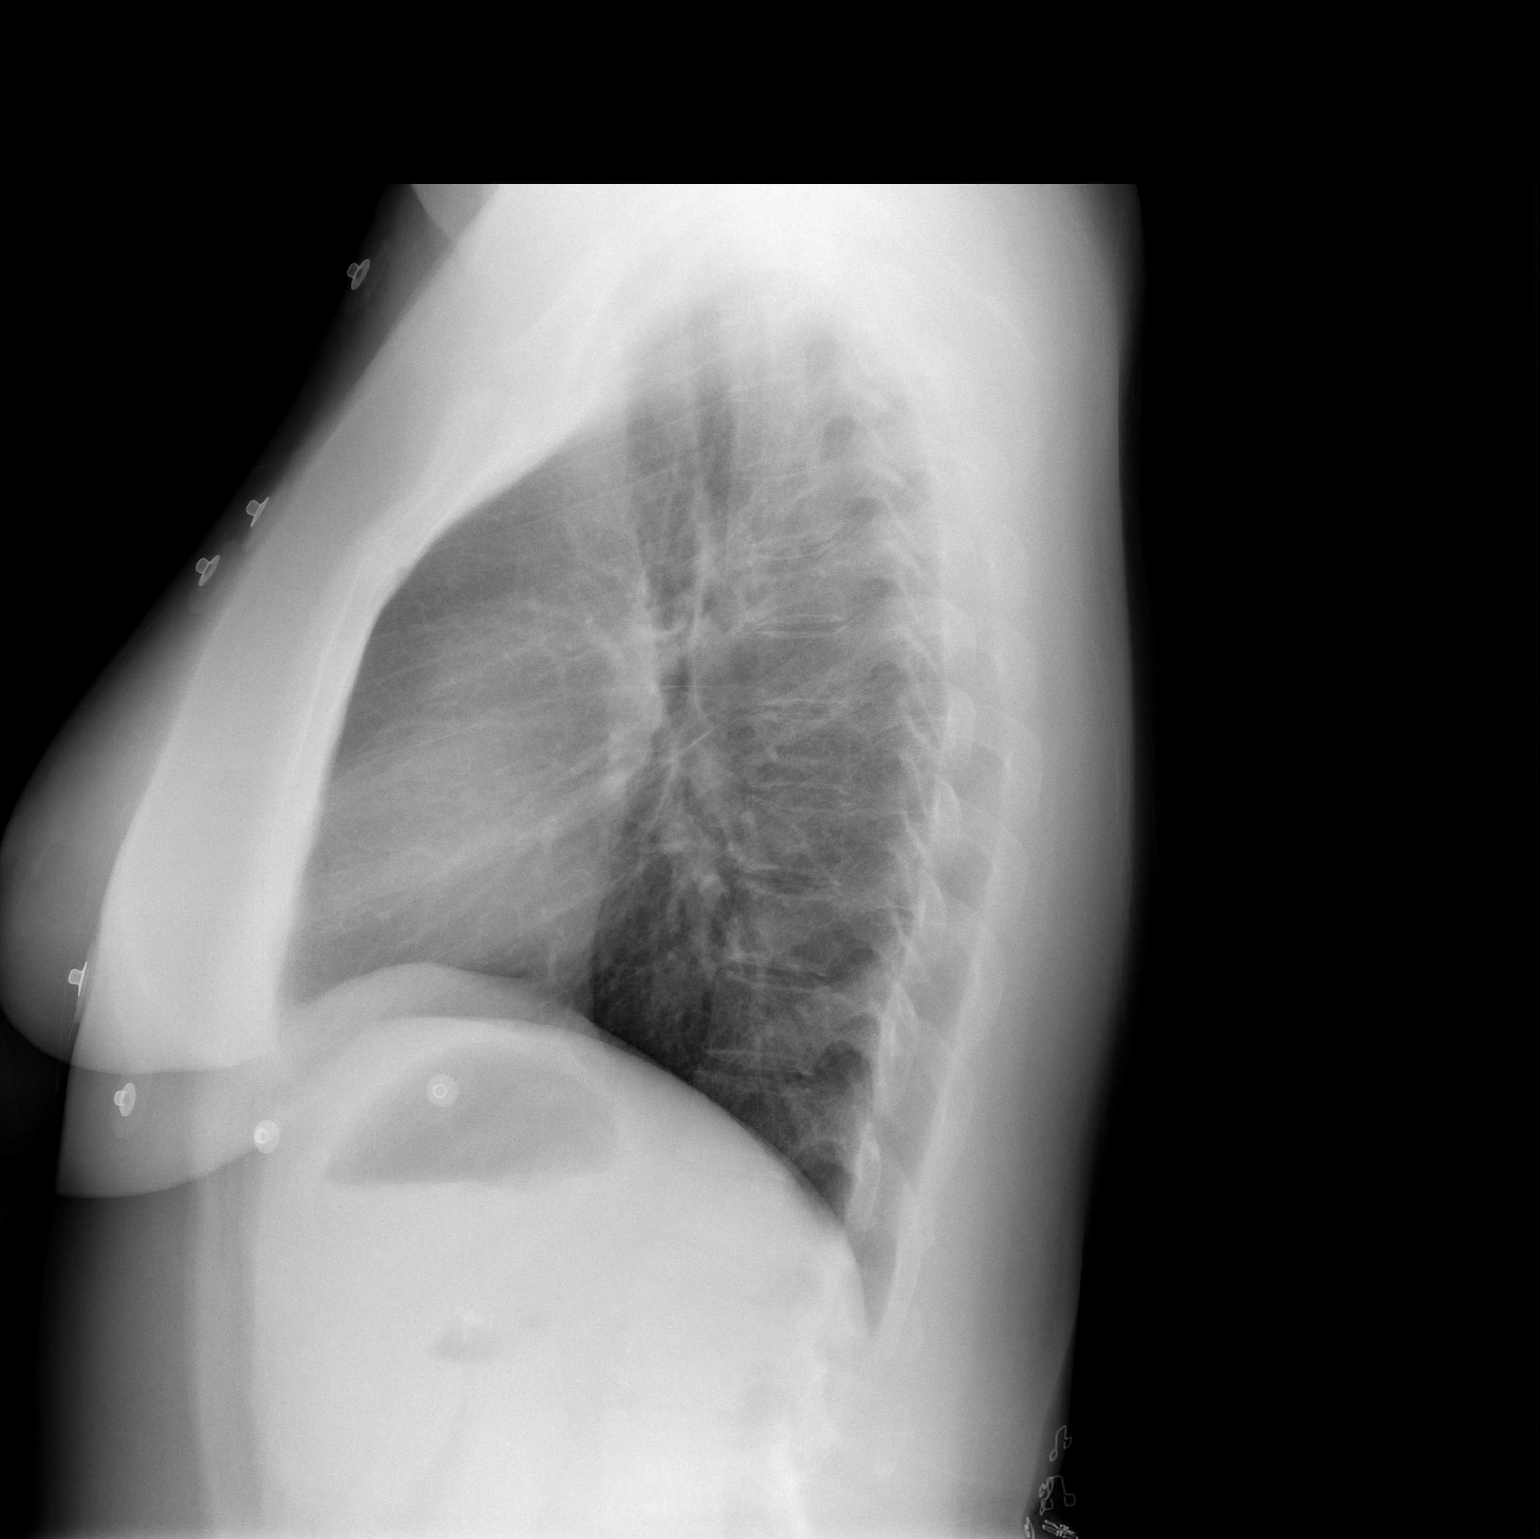

[2 of 2 positions shown; findings below may reference images not displayed]

FINDINGS: The lungs are clear. Heart size is normal. No pneumothorax or
pleural effusion.
IMPRESSION: Normal chest.

## 2017-04-28 IMAGING — CT CT ANGIO CHEST
2 of 8 series · 19 of 36 positions shown · IV contrast (isovue)
Comparison: 01/19/2015

CLINICAL DATA: Mid chest pain and concern for pulmonary embolism.
Difficulty breathing. History of prior pulmonary embolism.

EXAM:
CT ANGIOGRAPHY CHEST WITH CONTRAST
TECHNIQUE: Multidetector CT imaging of the chest was performed using the
standard protocol during bolus administration of intravenous
contrast. Multiplanar CT image reconstructions and MIPs were
obtained to evaluate the vascular anatomy.
CONTRAST:  100 cc Isovue 370 intravenous

[Series 6: pe thins · axial · 0.56mm/px · z∈[-254,-22]mm · 18 of 260 slices shown]
[im 14/260  lung]
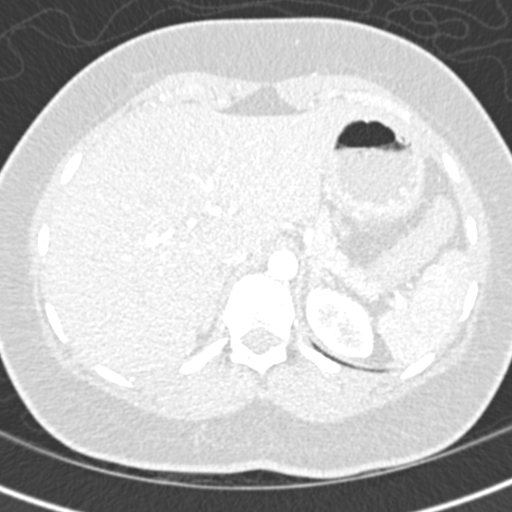
[im 28/260  mediastinal]
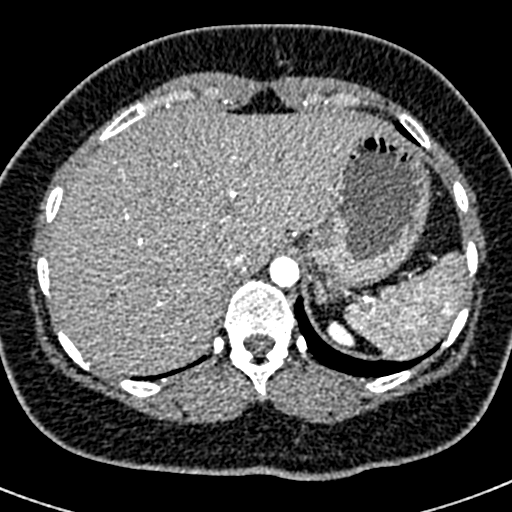
[im 41/260  lung]
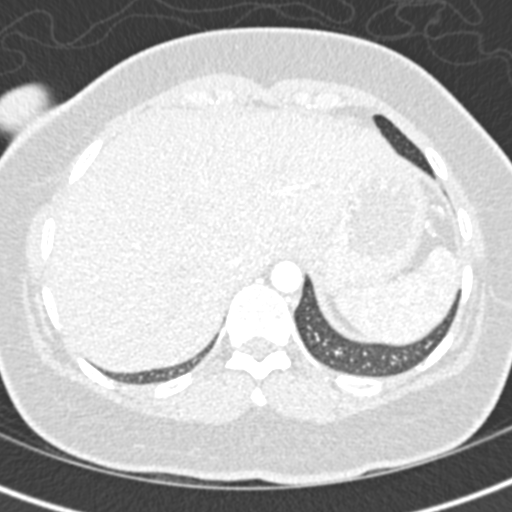
[im 55/260  mediastinal]
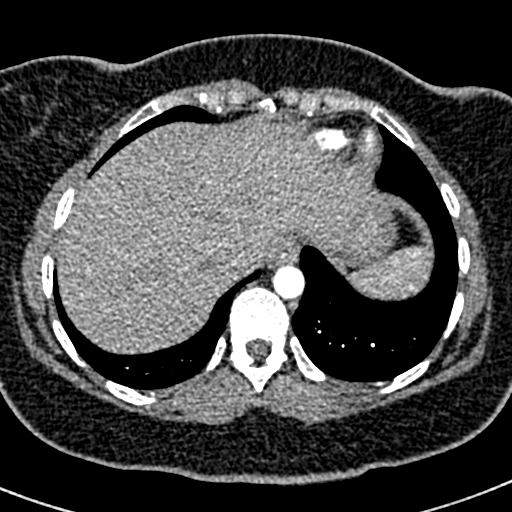
[im 69/260  lung]
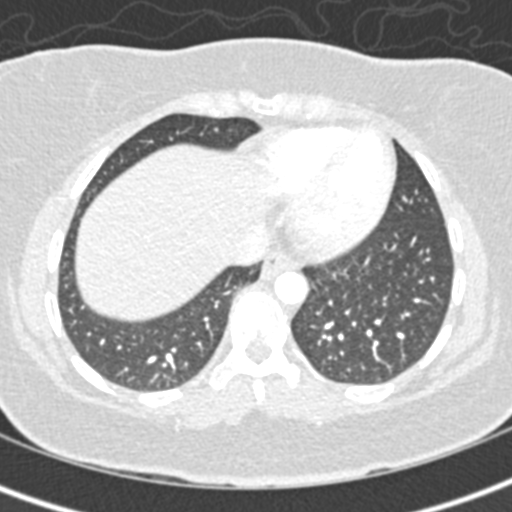
[im 82/260  mediastinal]
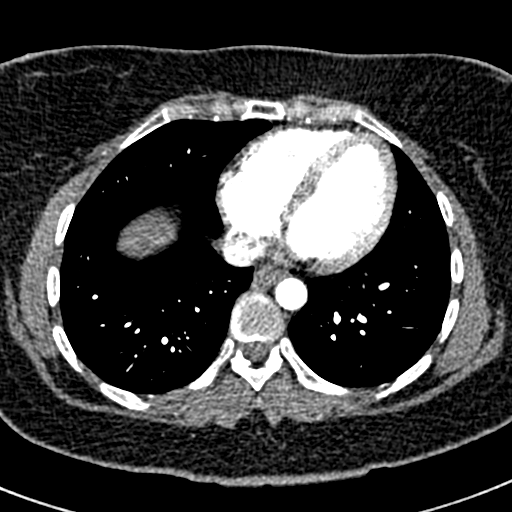
[im 96/260  lung]
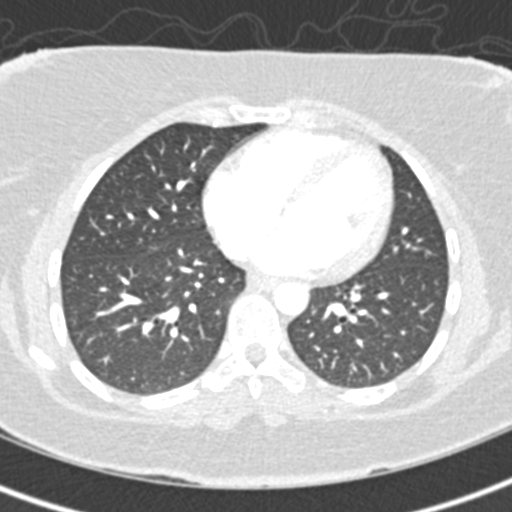
[im 110/260  mediastinal]
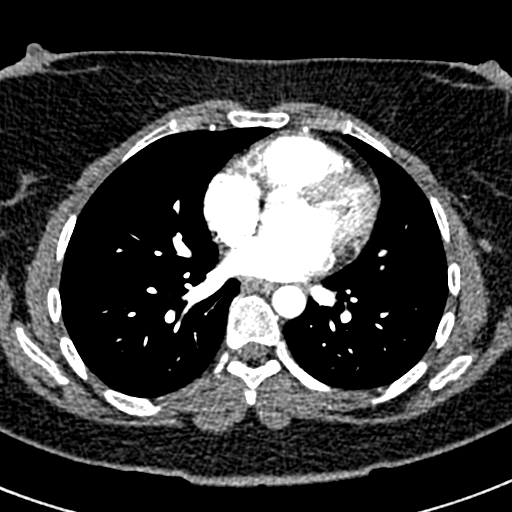
[im 123/260  lung]
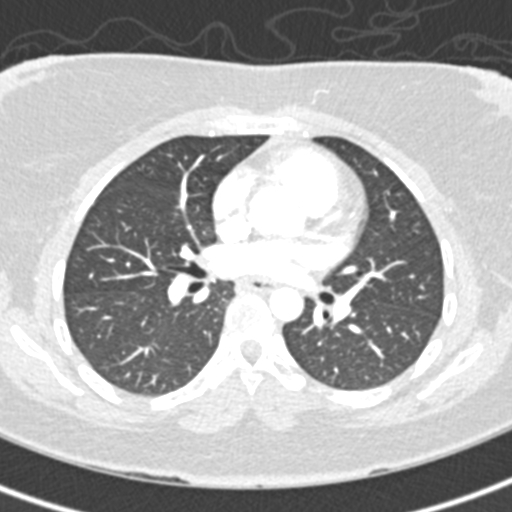
[im 137/260  mediastinal]
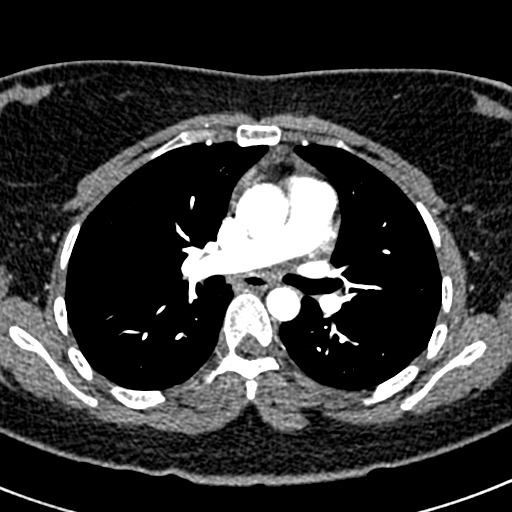
[im 150/260  lung]
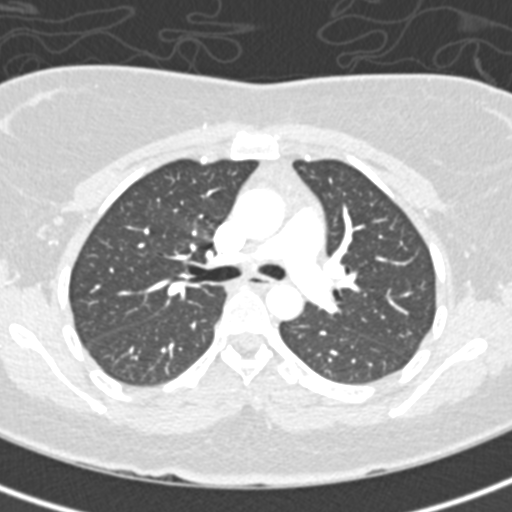
[im 164/260  mediastinal]
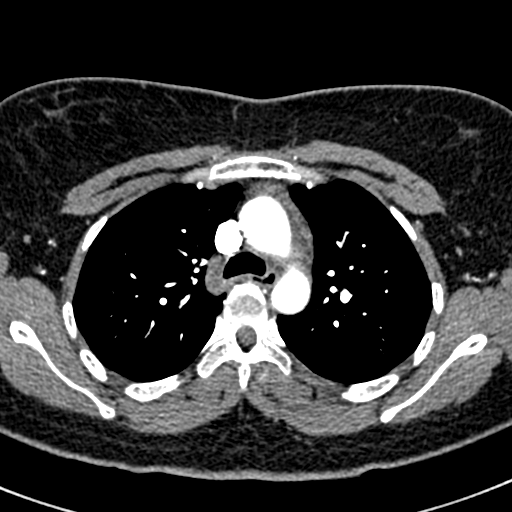
[im 178/260  lung]
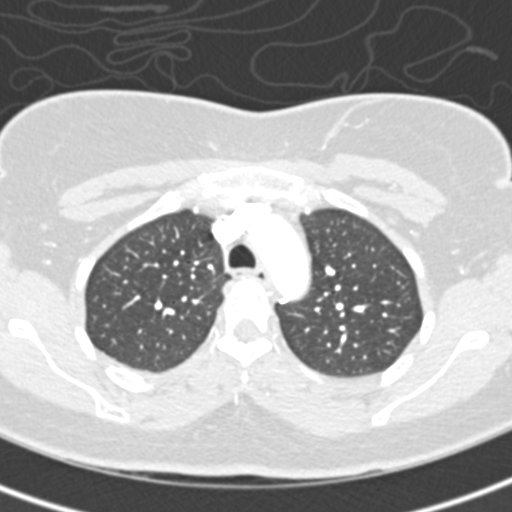
[im 191/260  mediastinal]
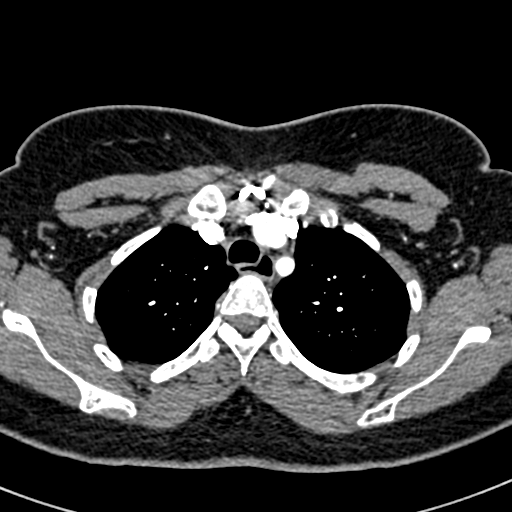
[im 205/260  lung]
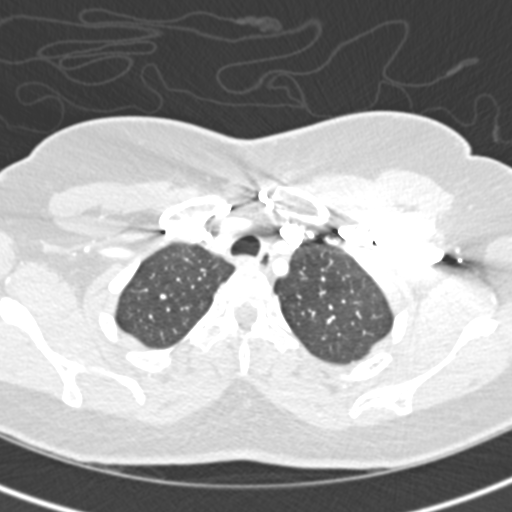
[im 219/260  mediastinal]
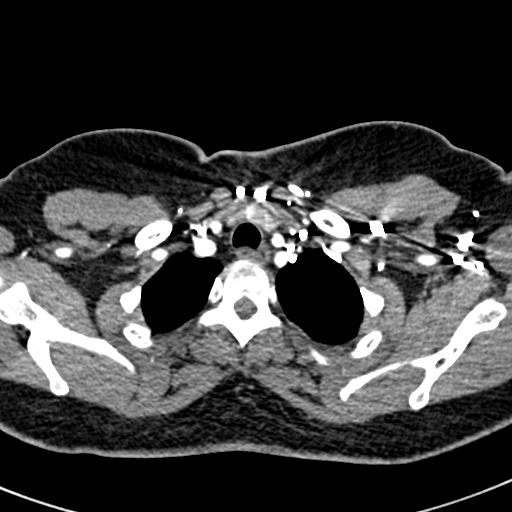
[im 232/260  lung]
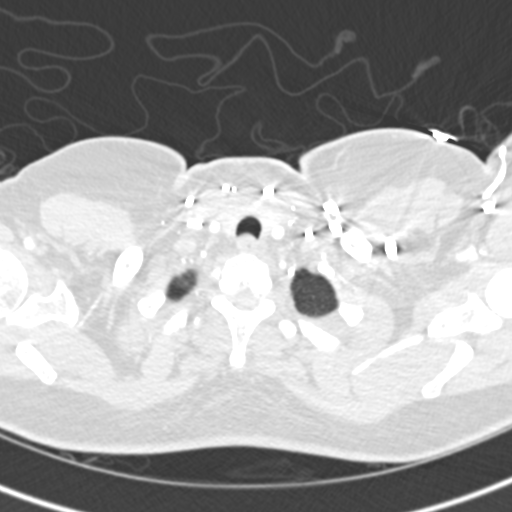
[im 246/260  mediastinal]
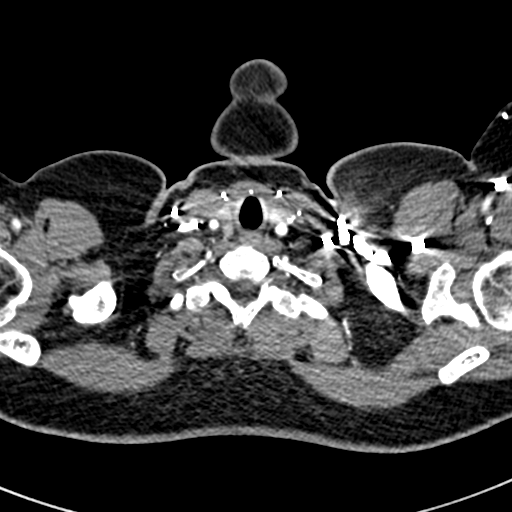

[Series 7: pe coronal mpr · coronal · 0.55mm/px · 1 of 103 slices shown]
[im 52/103  mediastinal]
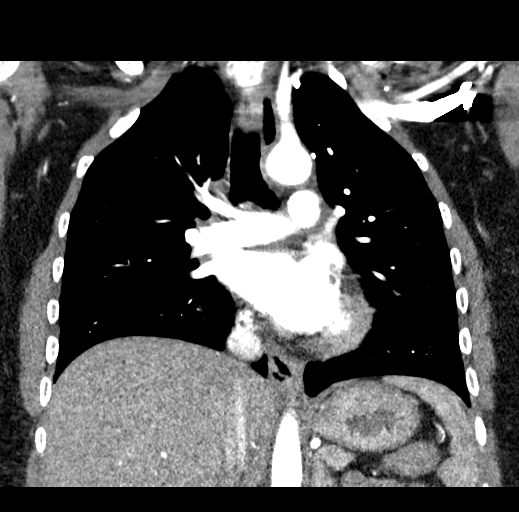

[19 of 36 positions shown; findings below may reference images not displayed]

FINDINGS: THORACIC INLET/BODY WALL:

No acute abnormality.

MEDIASTINUM:

Normal heart size. No pericardial effusion. No evidence of acute
pulmonary embolism. Possibility of a web from previous embolism in
the left lower lobe, [DATE]. Normal thoracic aorta. No adenopathy.

LUNG WINDOWS:

There is no edema, consolidation, effusion, or pneumothorax.

UPPER ABDOMEN:

Negative.

OSSEOUS:

No acute fracture.  No suspicious lytic or blastic lesions.

Review of the MIP images confirms the above findings.
IMPRESSION: No evidence of acute pulmonary embolism. No explanation for chest
pain.

## 2017-05-16 IMAGING — US US RENAL
1 series · 14 of 25 positions shown · non-contrast
Comparison: CT, 09/03/2014

CLINICAL DATA: Left flank pain beginning this morning. History of
renal stones.

EXAM:
RENAL / URINARY TRACT ULTRASOUND COMPLETE

[Series 1: us renal · 0.18mm/px · 14 of 41 slices shown]
[im 1/41]
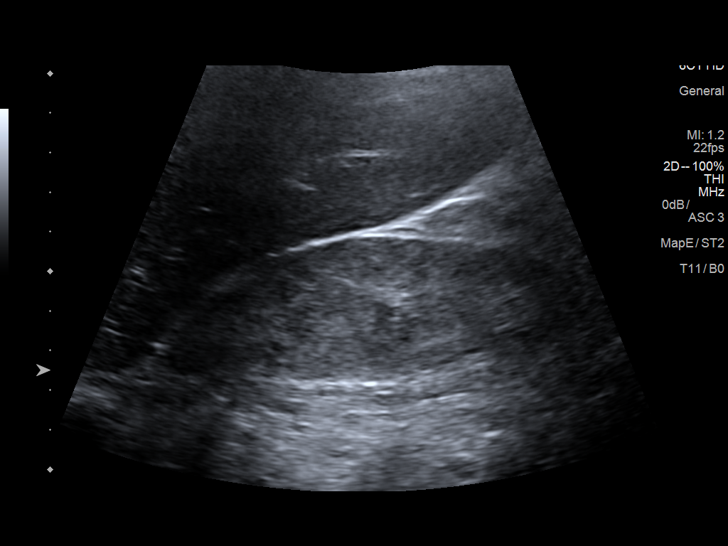
[im 4/41]
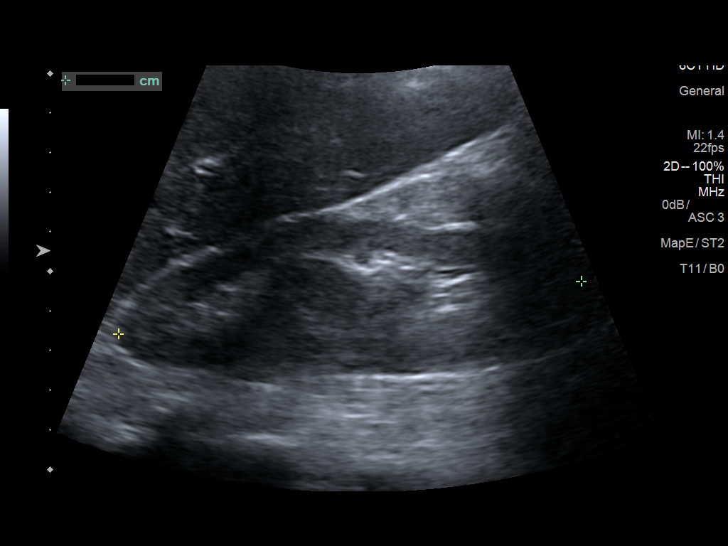
[im 7/41]
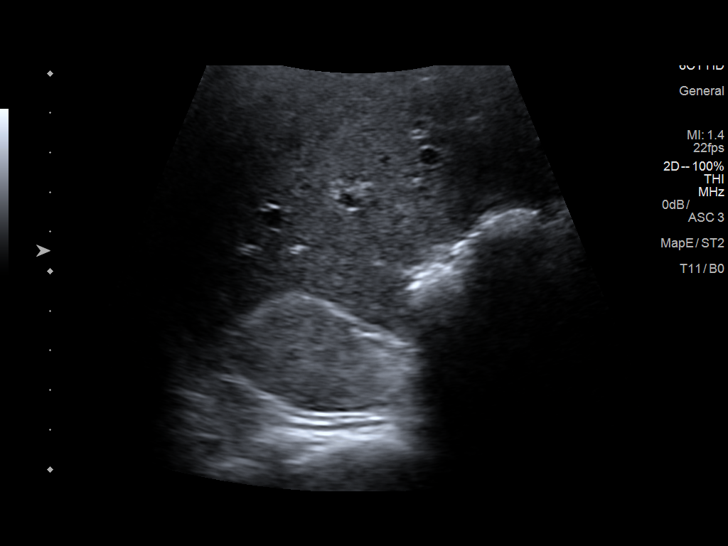
[im 11/41]
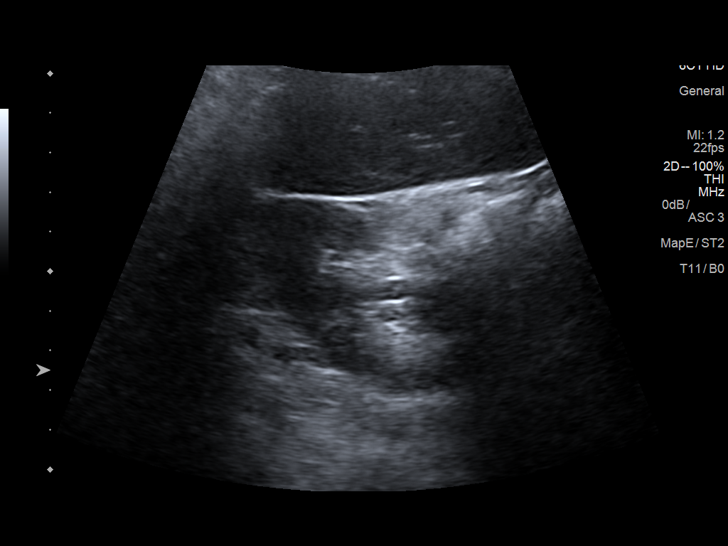
[im 14/41]
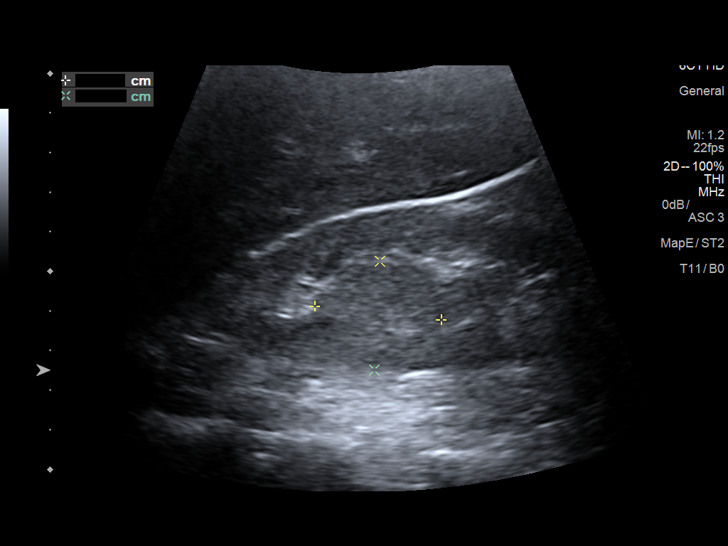
[im 16/41]
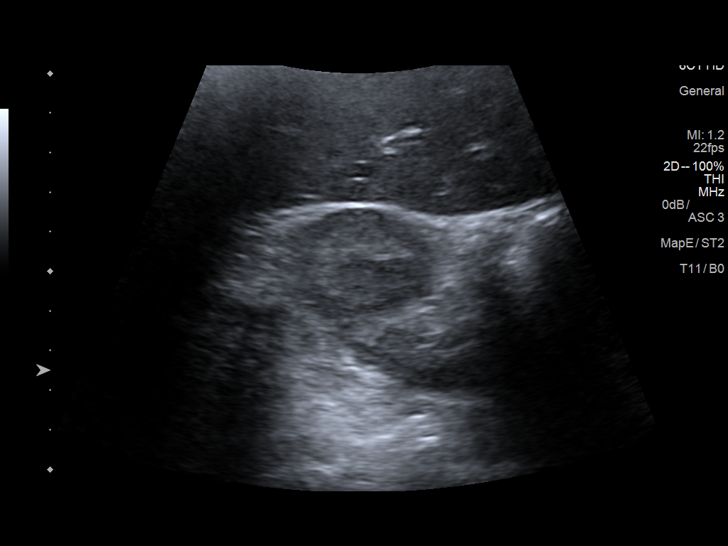
[im 19/41]
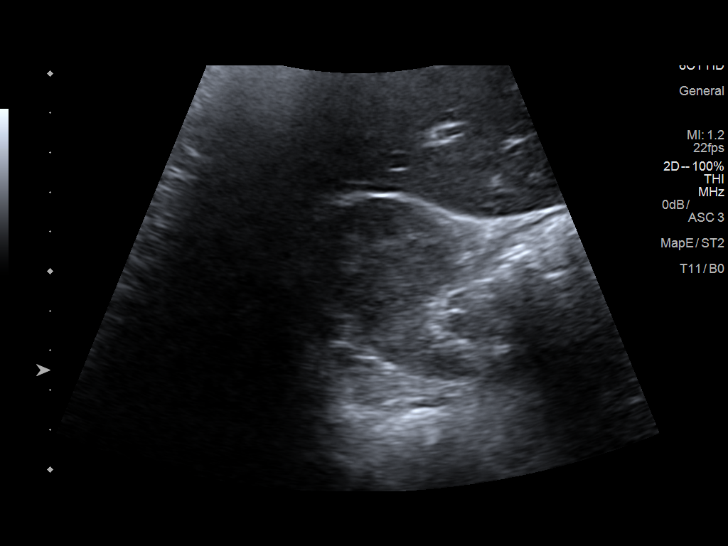
[im 22/41]
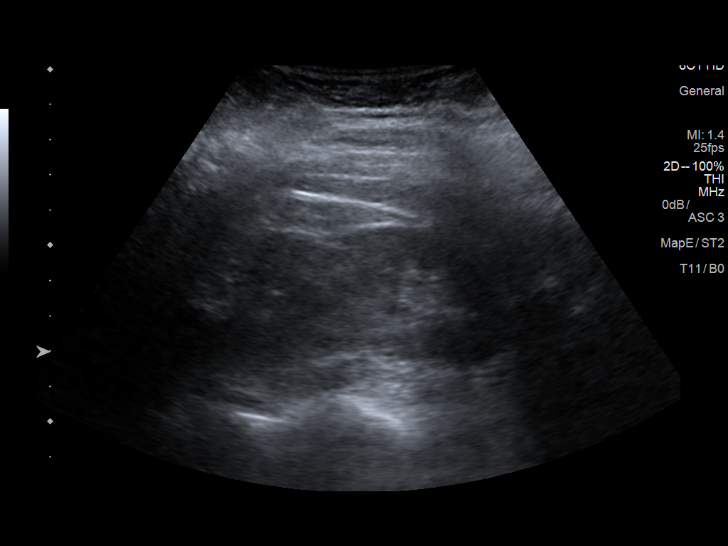
[im 26/41]
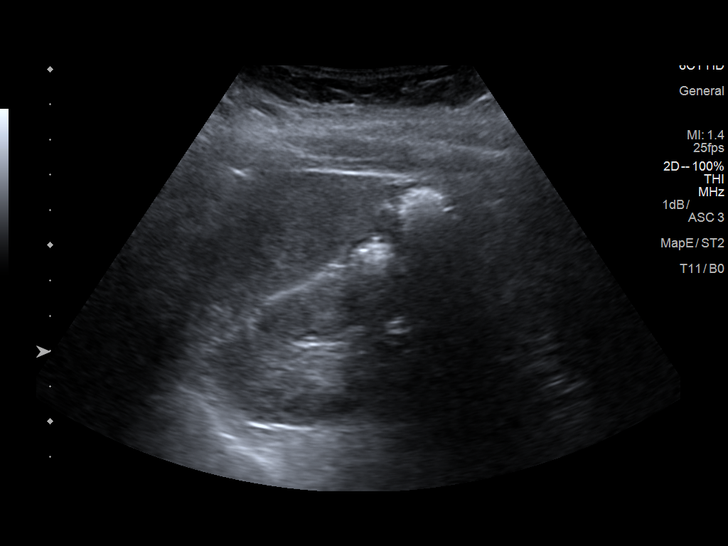
[im 27/41]
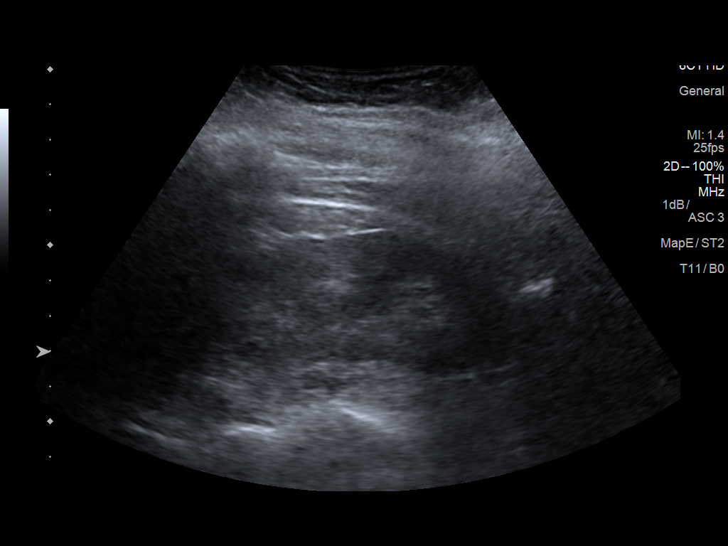
[im 31/41]
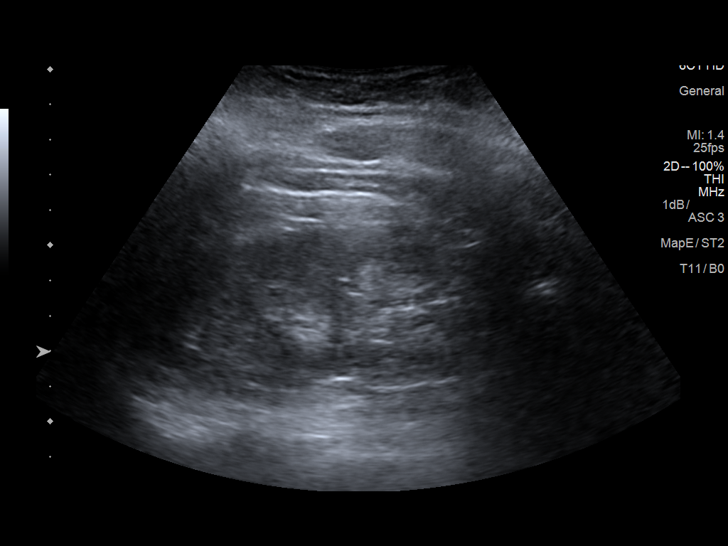
[im 34/41]
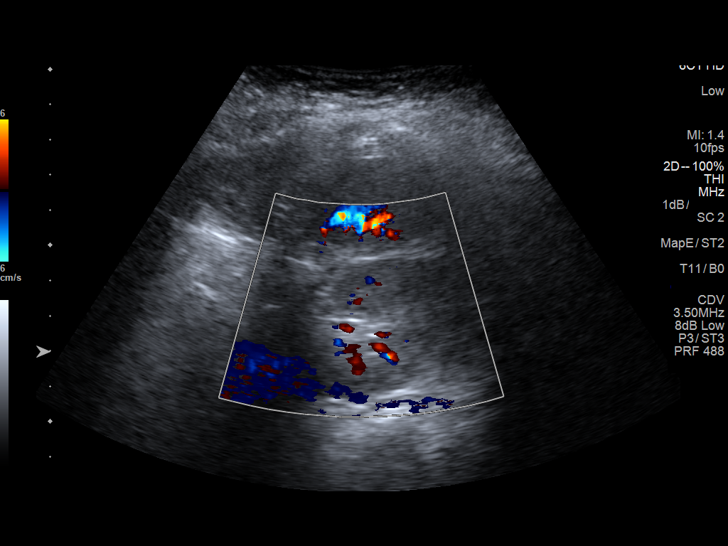
[im 37/41]
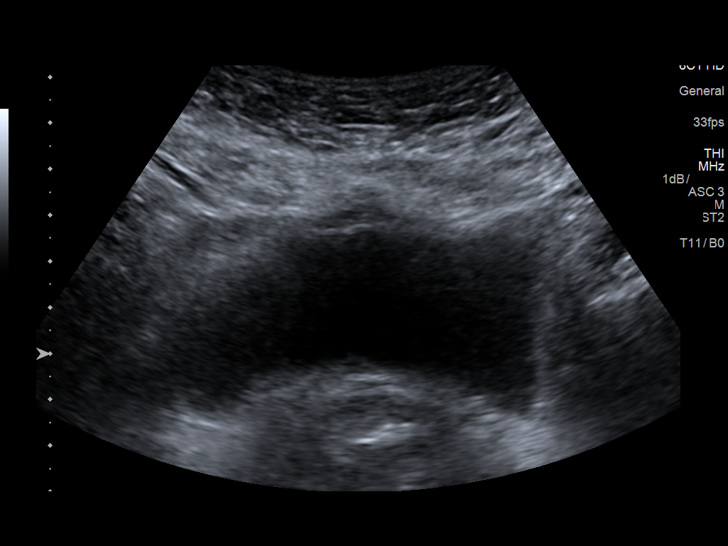
[im 41/41]
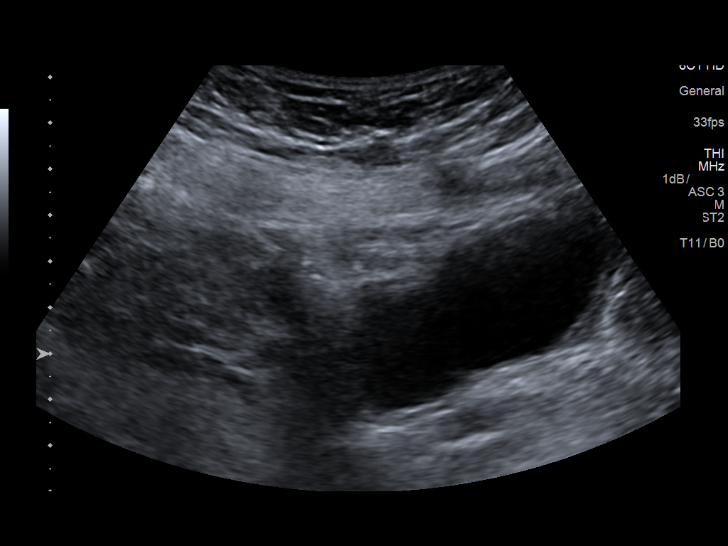

[14 of 25 positions shown; findings below may reference images not displayed]

FINDINGS: Right Kidney:

Length: 11.8 cm. There is a vague round area of relative increased
echogenicity in the central aspect of the right kidney suggesting a
solid mass, measuring approximately 3.3 cm in greatest dimension.
This is most likely a prominent area of normal parenchyma given that
there was no mass on the prior CT. The kidneys are under rotated
based on the prior CT. There is no stone no hydronephrosis.

Left Kidney:

Length: 10.4 cm. Echogenicity within normal limits. No mass or
hydronephrosis visualized.

Bladder:

Appears normal for degree of bladder distention.
IMPRESSION: 1. No renal stones. No hydronephrosis. No findings to explain left
flank pain.
2. Vague rounded area of relative increased echogenicity in the
right kidney centrally. This is most likely normal parenchyma.
Consider follow-up nonemergent abdomen CT with contrast versus
short-term follow-up renal ultrasound with repeat study in 3-4
months.

## 2017-06-01 IMAGING — CR DG KNEE COMPLETE 4+V*L*
4 series · 4 of 4 positions shown · non-contrast
Comparison: None.

CLINICAL DATA: 44-year-old female with left knee pain.

EXAM:
LEFT KNEE - COMPLETE 4+ VIEW

[t knee ap left]
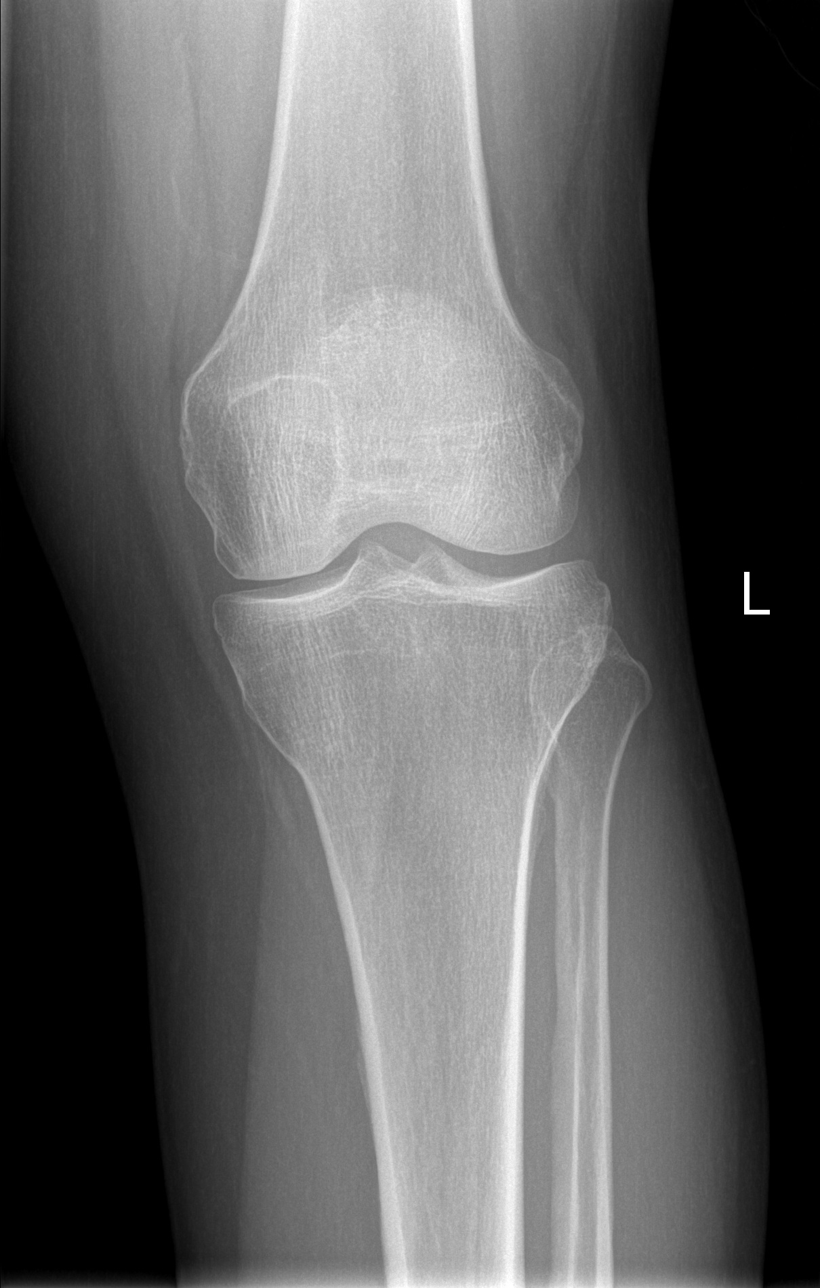

[t knee oblique left (1 of 2)]
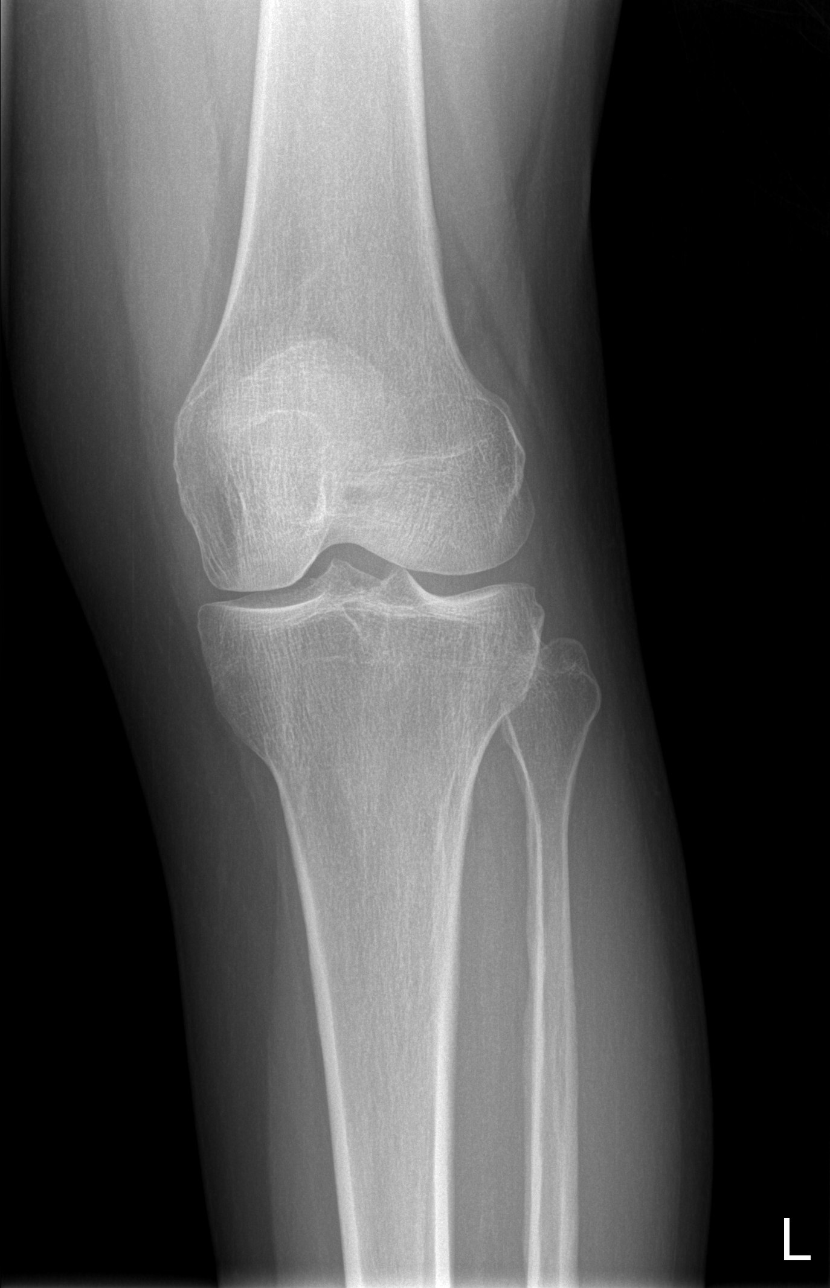

[t knee oblique left (2 of 2)]
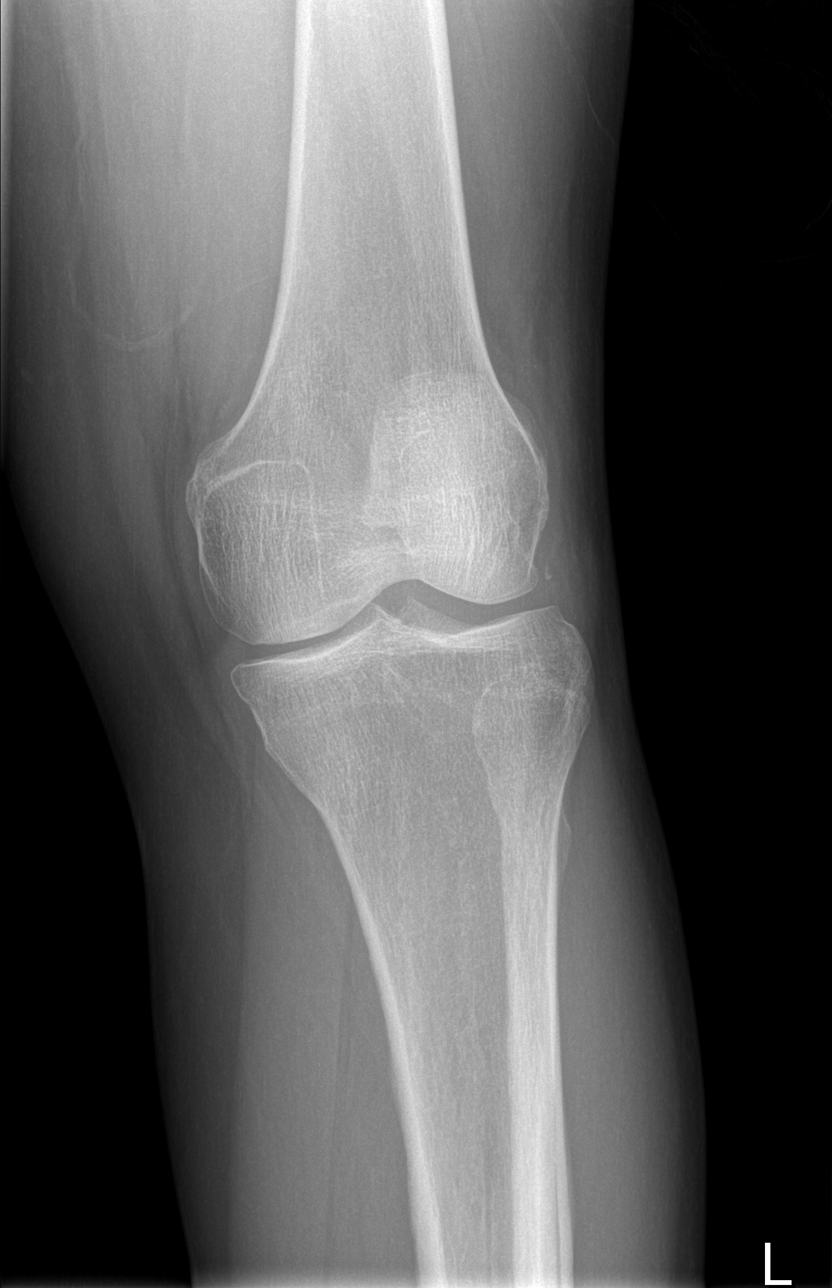

[t knee lat left]
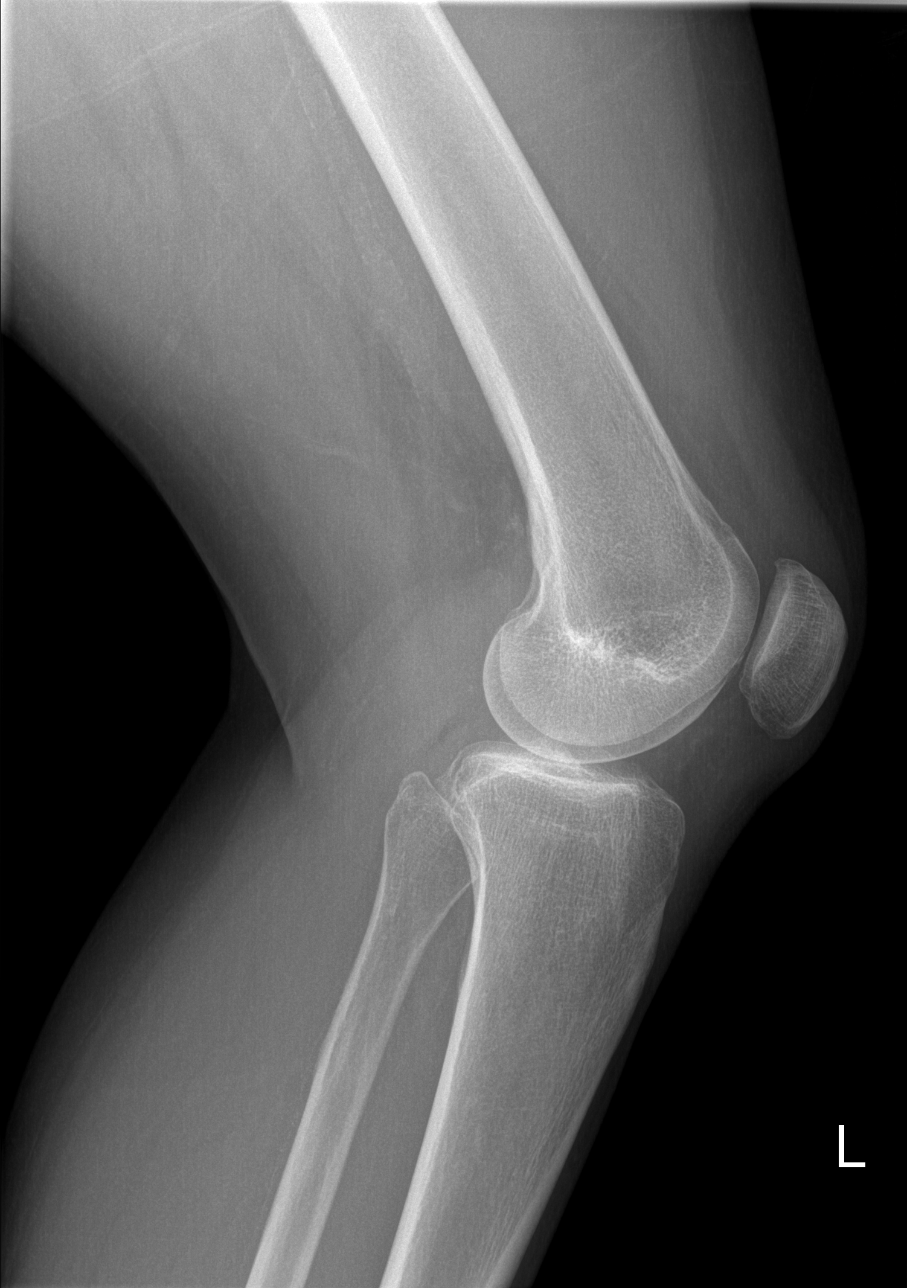

[4 of 4 positions shown; findings below may reference images not displayed]

FINDINGS: There is no acute fracture or dislocation. There is mild narrowing
of the medial compartment suggestive of mild arthritic changes.
There is no joint effusion. The soft tissues appear unremarkable.
IMPRESSION: No acute fracture or dislocation.

## 2017-06-01 IMAGING — CR DG FOOT COMPLETE 3+V*L*
3 series · 3 of 3 positions shown · non-contrast
Comparison: 04/06/2015

CLINICAL DATA: Fall 1 day ago.  Injured left foot.  Abrasions PE

EXAM:
LEFT FOOT - COMPLETE 3+ VIEW

[t foot ap left]
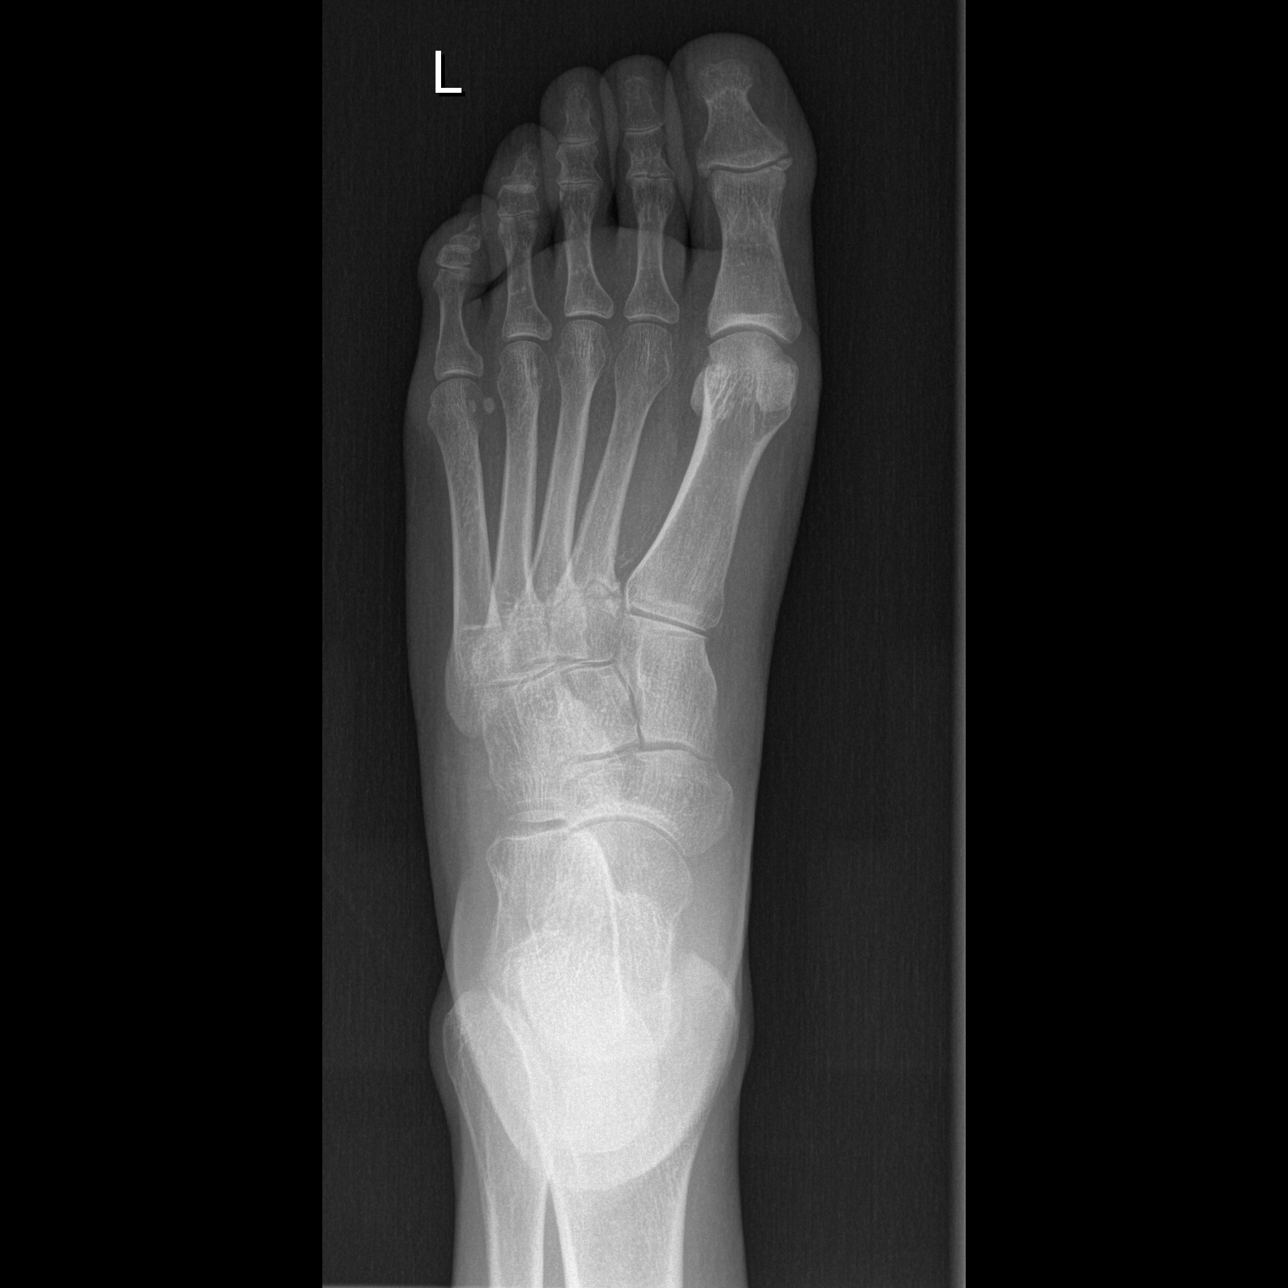

[t foot oblique left]
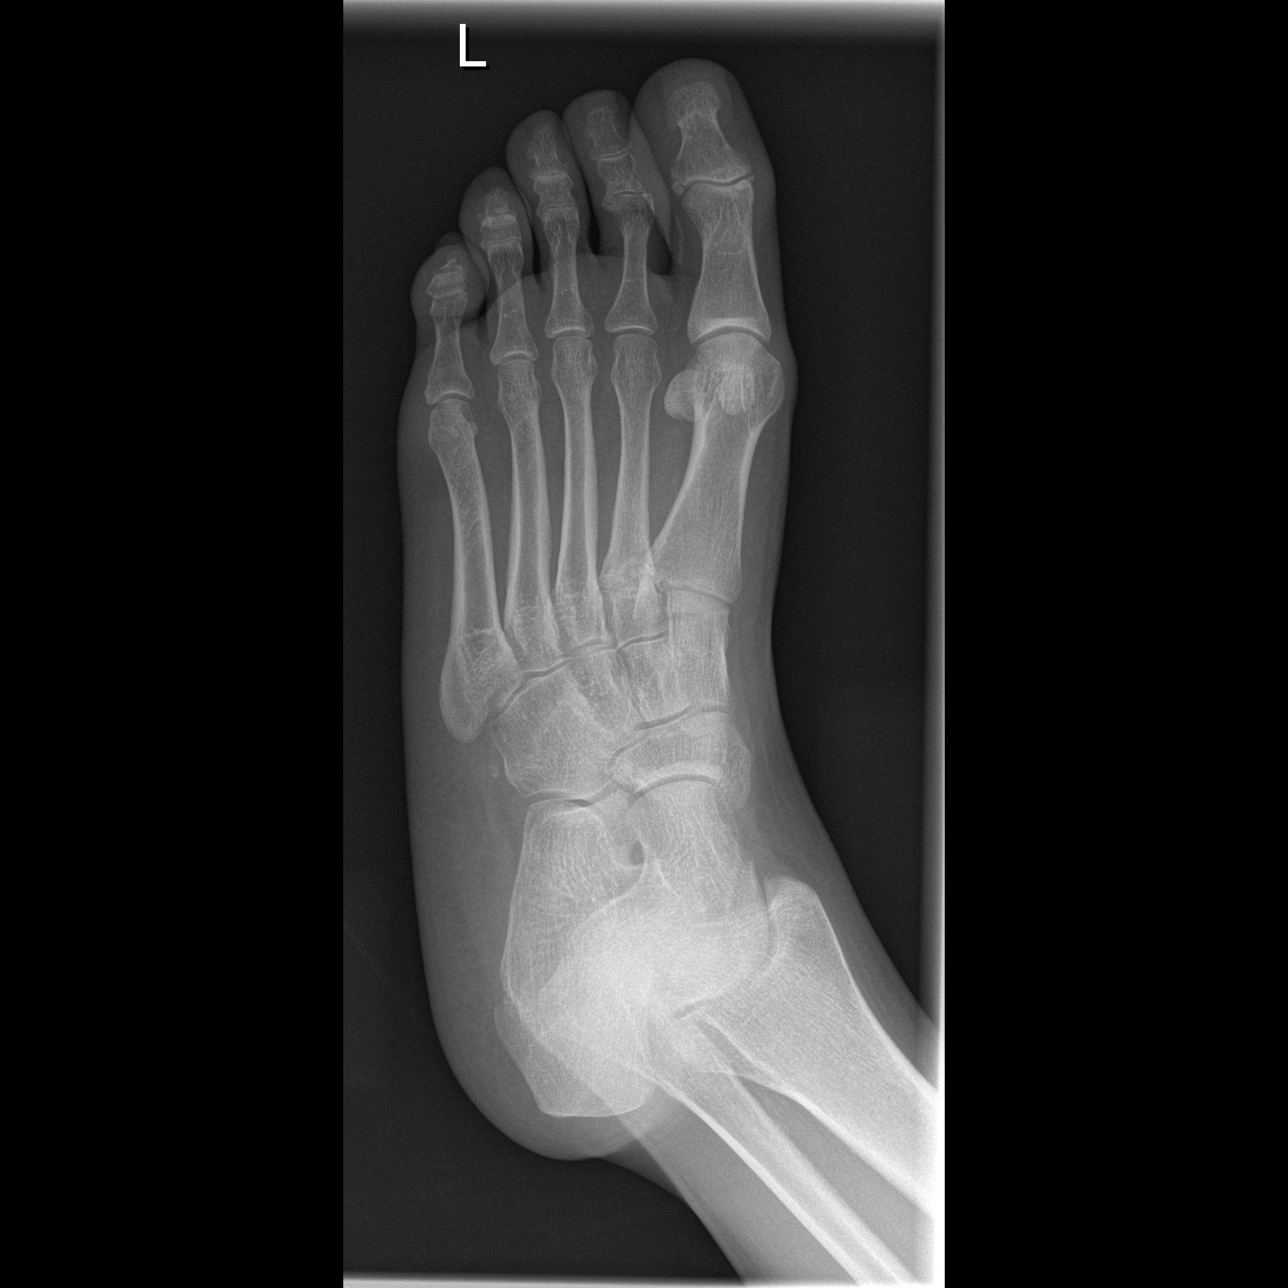

[t foot lat left]
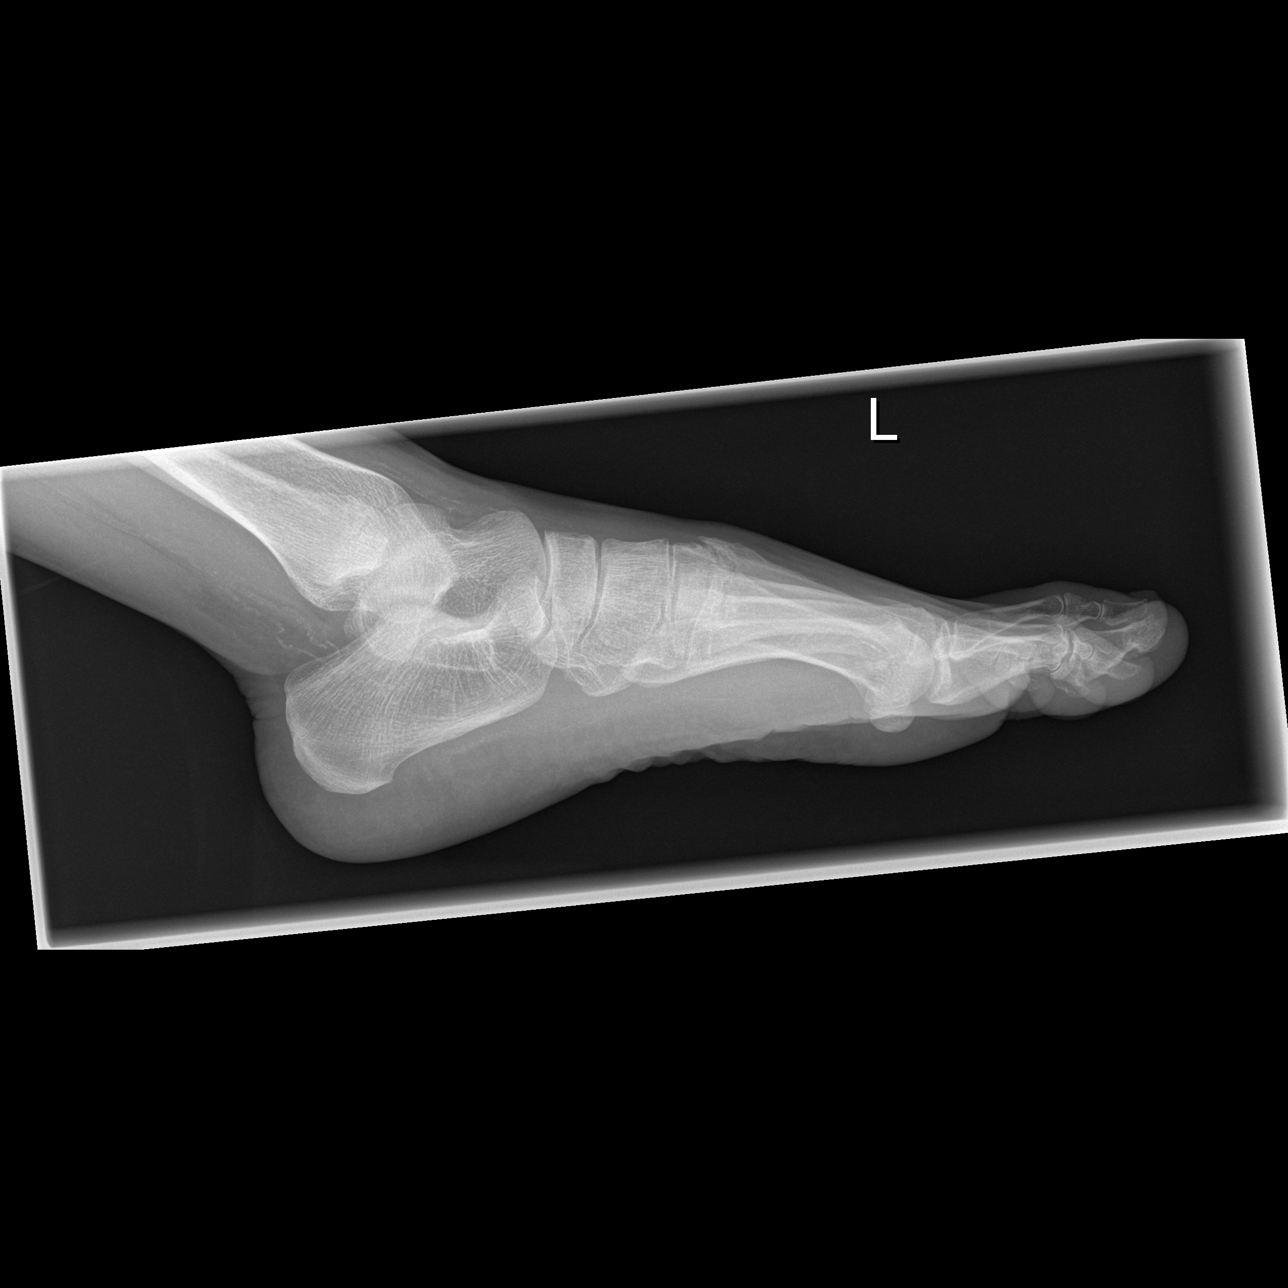

[3 of 3 positions shown; findings below may reference images not displayed]

FINDINGS: Bold fracture again noted at the base of the second metatarsal with
nonunion, unchanged since prior study. No acute fracture,
subluxation or dislocation. Soft tissues are intact.
IMPRESSION: No acute bony abnormality.

## 2017-06-13 IMAGING — CR DG CHEST 2V
2 series · 2 of 2 positions shown · non-contrast
Comparison: CT chest and PA and lateral chest 06/26/2015.

CLINICAL DATA: Cough and chest and upper abdominal pain for 1 day.

EXAM:
CHEST  2 VIEW

[w chest pa]
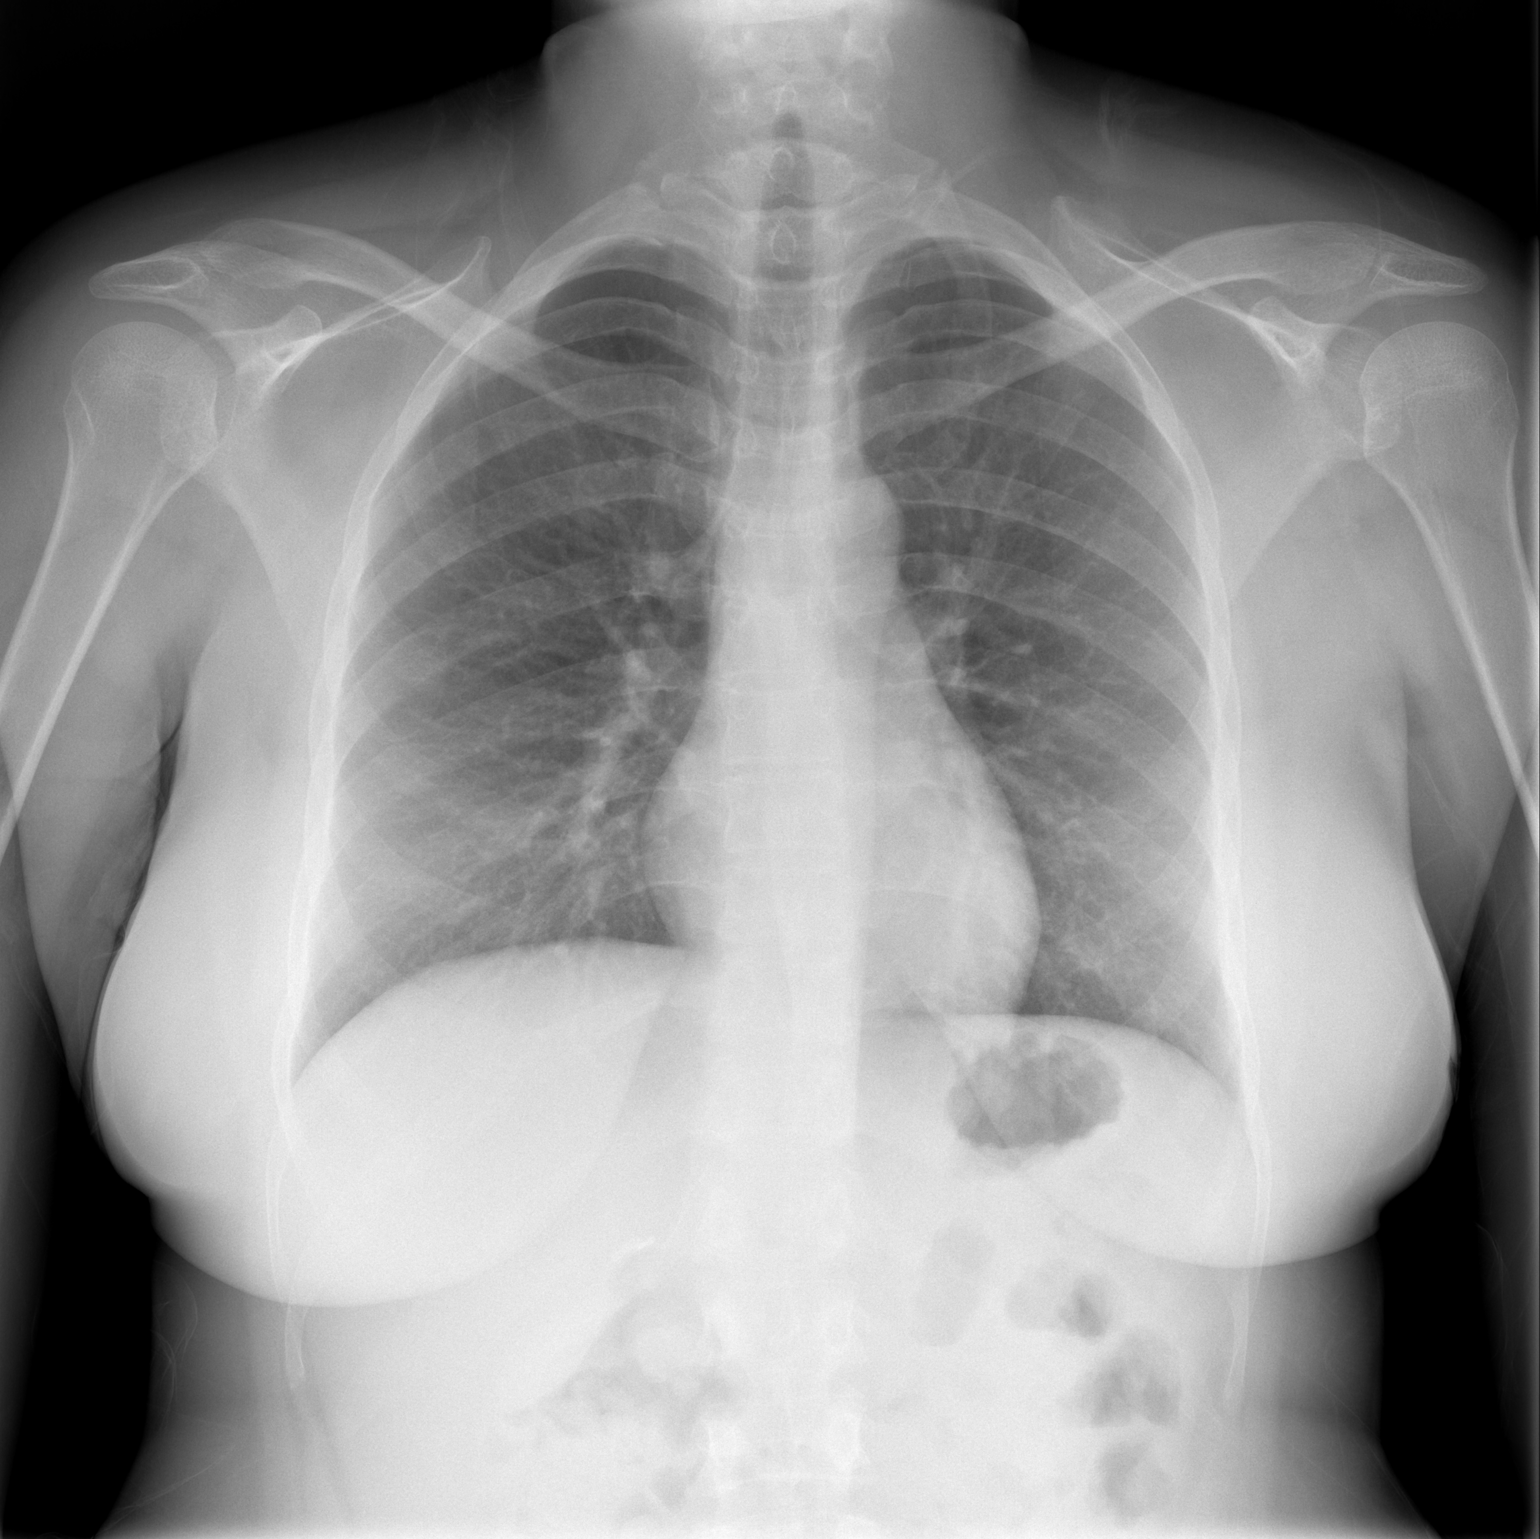

[w chest lat]
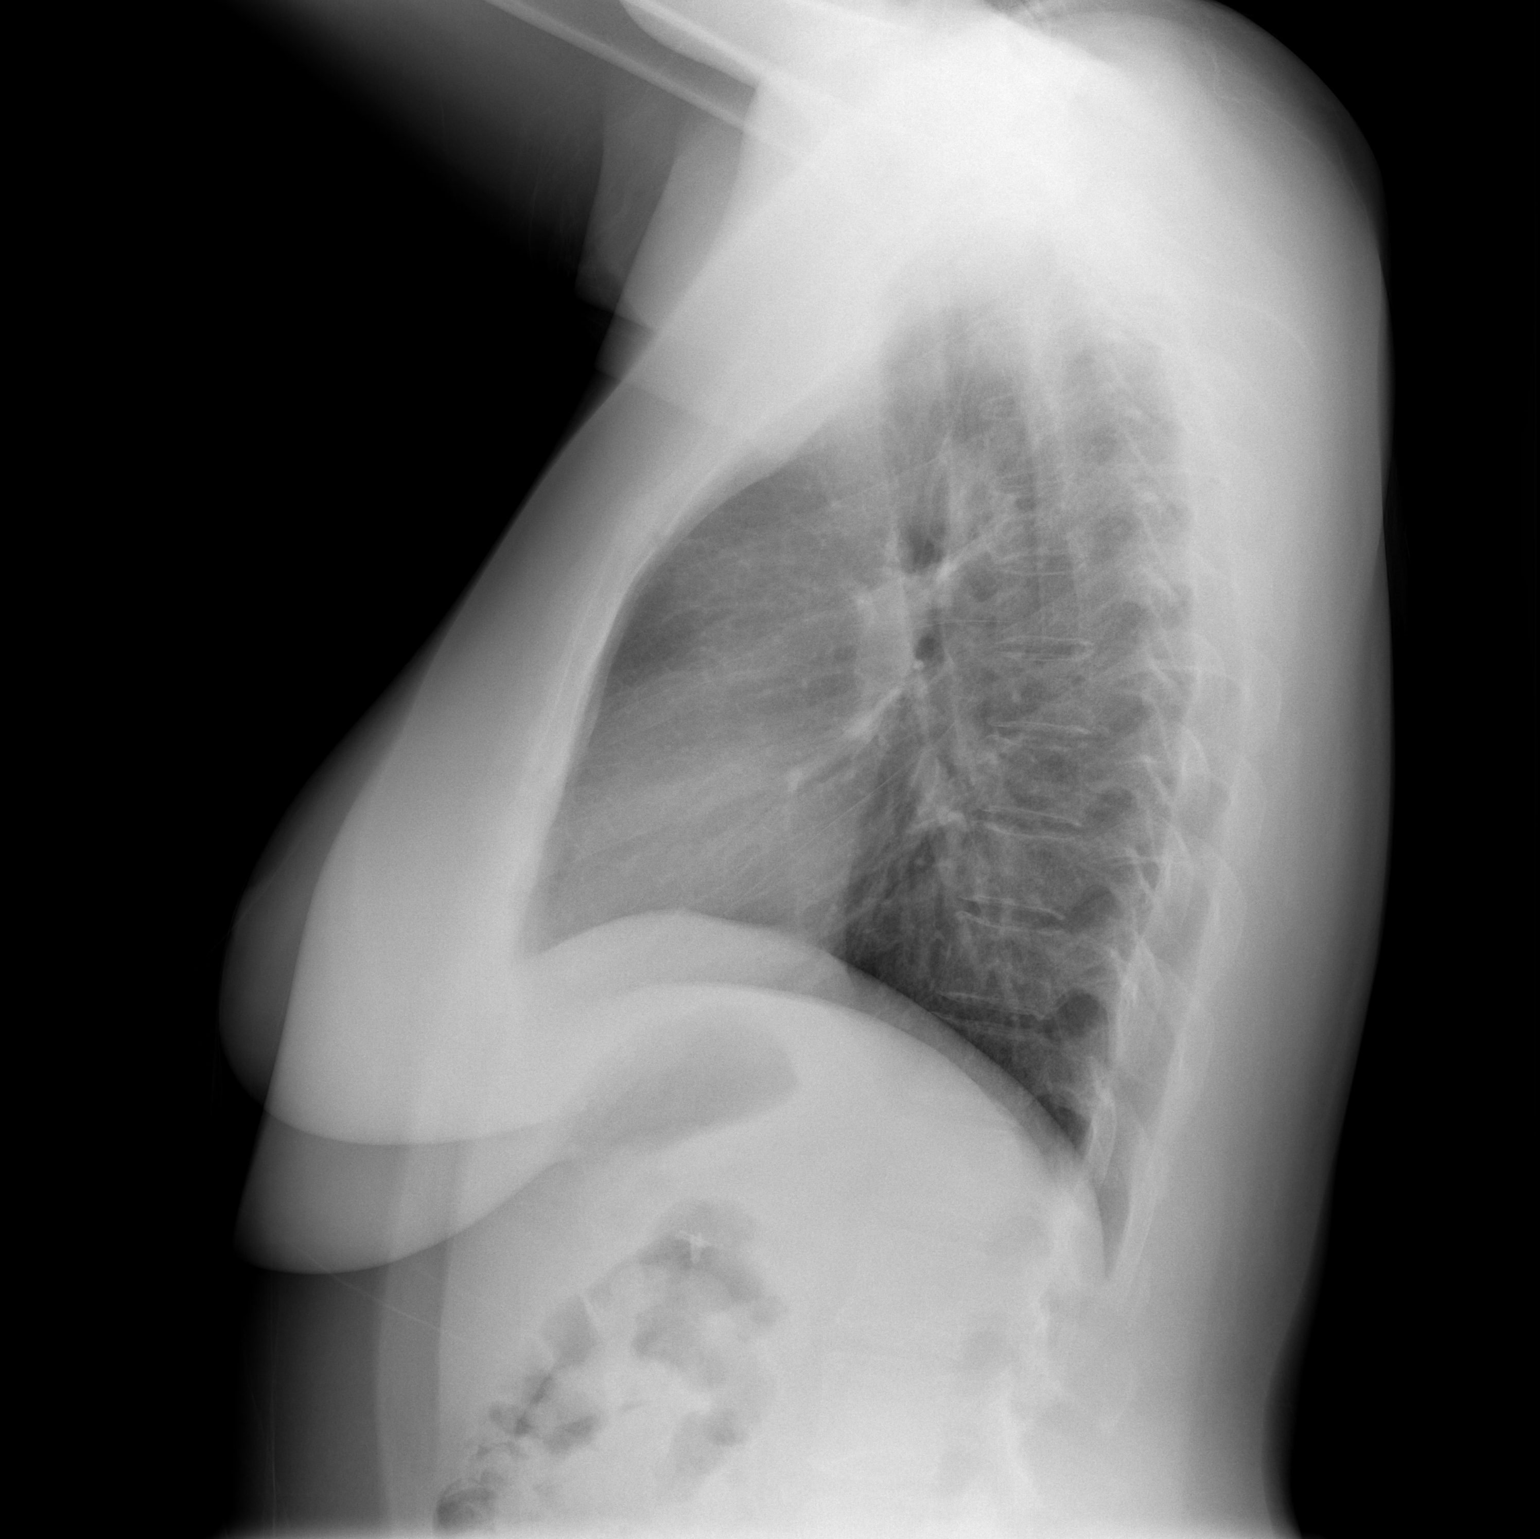

[2 of 2 positions shown; findings below may reference images not displayed]

FINDINGS: The lungs are clear. Heart size is normal. No pneumothorax or
pleural effusion. No bony abnormality.
IMPRESSION: Negative chest.

## 2017-06-18 IMAGING — DX DG KNEE COMPLETE 4+V*R*
4 series · 4 of 4 positions shown · non-contrast
Comparison: None.

CLINICAL DATA: 44-year-old female with right knee pain following
fall 1 week ago. Initial encounter.

EXAM:
RIGHT KNEE - COMPLETE 4+ VIEW

[t knee ap right]
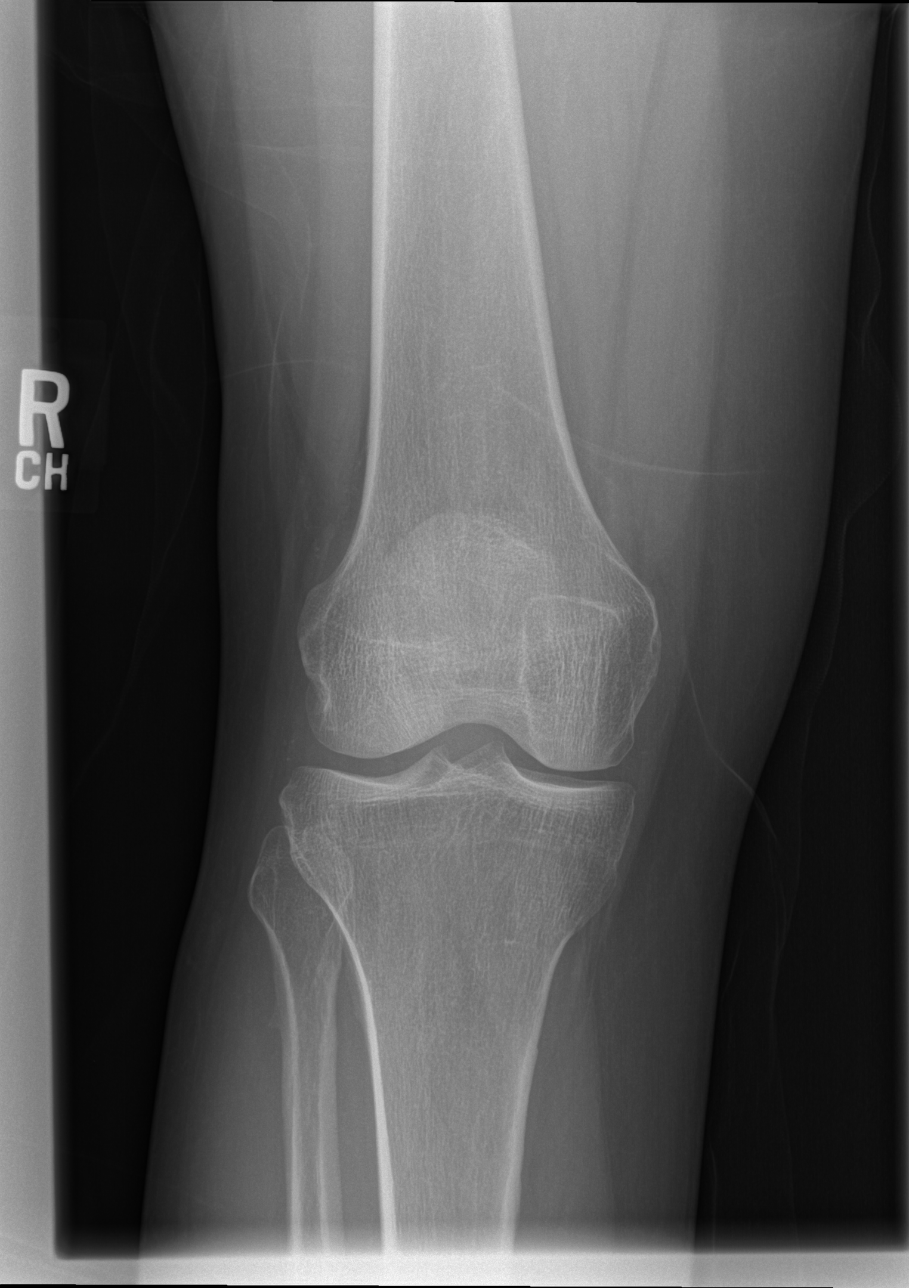

[t knee obl right]
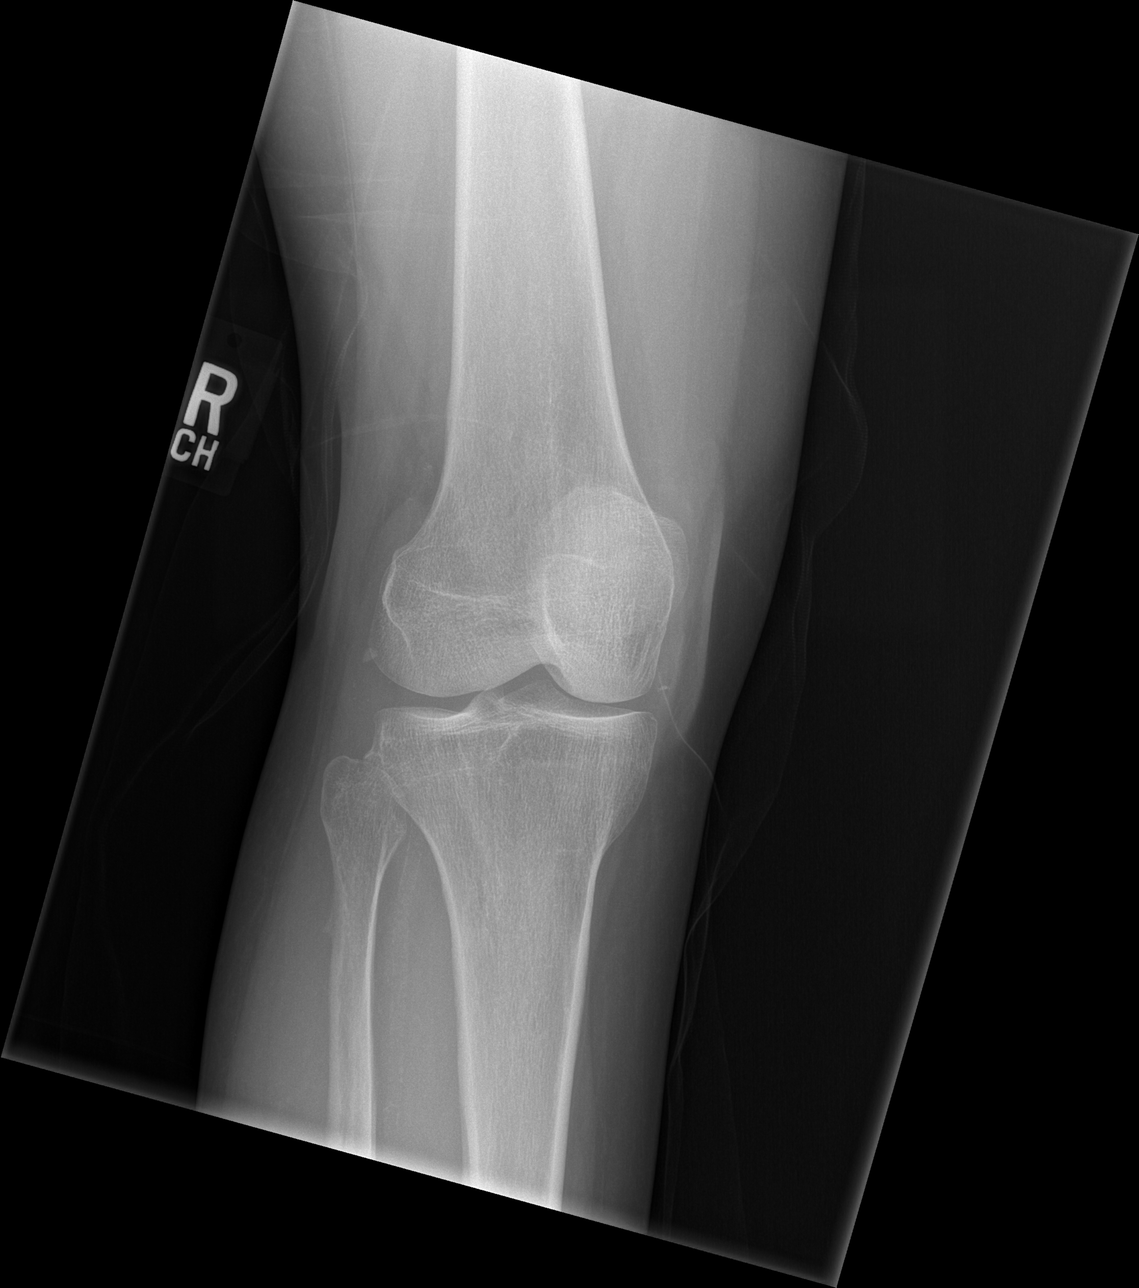

[t knee lat right (1 of 2)]
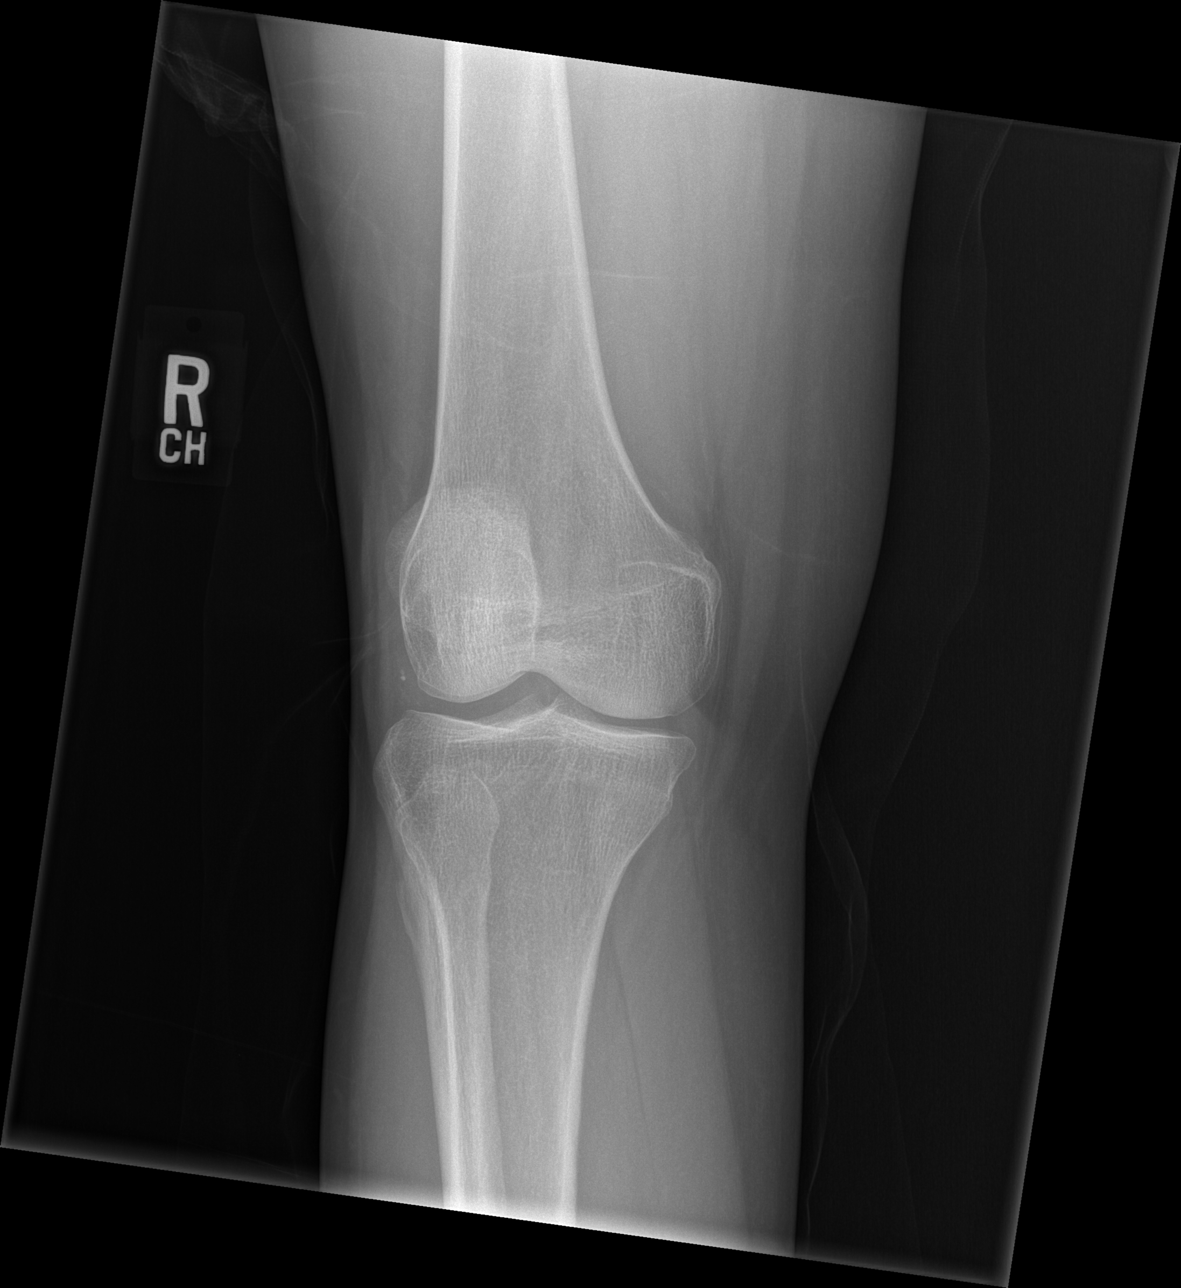

[t knee lat right (2 of 2)]
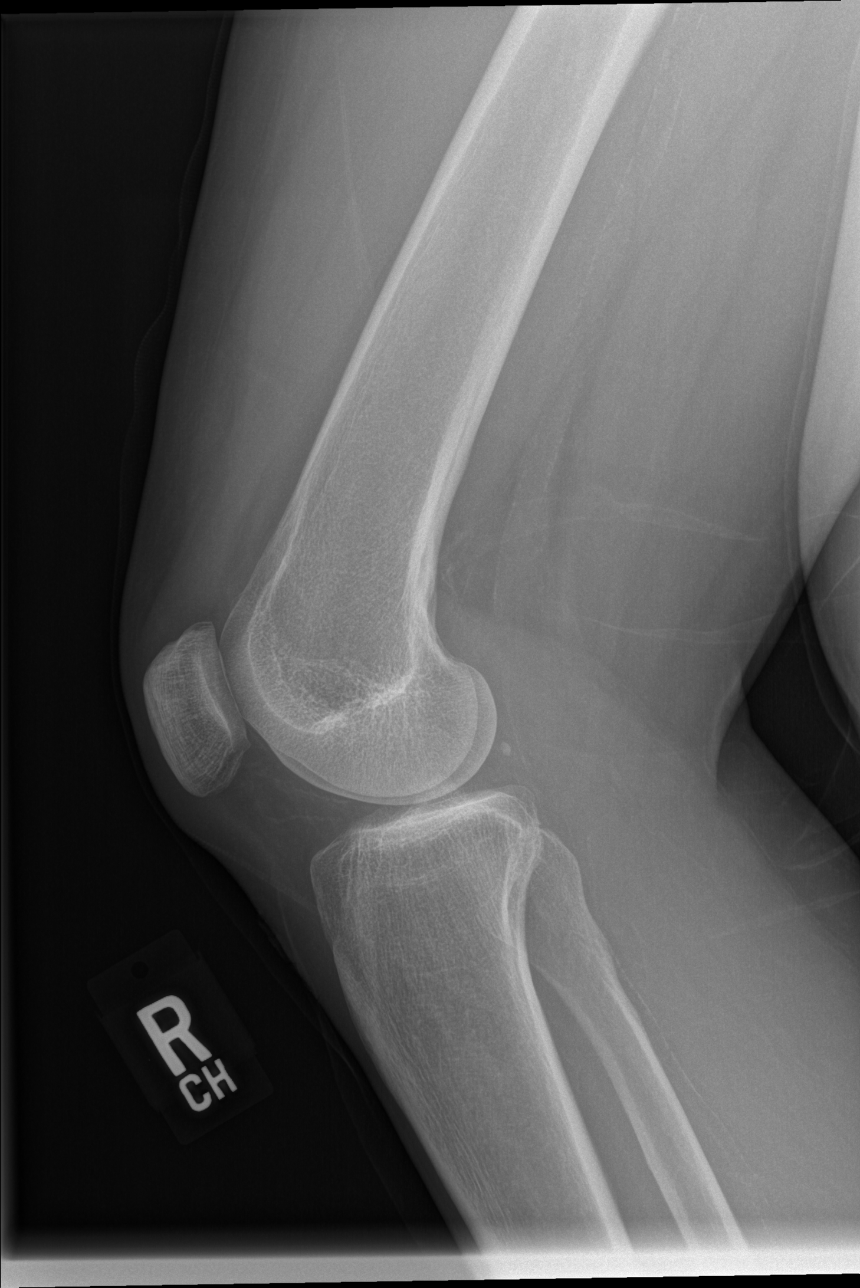

[4 of 4 positions shown; findings below may reference images not displayed]

FINDINGS: No evidence of fracture, dislocation, or joint effusion. No evidence
of arthropathy or other focal bone abnormality. Soft tissues are
unremarkable.
IMPRESSION: Negative.

## 2017-09-21 IMAGING — CT CT ANGIO CHEST
2 of 6 series · 18 of 36 positions shown · IV contrast (Omni 300)
Comparison: None.

CLINICAL DATA: Chest pain for several days, initial encounter

EXAM:
CT ANGIOGRAPHY CHEST WITH CONTRAST
TECHNIQUE: Multidetector CT imaging of the chest was performed using the
standard protocol during bolus administration of intravenous
contrast. Multiplanar CT image reconstructions and MIPs were
obtained to evaluate the vascular anatomy.
CONTRAST:  70 mL of Isovue 370.

[Series 6: pe thins · axial · 0.58mm/px · z∈[+1071,+1305]mm · 17 of 264 slices shown]
[im 15/264  lung]
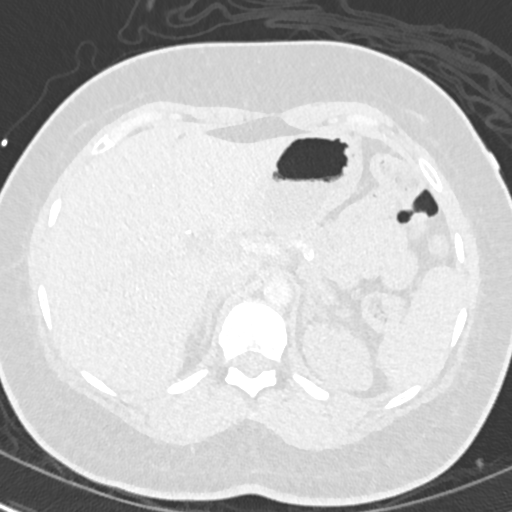
[im 30/264  mediastinal]
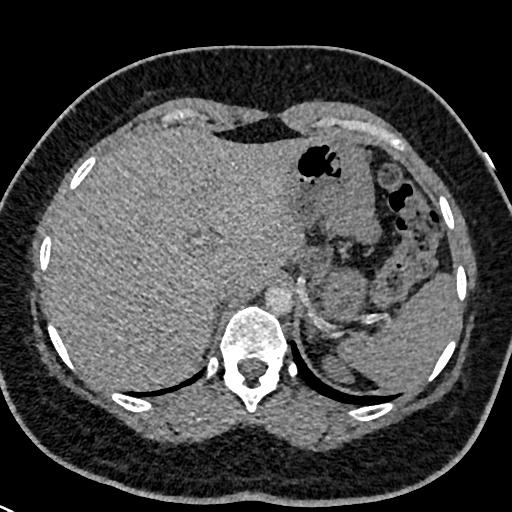
[im 44/264  lung]
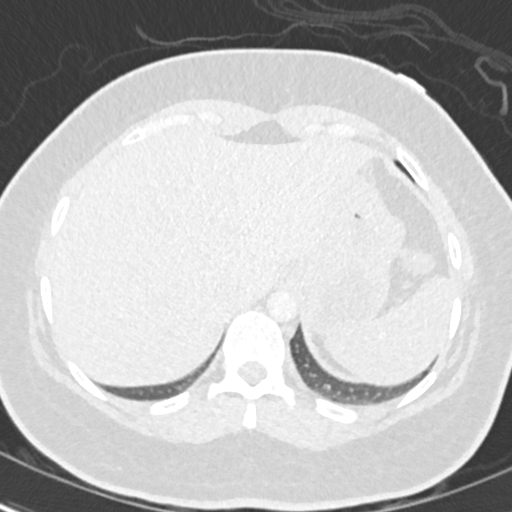
[im 59/264  mediastinal]
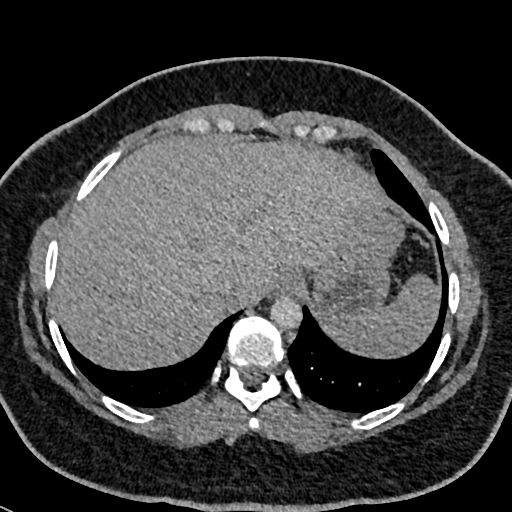
[im 74/264  lung]
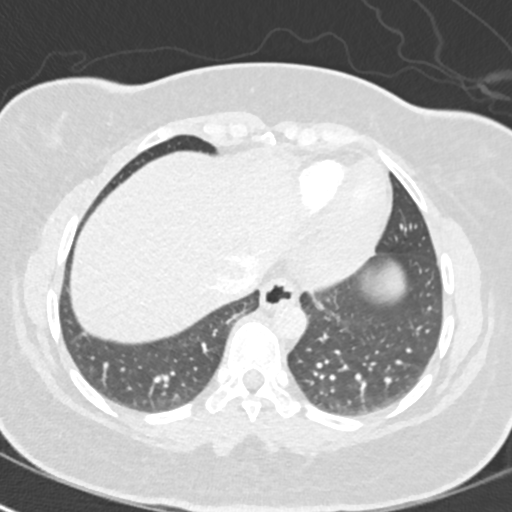
[im 88/264  mediastinal]
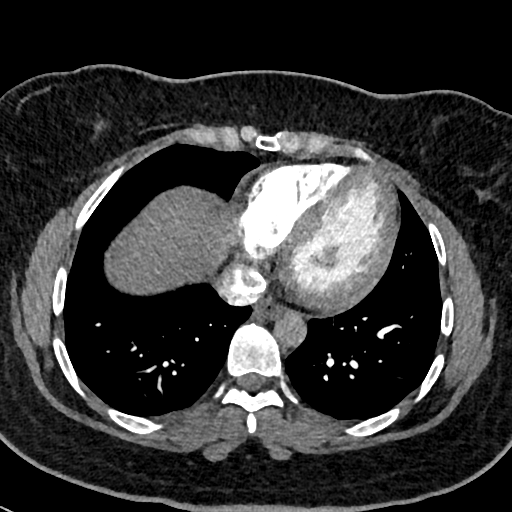
[im 103/264  lung]
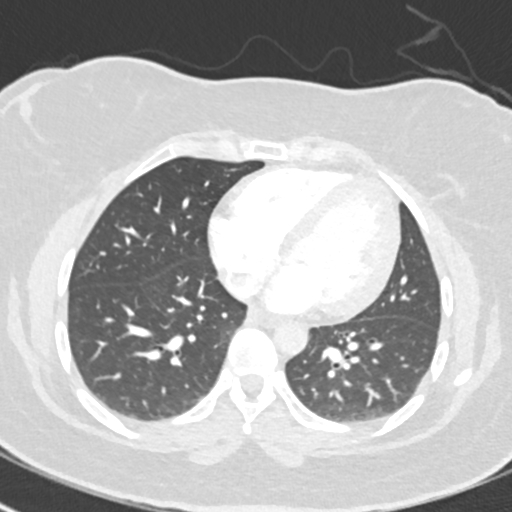
[im 117/264  mediastinal]
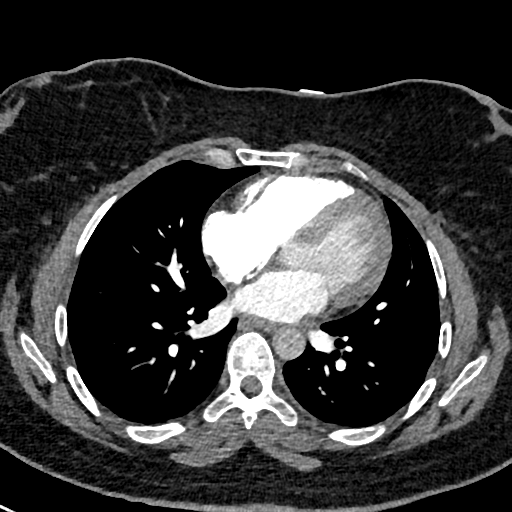
[im 132/264  lung]
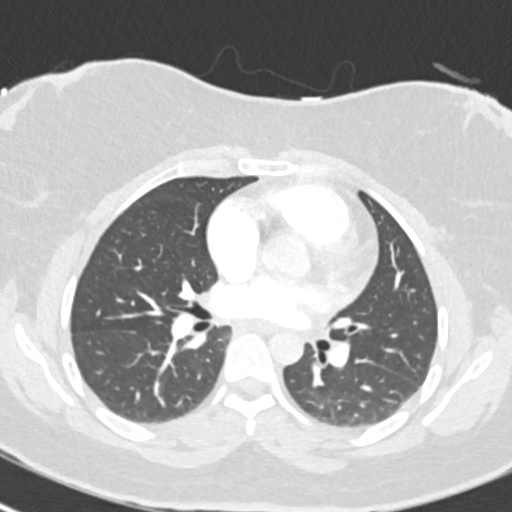
[im 147/264  mediastinal]
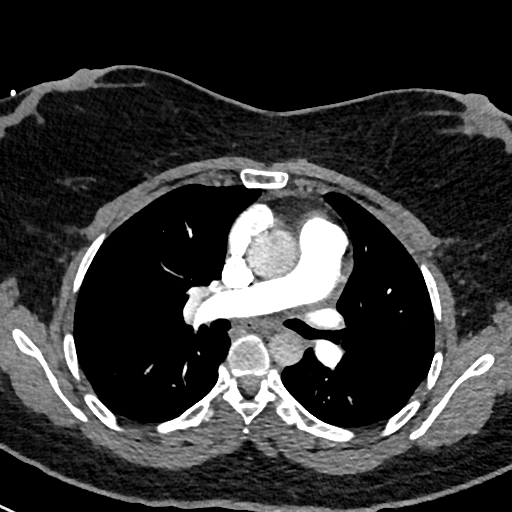
[im 161/264  lung]
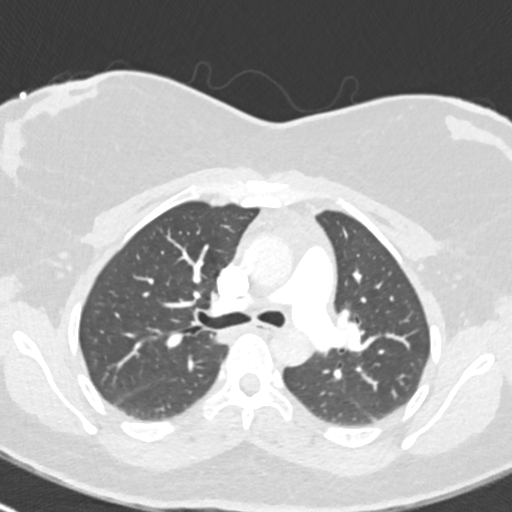
[im 176/264  mediastinal]
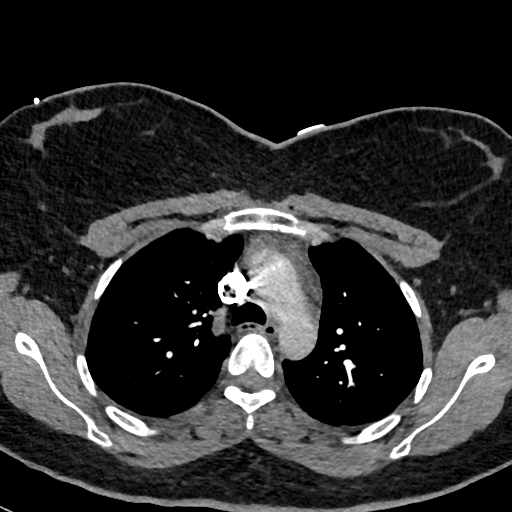
[im 190/264  lung]
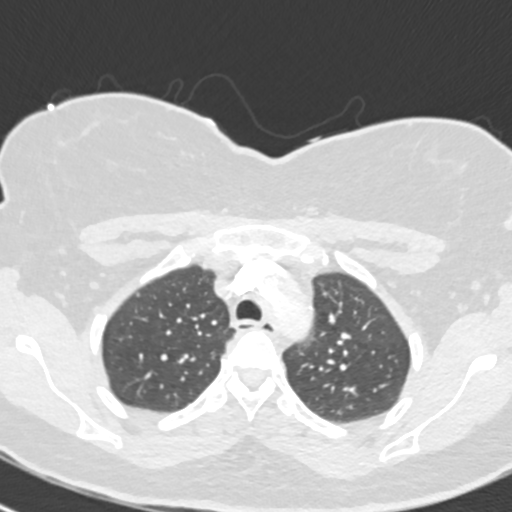
[im 205/264  mediastinal]
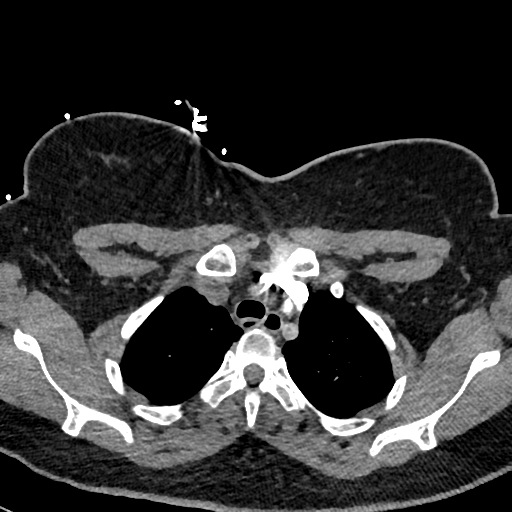
[im 220/264  lung]
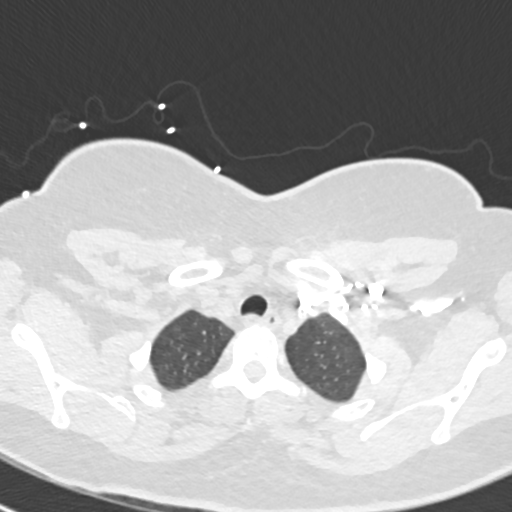
[im 234/264  mediastinal]
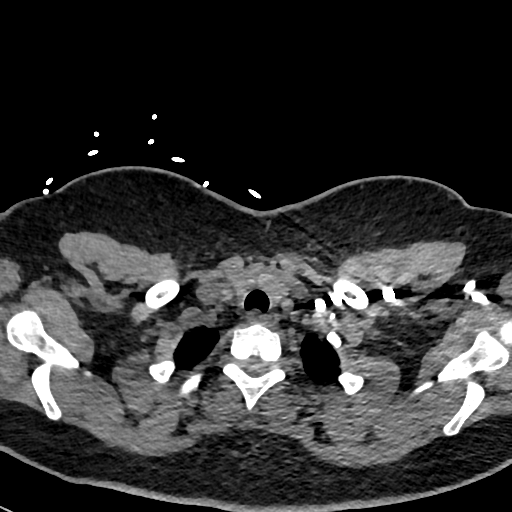
[im 249/264  lung]
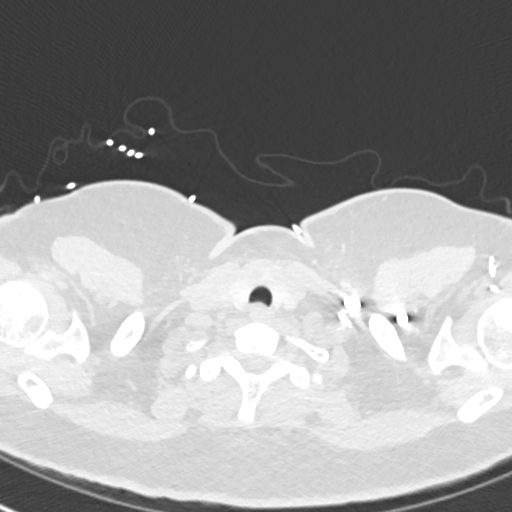

[Series 7: pe 2mm cor · coronal · 0.54mm/px · 1 of 113 slices shown]
[im 57/113  mediastinal]
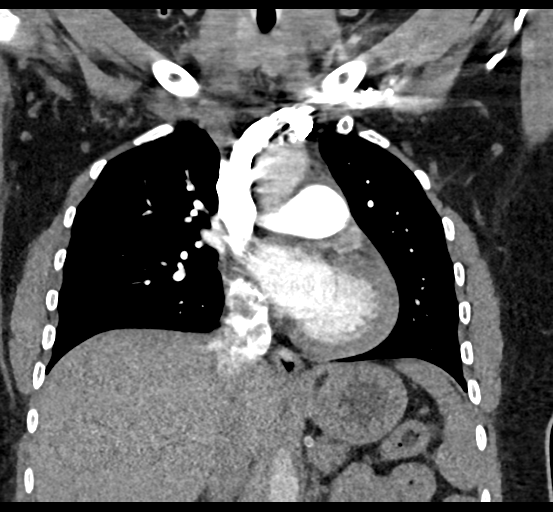

[18 of 36 positions shown; findings below may reference images not displayed]

FINDINGS: Cardiovascular: The thoracic aorta shows no significant
calcifications although evaluation is limited due to the lack of
significant contrast enhancement. The pulmonary artery however shows
adequate enhancement without definitive filling defect to suggest
pulmonary embolism.

Mediastinum/Nodes: No sizable hilar or mediastinal adenopathy is
noted. No axillary adenopathy is seen. The thoracic inlet is within
normal limits. The esophagus as visualized is unremarkable.

Lungs/Pleura: The lungs are well aerated bilaterally. No focal
infiltrate or sizable effusion is seen. A a single 2 mm subpleural
nodule is noted in the right middle lobe best seen on image number
69 of series 5.

Upper Abdomen: No acute abnormality.

Musculoskeletal: No chest wall abnormality. No acute or significant
osseous findings.

Review of the MIP images confirms the above findings.
IMPRESSION: No evidence of pulmonary emboli.

2 mm subpleural nodule on the right. No follow-up needed if patient
is low-risk. Non-contrast chest CT can be considered in 12 months if
patient is high-risk. This recommendation follows the consensus
statement: Guidelines for Management of Incidental Pulmonary Nodules
Detected on CT Images: From the [HOSPITAL] 6010; Radiology

## 2017-09-22 IMAGING — CT CT CERVICAL SPINE W/O CM
4 series · 16 of 33 positions shown, 19 images · non-contrast
Comparison: MRI of the cervical spine performed 10/17/2009

CLINICAL DATA: Acute onset of headache, radiating to the neck.
Initial encounter.

EXAM:
CT CERVICAL SPINE WITHOUT CONTRAST
TECHNIQUE: Multidetector CT imaging of the cervical spine was performed without
intravenous contrast. Multiplanar CT image reconstructions were also
generated.

[Series 3: c_spine 2.0 st · axial · 0.34mm/px · z∈[-334,-228]mm · 5 of 81 slices shown, 7 images]
[im 14/81  soft-tissue]
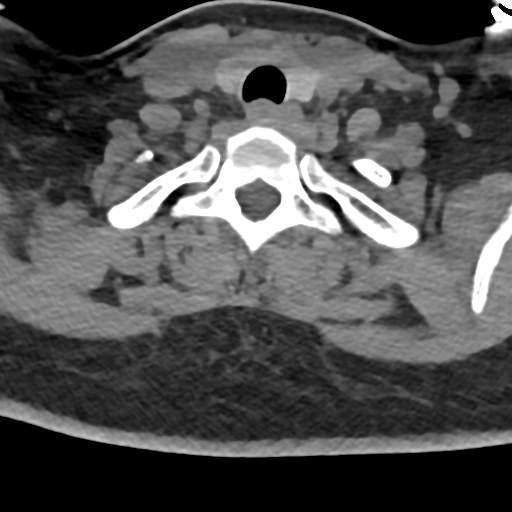
[im 14/81  bone]
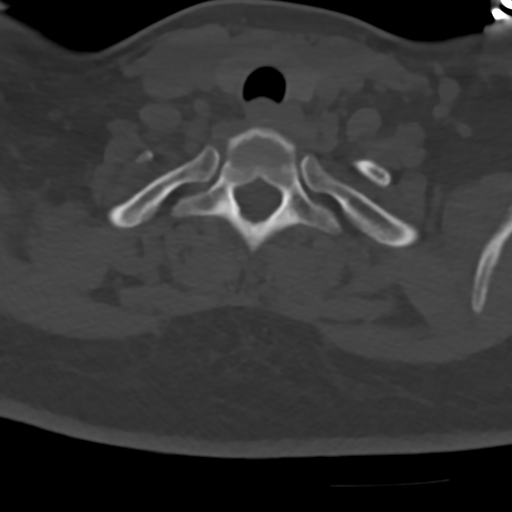
[im 27/81  bone]
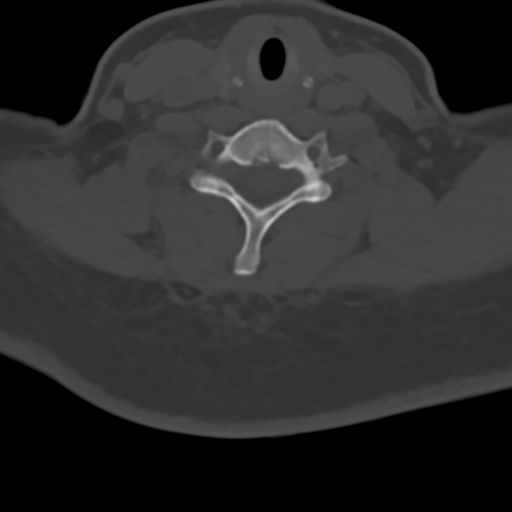
[im 41/81  bone]
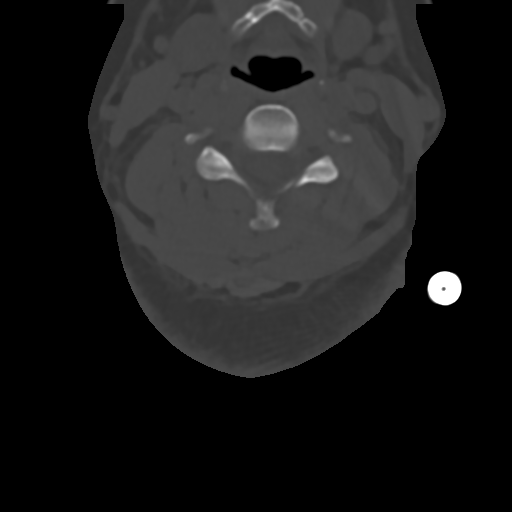
[im 54/81  bone]
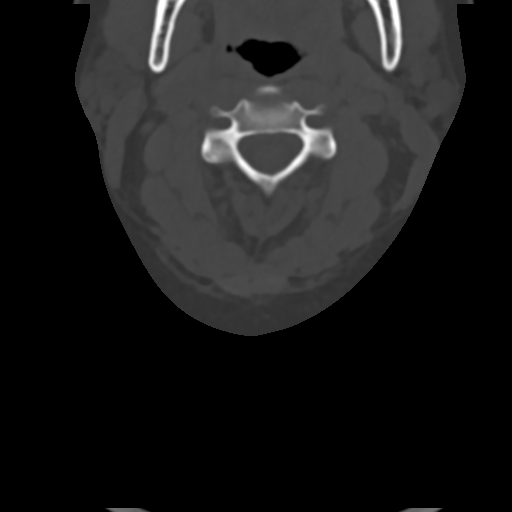
[im 67/81  soft-tissue]
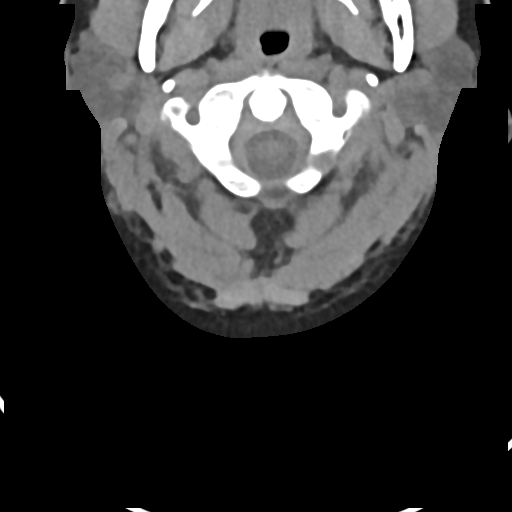
[im 67/81  bone]
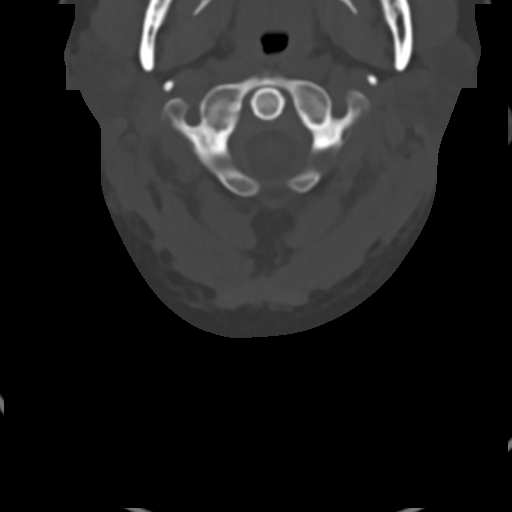

[Series 5: c_spine 2.0 sag bone · sagittal · 0.31mm/px · 5 of 44 slices shown, 6 images]
[im 15/44  bone]
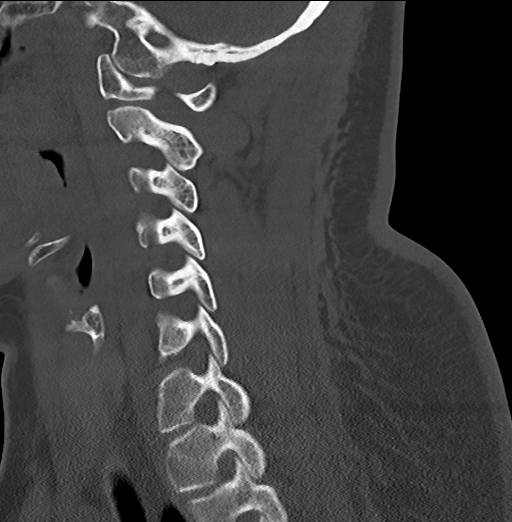
[im 18/44  bone]
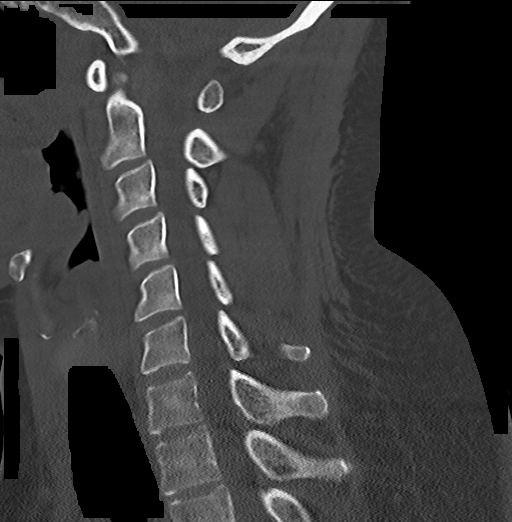
[im 22/44  soft-tissue]
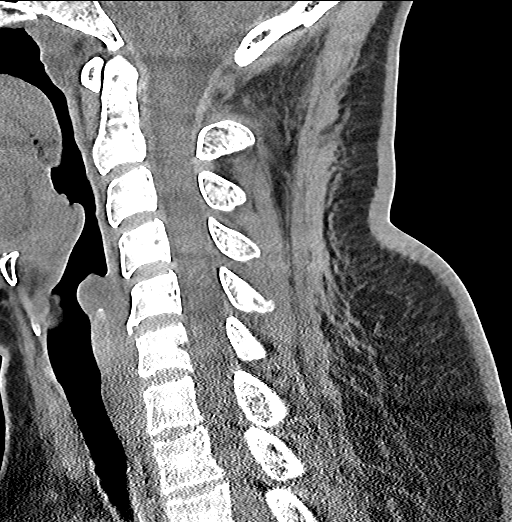
[im 22/44  bone]
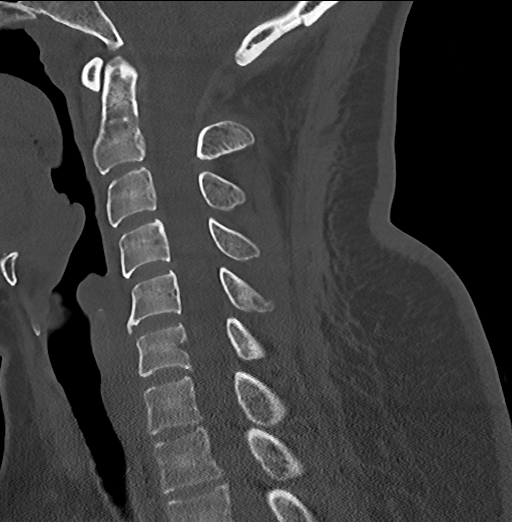
[im 26/44  bone]
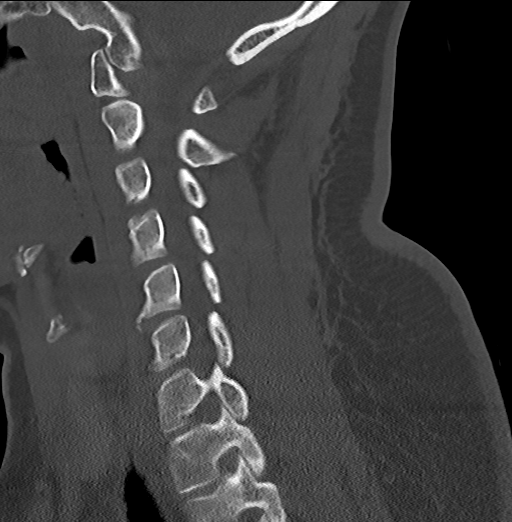
[im 29/44  bone]
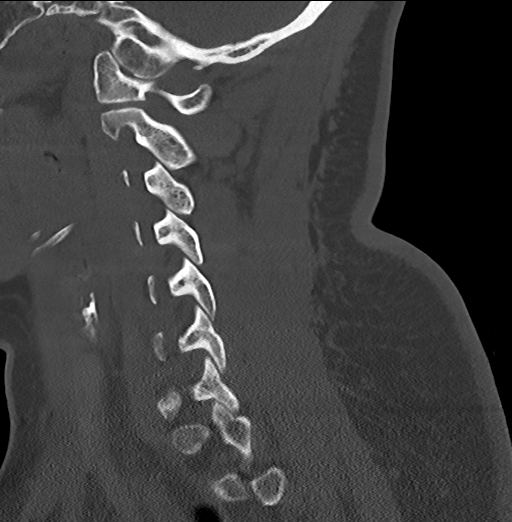

[Series 6: c_spine 2.0 cor bone · coronal · 0.30mm/px · 3 of 51 slices shown]
[im 11/51  bone]
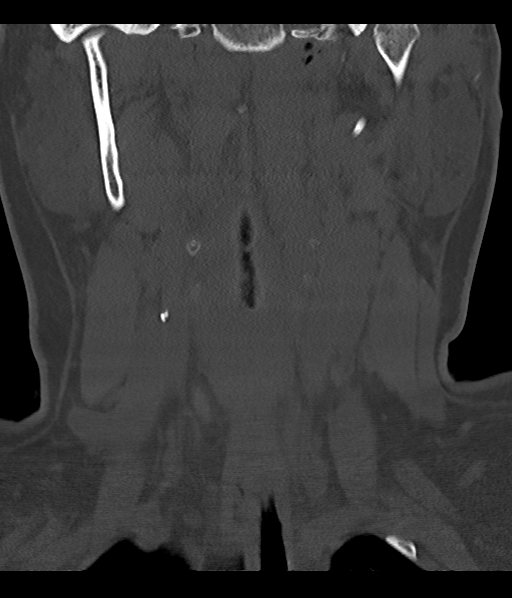
[im 21/51  bone]
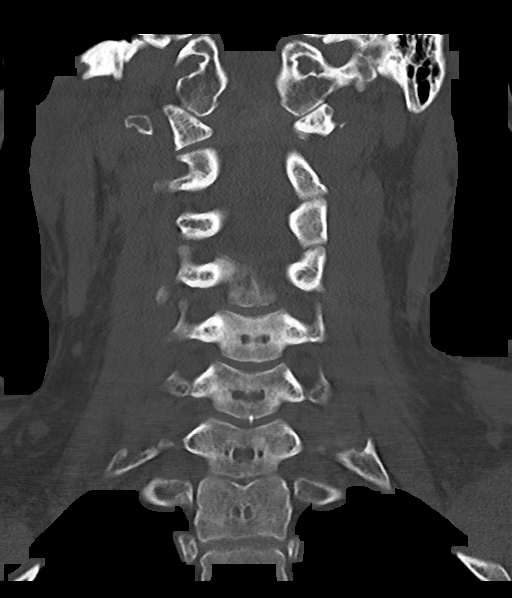
[im 31/51  bone]
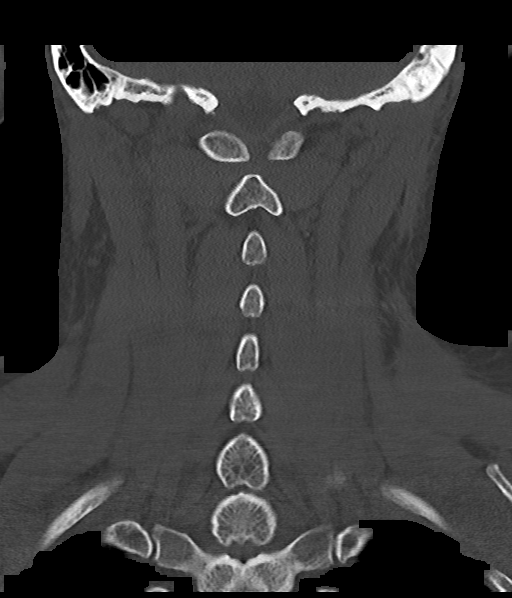

[Series 7: c_spine 2.0 orthogonals · axial · 0.21mm/px · z∈[-346,-292]mm · 3 of 86 slices shown]
[im 15/86  bone]
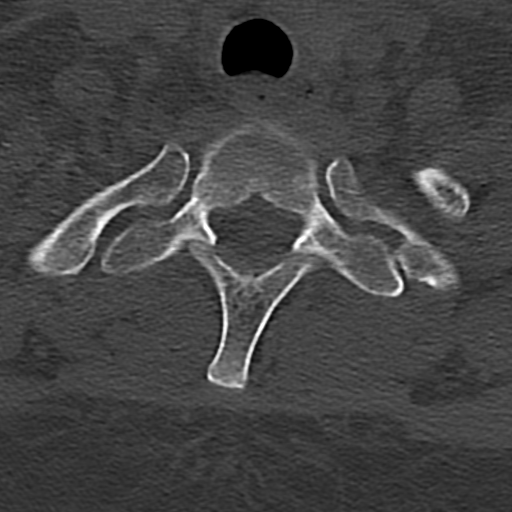
[im 29/86  bone]
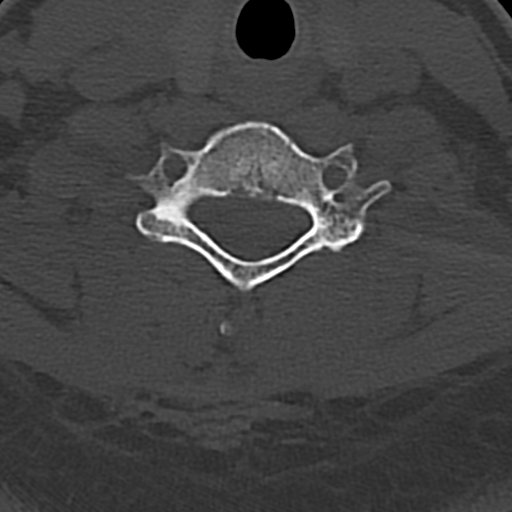
[im 43/86  bone]
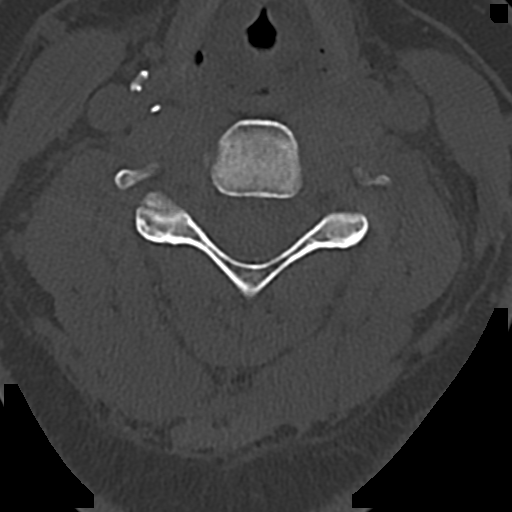

[16 of 33 positions shown; findings below may reference images not displayed]

FINDINGS: Alignment: Normal.

Skull base and vertebrae: No acute fracture. No primary bone lesion
or focal pathologic process. There is incomplete fusion of the
posterior arch of C1.

Soft tissues and spinal canal: No prevertebral fluid or swelling. No
visible canal hematoma.

Disc levels: Intervertebral disc spaces are preserved. The bony
foramina are grossly unremarkable.

Upper chest: Mild calcification is noted at the carotid bifurcations
bilaterally. The visualized lung apices are clear.

Other: The visualized portions of the brain are unremarkable.
IMPRESSION: 1. No evidence of fracture or subluxation along the cervical spine.
2. Mild calcification at the carotid bifurcations bilaterally.
Carotid ultrasound could be considered for further evaluation, when
and as deemed clinically appropriate.

## 2017-10-08 ENCOUNTER — Other Ambulatory Visit: Payer: Self-pay

## 2017-10-08 ENCOUNTER — Emergency Department (HOSPITAL_BASED_OUTPATIENT_CLINIC_OR_DEPARTMENT_OTHER)
Admission: EM | Admit: 2017-10-08 | Discharge: 2017-10-08 | Disposition: A | Discharge status: Home / Self Care | Payer: Self-pay | Attending: Emergency Medicine | Admitting: Emergency Medicine

## 2017-10-08 ENCOUNTER — Encounter (HOSPITAL_BASED_OUTPATIENT_CLINIC_OR_DEPARTMENT_OTHER): Payer: Self-pay | Admitting: Emergency Medicine

## 2017-10-08 ENCOUNTER — Emergency Department (HOSPITAL_BASED_OUTPATIENT_CLINIC_OR_DEPARTMENT_OTHER): Payer: Self-pay

## 2017-10-08 DIAGNOSIS — Y939 Activity, unspecified: Secondary | ICD-10-CM | POA: Insufficient documentation

## 2017-10-08 DIAGNOSIS — Z86711 Personal history of pulmonary embolism: Secondary | ICD-10-CM | POA: Insufficient documentation

## 2017-10-08 DIAGNOSIS — E1042 Type 1 diabetes mellitus with diabetic polyneuropathy: Secondary | ICD-10-CM | POA: Insufficient documentation

## 2017-10-08 DIAGNOSIS — W2209XA Striking against other stationary object, initial encounter: Secondary | ICD-10-CM | POA: Insufficient documentation

## 2017-10-08 DIAGNOSIS — Y92003 Bedroom of unspecified non-institutional (private) residence as the place of occurrence of the external cause: Secondary | ICD-10-CM | POA: Insufficient documentation

## 2017-10-08 DIAGNOSIS — Y999 Unspecified external cause status: Secondary | ICD-10-CM | POA: Insufficient documentation

## 2017-10-08 DIAGNOSIS — Z7901 Long term (current) use of anticoagulants: Secondary | ICD-10-CM | POA: Insufficient documentation

## 2017-10-08 DIAGNOSIS — S92515A Nondisplaced fracture of proximal phalanx of left lesser toe(s), initial encounter for closed fracture: Secondary | ICD-10-CM | POA: Insufficient documentation

## 2017-10-08 DIAGNOSIS — Z86718 Personal history of other venous thrombosis and embolism: Secondary | ICD-10-CM | POA: Insufficient documentation

## 2017-10-08 DIAGNOSIS — Z8673 Personal history of transient ischemic attack (TIA), and cerebral infarction without residual deficits: Secondary | ICD-10-CM | POA: Insufficient documentation

## 2017-10-08 HISTORY — DX: Acute embolism and thrombosis of unspecified deep veins of unspecified lower extremity: I82.409

## 2017-10-08 NOTE — Discharge Instructions (Addendum)
Use post op shoe and crutches. Bear weight as tolerated

## 2017-10-08 NOTE — ED Provider Notes (Signed)
[facility] EMERGENCY DEPARTMENT Provider Note   CSN: [id number] Arrival date & time: 10/08/17  1013     History   Chief Complaint Chief Complaint  Patient presents with  . Foot Pain    HPI Maureen [name] is a 47 y.o. female.  Patient kicked bed accidentally with left foot several days ago and continues with pain.   The history is provided by the patient.  Foot Pain  This is a new problem. The current episode started more than 2 days ago. The problem occurs daily. Progression since onset: waxing and waning. Pertinent negatives include no chest pain, no abdominal pain, no headaches and no shortness of breath. The symptoms are aggravated by walking. Nothing relieves the symptoms. She has tried nothing for the symptoms. The treatment provided no relief.    Past Medical History:  Diagnosis Date  . Abdominal pain   . Achilles tendon contracture, left    with lisfrabc fracture  . Anxiety   . Arthritis   . Biliary dyskinesia   . Cervical strain   . Closed head injury 2010  . Depression   . DVT (deep venous thrombosis) ([place])   . Elevated LFTs   . Fibromyalgia   . GERD (gastroesophageal reflux disease)   . History of kidney stones   . Hyperlipidemia   . Hypoglycemia   . Joint pain   . Major depressive disorder   . Migraine    hx of none recent  . Nausea & vomiting   . OSA on CPAP 05/14/2016   Moderate to severe OSA with an AHI of 28/hr now on CPAP at 19cm H2O  . Pneumonia   . Post traumatic stress disorder (PTSD)   . Pulmonary embolism ([place])   . Renal stones   . Skin rash    due to medications  . Sleep trouble   . Stroke [facility])    " series of mini strokes around 2007"  . TIA (transient ischemic attack)   . Wears glasses   . 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 OSA on CPAP 05/14/2016  . Obesity (BMI 30-39.9) 05/14/2016  . Acute deep vein  thrombosis (DVT) of femoral vein of left lower extremity ([place]) 02/10/2016  . Diabetic polyneuropathy associated with type 2 diabetes mellitus ([place]) 02/10/2016  . Lisfranc dislocation, left, sequela 12/16/2015  . Short Achilles tendon (acquired), left ankle 12/16/2015  . Chest pain with moderate risk for cardiac etiology 11/29/2015  . Elevated troponin 11/19/2015  . Migraine headache 11/19/2015  . Neck pain 11/19/2015  . Acute kidney injury ([place]) 02/19/2015  . DKA (diabetic ketoacidoses) ([place]) 10/24/2014  . HLD (hyperlipidemia) 10/24/2014  . Abnormal LFTs 09/03/2014  . Dizziness 09/03/2014  . Acute pulmonary embolism ([place]) 09/03/2014  . Transaminitis 09/02/2014  . GAD (generalized anxiety disorder) 11/23/2013  . PTSD (post-traumatic stress disorder) 11/23/2013  . Severe major depression without psychotic features ([place]) 10/10/2013  . Endometriosis 12/08/2011  . Arthritis of left knee 12/08/2011  . Diabetic retinopathy ([place]) 12/08/2011  . Fibromyalgia 10/23/2011  . DM type 1 (diabetes mellitus, type 1) ([place]) 10/13/2011    Past Surgical History:  Procedure Laterality Date  . ABDOMINAL HYSTERECTOMY  2001  . APPENDECTOMY    . [place]  . CHOLECYSTECTOMY  02/10/2011   Procedure: LAPAROSCOPIC CHOLECYSTECTOMY WITH INTRAOPERATIVE CHOLANGIOGRAM;  Surgeon: Judieth Keens, DO;  Location: [facility] OR;  Service: General;  Laterality: N/A;  . COLONOSCOPY    . DILATION AND CURETTAGE OF UTERUS    . exploratory laparomty  Mar 04, 2012  . EYE SURGERY  2006   laser eye surgery left eye  . KNEE SURGERY Left 2012  . LAPAROSCOPIC APPENDECTOMY N/A 10/12/2012   Procedure: DIAGNOSTIC APPENDECTOMY LAPAROSCOPIC;  Surgeon: Madilyn Hook, DO;  Location: [facility] ORS;  Service: General;  Laterality: N/A;  . lipoma removed  yrs ago  . LIVER BIOPSY    . ORIF ANKLE FRACTURE Left 01/03/2016   Procedure: OPEN REDUCTION INTERNAL FIXATION (ORIF) LEFT LISFRANC JOINT, GASTROC RECESSION;  Surgeon: Newt Minion, MD;  Location: [facility];  Service: Orthopedics;  Laterality: Left;  . ROBOTIC ASSISTED LAPAROSCOPIC LYSIS OF ADHESION N/A 03/04/2012   Procedure: ROBOTIC ASSISTED LAPAROSCOPIC LYSIS OF EXTENSIVE ADHESIONS;  Surgeon: Claiborne Billings A. Pamala Hurry, MD;  Location: [facility];  Service: Gynecology;  Laterality: N/A;  . TONSILLECTOMY  2001     OB History   None      Home Medications    Prior to Admission medications   Medication Sig Start Date End Date Taking? Authorizing Provider  losartan (COZAAR) 25 MG tablet Take by mouth. 08/18/17  Yes [provider]  traMADol (ULTRAM) 50 MG tablet Take by mouth. 10/04/17  Yes [provider]  venlafaxine XR (EFFEXOR-XR) 37.5 MG 24 hr capsule Take by mouth. 10/02/17  Yes [provider]  warfarin (COUMADIN) 5 MG tablet TAKE 1 TABLET BY MOUTH DAILY AT 6PM. TAKE 1 & 1/2 TABLET ON [facility] 07/06/16  Yes [provider]  atorvastatin (LIPITOR) 40 MG tablet Take 40 mg by mouth at bedtime.     [provider]  insulin aspart (NOVOLOG) 100 UNIT/ML injection Inject 8 Units into the skin 3 (three) times daily with meals. 03/05/16   Golden Circle, FNP  insulin glargine (LANTUS) 100 UNIT/ML injection Inject 0.28 mLs (28 Units total) into the skin at bedtime. Patient taking differently: Inject 30 Units into the skin at bedtime.  03/05/16   Golden Circle, FNP  lidocaine (LIDODERM) 5 % Place 1 patch onto the skin daily as needed (pain). Remove after 12 hours. 10/30/15   [provider]    Family History Family History  Problem Relation Age of Onset  . Cancer Father        lymphoma  . Nephrolithiasis Father   . Vascular Disease Father   . Alcohol abuse Father   . Drug abuse Father   . Cancer Maternal Grandmother        colon  . Hypertension Mother   . Heart disease Mother   . Cataracts Mother   . Depression Mother   . Diabetes Brother   . Drug abuse Brother   . Mental illness Maternal Grandfather   . Cancer  Maternal Grandfather   . Heart disease Paternal Grandmother   . Heart disease Paternal Grandfather   . Suicidality Maternal Uncle     Social History Social History   Tobacco Use  . Smoking status: Never Smoker  . Smokeless tobacco: Never Used  Substance Use Topics  . Alcohol use: Yes    Comment: Socially  . Drug use: No     Allergies   Cephalexin; Ibuprofen; and Meloxicam   Review of Systems Review of Systems  Constitutional: Negative for chills and fever.  HENT: Negative for ear pain and sore throat.   Eyes: Negative for pain and visual disturbance.  Respiratory:  Negative for cough and shortness of breath.   Cardiovascular: Negative for chest pain and palpitations.  Gastrointestinal: Negative for abdominal pain and vomiting.  Genitourinary: Negative for dysuria and hematuria.  Musculoskeletal: Negative for arthralgias and back pain.  Skin: Negative for color change and rash.  Neurological: Negative for seizures, syncope and headaches.  All other systems reviewed and are negative.    Physical Exam Updated Vital Signs  ED Triage Vitals  Enc Vitals Group     BP 10/08/17 1039 113/63     Pulse Rate 10/08/17 1039 90     Resp 10/08/17 1039 16     Temp 10/08/17 1039 98.3 F (36.8 C)     Temp Source 10/08/17 1039 Oral     SpO2 10/08/17 1039 100 %     Weight 10/08/17 1038 165 lb (74.8 kg)     Height 10/08/17 1038 5\' 4"  (1.626 m)     Head Circumference --      Peak Flow --      Pain Score 10/08/17 1047 7     Pain Loc --      Pain Edu? --      Excl. in [place]? --     Physical Exam  Constitutional: She is oriented to person, place, and time. She appears well-developed and well-nourished. No distress.  HENT:  Head: Normocephalic and atraumatic.  Eyes: Pupils are equal, round, and reactive to light. Conjunctivae and EOM are normal.  Neck: Normal range of motion. Neck supple.  Cardiovascular: Normal rate and regular rhythm.  No murmur heard. Pulmonary/Chest: Effort  normal and breath sounds normal. No respiratory distress.  Abdominal: Soft. There is no tenderness.  Musculoskeletal: Normal range of motion. She exhibits tenderness (TTP to left third metarsal in proximal area). She exhibits no edema or deformity.  Neurological: She is alert and oriented to person, place, and time.  Skin: Skin is warm and dry. Capillary refill takes less than 2 seconds.  Psychiatric: She has a normal mood and affect.  Nursing note and vitals reviewed.    ED Treatments / Results  Labs (all labs ordered are listed, but only abnormal results are displayed) Labs Reviewed - No data to display  EKG None  Radiology Dg Foot Complete Left  Result Date: 10/08/2017 CLINICAL DATA:  Anterior foot pain after hitting it on a bed a few days ago. EXAM: LEFT FOOT - COMPLETE 3+ VIEW COMPARISON:  Left foot x-rays dated October 17, 2015. FINDINGS: Nondisplaced oblique fracture through the third proximal phalanx. Interval Lisfranc repair and first tarsometatarsal joint arthrodesis with mild lucency surrounding the screws and no evidence of solid fusion. Joint spaces are preserved. Mild osteopenia. Soft tissues are unremarkable. Vascular calcifications. IMPRESSION: 1. Acute nondisplaced fracture of the third proximal phalanx. 2. Interval Lisfranc repair and first tarsometatarsal joint arthrodesis with evidence of loosening and no solid fusion. Electronically Signed   By: Titus Dubin M.D.   On: 10/08/2017 11:11    Procedures Procedures (including critical care time)  Medications Ordered in ED Medications - No data to display   Initial Impression / Assessment and Plan / ED Course  I have reviewed the triage vital signs and the nursing notes.  Pertinent labs & imaging results that were available during my care of the patient were reviewed by me and considered in my medical decision making (see chart for details).     Maureen [name] is a 47 year old female who presents to the  ED after stubbing her toe several days  ago.  Normal vitals.  No fever.  Patient is tender in the proximal third phalanx on the left foot.  Has a history of Lisfranc surgery on that foot in the past.  X-rays show nondisplaced fracture of the third phalanx.  Otherwise unremarkable x-ray, surgical repair stable.  Dr. Sharol Given was consulted on the phone and recommends postop shoe, crutches and will follow-up next week.  Recommend ice, Motrin, Tylenol for pain told to return to the ED if symptoms worsen.  Final Clinical Impressions(s) / ED Diagnoses   Final diagnoses:  Closed nondisplaced fracture of proximal phalanx of lesser toe of left foot, initial encounter    ED Discharge Orders    None       Lennice Sites, DO 10/08/17 1157

## 2017-10-08 NOTE — ED Triage Notes (Signed)
Left foot injury three days ago.  Pt states she accidentally kicked her bed.

## 2017-10-18 ENCOUNTER — Ambulatory Visit (INDEPENDENT_AMBULATORY_CARE_PROVIDER_SITE_OTHER): Payer: Medicare Other | Admitting: Orthopedic Surgery

## 2017-10-18 ENCOUNTER — Encounter (INDEPENDENT_AMBULATORY_CARE_PROVIDER_SITE_OTHER): Payer: Self-pay | Admitting: Orthopedic Surgery

## 2017-10-18 VITALS — Ht 64.0 in | Wt 165.0 lb

## 2017-10-18 DIAGNOSIS — S92515A Nondisplaced fracture of proximal phalanx of left lesser toe(s), initial encounter for closed fracture: Secondary | ICD-10-CM | POA: Diagnosis not present

## 2017-10-18 MED ORDER — HYDROCODONE-ACETAMINOPHEN 5-325 MG PO TABS: 1.0000 | ORAL_TABLET | ORAL | 0 refills | Status: DC | PRN

## 2017-10-18 NOTE — Progress Notes (Signed)
Office Visit Note   Patient: Maureen [name]           Date of Birth: Oct 25, 1970           MRN: [id number] Visit Date: 10/18/2017              Requested by: No referring provider defined for this encounter. PCP: System, Pcp Not In  Chief Complaint  Patient presents with  . Left Foot - Pain      HPI: Patient is a 47 year old woman who presents for initial evaluation for an acute fracture proximal phalanx third toe left foot.  Patient is unaware of any specific injury.  She states her toe just started hurting she states she is been in [place] for a family funeral.  Patient's currently wearing flip-flops.  Patient is also status post internal fixation for Lisfranc fracture dislocation patient states she did not follow-up after that surgery because her brother was in the ICU in [place].  Assessment & Plan: Visit Diagnoses:  1. Closed nondisplaced fracture of proximal phalanx of lesser toe of left foot, initial encounter     Plan: Recommended stiff soled walking sneakers prescription provided for Vicodin for pain reevaluate in 4 weeks with repeat 2 view radiographs of the left foot.  Follow-Up Instructions: Return in about 4 weeks (around 11/15/2017).   Ortho Exam  Patient is alert, oriented, no adenopathy, well-dressed, normal affect, normal respiratory effort. Examination patient has good pulses her foot is plantigrade there is no pain with range of motion of the ankle.  Patient has minimal swelling she is point tender to palpation over the third toe.  Radiographs are reviewed which shows calcification of the digital vessels she has a nondisplaced oblique fracture of the proximal phalanx left third toe.  Patient has some lucency around the hardware from her Lisfranc fracture and internal fixation.  Imaging: No results found. No images are attached to the encounter.  Labs: Lab Results  Component Value Date   HGBA1C 9.3 (H) 03/05/2016   HGBA1C 10.4 (H) 11/19/2015    HGBA1C 9.3 02/23/2015   ESRSEDRATE 24 (H) 02/14/2013   ESRSEDRATE 28 (H) 12/18/2012   ESRSEDRATE 48 (H) 05/16/2008   REPTSTATUS 06/18/2016 FINAL 06/17/2016   CULT MULTIPLE SPECIES PRESENT, SUGGEST RECOLLECTION (A) 06/17/2016   LABORGA STAPHYLOCOCCUS SPECIES (COAGULASE NEGATIVE) 07/18/2013     Lab Results  Component Value Date   ALBUMIN 3.7 01/10/2017   ALBUMIN 3.6 06/30/2016   ALBUMIN 3.8 06/17/2016    Body mass index is 28.32 kg/m.  Orders:  No orders of the defined types were placed in this encounter.  Meds ordered this encounter  Medications  . HYDROcodone-acetaminophen (NORCO/VICODIN) 5-325 MG tablet    Sig: Take 1 tablet by mouth every 4 (four) hours as needed for moderate pain.    Dispense:  30 tablet    Refill:  0     Procedures: No procedures performed  Clinical Data: No additional findings.  ROS:  All other systems negative, except as noted in the HPI. Review of Systems  Objective: Vital Signs: Ht 5\' 4"  (1.626 m)   Wt 165 lb (74.8 kg)   BMI 28.32 kg/m   Specialty Comments:  No specialty comments available.  PMFS History: Patient Active Problem List   Diagnosis Date Noted  . OSA on CPAP 05/14/2016  . Obesity (BMI 30-39.9) 05/14/2016  . Acute deep vein thrombosis (DVT) of femoral vein of left lower extremity ([place]) 02/10/2016  . Diabetic polyneuropathy associated  with type 2 diabetes mellitus ([place]) 02/10/2016  . Lisfranc dislocation, left, sequela 12/16/2015  . Short Achilles tendon (acquired), left ankle 12/16/2015  . Chest pain with moderate risk for cardiac etiology 11/29/2015  . Elevated troponin 11/19/2015  . Migraine headache 11/19/2015  . Neck pain 11/19/2015  . Acute kidney injury ([place]) 02/19/2015  . DKA (diabetic ketoacidoses) ([place]) 10/24/2014  . HLD (hyperlipidemia) 10/24/2014  . Abnormal LFTs 09/03/2014  . Dizziness 09/03/2014  . Acute pulmonary embolism ([place]) 09/03/2014  . Transaminitis 09/02/2014  . GAD (generalized anxiety  disorder) 11/23/2013  . PTSD (post-traumatic stress disorder) 11/23/2013  . Severe major depression without psychotic features ([place]) 10/10/2013  . Endometriosis 12/08/2011  . Arthritis of left knee 12/08/2011  . Diabetic retinopathy ([place]) 12/08/2011  . Fibromyalgia 10/23/2011  . DM type 1 (diabetes mellitus, type 1) ([place]) 10/13/2011   Past Medical History:  Diagnosis Date  . Abdominal pain   . Achilles tendon contracture, left    with lisfrabc fracture  . Anxiety   . Arthritis   . Biliary dyskinesia   . Cervical strain   . Closed head injury 2010  . Depression   . DVT (deep venous thrombosis) ([place])   . Elevated LFTs   . Fibromyalgia   . GERD (gastroesophageal reflux disease)   . History of kidney stones   . Hyperlipidemia   . Hypoglycemia   . Joint pain   . Major depressive disorder   . Migraine    hx of none recent  . Nausea & vomiting   . OSA on CPAP 05/14/2016   Moderate to severe OSA with an AHI of 28/hr now on CPAP at 19cm H2O  . Pneumonia   . Post traumatic stress disorder (PTSD)   . Pulmonary embolism ([place])   . Renal stones   . Skin rash    due to medications  . Sleep trouble   . Stroke [facility])    " series of mini strokes around 2007"  . TIA (transient ischemic attack)   . Wears glasses   . 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 Cancer Father        lymphoma  . Nephrolithiasis Father   . Vascular Disease Father   . Alcohol abuse Father   . Drug abuse Father   . Cancer Maternal Grandmother        colon  . Hypertension Mother   . Heart disease Mother   . Cataracts Mother   . Depression Mother   . Diabetes Brother   . Drug abuse Brother   . Mental illness Maternal Grandfather   . Cancer Maternal Grandfather   . Heart disease Paternal Grandmother   . Heart disease Paternal Grandfather   . Suicidality Maternal Uncle     Past Surgical History:    Procedure Laterality Date  . ABDOMINAL HYSTERECTOMY  2001  . APPENDECTOMY    . [place]  . CHOLECYSTECTOMY  02/10/2011   Procedure: LAPAROSCOPIC CHOLECYSTECTOMY WITH INTRAOPERATIVE CHOLANGIOGRAM;  Surgeon: Judieth Keens, DO;  Location: [place] [facility];  Service: General;  Laterality: N/A;  . COLONOSCOPY    . DILATION AND CURETTAGE OF UTERUS    . exploratory laparomty  Mar 04, 2012  . EYE SURGERY  2006   laser eye surgery left eye  . KNEE SURGERY Left 2012  . LAPAROSCOPIC APPENDECTOMY N/A 10/12/2012  Procedure: DIAGNOSTIC APPENDECTOMY LAPAROSCOPIC;  Surgeon: Madilyn Hook, DO;  Location: [facility];  Service: General;  Laterality: N/A;  . lipoma removed  yrs ago  . LIVER BIOPSY    . ORIF ANKLE FRACTURE Left 01/03/2016   Procedure: OPEN REDUCTION INTERNAL FIXATION (ORIF) LEFT LISFRANC JOINT, GASTROC RECESSION;  Surgeon: Newt Minion, MD;  Location: [place];  Service: Orthopedics;  Laterality: Left;  . ROBOTIC ASSISTED LAPAROSCOPIC LYSIS OF ADHESION N/A 03/04/2012   Procedure: ROBOTIC ASSISTED LAPAROSCOPIC LYSIS OF EXTENSIVE ADHESIONS;  Surgeon: Claiborne Billings A. Pamala Hurry, MD;  Location: [facility];  Service: Gynecology;  Laterality: N/A;  . TONSILLECTOMY  2001   Social History   Occupational History  . Occupation: stay at home mom    Employer: NOT EMPLOYED  Tobacco Use  . Smoking status: Never Smoker  . Smokeless tobacco: Never Used  Substance and Sexual Activity  . Alcohol use: Yes    Comment: Socially  . Drug use: No  . Sexual activity: Not on file

## 2017-11-15 ENCOUNTER — Ambulatory Visit (INDEPENDENT_AMBULATORY_CARE_PROVIDER_SITE_OTHER): Payer: Medicare Other | Admitting: Orthopedic Surgery

## 2017-11-16 ENCOUNTER — Other Ambulatory Visit: Payer: Self-pay

## 2017-11-16 ENCOUNTER — Encounter (HOSPITAL_BASED_OUTPATIENT_CLINIC_OR_DEPARTMENT_OTHER): Payer: Self-pay

## 2017-11-16 ENCOUNTER — Emergency Department (HOSPITAL_BASED_OUTPATIENT_CLINIC_OR_DEPARTMENT_OTHER)
Admission: EM | Admit: 2017-11-16 | Discharge: 2017-11-17 | Disposition: A | Discharge status: Home / Self Care | Payer: Medicare Other | Attending: Emergency Medicine | Admitting: Emergency Medicine

## 2017-11-16 ENCOUNTER — Emergency Department (HOSPITAL_BASED_OUTPATIENT_CLINIC_OR_DEPARTMENT_OTHER): Payer: Medicare Other

## 2017-11-16 DIAGNOSIS — E109 Type 1 diabetes mellitus without complications: Secondary | ICD-10-CM | POA: Insufficient documentation

## 2017-11-16 DIAGNOSIS — R0609 Other forms of dyspnea: Secondary | ICD-10-CM | POA: Insufficient documentation

## 2017-11-16 DIAGNOSIS — Z7901 Long term (current) use of anticoagulants: Secondary | ICD-10-CM | POA: Insufficient documentation

## 2017-11-16 DIAGNOSIS — R0789 Other chest pain: Secondary | ICD-10-CM | POA: Insufficient documentation

## 2017-11-16 DIAGNOSIS — F419 Anxiety disorder, unspecified: Secondary | ICD-10-CM | POA: Insufficient documentation

## 2017-11-16 DIAGNOSIS — Z79899 Other long term (current) drug therapy: Secondary | ICD-10-CM | POA: Diagnosis not present

## 2017-11-16 DIAGNOSIS — R112 Nausea with vomiting, unspecified: Secondary | ICD-10-CM

## 2017-11-16 DIAGNOSIS — Z9049 Acquired absence of other specified parts of digestive tract: Secondary | ICD-10-CM | POA: Diagnosis not present

## 2017-11-16 DIAGNOSIS — F329 Major depressive disorder, single episode, unspecified: Secondary | ICD-10-CM | POA: Diagnosis not present

## 2017-11-16 DIAGNOSIS — Z794 Long term (current) use of insulin: Secondary | ICD-10-CM | POA: Diagnosis not present

## 2017-11-16 DIAGNOSIS — Z8673 Personal history of transient ischemic attack (TIA), and cerebral infarction without residual deficits: Secondary | ICD-10-CM | POA: Diagnosis not present

## 2017-11-16 LAB — BASIC METABOLIC PANEL
Anion gap: 13 (ref 5–15)
BUN: 12 mg/dL (ref 6–20)
CO2: 24 mmol/L (ref 22–32)
Calcium: 9.6 mg/dL (ref 8.9–10.3)
Chloride: 98 mmol/L (ref 98–111)
Creatinine, Ser: 1 mg/dL (ref 0.44–1.00)
GFR calc Af Amer: >60 mL/min (ref >60)
GFR calc non Af Amer: >60 mL/min (ref >60)
Glucose, Bld: 288 mg/dL — ABNORMAL HIGH (ref 70–99)
Potassium: 4.2 mmol/L (ref 3.5–5.1)
Sodium: 135 mmol/L (ref 135–145)

## 2017-11-16 LAB — CBC
HCT: 47.7 % — ABNORMAL HIGH (ref 36.0–46.0)
Hemoglobin: 15.4 g/dL — ABNORMAL HIGH (ref 12.0–15.0)
MCH: 30.9 pg (ref 26.0–34.0)
MCHC: 32.3 g/dL (ref 30.0–36.0)
MCV: 95.8 fL (ref 80.0–100.0)
Platelets: 384 10*3/uL (ref 150–400)
RBC: 4.98 MIL/uL (ref 3.87–5.11)
RDW: 12.8 % (ref 11.5–15.5)
WBC: 10.5 10*3/uL (ref 4.0–10.5)
nRBC: 0 % (ref 0.0–0.2)

## 2017-11-16 LAB — TROPONIN I: Troponin I: <0.03 ng/mL (ref <0.03)

## 2017-11-16 NOTE — ED Triage Notes (Signed)
C/o CP x 4 days-NAD-to triage in w/c

## 2017-11-17 ENCOUNTER — Encounter (HOSPITAL_BASED_OUTPATIENT_CLINIC_OR_DEPARTMENT_OTHER): Payer: Self-pay | Admitting: Emergency Medicine

## 2017-11-17 LAB — PROTIME-INR
INR: 1.51
Prothrombin Time: 18.1 seconds — ABNORMAL HIGH (ref 11.4–15.2)

## 2017-11-17 LAB — D-DIMER, QUANTITATIVE (NOT AT ARMC): D-Dimer, Quant: 0.29 ug/mL-FEU (ref 0.00–0.50)

## 2017-11-17 LAB — BRAIN NATRIURETIC PEPTIDE: B Natriuretic Peptide: 12 pg/mL (ref 0.0–100.0)

## 2017-11-17 LAB — TROPONIN I: Troponin I: <0.03 ng/mL (ref <0.03)

## 2017-11-17 MED ORDER — WARFARIN SODIUM 5 MG PO TABS
5.0000 mg | ORAL_TABLET | Freq: Once | ORAL | Status: AC
  Administered 2017-11-17: 5 mg via ORAL
  Filled 2017-11-17: qty 1

## 2017-11-17 MED ORDER — INSULIN ASPART 100 UNIT/ML ~~LOC~~ SOLN
5.0000 [IU] | Freq: Once | SUBCUTANEOUS | Status: AC
  Administered 2017-11-17: 5 [IU] via SUBCUTANEOUS
  Filled 2017-11-17: qty 1

## 2017-11-17 MED ORDER — PROMETHAZINE HCL 25 MG/ML IJ SOLN
12.5000 mg | Freq: Once | INTRAMUSCULAR | Status: AC
  Administered 2017-11-17: 12.5 mg via INTRAVENOUS
  Filled 2017-11-17: qty 1

## 2017-11-17 MED ORDER — PROMETHAZINE HCL 25 MG RE SUPP: 25.0000 mg | Freq: Four times a day (QID) | RECTAL | 0 refills | Status: DC | PRN

## 2017-11-17 MED ORDER — METOCLOPRAMIDE HCL 5 MG/ML IJ SOLN
10.0000 mg | Freq: Once | INTRAMUSCULAR | Status: DC
  Filled 2017-11-17: qty 2

## 2017-11-17 MED ORDER — SODIUM CHLORIDE 0.9 % IV BOLUS
1000.0000 mL | Freq: Once | INTRAVENOUS | Status: AC
  Administered 2017-11-17: 1000 mL via INTRAVENOUS

## 2017-11-17 MED ORDER — ONDANSETRON HCL 4 MG/2ML IJ SOLN
4.0000 mg | Freq: Once | INTRAMUSCULAR | Status: AC
  Administered 2017-11-17: 4 mg via INTRAVENOUS
  Filled 2017-11-17: qty 2

## 2017-11-17 NOTE — ED Provider Notes (Signed)
[facility] DEPT [facility] Provider Note: Georgena Spurling, MD, FACEP  CSN: [id number] MRN: [id number] ARRIVAL: 11/16/17 at 2046 ROOM: [place]  Chest Pain   HISTORY OF PRESENT ILLNESS  11/17/17 12:16 AM Maureen [name] is a 47 y.o. female with a history of type 1 diabetes.  Four days ago she developed nausea and vomiting which made her think she might be going into diabetic ketoacidosis. She has subsequently gotten better but was unable to take her medications since this began.  She has also had chest pain during this time which she describes as a sensation of static electricity in her precordium radiating to her shoulders and neck.  This pain may last as long as hours.  It has been an 8 out of 10 at its worst.  It is exacerbated by exertion and deep breathing.  She has also had dyspnea on exertion.  She continues to be nauseated.  She has some mild diffuse abdominal pain which she attributes to vomiting.   Past Medical History:  Diagnosis Date  . Abdominal pain   . Achilles tendon contracture, left    with Lisfranc fracture  . Anxiety   . Arthritis   . Biliary dyskinesia   . Cervical strain   . Closed head injury 2010  . Depression   . DVT (deep venous thrombosis) ([place])   . Elevated LFTs   . Fibromyalgia   . GERD (gastroesophageal reflux disease)   . History of kidney stones   . Hyperlipidemia   . Hypoglycemia   . Joint pain   . Major depressive disorder   . Migraine    hx of none recent  . Nausea & vomiting   . OSA on CPAP 05/14/2016   Moderate to severe OSA with an AHI of 28/hr now on CPAP at 19cm H2O  . Pneumonia   . Post traumatic stress disorder (PTSD)   . Pulmonary embolism ([place])   . Renal stones   . Skin rash    due to medications  . Sleep trouble   . Stroke [facility])    " series of mini strokes around 2007"  . TIA (transient ischemic attack)   . Wears glasses   . Well controlled type 1 diabetes mellitus with peripheral neuropathy ([place])    Type 1    Past Surgical History:  Procedure Laterality Date  . ABDOMINAL HYSTERECTOMY  2001  . APPENDECTOMY    . [place]  . CHOLECYSTECTOMY  02/10/2011   Procedure: LAPAROSCOPIC CHOLECYSTECTOMY WITH INTRAOPERATIVE CHOLANGIOGRAM;  Surgeon: Judieth Keens, DO;  Location: [facility];  Service: General;  Laterality: N/A;  . COLONOSCOPY    . DILATION AND CURETTAGE OF UTERUS    . exploratory laparomty  Mar 04, 2012  . EYE SURGERY  2006   laser eye surgery left eye  . KNEE SURGERY Left 2012  . LAPAROSCOPIC APPENDECTOMY N/A 10/12/2012   Procedure: DIAGNOSTIC APPENDECTOMY LAPAROSCOPIC;  Surgeon: Madilyn Hook, DO;  [facility];  Service: General;  Laterality: N/A;  . lipoma removed  yrs ago  . LIVER BIOPSY    . ORIF ANKLE FRACTURE Left 01/03/2016   Procedure: OPEN REDUCTION INTERNAL FIXATION (ORIF) LEFT LISFRANC JOINT, GASTROC RECESSION;  Surgeon: Newt Minion, MD;  Location: [facility];  Service: Orthopedics;  Laterality: Left;  . ROBOTIC ASSISTED LAPAROSCOPIC LYSIS OF ADHESION N/A 03/04/2012   Procedure: ROBOTIC ASSISTED LAPAROSCOPIC LYSIS OF EXTENSIVE ADHESIONS;  Surgeon: Claiborne Billings A. Pamala Hurry, MD;  Location: [facility]  ORS;  Service: Gynecology;  Laterality: N/A;  . TONSILLECTOMY  2001    Family History  Problem Relation Age of Onset  . Cancer Father        lymphoma  . Nephrolithiasis Father   . Vascular Disease Father   . Alcohol abuse Father   . Drug abuse Father   . Cancer Maternal Grandmother        colon  . Hypertension Mother   . Heart disease Mother   . Cataracts Mother   . Depression Mother   . Diabetes Brother   . Drug abuse Brother   . Mental illness Maternal Grandfather   . Cancer Maternal Grandfather   . Heart disease Paternal Grandmother   . Heart disease Paternal Grandfather   . Suicidality Maternal Uncle     Social History   Tobacco Use  . Smoking status: Never Smoker  . Smokeless tobacco: Never Used  Substance Use Topics  . Alcohol use: Yes     Comment: occ  . Drug use: No    Prior to Admission medications   Medication Sig Start Date End Date Taking? Authorizing Provider  atorvastatin (LIPITOR) 40 MG tablet Take 40 mg by mouth at bedtime.     [provider]  HYDROcodone-acetaminophen (NORCO/VICODIN) 5-325 MG tablet Take 1 tablet by mouth every 4 (four) hours as needed for moderate pain. 10/18/17   Newt Minion, MD  insulin aspart (NOVOLOG) 100 UNIT/ML injection Inject 8 Units into the skin 3 (three) times daily with meals. 03/05/16   [place], FNP  insulin glargine (LANTUS) 100 UNIT/ML injection Inject 0.28 mLs (28 Units total) into the skin at bedtime. Patient taking differently: Inject 30 Units into the skin at bedtime.  03/05/16   [place], FNP  lidocaine (LIDODERM) 5 % Place 1 patch onto the skin daily as needed (pain). Remove after 12 hours. 10/30/15   [provider]  losartan (COZAAR) 25 MG tablet Take by mouth. 08/18/17   [provider]  traMADol (ULTRAM) 50 MG tablet Take by mouth. 10/04/17   [provider]  venlafaxine XR (EFFEXOR-XR) 37.5 MG 24 hr capsule Take by mouth. 10/02/17   [provider]  warfarin (COUMADIN) 5 MG tablet TAKE 1 TABLET BY MOUTH DAILY AT 6PM. TAKE 1 & 1/2 TABLET ON [facility] 07/06/16   [provider]    Allergies Cephalexin; Ibuprofen; Meloxicam; and Reglan [metoclopramide]   REVIEW OF SYSTEMS  Negative except as noted here or in the History of Present Illness.   PHYSICAL EXAMINATION  Initial Vital Signs Blood pressure (!) 155/60, pulse 83, temperature 98.8 F (37.1 C), temperature source Oral, resp. rate 14, height 5\' 4"  (1.626 m), weight 75.8 kg, SpO2 100 %.  Examination General: Well-developed, well-nourished female in no acute distress; appearance consistent with age of record HENT: normocephalic; atraumatic Eyes: pupils equal, round and reactive to light; extraocular muscles intact Neck: supple Heart: regular  rate and rhythm Lungs: clear to auscultation bilaterally Abdomen: soft; nondistended; mild diffuse tenderness; bowel sounds present Extremities: No deformity; full range of motion; pulses normal Neurologic: Awake, alert and oriented; motor function intact in all extremities and symmetric; no facial droop Skin: Warm and dry Psychiatric: Normal mood and affect   RESULTS  Summary of this visit's results, reviewed by myself:   EKG Interpretation  Date/Time:  Tuesday November 16 2017 20:54:48 EDT Ventricular Rate:  107 PR Interval:  128 QRS Duration: 70 QT Interval:  312 QTC Calculation: 416 R Axis:  99 Text Interpretation:  Sinus tachycardia Right atrial enlargement Rightward axis Borderline ECG Rate is faster Confirmed by Noelia Lenart, Jenny Reichmann [phone]) on 11/17/2017 12:16:54 AM      Laboratory Studies: Results for orders placed or performed during the hospital encounter of 11/16/17 (from the past 24 hour(s))  Basic metabolic panel     Status: Abnormal   Collection Time: 11/16/17  9:29 PM  Result Value Ref Range   Sodium 135 135 - 145 mmol/L   Potassium 4.2 3.5 - 5.1 mmol/L   Chloride 98 98 - 111 mmol/L   CO2 24 22 - 32 mmol/L   Glucose, Bld 288 (H) 70 - 99 mg/dL   BUN 12 6 - 20 mg/dL   Creatinine, Ser 1.00 0.44 - 1.00 mg/dL   Calcium 9.6 8.9 - 10.3 mg/dL   GFR calc non Af Amer >60 >60 mL/min   GFR calc Af Amer >60 >60 mL/min   Anion gap 13 5 - 15  CBC     Status: Abnormal   Collection Time: 11/16/17  9:29 PM  Result Value Ref Range   WBC 10.5 4.0 - 10.5 K/uL   RBC 4.98 3.87 - 5.11 MIL/uL   Hemoglobin 15.4 (H) 12.0 - 15.0 g/dL   HCT 47.7 (H) 36.0 - 46.0 %   MCV 95.8 80.0 - 100.0 fL   MCH 30.9 26.0 - 34.0 pg   MCHC 32.3 30.0 - 36.0 g/dL   RDW 12.8 11.5 - 15.5 %   Platelets 384 150 - 400 K/uL   nRBC 0.0 0.0 - 0.2 %  Troponin I     Status: None   Collection Time: 11/16/17  9:29 PM  Result Value Ref Range   Troponin I <0.03 <0.03 ng/mL  Protime-INR     Status: Abnormal    Collection Time: 11/17/17 12:36 AM  Result Value Ref Range   Prothrombin Time 18.1 (H) 11.4 - 15.2 seconds   INR 1.51   D-dimer, quantitative (not at [facility])     Status: None   Collection Time: 11/17/17 12:36 AM  Result Value Ref Range   D-Dimer, Quant 0.29 0.00 - 0.50 ug/mL-FEU  Brain natriuretic peptide     Status: None   Collection Time: 11/17/17 12:40 AM  Result Value Ref Range   B Natriuretic Peptide 12.0 0.0 - 100.0 pg/mL  Troponin I     Status: None   Collection Time: 11/17/17  2:00 AM  Result Value Ref Range   Troponin I <0.03 <0.03 ng/mL   Imaging Studies: Dg Chest 2 View  Result Date: 11/16/2017 CLINICAL DATA:  Chest pain for 4 days EXAM: CHEST - 2 VIEW COMPARISON:  Jun 17, 2016 FINDINGS: The heart size and mediastinal contours are within normal limits. Both lungs are clear. The visualized skeletal structures are unremarkable. IMPRESSION: No active cardiopulmonary disease. Electronically Signed   By: Abelardo Diesel M.D.   On: 11/16/2017 21:47    ED COURSE and MDM  Nursing notes and initial vitals signs, including pulse oximetry, reviewed.  Vitals:   11/17/17 0155 11/17/17 0200 11/17/17 0230 11/17/17 0330  BP: 140/75 (!) 155/75 140/79 (!) 151/70  Pulse: 72 78 75 81  Resp: 17 16 13 15   Temp:      TempSrc:      SpO2: 100% 99% 100% 100%  Weight:      Height:       3:01 AM Patient still complaining of nausea despite Zofran and IV fluids.  4:15 AM Nausea improved with IV Phenergan.  Patient has had 2 negative troponins.  She has ambulated around the department without dyspnea or chest pain.   4:33 AM No evidence of MI on EKG or troponin.  Pain is atypical in nature.  It is not currently repeatable on exertion.  We will have the patient restart her Coumadin and return should symptoms worsen.  Differential diagnosis includes esophageal irritation due to recent vomiting.  Her d-dimer is within normal limits.  PROCEDURES    ED DIAGNOSES     ICD-10-CM   1. Atypical  chest pain R07.89   2. Nausea and vomiting in adult R11.2        Shanon Rosser, MD 11/17/17 [phone]

## 2017-11-17 NOTE — ED Notes (Signed)
PT states understanding of care given, follow up care. PT ambulated from ED to car with a steady gait.  

## 2017-11-18 LAB — POCT I-STAT 3, VENOUS BLOOD GAS (G3P V)
Acid-Base Excess: 7 mmol/L — ABNORMAL HIGH (ref 0.0–2.0)
Bicarbonate: 33.5 mmol/L — ABNORMAL HIGH (ref 20.0–28.0)
O2 Saturation: 36 %
Patient temperature: 98.3
TCO2: 35 mmol/L — ABNORMAL HIGH (ref 22–32)
pCO2, Ven: 52.5 mmHg (ref 44.0–60.0)
pH, Ven: 7.411 (ref 7.250–7.430)
pO2, Ven: 22 mmHg — CL (ref 32.0–45.0)

## 2018-01-03 IMAGING — CT CT ANGIO CHEST
2 of 6 series · 19 of 36 positions shown · IV contrast (ISOVUE 370)
Comparison: 02/02/2016

CLINICAL DATA: Right leg pain and central chest pain. Pain began on
[REDACTED] continued [REDACTED] and today. Worse tonight. Nausea. History
of DVT and pulmonary embolus.

EXAM:
CT ANGIOGRAPHY CHEST WITH CONTRAST
TECHNIQUE: Multidetector CT imaging of the chest was performed using the
standard protocol during bolus administration of intravenous
contrast. Multiplanar CT image reconstructions and MIPs were
obtained to evaluate the vascular anatomy.
CONTRAST:  100 mL Isovue 370

[Series 7: thins for pacs · axial · 0.62mm/px · z∈[-266,-13]mm · 18 of 283 slices shown]
[im 15/283  lung]
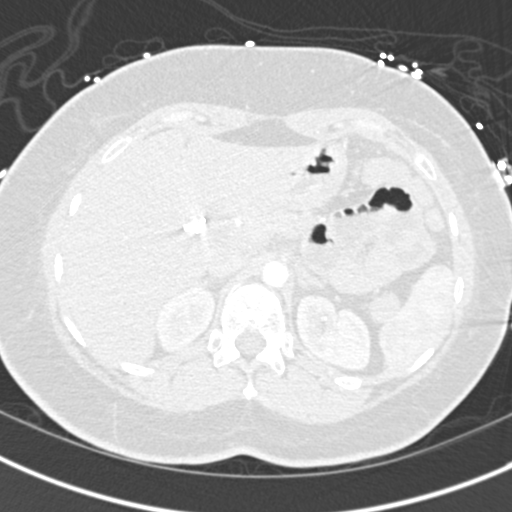
[im 29/283  mediastinal]
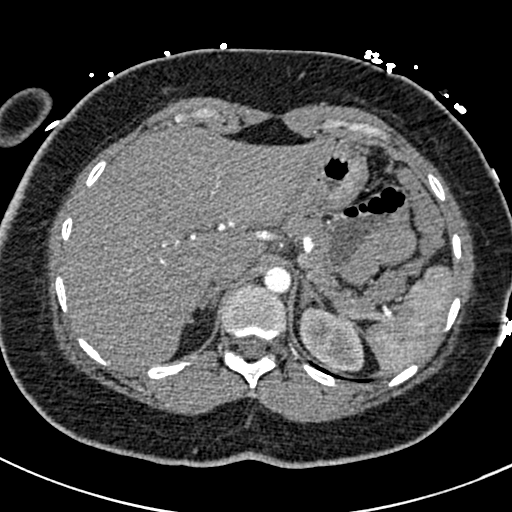
[im 43/283  lung]
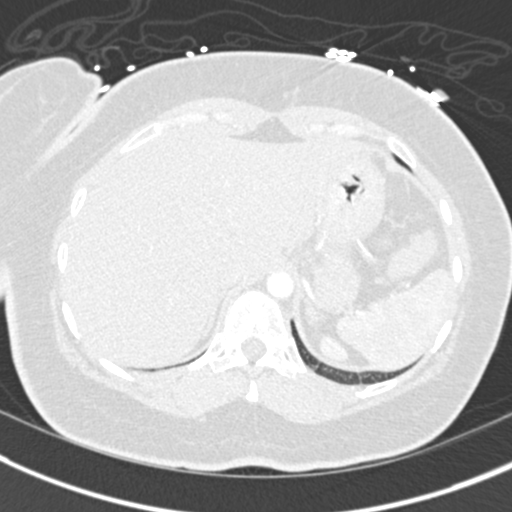
[im 57/283  mediastinal]
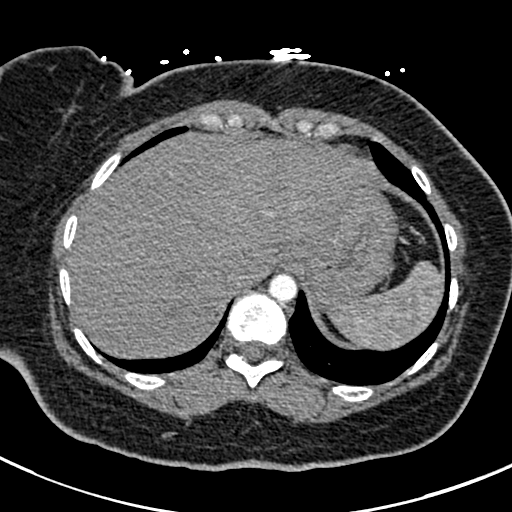
[im 71/283  lung]
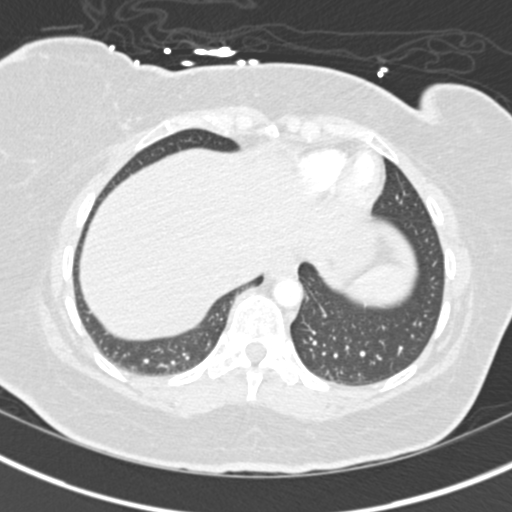
[im 85/283  mediastinal]
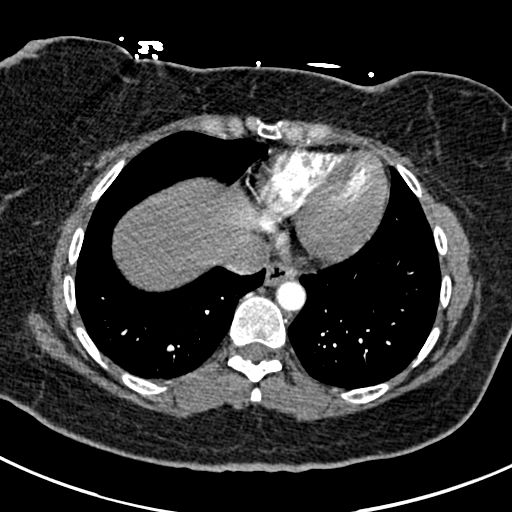
[im 99/283  lung]
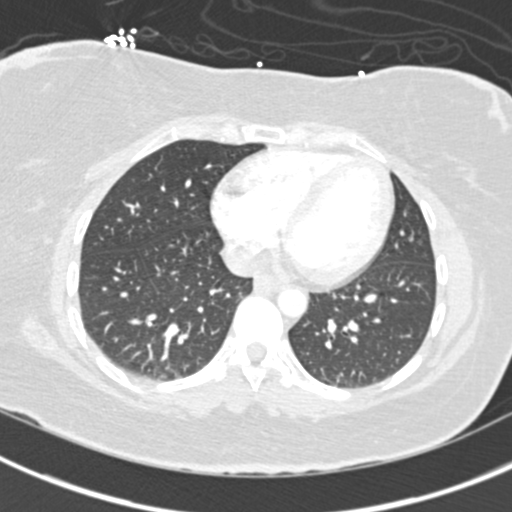
[im 113/283  mediastinal]
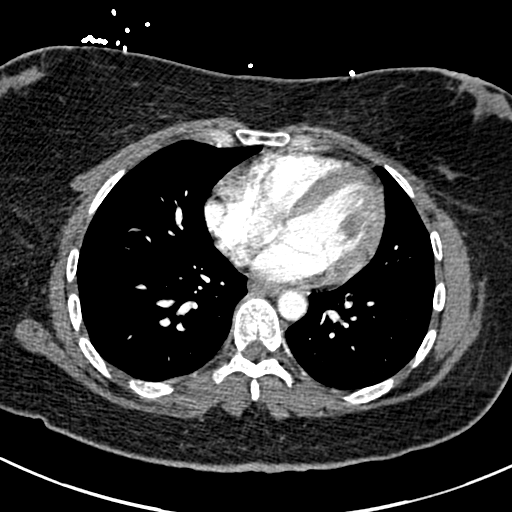
[im 127/283  lung]
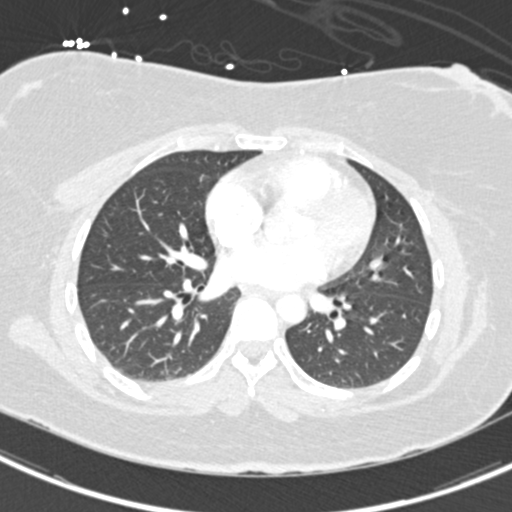
[im 156/283  mediastinal]
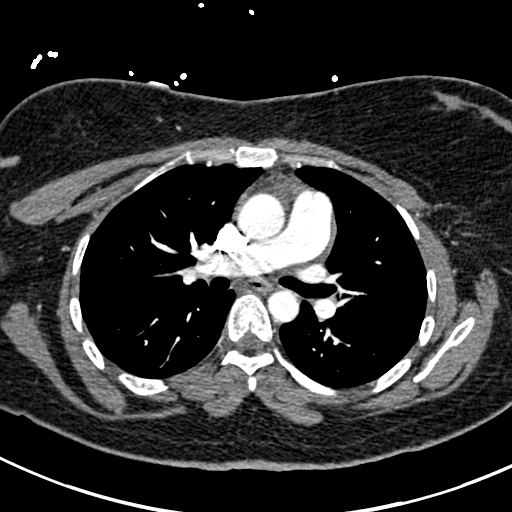
[im 170/283  lung]
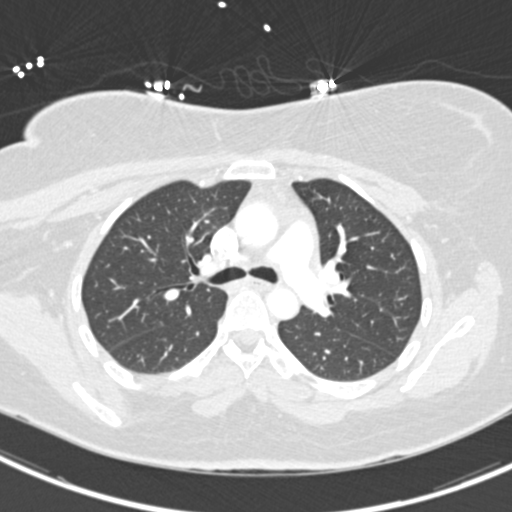
[im 184/283  mediastinal]
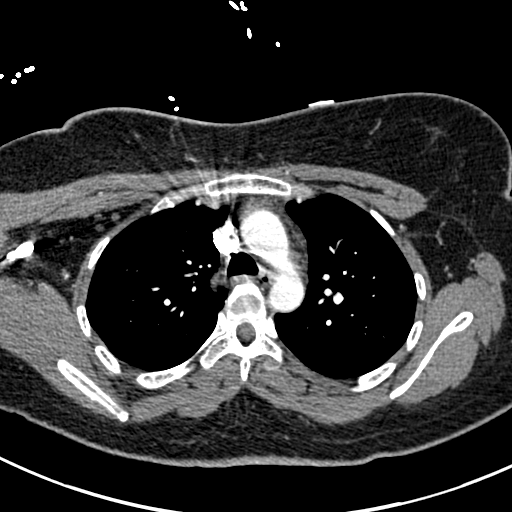
[im 198/283  lung]
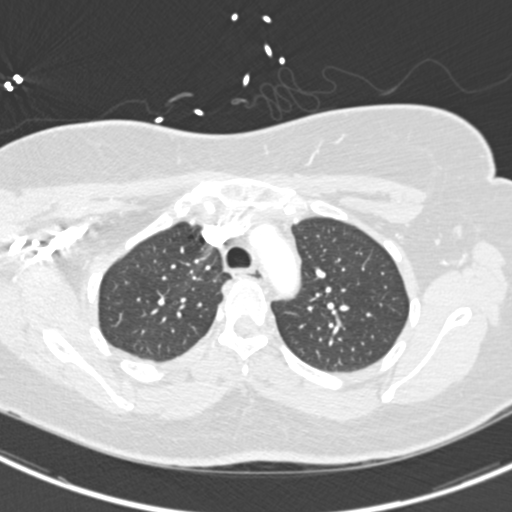
[im 212/283  mediastinal]
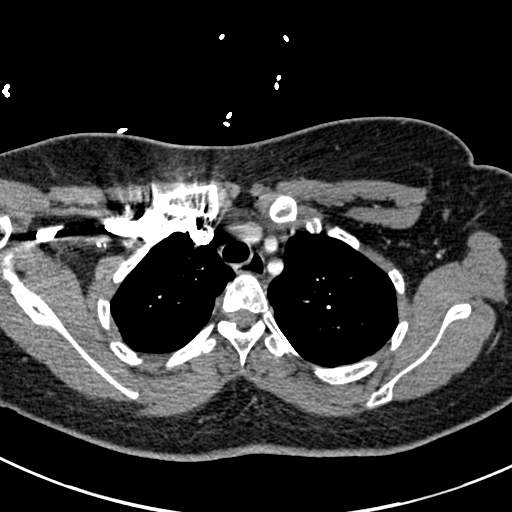
[im 226/283  lung]
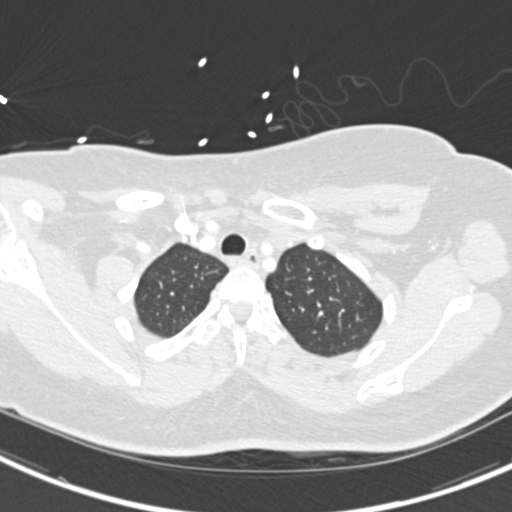
[im 240/283  mediastinal]
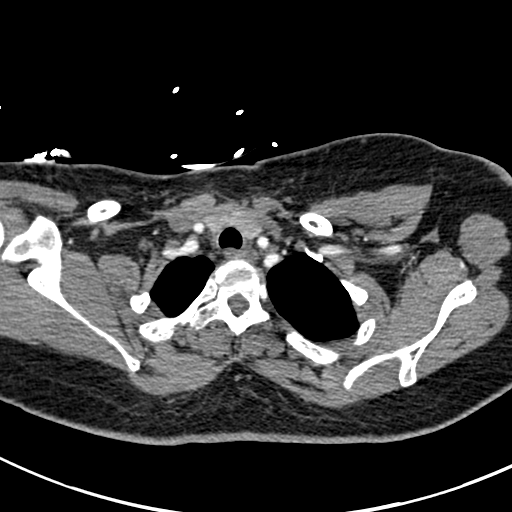
[im 254/283  lung]
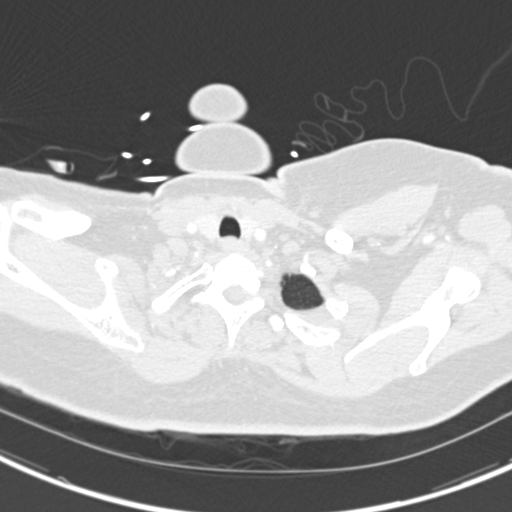
[im 268/283  mediastinal]
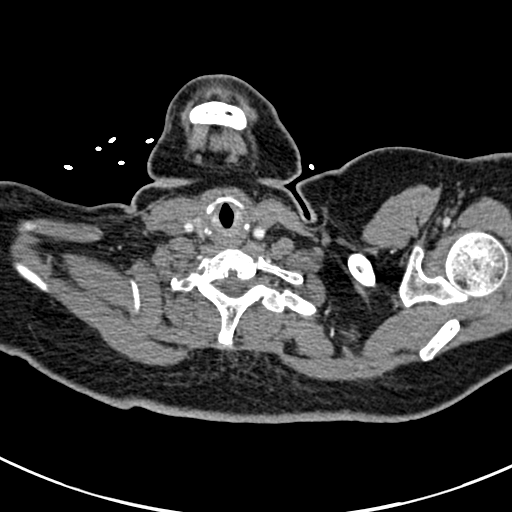

[Series 9: coronal mpr · coronal · 0.57mm/px · 1 of 151 slices shown]
[im 76/151  mediastinal]
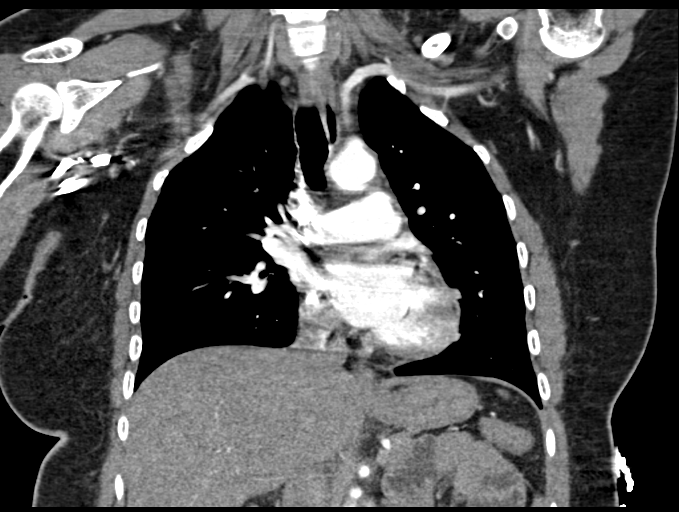

[19 of 36 positions shown; findings below may reference images not displayed]

FINDINGS: Cardiovascular: Good opacification of the central and segmental
pulmonary arteries. No focal filling defects. No evidence of
significant pulmonary embolus. Normal heart size. Normal caliber
thoracic aorta. No aortic dissection. Great vessel origins are
patent.

Mediastinum/Nodes: No enlarged mediastinal, hilar, or axillary lymph
nodes. Thyroid gland, trachea, and esophagus demonstrate no
significant findings.

Lungs/Pleura: Lungs are clear. No pleural effusion or pneumothorax.

Upper Abdomen: No acute abnormality.

Musculoskeletal: No chest wall abnormality. No acute or significant
osseous findings.

Review of the MIP images confirms the above findings.
IMPRESSION: No significant pulmonary embolus. No evidence of active pulmonary
disease.

## 2018-01-03 IMAGING — CR DG CHEST 2V
2 series · 2 of 2 positions shown · non-contrast
Comparison: CXR 08/11/2015, chest CT 02/02/2016

CLINICAL DATA: Right leg and central chest pain since [REDACTED]

EXAM:
CHEST  2 VIEW

[w chest pa]
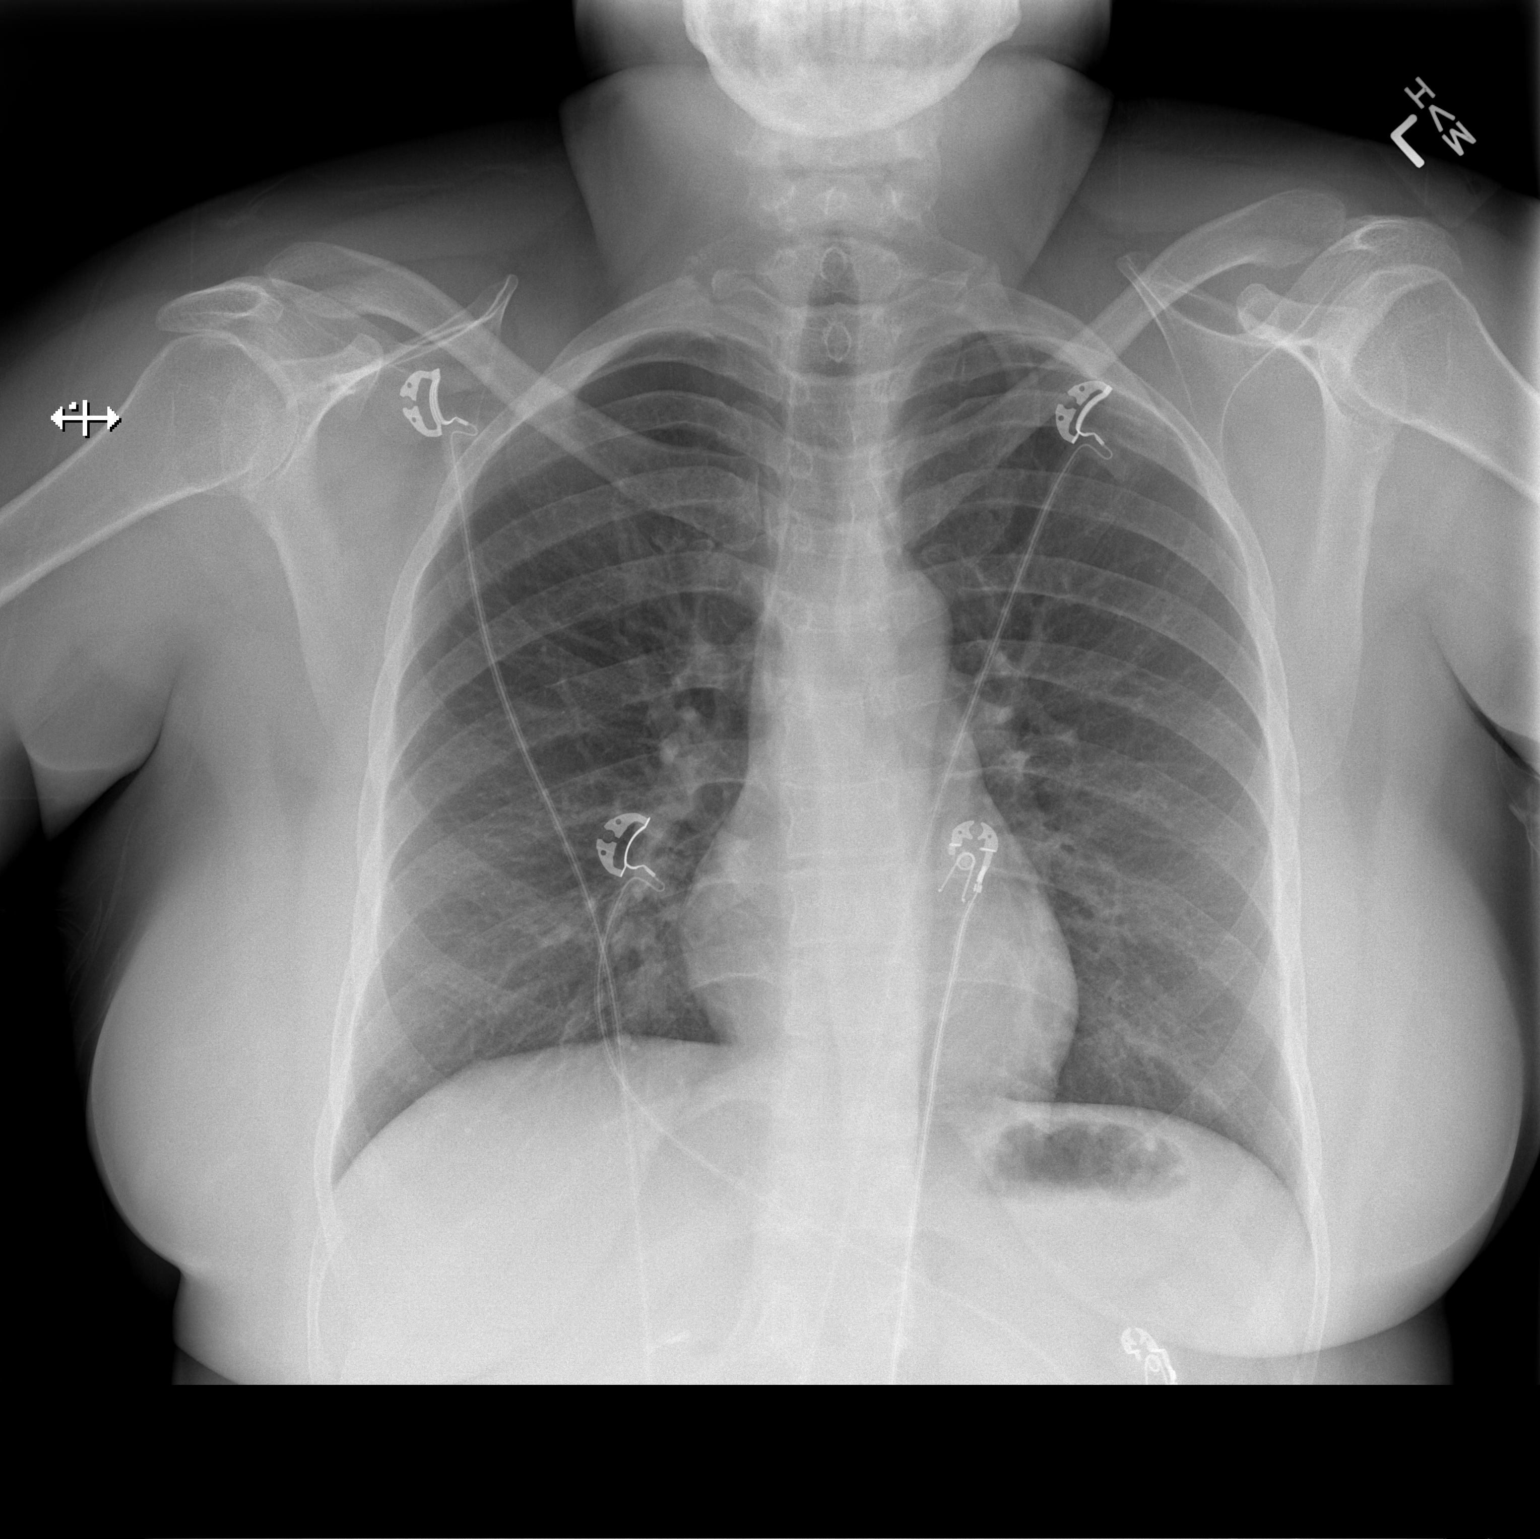

[w chest lat]
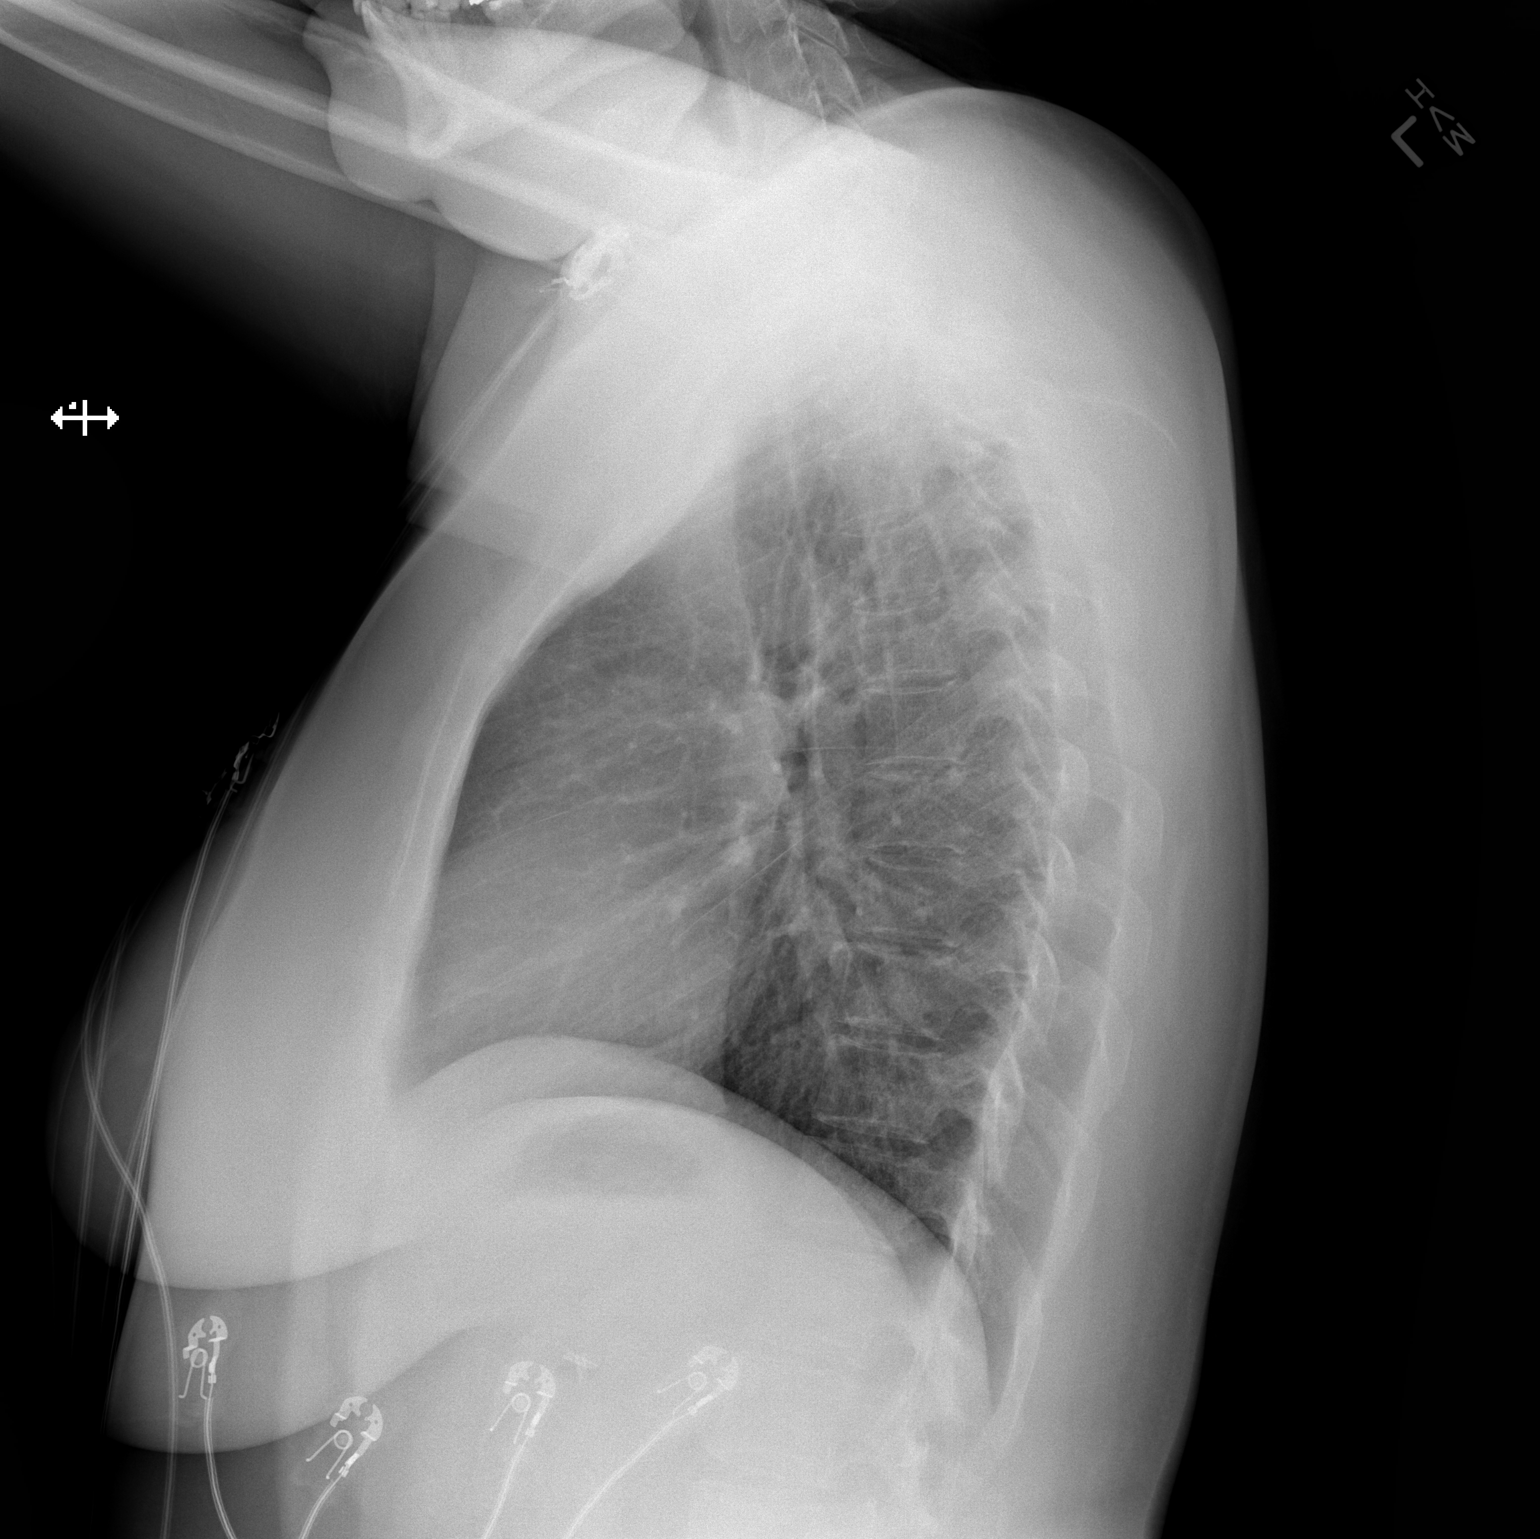

[2 of 2 positions shown; findings below may reference images not displayed]

FINDINGS: The heart size and mediastinal contours are within normal limits.
Both lungs are clear. The visualized skeletal structures are
unremarkable.
IMPRESSION: No active cardiopulmonary disease.

## 2018-01-31 ENCOUNTER — Encounter (HOSPITAL_BASED_OUTPATIENT_CLINIC_OR_DEPARTMENT_OTHER): Payer: Self-pay | Admitting: Emergency Medicine

## 2018-01-31 ENCOUNTER — Inpatient Hospital Stay (HOSPITAL_BASED_OUTPATIENT_CLINIC_OR_DEPARTMENT_OTHER)
Admission: EM | Admit: 2018-01-31 | Discharge: 2018-02-03 | DRG: 638 | Disposition: A | Discharge status: Home / Self Care | Payer: Medicare Other | Attending: Internal Medicine | Admitting: Internal Medicine

## 2018-01-31 ENCOUNTER — Emergency Department (HOSPITAL_BASED_OUTPATIENT_CLINIC_OR_DEPARTMENT_OTHER): Payer: Medicare Other

## 2018-01-31 ENCOUNTER — Other Ambulatory Visit: Payer: Self-pay

## 2018-01-31 DIAGNOSIS — N179 Acute kidney failure, unspecified: Secondary | ICD-10-CM | POA: Diagnosis present

## 2018-01-31 DIAGNOSIS — E663 Overweight: Secondary | ICD-10-CM | POA: Diagnosis present

## 2018-01-31 DIAGNOSIS — M797 Fibromyalgia: Secondary | ICD-10-CM | POA: Diagnosis not present

## 2018-01-31 DIAGNOSIS — Z86718 Personal history of other venous thrombosis and embolism: Secondary | ICD-10-CM

## 2018-01-31 DIAGNOSIS — I951 Orthostatic hypotension: Secondary | ICD-10-CM | POA: Diagnosis not present

## 2018-01-31 DIAGNOSIS — Z90710 Acquired absence of both cervix and uterus: Secondary | ICD-10-CM | POA: Diagnosis not present

## 2018-01-31 DIAGNOSIS — F419 Anxiety disorder, unspecified: Secondary | ICD-10-CM

## 2018-01-31 DIAGNOSIS — R55 Syncope and collapse: Secondary | ICD-10-CM

## 2018-01-31 DIAGNOSIS — F329 Major depressive disorder, single episode, unspecified: Secondary | ICD-10-CM | POA: Diagnosis not present

## 2018-01-31 DIAGNOSIS — K219 Gastro-esophageal reflux disease without esophagitis: Secondary | ICD-10-CM | POA: Diagnosis not present

## 2018-01-31 DIAGNOSIS — H539 Unspecified visual disturbance: Secondary | ICD-10-CM

## 2018-01-31 DIAGNOSIS — I10 Essential (primary) hypertension: Secondary | ICD-10-CM | POA: Diagnosis not present

## 2018-01-31 DIAGNOSIS — F32A Depression, unspecified: Secondary | ICD-10-CM

## 2018-01-31 DIAGNOSIS — Z794 Long term (current) use of insulin: Secondary | ICD-10-CM | POA: Diagnosis not present

## 2018-01-31 DIAGNOSIS — H409 Unspecified glaucoma: Secondary | ICD-10-CM | POA: Diagnosis present

## 2018-01-31 DIAGNOSIS — Z7901 Long term (current) use of anticoagulants: Secondary | ICD-10-CM | POA: Diagnosis not present

## 2018-01-31 DIAGNOSIS — R17 Unspecified jaundice: Secondary | ICD-10-CM

## 2018-01-31 DIAGNOSIS — Z8673 Personal history of transient ischemic attack (TIA), and cerebral infarction without residual deficits: Secondary | ICD-10-CM

## 2018-01-31 DIAGNOSIS — G4733 Obstructive sleep apnea (adult) (pediatric): Secondary | ICD-10-CM

## 2018-01-31 DIAGNOSIS — Z833 Family history of diabetes mellitus: Secondary | ICD-10-CM

## 2018-01-31 DIAGNOSIS — E785 Hyperlipidemia, unspecified: Secondary | ICD-10-CM | POA: Diagnosis present

## 2018-01-31 DIAGNOSIS — E1042 Type 1 diabetes mellitus with diabetic polyneuropathy: Secondary | ICD-10-CM | POA: Diagnosis not present

## 2018-01-31 DIAGNOSIS — D72829 Elevated white blood cell count, unspecified: Secondary | ICD-10-CM

## 2018-01-31 DIAGNOSIS — E876 Hypokalemia: Secondary | ICD-10-CM | POA: Diagnosis not present

## 2018-01-31 DIAGNOSIS — Z818 Family history of other mental and behavioral disorders: Secondary | ICD-10-CM | POA: Diagnosis not present

## 2018-01-31 DIAGNOSIS — H5461 Unqualified visual loss, right eye, normal vision left eye: Secondary | ICD-10-CM | POA: Diagnosis not present

## 2018-01-31 DIAGNOSIS — E1065 Type 1 diabetes mellitus with hyperglycemia: Principal | ICD-10-CM | POA: Diagnosis present

## 2018-01-31 DIAGNOSIS — H543 Unqualified visual loss, both eyes: Secondary | ICD-10-CM | POA: Diagnosis present

## 2018-01-31 DIAGNOSIS — Z86711 Personal history of pulmonary embolism: Secondary | ICD-10-CM

## 2018-01-31 DIAGNOSIS — E1036 Type 1 diabetes mellitus with diabetic cataract: Secondary | ICD-10-CM | POA: Diagnosis present

## 2018-01-31 DIAGNOSIS — Z79899 Other long term (current) drug therapy: Secondary | ICD-10-CM

## 2018-01-31 DIAGNOSIS — R739 Hyperglycemia, unspecified: Secondary | ICD-10-CM

## 2018-01-31 DIAGNOSIS — E109 Type 1 diabetes mellitus without complications: Secondary | ICD-10-CM | POA: Diagnosis present

## 2018-01-31 DIAGNOSIS — F4310 Post-traumatic stress disorder, unspecified: Secondary | ICD-10-CM | POA: Diagnosis not present

## 2018-01-31 DIAGNOSIS — Z6828 Body mass index (BMI) 28.0-28.9, adult: Secondary | ICD-10-CM

## 2018-01-31 DIAGNOSIS — E860 Dehydration: Secondary | ICD-10-CM | POA: Diagnosis present

## 2018-01-31 DIAGNOSIS — I8290 Acute embolism and thrombosis of unspecified vein: Secondary | ICD-10-CM

## 2018-01-31 DIAGNOSIS — H547 Unspecified visual loss: Secondary | ICD-10-CM

## 2018-01-31 DIAGNOSIS — H53139 Sudden visual loss, unspecified eye: Secondary | ICD-10-CM | POA: Diagnosis present

## 2018-01-31 DIAGNOSIS — Z8249 Family history of ischemic heart disease and other diseases of the circulatory system: Secondary | ICD-10-CM | POA: Diagnosis not present

## 2018-01-31 DIAGNOSIS — F411 Generalized anxiety disorder: Secondary | ICD-10-CM | POA: Diagnosis present

## 2018-01-31 LAB — I-STAT VENOUS BLOOD GAS, ED
Acid-Base Excess: 3 mmol/L — ABNORMAL HIGH (ref 0.0–2.0)
Bicarbonate: 29.9 mmol/L — ABNORMAL HIGH (ref 20.0–28.0)
O2 Saturation: 40 %
TCO2: 32 mmol/L (ref 22–32)
pCO2, Ven: 53.5 mmHg (ref 44.0–60.0)
pH, Ven: 7.356 (ref 7.250–7.430)
pO2, Ven: 24 mmHg — CL (ref 32.0–45.0)

## 2018-01-31 LAB — COMPREHENSIVE METABOLIC PANEL
ALT: 50 U/L — ABNORMAL HIGH (ref 0–44)
AST: 28 U/L (ref 15–41)
Albumin: 3.8 g/dL (ref 3.5–5.0)
Alkaline Phosphatase: 112 U/L (ref 38–126)
Anion gap: 13 (ref 5–15)
BUN: 13 mg/dL (ref 6–20)
CO2: 25 mmol/L (ref 22–32)
Calcium: 9.4 mg/dL (ref 8.9–10.3)
Chloride: 92 mmol/L — ABNORMAL LOW (ref 98–111)
Creatinine, Ser: 1.17 mg/dL — ABNORMAL HIGH (ref 0.44–1.00)
GFR calc Af Amer: >60 mL/min (ref >60)
GFR calc non Af Amer: 56 mL/min — ABNORMAL LOW (ref >60)
Glucose, Bld: 664 mg/dL (ref 70–99)
Potassium: 4.2 mmol/L (ref 3.5–5.1)
Sodium: 130 mmol/L — ABNORMAL LOW (ref 135–145)
Total Bilirubin: 1.5 mg/dL — ABNORMAL HIGH (ref 0.3–1.2)
Total Protein: 7.7 g/dL (ref 6.5–8.1)

## 2018-01-31 LAB — ETHANOL: Alcohol, Ethyl (B): <10 mg/dL (ref <10)

## 2018-01-31 LAB — URINALYSIS, MICROSCOPIC (REFLEX)
RBC / HPF: NONE SEEN RBC/hpf (ref 0–5)
WBC, UA: NONE SEEN WBC/hpf (ref 0–5)

## 2018-01-31 LAB — URINALYSIS, ROUTINE W REFLEX MICROSCOPIC
Bilirubin Urine: NEGATIVE
Glucose, UA: >=500 mg/dL — AB
Hgb urine dipstick: NEGATIVE
Ketones, ur: 15 mg/dL — AB
Leukocytes, UA: NEGATIVE
Nitrite: NEGATIVE
Protein, ur: NEGATIVE mg/dL
Specific Gravity, Urine: <1.005 — ABNORMAL LOW (ref 1.005–1.030)
pH: 5.5 (ref 5.0–8.0)

## 2018-01-31 LAB — CBC
HCT: 41.4 % (ref 36.0–46.0)
Hemoglobin: 13.4 g/dL (ref 12.0–15.0)
MCH: 30.7 pg (ref 26.0–34.0)
MCHC: 32.4 g/dL (ref 30.0–36.0)
MCV: 95 fL (ref 80.0–100.0)
Platelets: 318 10*3/uL (ref 150–400)
RBC: 4.36 MIL/uL (ref 3.87–5.11)
RDW: 12 % (ref 11.5–15.5)
WBC: 13.7 10*3/uL — ABNORMAL HIGH (ref 4.0–10.5)
nRBC: 0 % (ref 0.0–0.2)

## 2018-01-31 LAB — DIFFERENTIAL
Abs Immature Granulocytes: 0.03 10*3/uL (ref 0.00–0.07)
Basophils Absolute: 0.1 10*3/uL (ref 0.0–0.1)
Basophils Relative: 1 %
Eosinophils Absolute: 0.1 10*3/uL (ref 0.0–0.5)
Eosinophils Relative: 1 %
Immature Granulocytes: 0 %
Lymphocytes Relative: 15 %
Lymphs Abs: 2 10*3/uL (ref 0.7–4.0)
Monocytes Absolute: 0.8 10*3/uL (ref 0.1–1.0)
Monocytes Relative: 6 %
Neutro Abs: 10.7 10*3/uL — ABNORMAL HIGH (ref 1.7–7.7)
Neutrophils Relative %: 77 %

## 2018-01-31 LAB — RAPID URINE DRUG SCREEN, HOSP PERFORMED
Amphetamines: NOT DETECTED
Barbiturates: NOT DETECTED
Benzodiazepines: NOT DETECTED
Cocaine: NOT DETECTED
Opiates: NOT DETECTED
Tetrahydrocannabinol: NOT DETECTED

## 2018-01-31 LAB — PREGNANCY, URINE: Preg Test, Ur: NEGATIVE

## 2018-01-31 LAB — PROTIME-INR
INR: 1.53
Prothrombin Time: 18.2 seconds — ABNORMAL HIGH (ref 11.4–15.2)

## 2018-01-31 LAB — TROPONIN I: Troponin I: <0.03 ng/mL (ref <0.03)

## 2018-01-31 LAB — CBG MONITORING, ED
Glucose-Capillary: >600 mg/dL (ref 70–99)
Glucose-Capillary: 502 mg/dL (ref 70–99)
Glucose-Capillary: 374 mg/dL — ABNORMAL HIGH (ref 70–99)

## 2018-01-31 LAB — APTT: aPTT: 30 seconds (ref 24–36)

## 2018-01-31 MED ORDER — APRACLONIDINE HCL 1 % OP SOLN
1.0000 [drp] | Freq: Once | OPHTHALMIC | Status: DC
  Filled 2018-01-31: qty 0.1

## 2018-01-31 MED ORDER — INSULIN REGULAR HUMAN 100 UNIT/ML IJ SOLN
8.0000 [IU] | Freq: Once | INTRAMUSCULAR | Status: AC
  Administered 2018-01-31: 8 [IU] via INTRAVENOUS
  Filled 2018-01-31: qty 1

## 2018-01-31 MED ORDER — ACETAMINOPHEN 325 MG PO TABS
650.0000 mg | ORAL_TABLET | Freq: Once | ORAL | Status: AC
  Administered 2018-01-31: 650 mg via ORAL
  Filled 2018-01-31: qty 2

## 2018-01-31 MED ORDER — IOPAMIDOL (ISOVUE-370) INJECTION 76%
100.0000 mL | Freq: Once | INTRAVENOUS | Status: AC | PRN
  Administered 2018-01-31: 100 mL via INTRAVENOUS

## 2018-01-31 MED ORDER — TETRACAINE HCL 0.5 % OP SOLN
2.0000 [drp] | Freq: Once | OPHTHALMIC | Status: AC
  Administered 2018-01-31: 2 [drp] via OPHTHALMIC
  Filled 2018-01-31: qty 4

## 2018-01-31 MED ORDER — TIMOLOL MALEATE 0.5 % OP SOLN
1.0000 [drp] | Freq: Once | OPHTHALMIC | Status: AC
  Administered 2018-01-31: 1 [drp] via OPHTHALMIC
  Filled 2018-01-31: qty 5

## 2018-01-31 NOTE — ED Notes (Signed)
ED Provider at bedside. 

## 2018-01-31 NOTE — ED Triage Notes (Addendum)
Pt sts approx 10 min PTA she had a near syncopal episode and suddenly lost the vision in her left eye.  Has "blurry vision" at this time. Pt sts she was taken off of her coumadin for 2 days d/t increased INR and started it again last night.

## 2018-01-31 NOTE — ED Provider Notes (Signed)
[name] Provider Note   CSN: [id number] Arrival date & time: 01/31/18  1502     History   Chief Complaint Chief Complaint  Patient presents with  . Loss of Vision    HPI Maureen [name] is a 48 y.o. female.  HPI Patient presents with visual change.  States that around 245 today she stood up and became lightheaded.  States that her vision narrowed in both of her eyes.  Had a dull headache with it to but states she has had a headache for the last 3 days.  States that the vision in her left eye has decreased.  Both eyes initially decreased but left eye has not returned as much.  States she has had problems with vision in both her eyes recently.  States she has cataracts and has not been able to get surgery on them.  States now she has problem with the vision in the center part of her eye.  Patient with history of DVTs and TIAs.  Is on Coumadin but states that on Thursday with today being Monday her level was 6 so she has been off her couple days until yesterday.  No chest pain or trouble breathing. Patient is diabetic and thought her sugar could have been low so she drank her orange juice.  Sugar is now greater than 600.  States this morning it was 190. Past Medical History:  Diagnosis Date  . Abdominal pain   . Achilles tendon contracture, left    with Lisfranc fracture  . Anxiety   . Arthritis   . Biliary dyskinesia   . Cervical strain   . Closed head injury 2010  . Depression   . DVT (deep venous thrombosis) ([place])   . Elevated LFTs   . Fibromyalgia   . GERD (gastroesophageal reflux disease)   . History of kidney stones   . Hyperlipidemia   . Hypoglycemia   . Joint pain   . Major depressive disorder   . Migraine    hx of none recent  . Nausea & vomiting   . OSA on CPAP 05/14/2016   Moderate to severe OSA with an AHI of 28/hr now on CPAP at 19cm H2O  . Pneumonia   . Post traumatic stress disorder (PTSD)   . Pulmonary embolism ([place])     . Renal stones   . Skin rash    due to medications  . Sleep trouble   . Stroke [facility])    " series of mini strokes around 2007"  . TIA (transient ischemic attack)   . Wears glasses   . 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 Vision loss of left eye 01/31/2018  . OSA on CPAP 05/14/2016  . Obesity (BMI 30-39.9) 05/14/2016  . Acute deep vein thrombosis (DVT) of femoral vein of left lower extremity ([place]) 02/10/2016  . Diabetic polyneuropathy associated with type 2 diabetes mellitus ([place]) 02/10/2016  . Lisfranc dislocation, left, sequela 12/16/2015  . Short Achilles tendon (acquired), left ankle 12/16/2015  . Chest pain with moderate risk for cardiac etiology 11/29/2015  . Elevated troponin 11/19/2015  . Migraine headache 11/19/2015  . Neck pain 11/19/2015  . Acute kidney injury ([place]) 02/19/2015  . DKA (diabetic ketoacidoses) ([place]) 10/24/2014  . HLD (hyperlipidemia) 10/24/2014  . Abnormal LFTs 09/03/2014  . Dizziness 09/03/2014  . Acute pulmonary embolism ([place])  09/03/2014  . Transaminitis 09/02/2014  . GAD (generalized anxiety disorder) 11/23/2013  . PTSD (post-traumatic stress disorder) 11/23/2013  . Severe major depression without psychotic features ([place]) 10/10/2013  . Endometriosis 12/08/2011  . Arthritis of left knee 12/08/2011  . Diabetic retinopathy ([place]) 12/08/2011  . Fibromyalgia 10/23/2011  . DM type 1 (diabetes mellitus, type 1) ([place]) 10/13/2011    Past Surgical History:  Procedure Laterality Date  . ABDOMINAL HYSTERECTOMY  2001  . APPENDECTOMY    . [place]  . CHOLECYSTECTOMY  02/10/2011   Procedure: LAPAROSCOPIC CHOLECYSTECTOMY WITH INTRAOPERATIVE CHOLANGIOGRAM;  Surgeon: Judieth Keens, DO;  Location: [facility];  Service: General;  Laterality: N/A;  . COLONOSCOPY    . DILATION AND CURETTAGE OF UTERUS    . exploratory laparomty  Mar 04, 2012   . EYE SURGERY  2006   laser eye surgery left eye  . KNEE SURGERY Left 2012  . LAPAROSCOPIC APPENDECTOMY N/A 10/12/2012   Procedure: DIAGNOSTIC APPENDECTOMY LAPAROSCOPIC;  Surgeon: Madilyn Hook, DO;  Location: [facility];  Service: General;  Laterality: N/A;  . lipoma removed  yrs ago  . LIVER BIOPSY    . ORIF ANKLE FRACTURE Left 01/03/2016   Procedure: OPEN REDUCTION INTERNAL FIXATION (ORIF) LEFT LISFRANC JOINT, GASTROC RECESSION;  Surgeon: Newt Minion, MD;  Location: [facility];  Service: Orthopedics;  Laterality: Left;  . ROBOTIC ASSISTED LAPAROSCOPIC LYSIS OF ADHESION N/A 03/04/2012   Procedure: ROBOTIC ASSISTED LAPAROSCOPIC LYSIS OF EXTENSIVE ADHESIONS;  Surgeon: Claiborne Billings A. Pamala Hurry, MD;  Location: [facility];  Service: Gynecology;  Laterality: N/A;  . TONSILLECTOMY  2001     OB History   No obstetric history on file.      Home Medications    Prior to Admission medications   Medication Sig Start Date End Date Taking? Authorizing Provider  gabapentin (NEURONTIN) 300 MG capsule TAKE 2 CAPSULES BY MOUTH 3 TIMES A DAY 04/03/17  Yes [provider]  atorvastatin (LIPITOR) 40 MG tablet Take 40 mg by mouth at bedtime.     [provider]  insulin aspart (NOVOLOG) 100 UNIT/ML injection Inject 8 Units into the skin 3 (three) times daily with meals. 03/05/16   [place] Circle, FNP  insulin glargine (LANTUS) 100 UNIT/ML injection Inject 0.28 mLs (28 Units total) into the skin at bedtime. Patient taking differently: Inject 30 Units into the skin at bedtime.  03/05/16   [place] Circle, FNP  lidocaine (LIDODERM) 5 % Place 1 patch onto the skin daily as needed (pain). Remove after 12 hours. 10/30/15   [provider]  losartan (COZAAR) 25 MG tablet Take by mouth. 08/18/17   [provider]  venlafaxine XR (EFFEXOR-XR) 37.5 MG 24 hr capsule Take by mouth. 10/02/17   [provider]  warfarin (COUMADIN) 5 MG tablet TAKE 1 TABLET BY MOUTH DAILY AT 6PM. TAKE 1 & 1/2  TABLET ON [facility] 07/06/16   [provider]    Family History Family History  Problem Relation Age of Onset  . Cancer Father        lymphoma  . Nephrolithiasis Father   . Vascular Disease Father   . Alcohol abuse Father   . Drug abuse Father   . Cancer Maternal Grandmother        colon  . Hypertension Mother   . Heart disease Mother   . Cataracts Mother   . Depression Mother   . Diabetes Brother   . Drug abuse Brother   .  Mental illness Maternal Grandfather   . Cancer Maternal Grandfather   . Heart disease Paternal Grandmother   . Heart disease Paternal Grandfather   . Suicidality Maternal Uncle     Social History Social History   Tobacco Use  . Smoking status: Never Smoker  . Smokeless tobacco: Never Used  Substance Use Topics  . Alcohol use: Yes    Comment: occ  . Drug use: No     Allergies   Cephalexin; Ibuprofen; Meloxicam; and Reglan [metoclopramide]   Review of Systems Review of Systems  Constitutional: Negative for appetite change.  HENT: Negative for congestion.   Eyes: Positive for visual disturbance.  Respiratory: Negative for chest tightness and shortness of breath.   Cardiovascular: Negative for chest pain.  Gastrointestinal: Negative for abdominal pain.  Genitourinary: Negative for flank pain.  Musculoskeletal: Negative for back pain.  Skin: Negative for rash.  Neurological: Positive for light-headedness and headaches.  Hematological: Negative for adenopathy.  Psychiatric/Behavioral: Negative for confusion.     Physical Exam Updated Vital Signs BP 103/62   Pulse 81   Temp 97.8 F (36.6 C) (Oral)   Resp 14   Ht 5\' 4"  (1.626 m)   Wt 74.8 kg   SpO2 100%   BMI 28.32 kg/m   Physical Exam HENT:     Head: Atraumatic.     Nose: Nose normal.  Eyes:     Extraocular Movements: Extraocular movements intact.     Pupils: Pupils are equal, round, and reactive to light.     Comments: Visual fields grossly intact by  confrontation.  Difficult funduscopic exam due to cataracts.  May have some decreased vision centrally on left eye.  Neck:     Musculoskeletal: Neck supple.  Cardiovascular:     Rate and Rhythm: Normal rate and regular rhythm.  Pulmonary:     Effort: Pulmonary effort is normal.  Abdominal:     Tenderness: There is no abdominal tenderness.  Musculoskeletal:        General: No tenderness.  Neurological:     Mental Status: She is alert.     Motor: No weakness.     Coordination: Coordination normal.     Comments: Extraocular movements intact.  Visual fields intact by confrontation.  Good grip strength bilaterally.  Finger-nose intact bilaterally.  Good straight leg raise bilaterally.  Psychiatric:        Mood and Affect: Mood normal.      ED Treatments / Results  Labs (all labs ordered are listed, but only abnormal results are displayed) Labs Reviewed  PROTIME-INR - Abnormal; Notable for the following components:      Result Value   Prothrombin Time 18.2 (*)    All other components within normal limits  CBC - Abnormal; Notable for the following components:   WBC 13.7 (*)    All other components within normal limits  DIFFERENTIAL - Abnormal; Notable for the following components:   Neutro Abs 10.7 (*)    All other components within normal limits  COMPREHENSIVE METABOLIC PANEL - Abnormal; Notable for the following components:   Sodium 130 (*)    Chloride 92 (*)    Glucose, Bld 664 (*)    Creatinine, Ser 1.17 (*)    ALT 50 (*)    Total Bilirubin 1.5 (*)    GFR calc non Af Amer 56 (*)    All other components within normal limits  URINALYSIS, ROUTINE W REFLEX MICROSCOPIC - Abnormal; Notable for the following components:   Specific  Gravity, Urine <1.005 (*)    Glucose, UA >=500 (*)    Ketones, ur 15 (*)    All other components within normal limits  URINALYSIS, MICROSCOPIC (REFLEX) - Abnormal; Notable for the following components:   Bacteria, UA RARE (*)    All other  components within normal limits  CBG MONITORING, ED - Abnormal; Notable for the following components:   Glucose-Capillary >600 (*)    All other components within normal limits  I-STAT VENOUS BLOOD GAS, ED - Abnormal; Notable for the following components:   pO2, Ven 24.0 (*)    Bicarbonate 29.9 (*)    Acid-Base Excess 3.0 (*)    All other components within normal limits  CBG MONITORING, ED - Abnormal; Notable for the following components:   Glucose-Capillary 502 (*)    All other components within normal limits  CBG MONITORING, ED - Abnormal; Notable for the following components:   Glucose-Capillary 374 (*)    All other components within normal limits  ETHANOL  APTT  TROPONIN I  RAPID URINE DRUG SCREEN, HOSP PERFORMED  PREGNANCY, URINE    EKG EKG Interpretation  Date/Time:  Monday January 31 2018 15:39:26 EST Ventricular Rate:  101 PR Interval:    QRS Duration: 79 QT Interval:  357 QTC Calculation: 463 R Axis:   105 Text Interpretation:  Sinus tachycardia Right axis deviation Baseline wander in lead(s) V5 Confirmed by Davonna Belling [phone]) on 01/31/2018 4:26:56 PM   Radiology Ct Angio Head W Or Wo Contrast  Result Date: 01/31/2018 CLINICAL DATA:  Near syncopal episode. Sudden left eye vision loss. History of traumatic brain injury. EXAM: CT ANGIOGRAPHY HEAD AND NECK TECHNIQUE: Multidetector CT imaging of the head and neck was performed using the standard protocol during bolus administration of intravenous contrast. Multiplanar CT image reconstructions and MIPs were obtained to evaluate the vascular anatomy. Carotid stenosis measurements (when applicable) are obtained utilizing NASCET criteria, using the distal internal carotid diameter as the denominator. CONTRAST:  140mL ISOVUE-370 IOPAMIDOL (ISOVUE-370) INJECTION 76% COMPARISON:  Head CT 11/17/2014 FINDINGS: CT HEAD FINDINGS Brain: There is no mass, hemorrhage or extra-axial collection. The size and configuration of the  ventricles and extra-axial CSF spaces are normal. There is no acute or chronic infarction. The brain parenchyma is normal. Skull: The visualized skull base, calvarium and extracranial soft tissues are normal. Sinuses/Orbits: No fluid levels or advanced mucosal thickening of the visualized paranasal sinuses. No mastoid or middle ear effusion. The orbits are normal. CTA NECK FINDINGS SKELETON: There is no bony spinal canal stenosis. No lytic or blastic lesion. OTHER NECK: Normal pharynx, larynx and major salivary glands. No cervical lymphadenopathy. Unremarkable thyroid gland. UPPER CHEST: No pneumothorax or pleural effusion. No nodules or masses. AORTIC ARCH: There is no calcific atherosclerosis of the aortic arch. There is no aneurysm, dissection or hemodynamically significant stenosis of the visualized ascending aorta and aortic arch. Conventional 3 vessel aortic branching pattern. The visualized proximal subclavian arteries are widely patent. RIGHT CAROTID SYSTEM: --Common carotid artery: Widely patent origin without common carotid artery dissection or aneurysm. --Internal carotid artery: No dissection, occlusion or aneurysm. Mild atherosclerotic calcification at the carotid bifurcation without hemodynamically significant stenosis. --External carotid artery: No acute abnormality. LEFT CAROTID SYSTEM: --Common carotid artery: Widely patent origin without common carotid artery dissection or aneurysm. --Internal carotid artery: No dissection, occlusion or aneurysm. Mild atherosclerotic calcification at the carotid bifurcation without hemodynamically significant stenosis. --External carotid artery: No acute abnormality. VERTEBRAL ARTERIES: Codominant configuration. Both origins are normal. No dissection,  occlusion or flow-limiting stenosis to the vertebrobasilar confluence. CTA HEAD FINDINGS POSTERIOR CIRCULATION: --Basilar artery: Normal. --Posterior cerebral arteries: Normal. Both originate from the basilar artery.  --Superior cerebellar arteries: Normal. --Inferior cerebellar arteries: Normal anterior and posterior inferior cerebellar arteries. ANTERIOR CIRCULATION: --Intracranial internal carotid arteries: Normal. --Anterior cerebral arteries: Normal. Both A1 segments are present. Patent anterior communicating artery. --Middle cerebral arteries: Normal. --Posterior communicating arteries: Absent bilaterally. VENOUS SINUSES: As permitted by contrast timing, patent. ANATOMIC VARIANTS: None DELAYED PHASE: No parenchymal contrast enhancement. Review of the MIP images confirms the above findings. IMPRESSION: Normal CTA of the head and neck. Electronically Signed   By: Ulyses Jarred M.D.   On: 01/31/2018 17:42   Ct Angio Neck W And/or Wo Contrast  Result Date: 01/31/2018 CLINICAL DATA:  Near syncopal episode. Sudden left eye vision loss. History of traumatic brain injury. EXAM: CT ANGIOGRAPHY HEAD AND NECK TECHNIQUE: Multidetector CT imaging of the head and neck was performed using the standard protocol during bolus administration of intravenous contrast. Multiplanar CT image reconstructions and MIPs were obtained to evaluate the vascular anatomy. Carotid stenosis measurements (when applicable) are obtained utilizing NASCET criteria, using the distal internal carotid diameter as the denominator. CONTRAST:  139mL ISOVUE-370 IOPAMIDOL (ISOVUE-370) INJECTION 76% COMPARISON:  Head CT 11/17/2014 FINDINGS: CT HEAD FINDINGS Brain: There is no mass, hemorrhage or extra-axial collection. The size and configuration of the ventricles and extra-axial CSF spaces are normal. There is no acute or chronic infarction. The brain parenchyma is normal. Skull: The visualized skull base, calvarium and extracranial soft tissues are normal. Sinuses/Orbits: No fluid levels or advanced mucosal thickening of the visualized paranasal sinuses. No mastoid or middle ear effusion. The orbits are normal. CTA NECK FINDINGS SKELETON: There is no bony spinal  canal stenosis. No lytic or blastic lesion. OTHER NECK: Normal pharynx, larynx and major salivary glands. No cervical lymphadenopathy. Unremarkable thyroid gland. UPPER CHEST: No pneumothorax or pleural effusion. No nodules or masses. AORTIC ARCH: There is no calcific atherosclerosis of the aortic arch. There is no aneurysm, dissection or hemodynamically significant stenosis of the visualized ascending aorta and aortic arch. Conventional 3 vessel aortic branching pattern. The visualized proximal subclavian arteries are widely patent. RIGHT CAROTID SYSTEM: --Common carotid artery: Widely patent origin without common carotid artery dissection or aneurysm. --Internal carotid artery: No dissection, occlusion or aneurysm. Mild atherosclerotic calcification at the carotid bifurcation without hemodynamically significant stenosis. --External carotid artery: No acute abnormality. LEFT CAROTID SYSTEM: --Common carotid artery: Widely patent origin without common carotid artery dissection or aneurysm. --Internal carotid artery: No dissection, occlusion or aneurysm. Mild atherosclerotic calcification at the carotid bifurcation without hemodynamically significant stenosis. --External carotid artery: No acute abnormality. VERTEBRAL ARTERIES: Codominant configuration. Both origins are normal. No dissection, occlusion or flow-limiting stenosis to the vertebrobasilar confluence. CTA HEAD FINDINGS POSTERIOR CIRCULATION: --Basilar artery: Normal. --Posterior cerebral arteries: Normal. Both originate from the basilar artery. --Superior cerebellar arteries: Normal. --Inferior cerebellar arteries: Normal anterior and posterior inferior cerebellar arteries. ANTERIOR CIRCULATION: --Intracranial internal carotid arteries: Normal. --Anterior cerebral arteries: Normal. Both A1 segments are present. Patent anterior communicating artery. --Middle cerebral arteries: Normal. --Posterior communicating arteries: Absent bilaterally. VENOUS SINUSES:  As permitted by contrast timing, patent. ANATOMIC VARIANTS: None DELAYED PHASE: No parenchymal contrast enhancement. Review of the MIP images confirms the above findings. IMPRESSION: Normal CTA of the head and neck. Electronically Signed   By: Ulyses Jarred M.D.   On: 01/31/2018 17:42    Procedures Procedures (including critical care time)  Medications Ordered in  ED Medications  iopamidol (ISOVUE-370) 76 % injection 100 mL (100 mLs Intravenous Contrast Given 01/31/18 1730)  insulin regular (NOVOLIN R,HUMULIN R) 100 units/mL injection 8 Units (8 Units Intravenous Given 01/31/18 1820)  tetracaine (PONTOCAINE) 0.5 % ophthalmic solution 2 drop (2 drops Both Eyes Given 01/31/18 1923)  timolol (TIMOPTIC) 0.5 % ophthalmic solution 1 drop (1 drop Left Eye Given 01/31/18 2028)  acetaminophen (TYLENOL) tablet 650 mg (650 mg Oral Given 01/31/18 2141)     Initial Impression / Assessment and Plan / ED Course  I have reviewed the triage vital signs and the nursing notes.  Pertinent labs & imaging results that were available during my care of the patient were reviewed by me and considered in my medical decision making (see chart for details).     Patient had acute episode of near syncope followed by decreased vision in both eyes that went to remaining only in the left eye.  Does have history of TIAs/strokes.  On Coumadin but INR iNot therapeutic.  Also has hyperglycemia.  Not in DKA.  CT including CT angiography is negative.  Intraocular pressure is 25 on the right but 30 on the left.  Has had glaucoma previously and has had surgery for it.  Glaucoma considered with this but I think we cannot necessarily say that is the cause of the vision change.  Will admit to hospitalist.  Have called ophthalmology but have not heard back yet.  CRITICAL CARE Performed by: Davonna Belling Total critical care time: 30 minutes Critical care time was exclusive of separately billable procedures and treating other  patients. Critical care was necessary to treat or prevent imminent or life-threatening deterioration. Critical care was time spent personally by me on the following activities: development of treatment plan with patient and/or surrogate as well as nursing, discussions with consultants, evaluation of patient's response to treatment, examination of patient, obtaining history from patient or surrogate, ordering and performing treatments and interventions, ordering and review of laboratory studies, ordering and review of radiographic studies, pulse oximetry and re-evaluation of patient's condition.  Discussed with Dr. Katy Fitch from ophthalmology.  Thinks we do not need to be overly aggressive with the treatment of the high pressure.  Will see tomorrow and likely do a dilated exam.  States that she would need a TIA/stroke work-up.  Will see tomorrow morning.  If needed can see tonight however.   Final Clinical Impressions(s) / ED Diagnoses   Final diagnoses:  Near syncope  Hyperglycemia  Vision changes  Glaucoma of left eye, unspecified glaucoma type    ED Discharge Orders    None       Davonna Belling, MD 01/31/18 2155

## 2018-01-31 NOTE — ED Notes (Signed)
Patient transported to CT 

## 2018-02-01 ENCOUNTER — Observation Stay (HOSPITAL_BASED_OUTPATIENT_CLINIC_OR_DEPARTMENT_OTHER): Payer: Medicare Other

## 2018-02-01 ENCOUNTER — Observation Stay (HOSPITAL_COMMUNITY): Payer: Medicare Other

## 2018-02-01 DIAGNOSIS — H53131 Sudden visual loss, right eye: Secondary | ICD-10-CM

## 2018-02-01 DIAGNOSIS — F419 Anxiety disorder, unspecified: Secondary | ICD-10-CM

## 2018-02-01 DIAGNOSIS — I8290 Acute embolism and thrombosis of unspecified vein: Secondary | ICD-10-CM

## 2018-02-01 DIAGNOSIS — E1065 Type 1 diabetes mellitus with hyperglycemia: Secondary | ICD-10-CM | POA: Diagnosis present

## 2018-02-01 DIAGNOSIS — H53132 Sudden visual loss, left eye: Secondary | ICD-10-CM

## 2018-02-01 DIAGNOSIS — I6389 Other cerebral infarction: Secondary | ICD-10-CM

## 2018-02-01 DIAGNOSIS — H53139 Sudden visual loss, unspecified eye: Secondary | ICD-10-CM | POA: Diagnosis present

## 2018-02-01 DIAGNOSIS — R739 Hyperglycemia, unspecified: Secondary | ICD-10-CM

## 2018-02-01 DIAGNOSIS — D72829 Elevated white blood cell count, unspecified: Secondary | ICD-10-CM

## 2018-02-01 DIAGNOSIS — I951 Orthostatic hypotension: Secondary | ICD-10-CM | POA: Diagnosis present

## 2018-02-01 DIAGNOSIS — F329 Major depressive disorder, single episode, unspecified: Secondary | ICD-10-CM

## 2018-02-01 LAB — GLUCOSE, CAPILLARY
Glucose-Capillary: 341 mg/dL — ABNORMAL HIGH (ref 70–99)
Glucose-Capillary: 309 mg/dL — ABNORMAL HIGH (ref 70–99)
Glucose-Capillary: 116 mg/dL — ABNORMAL HIGH (ref 70–99)
Glucose-Capillary: 156 mg/dL — ABNORMAL HIGH (ref 70–99)
Glucose-Capillary: 135 mg/dL — ABNORMAL HIGH (ref 70–99)

## 2018-02-01 LAB — LIPID PANEL
Cholesterol: 146 mg/dL (ref 0–200)
HDL: 49 mg/dL (ref >40)
LDL Cholesterol: 80 mg/dL (ref 0–99)
Total CHOL/HDL Ratio: 3 RATIO
Triglycerides: 87 mg/dL (ref <150)
VLDL: 17 mg/dL (ref 0–40)

## 2018-02-01 LAB — CBC
HCT: 41.7 % (ref 36.0–46.0)
Hemoglobin: 14.2 g/dL (ref 12.0–15.0)
MCH: 31.8 pg (ref 26.0–34.0)
MCHC: 34.1 g/dL (ref 30.0–36.0)
MCV: 93.3 fL (ref 80.0–100.0)
Platelets: 275 10*3/uL (ref 150–400)
RBC: 4.47 MIL/uL (ref 3.87–5.11)
RDW: 11.9 % (ref 11.5–15.5)
WBC: 12.9 10*3/uL — ABNORMAL HIGH (ref 4.0–10.5)
nRBC: 0.2 % (ref 0.0–0.2)

## 2018-02-01 LAB — HEMOGLOBIN A1C
Hgb A1c MFr Bld: 10.4 % — ABNORMAL HIGH (ref 4.8–5.6)
Mean Plasma Glucose: 251.78 mg/dL

## 2018-02-01 LAB — BASIC METABOLIC PANEL
Anion gap: 13 (ref 5–15)
BUN: 9 mg/dL (ref 6–20)
CO2: 26 mmol/L (ref 22–32)
Calcium: 9.3 mg/dL (ref 8.9–10.3)
Chloride: 95 mmol/L — ABNORMAL LOW (ref 98–111)
Creatinine, Ser: 1.05 mg/dL — ABNORMAL HIGH (ref 0.44–1.00)
GFR calc Af Amer: >60 mL/min (ref >60)
GFR calc non Af Amer: >60 mL/min (ref >60)
Glucose, Bld: 353 mg/dL — ABNORMAL HIGH (ref 70–99)
Potassium: 3.7 mmol/L (ref 3.5–5.1)
Sodium: 134 mmol/L — ABNORMAL LOW (ref 135–145)

## 2018-02-01 LAB — PROTIME-INR
INR: 1.56
Prothrombin Time: 18.5 seconds — ABNORMAL HIGH (ref 11.4–15.2)

## 2018-02-01 LAB — ECHOCARDIOGRAM COMPLETE
Height: 64 in
Weight: 2640 oz

## 2018-02-01 LAB — CBG MONITORING, ED: Glucose-Capillary: 332 mg/dL — ABNORMAL HIGH (ref 70–99)

## 2018-02-01 LAB — HIV ANTIBODY (ROUTINE TESTING W REFLEX): HIV Screen 4th Generation wRfx: NONREACTIVE

## 2018-02-01 MED ORDER — ACETAMINOPHEN 650 MG RE SUPP: 650.0000 mg | RECTAL | Status: DC | PRN

## 2018-02-01 MED ORDER — INSULIN GLARGINE 100 UNIT/ML ~~LOC~~ SOLN
32.0000 [IU] | Freq: Every day | SUBCUTANEOUS | Status: DC
  Administered 2018-02-02 – 2018-02-03 (×2): 32 [IU] via SUBCUTANEOUS
  Filled 2018-02-01 (×2): qty 0.32

## 2018-02-01 MED ORDER — TRAMADOL HCL 50 MG PO TABS
50.0000 mg | ORAL_TABLET | Freq: Once | ORAL | Status: AC
  Administered 2018-02-01: 50 mg via ORAL
  Filled 2018-02-01: qty 1

## 2018-02-01 MED ORDER — SODIUM CHLORIDE 0.9 % IV SOLN
INTRAVENOUS | Status: AC
  Administered 2018-02-01: 18:00:00 via INTRAVENOUS

## 2018-02-01 MED ORDER — INSULIN GLARGINE 100 UNIT/ML ~~LOC~~ SOLN: 28.0000 [IU] | Freq: Every day | SUBCUTANEOUS | Status: DC

## 2018-02-01 MED ORDER — WARFARIN - PHARMACIST DOSING INPATIENT: Freq: Every day | Status: DC

## 2018-02-01 MED ORDER — STROKE: EARLY STAGES OF RECOVERY BOOK
Freq: Once | Status: AC
  Administered 2018-02-01: 13:00:00

## 2018-02-01 MED ORDER — ATORVASTATIN CALCIUM 40 MG PO TABS
40.0000 mg | ORAL_TABLET | Freq: Every day | ORAL | Status: DC
  Administered 2018-02-01 – 2018-02-02 (×2): 40 mg via ORAL
  Filled 2018-02-01 (×2): qty 1

## 2018-02-01 MED ORDER — INSULIN ASPART 100 UNIT/ML ~~LOC~~ SOLN
0.0000 [IU] | Freq: Three times a day (TID) | SUBCUTANEOUS | Status: DC
  Administered 2018-02-01: 2 [IU] via SUBCUTANEOUS
  Administered 2018-02-02: 1 [IU] via SUBCUTANEOUS
  Administered 2018-02-02: 3 [IU] via SUBCUTANEOUS
  Administered 2018-02-03 (×2): 2 [IU] via SUBCUTANEOUS

## 2018-02-01 MED ORDER — INSULIN ASPART 100 UNIT/ML ~~LOC~~ SOLN: 0.0000 [IU] | Freq: Every day | SUBCUTANEOUS | Status: DC

## 2018-02-01 MED ORDER — LORAZEPAM 2 MG/ML IJ SOLN
1.0000 mg | Freq: Once | INTRAMUSCULAR | Status: AC
  Administered 2018-02-01: 1 mg via INTRAVENOUS
  Filled 2018-02-01: qty 1

## 2018-02-01 MED ORDER — INSULIN ASPART 100 UNIT/ML ~~LOC~~ SOLN: 8.0000 [IU] | Freq: Three times a day (TID) | SUBCUTANEOUS | Status: DC

## 2018-02-01 MED ORDER — INSULIN GLARGINE 100 UNIT/ML ~~LOC~~ SOLN
30.0000 [IU] | Freq: Every day | SUBCUTANEOUS | Status: DC
  Filled 2018-02-01: qty 0.3

## 2018-02-01 MED ORDER — ACETAMINOPHEN 160 MG/5ML PO SOLN: 650.0000 mg | ORAL | Status: DC | PRN

## 2018-02-01 MED ORDER — INSULIN ASPART 100 UNIT/ML ~~LOC~~ SOLN
12.0000 [IU] | Freq: Three times a day (TID) | SUBCUTANEOUS | Status: DC
  Administered 2018-02-01: 10 [IU] via SUBCUTANEOUS

## 2018-02-01 MED ORDER — INSULIN ASPART 100 UNIT/ML ~~LOC~~ SOLN
0.0000 [IU] | Freq: Three times a day (TID) | SUBCUTANEOUS | Status: DC
  Administered 2018-02-01: 8 [IU] via SUBCUTANEOUS

## 2018-02-01 MED ORDER — ENOXAPARIN SODIUM 80 MG/0.8ML ~~LOC~~ SOLN
1.0000 mg/kg | Freq: Two times a day (BID) | SUBCUTANEOUS | Status: DC
  Administered 2018-02-01 – 2018-02-02 (×2): 75 mg via SUBCUTANEOUS
  Filled 2018-02-01 (×2): qty 0.8

## 2018-02-01 MED ORDER — ACETAMINOPHEN 325 MG PO TABS: 650.0000 mg | ORAL_TABLET | ORAL | Status: DC | PRN

## 2018-02-01 MED ORDER — INSULIN GLARGINE 100 UNIT/ML ~~LOC~~ SOLN: 40.0000 [IU] | Freq: Every day | SUBCUTANEOUS | Status: DC

## 2018-02-01 MED ORDER — INSULIN ASPART 100 UNIT/ML ~~LOC~~ SOLN
8.0000 [IU] | Freq: Three times a day (TID) | SUBCUTANEOUS | Status: DC
  Administered 2018-02-01 – 2018-02-03 (×6): 8 [IU] via SUBCUTANEOUS

## 2018-02-01 MED ORDER — ZOLPIDEM TARTRATE 5 MG PO TABS
5.0000 mg | ORAL_TABLET | Freq: Every evening | ORAL | Status: DC | PRN
  Administered 2018-02-01 – 2018-02-02 (×2): 5 mg via ORAL
  Filled 2018-02-01 (×2): qty 1

## 2018-02-01 MED ORDER — INSULIN GLARGINE 100 UNIT/ML ~~LOC~~ SOLN
28.0000 [IU] | Freq: Every day | SUBCUTANEOUS | Status: DC
  Administered 2018-02-01: 28 [IU] via SUBCUTANEOUS
  Filled 2018-02-01: qty 0.28

## 2018-02-01 MED ORDER — WARFARIN SODIUM 5 MG PO TABS
5.0000 mg | ORAL_TABLET | Freq: Once | ORAL | Status: AC
  Administered 2018-02-01: 5 mg via ORAL
  Filled 2018-02-01: qty 1

## 2018-02-01 MED FILL — Insulin Regular (Human) Inj 100 Unit/ML: INTRAMUSCULAR | Qty: 0.08 | Status: AC

## 2018-02-01 NOTE — Progress Notes (Signed)
  Echocardiogram 2D Echocardiogram has been performed.  Johny Chess 02/01/2018, 10:14 AM

## 2018-02-01 NOTE — Progress Notes (Addendum)
ANTICOAGULATION CONSULT NOTE - Initial Consult  Pharmacy Consult for Coumadin/lovenox Indication: h/o DVT and PE  Allergies  Allergen Reactions  . Cephalexin Anaphylaxis, Shortness Of Breath, Rash and Other (See Comments)    Pt was admitted to the hospital upon taking.   . Ibuprofen Nausea And Vomiting and Rash  . Meloxicam Nausea Only  . Reglan [Metoclopramide] Anxiety    Patient Measurements: Height: 5\' 4"  (162.6 cm) Weight: 165 lb (74.8 kg) IBW/kg (Calculated) : 54.7  Vital Signs: Temp: 98.4 F (36.9 C) (01/14 0813) Temp Source: Oral (01/14 0813) BP: 130/64 (01/14 0813) Pulse Rate: 94 (01/14 1253)  Labs: Recent Labs    01/31/18 1534 02/01/18 0426 02/01/18 0449  HGB 13.4  --  14.2  HCT 41.4  --  41.7  PLT 318  --  275  APTT 30  --   --   LABPROT 18.2*  --  18.5*  INR 1.53  --  1.56  CREATININE 1.17* 1.05*  --   TROPONINI <0.03  --   --     Estimated Creatinine Clearance: 67 mL/min (A) (by C-G formula based on SCr of 1.05 mg/dL (H)).   Medical History: Past Medical History:  Diagnosis Date  . Abdominal pain   . Achilles tendon contracture, left    with Lisfranc fracture  . Anxiety   . Arthritis   . Biliary dyskinesia   . Cervical strain   . Closed head injury 2010  . Depression   . DVT (deep venous thrombosis) ([place])   . Elevated LFTs   . Fibromyalgia   . GERD (gastroesophageal reflux disease)   . History of kidney stones   . Hyperlipidemia   . Hypoglycemia   . Joint pain   . Major depressive disorder   . Migraine    hx of none recent  . Nausea & vomiting   . OSA on CPAP 05/14/2016   Moderate to severe OSA with an AHI of 28/hr now on CPAP at 19cm H2O  . Pneumonia   . Post traumatic stress disorder (PTSD)   . Pulmonary embolism ([place])   . Renal stones   . Skin rash    due to medications  . Sleep trouble   . Stroke [facility])    " series of mini strokes around 2007"  . TIA (transient ischemic attack)   . Wears glasses   . Well controlled type  1 diabetes mellitus with peripheral neuropathy ([facility])    Type 1    Medications:  Awaiting home med rec  Assessment: 49 y.o. F presented to [facility] with visual changes. Pt on coumadin PTA for h/o DVT/PE. Pt stated that she was off coumadin from Thur-Sun due to elevated INR of 6. Admission INR subtherapeutic 1.53.  Case manager is looking into NOAC now. INR came back today at 1.56. Will ask MD if lovenox bridging is needed for now. Ok to bridge per Dr Clementeen Graham. CrCl ~67 ml/min. Hgb 14.2  Goal of Therapy:  INR 2-3 Monitor platelets by anticoagulation protocol: Yes   Plan: Coumadin 5mg  PO qday  Daily INR Lovenox 75mg  SQ q12  Onnie Boer, PharmD, [place], AAHIVP, CPP Infectious Disease Pharmacist 02/01/2018 4:15 PM

## 2018-02-01 NOTE — Progress Notes (Addendum)
Inpatient Diabetes Program Recommendations  AACE/ADA: New Consensus Statement on Inpatient Glycemic Control (2015)  Target Ranges:  Prepandial:   less than 140 mg/dL      Peak postprandial:   less than 180 mg/dL (1-2 hours)      Critically ill patients:  140 - 180 mg/dL   Lab Results  Component Value Date   GLUCAP 116 (H) 02/01/2018   HGBA1C 10.4 (H) 02/01/2018    Results for DALEY, MOORADIAN (MRN [id number]) as of 02/01/2018 17:06  Ref. Range 01/31/2018 15:18 01/31/2018 18:05 01/31/2018 19:28 02/01/2018 00:14 02/01/2018 05:35 02/01/2018 08:25 02/01/2018 12:59 02/01/2018 16:46  Glucose-Capillary Latest Ref Range: 70 - 99 mg/dL >600 (HH) 502 (HH) 374 (H) 332 (H) 341 (H) 309 (H) 116 (H) 156 (H)      Patient was very engaging and aware of her diabetes (diagnosed type 1 at age 15). She is NOT taking the Lantus and Novolog at home due to finances. She informed me that she has not been able to see a provider about her diabetes and has been purchasing 70/30 over the counter for about the past year. Asked patient about an office note in chart from Alphonzo Severance PA in  May 2019 regarding her diabetes. She states that is one of the last times she was able to see a provider due to finances.  When I inquired about how she doses herself she said she has been counting her carbs and dosing 70/30 accordingly BID usually the dose is between (15-25 units). Stated in the next week she will now be covered under her son's insurance as he is in enlisting in the [employer] and the additional insurance coverage starts immediately. At that time she plans to make an appt to see an endocrinologist and will be able to more likely afford lantus/novolog insulin. List of area endocrinologists given to patient. She states she used to have very good control of her diabetes (even had an insulin pump for a while) and looks forward to being able to take better control of her diabetes with upcoming additional insurance coverage.  She was  aware of her Hgb A1c of 10.4% (252 mg/dl) and verbalized her future plans to help improve her glycemic control.   She has had many instances of low blood sugar at home and is concerned that may happen here. Instructed patient ANYTIME she feels s/s of hypoglycemia to call the RN and asked for her CBG to be checked. Spoke to bedside nurse Lannette Donath and she is aware of our new hypoglycemic protocol if needed.   I told patient she knew her body's reaction to insulin the best and if she feels that a certain amount of insulin is too much for her to let her RN know (she did earlier today with breakfast and lunch - requested a smaller amount as she thought it would make her drop). Called Dr. Clementeen Graham and he decreased her Novolog tid correction to sensitive which will help. Daily Lantus dose also decreased to 32 units to start tomorrow am. (Again, she had been taking only 70/30 BID for the past year so having Lantus and Novolog meal coverage and correction scale is a different regimen than she had been on at home.)  Expressed concerns about diet and other restrictions with Coumadin. Dietician consult ordered by MD.   -- Will follow during hospitalization.--  Jonna Clark RN, MSN Diabetes Coordinator Inpatient Glycemic Control Team Team Pager: [phone] (8am-5pm)

## 2018-02-01 NOTE — Evaluation (Addendum)
Occupational Therapy Evaluation and Discharge Patient Details Name: Maureen [name] MRN: [id number] DOB: Mar 19, 1970 Today's Date: 02/01/2018    History of Present Illness Patient is a 48 y/o female who presents with sudden vision loss in left eye and near syncopal episode. Workup pending. PMH includes DM, PE, depression, PTSD, fibromyalgia, major depressive disorder, closed head injury, migraine.    Clinical Impression   PTA patient reports independent with ADLs/IADLs, minimal driving.  Admitted for above and limited by problem list below, including blurry vision, headache and decreased activity tolerance.  Pt reports history of glaucoma and cataracts prior to admission, but worsening L eye vision.  Today, patient able to distance and near without assist, track and scan without difficulty; reports R eye vision blurry from dilation, and continued L eye blurry vision.  Able to complete ADLs with modified independence, mild dizziness upon changing positions but faded with activity and no physical assist required for functional self care transfers or ADLs.  Reviewed visual compensatory, safety, postural changes, and fall prevention techniques, pt agreeable to all provided education.  Encouraged follow up with optothamolgist Based on performance today, no further OT needs have been identified.    Thank you for this referral. OT signing off.     Follow Up Recommendations  No OT follow up    Equipment Recommendations  None recommended by OT    Recommendations for Other Services       Precautions / Restrictions Precautions Precautions: Fall Precaution Comments: blurry left eye Restrictions Weight Bearing Restrictions: No      Mobility Bed Mobility Overal bed mobility: Modified Independent             General bed mobility comments: HOB elevated. NO assist needed.   Transfers Overall transfer level: Modified independent               General transfer comment: basic transfers  and simulated tub transfers without assist required; good safety awareness     Balance Overall balance assessment: Mild deficits observed, not formally tested                                         ADL either performed or assessed with clinical judgement   ADL Overall ADL's : Modified independent                                       General ADL Comments: patient at modified independent level for bathing ,dressing, grooming, and simulated toilet/tub transfers; reviewed safety and fall prevention strategies      Vision Baseline Vision/History: Wears glasses Wears Glasses: At all times Patient Visual Report: Other (comment);Blurring of vision(does not have glasses at hospital, hx glacoma/cataracts) Vision Assessment?: Yes Eye Alignment: Within Functional Limits Ocular Range of Motion: Within Functional Limits Alignment/Gaze Preference: Within Defined Limits Tracking/Visual Pursuits: Able to track stimulus in all quads without difficulty Saccades: Within functional limits Convergence: Within functional limits Visual Fields: No apparent deficits Additional Comments: vision appears WFL, able to read distance and near; blurry vision L eye per report but encouraged followup with opthamalogist      Perception     Praxis      Pertinent Vitals/Pain Pain Assessment: Faces Faces Pain Scale: Hurts little more Pain Location: headache Pain Descriptors / Indicators: Headache Pain Intervention(s): Monitored during session;Repositioned  Hand Dominance     Extremity/Trunk Assessment Upper Extremity Assessment Upper Extremity Assessment: Overall WFL for tasks assessed   Lower Extremity Assessment Lower Extremity Assessment: Defer to PT evaluation   Cervical / Trunk Assessment Cervical / Trunk Assessment: Normal   Communication Communication Communication: No difficulties   Cognition Arousal/Alertness: Awake/alert Behavior During Therapy: WFL  for tasks assessed/performed Overall Cognitive Status: Within Functional Limits for tasks assessed                                     General Comments  VSS, reports dizziness upon inital stand but fades with activity     Exercises     Shoulder Instructions      Home Living Family/patient expects to be discharged to:: Private residence Living Arrangements: Children Available Help at Discharge: Family;Available PRN/intermittently Type of Home: House Home Access: Stairs to enter [employer] of Steps: 3 flights to get to home Entrance Stairs-Rails: Right Home Layout: One level     Bathroom Shower/Tub: Teacher, early years/pre: Standard     Home Equipment: Crutches          Prior Functioning/Environment Level of Independence: Independent        Comments: Sons help with driving. Cooks, cleans minimally. Likes to play bball with sons        OT Problem List: Decreased activity tolerance;Impaired vision/perception;Pain      OT Treatment/Interventions:      OT Goals(Current goals can be found in the care plan section) Acute Rehab OT Goals Patient Stated Goal: to get better OT Goal Formulation: With patient  OT Frequency:     Barriers to D/C:            Co-evaluation              AM-PAC OT "6 Clicks" Daily Activity     Outcome Measure Help from another person eating meals?: None Help from another person taking care of personal grooming?: None Help from another person toileting, which includes using toliet, bedpan, or urinal?: None Help from another person bathing (including washing, rinsing, drying)?: None Help from another person to put on and taking off regular upper body clothing?: None Help from another person to put on and taking off regular lower body clothing?: None 6 Click Score: 24   End of Session Nurse Communication: Mobility status  Activity Tolerance: Patient tolerated treatment well Patient left: in  bed;with call bell/phone within reach;with family/visitor present  OT Visit Diagnosis: Low vision, both eyes (H54.2);Pain Pain - part of body: (headache)                Time: 1610-9604 OT Time Calculation (min): 22 min Charges:  OT General Charges $OT Visit: 1 Visit OT Evaluation $OT Eval Low Complexity: 1 Low  Delight Stare, OT Acute Rehabilitation Services Pager [phone] Office [phone]   Delight Stare 02/01/2018, 5:10 PM

## 2018-02-01 NOTE — Progress Notes (Signed)
Arrived from [place]. Alert and oriented. C/O of headache 7/10. Family at bedside.  Call light within reach.

## 2018-02-01 NOTE — ED Notes (Signed)
No vision loss since arrival. Pt states still blurred vision on left eye. Alert, ambulated to BR steady brisk gait. Speech clear.

## 2018-02-01 NOTE — ED Notes (Signed)
[facility] notified (Tammy) - patient ready for transport 

## 2018-02-01 NOTE — Evaluation (Signed)
Physical Therapy Evaluation Patient Details Name: Maureen [name] MRN: [id number] DOB: 07-15-1970 Today's Date: 02/01/2018   History of Present Illness  Patient is a 48 y/o female who presents with sudden vision loss in left eye and near syncopal episode. Workup pending. PMH includes DM, PE, depression, PTSD, fibromyalgia, major depressive disorder, closed head injury, migraine.   Clinical Impression  Patient presents with blurry vision left eye, dizziness, generalized weakness (RLE>LLE), impaired balance and impaired mobility s/p above. Pt independent PTA and lives with 2 grown sons. Pt is independent with ADLs and some IADLs. Evaluation limited today as pt's eyes had just been dilated by Ophthamologist and also needing to go down for echo. Able to read clock on wall but none of the boards in the room. Also of note, pt with orthostatic hypotension. Supine BP 130/64 Sitting BP 114/80 Sanding BP 104/70- symptomatic and dizzy with all changes in position, RN notified.  Tolerated within room ambulation with close min guard due to unsteadiness. Pt plans to move back in with mother in [place] soon since sons are both moving out. Will follow acutely to maximize independence and mobility prior to return home.     Follow Up Recommendations No PT follow up;Supervision - Intermittent    Equipment Recommendations  None recommended by PT    Recommendations for Other Services       Precautions / Restrictions Precautions Precautions: Fall Precaution Comments: blurry left eye Restrictions Weight Bearing Restrictions: No      Mobility  Bed Mobility Overal bed mobility: Modified Independent             General bed mobility comments: HOB elevated. NO assist needed.   Transfers Overall transfer level: Needs assistance Equipment used: None Transfers: Sit to/from Stand Sit to Stand: Min guard         General transfer comment: Close Min guard due to dizziness, unsteadiness.    Ambulation/Gait Ambulation/Gait assistance: Min guard Gait Distance (Feet): 25 Feet Assistive device: None Gait Pattern/deviations: Step-through pattern;Decreased stride length Gait velocity: decreased   General Gait Details: Slow, guarded gait with close min guard; no LOB but reports weakness throughout LEs. Dizziness. Limited due to needing to go down for test and eyes dilated from exam.   Stairs            Wheelchair Mobility    Modified Rankin (Stroke Patients Only) Modified Rankin (Stroke Patients Only) Pre-Morbid Rankin Score: Slight disability Modified Rankin: Moderately severe disability     Balance Overall balance assessment: Needs assistance Sitting-balance support: Feet supported;No upper extremity supported Sitting balance-Leahy Scale: Good     Standing balance support: During functional activity Standing balance-Leahy Scale: Fair Standing balance comment: Clos emin guard needed for safety.                              Pertinent Vitals/Pain Pain Assessment: 0-10 Pain Score: 7  Pain Location: headache Pain Descriptors / Indicators: Headache Pain Intervention(s): Monitored during session    Home Living Family/patient expects to be discharged to:: Private residence Living Arrangements: Children(children moving out) Available Help at Discharge: Family;Available PRN/intermittently Type of Home: House Home Access: Stairs to enter Entrance Stairs-Rails: Right Entrance Stairs-Number of Steps: 3 flights to get to home Home Layout: One level Home Equipment: Crutches      Prior Function Level of Independence: Independent         Comments: Sons help with driving. Cooks, cleans minimally. Likes to  play bball with sons     Hand Dominance        Extremity/Trunk Assessment   Upper Extremity Assessment Upper Extremity Assessment: Defer to OT evaluation    Lower Extremity Assessment Lower Extremity Assessment: Generalized  weakness;RLE deficits/detail;LLE deficits/detail(Reports subjective weakness RLE but not noticeable during MMT) RLE Sensation: history of peripheral neuropathy LLE Sensation: history of peripheral neuropathy       Communication   Communication: No difficulties  Cognition Arousal/Alertness: Awake/alert Behavior During Therapy: WFL for tasks assessed/performed Overall Cognitive Status: Within Functional Limits for tasks assessed                                        General Comments General comments (skin integrity, edema, etc.): Supine BP 130/64, sitting BP 114/80, standing BP 104/70    Exercises     Assessment/Plan    PT Assessment Patient needs continued PT services  PT Problem List Decreased strength;Decreased balance;Pain;Cardiopulmonary status limiting activity;Decreased mobility       PT Treatment Interventions Balance training;Patient/family education;Gait training;Therapeutic activities;Stair training;Therapeutic exercise;Neuromuscular re-education;Functional mobility training;DME instruction    PT Goals (Current goals can be found in the Care Plan section)  Acute Rehab PT Goals Patient Stated Goal: to get better PT Goal Formulation: With patient Time For Goal Achievement: 02/15/18 Potential to Achieve Goals: Good    Frequency Min 3X/week   Barriers to discharge Inaccessible home environment 3 flights of stairs to enter apt    Co-evaluation               AM-PAC PT "6 Clicks" Mobility  Outcome Measure Help needed turning from your back to your side while in a flat bed without using bedrails?: None Help needed moving from lying on your back to sitting on the side of a flat bed without using bedrails?: None Help needed moving to and from a bed to a chair (including a wheelchair)?: None Help needed standing up from a chair using your arms (e.g., wheelchair or bedside chair)?: None Help needed to walk in hospital room?: A Little Help needed  climbing 3-5 steps with a railing? : A Little 6 Click Score: 22    End of Session Equipment Utilized During Treatment: Gait belt Activity Tolerance: Patient tolerated treatment well Patient left: in bed;with call bell/phone within reach;with bed alarm set;Other (comment)(transport with pt) Nurse Communication: Mobility status;Other (comment)(orthostatic) PT Visit Diagnosis: Unsteadiness on feet (R26.81);Muscle weakness (generalized) (M62.81);Difficulty in walking, not elsewhere classified (R26.2);Pain Pain - part of body: (headache)    Time: 7124-5809 PT Time Calculation (min) (ACUTE ONLY): 24 min   Charges:   PT Evaluation $PT Eval Low Complexity: 1 Low PT Treatments $Gait Training: 8-22 mins        Wray Kearns, PT, DPT Acute Rehabilitation Services Pager [phone] Office [place] 02/01/2018, 10:15 AM

## 2018-02-01 NOTE — Progress Notes (Signed)
PROGRESS NOTE                                                                                                                                                                                                             Patient Demographics:    Maureen [name], is a 48 y.o. female, DOB - May 23, 1970, NUU:[id number]  Admit date - 01/31/2018   Admitting Physician Shela Leff, MD  Outpatient Primary MD for the patient is System, Pcp Not In  LOS - 0  Outpatient Specialists: None  Chief Complaint  Patient presents with  . 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 Vitals in the ED was stable.  CBG was in 600s without anion gap.  Intraocular pressure check in the ED was normal.  Patient placed on observation and ophthalmologist consulted. CT head, CT angiogram head and neck were normal.     Subjective:   Patient seen after returning from MRI.  Reports still having some blurry vision in the eye and that her eyes were dilated by the ophthalmologist during exam this morning.  Still complains of headache but no weakness.   Assessment  & Plan :    Principal Problem:   Acute loss of vision Possibly associated with hyperglycemia and orthostatic hypotension.  MRI of the brain negative for stroke.  Seen by ophthalmologist (Dr. Carolynn Sayers) this morning, no acute findings.  Vision starting to improve.  Outpatient follow-up with ophthalmology.    Active Problems:   DM type 1 (diabetes mellitus, type 1), uncontrolled with hyperglycemia  (HCC) A1c greater than 9.  On Lantus 28 units at bedtime and short-acting insulin 8 units 3 times a day with meals.  Reports her CBGs mostly in the 200s with low normal in the morning.  Reports being adherent to her insulin but being on Coumadin she has several food restrictions. CBGs in 600 with no anion gap.  This may be causing her to have impaired vision. CBC coming down to low  100s this afternoon.  Already received 28 units Lantus this morning.  I will continue her on pre-meal aspart 8 units 3 times daily.  Switch sliding scale coverage to sensitive given risk for hypoglycemia.  Will increase Lantus to 32 units starting a.m.  Order bedtime snack to prevent a.m. hypoglycemia. Diabetic coordinator consulted for medication adjustment, provide outpatient resources. Patient reports getting an insurance in near future and plans to establish care with an endocrinologist.  Orthostatic hypotension Noted to have blood pressure dropped from 130/64 supine to 104/70 standing, feeling dizzy as well.  Heart rate was up to 130 when I evaluate her while she was standing up. We will place her on IV normal saline at 125 cc/h.  Recheck orthostasis in a.m.  Mild transaminitis Ultrasound abdomen unremarkable.  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  Dosing per pharmacy.  Patient wishes to be on NOAC medication affordable.  Care management consulted.    HLD (hyperlipidemia) Continue Lipitor.  Has mild transaminitis which can be monitored closely.  Headache PRN Tylenol, give 1 dose of tramadol.  History of glaucoma and cataract Continue eyedrops.  Needs outpatient ophthalmology follow-up.    Code Status : Full code  Family Communication  : None at bedside  Disposition Plan  : Home possibly tomorrow if orthostasis resolved and blood sugar stable  Barriers For Discharge : Active symptoms  Consults  : Ophthalmology (Dr.  Katy Fitch)  Procedures  : CT head, CT angiogram head and neck, MRI brain, ultrasound abdomen  DVT Prophylaxis  : Coumadin  Lab Results  Component Value Date   PLT 275 02/01/2018    Antibiotics  :   Anti-infectives (From admission, onward)   None        Objective:   Vitals:   02/01/18 0349 02/01/18 0549 02/01/18 0813 02/01/18 1253  BP: (!) 125/55 105/63 130/64   Pulse: 76 74 84 94  Resp: 16 16 18    Temp: 98.1 F (36.7 C) 98.2 F (36.8 C) 98.4 F (36.9 C)   TempSrc: Oral Oral Oral   SpO2: 98% 99% 100% 100%  Weight:      Height:        Wt Readings from Last 3 Encounters:  01/31/18 74.8 kg  11/16/17 75.8 kg  10/18/17 74.8 kg     Intake/Output Summary (Last 24 hours) at 02/01/2018 1506 Last data filed at 02/01/2018 0819 Gross per 24 hour  Intake 0 ml  Output -  Net 0 ml     Physical Exam  Gen: not in distress HEENT: no pallor, moist mucosa, supple neck Chest: clear b/l, no added sounds CVS: S1 and S2 tachycardic, no murmurs GI: soft, NT, ND,  Musculoskeletal: warm, no edema CNS: Alert and oriented, nonfocal    Data Review:    CBC Recent Labs  Lab 01/31/18 1534 02/01/18 0449  WBC 13.7* 12.9*  HGB 13.4 14.2  HCT 41.4 41.7  PLT 318 275  MCV 95.0 93.3  MCH 30.7 31.8  MCHC 32.4 34.1  RDW 12.0 11.9  LYMPHSABS 2.0  --   MONOABS 0.8  --   EOSABS 0.1  --   BASOSABS 0.1  --     Chemistries  Recent Labs  Lab 01/31/18 1534 02/01/18 0426  NA 130* 134*  K 4.2 3.7  CL 92* 95*  CO2 25 26  GLUCOSE 664* 353*  BUN 13 9  CREATININE 1.17* 1.05*  CALCIUM 9.4 9.3  AST 28  --  ALT 50*  --   ALKPHOS 112  --   BILITOT 1.5*  --    ------------------------------------------------------------------------------------------------------------------ Recent Labs    02/01/18 0426  CHOL 146  HDL 49  LDLCALC 80  TRIG 87  CHOLHDL 3.0    Lab Results  Component Value Date   HGBA1C 10.4 (H) 02/01/2018    ------------------------------------------------------------------------------------------------------------------ No results for input(s): TSH, T4TOTAL, T3FREE, THYROIDAB in the last 72 hours.  Invalid input(s): FREET3 ------------------------------------------------------------------------------------------------------------------ No results for input(s): VITAMINB12, FOLATE, FERRITIN, TIBC, IRON, RETICCTPCT in the last 72 hours.  Coagulation profile Recent Labs  Lab 01/31/18 1534 02/01/18 0449  INR 1.53 1.56    No results for input(s): DDIMER in the last 72 hours.  Cardiac Enzymes Recent Labs  Lab 01/31/18 1534  TROPONINI <0.03   ------------------------------------------------------------------------------------------------------------------    Component Value Date/Time   BNP 12.0 11/17/2017 0040    Inpatient Medications  Scheduled Meds: . atorvastatin  40 mg Oral QHS  . insulin aspart  0-15 Units Subcutaneous TID WC  . insulin aspart  0-5 Units Subcutaneous QHS  . insulin aspart  8 Units Subcutaneous TID WC  . [START ON 02/02/2018] insulin glargine  40 Units Subcutaneous QHS  . traMADol  50 mg Oral Once  . Warfarin - Pharmacist Dosing Inpatient   Does not apply q1800   Continuous Infusions: . sodium chloride     PRN Meds:.acetaminophen **OR** acetaminophen (TYLENOL) oral liquid 160 mg/5 mL **OR** acetaminophen  Micro Results No results found for this or any previous visit (from the past 240 hour(s)).  Radiology Reports Ct Angio Head W Or Wo Contrast  Result Date: 01/31/2018 CLINICAL DATA:  Near syncopal episode. Sudden left eye vision loss. History of traumatic brain injury. EXAM: CT ANGIOGRAPHY HEAD AND NECK TECHNIQUE: Multidetector CT imaging of the head and neck was performed using the standard protocol during bolus administration of intravenous contrast. Multiplanar CT image reconstructions and MIPs were obtained to evaluate the vascular anatomy. Carotid  stenosis measurements (when applicable) are obtained utilizing NASCET criteria, using the distal internal carotid diameter as the denominator. CONTRAST:  186mL ISOVUE-370 IOPAMIDOL (ISOVUE-370) INJECTION 76% COMPARISON:  Head CT 11/17/2014 FINDINGS: CT HEAD FINDINGS Brain: There is no mass, hemorrhage or extra-axial collection. The size and configuration of the ventricles and extra-axial CSF spaces are normal. There is no acute or chronic infarction. The brain parenchyma is normal. Skull: The visualized skull base, calvarium and extracranial soft tissues are normal. Sinuses/Orbits: No fluid levels or advanced mucosal thickening of the visualized paranasal sinuses. No mastoid or middle ear effusion. The orbits are normal. CTA NECK FINDINGS SKELETON: There is no bony spinal canal stenosis. No lytic or blastic lesion. OTHER NECK: Normal pharynx, larynx and major salivary glands. No cervical lymphadenopathy. Unremarkable thyroid gland. UPPER CHEST: No pneumothorax or pleural effusion. No nodules or masses. AORTIC ARCH: There is no calcific atherosclerosis of the aortic arch. There is no aneurysm, dissection or hemodynamically significant stenosis of the visualized ascending aorta and aortic arch. Conventional 3 vessel aortic branching pattern. The visualized proximal subclavian arteries are widely patent. RIGHT CAROTID SYSTEM: --Common carotid artery: Widely patent origin without common carotid artery dissection or aneurysm. --Internal carotid artery: No dissection, occlusion or aneurysm. Mild atherosclerotic calcification at the carotid bifurcation without hemodynamically significant stenosis. --External carotid artery: No acute abnormality. LEFT CAROTID SYSTEM: --Common carotid artery: Widely patent origin without common carotid artery dissection or aneurysm. --Internal carotid artery: No dissection, occlusion or aneurysm. Mild atherosclerotic calcification at the carotid bifurcation without hemodynamically  significant stenosis. --External carotid artery: No acute abnormality. VERTEBRAL ARTERIES: Codominant configuration. Both origins are normal. No dissection, occlusion or flow-limiting stenosis to the vertebrobasilar confluence. CTA HEAD FINDINGS POSTERIOR CIRCULATION: --Basilar artery: Normal. --Posterior cerebral arteries: Normal. Both originate from the basilar artery. --Superior cerebellar arteries: Normal. --Inferior cerebellar arteries: Normal anterior and posterior inferior cerebellar arteries. ANTERIOR CIRCULATION: --Intracranial internal carotid arteries: Normal. --Anterior cerebral arteries: Normal. Both A1 segments are present. Patent anterior communicating artery. --Middle cerebral arteries: Normal. --Posterior communicating arteries: Absent bilaterally. VENOUS SINUSES: As permitted by contrast timing, patent. ANATOMIC VARIANTS: None DELAYED PHASE: No parenchymal contrast enhancement. Review of the MIP images confirms the above findings. IMPRESSION: Normal CTA of the head and neck. Electronically Signed   By: Ulyses Jarred M.D.   On: 01/31/2018 17:42   Ct Angio Neck W And/or Wo Contrast  Result Date: 01/31/2018 CLINICAL DATA:  Near syncopal episode. Sudden left eye vision loss. History of traumatic brain injury. EXAM: CT ANGIOGRAPHY HEAD AND NECK TECHNIQUE: Multidetector CT imaging of the head and neck was performed using the standard protocol during bolus administration of intravenous contrast. Multiplanar CT image reconstructions and MIPs were obtained to evaluate the vascular anatomy. Carotid stenosis measurements (when applicable) are obtained utilizing NASCET criteria, using the distal internal carotid diameter as the denominator. CONTRAST:  198mL ISOVUE-370 IOPAMIDOL (ISOVUE-370) INJECTION 76% COMPARISON:  Head CT 11/17/2014 FINDINGS: CT HEAD FINDINGS Brain: There is no mass, hemorrhage or extra-axial collection. The size and configuration of the ventricles and extra-axial CSF spaces are normal.  There is no acute or chronic infarction. The brain parenchyma is normal. Skull: The visualized skull base, calvarium and extracranial soft tissues are normal. Sinuses/Orbits: No fluid levels or advanced mucosal thickening of the visualized paranasal sinuses. No mastoid or middle ear effusion. The orbits are normal. CTA NECK FINDINGS SKELETON: There is no bony spinal canal stenosis. No lytic or blastic lesion. OTHER NECK: Normal pharynx, larynx and major salivary glands. No cervical lymphadenopathy. Unremarkable thyroid gland. UPPER CHEST: No pneumothorax or pleural effusion. No nodules or masses. AORTIC ARCH: There is no calcific atherosclerosis of the aortic arch. There is no aneurysm, dissection or hemodynamically significant stenosis of the visualized ascending aorta and aortic arch. Conventional 3 vessel aortic branching pattern. The visualized proximal subclavian arteries are widely patent. RIGHT CAROTID SYSTEM: --Common carotid artery: Widely patent origin without common carotid artery dissection or aneurysm. --Internal carotid artery: No dissection, occlusion or aneurysm. Mild atherosclerotic calcification at the carotid bifurcation without hemodynamically significant stenosis. --External carotid artery: No acute abnormality. LEFT CAROTID SYSTEM: --Common carotid artery: Widely patent origin without common carotid artery dissection or aneurysm. --Internal carotid artery: No dissection, occlusion or aneurysm. Mild atherosclerotic calcification at the carotid bifurcation without hemodynamically significant stenosis. --External carotid artery: No acute abnormality. VERTEBRAL ARTERIES: Codominant configuration. Both origins are normal. No dissection, occlusion or flow-limiting stenosis to the vertebrobasilar confluence. CTA HEAD FINDINGS POSTERIOR CIRCULATION: --Basilar artery: Normal. --Posterior cerebral arteries: Normal. Both originate from the basilar artery. --Superior cerebellar arteries: Normal. --Inferior  cerebellar arteries: Normal anterior and posterior inferior cerebellar arteries. ANTERIOR CIRCULATION: --Intracranial internal carotid arteries: Normal. --Anterior cerebral arteries: Normal. Both A1 segments are present. Patent anterior communicating artery. --Middle cerebral arteries: Normal. --Posterior communicating arteries: Absent bilaterally. VENOUS SINUSES: As permitted by contrast timing, patent. ANATOMIC VARIANTS: None DELAYED PHASE: No parenchymal contrast enhancement. Review of the MIP images confirms the above findings. IMPRESSION: Normal CTA of the head and neck. Electronically Signed   By: Cletus Gash.D.  On: 01/31/2018 17:42   Mr Brain Wo Contrast  Result Date: 02/01/2018 CLINICAL DATA:  Sudden onset vision loss in the left eye EXAM: MRI HEAD WITHOUT CONTRAST TECHNIQUE: Multiplanar, multiecho pulse sequences of the brain and surrounding structures were obtained without intravenous contrast. COMPARISON:  Head CT from yesterday.  Brain MRI 11/19/2015 FINDINGS: Brain: No acute infarction, hemorrhage, hydrocephalus, extra-axial collection or mass lesion. No evidence of demyelination. Clear suprasellar cistern. Vascular: Major flow voids are preserved Skull and upper cervical spine: Negative for marrow lesion Sinuses/Orbits: Negative. No gross orbital inflammation, mass, or intra-ocular collection. IMPRESSION: Negative brain MRI. Electronically Signed   By: Monte Fantasia M.D.   On: 02/01/2018 11:54   US Abdomen Limited Ruq  Result Date: 02/01/2018 CLINICAL DATA:  Elevated total bilirubin.  Cholecystectomy. EXAM: ULTRASOUND ABDOMEN LIMITED RIGHT UPPER QUADRANT COMPARISON:  CT scan of the abdomen and pelvis dated 01/10/2017 FINDINGS: Gallbladder: Removed. Common bile duct: Diameter: 4.8 mm, normal. Liver: No focal lesion identified. Within normal limits in parenchymal echogenicity. Portal vein is patent on color Doppler imaging with normal direction of blood flow towards the liver. IMPRESSION:  Normal exam. The gallbladder has been removed. No dilated bile ducts. Electronically Signed   By: Lorriane Shire M.D.   On: 02/01/2018 12:16    Time Spent in minutes  25   Lajoy Vanamburg M.D on 02/01/2018 at 3:06 PM  Between 7am to 7pm - Pager - [phone]  After 7pm go to [email or web address] - password [facility]  [facility] -  Office  [phone]

## 2018-02-01 NOTE — Consult Note (Signed)
Ophthalmology Initial Consult Note  Maureen, [name], 48 y.o. female Date of Service:  02/01/2018  Requesting physician: Louellen Molder, MD  Information Obtained from: patient, chart Chief Complaint:  Blurry vision  HPI/Discussion:  Maureen [name] is a 48 y.o. female who presented initially to ER with a sudden-onset vision change accompanied by lightheadedness. She described blacking out in both eyes at that time. VA returned to normal after awhile in the right eye, but was still very blurry in the left eye. Symptoms were accompanied by a headache. Pertinent history includes longstanding DM type 1, DVTs, and TIAs on warfarin. Glucose in the ER was 600. She was transferred to [facility] for stroke workup. This AM, her VA has returned to normal. She still complains of headaches. She states that her vision will darken, and she will see floaters when her sugars are out of control. She denies floaters or any other VA changes this AM.     Past Ocular Hx:  Narrow angles s/p laser, cataracts, diabetes Ocular Meds:  None Family ocular history: Noncontributory  Past Medical History:  Diagnosis Date  . Abdominal pain   . Achilles tendon contracture, left    with Lisfranc fracture  . Anxiety   . Arthritis   . Biliary dyskinesia   . Cervical strain   . Closed head injury 2010  . Depression   . DVT (deep venous thrombosis) ([place])   . Elevated LFTs   . Fibromyalgia   . GERD (gastroesophageal reflux disease)   . History of kidney stones   . Hyperlipidemia   . Hypoglycemia   . Joint pain   . Major depressive disorder   . Migraine    hx of none recent  . Nausea & vomiting   . OSA on CPAP 05/14/2016   Moderate to severe OSA with an AHI of 28/hr now on CPAP at 19cm H2O  . Pneumonia   . Post traumatic stress disorder (PTSD)   . Pulmonary embolism ([place])   . Renal stones   . Skin rash    due to medications  . Sleep trouble   . Stroke [facility])    " series of mini strokes around 2007"  . TIA  (transient ischemic attack)   . Wears glasses   . Well controlled type 1 diabetes mellitus with peripheral neuropathy ([place])    Type 1   Past Surgical History:  Procedure Laterality Date  . ABDOMINAL HYSTERECTOMY  2001  . APPENDECTOMY    . Rodriguez Hevia  . CHOLECYSTECTOMY  02/10/2011   Procedure: LAPAROSCOPIC CHOLECYSTECTOMY WITH INTRAOPERATIVE CHOLANGIOGRAM;  Surgeon: Judieth Keens, DO;  Location: [place];  Service: General;  Laterality: N/A;  . COLONOSCOPY    . DILATION AND CURETTAGE OF UTERUS    . exploratory laparomty  Mar 04, 2012  . EYE SURGERY  2006   laser eye surgery left eye  . KNEE SURGERY Left 2012  . LAPAROSCOPIC APPENDECTOMY N/A 10/12/2012   Procedure: DIAGNOSTIC APPENDECTOMY LAPAROSCOPIC;  Surgeon: Madilyn Hook, DO;  Location: [facility];  Service: General;  Laterality: N/A;  . lipoma removed  yrs ago  . LIVER BIOPSY    . ORIF ANKLE FRACTURE Left 01/03/2016   Procedure: OPEN REDUCTION INTERNAL FIXATION (ORIF) LEFT LISFRANC JOINT, GASTROC RECESSION;  Surgeon: Newt Minion, MD;  Location: [place];  Service: Orthopedics;  Laterality: Left;  . ROBOTIC ASSISTED LAPAROSCOPIC LYSIS OF ADHESION N/A 03/04/2012   Procedure: ROBOTIC ASSISTED LAPAROSCOPIC LYSIS OF EXTENSIVE ADHESIONS;  Surgeon:  Kelly A. Pamala Hurry, MD;  Location: [facility];  Service: Gynecology;  Laterality: N/A;  . TONSILLECTOMY  2001    Prior to Admission Meds: Medications Prior to Admission  Medication Sig Dispense Refill Last Dose  . gabapentin (NEURONTIN) 300 MG capsule TAKE 2 CAPSULES BY MOUTH 3 TIMES A DAY     . atorvastatin (LIPITOR) 40 MG tablet Take 40 mg by mouth at bedtime.    Taking  . insulin aspart (NOVOLOG) 100 UNIT/ML injection Inject 8 Units into the skin 3 (three) times daily with meals. 10 mL 0 Taking  . insulin glargine (LANTUS) 100 UNIT/ML injection Inject 0.28 mLs (28 Units total) into the skin at bedtime. (Patient taking differently: Inject 30 Units into the skin at bedtime. ) 10 mL 0  Taking  . lidocaine (LIDODERM) 5 % Place 1 patch onto the skin daily as needed (pain). Remove after 12 hours.  0 Taking  . losartan (COZAAR) 25 MG tablet Take by mouth.   Taking  . venlafaxine XR (EFFEXOR-XR) 37.5 MG 24 hr capsule Take by mouth.   Taking  . warfarin (COUMADIN) 5 MG tablet TAKE 1 TABLET BY MOUTH DAILY AT 6PM. TAKE 1 & 1/2 TABLET ON [facility]   Taking    Inpatient Meds: See chart  Allergies  Allergen Reactions  . Cephalexin Anaphylaxis, Shortness Of Breath, Rash and Other (See Comments)    Pt was admitted to the hospital upon taking.   . Ibuprofen Nausea And Vomiting and Rash  . Meloxicam Nausea Only  . Reglan [Metoclopramide] Anxiety   Social History   Tobacco Use  . Smoking status: Never Smoker  . Smokeless tobacco: Never Used  Substance Use Topics  . Alcohol use: Yes    Comment: occ   Family History  Problem Relation Age of Onset  . Cancer Father        lymphoma  . Nephrolithiasis Father   . Vascular Disease Father   . Alcohol abuse Father   . Drug abuse Father   . Cancer Maternal Grandmother        colon  . Hypertension Mother   . Heart disease Mother   . Cataracts Mother   . Depression Mother   . Diabetes Brother   . Drug abuse Brother   . Mental illness Maternal Grandfather   . Cancer Maternal Grandfather   . Heart disease Paternal Grandmother   . Heart disease Paternal Grandfather   . Suicidality Maternal Uncle     ROS: Other than ROS in the HPI, all other systems were negative.  Exam: Temp: 98.2 F (36.8 C) Pulse Rate: 74 BP: 105/63 Resp: 16 SpO2: 99 %  Visual Acuity:  near   OD 20/20   OS 20/20     OD OS  Confr Vis Fields Full to fingers Full to fingers  EOM (Primary) Full, no diplopia Full, no diplopia  Lids/Lashes Normal Normal  Conjunctiva White, quiet White, quiet  Adnexa  Normal, no proptosis Normal, no proptosis  Pupils  3.5 --> 2, somewhat brisk, no rAPD 3.5 --> 2, somewhat brisk, no rAPD  Cornea  Clear Clear   Anterior Chamber Formed, grossly quiet Formed, grossly quiet  Lens:  Nuclear and cortical changes Nuclear and cortical changes  IOP (tonopen) 21 16  Fundus - Dilated? YES   Optic Disc 0.75, thin pink rim, no edema 0.7, thin pink rim, no edema  Post Seg:  Retina  Vessels Normal caliber, no occlusions Normal caliber, no occlusions                  Vitreous  Clear Clear                  Macula No diabetic changes No diabetic changes                  Periphery Normal, no holes or tears Normal, no holes or tears       Neuro:  Oriented to person, place, and time:  Yes Psychiatric:  Mood and Affect Appropriate:  Yes  Labs/imaging:   A/P:  48 y.o. female with:  1) Anatomically narrow angles - Based on evidenceof prior treatment (LPI - laser peripheral iridotomy). - Difficult to assess at bedside.  2) Glaucoma, presumed - Based on history and larger than average c/d OU - Recommend outpatient follow up.  3) Cataracts - Cortical and nuclear - Significance unclear. Would need formal evaluation in clinic.  4) DM type 1, longstanding - No diabetic retinopathy - Glucose control advised  No evidence of embolic stroke. Could be due to transient episode of low or high BP, or perhaps severe hyperglycemia. No additional recommendations based on her eye exam. I do recommend that patient follow up with Dr. Nancy Fetter upon discharge.  R Wyatt Portela, MD  R Wyatt Portela, MD 02/01/2018, 8:02 AM

## 2018-02-01 NOTE — Progress Notes (Signed)
Pt requested for something to help her sleep. NP on-call notified and new order received. Will continue to closely monitor. Delia Heady RN

## 2018-02-01 NOTE — Progress Notes (Signed)
ANTICOAGULATION CONSULT NOTE - Initial Consult  Pharmacy Consult for Coumadin Indication: h/o DVT and PTE  Allergies  Allergen Reactions  . Cephalexin Anaphylaxis, Shortness Of Breath, Rash and Other (See Comments)    Pt was admitted to the hospital upon taking.   . Ibuprofen Nausea And Vomiting and Rash  . Meloxicam Nausea Only  . Reglan [Metoclopramide] Anxiety    Patient Measurements: Height: 5\' 4"  (162.6 cm) Weight: 165 lb (74.8 kg) IBW/kg (Calculated) : 54.7  Vital Signs: Temp: 98 F (36.7 C) (01/14 0149) Temp Source: Oral (01/14 0149) BP: 122/69 (01/14 0149) Pulse Rate: 67 (01/14 0149)  Labs: Recent Labs    01/31/18 1534  HGB 13.4  HCT 41.4  PLT 318  APTT 30  LABPROT 18.2*  INR 1.53  CREATININE 1.17*  TROPONINI <0.03    Estimated Creatinine Clearance: 60.1 mL/min (A) (by C-G formula based on SCr of 1.17 mg/dL (H)).   Medical History: Past Medical History:  Diagnosis Date  . Abdominal pain   . Achilles tendon contracture, left    with Lisfranc fracture  . Anxiety   . Arthritis   . Biliary dyskinesia   . Cervical strain   . Closed head injury 2010  . Depression   . DVT (deep venous thrombosis) ([place])   . Elevated LFTs   . Fibromyalgia   . GERD (gastroesophageal reflux disease)   . History of kidney stones   . Hyperlipidemia   . Hypoglycemia   . Joint pain   . Major depressive disorder   . Migraine    hx of none recent  . Nausea & vomiting   . OSA on CPAP 05/14/2016   Moderate to severe OSA with an AHI of 28/hr now on CPAP at 19cm H2O  . Pneumonia   . Post traumatic stress disorder (PTSD)   . Pulmonary embolism ([place])   . Renal stones   . Skin rash    due to medications  . Sleep trouble   . Stroke [facility])    " series of mini strokes around 2007"  . TIA (transient ischemic attack)   . Wears glasses   . 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 F presented to [facility] with visual changes. Pt on coumadin PTA for h/o DVT/PE. Pt stated that she was off coumadin from Thur-Sun due to elevated INR of 6. Admission INR subtherapeutic 1.53.  **Will verify home dose in a.m. as pt currently sleeping**  Goal of Therapy:  INR 2-3 Monitor platelets by anticoagulation protocol: Yes   Plan:  Daily INR F/u home coumadin dose Consider heparin bridge if continues to be subtherapeutic  Sherlon Handing, PharmD, BCPS Clinical pharmacist  **Pharmacist phone directory can now be found on [email or web address] ([email or web address]).  Listed under [place]. 02/01/2018,4:13 AM

## 2018-02-01 NOTE — H&P (Signed)
History and Physical    Maureen [name] QQP:[id number] DOB: Jul 17, 1970 DOA: 01/31/2018  PCP: System, Pcp Not In Patient coming from: [place] [facility]  Chief Complaint: Acute onset vision loss  HPI: Maureen [name] is a 48 y.o. female with medical history significant of TIA, cataracts, glaucoma, DVT on anticoagulation, type 1 diabetes, hyperlipidemia, depression, anxiety, OSA on CPAP presenting as a transfer from [place] ED for evaluation of acute onset vision loss.  Patient states that yesterday 1/13 around 3 PM she was walking and suddenly felt dizzy and her vision started turning dark in both eyes.  This lasted a few seconds and then she regained vision in her right eye but not the left eye.  Denies having any chest pain, shortness of breath, or loss of consciousness.  States now the vision in her left eye has returned but it is blurry.  She was having a headache at the time when she experienced sudden onset vision loss.  The headache is left-sided frontal/periorbital.  States she has previously had 2 surgeries for glaucoma last year in July.  She also has cataracts in both of her eyes.  States her ophthalmologist is Dr. Nancy Fetter.  Patient states she has type 1 diabetes and when her vision loss and dizziness happened she thought it was due to low blood sugar.  As such, she ate and drank juice.  She believes that could be contributing to her high blood sugar checked in the ED.  States she was diagnosed with type I high diabetes at the age of 46.  She uses scheduled NovoLog 8 units 3 times a day along along with sliding scale insulin with meals.  In addition uses Lantus 28 units at bedtime.  Reports compliance with insulin and did not run out of any for her diabetes medications. States she is on lifelong anticoagulation with Coumadin due to history of PE in 2016 and DVT in 2017.  Per patient, her INR was 6.3 last week on Thursday and she was advised to hold Coumadin for 2 days.  She  then started taking the medication yesterday.  Review of Systems: As per HPI otherwise 10 point review of systems negative.  Past Medical History:  Diagnosis Date  . Abdominal pain   . Achilles tendon contracture, left    with Lisfranc fracture  . Anxiety   . Arthritis   . Biliary dyskinesia   . Cervical strain   . Closed head injury 2010  . Depression   . DVT (deep venous thrombosis) ([place])   . Elevated LFTs   . Fibromyalgia   . GERD (gastroesophageal reflux disease)   . History of kidney stones   . Hyperlipidemia   . Hypoglycemia   . Joint pain   . Major depressive disorder   . Migraine    hx of none recent  . Nausea & vomiting   . OSA on CPAP 05/14/2016   Moderate to severe OSA with an AHI of 28/hr now on CPAP at 19cm H2O  . Pneumonia   . Post traumatic stress disorder (PTSD)   . Pulmonary embolism ([place])   . Renal stones   . Skin rash    due to medications  . Sleep trouble   . Stroke [facility])    " series of mini strokes around 2007"  . TIA (transient ischemic attack)   . Wears glasses   . 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 ABDOMINAL HYSTERECTOMY  2001  . APPENDECTOMY    . [place]  . CHOLECYSTECTOMY  02/10/2011   Procedure: LAPAROSCOPIC CHOLECYSTECTOMY WITH INTRAOPERATIVE CHOLANGIOGRAM;  Surgeon: Judieth Keens, DO;  Location: [place];  Service: General;  Laterality: N/A;  . COLONOSCOPY    . DILATION AND CURETTAGE OF UTERUS    . exploratory laparomty  Mar 04, 2012  . EYE SURGERY  2006   laser eye surgery left eye  . KNEE SURGERY Left 2012  . LAPAROSCOPIC APPENDECTOMY N/A 10/12/2012   Procedure: DIAGNOSTIC APPENDECTOMY LAPAROSCOPIC;  Surgeon: Madilyn Hook, DO;  Location: [facility];  Service: General;  Laterality: N/A;  . lipoma removed  yrs ago  . LIVER BIOPSY    . ORIF ANKLE FRACTURE Left 01/03/2016   Procedure: OPEN REDUCTION INTERNAL  FIXATION (ORIF) LEFT LISFRANC JOINT, GASTROC RECESSION;  Surgeon: Newt Minion, MD;  Location: [place];  Service: Orthopedics;  Laterality: Left;  . ROBOTIC ASSISTED LAPAROSCOPIC LYSIS OF ADHESION N/A 03/04/2012   Procedure: ROBOTIC ASSISTED LAPAROSCOPIC LYSIS OF EXTENSIVE ADHESIONS;  Surgeon: Claiborne Billings A. Pamala Hurry, MD;  Location: [place] [facility];  Service: Gynecology;  Laterality: N/A;  . TONSILLECTOMY  2001     reports that she has never smoked. She has never used smokeless tobacco. She reports current alcohol use. She reports that she does not use drugs.  Allergies  Allergen Reactions  . Cephalexin Anaphylaxis, Shortness Of Breath, Rash and Other (See Comments)    Pt was admitted to the hospital upon taking.   . Ibuprofen Nausea And Vomiting and Rash  . Meloxicam Nausea Only  . Reglan [Metoclopramide] Anxiety    Family History  Problem Relation Age of Onset  . Cancer Father        lymphoma  . Nephrolithiasis Father   . Vascular Disease Father   . Alcohol abuse Father   . Drug abuse Father   . Cancer Maternal Grandmother        colon  . Hypertension Mother   . Heart disease Mother   . Cataracts Mother   . Depression Mother   . Diabetes Brother   . Drug abuse Brother   . Mental illness Maternal Grandfather   . Cancer Maternal Grandfather   . Heart disease Paternal Grandmother   . Heart disease Paternal Grandfather   . Suicidality Maternal Uncle     Prior to Admission medications   Medication Sig Start Date End Date Taking? Authorizing Provider  gabapentin (NEURONTIN) 300 MG capsule TAKE 2 CAPSULES BY MOUTH 3 TIMES A DAY 04/03/17  Yes [provider]  atorvastatin (LIPITOR) 40 MG tablet Take 40 mg by mouth at bedtime.     [provider]  insulin aspart (NOVOLOG) 100 UNIT/ML injection Inject 8 Units into the skin 3 (three) times daily with meals. 03/05/16   Golden Circle, FNP  insulin glargine (LANTUS) 100 UNIT/ML injection Inject 0.28 mLs (28 Units total) into the  skin at bedtime. Patient taking differently: Inject 30 Units into the skin at bedtime.  03/05/16   Gold[place] Circle, FNP  lidocaine (LIDODERM) 5 % Place 1 patch onto the skin daily as needed (pain). Remove after 12 hours. 10/30/15   [provider]  losartan (COZAAR) 25 MG tablet Take by mouth. 08/18/17   [provider]  venlafaxine XR (EFFEXOR-XR) 37.5 MG 24 hr capsule Take by mouth. 10/02/17   [provider]  warfarin (  COUMADIN) 5 MG tablet TAKE 1 TABLET BY MOUTH DAILY AT 6PM. TAKE 1 & 1/2 TABLET ON [facility] 07/06/16   [provider]    Physical Exam: Vitals:   01/31/18 2256 02/01/18 0016 02/01/18 0149 02/01/18 0349  BP: 110/62 (!) 109/55 122/69 (!) 125/55  Pulse: 84 84 67 76  Resp: _0 Temp: 98.2 F (36.8 C) 98.2 F (36.8 C) 98 F (36.7 C) 98.1 F (36.7 C)  TempSrc: Oral Oral Oral Oral  SpO2: 100% 100% 100% 98%  Weight:      Height:        Physical Exam  Constitutional: She is oriented to person, place, and time. She appears well-developed and well-nourished. No distress.  HENT:  Head: Normocephalic.  Mouth/Throat: Oropharynx is clear and moist.  Eyes: Pupils are equal, round, and reactive to light. EOM are normal.  No conjunctival injection  Neck: Neck supple.  Cardiovascular: Normal rate, regular rhythm and intact distal pulses.  Pulmonary/Chest: Effort normal and breath sounds normal. No respiratory distress. She has no wheezes. She has no rales.  Abdominal: Soft. Bowel sounds are normal. She exhibits no distension. There is no abdominal tenderness. There is no guarding.  Musculoskeletal:        General: No edema.  Neurological: She is alert and oriented to person, place, and time. No cranial nerve deficit. Coordination normal.  Strength 5 out of 5 in bilateral upper and lower extremities.  Sensation to light touch intact throughout.  Speech fluent.  No facial droop.  Tongue midline.  Skin: Skin is warm and dry. She is not  diaphoretic.  Psychiatric: She has a normal mood and affect.     Labs on Admission: I have personally reviewed following labs and imaging studies  CBC: Recent Labs  Lab 01/31/18 1534  WBC 13.7*  NEUTROABS 10.7*  HGB 13.4  HCT 41.4  MCV 95.0  PLT 982   Basic Metabolic Panel: Recent Labs  Lab 01/31/18 1534  NA 130*  K 4.2  CL 92*  CO2 25  GLUCOSE 664*  BUN 13  CREATININE 1.17*  CALCIUM 9.4   GFR: Estimated Creatinine Clearance: 60.1 mL/min (A) (by C-G formula based on SCr of 1.17 mg/dL (H)). Liver Function Tests: Recent Labs  Lab 01/31/18 1534  AST 28  ALT 50*  ALKPHOS 112  BILITOT 1.5*  PROT 7.7  ALBUMIN 3.8   No results for input(s): LIPASE, AMYLASE in the last 168 hours. No results for input(s): AMMONIA in the last 168 hours. Coagulation Profile: Recent Labs  Lab 01/31/18 1534  INR 1.53   Cardiac Enzymes: Recent Labs  Lab 01/31/18 1534  TROPONINI <0.03   BNP (last 3 results) No results for input(s): PROBNP in the last 8760 hours. HbA1C: No results for input(s): HGBA1C in the last 72 hours. CBG: Recent Labs  Lab 01/31/18 1518 01/31/18 1805 01/31/18 1928 02/01/18 0014  GLUCAP >600* 502* 374* 332*   Lipid Profile: No results for input(s): CHOL, HDL, LDLCALC, TRIG, CHOLHDL, LDLDIRECT in the last 72 hours. Thyroid Function Tests: No results for input(s): TSH, T4TOTAL, FREET4, T3FREE, THYROIDAB in the last 72 hours. Anemia Panel: No results for input(s): VITAMINB12, FOLATE, FERRITIN, TIBC, IRON, RETICCTPCT in the last 72 hours. Urine analysis:    Component Value Date/Time   COLORURINE YELLOW 01/31/2018 1534   APPEARANCEUR CLEAR 01/31/2018 1534   LABSPEC <1.005 (L) 01/31/2018 1534   PHURINE 5.5 01/31/2018 1534   GLUCOSEU >=500 (A) 01/31/2018 1534   HGBUR NEGATIVE  01/31/2018 [place] 01/31/2018 1534   BILIRUBINUR negative 01/17/2015 1357   BILIRUBINUR small 07/18/2013 1817   KETONESUR 15 (A) 01/31/2018 1534    PROTEINUR NEGATIVE 01/31/2018 1534   UROBILINOGEN 1.0 01/17/2015 1357   UROBILINOGEN 0.2 10/25/2014 0927   NITRITE NEGATIVE 01/31/2018 1534   LEUKOCYTESUR NEGATIVE 01/31/2018 1534    Radiological Exams on Admission: Ct Angio Head W Or Wo Contrast  Result Date: 01/31/2018 CLINICAL DATA:  Near syncopal episode. Sudden left eye vision loss. History of traumatic brain injury. EXAM: CT ANGIOGRAPHY HEAD AND NECK TECHNIQUE: Multidetector CT imaging of the head and neck was performed using the standard protocol during bolus administration of intravenous contrast. Multiplanar CT image reconstructions and MIPs were obtained to evaluate the vascular anatomy. Carotid stenosis measurements (when applicable) are obtained utilizing NASCET criteria, using the distal internal carotid diameter as the denominator. CONTRAST:  154m ISOVUE-370 IOPAMIDOL (ISOVUE-370) INJECTION 76% COMPARISON:  Head CT 11/17/2014 FINDINGS: CT HEAD FINDINGS Brain: There is no mass, hemorrhage or extra-axial collection. The size and configuration of the ventricles and extra-axial CSF spaces are normal. There is no acute or chronic infarction. The brain parenchyma is normal. Skull: The visualized skull base, calvarium and extracranial soft tissues are normal. Sinuses/Orbits: No fluid levels or advanced mucosal thickening of the visualized paranasal sinuses. No mastoid or middle ear effusion. The orbits are normal. CTA NECK FINDINGS SKELETON: There is no bony spinal canal stenosis. No lytic or blastic lesion. OTHER NECK: Normal pharynx, larynx and major salivary glands. No cervical lymphadenopathy. Unremarkable thyroid gland. UPPER CHEST: No pneumothorax or pleural effusion. No nodules or masses. AORTIC ARCH: There is no calcific atherosclerosis of the aortic arch. There is no aneurysm, dissection or hemodynamically significant stenosis of the visualized ascending aorta and aortic arch. Conventional 3 vessel aortic branching pattern. The visualized  proximal subclavian arteries are widely patent. RIGHT CAROTID SYSTEM: --Common carotid artery: Widely patent origin without common carotid artery dissection or aneurysm. --Internal carotid artery: No dissection, occlusion or aneurysm. Mild atherosclerotic calcification at the carotid bifurcation without hemodynamically significant stenosis. --External carotid artery: No acute abnormality. LEFT CAROTID SYSTEM: --Common carotid artery: Widely patent origin without common carotid artery dissection or aneurysm. --Internal carotid artery: No dissection, occlusion or aneurysm. Mild atherosclerotic calcification at the carotid bifurcation without hemodynamically significant stenosis. --External carotid artery: No acute abnormality. VERTEBRAL ARTERIES: Codominant configuration. Both origins are normal. No dissection, occlusion or flow-limiting stenosis to the vertebrobasilar confluence. CTA HEAD FINDINGS POSTERIOR CIRCULATION: --Basilar artery: Normal. --Posterior cerebral arteries: Normal. Both originate from the basilar artery. --Superior cerebellar arteries: Normal. --Inferior cerebellar arteries: Normal anterior and posterior inferior cerebellar arteries. ANTERIOR CIRCULATION: --Intracranial internal carotid arteries: Normal. --Anterior cerebral arteries: Normal. Both A1 segments are present. Patent anterior communicating artery. --Middle cerebral arteries: Normal. --Posterior communicating arteries: Absent bilaterally. VENOUS SINUSES: As permitted by contrast timing, patent. ANATOMIC VARIANTS: None DELAYED PHASE: No parenchymal contrast enhancement. Review of the MIP images confirms the above findings. IMPRESSION: Normal CTA of the head and neck. Electronically Signed   By: KUlyses JarredM.D.   On: 01/31/2018 17:42   Ct Angio Neck W And/or Wo Contrast  Result Date: 01/31/2018 CLINICAL DATA:  Near syncopal episode. Sudden left eye vision loss. History of traumatic brain injury. EXAM: CT ANGIOGRAPHY HEAD AND NECK  TECHNIQUE: Multidetector CT imaging of the head and neck was performed using the standard protocol during bolus administration of intravenous contrast. Multiplanar CT image reconstructions and MIPs were obtained to evaluate the vascular  anatomy. Carotid stenosis measurements (when applicable) are obtained utilizing NASCET criteria, using the distal internal carotid diameter as the denominator. CONTRAST:  135m ISOVUE-370 IOPAMIDOL (ISOVUE-370) INJECTION 76% COMPARISON:  Head CT 11/17/2014 FINDINGS: CT HEAD FINDINGS Brain: There is no mass, hemorrhage or extra-axial collection. The size and configuration of the ventricles and extra-axial CSF spaces are normal. There is no acute or chronic infarction. The brain parenchyma is normal. Skull: The visualized skull base, calvarium and extracranial soft tissues are normal. Sinuses/Orbits: No fluid levels or advanced mucosal thickening of the visualized paranasal sinuses. No mastoid or middle ear effusion. The orbits are normal. CTA NECK FINDINGS SKELETON: There is no bony spinal canal stenosis. No lytic or blastic lesion. OTHER NECK: Normal pharynx, larynx and major salivary glands. No cervical lymphadenopathy. Unremarkable thyroid gland. UPPER CHEST: No pneumothorax or pleural effusion. No nodules or masses. AORTIC ARCH: There is no calcific atherosclerosis of the aortic arch. There is no aneurysm, dissection or hemodynamically significant stenosis of the visualized ascending aorta and aortic arch. Conventional 3 vessel aortic branching pattern. The visualized proximal subclavian arteries are widely patent. RIGHT CAROTID SYSTEM: --Common carotid artery: Widely patent origin without common carotid artery dissection or aneurysm. --Internal carotid artery: No dissection, occlusion or aneurysm. Mild atherosclerotic calcification at the carotid bifurcation without hemodynamically significant stenosis. --External carotid artery: No acute abnormality. LEFT CAROTID SYSTEM:  --Common carotid artery: Widely patent origin without common carotid artery dissection or aneurysm. --Internal carotid artery: No dissection, occlusion or aneurysm. Mild atherosclerotic calcification at the carotid bifurcation without hemodynamically significant stenosis. --External carotid artery: No acute abnormality. VERTEBRAL ARTERIES: Codominant configuration. Both origins are normal. No dissection, occlusion or flow-limiting stenosis to the vertebrobasilar confluence. CTA HEAD FINDINGS POSTERIOR CIRCULATION: --Basilar artery: Normal. --Posterior cerebral arteries: Normal. Both originate from the basilar artery. --Superior cerebellar arteries: Normal. --Inferior cerebellar arteries: Normal anterior and posterior inferior cerebellar arteries. ANTERIOR CIRCULATION: --Intracranial internal carotid arteries: Normal. --Anterior cerebral arteries: Normal. Both A1 segments are present. Patent anterior communicating artery. --Middle cerebral arteries: Normal. --Posterior communicating arteries: Absent bilaterally. VENOUS SINUSES: As permitted by contrast timing, patent. ANATOMIC VARIANTS: None DELAYED PHASE: No parenchymal contrast enhancement. Review of the MIP images confirms the above findings. IMPRESSION: Normal CTA of the head and neck. Electronically Signed   By: KUlyses JarredM.D.   On: 01/31/2018 17:42    EKG: Independently reviewed.  Sinus tachycardia (heart rate 101), baseline wander in V5.  Assessment/Plan Principal Problem:   Acute loss of vision Active Problems:   DM type 1 (diabetes mellitus, type 1) (HCC)   HLD (hyperlipidemia)   OSA (obstructive sleep apnea)   Leukocytosis   Hyperglycemia   Hyperbilirubinemia   VTE (venous thromboembolism)   Anxiety and depression   Acute onset vision loss/ near syncope Patient experienced acute onset vision loss in her left eye yesterday.  She felt dizzy at the time but did not have loss of consciousness.  No chest pain or shortness of breath.  CTA  head and neck normal.  Intraocular pressure check in the ED was 25 on the right and 30 on the left.  ED provider discussed the case with Dr. GKaty Fitchfrom ophthalmology who did not recommend being overly aggressive with treatment of the high intraocular pressure.  He will see the patient tomorrow and likely do a dilated exam.  Recommended TIA/stroke work-up.  Currently, patient states that her vision is blurry in the left eye and normal in the right eye. -Monitor on telemetry -MRI brain pending -Echocardiogram -  Check A1c, lipid panel -PT/OT/SLP -Risk factor modification -Tylenol PRN headaches -Ophthalmology will see the patient in the morning; appreciate recs  Mild leukocytosis Likely reactive.  White count 13.7.  Patient is afebrile.  Does not endorse any UTI symptoms such as dysuria, urinary frequency, or urgency.  No respiratory complaints. -Repeat CBC in a.m.  Hyperglycemia in the setting of type 1 diabetes Blood glucose 664 on arrival.  Patient reports drinking juice and eating at home prior to ED arrival.  Labs not suggestive of DKA (bicarb 25, anion gap 13).  She received NovoLog 8 units in the ED and CBG improved to 332. -Check A1c -Continue home NovoLog 3 times daily scheduled -Sliding scale insulin moderate with bedtime coverage -Continue home Lantus 28 units at bedtime -CBG checks  Mild hyperbilirubinemia T bili 1.5 and ALT borderline elevated at 50.  AST and alk phos normal.  T bili has previously been mildly elevated as well. -Right upper quadrant ultrasound  OSA -CPAP at night  Hyperlipidemia -Continue Lipitor  History of depression and anxiety -Unable to safely order home meds at this time as pharmacy medication reconciliation has not been done.  Hypertension Currently normotensive. 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  Patient states Coumadin was recently held as her INR  was supratherapeutic. -Coumadin per pharmacy  DVT prophylaxis: Lovenox Code Status: Full code Family Communication: Female friend at bedside. Disposition Plan: Anticipate discharge in 1 to 2 days. Consults called: None Admission status: Observation, telemetry   Shela Leff MD [facility] Pager [phone]  If 7PM-7AM, please contact night-coverage [email or web address] Password [facility]  02/01/2018, 4:53 AM

## 2018-02-01 NOTE — ED Notes (Signed)
Report to [facility]. ETA 20 minutes 

## 2018-02-02 DIAGNOSIS — K219 Gastro-esophageal reflux disease without esophagitis: Secondary | ICD-10-CM | POA: Diagnosis present

## 2018-02-02 DIAGNOSIS — H547 Unspecified visual loss: Secondary | ICD-10-CM

## 2018-02-02 DIAGNOSIS — M797 Fibromyalgia: Secondary | ICD-10-CM | POA: Diagnosis present

## 2018-02-02 DIAGNOSIS — Z90710 Acquired absence of both cervix and uterus: Secondary | ICD-10-CM | POA: Diagnosis not present

## 2018-02-02 DIAGNOSIS — H409 Unspecified glaucoma: Secondary | ICD-10-CM | POA: Diagnosis present

## 2018-02-02 DIAGNOSIS — F419 Anxiety disorder, unspecified: Secondary | ICD-10-CM

## 2018-02-02 DIAGNOSIS — Z79899 Other long term (current) drug therapy: Secondary | ICD-10-CM | POA: Diagnosis not present

## 2018-02-02 DIAGNOSIS — Z794 Long term (current) use of insulin: Secondary | ICD-10-CM | POA: Diagnosis not present

## 2018-02-02 DIAGNOSIS — E785 Hyperlipidemia, unspecified: Secondary | ICD-10-CM | POA: Diagnosis present

## 2018-02-02 DIAGNOSIS — R739 Hyperglycemia, unspecified: Secondary | ICD-10-CM | POA: Diagnosis present

## 2018-02-02 DIAGNOSIS — E1042 Type 1 diabetes mellitus with diabetic polyneuropathy: Secondary | ICD-10-CM | POA: Diagnosis present

## 2018-02-02 DIAGNOSIS — Z8249 Family history of ischemic heart disease and other diseases of the circulatory system: Secondary | ICD-10-CM | POA: Diagnosis not present

## 2018-02-02 DIAGNOSIS — Z8673 Personal history of transient ischemic attack (TIA), and cerebral infarction without residual deficits: Secondary | ICD-10-CM | POA: Diagnosis not present

## 2018-02-02 DIAGNOSIS — H53131 Sudden visual loss, right eye: Secondary | ICD-10-CM | POA: Diagnosis not present

## 2018-02-02 DIAGNOSIS — E1065 Type 1 diabetes mellitus with hyperglycemia: Secondary | ICD-10-CM | POA: Diagnosis present

## 2018-02-02 DIAGNOSIS — I951 Orthostatic hypotension: Secondary | ICD-10-CM | POA: Diagnosis present

## 2018-02-02 DIAGNOSIS — H543 Unqualified visual loss, both eyes: Secondary | ICD-10-CM | POA: Diagnosis present

## 2018-02-02 DIAGNOSIS — F329 Major depressive disorder, single episode, unspecified: Secondary | ICD-10-CM

## 2018-02-02 DIAGNOSIS — H5461 Unqualified visual loss, right eye, normal vision left eye: Secondary | ICD-10-CM | POA: Diagnosis not present

## 2018-02-02 DIAGNOSIS — F4310 Post-traumatic stress disorder, unspecified: Secondary | ICD-10-CM | POA: Diagnosis present

## 2018-02-02 DIAGNOSIS — I8290 Acute embolism and thrombosis of unspecified vein: Secondary | ICD-10-CM

## 2018-02-02 DIAGNOSIS — Z86718 Personal history of other venous thrombosis and embolism: Secondary | ICD-10-CM | POA: Diagnosis not present

## 2018-02-02 DIAGNOSIS — G4733 Obstructive sleep apnea (adult) (pediatric): Secondary | ICD-10-CM | POA: Diagnosis present

## 2018-02-02 DIAGNOSIS — N179 Acute kidney failure, unspecified: Secondary | ICD-10-CM

## 2018-02-02 DIAGNOSIS — Z833 Family history of diabetes mellitus: Secondary | ICD-10-CM | POA: Diagnosis not present

## 2018-02-02 DIAGNOSIS — Z86711 Personal history of pulmonary embolism: Secondary | ICD-10-CM | POA: Diagnosis not present

## 2018-02-02 DIAGNOSIS — D72829 Elevated white blood cell count, unspecified: Secondary | ICD-10-CM

## 2018-02-02 DIAGNOSIS — Z818 Family history of other mental and behavioral disorders: Secondary | ICD-10-CM | POA: Diagnosis not present

## 2018-02-02 DIAGNOSIS — I10 Essential (primary) hypertension: Secondary | ICD-10-CM | POA: Diagnosis present

## 2018-02-02 DIAGNOSIS — Z7901 Long term (current) use of anticoagulants: Secondary | ICD-10-CM | POA: Diagnosis not present

## 2018-02-02 LAB — BASIC METABOLIC PANEL
Anion gap: 8 (ref 5–15)
BUN: 10 mg/dL (ref 6–20)
CO2: 28 mmol/L (ref 22–32)
Calcium: 8.8 mg/dL — ABNORMAL LOW (ref 8.9–10.3)
Chloride: 104 mmol/L (ref 98–111)
Creatinine, Ser: 0.88 mg/dL (ref 0.44–1.00)
GFR calc Af Amer: >60 mL/min (ref >60)
GFR calc non Af Amer: >60 mL/min (ref >60)
Glucose, Bld: 146 mg/dL — ABNORMAL HIGH (ref 70–99)
Potassium: 3.3 mmol/L — ABNORMAL LOW (ref 3.5–5.1)
Sodium: 140 mmol/L (ref 135–145)

## 2018-02-02 LAB — PHOSPHORUS: Phosphorus: 3.3 mg/dL (ref 2.5–4.6)

## 2018-02-02 LAB — CBC WITH DIFFERENTIAL/PLATELET
Abs Immature Granulocytes: 0.02 10*3/uL (ref 0.00–0.07)
Basophils Absolute: 0.1 10*3/uL (ref 0.0–0.1)
Basophils Relative: 1 %
Eosinophils Absolute: 0.2 10*3/uL (ref 0.0–0.5)
Eosinophils Relative: 2 %
HCT: 38 % (ref 36.0–46.0)
Hemoglobin: 12.9 g/dL (ref 12.0–15.0)
Immature Granulocytes: 0 %
Lymphocytes Relative: 26 %
Lymphs Abs: 2.9 10*3/uL (ref 0.7–4.0)
MCH: 31.9 pg (ref 26.0–34.0)
MCHC: 33.9 g/dL (ref 30.0–36.0)
MCV: 94.1 fL (ref 80.0–100.0)
Monocytes Absolute: 0.6 10*3/uL (ref 0.1–1.0)
Monocytes Relative: 5 %
Neutro Abs: 7.3 10*3/uL (ref 1.7–7.7)
Neutrophils Relative %: 66 %
Platelets: 301 10*3/uL (ref 150–400)
RBC: 4.04 MIL/uL (ref 3.87–5.11)
RDW: 12 % (ref 11.5–15.5)
WBC: 11 10*3/uL — ABNORMAL HIGH (ref 4.0–10.5)
nRBC: 0 % (ref 0.0–0.2)

## 2018-02-02 LAB — PROTIME-INR
INR: 1.55
Prothrombin Time: 18.4 seconds — ABNORMAL HIGH (ref 11.4–15.2)

## 2018-02-02 LAB — HEPATIC FUNCTION PANEL
ALT: 33 U/L (ref 0–44)
AST: 20 U/L (ref 15–41)
Albumin: 3 g/dL — ABNORMAL LOW (ref 3.5–5.0)
Alkaline Phosphatase: 83 U/L (ref 38–126)
Bilirubin, Direct: 0.2 mg/dL (ref 0.0–0.2)
Indirect Bilirubin: 0.6 mg/dL (ref 0.3–0.9)
Total Bilirubin: 0.8 mg/dL (ref 0.3–1.2)
Total Protein: 6.6 g/dL (ref 6.5–8.1)

## 2018-02-02 LAB — GLUCOSE, CAPILLARY
Glucose-Capillary: 134 mg/dL — ABNORMAL HIGH (ref 70–99)
Glucose-Capillary: 249 mg/dL — ABNORMAL HIGH (ref 70–99)
Glucose-Capillary: 117 mg/dL — ABNORMAL HIGH (ref 70–99)
Glucose-Capillary: 62 mg/dL — ABNORMAL LOW (ref 70–99)

## 2018-02-02 LAB — MAGNESIUM: Magnesium: 1.8 mg/dL (ref 1.7–2.4)

## 2018-02-02 MED ORDER — RIVAROXABAN 20 MG PO TABS
20.0000 mg | ORAL_TABLET | Freq: Every day | ORAL | Status: DC
  Administered 2018-02-02: 20 mg via ORAL
  Filled 2018-02-02: qty 1

## 2018-02-02 MED ORDER — RIVAROXABAN (XARELTO) VTE STARTER PACK (15 & 20 MG): ORAL_TABLET | ORAL | 0 refills | Status: DC

## 2018-02-02 MED ORDER — INSULIN GLARGINE 100 UNIT/ML ~~LOC~~ SOLN: 32.0000 [IU] | Freq: Every day | SUBCUTANEOUS | 11 refills | Status: DC

## 2018-02-02 MED ORDER — WARFARIN SODIUM 7.5 MG PO TABS: 7.5000 mg | ORAL_TABLET | Freq: Once | ORAL | Status: DC

## 2018-02-02 MED ORDER — SODIUM CHLORIDE 0.9 % IV BOLUS
1000.0000 mL | Freq: Once | INTRAVENOUS | Status: AC
  Administered 2018-02-02: 1000 mL via INTRAVENOUS

## 2018-02-02 MED ORDER — SODIUM CHLORIDE 0.9 % IV SOLN
INTRAVENOUS | Status: AC
  Administered 2018-02-02 – 2018-02-03 (×2): via INTRAVENOUS

## 2018-02-02 MED ORDER — POTASSIUM CHLORIDE CRYS ER 20 MEQ PO TBCR
40.0000 meq | EXTENDED_RELEASE_TABLET | Freq: Two times a day (BID) | ORAL | Status: AC
  Administered 2018-02-02 (×2): 40 meq via ORAL
  Filled 2018-02-02 (×2): qty 2

## 2018-02-02 NOTE — Progress Notes (Signed)
PROGRESS NOTE    Maureen [name]  IWL:[id number] DOB: 10/04/1970 DOA: 01/31/2018 PCP: System, Pcp Not In   Brief Narrative:  HPI per Dr. Shela Leff on 02/01/2018 Maureen [name] is a 48 y.o. female with medical history significant of TIA, cataracts, glaucoma, DVT on anticoagulation, type 1 diabetes, hyperlipidemia, depression, anxiety, OSA on CPAP presenting as a transfer from [place] ED for evaluation of acute onset vision loss.  Patient states that yesterday 1/13 around 3 PM she was walking and suddenly felt dizzy and her vision started turning dark in both eyes.  This lasted a few seconds and then she regained vision in her right eye but not the left eye.  Denies having any chest pain, shortness of breath, or loss of consciousness.  States now the vision in her left eye has returned but it is blurry.  She was having a headache at the time when she experienced sudden onset vision loss.  The headache is left-sided frontal/periorbital.  States she has previously had 2 surgeries for glaucoma last year in July.  She also has cataracts in both of her eyes.  States her ophthalmologist is Dr. Nancy Fetter.  Patient states she has type 1 diabetes and when her vision loss and dizziness happened she thought it was due to low blood sugar.  As such, she ate and drank juice.  She believes that could be contributing to her high blood sugar checked in the ED.  States she was diagnosed with type I high diabetes at the age of 28.  She uses scheduled NovoLog 8 units 3 times a day along along with sliding scale insulin with meals.  In addition uses Lantus 28 units at bedtime.  Reports compliance with insulin and did not run out of any for her diabetes medications. States she is on lifelong anticoagulation with Coumadin due to history of PE in 2016 and DVT in 2017.  Per patient, her INR was 6.3 last week on Thursday and she was advised to hold Coumadin for 2 days.  She then started taking the medication  yesterday.  **Vision is improving but persistently remaining Orthostatic so will bolus 1 Liter and place back on Maintenance IVF with NS at 75 mL/hr.  Assessment & Plan:   Principal Problem:   Acute loss of vision Active Problems:   DM type 1 (diabetes mellitus, type 1) (HCC)   HLD (hyperlipidemia)   OSA (obstructive sleep apnea)   Leukocytosis   Hyperglycemia   Hyperbilirubinemia   VTE (venous thromboembolism)   Anxiety and depression   Orthostatic hypotension   Uncontrolled type 1 diabetes mellitus with hyperglycemia, with long-term current use of insulin (HCC)  Acute Loss of Vision -Possibly associated with hyperglycemia and orthostatic hypotension.   -MRI of the brain negative for stroke.   -Seen by ophthalmologist (Dr. Carolynn Sayers) yesterday morning,  -No acute findings.   -Had Initial Stroke Workup and ECHO done -Vision starting to improve.   -Will need Outpatient follow-up with ophthalmology.  DM type 1 (diabetes mellitus, type 1), uncontrolled with hyperglycemia (HCC) -Hemoglobin A1c was 10.4 -On Lantus 28 units at bedtime and short-acting insulin 8 units 3 times a day with meals.   -Reports her CBGs mostly in the 200s with low normal in the morning.   -Reports being adherent to her insulin but being on Coumadin she has several food restrictions. -CBGs in 600 with no anion gap on admission.  This may be causing her to have impaired vision. -CBG's improved. -Will  increase Lantus to 32 units starting a.m.   -Continue with sensitive NovoLog sliding scale before meals and at bedtime -Continue to Monitor CBG's; CBG's ranging from 134-249 -Ordered bedtime snack to prevent a.m. hypoglycemia. -Diabetic coordinator consulted for medication adjustment, provide outpatient resources. -Patient reports getting an insurance in near future and plans to establish care with an endocrinologist.  Orthostatic Hypotension, persistent  -Noted to have blood pressure dropped from 130/64 supine  to 104/70 standing, feeling dizzy as well.   -Repeated Orthostatics this AM and she was still symptomatic  -She was placed on IVF with NS at 125 mL/hr and this was stopped sometime yesterday but will resume Maintenance IVF at 75 mL/hr due to persistent orthostasis  -Will bolus 1 Liter of NS -TED Hose ordered  -ECHO Normal  -Since she was symptomatic again will watch again overnight and C/w IVF Hydration   Mild Transaminitis -Ultrasound abdomen unremarkable. -Hepatic Panel Normal   Obstructive sleep apnea -Continue CPAP  History of DVT and PE -On lifelong anticoagulation with Coumadin.   -Dosing per pharmacy.   -Patient wishes to be on NOAC medication and is more affordable.  Care management consulted and will transition to [place] consulted   HLD (hyperlipidemia) -Continue Lipitor.  Has mild transaminitis which can be monitored closely but this is improved  Headache -PRN Tylenol, give 1 dose of tramadol.  History of Glaucoma and cataract Continue eyedrops.  Needs outpatient ophthalmology follow-up.  Leukocytosis, -Improving. ? Reactive or in the setting of Dehydration -WBC went from 12.9 -> 11.0 -No current S/Sx of Infection -Repeat CBC in AM  Hypokalemia -Patient's K+ was 3.3 this AM -Replete with po KCl 40 mEQ BID x 2 Doses -Continue to Monitor and Replete as Necessary -Repeat CMP in AM   Overweight -Estimated body mass index is 28.32 kg/m as calculated from the following:   Height as of this encounter: 5\' 4"  (1.626 m).   Weight as of this encounter: 74.8 kg. -Weight Loss Counseling given   AKI -Improving -C/w IVF Hydration as above -BUN/Cr is now 10/0.88 and on admission was 13/1.17 -Avoid Nephrotoxic Medications if possible -Repeat CMP in AM   DVT prophylaxis: Anticoagulated with Xarelto Code Status: FULL CODE Family Communication: No Family present at bedside  Disposition Plan: Home when orthostasis resloves and improved. Blood Sugars  are better stable.   Consultants:   Opthalmology    Procedures:  ECHOCARDIOGRAM ------------------------------------------------------------------- Study Conclusions  - Left ventricle: The cavity size was normal. Systolic function was   normal. The estimated ejection fraction was in the range of 55%   to 60%. Wall motion was normal; there were no regional wall   motion abnormalities. Left ventricular diastolic function   parameters were normal. - Atrial septum: No defect or patent foramen ovale was identified.   Antimicrobials:  Anti-infectives (From admission, onward)   None     Subjective: Patient was seen and examined this morning at bedside states that her vision is improving.  No nausea or vomiting but stated that she still had a headache somewhat.  When she stood up again with nursing for orthostatics her blood pressure dropped again and she became dizzy.  No chest pain.  No other concerns or complaints at this time  Objective: Vitals:   02/02/18 1420 02/02/18 1422 02/02/18 1423 02/02/18 1424  BP: 116/67 111/66 93/67 96/65   Pulse:      Resp:      Temp:      TempSrc:  SpO2:      Weight:      Height:        Intake/Output Summary (Last 24 hours) at 02/02/2018 1437 Last data filed at 02/02/2018 0600 Gross per 24 hour  Intake 1647.51 ml  Output -  Net 1647.51 ml   Filed Weights   01/31/18 1507  Weight: 74.8 kg   Examination: Physical Exam:  Constitutional: WN/WD overweight AAF in NAD and appears calm and comfortable Eyes: Lids and conjunctivae normal, sclerae anicteric  ENMT: External Ears, Nose appear normal. Grossly normal hearing.  Neck: Appears normal, supple, no cervical masses, normal ROM, no appreciable thyromegaly; no JVD Respiratory: Diminished to auscultation bilaterally, no wheezing, rales, rhonchi or crackles. Normal respiratory effort and patient is not tachypenic. No accessory muscle use.  Cardiovascular: RRR, no murmurs / rubs / gallops.  S1 and S2 auscultated. No extremity edema.  Abdomen: Soft, non-tender, Distended slightly 2/2 body habitus. No masses palpated. No appreciable hepatosplenomegaly. Bowel sounds positive.  GU: Deferred. Musculoskeletal: No clubbing / cyanosis of digits/nails. No joint deformity upper and lower extremities.  Skin: No rashes, lesions, ulcers on a limited skin evaluation. No induration; Warm and dry.  Neurologic: CN 2-12 grossly intact with no focal deficits. Romberg sign and cerebellar reflexes not assessed.  Psychiatric: Normal judgment and insight. Alert and oriented x 3. Normal mood and appropriate affect.   Data Reviewed: I have personally reviewed following labs and imaging studies  CBC: Recent Labs  Lab 01/31/18 1534 02/01/18 0449 02/02/18 0827  WBC 13.7* 12.9* 11.0*  NEUTROABS 10.7*  --  7.3  HGB 13.4 14.2 12.9  HCT 41.4 41.7 38.0  MCV 95.0 93.3 94.1  PLT 318 275 606   Basic Metabolic Panel: Recent Labs  Lab 01/31/18 1534 02/01/18 0426 02/02/18 0827  NA 130* 134* 140  K 4.2 3.7 3.3*  CL 92* 95* 104  CO2 25 26 28   GLUCOSE 664* 353* 146*  BUN 13 9 10   CREATININE 1.17* 1.05* 0.88  CALCIUM 9.4 9.3 8.8*  MG  --   --  1.8  PHOS  --   --  3.3   GFR: Estimated Creatinine Clearance: 79.9 mL/min (by C-G formula based on SCr of 0.88 mg/dL). Liver Function Tests: Recent Labs  Lab 01/31/18 1534 02/02/18 0434  AST 28 20  ALT 50* 33  ALKPHOS 112 83  BILITOT 1.5* 0.8  PROT 7.7 6.6  ALBUMIN 3.8 3.0*   No results for input(s): LIPASE, AMYLASE in the last 168 hours. No results for input(s): AMMONIA in the last 168 hours. Coagulation Profile: Recent Labs  Lab 01/31/18 1534 02/01/18 0449 02/02/18 0434  INR 1.53 1.56 1.55   Cardiac Enzymes: Recent Labs  Lab 01/31/18 1534  TROPONINI <0.03   BNP (last 3 results) No results for input(s): PROBNP in the last 8760 hours. HbA1C: Recent Labs    02/01/18 0426  HGBA1C 10.4*   CBG: Recent Labs  Lab 02/01/18 1259  02/01/18 1646 02/01/18 2127 02/02/18 0626 02/02/18 1127  GLUCAP 116* 156* 135* 134* 249*   Lipid Profile: Recent Labs    02/01/18 0426  CHOL 146  HDL 49  LDLCALC 80  TRIG 87  CHOLHDL 3.0   Thyroid Function Tests: No results for input(s): TSH, T4TOTAL, FREET4, T3FREE, THYROIDAB in the last 72 hours. Anemia Panel: No results for input(s): VITAMINB12, FOLATE, FERRITIN, TIBC, IRON, RETICCTPCT in the last 72 hours. Sepsis Labs: No results for input(s): PROCALCITON, LATICACIDVEN in the last 168 hours.  Radiology Studies: Ct  Angio Head W Or Wo Contrast  Result Date: 01/31/2018 CLINICAL DATA:  Near syncopal episode. Sudden left eye vision loss. History of traumatic brain injury. EXAM: CT ANGIOGRAPHY HEAD AND NECK TECHNIQUE: Multidetector CT imaging of the head and neck was performed using the standard protocol during bolus administration of intravenous contrast. Multiplanar CT image reconstructions and MIPs were obtained to evaluate the vascular anatomy. Carotid stenosis measurements (when applicable) are obtained utilizing NASCET criteria, using the distal internal carotid diameter as the denominator. CONTRAST:  166mL ISOVUE-370 IOPAMIDOL (ISOVUE-370) INJECTION 76% COMPARISON:  Head CT 11/17/2014 FINDINGS: CT HEAD FINDINGS Brain: There is no mass, hemorrhage or extra-axial collection. The size and configuration of the ventricles and extra-axial CSF spaces are normal. There is no acute or chronic infarction. The brain parenchyma is normal. Skull: The visualized skull base, calvarium and extracranial soft tissues are normal. Sinuses/Orbits: No fluid levels or advanced mucosal thickening of the visualized paranasal sinuses. No mastoid or middle ear effusion. The orbits are normal. CTA NECK FINDINGS SKELETON: There is no bony spinal canal stenosis. No lytic or blastic lesion. OTHER NECK: Normal pharynx, larynx and major salivary glands. No cervical lymphadenopathy. Unremarkable thyroid gland. UPPER  CHEST: No pneumothorax or pleural effusion. No nodules or masses. AORTIC ARCH: There is no calcific atherosclerosis of the aortic arch. There is no aneurysm, dissection or hemodynamically significant stenosis of the visualized ascending aorta and aortic arch. Conventional 3 vessel aortic branching pattern. The visualized proximal subclavian arteries are widely patent. RIGHT CAROTID SYSTEM: --Common carotid artery: Widely patent origin without common carotid artery dissection or aneurysm. --Internal carotid artery: No dissection, occlusion or aneurysm. Mild atherosclerotic calcification at the carotid bifurcation without hemodynamically significant stenosis. --External carotid artery: No acute abnormality. LEFT CAROTID SYSTEM: --Common carotid artery: Widely patent origin without common carotid artery dissection or aneurysm. --Internal carotid artery: No dissection, occlusion or aneurysm. Mild atherosclerotic calcification at the carotid bifurcation without hemodynamically significant stenosis. --External carotid artery: No acute abnormality. VERTEBRAL ARTERIES: Codominant configuration. Both origins are normal. No dissection, occlusion or flow-limiting stenosis to the vertebrobasilar confluence. CTA HEAD FINDINGS POSTERIOR CIRCULATION: --Basilar artery: Normal. --Posterior cerebral arteries: Normal. Both originate from the basilar artery. --Superior cerebellar arteries: Normal. --Inferior cerebellar arteries: Normal anterior and posterior inferior cerebellar arteries. ANTERIOR CIRCULATION: --Intracranial internal carotid arteries: Normal. --Anterior cerebral arteries: Normal. Both A1 segments are present. Patent anterior communicating artery. --Middle cerebral arteries: Normal. --Posterior communicating arteries: Absent bilaterally. VENOUS SINUSES: As permitted by contrast timing, patent. ANATOMIC VARIANTS: None DELAYED PHASE: No parenchymal contrast enhancement. Review of the MIP images confirms the above findings.  IMPRESSION: Normal CTA of the head and neck. Electronically Signed   By: Ulyses Jarred M.D.   On: 01/31/2018 17:42   Ct Angio Neck W And/or Wo Contrast  Result Date: 01/31/2018 CLINICAL DATA:  Near syncopal episode. Sudden left eye vision loss. History of traumatic brain injury. EXAM: CT ANGIOGRAPHY HEAD AND NECK TECHNIQUE: Multidetector CT imaging of the head and neck was performed using the standard protocol during bolus administration of intravenous contrast. Multiplanar CT image reconstructions and MIPs were obtained to evaluate the vascular anatomy. Carotid stenosis measurements (when applicable) are obtained utilizing NASCET criteria, using the distal internal carotid diameter as the denominator. CONTRAST:  161mL ISOVUE-370 IOPAMIDOL (ISOVUE-370) INJECTION 76% COMPARISON:  Head CT 11/17/2014 FINDINGS: CT HEAD FINDINGS Brain: There is no mass, hemorrhage or extra-axial collection. The size and configuration of the ventricles and extra-axial CSF spaces are normal. There is no acute or chronic infarction.  The brain parenchyma is normal. Skull: The visualized skull base, calvarium and extracranial soft tissues are normal. Sinuses/Orbits: No fluid levels or advanced mucosal thickening of the visualized paranasal sinuses. No mastoid or middle ear effusion. The orbits are normal. CTA NECK FINDINGS SKELETON: There is no bony spinal canal stenosis. No lytic or blastic lesion. OTHER NECK: Normal pharynx, larynx and major salivary glands. No cervical lymphadenopathy. Unremarkable thyroid gland. UPPER CHEST: No pneumothorax or pleural effusion. No nodules or masses. AORTIC ARCH: There is no calcific atherosclerosis of the aortic arch. There is no aneurysm, dissection or hemodynamically significant stenosis of the visualized ascending aorta and aortic arch. Conventional 3 vessel aortic branching pattern. The visualized proximal subclavian arteries are widely patent. RIGHT CAROTID SYSTEM: --Common carotid artery: Widely  patent origin without common carotid artery dissection or aneurysm. --Internal carotid artery: No dissection, occlusion or aneurysm. Mild atherosclerotic calcification at the carotid bifurcation without hemodynamically significant stenosis. --External carotid artery: No acute abnormality. LEFT CAROTID SYSTEM: --Common carotid artery: Widely patent origin without common carotid artery dissection or aneurysm. --Internal carotid artery: No dissection, occlusion or aneurysm. Mild atherosclerotic calcification at the carotid bifurcation without hemodynamically significant stenosis. --External carotid artery: No acute abnormality. VERTEBRAL ARTERIES: Codominant configuration. Both origins are normal. No dissection, occlusion or flow-limiting stenosis to the vertebrobasilar confluence. CTA HEAD FINDINGS POSTERIOR CIRCULATION: --Basilar artery: Normal. --Posterior cerebral arteries: Normal. Both originate from the basilar artery. --Superior cerebellar arteries: Normal. --Inferior cerebellar arteries: Normal anterior and posterior inferior cerebellar arteries. ANTERIOR CIRCULATION: --Intracranial internal carotid arteries: Normal. --Anterior cerebral arteries: Normal. Both A1 segments are present. Patent anterior communicating artery. --Middle cerebral arteries: Normal. --Posterior communicating arteries: Absent bilaterally. VENOUS SINUSES: As permitted by contrast timing, patent. ANATOMIC VARIANTS: None DELAYED PHASE: No parenchymal contrast enhancement. Review of the MIP images confirms the above findings. IMPRESSION: Normal CTA of the head and neck. Electronically Signed   By: Ulyses Jarred M.D.   On: 01/31/2018 17:42   Mr Brain Wo Contrast  Result Date: 02/01/2018 CLINICAL DATA:  Sudden onset vision loss in the left eye EXAM: MRI HEAD WITHOUT CONTRAST TECHNIQUE: Multiplanar, multiecho pulse sequences of the brain and surrounding structures were obtained without intravenous contrast. COMPARISON:  Head CT from  yesterday.  Brain MRI 11/19/2015 FINDINGS: Brain: No acute infarction, hemorrhage, hydrocephalus, extra-axial collection or mass lesion. No evidence of demyelination. Clear suprasellar cistern. Vascular: Major flow voids are preserved Skull and upper cervical spine: Negative for marrow lesion Sinuses/Orbits: Negative. No gross orbital inflammation, mass, or intra-ocular collection. IMPRESSION: Negative brain MRI. Electronically Signed   By: Monte Fantasia M.D.   On: 02/01/2018 11:54   US Abdomen Limited Ruq  Result Date: 02/01/2018 CLINICAL DATA:  Elevated total bilirubin.  Cholecystectomy. EXAM: ULTRASOUND ABDOMEN LIMITED RIGHT UPPER QUADRANT COMPARISON:  CT scan of the abdomen and pelvis dated 01/10/2017 FINDINGS: Gallbladder: Removed. Common bile duct: Diameter: 4.8 mm, normal. Liver: No focal lesion identified. Within normal limits in parenchymal echogenicity. Portal vein is patent on color Doppler imaging with normal direction of blood flow towards the liver. IMPRESSION: Normal exam. The gallbladder has been removed. No dilated bile ducts. Electronically Signed   By: Lorriane Shire M.D.   On: 02/01/2018 12:16   Scheduled Meds: . atorvastatin  40 mg Oral QHS  . enoxaparin (LOVENOX) injection  1 mg/kg Subcutaneous Q12H  . insulin aspart  0-5 Units Subcutaneous QHS  . insulin aspart  0-9 Units Subcutaneous TID WC  . insulin aspart  8 Units Subcutaneous TID WC  .  insulin glargine  32 Units Subcutaneous Daily  . potassium chloride  40 mEq Oral BID  . warfarin  7.5 mg Oral ONCE-1800  . Warfarin - Pharmacist Dosing Inpatient   Does not apply q1800   Continuous Infusions: . sodium chloride    . sodium chloride      LOS: 0 days   Kerney Elbe, DO [facility] PAGER is on [place]  If 7PM-7AM, please contact night-coverage [email or web address] Password [facility] 02/02/2018, 2:37 PM

## 2018-02-02 NOTE — Evaluation (Signed)
Speech Language Pathology Evaluation Patient Details Name: Maureen [name] MRN: [id number] DOB: 03-30-1970 Today's Date: 02/02/2018 Time: 1010(1010)-1020 SLP Time Calculation (min) (ACUTE ONLY): 10 min  Problem List:  Patient Active Problem List   Diagnosis Date Noted  . Acute loss of vision 02/01/2018  . Leukocytosis 02/01/2018  . Hyperglycemia 02/01/2018  . Hyperbilirubinemia 02/01/2018  . VTE (venous thromboembolism) 02/01/2018  . Anxiety and depression 02/01/2018  . Orthostatic hypotension 02/01/2018  . Uncontrolled type 1 diabetes mellitus with hyperglycemia, with long-term current use of insulin ([place]) 02/01/2018  . OSA (obstructive sleep apnea) 05/14/2016  . Obesity (BMI 30-39.9) 05/14/2016  . Acute deep vein thrombosis (DVT) of femoral vein of left lower extremity ([place]) 02/10/2016  . Diabetic polyneuropathy associated with type 2 diabetes mellitus ([place]) 02/10/2016  . Lisfranc dislocation, left, sequela 12/16/2015  . Short Achilles tendon (acquired), left ankle 12/16/2015  . Chest pain with moderate risk for cardiac etiology 11/29/2015  . Elevated troponin 11/19/2015  . Migraine headache 11/19/2015  . Neck pain 11/19/2015  . Acute kidney injury ([place]) 02/19/2015  . DKA (diabetic ketoacidoses) ([place]) 10/24/2014  . HLD (hyperlipidemia) 10/24/2014  . Abnormal LFTs 09/03/2014  . Dizziness 09/03/2014  . Acute pulmonary embolism ([place]) 09/03/2014  . Transaminitis 09/02/2014  . GAD (generalized anxiety disorder) 11/23/2013  . PTSD (post-traumatic stress disorder) 11/23/2013  . Severe major depression without psychotic features ([place]) 10/10/2013  . Endometriosis 12/08/2011  . Arthritis of left knee 12/08/2011  . Diabetic retinopathy ([place]) 12/08/2011  . Fibromyalgia 10/23/2011  . DM type 1 (diabetes mellitus, type 1) ([place]) 10/13/2011   Past Medical History:  Past Medical History:  Diagnosis Date  . Abdominal pain   . Achilles tendon contracture, left    with Lisfranc  fracture  . Anxiety   . Arthritis   . Biliary dyskinesia   . Cervical strain   . Closed head injury 2010  . Depression   . DVT (deep venous thrombosis) ([place])   . Elevated LFTs   . Fibromyalgia   . GERD (gastroesophageal reflux disease)   . History of kidney stones   . Hyperlipidemia   . Hypoglycemia   . Joint pain   . Major depressive disorder   . Migraine    hx of none recent  . Nausea & vomiting   . OSA on CPAP 05/14/2016   Moderate to severe OSA with an AHI of 28/hr now on CPAP at 19cm H2O  . Pneumonia   . Post traumatic stress disorder (PTSD)   . Pulmonary embolism ([place])   . Renal stones   . Skin rash    due to medications  . Sleep trouble   . Stroke [facility])    " series of mini strokes around 2007"  . TIA (transient ischemic attack)   . Wears glasses   . Well controlled type 1 diabetes mellitus with peripheral neuropathy ([place])    Type 1   Past Surgical History:  Past Surgical History:  Procedure Laterality Date  . ABDOMINAL HYSTERECTOMY  2001  . APPENDECTOMY    . [place]  . CHOLECYSTECTOMY  02/10/2011   Procedure: LAPAROSCOPIC CHOLECYSTECTOMY WITH INTRAOPERATIVE CHOLANGIOGRAM;  Surgeon: Judieth Keens, DO;  Location: [facility];  Service: General;  Laterality: N/A;  . COLONOSCOPY    . DILATION AND CURETTAGE OF UTERUS    . exploratory laparomty  Mar 04, 2012  . EYE SURGERY  2006   laser eye surgery left eye  . KNEE SURGERY Left 2012  .  LAPAROSCOPIC APPENDECTOMY N/A 10/12/2012   Procedure: DIAGNOSTIC APPENDECTOMY LAPAROSCOPIC;  Surgeon: Madilyn Hook, DO;  Location: [facility];  Service: General;  Laterality: N/A;  . lipoma removed  yrs ago  . LIVER BIOPSY    . ORIF ANKLE FRACTURE Left 01/03/2016   Procedure: OPEN REDUCTION INTERNAL FIXATION (ORIF) LEFT LISFRANC JOINT, GASTROC RECESSION;  Surgeon: Newt Minion, MD;  Location: [facility];  Service: Orthopedics;  Laterality: Left;  . ROBOTIC ASSISTED LAPAROSCOPIC LYSIS OF ADHESION N/A 03/04/2012    Procedure: ROBOTIC ASSISTED LAPAROSCOPIC LYSIS OF EXTENSIVE ADHESIONS;  Surgeon: Claiborne Billings A. Pamala Hurry, MD;  Location: [facility];  Service: Gynecology;  Laterality: N/A;  . TONSILLECTOMY  2001   HPI:  Patient is a 48 y/o female who presents with sudden vision loss in left eye and near syncopal episode. Negative MRI. PMH includes DM, PE, depression, PTSD, fibromyalgia, major depressive disorder, closed head injury, migraine.    Assessment / Plan / Recommendation Clinical Impression  Pt presents with intact cognitive-linguistic abilities. Pt does report that she initially experienced word finding deficits but issue has resolved itself. Pt is well educated on TIAs and stroke symptoms. Such any word finding deficits arise, ST can be reconsulted. ST to sign off at this time.     SLP Assessment  SLP Recommendation/Assessment: Patient does not need any further Speech Lanaguage Pathology Services SLP Visit Diagnosis: Aphasia (R47.01)    Follow Up Recommendations  None    Frequency and Duration           SLP Evaluation Cognition  Overall Cognitive Status: Within Functional Limits for tasks assessed Arousal/Alertness: Awake/alert Orientation Level: Oriented X4       Comprehension  Auditory Comprehension Overall Auditory Comprehension: Appears within functional limits for tasks assessed Visual Recognition/Discrimination Discrimination: Within Function Limits Reading Comprehension Reading Status: Not tested    Expression Expression Primary Mode of Expression: Verbal Verbal Expression Overall Verbal Expression: Appears within functional limits for tasks assessed Written Expression Dominant Hand: Right Written Expression: Not tested   Oral / Motor  Motor Speech Overall Motor Speech: Appears within functional limits for tasks assessed Respiration: Within functional limits Phonation: Normal Resonance: Within functional limits Articulation: Within functional limitis Intelligibility:  Intelligible Motor Planning: Witnin functional limits Motor Speech Errors: Not applicable   GO                    Elgene Coral 02/02/2018, 12:06 PM

## 2018-02-02 NOTE — Care Management Obs Status (Signed)
[place] NOTIFICATION   Patient Details  Name: Maureen [name] MRN: [id number] Date of Birth: 25-Feb-1970   Medicare Observation Status Notification Given:  Yes    Pollie Friar, RN 02/02/2018, 11:59 AM

## 2018-02-02 NOTE — Plan of Care (Signed)
  RD consulted for nutrition education regarding diabetes.   Lab Results  Component Value Date   HGBA1C 10.4 (H) 02/01/2018    RD provided "Type 1 Diabetes Nutrition Therapy" handout from the Academy of Nutrition and Dietetics. Pt with history of Type 1 Diabetes Mellitus since the age of 70. Provided list of carbohydrates and recommended serving sizes of common foods. Teach back method used. Pt reports she regularly counts carbohydrates at meals and snacks and covers with insulin, however pt unable to obtain insulin recently due to financial instability. Pt reports she will soon be covered under insurance, thus will be able to obtain her diabetes medication easily.  Expect good compliance.  Body mass index is 28.32 kg/m. Pt meets criteria for overweight based on current BMI.  Current diet order is carbohydrate modified. Pt reports having a good appetite with no other difficulties. Labs and medications reviewed. No further nutrition interventions warranted at this time. RD contact information provided. If additional nutrition issues arise, please re-consult RD.  Corrin Parker, MS, RD, LDN Pager # [phone] After hours/ weekend pager # [phone]

## 2018-02-02 NOTE — Care Management (Signed)
#  2.   S/W [employer]  @ [facility] # [phone]   1. XARELTO 20 MG DAILY COVER- YES CO-PAY- $ 47.00   Q/L ONE PILL PER DAY TIER- 3 DRUG PRIOR APPROVAL- NO  2. ELIQUIS  5 MG BID COVER- YES CO-PAY_ $ 47.00 TIER- 3 DRUG PRIOR APPROVAL- NO  INSULIN:  3. LANTUS  32 UNITS DAILY COVER- YES CO-PAY- $ 47.00 TIER- 3 DRUG PRIOR APPROVAL- NO  4. NOVOLOG  8  UNITS 3 X A DAY COVER- NONE FORMULARY PRIOR APPROVAL- YES #  [id number]  5. SENSITIVE SLIDING SCALE 3 X A DAY COVER- NONE FORMULARY PRIOR APPROVAL- YES  # [phone]  ALTERNATIVE: HUMALOG SLIDING SCALE  100 UNITS   COVER- YES CO-PAY- $ 47.00 TIER- 3 DRUG PRIOR APPROVAL- NO  DEDUCTIBLE : NOT MET  PREFERRED PHARMACY : YES [facility]

## 2018-02-02 NOTE — Progress Notes (Signed)
[place] for Coumadin/Lovenox Indication: h/o DVT and PE  Allergies  Allergen Reactions  . Cephalexin Anaphylaxis, Shortness Of Breath, Rash and Other (See Comments)    Pt was admitted to the hospital upon taking.   . Ibuprofen Nausea And Vomiting and Rash  . Meloxicam Nausea Only  . Reglan [Metoclopramide] Anxiety    Patient Measurements: Height: 5\' 4"  (162.6 cm) Weight: 165 lb (74.8 kg) IBW/kg (Calculated) : 54.7  Vital Signs: Temp: 98.7 F (37.1 C) (01/15 0707) Temp Source: Oral (01/15 0707) BP: 111/60 (01/15 0707)  Labs: Recent Labs    01/31/18 1534 02/01/18 0426 02/01/18 0449 02/02/18 0434 02/02/18 0827  HGB 13.4  --  14.2  --  12.9  HCT 41.4  --  41.7  --  38.0  PLT 318  --  275  --  301  APTT 30  --   --   --   --   LABPROT 18.2*  --  18.5* 18.4*  --   INR 1.53  --  1.56 1.55  --   CREATININE 1.17* 1.05*  --   --  0.88  TROPONINI <0.03  --   --   --   --     Estimated Creatinine Clearance: 79.9 mL/min (by C-G formula based on SCr of 0.88 mg/dL).  Assessment: 48 y.o. F [facility] with [facility]. Pt on Coumadin PTA for h/o DVT/PE. Pt stated that she was off Coumadin from Thur-Sun due to elevated INR of 6. Admission INR subtherapeutic 1.53.  INR subtherapeutic at 1.55 today. No bleeding noted, CBC is stable. Case manager is looking into DOAC.  PTA Coumadin: 5 mg/d  Goal of Therapy:  INR 2-3 Monitor platelets by anticoagulation protocol: Yes   Plan: Coumadin 7.5 mg PO tonight  Daily INR, CBC q72h Lovenox 75mg  SQ q12h   Renold Genta, PharmD, BCPS Clinical Pharmacist Clinical phone for 02/02/2018 until 3p is [phone] 02/02/2018 12:21 PM  **Pharmacist phone directory can now be found on [email or web address] listed under [place]**

## 2018-02-02 NOTE — Progress Notes (Signed)
[place] for Xarelto.  (transition from Coumadin/Lovenox to [place] Indication: h/o DVT and h/o PE  Allergies  Allergen Reactions  . Cephalexin Anaphylaxis, Shortness Of Breath, Rash and Other (See Comments)    Pt was admitted to the hospital upon taking.   . Ibuprofen Nausea And Vomiting and Rash  . Meloxicam Nausea Only  . Reglan [Metoclopramide] Anxiety    Patient Measurements: Height: 5\' 4"  (162.6 cm) Weight: 165 lb (74.8 kg) IBW/kg (Calculated) : 54.7  Vital Signs: Temp: 98.7 F (37.1 C) (01/15 0707) Temp Source: Oral (01/15 0707) BP: 96/65 (01/15 1424)  Labs: Recent Labs    01/31/18 1534 02/01/18 0426 02/01/18 0449 02/02/18 0434 02/02/18 0827  HGB 13.4  --  14.2  --  12.9  HCT 41.4  --  41.7  --  38.0  PLT 318  --  275  --  301  APTT 30  --   --   --   --   LABPROT 18.2*  --  18.5* 18.4*  --   INR 1.53  --  1.56 1.55  --   CREATININE 1.17* 1.05*  --   --  0.88  TROPONINI <0.03  --   --   --   --     Estimated Creatinine Clearance: 79.9 mL/min (by C-G formula based on SCr of 0.88 mg/dL).  Assessment: 48 y.o. F presented to [facility] on 01/31/18 with visual changes. Pt was on Coumadin PTA for h/o DVT/PE. Pt stated that she was off Coumadin from Thur-Sun due to elevated INR of 6. Admission INR subtherapeutic 1.53.  History of DVT and PE -Patient on lifelong anticoagulation with Coumadin.  Lovenox bridge started 02/01/18. INR subtherapeutic at 1.55 today. No bleeding noted, CBC is stable. -Patient wishes to be on DOAC medication and is more affordable. MD consultedCare management consulted and pharmacy consulted to  transition to Xarelto  Lovenox last given today 02/02/18 at 10:49 AM,  Coumadin last given yesterday 02/01/18 @17 :33.  Will discontinue Lovenox and coumadin. I will start Xarelto 0-2 hours of time that next lovenox dose would have been due.   Goal of Therapy:  Monitor platelets by anticoagulation protocol: Yes    Plan: Discontinue Lovenox and Coumadin now.   Xarelto 20 mg daily with supper, start 1st dose at 20:00 tonight (discussed with nurse).   Nicole Cella, RPh Clinical Pharmacist Pager: [phone] 02/02/2018  5:02 PM **Pharmacist phone directory can now be found on [email or web address] listed under [place]**

## 2018-02-02 NOTE — Care Management Note (Signed)
Case Management Note  Patient Details  Name: ROCHELLA BENNER MRN: [id number] Date of Birth: [birth year]  Subjective/Objective:    Pt in with loss of vision. She is from home with her sons.  DME: none Pt has [employer] medicare but has been told her insulin would be costly. She has concerns about affording her meds.  No PCP.                Action/Plan: CM met with the patient and she is worried her medications will be too expensive. CM did a benefits check and her blood thinner is $47/ month. Her Lantus would be $47/ month and her Humalog would be $47/month. Pt feels this affordable and she can manage these amounts.  Pt lives in [place]. She is interested in staying with [facility]. CM was able to get her an appt at [facility] [place]. Information on the AVS. Pt has transportation home. CM will provide her 30 day free card for either Eliquis or Xarelto according to her d/c meds.   Expected Discharge Date:                  Expected Discharge Plan:  Home/Self Care  In-House Referral:     Discharge planning Services  CM Consult, Other - See comment(PCP)  Post Acute Care Choice:    Choice offered to:     DME Arranged:    DME Agency:     HH Arranged:    HH Agency:     Status of Service:  Completed, signed off  If discussed at [place] of Stay Meetings, dates discussed:    Additional Comments:  Pollie Friar, RN 02/02/2018, 1:52 PM

## 2018-02-02 NOTE — Progress Notes (Addendum)
Physical Therapy Treatment Patient Details Name: Maureen [name] MRN: [id number] DOB: 14-Dec-1970 Today's Date: 02/02/2018    History of Present Illness Patient is a 48 y/o female who presents with sudden vision loss in left eye and near syncopal episode. Workup pending. PMH includes DM, PE, depression, PTSD, fibromyalgia, major depressive disorder, closed head injury, migraine.     PT Comments    Pt performed gait training with progression to stair training.  Pt remains symptomatic but able to function throughout session.  Educated on strategies to slow transitions to allow decreased dizziness with mobility.  Plan next session for flight of stair training.   Orthostatic Vitals were taken during session.   BP- Lying BP- Sitting BP- Standing at 0 minutes  123/69 106/68 98/63       Follow Up Recommendations  No PT follow up;Supervision - Intermittent     Equipment Recommendations  None recommended by PT    Recommendations for Other Services       Precautions / Restrictions Precautions Precautions: Fall Precaution Comments: blurry left eye, orthostatic watch BPs/symptoms Restrictions Weight Bearing Restrictions: No    Mobility  Bed Mobility                  Transfers Overall transfer level: Modified independent Equipment used: None                Ambulation/Gait Ambulation/Gait assistance: Min guard Gait Distance (Feet): 150 Feet Assistive device: None Gait Pattern/deviations: Step-through pattern;Decreased stride length Gait velocity: decreased   General Gait Details: Cues for reciprocal armswing and pacing.  Describes dizziness as a "swimmyheaded" feeling but does not feel like she is going to pass out.  Pt progressed gait distance.     Stairs Stairs: Yes Stairs assistance: Min guard Stair Management: Two rails;Step to pattern;Forwards Number of Stairs: 4 General stair comments: Cues for sequencing and hand placement on railing.      Wheelchair Mobility    Modified Rankin (Stroke Patients Only) Modified Rankin (Stroke Patients Only) Pre-Morbid Rankin Score: Slight disability Modified Rankin: Moderately severe disability     Balance Overall balance assessment: Mild deficits observed, not formally tested   Sitting balance-Leahy Scale: Good       Standing balance-Leahy Scale: Fair Standing balance comment: min guard for safety due to dizziness.                              Cognition Arousal/Alertness: Awake/alert Behavior During Therapy: WFL for tasks assessed/performed Overall Cognitive Status: Within Functional Limits for tasks assessed                                        Exercises      General Comments        Pertinent Vitals/Pain Pain Assessment: No/denies pain    Home Living                      Prior Function            PT Goals (current goals can now be found in the care plan section) Acute Rehab PT Goals Patient Stated Goal: to get better Potential to Achieve Goals: Good Progress towards PT goals: Progressing toward goals    Frequency    Min 3X/week      PT Plan Current plan remains appropriate  Co-evaluation              AM-PAC PT "6 Clicks" Mobility   Outcome Measure  Help needed turning from your back to your side while in a flat bed without using bedrails?: None Help needed moving from lying on your back to sitting on the side of a flat bed without using bedrails?: None Help needed moving to and from a bed to a chair (including a wheelchair)?: None Help needed standing up from a chair using your arms (e.g., wheelchair or bedside chair)?: None Help needed to walk in hospital room?: None Help needed climbing 3-5 steps with a railing? : A Little 6 Click Score: 23    End of Session Equipment Utilized During Treatment: Gait belt Activity Tolerance: Patient tolerated treatment well Patient left: in chair;with call  bell/phone within reach Nurse Communication: Mobility status;Other (comment)(orthostatic BPs. ) PT Visit Diagnosis: Unsteadiness on feet (R26.81);Muscle weakness (generalized) (M62.81);Difficulty in walking, not elsewhere classified (R26.2);Pain Pain - part of body: (HA)     Time: 3300-7622 PT Time Calculation (min) (ACUTE ONLY): 25 min  Charges:  $Gait Training: 23-37 mins                     Governor Rooks, PTA Acute Rehabilitation Services Pager [phone] Office [phone]     Cristela Blue 02/02/2018, 4:01 PM

## 2018-02-02 NOTE — Discharge Summary (Signed)
Physician Discharge Summary  Maureen [name] SWH:[id number] DOB: May 01, 1970 DOA: 01/31/2018  PCP: System, Pcp Not In  Admit date: 01/31/2018 Discharge date: 02/03/2018  Admitted From: Home Disposition: Home  Recommendations for Outpatient Follow-up:  1. Follow up with PCP in 1-2 weeks 2. Follow up with Opthamology within 1-2 weeks 3. Follow up with Endocrinology 4. Follow up with Neurology as needeed 5. Please obtain CMP/CBC, Mag, Phos in one week 6. Please follow up on the following pending results:  Home Health: No Equipment/Devices: None     Discharge Condition: Stable CODE STATUS: FULL CODE Diet recommendation: Heart Health Carb Modified Diet  Brief/Interim Summary:  HPI per Dr. Shela [name] on 02/01/2018 Maureen Ponder Williamsis a 48 y.o.femalewith medical history significant ofTIA, cataracts, glaucoma, DVT on anticoagulation, type 1 diabetes, hyperlipidemia, depression, anxiety, OSA on CPAP presenting as a transfer from [facility] ED for evaluation of acute onset vision loss. Patient states that yesterday 1/13 around 3 PM she was walking and suddenly felt dizzy and her vision started turning dark in both eyes. This lasted a few seconds and then she regained vision in her right eye but not the left eye. Denies having any chest pain, shortness of breath, or loss of consciousness. States now the vision in her left eye has returned but it is blurry. She was having a headache at the time when she experienced sudden onset vision loss. The headache is left-sided frontal/periorbital. States she has previously had 2 surgeries for glaucoma last year in July. She also has cataracts in both of her eyes. States her ophthalmologist is Dr. Nancy Fetter. Patient states she has type 1 diabetes and when her vision loss and dizziness happened she thought it was due to low blood sugar. As such, she ate and drank juice. She believes that could be contributing to her high blood sugar  checked in the ED. States she was diagnosed with type I high diabetes at the age of 21. She uses scheduledNovoLog 8 units 3 times a day alongalongwith sliding scale insulin with meals. In addition uses Lantus 28 units at bedtime. Reports compliance with insulin and did not run out of any for her diabetes medications. States she is on lifelong anticoagulation with Coumadin due to history of PE in 2016 and DVT in 2017. Per patient, her INR was 6.3 last week on Thursday and she was advised to hold Coumadin for 2 days. She then started taking the medication yesterday.  **Vision is improving but persistently remaining Orthostatic so was given multiple fluid boluses and improved. Her Blood Sugars were adequately controlled and she was deemed medically stable to D/C Home and follow up with PCP, Endocrinology, Neurology, and Opthamology in the outpatient setting.   Discharge Diagnoses:  Principal Problem:   Acute loss of vision Active Problems:   DM type 1 (diabetes mellitus, type 1) (HCC)   HLD (hyperlipidemia)   OSA (obstructive sleep apnea)   Leukocytosis   Hyperglycemia   Hyperbilirubinemia   VTE (venous thromboembolism)   Anxiety and depression   Orthostatic hypotension   Uncontrolled type 1 diabetes mellitus with hyperglycemia, with long-term current use of insulin (HCC)   Vision loss  Acute Loss of Vision -Possibly associated with hyperglycemia and orthostatic hypotension.  -MRI of the brain negative for stroke.  -Seen by ophthalmologist (Dr. Carolynn Sayers) yesterday morning,  -No acute findings.  -Had Initial Stroke Workup and ECHO done -Vision starting to improve and feels it is almost back to baseline  -Will need Outpatient follow-up  with ophthalmology.  DM type 1 (diabetes mellitus, type 1), uncontrolled with hyperglycemia(HCC) -Hemoglobin A1c was 10.4 -On Lantus 28 units at bedtime and short-acting insulin 8 units 3 times a day with meals.  -Reports her CBGs mostly in the  200s with low normal in the morning.  -Reports being adherent to her insulin but being on Coumadin she has several food restrictions. -CBGs in 600 with no anion gap on admission. This may be causing her to have impaired vision. -CBG's improved. -Will increase Lantus to 32 units starting a.m.  -Continue with sensitive NovoLog sliding scale before meals and at bedtime -Continue to Monitor CBG's; CBG's ranging from 62-181 -Ordered bedtime snack to prevent a.m. hypoglycemia. -Diabetic coordinator consulted for medication adjustment, provide outpatient resources. -Patient reports getting an insurance in near future and plans to establish care with an endocrinologist. -Will D/C on Humalog and Lantus  Orthostatic Hypotension, persistent but now improved  -Noted to have blood pressure dropped from 130/64 supine to 104/70standing, feeling dizzy as well.  -Repeated Orthostatics this AM and she was still slightly  symptomatic  -She was placed on IVF with NS at 125 mL/hr and this was stopped sometime yesterday but will resume Maintenance IVF at 75 mL/hr due to persistent orthostasis  -Bolused 2 Liters and used TED Hose and she was not Orthostatic  -ECHO Normal  -Follow up with PCP as an outpatient   Mild Transaminitis -Ultrasound abdomen unremarkable. -Hepatic Panel Normal  -LFT's trended back down to Normal now.   Obstructive sleep apnea -Continue CPAP  History of DVT and PE -On lifelong anticoagulation with Coumadin.  -Dosing per pharmacy.  -Patient wishes to be on NOAC medication and is more affordable. Care management consulted and will transition to [facility] consulted and she will be D/C'd with Xarelto and obtain medications from [facility] (hyperlipidemia) -Continue Lipitor. Has mild transaminitis which can be monitored closely but this is improved  Headache -PRN Tylenol, give 1 dose of tramadol.  History of Glaucoma and cataract -Continue  eyedrops. Needs outpatient ophthalmology follow-up.  Leukocytosis, -Improving. ? Reactive or in the setting of Dehydration -WBC went from 12.9 -> 11.0 -> 9.6 -No current S/Sx of Infection -Repeat CBC in AM  Hypokalemia -Patient's K+ was 3.9 this AM -Continue to Monitor and Replete as Necessary -Repeat CMP in AM   Overweight -Estimated body mass index is 28.32 kg/m as calculated from the following:   Height as of this encounter: 5\' 4"  (1.626 m).   Weight as of this encounter: 74.8 kg. -Weight Loss Counseling given   AKI -Improving -C/w IVF Hydration as above -BUN/Cr is now <5/0.76 and on admission was 13/1.17 -Avoid Nephrotoxic Medications if possible -Repeat CMP as an outpatient   Discharge Instructions Discharge Instructions    Call MD for:  difficulty breathing, headache or visual disturbances   Complete by:  As directed    Call MD for:  extreme fatigue   Complete by:  As directed    Call MD for:  hives   Complete by:  As directed    Call MD for:  persistant dizziness or light-headedness   Complete by:  As directed    Call MD for:  persistant nausea and vomiting   Complete by:  As directed    Call MD for:  redness, tenderness, or signs of infection (pain, swelling, redness, odor or green/yellow discharge around incision site)   Complete by:  As directed    Call MD for:  severe  uncontrolled pain   Complete by:  As directed    Call MD for:  temperature >100.4   Complete by:  As directed    Compression stockings   Complete by:  As directed    Diet - low sodium heart healthy   Complete by:  As directed    Diet Carb Modified   Complete by:  As directed    Discharge instructions   Complete by:  As directed    You were cared for by a hospitalist during your hospital stay. If you have any questions about your discharge medications or the care you received while you were in the hospital after you are discharged, you can call the unit and ask to speak with the  hospitalist on call if the hospitalist that took care of you is not available. Once you are discharged, your primary care physician will handle any further medical issues. Please note that NO REFILLS for any discharge medications will be authorized once you are discharged, as it is imperative that you return to your primary care physician (or establish a relationship with a primary care physician if you do not have one) for your aftercare needs so that they can reassess your need for medications and monitor your lab values.  Follow up with PCP within 1 week. Take all medications as prescribed. If symptoms change or worsen please return to the ED for evaluation   Increase activity slowly   Complete by:  As directed      Allergies as of 02/03/2018      Reactions   Cephalexin Anaphylaxis, Shortness Of Breath, Rash, Other (See Comments)   Pt was admitted to the hospital upon taking.   Ibuprofen Nausea And Vomiting, Rash   Meloxicam Nausea Only   Reglan [metoclopramide] Anxiety      Medication List    STOP taking these medications   losartan 25 MG tablet Commonly known as:  COZAAR   warfarin 5 MG tablet Commonly known as:  COUMADIN     TAKE these medications   atorvastatin 40 MG tablet Commonly known as:  LIPITOR Take 40 mg by mouth at bedtime.   clonazePAM 0.5 MG tablet Commonly known as:  KLONOPIN Take 0.5 mg by mouth 4 (four) times daily as needed for anxiety.   gabapentin 300 MG capsule Commonly known as:  NEURONTIN Take 600 mg by mouth 3 (three) times daily.   insulin aspart 100 UNIT/ML injection Commonly known as:  novoLOG Inject 8 Units into the skin 3 (three) times daily with meals.   insulin glargine 100 UNIT/ML injection Commonly known as:  LANTUS Inject 0.32 mLs (32 Units total) into the skin daily. What changed:    how much to take  when to take this   lidocaine 5 % Commonly known as:  LIDODERM Place 1 patch onto the skin daily as needed (pain). Remove  after 12 hours.   rivaroxaban 20 MG Tabs tablet Commonly known as:  XARELTO Take 1 tablet (20 mg total) by mouth daily with supper.   venlafaxine XR 37.5 MG 24 hr capsule Commonly known as:  EFFEXOR-XR Take 37.5 mg by mouth daily with breakfast.       Allergies  Allergen Reactions  . Cephalexin Anaphylaxis, Shortness Of Breath, Rash and Other (See Comments)    Pt was admitted to the hospital upon taking.   . Ibuprofen Nausea And Vomiting and Rash  . Meloxicam Nausea Only  . Reglan [Metoclopramide] Anxiety   Consultations:  Opthamology  Procedures/Studies: Ct Angio Head W Or Wo Contrast  Result Date: 01/31/2018 CLINICAL DATA:  Near syncopal episode. Sudden left eye vision loss. History of traumatic brain injury. EXAM: CT ANGIOGRAPHY HEAD AND NECK TECHNIQUE: Multidetector CT imaging of the head and neck was performed using the standard protocol during bolus administration of intravenous contrast. Multiplanar CT image reconstructions and MIPs were obtained to evaluate the vascular anatomy. Carotid stenosis measurements (when applicable) are obtained utilizing NASCET criteria, using the distal internal carotid diameter as the denominator. CONTRAST:  123mL ISOVUE-370 IOPAMIDOL (ISOVUE-370) INJECTION 76% COMPARISON:  Head CT 11/17/2014 FINDINGS: CT HEAD FINDINGS Brain: There is no mass, hemorrhage or extra-axial collection. The size and configuration of the ventricles and extra-axial CSF spaces are normal. There is no acute or chronic infarction. The brain parenchyma is normal. Skull: The visualized skull base, calvarium and extracranial soft tissues are normal. Sinuses/Orbits: No fluid levels or advanced mucosal thickening of the visualized paranasal sinuses. No mastoid or middle ear effusion. The orbits are normal. CTA NECK FINDINGS SKELETON: There is no bony spinal canal stenosis. No lytic or blastic lesion. OTHER NECK: Normal pharynx, larynx and major salivary glands. No cervical  lymphadenopathy. Unremarkable thyroid gland. UPPER CHEST: No pneumothorax or pleural effusion. No nodules or masses. AORTIC ARCH: There is no calcific atherosclerosis of the aortic arch. There is no aneurysm, dissection or hemodynamically significant stenosis of the visualized ascending aorta and aortic arch. Conventional 3 vessel aortic branching pattern. The visualized proximal subclavian arteries are widely patent. RIGHT CAROTID SYSTEM: --Common carotid artery: Widely patent origin without common carotid artery dissection or aneurysm. --Internal carotid artery: No dissection, occlusion or aneurysm. Mild atherosclerotic calcification at the carotid bifurcation without hemodynamically significant stenosis. --External carotid artery: No acute abnormality. LEFT CAROTID SYSTEM: --Common carotid artery: Widely patent origin without common carotid artery dissection or aneurysm. --Internal carotid artery: No dissection, occlusion or aneurysm. Mild atherosclerotic calcification at the carotid bifurcation without hemodynamically significant stenosis. --External carotid artery: No acute abnormality. VERTEBRAL ARTERIES: Codominant configuration. Both origins are normal. No dissection, occlusion or flow-limiting stenosis to the vertebrobasilar confluence. CTA HEAD FINDINGS POSTERIOR CIRCULATION: --Basilar artery: Normal. --Posterior cerebral arteries: Normal. Both originate from the basilar artery. --Superior cerebellar arteries: Normal. --Inferior cerebellar arteries: Normal anterior and posterior inferior cerebellar arteries. ANTERIOR CIRCULATION: --Intracranial internal carotid arteries: Normal. --Anterior cerebral arteries: Normal. Both A1 segments are present. Patent anterior communicating artery. --Middle cerebral arteries: Normal. --Posterior communicating arteries: Absent bilaterally. VENOUS SINUSES: As permitted by contrast timing, patent. ANATOMIC VARIANTS: None DELAYED PHASE: No parenchymal contrast enhancement.  Review of the MIP images confirms the above findings. IMPRESSION: Normal CTA of the head and neck. Electronically Signed   By: Ulyses Jarred M.D.   On: 01/31/2018 17:42   Ct Angio Neck W And/or Wo Contrast  Result Date: 01/31/2018 CLINICAL DATA:  Near syncopal episode. Sudden left eye vision loss. History of traumatic brain injury. EXAM: CT ANGIOGRAPHY HEAD AND NECK TECHNIQUE: Multidetector CT imaging of the head and neck was performed using the standard protocol during bolus administration of intravenous contrast. Multiplanar CT image reconstructions and MIPs were obtained to evaluate the vascular anatomy. Carotid stenosis measurements (when applicable) are obtained utilizing NASCET criteria, using the distal internal carotid diameter as the denominator. CONTRAST:  170mL ISOVUE-370 IOPAMIDOL (ISOVUE-370) INJECTION 76% COMPARISON:  Head CT 11/17/2014 FINDINGS: CT HEAD FINDINGS Brain: There is no mass, hemorrhage or extra-axial collection. The size and configuration of the ventricles and extra-axial CSF spaces are normal. There is no acute or  chronic infarction. The brain parenchyma is normal. Skull: The visualized skull base, calvarium and extracranial soft tissues are normal. Sinuses/Orbits: No fluid levels or advanced mucosal thickening of the visualized paranasal sinuses. No mastoid or middle ear effusion. The orbits are normal. CTA NECK FINDINGS SKELETON: There is no bony spinal canal stenosis. No lytic or blastic lesion. OTHER NECK: Normal pharynx, larynx and major salivary glands. No cervical lymphadenopathy. Unremarkable thyroid gland. UPPER CHEST: No pneumothorax or pleural effusion. No nodules or masses. AORTIC ARCH: There is no calcific atherosclerosis of the aortic arch. There is no aneurysm, dissection or hemodynamically significant stenosis of the visualized ascending aorta and aortic arch. Conventional 3 vessel aortic branching pattern. The visualized proximal subclavian arteries are widely patent.  RIGHT CAROTID SYSTEM: --Common carotid artery: Widely patent origin without common carotid artery dissection or aneurysm. --Internal carotid artery: No dissection, occlusion or aneurysm. Mild atherosclerotic calcification at the carotid bifurcation without hemodynamically significant stenosis. --External carotid artery: No acute abnormality. LEFT CAROTID SYSTEM: --Common carotid artery: Widely patent origin without common carotid artery dissection or aneurysm. --Internal carotid artery: No dissection, occlusion or aneurysm. Mild atherosclerotic calcification at the carotid bifurcation without hemodynamically significant stenosis. --External carotid artery: No acute abnormality. VERTEBRAL ARTERIES: Codominant configuration. Both origins are normal. No dissection, occlusion or flow-limiting stenosis to the vertebrobasilar confluence. CTA HEAD FINDINGS POSTERIOR CIRCULATION: --Basilar artery: Normal. --Posterior cerebral arteries: Normal. Both originate from the basilar artery. --Superior cerebellar arteries: Normal. --Inferior cerebellar arteries: Normal anterior and posterior inferior cerebellar arteries. ANTERIOR CIRCULATION: --Intracranial internal carotid arteries: Normal. --Anterior cerebral arteries: Normal. Both A1 segments are present. Patent anterior communicating artery. --Middle cerebral arteries: Normal. --Posterior communicating arteries: Absent bilaterally. VENOUS SINUSES: As permitted by contrast timing, patent. ANATOMIC VARIANTS: None DELAYED PHASE: No parenchymal contrast enhancement. Review of the MIP images confirms the above findings. IMPRESSION: Normal CTA of the head and neck. Electronically Signed   By: Ulyses Jarred M.D.   On: 01/31/2018 17:42   Mr Brain Wo Contrast  Result Date: 02/01/2018 CLINICAL DATA:  Sudden onset vision loss in the left eye EXAM: MRI HEAD WITHOUT CONTRAST TECHNIQUE: Multiplanar, multiecho pulse sequences of the brain and surrounding structures were obtained without  intravenous contrast. COMPARISON:  Head CT from yesterday.  Brain MRI 11/19/2015 FINDINGS: Brain: No acute infarction, hemorrhage, hydrocephalus, extra-axial collection or mass lesion. No evidence of demyelination. Clear suprasellar cistern. Vascular: Major flow voids are preserved Skull and upper cervical spine: Negative for marrow lesion Sinuses/Orbits: Negative. No gross orbital inflammation, mass, or intra-ocular collection. IMPRESSION: Negative brain MRI. Electronically Signed   By: Monte Fantasia M.D.   On: 02/01/2018 11:54   US Abdomen Limited Ruq  Result Date: 02/01/2018 CLINICAL DATA:  Elevated total bilirubin.  Cholecystectomy. EXAM: ULTRASOUND ABDOMEN LIMITED RIGHT UPPER QUADRANT COMPARISON:  CT scan of the abdomen and pelvis dated 01/10/2017 FINDINGS: Gallbladder: Removed. Common bile duct: Diameter: 4.8 mm, normal. Liver: No focal lesion identified. Within normal limits in parenchymal echogenicity. Portal vein is patent on color Doppler imaging with normal direction of blood flow towards the liver. IMPRESSION: Normal exam. The gallbladder has been removed. No dilated bile ducts. Electronically Signed   By: Lorriane Shire M.D.   On: 02/01/2018 12:16   ECHOCARDIOGRAM ------------------------------------------------------------------- Study Conclusions  - Left ventricle: The cavity size was normal. Systolic function was   normal. The estimated ejection fraction was in the range of 55%   to 60%. Wall motion was normal; there were no regional wall   motion abnormalities. Left  ventricular diastolic function   parameters were normal. - Atrial septum: No defect or patent foramen ovale was identified.  Subjective: Examined this morning and she was doing better and felt slightly dizzy.  After she was given IV for boluses she felt well and was no longer orthostatic.  She was seen medically stable to be discharged she will need to follow-up with PCP as well as endocrinology, ophthalmology, and  neurology in the outpatient setting.  Discharge Exam: Vitals:   02/03/18 0749 02/03/18 1245  BP:    Pulse:    Resp: 17 18  Temp: 98 F (36.7 C) 98.2 F (36.8 C)  SpO2: 100% 100%   Vitals:   02/03/18 0000 02/03/18 0355 02/03/18 0749 02/03/18 1245  BP:  117/63    Pulse:      Resp:  18 17 18   Temp: 97.6 F (36.4 C) 97.8 F (36.6 C) 98 F (36.7 C) 98.2 F (36.8 C)  TempSrc: Oral Oral Oral Oral  SpO2:   100% 100%  Weight:      Height:       General: Pt is alert, awake, not in acute distress Cardiovascular: RRR, S1/S2 +, no rubs, no gallops Respiratory: Diminished bilaterally, no wheezing, no rhonchi Abdominal: Soft, NT, Distended slightly 2/2 body habitus, bowel sounds + Extremities: no LE edema, no cyanosis  The results of significant diagnostics from this hospitalization (including imaging, microbiology, ancillary and laboratory) are listed below for reference.    Microbiology: No results found for this or any previous visit (from the past 240 hour(s)).   Labs: BNP (last 3 results) Recent Labs    11/17/17 0040  BNP 41.6   Basic Metabolic Panel: Recent Labs  Lab 01/31/18 1534 02/01/18 0426 02/02/18 0827 02/03/18 0545  NA 130* 134* 140 141  K 4.2 3.7 3.3* 3.9  CL 92* 95* 104 108  CO2 25 26 28 25   GLUCOSE 664* 353* 146* 156*  BUN 13 9 10  <5*  CREATININE 1.17* 1.05* 0.88 0.76  CALCIUM 9.4 9.3 8.8* 9.0  MG  --   --  1.8 1.8  PHOS  --   --  3.3 3.1   Liver Function Tests: Recent Labs  Lab 01/31/18 1534 02/02/18 0434 02/03/18 0545  AST 28 20 25   ALT 50* 33 31  ALKPHOS 112 83 82  BILITOT 1.5* 0.8 0.6  PROT 7.7 6.6 6.4*  ALBUMIN 3.8 3.0* 2.9*   No results for input(s): LIPASE, AMYLASE in the last 168 hours. No results for input(s): AMMONIA in the last 168 hours. CBC: Recent Labs  Lab 01/31/18 1534 02/01/18 0449 02/02/18 0827 02/03/18 0545  WBC 13.7* 12.9* 11.0* 9.6  NEUTROABS 10.7*  --  7.3 6.1  HGB 13.4 14.2 12.9 12.7  HCT 41.4 41.7 38.0  38.3  MCV 95.0 93.3 94.1 92.1  PLT 318 275 301 290   Cardiac Enzymes: Recent Labs  Lab 01/31/18 1534  TROPONINI <0.03   BNP: Invalid input(s): POCBNP CBG: Recent Labs  Lab 02/02/18 1656 02/02/18 2200 02/03/18 0142 02/03/18 0659 02/03/18 1114  GLUCAP 117* 62* 111* 140* 181*   D-Dimer No results for input(s): DDIMER in the last 72 hours. Hgb A1c Recent Labs    02/01/18 0426  HGBA1C 10.4*   Lipid Profile Recent Labs    02/01/18 0426  CHOL 146  HDL 49  LDLCALC 80  TRIG 87  CHOLHDL 3.0   Thyroid function studies No results for input(s): TSH, T4TOTAL, T3FREE, THYROIDAB in the last 72 hours.  Invalid input(s): FREET3 Anemia work up No results for input(s): VITAMINB12, FOLATE, FERRITIN, TIBC, IRON, RETICCTPCT in the last 72 hours. Urinalysis    Component Value Date/Time   COLORURINE YELLOW 01/31/2018 1534   APPEARANCEUR CLEAR 01/31/2018 1534   LABSPEC <1.005 (L) 01/31/2018 1534   PHURINE 5.5 01/31/2018 1534   GLUCOSEU >=500 (A) 01/31/2018 1534   HGBUR NEGATIVE 01/31/2018 1534   BILIRUBINUR NEGATIVE 01/31/2018 1534   BILIRUBINUR negative 01/17/2015 1357   BILIRUBINUR small 07/18/2013 1817   KETONESUR 15 (A) 01/31/2018 1534   PROTEINUR NEGATIVE 01/31/2018 1534   UROBILINOGEN 1.0 01/17/2015 1357   UROBILINOGEN 0.2 10/25/2014 0927   NITRITE NEGATIVE 01/31/2018 1534   LEUKOCYTESUR NEGATIVE 01/31/2018 1534   Sepsis Labs Invalid input(s): PROCALCITONIN,  WBC,  LACTICIDVEN Microbiology No results found for this or any previous visit (from the past 240 hour(s)).  Time coordinating discharge: 35 minutes  SIGNED:  Kerney Elbe, DO [facility] 02/03/2018, 2:03 PM Pager is on [place]  If 7PM-7AM, please contact night-coverage [email or web address] Password [email or web address]

## 2018-02-02 NOTE — Plan of Care (Signed)
  Problem: Health Behavior/Discharge Planning: Goal: Ability to manage health-related needs will improve Outcome: Progressing   Problem: Clinical Measurements: Goal: Ability to maintain clinical measurements within normal limits will improve Outcome: Progressing   

## 2018-02-03 ENCOUNTER — Encounter (HOSPITAL_COMMUNITY): Payer: Self-pay | Admitting: Pharmacist Clinician (PhC)/ Clinical Pharmacy Specialist

## 2018-02-03 DIAGNOSIS — E785 Hyperlipidemia, unspecified: Secondary | ICD-10-CM

## 2018-02-03 LAB — COMPREHENSIVE METABOLIC PANEL
ALT: 31 U/L (ref 0–44)
AST: 25 U/L (ref 15–41)
Albumin: 2.9 g/dL — ABNORMAL LOW (ref 3.5–5.0)
Alkaline Phosphatase: 82 U/L (ref 38–126)
Anion gap: 8 (ref 5–15)
BUN: <5 mg/dL — ABNORMAL LOW (ref 6–20)
CO2: 25 mmol/L (ref 22–32)
Calcium: 9 mg/dL (ref 8.9–10.3)
Chloride: 108 mmol/L (ref 98–111)
Creatinine, Ser: 0.76 mg/dL (ref 0.44–1.00)
GFR calc Af Amer: >60 mL/min (ref >60)
GFR calc non Af Amer: >60 mL/min (ref >60)
Glucose, Bld: 156 mg/dL — ABNORMAL HIGH (ref 70–99)
Potassium: 3.9 mmol/L (ref 3.5–5.1)
Sodium: 141 mmol/L (ref 135–145)
Total Bilirubin: 0.6 mg/dL (ref 0.3–1.2)
Total Protein: 6.4 g/dL — ABNORMAL LOW (ref 6.5–8.1)

## 2018-02-03 LAB — CBC WITH DIFFERENTIAL/PLATELET
Abs Immature Granulocytes: 0.03 10*3/uL (ref 0.00–0.07)
Basophils Absolute: 0.1 10*3/uL (ref 0.0–0.1)
Basophils Relative: 1 %
Eosinophils Absolute: 0.2 10*3/uL (ref 0.0–0.5)
Eosinophils Relative: 2 %
HCT: 38.3 % (ref 36.0–46.0)
Hemoglobin: 12.7 g/dL (ref 12.0–15.0)
Immature Granulocytes: 0 %
Lymphocytes Relative: 27 %
Lymphs Abs: 2.6 10*3/uL (ref 0.7–4.0)
MCH: 30.5 pg (ref 26.0–34.0)
MCHC: 33.2 g/dL (ref 30.0–36.0)
MCV: 92.1 fL (ref 80.0–100.0)
Monocytes Absolute: 0.6 10*3/uL (ref 0.1–1.0)
Monocytes Relative: 7 %
Neutro Abs: 6.1 10*3/uL (ref 1.7–7.7)
Neutrophils Relative %: 63 %
Platelets: 290 10*3/uL (ref 150–400)
RBC: 4.16 MIL/uL (ref 3.87–5.11)
RDW: 12.1 % (ref 11.5–15.5)
WBC: 9.6 10*3/uL (ref 4.0–10.5)
nRBC: 0 % (ref 0.0–0.2)

## 2018-02-03 LAB — PROTIME-INR
INR: 3.36
Prothrombin Time: 33.5 seconds — ABNORMAL HIGH (ref 11.4–15.2)

## 2018-02-03 LAB — GLUCOSE, CAPILLARY
Glucose-Capillary: 111 mg/dL — ABNORMAL HIGH (ref 70–99)
Glucose-Capillary: 140 mg/dL — ABNORMAL HIGH (ref 70–99)
Glucose-Capillary: 181 mg/dL — ABNORMAL HIGH (ref 70–99)

## 2018-02-03 LAB — PHOSPHORUS: Phosphorus: 3.1 mg/dL (ref 2.5–4.6)

## 2018-02-03 LAB — MAGNESIUM: Magnesium: 1.8 mg/dL (ref 1.7–2.4)

## 2018-02-03 MED ORDER — INSULIN LISPRO 100 UNIT/ML ~~LOC~~ SOLN: 8.0000 [IU] | Freq: Three times a day (TID) | SUBCUTANEOUS | 0 refills | Status: DC

## 2018-02-03 MED ORDER — SODIUM CHLORIDE 0.9 % IV BOLUS
1000.0000 mL | Freq: Once | INTRAVENOUS | Status: AC
  Administered 2018-02-03: 1000 mL via INTRAVENOUS

## 2018-02-03 MED ORDER — RIVAROXABAN 20 MG PO TABS: 20.0000 mg | ORAL_TABLET | Freq: Every day | ORAL | 0 refills | Status: DC

## 2018-02-03 MED FILL — XARELTO 20 MG TABLET: 20 | 30 days supply | Qty: 30 | Fill #0

## 2018-02-03 NOTE — Plan of Care (Signed)
Adequate for discharge.

## 2018-02-03 NOTE — Care Management Note (Signed)
Case Management Note  Patient Details  Name: Maureen [name] MRN: [id number] Date of Birth: 1970/04/21  Subjective/Objective:                    Action/Plan: CM provided her 30 day free Xarelto card. Pt has transportation home.  Expected Discharge Date:  02/03/18               Expected Discharge Plan:  Home/Self Care  In-House Referral:     Discharge planning Services  CM Consult, Other - See comment(PCP)  Post Acute Care Choice:    Choice offered to:     DME Arranged:    DME Agency:     HH Arranged:    HH Agency:     Status of Service:  Completed, signed off  If discussed at [employer] of Stay Meetings, dates discussed:    Additional Comments:  Pollie Friar, RN 02/03/2018, 2:30 PM

## 2018-02-03 NOTE — Discharge Instructions (Addendum)
Information on my medicine - XARELTO (rivaroxaban)  This medication education was reviewed with me or my healthcare representative as part of my discharge preparation.    WHY WAS XARELTO PRESCRIBED FOR YOU? Xarelto was prescribed to treat blood clots that may have been found in the veins of your legs (deep vein thrombosis) or in your lungs (pulmonary embolism) and to reduce the risk of them occurring again. -- for past medical history of deep vein thrombosis and pulmonary embolism.  What do you need to know about Xarelto The dose is one 20 mg tablet taken ONCE A DAY with your evening meal.  DO NOT stop taking Xarelto without talking to the health care provider who prescribed the medication.  Refill your prescription for 20 mg tablets before you run out.  After discharge, you should have regular check-up appointments with your healthcare provider that is prescribing your Xarelto.  In the future your dose may need to be changed if your kidney function changes by a significant amount.  What do you do if you miss a dose? If you are taking Xarelto TWICE DAILY and you miss a dose, take it as soon as you remember. You may take two 15 mg tablets (total 30 mg) at the same time then resume your regularly scheduled 15 mg twice daily the next day.  If you are taking Xarelto ONCE DAILY and you miss a dose, take it as soon as you remember on the same day then continue your regularly scheduled once daily regimen the next day. Do not take two doses of Xarelto at the same time.   Important Safety Information Xarelto is a blood thinner medicine that can cause bleeding. You should call your healthcare provider right away if you experience any of the following: ? Bleeding from an injury or your nose that does not stop. ? Unusual colored urine (red or dark brown) or unusual colored stools (red or black). ? Unusual bruising for unknown reasons. ? A serious fall or if you hit your head (even if there is  no bleeding).  Some medicines may interact with Xarelto and might increase your risk of bleeding while on Xarelto. To help avoid this, consult your healthcare provider or pharmacist prior to using any new prescription or non-prescription medications, including herbals, vitamins, non-steroidal anti-inflammatory drugs (NSAIDs) and supplements.  This website has more information on Xarelto: [email or web address]

## 2018-02-03 NOTE — Progress Notes (Signed)
Physical Therapy Treatment Patient Details Name: Maureen [name] MRN: [id number] DOB: 09/16/1970 Today's Date: 02/03/2018    History of Present Illness Patient is a 48 y/o female who presents with sudden vision loss in left eye and near syncopal episode. Workup pending. PMH includes DM, PE, depression, PTSD, fibromyalgia, major depressive disorder, closed head injury, migraine.     PT Comments    Pt performed gait training and progression to stair training to complete 3 flights of stairs before d/c home.  Pt initially required supervision but able to perform reciprocally with modified independence as trial progressed.  Discussed strategies to pace and slow mobility when feeling dizzy.  Pt denies symptoms today.  Plan for d/c home today.     Follow Up Recommendations  No PT follow up;Supervision - Intermittent     Equipment Recommendations  None recommended by PT    Recommendations for Other Services       Precautions / Restrictions Precautions Precautions: Fall Precaution Comments: blurry left eye, orthostatic watch BPs/symptoms Restrictions Weight Bearing Restrictions: No    Mobility  Bed Mobility               General bed mobility comments: Pt standing  in room on arrival.    Transfers                 General transfer comment: Pt standing in room on arrival.. Denies dizziness in standing and throughout session.    Ambulation/Gait Ambulation/Gait assistance: Independent Gait Distance (Feet): 200 Feet Assistive device: None Gait Pattern/deviations: WFL(Within Functional Limits);Step-through pattern     General Gait Details: Normal gait within functional limits   Stairs Stairs: Yes Stairs assistance: Supervision(supervision initially then progressed to modified Independent.  ) Stair Management: One rail Right Number of Stairs: (3 flights of stairs to simulate entry in 3rd floor apartment.  )     Wheelchair Mobility    Modified Rankin (Stroke  Patients Only) Modified Rankin (Stroke Patients Only) Pre-Morbid Rankin Score: Slight disability Modified Rankin: Slight disability     Balance Overall balance assessment: Mild deficits observed, not formally tested Sitting-balance support: Feet supported;No upper extremity supported Sitting balance-Leahy Scale: Normal       Standing balance-Leahy Scale: Normal Standing balance comment: no dizziness                            Cognition Arousal/Alertness: Awake/alert Behavior During Therapy: WFL for tasks assessed/performed Overall Cognitive Status: Within Functional Limits for tasks assessed                                        Exercises      General Comments        Pertinent Vitals/Pain Pain Assessment: No/denies pain    Home Living                      Prior Function            PT Goals (current goals can now be found in the care plan section) Acute Rehab PT Goals Patient Stated Goal: to get better Potential to Achieve Goals: Good Progress towards PT goals: Progressing toward goals    Frequency    Min 3X/week      PT Plan Current plan remains appropriate    Co-evaluation  AM-PAC PT "6 Clicks" Mobility   Outcome Measure  Help needed turning from your back to your side while in a flat bed without using bedrails?: None Help needed moving from lying on your back to sitting on the side of a flat bed without using bedrails?: None Help needed moving to and from a bed to a chair (including a wheelchair)?: None Help needed standing up from a chair using your arms (e.g., wheelchair or bedside chair)?: None Help needed to walk in hospital room?: None Help needed climbing 3-5 steps with a railing? : A Little 6 Click Score: 23    End of Session Equipment Utilized During Treatment: Gait belt Activity Tolerance: Patient tolerated treatment well Patient left: in chair;with call bell/phone within  reach Nurse Communication: Mobility status PT Visit Diagnosis: Unsteadiness on feet (R26.81);Muscle weakness (generalized) (M62.81);Difficulty in walking, not elsewhere classified (R26.2);Pain Pain - part of body: (HA)     Time: 9390-3009 PT Time Calculation (min) (ACUTE ONLY): 13 min  Charges:  $Gait Training: 8-22 mins                     Governor Rooks, PTA Acute Rehabilitation Services Pager [phone] Office [phone]     Pratt Bress Eli Hose 02/03/2018, 4:06 PM

## 2018-02-03 NOTE — Plan of Care (Signed)
  Problem: Clinical Measurements: Goal: Ability to maintain clinical measurements within normal limits will improve Outcome: Progressing   Problem: Health Behavior/Discharge Planning: Goal: Ability to manage health-related needs will improve Outcome: Progressing   Problem: Education: Goal: Knowledge of General Education information will improve Description Including pain rating scale, medication(s)/side effects and non-pharmacologic comfort measures Outcome: Progressing   Problem: Clinical Measurements: Goal: Respiratory complications will improve Outcome: Progressing

## 2018-02-13 IMAGING — DX DG CHEST 2V
2 series · 2 of 2 positions shown · non-contrast
Comparison: Radiographs and CT 03/02/2016, additional priors.

CLINICAL DATA: Central chest pain and pressure. Shortness of
breath. Symptom onset today. History of pulmonary embolus.

EXAM:
CHEST  2 VIEW

[chest pa]
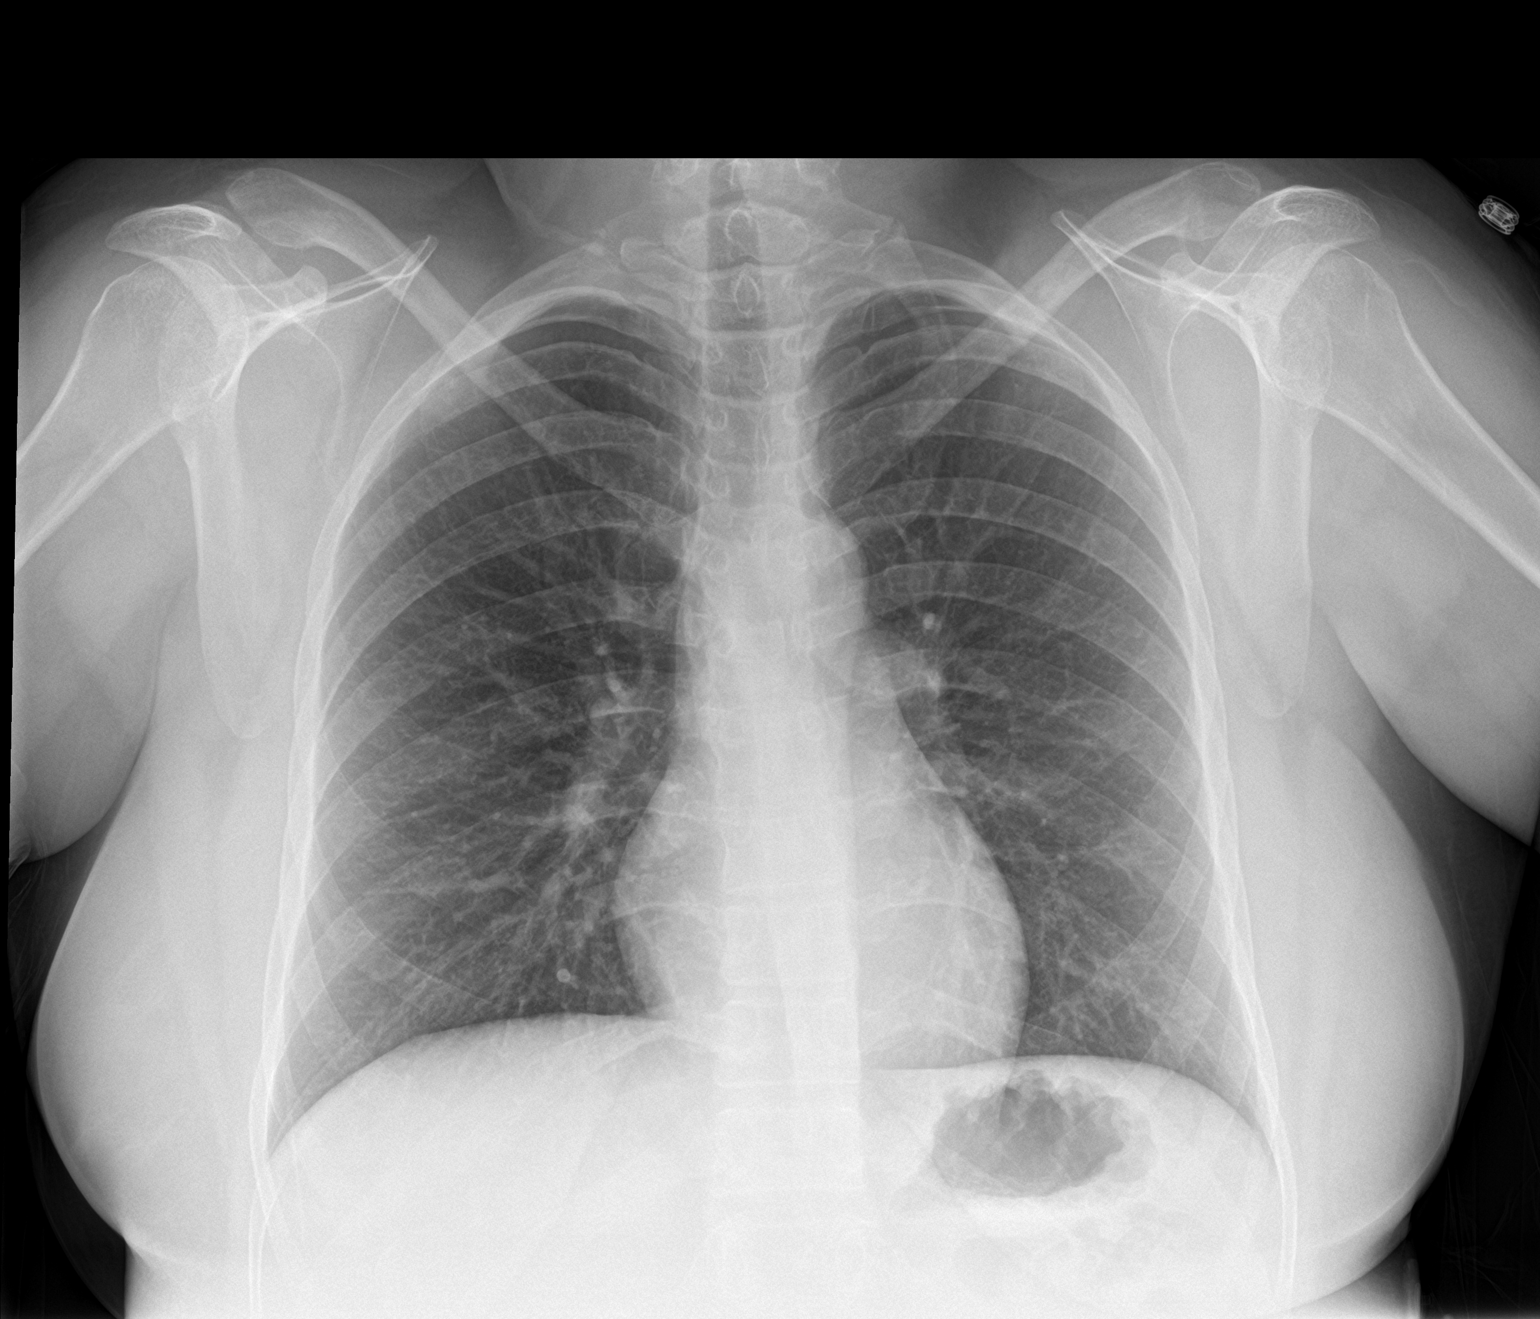

[chest lat]
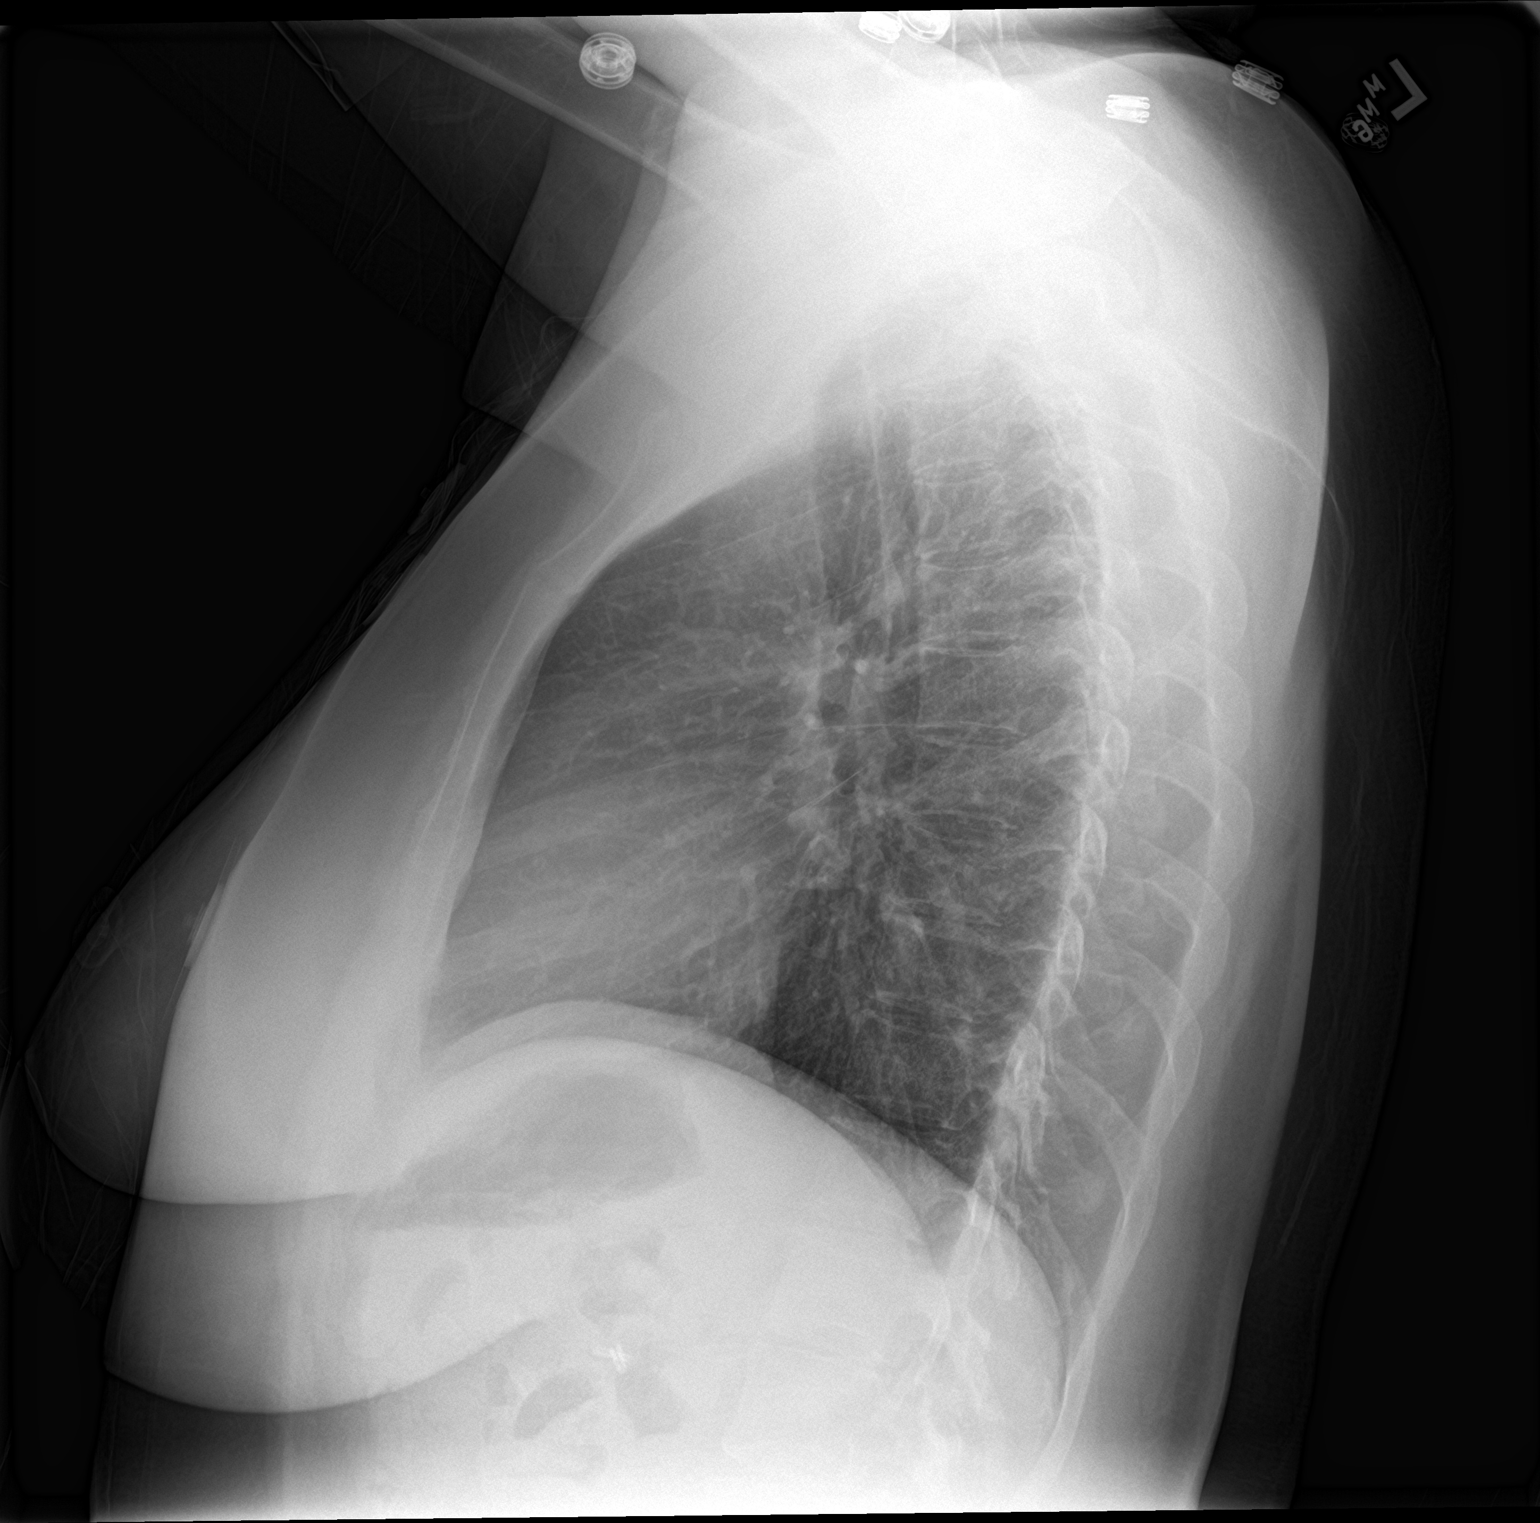

[2 of 2 positions shown; findings below may reference images not displayed]

FINDINGS: The cardiomediastinal contours are normal. The lungs are clear.
Pulmonary vasculature is normal. No consolidation, pleural effusion,
or pneumothorax. No acute osseous abnormalities are seen. Instance
note of left cervical rib.
IMPRESSION: No acute pulmonary process.

## 2018-02-13 IMAGING — CT CT ANGIO CHEST
2 of 9 series · 18 of 46 positions shown · IV contrast (OMNI)
Comparison: Chest radiograph performed earlier today at [DATE] p.m.,
and CTA of the chest performed 03/02/2016

CLINICAL DATA: Acute onset of shortness of breath and dizziness.
Generalized chest pain. Initial encounter.

EXAM:
CT ANGIOGRAPHY CHEST WITH CONTRAST
TECHNIQUE: Multidetector CT imaging of the chest was performed using the
standard protocol during bolus administration of intravenous
contrast. Multiplanar CT image reconstructions and MIPs were
obtained to evaluate the vascular anatomy.
CONTRAST:  80 mL of Isovue 370 IV contrast

[Series 6: thins · axial · 0.55mm/px · z∈[-317,-78]mm · 15 of 269 slices shown]
[im 15/269  lung]
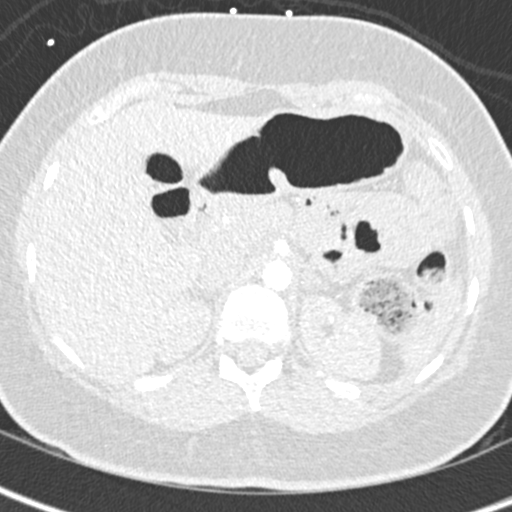
[im 30/269  soft-tissue]
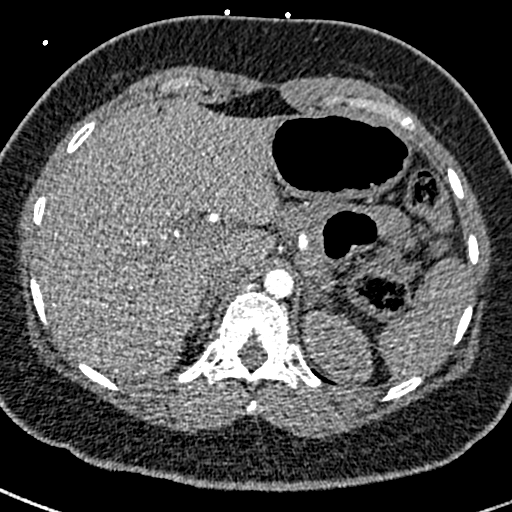
[im 45/269  lung]
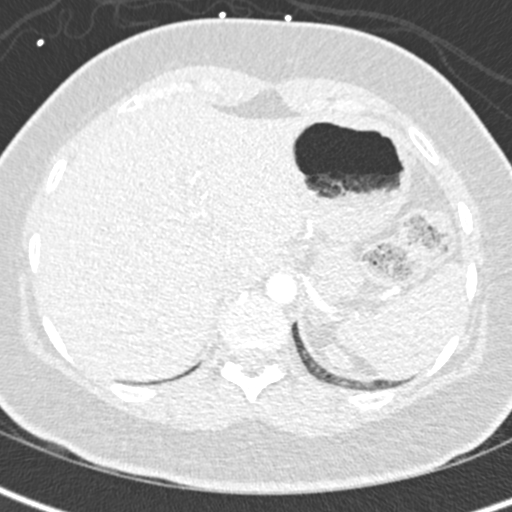
[im 60/269  soft-tissue]
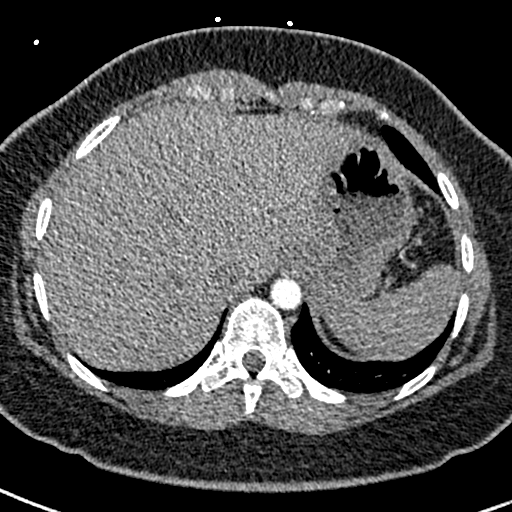
[im 90/269  lung]
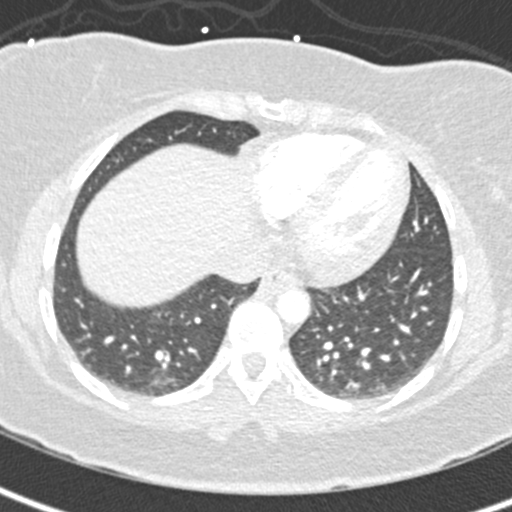
[im 105/269  soft-tissue]
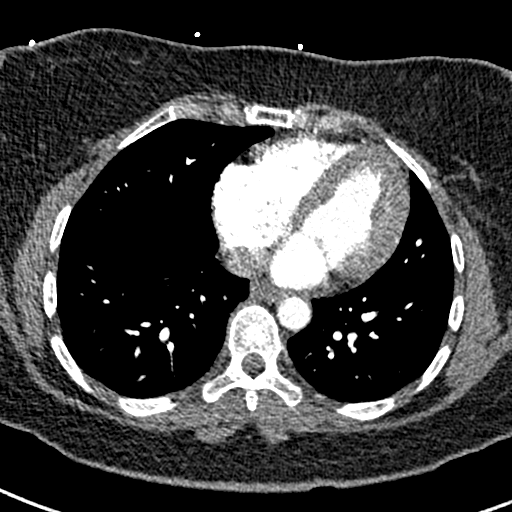
[im 120/269  lung]
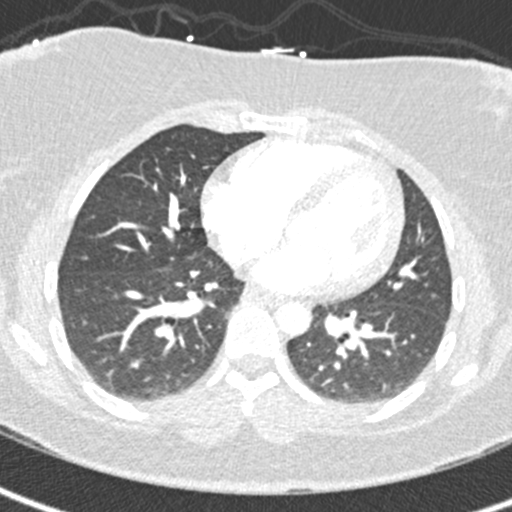
[im 135/269  soft-tissue]
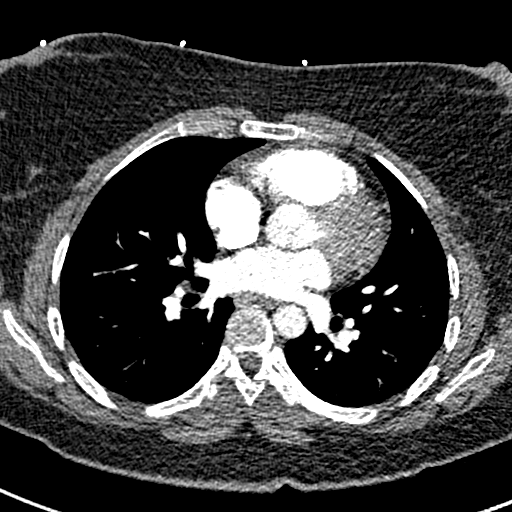
[im 149/269  lung]
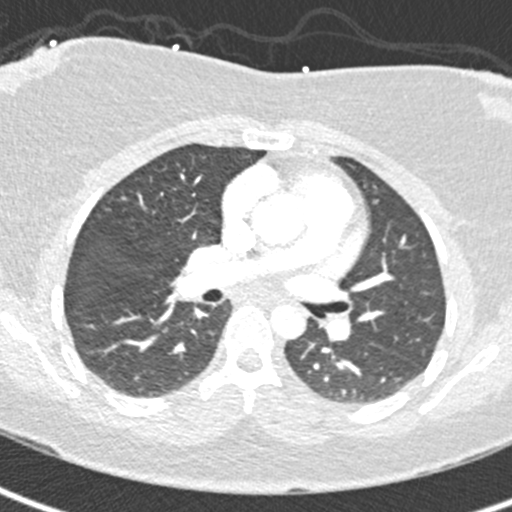
[im 164/269  soft-tissue]
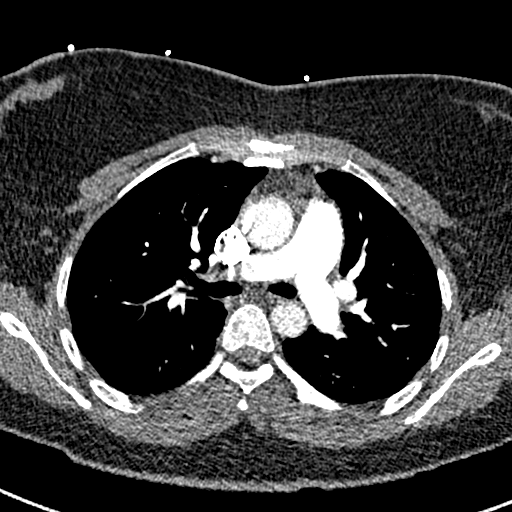
[im 179/269  lung]
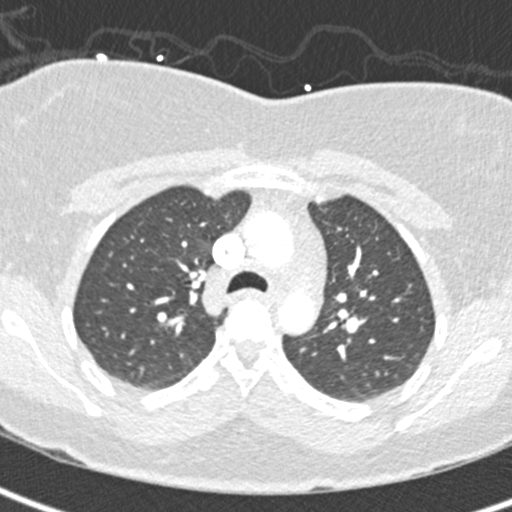
[im 209/269  soft-tissue]
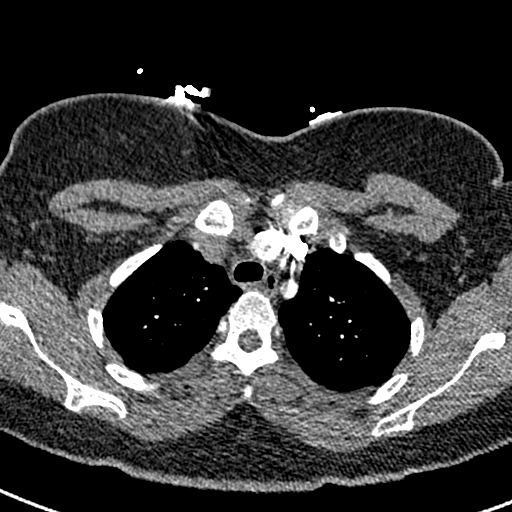
[im 224/269  lung]
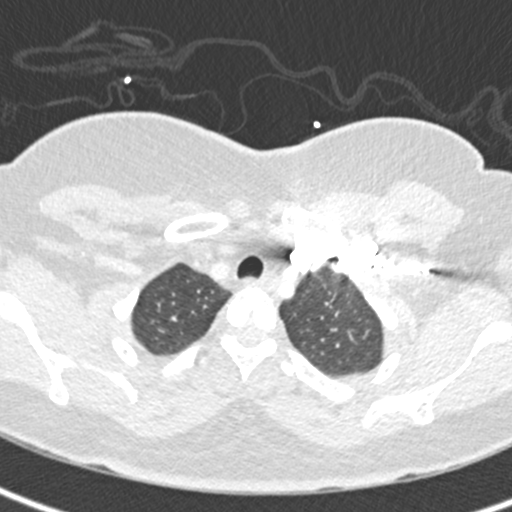
[im 239/269  soft-tissue]
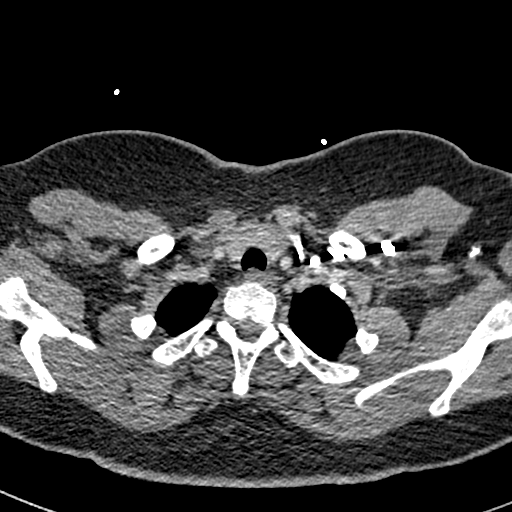
[im 254/269  lung]
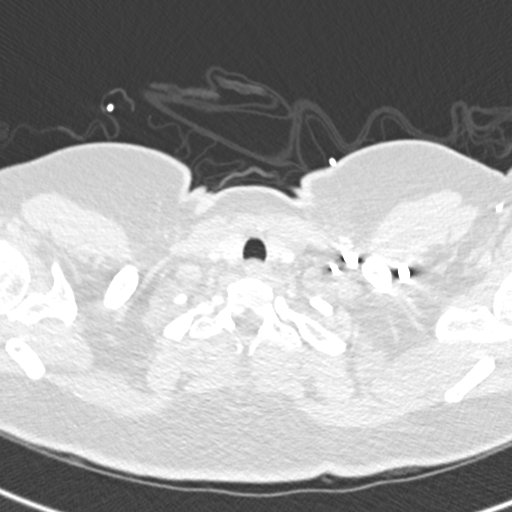

[Series 8: coronal mpr · coronal · 0.50mm/px · 3 of 118 slices shown]
[im 30/118  soft-tissue]
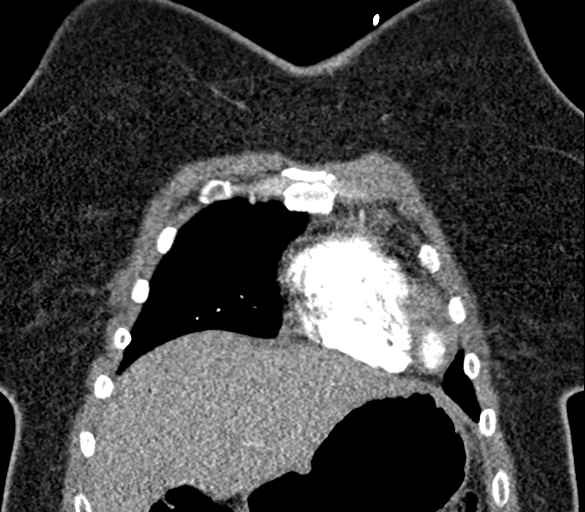
[im 59/118  soft-tissue]
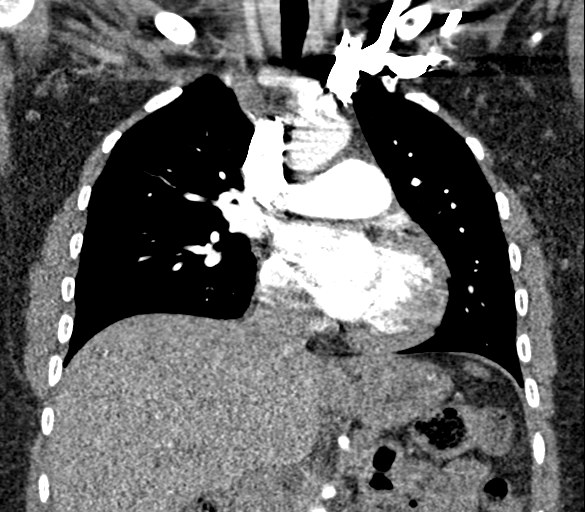
[im 88/118  soft-tissue]
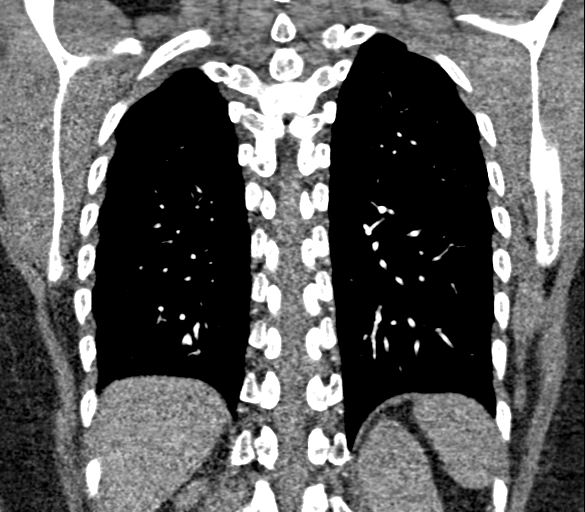

[18 of 46 positions shown; findings below may reference images not displayed]

FINDINGS: Cardiovascular:  There is no evidence of pulmonary embolus.

The heart is normal in size. The thoracic aorta is unremarkable in
appearance. The great vessels are grossly unremarkable.

Mediastinum/Nodes: The mediastinum is unremarkable in appearance. No
mediastinal lymphadenopathy is seen. No pericardial effusion is
identified. The thyroid gland is unremarkable. No axillary
lymphadenopathy is appreciated.

Lungs/Pleura: The lungs are clear bilaterally. No focal
consolidation, pleural effusion or pneumothorax is seen. No masses
are seen.

Upper Abdomen: The visualized portions of the liver and the spleen
are unremarkable in appearance. The patient is status post
cholecystectomy, with clips noted at the gallbladder fossa. The
visualized portions of the pancreas, adrenal glands and kidneys are
within normal limits.

Musculoskeletal: No acute osseous abnormalities are identified. The
visualized musculature is unremarkable in appearance.

Review of the MIP images confirms the above findings.
IMPRESSION: 1. No evidence of pulmonary embolus.
2. Lungs clear bilaterally.

## 2018-02-23 ENCOUNTER — Ambulatory Visit: Payer: Self-pay | Admitting: Family Medicine

## 2018-03-11 ENCOUNTER — Ambulatory Visit: Payer: Self-pay | Admitting: Family Medicine

## 2018-03-14 IMAGING — CR DG CHEST 2V
2 series · 2 of 2 positions shown · non-contrast
Comparison: 04/12/2016 CT chest.  04/12/2016 chest radiograph.

CLINICAL DATA: 45 y/o F; chest pain, shortness of breath,
hyperglycemia.

EXAM:
CHEST  2 VIEW

[w chest pa]
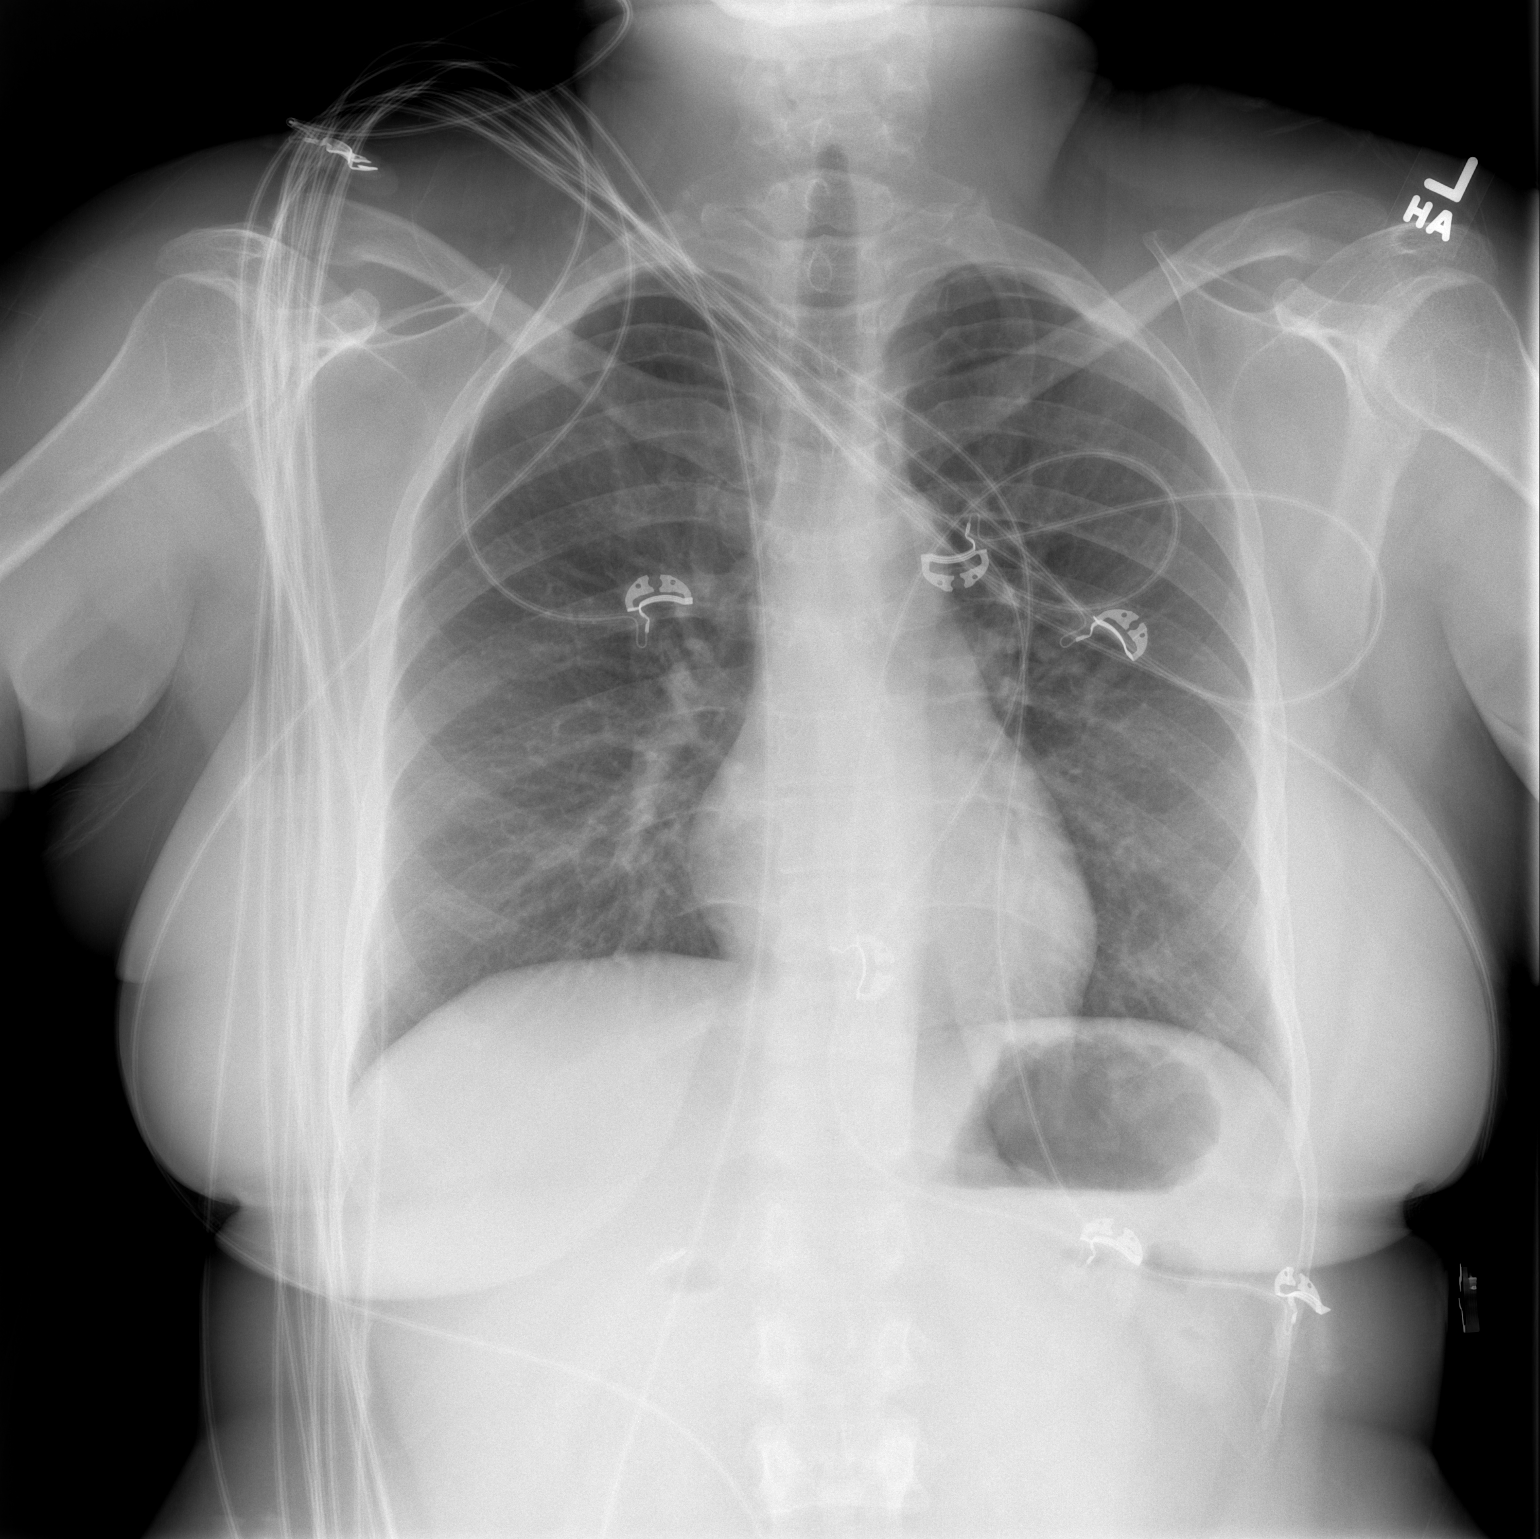

[w chest lat]
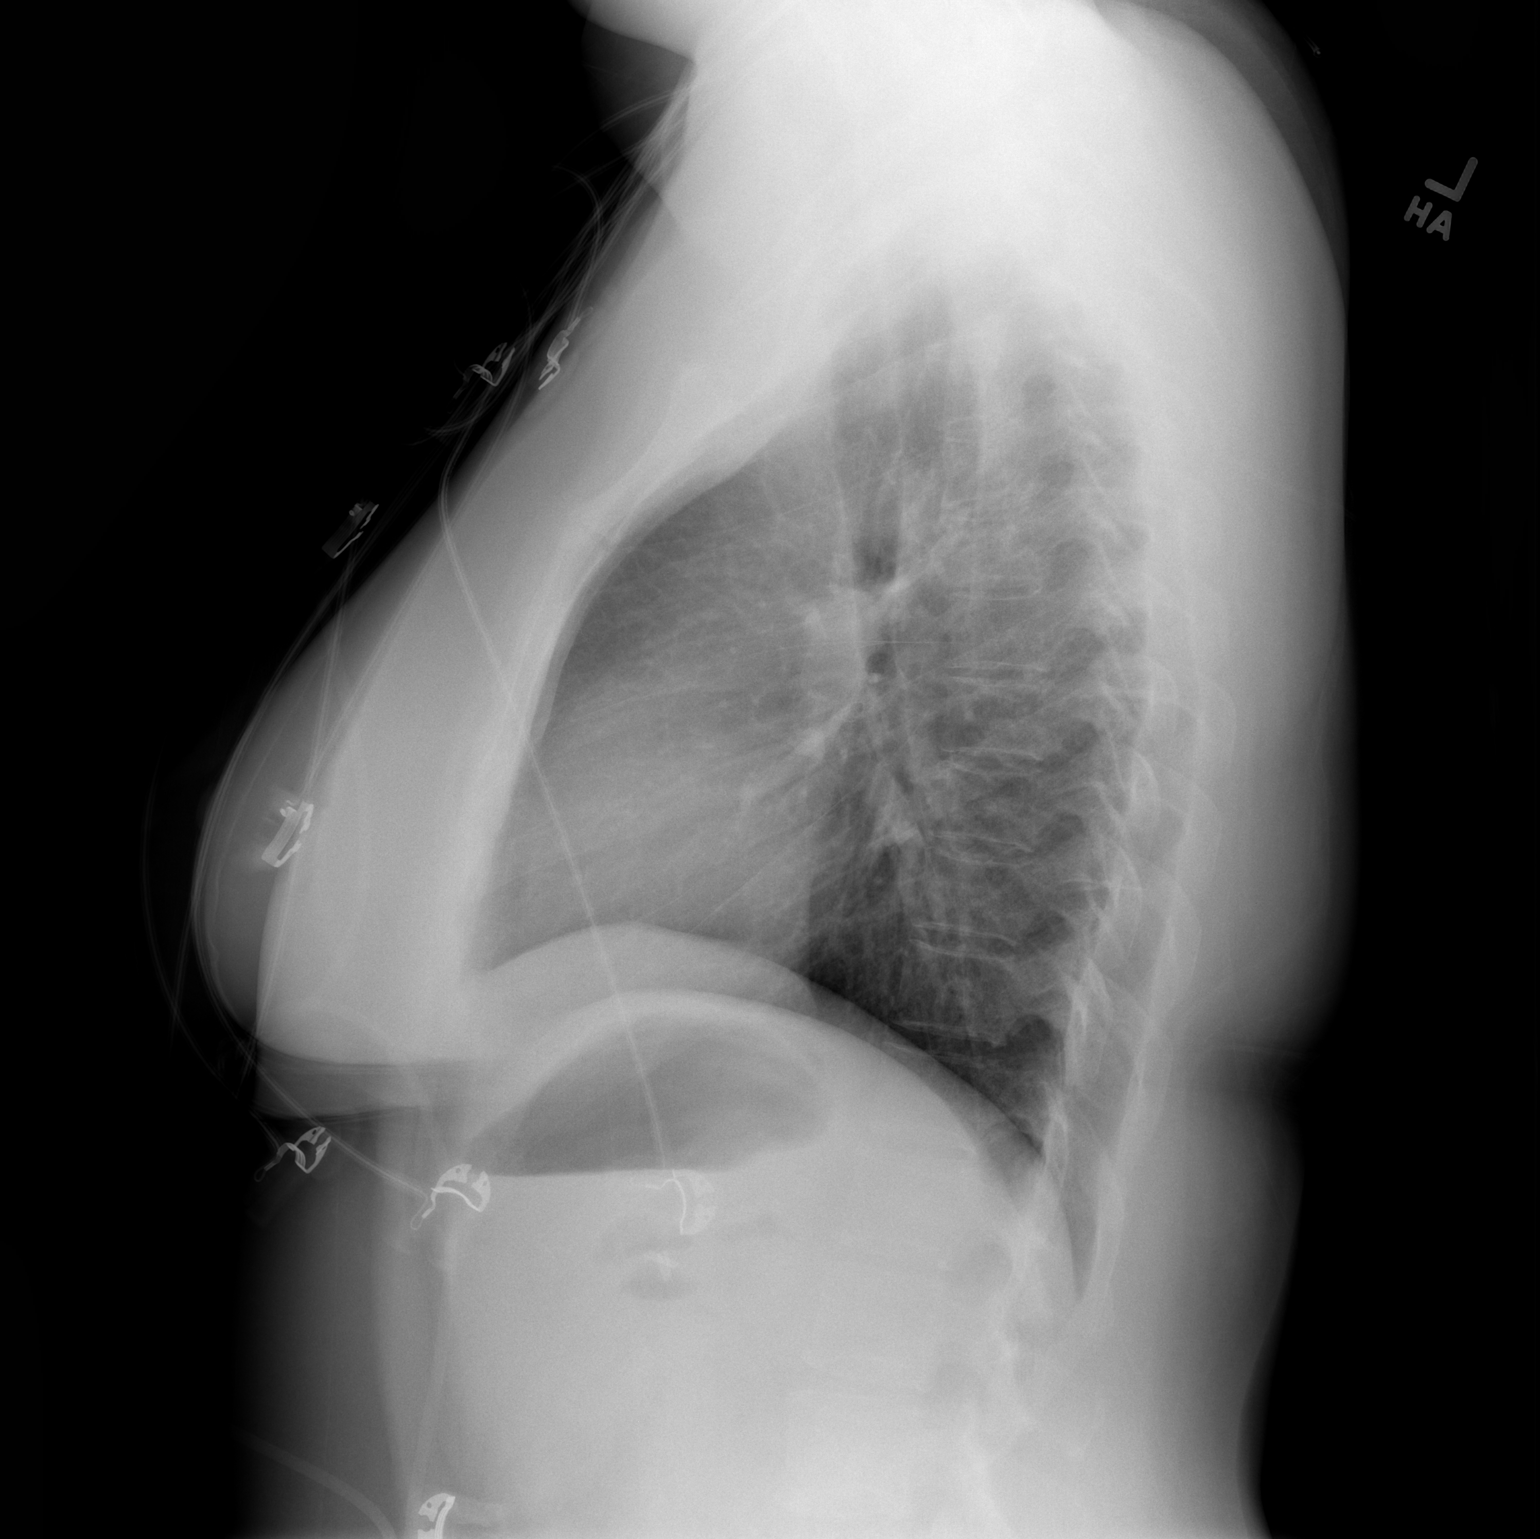

[2 of 2 positions shown; findings below may reference images not displayed]

FINDINGS: State heart size and mediastinal contours are within normal limits.
Both lungs are clear. The visualized skeletal structures are
unremarkable. Right upper quadrant cholecystectomy clips.
IMPRESSION: No active cardiopulmonary disease.

By: Mrudula Haugen M.D.

## 2018-04-24 ENCOUNTER — Encounter (INDEPENDENT_AMBULATORY_CARE_PROVIDER_SITE_OTHER): Payer: Self-pay | Admitting: Orthopedic Surgery

## 2018-08-15 IMAGING — DX DG LUMBAR SPINE 2-3V
3 series · 3 of 3 positions shown · non-contrast
Comparison: Coronal and sagittal reconstructed images from an
abdominal and pelvic CT scan June 15, 2016

CLINICAL DATA: Low back pain since age 42

EXAM:
LUMBAR SPINE - 2-3 VIEW

[l-spine ap]
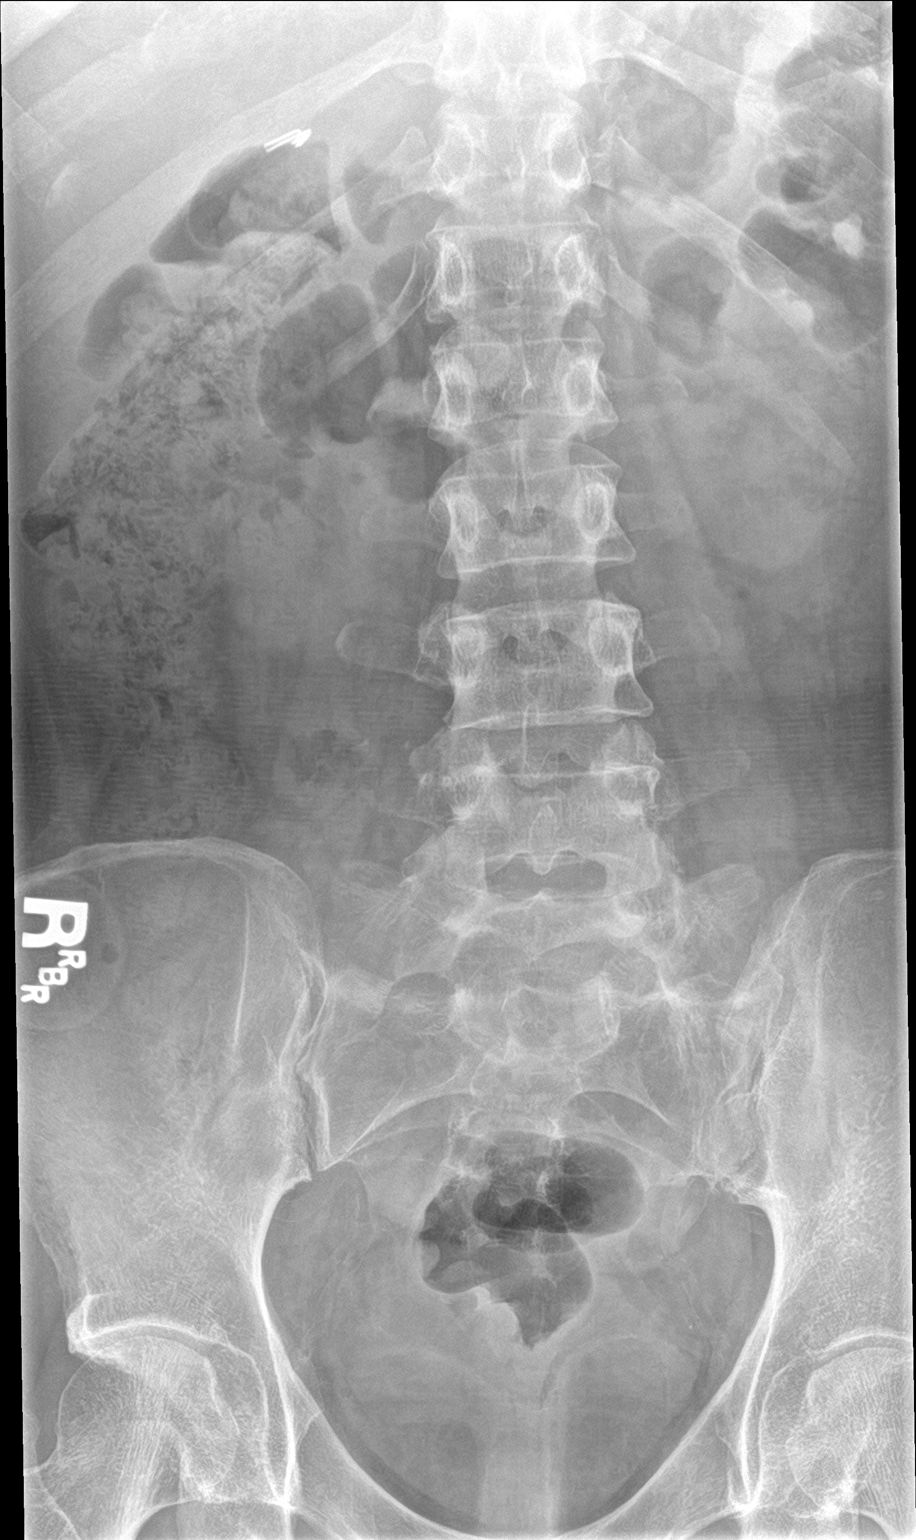

[l-spine lat]
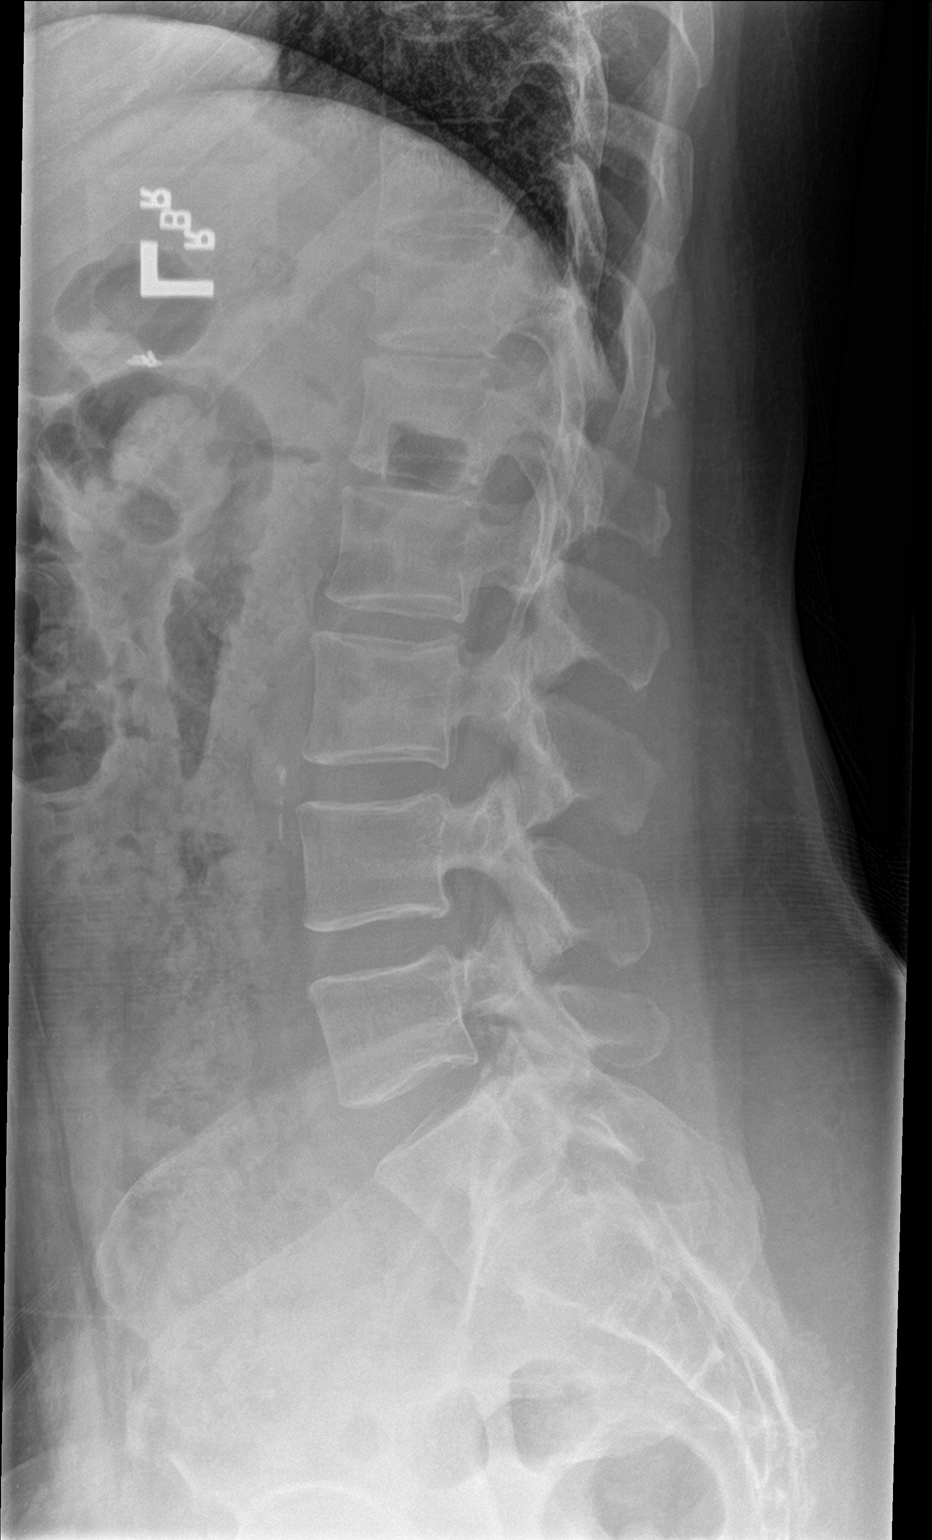

[l-spine spot]
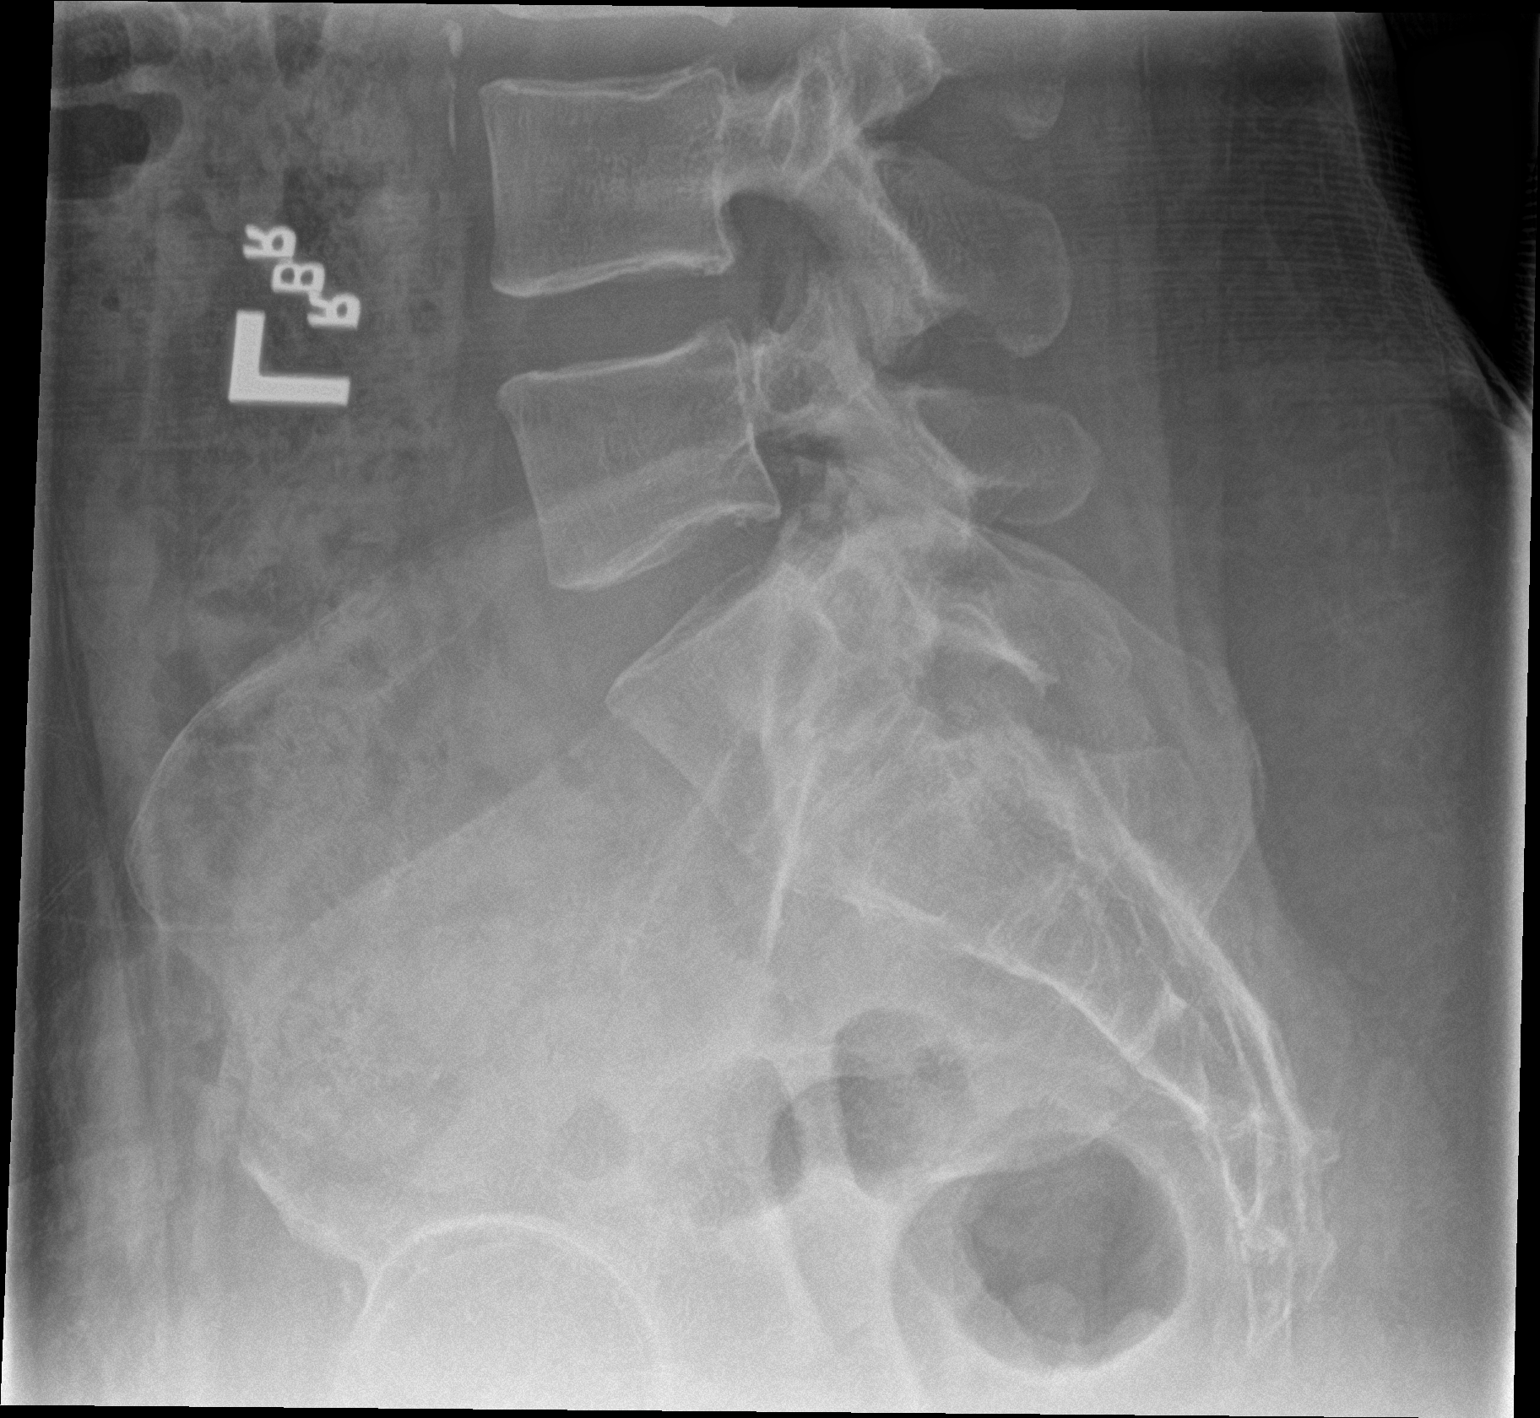

[3 of 3 positions shown; findings below may reference images not displayed]

FINDINGS: There is partial sacralization of L5. The lumbar vertebral bodies
are preserved in height. The pedicles and transverse processes are
intact. There are pseudarthroses at L5 bilaterally the disc space
heights are well maintained. There is no spondylolisthesis. The
observed portions of the sacrum are normal.
IMPRESSION: No compression fracture or significant disc space height loss. L5 is
transitional and exhibits pseudarthroses with S1 bilaterally.

## 2018-09-12 IMAGING — US US RENAL
1 series · 14 of 22 positions shown · non-contrast
Comparison: CT abdomen and pelvis February 19, 2015

CLINICAL DATA: Flank pain

EXAM:
RENAL / URINARY TRACT ULTRASOUND COMPLETE

[Series 1: us renal · 0.22mm/px · 14 of 22 slices shown]
[im 1/22]
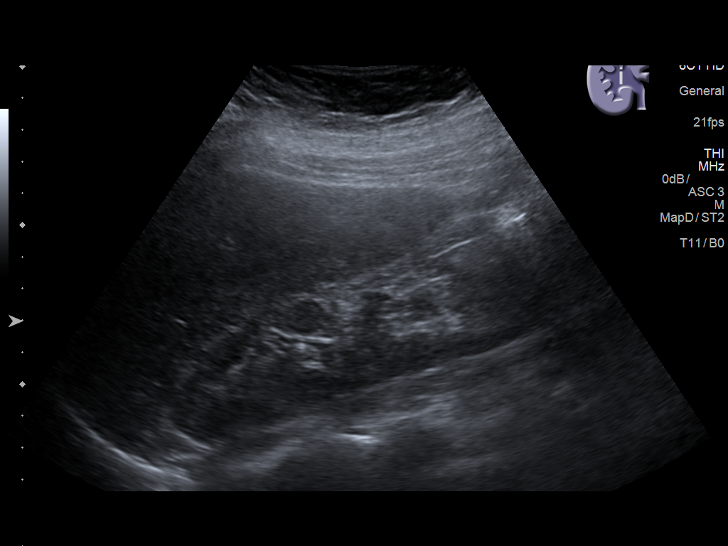
[im 3/22]
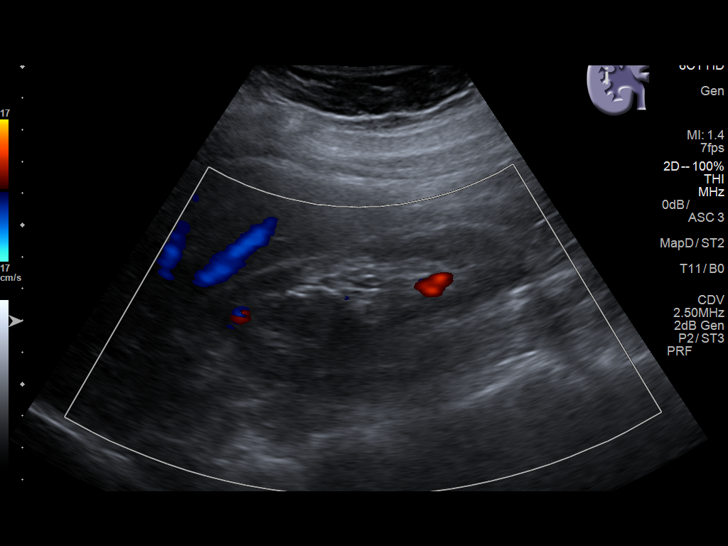
[im 4/22]
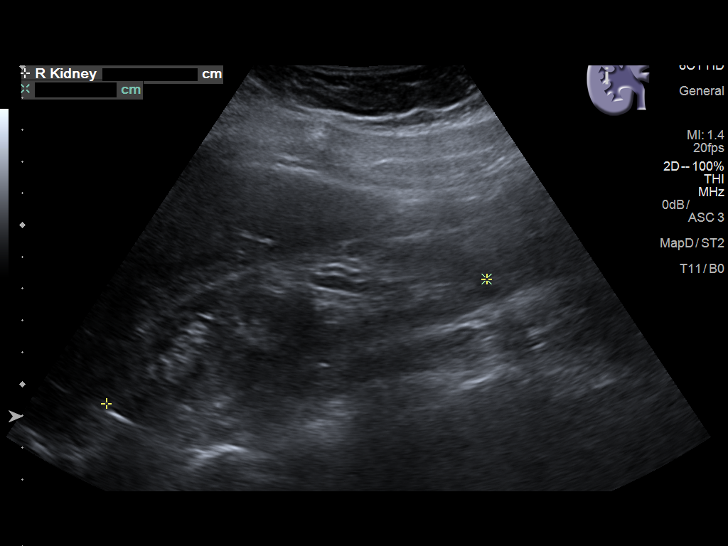
[im 6/22]
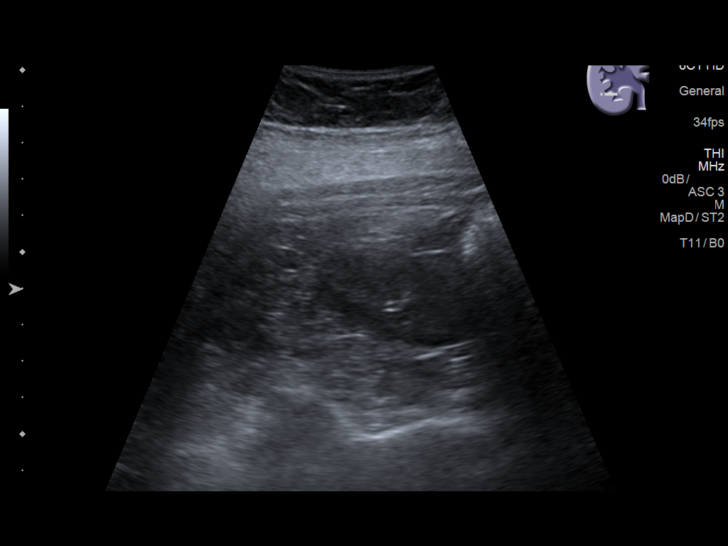
[im 8/22]
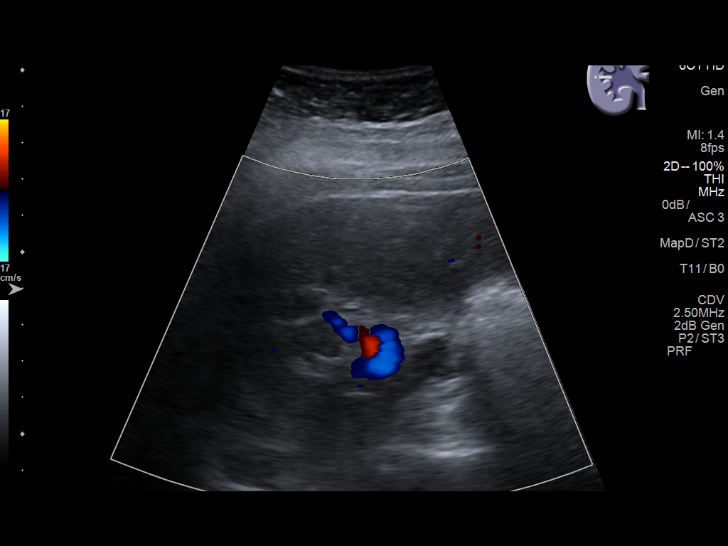
[im 9/22]
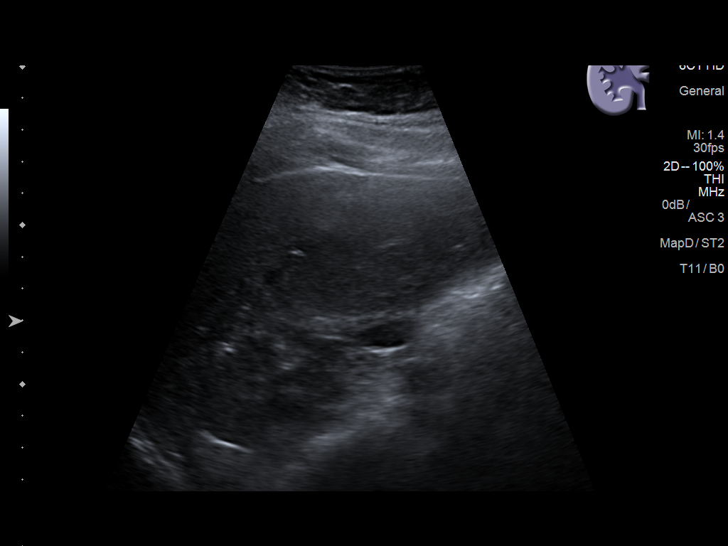
[im 11/22]
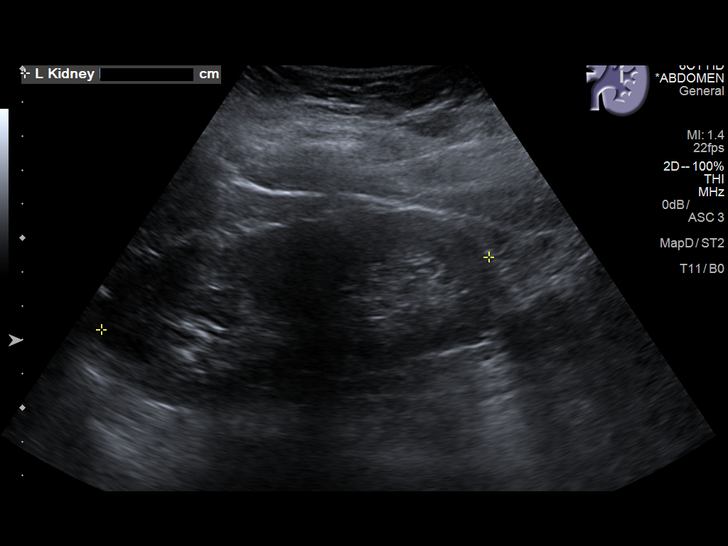
[im 12/22]
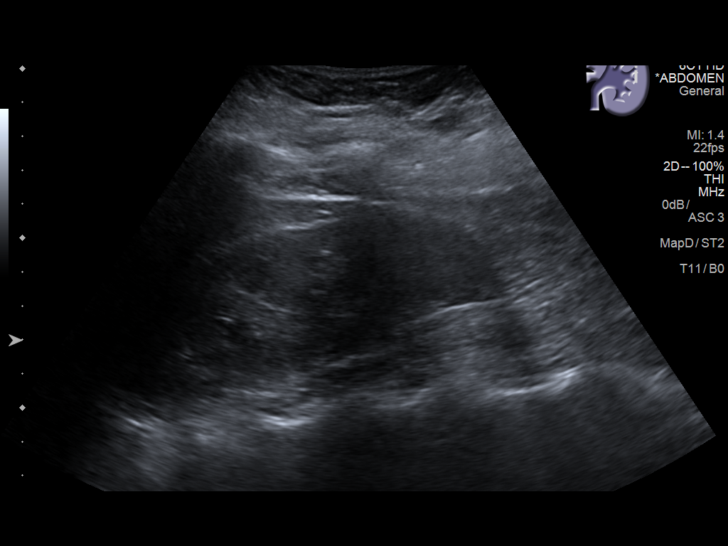
[im 14/22]
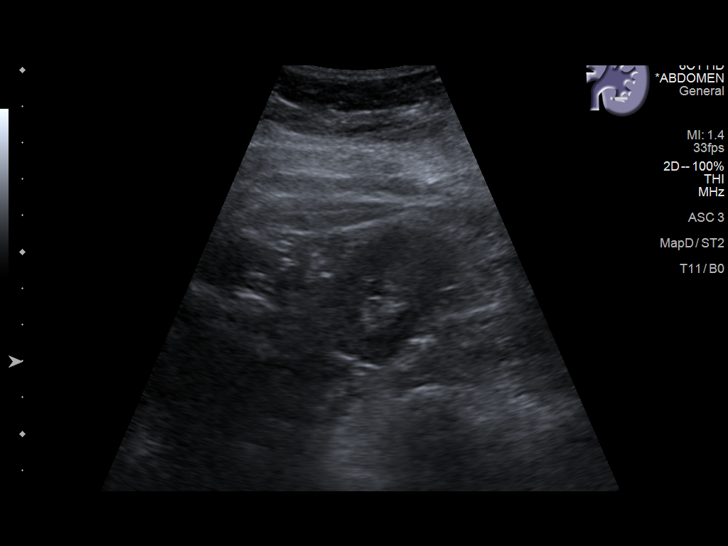
[im 15/22]
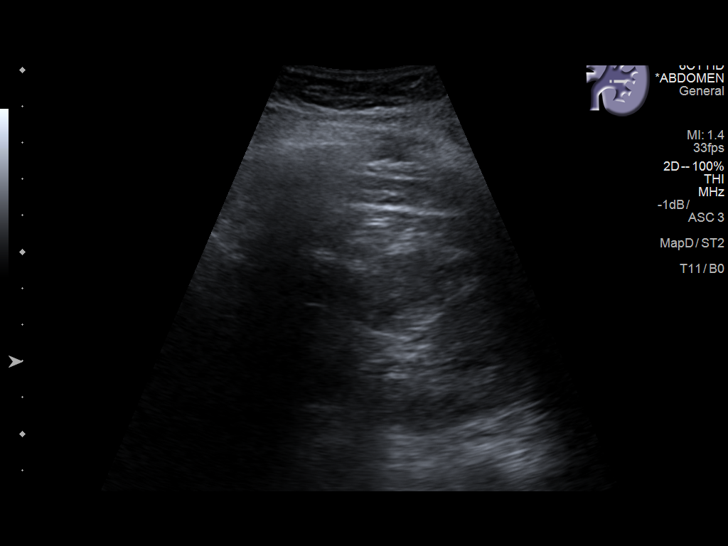
[im 17/22]
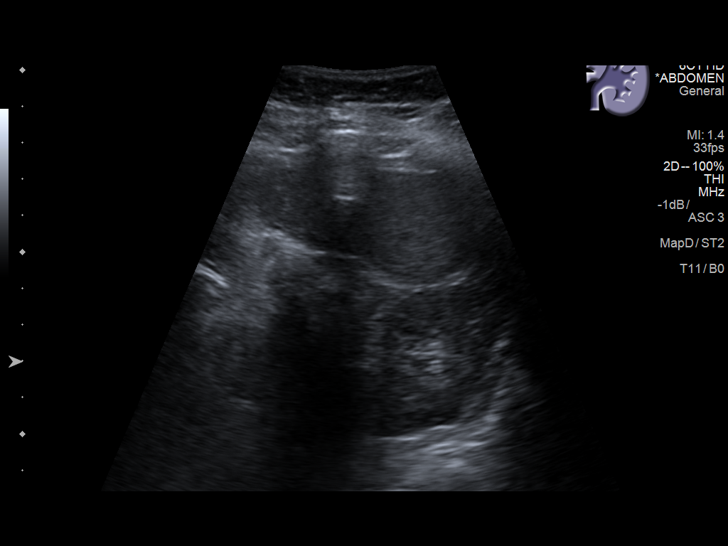
[im 19/22]
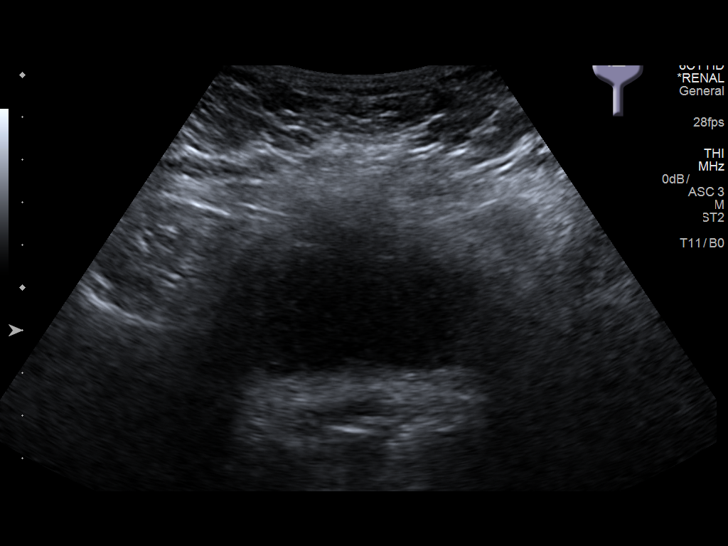
[im 20/22]
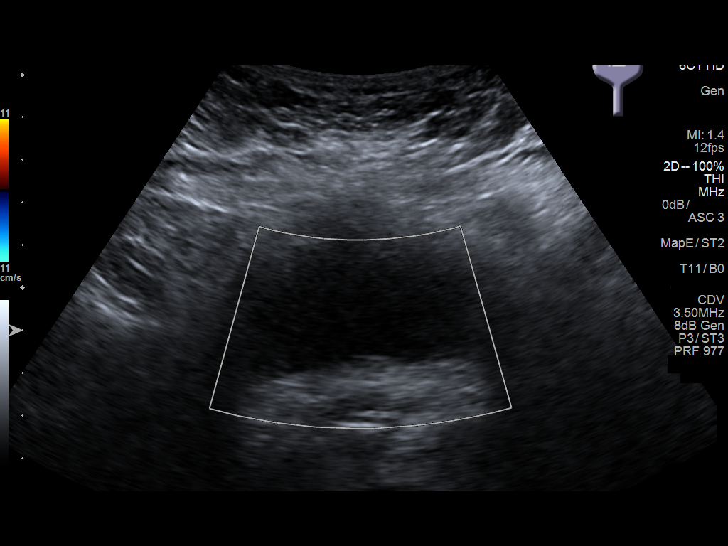
[im 22/22]
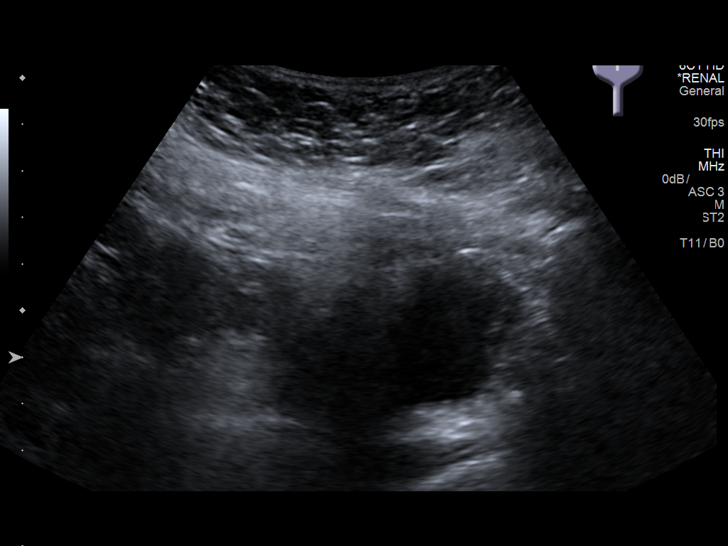

[14 of 22 positions shown; findings below may reference images not displayed]

FINDINGS: Right Kidney:

Length: 12.6 cm. Right kidney is mildly malrotated, unchanged from
CT examination. Echogenicity and renal cortical thickness are within
normal limits. No mass, perinephric fluid, or hydronephrosis
visualized. No sonographically demonstrable calculus or
ureterectasis.

Left Kidney:

Length: 11.6 cm. Echogenicity and renal cortical thickness are
within normal limits. No mass, perinephric fluid, or hydronephrosis
visualized. No sonographically demonstrable calculus or
ureterectasis.

Bladder:

Appears normal for degree of bladder distention.
IMPRESSION: There is a degree of right renal malrotation, unchanged from prior
CT. No obstructing focus in either kidney. Study otherwise
unremarkable.

## 2018-11-13 IMAGING — CT CT ABD-PELV W/ CM
3 of 13 series · 12 of 46 positions shown, 18 images · IV contrast (iopamidol)
Comparison: CT abdomen pelvis 06/15/2016

CLINICAL DATA: Shortness of breath, lower abdominal pain.

EXAM:
CT ANGIOGRAPHY CHEST
CT ABDOMEN AND PELVIS WITH CONTRAST
TECHNIQUE: Multidetector CT imaging of the chest was performed using the
standard protocol during bolus administration of intravenous
contrast. Multiplanar CT image reconstructions and MIPs were
obtained to evaluate the vascular anatomy. Multidetector CT imaging
of the abdomen and pelvis was performed using the standard protocol
during bolus administration of intravenous contrast.
CONTRAST:  100mL [id number] IOPAMIDOL ([id number]) INJECTION 76%

[Series 6: pe thins · axial · 0.66mm/px · z∈[+16,+200]mm · 7 of 251 slices shown]
[im 17/251  soft-tissue]
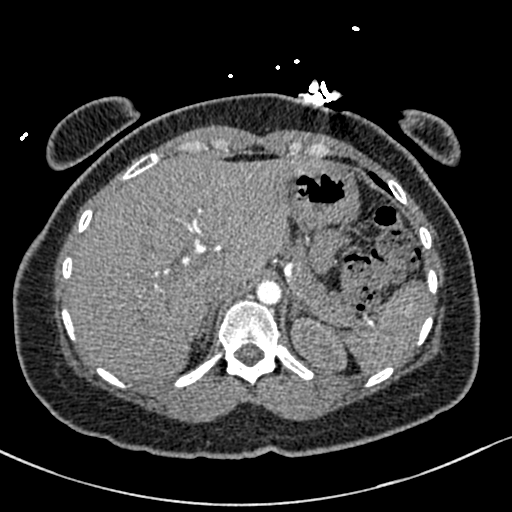
[im 51/251  soft-tissue]
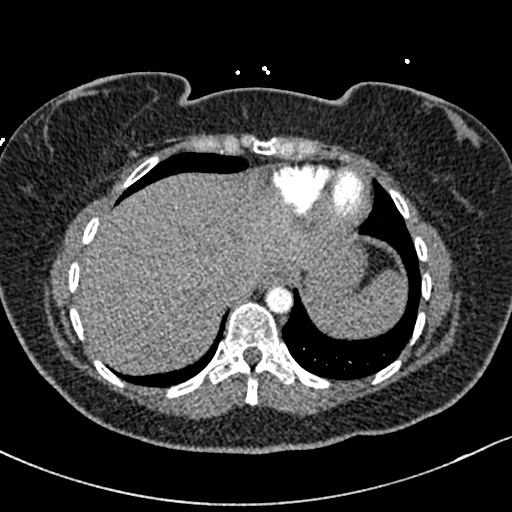
[im 84/251  soft-tissue]
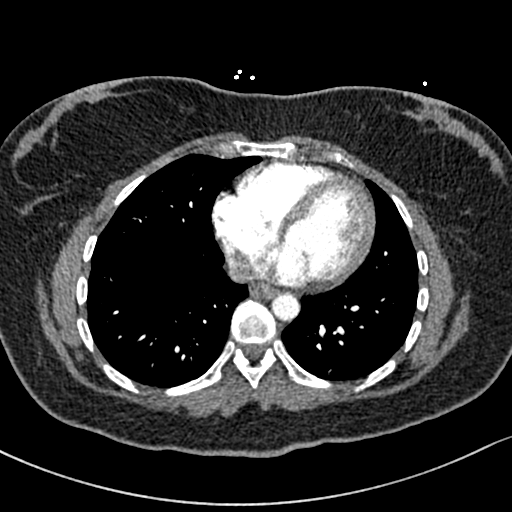
[im 117/251  soft-tissue]
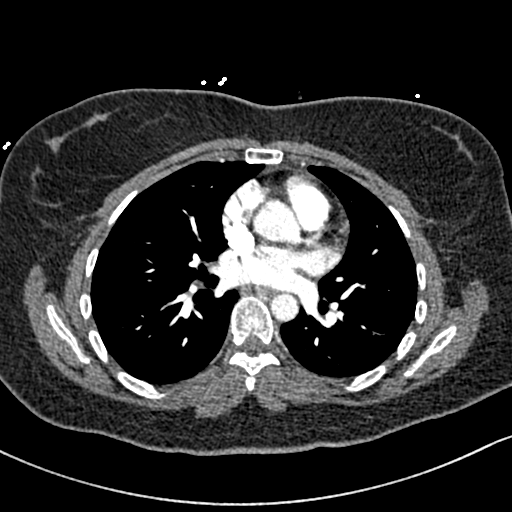
[im 134/251  soft-tissue]
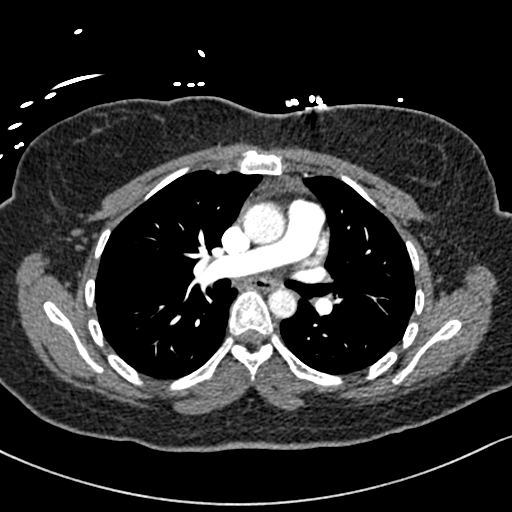
[im 167/251  soft-tissue]
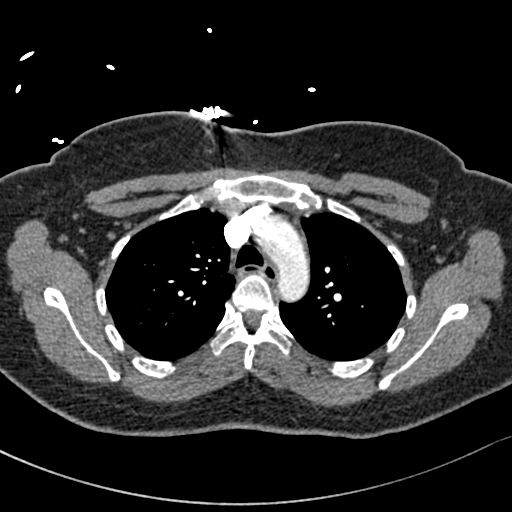
[im 201/251  soft-tissue]
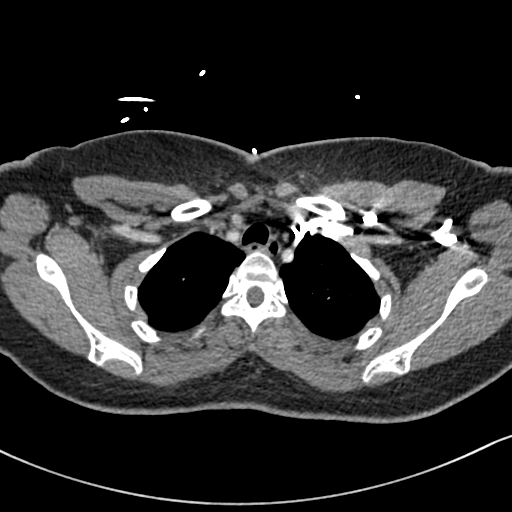

[Series 7: axial st · axial · 0.71mm/px · z∈[-270,-6]mm · 4 of 89 slices shown, 9 images]
[im 18/89  soft-tissue]
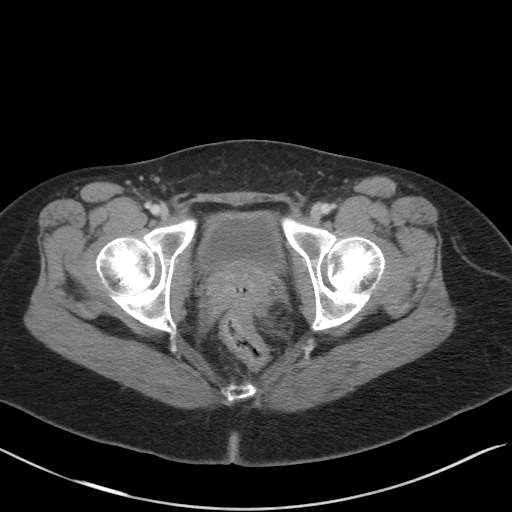
[im 18/89  lung]
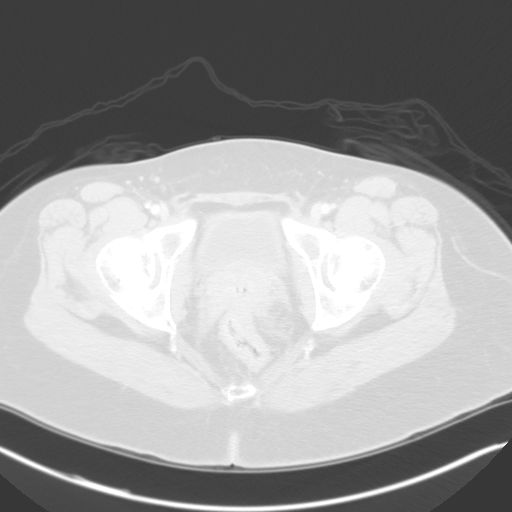
[im 18/89  bone]
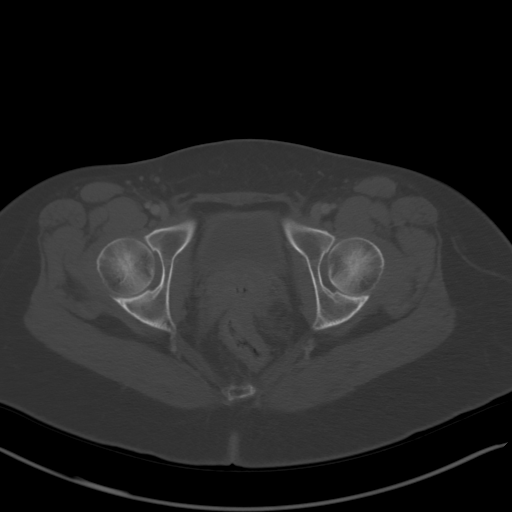
[im 36/89  soft-tissue]
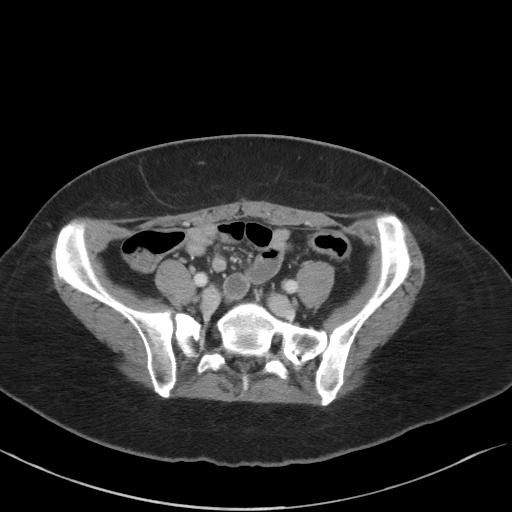
[im 36/89  lung]
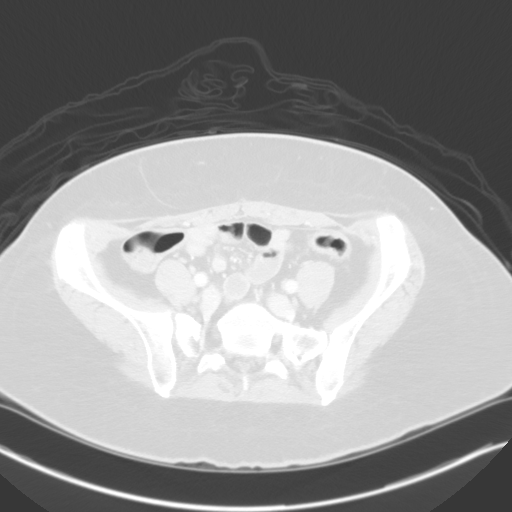
[im 53/89  soft-tissue]
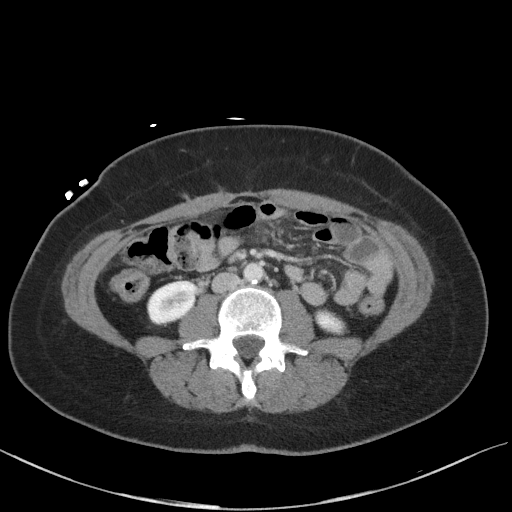
[im 53/89  lung]
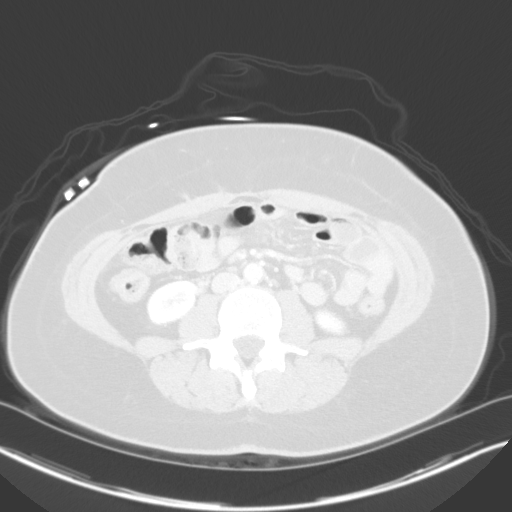
[im 71/89  soft-tissue]
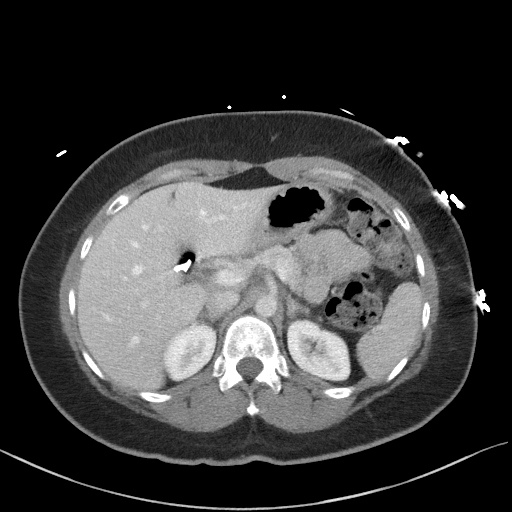
[im 71/89  lung]
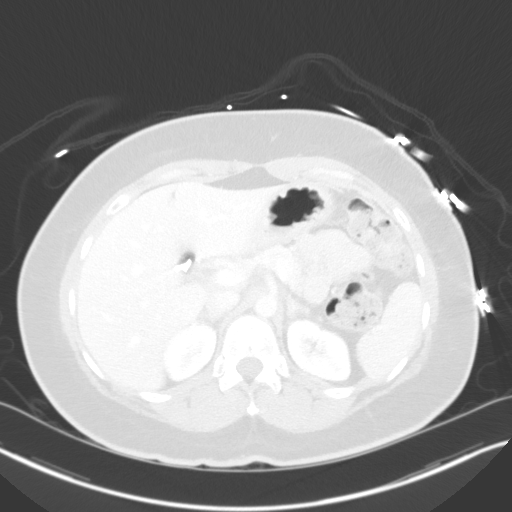

[Series 9: pe coronal mpr · coronal · 0.44mm/px · 1 of 115 slices shown, 2 images]
[im 58/115  soft-tissue]
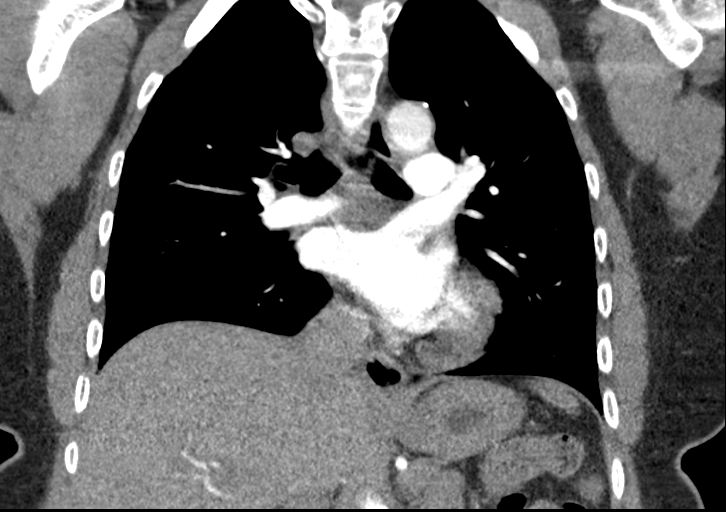
[im 58/115  bone]
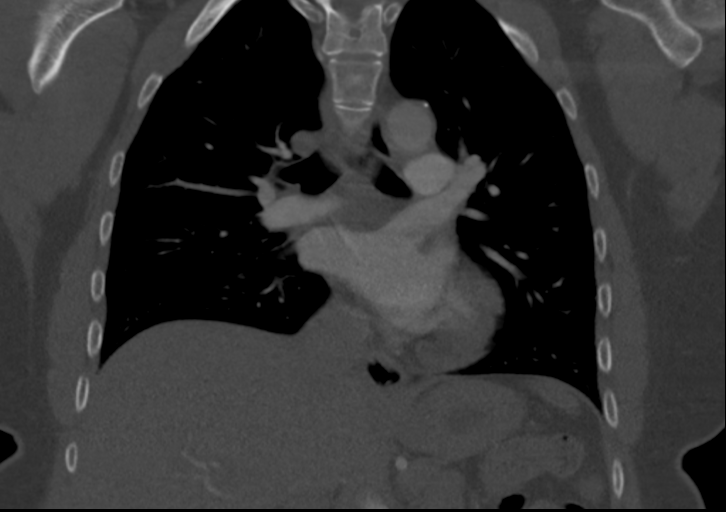

[12 of 46 positions shown; findings below may reference images not displayed]

FINDINGS: CTA CHEST FINDINGS

Cardiovascular: No filling defects in the pulmonary artery is to
suggest pulmonary emboli. Heart is normal size. Aorta is normal
caliber.

Mediastinum/Nodes: No mediastinal, hilar, or axillary adenopathy.
Trachea and esophagus are unremarkable.

Lungs/Pleura: Lungs are clear. No focal airspace opacities or
suspicious nodules. No effusions.

Musculoskeletal: No acute bony abnormality.

Review of the MIP images confirms the above findings.

CT ABDOMEN and PELVIS FINDINGS

Hepatobiliary: No focal liver abnormality is seen. Status post
cholecystectomy. No biliary dilatation.

Pancreas: No focal abnormality or ductal dilatation.

Spleen: No focal abnormality.  Normal size.

Adrenals/Urinary Tract: No adrenal abnormality. No focal renal
abnormality. No stones or hydronephrosis. Urinary bladder is
unremarkable.

Stomach/Bowel: Prior appendectomy. Stomach, large and small bowel
grossly unremarkable.

Vascular/Lymphatic: No evidence of aneurysm or adenopathy.

Reproductive:  No acute findings.

Other: No abdominal wall hernia or abnormality. No abdominopelvic
ascites.

Musculoskeletal: No acute bony abnormality.

Review of the MIP images confirms the above findings.
IMPRESSION: No evidence of pulmonary embolus.  No acute cardiopulmonary disease.

No acute findings in the abdomen or pelvis.

## 2019-03-20 ENCOUNTER — Encounter: Payer: Self-pay | Admitting: Cardiology

## 2019-05-01 ENCOUNTER — Emergency Department (HOSPITAL_BASED_OUTPATIENT_CLINIC_OR_DEPARTMENT_OTHER): Payer: Medicare Other

## 2019-05-01 ENCOUNTER — Other Ambulatory Visit: Payer: Self-pay

## 2019-05-01 ENCOUNTER — Encounter (HOSPITAL_BASED_OUTPATIENT_CLINIC_OR_DEPARTMENT_OTHER): Payer: Self-pay | Admitting: Emergency Medicine

## 2019-05-01 ENCOUNTER — Inpatient Hospital Stay (HOSPITAL_BASED_OUTPATIENT_CLINIC_OR_DEPARTMENT_OTHER)
Admission: EM | Admit: 2019-05-01 | Discharge: 2019-05-03 | DRG: 637 | Disposition: A | Discharge status: Home / Self Care | Payer: Medicare Other | Attending: Family Medicine | Admitting: Family Medicine

## 2019-05-01 DIAGNOSIS — M797 Fibromyalgia: Secondary | ICD-10-CM | POA: Diagnosis not present

## 2019-05-01 DIAGNOSIS — Z90710 Acquired absence of both cervix and uterus: Secondary | ICD-10-CM | POA: Diagnosis not present

## 2019-05-01 DIAGNOSIS — Z20822 Contact with and (suspected) exposure to covid-19: Secondary | ICD-10-CM | POA: Diagnosis not present

## 2019-05-01 DIAGNOSIS — R4182 Altered mental status, unspecified: Secondary | ICD-10-CM

## 2019-05-01 DIAGNOSIS — F419 Anxiety disorder, unspecified: Secondary | ICD-10-CM | POA: Diagnosis present

## 2019-05-01 DIAGNOSIS — R739 Hyperglycemia, unspecified: Secondary | ICD-10-CM | POA: Diagnosis not present

## 2019-05-01 DIAGNOSIS — F4310 Post-traumatic stress disorder, unspecified: Secondary | ICD-10-CM | POA: Diagnosis present

## 2019-05-01 DIAGNOSIS — Z79891 Long term (current) use of opiate analgesic: Secondary | ICD-10-CM

## 2019-05-01 DIAGNOSIS — Z818 Family history of other mental and behavioral disorders: Secondary | ICD-10-CM | POA: Diagnosis not present

## 2019-05-01 DIAGNOSIS — Z87442 Personal history of urinary calculi: Secondary | ICD-10-CM

## 2019-05-01 DIAGNOSIS — Z79899 Other long term (current) drug therapy: Secondary | ICD-10-CM

## 2019-05-01 DIAGNOSIS — M1712 Unilateral primary osteoarthritis, left knee: Secondary | ICD-10-CM | POA: Diagnosis not present

## 2019-05-01 DIAGNOSIS — Z888 Allergy status to other drugs, medicaments and biological substances status: Secondary | ICD-10-CM

## 2019-05-01 DIAGNOSIS — E1065 Type 1 diabetes mellitus with hyperglycemia: Secondary | ICD-10-CM | POA: Diagnosis not present

## 2019-05-01 DIAGNOSIS — F1210 Cannabis abuse, uncomplicated: Secondary | ICD-10-CM | POA: Diagnosis present

## 2019-05-01 DIAGNOSIS — F329 Major depressive disorder, single episode, unspecified: Secondary | ICD-10-CM | POA: Diagnosis not present

## 2019-05-01 DIAGNOSIS — Z86718 Personal history of other venous thrombosis and embolism: Secondary | ICD-10-CM

## 2019-05-01 DIAGNOSIS — Z794 Long term (current) use of insulin: Secondary | ICD-10-CM

## 2019-05-01 DIAGNOSIS — Z841 Family history of disorders of kidney and ureter: Secondary | ICD-10-CM

## 2019-05-01 DIAGNOSIS — Z807 Family history of other malignant neoplasms of lymphoid, hematopoietic and related tissues: Secondary | ICD-10-CM

## 2019-05-01 DIAGNOSIS — E1042 Type 1 diabetes mellitus with diabetic polyneuropathy: Secondary | ICD-10-CM | POA: Diagnosis present

## 2019-05-01 DIAGNOSIS — Z813 Family history of other psychoactive substance abuse and dependence: Secondary | ICD-10-CM

## 2019-05-01 DIAGNOSIS — Z833 Family history of diabetes mellitus: Secondary | ICD-10-CM | POA: Diagnosis not present

## 2019-05-01 DIAGNOSIS — Z7901 Long term (current) use of anticoagulants: Secondary | ICD-10-CM

## 2019-05-01 DIAGNOSIS — E785 Hyperlipidemia, unspecified: Secondary | ICD-10-CM | POA: Diagnosis not present

## 2019-05-01 DIAGNOSIS — G9341 Metabolic encephalopathy: Secondary | ICD-10-CM | POA: Diagnosis not present

## 2019-05-01 DIAGNOSIS — G8929 Other chronic pain: Secondary | ICD-10-CM | POA: Diagnosis not present

## 2019-05-01 DIAGNOSIS — Z9089 Acquired absence of other organs: Secondary | ICD-10-CM | POA: Diagnosis not present

## 2019-05-01 DIAGNOSIS — E10319 Type 1 diabetes mellitus with unspecified diabetic retinopathy without macular edema: Secondary | ICD-10-CM | POA: Diagnosis not present

## 2019-05-01 DIAGNOSIS — F411 Generalized anxiety disorder: Secondary | ICD-10-CM | POA: Diagnosis not present

## 2019-05-01 DIAGNOSIS — Z881 Allergy status to other antibiotic agents status: Secondary | ICD-10-CM

## 2019-05-01 DIAGNOSIS — Z811 Family history of alcohol abuse and dependence: Secondary | ICD-10-CM

## 2019-05-01 DIAGNOSIS — Z8673 Personal history of transient ischemic attack (TIA), and cerebral infarction without residual deficits: Secondary | ICD-10-CM

## 2019-05-01 DIAGNOSIS — Z86711 Personal history of pulmonary embolism: Secondary | ICD-10-CM | POA: Diagnosis not present

## 2019-05-01 DIAGNOSIS — Z8249 Family history of ischemic heart disease and other diseases of the circulatory system: Secondary | ICD-10-CM

## 2019-05-01 DIAGNOSIS — G4733 Obstructive sleep apnea (adult) (pediatric): Secondary | ICD-10-CM | POA: Diagnosis present

## 2019-05-01 DIAGNOSIS — D72829 Elevated white blood cell count, unspecified: Secondary | ICD-10-CM | POA: Diagnosis present

## 2019-05-01 DIAGNOSIS — Z886 Allergy status to analgesic agent status: Secondary | ICD-10-CM

## 2019-05-01 DIAGNOSIS — Z9049 Acquired absence of other specified parts of digestive tract: Secondary | ICD-10-CM | POA: Diagnosis not present

## 2019-05-01 DIAGNOSIS — Z83518 Family history of other specified eye disorder: Secondary | ICD-10-CM

## 2019-05-01 DIAGNOSIS — Z800 Family history of malignant neoplasm of digestive organs: Secondary | ICD-10-CM

## 2019-05-01 LAB — CBC WITH DIFFERENTIAL/PLATELET
Abs Immature Granulocytes: 0.05 10*3/uL (ref 0.00–0.07)
Basophils Absolute: 0.1 10*3/uL (ref 0.0–0.1)
Basophils Relative: 0 %
Eosinophils Absolute: 0 10*3/uL (ref 0.0–0.5)
Eosinophils Relative: 0 %
HCT: 42.2 % (ref 36.0–46.0)
Hemoglobin: 14.3 g/dL (ref 12.0–15.0)
Immature Granulocytes: 0 %
Lymphocytes Relative: 8 %
Lymphs Abs: 1.2 10*3/uL (ref 0.7–4.0)
MCH: 32.3 pg (ref 26.0–34.0)
MCHC: 33.9 g/dL (ref 30.0–36.0)
MCV: 95.3 fL (ref 80.0–100.0)
Monocytes Absolute: 0.7 10*3/uL (ref 0.1–1.0)
Monocytes Relative: 5 %
Neutro Abs: 12.3 10*3/uL — ABNORMAL HIGH (ref 1.7–7.7)
Neutrophils Relative %: 87 %
Platelets: 330 10*3/uL (ref 150–400)
RBC: 4.43 MIL/uL (ref 3.87–5.11)
RDW: 12 % (ref 11.5–15.5)
WBC: 14.3 10*3/uL — ABNORMAL HIGH (ref 4.0–10.5)
nRBC: 0 % (ref 0.0–0.2)

## 2019-05-01 LAB — POCT I-STAT EG7
Acid-Base Excess: 3 mmol/L — ABNORMAL HIGH (ref 0.0–2.0)
Bicarbonate: 28.8 mmol/L — ABNORMAL HIGH (ref 20.0–28.0)
Calcium, Ion: 1.17 mmol/L (ref 1.15–1.40)
HCT: 43 % (ref 36.0–46.0)
Hemoglobin: 14.6 g/dL (ref 12.0–15.0)
O2 Saturation: 41 %
Potassium: 5 mmol/L (ref 3.5–5.1)
Sodium: 136 mmol/L (ref 135–145)
TCO2: 30 mmol/L (ref 22–32)
pCO2, Ven: 46.6 mmHg (ref 44.0–60.0)
pH, Ven: 7.399 (ref 7.250–7.430)
pO2, Ven: 24 mmHg — CL (ref 32.0–45.0)

## 2019-05-01 LAB — CBG MONITORING, ED
Glucose-Capillary: 459 mg/dL — ABNORMAL HIGH (ref 70–99)
Glucose-Capillary: 515 mg/dL (ref 70–99)

## 2019-05-01 LAB — BASIC METABOLIC PANEL
Anion gap: 11 (ref 5–15)
Anion gap: 11 (ref 5–15)
BUN: 10 mg/dL (ref 6–20)
BUN: 10 mg/dL (ref 6–20)
CO2: 24 mmol/L (ref 22–32)
CO2: 27 mmol/L (ref 22–32)
Calcium: 8.5 mg/dL — ABNORMAL LOW (ref 8.9–10.3)
Calcium: 9 mg/dL (ref 8.9–10.3)
Chloride: 97 mmol/L — ABNORMAL LOW (ref 98–111)
Chloride: 100 mmol/L (ref 98–111)
Creatinine, Ser: 0.97 mg/dL (ref 0.44–1.00)
Creatinine, Ser: 0.95 mg/dL (ref 0.44–1.00)
GFR calc Af Amer: >60 mL/min (ref >60)
GFR calc Af Amer: >60 mL/min (ref >60)
GFR calc non Af Amer: >60 mL/min (ref >60)
GFR calc non Af Amer: >60 mL/min (ref >60)
Glucose, Bld: 491 mg/dL — ABNORMAL HIGH (ref 70–99)
Glucose, Bld: 344 mg/dL — ABNORMAL HIGH (ref 70–99)
Potassium: 4.8 mmol/L (ref 3.5–5.1)
Potassium: 4 mmol/L (ref 3.5–5.1)
Sodium: 132 mmol/L — ABNORMAL LOW (ref 135–145)
Sodium: 138 mmol/L (ref 135–145)

## 2019-05-01 LAB — BETA-HYDROXYBUTYRIC ACID: Beta-Hydroxybutyric Acid: 1.27 mmol/L — ABNORMAL HIGH (ref 0.05–0.27)

## 2019-05-01 LAB — TROPONIN I (HIGH SENSITIVITY)
Troponin I (High Sensitivity): 3 ng/L (ref <18)
Troponin I (High Sensitivity): 4 ng/L (ref <18)

## 2019-05-01 LAB — ETHANOL: Alcohol, Ethyl (B): <10 mg/dL (ref <10)

## 2019-05-01 MED ORDER — INSULIN REGULAR HUMAN 100 UNIT/ML IJ SOLN
10.0000 [IU] | Freq: Once | INTRAMUSCULAR | Status: DC
  Filled 2019-05-01: qty 3

## 2019-05-01 MED ORDER — SODIUM CHLORIDE 0.9 % IV SOLN: INTRAVENOUS | Status: DC

## 2019-05-01 MED ORDER — INSULIN ASPART 100 UNIT/ML ~~LOC~~ SOLN
10.0000 [IU] | Freq: Once | SUBCUTANEOUS | Status: AC
  Administered 2019-05-01: 10 [IU] via INTRAVENOUS
  Filled 2019-05-01: qty 1

## 2019-05-01 MED ORDER — INSULIN REGULAR(HUMAN) IN NACL 100-0.9 UT/100ML-% IV SOLN
INTRAVENOUS | Status: DC
  Administered 2019-05-02: 11.5 [IU]/h via INTRAVENOUS
  Filled 2019-05-01: qty 100

## 2019-05-01 MED ORDER — LORAZEPAM 2 MG/ML IJ SOLN
1.0000 mg | Freq: Once | INTRAMUSCULAR | Status: AC
  Administered 2019-05-01: 1 mg via INTRAVENOUS
  Filled 2019-05-01: qty 1

## 2019-05-01 MED ORDER — DEXTROSE-NACL 5-0.45 % IV SOLN: INTRAVENOUS | Status: DC

## 2019-05-01 MED ORDER — LACTATED RINGERS IV BOLUS
1000.0000 mL | Freq: Once | INTRAVENOUS | Status: AC
  Administered 2019-05-01: 1000 mL via INTRAVENOUS

## 2019-05-01 MED ORDER — POTASSIUM CHLORIDE 10 MEQ/100ML IV SOLN
10.0000 meq | INTRAVENOUS | Status: AC
  Administered 2019-05-02: 10 meq via INTRAVENOUS
  Filled 2019-05-01: qty 100

## 2019-05-01 MED ORDER — DEXTROSE 50 % IV SOLN: 0.0000 mL | INTRAVENOUS | Status: DC | PRN

## 2019-05-01 NOTE — ED Provider Notes (Signed)
[facility] EMERGENCY DEPARTMENT Provider Note   CSN: [id number] Arrival date & time: 05/01/19  1735     History Chief Complaint  Patient presents with  . Hyperglycemia    Maureen [name] is a 49 y.o. female with history of Type 1 DM, TIA, anxiety who presents with AMS and hyperglycemia. Pt is not answering questions or following commands on my exam limiting my evaluation. Her niece is at bedside and states that she got a call from her aunt today stating that she felt like she was having a heart attack. She went over to her house and EMS were already there and she wasn't responding to her but her eyes were fluttering. She states her aunt sounds very panicked and she is an anxious person in general.   LEVEL 5 caveat due to AMS   HPI     Past Medical History:  Diagnosis Date  . Abdominal pain   . Achilles tendon contracture, left    with Lisfranc fracture  . Anxiety   . Arthritis   . Biliary dyskinesia   . Cervical strain   . Closed head injury 2010  . Depression   . DVT (deep venous thrombosis) ([name])   . Elevated LFTs   . Fibromyalgia   . GERD (gastroesophageal reflux disease)   . History of kidney stones   . Hyperlipidemia   . Hypoglycemia   . Joint pain   . Major depressive disorder   . Migraine    hx of none recent  . Nausea & vomiting   . OSA on CPAP 05/14/2016   Moderate to severe OSA with an AHI of 28/hr now on CPAP at 19cm H2O  . Pneumonia   . Post traumatic stress disorder (PTSD)   . Pulmonary embolism ([place])   . Renal stones   . Skin rash    due to medications  . Sleep trouble   . Stroke [facility])    " series of mini strokes around 2007"  . TIA (transient ischemic attack)   . Wears glasses   . Well controlled type 1 diabetes mellitus with peripheral neuropathy ([place])    Type 1    Patient Active Problem List   Diagnosis Date Noted  . Vision loss 02/02/2018  . Acute loss of vision 02/01/2018  . Leukocytosis 02/01/2018  . Hyperglycemia  02/01/2018  . Hyperbilirubinemia 02/01/2018  . VTE (venous thromboembolism) 02/01/2018  . Anxiety and depression 02/01/2018  . Orthostatic hypotension 02/01/2018  . Uncontrolled type 1 diabetes mellitus with hyperglycemia, with long-term current use of insulin ([place]) 02/01/2018  . OSA (obstructive sleep apnea) 05/14/2016  . Obesity (BMI 30-39.9) 05/14/2016  . Acute deep vein thrombosis (DVT) of femoral vein of left lower extremity ([place]) 02/10/2016  . Diabetic polyneuropathy associated with type 2 diabetes mellitus ([place]) 02/10/2016  . Lisfranc dislocation, left, sequela 12/16/2015  . Short Achilles tendon (acquired), left ankle 12/16/2015  . Chest pain with moderate risk for cardiac etiology 11/29/2015  . Elevated troponin 11/19/2015  . Migraine headache 11/19/2015  . Neck pain 11/19/2015  . Acute kidney injury ([place]) 02/19/2015  . DKA (diabetic ketoacidoses) ([place]) 10/24/2014  . HLD (hyperlipidemia) 10/24/2014  . Abnormal LFTs 09/03/2014  . Dizziness 09/03/2014  . Acute pulmonary embolism ([place]) 09/03/2014  . Transaminitis 09/02/2014  . GAD (generalized anxiety disorder) 11/23/2013  . PTSD (post-traumatic stress disorder) 11/23/2013  . 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 Endometriosis 12/08/2011  . Arthritis of left knee 12/08/2011  .  Diabetic retinopathy ([place]) 12/08/2011  . Fibromyalgia 10/23/2011  . DM type 1 (diabetes mellitus, type 1) ([place]) 10/13/2011    Past Surgical History:  Procedure Laterality Date  . ABDOMINAL HYSTERECTOMY  2001  . APPENDECTOMY    . [place]  . CHOLECYSTECTOMY  02/10/2011   Procedure: LAPAROSCOPIC CHOLECYSTECTOMY WITH INTRAOPERATIVE CHOLANGIOGRAM;  Surgeon: Judieth Keens, DO;  Location: [facility];  Service: General;  Laterality: N/A;  . COLONOSCOPY    . DILATION AND CURETTAGE OF UTERUS    . exploratory laparomty  Mar 04, 2012  . EYE SURGERY  2006   laser eye surgery left eye  . KNEE SURGERY Left  2012  . LAPAROSCOPIC APPENDECTOMY N/A 10/12/2012   Procedure: DIAGNOSTIC APPENDECTOMY LAPAROSCOPIC;  Surgeon: Madilyn Hook, DO;  Location: [facility];  Service: General;  Laterality: N/A;  . lipoma removed  yrs ago  . LIVER BIOPSY    . ORIF ANKLE FRACTURE Left 01/03/2016   Procedure: OPEN REDUCTION INTERNAL FIXATION (ORIF) LEFT LISFRANC JOINT, GASTROC RECESSION;  Surgeon: Newt Minion, MD;  Location: [facility];  Service: Orthopedics;  Laterality: Left;  . ROBOTIC ASSISTED LAPAROSCOPIC LYSIS OF ADHESION N/A 03/04/2012   Procedure: ROBOTIC ASSISTED LAPAROSCOPIC LYSIS OF EXTENSIVE ADHESIONS;  Surgeon: Claiborne Billings A. Pamala Hurry, MD;  Location: [facility];  Service: Gynecology;  Laterality: N/A;  . TONSILLECTOMY  2001     OB History   No obstetric history on file.     Family History  Problem Relation Age of Onset  . Cancer Father        lymphoma  . Nephrolithiasis Father   . Vascular Disease Father   . Alcohol abuse Father   . Drug abuse Father   . Cancer Maternal Grandmother        colon  . Hypertension Mother   . Heart disease Mother   . Cataracts Mother   . Depression Mother   . Diabetes Brother   . Drug abuse Brother   . Mental illness Maternal Grandfather   . Cancer Maternal Grandfather   . Heart disease Paternal Grandmother   . Heart disease Paternal Grandfather   . Suicidality Maternal Uncle     Social History   Tobacco Use  . Smoking status: Never Smoker  . Smokeless tobacco: Never Used  Substance Use Topics  . Alcohol use: Yes    Comment: occ  . Drug use: No    Home Medications Prior to Admission medications   Medication Sig Start Date End Date Taking? Authorizing Provider  atorvastatin (LIPITOR) 40 MG tablet Take 40 mg by mouth at bedtime.     [provider]  clonazePAM (KLONOPIN) 0.5 MG tablet Take 0.5 mg by mouth 4 (four) times daily as needed for anxiety.    [provider]  gabapentin (NEURONTIN) 300 MG capsule Take 600 mg by mouth 3 (three) times daily.   04/03/17   [provider]  insulin glargine (LANTUS) 100 UNIT/ML injection Inject 0.32 mLs (32 Units total) into the skin daily. 02/03/18   Sheikh, Omair Latif, DO  insulin lispro (HUMALOG) 100 UNIT/ML injection Inject 0.08 mLs (8 Units total) into the skin 3 (three) times daily with meals. 02/03/18   Sheikh, Georgina Quint Latif, DO  lidocaine (LIDODERM) 5 % Place 1 patch onto the skin daily as needed (pain). Remove after 12 hours. 10/30/15   [provider]  rivaroxaban (XARELTO) 20 MG TABS tablet Take 1 tablet (20 mg total) by mouth daily with supper. 02/03/18  Sheikh, Omair Latif, DO  venlafaxine XR (EFFEXOR-XR) 37.5 MG 24 hr capsule Take 37.5 mg by mouth daily with breakfast.  10/02/17   [provider]    Allergies    Cephalexin, Ibuprofen, Meloxicam, and Reglan [metoclopramide]  Review of Systems   Review of Systems  Unable to perform ROS: Mental status change    Physical Exam Updated Vital Signs BP 121/76 (BP Location: Right Arm)   Pulse (!) 109   Temp 98.7 F (37.1 C) (Oral)   Resp 20   SpO2 100%   Physical Exam Vitals and nursing note reviewed.  Constitutional:      General: She is not in acute distress.    Appearance: Normal appearance. She is well-developed. She is not ill-appearing.     Comments: Non-verbal but moaning and restless in the stretcher. Eyes are open and she responds to sternal rub but will not respond by voice  HENT:     Head: Normocephalic and atraumatic.  Eyes:     General: No scleral icterus.       Right eye: No discharge.        Left eye: No discharge.     Conjunctiva/sclera: Conjunctivae normal.     Pupils: Pupils are equal, round, and reactive to light.  Cardiovascular:     Rate and Rhythm: Normal rate and regular rhythm.  Pulmonary:     Effort: Pulmonary effort is normal. No respiratory distress.     Breath sounds: Normal breath sounds.  Abdominal:     General: There is no distension.     Palpations: Abdomen is soft.      Tenderness: There is no abdominal tenderness.  Musculoskeletal:     Cervical back: Normal range of motion.  Skin:    General: Skin is warm and dry.  Neurological:     Mental Status: She is alert.  Psychiatric:        Mood and Affect: Mood is anxious.        Speech: She is noncommunicative.     ED Results / Procedures / Treatments   Labs (all labs ordered are listed, but only abnormal results are displayed) Labs Reviewed  BASIC METABOLIC PANEL - Abnormal; Notable for the following components:      Result Value   Sodium 132 (*)    Chloride 97 (*)    Glucose, Bld 491 (*)    Calcium 8.5 (*)    All other components within normal limits  BASIC METABOLIC PANEL - Abnormal; Notable for the following components:   Glucose, Bld 344 (*)    All other components within normal limits  CBC WITH DIFFERENTIAL/PLATELET - Abnormal; Notable for the following components:   WBC 14.3 (*)    Neutro Abs 12.3 (*)    All other components within normal limits  CBG MONITORING, ED - Abnormal; Notable for the following components:   Glucose-Capillary 507 (*)    All other components within normal limits  CBG MONITORING, ED - Abnormal; Notable for the following components:   Glucose-Capillary 459 (*)    All other components within normal limits  CBG MONITORING, ED - Abnormal; Notable for the following components:   Glucose-Capillary 515 (*)    All other components within normal limits  POCT I-STAT EG7 - Abnormal; Notable for the following components:   pO2, Ven 24.0 (*)    Bicarbonate 28.8 (*)    Acid-Base Excess 3.0 (*)    All other components within normal limits  SARS CORONAVIRUS 2 (TAT 6-24  HRS)  ETHANOL  BASIC METABOLIC PANEL  BASIC METABOLIC PANEL  BETA-HYDROXYBUTYRIC ACID  BETA-HYDROXYBUTYRIC ACID  URINALYSIS, ROUTINE W REFLEX MICROSCOPIC  RAPID URINE DRUG SCREEN, HOSP PERFORMED  BASIC METABOLIC PANEL  BETA-HYDROXYBUTYRIC ACID  PREGNANCY, URINE  I-STAT VENOUS BLOOD GAS, ED  TROPONIN I  (HIGH SENSITIVITY)  TROPONIN I (HIGH SENSITIVITY)    EKG EKG Interpretation  Date/Time:  Monday May 01 2019 18:44:11 EDT Ventricular Rate:  94 PR Interval:    QRS Duration: 82 QT Interval:  358 QTC Calculation: 448 R Axis:   93 Text Interpretation: Sinus rhythm Borderline right axis deviation When compared to prior, no significant changes seen. No STEMI Confirmed by Antony Blackbird [id number]) on 05/01/2019 6:52:38 PM   Radiology CT Head Wo Contrast  Result Date: 05/01/2019 CLINICAL DATA:  Hyperglycemia, altered level of consciousness EXAM: CT HEAD WITHOUT CONTRAST TECHNIQUE: Contiguous axial images were obtained from the base of the skull through the vertex without intravenous contrast. COMPARISON:  02/01/2018, 11/17/2014 FINDINGS: Brain: No acute infarct or hemorrhage. Lateral ventricles and midline structures are unremarkable. No acute extra-axial fluid collections. No mass effect. Evaluation slightly limited by patient motion during the exam. Vascular: No hyperdense vessel or unexpected calcification. Skull: Normal. Negative for fracture or focal lesion. Sinuses/Orbits: No acute finding. Other: None IMPRESSION: 1. No acute intracranial process. Electronically Signed   By: Randa Ngo M.D.   On: 05/01/2019 19:20   DG Chest Port 1 View  Result Date: 05/01/2019 CLINICAL DATA:  Chest pain, hyperglycemia EXAM: PORTABLE CHEST 1 VIEW COMPARISON:  11/16/2017 FINDINGS: The heart size and mediastinal contours are within normal limits. Both lungs are clear. The visualized skeletal structures are unremarkable. IMPRESSION: No active disease. Electronically Signed   By: Randa Ngo M.D.   On: 05/01/2019 19:27    Procedures Procedures (including critical care time)  CRITICAL CARE Performed by: Recardo Evangelist   Total critical care time: 35 minutes  Critical care time was exclusive of separately billable procedures and treating other patients.  Critical care was necessary to treat or  prevent imminent or life-threatening deterioration.  Critical care was time spent personally by me on the following activities: development of treatment plan with patient and/or surrogate as well as nursing, discussions with consultants, evaluation of patient's response to treatment, examination of patient, obtaining history from patient or surrogate, ordering and performing treatments and interventions, ordering and review of laboratory studies, ordering and review of radiographic studies, pulse oximetry and re-evaluation of patient's condition.   Medications Ordered in ED Medications  insulin regular, human (MYXREDLIN) 100 units/ 100 mL infusion (has no administration in time range)  0.9 %  sodium chloride infusion (has no administration in time range)  dextrose 5 %-0.45 % sodium chloride infusion (has no administration in time range)  dextrose 50 % solution 0-50 mL (has no administration in time range)  potassium chloride 10 mEq in 100 mL IVPB (has no administration in time range)  lactated ringers bolus 1,000 mL ( Intravenous Stopped 05/01/19 2115)  LORazepam (ATIVAN) injection 1 mg (1 mg Intravenous Given 05/01/19 1850)  insulin aspart (novoLOG) injection 10 Units (10 Units Intravenous Given 05/01/19 1923)    ED Course  I have reviewed the triage vital signs and the nursing notes.  Pertinent labs & imaging results that were available during my care of the patient were reviewed by me and considered in my medical decision making (see chart for details).  49 year old female presents with hyperglycemia and altered mental status.  She is  initially tachycardic but otherwise vital signs are normal.  She is noncommunicative on exam but will respond to sternal rub in yes or no questions.  Is difficult to get a detailed history.  History is mostly obtained from nursing staff and her niece.  Initial CBG is 507.  Will obtain labs, urine, CT head  CBC is remarkable for leukocytosis of 14.  BMP is  remarkable for glucose of 491 with mild hyponatremia which is likely pseudohyponatremia.  Her anion gap is normal and pH is 7.39 making DKA unlikely.  Her niece is at bedside now and states that the patient initially called EMS stating she felt like she was having a heart attack and she thinks that anxiety is playing a significant component.  We will add on EKG, chest x-ray, troponin  EKG is sinus rhythm.  Troponin is 3 and 4 respectively.  Chest x-ray is negative.  CT head is negative.  After fluids and 10 units of insulin her glucose has improved to 344 with repeat BMP.  Shared visit with Dr. Sherry Ruffing.  Patient is still not back to baseline.  Will admit for further management.  Discussed with Dr. Flossie Buffy who will admit.  She is requesting patient be started on insulin drip   MDM Rules/Calculators/A&P                       Final Clinical Impression(s) / ED Diagnoses Final diagnoses:  Hyperglycemia  Altered mental status, unspecified altered mental status type    Rx / DC Orders ED Discharge Orders    None       Recardo Evangelist, PA-C 05/01/19 2306    Tegeler, Gwenyth Allegra, MD 05/01/19 [phone]

## 2019-05-01 NOTE — ED Notes (Signed)
Spoke with niece for update on patient status.

## 2019-05-01 NOTE — ED Notes (Signed)
ED Provider at bedside. 

## 2019-05-01 NOTE — ED Notes (Signed)
IV attempt right AC unsuccessful RN aware.

## 2019-05-01 NOTE — Progress Notes (Addendum)
Transfer from [facility]  49 yo F with hx of Type 1 DM, PE/DVT on Xarelto, Migraine HA, TIA who presented with altered mental status and hyperglycemia.   Pt only able to answer yes or no questions but cannot provide much other history. ED PA spoke with Niece who called pt earlier and pt thought she was having a heart attack. Niece thought she was having a panic attack since pt has anxiety. Niece arrived at house and found EMS there and pt already had AMS.   She was mildly tachycardic, normotensive. Lab notable for BG around 492. PH of 7.399, Bicarb of 28, anion gap of 11. CT head negative. Alcohol level negative. CXR negative.  Pt has not provided urine sample to assess drug screen.   Has received 10 units of Novolog and 1L of LR with improve of BG to 344 . Also given 1mg  of Ativan. No improvement noted to her mental status.   Advised ED PA to start insulin gtt in order to better control glucose since she normally requires 30 units of Lantus and 8 units TID.   Will admit to SDU.

## 2019-05-01 NOTE — ED Notes (Signed)
Provider at bedside, made aware of need to change insulin order to preparation available in pyxis.  Attempting IV access for blood collection and IV fluid infusion.

## 2019-05-01 NOTE — ED Notes (Signed)
Notified RN

## 2019-05-01 NOTE — ED Triage Notes (Addendum)
PT from home via EMS for c/o high blood sugar. Pt moaning and twitching on arrival. Skin warm and dry. Vss. PT not verbally responding but is awake and alert. Not answering most triage questions. Airway intact. No obvious injury noted.

## 2019-05-02 ENCOUNTER — Emergency Department (HOSPITAL_COMMUNITY): Payer: Medicare Other

## 2019-05-02 ENCOUNTER — Inpatient Hospital Stay (HOSPITAL_BASED_OUTPATIENT_CLINIC_OR_DEPARTMENT_OTHER): Payer: Medicare Other

## 2019-05-02 DIAGNOSIS — R739 Hyperglycemia, unspecified: Secondary | ICD-10-CM | POA: Diagnosis present

## 2019-05-02 DIAGNOSIS — G9341 Metabolic encephalopathy: Secondary | ICD-10-CM | POA: Diagnosis present

## 2019-05-02 DIAGNOSIS — F4310 Post-traumatic stress disorder, unspecified: Secondary | ICD-10-CM | POA: Diagnosis present

## 2019-05-02 DIAGNOSIS — Z818 Family history of other mental and behavioral disorders: Secondary | ICD-10-CM | POA: Diagnosis not present

## 2019-05-02 DIAGNOSIS — M1712 Unilateral primary osteoarthritis, left knee: Secondary | ICD-10-CM | POA: Diagnosis present

## 2019-05-02 DIAGNOSIS — E1065 Type 1 diabetes mellitus with hyperglycemia: Secondary | ICD-10-CM | POA: Diagnosis present

## 2019-05-02 DIAGNOSIS — G8929 Other chronic pain: Secondary | ICD-10-CM

## 2019-05-02 DIAGNOSIS — Z90710 Acquired absence of both cervix and uterus: Secondary | ICD-10-CM | POA: Diagnosis not present

## 2019-05-02 DIAGNOSIS — E1042 Type 1 diabetes mellitus with diabetic polyneuropathy: Secondary | ICD-10-CM | POA: Diagnosis present

## 2019-05-02 DIAGNOSIS — M797 Fibromyalgia: Secondary | ICD-10-CM | POA: Diagnosis present

## 2019-05-02 DIAGNOSIS — D72829 Elevated white blood cell count, unspecified: Secondary | ICD-10-CM

## 2019-05-02 DIAGNOSIS — Z86718 Personal history of other venous thrombosis and embolism: Secondary | ICD-10-CM | POA: Diagnosis not present

## 2019-05-02 DIAGNOSIS — F329 Major depressive disorder, single episode, unspecified: Secondary | ICD-10-CM

## 2019-05-02 DIAGNOSIS — E10319 Type 1 diabetes mellitus with unspecified diabetic retinopathy without macular edema: Secondary | ICD-10-CM | POA: Diagnosis present

## 2019-05-02 DIAGNOSIS — Z7901 Long term (current) use of anticoagulants: Secondary | ICD-10-CM

## 2019-05-02 DIAGNOSIS — Z9049 Acquired absence of other specified parts of digestive tract: Secondary | ICD-10-CM | POA: Diagnosis not present

## 2019-05-02 DIAGNOSIS — E785 Hyperlipidemia, unspecified: Secondary | ICD-10-CM | POA: Diagnosis present

## 2019-05-02 DIAGNOSIS — F411 Generalized anxiety disorder: Secondary | ICD-10-CM | POA: Diagnosis present

## 2019-05-02 DIAGNOSIS — Z8249 Family history of ischemic heart disease and other diseases of the circulatory system: Secondary | ICD-10-CM | POA: Diagnosis not present

## 2019-05-02 DIAGNOSIS — G4733 Obstructive sleep apnea (adult) (pediatric): Secondary | ICD-10-CM | POA: Diagnosis present

## 2019-05-02 DIAGNOSIS — F419 Anxiety disorder, unspecified: Secondary | ICD-10-CM

## 2019-05-02 DIAGNOSIS — F1290 Cannabis use, unspecified, uncomplicated: Secondary | ICD-10-CM

## 2019-05-02 DIAGNOSIS — Z86711 Personal history of pulmonary embolism: Secondary | ICD-10-CM

## 2019-05-02 DIAGNOSIS — Z87442 Personal history of urinary calculi: Secondary | ICD-10-CM | POA: Diagnosis not present

## 2019-05-02 DIAGNOSIS — Z9089 Acquired absence of other organs: Secondary | ICD-10-CM | POA: Diagnosis not present

## 2019-05-02 DIAGNOSIS — F1210 Cannabis abuse, uncomplicated: Secondary | ICD-10-CM | POA: Diagnosis present

## 2019-05-02 DIAGNOSIS — I361 Nonrheumatic tricuspid (valve) insufficiency: Secondary | ICD-10-CM | POA: Diagnosis not present

## 2019-05-02 DIAGNOSIS — Z8673 Personal history of transient ischemic attack (TIA), and cerebral infarction without residual deficits: Secondary | ICD-10-CM | POA: Diagnosis not present

## 2019-05-02 DIAGNOSIS — Z20822 Contact with and (suspected) exposure to covid-19: Secondary | ICD-10-CM | POA: Diagnosis present

## 2019-05-02 DIAGNOSIS — Z833 Family history of diabetes mellitus: Secondary | ICD-10-CM | POA: Diagnosis not present

## 2019-05-02 LAB — BASIC METABOLIC PANEL
Anion gap: 8 (ref 5–15)
Anion gap: 9 (ref 5–15)
BUN: 10 mg/dL (ref 6–20)
BUN: 9 mg/dL (ref 6–20)
CO2: 28 mmol/L (ref 22–32)
CO2: 27 mmol/L (ref 22–32)
Calcium: 8.7 mg/dL — ABNORMAL LOW (ref 8.9–10.3)
Calcium: 9.1 mg/dL (ref 8.9–10.3)
Chloride: 106 mmol/L (ref 98–111)
Chloride: 103 mmol/L (ref 98–111)
Creatinine, Ser: 0.9 mg/dL (ref 0.44–1.00)
Creatinine, Ser: 0.95 mg/dL (ref 0.44–1.00)
GFR calc Af Amer: >60 mL/min (ref >60)
GFR calc Af Amer: >60 mL/min (ref >60)
GFR calc non Af Amer: >60 mL/min (ref >60)
GFR calc non Af Amer: >60 mL/min (ref >60)
Glucose, Bld: 209 mg/dL — ABNORMAL HIGH (ref 70–99)
Glucose, Bld: 151 mg/dL — ABNORMAL HIGH (ref 70–99)
Potassium: 3.3 mmol/L — ABNORMAL LOW (ref 3.5–5.1)
Potassium: 3.6 mmol/L (ref 3.5–5.1)
Sodium: 142 mmol/L (ref 135–145)
Sodium: 139 mmol/L (ref 135–145)

## 2019-05-02 LAB — CBG MONITORING, ED
Glucose-Capillary: 108 mg/dL — ABNORMAL HIGH (ref 70–99)
Glucose-Capillary: 109 mg/dL — ABNORMAL HIGH (ref 70–99)
Glucose-Capillary: 110 mg/dL — ABNORMAL HIGH (ref 70–99)
Glucose-Capillary: 120 mg/dL — ABNORMAL HIGH (ref 70–99)
Glucose-Capillary: 137 mg/dL — ABNORMAL HIGH (ref 70–99)
Glucose-Capillary: 137 mg/dL — ABNORMAL HIGH (ref 70–99)
Glucose-Capillary: 149 mg/dL — ABNORMAL HIGH (ref 70–99)
Glucose-Capillary: 164 mg/dL — ABNORMAL HIGH (ref 70–99)
Glucose-Capillary: 200 mg/dL — ABNORMAL HIGH (ref 70–99)
Glucose-Capillary: 211 mg/dL — ABNORMAL HIGH (ref 70–99)
Glucose-Capillary: 288 mg/dL — ABNORMAL HIGH (ref 70–99)
Glucose-Capillary: 297 mg/dL — ABNORMAL HIGH (ref 70–99)

## 2019-05-02 LAB — SARS CORONAVIRUS 2 (TAT 6-24 HRS): SARS Coronavirus 2: NEGATIVE

## 2019-05-02 LAB — PREGNANCY, URINE: Preg Test, Ur: NEGATIVE

## 2019-05-02 LAB — RAPID URINE DRUG SCREEN, HOSP PERFORMED
Amphetamines: NOT DETECTED
Barbiturates: NOT DETECTED
Benzodiazepines: NOT DETECTED
Cocaine: NOT DETECTED
Opiates: NOT DETECTED
Tetrahydrocannabinol: POSITIVE — AB

## 2019-05-02 LAB — URINALYSIS, ROUTINE W REFLEX MICROSCOPIC
Bilirubin Urine: NEGATIVE
Glucose, UA: >=500 mg/dL — AB
Hgb urine dipstick: NEGATIVE
Ketones, ur: 15 mg/dL — AB
Leukocytes,Ua: NEGATIVE
Nitrite: NEGATIVE
Protein, ur: NEGATIVE mg/dL
Specific Gravity, Urine: 1.01 (ref 1.005–1.030)
pH: 6 (ref 5.0–8.0)

## 2019-05-02 LAB — URINALYSIS, MICROSCOPIC (REFLEX)
RBC / HPF: NONE SEEN RBC/hpf (ref 0–5)
WBC, UA: NONE SEEN WBC/hpf (ref 0–5)

## 2019-05-02 LAB — BETA-HYDROXYBUTYRIC ACID
Beta-Hydroxybutyric Acid: 0.38 mmol/L — ABNORMAL HIGH (ref 0.05–0.27)
Beta-Hydroxybutyric Acid: 0.12 mmol/L (ref 0.05–0.27)

## 2019-05-02 MED ORDER — VENLAFAXINE HCL ER 37.5 MG PO CP24
37.5000 mg | ORAL_CAPSULE | Freq: Every day | ORAL | Status: DC
  Administered 2019-05-03: 37.5 mg via ORAL
  Filled 2019-05-02 (×4): qty 1

## 2019-05-02 MED ORDER — INSULIN ASPART 100 UNIT/ML ~~LOC~~ SOLN
4.0000 [IU] | Freq: Once | SUBCUTANEOUS | Status: AC
  Administered 2019-05-02: 4 [IU] via SUBCUTANEOUS

## 2019-05-02 MED ORDER — LORAZEPAM 2 MG/ML IJ SOLN
1.0000 mg | Freq: Once | INTRAMUSCULAR | Status: AC | PRN
  Administered 2019-05-02: 1 mg via INTRAVENOUS
  Filled 2019-05-02: qty 1

## 2019-05-02 MED ORDER — ESCITALOPRAM OXALATE 20 MG PO TABS
20.0000 mg | ORAL_TABLET | Freq: Every day | ORAL | Status: DC
  Administered 2019-05-02: 20 mg via ORAL
  Filled 2019-05-02: qty 2

## 2019-05-02 MED ORDER — POTASSIUM CHLORIDE CRYS ER 20 MEQ PO TBCR
20.0000 meq | EXTENDED_RELEASE_TABLET | Freq: Once | ORAL | Status: AC
  Administered 2019-05-02: 20 meq via ORAL
  Filled 2019-05-02: qty 1

## 2019-05-02 MED ORDER — INSULIN GLARGINE 100 UNIT/ML ~~LOC~~ SOLN
20.0000 [IU] | Freq: Every day | SUBCUTANEOUS | Status: DC
  Administered 2019-05-03: 20 [IU] via SUBCUTANEOUS
  Filled 2019-05-02: qty 0.2

## 2019-05-02 MED ORDER — IOHEXOL 350 MG/ML SOLN
100.0000 mL | Freq: Once | INTRAVENOUS | Status: AC | PRN
  Administered 2019-05-02: 100 mL via INTRAVENOUS

## 2019-05-02 MED ORDER — OXYCODONE HCL 5 MG PO TABS
5.0000 mg | ORAL_TABLET | ORAL | Status: DC | PRN
  Administered 2019-05-03 (×3): 10 mg via ORAL
  Filled 2019-05-02 (×3): qty 2

## 2019-05-02 MED ORDER — ATORVASTATIN CALCIUM 40 MG PO TABS
40.0000 mg | ORAL_TABLET | Freq: Every day | ORAL | Status: DC
  Administered 2019-05-02: 40 mg via ORAL
  Filled 2019-05-02: qty 1

## 2019-05-02 MED ORDER — INSULIN GLARGINE 100 UNIT/ML ~~LOC~~ SOLN
32.0000 [IU] | Freq: Every day | SUBCUTANEOUS | Status: DC
  Administered 2019-05-02: 20 [IU] via SUBCUTANEOUS
  Filled 2019-05-02: qty 1

## 2019-05-02 MED ORDER — INSULIN ASPART 100 UNIT/ML ~~LOC~~ SOLN
8.0000 [IU] | Freq: Three times a day (TID) | SUBCUTANEOUS | Status: DC
  Administered 2019-05-02 – 2019-05-03 (×4): 8 [IU] via SUBCUTANEOUS
  Filled 2019-05-02: qty 1

## 2019-05-02 MED ORDER — ACETAMINOPHEN 650 MG RE SUPP: 650.0000 mg | Freq: Four times a day (QID) | RECTAL | Status: DC | PRN

## 2019-05-02 MED ORDER — GABAPENTIN 300 MG PO CAPS
600.0000 mg | ORAL_CAPSULE | Freq: Three times a day (TID) | ORAL | Status: DC
  Administered 2019-05-02 – 2019-05-03 (×3): 600 mg via ORAL
  Filled 2019-05-02: qty 6
  Filled 2019-05-02 (×2): qty 2

## 2019-05-02 MED ORDER — ACETAMINOPHEN 325 MG PO TABS
650.0000 mg | ORAL_TABLET | Freq: Once | ORAL | Status: AC
  Administered 2019-05-02: 650 mg via ORAL
  Filled 2019-05-02: qty 2

## 2019-05-02 MED ORDER — ONDANSETRON HCL 4 MG/2ML IJ SOLN: 4.0000 mg | Freq: Four times a day (QID) | INTRAMUSCULAR | Status: DC | PRN

## 2019-05-02 MED ORDER — ONDANSETRON HCL 4 MG PO TABS: 4.0000 mg | ORAL_TABLET | Freq: Four times a day (QID) | ORAL | Status: DC | PRN

## 2019-05-02 MED ORDER — ALBUTEROL SULFATE (2.5 MG/3ML) 0.083% IN NEBU: 2.5000 mg | INHALATION_SOLUTION | Freq: Four times a day (QID) | RESPIRATORY_TRACT | Status: DC | PRN

## 2019-05-02 MED ORDER — SODIUM CHLORIDE 0.9% FLUSH
3.0000 mL | Freq: Two times a day (BID) | INTRAVENOUS | Status: DC
  Administered 2019-05-02 – 2019-05-03 (×3): 3 mL via INTRAVENOUS

## 2019-05-02 MED ORDER — DIPHENHYDRAMINE HCL 25 MG PO CAPS: 25.0000 mg | ORAL_CAPSULE | Freq: Four times a day (QID) | ORAL | Status: DC | PRN

## 2019-05-02 MED ORDER — POTASSIUM CHLORIDE CRYS ER 20 MEQ PO TBCR: 20.0000 meq | EXTENDED_RELEASE_TABLET | Freq: Two times a day (BID) | ORAL | Status: DC

## 2019-05-02 MED ORDER — ACETAMINOPHEN 325 MG PO TABS: 650.0000 mg | ORAL_TABLET | Freq: Four times a day (QID) | ORAL | Status: DC | PRN

## 2019-05-02 MED ORDER — RIVAROXABAN 20 MG PO TABS
20.0000 mg | ORAL_TABLET | Freq: Every day | ORAL | Status: DC
  Administered 2019-05-02 – 2019-05-03 (×2): 20 mg via ORAL
  Filled 2019-05-02 (×2): qty 1

## 2019-05-02 MED ORDER — PROCHLORPERAZINE EDISYLATE 10 MG/2ML IJ SOLN: 10.0000 mg | Freq: Once | INTRAMUSCULAR | Status: DC

## 2019-05-02 NOTE — H&P (Addendum)
History and Physical    Maureen [name]:[id number] DOB: [birth year] DOA: 05/01/2019  Referring MD/NP/PA: Ileene Musa, MD PCP: System, Pcp Not In  Patient coming from: [facility] transfer  Chief Complaint: Altered  I have personally briefly reviewed patient's old medical records in [place]   HPI: Maureen [name] is a 49 y.o. female with medical history significant of DM type I, PE/DVT on Xarelto, fibromyalgia, and TIA presented presented after being found to be acutely altered.  It was reported that patient was initially unable to answer questions or follow commands initial examination.  Her niece that was present reported that she had called her and stated that she felt like she was going to die.  Patient is back to baseline at this time reports that she is from [place] and that is where she has all of her primary care providers.  She came down to [place] to get her Covid vaccine as well as visit her son.  Reports having her first Covid vaccine on April 1 or 2 and thereafter having some fever and generalized malaise.  At baseline patient has neuropathy and other medical conditions for which she is not gabapentin and oxycodone for pain.  She notes that she had recently misplaced her prescription for oxycodone.  She tried substituting a edible to see if it helps relieve them her pain and neuropathy symptoms.  Later on in the day patient had acute onset of tingling in her head and the rest of her body.  She feared that her blood sugars were low, and drink a soda to try and raise them.  She was able to check her blood sugars at that time and tried to call for help.  Notes associated symptoms of palpitations and change in speech.  Patient notes that she is no longer on 32 units of insulin and currently takes  ED Course: Upon arrival at [facility] patient was noted to be tachycardic and tachypneic.  4/12 significant for WBC 14.3, sodium 132, CO2 28, glucose 491, venous pH  7.399, and anion gap 11.  Alcohol level was negative and urine drug screen positive for marijuana.  Chest x-ray showed no acute abnormalities.  Patient had been given 10 units of NovoLog insulin,   1 L of lactated Ringer's, Ativan initially.  The ED provider and started on insulin drip.  Initial orders have been placed for patient to go to stepdown.  However, patient was transitioned off the drip back to her home insulin regimen.  Patient still noted to have abnormal speech for which CT angiogram of the head neck was performed, but did not reveal any acute abnormalities.  Transferred ED to ED due to prolonged wait for need of MRI.  Review of Systems  Constitutional: Positive for malaise/fatigue. Negative for fever.  HENT: Negative for ear discharge and nosebleeds.   Eyes: Negative for photophobia and pain.  Respiratory: Negative for cough and shortness of breath.   Cardiovascular: Positive for palpitations. Negative for chest pain and leg swelling.  Gastrointestinal: Negative for nausea and vomiting.  Genitourinary: Negative for dysuria and hematuria.  Musculoskeletal: Negative for joint pain and myalgias.  Neurological: Positive for sensory change and speech change. Negative for loss of consciousness.  Endo/Heme/Allergies: Negative for environmental allergies and polydipsia.  Psychiatric/Behavioral: Positive for substance abuse.    Past Medical History:  Diagnosis Date  . Abdominal pain   . Achilles tendon contracture, left    with Lisfranc fracture  . Anxiety   .  Arthritis   . Biliary dyskinesia   . Cervical strain   . Closed head injury 2010  . Depression   . DVT (deep venous thrombosis) ([place])   . Elevated LFTs   . Fibromyalgia   . GERD (gastroesophageal reflux disease)   . History of kidney stones   . Hyperlipidemia   . Hypoglycemia   . Joint pain   . Major depressive disorder   . Migraine    hx of none recent  . Nausea & vomiting   . OSA on CPAP 05/14/2016   Moderate to  severe OSA with an AHI of 28/hr now on CPAP at 19cm H2O  . Pneumonia   . Post traumatic stress disorder (PTSD)   . Pulmonary embolism ([place])   . Renal stones   . Skin rash    due to medications  . Sleep trouble   . Stroke [facility])    " series of mini strokes around 2007"  . TIA (transient ischemic attack)   . Wears glasses   . Well controlled type 1 diabetes mellitus with peripheral neuropathy ([place])    Type 1    Past Surgical History:  Procedure Laterality Date  . ABDOMINAL HYSTERECTOMY  2001  . APPENDECTOMY    . [place]  . CHOLECYSTECTOMY  02/10/2011   Procedure: LAPAROSCOPIC CHOLECYSTECTOMY WITH INTRAOPERATIVE CHOLANGIOGRAM;  Surgeon: Judieth Keens, DO;  Location: [facility];  Service: General;  Laterality: N/A;  . COLONOSCOPY    . DILATION AND CURETTAGE OF UTERUS    . exploratory laparomty  Mar 04, 2012  . EYE SURGERY  2006   laser eye surgery left eye  . KNEE SURGERY Left 2012  . LAPAROSCOPIC APPENDECTOMY N/A 10/12/2012   Procedure: DIAGNOSTIC APPENDECTOMY LAPAROSCOPIC;  Surgeon: Madilyn Hook, DO;  Location: [facility];  Service: General;  Laterality: N/A;  . lipoma removed  yrs ago  . LIVER BIOPSY    . ORIF ANKLE FRACTURE Left 01/03/2016   Procedure: OPEN REDUCTION INTERNAL FIXATION (ORIF) LEFT LISFRANC JOINT, GASTROC RECESSION;  Surgeon: Newt Minion, MD;  Location: [facility];  Service: Orthopedics;  Laterality: Left;  . ROBOTIC ASSISTED LAPAROSCOPIC LYSIS OF ADHESION N/A 03/04/2012   Procedure: ROBOTIC ASSISTED LAPAROSCOPIC LYSIS OF EXTENSIVE ADHESIONS;  Surgeon: Claiborne Billings A. Pamala Hurry, MD;  Location: [facility];  Service: Gynecology;  Laterality: N/A;  . TONSILLECTOMY  2001     reports that she has never smoked. She has never used smokeless tobacco. She reports current alcohol use. She reports that she does not use drugs.  Allergies  Allergen Reactions  . Cephalexin Anaphylaxis, Shortness Of Breath, Rash and Other (See Comments)    Pt was admitted to the hospital  upon taking.   . Ibuprofen Nausea And Vomiting and Rash  . Meloxicam Nausea Only  . Reglan [Metoclopramide] Anxiety    Family History  Problem Relation Age of Onset  . Cancer Father        lymphoma  . Nephrolithiasis Father   . Vascular Disease Father   . Alcohol abuse Father   . Drug abuse Father   . Cancer Maternal Grandmother        colon  . Hypertension Mother   . Heart disease Mother   . Cataracts Mother   . Depression Mother   . Diabetes Brother   . Drug abuse Brother   . Mental illness Maternal Grandfather   . Cancer Maternal Grandfather   . Heart disease Paternal Grandmother   . Heart disease  Paternal Grandfather   . Suicidality Maternal Uncle     Prior to Admission medications   Medication Sig Start Date End Date Taking? Authorizing Provider  acetaminophen (TYLENOL) 500 MG tablet Take 1,000 mg by mouth every 6 (six) hours as needed for mild pain.   Yes [provider]  atorvastatin (LIPITOR) 40 MG tablet Take 40 mg by mouth at bedtime.    Yes [provider]  diphenhydrAMINE (DIPHENHIST) 25 MG tablet Take 25 mg by mouth every 6 (six) hours as needed for allergies.   Yes [provider]  escitalopram (LEXAPRO) 20 MG tablet Take 20 mg by mouth at bedtime.   Yes [provider]  gabapentin (NEURONTIN) 300 MG capsule Take 600 mg by mouth 3 (three) times daily.  04/03/17  Yes [provider]  insulin glargine (LANTUS) 100 UNIT/ML injection Inject 0.32 mLs (32 Units total) into the skin daily. Patient taking differently: Inject 20 Units into the skin daily.  02/03/18  Yes Sheikh, Omair Latif, DO  insulin lispro (HUMALOG) 100 UNIT/ML injection Inject 0.08 mLs (8 Units total) into the skin 3 (three) times daily with meals. 02/03/18  Yes Sheikh, Omair Latif, DO  oxyCODONE (OXY IR/ROXICODONE) 5 MG immediate release tablet Take 5-10 mg by mouth every 4 (four) hours as needed for pain. 04/17/19  Yes [provider]  rivaroxaban  (XARELTO) 20 MG TABS tablet Take 1 tablet (20 mg total) by mouth daily with supper. 02/03/18  Yes Sheikh, [place], DO  venlafaxine XR (EFFEXOR-XR) 37.5 MG 24 hr capsule Take 37.5 mg by mouth daily with breakfast.  10/02/17   [provider]    Physical Exam:  Constitutional: Middle-aged female currently in NAD, calm, comfortable Vitals:   05/02/19 1030 05/02/19 1100 05/02/19 1130 05/02/19 1200  BP: 129/67 135/73 (!) 149/74 (!) 150/73  Pulse: 78 77 71 73  Resp: _0 (!) 21  Temp:      TempSrc:      SpO2: 98% 98% 99% 100%   Eyes: PERRL, lids and conjunctivae normal ENMT: Mucous membranes are moist. Posterior pharynx clear of any exudate or lesions.poor dentition with multiple dental caries. Neck: normal, supple, no masses, no thyromegaly Respiratory: clear to auscultation bilaterally, no wheezing, no crackles. Normal respiratory effort. No accessory muscle use.  Cardiovascular: Regular rate and rhythm, no murmurs / rubs / gallops. No extremity edema. 2+ pedal pulses. No carotid bruits.  Abdomen: no tenderness, no masses palpated. No hepatosplenomegaly. Bowel sounds positive.  Musculoskeletal: no clubbing / cyanosis. No joint deformity upper and lower extremities. Good ROM, no contractures. Normal muscle tone.  Skin: no rashes, lesions, ulcers. No induration Neurologic: CN 2-12 grossly intact. Sensation intact, DTR normal. Strength 5/5 in all 4.  Psychiatric: Normal judgment and insight. Alert and oriented x 3.  Anxious mood.     Labs on Admission: I have personally reviewed following labs and imaging studies  CBC: Recent Labs  Lab 05/01/19 1825 05/01/19 1900  WBC 14.3*  --   NEUTROABS 12.3*  --   HGB 14.3 14.6  HCT 42.2 43.0  MCV 95.3  --   PLT 330  --    Basic Metabolic Panel: Recent Labs  Lab 05/01/19 1825 05/01/19 1900 05/01/19 2119 05/02/19 0222 05/02/19 1050  NA 132* 136 138 142 139  K 4.8 5.0 4.0 3.3* 3.6  CL 97*  --  100 106 103  CO2 24  --   _1 GLUCOSE 491*  --  344*  209* 151*  BUN 10  --  _0 CREATININE 0.97  --  0.95 0.90 0.95  CALCIUM 8.5*  --  9.0 8.7* 9.1   GFR: CrCl cannot be calculated (Unknown ideal weight.). Liver Function Tests: No results for input(s): AST, ALT, ALKPHOS, BILITOT, PROT, ALBUMIN in the last 168 hours. No results for input(s): LIPASE, AMYLASE in the last 168 hours. No results for input(s): AMMONIA in the last 168 hours. Coagulation Profile: No results for input(s): INR, PROTIME in the last 168 hours. Cardiac Enzymes: No results for input(s): CKTOTAL, CKMB, CKMBINDEX, TROPONINI in the last 168 hours. BNP (last 3 results) No results for input(s): PROBNP in the last 8760 hours. HbA1C: No results for input(s): HGBA1C in the last 72 hours. CBG: Recent Labs  Lab 05/02/19 0726 05/02/19 0757 05/02/19 0914 05/02/19 1046 05/02/19 1210  GLUCAP 110* 108* 164* 149* 137*   Lipid Profile: No results for input(s): CHOL, HDL, LDLCALC, TRIG, CHOLHDL, LDLDIRECT in the last 72 hours. Thyroid Function Tests: No results for input(s): TSH, T4TOTAL, FREET4, T3FREE, THYROIDAB in the last 72 hours. Anemia Panel: No results for input(s): VITAMINB12, FOLATE, FERRITIN, TIBC, IRON, RETICCTPCT in the last 72 hours. Urine analysis:    Component Value Date/Time   COLORURINE YELLOW 05/02/2019 0010   APPEARANCEUR CLEAR 05/02/2019 0010   LABSPEC 1.010 05/02/2019 0010   PHURINE 6.0 05/02/2019 0010   GLUCOSEU >=500 (A) 05/02/2019 0010   HGBUR NEGATIVE 05/02/2019 0010   BILIRUBINUR NEGATIVE 05/02/2019 0010   BILIRUBINUR negative 01/17/2015 1357   BILIRUBINUR small 07/18/2013 1817   KETONESUR 15 (A) 05/02/2019 0010   PROTEINUR NEGATIVE 05/02/2019 0010   UROBILINOGEN 1.0 01/17/2015 1357   UROBILINOGEN 0.2 10/25/2014 0927   NITRITE NEGATIVE 05/02/2019 0010   LEUKOCYTESUR NEGATIVE 05/02/2019 0010   Sepsis Labs: No results found for this or any previous visit (from the past 240 hour(s)).   Radiological  Exams on Admission: CT Angio Head W or Wo Contrast  Result Date: 05/02/2019 CLINICAL DATA:  Stroke, follow-up. EXAM: CT ANGIOGRAPHY HEAD AND NECK TECHNIQUE: Multidetector CT imaging of the head and neck was performed using the standard protocol during bolus administration of intravenous contrast. Multiplanar CT image reconstructions and MIPs were obtained to evaluate the vascular anatomy. Carotid stenosis measurements (when applicable) are obtained utilizing NASCET criteria, using the distal internal carotid diameter as the denominator. CONTRAST:  164m OMNIPAQUE IOHEXOL 350 MG/ML SOLN COMPARISON:  Noncontrast head CT 05/01/2019, brain MRI 02/01/2018, CT angiogram head/neck 01/31/2018 FINDINGS: CT HEAD FINDINGS Brain: There is no evidence of acute intracranial hemorrhage, intracranial mass, midline shift or extra-axial fluid collection.No demarcated cortical infarction. Cerebral volume is normal. Vascular: No hyperdense vessel.  Atherosclerotic calcifications. Skull: Normal. Negative for fracture or focal lesion. Sinuses: No significant paranasal sinus disease or mastoid effusion. Orbits: Visualized orbits demonstrate no acute abnormality. Review of the MIP images confirms the above findings CTA NECK FINDINGS Aortic arch: Common origin of the innominate and left common carotid arteries. The visualized aortic arch is unremarkable. No significant innominate or proximal subclavian artery stenosis. Right carotid system: CCA and ICA patent within the neck without significant stenosis (50% or greater). Mild mixed plaque within the carotid bifurcation and proximal ICA. Left carotid system: CCA and ICA patent within the neck without significant stenosis (50% or greater). Mild calcified plaque within the carotid bifurcation and proximal ICA. Vertebral arteries: The vertebral arteries are codominant and patent within the neck without significant stenosis. Skeleton: No acute bony abnormality or aggressive osseous lesion.  Other neck: No neck mass or cervical lymphadenopathy. Upper chest: No consolidation within the imaged lung apices. Review of the MIP images confirms the above findings CTA HEAD FINDINGS Anterior circulation: The intracranial internal carotid arteries are patent. Mild calcified plaque within these vessels without significant stenosis. The M1 middle cerebral arteries are patent without significant stenosis. No M2 proximal branch occlusion or high-grade proximal stenosis is identified. Hypoplastic A1 right ACA. The anterior cerebral arteries are patent without high-grade proximal stenosis. No intracranial aneurysm is identified. Posterior circulation: The intracranial vertebral arteries are patent without significant stenosis, as is the basilar artery. The bilateral posterior cerebral arteries are patent without significant proximal stenosis. Posterior communicating arteries are poorly delineated and may be hypoplastic or absent bilaterally. Venous sinuses: Within limitations of contrast timing, no convincing thrombus. Anatomic variants: As described Review of the MIP images confirms the above findings IMPRESSION: CT head: Unremarkable non-contrast CT appearance of the brain. No evidence of acute intracranial abnormality. CTA neck: The bilateral common carotid, internal carotid and vertebral arteries are patent within the neck without significant stenosis. Mild atherosclerotic plaque within the carotid bifurcations and proximal ICAs bilaterally. CTA head: No intracranial large vessel occlusion or proximal high-grade arterial stenosis. Electronically Signed   By: Kellie Simmering DO   On: 05/02/2019 09:45   CT Head Wo Contrast  Result Date: 05/01/2019 CLINICAL DATA:  Hyperglycemia, altered level of consciousness EXAM: CT HEAD WITHOUT CONTRAST TECHNIQUE: Contiguous axial images were obtained from the base of the skull through the vertex without intravenous contrast. COMPARISON:  02/01/2018, 11/17/2014 FINDINGS: Brain: No  acute infarct or hemorrhage. Lateral ventricles and midline structures are unremarkable. No acute extra-axial fluid collections. No mass effect. Evaluation slightly limited by patient motion during the exam. Vascular: No hyperdense vessel or unexpected calcification. Skull: Normal. Negative for fracture or focal lesion. Sinuses/Orbits: No acute finding. Other: None IMPRESSION: 1. No acute intracranial process. Electronically Signed   By: Randa Ngo M.D.   On: 05/01/2019 19:20   CT Angio Neck W and/or Wo Contrast  Result Date: 05/02/2019 CLINICAL DATA:  Stroke, follow-up. EXAM: CT ANGIOGRAPHY HEAD AND NECK TECHNIQUE: Multidetector CT imaging of the head and neck was performed using the standard protocol during bolus administration of intravenous contrast. Multiplanar CT image reconstructions and MIPs were obtained to evaluate the vascular anatomy. Carotid stenosis measurements (when applicable) are obtained utilizing NASCET criteria, using the distal internal carotid diameter as the denominator. CONTRAST:  1110m OMNIPAQUE IOHEXOL 350 MG/ML SOLN COMPARISON:  Noncontrast head CT 05/01/2019, brain MRI 02/01/2018, CT angiogram head/neck 01/31/2018 FINDINGS: CT HEAD FINDINGS Brain: There is no evidence of acute intracranial hemorrhage, intracranial mass, midline shift or extra-axial fluid collection.No demarcated cortical infarction. Cerebral volume is normal. Vascular: No hyperdense vessel.  Atherosclerotic calcifications. Skull: Normal. Negative for fracture or focal lesion. Sinuses: No significant paranasal sinus disease or mastoid effusion. Orbits: Visualized orbits demonstrate no acute abnormality. Review of the MIP images confirms the above findings CTA NECK FINDINGS Aortic arch: Common origin of the innominate and left common carotid arteries. The visualized aortic arch is unremarkable. No significant innominate or proximal subclavian artery stenosis. Right carotid system: CCA and ICA patent within the neck  without significant stenosis (50% or greater). Mild mixed plaque within the carotid bifurcation and proximal ICA. Left carotid system: CCA and ICA patent within the neck without significant stenosis (50% or greater). Mild calcified plaque within the carotid bifurcation and proximal ICA. Vertebral arteries: The vertebral arteries are codominant and patent within the neck  without significant stenosis. Skeleton: No acute bony abnormality or aggressive osseous lesion. Other neck: No neck mass or cervical lymphadenopathy. Upper chest: No consolidation within the imaged lung apices. Review of the MIP images confirms the above findings CTA HEAD FINDINGS Anterior circulation: The intracranial internal carotid arteries are patent. Mild calcified plaque within these vessels without significant stenosis. The M1 middle cerebral arteries are patent without significant stenosis. No M2 proximal branch occlusion or high-grade proximal stenosis is identified. Hypoplastic A1 right ACA. The anterior cerebral arteries are patent without high-grade proximal stenosis. No intracranial aneurysm is identified. Posterior circulation: The intracranial vertebral arteries are patent without significant stenosis, as is the basilar artery. The bilateral posterior cerebral arteries are patent without significant proximal stenosis. Posterior communicating arteries are poorly delineated and may be hypoplastic or absent bilaterally. Venous sinuses: Within limitations of contrast timing, no convincing thrombus. Anatomic variants: As described Review of the MIP images confirms the above findings IMPRESSION: CT head: Unremarkable non-contrast CT appearance of the brain. No evidence of acute intracranial abnormality. CTA neck: The bilateral common carotid, internal carotid and vertebral arteries are patent within the neck without significant stenosis. Mild atherosclerotic plaque within the carotid bifurcations and proximal ICAs bilaterally. CTA head: No  intracranial large vessel occlusion or proximal high-grade arterial stenosis. Electronically Signed   By: Kellie Simmering DO   On: 05/02/2019 09:45   DG Chest Port 1 View  Result Date: 05/01/2019 CLINICAL DATA:  Chest pain, hyperglycemia EXAM: PORTABLE CHEST 1 VIEW COMPARISON:  11/16/2017 FINDINGS: The heart size and mediastinal contours are within normal limits. Both lungs are clear. The visualized skeletal structures are unremarkable. IMPRESSION: No active disease. Electronically Signed   By: Randa Ngo M.D.   On: 05/01/2019 19:27    EKG: Independently reviewed.  Sinus rhythm at 94 bpm with  Assessment/Plan Acute metabolic encephalopathy: Patient presented to the outside facility after being found to be acutely altered. Noted to not be able to talk or follow commands, but those symptoms have resolved.  Patient reported having tingling sensation from her head all over her body reported palpitation.  Initially questioned the possibility of a TIA/stroke.  CT of the brain without contrast did not show any acute abnormalities and subsequent CTA of the head neck did not show any large vessel occlusion.  Urine drug screen was positive for marijuana.  Also on the differential includes the possibility of a seizure vs. drug. -Admit to a medical telemetry bed -Neurochecks -Check MRI of the brain  -Check echocardiogram -Follow-up telemetry overnight  Diabetes mellitus type 1 with hyperglycemia: Patient initially presented with blood glucose elevated at 492, but patient reported drinking soda as she thought her symptoms were secondary to blood sugars being low.  Last hemoglobin A1c noted to be 10.4 on 02/01/2018.  Home insulin regimen includes Lantus 20 units units daily NovoLog insulin 8 units 3 times daily. -Hypoglycemic protocols -Continue gabapentin -Continue home regimen of Lantus and NovoLog -CBGs before every meals and nightly  Leukocytosis: Acute.  WBC elevated 14.3 and initially met SIRS  criteria.  No initial lactic acid was obtained and no clear source of infection appreciated.  Suspect likely reactive in nature. -Recheck CBC in a.m.  Chronic pain: Patient reports being on oxycodone 5 to 10 mg every 4 hours as needed.  However noted that she cannot find her prescription bottle since traveling from [place]. -Can continue oxycodone while here in the hospital  Marijuana use: UDS positive for marijuana.  Patient reported eating an  edible. -Discussed that this could be a possible cause of her symptoms.  Hyperlipidemia -Continue Lipitor  History of DVT/PE on chronic anticoagulation -Continue Xarelto  Anxiety and depression -Continue Lexapro and Effexor  OSA -CPAP nightly  Marijuana use -Discussed with the patient the possibility of eating edibles as a cause of her symptoms.  DVT prophylaxis: xarelto Code Status: Full Family Communication: No Family requested to be updated at this time Disposition Plan: Possible discharge home for work-up negative Consults called: None Admission status: Inpatient, patient stay requiring 2 midnights. Norval Morton MD [facility] Pager [phone]   If 7PM-7AM, please contact night-coverage [email or web address] Password [facility]  05/02/2019, 2:22 PM

## 2019-05-02 NOTE — ED Provider Notes (Signed)
Patient is holding here overnight for hyperglycemia and altered mental status.  Patient's blood sugars have now been in the 100s for hours now and insulin drip and D5 drip were discontinued.  Patient was not in DKA prior to this.  Patient is now awake and alert but is complaining of headache, facial tingling and states her speech does not seem normal.  Given patient's prior history of TIA, not back to baseline despite normal sugars and reassuring labs feel she still needs work-up to ensure no new stroke causing symptoms.  Discussed with the hospitalist that patient does not need to be a stepdown patient but could be a floor patient and he rereported talking with [facility] as patient most likely needs more neurologic evaluation and he would not want her to be transferred to [place] and then have to go to [facility] if there was abnormality.  Head CT without acute findings today.   Blanchie Dessert, MD 05/02/19 1521

## 2019-05-02 NOTE — ED Notes (Signed)
Pt given oatmeal and water.  

## 2019-05-02 NOTE — ED Notes (Signed)
Pt transferred from [place] for MRI. Pt A&O, NAD noted.

## 2019-05-02 NOTE — ED Notes (Signed)
Pt following commands. Sleepy and making grunting noises when asked yes or no questions.

## 2019-05-02 NOTE — ED Provider Notes (Signed)
Patient transferred from [facility].  On my exam she is alert and oriented.  Complains of a 8 out of 10 headache.  States sometimes hard to get her speech out.  She was sent over for admission but sent over now because of no beds and the need for MRI.  I discussed with Dr. Fuller Plan, who is aware of her being transferred. Asked for his evaluation.   Sherwood Gambler, MD 05/02/19 1311

## 2019-05-02 NOTE — ED Notes (Signed)
Patient transported to CT 

## 2019-05-02 NOTE — ED Notes (Signed)
Spoke to Radiology scheduler for MRI.  She transferred me to MRI.  Spoke to MRI, they stated that they were double booked for MRI's.  Pt will not be able to have MRI until 05/03/2018

## 2019-05-02 NOTE — Progress Notes (Signed)
Patient refused CPAP at this time. Patient aware and understand to let staff RN/Rt know if she would like to use CPAP.

## 2019-05-02 NOTE — ED Notes (Signed)
Pt states that she only takes 20 units of Lantus, MD Plunkett made aware advises to only give pt 20 units of Lantus.   Pt also updated about plan to go [name] for treatment.

## 2019-05-02 NOTE — ED Notes (Signed)
Plunkett MD made aware of pt repeated CBGs below 150, advise to DC insulin drip and dextrose. MD advises they will look at home meds to switch patient to.

## 2019-05-02 NOTE — ED Notes (Signed)
Floor coverage MD paged for pt's hyperglycemia. Orders to check glucose before bed but no coverage ordered. Awaiting orders

## 2019-05-02 NOTE — Plan of Care (Signed)
Update patient presented for AMS and hyperglycemia.  Insulin drip discontinued as patient was never in DKA.  Bed change from progressive to telemetry.  ED physician notes that this a.m. patient speech is still slowed and purposeful.  Still recommend need for admission to work-up for TIA/stroke.  She continued to complain of a headache that she does not localize and tingling in her face.  Patient was given additional 20 mEq of potassium chloride p.o.  ED physician to order a CTA of the head and neck in the meantime until she is able to have MRI once transferred.

## 2019-05-02 NOTE — ED Notes (Signed)
Pt transferred to MRI

## 2019-05-03 ENCOUNTER — Inpatient Hospital Stay (HOSPITAL_COMMUNITY): Payer: Medicare Other

## 2019-05-03 ENCOUNTER — Encounter (HOSPITAL_COMMUNITY): Payer: Self-pay | Admitting: Internal Medicine

## 2019-05-03 DIAGNOSIS — I361 Nonrheumatic tricuspid (valve) insufficiency: Secondary | ICD-10-CM

## 2019-05-03 LAB — BASIC METABOLIC PANEL
Anion gap: 10 (ref 5–15)
BUN: 9 mg/dL (ref 6–20)
CO2: 23 mmol/L (ref 22–32)
Calcium: 8.9 mg/dL (ref 8.9–10.3)
Chloride: 103 mmol/L (ref 98–111)
Creatinine, Ser: 0.82 mg/dL (ref 0.44–1.00)
GFR calc Af Amer: >60 mL/min (ref >60)
GFR calc non Af Amer: >60 mL/min (ref >60)
Glucose, Bld: 294 mg/dL — ABNORMAL HIGH (ref 70–99)
Potassium: 4.2 mmol/L (ref 3.5–5.1)
Sodium: 136 mmol/L (ref 135–145)

## 2019-05-03 LAB — ECHOCARDIOGRAM COMPLETE
Height: 64 in
Weight: 2677.27 oz

## 2019-05-03 LAB — CBC
HCT: 40.7 % (ref 36.0–46.0)
Hemoglobin: 13.3 g/dL (ref 12.0–15.0)
MCH: 31.4 pg (ref 26.0–34.0)
MCHC: 32.7 g/dL (ref 30.0–36.0)
MCV: 96.2 fL (ref 80.0–100.0)
Platelets: 311 10*3/uL (ref 150–400)
RBC: 4.23 MIL/uL (ref 3.87–5.11)
RDW: 12.2 % (ref 11.5–15.5)
WBC: 10 10*3/uL (ref 4.0–10.5)
nRBC: 0 % (ref 0.0–0.2)

## 2019-05-03 LAB — HEMOGLOBIN A1C
Hgb A1c MFr Bld: 11.7 % — ABNORMAL HIGH (ref 4.8–5.6)
Mean Plasma Glucose: 289 mg/dL

## 2019-05-03 LAB — GLUCOSE, CAPILLARY
Glucose-Capillary: 187 mg/dL — ABNORMAL HIGH (ref 70–99)
Glucose-Capillary: 263 mg/dL — ABNORMAL HIGH (ref 70–99)
Glucose-Capillary: 277 mg/dL — ABNORMAL HIGH (ref 70–99)

## 2019-05-03 MED ORDER — BLOOD GLUCOSE METER KIT: PACK | 0 refills | Status: AC

## 2019-05-03 MED ORDER — RIVAROXABAN 20 MG PO TABS: 20.0000 mg | ORAL_TABLET | Freq: Every day | ORAL | 0 refills | Status: DC

## 2019-05-03 NOTE — TOC Transition Note (Signed)
Transition of Care [facility]) - CM/SW Discharge Note   Patient Details  Name: KATHERINA BALLOG MRN: [id number] Date of Birth: [birth year]  Transition of Care [facility]) CM/SW Contact:  Maryclare Labrador, RN Phone Number: 05/03/2019, 1:24 PM  Clinical Narrative:   Pt deemed stable for discharge home today.  Pt recently moved to [place] from [place].  Pt interested in [facility] for establishment of care - see AVS.   Pt informed CM that she has had a hardship with current copay of $400 for Xarelto with insurance, pt was not taking drug PTA even though it was prescribed.  Pt informed CM that while she lived in [place] she was able to get the local hospital ED to fill monthly for zero cost.  Pt also informed CM that she has applied for the medication assistance with Xarelto and declined due to income - pt confirms that her financial situation has not changed since she applied.   CM submitted benefit check - pt acknowledges the requirement to satisfy the deductible before having the ongoing cost of $47 per month. Pt informed CM that she can afford the $259.69 for today's fill and the $47 each month thereafter.  CM encouraged pt to discuss copay and any ongoing concerns with her PCP.  Pt denied all concerns with discharging home today.   Discharge order signed - no outstanding TOC needs - CM signing off    Final next level of care: Home/Self Care Barriers to Discharge: Barriers Resolved   Patient Goals and CMS Choice        Discharge Placement                       Discharge Plan and Services                                     Social Determinants of Health (SDOH) Interventions     Readmission Risk Interventions No flowsheet data found.

## 2019-05-03 NOTE — Progress Notes (Signed)
  Echocardiogram 2D Echocardiogram has been performed.  [name] 05/03/2019, 2:34 PM

## 2019-05-03 NOTE — Discharge Instructions (Addendum)
Local Endocrinologists [facility] [phone]) 1. Dr. Philemon Kingdom 2. Dr. Janie Morning [facility] [phone]) 1. Dr. Delrae Rend [facility] [phone]) 1. Dr. Jacelyn Pi 2. Dr. Anda Kraft [facility] [phone]) 1. Dr. Daneil Dolin [facility] [id number]) [[place] office]  [id number]) [[place] office] 1. Dr. Lenna Sciara [place] 2. Dr. Judithann Sheen [facility]) [phone]) 1. Autumn Hudnall Ronnald Ramp), PA 2. Dr. Amalia Greenhouse 3. Dr. Marsh Dolly. [facility] [phone]) 1. Dr. Glade Lloyd [facility] [phone]) 1. Dr. Orville Govern 2. Dr. Lelon Huh 3. Dr. Jerelene Redden 4. Alwyn Ren, FNP Dr. Carolynn Serve. [facility] in [place] [phone])   Hemoglobin A1c Test Why am I having this test? You may have the hemoglobin A1c test (HbA1c test) done to:  Evaluate your risk for developing diabetes (diabetes mellitus).  Diagnose diabetes.  Monitor long-term control of blood sugar (glucose) in people who have diabetes and help make treatment decisions. This test may be done with other blood glucose tests, such as fasting blood glucose and oral glucose tolerance tests. What is being tested? Hemoglobin is a type of protein in the blood that carries oxygen. Glucose attaches to hemoglobin to form glycated hemoglobin. This test checks the amount of glycated hemoglobin in your blood, which is a good indicator of the average amount of glucose in your blood during the past 2-3 months. What kind of sample is taken?  A blood sample is required for this test. It is usually collected by inserting a needle into a blood vessel. Tell a health care provider about:  All medicines you are taking, including vitamins, herbs, eye drops, creams, and over-the-counter medicines.  Any blood disorders  you have.  Any surgeries you have had.  Any medical conditions you have.  Whether you are pregnant or may be pregnant. How are the results reported? Your results will be reported as a percentage that indicates how much of your hemoglobin has glucose attached to it (is glycated). Your health care provider will compare your results to normal ranges that were established after testing a large group of people (reference ranges). Reference ranges may vary among labs and hospitals. For this test, common reference ranges are:  Adult or child without diabetes: 4-5.6%.  Adult or child with diabetes and good blood glucose control: less than 7%. What do the results mean? If you have diabetes:  A result of less than 7% is considered normal, meaning that your blood glucose is well controlled.  A result higher than 7% means that your blood glucose is not well controlled, and your treatment plan may need to be adjusted. If you do not have diabetes:  A result within the reference range is considered normal, meaning that you are not at high risk for diabetes.  A result of 5.7-6.4% means that you have a high risk of developing diabetes, and you may have prediabetes. Prediabetes is the condition of having a blood glucose level that is higher than it should be, but not high enough for you to be diagnosed with diabetes. Having prediabetes puts you at risk for developing type 2 diabetes (type 2 diabetes mellitus). You may have more tests, including a repeat HbA1c test.  Results of 6.5% or higher on two separate HbA1c tests mean that you have diabetes. You may have more tests to confirm the diagnosis. Abnormally low HbA1c values may be caused by:  Pregnancy.  Severe blood loss.  Receiving donated  blood (transfusions).  Low red blood cell count (anemia).  Long-term kidney failure.  Some unusual forms (variants) of hemoglobin. Talk with your health care provider about what your results mean. Questions  to ask your health care provider Ask your health care provider, or the department that is doing the test:  When will my results be ready?  How will I get my results?  What are my treatment options?  What other tests do I need?  What are my next steps? Summary  The hemoglobin A1c test (HbA1c test) may be done to evaluate your risk for developing diabetes, to diagnose diabetes, and to monitor long-term control of blood sugar (glucose) in people who have diabetes and help make treatment decisions.  Hemoglobin is a type of protein in the blood that carries oxygen. Glucose attaches to hemoglobin to form glycated hemoglobin. This test checks the amount of glycated hemoglobin in your blood, which is a good indicator of the average amount of glucose in your blood during the past 2-3 months.  Talk with your health care provider about what your results mean. This information is not intended to replace advice given to you by your health care provider. Make sure you discuss any questions you have with your health care provider. Document Revised: 12/18/2016 Document Reviewed: 08/18/2016 Elsevier Patient Education  [place]. Hyperglycemia Hyperglycemia occurs when the level of sugar (glucose) in the blood is too high. Glucose is a type of sugar that provides the body's main source of energy. Certain hormones (insulin and glucagon) control the level of glucose in the blood. Insulin lowers blood glucose, and glucagon increases blood glucose. Hyperglycemia can result from having too little insulin in the bloodstream, or from the body not responding normally to insulin. Hyperglycemia occurs most often in people who have diabetes (diabetes mellitus), but it can happen in people who do not have diabetes. It can develop quickly, and it can be life-threatening if it causes you to become severely dehydrated (diabetic ketoacidosis or hyperglycemic hyperosmolar state). Severe hyperglycemia is a medical  emergency. What are the causes? If you have diabetes, hyperglycemia may be caused by:  Diabetes medicine.  Medicines that increase blood glucose or affect your diabetes control.  Not eating enough, or not eating often enough.  Changes in physical activity level.  Being sick or having an infection. If you have prediabetes or undiagnosed diabetes:  Hyperglycemia may be caused by those conditions. If you do not have diabetes, hyperglycemia may be caused by:  Certain medicines, including steroid medicines, beta-blockers, epinephrine, and thiazide diuretics.  Stress.  Serious illness.  Surgery.  Diseases of the pancreas.  Infection. What increases the risk? Hyperglycemia is more likely to develop in people who have risk factors for diabetes, such as:  Having a family member with diabetes.  Having a gene for type 1 diabetes that is passed from parent to child (inherited).  Living in an area with cold weather conditions.  Exposure to certain viruses.  Certain conditions in which the body's disease-fighting (immune) system attacks itself (autoimmune disorders).  Being overweight or obese.  Having an inactive (sedentary) lifestyle.  Having been diagnosed with insulin resistance.  Having a history of prediabetes, gestational diabetes, or polycystic ovarian syndrome (PCOS).  Being of American-Indian, African-American, Hispanic/Latino, or Asian/Pacific Islander descent. What are the signs or symptoms? Hyperglycemia may not cause any symptoms. If you do have symptoms, they may include early warning signs, such as:  Increased thirst.  Hunger.  Feeling very tired.  Needing to urinate more often than usual.  Blurry vision. Other symptoms may develop if hyperglycemia gets worse, such as:  Dry mouth.  Loss of appetite.  Fruity-smelling breath.  Weakness.  Unexpected or rapid weight gain or weight loss.  Tingling or numbness in the hands or  feet.  Headache.  Skin that does not quickly return to normal after being lightly pinched and released (poor skin turgor).  Abdominal pain.  Cuts or bruises that are slow to heal. How is this diagnosed? Hyperglycemia is diagnosed with a blood test to measure your blood glucose level. This blood test is usually done while you are having symptoms. Your health care provider may also do a physical exam and review your medical history. You may have more tests to determine the cause of your hyperglycemia, such as:  A fasting blood glucose (FBG) test. You will not be allowed to eat (you will fast) for at least 8 hours before a blood sample is taken.  An A1c (hemoglobin A1c) blood test. This provides information about blood glucose control over the previous 2-3 months.  An oral glucose tolerance test (OGTT). This measures your blood glucose at two times: ? After fasting. This is your baseline blood glucose level. ? Two hours after drinking a beverage that contains glucose. How is this treated? Treatment depends on the cause of your hyperglycemia. Treatment may include:  Taking medicine to regulate your blood glucose levels. If you take insulin or other diabetes medicines, your medicine or dosage may be adjusted.  Lifestyle changes, such as exercising more, eating healthier foods, or losing weight.  Treating an illness or infection, if this caused your hyperglycemia.  Checking your blood glucose more often.  Stopping or reducing steroid medicines, if these caused your hyperglycemia. If your hyperglycemia becomes severe and it results in hyperglycemic hyperosmolar state, you must be hospitalized and given IV fluids. Follow these instructions at home:  General instructions  Take over-the-counter and prescription medicines only as told by your health care provider.  Do not use any products that contain nicotine or tobacco, such as cigarettes and e-cigarettes. If you need help quitting, ask  your health care provider.  Limit alcohol intake to no more than 1 drink per day for nonpregnant women and 2 drinks per day for men. One drink equals 12 oz of beer, 5 oz of wine, or 1 oz of hard liquor.  Learn to manage stress. If you need help with this, ask your health care provider.  Keep all follow-up visits as told by your health care provider. This is important. Eating and drinking   Maintain a healthy weight.  Exercise regularly, as directed by your health care provider.  Stay hydrated, especially when you exercise, get sick, or spend time in hot temperatures.  Eat healthy foods, such as: ? Lean proteins. ? Complex carbohydrates. ? Fresh fruits and vegetables. ? Low-fat dairy products. ? Healthy fats.  Drink enough fluid to keep your urine clear or pale yellow. If you have diabetes:  Make sure you know the symptoms of hyperglycemia.  Follow your diabetes management plan, as told by your health care provider. Make sure you: ? Take your insulin and medicines as directed. ? Follow your exercise plan. ? Follow your meal plan. Eat on time, and do not skip meals. ? Check your blood glucose as often as directed. Make sure to check your blood glucose before and after exercise. If you exercise longer or in a different way than usual, check your  blood glucose more often. ? Follow your sick day plan whenever you cannot eat or drink normally. Make this plan in advance with your health care provider.  Share your diabetes management plan with people in your workplace, school, and household.  Check your urine for ketones when you are ill and as told by your health care provider.  Carry a medical alert card or wear medical alert jewelry. Contact a health care provider if:  Your blood glucose is at or above 240 mg/dL (13.3 mmol/L) for 2 days in a row.  You have problems keeping your blood glucose in your target range.  You have frequent episodes of hyperglycemia. Get help right  away if:  You have difficulty breathing.  You have a change in how you think, feel, or act (mental status).  You have nausea or vomiting that does not go away. These symptoms may represent a serious problem that is an emergency. Do not wait to see if the symptoms will go away. Get medical help right away. Call your local emergency services (911 in the [place] Do not drive yourself to the hospital. Summary  Hyperglycemia occurs when the level of sugar (glucose) in the blood is too high.  Hyperglycemia is diagnosed with a blood test to measure your blood glucose level. This blood test is usually done while you are having symptoms. Your health care provider may also do a physical exam and review your medical history.  If you have diabetes, follow your diabetes management plan as told by your health care provider.  Contact your health care provider if you have problems keeping your blood glucose in your target range. This information is not intended to replace advice given to you by your health care provider. Make sure you discuss any questions you have with your health care provider. Document Revised: 09/23/2015 Document Reviewed: 09/23/2015 Elsevier Patient Education  [place] on my medicine - XARELTO (rivaroxaban)  [place]? Xarelto was prescribed to treat blood clots that may have been found in the veins of your legs (deep vein thrombosis) or in your lungs (pulmonary embolism) and to reduce the risk of them occurring again.  What do you need to know about Xarelto? The dose is one 20 mg tablet taken ONCE A DAY with your evening meal.  DO NOT stop taking Xarelto without talking to the health care provider who prescribed the medication.  Refill your prescription for 20 mg tablets before you run out.  After discharge, you should have regular check-up appointments with your healthcare provider that is prescribing your Xarelto.  In the  future your dose may need to be changed if your kidney function changes by a significant amount.  What do you do if you miss a dose? If you are taking Xarelto TWICE DAILY and you miss a dose, take it as soon as you remember. You may take two 15 mg tablets (total 30 mg) at the same time then resume your regularly scheduled 15 mg twice daily the next day.  If you are taking Xarelto ONCE DAILY and you miss a dose, take it as soon as you remember on the same day then continue your regularly scheduled once daily regimen the next day. Do not take two doses of Xarelto at the same time.   Important Safety Information Xarelto is a blood thinner medicine that can cause bleeding. You should call your healthcare provider right away if you experience any of the following: ? Bleeding  from an injury or your nose that does not stop. ? Unusual colored urine (red or dark brown) or unusual colored stools (red or black). ? Unusual bruising for unknown reasons. ? A serious fall or if you hit your head (even if there is no bleeding).  Some medicines may interact with Xarelto and might increase your risk of bleeding while on Xarelto. To help avoid this, consult your healthcare provider or pharmacist prior to using any new prescription or non-prescription medications, including herbals, vitamins, non-steroidal anti-inflammatory drugs (NSAIDs) and supplements.  This website has more information on Xarelto: [email or web address].[email or web address]

## 2019-05-03 NOTE — TOC Benefit Eligibility Note (Signed)
Transition of Care [facility]) Benefit Eligibility Note    Patient Details  Name: [name] MRN: [id number] Date of Birth: [birth year]   Medication/Dose: Eliquis/ Xarelto  Covered?: Yes  Spoke with Person/Company/Phone Number:: [employer]  Co-Pay: $339.67 for 90 day mail order/ $259.69 for 30 day retail for first fill to meet deductible, after this co-pay should be approx $47 for 30 day retail  Prior Approval: No  Deductible: Unmet(patient has $214 remaining on this deductible)       Delorse Lek Phone Number: 05/03/2019, 12:40 PM

## 2019-05-03 NOTE — Progress Notes (Signed)
Inpatient Diabetes Program Recommendations  AACE/ADA: New Consensus Statement on Inpatient Glycemic Control (2015)  Target Ranges:  Prepandial:   less than 140 mg/dL      Peak postprandial:   less than 180 mg/dL (1-2 hours)      Critically ill patients:  140 - 180 mg/dL   Lab Results  Component Value Date   GLUCAP 187 (H) 05/03/2019   HGBA1C 11.7 (H) 05/02/2019    Review of Glycemic Control Results for [name], [name] (MRN [id number]) as of 05/03/2019 13:18  Ref. Range 05/02/2019 22:23 05/03/2019 00:12 05/03/2019 08:18 05/03/2019 12:00  Glucose-Capillary Latest Ref Range: 70 - 99 mg/dL 297 (H) 263 (H) 277 (H) 187 (H)   Diabetes history: Type 1 DM Outpatient Diabetes medications: Humalog 8 units TID, Lantus 20 units QD Current orders for Inpatient glycemic control: Novolog 8 units TID, Lantus 20 units QD  Inpatient Diabetes Program Recommendations:    Noted patient transitioned off Endotool without overlap of basal insulin, thus contributing to hyperglycemia this AM.  Would consider adding Novolog 0-9 units TID & HS for correction.   Spoke with patient regarding outpatient diabetes management. 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 Explained what a A1c is and what it measures. Also reviewed goal A1c with patient, importance of good glucose control @ home, and blood sugar goals. Reviewed patho of DM, need for insulin, importance of not missing doses, vascular changes and commorbidities.  Patient is requesting a new meter at discharge. Blood glucose meter (incudes strips and lancets) [id number]). Reviewed frequency of checking CBGs and when to call MD.  Patient is in the process of finding new PCP. Will attach endocrinology list since patient will continue to reside in [place] and  consult [facility] for PCP list.  Patient admits to eating more carbohydrates than usual and hasn't been covering with insulin. Patient states, "I know what I need to do, I just need to have better self care and am now in a better place to be able to do that."  Thanks, Bronson Curb, MSN, RNC-OB Diabetes Coordinator [phone] ([phone])

## 2019-05-03 NOTE — Plan of Care (Signed)
New admit

## 2019-05-03 NOTE — Discharge Summary (Signed)
Physician Discharge Summary  Maureen [name] PTW:[id number] DOB: 1970-06-17 DOA: 05/01/2019  PCP: System, Pcp Not In  Admit date: 05/01/2019 Discharge date: 05/03/2019  Admitted From: Home Disposition: Home   Recommendations for Outpatient Follow-up:  1. Follow up with PCP in 1-2 weeks  Home Health: None new Equipment/Devices: None new Discharge Condition: Stable CODE STATUS: Full Diet recommendation: Heart healthy-carb modified  Brief/Interim Summary: Maureen [name] is a 49 y.o. female with medical history significant of DM type I, PE/DVT on Xarelto, fibromyalgia, and TIA presented presented after being found to be acutely altered.  It was reported that patient was initially unable to answer questions or follow commands initial examination.  Her niece that was present reported that she had called her and stated that she felt like she was going to die.  Patient is back to baseline at this time reports that she is from [place] and that is where she has all of her primary care providers.  She came down to [place] to get her Covid vaccine as well as visit her son.  Reports having her first Covid vaccine on April 1 or 2 and thereafter having some fever and generalized malaise.  At baseline patient has neuropathy and other medical conditions for which she is not gabapentin and oxycodone for pain.  She notes that she had recently misplaced her prescription for oxycodone.  She tried substituting a edible to see if it helps relieve them her pain and neuropathy symptoms.  Later on in the day patient had acute onset of tingling in her head and the rest of her body.  She feared that her blood sugars were low, and drink a soda to try and raise them.  She was able to check her blood sugars at that time and tried to call for help.  Notes associated symptoms of palpitations and change in speech.  Patient notes that she is no longer on 32 units of insulin and currently takes  ED Course: Upon  arrival at [facility] patient was noted to be tachycardic and tachypneic.  4/12 significant for WBC 14.3, sodium 132, CO2 28, glucose 491, venous pH 7.399, and anion gap 11.  Alcohol level was negative and urine drug screen positive for marijuana.  Chest x-ray showed no acute abnormalities.  Patient had been given 10 units of NovoLog insulin,   1 L of lactated Ringer's, Ativan initially.  The ED provider and started on insulin drip.  Initial orders have been placed for patient to go to stepdown.  However, patient was transitioned off the drip back to her home insulin regimen.  Patient still noted to have abnormal speech for which CT angiogram of the head neck was performed, but did not reveal any acute abnormalities.  Transferred ED to ED due to prolonged wait for need of MRI.  Discharge Diagnoses:  Principal Problem:   Acute metabolic encephalopathy Active Problems:   Chronic pain   History of pulmonary embolus (PE)   OSA (obstructive sleep apnea)   Leukocytosis   Anxiety and depression   Uncontrolled type 1 diabetes mellitus with hyperglycemia, with long-term current use of insulin (HCC)   Chronic anticoagulation  Acute metabolic encephalopathy: Patient presented to the outside facility after being found to be acutely altered. Noted to not be able to talk or follow commands, but those symptoms have resolved.  Patient reported having tingling sensation from her head all over her body reported palpitation.  Initially questioned the possibility of a TIA/stroke.  CT of the brain without contrast did not show any acute abnormalities and subsequent CTA of the head neck did not show any large vessel occlusion.  Urine drug screen was positive for marijuana. No stroke noted on MRI (full report below). Echocardiogram without significant abnormality.  - Resolved, recommend stopping psychotropic substances.   Diabetes mellitus type 1 with hyperglycemia and peripheral neuropathy: Patient initially  presented with blood glucose elevated at 492, but patient reported drinking soda as she thought her symptoms were secondary to blood sugars being low.  Last hemoglobin A1c noted to be 10.4 on 02/01/2018, updated A1c 11.7%.  - Home insulin regimen includes Lantus 20 units units daily NovoLog insulin 8 units 3 times daily. Needs PCP follow up to control this risk factor. - Continue gabapentin  Leukocytosis: Resolved without treatment   Chronic pain: Patient reports being on oxycodone 5 to 10 mg every 4 hours as needed.  However noted that she cannot find her prescription bottle since traveling from [place]. - No new prescriptions provided at discharge.  Marijuana use: UDS positive for marijuana.  Patient reported eating an edible. - Discussed that this could be a possible cause of her symptoms.  Hyperlipidemia - Continue Lipitor  History of DVT/PE on chronic anticoagulation - Continue Xarelto  Anxiety and depression - Continue Lexapro and Effexor  OSA - CPAP nightly  Discharge Instructions Discharge Instructions    Diet - low sodium heart healthy   Complete by: As directed    Discharge instructions   Complete by: As directed    You were admitted for altered mental status which has resolved, and to have an evaluation which has shown no evidence of stroke or seizure.  - It is recommended that you abstain from marijuana - No medication changes are recommended at this time.  - Please schedule a follow up with a local doctor. - A printed prescription for new glucose meter, lancets, and strips is provided to you at discharge - Seek medical attention if your symptoms return.   Increase activity slowly   Complete by: As directed      Allergies as of 05/03/2019      Reactions   Cephalexin Anaphylaxis, Shortness Of Breath, Rash, Other (See Comments)   Pt was admitted to the hospital upon taking.   Ibuprofen Nausea And Vomiting, Rash   Meloxicam Nausea Only   Reglan  [metoclopramide] Anxiety      Medication List    STOP taking these medications   venlafaxine XR 37.5 MG 24 hr capsule Commonly known as: EFFEXOR-XR     TAKE these medications   acetaminophen 500 MG tablet Commonly known as: TYLENOL Take 1,000 mg by mouth every 6 (six) hours as needed for mild pain.   atorvastatin 40 MG tablet Commonly known as: LIPITOR Take 40 mg by mouth at bedtime.   blood glucose meter kit and supplies Dispense based on patient and insurance preference. Use up to four times daily as directed. (FOR ICD-10 E10.9, E11.9).   Diphenhist 25 MG tablet Generic drug: diphenhydrAMINE Take 25 mg by mouth every 6 (six) hours as needed for allergies.   escitalopram 20 MG tablet Commonly known as: LEXAPRO Take 20 mg by mouth at bedtime.   gabapentin 300 MG capsule Commonly known as: NEURONTIN Take 600 mg by mouth 3 (three) times daily.   insulin glargine 100 UNIT/ML injection Commonly known as: Lantus Inject 0.32 mLs (32 Units total) into the skin daily. What changed: how much to take  insulin lispro 100 UNIT/ML injection Commonly known as: HUMALOG Inject 0.08 mLs (8 Units total) into the skin 3 (three) times daily with meals.   oxyCODONE 5 MG immediate release tablet Commonly known as: Oxy IR/ROXICODONE Take 5-10 mg by mouth every 4 (four) hours as needed for pain.   rivaroxaban 20 MG Tabs tablet Commonly known as: XARELTO Take 1 tablet (20 mg total) by mouth daily with supper.       Allergies  Allergen Reactions  . Cephalexin Anaphylaxis, Shortness Of Breath, Rash and Other (See Comments)    Pt was admitted to the hospital upon taking.   . Ibuprofen Nausea And Vomiting and Rash  . Meloxicam Nausea Only  . Reglan [Metoclopramide] Anxiety    Consultations:  None  Procedures/Studies: CT Angio Head W or Wo Contrast  Result Date: 05/02/2019 CLINICAL DATA:  Stroke, follow-up. EXAM: CT ANGIOGRAPHY HEAD AND NECK TECHNIQUE: Multidetector CT  imaging of the head and neck was performed using the standard protocol during bolus administration of intravenous contrast. Multiplanar CT image reconstructions and MIPs were obtained to evaluate the vascular anatomy. Carotid stenosis measurements (when applicable) are obtained utilizing NASCET criteria, using the distal internal carotid diameter as the denominator. CONTRAST:  163m OMNIPAQUE IOHEXOL 350 MG/ML SOLN COMPARISON:  Noncontrast head CT 05/01/2019, brain MRI 02/01/2018, CT angiogram head/neck 01/31/2018 FINDINGS: CT HEAD FINDINGS Brain: There is no evidence of acute intracranial hemorrhage, intracranial mass, midline shift or extra-axial fluid collection.No demarcated cortical infarction. Cerebral volume is normal. Vascular: No hyperdense vessel.  Atherosclerotic calcifications. Skull: Normal. Negative for fracture or focal lesion. Sinuses: No significant paranasal sinus disease or mastoid effusion. Orbits: Visualized orbits demonstrate no acute abnormality. Review of the MIP images confirms the above findings CTA NECK FINDINGS Aortic arch: Common origin of the innominate and left common carotid arteries. The visualized aortic arch is unremarkable. No significant innominate or proximal subclavian artery stenosis. Right carotid system: CCA and ICA patent within the neck without significant stenosis (50% or greater). Mild mixed plaque within the carotid bifurcation and proximal ICA. Left carotid system: CCA and ICA patent within the neck without significant stenosis (50% or greater). Mild calcified plaque within the carotid bifurcation and proximal ICA. Vertebral arteries: The vertebral arteries are codominant and patent within the neck without significant stenosis. Skeleton: No acute bony abnormality or aggressive osseous lesion. Other neck: No neck mass or cervical lymphadenopathy. Upper chest: No consolidation within the imaged lung apices. Review of the MIP images confirms the above findings CTA HEAD  FINDINGS Anterior circulation: The intracranial internal carotid arteries are patent. Mild calcified plaque within these vessels without significant stenosis. The M1 middle cerebral arteries are patent without significant stenosis. No M2 proximal branch occlusion or high-grade proximal stenosis is identified. Hypoplastic A1 right ACA. The anterior cerebral arteries are patent without high-grade proximal stenosis. No intracranial aneurysm is identified. Posterior circulation: The intracranial vertebral arteries are patent without significant stenosis, as is the basilar artery. The bilateral posterior cerebral arteries are patent without significant proximal stenosis. Posterior communicating arteries are poorly delineated and may be hypoplastic or absent bilaterally. Venous sinuses: Within limitations of contrast timing, no convincing thrombus. Anatomic variants: As described Review of the MIP images confirms the above findings IMPRESSION: CT head: Unremarkable non-contrast CT appearance of the brain. No evidence of acute intracranial abnormality. CTA neck: The bilateral common carotid, internal carotid and vertebral arteries are patent within the neck without significant stenosis. Mild atherosclerotic plaque within the carotid bifurcations and proximal ICAs bilaterally. CTA  head: No intracranial large vessel occlusion or proximal high-grade arterial stenosis. Electronically Signed   By: Kellie Simmering DO   On: 05/02/2019 09:45   CT Head Wo Contrast  Result Date: 05/01/2019 CLINICAL DATA:  Hyperglycemia, altered level of consciousness EXAM: CT HEAD WITHOUT CONTRAST TECHNIQUE: Contiguous axial images were obtained from the base of the skull through the vertex without intravenous contrast. COMPARISON:  02/01/2018, 11/17/2014 FINDINGS: Brain: No acute infarct or hemorrhage. Lateral ventricles and midline structures are unremarkable. No acute extra-axial fluid collections. No mass effect. Evaluation slightly limited by  patient motion during the exam. Vascular: No hyperdense vessel or unexpected calcification. Skull: Normal. Negative for fracture or focal lesion. Sinuses/Orbits: No acute finding. Other: None IMPRESSION: 1. No acute intracranial process. Electronically Signed   By: Randa Ngo M.D.   On: 05/01/2019 19:20   CT Angio Neck W and/or Wo Contrast  Result Date: 05/02/2019 CLINICAL DATA:  Stroke, follow-up. EXAM: CT ANGIOGRAPHY HEAD AND NECK TECHNIQUE: Multidetector CT imaging of the head and neck was performed using the standard protocol during bolus administration of intravenous contrast. Multiplanar CT image reconstructions and MIPs were obtained to evaluate the vascular anatomy. Carotid stenosis measurements (when applicable) are obtained utilizing NASCET criteria, using the distal internal carotid diameter as the denominator. CONTRAST:  168m OMNIPAQUE IOHEXOL 350 MG/ML SOLN COMPARISON:  Noncontrast head CT 05/01/2019, brain MRI 02/01/2018, CT angiogram head/neck 01/31/2018 FINDINGS: CT HEAD FINDINGS Brain: There is no evidence of acute intracranial hemorrhage, intracranial mass, midline shift or extra-axial fluid collection.No demarcated cortical infarction. Cerebral volume is normal. Vascular: No hyperdense vessel.  Atherosclerotic calcifications. Skull: Normal. Negative for fracture or focal lesion. Sinuses: No significant paranasal sinus disease or mastoid effusion. Orbits: Visualized orbits demonstrate no acute abnormality. Review of the MIP images confirms the above findings CTA NECK FINDINGS Aortic arch: Common origin of the innominate and left common carotid arteries. The visualized aortic arch is unremarkable. No significant innominate or proximal subclavian artery stenosis. Right carotid system: CCA and ICA patent within the neck without significant stenosis (50% or greater). Mild mixed plaque within the carotid bifurcation and proximal ICA. Left carotid system: CCA and ICA patent within the neck  without significant stenosis (50% or greater). Mild calcified plaque within the carotid bifurcation and proximal ICA. Vertebral arteries: The vertebral arteries are codominant and patent within the neck without significant stenosis. Skeleton: No acute bony abnormality or aggressive osseous lesion. Other neck: No neck mass or cervical lymphadenopathy. Upper chest: No consolidation within the imaged lung apices. Review of the MIP images confirms the above findings CTA HEAD FINDINGS Anterior circulation: The intracranial internal carotid arteries are patent. Mild calcified plaque within these vessels without significant stenosis. The M1 middle cerebral arteries are patent without significant stenosis. No M2 proximal branch occlusion or high-grade proximal stenosis is identified. Hypoplastic A1 right ACA. The anterior cerebral arteries are patent without high-grade proximal stenosis. No intracranial aneurysm is identified. Posterior circulation: The intracranial vertebral arteries are patent without significant stenosis, as is the basilar artery. The bilateral posterior cerebral arteries are patent without significant proximal stenosis. Posterior communicating arteries are poorly delineated and may be hypoplastic or absent bilaterally. Venous sinuses: Within limitations of contrast timing, no convincing thrombus. Anatomic variants: As described Review of the MIP images confirms the above findings IMPRESSION: CT head: Unremarkable non-contrast CT appearance of the brain. No evidence of acute intracranial abnormality. CTA neck: The bilateral common carotid, internal carotid and vertebral arteries are patent within the neck without significant stenosis.  Mild atherosclerotic plaque within the carotid bifurcations and proximal ICAs bilaterally. CTA head: No intracranial large vessel occlusion or proximal high-grade arterial stenosis. Electronically Signed   By: Kellie Simmering DO   On: 05/02/2019 09:45   MR BRAIN WO  CONTRAST  Result Date: 05/02/2019 CLINICAL DATA:  Focal neuro deficit of greater than 6 hours. Stroke suspected. Patient presented yesterday with hyperglycemia and altered level of consciousness. She was transferred from the [place] to the hospital today. EXAM: MRI HEAD WITHOUT CONTRAST TECHNIQUE: Multiplanar, multiecho pulse sequences of the brain and surrounding structures were obtained without intravenous contrast. COMPARISON:  CT head without contrast 05/01/2019. CTA head and neck 05/02/2019 FINDINGS: Brain: No acute infarct, hemorrhage, or mass lesion is present. No significant white matter lesions are present. The ventricles are of normal size. No significant extraaxial fluid collection is present. The internal auditory canals are within normal limits. The brainstem and cerebellum are within normal limits. Vascular: Flow is present in the major intracranial arteries. Skull and upper cervical spine: The craniocervical junction is normal. Upper cervical spine is within normal limits. Marrow signal is unremarkable. Sinuses/Orbits: The paranasal sinuses and mastoid air cells are clear. The globes and orbits are within normal limits. IMPRESSION: Negative MRI of the brain. Electronically Signed   By: San Morelle M.D.   On: 05/02/2019 14:51   DG Chest Port 1 View  Result Date: 05/01/2019 CLINICAL DATA:  Chest pain, hyperglycemia EXAM: PORTABLE CHEST 1 VIEW COMPARISON:  11/16/2017 FINDINGS: The heart size and mediastinal contours are within normal limits. Both lungs are clear. The visualized skeletal structures are unremarkable. IMPRESSION: No active disease. Electronically Signed   By: Randa Ngo M.D.   On: 05/01/2019 19:27       Subjective: At mental baseline, no focal weakness or numbness.   Discharge Exam: Vitals:   05/03/19 0322 05/03/19 0800  BP: (!) 123/51 (!) 146/66  Pulse: (!) 59 78  Resp: 13 11  Temp: 98.1 F (36.7 C) 98.2 F (36.8 C)  SpO2: 95% 97%   General: Pt is  alert, awake, not in acute distress Cardiovascular: RRR, S1/S2 +, no rubs, no gallops Respiratory: CTA bilaterally, no wheezing, no rhonchi Abdominal: Soft, NT, ND, bowel sounds + Extremities: No edema, no cyanosis  Labs: BNP (last 3 results) No results for input(s): BNP in the last 8760 hours. Basic Metabolic Panel: Recent Labs  Lab 05/01/19 1825 05/01/19 1825 05/01/19 1900 05/01/19 2119 05/02/19 0222 05/02/19 1050 05/03/19 0424  NA 132*   < > 136 138 142 139 136  K 4.8   < > 5.0 4.0 3.3* 3.6 4.2  CL 97*  --   --  100 106 103 103  CO2 24  --   --  '27 28 27 23  '$ GLUCOSE 491*  --   --  344* 209* 151* 294*  BUN 10  --   --  '10 10 9 9  '$ CREATININE 0.97  --   --  0.95 0.90 0.95 0.82  CALCIUM 8.5*  --   --  9.0 8.7* 9.1 8.9   < > = values in this interval not displayed.   Liver Function Tests: No results for input(s): AST, ALT, ALKPHOS, BILITOT, PROT, ALBUMIN in the last 168 hours. No results for input(s): LIPASE, AMYLASE in the last 168 hours. No results for input(s): AMMONIA in the last 168 hours. CBC: Recent Labs  Lab 05/01/19 1825 05/01/19 1900 05/03/19 0424  WBC 14.3*  --  10.0  NEUTROABS 12.3*  --   --  HGB 14.3 14.6 13.3  HCT 42.2 43.0 40.7  MCV 95.3  --  96.2  PLT 330  --  311   Cardiac Enzymes: No results for input(s): CKTOTAL, CKMB, CKMBINDEX, TROPONINI in the last 168 hours. BNP: Invalid input(s): POCBNP CBG: Recent Labs  Lab 05/02/19 1537 05/02/19 2223 05/03/19 0012 05/03/19 0818 05/03/19 1200  GLUCAP 211* 297* 263* 277* 187*   D-Dimer No results for input(s): DDIMER in the last 72 hours. Hgb A1c Recent Labs    05/02/19 1449  HGBA1C 11.7*   Lipid Profile No results for input(s): CHOL, HDL, LDLCALC, TRIG, CHOLHDL, LDLDIRECT in the last 72 hours. Thyroid function studies No results for input(s): TSH, T4TOTAL, T3FREE, THYROIDAB in the last 72 hours.  Invalid input(s): FREET3 Anemia work up No results for input(s): VITAMINB12, FOLATE,  FERRITIN, TIBC, IRON, RETICCTPCT in the last 72 hours. Urinalysis    Component Value Date/Time   COLORURINE YELLOW 05/02/2019 0010   APPEARANCEUR CLEAR 05/02/2019 0010   LABSPEC 1.010 05/02/2019 0010   PHURINE 6.0 05/02/2019 0010   GLUCOSEU >=500 (A) 05/02/2019 0010   HGBUR NEGATIVE 05/02/2019 0010   BILIRUBINUR NEGATIVE 05/02/2019 0010   BILIRUBINUR negative 01/17/2015 1357   BILIRUBINUR small 07/18/2013 1817   KETONESUR 15 (A) 05/02/2019 0010   PROTEINUR NEGATIVE 05/02/2019 0010   UROBILINOGEN 1.0 01/17/2015 1357   UROBILINOGEN 0.2 10/25/2014 0927   NITRITE NEGATIVE 05/02/2019 0010   LEUKOCYTESUR NEGATIVE 05/02/2019 0010    Microbiology Recent Results (from the past 240 hour(s))  SARS CORONAVIRUS 2 (TAT 6-24 HRS) Nasopharyngeal Nasopharyngeal Swab     Status: None   Collection Time: 05/01/19 10:45 PM   Specimen: Nasopharyngeal Swab  Result Value Ref Range Status   SARS Coronavirus 2 NEGATIVE NEGATIVE Final    Comment: (NOTE) SARS-CoV-2 target nucleic acids are NOT DETECTED. The SARS-CoV-2 RNA is generally detectable in upper and lower respiratory specimens during the acute phase of infection. Negative results do not preclude SARS-CoV-2 infection, do not rule out co-infections with other pathogens, and should not be used as the sole basis for treatment or other patient management decisions. Negative results must be combined with clinical observations, patient history, and epidemiological information. The expected result is Negative. Fact Sheet for Patients: SugarRoll.be Fact Sheet for Healthcare Providers: https://www.woods-mathews.com/ This test is not yet approved or cleared by the [place] FDA and  has been authorized for detection and/or diagnosis of SARS-CoV-2 by FDA under an Emergency Use Authorization (EUA). This EUA will remain  in effect (meaning this test can be used) for the duration of the COVID-19 declaration  under Section 56 4(b)(1) of the Act, 21 U.S.C. section 360bbb-3(b)(1), unless the authorization is terminated or revoked sooner. Performed at [facility], [place] [address], [place], [place] [address]     Time coordinating discharge: Approximately 40 minutes  Patrecia Pour, MD  [facility] 05/03/2019, 1:39 PM

## 2019-05-13 LAB — CBG MONITORING, ED: Glucose-Capillary: 507 mg/dL (ref 70–99)

## 2019-05-31 ENCOUNTER — Other Ambulatory Visit: Payer: Self-pay | Admitting: Nurse Practitioner

## 2019-05-31 ENCOUNTER — Encounter: Payer: Self-pay | Admitting: Nurse Practitioner

## 2019-05-31 ENCOUNTER — Ambulatory Visit (INDEPENDENT_AMBULATORY_CARE_PROVIDER_SITE_OTHER): Payer: Medicare Other | Admitting: Nurse Practitioner

## 2019-05-31 ENCOUNTER — Other Ambulatory Visit: Payer: Self-pay

## 2019-05-31 VITALS — BP 144/67 | HR 63 | Ht 64.0 in | Wt 171.2 lb

## 2019-05-31 DIAGNOSIS — Z7901 Long term (current) use of anticoagulants: Secondary | ICD-10-CM

## 2019-05-31 DIAGNOSIS — G4733 Obstructive sleep apnea (adult) (pediatric): Secondary | ICD-10-CM

## 2019-05-31 DIAGNOSIS — E663 Overweight: Secondary | ICD-10-CM

## 2019-05-31 DIAGNOSIS — R809 Proteinuria, unspecified: Secondary | ICD-10-CM

## 2019-05-31 DIAGNOSIS — E1029 Type 1 diabetes mellitus with other diabetic kidney complication: Secondary | ICD-10-CM

## 2019-05-31 DIAGNOSIS — F322 Major depressive disorder, single episode, severe without psychotic features: Secondary | ICD-10-CM | POA: Diagnosis not present

## 2019-05-31 DIAGNOSIS — E782 Mixed hyperlipidemia: Secondary | ICD-10-CM | POA: Diagnosis not present

## 2019-05-31 DIAGNOSIS — I10 Essential (primary) hypertension: Secondary | ICD-10-CM

## 2019-05-31 LAB — POCT URINALYSIS DIPSTICK
Bilirubin, UA: NEGATIVE
Blood, UA: NEGATIVE
Glucose, UA: POSITIVE — AB
Ketones, UA: NEGATIVE
Nitrite, UA: NEGATIVE
Protein, UA: NEGATIVE
Spec Grav, UA: 1.015 (ref 1.010–1.025)
Urobilinogen, UA: 0.2 E.U./dL
pH, UA: 5.5 (ref 5.0–8.0)

## 2019-05-31 LAB — POCT GLUCOSE (DEVICE FOR HOME USE)
Glucose Fasting, POC: 330 mg/dL — AB (ref 70–99)
POC Glucose: 330 mg/dl — AB (ref 70–99)

## 2019-05-31 MED ORDER — ESCITALOPRAM OXALATE 20 MG PO TABS: 20.0000 mg | ORAL_TABLET | Freq: Every day | ORAL | 2 refills | Status: DC

## 2019-05-31 MED ORDER — RIVAROXABAN 20 MG PO TABS: 20.0000 mg | ORAL_TABLET | Freq: Every day | ORAL | 2 refills | Status: AC

## 2019-05-31 MED ORDER — TRAZODONE HCL 50 MG PO TABS: 25.0000 mg | ORAL_TABLET | Freq: Every evening | ORAL | 3 refills | Status: DC | PRN

## 2019-05-31 MED ORDER — INSULIN LISPRO 100 UNIT/ML ~~LOC~~ SOLN: 8.0000 [IU] | Freq: Three times a day (TID) | SUBCUTANEOUS | 2 refills | Status: DC

## 2019-05-31 MED ORDER — INSULIN GLARGINE 100 UNIT/ML ~~LOC~~ SOLN: 32.0000 [IU] | Freq: Every day | SUBCUTANEOUS | 11 refills | Status: DC

## 2019-05-31 MED ORDER — ATORVASTATIN CALCIUM 40 MG PO TABS: 40.0000 mg | ORAL_TABLET | Freq: Every day | ORAL | 2 refills | Status: DC

## 2019-05-31 NOTE — Progress Notes (Signed)
[place], [place]  [address] Phone:  [phone]   Fax:  [phone]   New Patient Office Visit  Subjective:  Patient ID: Maureen [name], female    DOB: [birth year]  Age: 49 y.o. MRN: [id number]  CC:  Chief Complaint  Patient presents with  . New Patient (Initial Visit)    hospital follow up  for  x2 day Last attending  Acute metabolic encephalopathy    HPI Maureen [name] presents for establish care. She recently discharged from the hospital acute metabolic encephalopathy.  She  has a past medical history of Abdominal pain, Achilles tendon contracture, left, Anxiety, Arthritis, Biliary dyskinesia, Cervical strain, Closed head injury (2010), Depression, DVT (deep venous thrombosis) ([place]), Elevated LFTs, Fibromyalgia, GERD (gastroesophageal reflux disease), History of kidney stones, Hyperlipidemia, Hypoglycemia, Joint pain, Major depressive disorder, Migraine, Nausea & vomiting, OSA on CPAP (05/14/2016), Pneumonia, Post traumatic stress disorder (PTSD), Pulmonary embolism ([place]), Renal stones, Skin rash, Sleep trouble, Stroke ([place]), TIA (transient ischemic attack), Wears glasses, and 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   Diabetes Mellitus Patient presents for follow up of diabetes. Current symptoms include: hyperglycemia. Symptoms have progressed to a point and plateaued. Patient denies foot ulcerations, hypoglycemia , nausea, vomiting and weight loss. Evaluation to date has included: fasting blood sugar, fasting lipid panel, hemoglobin A1C and microalbuminuria.  Home sugars: BGs are running  consistent with Hgb A1C. Current treatment: Continued insulin which has been somewhat effective.  She has been under increased stress with caring for her family.  She has not been compliant with her home medications.  She admits that she knows that this is not the proper way to handle her diabetes and her health in  general.  She is committing to doing better.  Past Medical History:  Diagnosis Date  . Abdominal pain   . Achilles tendon contracture, left    with Lisfranc fracture  . Anxiety   . Arthritis   . Biliary dyskinesia   . Cervical strain   . Closed head injury 2010  . Depression   . DVT (deep venous thrombosis) ([place])   . Elevated LFTs   . Fibromyalgia   . GERD (gastroesophageal reflux disease)   . History of kidney stones   . Hyperlipidemia   . Hypoglycemia   . Joint pain   . Major depressive disorder   . Migraine    hx of none recent  . Nausea & vomiting   . OSA on CPAP 05/14/2016   Moderate to severe OSA with an AHI of 28/hr now on CPAP at 19cm H2O  . Pneumonia   . Post traumatic stress disorder (PTSD)   . Pulmonary embolism ([place])   . Renal stones   . Skin rash    due to medications  . Sleep trouble   . Stroke [facility])    " series of mini strokes around 2007"  . TIA (transient ischemic attack)   . Wears glasses   . 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 ABDOMINAL HYSTERECTOMY  2001  . APPENDECTOMY    . [place]  . CHOLECYSTECTOMY  02/10/2011   Procedure: LAPAROSCOPIC CHOLECYSTECTOMY WITH INTRAOPERATIVE CHOLANGIOGRAM;  Surgeon: Judieth Keens, DO;  Location: [facility] [facility];  Service: General;  Laterality: N/A;  . COLONOSCOPY    . DILATION  AND CURETTAGE OF UTERUS    . exploratory laparomty  Mar 04, 2012  . EYE SURGERY  2006   laser eye surgery left eye  . KNEE SURGERY Left 2012  . LAPAROSCOPIC APPENDECTOMY N/A 10/12/2012   Procedure: DIAGNOSTIC APPENDECTOMY LAPAROSCOPIC;  Surgeon: Madilyn Hook, DO;  Location: [facility];  Service: General;  Laterality: N/A;  . lipoma removed  yrs ago  . LIVER BIOPSY    . ORIF ANKLE FRACTURE Left 01/03/2016   Procedure: OPEN REDUCTION INTERNAL FIXATION (ORIF) LEFT LISFRANC JOINT, GASTROC RECESSION;  Surgeon: Newt Minion,  MD;  Location: [facility];  Service: Orthopedics;  Laterality: Left;  . ROBOTIC ASSISTED LAPAROSCOPIC LYSIS OF ADHESION N/A 03/04/2012   Procedure: ROBOTIC ASSISTED LAPAROSCOPIC LYSIS OF EXTENSIVE ADHESIONS;  Surgeon: Claiborne Billings A. Pamala Hurry, MD;  Location: [facility];  Service: Gynecology;  Laterality: N/A;  . TONSILLECTOMY  2001    Family History  Problem Relation Age of Onset  . Cancer Father        lymphoma  . Nephrolithiasis Father   . Vascular Disease Father   . Alcohol abuse Father   . Drug abuse Father   . Cancer Maternal Grandmother        colon  . Hypertension Mother   . Heart disease Mother   . Cataracts Mother   . Depression Mother   . Diabetes Brother   . Drug abuse Brother   . Mental illness Maternal Grandfather   . Cancer Maternal Grandfather   . Heart disease Paternal Grandmother   . Heart disease Paternal Grandfather   . Suicidality Maternal Uncle     Social History   Socioeconomic History  . Marital status: Divorced    Spouse name: Rainbow Salman  . Number of children: 2  . Years of education: 12+  . Highest education level: Not on file  Occupational History  . Occupation: stay at home mom    Employer: NOT EMPLOYED  Tobacco Use  . Smoking status: Never Smoker  . Smokeless tobacco: Never Used  Substance and Sexual Activity  . Alcohol use: Yes    Comment: occ  . Drug use: No  . Sexual activity: Not on file  Other Topics Concern  . Not on file  Social History Narrative   Lives independently.  Before going back to school, she was a Research scientist (physical sciences) at [employer].  Stop working when she was diagnosed with endometriosis and had a TIA.   Social Determinants of Health   Financial Resource Strain:   . Difficulty of Paying Living Expenses:   Food Insecurity:   . Worried About Charity fundraiser in the Last Year:   . Arboriculturist in the Last Year:   Transportation Needs:   . Film/video editor (Medical):   Marland Kitchen Lack of Transportation (Non-Medical):    Physical Activity:   . Days of Exercise per Week:   . Minutes of Exercise per Session:   Stress:   . Feeling of Stress :   Social Connections:   . Frequency of Communication with Friends and Family:   . Frequency of Social Gatherings with Friends and Family:   . Attends Religious Services:   . Active Member of Clubs or Organizations:   . Attends Archivist Meetings:   Marland Kitchen Marital Status:   Intimate Partner Violence:   . Fear of Current or Ex-Partner:   . Emotionally Abused:   Marland Kitchen Physically Abused:   . Sexually Abused:  ROS Review of Systems  Objective:   Today's Vitals: BP (!) 144/67 (BP Location: Left Arm)   Pulse 63   Ht '5\' 4"'$  (1.626 m)   Wt 171 lb 3.2 oz (77.7 kg)   SpO2 100%   BMI 29.39 kg/m   Physical Exam Vitals reviewed.  Constitutional:      Appearance: Normal appearance.  Cardiovascular:     Rate and Rhythm: Normal rate and regular rhythm.     Pulses: Normal pulses.     Heart sounds: Normal heart sounds.  Pulmonary:     Effort: Pulmonary effort is normal.     Breath sounds: Normal breath sounds.  Abdominal:     General: Bowel sounds are normal.     Palpations: Abdomen is soft.  Musculoskeletal:        General: Normal range of motion.     Cervical back: Normal range of motion.  Skin:    General: Skin is warm and dry.  Neurological:     Mental Status: She is alert and oriented to person, place, and time.  Psychiatric:        Mood and Affect: Mood normal.        Behavior: Behavior normal.        Thought Content: Thought content normal.        Judgment: Judgment normal.     Assessment & Plan:   Problem List Items Addressed This Visit      High   Chronic anticoagulation   DM type 1 (diabetes mellitus, type 1) ([place]) - Primary   Relevant Medications   atorvastatin (LIPITOR) 40 MG tablet   insulin glargine (LANTUS) 100 UNIT/ML injection   insulin lispro (HUMALOG) 100 UNIT/ML injection   Other Relevant Orders   POCT Glucose (Device  for Home Use) (Completed)   POCT Urinalysis Dipstick (Completed)   HLD (hyperlipidemia)   Relevant Medications   rivaroxaban (XARELTO) 20 MG TABS tablet   atorvastatin (LIPITOR) 40 MG tablet   OSA (obstructive sleep apnea)   Severe major depression without psychotic features ([facility])   Relevant Medications   escitalopram (LEXAPRO) 20 MG tablet   traZODone (DESYREL) 50 MG tablet    Other Visit Diagnoses    Overweight (BMI 25.0-29.9)       Essential hypertension       Continue with current regimen along with lifestyle modification   Relevant Medications   rivaroxaban (XARELTO) 20 MG TABS tablet   atorvastatin (LIPITOR) 40 MG tablet      Outpatient Encounter Medications as of 05/31/2019  Medication Sig  . acetaminophen (TYLENOL) 500 MG tablet Take 1,000 mg by mouth every 6 (six) hours as needed for mild pain.  Marland Kitchen gabapentin (NEURONTIN) 300 MG capsule Take 600 mg by mouth 3 (three) times daily.   . insulin glargine (LANTUS) 100 UNIT/ML injection Inject 0.32 mLs (32 Units total) into the skin daily.  . insulin lispro (HUMALOG) 100 UNIT/ML injection Inject 0.08 mLs (8 Units total) into the skin 3 (three) times daily with meals.  Marland Kitchen oxyCODONE (OXY IR/ROXICODONE) 5 MG immediate release tablet Take 5-10 mg by mouth every 4 (four) hours as needed for pain.  . rivaroxaban (XARELTO) 20 MG TABS tablet Take 1 tablet (20 mg total) by mouth daily with supper.  . [DISCONTINUED] insulin glargine (LANTUS) 100 UNIT/ML injection Inject 0.32 mLs (32 Units total) into the skin daily. (Patient taking differently: Inject 20 Units into the skin daily. )  . [DISCONTINUED] insulin lispro (HUMALOG) 100 UNIT/ML injection Inject 0.08  mLs (8 Units total) into the skin 3 (three) times daily with meals.  . [DISCONTINUED] rivaroxaban (XARELTO) 20 MG TABS tablet Take 1 tablet (20 mg total) by mouth daily with supper.  Marland Kitchen atorvastatin (LIPITOR) 40 MG tablet Take 1 tablet (40 mg total) by mouth at bedtime.  . blood glucose  meter kit and supplies Dispense based on patient and insurance preference. Use up to four times daily as directed. (FOR ICD-10 E10.9, E11.9). (Patient not taking: Reported on 05/31/2019)  . diphenhydrAMINE (DIPHENHIST) 25 MG tablet Take 25 mg by mouth every 6 (six) hours as needed for allergies.  Marland Kitchen escitalopram (LEXAPRO) 20 MG tablet Take 1 tablet (20 mg total) by mouth at bedtime.  . traZODone (DESYREL) 50 MG tablet Take 0.5-1 tablets (25-50 mg total) by mouth at bedtime as needed for sleep.  . [DISCONTINUED] atorvastatin (LIPITOR) 40 MG tablet Take 40 mg by mouth at bedtime.   . [DISCONTINUED] escitalopram (LEXAPRO) 20 MG tablet Take 20 mg by mouth at bedtime.   No facility-administered encounter medications on file as of 05/31/2019.    Follow-up: Return in about 2 months (around 07/31/2019).   Vevelyn Francois, NP

## 2019-05-31 NOTE — Patient Instructions (Addendum)
Diabetes Mellitus and Foot Care Foot care is an important part of your health, especially when you have diabetes. Diabetes may cause you to have problems because of poor blood flow (circulation) to your feet and legs, which can cause your skin to:  Become thinner and drier.  Break more easily.  Heal more slowly.  Peel and crack. You may also have nerve damage (neuropathy) in your legs and feet, causing decreased feeling in them. This means that you may not notice minor injuries to your feet that could lead to more serious problems. Noticing and addressing any potential problems early is the best way to prevent future foot problems. How to care for your feet Foot hygiene  Wash your feet daily with warm water and mild soap. Do not use hot water. Then, pat your feet and the areas between your toes until they are completely dry. Do not soak your feet as this can dry your skin.  Trim your toenails straight across. Do not dig under them or around the cuticle. File the edges of your nails with an emery board or nail file.  Apply a moisturizing lotion or petroleum jelly to the skin on your feet and to dry, brittle toenails. Use lotion that does not contain alcohol and is unscented. Do not apply lotion between your toes. Shoes and socks  Wear clean socks or stockings every day. Make sure they are not too tight. Do not wear knee-high stockings since they may decrease blood flow to your legs.  Wear shoes that fit properly and have enough cushioning. Always look in your shoes before you put them on to be sure there are no objects inside.  To break in new shoes, wear them for just a few hours a day. This prevents injuries on your feet. Wounds, scrapes, corns, and calluses  Check your feet daily for blisters, cuts, bruises, sores, and redness. If you cannot see the bottom of your feet, use a mirror or ask someone for help.  Do not cut corns or calluses or try to remove them with medicine.  If you  find a minor scrape, cut, or break in the skin on your feet, keep it and the skin around it clean and dry. You may clean these areas with mild soap and water. Do not clean the area with peroxide, alcohol, or iodine.  If you have a wound, scrape, corn, or callus on your foot, look at it several times a day to make sure it is healing and not infected. Check for: ? Redness, swelling, or pain. ? Fluid or blood. ? Warmth. ? Pus or a bad smell. General instructions  Do not cross your legs. This may decrease blood flow to your feet.  Do not use heating pads or hot water bottles on your feet. They may burn your skin. If you have lost feeling in your feet or legs, you may not know this is happening until it is too late.  Protect your feet from hot and cold by wearing shoes, such as at the beach or on hot pavement.  Schedule a complete foot exam at least once a year (annually) or more often if you have foot problems. If you have foot problems, report any cuts, sores, or bruises to your health care provider immediately. Contact a health care provider if:  You have a medical condition that increases your risk of infection and you have any cuts, sores, or bruises on your feet.  You have an injury that is not   healing.  You have redness on your legs or feet.  You feel burning or tingling in your legs or feet.  You have pain or cramps in your legs and feet.  Your legs or feet are numb.  Your feet always feel cold.  You have pain around a toenail. Get help right away if:  You have a wound, scrape, corn, or callus on your foot and: ? You have pain, swelling, or redness that gets worse. ? You have fluid or blood coming from the wound, scrape, corn, or callus. ? Your wound, scrape, corn, or callus feels warm to the touch. ? You have pus or a bad smell coming from the wound, scrape, corn, or callus. ? You have a fever. ? You have a red line going up your leg. Summary  Check your feet every day  for cuts, sores, red spots, swelling, and blisters.  Moisturize feet and legs daily.  Wear shoes that fit properly and have enough cushioning.  If you have foot problems, report any cuts, sores, or bruises to your health care provider immediately.  Schedule a complete foot exam at least once a year (annually) or more often if you have foot problems. This information is not intended to replace advice given to you by your health care provider. Make sure you discuss any questions you have with your health care provider. Document Revised: 09/28/2018 Document Reviewed: 02/07/2016 Elsevier Patient Education  [place].  Insomnia Insomnia is a sleep disorder that makes it difficult to fall asleep or stay asleep. Insomnia can cause fatigue, low energy, difficulty concentrating, mood swings, and poor performance at work or school. There are three different ways to classify insomnia:  Difficulty falling asleep.  Difficulty staying asleep.  Waking up too early in the morning. Any type of insomnia can be long-term (chronic) or short-term (acute). Both are common. Short-term insomnia usually lasts for three months or less. Chronic insomnia occurs at least three times a week for longer than three months. What are the causes? Insomnia may be caused by another condition, situation, or substance, such as:  Anxiety.  Certain medicines.  Gastroesophageal reflux disease (GERD) or other gastrointestinal conditions.  Asthma or other breathing conditions.  Restless legs syndrome, sleep apnea, or other sleep disorders.  Chronic pain.  Menopause.  Stroke.  Abuse of alcohol, tobacco, or illegal drugs.  Mental health conditions, such as depression.  Caffeine.  Neurological disorders, such as Alzheimer's disease.  An overactive thyroid (hyperthyroidism). Sometimes, the cause of insomnia may not be known. What increases the risk? Risk factors for insomnia include:  Gender. Women  are affected more often than men.  Age. Insomnia is more common as you get older.  Stress.  Lack of exercise.  Irregular work schedule or working night shifts.  Traveling between different time zones.  Certain medical and mental health conditions. What are the signs or symptoms? If you have insomnia, the main symptom is having trouble falling asleep or having trouble staying asleep. This may lead to other symptoms, such as:  Feeling fatigued or having low energy.  Feeling nervous about going to sleep.  Not feeling rested in the morning.  Having trouble concentrating.  Feeling irritable, anxious, or depressed. How is this diagnosed? This condition may be diagnosed based on:  Your symptoms and medical history. Your health care provider may ask about: ? Your sleep habits. ? Any medical conditions you have. ? Your mental health.  A physical exam. How is this treated? Treatment  for insomnia depends on the cause. Treatment may focus on treating an underlying condition that is causing insomnia. Treatment may also include:  Medicines to help you sleep.  Counseling or therapy.  Lifestyle adjustments to help you sleep better. Follow these instructions at home: Eating and drinking   Limit or avoid alcohol, caffeinated beverages, and cigarettes, especially close to bedtime. These can disrupt your sleep.  Do not eat a large meal or eat spicy foods right before bedtime. This can lead to digestive discomfort that can make it hard for you to sleep. Sleep habits   Keep a sleep diary to help you and your health care provider figure out what could be causing your insomnia. Write down: ? When you sleep. ? When you wake up during the night. ? How well you sleep. ? How rested you feel the next day. ? Any side effects of medicines you are taking. ? What you eat and drink.  Make your bedroom a dark, comfortable place where it is easy to fall asleep. ? Put up shades or blackout  curtains to block light from outside. ? Use a white noise machine to block noise. ? Keep the temperature cool.  Limit screen use before bedtime. This includes: ? Watching TV. ? Using your smartphone, tablet, or computer.  Stick to a routine that includes going to bed and waking up at the same times every day and night. This can help you fall asleep faster. Consider making a quiet activity, such as reading, part of your nighttime routine.  Try to avoid taking naps during the day so that you sleep better at night.  Get out of bed if you are still awake after 15 minutes of trying to sleep. Keep the lights down, but try reading or doing a quiet activity. When you feel sleepy, go back to bed. General instructions  Take over-the-counter and prescription medicines only as told by your health care provider.  Exercise regularly, as told by your health care provider. Avoid exercise starting several hours before bedtime.  Use relaxation techniques to manage stress. Ask your health care provider to suggest some techniques that may work well for you. These may include: ? Breathing exercises. ? Routines to release muscle tension. ? Visualizing peaceful scenes.  Make sure that you drive carefully. Avoid driving if you feel very sleepy.  Keep all follow-up visits as told by your health care provider. This is important. Contact a health care provider if:  You are tired throughout the day.  You have trouble in your daily routine due to sleepiness.  You continue to have sleep problems, or your sleep problems get worse. Get help right away if:  You have serious thoughts about hurting yourself or someone else. If you ever feel like you may hurt yourself or others, or have thoughts about taking your own life, get help right away. You can go to your nearest emergency department or call:  Your local emergency services (911 in the [place]  A suicide crisis helpline, such as the [facility] at 681-822-6179. This is open 24 hours a day. Summary  Insomnia is a sleep disorder that makes it difficult to fall asleep or stay asleep.  Insomnia can be long-term (chronic) or short-term (acute).  Treatment for insomnia depends on the cause. Treatment may focus on treating an underlying condition that is causing insomnia.  Keep a sleep diary to help you and your health care provider figure out what could be causing your insomnia.  This information is not intended to replace advice given to you by your health care provider. Make sure you discuss any questions you have with your health care provider. Document Revised: 12/18/2016 Document Reviewed: 10/15/2016 Elsevier Patient Education  2020 [employer].

## 2019-06-02 ENCOUNTER — Other Ambulatory Visit: Payer: Self-pay

## 2019-06-02 ENCOUNTER — Emergency Department (HOSPITAL_COMMUNITY): Payer: Medicare Other

## 2019-06-02 ENCOUNTER — Encounter (HOSPITAL_COMMUNITY): Payer: Self-pay | Admitting: Emergency Medicine

## 2019-06-02 ENCOUNTER — Emergency Department (HOSPITAL_COMMUNITY)
Admission: EM | Admit: 2019-06-02 | Discharge: 2019-06-02 | Disposition: A | Discharge status: Home / Self Care | Payer: Medicare Other | Attending: Emergency Medicine | Admitting: Emergency Medicine

## 2019-06-02 DIAGNOSIS — S92065A Nondisplaced intraarticular fracture of left calcaneus, initial encounter for closed fracture: Secondary | ICD-10-CM | POA: Insufficient documentation

## 2019-06-02 DIAGNOSIS — Y999 Unspecified external cause status: Secondary | ICD-10-CM | POA: Insufficient documentation

## 2019-06-02 DIAGNOSIS — W010XXA Fall on same level from slipping, tripping and stumbling without subsequent striking against object, initial encounter: Secondary | ICD-10-CM | POA: Diagnosis not present

## 2019-06-02 DIAGNOSIS — S93402A Sprain of unspecified ligament of left ankle, initial encounter: Secondary | ICD-10-CM | POA: Insufficient documentation

## 2019-06-02 DIAGNOSIS — S99922A Unspecified injury of left foot, initial encounter: Secondary | ICD-10-CM | POA: Diagnosis present

## 2019-06-02 DIAGNOSIS — Y929 Unspecified place or not applicable: Secondary | ICD-10-CM | POA: Diagnosis not present

## 2019-06-02 DIAGNOSIS — S39012A Strain of muscle, fascia and tendon of lower back, initial encounter: Secondary | ICD-10-CM | POA: Diagnosis not present

## 2019-06-02 DIAGNOSIS — Z79899 Other long term (current) drug therapy: Secondary | ICD-10-CM | POA: Diagnosis not present

## 2019-06-02 DIAGNOSIS — E109 Type 1 diabetes mellitus without complications: Secondary | ICD-10-CM | POA: Insufficient documentation

## 2019-06-02 DIAGNOSIS — Z794 Long term (current) use of insulin: Secondary | ICD-10-CM | POA: Insufficient documentation

## 2019-06-02 DIAGNOSIS — Y93E5 Activity, floor mopping and cleaning: Secondary | ICD-10-CM | POA: Insufficient documentation

## 2019-06-02 MED ORDER — OXYCODONE HCL 5 MG PO TABS
5.0000 mg | ORAL_TABLET | Freq: Once | ORAL | Status: AC
  Administered 2019-06-02: 5 mg via ORAL
  Filled 2019-06-02: qty 1

## 2019-06-02 NOTE — ED Provider Notes (Addendum)
[facility] EMERGENCY DEPARTMENT Provider Note   CSN: [id number] Arrival date & time: 06/02/19  0419     History Chief Complaint  Patient presents with  . Fall    Back/Ankle pain     Maureen [name] is a 49 y.o. female.  Patient with previous left foot surgery due to Lisfranc fracture performed by Dr. Sharol Given several years ago presents the emergency department with acute onset of left foot, ankle, lower back pain after she slipped on a floor that she was cleaning.  Injury occurred last night around 6 PM.  She also reports pain in her right ankle but this was less severe.  She was unable to bear weight on her left foot and ankle.  She takes chronic oxycodone 5 mg which has not been improving her pain.  Due to her inability to ambulate she presents to the emergency department today.  Back pain is bilateral.  Pain does not radiate.  No hip pain.  She denies any head injuries or neck pain.        Past Medical History:  Diagnosis Date  . Abdominal pain   . Achilles tendon contracture, left    with Lisfranc fracture  . Anxiety   . Arthritis   . Biliary dyskinesia   . Cervical strain   . Closed head injury 2010  . Depression   . DVT (deep venous thrombosis) ([place])   . Elevated LFTs   . Fibromyalgia   . GERD (gastroesophageal reflux disease)   . History of kidney stones   . Hyperlipidemia   . Hypoglycemia   . Joint pain   . Major depressive disorder   . Migraine    hx of none recent  . Nausea & vomiting   . OSA on CPAP 05/14/2016   Moderate to severe OSA with an AHI of 28/hr now on CPAP at 19cm H2O  . Pneumonia   . Post traumatic stress disorder (PTSD)   . Pulmonary embolism ([place])   . Renal stones   . Skin rash    due to medications  . Sleep trouble   . Stroke [facility])    " series of mini strokes around 2007"  . TIA (transient ischemic attack)   . Wears glasses   . 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 Acute metabolic encephalopathy 63/87/5643  . Chronic anticoagulation 05/02/2019  . AMS (altered mental status) 05/01/2019  . Vision loss 02/02/2018  . Acute loss of vision 02/01/2018  . Leukocytosis 02/01/2018  . Hyperglycemia 02/01/2018  . Hyperbilirubinemia 02/01/2018  . VTE (venous thromboembolism) 02/01/2018  . Anxiety and depression 02/01/2018  . Orthostatic hypotension 02/01/2018  . Uncontrolled type 1 diabetes mellitus with hyperglycemia, with long-term current use of insulin ([place]) 02/01/2018  . OSA (obstructive sleep apnea) 05/14/2016  . Obesity (BMI 30-39.9) 05/14/2016  . Acute deep vein thrombosis (DVT) of femoral vein of left lower extremity ([place]) 02/10/2016  . Diabetic polyneuropathy associated with type 2 diabetes mellitus ([place]) 02/10/2016  . Lisfranc dislocation, left, sequela 12/16/2015  . Short Achilles tendon (acquired), left ankle 12/16/2015  . Chest pain with moderate risk for cardiac etiology 11/29/2015  . Elevated troponin 11/19/2015  . Migraine headache 11/19/2015  . Neck pain 11/19/2015  . Acute kidney injury ([place]) 02/19/2015  . DKA (diabetic ketoacidoses) ([place]) 10/24/2014  . HLD (hyperlipidemia) 10/24/2014  . History  of pulmonary embolus (PE) 09/28/2014  . Abnormal LFTs 09/03/2014  . Dizziness 09/03/2014  . Acute pulmonary embolism ([place]) 09/03/2014  . Transaminitis 09/02/2014  . GAD (generalized anxiety disorder) 11/23/2013  . PTSD (post-traumatic stress disorder) 11/23/2013  . Severe major depression without psychotic features ([place]) 10/10/2013  . Endometriosis 12/08/2011  . Arthritis of left knee 12/08/2011  . Diabetic retinopathy ([place]) 12/08/2011  . Chronic pain 10/23/2011  . DM type 1 (diabetes mellitus, type 1) ([place]) 10/13/2011    Past Surgical History:  Procedure Laterality Date  . ABDOMINAL HYSTERECTOMY  2001  . APPENDECTOMY    . [employer]  . CHOLECYSTECTOMY   02/10/2011   Procedure: LAPAROSCOPIC CHOLECYSTECTOMY WITH INTRAOPERATIVE CHOLANGIOGRAM;  Surgeon: Judieth Keens, DO;  Location: [facility];  Service: General;  Laterality: N/A;  . COLONOSCOPY    . DILATION AND CURETTAGE OF UTERUS    . exploratory laparomty  Mar 04, 2012  . EYE SURGERY  2006   laser eye surgery left eye  . KNEE SURGERY Left 2012  . LAPAROSCOPIC APPENDECTOMY N/A 10/12/2012   Procedure: DIAGNOSTIC APPENDECTOMY LAPAROSCOPIC;  Surgeon: Madilyn Hook, DO;  Location: [facility];  Service: General;  Laterality: N/A;  . lipoma removed  yrs ago  . LIVER BIOPSY    . ORIF ANKLE FRACTURE Left 01/03/2016   Procedure: OPEN REDUCTION INTERNAL FIXATION (ORIF) LEFT LISFRANC JOINT, GASTROC RECESSION;  Surgeon: Newt Minion, MD;  Location: [facility];  Service: Orthopedics;  Laterality: Left;  . ROBOTIC ASSISTED LAPAROSCOPIC LYSIS OF ADHESION N/A 03/04/2012   Procedure: ROBOTIC ASSISTED LAPAROSCOPIC LYSIS OF EXTENSIVE ADHESIONS;  Surgeon: Claiborne Billings A. Pamala Hurry, MD;  Location: [facility];  Service: Gynecology;  Laterality: N/A;  . TONSILLECTOMY  2001     OB History   No obstetric history on file.     Family History  Problem Relation Age of Onset  . Cancer Father        lymphoma  . Nephrolithiasis Father   . Vascular Disease Father   . Alcohol abuse Father   . Drug abuse Father   . Cancer Maternal Grandmother        colon  . Hypertension Mother   . Heart disease Mother   . Cataracts Mother   . Depression Mother   . Diabetes Brother   . Drug abuse Brother   . Mental illness Maternal Grandfather   . Cancer Maternal Grandfather   . Heart disease Paternal Grandmother   . Heart disease Paternal Grandfather   . Suicidality Maternal Uncle     Social History   Tobacco Use  . Smoking status: Never Smoker  . Smokeless tobacco: Never Used  Substance Use Topics  . Alcohol use: Yes    Comment: occ  . Drug use: No    Home Medications Prior to Admission medications   Medication Sig Start Date End  Date Taking? Authorizing Provider  acetaminophen (TYLENOL) 500 MG tablet Take 1,000 mg by mouth every 6 (six) hours as needed for mild pain.   Yes [provider]  atorvastatin (LIPITOR) 40 MG tablet Take 1 tablet (40 mg total) by mouth at bedtime. 05/31/19 08/29/19 Yes Vevelyn Francois, NP  diphenhydrAMINE (DIPHENHIST) 25 MG tablet Take 25 mg by mouth every 6 (six) hours as needed for allergies.   Yes [provider]  escitalopram (LEXAPRO) 20 MG tablet Take 1 tablet (20 mg total) by mouth at bedtime. 05/31/19 08/29/19 Yes Vevelyn Francois, NP  gabapentin (NEURONTIN) 300 MG capsule  Take 600 mg by mouth 3 (three) times daily.  04/03/17  Yes [provider]  insulin glargine (LANTUS) 100 UNIT/ML injection Inject 0.32 mLs (32 Units total) into the skin daily. 05/31/19  Yes King, Diona Foley, NP  insulin lispro (HUMALOG) 100 UNIT/ML injection Inject 0.08 mLs (8 Units total) into the skin 3 (three) times daily with meals. 05/31/19  Yes Vevelyn Francois, NP  oxyCODONE (OXY IR/ROXICODONE) 5 MG immediate release tablet Take 5-10 mg by mouth every 4 (four) hours as needed for pain. 04/17/19  Yes [provider]  rivaroxaban (XARELTO) 20 MG TABS tablet Take 1 tablet (20 mg total) by mouth daily with supper. 05/31/19  Yes Vevelyn Francois, NP  traZODone (DESYREL) 50 MG tablet Take 0.5-1 tablets (25-50 mg total) by mouth at bedtime as needed for sleep. 05/31/19  Yes Vevelyn Francois, NP  blood glucose meter kit and supplies Dispense based on patient and insurance preference. Use up to four times daily as directed. (FOR ICD-10 E10.9, E11.9). Patient not taking: Reported on 05/31/2019 05/03/19   Patrecia Pour, MD    Allergies    Cephalexin, Ibuprofen, Meloxicam, and Reglan [metoclopramide]  Review of Systems   Review of Systems  Constitutional: Negative for activity change, fever and unexpected weight change.  Gastrointestinal: Negative for constipation.       Negative for fecal  incontinence.   Genitourinary: Negative for dysuria, flank pain and hematuria.       Negative for urinary incontinence or retention.  Musculoskeletal: Positive for arthralgias, back pain, gait problem and myalgias. Negative for joint swelling and neck pain.  Skin: Negative for wound.  Neurological: Negative for weakness and numbness.       Denies saddle paresthesias.    Physical Exam Updated Vital Signs BP 127/67   Pulse 84   Temp 98.5 F (36.9 C)   Resp 16   Ht '5\' 4"'$  (1.626 m)   Wt 85 kg   SpO2 100%   BMI 32.17 kg/m   Physical Exam Vitals and nursing note reviewed.  Constitutional:      Appearance: She is well-developed.  HENT:     Head: Normocephalic and atraumatic.  Eyes:     Conjunctiva/sclera: Conjunctivae normal.  Cardiovascular:     Pulses:          Dorsalis pedis pulses are 2+ on the left side.  Pulmonary:     Effort: Pulmonary effort is normal.  Abdominal:     Palpations: Abdomen is soft.     Tenderness: There is no abdominal tenderness.  Musculoskeletal:     Cervical back: Normal range of motion and neck supple. No tenderness. Normal range of motion.     Thoracic back: No tenderness. Normal range of motion.     Lumbar back: Tenderness (bilateral, paraspinous) present. No spasms or bony tenderness. Normal range of motion.     Right hip: No tenderness. Normal range of motion.     Right knee: Normal range of motion. No tenderness.     Left lower leg: Tenderness present. No swelling or bony tenderness. No edema.     Left ankle: No lacerations. Tenderness present over the lateral malleolus. No base of 5th metatarsal or proximal fibula tenderness. Normal range of motion.     Left foot: Decreased range of motion. Tenderness and bony tenderness (lateral, superiorly) present.     Comments: No step-off noted with palpation of spine. There is point tenderness over the lateral calcaneus of the left foot and  patient winces in withdrawals when I push on this area.  No  ecchymosis of the foot or ankle.  Skin:    General: Skin is warm and dry.     Findings: No rash.  Neurological:     Mental Status: She is alert.     Sensory: No sensory deficit.     Deep Tendon Reflexes: Reflexes are normal and symmetric.     Comments: 5/5 strength in entire lower extremities bilaterally. No sensation deficit.      ED Results / Procedures / Treatments   Labs (all labs ordered are listed, but only abnormal results are displayed) Labs Reviewed - No data to display  EKG None  Radiology DG Lumbar Spine Complete  Result Date: 06/02/2019 CLINICAL DATA:  Fall. EXAM: LUMBAR SPINE - COMPLETE 4+ VIEW COMPARISON:  Lumbar spine x-rays dated July 06, 2017. FINDINGS: Unchanged transitional thoracolumbar and lumbosacral anatomy. No acute fracture or subluxation. Vertebral body heights are preserved. Alignment is normal.  Intervertebral disc spaces are maintained. The sacroiliac joints are unremarkable.  Prior cholecystectomy. IMPRESSION: No acute osseous abnormality. Electronically Signed   By: Titus Dubin M.D.   On: 06/02/2019 05:38   DG Ankle Complete Left  Result Date: 06/02/2019 CLINICAL DATA:  Fall. EXAM: LEFT ANKLE COMPLETE - 3+ VIEW COMPARISON:  Left foot x-rays dated October 08, 2017. FINDINGS: No acute fracture or dislocation. Unchanged Lisfranc hardware. The ankle mortise is symmetric. The talar dome is intact. No tibiotalar joint effusion. Joint spaces are preserved. Mild osteopenia. Soft tissues are unremarkable. Vascular calcifications. IMPRESSION: No acute osseous abnormality. Electronically Signed   By: Titus Dubin M.D.   On: 06/02/2019 05:34   DG Foot Complete Left  Result Date: 06/02/2019 CLINICAL DATA:  Fall. EXAM: LEFT FOOT - COMPLETE 3+ VIEW COMPARISON:  Left foot x-rays dated October 08, 2017. FINDINGS: Suspected small acute nondisplaced intra-articular corner fracture of the lateral calcaneus at the calcaneocuboid joint. No additional acute fracture.  Healed fracture of the third proximal phalanx. Prior Lisfranc ORIF and first TMT joint arthrodesis with unchanged lucency surrounding the screws. New mild first MTP joint osteoarthritis. Mild osteopenia. Soft tissues are unremarkable. Vascular calcifications. IMPRESSION: 1. Suspected small acute nondisplaced intra-articular corner fracture of the lateral calcaneus at the calcaneocuboid joint. 2. Prior Lisfranc ORIF and first TMT joint arthrodesis with chronic screw loosening and no solid fusion. Electronically Signed   By: Titus Dubin M.D.   On: 06/02/2019 05:42    Procedures Procedures (including critical care time)  Medications Ordered in ED Medications  oxyCODONE (Oxy IR/ROXICODONE) immediate release tablet 5 mg (has no administration in time range)    ED Course  I have reviewed the triage vital signs and the nursing notes.  Pertinent labs & imaging results that were available during my care of the patient were reviewed by me and considered in my medical decision making (see chart for details).  Patient seen and examined.  X-rays reviewed.  Patient does have point tenderness in the area of the nondisplaced calcaneal fracture.  Will place patient in a Jones dressing, make nonweightbearing.  Discussed that it will be very important for her to follow-up with either Dr. Sharol Given or the on-call orthopedic doctor for definitive management.  Patient has been in the emergency department for several hours and was prescribed a dose of her chronic pain medication.  This was confirmed by the [facility] substance reporting database.  Discussed other modalities for pain, elevation, ice.  Pt allergic to NSAIDs.   Vital  signs reviewed and are as follows: BP 127/67   Pulse 84   Temp 98.5 F (36.9 C)   Resp 16   Ht '5\' 4"'$  (1.626 m)   Wt 85 kg   SpO2 100%   BMI 32.17 kg/m   10:41 AM Pt seen after dressing placed. Appears adequate. No sign of compromise in foot. Reviewed referrals, follow-up plan.      MDM Rules/Calculators/A&P                      Patient with foot fracture, likely foot sprain and ankle sprain, lower back strain.  No red flags of back pain.  Treatment as above.  Will require orthopedic follow-up.  Left lower extremity and right lower extremity with neurovascularly intact with good pulses and distal sensation intact.    Final Clinical Impression(s) / ED Diagnoses Final diagnoses:  Closed nondisplaced intra-articular fracture of left calcaneus, initial encounter  Sprain of left ankle, unspecified ligament, initial encounter  Strain of lumbar region, initial encounter    Rx / DC Orders ED Discharge Orders    None        Carlisle Cater, PA-C 06/02/19 1042    Tegeler, Gwenyth Allegra, MD 06/02/19 1640

## 2019-06-02 NOTE — ED Triage Notes (Signed)
Patient slipped and fell on wet floor at home last night , denies LOC / ambulatory , reports left ankle and low back pain .

## 2019-06-02 NOTE — Progress Notes (Signed)
Orthopedic Tech Progress Note Patient Details:  Maureen [name] 1971-01-15 [id number]  Ortho Devices Type of Ortho Device: Cotton web roll, Ace wrap, Crutches, Doran Durand splint Ortho Device/Splint Location: LLE Ortho Device/Splint Interventions: Ordered, Application   Post Interventions Patient Tolerated: Well, Ambulated well Instructions Provided: Poper ambulation with device, Care of device   Janit Pagan 06/02/2019, 10:34 AM

## 2019-06-02 NOTE — Discharge Instructions (Signed)
Please read and follow all provided instructions.  Your diagnoses today include:  1. Closed nondisplaced intra-articular fracture of left calcaneus, initial encounter   2. Sprain of left ankle, unspecified ligament, initial encounter   3. Strain of lumbar region, initial encounter     Tests performed today include:  An x-ray of the affected areas -no fractures in the ankle or lower back.  You do have a fracture of the outer part of the calcaneus bone in your foot.  This will require splinting and follow-up with an orthopedic doctor to ensure that it appropriately heals.  Vital signs. See below for your results today.   Medications prescribed:   None  Take any prescribed medications only as directed.  Home care instructions:   Follow any educational materials contained in this packet  It is important that you use the crutches until you are cleared to bear weight by orthopedic doctor  Follow R.I.C.E. Protocol:  R - rest your injury   I  - use ice on injury without applying directly to skin  C - compress injury with bandage or splint  E - elevate the injury as much as possible  Follow-up instructions: Please follow-up with Dr. Sharol Given if you can as he has operated on your foot in the past.  If you are unable to follow-up with Dr. Sharol Given, please call Dr. Stann Mainland who is our on-call orthopedic referral today.  Return instructions:   Please return if your toes or feet are numb or tingling, appear gray or blue, or you have severe pain (also elevate the leg and loosen splint or wrap if you were given one)  Please return to the Emergency Department if you experience worsening symptoms.   Please return if you have any other emergent concerns.  Additional Information:  Your vital signs today were: BP 127/67   Pulse 84   Temp 98.5 F (36.9 C)   Resp 16   Ht 5\' 4"  (1.626 m)   Wt 85 kg   SpO2 100%   BMI 32.17 kg/m  If your blood pressure (BP) was elevated above 135/85 this  visit, please have this repeated by your doctor within one month. --------------

## 2019-06-06 ENCOUNTER — Ambulatory Visit: Payer: Medicare Other | Admitting: Physician Assistant

## 2019-06-06 ENCOUNTER — Encounter: Payer: Self-pay | Admitting: Orthopedic Surgery

## 2019-06-06 ENCOUNTER — Other Ambulatory Visit: Payer: Self-pay

## 2019-06-06 DIAGNOSIS — S92002A Unspecified fracture of left calcaneus, initial encounter for closed fracture: Secondary | ICD-10-CM | POA: Diagnosis not present

## 2019-06-06 NOTE — Progress Notes (Signed)
Office Visit Note   Patient: Maureen [name]           Date of Birth: [birth year]           MRN: [id number] Visit Date: 06/06/2019              Requested by: Vevelyn Francois, NP [address] [place],  [place] [address] PCP: Vevelyn Francois, NP  Chief Complaint  Patient presents with  . Left Foot - Pain    DOI 06/01/19 folloe up ER visit nondisplaced intraarticular corner lateral calcaneus fx       HPI: This is a pleasant 49 year old woman with a chief complaint of left lateral foot pain.  She is 4 years status post ORIF left foot Lisfranc injury.  5 days ago she was mopping her floor and it was not quite dry and she slipped and fell.  She was seen and evaluated in the emergency room.  X-rays of her back ankle and foot were taken.  Only positive finding was of a small intra-articular calcaneus fracture at the calcaneocuboid joint which was nondisplaced she has been nonweightbearing since  Assessment & Plan: Visit Diagnoses: No diagnosis found.  Plan: She will go into a postop shoe today make weight-bear as tolerated.  She will follow-up in 3 weeks new x-rays of her foot should be taken at that time  Follow-Up Instructions: No follow-ups on file.   Ortho Exam  Patient is alert, oriented, no adenopathy, well-dressed, normal affect, normal respiratory effort.  She is tender over the calcaneocuboid joint.  Mild soft tissue swelling [place] skin is in good condition.  Some mild tenderness over the medial side of the ankle.  Below the medial malleolus in the area of the deltoid.  Imaging of her ankle and foot were reviewed.  Demonstrating a nondisplaced calcaneus fracture at the calcaneocuboid joint  Imaging: No results found. No images are attached to the encounter.  Labs: Lab Results  Component Value Date   HGBA1C 11.7 (H) 05/02/2019   HGBA1C 10.4 (H) 02/01/2018   HGBA1C 9.3 (H) 03/05/2016   ESRSEDRATE 24 (H) 02/14/2013   ESRSEDRATE 28 (H) 12/18/2012   ESRSEDRATE 48 (H)  05/16/2008   REPTSTATUS 06/18/2016 FINAL 06/17/2016   CULT MULTIPLE SPECIES PRESENT, SUGGEST RECOLLECTION (A) 06/17/2016   LABORGA STAPHYLOCOCCUS SPECIES (COAGULASE NEGATIVE) 07/18/2013     Lab Results  Component Value Date   ALBUMIN 2.9 (L) 02/03/2018   ALBUMIN 3.0 (L) 02/02/2018   ALBUMIN 3.8 01/31/2018    Lab Results  Component Value Date   MG 1.8 02/03/2018   MG 1.8 02/02/2018   MG 2.1 05/18/2012   No results found for: VD25OH  No results found for: PREALBUMIN CBC EXTENDED Latest Ref Rng & Units 05/03/2019 05/01/2019 05/01/2019  WBC 4.0 - 10.5 K/uL 10.0 - 14.3(H)  RBC 3.87 - 5.11 MIL/uL 4.23 - 4.43  HGB 12.0 - 15.0 g/dL 13.3 14.6 14.3  HCT 36.0 - 46.0 % 40.7 43.0 42.2  PLT 150 - 400 K/uL 311 - 330  NEUTROABS 1.7 - 7.7 K/uL - - 12.3(H)  LYMPHSABS 0.7 - 4.0 K/uL - - 1.2     There is no height or weight on file to calculate BMI.  Orders:  No orders of the defined types were placed in this encounter.  No orders of the defined types were placed in this encounter.    Procedures: No procedures performed  Clinical Data: No additional findings.  ROS:  All other systems  negative, except as noted in the HPI. Review of Systems  Objective: Vital Signs: There were no vitals taken for this visit.  Specialty Comments:  No specialty comments available.  PMFS History: Patient Active Problem List   Diagnosis Date Noted  . Acute metabolic encephalopathy 99991111  . Chronic anticoagulation 05/02/2019  . AMS (altered mental status) 05/01/2019  . Vision loss 02/02/2018  . Acute loss of vision 02/01/2018  . Leukocytosis 02/01/2018  . Hyperglycemia 02/01/2018  . Hyperbilirubinemia 02/01/2018  . VTE (venous thromboembolism) 02/01/2018  . Anxiety and depression 02/01/2018  . Orthostatic hypotension 02/01/2018  . Uncontrolled type 1 diabetes mellitus with hyperglycemia, with long-term current use of insulin ([place]) 02/01/2018  . OSA (obstructive sleep apnea) 05/14/2016    . Obesity (BMI 30-39.9) 05/14/2016  . Acute deep vein thrombosis (DVT) of femoral vein of left lower extremity ([place]) 02/10/2016  . Diabetic polyneuropathy associated with type 2 diabetes mellitus ([place]) 02/10/2016  . Lisfranc dislocation, left, sequela 12/16/2015  . Short Achilles tendon (acquired), left ankle 12/16/2015  . Chest pain with moderate risk for cardiac etiology 11/29/2015  . Elevated troponin 11/19/2015  . Migraine headache 11/19/2015  . Neck pain 11/19/2015  . Acute kidney injury ([place]) 02/19/2015  . DKA (diabetic ketoacidoses) ([place]) 10/24/2014  . HLD (hyperlipidemia) 10/24/2014  . History of pulmonary embolus (PE) 09/28/2014  . Abnormal LFTs 09/03/2014  . Dizziness 09/03/2014  . Acute pulmonary embolism ([place]) 09/03/2014  . Transaminitis 09/02/2014  . GAD (generalized anxiety disorder) 11/23/2013  . PTSD (post-traumatic stress disorder) 11/23/2013  . Severe major depression without psychotic features ([place]) 10/10/2013  . Endometriosis 12/08/2011  . Arthritis of left knee 12/08/2011  . Diabetic retinopathy ([place]) 12/08/2011  . Chronic pain 10/23/2011  . DM type 1 (diabetes mellitus, type 1) ([place]) 10/13/2011   Past Medical History:  Diagnosis Date  . Abdominal pain   . Achilles tendon contracture, left    with Lisfranc fracture  . Anxiety   . Arthritis   . Biliary dyskinesia   . Cervical strain   . Closed head injury 2010  . Depression   . DVT (deep venous thrombosis) ([place])   . Elevated LFTs   . Fibromyalgia   . GERD (gastroesophageal reflux disease)   . History of kidney stones   . Hyperlipidemia   . Hypoglycemia   . Joint pain   . Major depressive disorder   . Migraine    hx of none recent  . Nausea & vomiting   . OSA on CPAP 05/14/2016   Moderate to severe OSA with an AHI of 28/hr now on CPAP at 19cm H2O  . Pneumonia   . Post traumatic stress disorder (PTSD)   . Pulmonary embolism ([place])   . Renal stones   . Skin rash    due to medications  . Sleep  trouble   . Stroke [facility])    " series of mini strokes around 2007"  . TIA (transient ischemic attack)   . Wears glasses   . 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 Cancer Father        lymphoma  . Nephrolithiasis Father   . Vascular Disease Father   . Alcohol abuse Father   . Drug abuse Father   . Cancer Maternal Grandmother        colon  . Hypertension Mother   . Heart disease Mother   .  Cataracts Mother   . Depression Mother   . Diabetes Brother   . Drug abuse Brother   . Mental illness Maternal Grandfather   . Cancer Maternal Grandfather   . Heart disease Paternal Grandmother   . Heart disease Paternal Grandfather   . Suicidality Maternal Uncle     Past Surgical History:  Procedure Laterality Date  . ABDOMINAL HYSTERECTOMY  2001  . APPENDECTOMY    . [place]  . CHOLECYSTECTOMY  02/10/2011   Procedure: LAPAROSCOPIC CHOLECYSTECTOMY WITH INTRAOPERATIVE CHOLANGIOGRAM;  Surgeon: Judieth Keens, DO;  Location: [facility];  Service: General;  Laterality: N/A;  . COLONOSCOPY    . DILATION AND CURETTAGE OF UTERUS    . exploratory laparomty  Mar 04, 2012  . EYE SURGERY  2006   laser eye surgery left eye  . KNEE SURGERY Left 2012  . LAPAROSCOPIC APPENDECTOMY N/A 10/12/2012   Procedure: DIAGNOSTIC APPENDECTOMY LAPAROSCOPIC;  Surgeon: Madilyn Hook, DO;  Location: [facility];  Service: General;  Laterality: N/A;  . lipoma removed  yrs ago  . LIVER BIOPSY    . ORIF ANKLE FRACTURE Left 01/03/2016   Procedure: OPEN REDUCTION INTERNAL FIXATION (ORIF) LEFT LISFRANC JOINT, GASTROC RECESSION;  Surgeon: Newt Minion, MD;  Location: [facility];  Service: Orthopedics;  Laterality: Left;  . ROBOTIC ASSISTED LAPAROSCOPIC LYSIS OF ADHESION N/A 03/04/2012   Procedure: ROBOTIC ASSISTED LAPAROSCOPIC LYSIS OF EXTENSIVE ADHESIONS;  Surgeon: Claiborne Billings A. Pamala Hurry, MD;  Location: [facility];  Service:  Gynecology;  Laterality: N/A;  . TONSILLECTOMY  2001   Social History   Occupational History  . Occupation: stay at home mom    Employer: NOT EMPLOYED  Tobacco Use  . Smoking status: Never Smoker  . Smokeless tobacco: Never Used  Substance and Sexual Activity  . Alcohol use: Yes    Comment: occ  . Drug use: No  . Sexual activity: Not on file

## 2019-06-27 ENCOUNTER — Ambulatory Visit: Payer: Medicare Other | Admitting: Orthopedic Surgery

## 2019-07-05 MED FILL — XARELTO 20 MG TABLET: 20 | 30 days supply | Qty: 30 | Fill #1

## 2019-07-31 ENCOUNTER — Ambulatory Visit (INDEPENDENT_AMBULATORY_CARE_PROVIDER_SITE_OTHER): Payer: Medicare Other | Admitting: Nurse Practitioner

## 2019-07-31 ENCOUNTER — Other Ambulatory Visit: Payer: Self-pay | Admitting: Nurse Practitioner

## 2019-07-31 ENCOUNTER — Other Ambulatory Visit: Payer: Self-pay

## 2019-07-31 ENCOUNTER — Encounter: Payer: Self-pay | Admitting: Nurse Practitioner

## 2019-07-31 VITALS — BP 151/71 | HR 72 | Temp 98.1°F | Ht 64.0 in | Wt 180.0 lb

## 2019-07-31 DIAGNOSIS — Z7901 Long term (current) use of anticoagulants: Secondary | ICD-10-CM

## 2019-07-31 DIAGNOSIS — R809 Proteinuria, unspecified: Secondary | ICD-10-CM | POA: Diagnosis not present

## 2019-07-31 DIAGNOSIS — E782 Mixed hyperlipidemia: Secondary | ICD-10-CM | POA: Diagnosis not present

## 2019-07-31 DIAGNOSIS — R82998 Other abnormal findings in urine: Secondary | ICD-10-CM

## 2019-07-31 DIAGNOSIS — I10 Essential (primary) hypertension: Secondary | ICD-10-CM

## 2019-07-31 DIAGNOSIS — G4733 Obstructive sleep apnea (adult) (pediatric): Secondary | ICD-10-CM | POA: Diagnosis not present

## 2019-07-31 DIAGNOSIS — E1029 Type 1 diabetes mellitus with other diabetic kidney complication: Secondary | ICD-10-CM

## 2019-07-31 LAB — POCT GLYCOSYLATED HEMOGLOBIN (HGB A1C)
HbA1c POC (<> result, manual entry): 9.4 % (ref 4.0–5.6)
HbA1c, POC (controlled diabetic range): 9.4 % — AB (ref 0.0–7.0)
HbA1c, POC (prediabetic range): 9.4 % — AB (ref 5.7–6.4)
Hemoglobin A1C: 9.4 % — AB (ref 4.0–5.6)

## 2019-07-31 LAB — POCT URINALYSIS DIPSTICK
Bilirubin, UA: NEGATIVE
Glucose, UA: NEGATIVE
Ketones, UA: NEGATIVE
Nitrite, UA: NEGATIVE
Protein, UA: NEGATIVE
Spec Grav, UA: 1.02 (ref 1.010–1.025)
Urobilinogen, UA: 0.2 E.U./dL
pH, UA: 6.5 (ref 5.0–8.0)

## 2019-07-31 LAB — GLUCOSE, POCT (MANUAL RESULT ENTRY): POC Glucose: 122 mg/dl — AB (ref 70–99)

## 2019-07-31 MED ORDER — INSULIN LISPRO (1 UNIT DIAL) 100 UNIT/ML (KWIKPEN): 8.0000 [IU] | PEN_INJECTOR | Freq: Three times a day (TID) | SUBCUTANEOUS | 5 refills | Status: DC

## 2019-07-31 NOTE — Progress Notes (Signed)
 [facility] [address] [place], [place]  [address] Phone:  [phone]   Fax:  [phone]   Established Patient Office Visit  Subjective:  Patient ID: Maureen [name], female    DOB: 10/16/1970  Age: 48 y.o. MRN: [id number]  CC:  Chief Complaint  Patient presents with  . Follow-up    2 month follow up, wants to discusss her dx Acute metabolic encephalopathy  . Medication Refill    Requesting to change insulin to the pens instead of vials.     HPI Maureen [name] presents for follow up. She  has a past medical history of Abdominal pain, Achilles tendon contracture, left, Anxiety, Arthritis, Biliary dyskinesia, Cervical strain, Closed head injury (2010), Depression, DVT (deep venous thrombosis) (HCC), Elevated LFTs, Fibromyalgia, GERD (gastroesophageal reflux disease), History of kidney stones, Hyperlipidemia, Hypoglycemia, Joint pain, Major depressive disorder, Migraine, Nausea & vomiting, OSA on CPAP (05/14/2016), Pneumonia, Post traumatic stress disorder (PTSD), Pulmonary embolism (HCC), Renal stones, Skin rash, Sleep trouble, Stroke (HCC), TIA (transient ischemic attack), Wears glasses, and Well controlled type 1 diabetes mellitus with peripheral neuropathy (HCC).   Diabetes Mellitus Patient presents for follow up of diabetes. Current symptoms include: hyperglycemia, paresthesia of the feet and visual disturbances. Symptoms have stabilized. Patient denies foot ulcerations, nausea and vomiting. Evaluation to date has included: fasting blood sugar, fasting lipid panel, hemoglobin A1C and microalbuminuria.  Home sugars: BGs are running  consistent with Hgb A1C. Current treatment: Continued insulin which has been somewhat effective and Continued statin which has been somewhat effective.   Depression Patient complains of depression. She complains of depressed mood. Onset was approximately several months ago. Symptoms have been gradually improving since that  time. Current symptoms include: insomnia. Patient denies anhedonia, difficulty concentrating, feelings of worthlessness/guilt, hopelessness, impaired memory, suicidal attempt and weight loss. Possible organic causes contributing are: drug abuse. Risk factors: negative life event death of her brother 2018 increased stress from parents; caregiver. Previous treatment includes medication. She complains of the following side effects from the treatment: weight gain.   Past Medical History:  Diagnosis Date  . Abdominal pain   . Achilles tendon contracture, left    with Lisfranc fracture  . Anxiety   . Arthritis   . Biliary dyskinesia   . Cervical strain   . Closed head injury 2010  . Depression   . DVT (deep venous thrombosis) (HCC)   . Elevated LFTs   . Fibromyalgia   . GERD (gastroesophageal reflux disease)   . History of kidney stones   . Hyperlipidemia   . Hypoglycemia   . Joint pain   . Major depressive disorder   . Migraine    hx of none recent  . Nausea & vomiting   . OSA on CPAP 05/14/2016   Moderate to severe OSA with an AHI of 28/hr now on CPAP at 19cm H2O  . Pneumonia   . Post traumatic stress disorder (PTSD)   . Pulmonary embolism (HCC)   . Renal stones   . Skin rash    due to medications  . Sleep trouble   . Stroke (HCC)    " series of mini strokes around 2007"  . TIA (transient ischemic attack)   . Wears glasses   . Well controlled type 1 diabetes mellitus with peripheral neuropathy (HCC)    Type 1    Past Surgical History:  Procedure Laterality Date  . ABDOMINAL HYSTERECTOMY  2001  . APPENDECTOMY    .   CESAREAN SECTION  1997, 1999  . CHOLECYSTECTOMY  02/10/2011   Procedure: LAPAROSCOPIC CHOLECYSTECTOMY WITH INTRAOPERATIVE CHOLANGIOGRAM;  Surgeon: Brian David Layton, DO;  Location: [facility];  Service: General;  Laterality: N/A;  . COLONOSCOPY    . DILATION AND CURETTAGE OF UTERUS    . exploratory laparomty  Mar 04, 2012  . EYE SURGERY  2006   laser eye surgery  left eye  . KNEE SURGERY Left 2012  . LAPAROSCOPIC APPENDECTOMY N/A 10/12/2012   Procedure: DIAGNOSTIC APPENDECTOMY LAPAROSCOPIC;  Surgeon: Brian Layton, DO;  Location[facility];  Service: General;  Laterality: N/A;  . lipoma removed  yrs ago  . LIVER BIOPSY    . ORIF ANKLE FRACTURE Left 01/03/2016   Procedure: OPEN REDUCTION INTERNAL FIXATION (ORIF) LEFT LISFRANC JOINT, GASTROC RECESSION;  Surgeon: Marcus V Duda, MD;  Location: [facility];  Service: Orthopedics;  Laterality: Left;  . ROBOTIC ASSISTED LAPAROSCOPIC LYSIS OF ADHESION N/A 03/04/2012   Procedure: ROBOTIC ASSISTED LAPAROSCOPIC LYSIS OF EXTENSIVE ADHESIONS;  Surgeon: Kelly A. Fogleman, MD;  Location: [facility];  Service: Gynecology;  Laterality: N/A;  . TONSILLECTOMY  2001    Family History  Problem Relation Age of Onset  . Cancer Father        lymphoma  . Nephrolithiasis Father   . Vascular Disease Father   . Alcohol abuse Father   . Drug abuse Father   . Cancer Maternal Grandmother        colon  . Hypertension Mother   . Heart disease Mother   . Cataracts Mother   . Depression Mother   . Diabetes Brother   . Drug abuse Brother   . Mental illness Maternal Grandfather   . Cancer Maternal Grandfather   . Heart disease Paternal Grandmother   . Heart disease Paternal Grandfather   . Suicidality Maternal Uncle     Social History   Socioeconomic History  . Marital status: Divorced    Spouse name: Adrian Lipsey  . Number of children: 2  . Years of education: 12+  . Highest education level: Not on file  Occupational History  . Occupation: stay at home mom    Employer: NOT EMPLOYED  Tobacco Use  . Smoking status: Never Smoker  . Smokeless tobacco: Never Used  Vaping Use  . Vaping Use: Never used  Substance and Sexual Activity  . Alcohol use: Yes    Comment: occ  . Drug use: No  . Sexual activity: Not on file  Other Topics Concern  . Not on file  Social History Narrative   Lives independently.  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  Stop working when she was diagnosed with endometriosis and had a TIA.   Social Determinants of Health   Financial Resource Strain:   . Difficulty of Paying Living Expenses:   Food Insecurity:   . Worried About Running Out of Food in the Last Year:   . Ran Out of Food in the Last Year:   Transportation Needs:   . Lack of Transportation (Medical):   . Lack of Transportation (Non-Medical):   Physical Activity:   . Days of Exercise per Week:   . Minutes of Exercise per Session:   Stress:   . Feeling of Stress :   Social Connections:   . Frequency of Communication with Friends and Family:   . Frequency of Social Gatherings with Friends and Family:   . Attends Religious Services:   .   Active Member of Clubs or Organizations:   . Attends Archivist Meetings:   Marland Kitchen Marital Status:   Intimate Partner Violence:   . Fear of Current or Ex-Partner:   . Emotionally Abused:   Marland Kitchen Physically Abused:   . Sexually Abused:     Outpatient Medications Prior to Visit  Medication Sig Dispense Refill  . acetaminophen (TYLENOL) 500 MG tablet Take 1,000 mg by mouth every 6 (six) hours as needed for mild pain.    Marland Kitchen atorvastatin (LIPITOR) 40 MG tablet Take 1 tablet (40 mg total) by mouth at bedtime. 30 tablet 2  . blood glucose meter kit and supplies Dispense based on patient and insurance preference. Use up to four times daily as directed. (FOR ICD-10 E10.9, E11.9). 1 each 0  . diphenhydrAMINE (DIPHENHIST) 25 MG tablet Take 25 mg by mouth every 6 (six) hours as needed for allergies.    Marland Kitchen escitalopram (LEXAPRO) 20 MG tablet Take 1 tablet (20 mg total) by mouth at bedtime. 30 tablet 2  . gabapentin (NEURONTIN) 300 MG capsule Take 600 mg by mouth 3 (three) times daily.     . insulin glargine (LANTUS) 100 UNIT/ML injection Inject 0.32 mLs (32 Units total) into the skin daily. 10 mL 11  . oxyCODONE (OXY IR/ROXICODONE) 5 MG immediate release  tablet Take 5-10 mg by mouth every 4 (four) hours as needed for pain.    . rivaroxaban (XARELTO) 20 MG TABS tablet Take 1 tablet (20 mg total) by mouth daily with supper. 30 tablet 2  . insulin lispro (HUMALOG) 100 UNIT/ML injection Inject 0.08 mLs (8 Units total) into the skin 3 (three) times daily with meals. 10 mL 2  . traZODone (DESYREL) 50 MG tablet Take 0.5-1 tablets (25-50 mg total) by mouth at bedtime as needed for sleep. (Patient not taking: Reported on 07/31/2019) 30 tablet 3   No facility-administered medications prior to visit.    Allergies  Allergen Reactions  . Cephalexin Anaphylaxis, Shortness Of Breath, Rash and Other (See Comments)    Pt was admitted to the hospital upon taking.   . Ace Inhibitors Cough    Previous rxn yrs ago   . Ibuprofen Nausea And Vomiting and Rash  . Meloxicam Nausea Only  . Reglan [Metoclopramide] Anxiety    ROS Review of Systems  All other systems reviewed and are negative.     Objective:    Physical Exam Vitals reviewed.  Constitutional:      General: She is not in acute distress. HENT:     Head: Normocephalic and atraumatic.     Nose: Nose normal.     Mouth/Throat:     Mouth: Mucous membranes are moist.  Cardiovascular:     Rate and Rhythm: Normal rate and regular rhythm.     Pulses: Normal pulses.     Heart sounds: Normal heart sounds.  Pulmonary:     Effort: Pulmonary effort is normal.     Breath sounds: Normal breath sounds.  Abdominal:     General: Bowel sounds are normal.     Palpations: Abdomen is soft.  Musculoskeletal:     Cervical back: Normal range of motion.     Comments: Left foot boot worn  Skin:    General: Skin is warm and dry.     Capillary Refill: Capillary refill takes less than 2 seconds.  Neurological:     General: No focal deficit present.     Mental Status: She is alert and oriented to person, place,  and time.  Psychiatric:        Mood and Affect: Mood normal.        Behavior: Behavior normal.         Thought Content: Thought content normal.        Judgment: Judgment normal.     BP (!) 151/71   Pulse 72   Temp 98.1 F (36.7 C)   Ht 5' 4" (1.626 m)   Wt 180 lb (81.6 kg)   SpO2 99%   BMI 30.90 kg/m  Wt Readings from Last 3 Encounters:  07/31/19 180 lb (81.6 kg)  06/02/19 187 lb 6.3 oz (85 kg)  05/31/19 171 lb 3.2 oz (77.7 kg)     There are no preventive care reminders to display for this patient.  There are no preventive care reminders to display for this patient.  Lab Results  Component Value Date   TSH 5.216 (H) 05/18/2012   Lab Results  Component Value Date   WBC 10.0 05/03/2019   HGB 13.3 05/03/2019   HCT 40.7 05/03/2019   MCV 96.2 05/03/2019   PLT 311 05/03/2019   Lab Results  Component Value Date   NA 136 05/03/2019   K 4.2 05/03/2019   CHLORIDE 101 06/30/2016   CO2 23 05/03/2019   GLUCOSE 294 (H) 05/03/2019   BUN 9 05/03/2019   CREATININE 0.82 05/03/2019   BILITOT 0.6 02/03/2018   ALKPHOS 82 02/03/2018   AST 25 02/03/2018   ALT 31 02/03/2018   PROT 6.4 (L) 02/03/2018   ALBUMIN 2.9 (L) 02/03/2018   CALCIUM 8.9 05/03/2019   ANIONGAP 10 05/03/2019   EGFR 72 (L) 06/30/2016   GFR 88.87 03/05/2016   Lab Results  Component Value Date   CHOL 146 02/01/2018   Lab Results  Component Value Date   HDL 49 02/01/2018   Lab Results  Component Value Date   LDLCALC 80 02/01/2018   Lab Results  Component Value Date   TRIG 87 02/01/2018   Lab Results  Component Value Date   CHOLHDL 3.0 02/01/2018   Lab Results  Component Value Date   HGBA1C 9.4 (A) 07/31/2019   HGBA1C 9.4 07/31/2019   HGBA1C 9.4 (A) 07/31/2019   HGBA1C 9.4 (A) 07/31/2019      Assessment & Plan:   Problem List Items Addressed This Visit      Respiratory   OSA (obstructive sleep apnea)     Endocrine   DM type 1 (diabetes mellitus, type 1) (HCC) - Primary   Relevant Medications   insulin lispro (HUMALOG) 100 UNIT/ML KwikPen   Other Relevant Orders   Urinalysis  Dipstick (Completed)   HgB A1c (Completed)   Glucose (CBG) (Completed)   Comp. Metabolic Panel (12) Continue with current regimen Encourage patient to continue with lifestyle modifications diet and exercise     Other   Chronic anticoagulation Continue with Xarelto as directed   HLD (hyperlipidemia)   Relevant Orders   Lipid panel Continue with current regimen atorvastatin 40 mg    Other Visit Diagnoses    Essential hypertension       Relevant Orders   Comp. Metabolic Panel (12) Continue with lifestyle modification Encourage patient to monitor blood pressure at home if weight loss is not achieved may consider antihypertensive agent of amlodipine.  Patient has had previous reaction to ACE inhibitor   Urine white blood cells increased       Relevant Orders   Urine Culture      Meds ordered   this encounter  Medications  . insulin lispro (HUMALOG) 100 UNIT/ML KwikPen    Sig: Inject 0.08 mLs (8 Units total) into the skin 3 (three) times daily.    Dispense:  7.2 mL    Refill:  5    Order Specific Question:   Supervising Provider    Answer:   Tresa Garter [[id number]]    Follow-up: Return in about 3 months (around 10/31/2019).    Vevelyn Francois, NP

## 2019-07-31 NOTE — Patient Instructions (Signed)
Hepatic Encephalopathy  Hepatic encephalopathy is a loss of brain function due to advanced liver disease. When the liver is damaged, harmful substances (toxins) can build up in the body. Some of these toxins, such as ammonia, can harm the brain. The effects of the condition depend on the type of liver damage and how severe it is. In some cases, hepatic encephalopathy can be reversed. What are the causes? Certain things can trigger or worsen hepatic encephalopathy, such as:  Infection.  Constipation.  Taking certain medicines, such as benzodiazepines.  Alcohol use.  Bleeding into the intestinal tract.  Imbalances in minerals (electrolytes) in the body.  Dehydration. Hepatic encephalopathy can sometimes be reversed if these triggers are resolved. What increases the risk? You are at risk of developing this condition if you have advanced liver disease (cirrhosis). Conditions that can cause liver disease include:  Infections in the liver, such as hepatitis C.  Infections in the blood.  Drinking a lot of alcohol over a long period of time.  Taking certain medicines, including tranquilizers, diuretics, antidepressants, sleeping pills, or acetaminophen.  Genetic diseases, such as Wilson's disease. What are the signs or symptoms? Symptoms may develop suddenly. Or, they may develop slowly and get worse gradually. Symptoms can range from mild to severe. Mild symptoms include:  Mild confusion.  Shortened attention span.  Personality and mood changes.  Anxiety and agitation.  Drowsiness. Symptoms of worsening or severe hepatic encephalopathy include:  Extreme confusion (disorientation).  Slowed movement.  Slurred speech.  Extreme personality changes.  Abnormal shaking or flapping of the hands.  Coma. How is this diagnosed? This condition may be diagnosed based on:  A physical exam.  Your symptoms and medical history.  Blood tests. These may be done to check levels  of ammonia in your blood, measure how long it takes your blood to clot, or check for infection.  Liver function tests. These may be done to check how well your liver is working.  MRI and CT scans. These may be done to check for a brain disorder and to check for problems with your liver.  Electroencephalogram (EEG). This test measures the electrical activity in your brain. How is this treated? The first step in treatment is to identify and treat the cause of your liver damage or triggering illness, if possible. The next step is taking medicine to lower the level of toxins in your body and prevent ammonia from building up. Treatment will depend on how severe your encephalopathy is, and may include:  Medicine to lower your ammonia level (lactulose).  Antibiotic medicine to reduce the amount of ammonia-producing bacteria in your gut.  Close monitoring of your blood pressure, heart rate, breathing, and oxygen levels.  Removal of fluid from your abdomen.  Close monitoring of how you think, feel, and act (mental status).  Dietary changes.  Liver transplant, in severe cases. Follow these instructions at home: Eating and drinking   Work with a dietitian or with your health care provider to make sure you are getting the right balance of protein and minerals.  Drink enough fluids to keep your urine pale yellow.  Do not drink alcohol or use drugs. General instructions  If you were prescribed an antibiotic, take it as told by your health care provider. Do not stop taking the antibiotic even if your condition improves.  Take other over-the-counter and prescription medicines only as told by your health care provider.  Do not start taking any new medicines, including over-the-counter medicines, without first   checking with your health care provider.  Keep all follow-up visits as told by your health care provider. This is important. Contact a health care provider if:  You develop new  symptoms.  Your symptoms change or get worse.  You have a fever.  You are constipated. Signs of constipation include having: ? Fewer bowel movements in a week than normal. ? Trouble having a bowel movement. ? Stools that are dry, hard, or larger than normal.  You have persistent nausea, vomiting, or diarrhea. Get help right away if:  You become very confused or drowsy.  You vomit blood or material that looks like coffee grounds.  Your stool is bloody, black, or looks like tar. Summary  Hepatic encephalopathy is a loss of brain function due to advanced liver disease. When the liver is damaged, harmful substances (toxins) can build up in your body. Some of these toxins, such as ammonia, can harm your brain.  Certain things can trigger or worsen hepatic encephalopathy. Hepatic encephalopathy can sometimes be reversed if these triggers are resolved.  The first step in treatment is to identify and treat the cause of your liver damage or triggering illness, if possible. The next step is taking medicine to lower the level of toxins in your body and prevent ammonia from building up.  Your treatment will depend on how severe your hepatic encephalopathy is. This information is not intended to replace advice given to you by your health care provider. Make sure you discuss any questions you have with your health care provider. Document Revised: 12/18/2016 Document Reviewed: 10/06/2016 Elsevier Patient Education  2020 [employer]  

## 2019-08-01 LAB — COMP. METABOLIC PANEL (12)
AST: 29 IU/L (ref 0–40)
Albumin/Globulin Ratio: 1.4 (ref 1.2–2.2)
Albumin: 4.2 g/dL (ref 3.8–4.8)
Alkaline Phosphatase: 151 IU/L — ABNORMAL HIGH (ref 48–121)
BUN/Creatinine Ratio: 11 (ref 9–23)
BUN: 9 mg/dL (ref 6–24)
Bilirubin Total: 0.2 mg/dL (ref 0.0–1.2)
Calcium: 9.5 mg/dL (ref 8.7–10.2)
Chloride: 101 mmol/L (ref 96–106)
Creatinine, Ser: 0.82 mg/dL (ref 0.57–1.00)
GFR calc Af Amer: 99 mL/min/{1.73_m2} (ref >59)
GFR calc non Af Amer: 86 mL/min/{1.73_m2} (ref >59)
Globulin, Total: 3 g/dL (ref 1.5–4.5)
Glucose: 136 mg/dL — ABNORMAL HIGH (ref 65–99)
Potassium: 4.5 mmol/L (ref 3.5–5.2)
Sodium: 141 mmol/L (ref 134–144)
Total Protein: 7.2 g/dL (ref 6.0–8.5)

## 2019-08-01 LAB — LIPID PANEL
Chol/HDL Ratio: 2.2 ratio (ref 0.0–4.4)
Cholesterol, Total: 171 mg/dL (ref 100–199)
HDL: 79 mg/dL (ref >39)
LDL Chol Calc (NIH): 77 mg/dL (ref 0–99)
Triglycerides: 84 mg/dL (ref 0–149)
VLDL Cholesterol Cal: 15 mg/dL (ref 5–40)

## 2019-08-02 LAB — URINE CULTURE

## 2019-08-04 MED FILL — LANTUS 100 UNITS/ML VIAL: 100 | 28 days supply | Qty: 10 | Fill #1

## 2019-08-04 MED FILL — XARELTO 20 MG TABLET: 20 | 30 days supply | Qty: 30 | Fill #2

## 2019-08-04 MED FILL — HumaLOG 100 UNIT/ML SOLN: 100 | 28 days supply | Qty: 10 | Fill #1

## 2019-08-11 IMAGING — DX DG FOOT COMPLETE 3+V*L*
3 series · 3 of 3 positions shown · non-contrast
Comparison: Left foot x-rays dated October 17, 2015.

CLINICAL DATA: Anterior foot pain after hitting it on a bed a few
days ago.

EXAM:
LEFT FOOT - COMPLETE 3+ VIEW

[foot ap]
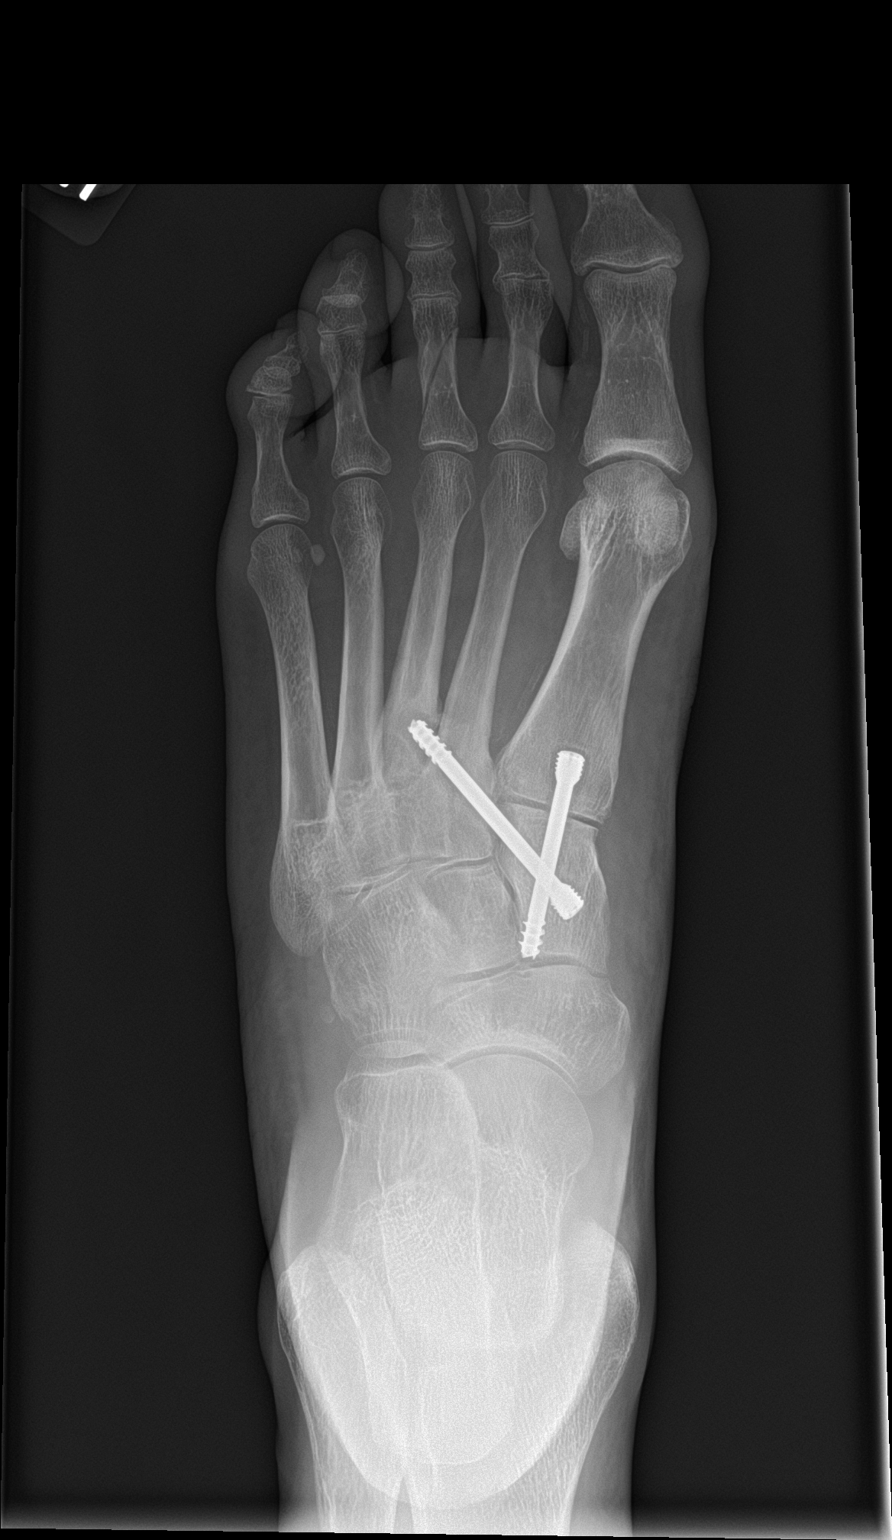

[foot obl]
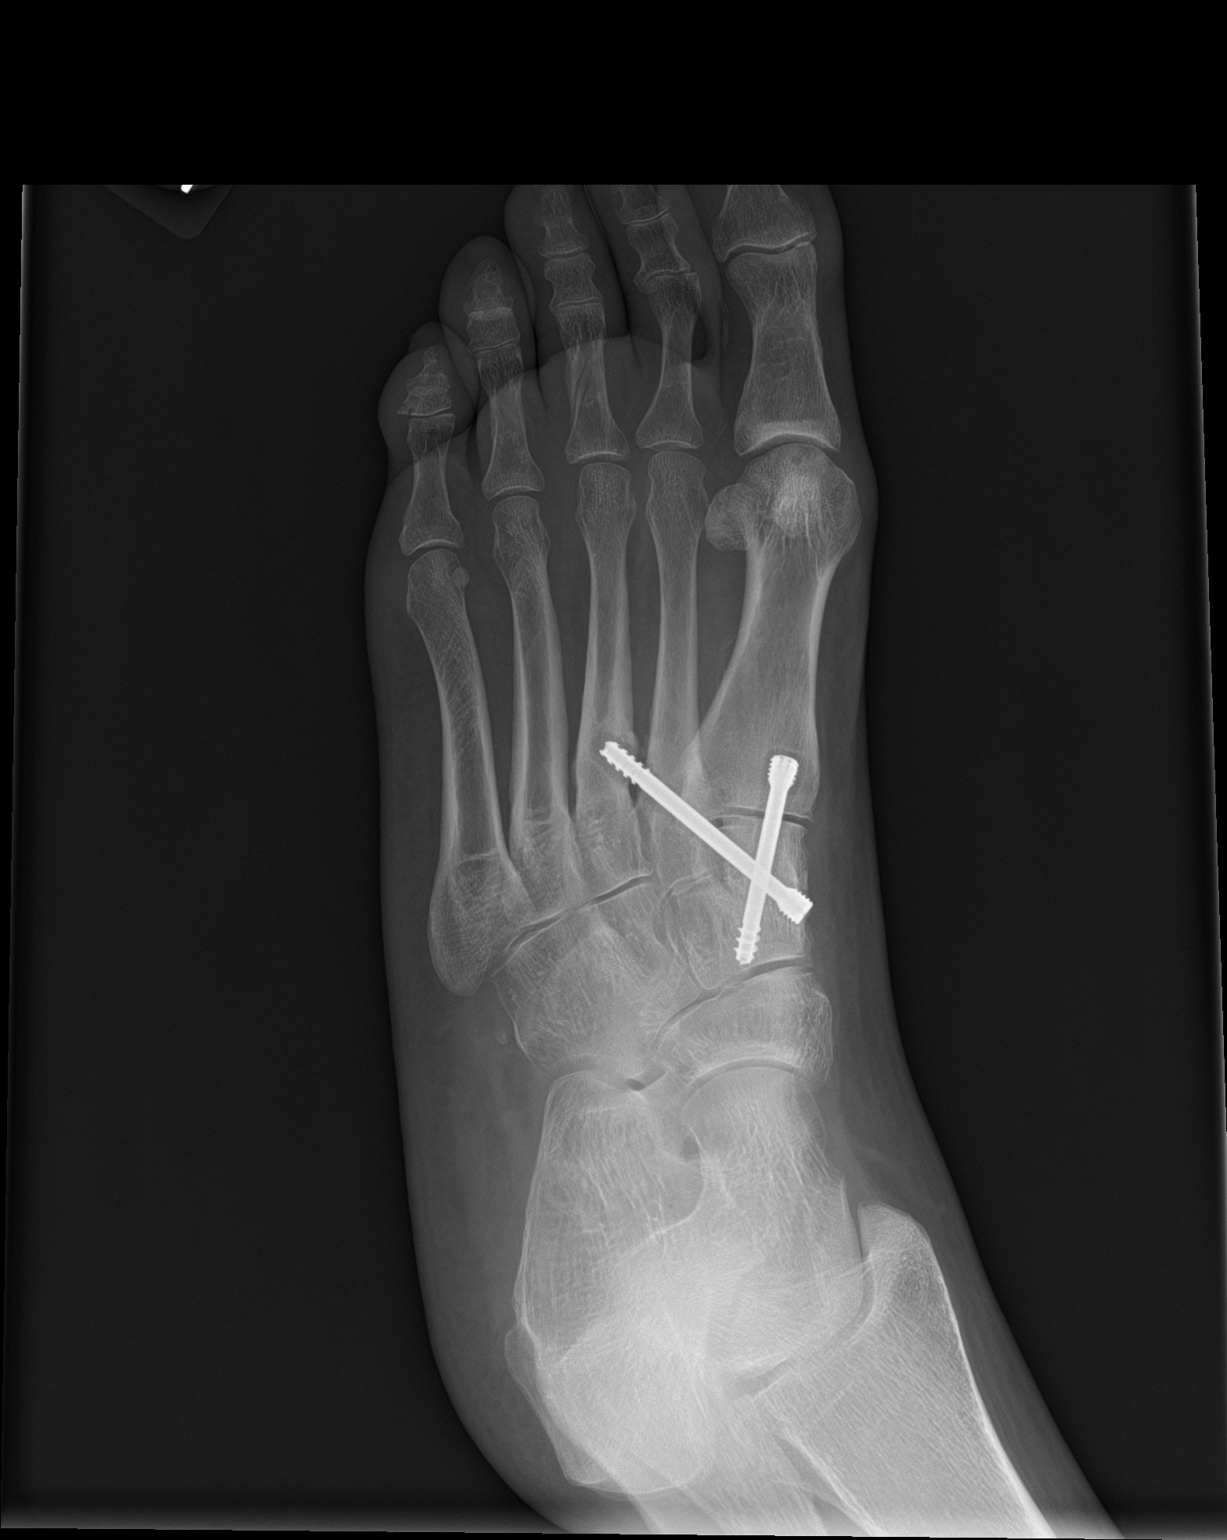

[foot lat]
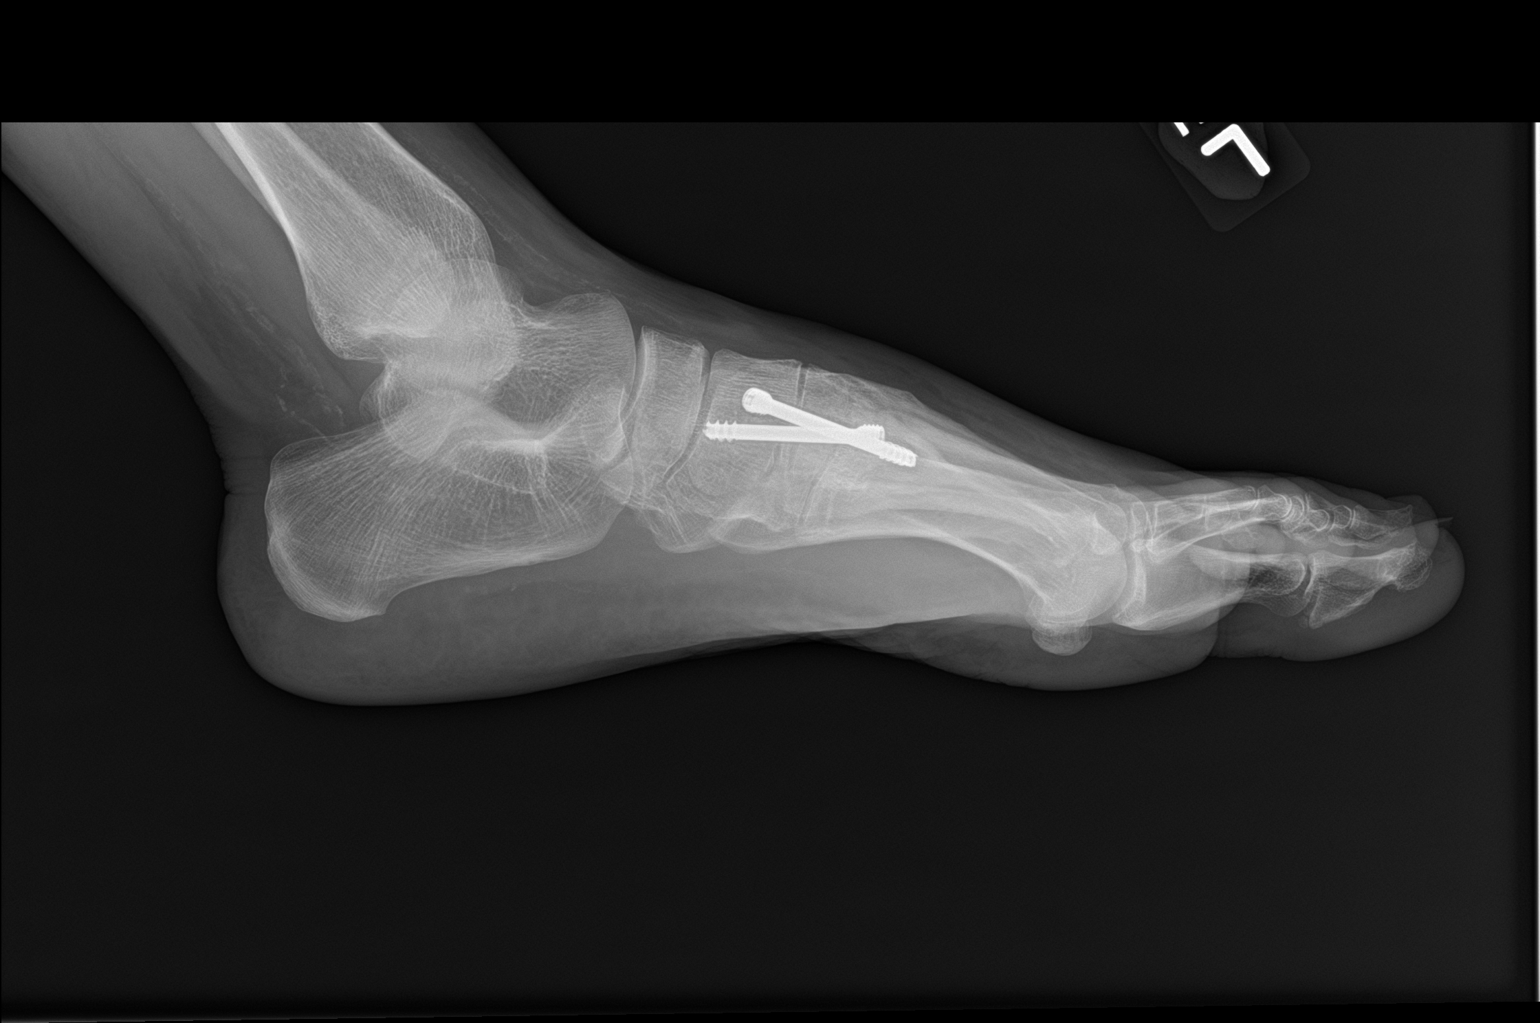

[3 of 3 positions shown; findings below may reference images not displayed]

FINDINGS: Nondisplaced oblique fracture through the third proximal phalanx.
Interval Lisfranc repair and first tarsometatarsal joint arthrodesis
with mild lucency surrounding the screws and no evidence of solid
fusion. Joint spaces are preserved. Mild osteopenia. Soft tissues
are unremarkable. Vascular calcifications.
IMPRESSION: 1. Acute nondisplaced fracture of the third proximal phalanx.
2. Interval Lisfranc repair and first tarsometatarsal joint
arthrodesis with evidence of loosening and no solid fusion.

## 2019-09-19 IMAGING — CR DG CHEST 2V
2 series · 2 of 2 positions shown · non-contrast
Comparison: June 17, 2016

CLINICAL DATA: Chest pain for 4 days

EXAM:
CHEST - 2 VIEW

[w chest pa]
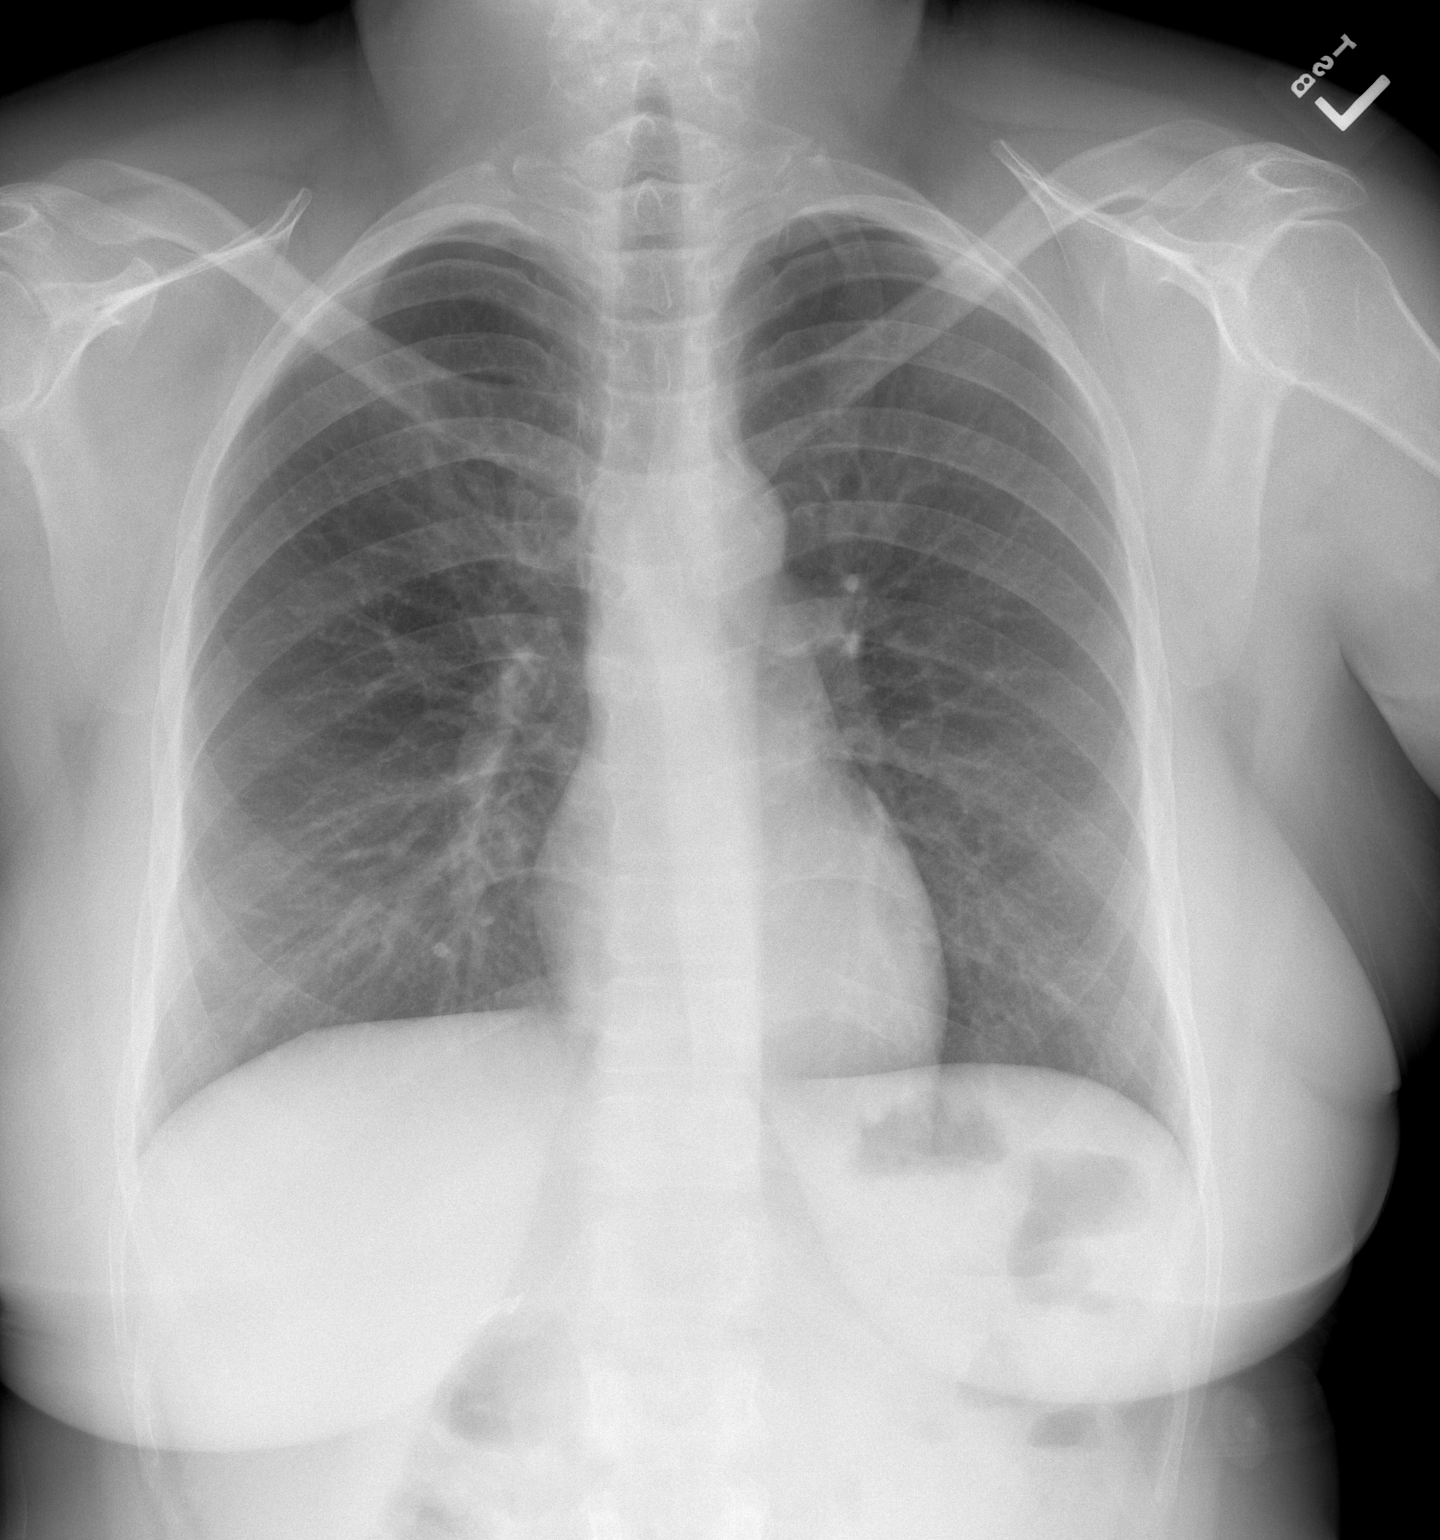

[w chest lat]
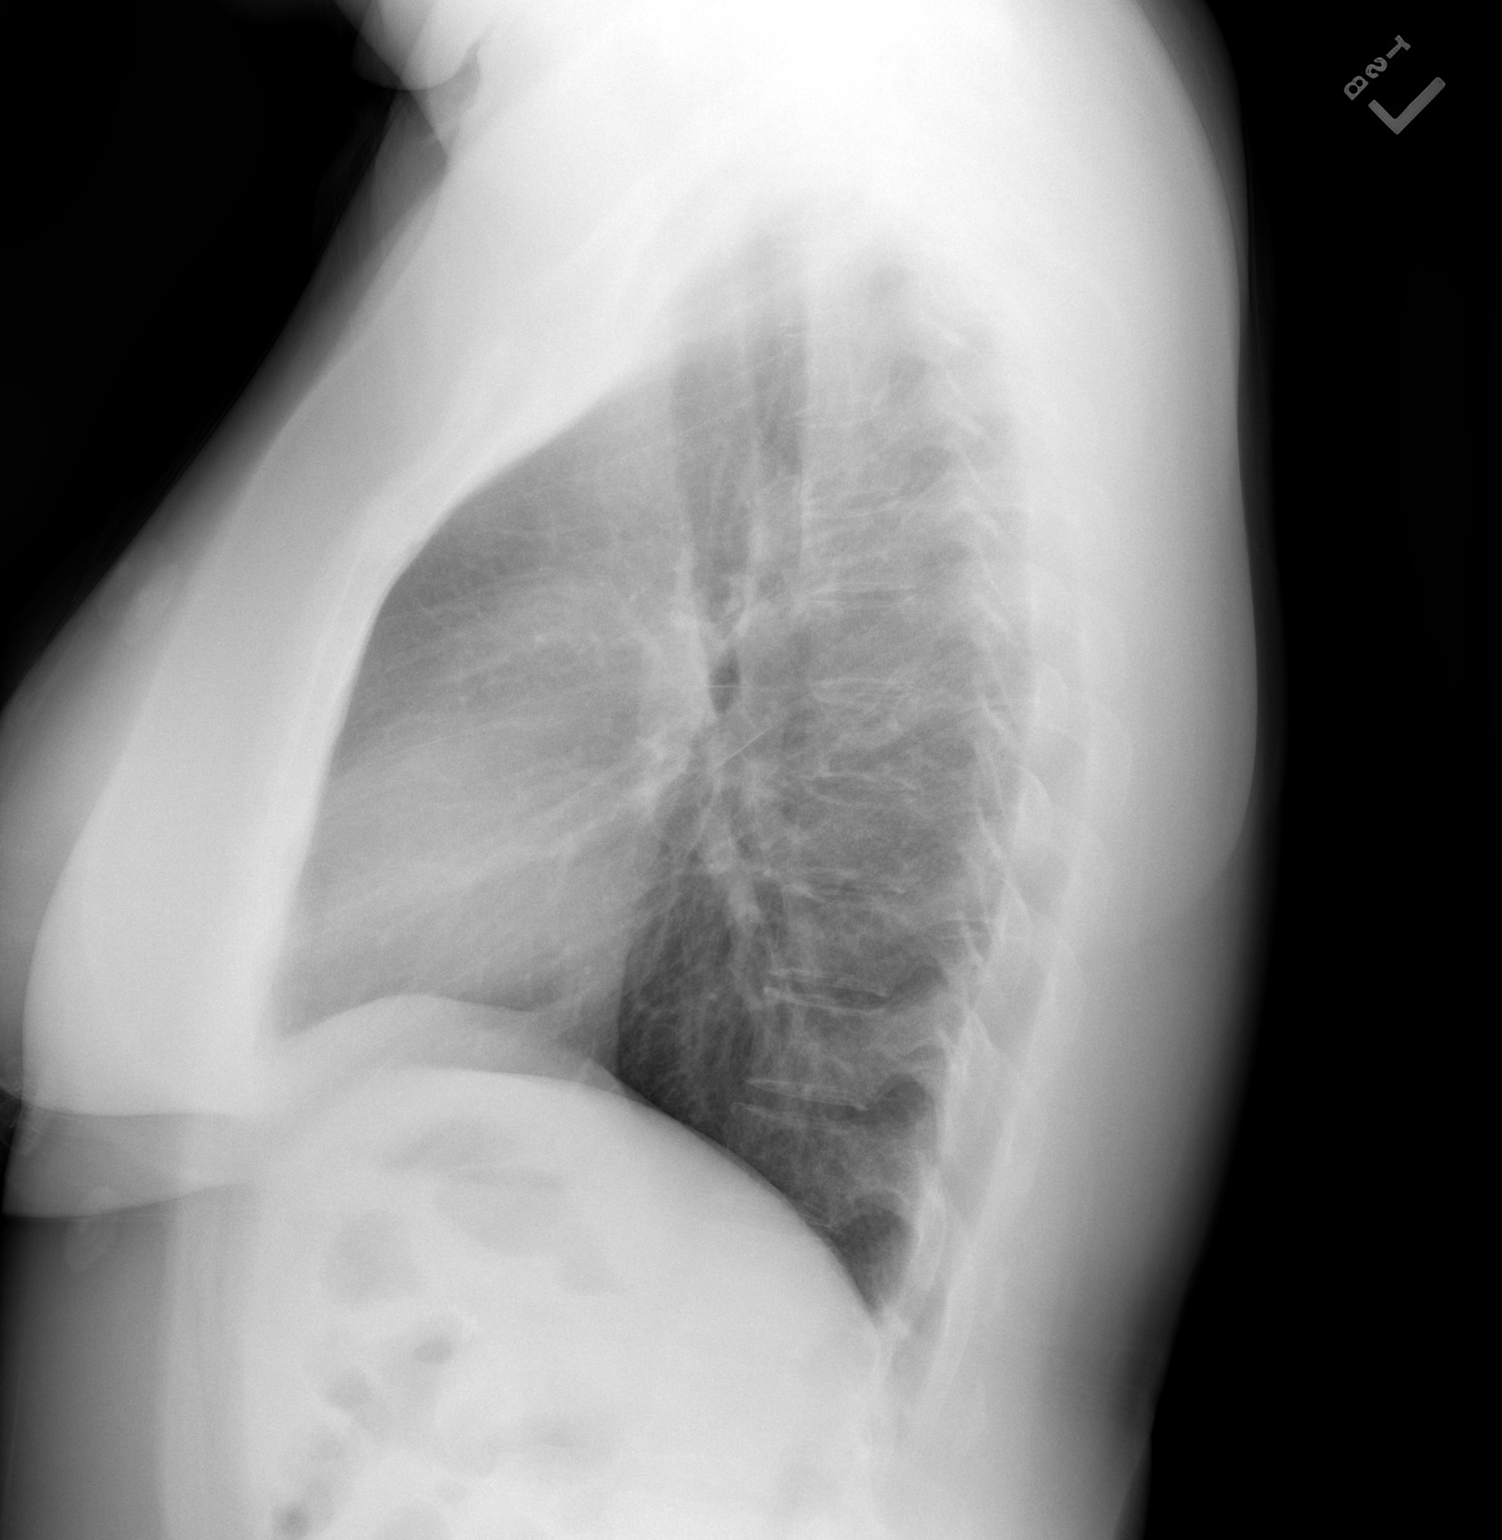

[2 of 2 positions shown; findings below may reference images not displayed]

FINDINGS: The heart size and mediastinal contours are within normal limits.
Both lungs are clear. The visualized skeletal structures are
unremarkable.
IMPRESSION: No active cardiopulmonary disease.

## 2019-09-27 ENCOUNTER — Other Ambulatory Visit: Payer: Self-pay | Admitting: Nurse Practitioner

## 2019-09-27 MED FILL — XARELTO 20 MG TABLET: 20 | 30 days supply | Qty: 30 | Fill #0

## 2019-09-27 MED FILL — ATORVASTATIN CALCIUM 40 MG: 40 | 30 days supply | Qty: 30 | Fill #0

## 2019-09-27 MED FILL — traZODone HCL 50 MG TABS: 50 | 30 days supply | Qty: 30 | Fill #1

## 2019-09-28 ENCOUNTER — Other Ambulatory Visit: Payer: Self-pay | Admitting: Nurse Practitioner

## 2019-09-28 MED FILL — HUMALOG 100 UNITS/ML KWIKPE: 100 | 37 days supply | Qty: 9 | Fill #0

## 2019-10-02 MED FILL — HumaLOG 100 UNIT/ML SOLN: 100 | 28 days supply | Qty: 10 | Fill #2

## 2019-10-02 MED FILL — LANTUS 100 UNITS/ML VIAL: 100 | 28 days supply | Qty: 10 | Fill #2

## 2019-10-06 ENCOUNTER — Other Ambulatory Visit: Payer: Self-pay | Admitting: Nurse Practitioner

## 2019-10-06 MED FILL — ESCITALOPRAM 20 MG TABLET: 20 | 90 days supply | Qty: 90 | Fill #0

## 2019-10-06 NOTE — Telephone Encounter (Signed)
Please review  Thank you

## 2019-10-13 MED FILL — ESCITALOPRAM 20 MG TABLET: 20 | 90 days supply | Qty: 90 | Fill #0

## 2019-11-01 ENCOUNTER — Ambulatory Visit: Payer: Self-pay | Admitting: Nurse Practitioner

## 2019-12-04 IMAGING — CT CT ANGIO NECK
1 of 15 series · 5 of 33 positions shown · IV contrast (APPLIED)
Comparison: Head CT 11/17/2014

CLINICAL DATA: Near syncopal episode. Sudden left eye vision loss.
History of traumatic brain injury.

EXAM:
CT ANGIOGRAPHY HEAD AND NECK
TECHNIQUE: Multidetector CT imaging of the head and neck was performed using
the standard protocol during bolus administration of intravenous
contrast. Multiplanar CT image reconstructions and MIPs were
obtained to evaluate the vascular anatomy. Carotid stenosis
measurements (when applicable) are obtained utilizing NASCET
criteria, using the distal internal carotid diameter as the
denominator.
CONTRAST:  100mL Q3ZRUU-XAB IOPAMIDOL ([id number]-XAB) INJECTION 76%

[Series 12: axial thin · axial · 0.42mm/px · z∈[-51,+169]mm · 5 of 332 slices shown]
[im 56/332  soft-tissue]
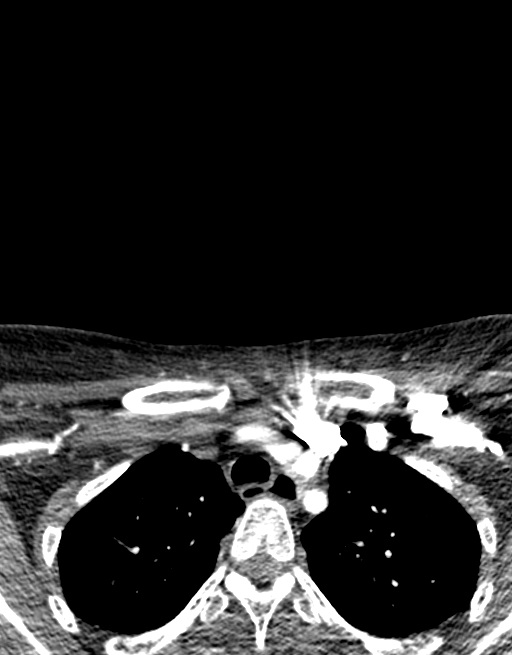
[im 111/332  bone]
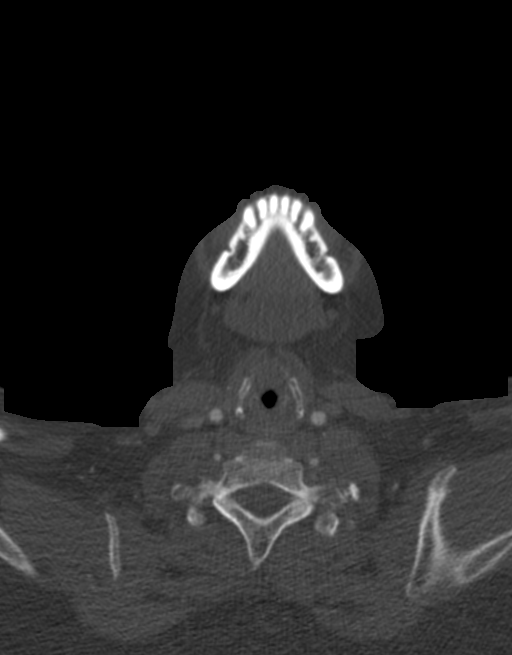
[im 166/332  soft-tissue]
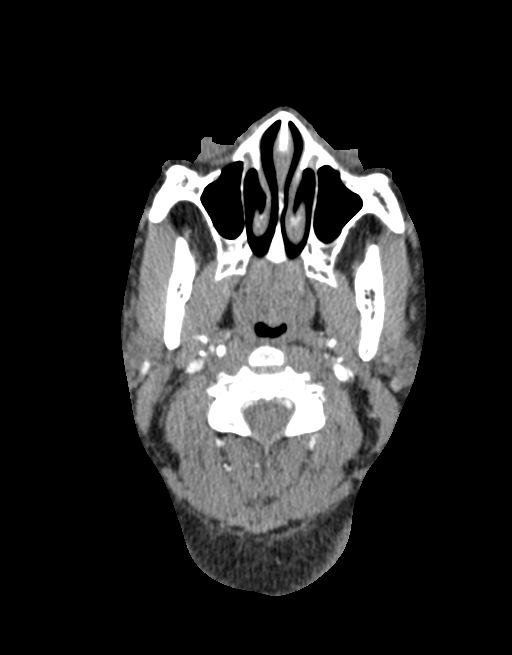
[im 221/332  bone]
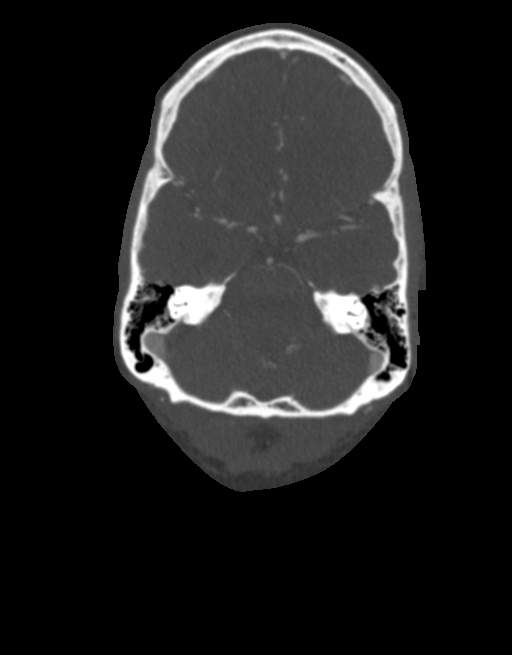
[im 276/332  soft-tissue]
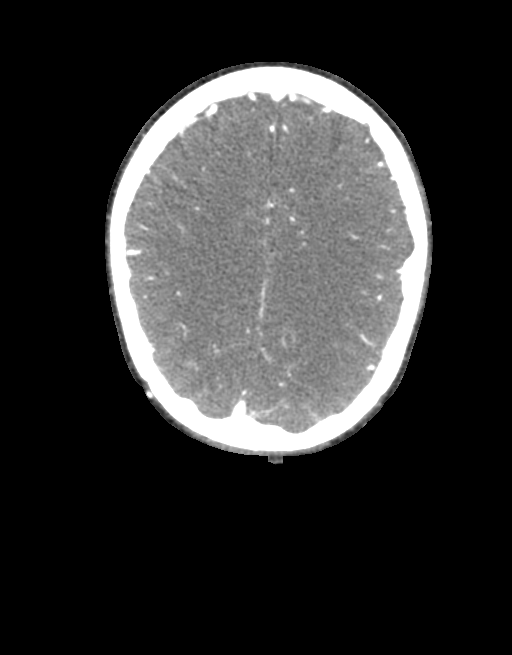

[5 of 33 positions shown; findings below may reference images not displayed]

FINDINGS: CT HEAD FINDINGS

Brain: There is no mass, hemorrhage or extra-axial collection. The
size and configuration of the ventricles and extra-axial CSF spaces
are normal. There is no acute or chronic infarction. The brain
parenchyma is normal.

Skull: The visualized skull base, calvarium and extracranial soft
tissues are normal.

Sinuses/Orbits: No fluid levels or advanced mucosal thickening of
the visualized paranasal sinuses. No mastoid or middle ear effusion.
The orbits are normal.

CTA NECK FINDINGS

SKELETON: There is no bony spinal canal stenosis. No lytic or
blastic lesion.

OTHER NECK: Normal pharynx, larynx and major salivary glands. No
cervical lymphadenopathy. Unremarkable thyroid gland.

UPPER CHEST: No pneumothorax or pleural effusion. No nodules or
masses.

AORTIC ARCH: There is no calcific atherosclerosis of the aortic
arch. There is no aneurysm, dissection or hemodynamically
significant stenosis of the visualized ascending aorta and aortic
arch. Conventional 3 vessel aortic branching pattern. The visualized
proximal subclavian arteries are widely patent.

RIGHT CAROTID SYSTEM:

--Common carotid artery: Widely patent origin without common carotid
artery dissection or aneurysm.

--Internal carotid artery: No dissection, occlusion or aneurysm.
Mild atherosclerotic calcification at the carotid bifurcation
without hemodynamically significant stenosis.

--External carotid artery: No acute abnormality.

LEFT CAROTID SYSTEM:

--Common carotid artery: Widely patent origin without common carotid
artery dissection or aneurysm.

--Internal carotid artery: No dissection, occlusion or aneurysm.
Mild atherosclerotic calcification at the carotid bifurcation
without hemodynamically significant stenosis.

--External carotid artery: No acute abnormality.

VERTEBRAL ARTERIES: Codominant configuration. Both origins are
normal. No dissection, occlusion or flow-limiting stenosis to the
vertebrobasilar confluence.

CTA HEAD FINDINGS

POSTERIOR CIRCULATION:

--Basilar artery: Normal.

--Posterior cerebral arteries: Normal. Both originate from the
basilar artery.

--Superior cerebellar arteries: Normal.

--Inferior cerebellar arteries: Normal anterior and posterior
inferior cerebellar arteries.

ANTERIOR CIRCULATION:

--Intracranial internal carotid arteries: Normal.

--Anterior cerebral arteries: Normal. Both A1 segments are present.
Patent anterior communicating artery.

--Middle cerebral arteries: Normal.

--Posterior communicating arteries: Absent bilaterally.

VENOUS SINUSES: As permitted by contrast timing, patent.

ANATOMIC VARIANTS: None

DELAYED PHASE: No parenchymal contrast enhancement.

Review of the MIP images confirms the above findings.
IMPRESSION: Normal CTA of the head and neck.

## 2019-12-05 IMAGING — US US ABDOMEN LIMITED
1 series · 14 of 25 positions shown · non-contrast
Comparison: CT scan of the abdomen and pelvis dated 01/10/2017

CLINICAL DATA: Elevated total bilirubin.  Cholecystectomy.

EXAM:
ULTRASOUND ABDOMEN LIMITED RIGHT UPPER QUADRANT

[Series 1: us abdomen limited · 14 of 29 slices shown]
[im 1/29]
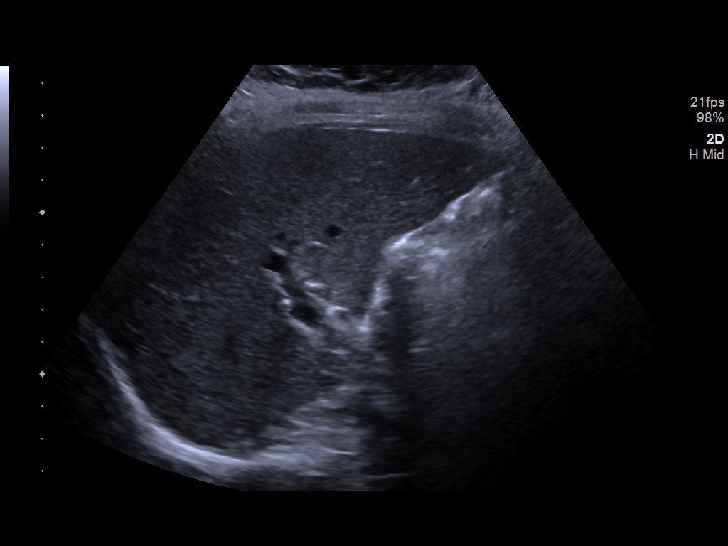
[im 3/29]
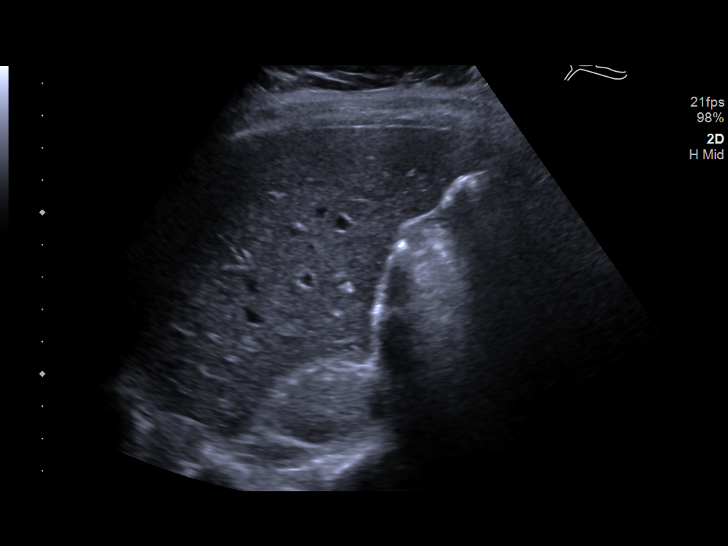
[im 5/29]
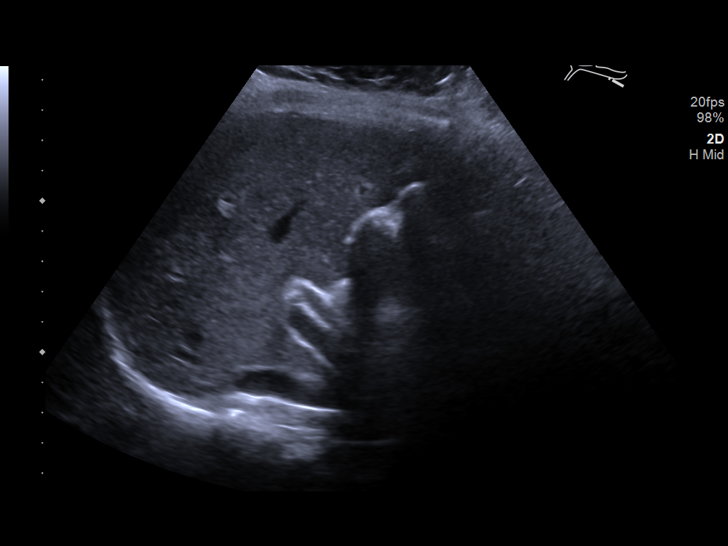
[im 8/29]
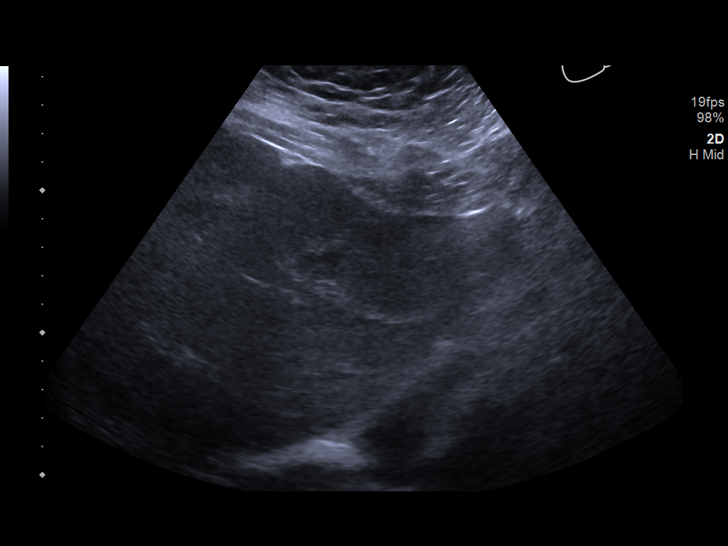
[im 10/29]
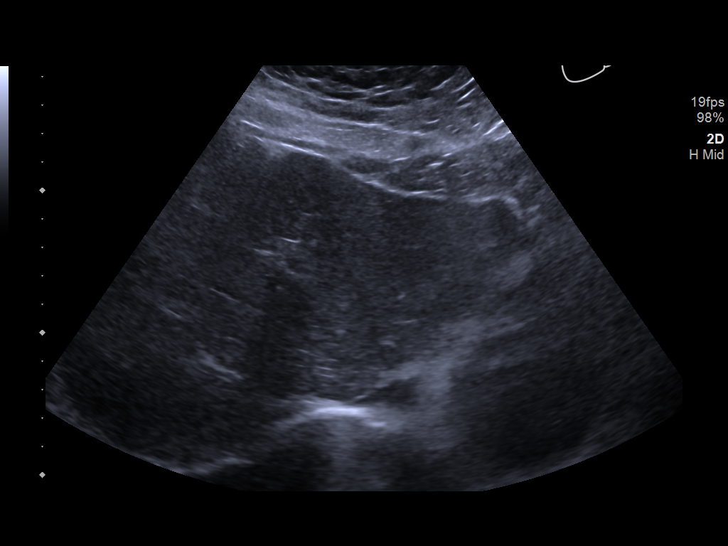
[im 11/29]
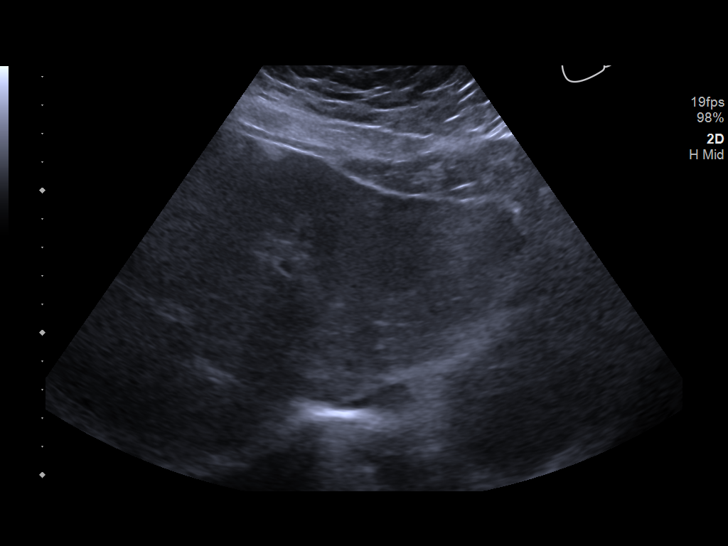
[im 13/29]
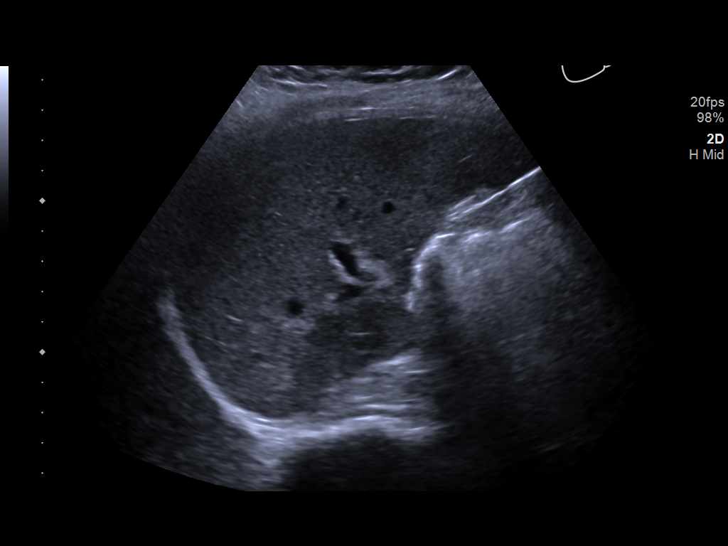
[im 16/29]
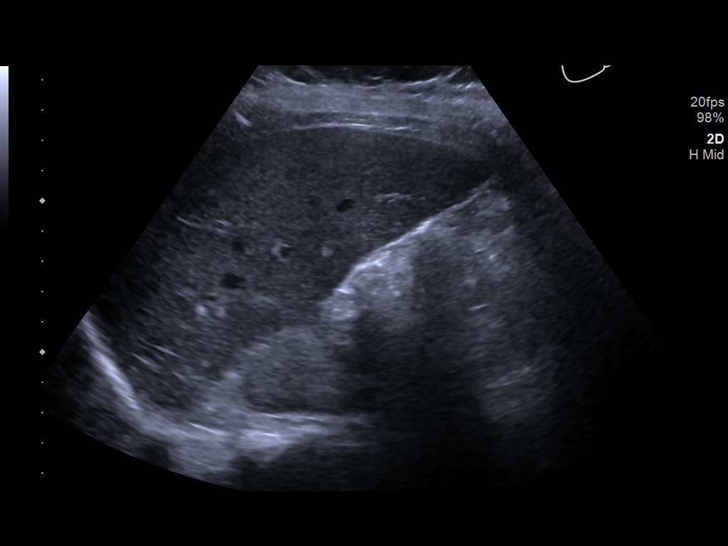
[im 18/29]
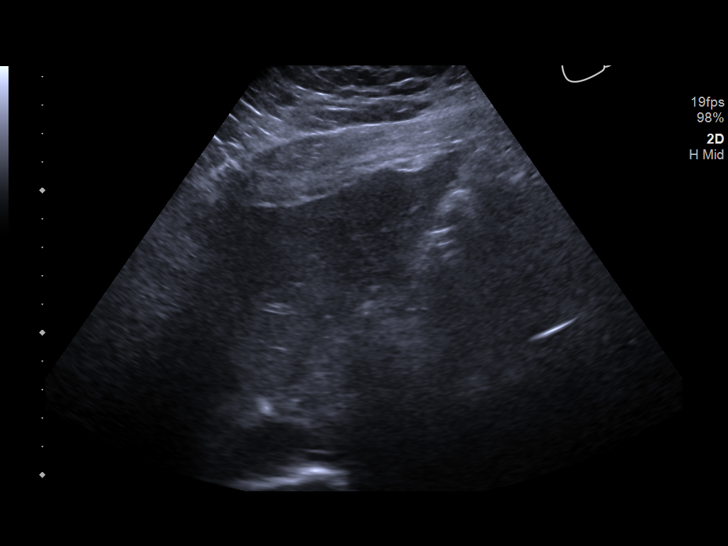
[im 19/29]
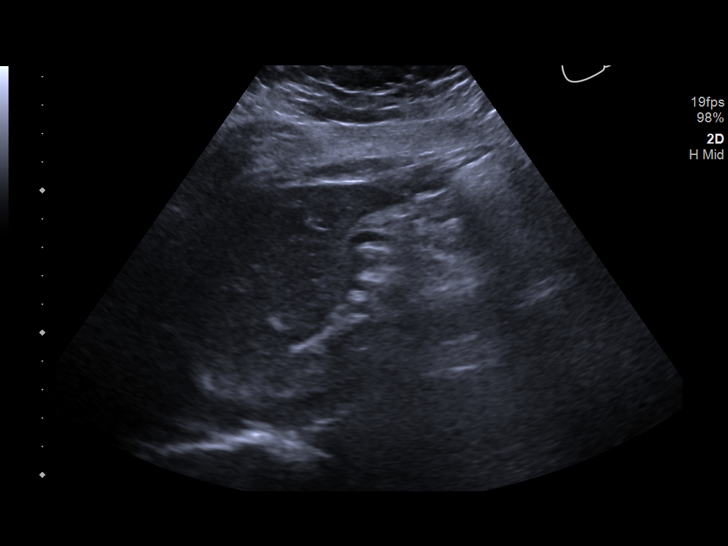
[im 22/29]
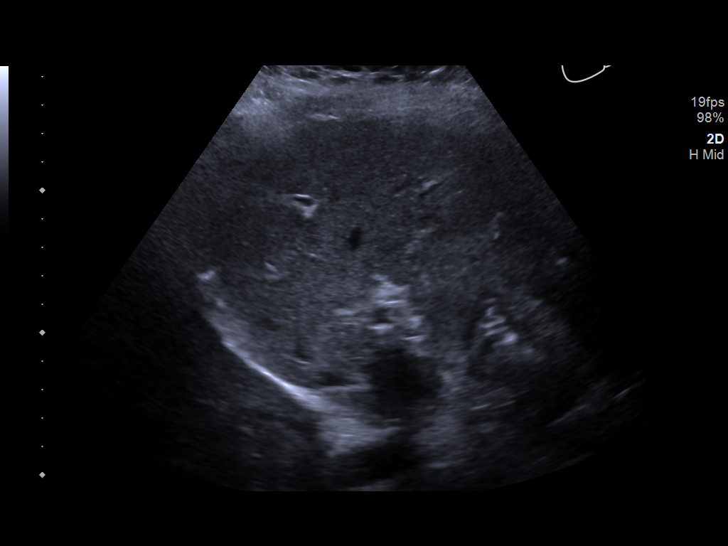
[im 24/29]
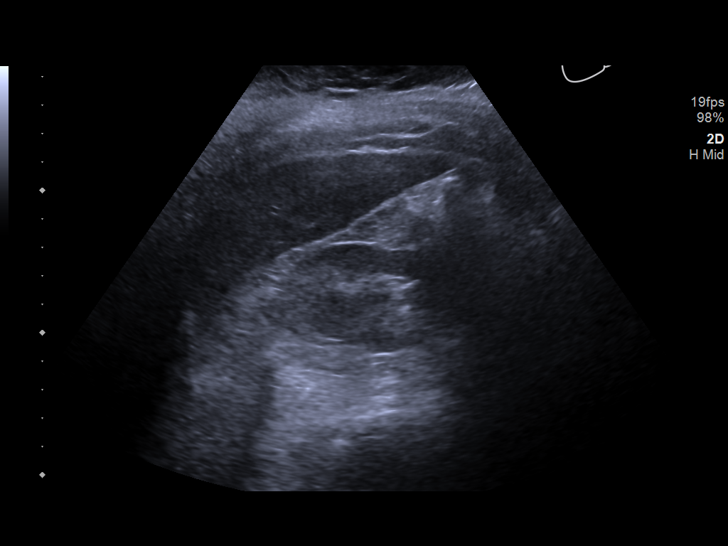
[im 26/29]
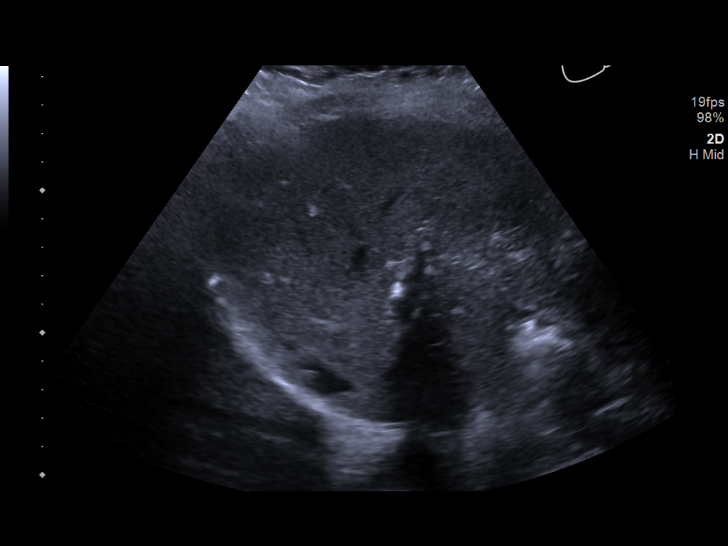
[im 29/29]
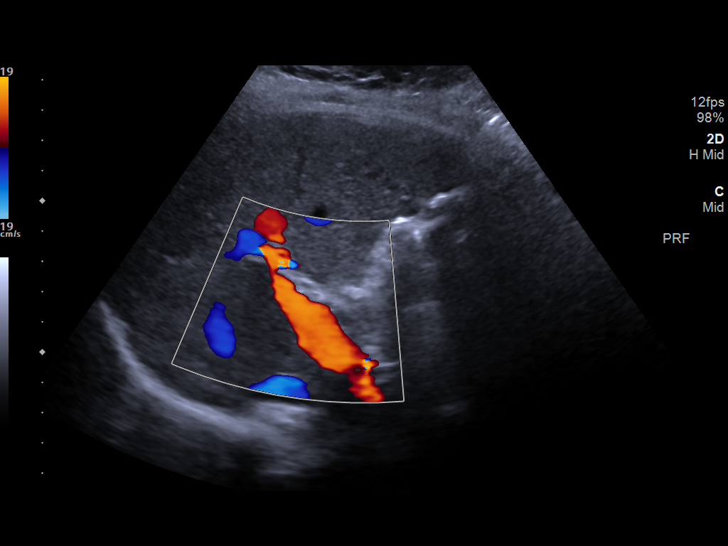

[14 of 25 positions shown; findings below may reference images not displayed]

FINDINGS: Gallbladder:

Removed.

Common bile duct:

Diameter: 4.8 mm, normal.

Liver:

No focal lesion identified. Within normal limits in parenchymal
echogenicity. Portal vein is patent on color Doppler imaging with
normal direction of blood flow towards the liver.
IMPRESSION: Normal exam. The gallbladder has been removed. No dilated bile
ducts.

## 2019-12-11 ENCOUNTER — Other Ambulatory Visit: Payer: Self-pay | Admitting: Nurse Practitioner

## 2019-12-11 NOTE — Telephone Encounter (Signed)
Please see patient request.

## 2020-01-11 ENCOUNTER — Other Ambulatory Visit: Payer: Self-pay | Admitting: Nurse Practitioner

## 2020-02-08 MED FILL — LANTUS 100 UNITS/ML VIAL: 100 | 28 days supply | Qty: 10 | Fill #3

## 2020-02-08 MED FILL — ESCITALOPRAM 20 MG TABLET: 20 | 90 days supply | Qty: 90 | Fill #1

## 2020-02-08 MED FILL — HUMALOG 100 UNITS/ML KWIKPE: 100 | 37 days supply | Qty: 9 | Fill #0

## 2020-02-16 ENCOUNTER — Encounter (HOSPITAL_BASED_OUTPATIENT_CLINIC_OR_DEPARTMENT_OTHER): Payer: Self-pay | Admitting: Emergency Medicine

## 2020-02-16 ENCOUNTER — Other Ambulatory Visit: Payer: Self-pay | Admitting: Nurse Practitioner

## 2020-02-16 ENCOUNTER — Emergency Department (HOSPITAL_BASED_OUTPATIENT_CLINIC_OR_DEPARTMENT_OTHER): Payer: Medicare Other

## 2020-02-16 ENCOUNTER — Other Ambulatory Visit: Payer: Self-pay

## 2020-02-16 ENCOUNTER — Emergency Department (HOSPITAL_BASED_OUTPATIENT_CLINIC_OR_DEPARTMENT_OTHER)
Admission: EM | Admit: 2020-02-16 | Discharge: 2020-02-16 | Disposition: A | Discharge status: Home / Self Care | Payer: Medicare Other | Attending: Emergency Medicine | Admitting: Emergency Medicine

## 2020-02-16 DIAGNOSIS — R059 Cough, unspecified: Secondary | ICD-10-CM | POA: Diagnosis present

## 2020-02-16 DIAGNOSIS — Z20822 Contact with and (suspected) exposure to covid-19: Secondary | ICD-10-CM | POA: Diagnosis not present

## 2020-02-16 DIAGNOSIS — J069 Acute upper respiratory infection, unspecified: Secondary | ICD-10-CM | POA: Diagnosis not present

## 2020-02-16 DIAGNOSIS — Z794 Long term (current) use of insulin: Secondary | ICD-10-CM | POA: Diagnosis not present

## 2020-02-16 DIAGNOSIS — R739 Hyperglycemia, unspecified: Secondary | ICD-10-CM

## 2020-02-16 DIAGNOSIS — E1140 Type 2 diabetes mellitus with diabetic neuropathy, unspecified: Secondary | ICD-10-CM | POA: Diagnosis not present

## 2020-02-16 DIAGNOSIS — Z7901 Long term (current) use of anticoagulants: Secondary | ICD-10-CM | POA: Diagnosis not present

## 2020-02-16 LAB — CBC WITH DIFFERENTIAL/PLATELET
Abs Immature Granulocytes: 0.03 10*3/uL (ref 0.00–0.07)
Basophils Absolute: 0.1 10*3/uL (ref 0.0–0.1)
Basophils Relative: 1 %
Eosinophils Absolute: 0.1 10*3/uL (ref 0.0–0.5)
Eosinophils Relative: 1 %
HCT: 41.3 % (ref 36.0–46.0)
Hemoglobin: 14 g/dL (ref 12.0–15.0)
Immature Granulocytes: 0 %
Lymphocytes Relative: 20 %
Lymphs Abs: 2.3 10*3/uL (ref 0.7–4.0)
MCH: 31.5 pg (ref 26.0–34.0)
MCHC: 33.9 g/dL (ref 30.0–36.0)
MCV: 92.8 fL (ref 80.0–100.0)
Monocytes Absolute: 0.9 10*3/uL (ref 0.1–1.0)
Monocytes Relative: 7 %
Neutro Abs: 8.2 10*3/uL — ABNORMAL HIGH (ref 1.7–7.7)
Neutrophils Relative %: 71 %
Platelets: 398 10*3/uL (ref 150–400)
RBC: 4.45 MIL/uL (ref 3.87–5.11)
RDW: 12.5 % (ref 11.5–15.5)
WBC: 11.5 10*3/uL — ABNORMAL HIGH (ref 4.0–10.5)
nRBC: 0 % (ref 0.0–0.2)

## 2020-02-16 LAB — SARS CORONAVIRUS 2 (TAT 6-24 HRS): SARS Coronavirus 2: NEGATIVE

## 2020-02-16 LAB — RAPID INFLUENZA A&B ANTIGENS
Influenza A (ARMC): NEGATIVE
Influenza B (ARMC): NEGATIVE

## 2020-02-16 LAB — COMPREHENSIVE METABOLIC PANEL
ALT: 31 U/L (ref 0–44)
AST: 43 U/L — ABNORMAL HIGH (ref 15–41)
Albumin: 3.5 g/dL (ref 3.5–5.0)
Alkaline Phosphatase: 110 U/L (ref 38–126)
Anion gap: 13 (ref 5–15)
BUN: 10 mg/dL (ref 6–20)
CO2: 25 mmol/L (ref 22–32)
Calcium: 9.1 mg/dL (ref 8.9–10.3)
Chloride: 95 mmol/L — ABNORMAL LOW (ref 98–111)
Creatinine, Ser: 0.97 mg/dL (ref 0.44–1.00)
GFR, Estimated: >60 mL/min (ref >60)
Glucose, Bld: 347 mg/dL — ABNORMAL HIGH (ref 70–99)
Potassium: 3.6 mmol/L (ref 3.5–5.1)
Sodium: 133 mmol/L — ABNORMAL LOW (ref 135–145)
Total Bilirubin: 0.4 mg/dL (ref 0.3–1.2)
Total Protein: 7.5 g/dL (ref 6.5–8.1)

## 2020-02-16 LAB — CBG MONITORING, ED: Glucose-Capillary: 348 mg/dL — ABNORMAL HIGH (ref 70–99)

## 2020-02-16 LAB — LIPASE, BLOOD: Lipase: 35 U/L (ref 11–51)

## 2020-02-16 MED ORDER — ONDANSETRON HCL 4 MG PO TABS: 4.0000 mg | ORAL_TABLET | Freq: Four times a day (QID) | ORAL | 0 refills | Status: AC | PRN

## 2020-02-16 MED ORDER — SODIUM CHLORIDE 0.9 % IV BOLUS
1000.0000 mL | Freq: Once | INTRAVENOUS | Status: AC
  Administered 2020-02-16: 1000 mL via INTRAVENOUS

## 2020-02-16 MED ORDER — BENZONATATE 200 MG PO CAPS: 200.0000 mg | ORAL_CAPSULE | Freq: Three times a day (TID) | ORAL | 0 refills | Status: AC | PRN

## 2020-02-16 MED ORDER — INSULIN ASPART 100 UNIT/ML ~~LOC~~ SOLN
10.0000 [IU] | Freq: Once | SUBCUTANEOUS | Status: AC
  Administered 2020-02-16: 10 [IU] via SUBCUTANEOUS
  Filled 2020-02-16: qty 10

## 2020-02-16 MED ORDER — ONDANSETRON HCL 4 MG/2ML IJ SOLN
4.0000 mg | Freq: Once | INTRAMUSCULAR | Status: AC
  Administered 2020-02-16: 4 mg via INTRAVENOUS
  Filled 2020-02-16: qty 2

## 2020-02-16 NOTE — ED Provider Notes (Signed)
[facility] EMERGENCY DEPARTMENT Provider Note   CSN: [id number] Arrival date & time: 02/16/20  0404     History Chief Complaint  Patient presents with  . Shortness of Breath    Maureen [name] is a 50 y.o. female.  Patient presents to the emergency department for evaluation of cough, congestion, headache, nausea, vomiting.  Symptoms started 3 days ago.  Cough is nonproductive.  Chest is sore from coughing.  No persistent chest pain.  Patient has been fully vaccinated with booster.  She says she feels like she did when she had the flu in the past.        Past Medical History:  Diagnosis Date  . Abdominal pain   . Achilles tendon contracture, left    with Lisfranc fracture  . Anxiety   . Arthritis   . Biliary dyskinesia   . Cervical strain   . Closed head injury 2010  . Depression   . DVT (deep venous thrombosis) (HCC)   . Elevated LFTs   . Fibromyalgia   . GERD (gastroesophageal reflux disease)   . History of kidney stones   . Hyperlipidemia   . Hypoglycemia   . Joint pain   . Major depressive disorder   . Migraine    hx of none recent  . Nausea & vomiting   . OSA on CPAP 05/14/2016   Moderate to severe OSA with an AHI of 28/hr now on CPAP at 19cm H2O  . Pneumonia   . Post traumatic stress disorder (PTSD)   . Pulmonary embolism (HCC)   . Renal stones   . Skin rash    due to medications  . Sleep trouble   . Stroke [facility])    " series of mini strokes around 2007"  . TIA (transient ischemic attack)   . Wears glasses   . 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 Acute metabolic encephalopathy 05/02/2019  . Chronic anticoagulation 05/02/2019  . AMS (altered mental status) 05/01/2019  . Vision loss 02/02/2018  . Acute loss of vision 02/01/2018  . Leukocytosis 02/01/2018  . Hyperglycemia 02/01/2018  . Hyperbilirubinemia 02/01/2018  . VTE  (venous thromboembolism) 02/01/2018  . Anxiety and depression 02/01/2018  . Orthostatic hypotension 02/01/2018  . Uncontrolled type 1 diabetes mellitus with hyperglycemia, with long-term current use of insulin (HCC) 02/01/2018  . OSA (obstructive sleep apnea) 05/14/2016  . Obesity (BMI 30-39.9) 05/14/2016  . Acute deep vein thrombosis (DVT) of femoral vein of left lower extremity (HCC) 02/10/2016  . Diabetic polyneuropathy associated with type 2 diabetes mellitus (HCC) 02/10/2016  . Lisfranc dislocation, left, sequela 12/16/2015  . Short Achilles tendon (acquired), left ankle 12/16/2015  . Chest pain with moderate risk for cardiac etiology 11/29/2015  . Elevated troponin 11/19/2015  . Migraine headache 11/19/2015  . Neck pain 11/19/2015  . Acute kidney injury (HCC) 02/19/2015  . DKA (diabetic ketoacidoses) 10/24/2014  . HLD (hyperlipidemia) 10/24/2014  . History of pulmonary embolus (PE) 09/28/2014  . Abnormal LFTs 09/03/2014  . Dizziness 09/03/2014  . Acute pulmonary embolism (HCC) 09/03/2014  . Transaminitis 09/02/2014  . GAD (generalized anxiety disorder) 11/23/2013  . PTSD (post-traumatic stress disorder) 11/23/2013  . Severe major depression without psychotic features (HCC) 10/10/2013  . Endometriosis 12/08/2011  . Arthritis of left knee 12/08/2011  . Diabetic retinopathy (HCC) 12/08/2011  . Chronic pain 10/23/2011  . DM  type 1 (diabetes mellitus, type 1) ([place]) 10/13/2011    Past Surgical History:  Procedure Laterality Date  . ABDOMINAL HYSTERECTOMY  2001  . APPENDECTOMY    . [place]  . CHOLECYSTECTOMY  02/10/2011   Procedure: LAPAROSCOPIC CHOLECYSTECTOMY WITH INTRAOPERATIVE CHOLANGIOGRAM;  Surgeon: Judieth Keens, DO;  Location: [facility];  Service: General;  Laterality: N/A;  . COLONOSCOPY    . DILATION AND CURETTAGE OF UTERUS    . exploratory laparomty  Mar 04, 2012  . EYE SURGERY  2006   laser eye surgery left eye  . KNEE SURGERY Left 2012  .  LAPAROSCOPIC APPENDECTOMY N/A 10/12/2012   Procedure: DIAGNOSTIC APPENDECTOMY LAPAROSCOPIC;  Surgeon: Madilyn Hook, DO;  Location: [facility];  Service: General;  Laterality: N/A;  . lipoma removed  yrs ago  . LIVER BIOPSY    . ORIF ANKLE FRACTURE Left 01/03/2016   Procedure: OPEN REDUCTION INTERNAL FIXATION (ORIF) LEFT LISFRANC JOINT, GASTROC RECESSION;  Surgeon: Newt Minion, MD;  Location: [place];  Service: Orthopedics;  Laterality: Left;  . ROBOTIC ASSISTED LAPAROSCOPIC LYSIS OF ADHESION N/A 03/04/2012   Procedure: ROBOTIC ASSISTED LAPAROSCOPIC LYSIS OF EXTENSIVE ADHESIONS;  Surgeon: Claiborne Billings A. Pamala Hurry, MD;  Location: [facility];  Service: Gynecology;  Laterality: N/A;  . TONSILLECTOMY  2001     OB History   No obstetric history on file.     Family History  Problem Relation Age of Onset  . Cancer Father        lymphoma  . Nephrolithiasis Father   . Vascular Disease Father   . Alcohol abuse Father   . Drug abuse Father   . Cancer Maternal Grandmother        colon  . Hypertension Mother   . Heart disease Mother   . Cataracts Mother   . Depression Mother   . Diabetes Brother   . Drug abuse Brother   . Mental illness Maternal Grandfather   . Cancer Maternal Grandfather   . Heart disease Paternal Grandmother   . Heart disease Paternal Grandfather   . Suicidality Maternal Uncle     Social History   Tobacco Use  . Smoking status: Never Smoker  . Smokeless tobacco: Never Used  Vaping Use  . Vaping Use: Never used  Substance Use Topics  . Alcohol use: Yes    Comment: occ  . Drug use: No    Home Medications Prior to Admission medications   Medication Sig Start Date End Date Taking? Authorizing Provider  acetaminophen (TYLENOL) 500 MG tablet Take 1,000 mg by mouth every 6 (six) hours as needed for mild pain.    [provider]  atorvastatin (LIPITOR) 40 MG tablet TAKE 1 TABLET BY MOUTH EVERY DAY 01/11/20   Vevelyn Francois, NP  blood glucose meter kit and supplies  Dispense based on patient and insurance preference. Use up to four times daily as directed. (FOR ICD-10 E10.9, E11.9). 05/03/19   Patrecia Pour, MD  diphenhydrAMINE (DIPHENHIST) 25 MG tablet Take 25 mg by mouth every 6 (six) hours as needed for allergies.    [provider]  escitalopram (LEXAPRO) 20 MG tablet TAKE 1 TABLET (20 MG TOTAL) BY MOUTH AT BEDTIME. 10/06/19 01/04/20  Vevelyn Francois, NP  gabapentin (NEURONTIN) 300 MG capsule Take 600 mg by mouth 3 (three) times daily.  04/03/17   [provider]  HUMALOG KWIKPEN 100 UNIT/ML KwikPen INJECT 8 UNITS UNDER THE SKIN THREE TIMES DAILY 09/28/19   Edison Pace,  Crystal M, NP  insulin glargine (LANTUS) 100 UNIT/ML injection Inject 0.32 mLs (32 Units total) into the skin daily. 05/31/19   Vevelyn Francois, NP  oxyCODONE (OXY IR/ROXICODONE) 5 MG immediate release tablet Take 5-10 mg by mouth every 4 (four) hours as needed for pain. 04/17/19   [provider]  rivaroxaban (XARELTO) 20 MG TABS tablet Take 1 tablet (20 mg total) by mouth daily with supper. 05/31/19   Vevelyn Francois, NP  traZODone (DESYREL) 50 MG tablet TAKE 0.5-1 TABLETS (25-50 MG TOTAL) BY MOUTH AT BEDTIME AS NEEDED FOR SLEEP. 12/13/19   Vevelyn Francois, NP    Allergies    Cephalexin, Ace inhibitors, Ibuprofen, Meloxicam, and Reglan [metoclopramide]  Review of Systems   Review of Systems  HENT: Positive for congestion.   Respiratory: Positive for cough and shortness of breath.   Gastrointestinal: Positive for nausea and vomiting.  Neurological: Positive for headaches.  All other systems reviewed and are negative.   Physical Exam Updated Vital Signs BP (!) 169/82   Pulse 77   Temp 99 F (37.2 C)   Resp 18   Ht $R'5\' 4"'xG$  (1.626 m)   Wt 74.8 kg   SpO2 99%   BMI 28.32 kg/m   Physical Exam Vitals and nursing note reviewed.  Constitutional:      General: She is not in acute distress.    Appearance: Normal appearance. She is well-developed and well-nourished.   HENT:     Head: Normocephalic and atraumatic.     Right Ear: Hearing normal.     Left Ear: Hearing normal.     Nose: Nose normal.     Mouth/Throat:     Mouth: Oropharynx is clear and moist and mucous membranes are normal.  Eyes:     Extraocular Movements: EOM normal.     Conjunctiva/sclera: Conjunctivae normal.     Pupils: Pupils are equal, round, and reactive to light.  Cardiovascular:     Rate and Rhythm: Regular rhythm.     Heart sounds: S1 normal and S2 normal. No murmur heard. No friction rub. No gallop.   Pulmonary:     Effort: Pulmonary effort is normal. No respiratory distress.     Breath sounds: Normal breath sounds.  Chest:     Chest wall: No tenderness.  Abdominal:     General: Bowel sounds are normal.     Palpations: Abdomen is soft. There is no hepatosplenomegaly.     Tenderness: There is no abdominal tenderness. There is no guarding or rebound. Negative signs include Murphy's sign and McBurney's sign.     Hernia: No hernia is present.  Musculoskeletal:        General: Normal range of motion.     Cervical back: Normal range of motion and neck supple.  Skin:    General: Skin is warm, dry and intact.     Findings: No rash.     Nails: There is no cyanosis.  Neurological:     Mental Status: She is alert and oriented to person, place, and time.     GCS: GCS eye subscore is 4. GCS verbal subscore is 5. GCS motor subscore is 6.     Cranial Nerves: No cranial nerve deficit.     Sensory: No sensory deficit.     Coordination: Coordination normal.     Deep Tendon Reflexes: Strength normal.  Psychiatric:        Mood and Affect: Mood and affect normal.  Speech: Speech normal.        Behavior: Behavior normal.        Thought Content: Thought content normal.     ED Results / Procedures / Treatments   Labs (all labs ordered are listed, but only abnormal results are displayed) Labs Reviewed  CBC WITH DIFFERENTIAL/PLATELET - Abnormal; Notable for the following  components:      Result Value   WBC 11.5 (*)    Neutro Abs 8.2 (*)    All other components within normal limits  COMPREHENSIVE METABOLIC PANEL - Abnormal; Notable for the following components:   Sodium 133 (*)    Chloride 95 (*)    Glucose, Bld 347 (*)    AST 43 (*)    All other components within normal limits  CBG MONITORING, ED - Abnormal; Notable for the following components:   Glucose-Capillary 348 (*)    All other components within normal limits  RAPID INFLUENZA A&B ANTIGENS  SARS CORONAVIRUS 2 (TAT 6-24 HRS)  LIPASE, BLOOD    EKG None  Radiology DG Chest Port 1 View  Result Date: 02/16/2020 CLINICAL DATA:  50 year old female with cough, shortness of breath and headache for 3 days. EXAM: PORTABLE CHEST 1 VIEW COMPARISON:  Portable chest 05/01/2019 and earlier. FINDINGS: Portable AP semi upright view at 0438 hours. Normal lung volumes and mediastinal contours. Visualized tracheal air column is within normal limits. Allowing for portable technique the lungs are clear. No pneumothorax or pleural effusion. No acute osseous abnormality identified. Small cervical ribs greater on the left (normal variant). IMPRESSION: Negative portable chest. Electronically Signed   By: Genevie Ann M.D.   On: 02/16/2020 04:52    Procedures Procedures   Medications Ordered in ED Medications  sodium chloride 0.9 % bolus 1,000 mL (1,000 mLs Intravenous New Bag/Given 02/16/20 0454)  ondansetron (ZOFRAN) injection 4 mg (4 mg Intravenous Given 02/16/20 0453)  insulin aspart (novoLOG) injection 10 Units (10 Units Subcutaneous Given 02/16/20 0453)    ED Course  I have reviewed the triage vital signs and the nursing notes.  Pertinent labs & imaging results that were available during my care of the patient were reviewed by me and considered in my medical decision making (see chart for details).    MDM Rules/Calculators/A&P                          Patient presents to the emergency department with acute  illness that is suspicious for Covid infection.  Patient has had 2 vaccination plus booster previously.  She reports that she feels short of breath but is not hypoxic.  Lungs are clear, no wheezing or clinical signs of pneumonia.  Chest x-ray does not show any acute abnormality.  Patient has a history of type 1 diabetes.  She reports that her glucose has been fluctuating a lot with this illness.  She did have hyperglycemia without evidence of diabetic ketoacidosis at arrival.  This was treated with IV fluids and insulin.  Lab work was otherwise unremarkable.  Influenza negative.  Covid pending.  Final Clinical Impression(s) / ED Diagnoses Final diagnoses:  Hyperglycemia  Upper respiratory tract infection, unspecified type    Rx / DC Orders ED Discharge Orders    None       Yamel Bale, Gwenyth Allegra, MD 02/16/20 0532

## 2020-02-16 NOTE — Discharge Instructions (Addendum)
Check my chart later today for the results of your Covid swab

## 2020-02-16 NOTE — ED Triage Notes (Signed)
Pt states she has been feeling short of breath, had a cough, headache and nauseous   Pt states her symptoms started 3 days ago  Pt states cough is nonproductive

## 2020-02-19 ENCOUNTER — Other Ambulatory Visit: Payer: Self-pay | Admitting: Nurse Practitioner

## 2020-02-19 ENCOUNTER — Ambulatory Visit (INDEPENDENT_AMBULATORY_CARE_PROVIDER_SITE_OTHER): Payer: Medicare Other | Admitting: Nurse Practitioner

## 2020-02-19 ENCOUNTER — Encounter: Payer: Self-pay | Admitting: Nurse Practitioner

## 2020-02-19 ENCOUNTER — Other Ambulatory Visit: Payer: Self-pay

## 2020-02-19 VITALS — BP 139/72 | HR 101 | Temp 97.9°F | Ht 64.0 in | Wt 176.0 lb

## 2020-02-19 DIAGNOSIS — E1029 Type 1 diabetes mellitus with other diabetic kidney complication: Secondary | ICD-10-CM

## 2020-02-19 DIAGNOSIS — J069 Acute upper respiratory infection, unspecified: Secondary | ICD-10-CM

## 2020-02-19 DIAGNOSIS — E1065 Type 1 diabetes mellitus with hyperglycemia: Secondary | ICD-10-CM

## 2020-02-19 DIAGNOSIS — R809 Proteinuria, unspecified: Secondary | ICD-10-CM

## 2020-02-19 LAB — POCT GLYCOSYLATED HEMOGLOBIN (HGB A1C): Hemoglobin A1C: 10.1 % — AB (ref 4.0–5.6)

## 2020-02-19 NOTE — Progress Notes (Signed)
[address] [place], [place]  [address] Phone: [phone]   Fax:  [phone]   Established Patient Office Visit  Subjective:  Patient ID: Maureen [name], female    DOB: 12-07-1970  Age: 50 y.o. MRN: [id number]  CC:  Chief Complaint  Patient presents with  . Follow-up    Follow up ,  when to ed for resp. Infection     HPI Maureen [name] presents for follow up. She  has a past medical history of Abdominal pain, Achilles tendon contracture, left, Anxiety, Arthritis, Biliary dyskinesia, Cervical strain, Closed head injury (2010), Depression, DVT (deep venous thrombosis) ([place]), Elevated LFTs, Fibromyalgia, GERD (gastroesophageal reflux disease), History of kidney stones, Hyperlipidemia, Hypoglycemia, Joint pain, Major depressive disorder, Migraine, Nausea & vomiting, OSA on CPAP (05/14/2016), Pneumonia, Post traumatic stress disorder (PTSD), Pulmonary embolism ([place]), Renal stones, Skin rash, Sleep trouble, Stroke ([place]), TIA (transient ischemic attack), Wears glasses, and 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    She was recently seen in the ER for URI . She was tested negative for COVID-19 and Influenza.  She is following up for her diabetes. Current symptoms include: hyperglycemia and nausea. Symptoms have progressed to a point and plateaued. Patient denies foot ulcerations, increased appetite and weight loss. Evaluation to date has included: fasting blood sugar, fasting lipid panel, hemoglobin A1C and microalbuminuria.  Home sugars: BGs are high in the morning, BGs are high in the evening, BGs are high around dinner, BGs are high at bedtime. She admits that has been worse since her respiratory illness. Current treatment: Continued insulin which has been not very effective and Continued ACE inhibitor/ARB which has been unable to assess effectiveness.   She has chronic pain and does continue to reside in [place]  due to her mothers illness. She is wondering when she is here if she can get her pain medication prescribed.   Past Medical History:  Diagnosis Date  . Abdominal pain   . Achilles tendon contracture, left    with Lisfranc fracture  . Anxiety   . Arthritis   . Biliary dyskinesia   . Cervical strain   . Closed head injury 2010  . Depression   . DVT (deep venous thrombosis) ([place])   . Elevated LFTs   . Fibromyalgia   . GERD (gastroesophageal reflux disease)   . History of kidney stones   . Hyperlipidemia   . Hypoglycemia   . Joint pain   . Major depressive disorder   . Migraine    hx of none recent  . Nausea & vomiting   . OSA on CPAP 05/14/2016   Moderate to severe OSA with an AHI of 28/hr now on CPAP at 19cm H2O  . Pneumonia   . Post traumatic stress disorder (PTSD)   . Pulmonary embolism ([place])   . Renal stones   . Skin rash    due to medications  . Sleep trouble   . Stroke [facility])    " series of mini strokes around 2007"  . TIA (transient ischemic attack)   . Wears glasses   . 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 ABDOMINAL HYSTERECTOMY  2001  . APPENDECTOMY    . [place]  . CHOLECYSTECTOMY  02/10/2011   Procedure: LAPAROSCOPIC CHOLECYSTECTOMY WITH INTRAOPERATIVE CHOLANGIOGRAM;  Surgeon: Aaron Edelman  Billy Fischer, DO;  Location: [facility];  Service: General;  Laterality: N/A;  . COLONOSCOPY    . DILATION AND CURETTAGE OF UTERUS    . exploratory laparomty  Mar 04, 2012  . EYE SURGERY  2006   laser eye surgery left eye  . KNEE SURGERY Left 2012  . LAPAROSCOPIC APPENDECTOMY N/A 10/12/2012   Procedure: DIAGNOSTIC APPENDECTOMY LAPAROSCOPIC;  Surgeon: Madilyn Hook, DO;  Location: [facility];  Service: General;  Laterality: N/A;  . lipoma removed  yrs ago  . LIVER BIOPSY    . ORIF ANKLE FRACTURE Left 01/03/2016   Procedure: OPEN REDUCTION INTERNAL FIXATION (ORIF)  LEFT LISFRANC JOINT, GASTROC RECESSION;  Surgeon: Newt Minion, MD;  Location: [facility];  Service: Orthopedics;  Laterality: Left;  . ROBOTIC ASSISTED LAPAROSCOPIC LYSIS OF ADHESION N/A 03/04/2012   Procedure: ROBOTIC ASSISTED LAPAROSCOPIC LYSIS OF EXTENSIVE ADHESIONS;  Surgeon: Claiborne Billings A. Pamala Hurry, MD;  Location: [facility];  Service: Gynecology;  Laterality: N/A;  . TONSILLECTOMY  2001    Family History  Problem Relation Age of Onset  . Cancer Father        lymphoma  . Nephrolithiasis Father   . Vascular Disease Father   . Alcohol abuse Father   . Drug abuse Father   . Cancer Maternal Grandmother        colon  . Hypertension Mother   . Heart disease Mother   . Cataracts Mother   . Depression Mother   . Diabetes Brother   . Drug abuse Brother   . Mental illness Maternal Grandfather   . Cancer Maternal Grandfather   . Heart disease Paternal Grandmother   . Heart disease Paternal Grandfather   . Suicidality Maternal Uncle     Social History   Socioeconomic History  . Marital status: Divorced    Spouse name: Mayline Dragon  . Number of children: 2  . Years of education: 12+  . Highest education level: Not on file  Occupational History  . Occupation: stay at home mom    Employer: NOT EMPLOYED  Tobacco Use  . Smoking status: Never Smoker  . Smokeless tobacco: Never Used  Vaping Use  . Vaping Use: Never used  Substance and Sexual Activity  . Alcohol use: Yes    Comment: occ  . Drug use: No  . Sexual activity: Not on file  Other Topics Concern  . Not on file  Social History Narrative   Lives independently.  Before going back to school, she was a Research scientist (physical sciences) at [employer].  Stop working when she was diagnosed with endometriosis and had a TIA.   Social Determinants of Health   Financial Resource Strain: Not on file  Food Insecurity: Not on file  Transportation Needs: Not on file  Physical Activity: Not on file  Stress: Not on file  Social Connections: Not  on file  Intimate Partner Violence: Not on file    Outpatient Medications Prior to Visit  Medication Sig Dispense Refill  . acetaminophen (TYLENOL) 500 MG tablet Take 1,000 mg by mouth every 6 (six) hours as needed for mild pain.    Marland Kitchen atorvastatin (LIPITOR) 40 MG tablet TAKE 1 TABLET BY MOUTH EVERY DAY 30 tablet 1  . blood glucose meter kit and supplies Dispense based on patient and insurance preference. Use up to four times daily as directed. (FOR ICD-10 E10.9, E11.9). 1 each 0  . diphenhydrAMINE (BENADRYL) 25 MG tablet Take 25 mg by mouth every 6 (six) hours as  needed for allergies.    Marland Kitchen gabapentin (NEURONTIN) 300 MG capsule Take 600 mg by mouth 3 (three) times daily.     Marland Kitchen HUMALOG KWIKPEN 100 UNIT/ML KwikPen INJECT 8 UNITS UNDER THE SKIN THREE TIMES DAILY 9 mL 1  . insulin glargine (LANTUS) 100 UNIT/ML injection Inject 0.32 mLs (32 Units total) into the skin daily. 10 mL 11  . oxyCODONE (OXY IR/ROXICODONE) 5 MG immediate release tablet Take 5-10 mg by mouth every 4 (four) hours as needed for pain.    . rivaroxaban (XARELTO) 20 MG TABS tablet Take 1 tablet (20 mg total) by mouth daily with supper. 30 tablet 2  . traZODone (DESYREL) 50 MG tablet TAKE 0.5-1 TABLETS (25-50 MG TOTAL) BY MOUTH AT BEDTIME AS NEEDED FOR SLEEP. 30 tablet 3  . benzonatate (TESSALON) 200 MG capsule Take 1 capsule (200 mg total) by mouth 3 (three) times daily as needed for cough. (Patient not taking: Reported on 02/19/2020) 20 capsule 0  . escitalopram (LEXAPRO) 20 MG tablet TAKE 1 TABLET (20 MG TOTAL) BY MOUTH AT BEDTIME. 90 tablet 3  . ondansetron (ZOFRAN) 4 MG tablet Take 1 tablet (4 mg total) by mouth every 6 (six) hours as needed for nausea or vomiting. (Patient not taking: Reported on 02/19/2020) 20 tablet 0   No facility-administered medications prior to visit.    Allergies  Allergen Reactions  . Cephalexin Anaphylaxis, Shortness Of Breath, Rash and Other (See Comments)    Pt was admitted to the hospital upon  taking.   . Ace Inhibitors Cough    Previous rxn yrs ago   . Ibuprofen Nausea And Vomiting and Rash  . Meloxicam Nausea Only  . Reglan [Metoclopramide] Anxiety    ROS Review of Systems    Objective:    Physical Exam Constitutional:      General: She is not in acute distress. HENT:     Head: Normocephalic and atraumatic.     Nose: Nose normal.     Mouth/Throat:     Mouth: Mucous membranes are moist.  Cardiovascular:     Rate and Rhythm: Normal rate and regular rhythm.     Pulses: Normal pulses.     Heart sounds: Normal heart sounds.  Pulmonary:     Effort: Pulmonary effort is normal.     Breath sounds: Normal breath sounds.  Abdominal:     Palpations: Abdomen is soft.  Musculoskeletal:        General: Normal range of motion.     Cervical back: Normal range of motion.  Skin:    General: Skin is warm and dry.     Capillary Refill: Capillary refill takes less than 2 seconds.  Neurological:     General: No focal deficit present.     Mental Status: She is alert and oriented to person, place, and time.  Psychiatric:        Mood and Affect: Mood normal.        Behavior: Behavior normal.        Thought Content: Thought content normal.        Judgment: Judgment normal.     BP 139/72 (BP Location: Left Arm, Patient Position: Sitting, Cuff Size: Normal)   Pulse (!) 101   Temp 97.9 F (36.6 C) (Temporal)   Ht 5\' 4"  (1.626 m)   Wt 176 lb (79.8 kg)   SpO2 98%   BMI 30.21 kg/m  Wt Readings from Last 3 Encounters:  02/19/20 176 lb (79.8 kg)  02/16/20 165  lb (74.8 kg)  07/31/19 180 lb (81.6 kg)     Health Maintenance Due  Topic Date Due  . OPHTHALMOLOGY EXAM  Never done  . PAP SMEAR-Modifier  Never done  . FOOT EXAM  03/05/2017  . URINE MICROALBUMIN  03/05/2017  . COLONOSCOPY (Pts 45-62yrs Insurance coverage will need to be confirmed)  Never done  . INFLUENZA VACCINE  08/20/2019  . COVID-19 Vaccine (3 - Booster for Pfizer series) 11/11/2019    There are no  preventive care reminders to display for this patient.  Lab Results  Component Value Date   Lab Results  Component Value Date   WBC 11.5 (H) 02/16/2020   HGB 14.0 02/16/2020   HCT 41.3 02/16/2020   MCV 92.8 02/16/2020   PLT 398 02/16/2020   Lab Results  Component Value Date   NA 133 (L) 02/16/2020   K 3.6 02/16/2020   CHLORIDE 101 06/30/2016   CO2 25 02/16/2020   GLUCOSE 347 (H) 02/16/2020   BUN 10 02/16/2020   CREATININE 0.97 02/16/2020   BILITOT 0.4 02/16/2020   ALKPHOS 110 02/16/2020   AST 43 (H) 02/16/2020   ALT 31 02/16/2020   PROT 7.5 02/16/2020   ALBUMIN 3.5 02/16/2020   CALCIUM 9.1 02/16/2020   ANIONGAP 13 02/16/2020   EGFR 72 (L) 06/30/2016   GFR 88.87 03/05/2016   Lab Results  Component Value Date   CHOL 171 07/31/2019   Lab Results  Component Value Date   HDL 79 07/31/2019   Lab Results  Component Value Date   LDLCALC 77 07/31/2019   Lab Results  Component Value Date   TRIG 84 07/31/2019   Lab Results  Component Value Date   CHOLHDL 2.2 07/31/2019   Lab Results  Component Value Date   HGBA1C 10.1 (A) 02/19/2020      Assessment & Plan:   Problem List Items Addressed This Visit      Endocrine   Uncontrolled type 1 diabetes mellitus with hyperglycemia, with long-term current use of insulin (HCC) A1c elevated from 9.4% to 10.1%. She is aware of the changes that she needs to make and will make those necessary changes. General information provided. Encouraged regular follow up to get A1c to goal.    DM type 1 (diabetes mellitus, type 1) (HCC) - Primary   Relevant Orders   POCT glycosylated hemoglobin (Hb A1C) (Completed)    Other Visit Diagnoses    Upper respiratory tract infection, unspecified type     Continue to treat based on ED recommendations  Instructed that chronic pain management is not an option in the clinic. Can refer to a pain clinic in the area if desired for ongoing treatment options.     No orders of the defined types  were placed in this encounter.   Follow-up: Return in about 3 months (around 05/18/2020).    Vevelyn Francois, NP

## 2020-02-19 NOTE — Patient Instructions (Signed)
Type 1 Diabetes Mellitus, Self-Care, Adult When you have type 1 diabetes (type 1 diabetes mellitus), you must make sure your blood sugar (glucose) stays in a healthy range. You can do this with:  Insulin.  Healthy foods.  Exercise.  Lifestyle changes.  Other medicines, if needed.  Support from doctors and others. What are the risks? Having diabetes can put you at risk for heart disease and kidney disease. These problems can get worse if you do not take good care of yourself and keep your blood sugar in a healthy range. How to stay aware of blood sugar  Check your blood sugar every day, as often as told.  Have your A1C (hemoglobin A1C) level checked two or more times a year. Have it checked more often if your doctor tells you to.  Try to meet the treatment goals set by your doctor. Try to have these blood sugar levels: ? Before meals (preprandial): 80-130 mg/dL (4.4-7.2 mmol/L). ? After meals (postprandial): below 180 mg/dL (10 mmol/L). ? A1C level: less than 7%.   Follow these instructions at home: Medicines  Take over-the-counter and prescription medicines only as told by your doctor.  Take insulin and medicines every day as told.  Do not run out of insulin or medicines.  Change the amount of insulin you take based on how active you are and what foods you eat. Your doctor will tell you the amount to take. Eating and drinking  Choose healthy foods, such as: ? Low-fat (lean) proteins. ? Complex carbs (carbohydrates), such aswhole wheat. ? Fresh fruits and vegetables. ? Low-fat dairy products. ? Healthy fats.  Meet with a food expert (dietitian) to help you make an eating plan that is right for you. Be sure to: ? Follow instructions from your doctor about what you cannot eat or drink. ? Drink enough fluid to keep your pee (urine) pale yellow. ? Keep track of carbs that you eat. Read food labels and learn food serving sizes. ? Follow your sick-day plan when you cannot  eat or drink normally. Make this plan with your doctor so it is ready.  Have a 15-gram fast-acting carb snack with you at all times to treat low blood sugar.   Activity  Get regular exercise as told.  Spread out your exercise over 3 or more days a week.  Do not go more than 2 days in a row without exercise.  Talk with your doctor before you start a new exercise. Lifestyle  Do not use any products that contain nicotine or tobacco, such as cigarettes, e-cigarettes, and chewing tobacco. If you need help quitting, ask your doctor.  If your doctor says that alcohol is safe for you: ? Limit how much you use to:  0-1 drink a day for women who are not pregnant.  0-2 drinks a day for men. ? Be aware of how much alcohol is in your drink. In the [place] one drink equals one 12 oz bottle of beer (355 mL), one 5 oz glass of wine (148 mL), or one 1 oz glass of hard liquor (44 mL).  Learn to lower stress. If you need help, ask your doctor. Body care  Stay up to date with your shots (vaccinations) as told by your doctor. Get shots for: ? The flu. ? Pneumonia. ? Hepatitis B.  Have your eyes and feet checked by a doctor as often as told.  Check your skin and feet every day. Check for cuts, bruises, redness, blisters, or sores.    Brush your teeth and gums two times a day. Floss one or more times a day.  Go to the dentist one or more times every 6 months.  Stay at a healthy weight.   General instructions  Share your diabetes care plan with people at work, school, and home.  Make sure your family and close friends learn the symptoms of low blood sugar and can help treat you. You will need their help if you cannot treat yourself.  Check your pee for ketones when you are sick and as told.  Carry a card or wear jewelry that says you have diabetes.  Keep all follow-up visits as told by your doctor. This is important. Questions to ask your doctor  Do I need to meet with a diabetes  educator?  Where can I find a support group for people with diabetes?  Should I have a glucagon shot kit at home? Where to find more information  American Diabetes Association (ADA): diabetes.org  Association of Diabetes Care and Education Specialists (ADCES): diabeteseducator.org  International Diabetes Federation (IDF): https://www.munoz-bell.org/ Get help right away if:  Your blood sugar is below 54 mg/dL (3 mmol/L).  You have medium or large levels of ketones in your pee. These symptoms may be an emergency. Do not wait to see if the symptoms will go away. Get medical help right away. Call your local emergency services (911 in the [place] Do not drive yourself to the hospital. Summary  When you have type 1 diabetes, you must make sure your blood sugar (glucose) stays in a healthy range.  Diabetes can raise your risk for heart disease and kidney disease. These problems can get worse if you do not take good care of yourself and keep your blood sugar in a healthy range.  Check your blood sugar every day, as often as told.  Keep all follow-up visits as told by your doctor. This is important. This information is not intended to replace advice given to you by your health care provider. Make sure you discuss any questions you have with your health care provider. Document Revised: 02/23/2019 Document Reviewed: 02/23/2019 Elsevier Patient Education  2021 [employer].

## 2020-03-27 MED FILL — LANTUS 100 UNITS/ML VIAL: 100 | 28 days supply | Qty: 10 | Fill #4

## 2020-03-27 MED FILL — HUMALOG 100 UNITS/ML KWIKPE: 100 | 24 days supply | Qty: 6 | Fill #1

## 2020-04-20 ENCOUNTER — Other Ambulatory Visit: Payer: Self-pay

## 2020-04-22 ENCOUNTER — Other Ambulatory Visit: Payer: Self-pay | Admitting: Nurse Practitioner

## 2020-04-25 ENCOUNTER — Other Ambulatory Visit: Payer: Self-pay

## 2020-04-25 ENCOUNTER — Other Ambulatory Visit: Payer: Self-pay | Admitting: Nurse Practitioner

## 2020-04-25 MED FILL — Insulin Glargine Inj 100 Unit/ML: SUBCUTANEOUS | 28 days supply | Qty: 10 | Fill #0 | Status: CN

## 2020-04-29 ENCOUNTER — Other Ambulatory Visit: Payer: Self-pay

## 2020-04-29 ENCOUNTER — Ambulatory Visit: Payer: Self-pay | Admitting: Nurse Practitioner

## 2020-04-29 MED ORDER — TRAZODONE HCL 50 MG PO TABS
25.0000 mg | ORAL_TABLET | Freq: Every evening | ORAL | 11 refills | Status: AC | PRN
  Filled 2020-04-29: qty 30, 30d supply, fill #0

## 2020-04-30 ENCOUNTER — Other Ambulatory Visit: Payer: Self-pay

## 2020-05-06 ENCOUNTER — Other Ambulatory Visit: Payer: Self-pay

## 2020-05-07 ENCOUNTER — Other Ambulatory Visit: Payer: Self-pay

## 2020-05-30 ENCOUNTER — Ambulatory Visit: Payer: Self-pay | Admitting: Nurse Practitioner

## 2020-06-14 ENCOUNTER — Other Ambulatory Visit: Payer: Self-pay

## 2020-06-14 MED FILL — Escitalopram Oxalate Tab 20 MG (Base Equiv): ORAL | 90 days supply | Qty: 90 | Fill #0 | Status: AC

## 2020-06-19 ENCOUNTER — Ambulatory Visit: Payer: Medicare Other

## 2020-06-19 ENCOUNTER — Ambulatory Visit (HOSPITAL_COMMUNITY)
Admission: EM | Admit: 2020-06-19 | Discharge: 2020-06-19 | Disposition: A | Discharge status: Home / Self Care | Payer: Medicare Other

## 2020-06-19 ENCOUNTER — Other Ambulatory Visit: Payer: Self-pay

## 2020-06-19 DIAGNOSIS — Z20822 Contact with and (suspected) exposure to covid-19: Secondary | ICD-10-CM

## 2020-06-20 LAB — NOVEL CORONAVIRUS, NAA: SARS-CoV-2, NAA: NOT DETECTED

## 2020-06-20 LAB — SARS-COV-2, NAA 2 DAY TAT

## 2020-08-29 ENCOUNTER — Telehealth: Payer: Self-pay

## 2020-08-29 DIAGNOSIS — E782 Mixed hyperlipidemia: Secondary | ICD-10-CM

## 2020-08-29 MED ORDER — ATORVASTATIN CALCIUM 40 MG PO TABS: 40.0000 mg | ORAL_TABLET | Freq: Every day | ORAL | 1 refills | Status: AC

## 2020-08-29 NOTE — Telephone Encounter (Signed)
refilled 

## 2020-08-29 NOTE — Telephone Encounter (Signed)
Atorvastatin

## 2020-10-09 ENCOUNTER — Telehealth: Payer: Self-pay | Admitting: Nurse Practitioner

## 2020-10-09 NOTE — Telephone Encounter (Signed)
Left message for patient to call back and schedule Medicare Annual Wellness Visit    awvi 07/19/09 pre palmetto  please schedule at anytime with health coach

## 2021-03-03 IMAGING — CT CT HEAD W/O CM
3 of 6 series · 15 of 47 positions shown, 18 images · non-contrast
Comparison: 02/01/2018, 11/17/2014

CLINICAL DATA: Hyperglycemia, altered level of consciousness

EXAM:
CT HEAD WITHOUT CONTRAST
TECHNIQUE: Contiguous axial images were obtained from the base of the skull
through the vertex without intravenous contrast.

[Series 2: head wo · axial · 0.40mm/px · z∈[+1300,+1430]mm · 10 of 30 slices shown, 13 images]
[im 2/30  brain]
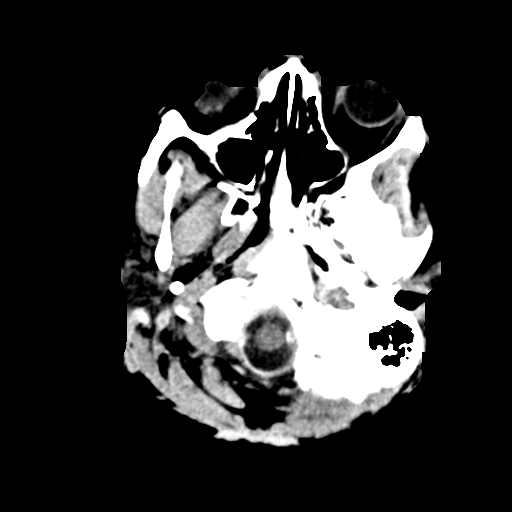
[im 2/30  bone]
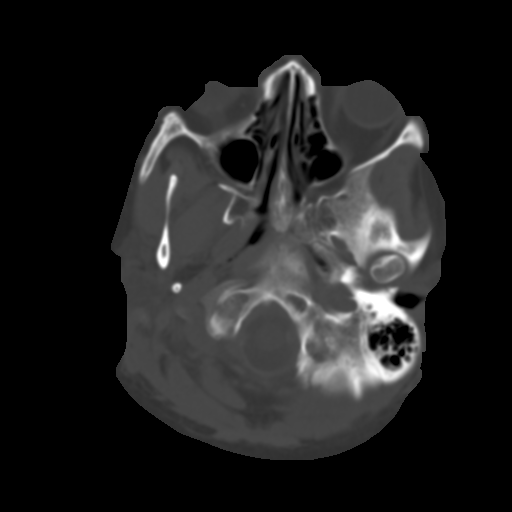
[im 5/30  brain]
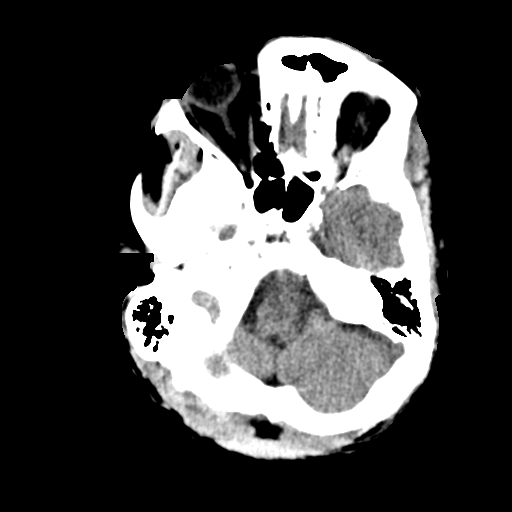
[im 8/30  brain]
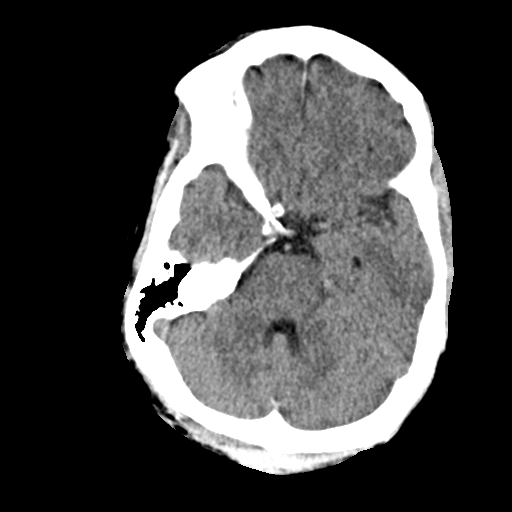
[im 11/30  brain]
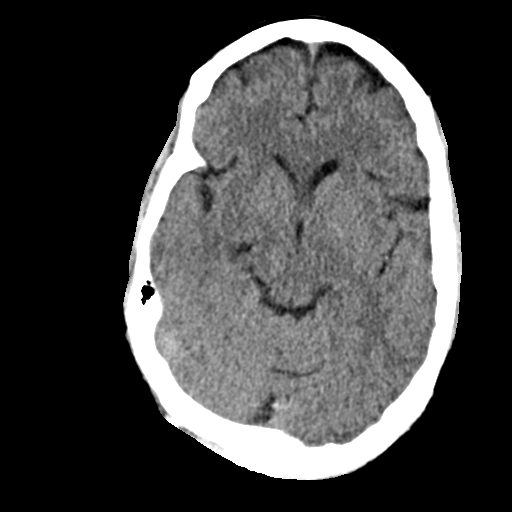
[im 14/30  brain]
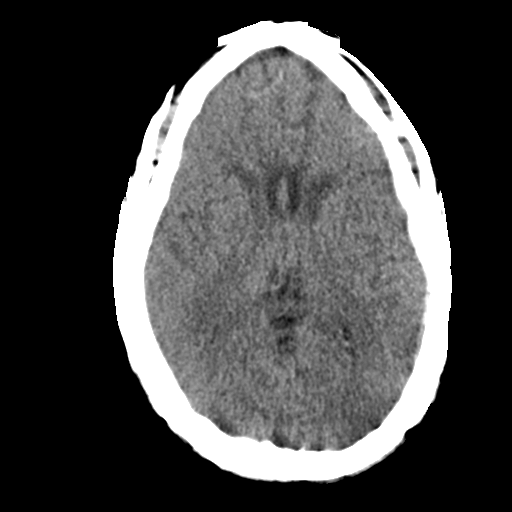
[im 14/30  bone]
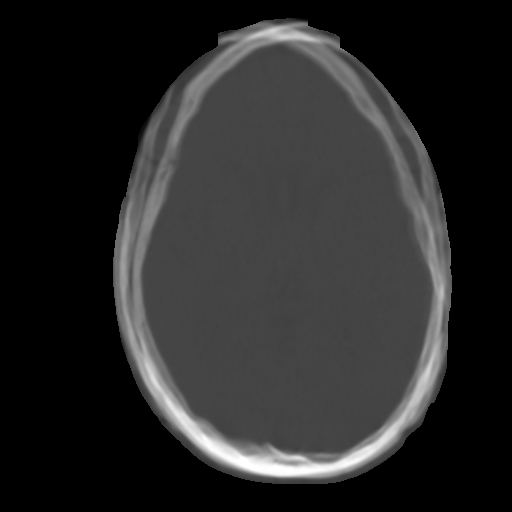
[im 16/30  brain]
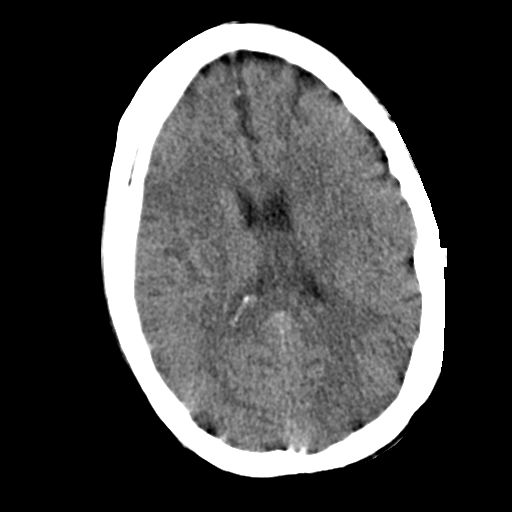
[im 19/30  brain]
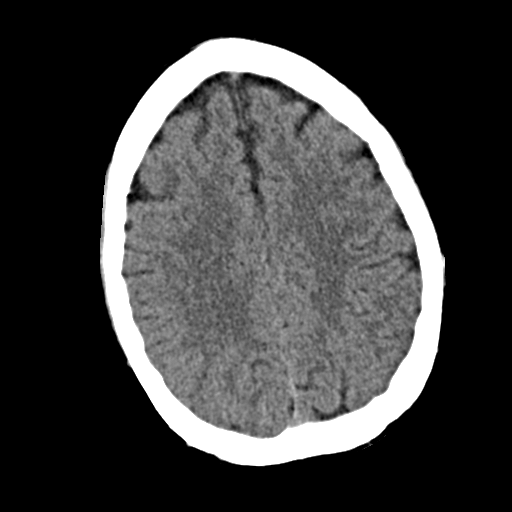
[im 22/30  brain]
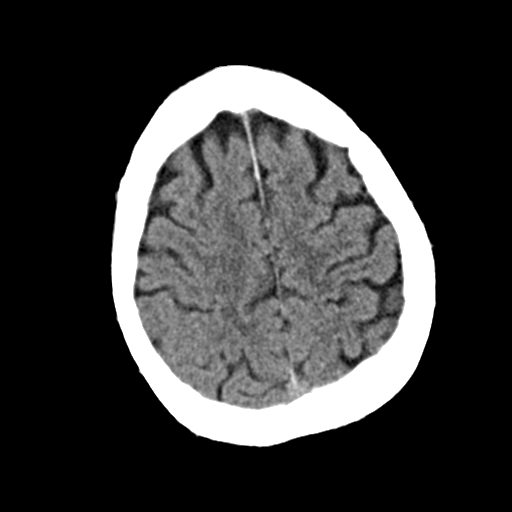
[im 25/30  brain]
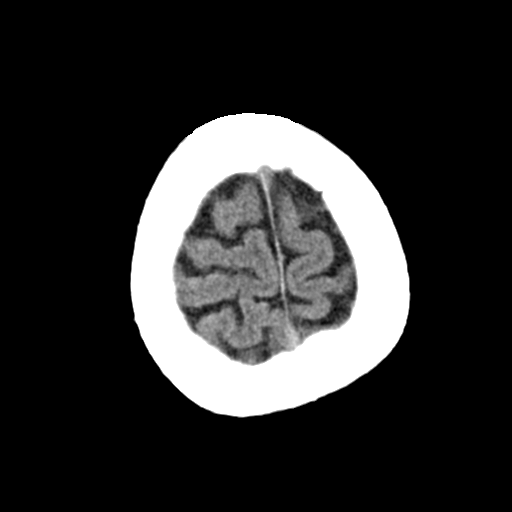
[im 25/30  bone]
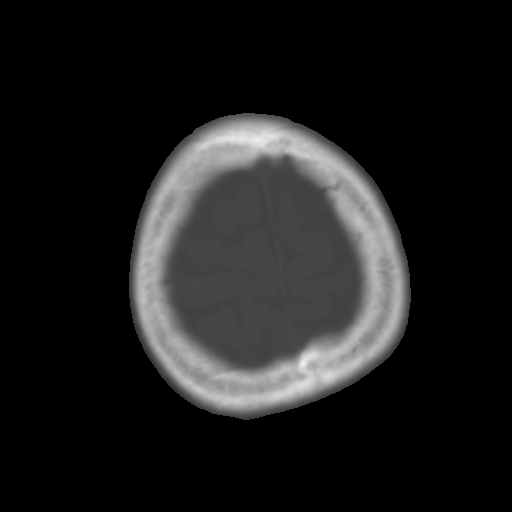
[im 28/30  brain]
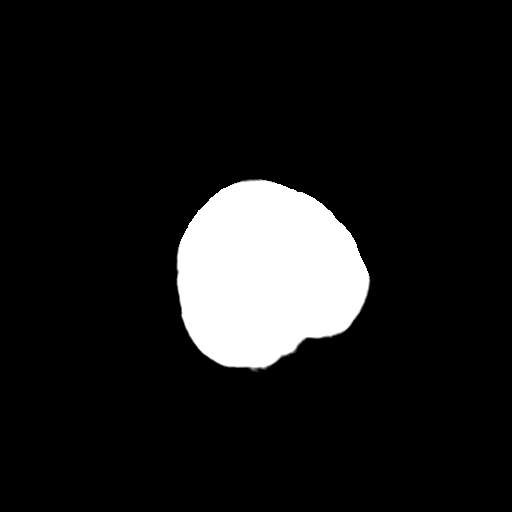

[Series 4: coronal soft · coronal · 0.32mm/px · 3 of 67 slices shown]
[im 17/67  brain]
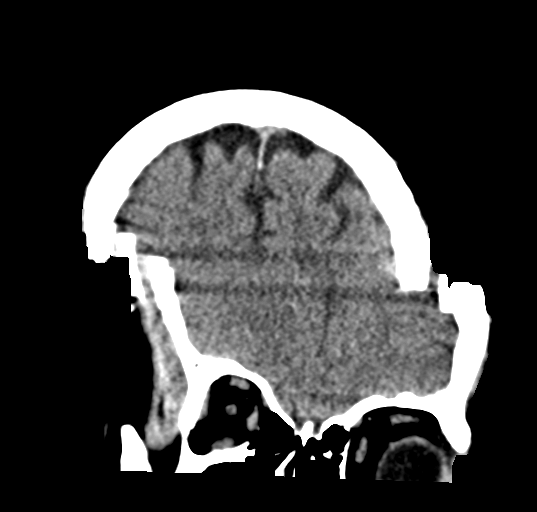
[im 34/67  brain]
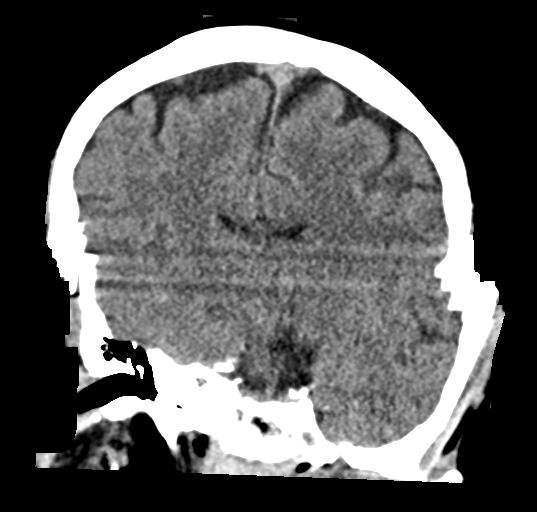
[im 50/67  brain]
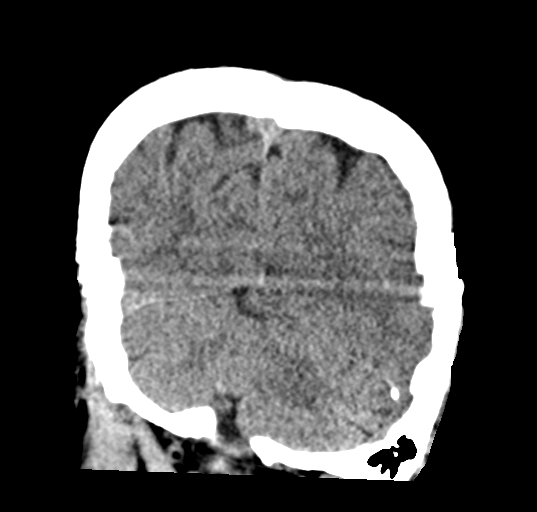

[Series 5: sag soft · sagittal · 0.29mm/px · 2 of 57 slices shown]
[im 19/57  brain]
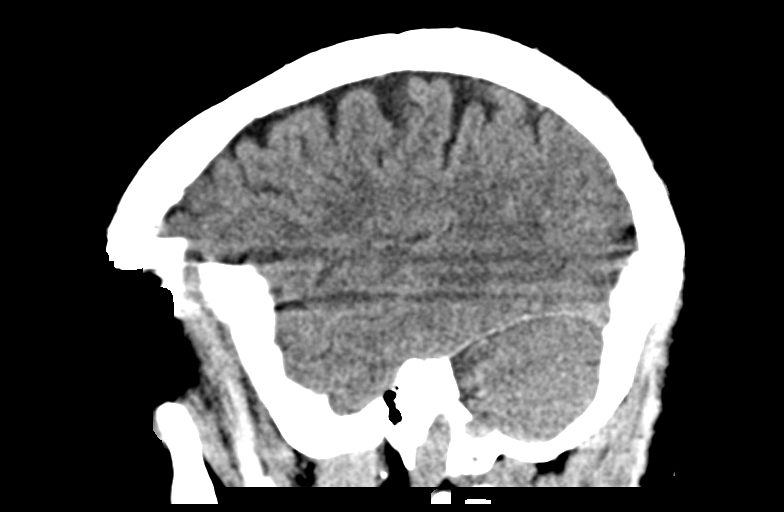
[im 38/57  brain]
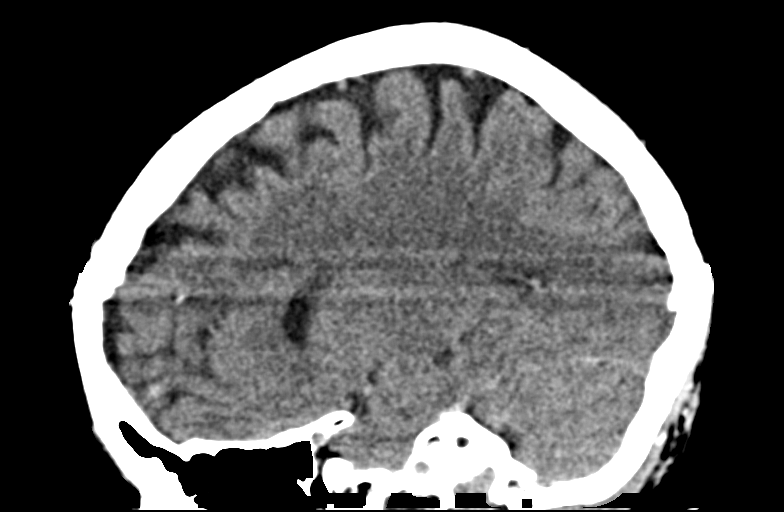

[15 of 47 positions shown; findings below may reference images not displayed]

FINDINGS: Brain: No acute infarct or hemorrhage. Lateral ventricles and
midline structures are unremarkable. No acute extra-axial fluid
collections. No mass effect. Evaluation slightly limited by patient
motion during the exam.

Vascular: No hyperdense vessel or unexpected calcification.

Skull: Normal. Negative for fracture or focal lesion.

Sinuses/Orbits: No acute finding.

Other: None
IMPRESSION: 1. No acute intracranial process.

## 2021-03-03 IMAGING — DX DG CHEST 1V PORT
1 series · 1 of 1 positions shown · non-contrast
Comparison: 11/16/2017

CLINICAL DATA: Chest pain, hyperglycemia

EXAM:
PORTABLE CHEST 1 VIEW

[chest ap]
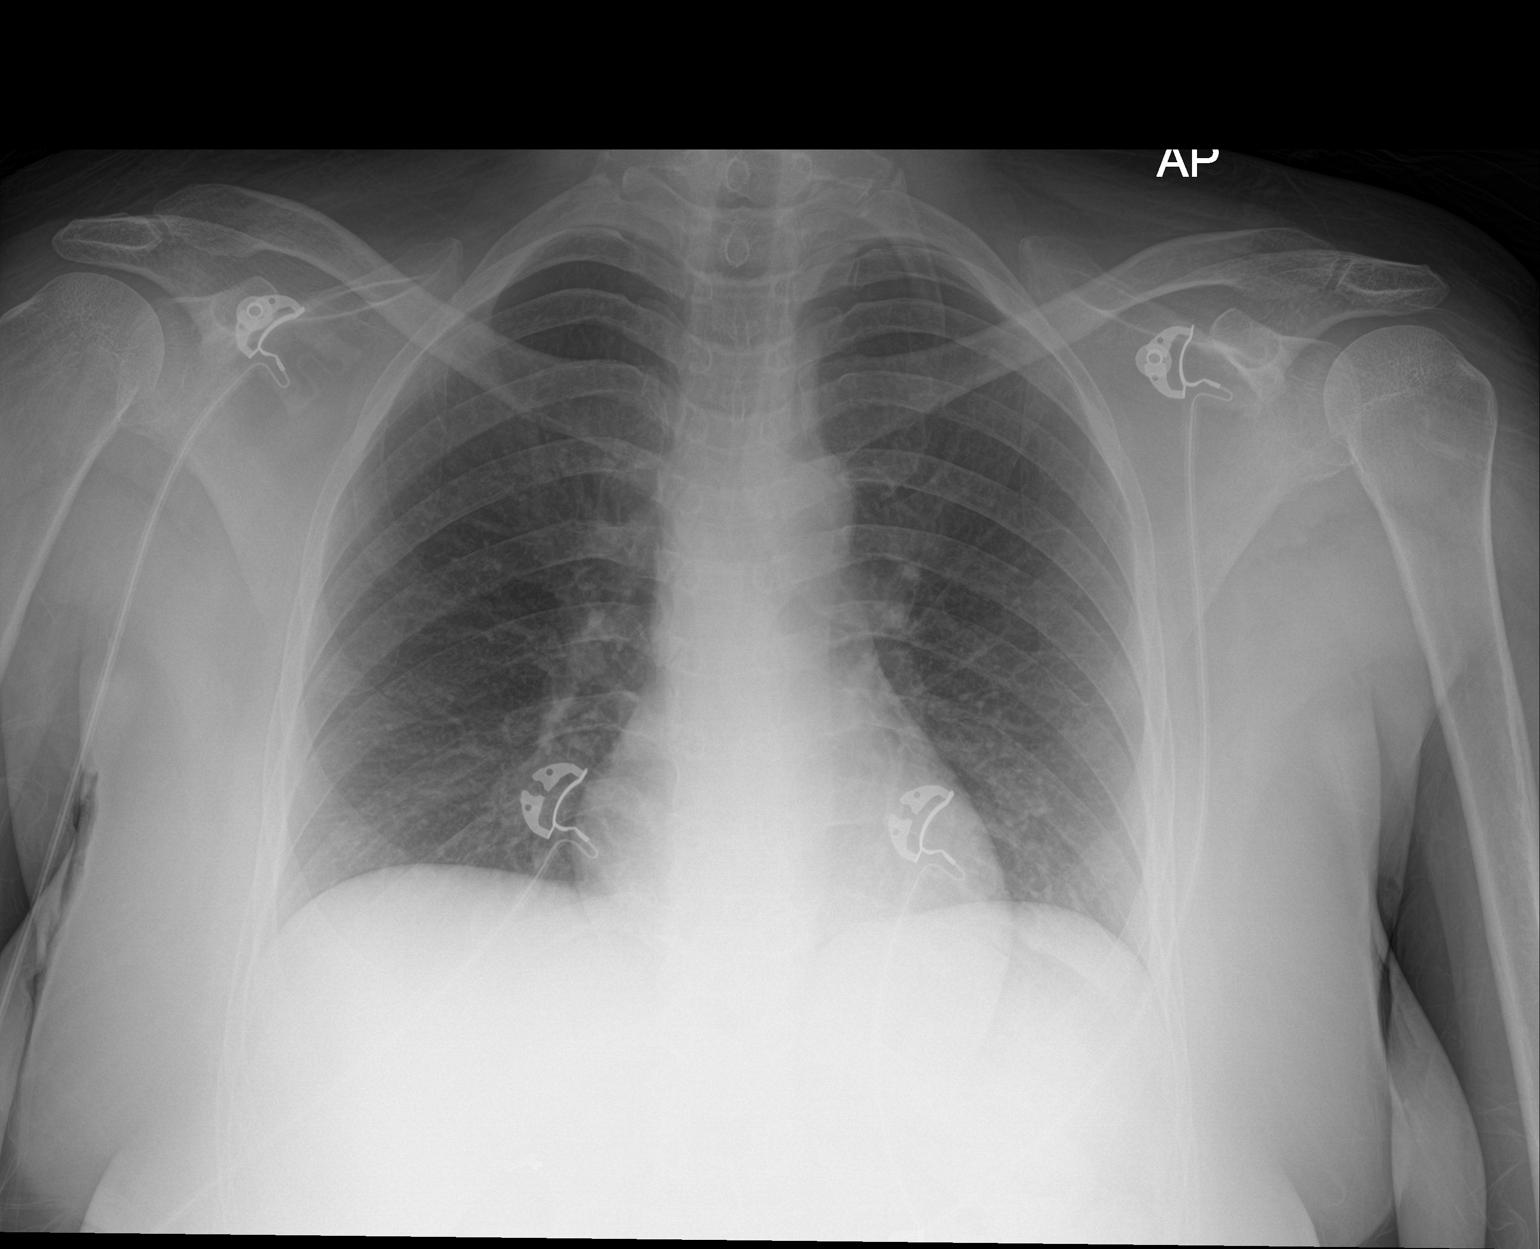

[1 of 1 positions shown; findings below may reference images not displayed]

FINDINGS: The heart size and mediastinal contours are within normal limits.
Both lungs are clear. The visualized skeletal structures are
unremarkable.
IMPRESSION: No active disease.

## 2021-03-04 IMAGING — CT CT ANGIO HEAD
1 of 9 series · 6 of 33 positions shown · IV contrast (Omnipaque)
Comparison: Noncontrast head CT 05/01/2019, brain MRI 02/01/2018,
CT angiogram head/neck 01/31/2018

CLINICAL DATA: Stroke, follow-up.

EXAM:
CT ANGIOGRAPHY HEAD AND NECK
TECHNIQUE: Multidetector CT imaging of the head and neck was performed using
the standard protocol during bolus administration of intravenous
contrast. Multiplanar CT image reconstructions and MIPs were
obtained to evaluate the vascular anatomy. Carotid stenosis
measurements (when applicable) are obtained utilizing NASCET
criteria, using the distal internal carotid diameter as the
denominator.
CONTRAST:  100mL OMNIPAQUE IOHEXOL 350 MG/ML SOLN

[Series 12: axial thin · axial · 0.39mm/px · z∈[-297,-34]mm · 6 of 369 slices shown]
[im 53/369  soft-tissue]
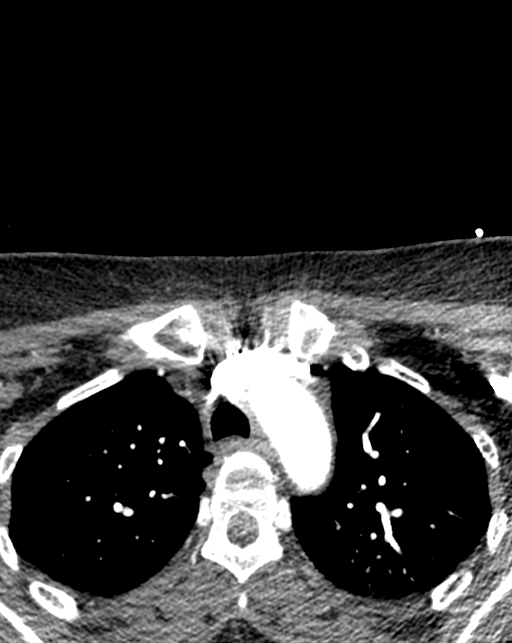
[im 106/369  bone]
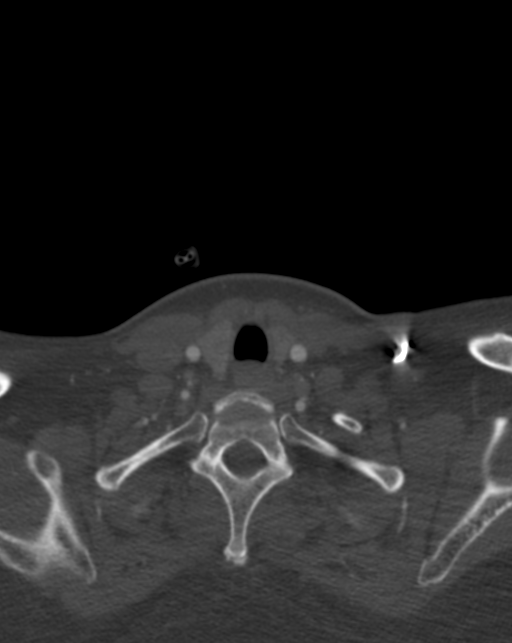
[im 158/369  soft-tissue]
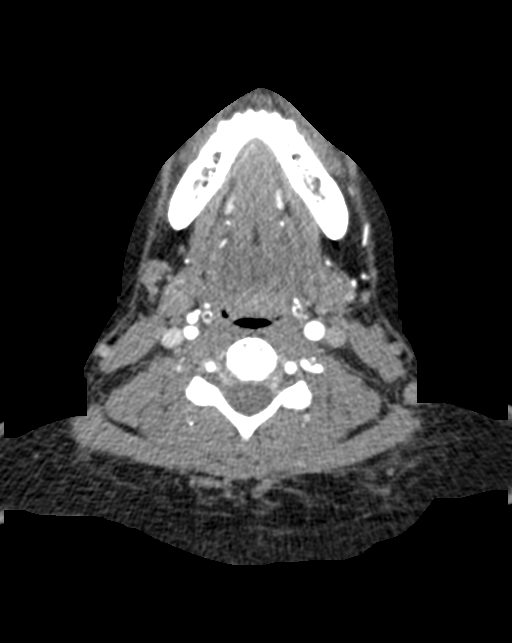
[im 211/369  bone]
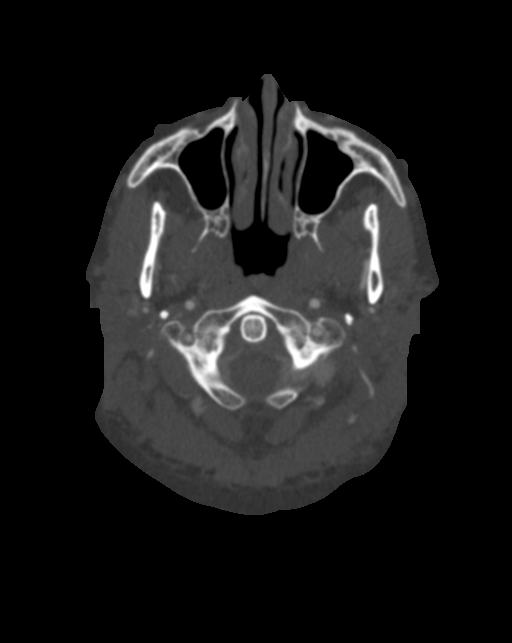
[im 263/369  soft-tissue]
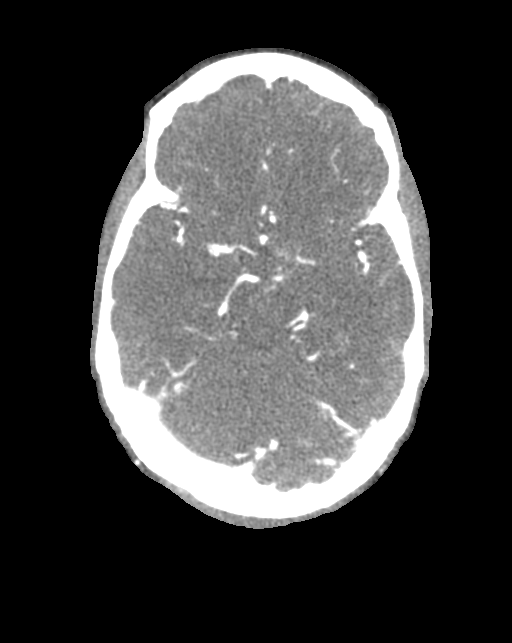
[im 316/369  bone]
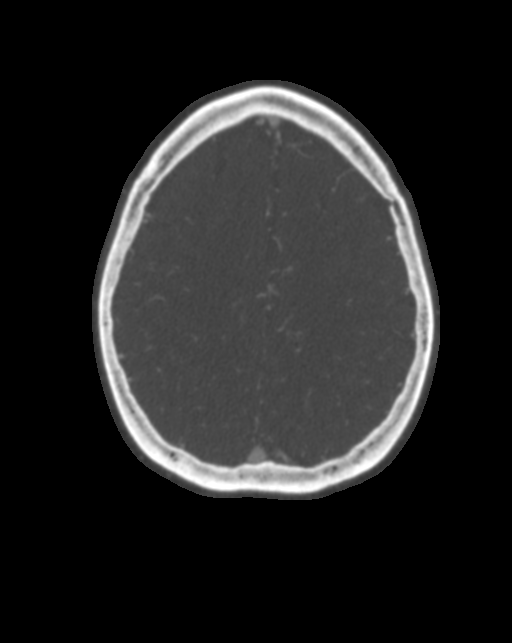

[6 of 33 positions shown; findings below may reference images not displayed]

FINDINGS: CT HEAD FINDINGS

Brain: There is no evidence of acute intracranial hemorrhage,
intracranial mass, midline shift or extra-axial fluid collection.No
demarcated cortical infarction. Cerebral volume is normal.

Vascular: No hyperdense vessel.  Atherosclerotic calcifications.

Skull: Normal. Negative for fracture or focal lesion.

Sinuses: No significant paranasal sinus disease or mastoid effusion.

Orbits: Visualized orbits demonstrate no acute abnormality.

Review of the MIP images confirms the above findings

CTA NECK FINDINGS

Aortic arch: Common origin of the innominate and left common carotid
arteries. The visualized aortic arch is unremarkable. No significant
innominate or proximal subclavian artery stenosis.

Right carotid system: CCA and ICA patent within the neck without
significant stenosis (50% or greater). Mild mixed plaque within the
carotid bifurcation and proximal ICA.

Left carotid system: CCA and ICA patent within the neck without
significant stenosis (50% or greater). Mild calcified plaque within
the carotid bifurcation and proximal ICA.

Vertebral arteries: The vertebral arteries are codominant and patent
within the neck without significant stenosis.

Skeleton: No acute bony abnormality or aggressive osseous lesion.

Other neck: No neck mass or cervical lymphadenopathy.

Upper chest: No consolidation within the imaged lung apices.

Review of the MIP images confirms the above findings

CTA HEAD FINDINGS

Anterior circulation:

The intracranial internal carotid arteries are patent. Mild
calcified plaque within these vessels without significant stenosis.

The M1 middle cerebral arteries are patent without significant
stenosis. No M2 proximal branch occlusion or high-grade proximal
stenosis is identified.

Hypoplastic A1 right ACA. The anterior cerebral arteries are patent
without high-grade proximal stenosis.

No intracranial aneurysm is identified.

Posterior circulation:

The intracranial vertebral arteries are patent without significant
stenosis, as is the basilar artery. The bilateral posterior cerebral
arteries are patent without significant proximal stenosis. Posterior
communicating arteries are poorly delineated and may be hypoplastic
or absent bilaterally.

Venous sinuses: Within limitations of contrast timing, no convincing
thrombus.

Anatomic variants: As described

Review of the MIP images confirms the above findings
IMPRESSION: CT head:

Unremarkable non-contrast CT appearance of the brain. No evidence of
acute intracranial abnormality.

CTA neck:

The bilateral common carotid, internal carotid and vertebral
arteries are patent within the neck without significant stenosis.
Mild atherosclerotic plaque within the carotid bifurcations and
proximal ICAs bilaterally.

CTA head:

No intracranial large vessel occlusion or proximal high-grade
arterial stenosis.

## 2021-03-04 IMAGING — CT CT ANGIO NECK
1 of 11 series · 5 of 33 positions shown · IV contrast (Omnipaque)
Comparison: Noncontrast head CT 05/01/2019, brain MRI 02/01/2018,
CT angiogram head/neck 01/31/2018

CLINICAL DATA: Stroke, follow-up.

EXAM:
CT ANGIOGRAPHY HEAD AND NECK
TECHNIQUE: Multidetector CT imaging of the head and neck was performed using
the standard protocol during bolus administration of intravenous
contrast. Multiplanar CT image reconstructions and MIPs were
obtained to evaluate the vascular anatomy. Carotid stenosis
measurements (when applicable) are obtained utilizing NASCET
criteria, using the distal internal carotid diameter as the
denominator.
CONTRAST:  100mL OMNIPAQUE IOHEXOL 350 MG/ML SOLN

[Series 12: axial thin · axial · 0.39mm/px · z∈[-288,-43]mm · 5 of 369 slices shown]
[im 62/369  soft-tissue]
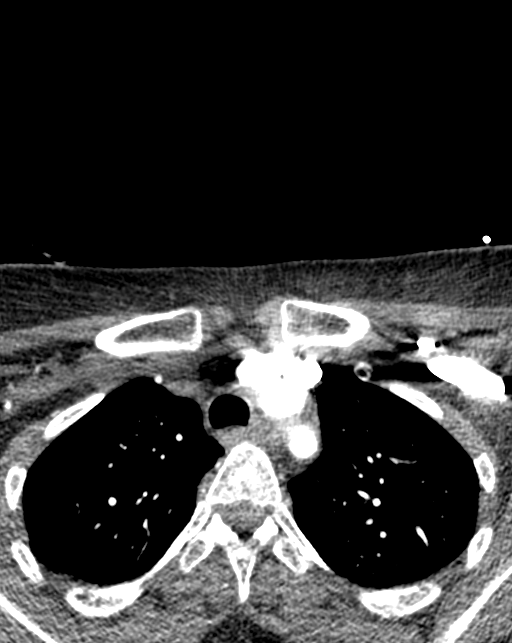
[im 123/369  bone]
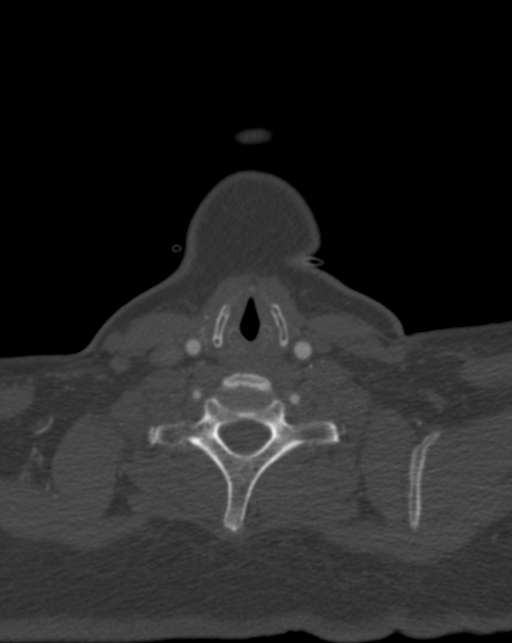
[im 185/369  soft-tissue]
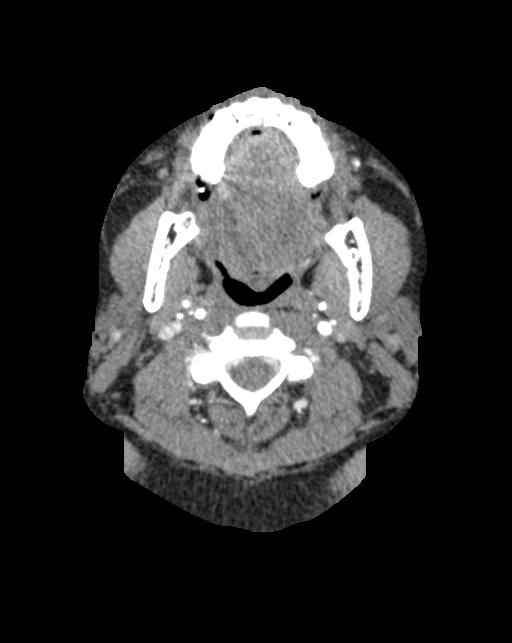
[im 246/369  bone]
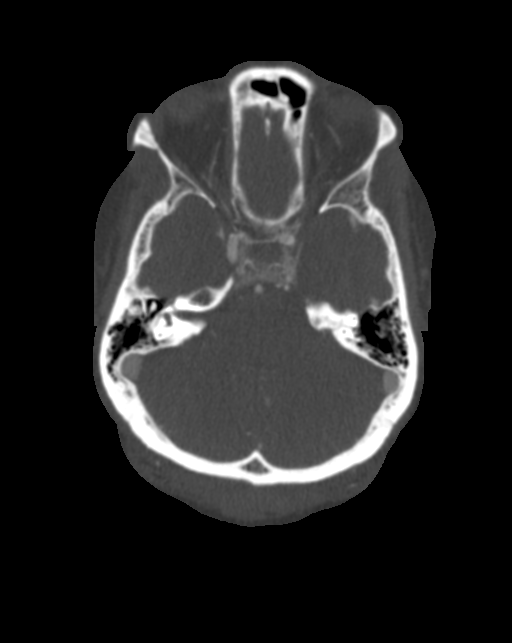
[im 307/369  soft-tissue]
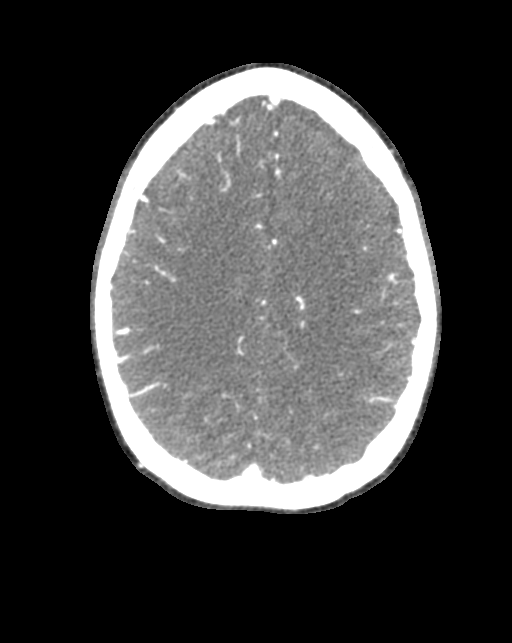

[5 of 33 positions shown; findings below may reference images not displayed]

FINDINGS: CT HEAD FINDINGS

Brain: There is no evidence of acute intracranial hemorrhage,
intracranial mass, midline shift or extra-axial fluid collection.No
demarcated cortical infarction. Cerebral volume is normal.

Vascular: No hyperdense vessel.  Atherosclerotic calcifications.

Skull: Normal. Negative for fracture or focal lesion.

Sinuses: No significant paranasal sinus disease or mastoid effusion.

Orbits: Visualized orbits demonstrate no acute abnormality.

Review of the MIP images confirms the above findings

CTA NECK FINDINGS

Aortic arch: Common origin of the innominate and left common carotid
arteries. The visualized aortic arch is unremarkable. No significant
innominate or proximal subclavian artery stenosis.

Right carotid system: CCA and ICA patent within the neck without
significant stenosis (50% or greater). Mild mixed plaque within the
carotid bifurcation and proximal ICA.

Left carotid system: CCA and ICA patent within the neck without
significant stenosis (50% or greater). Mild calcified plaque within
the carotid bifurcation and proximal ICA.

Vertebral arteries: The vertebral arteries are codominant and patent
within the neck without significant stenosis.

Skeleton: No acute bony abnormality or aggressive osseous lesion.

Other neck: No neck mass or cervical lymphadenopathy.

Upper chest: No consolidation within the imaged lung apices.

Review of the MIP images confirms the above findings

CTA HEAD FINDINGS

Anterior circulation:

The intracranial internal carotid arteries are patent. Mild
calcified plaque within these vessels without significant stenosis.

The M1 middle cerebral arteries are patent without significant
stenosis. No M2 proximal branch occlusion or high-grade proximal
stenosis is identified.

Hypoplastic A1 right ACA. The anterior cerebral arteries are patent
without high-grade proximal stenosis.

No intracranial aneurysm is identified.

Posterior circulation:

The intracranial vertebral arteries are patent without significant
stenosis, as is the basilar artery. The bilateral posterior cerebral
arteries are patent without significant proximal stenosis. Posterior
communicating arteries are poorly delineated and may be hypoplastic
or absent bilaterally.

Venous sinuses: Within limitations of contrast timing, no convincing
thrombus.

Anatomic variants: As described

Review of the MIP images confirms the above findings
IMPRESSION: CT head:

Unremarkable non-contrast CT appearance of the brain. No evidence of
acute intracranial abnormality.

CTA neck:

The bilateral common carotid, internal carotid and vertebral
arteries are patent within the neck without significant stenosis.
Mild atherosclerotic plaque within the carotid bifurcations and
proximal ICAs bilaterally.

CTA head:

No intracranial large vessel occlusion or proximal high-grade
arterial stenosis.

## 2021-03-04 IMAGING — MR MR HEAD W/O CM
12 of 13 series · 44 of 48 positions shown · non-contrast
Comparison: CT head without contrast 05/01/2019. CTA head and neck
05/02/2019

CLINICAL DATA: Focal neuro deficit of greater than 6 hours. Stroke
suspected. Patient presented yesterday with hyperglycemia and
altered level of consciousness. She was transferred from the [REDACTED] to the hospital today.

EXAM:
MRI HEAD WITHOUT CONTRAST
TECHNIQUE: Multiplanar, multiecho pulse sequences of the brain and surrounding
structures were obtained without intravenous contrast.

[Series 5: DWI · axial · 3.0mm · 0.88mm/px · z∈[-112,+26]mm · 7 of 96 slices shown (1 of 4)]
[im 1/96]
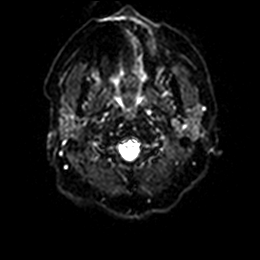
[im 16/96]
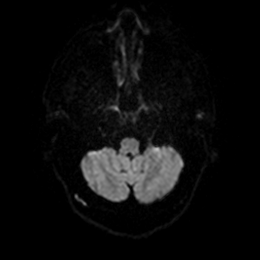
[im 32/96]
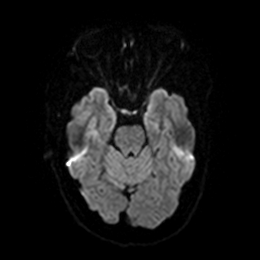
[im 48/96]
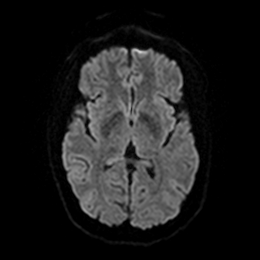
[im 64/96]
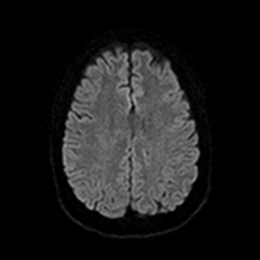
[im 80/96]
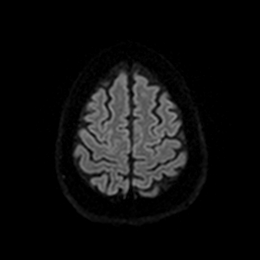
[im 96/96]
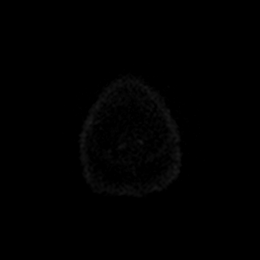

[Series 6: DWI · axial · 3.0mm · 0.88mm/px · z∈[-112,+26]mm · 4 of 48 slices shown (2 of 4)]
[im 1/48]
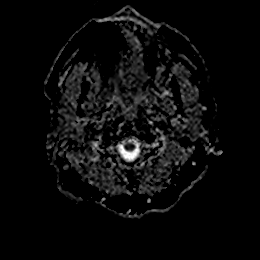
[im 16/48]
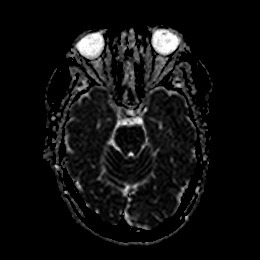
[im 32/48]
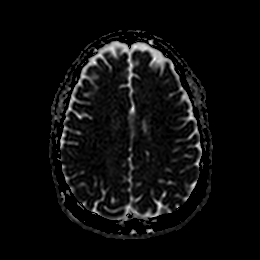
[im 48/48]
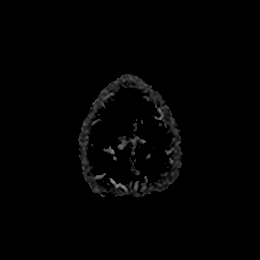

[Series 7: DWI · coronal · 4.0mm · 0.88mm/px · 6 of 72 slices shown (3 of 4)]
[im 1/72]
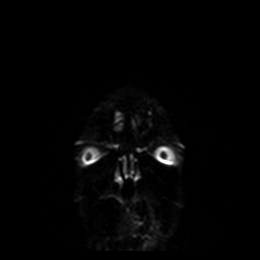
[im 15/72]
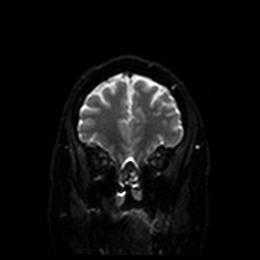
[im 29/72]
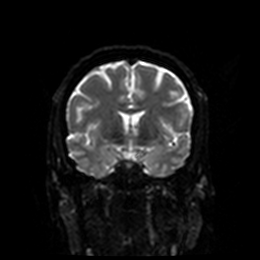
[im 43/72]
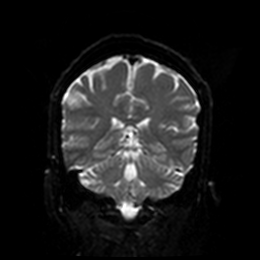
[im 57/72]
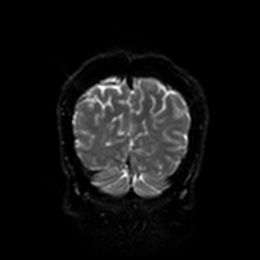
[im 72/72]
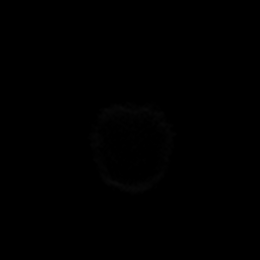

[Series 8: DWI · coronal · 4.0mm · 0.88mm/px · 3 of 36 slices shown (4 of 4)]
[im 1/36]
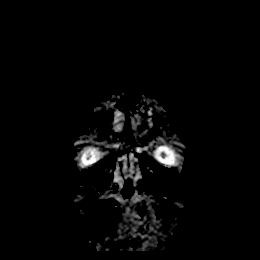
[im 18/36]
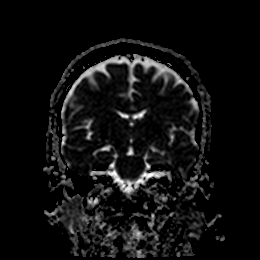
[im 36/36]
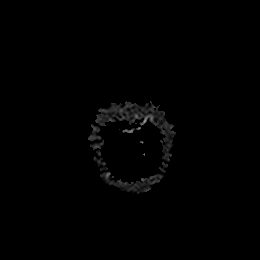

[Series 9: T1 · sagittal · 5.0mm · 0.75mm/px · 2 of 23 slices shown]
[im 1/23]
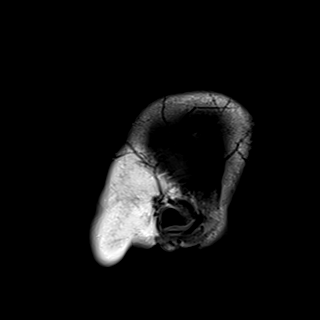
[im 23/23]
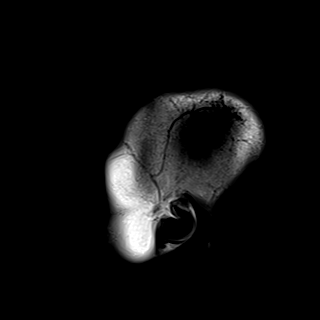

[Series 10: T2 · axial · 5.0mm · 0.72mm/px · z∈[-116,+25]mm · 2 of 25 slices shown (1 of 2)]
[im 1/25]
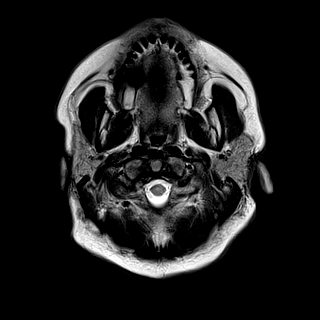
[im 25/25]
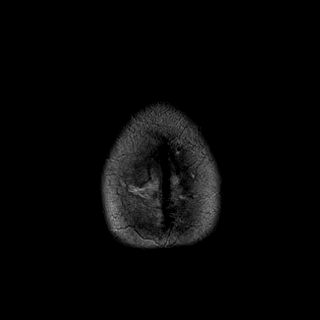

[Series 11: FLAIR · axial · 5.0mm · 0.45mm/px · z∈[-116,+25]mm · 2 of 25 slices shown]
[im 1/25]
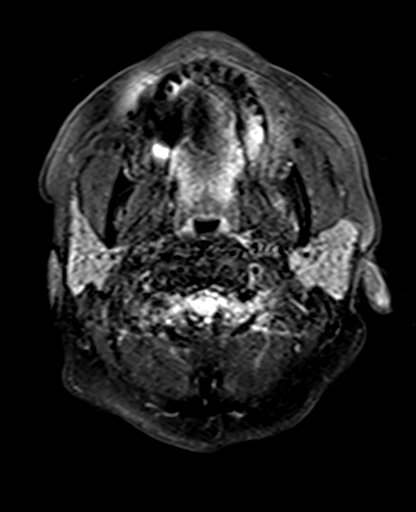
[im 25/25]
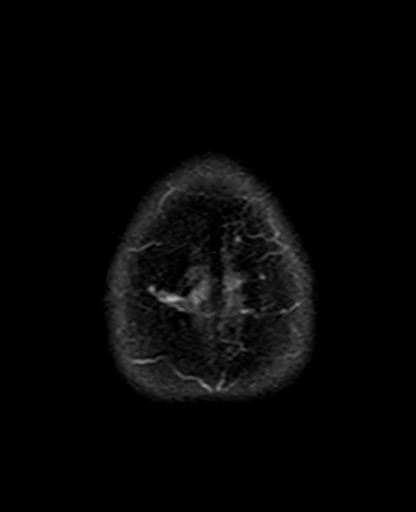

[Series 12: mag_images · axial · 3.0mm · 0.90mm/px · z∈[-124,+26]mm · 4 of 52 slices shown]
[im 1/52]
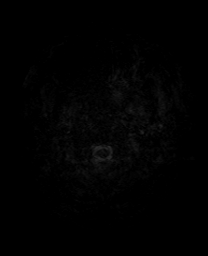
[im 18/52]
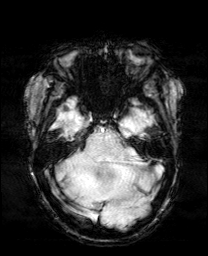
[im 35/52]
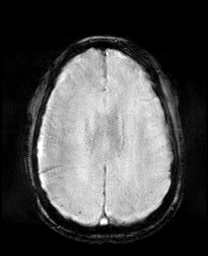
[im 52/52]
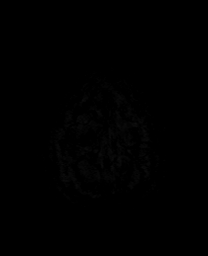

[Series 13: pha_images · axial · 3.0mm · 0.90mm/px · z∈[-124,+23]mm · 4 of 51 slices shown]
[im 1/51]
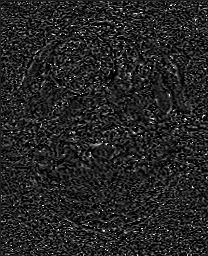
[im 17/51]
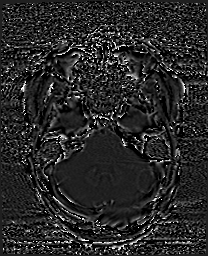
[im 34/51]
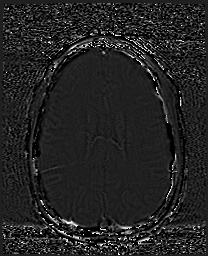
[im 51/51]
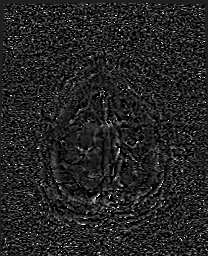

[Series 14: swi_images · axial · 3.0mm · 0.90mm/px · z∈[-124,+26]mm · 4 of 52 slices shown]
[im 1/52]
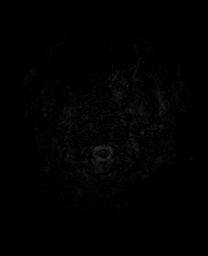
[im 18/52]
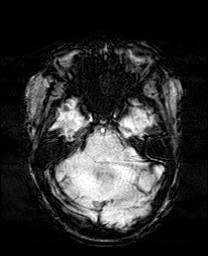
[im 35/52]
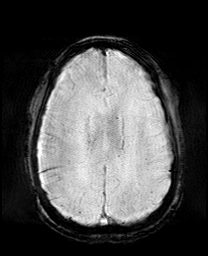
[im 52/52]
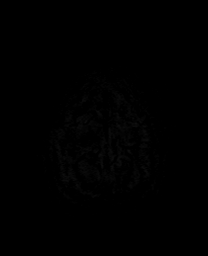

[Series 15: mip_images(sw) · axial · 24.0mm · 0.90mm/px · z∈[-113,+16]mm · 4 of 45 slices shown]
[im 1/45]
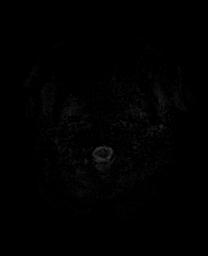
[im 15/45]
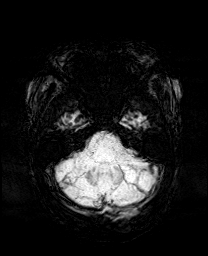
[im 30/45]
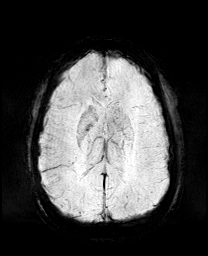
[im 45/45]
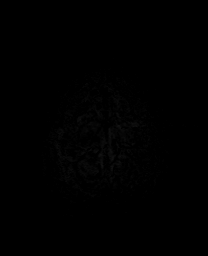

[Series 17: T2 · coronal · 5.0mm · 0.34mm/px · 2 of 29 slices shown (2 of 2)]
[im 1/29]
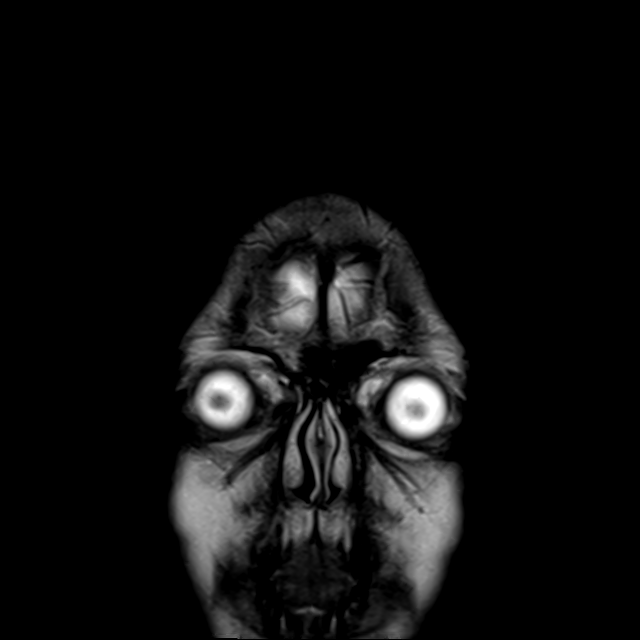
[im 29/29]
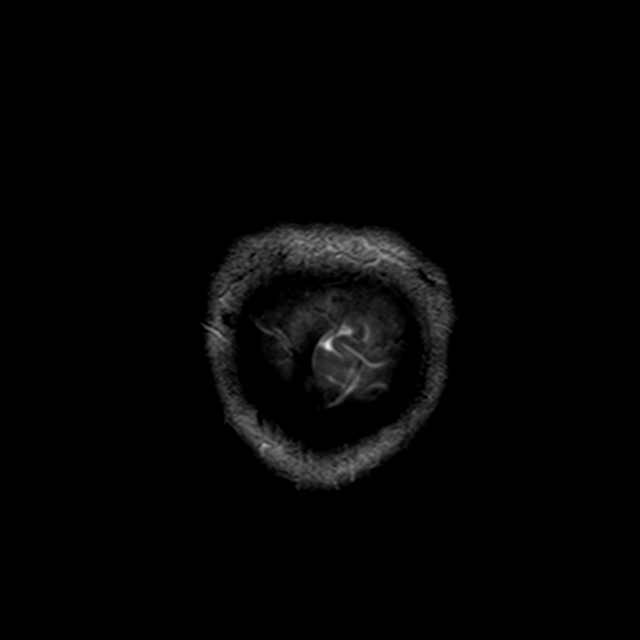

[44 of 48 positions shown; findings below may reference images not displayed]

FINDINGS: Brain: No acute infarct, hemorrhage, or mass lesion is present. No
significant white matter lesions are present. The ventricles are of
normal size. No significant extraaxial fluid collection is present.

The internal auditory canals are within normal limits. The brainstem
and cerebellum are within normal limits.

Vascular: Flow is present in the major intracranial arteries.

Skull and upper cervical spine: The craniocervical junction is
normal. Upper cervical spine is within normal limits. Marrow signal
is unremarkable.

Sinuses/Orbits: The paranasal sinuses and mastoid air cells are
clear. The globes and orbits are within normal limits.
IMPRESSION: Negative MRI of the brain.

## 2021-04-04 IMAGING — DX DG ANKLE COMPLETE 3+V*L*
3 series · 3 of 3 positions shown · non-contrast
Comparison: Left foot x-rays dated October 08, 2017.

CLINICAL DATA: Fall.

EXAM:
LEFT ANKLE COMPLETE - 3+ VIEW

[ankle ap]
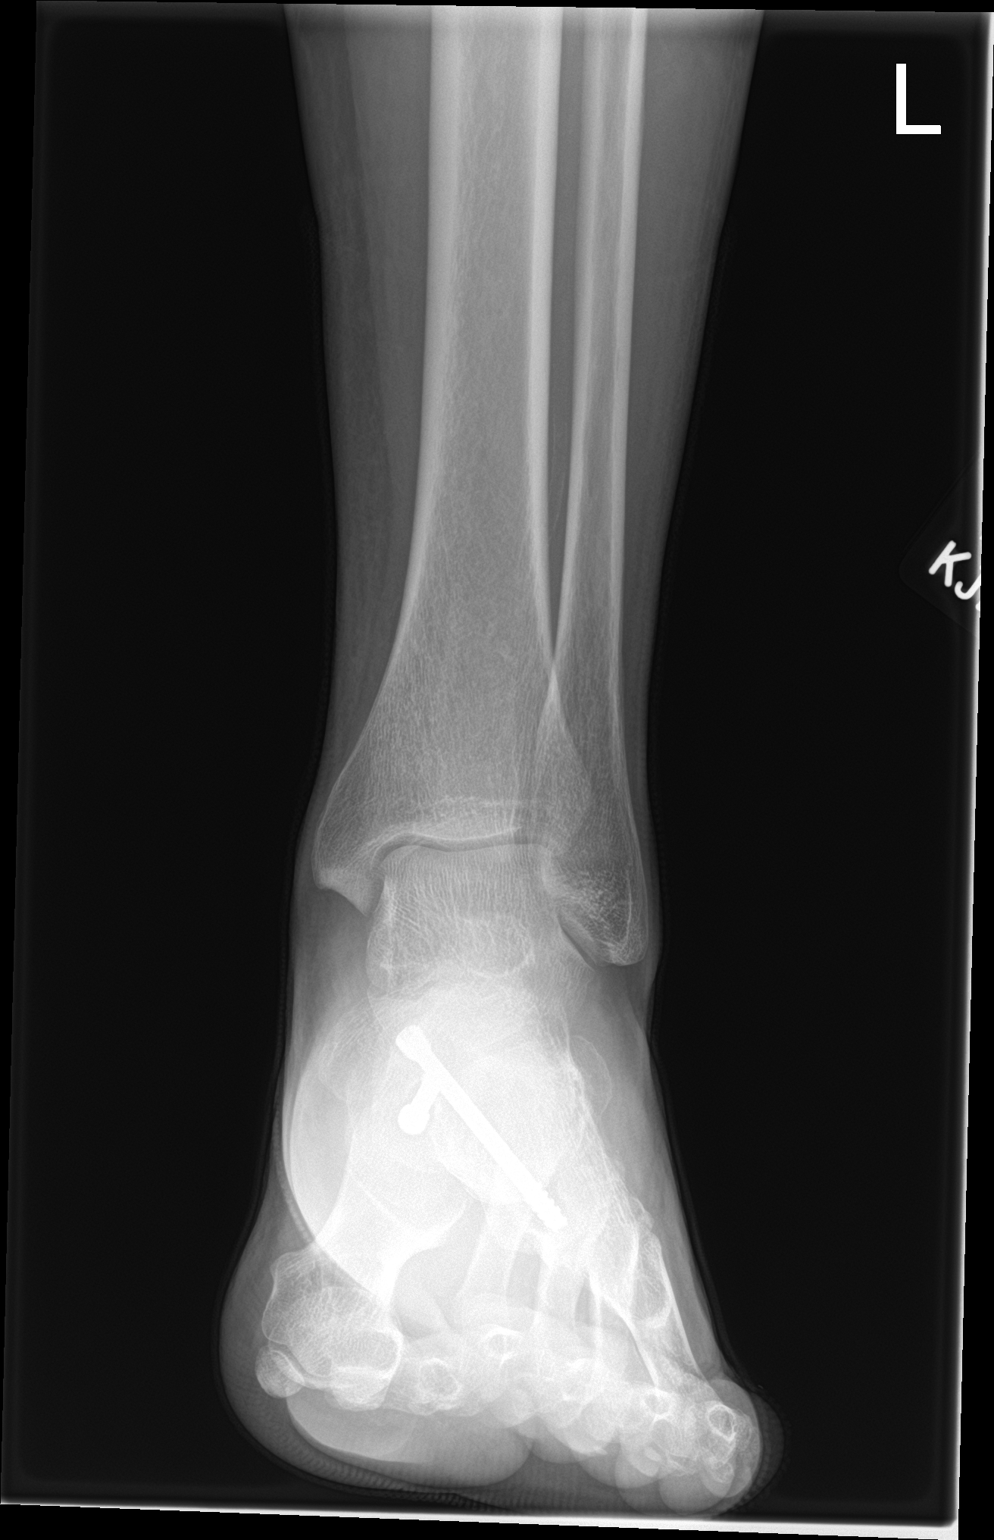

[ankle obl]
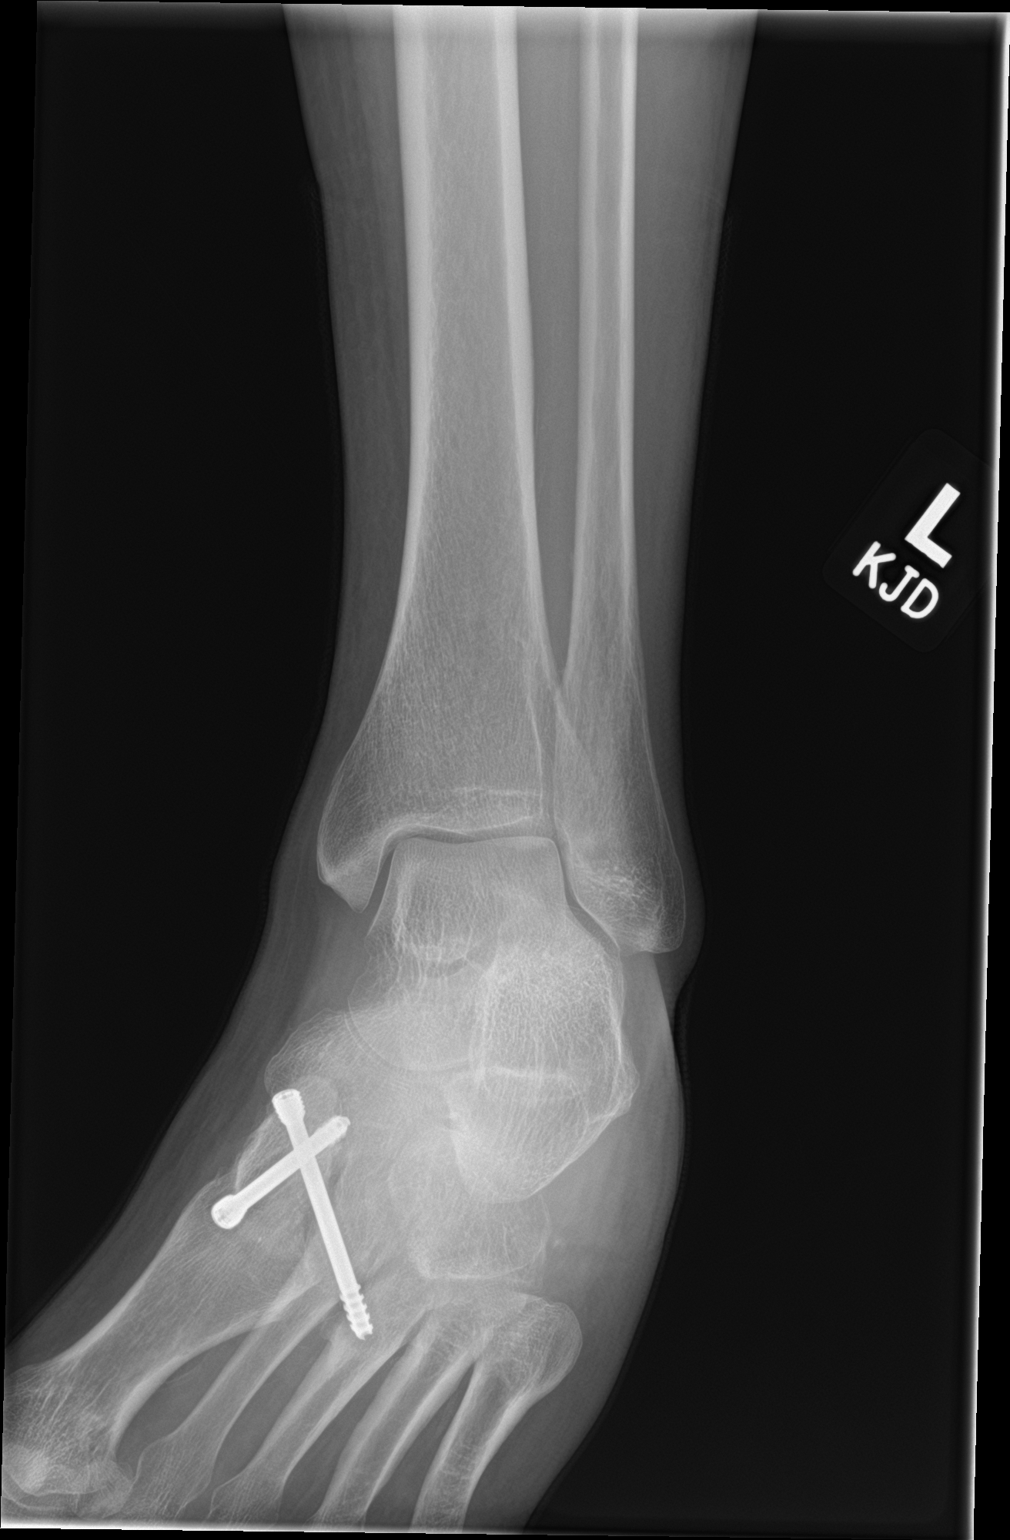

[ankle lat]
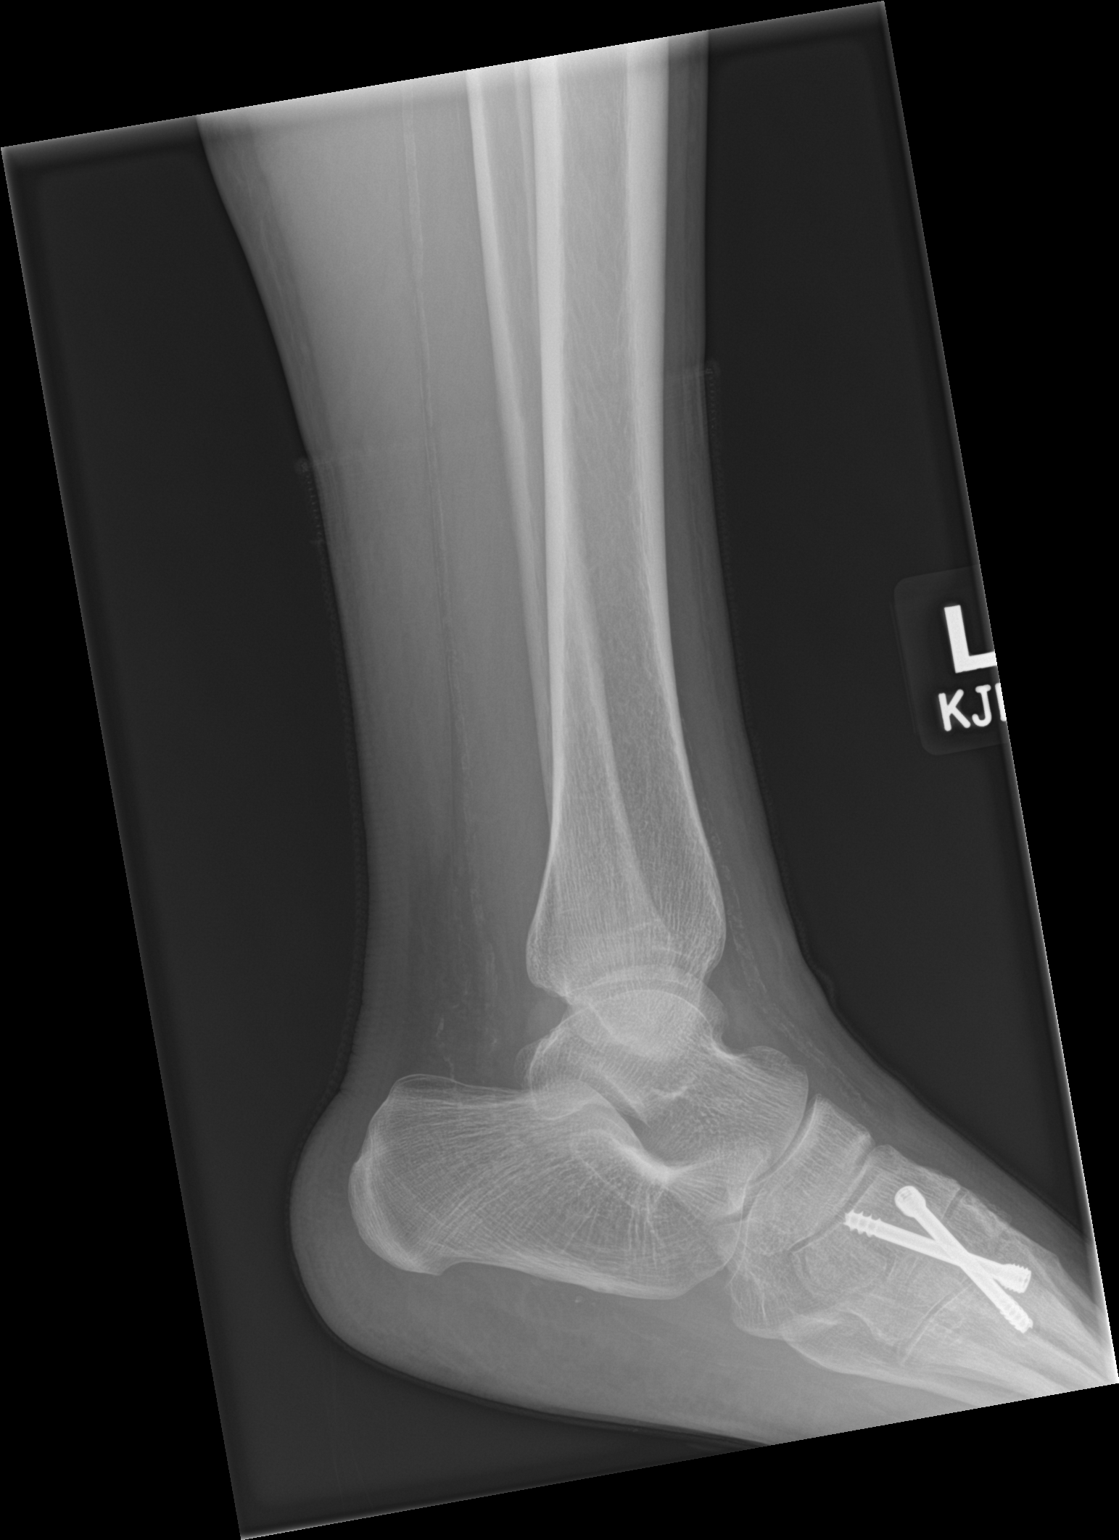

[3 of 3 positions shown; findings below may reference images not displayed]

FINDINGS: No acute fracture or dislocation. Unchanged Lisfranc hardware. The
ankle mortise is symmetric. The talar dome is intact. No tibiotalar
joint effusion. Joint spaces are preserved. Mild osteopenia. Soft
tissues are unremarkable. Vascular calcifications.
IMPRESSION: No acute osseous abnormality.

## 2021-04-04 IMAGING — DX DG LUMBAR SPINE COMPLETE 4+V
5 series · 5 of 5 positions shown · non-contrast
Comparison: Lumbar spine x-rays dated July 06, 2017.

CLINICAL DATA: Fall.

EXAM:
LUMBAR SPINE - COMPLETE 4+ VIEW

[l-spine ap]
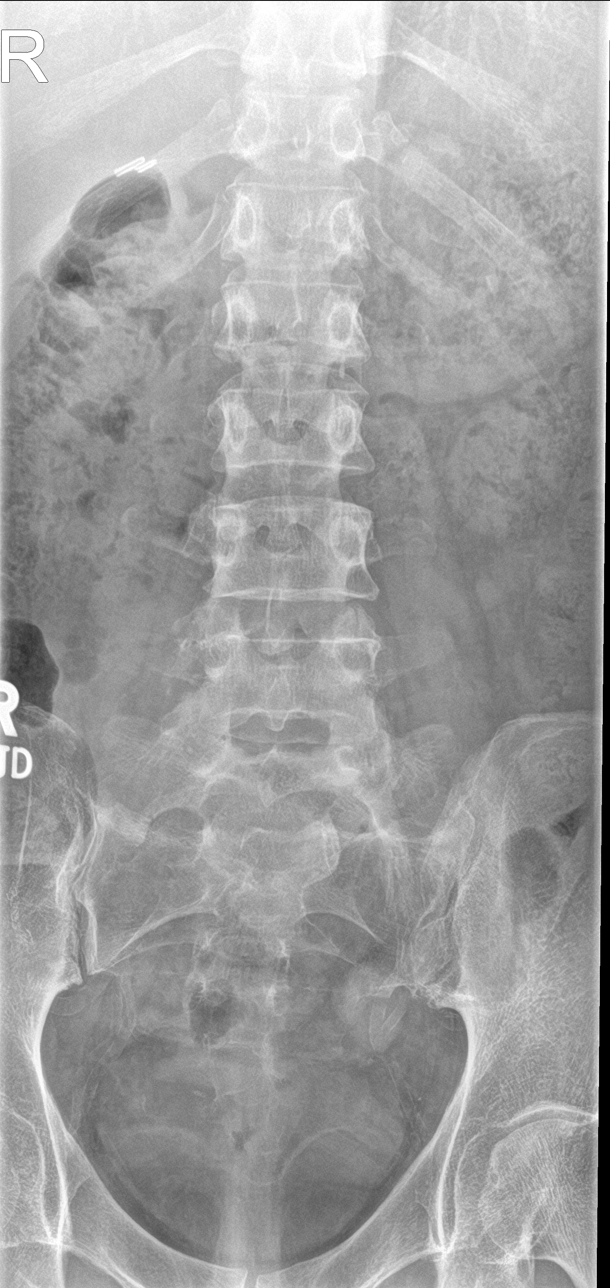

[l-spine obl (1 of 2)]
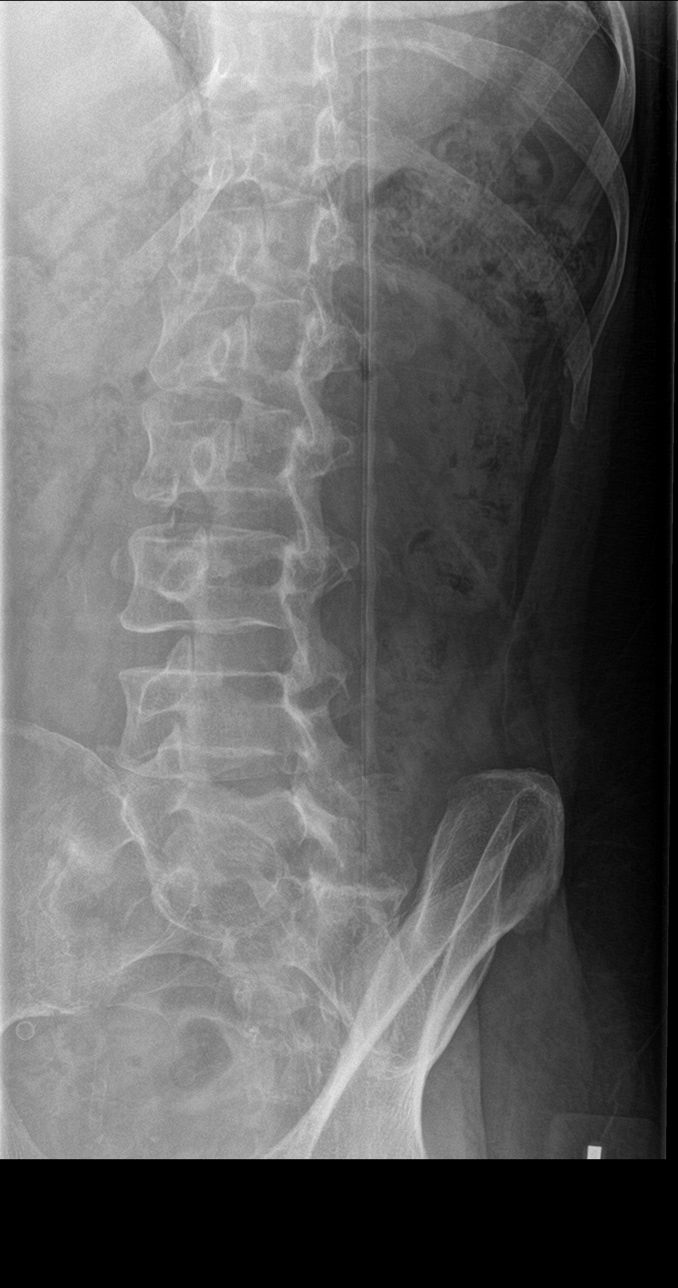

[l-spine obl (2 of 2)]
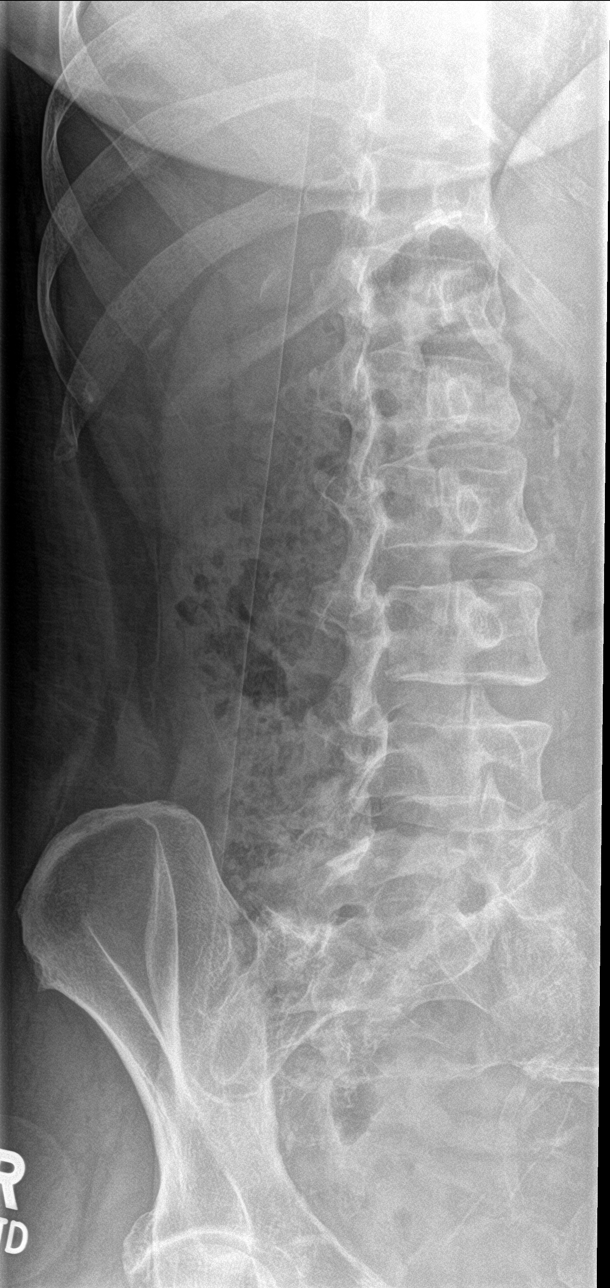

[l-spine lat]
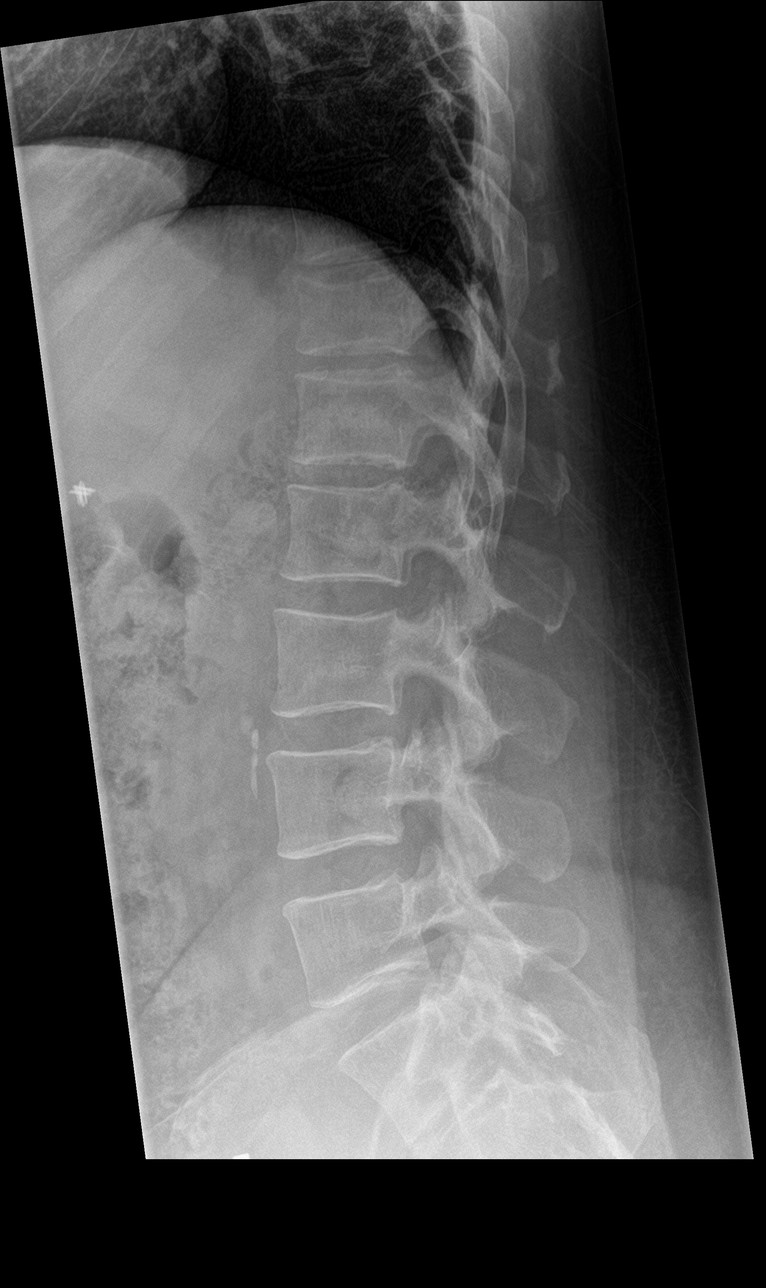

[l-spine spot]
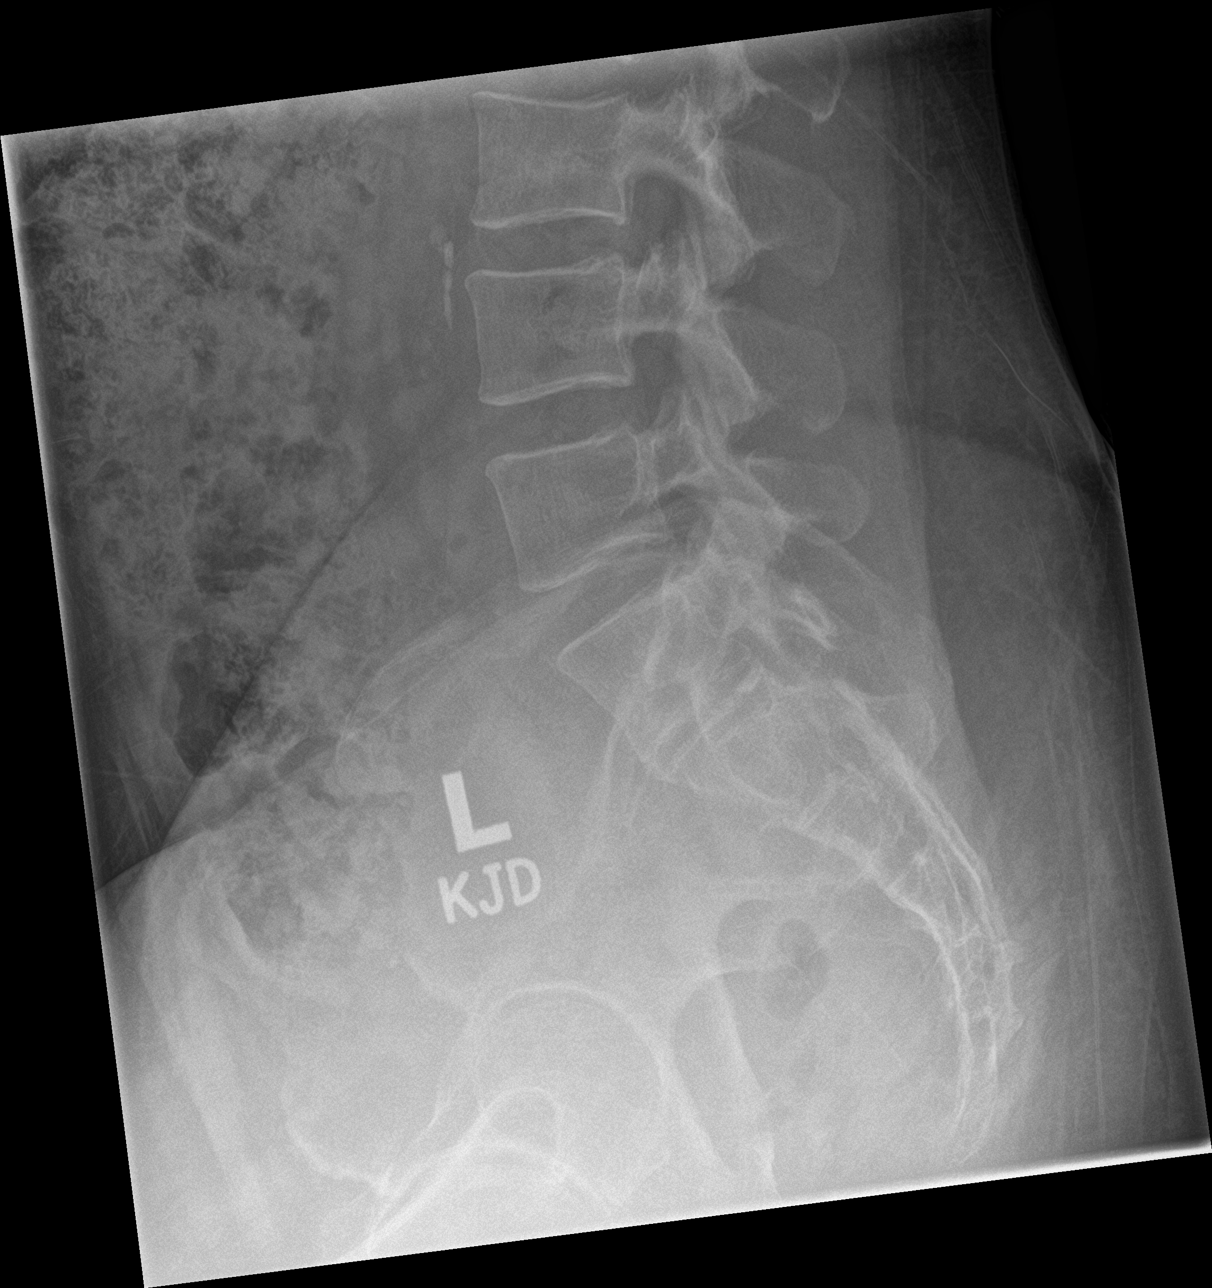

[5 of 5 positions shown; findings below may reference images not displayed]

FINDINGS: Unchanged transitional thoracolumbar and lumbosacral anatomy. No
acute fracture or subluxation. Vertebral body heights are preserved.

Alignment is normal.  Intervertebral disc spaces are maintained.

The sacroiliac joints are unremarkable.  Prior cholecystectomy.
IMPRESSION: No acute osseous abnormality.

## 2021-04-04 IMAGING — DX DG FOOT COMPLETE 3+V*L*
3 series · 3 of 3 positions shown · non-contrast
Comparison: Left foot x-rays dated October 08, 2017.

CLINICAL DATA: Fall.

EXAM:
LEFT FOOT - COMPLETE 3+ VIEW

[foot ap]
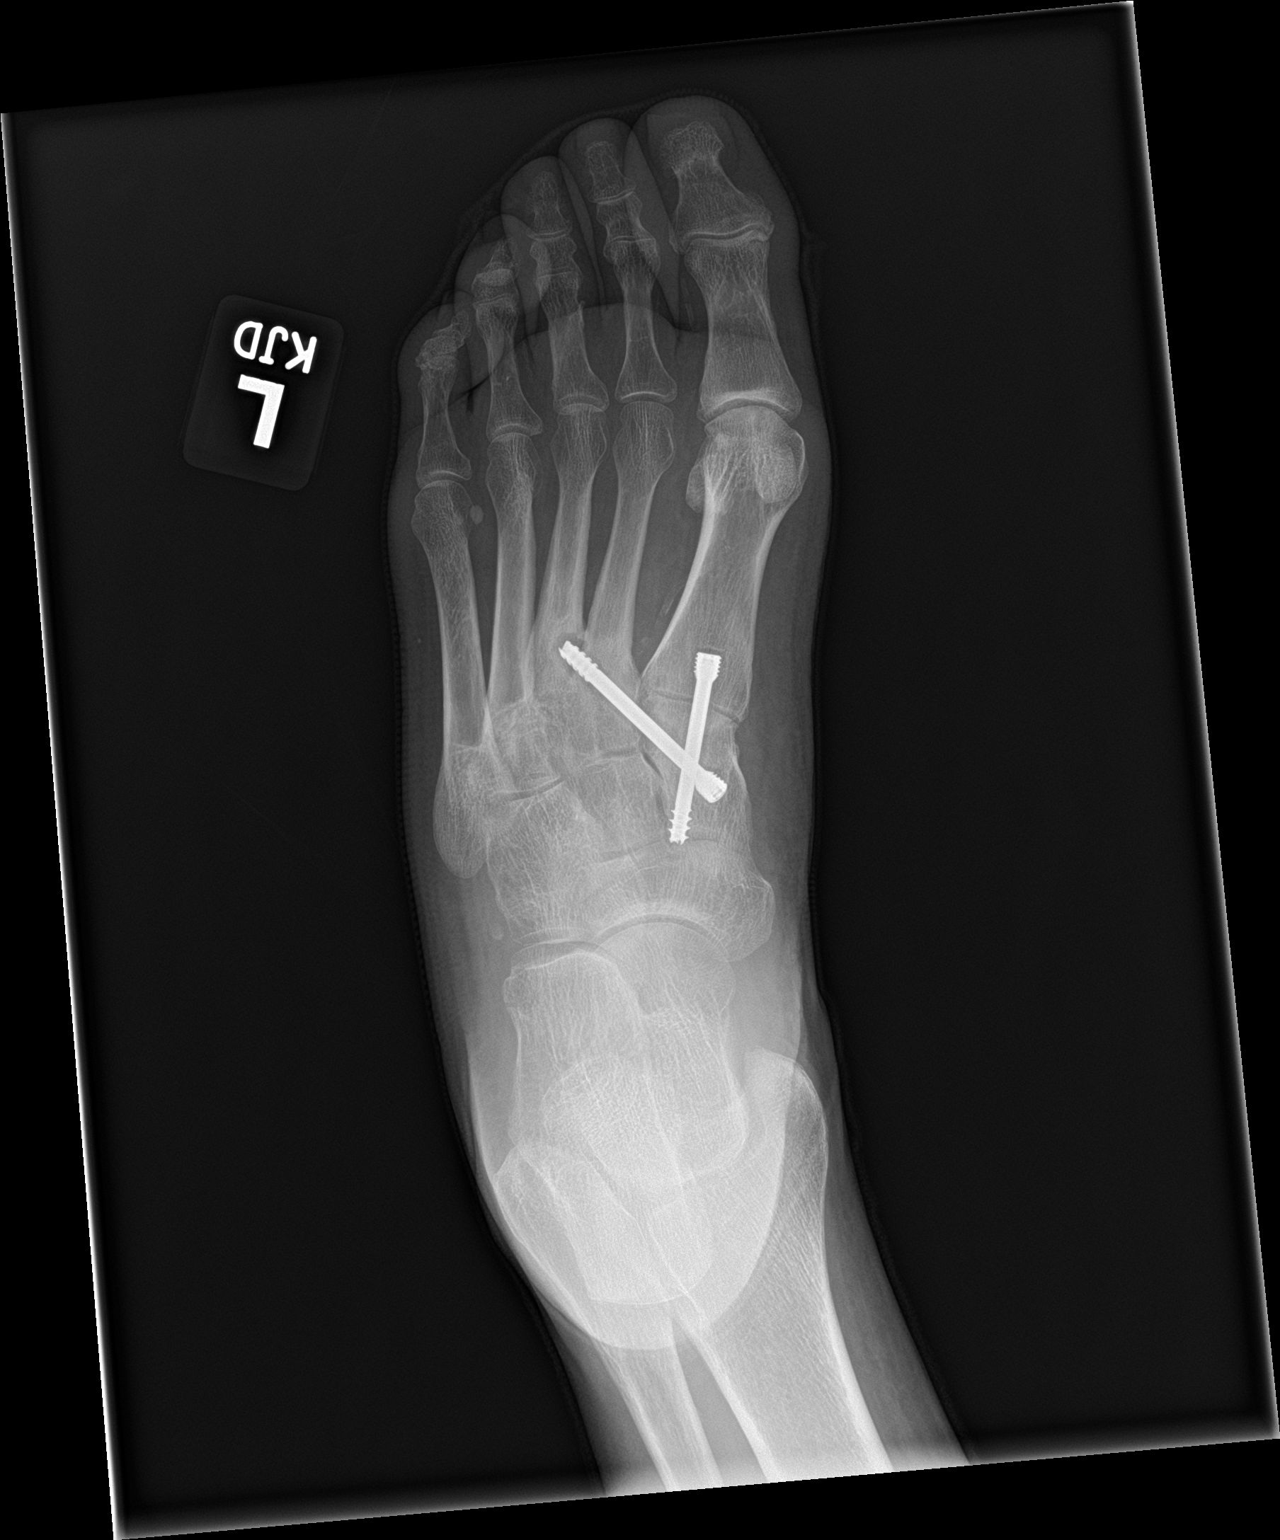

[foot obl]
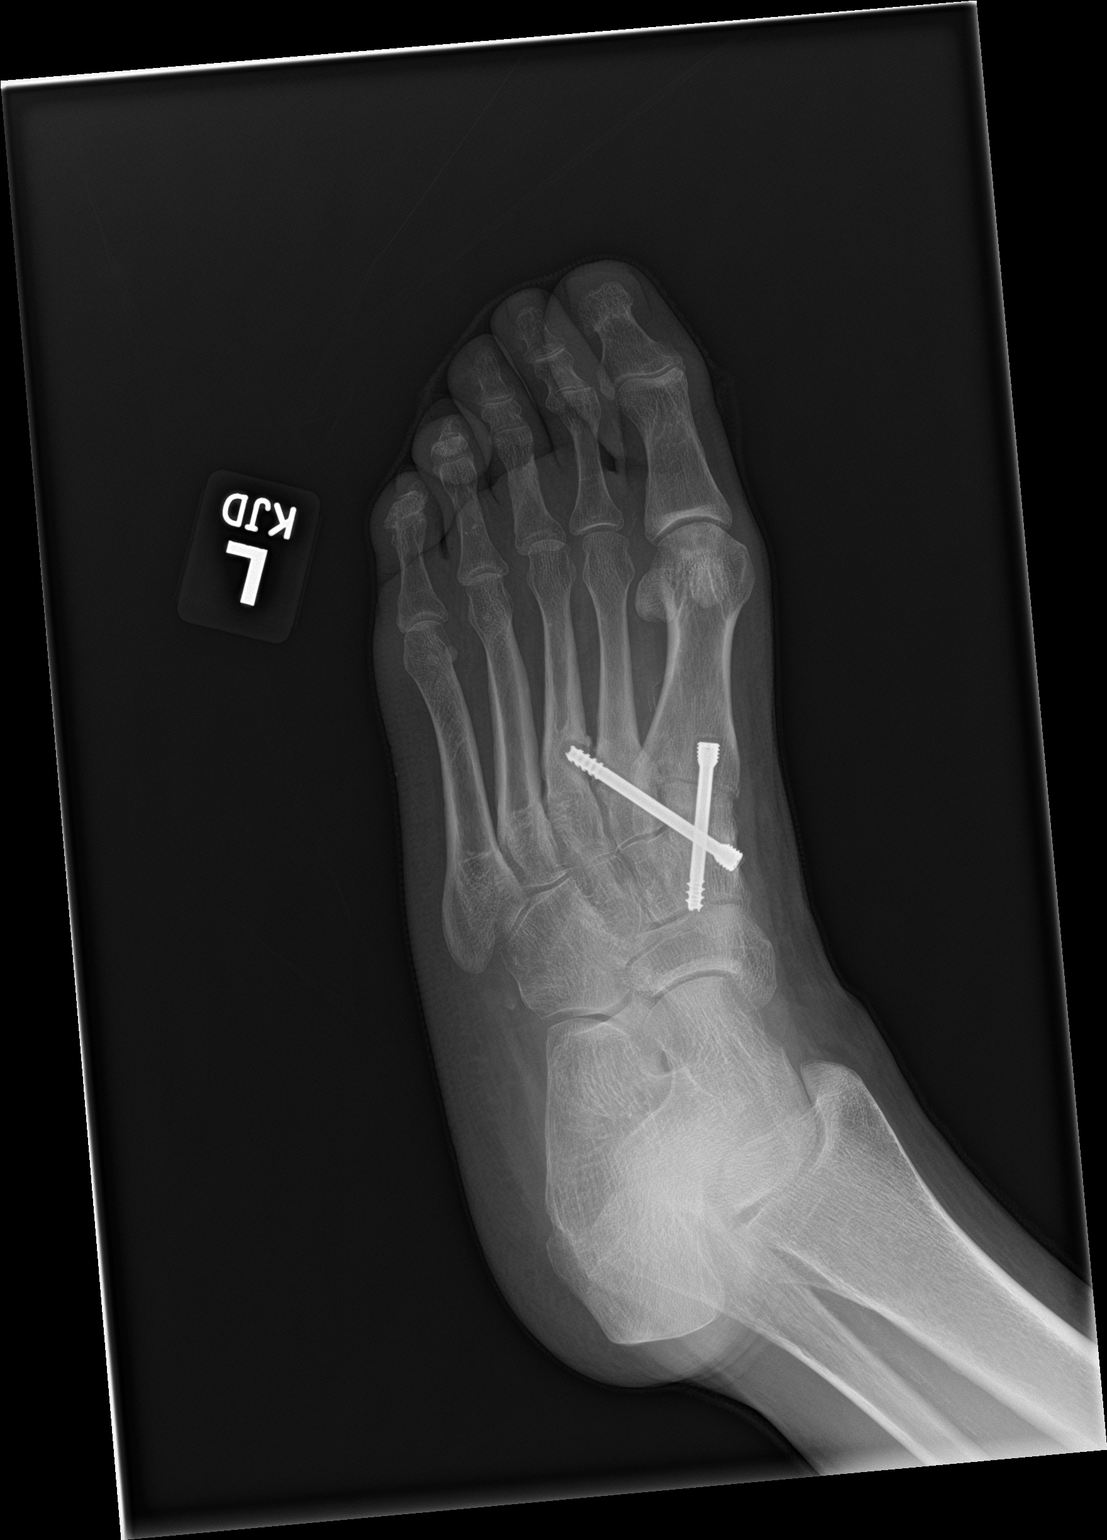

[foot lat]
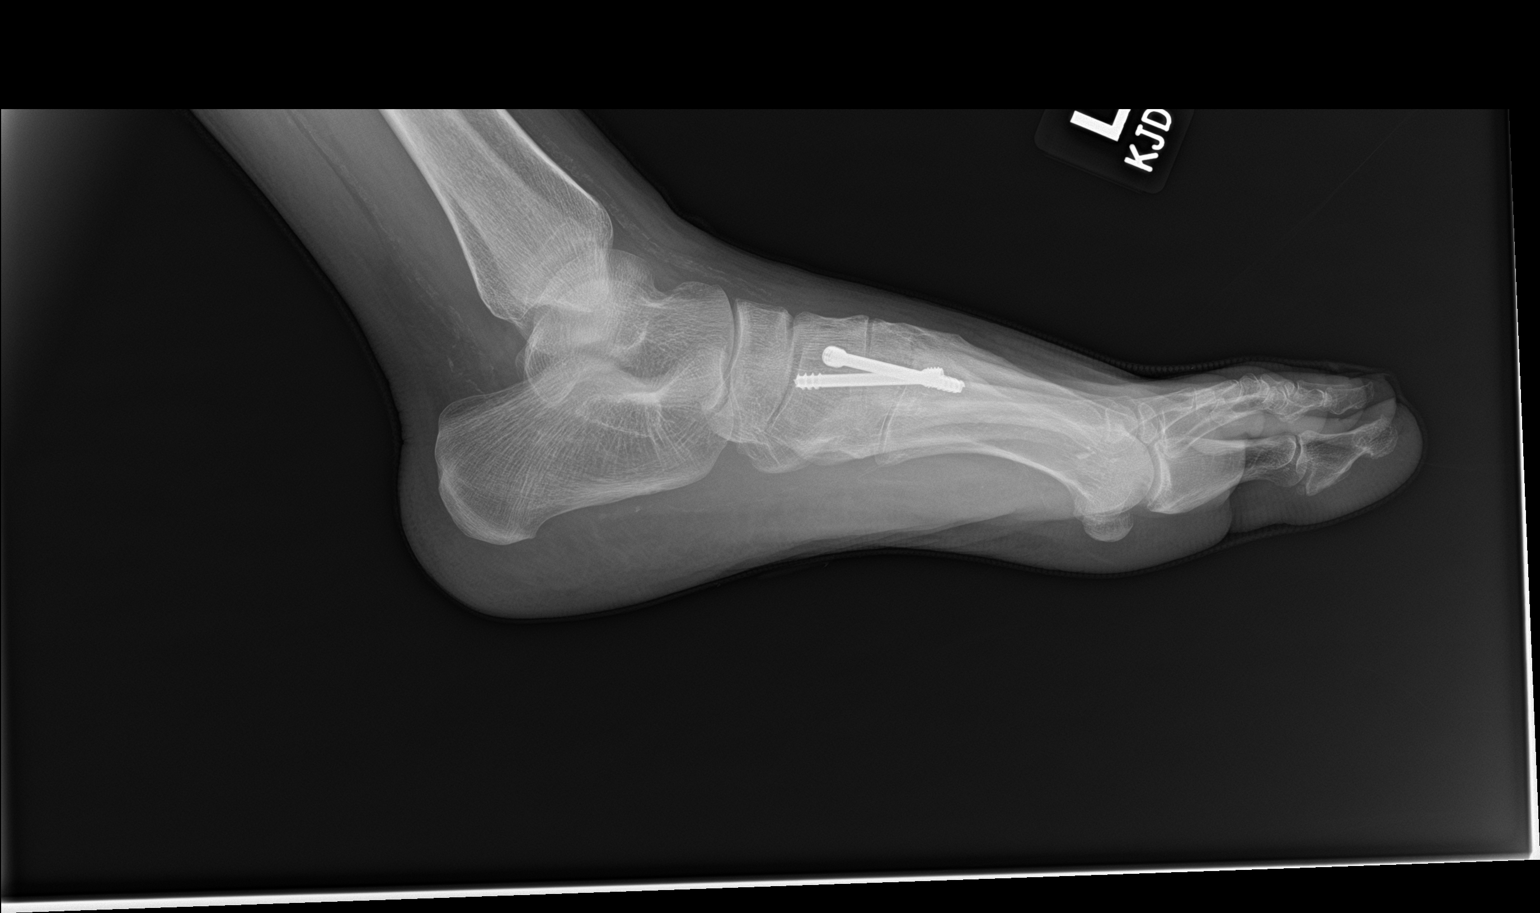

[3 of 3 positions shown; findings below may reference images not displayed]

FINDINGS: Suspected small acute nondisplaced intra-articular corner fracture
of the lateral calcaneus at the calcaneocuboid joint. No additional
acute fracture. Healed fracture of the third proximal phalanx. Prior
Lisfranc ORIF and first TMT joint arthrodesis with unchanged lucency
surrounding the screws. New mild first MTP joint osteoarthritis.
Mild osteopenia. Soft tissues are unremarkable. Vascular
calcifications.
IMPRESSION: 1. Suspected small acute nondisplaced intra-articular corner
fracture of the lateral calcaneus at the calcaneocuboid joint.
2. Prior Lisfranc ORIF and first TMT joint arthrodesis with chronic
screw loosening and no solid fusion.

## 2021-12-19 IMAGING — DX DG CHEST 1V PORT
1 series · 1 of 1 positions shown · non-contrast
Comparison: Portable chest 05/01/2019 and earlier.

CLINICAL DATA: 49-year-old female with cough, shortness of breath
and headache for 3 days.

EXAM:
PORTABLE CHEST 1 VIEW

[chest ap]
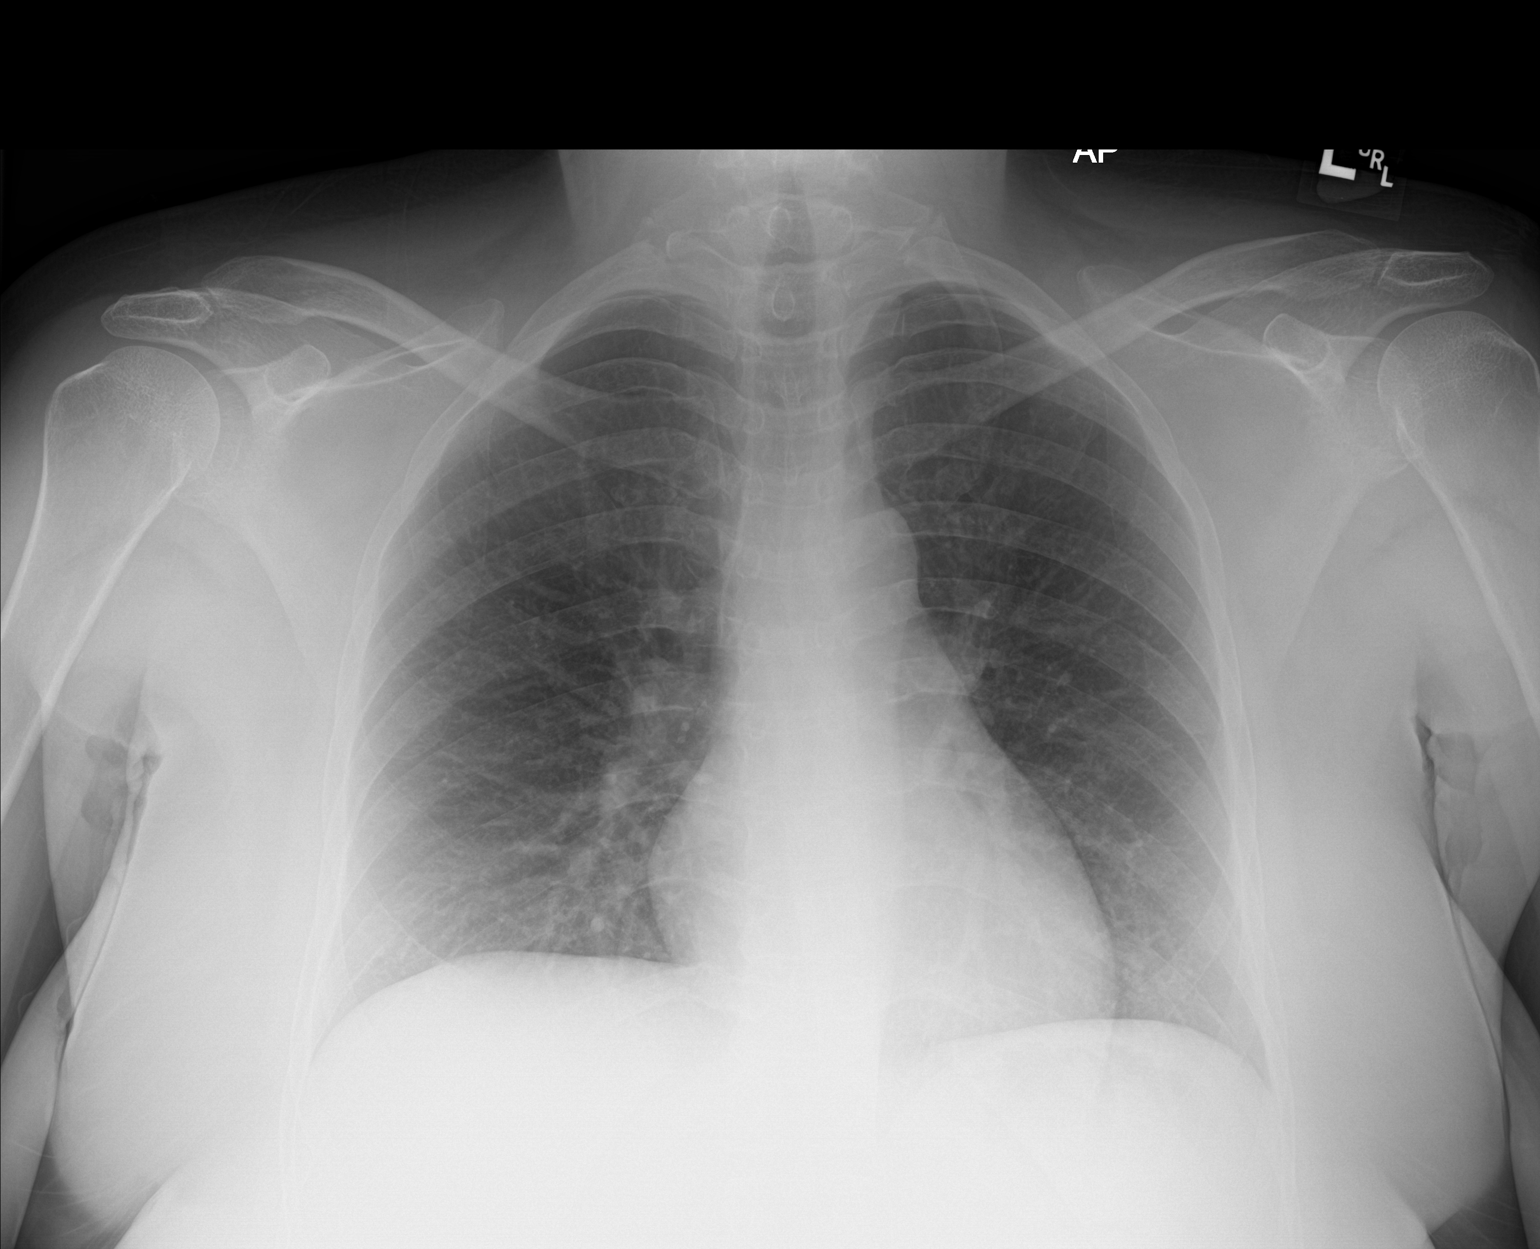

[1 of 1 positions shown; findings below may reference images not displayed]

FINDINGS: Portable AP semi upright view at 5601 hours. Normal lung volumes and
mediastinal contours. Visualized tracheal air column is within
normal limits. Allowing for portable technique the lungs are clear.
No pneumothorax or pleural effusion.

No acute osseous abnormality identified. Small cervical ribs greater
on the left (normal variant).
IMPRESSION: Negative portable chest.
